# Patient Record
Sex: Male | Born: 1950 | Race: Black or African American | Hispanic: No | Marital: Married | Smoking: Never smoker
Health system: Southern US, Community
[De-identification: ages and names within clinical notes are randomized; demographics above are authoritative.]

## PROBLEM LIST (undated history)

## (undated) DIAGNOSIS — Z8781 Personal history of (healed) traumatic fracture: Secondary | ICD-10-CM

## (undated) DIAGNOSIS — Q446 Cystic disease of liver: Secondary | ICD-10-CM

## (undated) DIAGNOSIS — N281 Cyst of kidney, acquired: Secondary | ICD-10-CM

## (undated) DIAGNOSIS — E785 Hyperlipidemia, unspecified: Secondary | ICD-10-CM

## (undated) DIAGNOSIS — Z8669 Personal history of other diseases of the nervous system and sense organs: Secondary | ICD-10-CM

## (undated) DIAGNOSIS — J61 Pneumoconiosis due to asbestos and other mineral fibers: Secondary | ICD-10-CM

## (undated) DIAGNOSIS — M1990 Unspecified osteoarthritis, unspecified site: Secondary | ICD-10-CM

## (undated) DIAGNOSIS — N400 Enlarged prostate without lower urinary tract symptoms: Secondary | ICD-10-CM

## (undated) DIAGNOSIS — G629 Polyneuropathy, unspecified: Secondary | ICD-10-CM

## (undated) DIAGNOSIS — R519 Headache, unspecified: Secondary | ICD-10-CM

## (undated) DIAGNOSIS — C9000 Multiple myeloma not having achieved remission: Secondary | ICD-10-CM

## (undated) DIAGNOSIS — I2699 Other pulmonary embolism without acute cor pulmonale: Secondary | ICD-10-CM

## (undated) DIAGNOSIS — C61 Malignant neoplasm of prostate: Secondary | ICD-10-CM

## (undated) DIAGNOSIS — R51 Headache: Secondary | ICD-10-CM

## (undated) HISTORY — DX: Personal history of other diseases of the nervous system and sense organs: Z86.69

## (undated) HISTORY — DX: Multiple myeloma not having achieved remission: C90.00

## (undated) HISTORY — DX: Hyperlipidemia, unspecified: E78.5

## (undated) HISTORY — DX: Other pulmonary embolism without acute cor pulmonale: I26.99

## (undated) HISTORY — PX: JOINT REPLACEMENT: SHX530

## (undated) HISTORY — DX: Polyneuropathy, unspecified: G62.9

## (undated) HISTORY — DX: Pneumoconiosis due to asbestos and other mineral fibers: J61

## (undated) HISTORY — DX: Benign prostatic hyperplasia without lower urinary tract symptoms: N40.0

## (undated) HISTORY — DX: Malignant neoplasm of prostate: C61

## (undated) HISTORY — DX: Cystic disease of liver: Q44.6

## (undated) HISTORY — DX: Personal history of (healed) traumatic fracture: Z87.81

## (undated) HISTORY — PX: BIOPSY PROSTATE: PRO28

---

## 2002-07-28 ENCOUNTER — Encounter: Payer: Self-pay | Admitting: Internal Medicine

## 2002-07-28 ENCOUNTER — Ambulatory Visit (HOSPITAL_COMMUNITY): Admission: RE | Admit: 2002-07-28 | Discharge: 2002-07-28 | Payer: Self-pay | Admitting: Internal Medicine

## 2003-10-24 ENCOUNTER — Ambulatory Visit (HOSPITAL_COMMUNITY): Admission: RE | Admit: 2003-10-24 | Discharge: 2003-10-24 | Payer: Self-pay | Admitting: Pulmonary Disease

## 2004-03-02 ENCOUNTER — Emergency Department (HOSPITAL_COMMUNITY): Admission: EM | Admit: 2004-03-02 | Discharge: 2004-03-02 | Payer: Self-pay | Admitting: Emergency Medicine

## 2004-07-31 ENCOUNTER — Ambulatory Visit (HOSPITAL_COMMUNITY): Admission: RE | Admit: 2004-07-31 | Discharge: 2004-07-31 | Payer: Self-pay | Admitting: Family Medicine

## 2004-10-29 ENCOUNTER — Ambulatory Visit (HOSPITAL_COMMUNITY): Admission: RE | Admit: 2004-10-29 | Discharge: 2004-10-29 | Payer: Self-pay | Admitting: Family Medicine

## 2004-12-09 ENCOUNTER — Other Ambulatory Visit: Admission: RE | Admit: 2004-12-09 | Discharge: 2004-12-09 | Payer: Self-pay | Admitting: Urology

## 2007-04-19 ENCOUNTER — Encounter (INDEPENDENT_AMBULATORY_CARE_PROVIDER_SITE_OTHER): Payer: Self-pay | Admitting: Specialist

## 2007-10-28 ENCOUNTER — Ambulatory Visit (HOSPITAL_COMMUNITY): Admission: RE | Admit: 2007-10-28 | Discharge: 2007-10-28 | Payer: Self-pay | Admitting: Family Medicine

## 2008-03-06 ENCOUNTER — Ambulatory Visit (HOSPITAL_COMMUNITY): Admission: RE | Admit: 2008-03-06 | Discharge: 2008-03-06 | Payer: Self-pay | Admitting: Otolaryngology

## 2008-11-13 ENCOUNTER — Ambulatory Visit (HOSPITAL_COMMUNITY): Admission: RE | Admit: 2008-11-13 | Discharge: 2008-11-13 | Payer: Self-pay | Admitting: Family Medicine

## 2008-11-28 ENCOUNTER — Encounter: Payer: Self-pay | Admitting: Internal Medicine

## 2008-11-28 ENCOUNTER — Ambulatory Visit (HOSPITAL_COMMUNITY): Admission: RE | Admit: 2008-11-28 | Discharge: 2008-11-28 | Payer: Self-pay | Admitting: Internal Medicine

## 2008-11-28 ENCOUNTER — Ambulatory Visit: Payer: Self-pay | Admitting: Internal Medicine

## 2008-11-28 HISTORY — PX: COLONOSCOPY: SHX174

## 2011-05-06 NOTE — Op Note (Signed)
Luis, Stewart           ACCOUNT NO.:  [id number]   MEDICAL RECORD NO.:  [id number]          PATIENT TYPE:  AMB   LOCATION:  DAY                           FACILITY:  [facility]   PHYSICIAN:  R. Roetta Sessions, M.D. DATE OF BIRTH:  10-Aug-1951   DATE OF PROCEDURE:  11/28/2008  DATE OF DISCHARGE:                               OPERATIVE REPORT   PROCEDURE:  Colonoscopy with biopsy, snare polypectomy.   INDICATIONS FOR PROCEDURE:  A 60 year old gentleman referred by Dr.  Butch Penny for colorectal cancer screening.  He had a colonoscopy in  1999 for iron-deficiency anemia by Dr. Karilyn Cota.  He is found to have a  tortuous elongated colon.  Colonoscopy is now being done.  Risks,  benefits, alternatives, and limitations have been reviewed, questions  answered.  Please see the documentation in the medical record.  As noted  above, Luis Stewart is notable for lower GI tract symptoms.  Currently,  there is no family history of colonic polyps or colon cancer.   PROCEDURE NOTE:  O2 saturation, blood pressure, pulse, and respirations  monitored throughout the entire procedure.   CONSCIOUS SEDATION:  Versed 5 mg IV, Demerol 100 mg IV in divided doses.   INSTRUMENT:  Pentax video chip system.   FINDINGS:  Digital rectal exam revealed no abnormalities. Endoscopic  findings:  The prep was good.  Colon:  The colonic mucosa was surveyed  from the rectosigmoid junction through the left transverse, right colon  to the appendiceal orifice, ileocecal valve, and cecum.  These  structures were well seen and photographed for the record.  From this  level, scope was slowly and cautiously withdrawn.  All previously  mentioned mucosal surfaces were again seen.  The patient is noted to  have a diminutive polyp in mid ascending colon and was cold  biopsy/removed.  Adjacent to this polyp, there was 1-cm pedunculated  polyp, which was hot snared with one pass and recovered.  The remainder  of the colonic  mucosa appeared normal, although the colonoscope was  somewhat elongated and tortuous.  Otherwise, it appeared normal.  Scope  was pulled down the rectum where thorough examination of the rectal  mucosa including retroflexed view of the anal verge demonstrated some  internal hemorrhoids only.  The patient tolerated the procedure well and  was reactive to Endoscopy.   IMPRESSION:  Internal hemorrhoids, otherwise normal rectum, somewhat  tortuous colon, ascending colon polyps removed as described above.  Remainder of colonic mucosa appeared unremarkable.   RECOMMENDATIONS:  1. No aspirin or arthritis medications for 5 days.  2. Followup on path.  3. Further recommendations to follow.      Luis Stewart, M.D.  Electronically Signed     RMR/MEDQ  D:  11/28/2008  T:  11/28/2008  Job:  [id number]   cc:   Angus G. Renard Matter, MD  Fax: [phone]

## 2011-05-09 NOTE — Procedures (Signed)
   NAME:  Luis Stewart, Luis Stewart                     ACCOUNT NO.:  [id number]   MEDICAL RECORD NO.:  [id number]                   PATIENT TYPE:  OUT   LOCATION:  DFTL                                 FACILITY:  [facility]   PHYSICIAN:  Edward L. Juanetta Gosling, M.D.             DATE OF BIRTH:  August 16, 1951   DATE OF PROCEDURE:  10/24/2003  DATE OF DISCHARGE:                                    STRESS TEST   INDICATIONS FOR PROCEDURE:  Mr. Ruscitti is undergoing graded exercise  testing because he did have some chest discomfort.  He also was undergoing  routine physical when he discussed this with Dr. Renard Matter.  Dr. Renard Matter set  him up for a stress test.  There are no contraindications to graded exercise  testing.   Mr. Reisig exercised for nine minutes on the Bruce protocol, reaching and  sustaining 10.1 METs.  His maximum recorded heart rate was 156 which is 93%  of his age-predicted maximal heart rate.  He had no symptoms during exercise  and had no electrocardiographic changes suggestive of inducible ischemia.  His blood pressure response to exercise was normal.   IMPRESSION:  1. Good exercise tolerance.  2. No evidence of inducible ischemia.  3. Normal blood pressure response to exercise.  4. No symptoms with exercise.      ___________________________________________                                            Oneal Deputy. Juanetta Gosling, M.D.   ELH/MEDQ  D:  10/24/2003  T:  10/24/2003  Job:  [id number]   cc:   Angus G. Renard Matter, M.D.  [address]  [place]  [place] [address]  Fax: [phone]

## 2012-12-03 ENCOUNTER — Other Ambulatory Visit (HOSPITAL_COMMUNITY): Payer: Self-pay | Admitting: Family Medicine

## 2012-12-03 ENCOUNTER — Ambulatory Visit (HOSPITAL_COMMUNITY)
Admission: RE | Admit: 2012-12-03 | Discharge: 2012-12-03 | Disposition: A | Discharge status: Home / Self Care | Payer: Self-pay | Source: Ambulatory Visit | Attending: Family Medicine | Admitting: Family Medicine

## 2012-12-03 DIAGNOSIS — R0789 Other chest pain: Secondary | ICD-10-CM

## 2012-12-03 DIAGNOSIS — R079 Chest pain, unspecified: Secondary | ICD-10-CM | POA: Insufficient documentation

## 2013-05-18 ENCOUNTER — Telehealth: Payer: Self-pay | Admitting: Family Medicine

## 2013-05-26 NOTE — Telephone Encounter (Signed)
Pt going to do labs day of appt according to appt notes

## 2013-05-30 ENCOUNTER — Encounter: Payer: Self-pay | Admitting: Family Medicine

## 2013-05-30 ENCOUNTER — Ambulatory Visit (INDEPENDENT_AMBULATORY_CARE_PROVIDER_SITE_OTHER): Payer: BC Managed Care – PPO | Admitting: Family Medicine

## 2013-05-30 VITALS — BP 120/76 | HR 84 | Temp 97.8°F | Resp 16 | Ht 72.5 in | Wt 185.0 lb

## 2013-05-30 DIAGNOSIS — D72819 Decreased white blood cell count, unspecified: Secondary | ICD-10-CM

## 2013-05-30 DIAGNOSIS — E785 Hyperlipidemia, unspecified: Secondary | ICD-10-CM

## 2013-05-30 DIAGNOSIS — Z2911 Encounter for prophylactic immunotherapy for respiratory syncytial virus (RSV): Secondary | ICD-10-CM

## 2013-05-30 DIAGNOSIS — Z0000 Encounter for general adult medical examination without abnormal findings: Secondary | ICD-10-CM

## 2013-05-30 DIAGNOSIS — N400 Enlarged prostate without lower urinary tract symptoms: Secondary | ICD-10-CM

## 2013-05-30 DIAGNOSIS — Z23 Encounter for immunization: Secondary | ICD-10-CM

## 2013-05-30 DIAGNOSIS — R972 Elevated prostate specific antigen [PSA]: Secondary | ICD-10-CM

## 2013-05-30 LAB — CBC
Hemoglobin: 15.1 g/dL (ref 13.0–17.0)
MCH: 30.4 pg (ref 26.0–34.0)
MCHC: 34.9 g/dL (ref 30.0–36.0)
MCV: 87.1 fL (ref 78.0–100.0)
Platelets: 149 10*3/uL — ABNORMAL LOW (ref 150–400)
RDW: 15.2 % (ref 11.5–15.5)

## 2013-05-30 LAB — LIPID PANEL
Cholesterol: 197 mg/dL (ref 0–200)
LDL Cholesterol: 133 mg/dL — ABNORMAL HIGH (ref 0–99)
Total CHOL/HDL Ratio: 3.9 Ratio
Triglycerides: 66 mg/dL (ref <150)
VLDL: 13 mg/dL (ref 0–40)

## 2013-05-30 LAB — COMPREHENSIVE METABOLIC PANEL
ALT: 11 U/L (ref 0–53)
AST: 17 U/L (ref 0–37)
Albumin: 4.2 g/dL (ref 3.5–5.2)
BUN: 17 mg/dL (ref 6–23)
Calcium: 9.5 mg/dL (ref 8.4–10.5)
Chloride: 106 mEq/L (ref 96–112)
Creat: 1.22 mg/dL (ref 0.50–1.35)
Potassium: 5 mEq/L (ref 3.5–5.3)
Sodium: 144 mEq/L (ref 135–145)
Total Bilirubin: 0.6 mg/dL (ref 0.3–1.2)
Total Protein: 7 g/dL (ref 6.0–8.3)

## 2013-05-30 NOTE — Patient Instructions (Addendum)
I recommend eye visit once a year I recommend dental visit every 6 months Goal is to  Exercise 30 minutes 5 days a week We will call with lab results  Labs done today  Shingles vaccine Recommend Multivitamin for men F/U 1 year

## 2013-06-01 ENCOUNTER — Encounter: Payer: Self-pay | Admitting: Family Medicine

## 2013-06-01 DIAGNOSIS — Z01818 Encounter for other preprocedural examination: Secondary | ICD-10-CM | POA: Insufficient documentation

## 2013-06-01 DIAGNOSIS — Z0001 Encounter for general adult medical examination with abnormal findings: Secondary | ICD-10-CM | POA: Insufficient documentation

## 2013-06-01 DIAGNOSIS — E785 Hyperlipidemia, unspecified: Secondary | ICD-10-CM | POA: Insufficient documentation

## 2013-06-01 DIAGNOSIS — Z0000 Encounter for general adult medical examination without abnormal findings: Secondary | ICD-10-CM | POA: Insufficient documentation

## 2013-06-01 DIAGNOSIS — N400 Enlarged prostate without lower urinary tract symptoms: Secondary | ICD-10-CM | POA: Insufficient documentation

## 2013-06-01 NOTE — Assessment & Plan Note (Addendum)
Complete physical done today. Shingles vaccine given other immunizations up-to-date. He's had a colonoscopy Recommend MVI with calcium and Vit D Increase exercise See instructions

## 2013-06-01 NOTE — Progress Notes (Signed)
  Subjective:    Patient ID: Luis Stewart, male    DOB: 1951-01-24, 62 y.o.   MRN: [id number]  HPI  Patient here to establish care. Previous PCP Dr. Megan Mans. Urologist - Dr. Jerre Simon, [facility] Medications and history reviewed He is a history of BPH he is currently being treated by urology he also has had quite elevated PSA measurements he's had prostate biopsies there is no sign of cancer however his PSA stay elevated around 12. He also has a history of mild hyperlipidemia he was on medications in the past for the past couple of years has not taken anything. He also has a history of neuropathy in his feet. He was followed by Dr. Gerilyn Pilgrim neurology he's had nerve conduction studies done and has been tried on medications but nothing has helped. The past 2 years he has not used anything His last physical exam and routine labs were done in 2011- He did not have insurance past 2 years  Currently works part time in business for a  [employer] Previously worked at [employer] reviewed he's not had the zoster vaccine Colonoscopy is up to date  Followed by Education officer, community No eye insurance   Review of Systems  GEN- denies fatigue, fever, weight loss,weakness, recent illness HEENT- denies eye drainage, change in vision, nasal discharge, CVS- denies chest pain, palpitations RESP- denies SOB, cough, wheeze ABD- denies N/V, change in stools, abd pain GU- denies dysuria, hematuria, dribbling, incontinence MSK- denies joint pain, muscle aches, injury Neuro- denies headache, dizziness, syncope, seizure activity      Objective:   Physical Exam GEN- NAD, alert and oriented x3 HEENT- PERRL, EOMI, non injected sclera, pink conjunctiva, MMM, oropharynx clear Neck- Supple,no LAD CVS- RRR, no murmur RESP-CTAB ABD-NABS,soft,NT,ND EXT- No edema GU- defferred  Pulses- Radial, DP- 2+ Psych- normal affect and mood        Assessment & Plan:

## 2013-06-01 NOTE — Assessment & Plan Note (Signed)
Followed by urology. Records obtained. No current medication

## 2013-06-01 NOTE — Assessment & Plan Note (Signed)
He's had elevated PSA for many years now ranging from 12-16. He will continue to follow with urology every 3-4 months

## 2013-06-01 NOTE — Assessment & Plan Note (Signed)
Check fasting lipid panel, current medication

## 2013-06-03 ENCOUNTER — Other Ambulatory Visit (INDEPENDENT_AMBULATORY_CARE_PROVIDER_SITE_OTHER): Payer: Self-pay

## 2013-06-03 DIAGNOSIS — D72819 Decreased white blood cell count, unspecified: Secondary | ICD-10-CM

## 2013-06-03 NOTE — Addendum Note (Signed)
Addended by: Elvina Mattes T on: 06/03/2013 08:46 AM   Modules accepted: Orders

## 2013-06-04 LAB — CBC WITH DIFFERENTIAL/PLATELET
Basophils Absolute: 0 10*3/uL (ref 0.0–0.1)
Basophils Relative: 1 % (ref 0–1)
Eosinophils Relative: 2 % (ref 0–5)
HCT: 41.1 % (ref 39.0–52.0)
Hemoglobin: 14 g/dL (ref 13.0–17.0)
Lymphocytes Relative: 38 % (ref 12–46)
Lymphs Abs: 1.2 10*3/uL (ref 0.7–4.0)
MCH: 29.2 pg (ref 26.0–34.0)
MCHC: 34.1 g/dL (ref 30.0–36.0)
MCV: 85.6 fL (ref 78.0–100.0)
Monocytes Absolute: 0.2 10*3/uL (ref 0.1–1.0)
Monocytes Relative: 6 % (ref 3–12)
Neutro Abs: 1.7 10*3/uL (ref 1.7–7.7)
Neutrophils Relative %: 53 % (ref 43–77)
Platelets: 150 10*3/uL (ref 150–400)
RBC: 4.8 MIL/uL (ref 4.22–5.81)
RDW: 15.4 % (ref 11.5–15.5)
WBC: 3.1 10*3/uL — ABNORMAL LOW (ref 4.0–10.5)

## 2013-06-08 ENCOUNTER — Encounter: Payer: Self-pay | Admitting: Family Medicine

## 2013-06-13 ENCOUNTER — Encounter: Payer: Self-pay | Admitting: Family Medicine

## 2013-06-27 ENCOUNTER — Encounter: Payer: Self-pay | Admitting: Family Medicine

## 2013-10-27 ENCOUNTER — Other Ambulatory Visit: Payer: Self-pay

## 2013-11-22 ENCOUNTER — Encounter: Payer: Self-pay | Admitting: Internal Medicine

## 2013-12-08 ENCOUNTER — Ambulatory Visit (INDEPENDENT_AMBULATORY_CARE_PROVIDER_SITE_OTHER): Payer: BC Managed Care – PPO | Admitting: Physician Assistant

## 2013-12-08 ENCOUNTER — Encounter: Payer: Self-pay | Admitting: Physician Assistant

## 2013-12-08 VITALS — BP 114/70 | HR 68 | Temp 98.7°F | Resp 18 | Wt 181.0 lb

## 2013-12-08 DIAGNOSIS — B9789 Other viral agents as the cause of diseases classified elsewhere: Secondary | ICD-10-CM

## 2013-12-08 NOTE — Progress Notes (Signed)
    Patient ID: Luis Stewart MRN: [id number], DOB: [birth year], 62 y.o. Date of Encounter: 12/08/2013, 8:53 AM    Chief Complaint:  Chief Complaint  Patient presents with  . c/o sinus infection    congestion, achy     HPI: 62 y.o. year old AA male says that his symptoms just started on the night of 12/06/13. He points to the bridge of his nose and says that the area started to feel sore and he also had some sore throat. Says that his throat was quite sore on the night of 12/06/13 and even yesterday morning. However he says that since then the sore throat has gotten much better. He has also had some congestion. However he says that what he is blowing out of his nose is clear. Thinks he has some chest congestion with some cough. Has had no fevers or chills. He's been using some Mucinex. No known sick contacts.     Home Meds: See attached medication section for any medications that were entered at today's visit. The computer does not put those onto this list.The following list is a list of meds entered prior to today's visit.   No current outpatient prescriptions on file prior to visit.   No current facility-administered medications on file prior to visit.    Allergies: No Known Allergies    Review of Systems: See HPI for pertinent ROS. All other ROS negative.    Physical Exam: Blood pressure 114/70, pulse 68, temperature 98.7 F (37.1 C), temperature source Oral, resp. rate 18, weight 181 lb (82.101 kg)., Body mass index is 24.2 kg/(m^2). General: WNWD AAM. Appears in no acute distress. HEENT: Normocephalic, atraumatic, eyes without discharge, sclera non-icteric, nares are without discharge. Bilateral auditory canals clear, TM's are without perforation, pearly grey and translucent with reflective cone of light bilaterally. Oral cavity moist, posterior pharynx without exudate, erythema, peritonsillar abscess, or post nasal drip. No tenderness with percussion of the  sinuses.  Neck: Supple. No thyromegaly. No lymphadenopathy. Lungs: Clear bilaterally to auscultation without wheezes, rales, or rhonchi. Breathing is unlabored. Heart: Regular rhythm. No murmurs, rubs, or gallops. Msk:  Strength and tone normal for age. Extremities/Skin: Warm and dry. No clubbing or cyanosis. No edema. No rashes or suspicious lesions. Neuro: Alert and oriented X 3. Moves all extremities spontaneously. Gait is normal. CNII-XII grossly in tact. Psych:  Responds to questions appropriately with a normal affect.     ASSESSMENT AND PLAN:  62 y.o. year old male with  1. Viral respiratory infection Recommend over-the-counter decongestants and cough medications for symptomatic relief. Hopefully this should run its course and resolve on its own. If he develops significant fever or if the symptoms worsen significantly or persist greater than 7 days without resolution, then call us to f/u.   Signed, [address] [place], [place], [facility] 12/08/2013 8:53 AM

## 2013-12-22 DIAGNOSIS — C61 Malignant neoplasm of prostate: Secondary | ICD-10-CM

## 2013-12-22 HISTORY — DX: Malignant neoplasm of prostate: C61

## 2014-03-07 LAB — PULMONARY FUNCTION TEST

## 2014-06-01 ENCOUNTER — Other Ambulatory Visit: Payer: BC Managed Care – PPO

## 2014-06-01 ENCOUNTER — Other Ambulatory Visit: Payer: Self-pay | Admitting: Family Medicine

## 2014-06-01 DIAGNOSIS — Z79899 Other long term (current) drug therapy: Secondary | ICD-10-CM

## 2014-06-01 DIAGNOSIS — N400 Enlarged prostate without lower urinary tract symptoms: Secondary | ICD-10-CM

## 2014-06-01 DIAGNOSIS — Z0000 Encounter for general adult medical examination without abnormal findings: Secondary | ICD-10-CM

## 2014-06-01 DIAGNOSIS — E785 Hyperlipidemia, unspecified: Secondary | ICD-10-CM

## 2014-06-01 DIAGNOSIS — R972 Elevated prostate specific antigen [PSA]: Principal | ICD-10-CM

## 2014-06-01 LAB — COMPLETE METABOLIC PANEL WITH GFR
ALT: 13 U/L (ref 0–53)
AST: 19 U/L (ref 0–37)
Albumin: 3.9 g/dL (ref 3.5–5.2)
Alkaline Phosphatase: 98 U/L (ref 39–117)
BILIRUBIN TOTAL: 0.4 mg/dL (ref 0.2–1.2)
BUN: 14 mg/dL (ref 6–23)
CALCIUM: 9 mg/dL (ref 8.4–10.5)
CHLORIDE: 104 meq/L (ref 96–112)
CO2: 29 mEq/L (ref 19–32)
Creat: 1.11 mg/dL (ref 0.50–1.35)
GFR, Est African American: 81 mL/min
GFR, Est Non African American: 70 mL/min
Glucose, Bld: 92 mg/dL (ref 70–99)
Potassium: 4.3 mEq/L (ref 3.5–5.3)
Sodium: 142 mEq/L (ref 135–145)
Total Protein: 6.4 g/dL (ref 6.0–8.3)

## 2014-06-01 LAB — CBC WITH DIFFERENTIAL/PLATELET
Basophils Absolute: 0 10*3/uL (ref 0.0–0.1)
Basophils Relative: 1 % (ref 0–1)
Eosinophils Absolute: 0.1 10*3/uL (ref 0.0–0.7)
Eosinophils Relative: 4 % (ref 0–5)
HCT: 41.8 % (ref 39.0–52.0)
Hemoglobin: 14.6 g/dL (ref 13.0–17.0)
Lymphocytes Relative: 45 % (ref 12–46)
Lymphs Abs: 1.5 10*3/uL (ref 0.7–4.0)
MCH: 30.4 pg (ref 26.0–34.0)
MCHC: 34.9 g/dL (ref 30.0–36.0)
MCV: 87.1 fL (ref 78.0–100.0)
Monocytes Absolute: 0.2 10*3/uL (ref 0.1–1.0)
Monocytes Relative: 7 % (ref 3–12)
Neutro Abs: 1.5 10*3/uL — ABNORMAL LOW (ref 1.7–7.7)
Neutrophils Relative %: 43 % (ref 43–77)
PLATELETS: 164 10*3/uL (ref 150–400)
RBC: 4.8 MIL/uL (ref 4.22–5.81)
RDW: 15.3 % (ref 11.5–15.5)
WBC: 3.4 10*3/uL — ABNORMAL LOW (ref 4.0–10.5)

## 2014-06-01 LAB — LIPID PANEL
Cholesterol: 190 mg/dL (ref 0–200)
HDL: 44 mg/dL (ref >39)
LDL Cholesterol: 123 mg/dL — ABNORMAL HIGH (ref 0–99)
Total CHOL/HDL Ratio: 4.3 Ratio
Triglycerides: 113 mg/dL (ref <150)
VLDL: 23 mg/dL (ref 0–40)

## 2014-06-01 LAB — TSH: TSH: 4.672 u[IU]/mL — ABNORMAL HIGH (ref 0.350–4.500)

## 2014-06-02 LAB — T3: T3, Total: 139.8 ng/dL (ref 80.0–204.0)

## 2014-06-02 LAB — VITAMIN D 25 HYDROXY (VIT D DEFICIENCY, FRACTURES): Vit D, 25-Hydroxy: 26 ng/mL — ABNORMAL LOW (ref 30–89)

## 2014-06-02 LAB — T4, FREE: Free T4: 1.07 ng/dL (ref 0.80–1.80)

## 2014-06-05 ENCOUNTER — Ambulatory Visit (INDEPENDENT_AMBULATORY_CARE_PROVIDER_SITE_OTHER): Payer: BC Managed Care – PPO | Admitting: Family Medicine

## 2014-06-05 ENCOUNTER — Encounter: Payer: Self-pay | Admitting: Family Medicine

## 2014-06-05 VITALS — BP 132/78 | HR 60 | Temp 97.8°F | Resp 12 | Ht 74.0 in | Wt 186.0 lb

## 2014-06-05 DIAGNOSIS — E785 Hyperlipidemia, unspecified: Secondary | ICD-10-CM

## 2014-06-05 DIAGNOSIS — D126 Benign neoplasm of colon, unspecified: Secondary | ICD-10-CM

## 2014-06-05 DIAGNOSIS — E559 Vitamin D deficiency, unspecified: Secondary | ICD-10-CM

## 2014-06-05 DIAGNOSIS — N400 Enlarged prostate without lower urinary tract symptoms: Secondary | ICD-10-CM

## 2014-06-05 DIAGNOSIS — R972 Elevated prostate specific antigen [PSA]: Secondary | ICD-10-CM

## 2014-06-05 DIAGNOSIS — K635 Polyp of colon: Secondary | ICD-10-CM

## 2014-06-05 DIAGNOSIS — Z1211 Encounter for screening for malignant neoplasm of colon: Secondary | ICD-10-CM

## 2014-06-05 DIAGNOSIS — Z0000 Encounter for general adult medical examination without abnormal findings: Secondary | ICD-10-CM

## 2014-06-05 DIAGNOSIS — M2550 Pain in unspecified joint: Secondary | ICD-10-CM

## 2014-06-05 MED ORDER — VITAMIN D (ERGOCALCIFEROL) 1.25 MG (50000 UNIT) PO CAPS: 50000.0000 [IU] | ORAL_CAPSULE | ORAL | Status: DC

## 2014-06-05 NOTE — Assessment & Plan Note (Signed)
He has a mild migrating joint pain which is recent. We discussed trying oral NSAIDs however he refers to tried topical first. If his joint pains do not improve I would image him get a rheumatoid factor

## 2014-06-05 NOTE — Assessment & Plan Note (Signed)
Enlarged prostate and elevated PSA however no urinary symptoms accompanying therefore we will not start any medications

## 2014-06-05 NOTE — Assessment & Plan Note (Signed)
ERGOCALCIFEROL  X 12 WEEKS

## 2014-06-05 NOTE — Assessment & Plan Note (Signed)
Lipids look good, no meds needed

## 2014-06-05 NOTE — Patient Instructions (Signed)
Referral for colonoscopy Vitamin D for 12 weeks, then resume your multivitamin, make sure at least Vitamin D 1000iu  We will call about the Thyroid levels  Try aspercreme for the joints or biofreeze  Call if not improved I recommend eye visit once a year I recommend dental visit every 6 months Goal is to  Exercise 30 minutes 5 days a week F/U as needed or in 1 year

## 2014-06-05 NOTE — Assessment & Plan Note (Signed)
Physical done. Refer for repeat colonoscopy Fasting labs reviewed at bedside Schedule ophthalmology visit

## 2014-06-05 NOTE — Progress Notes (Signed)
Patient ID: Luis Stewart, male   DOB: Sep 03, 1951, 63 y.o.   MRN: [id number]   Subjective:    Patient ID: Luis Stewart, male    DOB: 10-Sep-1951, 63 y.o.   MRN: [id number]  Patient presents for CPE and Discuss Referrals  patient here for physical exam. He was seen by pulmonology secondary to an abnormal chest x-ray as he is currently under surveillance for spasticity to some exposure with his previous job. His chest x-ray was concerning for interstitial markings and signs of asbestos he then had pulmonary function tests which showed decreased diffusion capacity there was concern for asbestos in him. He will have chest x-ray yearly to monitor for any signs of mesothelioma and will follow pulmonary closely. There were no medication changes.  Colonoscopy had a colonoscopy done in December 2009 which had some polyps he is due for repeat colonoscopy.  Elevated PSA he's been followed by urology over the past 10 years secondary to elevated PSA he did have a biopsy done in 2005 which was benign he was recently seen about 3 months ago.  He also complains of migrating joint pain. He's had bilateral knee and leg pain for many years he was seen by neurology for this told that he had a nonspecific nerve damage. He also gets pain in his neck from time to time in both films. He's had some mild swelling in his thumbs he did not take any over-the-counter medication and the pain to resolve on her own.    Review Of Systems:  GEN- denies fatigue, fever, weight loss,weakness, recent illness HEENT- denies eye drainage, change in vision, nasal discharge, CVS- denies chest pain, palpitations RESP- denies SOB, cough, wheeze ABD- denies N/V, change in stools, abd pain GU- denies dysuria, hematuria, dribbling, incontinence MSK- denies joint pain, muscle aches, injury Neuro- denies headache, dizziness, syncope, seizure activity       Objective:    BP 132/78  Pulse 60  Temp(Src) 97.8 F (36.6 C)  (Oral)  Resp 12  Ht 6\' 2"  (1.88 m)  Wt 186 lb (84.369 kg)  BMI 23.87 kg/m2 GEN- NAD, alert and oriented x3 HEENT- PERRL, EOMI, non injected sclera, pink conjunctiva, MMM, oropharynx clear, TM mild wax bilat Neck- Supple, no thyromegaly CVS- RRR, no murmur RESP-CTAB ABD-NABS,soft,NT,ND GU- Normal rectal tone, external skin tag, FOBT 1 speck positive, soft brown stool in vault, enlarged prostate, no nodules MSK- FROM bilat knee,s +crepitus, normal inspection for effusion  EXT- No edema Pulses- Radial, DP- 2+        Assessment & Plan:      Problem List Items Addressed This Visit   Routine general medical examination at a health care facility   Mild hyperlipidemia   Elevated PSA measurement   BPH (benign prostatic hypertrophy) - Primary      Note: This dictation was prepared with Dragon dictation along with smaller phrase technology. Any transcriptional errors that result from this process are unintentional.

## 2014-06-26 ENCOUNTER — Telehealth: Payer: Self-pay | Admitting: General Practice

## 2014-06-26 NOTE — Telephone Encounter (Signed)
Patient received a letter from you and wanted to get set up for his tcs, please call him on his cell phone.  Routing to [employer]

## 2014-06-27 ENCOUNTER — Telehealth: Payer: Self-pay

## 2014-06-27 NOTE — Telephone Encounter (Signed)
Received the colonoscopy report and path of 11/28/2008. Pt did have a tubular adenoma and was on recall for 2014. He needs OV prior to scheduling.

## 2014-06-27 NOTE — Telephone Encounter (Signed)
Returned pt's Call to schedule next colonoscopy.

## 2014-06-27 NOTE — Telephone Encounter (Signed)
See separate triage.  

## 2014-06-27 NOTE — Telephone Encounter (Signed)
Pt said he had colonoscopy in 2014 by Dr. Gala Romney. Nothing in E-chart. Called Erica in Medical Records and she will fax it to me.

## 2014-06-28 NOTE — Telephone Encounter (Signed)
Pt is scheduled for an ov with Laban Emperor, NP on 08/08/2014 at 2:30 PM .

## 2014-08-08 ENCOUNTER — Encounter: Payer: Self-pay | Admitting: Gastroenterology

## 2014-08-08 ENCOUNTER — Ambulatory Visit (INDEPENDENT_AMBULATORY_CARE_PROVIDER_SITE_OTHER): Payer: BC Managed Care – PPO | Admitting: Gastroenterology

## 2014-08-08 VITALS — BP 134/79 | HR 63 | Temp 97.6°F | Ht 74.0 in | Wt 184.6 lb

## 2014-08-08 DIAGNOSIS — Z860100 Personal history of colon polyps, unspecified: Secondary | ICD-10-CM | POA: Insufficient documentation

## 2014-08-08 MED ORDER — PEG 3350-KCL-NA BICARB-NACL 420 G PO SOLR: 4000.0000 mL | ORAL | Status: DC

## 2014-08-08 NOTE — Progress Notes (Signed)
Primary Care Physician:  Vic Blackbird, MD Primary Gastroenterologist:  Dr. Gala Romney  Chief Complaint  Patient presents with  . Colonoscopy    HPI:   Luis Stewart is a very pleasant 63 year old male who presents with need for surveillance colonoscopy due to history of adenomatous colon polyps.   Sometimes lower abdominal discomfort but relieved after defecation. No hematochezia. Has never been regular where he goes every day. Sometimes a tad constipated. Loves cashews, which helps. Declining prescriptive medications. No upper GI symptoms.   Past Medical History  Diagnosis Date  . Enlarged prostate   . Broken bones     lower rt leg- Child  . Hx of migraine headaches   . Neuromuscular disorder     neuropathy  . Hyperlipidemia     borderline  . Asbestosis     Past Surgical History  Procedure Laterality Date  . Biopsy prostate  2003 and 2005  . Colonoscopy  11/28/2008    EUM:PNTIRWER hemorrhoids/tortus colon/ascending colon polyps    Current Outpatient Prescriptions  Medication Sig Dispense Refill  . Multiple Vitamins-Minerals (MULTIVITAMIN PO) Take 1 tablet by mouth daily. Centrum Silver for Men      . Vitamin D, Ergocalciferol, (DRISDOL) 50000 UNITS CAPS capsule Take 1 capsule (50,000 Units total) by mouth every 7 (seven) days.  4 capsule  2   No current facility-administered medications for this visit.    Allergies as of 08/08/2014  . (No Known Allergies)    Family History  Problem Relation Age of Onset  . Diabetes Mother   . Hypertension Mother   . Obesity Mother   . Heart disease Mother   . Mental illness Mother   . Hypertension Sister   . Obesity Sister   . Arthritis Sister   . Deep vein thrombosis Father   . Dementia Father   . Heart disease Father     heart bypass  . Colon cancer Neg Hx     History   Social History  . Marital Status: Married    Spouse Name: N/A    Number of Children: N/A  . Years of Education: N/A    Occupational History  . Not on file.   Social History Main Topics  . Smoking status: Never Smoker   . Smokeless tobacco: Never Used  . Alcohol Use: No  . Drug Use: No  . Sexual Activity: Not on file   Other Topics Concern  . Not on file   Social History Narrative  . No narrative on file    Review of Systems: Gen: see HPI CV: Denies chest pain, heart palpitations, peripheral edema, syncope.  Resp: +DOE GI: see HPI GU : sometimes urinary frequency MS: +joint pain Derm: Denies rash, itching, dry skin Psych: Denies depression, anxiety, memory loss, and confusion Heme: Denies bruising, bleeding, and enlarged lymph nodes.  Physical Exam: BP 134/79  Pulse 63  Temp(Src) 97.6 F (36.4 C) (Oral)  Ht 6\' 2"  (1.88 m)  Wt 184 lb 9.6 oz (83.734 kg)  BMI 23.69 kg/m2 General:   Alert and oriented. Pleasant and cooperative. Well-nourished and well-developed.  Head:  Normocephalic and atraumatic. Eyes:  Without icterus, sclera clear and conjunctiva pink.  Ears:  Normal auditory acuity. Nose:  No deformity, discharge,  or lesions. Mouth:  No deformity or lesions, oral mucosa pink.  Lungs:  Clear to auscultation bilaterally. Heart:  S1, S2 present without murmurs appreciated.  Abdomen:  +BS, soft, non-tender and non-distended. No HSM noted. No  guarding or rebound. No masses appreciated.  Rectal:  Deferred  Msk:  Symmetrical without gross deformities. Normal posture. Extremities:  Without clubbing or edema. Neurologic:  Alert and  oriented x4;  grossly normal neurologically. Skin:  Intact without significant lesions or rashes. Psych:  Alert and cooperative. Normal mood and affect.

## 2014-08-08 NOTE — Patient Instructions (Signed)
We have scheduled you for a colonoscopy with Dr. Gala Romney in the near future.   Please review the high fiber diet handout. You may add supplemental fiber such as Metamucil or Benefiber daily as well.   High-Fiber Diet Fiber is found in fruits, vegetables, and grains. A high-fiber diet encourages the addition of more whole grains, legumes, fruits, and vegetables in your diet. The recommended amount of fiber for adult males is 38 g per day. For adult females, it is 25 g per day. Pregnant and lactating women should get 28 g of fiber per day. If you have a digestive or bowel problem, ask your caregiver for advice before adding high-fiber foods to your diet. Eat a variety of high-fiber foods instead of only a select few type of foods.  PURPOSE  To increase stool bulk.  To make bowel movements more regular to prevent constipation.  To lower cholesterol.  To prevent overeating. WHEN IS THIS DIET USED?  It may be used if you have constipation and hemorrhoids.  It may be used if you have uncomplicated diverticulosis (intestine condition) and irritable bowel syndrome.  It may be used if you need help with weight management.  It may be used if you want to add it to your diet as a protective measure against atherosclerosis, diabetes, and cancer. SOURCES OF FIBER  Whole-grain breads and cereals.  Fruits, such as apples, oranges, bananas, berries, prunes, and pears.  Vegetables, such as green peas, carrots, sweet potatoes, beets, broccoli, cabbage, spinach, and artichokes.  Legumes, such split peas, soy, lentils.  Almonds. FIBER CONTENT IN FOODS Starches and Grains / Dietary Fiber (g)  Cheerios, 1 cup / 3 g  Corn Flakes cereal, 1 cup / 0.7 g  Rice crispy treat cereal, 1 cup / 0.3 g  Instant oatmeal (cooked),  cup / 2 g  Frosted wheat cereal, 1 cup / 5.1 g  Brown, long-grain rice (cooked), 1 cup / 3.5 g  White, long-grain rice (cooked), 1 cup / 0.6 g  Enriched macaroni (cooked), 1  cup / 2.5 g Legumes / Dietary Fiber (g)  Baked beans (canned, plain, or vegetarian),  cup / 5.2 g  Kidney beans (canned),  cup / 6.8 g  Pinto beans (cooked),  cup / 5.5 g Breads and Crackers / Dietary Fiber (g)  Plain or honey graham crackers, 2 squares / 0.7 g  Saltine crackers, 3 squares / 0.3 g  Plain, salted pretzels, 10 pieces / 1.8 g  Whole-wheat bread, 1 slice / 1.9 g  White bread, 1 slice / 0.7 g  Raisin bread, 1 slice / 1.2 g  Plain bagel, 3 oz / 2 g  Flour tortilla, 1 oz / 0.9 g  Corn tortilla, 1 small / 1.5 g  Hamburger or hotdog bun, 1 small / 0.9 g Fruits / Dietary Fiber (g)  Apple with skin, 1 medium / 4.4 g  Sweetened applesauce,  cup / 1.5 g  Banana,  medium / 1.5 g  Grapes, 10 grapes / 0.4 g  Orange, 1 small / 2.3 g  Raisin, 1.5 oz / 1.6 g  Melon, 1 cup / 1.4 g Vegetables / Dietary Fiber (g)  Green beans (canned),  cup / 1.3 g  Carrots (cooked),  cup / 2.3 g  Broccoli (cooked),  cup / 2.8 g  Peas (cooked),  cup / 4.4 g  Mashed potatoes,  cup / 1.6 g  Lettuce, 1 cup / 0.5 g  Corn (canned),  cup / 1.6 g  Tomato,  cup / 1.1 g Document Released: 12/08/2005 Document Revised: 06/08/2012 Document Reviewed: 03/11/2012 [facility] Patient Information 2015 [place], [place]. This information is not intended to replace advice given to you by your health care provider. Make sure you discuss any questions you have with your health care provider.

## 2014-08-11 ENCOUNTER — Encounter: Payer: Self-pay | Admitting: Gastroenterology

## 2014-08-11 NOTE — Assessment & Plan Note (Signed)
63 year old male with history of adenomas, due for surveillance now. Mild constipation noted without concerning signs; declining prescriptive agents. Offered high fiber diet handout.   Proceed with TCS with Dr. Gala Romney in near future: the risks, benefits, and alternatives have been discussed with the patient in detail. The patient states understanding and desires to proceed.

## 2014-08-16 NOTE — Progress Notes (Signed)
cc'd to pcp 

## 2014-08-23 ENCOUNTER — Other Ambulatory Visit: Payer: Self-pay | Admitting: Family Medicine

## 2014-08-24 ENCOUNTER — Encounter (HOSPITAL_COMMUNITY): Payer: Self-pay | Admitting: Pharmacy Technician

## 2014-09-04 ENCOUNTER — Encounter (HOSPITAL_COMMUNITY)
Admission: RE | Disposition: A | Discharge status: Home / Self Care | Payer: Self-pay | Source: Ambulatory Visit | Attending: Internal Medicine

## 2014-09-04 ENCOUNTER — Ambulatory Visit (HOSPITAL_COMMUNITY)
Admission: RE | Admit: 2014-09-04 | Discharge: 2014-09-04 | Disposition: A | Discharge status: Home / Self Care | Payer: BC Managed Care – PPO | Source: Ambulatory Visit | Attending: Internal Medicine | Admitting: Internal Medicine

## 2014-09-04 ENCOUNTER — Encounter (HOSPITAL_COMMUNITY): Payer: Self-pay | Admitting: *Deleted

## 2014-09-04 DIAGNOSIS — Z09 Encounter for follow-up examination after completed treatment for conditions other than malignant neoplasm: Secondary | ICD-10-CM | POA: Insufficient documentation

## 2014-09-04 DIAGNOSIS — Z860100 Personal history of colon polyps, unspecified: Secondary | ICD-10-CM | POA: Insufficient documentation

## 2014-09-04 DIAGNOSIS — E785 Hyperlipidemia, unspecified: Secondary | ICD-10-CM | POA: Diagnosis not present

## 2014-09-04 HISTORY — PX: COLONOSCOPY: SHX5424

## 2014-09-04 SURGERY — COLONOSCOPY
Anesthesia: Moderate Sedation

## 2014-09-04 MED ORDER — SIMETHICONE 40 MG/0.6ML PO SUSP
ORAL | Status: DC | PRN
  Administered 2014-09-04: 09:00:00

## 2014-09-04 MED ORDER — SODIUM CHLORIDE 0.9 % IV SOLN
INTRAVENOUS | Status: DC
  Administered 2014-09-04: 09:00:00 via INTRAVENOUS

## 2014-09-04 MED ORDER — ONDANSETRON HCL 4 MG/2ML IJ SOLN
INTRAMUSCULAR | Status: DC | PRN
  Administered 2014-09-04: 4 mg via INTRAVENOUS

## 2014-09-04 MED ORDER — MIDAZOLAM HCL 5 MG/5ML IJ SOLN
INTRAMUSCULAR | Status: DC | PRN
  Administered 2014-09-04 (×2): 2 mg via INTRAVENOUS

## 2014-09-04 MED ORDER — MEPERIDINE HCL 100 MG/ML IJ SOLN
INTRAMUSCULAR | Status: DC | PRN
  Administered 2014-09-04: 50 mg via INTRAVENOUS
  Administered 2014-09-04: 25 mg via INTRAVENOUS

## 2014-09-04 MED ORDER — MEPERIDINE HCL 100 MG/ML IJ SOLN
INTRAMUSCULAR | Status: AC
  Filled 2014-09-04: qty 2

## 2014-09-04 MED ORDER — ONDANSETRON HCL 4 MG/2ML IJ SOLN
INTRAMUSCULAR | Status: AC
  Filled 2014-09-04: qty 2

## 2014-09-04 MED ORDER — MIDAZOLAM HCL 5 MG/5ML IJ SOLN
INTRAMUSCULAR | Status: AC
  Filled 2014-09-04: qty 10

## 2014-09-04 NOTE — Op Note (Signed)
[facility] [address] [place], [address]   COLONOSCOPY PROCEDURE REPORT  PATIENT: Luis Stewart, Luis Stewart  MR#:         [id number] BIRTHDATE: 10/15/1951 , 89  yrs. old GENDER: Male ENDOSCOPIST: R.  Garfield Cornea, MD FACP FACG REFERRED BY:  Vic Blackbird, M.D. PROCEDURE DATE:  09/04/2014 PROCEDURE:     Surveillance colonoscopy  INDICATIONS: History of colonic adenoma  INFORMED CONSENT:  The risks, benefits, alternatives and imponderables including but not limited to bleeding, perforation as well as the possibility of a missed lesion have been reviewed.  The potential for biopsy, lesion removal, etc. have also been discussed.  Questions have been answered.  All parties agreeable. Please see the history and physical in the medical record for more information.  MEDICATIONS: Versed 4 mg IV and Demerol 75 mg IV in divided doses. Zofran 4 mg IV  DESCRIPTION OF PROCEDURE:  After a digital rectal exam was performed, the EC-[id number] ([id number])  colonoscope was advanced from the anus through the rectum and colon to the area of the cecum, ileocecal valve and appendiceal orifice.  The cecum was deeply intubated.  These structures were well-seen and photographed for the record.  From the level of the cecum and ileocecal valve, the scope was slowly and cautiously withdrawn.  The mucosal surfaces were carefully surveyed utilizing scope tip deflection to facilitate fold flattening as needed.  The scope was pulled down into the rectum where a thorough examination including retroflexion was performed.    FINDINGS:  Adequate preparation. Normal appearing rectal mucosa. Normal  appearing colonic mucosa.  THERAPEUTIC / DIAGNOSTIC MANEUVERS PERFORMED:  None  COMPLICATIONS: none CECAL WITHDRAWAL TIME:  7 minutes  IMPRESSION:  Normal colonoscopy  RECOMMENDATIONS: Repeat surveillance examination 5 years   _______________________________ eSigned:  R. Garfield Cornea, MD FACP  [facility] 09/04/2014 9:59 AM   CC:

## 2014-09-04 NOTE — Interval H&P Note (Signed)
History and Physical Interval Note:  09/04/2014 9:22 AM  Luis Stewart  has presented today for surgery, with the diagnosis of HISTORY OF COLON POLYPS  The various methods of treatment have been discussed with the patient and family. After consideration of risks, benefits and other options for treatment, the patient has consented to  Procedure(s) with comments: COLONOSCOPY (N/A) - 9:30 as a surgical intervention .  The patient's history has been reviewed, patient examined, no change in status, stable for surgery.  I have reviewed the patient's chart and labs.  Questions were answered to the patient's satisfaction.   No change. Colonoscopy per plan.The risks, benefits, limitations, alternatives and imponderables have been reviewed with the patient. Questions have been answered. All parties are agreeable.   Manus Rudd

## 2014-09-04 NOTE — Discharge Instructions (Signed)
°  Colonoscopy Discharge Instructions  Read the instructions outlined below and refer to this sheet in the next few weeks. These discharge instructions provide you with general information on caring for yourself after you leave the hospital. Your doctor may also give you specific instructions. While your treatment has been planned according to the most current medical practices available, unavoidable complications occasionally occur. If you have any problems or questions after discharge, call Dr. Gala Romney at [phone]. ACTIVITY  You may resume your regular activity, but move at a slower pace for the next 24 hours.   Take frequent rest periods for the next 24 hours.   Walking will help get rid of the air and reduce the bloated feeling in your belly (abdomen).   No driving for 24 hours (because of the medicine (anesthesia) used during the test).    Do not sign any important legal documents or operate any machinery for 24 hours (because of the anesthesia used during the test).  NUTRITION  Drink plenty of fluids.   You may resume your normal diet as instructed by your doctor.   Begin with a light meal and progress to your normal diet. Heavy or fried foods are harder to digest and may make you feel sick to your stomach (nauseated).   Avoid alcoholic beverages for 24 hours or as instructed.  MEDICATIONS  You may resume your normal medications unless your doctor tells you otherwise.  WHAT YOU CAN EXPECT TODAY  Some feelings of bloating in the abdomen.   Passage of more gas than usual.   Spotting of blood in your stool or on the toilet paper.  IF YOU HAD POLYPS REMOVED DURING THE COLONOSCOPY:  No aspirin products for 7 days or as instructed.   No alcohol for 7 days or as instructed.   Eat a soft diet for the next 24 hours.  FINDING OUT THE RESULTS OF YOUR TEST Not all test results are available during your visit. If your test results are not back during the visit, make an appointment  with your caregiver to find out the results. Do not assume everything is normal if you have not heard from your caregiver or the medical facility. It is important for you to follow up on all of your test results.  SEEK IMMEDIATE MEDICAL ATTENTION IF:  You have more than a spotting of blood in your stool.   Your belly is swollen (abdominal distention).   You are nauseated or vomiting.   You have a temperature over 101.   You have abdominal pain or discomfort that is severe or gets worse throughout the day.     Your colonoscopy was normal today  I recommend you come back for another colonoscopy in 5 years

## 2014-09-04 NOTE — H&P (View-Only) (Signed)
Primary Care Physician:  Vic Blackbird, MD Primary Gastroenterologist:  Dr. Gala Romney  Chief Complaint  Patient presents with  . Colonoscopy    HPI:   Luis Stewart is a very pleasant 63 year old male who presents with need for surveillance colonoscopy due to history of adenomatous colon polyps.   Sometimes lower abdominal discomfort but relieved after defecation. No hematochezia. Has never been regular where he goes every day. Sometimes a tad constipated. Loves cashews, which helps. Declining prescriptive medications. No upper GI symptoms.   Past Medical History  Diagnosis Date  . Enlarged prostate   . Broken bones     lower rt leg- Child  . Hx of migraine headaches   . Neuromuscular disorder     neuropathy  . Hyperlipidemia     borderline  . Asbestosis     Past Surgical History  Procedure Laterality Date  . Biopsy prostate  2003 and 2005  . Colonoscopy  11/28/2008    SWN:IOEVOJJK hemorrhoids/tortus colon/ascending colon polyps    Current Outpatient Prescriptions  Medication Sig Dispense Refill  . Multiple Vitamins-Minerals (MULTIVITAMIN PO) Take 1 tablet by mouth daily. Centrum Silver for Men      . Vitamin D, Ergocalciferol, (DRISDOL) 50000 UNITS CAPS capsule Take 1 capsule (50,000 Units total) by mouth every 7 (seven) days.  4 capsule  2   No current facility-administered medications for this visit.    Allergies as of 08/08/2014  . (No Known Allergies)    Family History  Problem Relation Age of Onset  . Diabetes Mother   . Hypertension Mother   . Obesity Mother   . Heart disease Mother   . Mental illness Mother   . Hypertension Sister   . Obesity Sister   . Arthritis Sister   . Deep vein thrombosis Father   . Dementia Father   . Heart disease Father     heart bypass  . Colon cancer Neg Hx     History   Social History  . Marital Status: Married    Spouse Name: N/A    Number of Children: N/A  . Years of Education: N/A    Occupational History  . Not on file.   Social History Main Topics  . Smoking status: Never Smoker   . Smokeless tobacco: Never Used  . Alcohol Use: No  . Drug Use: No  . Sexual Activity: Not on file   Other Topics Concern  . Not on file   Social History Narrative  . No narrative on file    Review of Systems: Gen: see HPI CV: Denies chest pain, heart palpitations, peripheral edema, syncope.  Resp: +DOE GI: see HPI GU : sometimes urinary frequency MS: +joint pain Derm: Denies rash, itching, dry skin Psych: Denies depression, anxiety, memory loss, and confusion Heme: Denies bruising, bleeding, and enlarged lymph nodes.  Physical Exam: BP 134/79  Pulse 63  Temp(Src) 97.6 F (36.4 C) (Oral)  Ht 6\' 2"  (1.88 m)  Wt 184 lb 9.6 oz (83.734 kg)  BMI 23.69 kg/m2 General:   Alert and oriented. Pleasant and cooperative. Well-nourished and well-developed.  Head:  Normocephalic and atraumatic. Eyes:  Without icterus, sclera clear and conjunctiva pink.  Ears:  Normal auditory acuity. Nose:  No deformity, discharge,  or lesions. Mouth:  No deformity or lesions, oral mucosa pink.  Lungs:  Clear to auscultation bilaterally. Heart:  S1, S2 present without murmurs appreciated.  Abdomen:  +BS, soft, non-tender and non-distended. No HSM noted. No  guarding or rebound. No masses appreciated.  Rectal:  Deferred  Msk:  Symmetrical without gross deformities. Normal posture. Extremities:  Without clubbing or edema. Neurologic:  Alert and  oriented x4;  grossly normal neurologically. Skin:  Intact without significant lesions or rashes. Psych:  Alert and cooperative. Normal mood and affect.

## 2014-09-06 ENCOUNTER — Encounter (HOSPITAL_COMMUNITY): Payer: Self-pay | Admitting: Internal Medicine

## 2014-12-11 ENCOUNTER — Telehealth: Payer: Self-pay | Admitting: Family Medicine

## 2014-12-11 ENCOUNTER — Telehealth: Payer: Self-pay | Admitting: *Deleted

## 2014-12-11 DIAGNOSIS — R972 Elevated prostate specific antigen [PSA]: Secondary | ICD-10-CM

## 2014-12-11 DIAGNOSIS — N400 Enlarged prostate without lower urinary tract symptoms: Secondary | ICD-10-CM

## 2014-12-11 NOTE — Telephone Encounter (Signed)
Referral orders placed.   Call placed to patient and patient made aware.  

## 2014-12-11 NOTE — Telephone Encounter (Signed)
Patient is calling to say that his urologist retired and would like recommendations as to who he should try to see now  [phone]

## 2014-12-11 NOTE — Telephone Encounter (Signed)
Okay to send new referral to [facility]- they come to [place]  Dx- BPH, elevated PSA ( chronic)

## 2014-12-11 NOTE — Telephone Encounter (Signed)
Patient should contact insurance to verify which urologist is covered by his plan.   Call placed to patient to advise. Left message on VM with recommendations.

## 2014-12-11 NOTE — Telephone Encounter (Signed)
Received call from patient.   Reports that he has been seeing Dr. Freda Munro for elevated PSA.   States that urologist has retired and he would like new referral.   Reports that he will e using [employer] as insurance and Dr. Gaynelle Arabian, Dr. Diona Fanti, and Dr. Ricky Ala are in network.   Requested MD to advise.

## 2014-12-20 ENCOUNTER — Telehealth: Payer: Self-pay | Admitting: Family Medicine

## 2014-12-20 NOTE — Telephone Encounter (Signed)
Use nasal saline to help with drainage,  Mucinex nasal congestion Ibuprofen for sore throat Plenty of fluids If not better within a week come in for visit

## 2014-12-20 NOTE — Telephone Encounter (Signed)
Pt aware and will call if worse

## 2014-12-20 NOTE — Telephone Encounter (Signed)
Pt states that he woke up this am with some sinus drainage and is now getting a little ST and was wondering what he could take for it? Has no facial pain or fever and has currently not taken anything for it at all. MD please advise

## 2015-02-27 ENCOUNTER — Ambulatory Visit (INDEPENDENT_AMBULATORY_CARE_PROVIDER_SITE_OTHER): Payer: 59 | Admitting: Urology

## 2015-02-27 DIAGNOSIS — R972 Elevated prostate specific antigen [PSA]: Secondary | ICD-10-CM

## 2015-02-27 DIAGNOSIS — N400 Enlarged prostate without lower urinary tract symptoms: Secondary | ICD-10-CM

## 2015-04-27 ENCOUNTER — Telehealth: Payer: Self-pay | Admitting: Family Medicine

## 2015-04-27 NOTE — Telephone Encounter (Signed)
Patient calling to speak with you regarding a referral  [phone]

## 2015-05-03 NOTE — Telephone Encounter (Signed)
lmtrc

## 2015-05-15 ENCOUNTER — Other Ambulatory Visit: Payer: Self-pay | Admitting: Urology

## 2015-05-15 DIAGNOSIS — R972 Elevated prostate specific antigen [PSA]: Secondary | ICD-10-CM

## 2015-05-29 ENCOUNTER — Inpatient Hospital Stay (HOSPITAL_COMMUNITY): Admission: RE | Admit: 2015-05-29 | Payer: 59 | Source: Ambulatory Visit

## 2015-05-30 ENCOUNTER — Telehealth: Payer: Self-pay | Admitting: *Deleted

## 2015-05-30 NOTE — Telephone Encounter (Signed)
-----   Message from Roney Marion sent at 04/27/2015  3:55 PM EDT ----- Regarding: referral to urology Patient had a referral placed in Dec to see Dr. Diona Fanti at [facility] he states that he called the office and advised [place] that he had new insurance which was [facility] and that he would need a referral put in to them. This is documented in his chart as a telephone call on 12/12/15 He said now he is receiving information from his insurance company saying we never got the referral and they aren't going to pay his claim. Please call patient at [phone] if before 11:30 after that time please call [phone].  Thanks [employer]

## 2015-05-30 NOTE — Telephone Encounter (Signed)
I resubmitted reconsideration claim on pt's behalf for a claim that was denied when referred to [facility] with Dr. Franchot Gallo. Pending reconsideration from [facility].

## 2015-06-12 ENCOUNTER — Ambulatory Visit (HOSPITAL_COMMUNITY)
Admission: RE | Admit: 2015-06-12 | Discharge: 2015-06-12 | Disposition: A | Discharge status: Home / Self Care | Payer: 59 | Source: Ambulatory Visit | Attending: Urology | Admitting: Urology

## 2015-06-12 DIAGNOSIS — R972 Elevated prostate specific antigen [PSA]: Secondary | ICD-10-CM | POA: Diagnosis present

## 2015-06-12 MED ORDER — GENTAMICIN SULFATE 40 MG/ML IJ SOLN
INTRAMUSCULAR | Status: AC
  Administered 2015-06-12: 160 mg via INTRAMUSCULAR
  Filled 2015-06-12: qty 4

## 2015-06-12 MED ORDER — LIDOCAINE HCL (PF) 2 % IJ SOLN
INTRAMUSCULAR | Status: AC
  Administered 2015-06-12: 10 mL
  Filled 2015-06-12: qty 10

## 2015-06-12 MED ORDER — LIDOCAINE HCL (PF) 2 % IJ SOLN
INTRAMUSCULAR | Status: AC
  Filled 2015-06-12: qty 10

## 2015-06-12 NOTE — Discharge Instructions (Signed)
Transrectal Ultrasound-Guided Biopsy °A transrectal ultrasound-guided biopsy is a procedure to remove samples of tissue from your prostate using ultrasound images to guide the procedure. The procedure is usually done to evaluate the prostate gland of men who have an elevated prostate-specific antigen (PSA). PSA is a blood test to screen for prostate cancer. The biopsy samples are taken to check for prostate cancer.  °LET YOUR HEALTH CARE PROVIDER KNOW ABOUT: °· Any allergies you have. °· All medicines you are taking, including vitamins, herbs, eye drops, creams, and over-the-counter medicines. °· Previous problems you or members of your family have had with the use of anesthetics. °· Any blood disorders you have. °· Previous surgeries you have had. °· Medical conditions you have. °RISKS AND COMPLICATIONS °Generally, this is a safe procedure. However, as with any procedure, problems can occur. Possible problems include: °· Infection of your prostate. °· Bleeding from your rectum or blood in your urine. °· Difficulty urinating. °· Nerve damage (this is usually temporary). °· Damage to surrounding structures such as blood vessels, organs, and muscles, which would require other procedures. °BEFORE THE PROCEDURE °· Do not eat or drink anything after midnight on the night before the procedure or as directed by your health care provider. °· Take medicines only as directed by your health care provider. °· Your health care provider may have you stop taking certain medicines 5-7 days before the procedure. °· You will be given an enema before the procedure. During an enema, a liquid is injected into your rectum to clear out waste. °· You may have lab tests the day of your procedure.   °· Plan to have someone take you home after the procedure. °PROCEDURE  °· You will be given medicine to help you relax (sedative) before the procedure. An IV tube will be inserted into one of your veins and used to give fluids and  medicine. °· You will be given antibiotic medicine to reduce the risk of an infection. °· You will be placed on your side for the procedure. °· A probe with lubricated gel will be placed into your rectum, and images will be taken of your prostate and surrounding structures. °· Numbing medicine will be injected into the prostate before the biopsy samples are taken. °· A biopsy needle will then be inserted and guided to your prostate with the use of the ultrasound images. °· Samples of prostate tissue will be taken, and the needle will then be removed. °· The biopsy samples will be sent to a lab to be analyzed. Results are usually back in 2-3 days. °AFTER THE PROCEDURE °· You will be taken to a recovery area where you will be monitored. °· You may have some discomfort in the rectal area. You will be given pain medicines to control this. °· You may be allowed to go home the same day, or you may need to stay in the hospital overnight. °Document Released: 04/24/2014 Document Reviewed: 07/27/2013 °ExitCare® Patient Information ©2015 [employer]. This information is not intended to replace advice given to you by your health care provider. Make sure you discuss any questions you have with your health care provider. ° °

## 2015-07-10 ENCOUNTER — Institutional Professional Consult (permissible substitution) (INDEPENDENT_AMBULATORY_CARE_PROVIDER_SITE_OTHER): Payer: 59 | Admitting: Urology

## 2015-07-10 DIAGNOSIS — N400 Enlarged prostate without lower urinary tract symptoms: Secondary | ICD-10-CM | POA: Diagnosis not present

## 2015-07-10 DIAGNOSIS — C61 Malignant neoplasm of prostate: Secondary | ICD-10-CM | POA: Diagnosis not present

## 2015-08-17 ENCOUNTER — Ambulatory Visit (INDEPENDENT_AMBULATORY_CARE_PROVIDER_SITE_OTHER): Payer: 59 | Admitting: Family Medicine

## 2015-08-17 ENCOUNTER — Encounter: Payer: Self-pay | Admitting: Family Medicine

## 2015-08-17 ENCOUNTER — Ambulatory Visit (HOSPITAL_COMMUNITY)
Admission: RE | Admit: 2015-08-17 | Discharge: 2015-08-17 | Disposition: A | Discharge status: Home / Self Care | Payer: 59 | Source: Ambulatory Visit | Attending: Family Medicine | Admitting: Family Medicine

## 2015-08-17 VITALS — BP 132/80 | HR 68 | Temp 97.6°F | Resp 14 | Ht 74.0 in | Wt 178.0 lb

## 2015-08-17 DIAGNOSIS — E559 Vitamin D deficiency, unspecified: Secondary | ICD-10-CM | POA: Diagnosis not present

## 2015-08-17 DIAGNOSIS — C61 Malignant neoplasm of prostate: Secondary | ICD-10-CM | POA: Insufficient documentation

## 2015-08-17 DIAGNOSIS — Z77090 Contact with and (suspected) exposure to asbestos: Secondary | ICD-10-CM | POA: Diagnosis not present

## 2015-08-17 DIAGNOSIS — R05 Cough: Secondary | ICD-10-CM | POA: Insufficient documentation

## 2015-08-17 DIAGNOSIS — E785 Hyperlipidemia, unspecified: Secondary | ICD-10-CM

## 2015-08-17 DIAGNOSIS — R0989 Other specified symptoms and signs involving the circulatory and respiratory systems: Secondary | ICD-10-CM | POA: Diagnosis not present

## 2015-08-17 DIAGNOSIS — Z0000 Encounter for general adult medical examination without abnormal findings: Secondary | ICD-10-CM

## 2015-08-17 LAB — COMPREHENSIVE METABOLIC PANEL
ALK PHOS: 97 U/L (ref 40–115)
ALT: 12 U/L (ref 9–46)
AST: 19 U/L (ref 10–35)
Albumin: 4.1 g/dL (ref 3.6–5.1)
BUN: 15 mg/dL (ref 7–25)
CO2: 28 mmol/L (ref 20–31)
Calcium: 9.1 mg/dL (ref 8.6–10.3)
Chloride: 106 mmol/L (ref 98–110)
Creat: 1.27 mg/dL — ABNORMAL HIGH (ref 0.70–1.25)
Glucose, Bld: 94 mg/dL (ref 70–99)
POTASSIUM: 4.3 mmol/L (ref 3.5–5.3)
Sodium: 144 mmol/L (ref 135–146)
Total Bilirubin: 0.5 mg/dL (ref 0.2–1.2)
Total Protein: 6.7 g/dL (ref 6.1–8.1)

## 2015-08-17 LAB — LIPID PANEL
CHOLESTEROL: 195 mg/dL (ref 125–200)
HDL: 58 mg/dL (ref >=40)
LDL Cholesterol: 125 mg/dL (ref <130)
Total CHOL/HDL Ratio: 3.4 Ratio (ref <=5.0)
Triglycerides: 61 mg/dL (ref <150)
VLDL: 12 mg/dL (ref <30)

## 2015-08-17 LAB — CBC WITH DIFFERENTIAL/PLATELET
BASOS PCT: 0 % (ref 0–1)
Basophils Absolute: 0 10*3/uL (ref 0.0–0.1)
EOS ABS: 0.1 10*3/uL (ref 0.0–0.7)
Eosinophils Relative: 3 % (ref 0–5)
HCT: 43.3 % (ref 39.0–52.0)
Hemoglobin: 14.7 g/dL (ref 13.0–17.0)
Lymphocytes Relative: 38 % (ref 12–46)
Lymphs Abs: 1.1 10*3/uL (ref 0.7–4.0)
MCH: 30.1 pg (ref 26.0–34.0)
MCHC: 33.9 g/dL (ref 30.0–36.0)
MCV: 88.7 fL (ref 78.0–100.0)
MONO ABS: 0.2 10*3/uL (ref 0.1–1.0)
MONOS PCT: 7 % (ref 3–12)
MPV: 11 fL (ref 8.6–12.4)
Neutro Abs: 1.5 10*3/uL — ABNORMAL LOW (ref 1.7–7.7)
Neutrophils Relative %: 52 % (ref 43–77)
PLATELETS: 153 10*3/uL (ref 150–400)
RBC: 4.88 MIL/uL (ref 4.22–5.81)
RDW: 15.2 % (ref 11.5–15.5)
WBC: 2.9 10*3/uL — AB (ref 4.0–10.5)

## 2015-08-17 NOTE — Assessment & Plan Note (Signed)
Followed by pulmonary, will refer to [facility] to continue care as his previous doctor is very far away He is due for PFT, I will go ahead and arrange the CXR  Records from previous physician will be sent

## 2015-08-17 NOTE — Patient Instructions (Signed)
Get Chest xray done - at [facility] Referral to pulmonary in [place] We will call with lab results Come in for nurse visit in November - for Pneumonia vaccine- Prevnar 13 F/U 1 year or as needed for Physical

## 2015-08-17 NOTE — Assessment & Plan Note (Signed)
He will plan on consultation with surgeon to discuss risk and benefits of prostectomy

## 2015-08-17 NOTE — Assessment & Plan Note (Signed)
CPE done, prevention UTD, he will return for PRevnar 13 after his 64th birthday Flu shot will also be given  Fasting labs today

## 2015-08-17 NOTE — Progress Notes (Signed)
Patient ID: Luis Stewart, male   DOB: 10/19/1951, 64 y.o.   MRN: [id number]   Subjective:    Patient ID: Luis Stewart, male    DOB: 1951/09/18, 64 y.o.   MRN: [id number]  Patient presents for CPE Concerns: followed for prostate cancer by [facility]- he is choosing between active surveillance and surgical removal of prostate due to size of prostate, 1 core biopsy are with cancer and elevated PSA levels for > 10 years.  History of asbestos exposure- was followed by Dr. Pearlie Oyster in [place], needs a closer pulmonologist, needs PFT yearly and Chest xray per last note in 2015. Has occasional cough with mild sputum production and sinus drainage at times, but nothing severe and no changes over the years. No hemoptysis. Able to exercise without any difficulty   Due for fasting labs  Immunizations UTD  Colonoscopy UTD   Review Of Systems:  GEN- denies fatigue, fever, weight loss,weakness, recent illness HEENT- denies eye drainage, change in vision, nasal discharge, CVS- denies chest pain, palpitations RESP- denies SOB, cough, wheeze ABD- denies N/V, change in stools, abd pain GU- denies dysuria, hematuria, dribbling, incontinence MSK- denies joint pain, muscle aches, injury Neuro- denies headache, dizziness, syncope, seizure activity       Objective:    BP 132/80 mmHg  Pulse 68  Temp(Src) 97.6 F (36.4 C) (Oral)  Resp 14  Ht 6\' 2"  (1.88 m)  Wt 178 lb (80.74 kg)  BMI 22.84 kg/m2 GEN- NAD, alert and oriented x3 HEENT- PERRL, EOMI, non injected sclera, pink conjunctiva, MMM, oropharynx clear Neck- Supple, no thyromegaly CVS- RRR, no murmur RESP-CTAB ABD-NABS,soft,NT,ND GU- Deferred EXT- No edema Pulses- Radial, DP- 2+        Assessment & Plan:      Problem List Items Addressed This Visit    Vitamin D deficiency   Relevant Orders   Vitamin D, 25-hydroxy   Routine general medical examination at a health care facility - Primary    CPE done, prevention  UTD, he will return for PRevnar 13 after his 64th birthday Flu shot will also be given  Fasting labs today      Relevant Orders   CBC with Differential/Platelet   Comprehensive metabolic panel   Prostate cancer    He will plan on consultation with surgeon to discuss risk and benefits of prostectomy       Mild hyperlipidemia   Relevant Orders   Lipid panel   Exposure to asbestos    Followed by pulmonary, will refer to [facility] to continue care as his previous doctor is very far away He is due for PFT, I will go ahead and arrange the CXR  Records from previous physician will be sent      Relevant Orders   DG Chest 2 View   Ambulatory referral to Pulmonology      Note: This dictation was prepared with Dragon dictation along with smaller phrase technology. Any transcriptional errors that result from this process are unintentional.

## 2015-08-18 LAB — VITAMIN D 25 HYDROXY (VIT D DEFICIENCY, FRACTURES): Vit D, 25-Hydroxy: 19 ng/mL — ABNORMAL LOW (ref 30–100)

## 2015-08-21 ENCOUNTER — Other Ambulatory Visit: Payer: Self-pay | Admitting: *Deleted

## 2015-08-21 DIAGNOSIS — D72819 Decreased white blood cell count, unspecified: Secondary | ICD-10-CM

## 2015-08-21 MED ORDER — VITAMIN D (ERGOCALCIFEROL) 1.25 MG (50000 UNIT) PO CAPS: ORAL_CAPSULE | ORAL | Status: DC

## 2015-08-22 ENCOUNTER — Telehealth: Payer: Self-pay | Admitting: *Deleted

## 2015-08-22 ENCOUNTER — Other Ambulatory Visit: Payer: Self-pay | Admitting: *Deleted

## 2015-08-22 MED ORDER — VITAMIN D (ERGOCALCIFEROL) 1.25 MG (50000 UNIT) PO CAPS: ORAL_CAPSULE | ORAL | Status: DC

## 2015-08-22 NOTE — Telephone Encounter (Signed)
Pt has appt scheduled for 10/01/15 at 3pm at Dr. Luan Pulling office in [place] at 3pm, left message to return call to pt.

## 2015-08-23 NOTE — Telephone Encounter (Signed)
Pt called back aware of appt but would like to try to see if [facility] can see him quicker.

## 2015-08-24 ENCOUNTER — Telehealth: Payer: Self-pay | Admitting: Family Medicine

## 2015-08-24 NOTE — Telephone Encounter (Signed)
Contacted Shelby back and reference number was given verbally from [place].

## 2015-08-24 NOTE — Telephone Encounter (Signed)
Shelby from [facility] calling to speak with you regarding a compass referral for this patient

## 2015-08-24 NOTE — Telephone Encounter (Signed)
Pt has appt scheduled at [facility] on Tuesday Sept 6 at 10:45 with Dr.Wert, pt is aware of appt

## 2015-08-28 ENCOUNTER — Telehealth: Payer: Self-pay | Admitting: *Deleted

## 2015-08-28 ENCOUNTER — Ambulatory Visit (INDEPENDENT_AMBULATORY_CARE_PROVIDER_SITE_OTHER): Payer: Worker's Compensation | Admitting: Internal Medicine

## 2015-08-28 ENCOUNTER — Encounter: Payer: Self-pay | Admitting: Internal Medicine

## 2015-08-28 VITALS — BP 116/70 | HR 60 | Ht 74.0 in | Wt 177.8 lb

## 2015-08-28 DIAGNOSIS — R05 Cough: Secondary | ICD-10-CM

## 2015-08-28 DIAGNOSIS — Z77090 Contact with and (suspected) exposure to asbestos: Secondary | ICD-10-CM

## 2015-08-28 DIAGNOSIS — R058 Other specified cough: Secondary | ICD-10-CM

## 2015-08-28 NOTE — Telephone Encounter (Signed)
Submitted referral thru [facility] compass to Dr. Christinia Gully, MD with referral number [phone]  Type of referral: consult and treat  Start Date: 08/24/15  End Date: 02/21/16  Number of visits:6  Dx: N88.719- Contact with and suspected exposure to asbetos

## 2015-08-28 NOTE — Patient Instructions (Signed)
Please see patient coordinator before you leave today  to schedule HRCT chest and return here same day with pfts on return   Try zyrtec 10 mg at bedtime to see if your daily am cough is better if now I recommend For drainage take chlortrimeton (chlorpheniramine) 4 mg every 4 hours available over the counter (may cause drowsiness)

## 2015-08-28 NOTE — Progress Notes (Signed)
   Subjective:    Patient ID: Luis Stewart, male    DOB: [birth year],   MRN: [id number]  HPI  89 yobm from [place]  never smoker worked for [employer] last exposed 2003 with abn cxr 2014 referred by Dr Luis Stewart to pulmonary clinic 08/28/2015    08/28/2015 1st [facility] office visit/ Luis Stewart   Chief Complaint  Patient presents with  . Pulmonary Consult    Referred by Dr. Vic Stewart. Pt has hx of asbestos exposure. He states that he is overall doing well. He does have some prod cough with clear to yellow sputum.    eval by Dr Luis Stewart with ? increaesed int markings but no f/u ct to date  Am cough x decades/ sensation of pnds / has not tried otcs  No obvious other patterns in day to day or daytime variabilty or assoc chronic cough or cp or chest tightness, subjective wheeze overt sinus  symptoms. No unusual exp hx or h/o childhood pna/ asthma or knowledge of premature birth.  Sleeping ok without nocturnal  or early am exacerbation  of respiratory  c/o's or need for noct saba. Also denies any obvious fluctuation of symptoms with weather or environmental changes or other aggravating or alleviating factors except as outlined above   Current Medications, Allergies, Complete Past Medical History, Past Surgical History, Family History, and Social History were reviewed in [employer] record.               Review of Systems  Constitutional: Negative for fever, chills, activity change, appetite change and unexpected weight change.  HENT: Positive for dental problem and sore throat. Negative for congestion, postnasal drip, rhinorrhea, sneezing, trouble swallowing and voice change.   Eyes: Negative for visual disturbance.  Respiratory: Positive for cough. Negative for choking and shortness of breath.   Cardiovascular: Negative for chest pain and leg swelling.  Gastrointestinal: Positive for abdominal pain. Negative for nausea and vomiting.    Genitourinary: Negative for difficulty urinating.       Indigestion  Musculoskeletal: Positive for arthralgias.  Skin: Negative for rash.  Psychiatric/Behavioral: Negative for behavioral problems and confusion.       Objective:   Physical Exam  amb pleasant bm nad  Wt Readings from Last 3 Encounters:  08/28/15 177 lb 12.8 oz (80.65 kg)  08/17/15 178 lb (80.74 kg)  09/04/14 184 lb (83.462 kg)    Vital signs reviewed    HEENT: nl dentition, turbinates, and orophanx. Nl external ear canals without cough reflex   NECK :  without JVD/Nodes/TM/ nl carotid upstrokes bilaterally   LUNGS: no acc muscle use, clear to A and P bilaterally without cough on insp or exp maneuvers   CV:  RRR  no s3 or murmur or increase in P2, no edema   ABD:  soft and nontender with nl excursion in the supine position. No bruits or organomegaly, bowel sounds nl  MS:  warm without deformities, calf tenderness, cyanosis or clubbing  SKIN: warm and dry without lesions    NEURO:  alert, approp, no deficits     I personally reviewed images and agree with radiology impression as follows:  CXR:   08/17/15 The lungs are mildly hyperinflated. This may be voluntary or may reflect underlying COPD or reactive airway disease. No evidence of asbestos related change is seen.      Assessment & Plan:

## 2015-08-29 ENCOUNTER — Encounter: Payer: Self-pay | Admitting: Internal Medicine

## 2015-08-29 DIAGNOSIS — R05 Cough: Secondary | ICD-10-CM | POA: Insufficient documentation

## 2015-08-29 NOTE — Assessment & Plan Note (Signed)
This is a long-standing problem and he has not yet tried appropriate over-the-counter medications. I reviewed the options with him today and would start Zyrtec but also consider chlorpheniramine, especially at bedtime. If not responsive then we need to consider also nocturnal and daytime reflux in the differential diagnosis

## 2015-08-29 NOTE — Assessment & Plan Note (Signed)
I strongly doubt that he was still being exposed to significant environmental asbestos as late as 2003, at least in any kind of quantity that would cause   asbestosis and this never smoker. However, as per his laywers instructions, I think it's appropriate since there is a question of interstitial lung disease raised by Dr. Pearlie Oyster to go ahead and do a CT high resolution to see if there is any evidence to support this diagnosis in any form, asbestos related or otherwise, as he could have  A vert early stage of IPF unrelated asbestos. A follow-up set of PFTs would be appropriate this point also

## 2015-09-27 ENCOUNTER — Encounter: Payer: Self-pay | Admitting: Internal Medicine

## 2015-09-27 ENCOUNTER — Ambulatory Visit (INDEPENDENT_AMBULATORY_CARE_PROVIDER_SITE_OTHER)
Admission: RE | Admit: 2015-09-27 | Discharge: 2015-09-27 | Disposition: A | Discharge status: Home / Self Care | Payer: Worker's Compensation | Source: Ambulatory Visit | Attending: Internal Medicine | Admitting: Internal Medicine

## 2015-09-27 ENCOUNTER — Ambulatory Visit: Payer: Self-pay | Admitting: Internal Medicine

## 2015-09-27 ENCOUNTER — Ambulatory Visit (INDEPENDENT_AMBULATORY_CARE_PROVIDER_SITE_OTHER): Payer: Worker's Compensation | Admitting: Internal Medicine

## 2015-09-27 ENCOUNTER — Ambulatory Visit (HOSPITAL_COMMUNITY)
Admission: RE | Admit: 2015-09-27 | Discharge: 2015-09-27 | Disposition: A | Discharge status: Home / Self Care | Payer: Medicare Other | Source: Ambulatory Visit | Attending: Internal Medicine | Admitting: Internal Medicine

## 2015-09-27 VITALS — BP 128/78 | HR 61

## 2015-09-27 DIAGNOSIS — Z77090 Contact with and (suspected) exposure to asbestos: Secondary | ICD-10-CM | POA: Insufficient documentation

## 2015-09-27 DIAGNOSIS — R0609 Other forms of dyspnea: Secondary | ICD-10-CM | POA: Insufficient documentation

## 2015-09-27 DIAGNOSIS — R05 Cough: Secondary | ICD-10-CM | POA: Insufficient documentation

## 2015-09-27 DIAGNOSIS — R058 Other specified cough: Secondary | ICD-10-CM

## 2015-09-27 LAB — PULMONARY FUNCTION TEST
DL/VA % PRED: 119 %
DL/VA: 5.77 ml/min/mmHg/L
DLCO unc % pred: 90 %
DLCO unc: 34.25 ml/min/mmHg
FEF 25-75 POST: 3.7 L/s
FEF 25-75 Pre: 3.33 L/sec
FEF2575-%Change-Post: 11 %
FEF2575-%PRED-POST: 119 %
FEF2575-%PRED-PRE: 107 %
FEV1-%CHANGE-POST: 1 %
FEV1-%PRED-PRE: 92 %
FEV1-%Pred-Post: 94 %
FEV1-PRE: 3.25 L
FEV1-Post: 3.31 L
FEV1FVC-%CHANGE-POST: 5 %
FEV1FVC-%PRED-PRE: 104 %
FEV6-%Change-Post: -2 %
FEV6-%Pred-Post: 88 %
FEV6-%Pred-Pre: 91 %
FEV6-Post: 3.89 L
FEV6-Pre: 3.99 L
FEV6FVC-%Change-Post: 0 %
FEV6FVC-%Pred-Post: 103 %
FEV6FVC-%Pred-Pre: 103 %
FVC-%Change-Post: -3 %
FVC-%PRED-POST: 86 %
FVC-%PRED-PRE: 88 %
FVC-POST: 3.91 L
FVC-PRE: 4.03 L
POST FEV1/FVC RATIO: 85 %
PRE FEV1/FVC RATIO: 81 %
PRE FEV6/FVC RATIO: 99 %
Post FEV6/FVC ratio: 100 %
RV % pred: 93 %
RV: 2.39 L
TLC % PRED: 84 %
TLC: 6.64 L

## 2015-09-27 MED ORDER — ALBUTEROL SULFATE (2.5 MG/3ML) 0.083% IN NEBU
2.5000 mg | INHALATION_SOLUTION | Freq: Once | RESPIRATORY_TRACT | Status: AC
  Administered 2015-09-27: 2.5 mg via RESPIRATORY_TRACT

## 2015-09-27 NOTE — Progress Notes (Signed)
Subjective:    Patient ID: Luis Stewart, male    DOB: [birth year]    MRN: [id number]    Brief patient profile:  17 yobm from [place]  never smoker worked for [employer] last exposed 2003 with abn cxr 2014 referred by Dr Buelah Manis to pulmonary clinic 08/28/2015    History of Present Illness  08/28/2015 1st [facility] office visit/ Luis Stewart   Chief Complaint  Patient presents with  . Pulmonary Consult    Referred by Dr. Vic Blackbird. Pt has hx of asbestos exposure. He states that he is overall doing well. He does have some prod cough with clear to yellow sputum.    eval by Dr Beverly Gust with ? increaesed int markings but no f/u ct to date  Am cough x decades/ sensation of pnds / has not tried otc's rec Please see patient coordinator before you leave today  to schedule HRCT chest and return here same day with pfts on return  Try zyrtec 10 mg at bedtime to see if your daily am cough is better if now I recommend For drainage take chlortrimeton (chlorpheniramine) 4 mg every 4 hours available over the counter (may cause drowsiness)    09/27/2015  f/u ov/Luis Stewart re:  Chief Complaint  Patient presents with  . Follow-up    Hx of asbestos exposure. Pt states that hsi breathing is doing well. Pt c/o of occasional wet cough, yellow to clear mucus. Pt denies wheeze/CP/SOB.      Not limited by breathing from desired activities   No obvious day to day or daytime variability or assoc cp or chest tightness, subjective wheeze or overt   hb symptoms. No unusual exp hx or h/o childhood pna/ asthma or knowledge of premature birth.  Sleeping ok without nocturnal  or early am exacerbation  of respiratory  c/o's or need for noct saba. Also denies any obvious fluctuation of symptoms with weather or environmental changes or other aggravating or alleviating factors except as outlined above   Current Medications, Allergies, Complete Past Medical History, Past Surgical History, Family History, and  Social History were reviewed in [employer] record.  ROS  The following are not active complaints unless bolded sore throat, dysphagia, dental problems, itching, sneezing,  nasal congestion or excess/ purulent secretions, ear ache,   fever, chills, sweats, unintended wt loss, classically pleuritic or exertional cp, hemoptysis,  orthopnea pnd or leg swelling, presyncope, palpitations, abdominal pain, anorexia, nausea, vomiting, diarrhea  or change in bowel or bladder habits, change in stools or urine, dysuria,hematuria,  rash, arthralgias, visual complaints, headache, numbness, weakness or ataxia or problems with walking or coordination,  change in mood/affect or memory.           Objective:   Physical Exam  amb pleasant bm nad  09/27/2015       184  Wt Readings from Last 3 Encounters:  08/28/15 177 lb 12.8 oz (80.65 kg)  08/17/15 178 lb (80.74 kg)  09/04/14 184 lb (83.462 kg)    Vital signs reviewed    HEENT: nl dentition, turbinates, and orophanx. Nl external ear canals without cough reflex   NECK :  without JVD/Nodes/TM/ nl carotid upstrokes bilaterally   LUNGS: no acc muscle use, clear to A and P bilaterally without cough on insp or exp maneuvers   CV:  RRR  no s3 or murmur or increase in P2, no edema   ABD:  soft and nontender with nl excursion in the supine position.  No bruits or organomegaly, bowel sounds nl  MS:  warm without deformities, calf tenderness, cyanosis or clubbing  SKIN: warm and dry without lesions    NEURO:  alert, approp, no deficits     I personally reviewed images and agree with radiology impression as follows:  CXR:   08/17/15 The lungs are mildly hyperinflated. This may be voluntary or may reflect underlying COPD or reactive airway disease. No evidence of asbestos related change is seen.      Assessment & Plan:

## 2015-09-27 NOTE — Patient Instructions (Addendum)
Try zyrtec 10 mg at bedtime to see if your daily am cough is better if not I recommend For drainage take chlortrimeton (chlorpheniramine) 4 mg every 4 hours available over the counter (may cause drowsiness)   Return in one year for pfts and cxr

## 2015-09-29 DIAGNOSIS — Z23 Encounter for immunization: Secondary | ICD-10-CM | POA: Diagnosis not present

## 2015-09-30 ENCOUNTER — Encounter (HOSPITAL_COMMUNITY): Payer: Self-pay

## 2015-09-30 ENCOUNTER — Emergency Department (HOSPITAL_COMMUNITY): Payer: Medicare Other

## 2015-09-30 ENCOUNTER — Emergency Department (HOSPITAL_COMMUNITY)
Admission: EM | Admit: 2015-09-30 | Discharge: 2015-09-30 | Disposition: A | Discharge status: Home / Self Care | Payer: Medicare Other | Attending: Emergency Medicine | Admitting: Emergency Medicine

## 2015-09-30 DIAGNOSIS — R011 Cardiac murmur, unspecified: Secondary | ICD-10-CM | POA: Diagnosis not present

## 2015-09-30 DIAGNOSIS — Z79899 Other long term (current) drug therapy: Secondary | ICD-10-CM | POA: Diagnosis not present

## 2015-09-30 DIAGNOSIS — Z87438 Personal history of other diseases of male genital organs: Secondary | ICD-10-CM | POA: Diagnosis not present

## 2015-09-30 DIAGNOSIS — Z8679 Personal history of other diseases of the circulatory system: Secondary | ICD-10-CM | POA: Diagnosis not present

## 2015-09-30 DIAGNOSIS — Z8546 Personal history of malignant neoplasm of prostate: Secondary | ICD-10-CM | POA: Diagnosis not present

## 2015-09-30 DIAGNOSIS — Z8639 Personal history of other endocrine, nutritional and metabolic disease: Secondary | ICD-10-CM | POA: Diagnosis not present

## 2015-09-30 DIAGNOSIS — Z8669 Personal history of other diseases of the nervous system and sense organs: Secondary | ICD-10-CM | POA: Diagnosis not present

## 2015-09-30 DIAGNOSIS — R0789 Other chest pain: Secondary | ICD-10-CM

## 2015-09-30 DIAGNOSIS — Z8709 Personal history of other diseases of the respiratory system: Secondary | ICD-10-CM | POA: Insufficient documentation

## 2015-09-30 DIAGNOSIS — R0602 Shortness of breath: Secondary | ICD-10-CM | POA: Diagnosis not present

## 2015-09-30 DIAGNOSIS — R079 Chest pain, unspecified: Secondary | ICD-10-CM | POA: Diagnosis not present

## 2015-09-30 LAB — TROPONIN I: Troponin I: <0.03 ng/mL (ref <0.031)

## 2015-09-30 LAB — CBC WITH DIFFERENTIAL/PLATELET
BASOS ABS: 0 10*3/uL (ref 0.0–0.1)
Basophils Relative: 0 %
EOS PCT: 2 %
Eosinophils Absolute: 0.1 10*3/uL (ref 0.0–0.7)
HCT: 43.2 % (ref 39.0–52.0)
Hemoglobin: 15.1 g/dL (ref 13.0–17.0)
LYMPHS PCT: 30 %
Lymphs Abs: 1.5 10*3/uL (ref 0.7–4.0)
MCH: 31.1 pg (ref 26.0–34.0)
MCHC: 35 g/dL (ref 30.0–36.0)
MCV: 89.1 fL (ref 78.0–100.0)
MONO ABS: 0.4 10*3/uL (ref 0.1–1.0)
MONOS PCT: 7 %
Neutro Abs: 3.2 10*3/uL (ref 1.7–7.7)
Neutrophils Relative %: 61 %
PLATELETS: 147 10*3/uL — AB (ref 150–400)
RBC: 4.85 MIL/uL (ref 4.22–5.81)
RDW: 14.6 % (ref 11.5–15.5)
WBC: 5.2 10*3/uL (ref 4.0–10.5)

## 2015-09-30 LAB — BASIC METABOLIC PANEL
Anion gap: 5 (ref 5–15)
BUN: 19 mg/dL (ref 6–20)
CALCIUM: 8.6 mg/dL — AB (ref 8.9–10.3)
CO2: 28 mmol/L (ref 22–32)
Chloride: 109 mmol/L (ref 101–111)
Creatinine, Ser: 1.14 mg/dL (ref 0.61–1.24)
GFR calc Af Amer: >60 mL/min (ref >60)
GLUCOSE: 92 mg/dL (ref 65–99)
POTASSIUM: 4.3 mmol/L (ref 3.5–5.1)
Sodium: 142 mmol/L (ref 135–145)

## 2015-09-30 MED ORDER — KETOROLAC TROMETHAMINE 30 MG/ML IJ SOLN
30.0000 mg | Freq: Once | INTRAMUSCULAR | Status: AC
  Administered 2015-09-30: 30 mg via INTRAVENOUS
  Filled 2015-09-30: qty 1

## 2015-09-30 MED ORDER — NAPROXEN 500 MG PO TABS: 500.0000 mg | ORAL_TABLET | Freq: Two times a day (BID) | ORAL | Status: DC

## 2015-09-30 NOTE — Assessment & Plan Note (Addendum)
HRCT rec 09/27/15  1. No evidence of asbestos related pleural disease. 2. No evidence of interstitial lung disease. Specifically, no significant regions of subpleural reticulation, ground-glass attenuation, traction bronchiectasis or frank honeycombing. 3. Mild air trapping, suggestive of nonspecific mild small airway disease. 4. Solitary 4 mm right middle lobe pulmonary nodule with a benign Appearance. - PFT's 09/27/2015 wnl   Since there is no evidence at all of asbestos lung dz here - since he has a very tiny nodule and is at low risk I rec f/u cxr only in one year unless symptoms develop in interim  I had an extended discussion with the patient reviewing all relevant studies completed to date and  lasting 15 to 20 minutes of a 25 minute visit    Each maintenance medication was reviewed in detail including most importantly the difference between maintenance and prns and under what circumstances the prns are to be triggered using an action plan format that is not reflected in the computer generated alphabetically organized AVS.    Please see instructions for details which were reviewed in writing and the patient given a copy highlighting the part that I personally wrote and discussed at today's ov.

## 2015-09-30 NOTE — Discharge Instructions (Signed)
Recheck here with any changing or worsening symptoms.   Chest Wall Pain Chest wall pain is pain in or around the bones and muscles of your chest. Sometimes, an injury causes this pain. Sometimes, the cause may not be known. This pain may take several weeks or longer to get better. HOME CARE INSTRUCTIONS  Pay attention to any changes in your symptoms. Take these actions to help with your pain:   Rest as told by your health care provider.   Avoid activities that cause pain. These include any activities that use your chest muscles or your abdominal and side muscles to lift heavy items.   If directed, apply ice to the painful area:  Put ice in a plastic bag.  Place a towel between your skin and the bag.  Leave the ice on for 20 minutes, 2-3 times per day.  Take over-the-counter and prescription medicines only as told by your health care provider.  Do not use tobacco products, including cigarettes, chewing tobacco, and e-cigarettes. If you need help quitting, ask your health care provider.  Keep all follow-up visits as told by your health care provider. This is important. SEEK MEDICAL CARE IF:  You have a fever.  Your chest pain becomes worse.  You have new symptoms. SEEK IMMEDIATE MEDICAL CARE IF:  You have nausea or vomiting.  You feel sweaty or light-headed.  You have a cough with phlegm (sputum) or you cough up blood.  You develop shortness of breath.   This information is not intended to replace advice given to you by your health care provider. Make sure you discuss any questions you have with your health care provider.   Document Released: 12/08/2005 Document Revised: 08/29/2015 Document Reviewed: 03/05/2015 Elsevier Interactive Patient Education [employer].

## 2015-09-30 NOTE — ED Provider Notes (Signed)
CSN: [id number]     Arrival date & time 09/30/15  2039 History  By signing my name below, I, Luis Stewart, attest that this documentation has been prepared under the direction and in the presence of Tanna Furry, MD. Electronically Signed: Meriel Stewart, ED Scribe. 09/30/2015. 9:36 PM.    Chief Complaint  Patient presents with  . Chest Pain   The history is provided by the patient. No language interpreter was used.   HPI Comments: Luis Stewart is a 64 y.o. male, with no cardiac PMhx, who presents to the Emergency Department complaining of intermittent, sharp, left-sided CP that has been present with bending and twisting throughout the day today with onset at 9 AM this morning after walking into church. He associates a mild SOB. He denies experiencing CP as such in the past. Denies deep breathing to be an exacerbating factor. Pt exercises regularly and denies experiencing CP or SOB with exertion. He is not a current smoker. No PMhx of DM or HTN. PFhx of CAD in father.   Past Medical History  Diagnosis Date  . Enlarged prostate   . Broken bones     lower rt leg- Child  . Hx of migraine headaches   . Neuromuscular disorder (HCC)     neuropathy  . Hyperlipidemia     borderline  . Asbestosis ([place])   . Cancer [facility]) 2015    prostate canceer 1/12 core biopsy- Adenocarcinoma   Past Surgical History  Procedure Laterality Date  . Biopsy prostate  2003 and 2005  . Colonoscopy  11/28/2008    Dr. Rourk:internal hemorrhoids/tortus colon/ascending colon polyps, tubular adenomas  . Colonoscopy N/A 09/04/2014    Procedure: COLONOSCOPY;  Surgeon: Daneil Dolin, MD;  Location: [facility]ITE;  Service: Endoscopy;  Laterality: N/A;  9:30   Family History  Problem Relation Age of Onset  . Diabetes Mother   . Hypertension Mother   . Obesity Mother   . Heart disease Mother   . Mental illness Mother   . Hypertension Sister   . Obesity Sister   . Arthritis Sister   . Deep vein thrombosis  Father   . Dementia Father   . Heart disease Father     heart bypass  . Colon cancer Neg Hx    Social History  Substance Use Topics  . Smoking status: Never Smoker   . Smokeless tobacco: Never Used  . Alcohol Use: No    Review of Systems  Constitutional: Negative for fever, chills, diaphoresis, appetite change and fatigue.  HENT: Negative for mouth sores, sore throat and trouble swallowing.   Eyes: Negative for visual disturbance.  Respiratory: Positive for shortness of breath. Negative for cough, chest tightness and wheezing.   Cardiovascular: Positive for chest pain.  Gastrointestinal: Negative for nausea, vomiting, abdominal pain, diarrhea and abdominal distention.  Endocrine: Negative for polydipsia, polyphagia and polyuria.  Genitourinary: Negative for dysuria, frequency and hematuria.  Musculoskeletal: Negative for gait problem.  Skin: Negative for color change, pallor and rash.  Neurological: Negative for dizziness, syncope, light-headedness and headaches.  Hematological: Does not bruise/bleed easily.  Psychiatric/Behavioral: Negative for behavioral problems and confusion.   Allergies  Review of patient's allergies indicates no known allergies.  Home Medications   Prior to Admission medications   Medication Sig Start Date End Date Taking? Authorizing Provider  cetirizine (ZYRTEC) 10 MG tablet Take 10 mg by mouth daily as needed for allergies.   Yes Historical Provider, MD  Multiple Vitamins-Minerals (MULTIVITAMIN PO) Take  1 tablet by mouth daily. Centrum Silver for Men   Yes Historical Provider, MD  naproxen (NAPROSYN) 500 MG tablet Take 1 tablet (500 mg total) by mouth 2 (two) times daily. 09/30/15   Tanna Furry, MD  Vitamin D, Ergocalciferol, (DRISDOL) 50000 UNITS CAPS capsule Take (1) capsule by mouth every week x12 weeks, then stop. Patient taking differently: Take 50,000 Units by mouth every 7 (seven) days. Take (1) capsule by mouth every week x12 weeks, then stop.  08/22/15   Alycia Rossetti, MD   BP 151/83 mmHg  Pulse 69  Temp(Src) 98.7 F (37.1 C) (Oral)  Resp 16  Ht 6\' 2"  (1.88 m)  Wt 180 lb (81.647 kg)  BMI 23.10 kg/m2  SpO2 100% Physical Exam  Constitutional: He is oriented to person, place, and time. He appears well-developed and well-nourished. No distress.  HENT:  Head: Normocephalic.  Eyes: Conjunctivae are normal. Pupils are equal, round, and reactive to light. No scleral icterus.  Neck: Normal range of motion. Neck supple. No thyromegaly present.  Cardiovascular: Normal rate and regular rhythm.  Exam reveals no gallop and no friction rub.   Murmur heard. Pulmonary/Chest: Effort normal and breath sounds normal. No respiratory distress. He has no wheezes. He has no rales. He exhibits tenderness.  Slight tenderness to left chest with movement and palpation.   Abdominal: Soft. Bowel sounds are normal. He exhibits no distension. There is no tenderness. There is no rebound.  Musculoskeletal: Normal range of motion. He exhibits no edema.  Neurological: He is alert and oriented to person, place, and time.  Skin: Skin is warm and dry. No rash noted.  Psychiatric: He has a normal mood and affect. His behavior is normal.    ED Course  Procedures  DIAGNOSTIC STUDIES: Oxygen Saturation is 100% on RA, normal by my interpretation.    COORDINATION OF CARE: 9:02 PM Discussed treatment plan with pt at bedside and pt agreed to plan. Will order cardiac workup.    Labs Review Labs Reviewed  BASIC METABOLIC PANEL - Abnormal; Notable for the following:    Calcium 8.6 (*)    All other components within normal limits  CBC WITH DIFFERENTIAL/PLATELET - Abnormal; Notable for the following:    Platelets 147 (*)    All other components within normal limits  TROPONIN I    Imaging Review Dg Chest 2 View  09/30/2015   CLINICAL DATA:  Midsternal chest pain starting this morning. History of prostate cancer.  EXAM: CHEST  2 VIEW  COMPARISON:   08/17/2015  FINDINGS: Normal heart size and pulmonary vascularity. Pulmonary hyperinflation. No focal airspace disease or consolidation. No blunting of costophrenic angles. No pneumothorax. Mediastinal contours appear intact. No significant change since previous study.  IMPRESSION: Hyperinflation.  No evidence of active pulmonary disease.   Electronically Signed   By: Lucienne Capers M.D.   On: 09/30/2015 21:57   I have personally reviewed and evaluated these images and lab results as part of my medical decision-making.   EKG Interpretation   Date/Time:  Sunday September 30 2015 20:50:09 EDT Ventricular Rate:  68 PR Interval:  161 QRS Duration: 104 QT Interval:  387 QTC Calculation: 411 R Axis:   53 Text Interpretation:  Ectopic atrial rhythm RSR' in V1 or V2, right VCD or  RVH Left ventricular hypertrophy Confirmed by Jeneen Rinks  MD, [place] ([id number]) on  09/30/2015 9:03:34 PM      MDM   Final diagnoses:  Chest wall pain    EKG suggests  LVH. But no elevations noted. After more than 9 hours of symptoms the patient's troponin is normal. He has normal chest x-ray. He is not tachycardic febrile or hypoxemic. His pain is reproducible with movement and consistent with chest wall pain. Plan is anti-inflammatory. Given cautions to return here with any new or worsening symptoms including shortness of breath, lightheadedness, dizziness, worsening pain, fever, or other changes.  Tanna Furry, MD 09/30/15 2237

## 2015-09-30 NOTE — Assessment & Plan Note (Signed)
Again reviewed approp use of otc's > see avs

## 2015-09-30 NOTE — ED Notes (Signed)
PAtient complaining of chest pain that started this morning. Denies vomiting, some nausea.

## 2015-10-01 ENCOUNTER — Ambulatory Visit (INDEPENDENT_AMBULATORY_CARE_PROVIDER_SITE_OTHER): Payer: Medicare Other | Admitting: Family Medicine

## 2015-10-01 ENCOUNTER — Encounter: Payer: Self-pay | Admitting: Family Medicine

## 2015-10-01 VITALS — BP 128/78 | HR 76 | Temp 98.1°F | Resp 14 | Ht 74.0 in | Wt 182.0 lb

## 2015-10-01 DIAGNOSIS — R0789 Other chest pain: Secondary | ICD-10-CM

## 2015-10-01 DIAGNOSIS — I498 Other specified cardiac arrhythmias: Secondary | ICD-10-CM

## 2015-10-01 DIAGNOSIS — I499 Cardiac arrhythmia, unspecified: Secondary | ICD-10-CM

## 2015-10-01 DIAGNOSIS — I4510 Unspecified right bundle-branch block: Secondary | ICD-10-CM | POA: Diagnosis not present

## 2015-10-01 NOTE — Progress Notes (Signed)
Patient ID: Luis Stewart, male   DOB: Feb 26, 1951, 64 y.o.   MRN: [id number]   Subjective:    Patient ID: Luis Stewart, male    DOB: 1951/07/10, 64 y.o.   MRN: [id number]  Patient presents for ER F/U   Patient here for follow-up the ER. Yesterday when he was getting ready for church she began having some substernal chest pain which was intermittent. It then started to persist for a few hours therefore he went to the emergency room. EKG was done which showed LVH as well as some ectopic atrial arrhythmia but troponin was negative. Chest x-ray was done which is unremarkable labs were unremarkable. He was discharged home with Naprosyn for possible inflammation in the chest. He continues to have a dull chest pain that is not changed with exertion or eating. He does admit that he has had some stress recently. He had some mild nausea with the chest pain yesterday but that is now resolved. He does not have any new medications.    Review Of Systems:  GEN- denies fatigue, fever, weight loss,weakness, recent illness HEENT- denies eye drainage, change in vision, nasal discharge, CVS- + chest pain, Denies  palpitations RESP- denies SOB, cough, wheeze ABD- denies N/V, change in stools, abd pain GU- denies dysuria, hematuria, dribbling, incontinence MSK- denies joint pain, muscle aches, injury Neuro- denies headache, dizziness, syncope, seizure activity       Objective:    BP 128/78 mmHg  Pulse 76  Temp(Src) 98.1 F (36.7 C) (Oral)  Resp 14  Ht 6\' 2"  (1.88 m)  Wt 182 lb (82.555 kg)  BMI 23.36 kg/m2 GEN- NAD, alert and oriented x3 HEENT- PERRL, EOMI, non injected sclera, pink conjunctiva, MMM, oropharynx clear Neck- Supple, no thyromegaly CVS- RRR, no murmur RESP-CTAB ABD-NABS,soft,NT,ND EXT- No edema Pulses- Radial, DP- 2+  EKG- LVH, ? RBBB, atrial arrythmia, HR 56      Assessment & Plan:      Problem List Items Addressed This Visit    None    Visit Diagnoses    Other  chest pain    -  Primary     I'm concerned about his EKG with his atrial arrhythmia as well as initially more LVH now with no branch block abdomen abdomen evaluated by cardiology tomorrow  As he still has some dull chest pain. He does not have any other cardiac risk factors.  Note he has been under some stress recently with the care of both of his parents.    Relevant Orders    EKG 12-Lead (Completed)    Ambulatory referral to Cardiology    RBBB        Relevant Orders    Ambulatory referral to Cardiology    Atrial arrhythmia        Relevant Orders    Ambulatory referral to Cardiology       Note: This dictation was prepared with Dragon dictation along with smaller phrase technology. Any transcriptional errors that result from this process are unintentional.

## 2015-10-01 NOTE — Patient Instructions (Addendum)
Cardiology appt tomorrow-  Continue current medications If chest pain worsens go back to ER tonight F/U as previous

## 2015-10-02 ENCOUNTER — Encounter: Payer: Self-pay | Admitting: Cardiology

## 2015-10-02 ENCOUNTER — Ambulatory Visit (INDEPENDENT_AMBULATORY_CARE_PROVIDER_SITE_OTHER): Payer: Medicare Other | Admitting: Cardiology

## 2015-10-02 VITALS — BP 126/74 | HR 67 | Ht 74.0 in | Wt 178.0 lb

## 2015-10-02 DIAGNOSIS — R9431 Abnormal electrocardiogram [ECG] [EKG]: Secondary | ICD-10-CM

## 2015-10-02 DIAGNOSIS — E7800 Pure hypercholesterolemia, unspecified: Secondary | ICD-10-CM | POA: Diagnosis not present

## 2015-10-02 DIAGNOSIS — K30 Functional dyspepsia: Secondary | ICD-10-CM

## 2015-10-02 DIAGNOSIS — I2510 Atherosclerotic heart disease of native coronary artery without angina pectoris: Secondary | ICD-10-CM | POA: Diagnosis not present

## 2015-10-02 DIAGNOSIS — R1013 Epigastric pain: Secondary | ICD-10-CM

## 2015-10-02 DIAGNOSIS — R072 Precordial pain: Secondary | ICD-10-CM | POA: Diagnosis not present

## 2015-10-02 NOTE — Patient Instructions (Addendum)
Medication Instructions:  Your physician recommends that you continue on your current medications as directed. Please refer to the Current Medication list given to you today.  Labwork: None ordered  Testing/Procedures: Your physician has requested that you have an echocardiogram. Echocardiography is a painless test that uses sound waves to create images of your heart. It provides your doctor with information about the size and shape of your heart and how well your heart's chambers and valves are working. This procedure takes approximately one hour. There are no restrictions for this procedure.  Your physician has requested that you have en exercise stress myoview. For further information please visit [email or web address]. Please follow instruction sheet, as given.   Follow-Up:To be determined   Any Other Special Instructions Will Be Listed Below (If Applicable). Thank you for choosing [facility]!!

## 2015-10-02 NOTE — Progress Notes (Signed)
Cardiology Office Note  Date: 10/02/2015   ID: KHRIZ LIDDY, DOB 1951/05/10, MRN [id number]  PCP: Vic Blackbird, MD  Consulting Cardiologist: Rozann Lesches, MD   Chief Complaint  Patient presents with  . Atrial Fibrillation    History of Present Illness: Luis Stewart is a 64 y.o. male referred for cardiology consultation by Dr. Buelah Manis. Record review finds that the patient was just seen in the ER on October 9 with chest discomfort. He was reported to have some tenderness to palpation and movement, felt to be possibly inflammatory in etiology - he was prescribed Naprosyn. Troponin I level was normal. Chest x-ray reported hyperinflation with no active process. ECG showed a possible ectopic atrial bradycardia with incomplete right bundle branch block. He was seen by Dr. Buelah Manis yesterday and referred for further evaluation.  I see that he was also recently evaluated by Dr. Melvyn Novas for a prior history of asbestos exposure, previously worked for [employer]. Chest CT is reviewed below, did not demonstrate any asbestos related pleural disease or interstitial lung disease. There was a 4 mm right middle lobe nodule, most likely benign in appearance. Also atherosclerosis was noted within the aorta and mediastinal great vessels, including the coronary arteries, with a specific mention of plaque within the LAD.  He comes in today with his wife. He states that he developed a "heavy, sharp" discomfort in his central chest and epigastric region on Sunday morning while preparing to go to church. He does not recall any specific precipitant. He went to church, came back home, then went to visit his elderly mother in a nursing home. He states that his chest discomfort was "nagging" at that time and never actually went away, so he went to the ER for assessment. He states that it resolved on its own. He still has a "nagging" feeling in his chest but it is very mild, somewhat worse if he twists his  thorax at times. There has been no clear exertional component to his symptoms.  He remains active, prior to the onset of these recent symptoms he was walking each day for exercise, has a FItbit and tries to get at least 10,000 steps a day. 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 He also remains active volunteering in the community.   Past Medical History  Diagnosis Date  . History of fracture of leg     Right, childhood  . Hx of migraine headaches   . Neuropathy ([place])   . Hyperlipidemia   . Asbestosis ([place])   . 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 Biopsy prostate  2003 and 2005  . Colonoscopy  11/28/2008    Dr. Rourk:internal hemorrhoids/tortus colon/ascending colon polyps, tubular adenomas  . Colonoscopy N/A 09/04/2014    Procedure: COLONOSCOPY;  Surgeon: Daneil Dolin, MD;  Location: [facility];  Service: Endoscopy;  Laterality: N/A;  9:30    Current Outpatient Prescriptions  Medication Sig Dispense Refill  . cetirizine (ZYRTEC) 10 MG tablet Take 10 mg by mouth daily as needed for allergies.    . Multiple Vitamins-Minerals (MULTIVITAMIN PO) Take 1 tablet by mouth daily. Centrum Silver for Men    . Vitamin D, Ergocalciferol, (DRISDOL) 50000 UNITS CAPS capsule Take (1) capsule by mouth every week x12 weeks, then stop. (Patient taking differently: Take 50,000 Units by mouth every 7 (seven)  days. Take (1) capsule by mouth every week x12 weeks, then stop.) 12 capsule 0   No current facility-administered medications for this visit.    Allergies:  Review of patient's allergies indicates no known allergies.   Social History: The patient  reports that he has never smoked. He has never used smokeless tobacco. He reports that he does not drink alcohol or use illicit drugs.   Family History: The patient's family history includes Arthritis in his sister;  Deep vein thrombosis in his father; Dementia in his father; Diabetes in his mother; Heart disease in his father and mother; Hypertension in his mother and sister; Mental illness in his mother; Obesity in his mother and sister. There is no history of Colon cancer.   ROS:  Please see the history of present illness. Otherwise, complete review of systems is positive for intermittent indigestion.  All other systems are reviewed and negative.   Physical Exam: VS:  BP 126/74 mmHg  Pulse 67  Ht 6\' 2"  (1.88 m)  Wt 178 lb (80.74 kg)  BMI 22.84 kg/m2  SpO2 98%, BMI Body mass index is 22.84 kg/(m^2).  Wt Readings from Last 3 Encounters:  10/02/15 178 lb (80.74 kg)  10/01/15 182 lb (82.555 kg)  09/30/15 180 lb (81.647 kg)     General: Tall male, appears comfortable at rest. HEENT: Conjunctiva and lids normal, oropharynx clear. Neck: Supple, no elevated JVP or carotid bruits, no thyromegaly. Lungs: Clear to auscultation, nonlabored breathing at rest. Cardiac: Regular rate and rhythm, no S3 or significant systolic murmur, no pericardial rub. Abdomen: Soft, nontender, bowel sounds present, no guarding or rebound. Extremities: No pitting edema, distal pulses 2+. Skin: Warm and dry. Musculoskeletal: No kyphosis. Neuropsychiatric: Alert and oriented x3, affect grossly appropriate.   ECG: Recent tracings reviewed showing probable ectopic atrial bradycardia at 56 bpm with incomplete right branch block and repolarization abnormalities.  Recent Labwork: 08/17/2015: ALT 12; AST 19 09/30/2015: BUN 19; Creatinine, Ser 1.14; Hemoglobin 15.1; Platelets 147*; Potassium 4.3; Sodium 142     Component Value Date/Time   CHOL 195 08/17/2015 0856   TRIG 61 08/17/2015 0856   HDL 58 08/17/2015 0856   CHOLHDL 3.4 08/17/2015 0856   VLDL 12 08/17/2015 0856   LDLCALC 125 08/17/2015 0856    Other Studies Reviewed Today:  Chest CT 09/27/2015: TECHNIQUE: Multidetector CT imaging of the chest was performed  following the standard protocol without intravenous contrast. High resolution imaging of the lungs, as well as inspiratory and expiratory imaging, was performed.  COMPARISON: No prior chest CT. 08/17/2015 chest radiograph. 07/28/2002 CT abdomen/pelvis.  FINDINGS: Mediastinum/Nodes: Normal heart size. No pericardial fluid/thickening. There is atherosclerosis of the thoracic aorta, the great vessels of the mediastinum and the coronary arteries, including calcified atherosclerotic plaque in the left anterior descending coronary artery. Great vessels are normal in course and caliber. Normal visualized thyroid. Normal esophagus. No axillary, mediastinal or gross hilar lymphadenopathy, noting limited sensitivity for the detection of hilar adenopathy on this noncontrast study.  Lungs/Pleura: No pneumothorax. No pleural effusion. No calcified pleural plaques are seen. No appreciable centrilobular or paraseptal emphysema. There is relatively symmetric minimal subpleural consolidation and minimal bronchiectasis at the lung apices, in keeping with minimal biapical pleural-parenchymal scarring. There is a subpleural 4 mm right middle lobe pulmonary nodule (series 5/image 41) with a triangular configuration suggestive of minimal focal scarring or atelectasis. No acute consolidative airspace disease or lung masses. There are no significant regions of subpleural reticulation, ground-glass attenuation, traction bronchiectasis, parenchymal banding  or frank honeycombing. There is mild air trapping on the expiration sequence, most prominent in the right middle lobe and anterior upper lobes.  Upper abdomen: There are innumerable simple cysts scattered throughout the visualized liver (greater than 20), largest 4.1 cm in the posterior right liver lobe. There are additional subcentimeter liver lesions scattered throughout the visualized liver, which are too small to  characterize.  Musculoskeletal: No aggressive appearing focal osseous lesions.  IMPRESSION: 1. No evidence of asbestos related pleural disease. 2. No evidence of interstitial lung disease. Specifically, no significant regions of subpleural reticulation, ground-glass attenuation, traction bronchiectasis or frank honeycombing. 3. Mild air trapping, suggestive of nonspecific mild small airway disease. 4. Solitary 4 mm right middle lobe pulmonary nodule with a benign appearance. If the patient is at high risk for bronchogenic carcinoma, follow-up chest CT at 1 year is recommended. If the patient is at low risk, no follow-up is needed. This recommendation follows the consensus statement: Guidelines for Management of Small Pulmonary Nodules Detected on CT Scans: A Statement from the [place] as published in Radiology 2005; 237:395-400.  ASSESSMENT AND PLAN:  1. Atypical chest discomfort as outlined above, recent onset. ECG shows probable ectopic atrial bradycardia with incomplete right bundle branch block pattern - not entirely clear that this would be expected to produce any particular symptoms in and of itself at this point however. He has not noticed any recent functional limitation or reproducible exertional chest discomfort prior to the onset of symptoms on Sunday. Complicating matters is recent CT scan of the chest which did demonstrate coronary atherosclerosis, particularly in the LAD distribution. Family history includes CAD, although not technically premature disease. Recent LDL was 125. I discussed this information with the patient and his wife. Plan is to obtain an echocardiogram and an exercise Cardiolite for further cardiac risk stratification an objective evaluation of cardiac structure and ischemia.  2. Recent LDL 125, he has not been on specific medical treatment for hyperlipidemia.  3. History of exposure to asbestos, previously worked for [employer]. He retired in  2003. Evaluation by Dr. Melvyn Novas noted with PFTs and recent chest CT, overall reassuring results.  4. Intermittent indigestion symptoms.   5. Coronary atherosclerosis documented by recent chest CT imaging. As noted above, further evaluation planned to assess for objective ischemia. Even if testing is reassuring, presence of atherosclerosis would argue for more aggressive management of lipids, initiation of aspirin and statin therapy ideally.  Current medicines were reviewed at length with the patient today.   Orders Placed This Encounter  Procedures  . NM Myocar Multi W/Spect W/Wall Motion / EF  . Myocardial Perfusion Imaging  . Echocardiogram    Disposition: Call with results.   Signed, Satira Sark, MD, [facility] 10/02/2015 1:26 PM    [facility] [address] [place], [place] [address] Phone: [phone]; Fax: [phone]

## 2015-10-04 ENCOUNTER — Telehealth: Payer: Self-pay | Admitting: Internal Medicine

## 2015-10-04 NOTE — Telephone Encounter (Signed)
Result Note     Call patient : Study is unremarkable, no change in recs - no evidence of asbestosis (and the lung nodule is benign by rads criteria)  --   I spoke with patient about results and he verbalized understanding. Pt reports he was never told he had a lung nodule. Wants to know how long has he had this? Wants to know if the scarring is what the lung nodule is? Please advise MW thanks

## 2015-10-04 NOTE — Telephone Encounter (Signed)
Yes, likely a scar and the guidlelines on one this small with no smoking hx = no further ct's needed

## 2015-10-04 NOTE — Telephone Encounter (Signed)
Patient notified.  No questions or concerns at this time. Nothing further needed.   

## 2015-10-05 ENCOUNTER — Ambulatory Visit (HOSPITAL_COMMUNITY): Admission: RE | Admit: 2015-10-05 | Payer: Self-pay | Source: Ambulatory Visit

## 2015-10-05 ENCOUNTER — Encounter (HOSPITAL_COMMUNITY)
Admission: RE | Admit: 2015-10-05 | Discharge: 2015-10-05 | Disposition: A | Discharge status: Home / Self Care | Payer: Medicare Other | Source: Ambulatory Visit | Attending: Cardiology | Admitting: Cardiology

## 2015-10-05 ENCOUNTER — Encounter (HOSPITAL_COMMUNITY): Payer: Self-pay

## 2015-10-05 ENCOUNTER — Ambulatory Visit (HOSPITAL_COMMUNITY)
Admission: RE | Admit: 2015-10-05 | Discharge: 2015-10-05 | Disposition: A | Discharge status: Home / Self Care | Payer: Medicare Other | Source: Ambulatory Visit | Attending: Cardiology | Admitting: Cardiology

## 2015-10-05 ENCOUNTER — Telehealth: Payer: Self-pay | Admitting: Family Medicine

## 2015-10-05 ENCOUNTER — Encounter (HOSPITAL_BASED_OUTPATIENT_CLINIC_OR_DEPARTMENT_OTHER)
Admission: RE | Admit: 2015-10-05 | Discharge: 2015-10-05 | Disposition: A | Discharge status: Home / Self Care | Payer: Medicare Other | Source: Ambulatory Visit | Attending: Cardiology | Admitting: Cardiology

## 2015-10-05 DIAGNOSIS — I2510 Atherosclerotic heart disease of native coronary artery without angina pectoris: Secondary | ICD-10-CM | POA: Diagnosis not present

## 2015-10-05 DIAGNOSIS — R9431 Abnormal electrocardiogram [ECG] [EKG]: Secondary | ICD-10-CM

## 2015-10-05 DIAGNOSIS — K7689 Other specified diseases of liver: Secondary | ICD-10-CM

## 2015-10-05 DIAGNOSIS — R072 Precordial pain: Secondary | ICD-10-CM

## 2015-10-05 LAB — NM MYOCAR MULTI W/SPECT W/WALL MOTION / EF
CHL CUP NUCLEAR SDS: 0
CHL CUP NUCLEAR SRS: 2
CHL CUP NUCLEAR SSS: 2
CHL RATE OF PERCEIVED EXERTION: 15
CSEPHR: 94 %
Estimated workload: 10.1 METS
Exercise duration (min): 8 min
Exercise duration (sec): 44 s
LHR: 0.3
LVDIAVOL: 79 mL
LVSYSVOL: 26 mL
MPHR: 155 {beats}/min
Peak HR: 146 {beats}/min
Rest HR: 55 {beats}/min
TID: 0.96

## 2015-10-05 MED ORDER — SODIUM CHLORIDE 0.9 % IJ SOLN
INTRAMUSCULAR | Status: AC
  Administered 2015-10-05: 10 mL via INTRAVENOUS
  Filled 2015-10-05: qty 3

## 2015-10-05 MED ORDER — REGADENOSON 0.4 MG/5ML IV SOLN
INTRAVENOUS | Status: AC
  Filled 2015-10-05: qty 5

## 2015-10-05 MED ORDER — TECHNETIUM TC 99M SESTAMIBI GENERIC - CARDIOLITE
10.0000 | Freq: Once | INTRAVENOUS | Status: AC | PRN
  Administered 2015-10-05: 10 via INTRAVENOUS

## 2015-10-05 MED ORDER — TECHNETIUM TC 99M SESTAMIBI - CARDIOLITE
30.0000 | Freq: Once | INTRAVENOUS | Status: AC | PRN
  Administered 2015-10-05: 10:00:00 30 via INTRAVENOUS

## 2015-10-05 NOTE — Telephone Encounter (Signed)
Patient returned call and states that he had CT of chest and abd performed by Dr. Melvyn Novas.   States that he discussed findings of chest with Dr. Melvyn Novas, but he is concerned about liver lesions.   MD please advise.

## 2015-10-05 NOTE — Telephone Encounter (Signed)
Pt is calling to speak with Dr. Buelah Manis regarding his CT scan results from last week.   [phone]

## 2015-10-05 NOTE — Telephone Encounter (Signed)
Call placed to patient and patient made aware.   [place] ordered.

## 2015-10-05 NOTE — Telephone Encounter (Signed)
Based on CT scan from 2003 he had some small cyst in his liver then that were benign. I recommend RUQ Ultrasound to evaluate if their are changes- Dx- Liver cyst

## 2015-10-05 NOTE — Telephone Encounter (Signed)
Call placed to patient. LMTRC.  

## 2015-10-08 ENCOUNTER — Encounter: Payer: Self-pay | Admitting: *Deleted

## 2015-10-08 DIAGNOSIS — C61 Malignant neoplasm of prostate: Secondary | ICD-10-CM | POA: Diagnosis not present

## 2015-10-09 ENCOUNTER — Ambulatory Visit (INDEPENDENT_AMBULATORY_CARE_PROVIDER_SITE_OTHER): Payer: Medicare Other | Admitting: Urology

## 2015-10-09 DIAGNOSIS — C61 Malignant neoplasm of prostate: Secondary | ICD-10-CM | POA: Diagnosis not present

## 2015-10-09 DIAGNOSIS — N401 Enlarged prostate with lower urinary tract symptoms: Secondary | ICD-10-CM

## 2015-10-12 ENCOUNTER — Ambulatory Visit (HOSPITAL_COMMUNITY)
Admission: RE | Admit: 2015-10-12 | Discharge: 2015-10-12 | Disposition: A | Discharge status: Home / Self Care | Payer: Medicare Other | Source: Ambulatory Visit | Attending: Family Medicine | Admitting: Family Medicine

## 2015-10-12 DIAGNOSIS — K7689 Other specified diseases of liver: Secondary | ICD-10-CM

## 2015-11-06 ENCOUNTER — Ambulatory Visit: Payer: 59

## 2015-11-20 ENCOUNTER — Ambulatory Visit (INDEPENDENT_AMBULATORY_CARE_PROVIDER_SITE_OTHER): Payer: Medicare Other | Admitting: *Deleted

## 2015-11-20 DIAGNOSIS — Z23 Encounter for immunization: Secondary | ICD-10-CM | POA: Diagnosis not present

## 2015-11-26 ENCOUNTER — Other Ambulatory Visit: Payer: Self-pay | Admitting: Urology

## 2015-11-26 DIAGNOSIS — C61 Malignant neoplasm of prostate: Secondary | ICD-10-CM

## 2015-12-18 ENCOUNTER — Ambulatory Visit (HOSPITAL_COMMUNITY)
Admission: RE | Admit: 2015-12-18 | Discharge: 2015-12-18 | Disposition: A | Discharge status: Home / Self Care | Payer: Medicare Other | Source: Ambulatory Visit | Attending: Urology | Admitting: Urology

## 2015-12-18 DIAGNOSIS — N400 Enlarged prostate without lower urinary tract symptoms: Secondary | ICD-10-CM | POA: Insufficient documentation

## 2015-12-18 DIAGNOSIS — C61 Malignant neoplasm of prostate: Secondary | ICD-10-CM | POA: Diagnosis not present

## 2015-12-18 DIAGNOSIS — R972 Elevated prostate specific antigen [PSA]: Secondary | ICD-10-CM | POA: Insufficient documentation

## 2015-12-18 LAB — POCT I-STAT CREATININE: Creatinine, Ser: 1.1 mg/dL (ref 0.61–1.24)

## 2015-12-18 MED ORDER — GADOBENATE DIMEGLUMINE 529 MG/ML IV SOLN
20.0000 mL | Freq: Once | INTRAVENOUS | Status: AC | PRN
  Administered 2015-12-18: 17 mL via INTRAVENOUS

## 2015-12-19 ENCOUNTER — Other Ambulatory Visit: Payer: Self-pay | Admitting: Urology

## 2015-12-19 DIAGNOSIS — C61 Malignant neoplasm of prostate: Secondary | ICD-10-CM

## 2016-01-03 DIAGNOSIS — C61 Malignant neoplasm of prostate: Secondary | ICD-10-CM | POA: Diagnosis not present

## 2016-01-14 ENCOUNTER — Other Ambulatory Visit: Payer: Self-pay | Admitting: Urology

## 2016-01-14 DIAGNOSIS — C61 Malignant neoplasm of prostate: Secondary | ICD-10-CM

## 2016-01-22 ENCOUNTER — Ambulatory Visit (HOSPITAL_COMMUNITY)
Admission: RE | Admit: 2016-01-22 | Discharge: 2016-01-22 | Disposition: A | Discharge status: Home / Self Care | Payer: Medicare Other | Source: Ambulatory Visit | Attending: Urology | Admitting: Urology

## 2016-01-22 ENCOUNTER — Encounter (HOSPITAL_COMMUNITY): Payer: Self-pay

## 2016-01-22 DIAGNOSIS — R972 Elevated prostate specific antigen [PSA]: Secondary | ICD-10-CM | POA: Diagnosis not present

## 2016-01-22 DIAGNOSIS — C61 Malignant neoplasm of prostate: Secondary | ICD-10-CM | POA: Insufficient documentation

## 2016-01-22 MED ORDER — LIDOCAINE HCL (PF) 2 % IJ SOLN
10.0000 mL | Freq: Once | INTRAMUSCULAR | Status: AC
  Administered 2016-01-22: 10 mL

## 2016-01-22 MED ORDER — GENTAMICIN SULFATE 40 MG/ML IJ SOLN
160.0000 mg | Freq: Once | INTRAMUSCULAR | Status: AC
  Administered 2016-01-22: 160 mg via INTRAMUSCULAR

## 2016-01-22 MED ORDER — LIDOCAINE HCL (PF) 2 % IJ SOLN
INTRAMUSCULAR | Status: AC
  Administered 2016-01-22: 10 mL
  Filled 2016-01-22: qty 10

## 2016-01-22 MED ORDER — GENTAMICIN SULFATE 40 MG/ML IJ SOLN
INTRAMUSCULAR | Status: AC
  Filled 2016-01-22: qty 4

## 2016-01-22 NOTE — Discharge Instructions (Signed)
Transrectal Ultrasound-Guided Biopsy °A transrectal ultrasound-guided biopsy is a procedure to take samples of tissue from your prostate. Ultrasound images are used to guide the procedure. It is usually done to check the prostate gland for cancer. °BEFORE THE PROCEDURE °· Do not eat or drink after midnight on the night before your procedure. °· Take medicines as your doctor tells you. °· Your doctor may have you stop taking some medicines 5-7 days before the procedure. °· You will be given an enema before your procedure. During an enema, a liquid is put into your butt (rectum) to clear out waste. °· You may have lab tests the day of your procedure. °· Make plans to have someone drive you home. °PROCEDURE °· You will be given medicine to help you relax before the procedure. An IV tube will be put into one of your veins. It will be used to give fluids and medicine. °· You will be given medicine to reduce the risk of infection (antibiotic). °· You will be placed on your side. °· A probe with gel will be put in your butt. This is used to take pictures of your prostate and the area around it. °· A medicine to numb the area is put into your prostate. °· A biopsy needle is then inserted and guided to your prostate. °· Samples of prostate tissue are taken. The needle is removed. °· The samples are sent to a lab to be checked. Results are usually back in 2-3 days. °AFTER THE PROCEDURE °· You will be taken to a room where you will be watched until you are doing okay. °· You may have some pain in the area around your butt. You will be given medicines for this. °· You may be able to go home the same day. Sometimes, an overnight stay in the hospital is needed. °  °This information is not intended to replace advice given to you by your health care provider. Make sure you discuss any questions you have with your health care provider. °  °Document Released: 11/26/2009 Document Revised: 12/13/2013 Document Reviewed:  07/27/2013 °Elsevier Interactive Patient Education ©2016 [employer] ° °

## 2016-02-29 ENCOUNTER — Encounter: Payer: Self-pay | Admitting: Family Medicine

## 2016-02-29 ENCOUNTER — Ambulatory Visit (INDEPENDENT_AMBULATORY_CARE_PROVIDER_SITE_OTHER): Payer: Medicare Other | Admitting: Family Medicine

## 2016-02-29 VITALS — BP 114/72 | HR 80 | Temp 100.2°F | Resp 18 | Wt 182.0 lb

## 2016-02-29 DIAGNOSIS — M25561 Pain in right knee: Secondary | ICD-10-CM

## 2016-02-29 DIAGNOSIS — J069 Acute upper respiratory infection, unspecified: Secondary | ICD-10-CM

## 2016-02-29 DIAGNOSIS — R350 Frequency of micturition: Secondary | ICD-10-CM

## 2016-02-29 LAB — URINALYSIS, ROUTINE W REFLEX MICROSCOPIC
Bilirubin Urine: NEGATIVE
GLUCOSE, UA: NEGATIVE
HGB URINE DIPSTICK: NEGATIVE
Ketones, ur: NEGATIVE
LEUKOCYTES UA: NEGATIVE
NITRITE: NEGATIVE
PROTEIN: NEGATIVE
Specific Gravity, Urine: 1.015 (ref 1.001–1.035)
pH: 5.5 (ref 5.0–8.0)

## 2016-02-29 LAB — INFLUENZA A AND B AG, IMMUNOASSAY
INFLUENZA A ANTIGEN: NOT DETECTED
INFLUENZA B ANTIGEN: NOT DETECTED

## 2016-02-29 MED ORDER — CIPROFLOXACIN HCL 500 MG PO TABS: 500.0000 mg | ORAL_TABLET | Freq: Two times a day (BID) | ORAL | Status: DC

## 2016-02-29 NOTE — Progress Notes (Signed)
Patient ID: Luis Stewart, male   DOB: 1951-05-22, 65 y.o.   MRN: [id number]    Subjective:    Patient ID: Luis Stewart, male    DOB: 1951-05-23, 65 y.o.   MRN: [id number]  Patient presents for sick x 2 days  Pt here with fever, aching, dry cough runny nose, nauseas for past 2 days. Positive sick contacts with wife. No diarrhea, no rash.  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 Get pain in popliteal region, no swelling, no giving out    Review Of Systems:  GEN- + fatigue, fever, weight loss,weakness, recent illness HEENT- denies eye drainage, change in vision, +nasal discharge, CVS- denies chest pain, palpitations RESP- denies SOB, +cough, wheeze ABD- denies N/V, change in stools, abd pain GU- denies dysuria, hematuria, dribbling, incontinence MSK- + joint pain, muscle aches, injury Neuro- denies headache, dizziness, syncope, seizure activity       Objective:    BP 114/72 mmHg  Pulse 80  Temp(Src) 100.2 F (37.9 C)  Resp 18  Wt 182 lb (82.555 kg) GEN- NAD, alert and oriented x3,febrile HEENT- PERRL, EOMI, non injected sclera, pink conjunctiva, MMM, oropharynx clear, TM obscurred by wax, Nares clear rhinorrhea, no maxillary sinus tenderness  Neck- Supple, no LAD CVS- RRR, no murmur RESP-CTAB ABD-NABS,soft,NT,ND, NO CVA tenderness  MSK- Good ROM right knee, no effusion, no crepitus  EXT- No edema Pulses- Radial, 2+  Flu Neg      Assessment & Plan:      Problem List Items Addressed This Visit    None    Visit Diagnoses    Viral URI    -  Primary    Mucinex DM, supportive care     Relevant Orders    Influenza A and B Ag, Immunoassay (Completed)    Urinary frequency        possible due to prostatitis wth recent biopsy, culture sent, start  Cipro    Relevant Orders    Urinalysis, Routine w reflex microscopic (not at [facility]) (Completed)    Urine culture    Right knee pain        Xray of knee, to be done, likley underlying OA, no sign of bakers cyst    Relevant Orders    DG Knee Complete 4 Views Right       Note: This dictation was prepared with Dragon dictation along with smaller phrase technology. Any transcriptional errors that result from this process are unintentional.

## 2016-02-29 NOTE — Patient Instructions (Signed)
Get xray of knee Start antibiotics for prostate  Take mucinex DM, rest, fluids  F/U as previous

## 2016-03-01 LAB — URINE CULTURE
COLONY COUNT: NO GROWTH
Organism ID, Bacteria: NO GROWTH

## 2016-03-05 ENCOUNTER — Ambulatory Visit (HOSPITAL_COMMUNITY)
Admission: RE | Admit: 2016-03-05 | Discharge: 2016-03-05 | Disposition: A | Discharge status: Home / Self Care | Payer: Medicare Other | Source: Ambulatory Visit | Attending: Family Medicine | Admitting: Family Medicine

## 2016-03-05 DIAGNOSIS — M25561 Pain in right knee: Secondary | ICD-10-CM | POA: Diagnosis not present

## 2016-03-05 DIAGNOSIS — M1711 Unilateral primary osteoarthritis, right knee: Secondary | ICD-10-CM | POA: Insufficient documentation

## 2016-03-05 DIAGNOSIS — M179 Osteoarthritis of knee, unspecified: Secondary | ICD-10-CM | POA: Diagnosis not present

## 2016-03-12 ENCOUNTER — Telehealth: Payer: Self-pay | Admitting: Family Medicine

## 2016-03-12 ENCOUNTER — Encounter: Payer: Self-pay | Admitting: *Deleted

## 2016-03-12 NOTE — Telephone Encounter (Signed)
Patient is calling to get xray results  [phone]

## 2016-03-12 NOTE — Telephone Encounter (Signed)
Multiple calls placed to patient with no answer and no return call.   Message to be closed.  

## 2016-03-13 ENCOUNTER — Other Ambulatory Visit: Payer: Self-pay | Admitting: *Deleted

## 2016-03-13 DIAGNOSIS — M1710 Unilateral primary osteoarthritis, unspecified knee: Secondary | ICD-10-CM

## 2016-03-13 DIAGNOSIS — M179 Osteoarthritis of knee, unspecified: Secondary | ICD-10-CM

## 2016-03-21 DIAGNOSIS — M25561 Pain in right knee: Secondary | ICD-10-CM | POA: Diagnosis not present

## 2016-06-25 IMAGING — MR MR PROSTATE WO/W CM
23 of 53 series · 23 of 53 positions shown · IV contrast (yes)
Comparison: None.

CLINICAL DATA: Biopsy-proven prostate cancer, elevated PSA, known
BPH

EXAM:
MR PROSTATE WITHOUT AND WITH CONTRAST
TECHNIQUE: Multiplanar multisequence MRI images were obtained of the pelvis
centered about the prostate. Pre and post contrast images were
obtained.
CONTRAST:  17mL MULTIHANCE GADOBENATE DIMEGLUMINE 529 MG/ML IV SOLN

[Series 3: bSSFP fat-sat · axial · 6.0mm · 0.86mm/px · 1 of 44 slices shown]
[im 1/44]
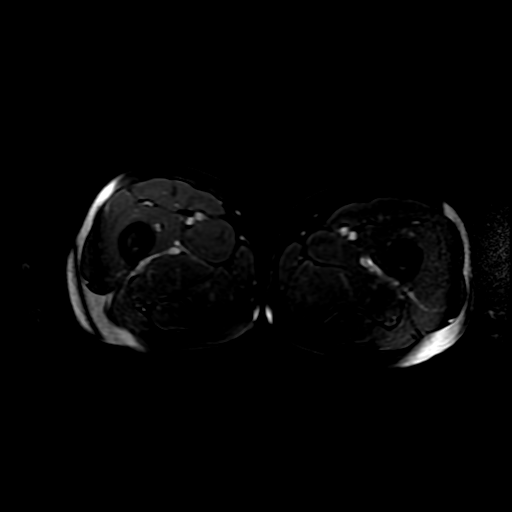

[Series 4: T1 · axial · 6.0mm · 0.86mm/px · 1 of 43 slices shown (1 of 2)]
[im 1/43]
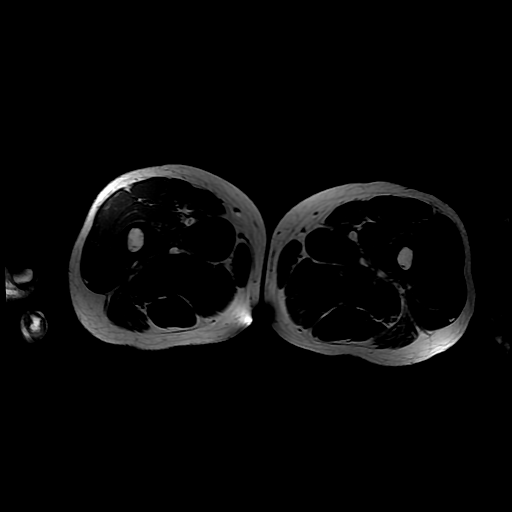

[Series 5: T2 · axial · 3.0mm · 0.29mm/px · 1 of 32 slices shown (1 of 3)]
[im 1/32]
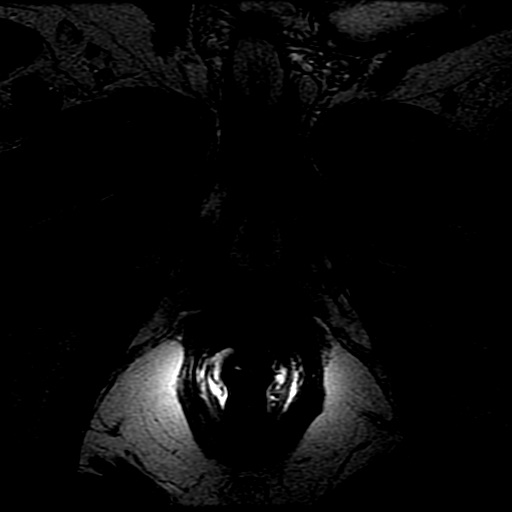

[Series 6: T1 · axial · 3.0mm · 0.29mm/px · 1 of 32 slices shown (2 of 2)]
[im 1/32]
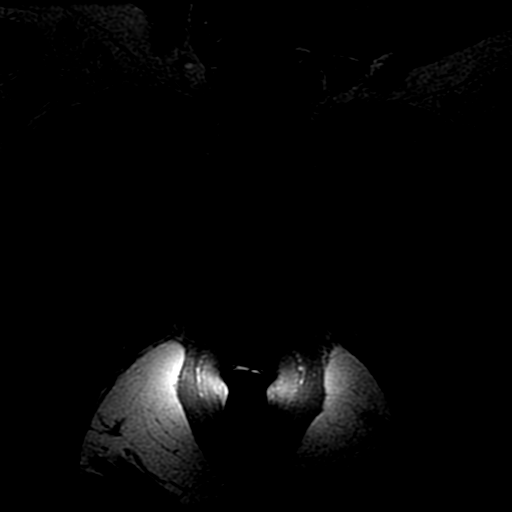

[Series 7: T2 · sagittal · 4.0mm · 0.29mm/px · 1 of 24 slices shown (2 of 3)]
[im 1/24]
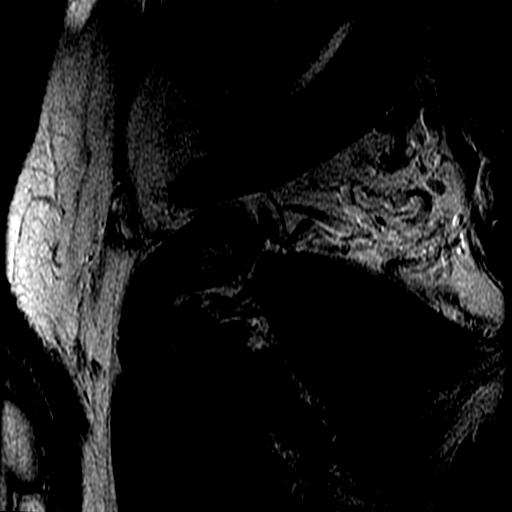

[Series 8: T2 · coronal · 4.0mm · 0.29mm/px · 1 of 24 slices shown (3 of 3)]
[im 1/24]
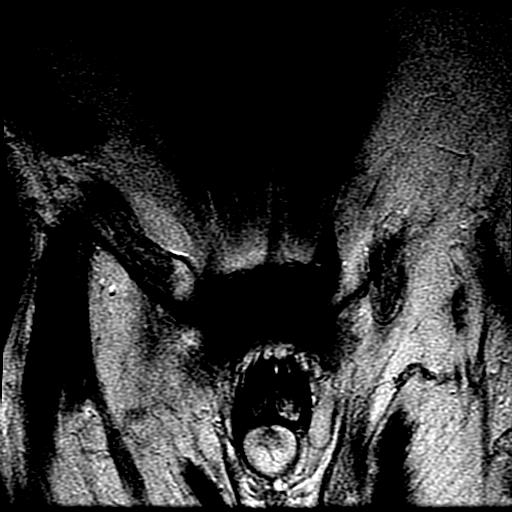

[Series 9: DWI · axial · 3.0mm · 0.59mm/px · 1 of 59 slices shown (1 of 6)]
[im 1/59]
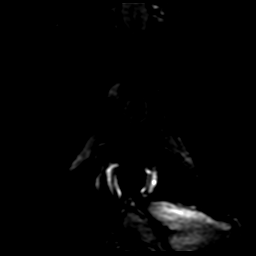

[Series 10: DWI · axial · 3.0mm · 0.59mm/px · 1 of 59 slices shown (2 of 6)]
[im 1/59]
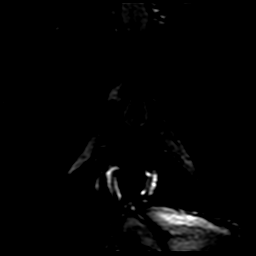

[Series 11: DWI · axial · 3.0mm · 0.59mm/px · 1 of 59 slices shown (3 of 6)]
[im 1/59]
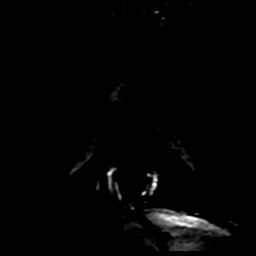

[Series 900: DWI · axial · 3.0mm · 0.59mm/px · 1 of 30 slices shown (4 of 6)]
[im 1/30]
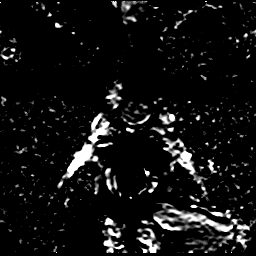

[Series 1000: DWI · axial · 3.0mm · 0.59mm/px · 1 of 30 slices shown (5 of 6)]
[im 1/30]
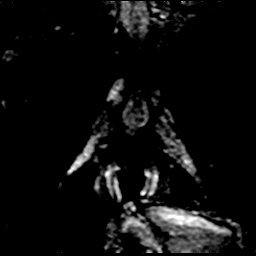

[Series 1100: DWI · axial · 3.0mm · 0.59mm/px · 1 of 30 slices shown (6 of 6)]
[im 1/30]
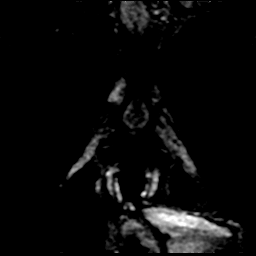

[((id)/(id)/1)-((id)/(id)/1) · axial · 3.0mm · 0.43mm/px · 1 of 54 slices shown (1 of 11)]
[im 1/54]
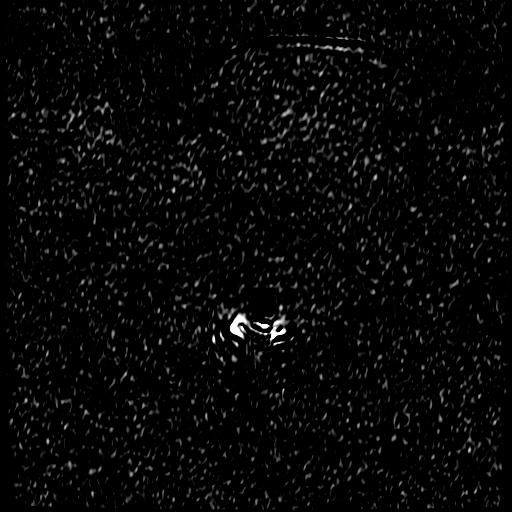

[((id)/(id)/1)-((id)/(id)/1) · axial · 3.0mm · 0.43mm/px · 1 of 76 slices shown (2 of 11)]
[im 1/76]
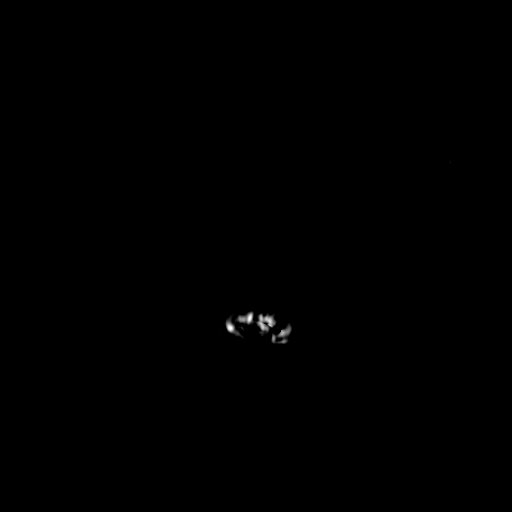

[((id)/(id)/1)-((id)/(id)/1) · axial · 3.0mm · 0.43mm/px · 1 of 69 slices shown (3 of 11)]
[im 1/69]
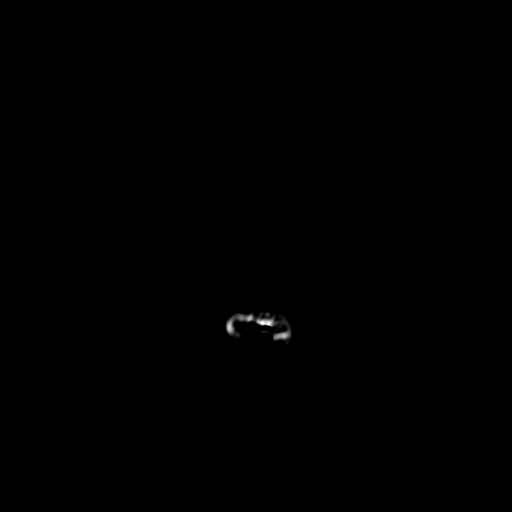

[((id)/(id)/1)-((id)/(id)/1) · axial · 3.0mm · 0.43mm/px · 1 of 68 slices shown (4 of 11)]
[im 1/68]
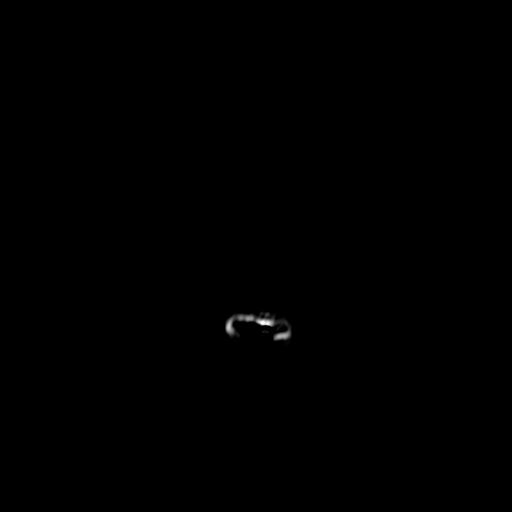

[((id)/(id)/1)-((id)/(id)/1) · axial · 3.0mm · 0.43mm/px · 1 of 69 slices shown (5 of 11)]
[im 1/69]
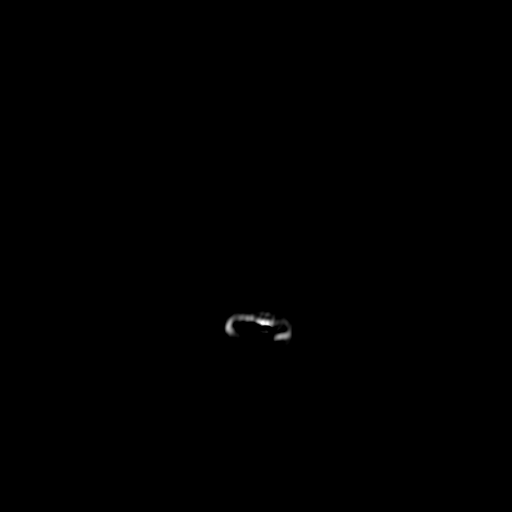

[((id)/(id)/1)-((id)/(id)/1) · axial · 3.0mm · 0.43mm/px · 1 of 70 slices shown (6 of 11)]
[im 1/70]
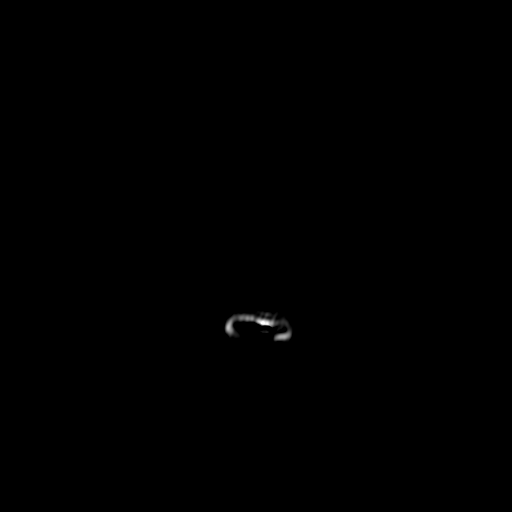

[((id)/(id)/1)-((id)/(id)/1) · axial · 3.0mm · 0.43mm/px · 1 of 70 slices shown (7 of 11)]
[im 1/70]
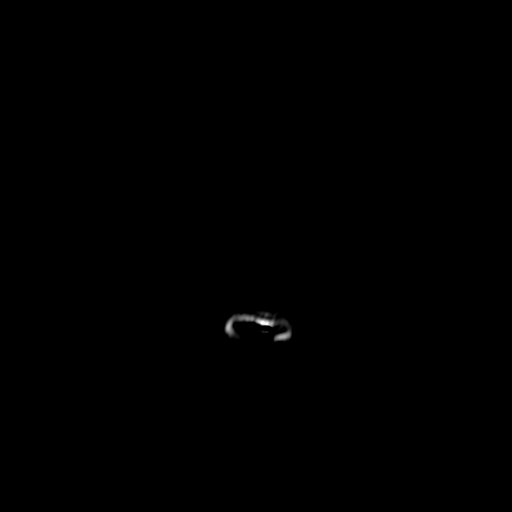

[((id)/(id)/1)-((id)/(id)/1) · axial · 3.0mm · 0.43mm/px · 1 of 70 slices shown (8 of 11)]
[im 1/70]
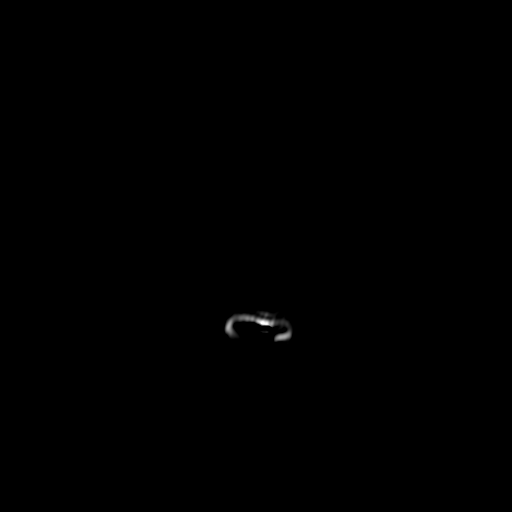

[((id)/(id)/1)-((id)/(id)/1) · axial · 3.0mm · 0.43mm/px · 1 of 70 slices shown (9 of 11)]
[im 1/70]
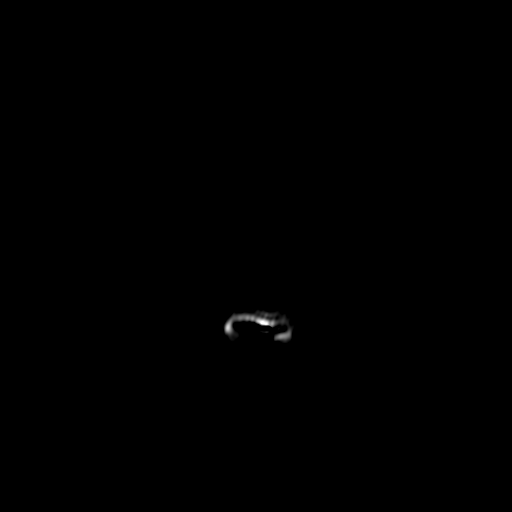

[((id)/(id)/1)-((id)/(id)/1) · axial · 3.0mm · 0.43mm/px · 1 of 70 slices shown (10 of 11)]
[im 1/70]
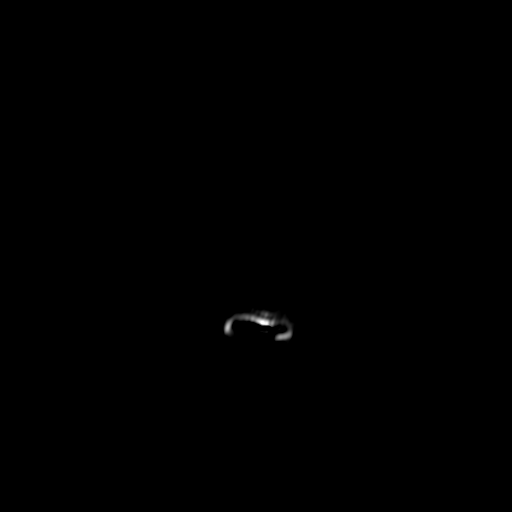

[((id)/(id)/1)-((id)/(id)/1) · axial · 3.0mm · 0.43mm/px · 1 of 71 slices shown (11 of 11)]
[im 1/71]
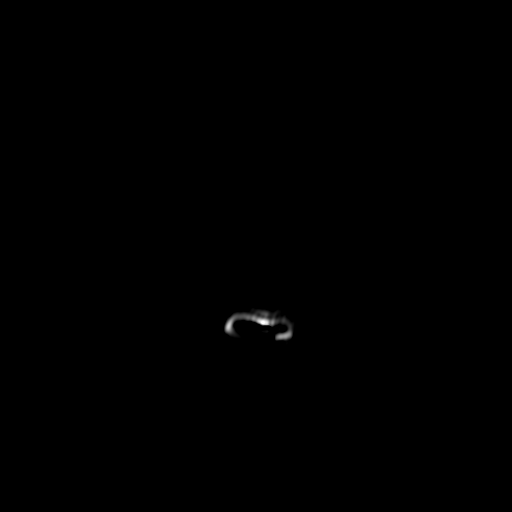

[23 of 53 positions shown; findings below may reference images not displayed]

FINDINGS: Prostate: Thinning of the peripheral gland.

Scattered areas of T1 hyperintensity in the peripheral gland (for
example, series 6/image 17), presumed to be related to post biopsy
hemorrhage. Corresponding geographic areas of vague T2
hypointensity.

Two focal areas of T1 hypointensity (series 6/image 18) demonstrate
normal T2 hyperintensity, reflecting areas of normal parenchyma
without hemorrhage.

No findings suspicious for high-grade macroscopic prostate cancer.
Specifically, no restricted diffusion.

Enlargement/nodularity of the central gland, compatible with known
BPH. Dominant central gland nodule indenting the base of the bladder
(series 5/image 8). Additional 2.6 x 2.1 cm exophytic cystic nodule
along the right lateral apex (series 5/image 26) with septations and
layering complex fluid/hemorrhage (series 5/image 27). No suspicious
central gland nodule worrisome for tumor.

Transcapsular spread:  Absent.

Seminal vesicle involvement: Absent.

Neurovascular bundle involvement: Absent.

Pelvic adenopathy: Absent.

Bone metastasis: Absent.

Other findings: Bladder is thick-walled although underdistended.
IMPRESSION: No findings specific for macroscopic prostate cancer. Suspected post
biopsy hemorrhage within the peripheral gland.

Enlargement/nodularity of the central gland, compatible with known
BPH. Dominant 2.6 cm cystic central gland nodule along the right
lateral apex.

## 2016-09-26 DIAGNOSIS — M25561 Pain in right knee: Secondary | ICD-10-CM | POA: Diagnosis not present

## 2016-10-15 ENCOUNTER — Ambulatory Visit (INDEPENDENT_AMBULATORY_CARE_PROVIDER_SITE_OTHER): Payer: Medicare Other | Admitting: Family Medicine

## 2016-10-15 ENCOUNTER — Encounter: Payer: Self-pay | Admitting: Family Medicine

## 2016-10-15 VITALS — BP 132/60 | HR 60 | Temp 98.9°F | Resp 14 | Ht 74.0 in | Wt 186.0 lb

## 2016-10-15 DIAGNOSIS — Z0000 Encounter for general adult medical examination without abnormal findings: Secondary | ICD-10-CM | POA: Diagnosis not present

## 2016-10-15 DIAGNOSIS — Z1159 Encounter for screening for other viral diseases: Secondary | ICD-10-CM | POA: Diagnosis not present

## 2016-10-15 DIAGNOSIS — E785 Hyperlipidemia, unspecified: Secondary | ICD-10-CM | POA: Diagnosis not present

## 2016-10-15 DIAGNOSIS — H6123 Impacted cerumen, bilateral: Secondary | ICD-10-CM

## 2016-10-15 DIAGNOSIS — Z23 Encounter for immunization: Secondary | ICD-10-CM

## 2016-10-15 DIAGNOSIS — E559 Vitamin D deficiency, unspecified: Secondary | ICD-10-CM | POA: Diagnosis not present

## 2016-10-15 LAB — COMPREHENSIVE METABOLIC PANEL
ALBUMIN: 3.8 g/dL (ref 3.6–5.1)
ALT: 14 U/L (ref 9–46)
AST: 20 U/L (ref 10–35)
Alkaline Phosphatase: 102 U/L (ref 40–115)
BILIRUBIN TOTAL: 0.7 mg/dL (ref 0.2–1.2)
BUN: 15 mg/dL (ref 7–25)
CO2: 30 mmol/L (ref 20–31)
CREATININE: 1.18 mg/dL (ref 0.70–1.25)
Calcium: 9.3 mg/dL (ref 8.6–10.3)
Chloride: 105 mmol/L (ref 98–110)
Glucose, Bld: 94 mg/dL (ref 70–99)
Potassium: 4.4 mmol/L (ref 3.5–5.3)
SODIUM: 141 mmol/L (ref 135–146)
TOTAL PROTEIN: 6.4 g/dL (ref 6.1–8.1)

## 2016-10-15 LAB — LIPID PANEL
Cholesterol: 193 mg/dL (ref 125–200)
HDL: 51 mg/dL (ref >=40)
LDL CALC: 128 mg/dL (ref <130)
TRIGLYCERIDES: 69 mg/dL (ref <150)
Total CHOL/HDL Ratio: 3.8 Ratio (ref <=5.0)
VLDL: 14 mg/dL (ref <30)

## 2016-10-15 LAB — CBC WITH DIFFERENTIAL/PLATELET
BASOS PCT: 0 %
Basophils Absolute: 0 cells/uL (ref 0–200)
EOS PCT: 4 %
Eosinophils Absolute: 120 cells/uL (ref 15–500)
HCT: 43.5 % (ref 38.5–50.0)
HEMOGLOBIN: 14.7 g/dL (ref 13.0–17.0)
LYMPHS ABS: 990 {cells}/uL (ref 850–3900)
Lymphocytes Relative: 33 %
MCH: 30 pg (ref 27.0–33.0)
MCHC: 33.8 g/dL (ref 32.0–36.0)
MCV: 88.8 fL (ref 80.0–100.0)
MPV: 11.3 fL (ref 7.5–12.5)
Monocytes Absolute: 180 cells/uL — ABNORMAL LOW (ref 200–950)
Monocytes Relative: 6 %
NEUTROS ABS: 1710 {cells}/uL (ref 1500–7800)
Neutrophils Relative %: 57 %
Platelets: 157 10*3/uL (ref 140–400)
RBC: 4.9 MIL/uL (ref 4.20–5.80)
RDW: 15 % (ref 11.0–15.0)
WBC: 3 10*3/uL — AB (ref 3.8–10.8)

## 2016-10-15 NOTE — Assessment & Plan Note (Addendum)
CPE done, Pneumo Vaccine 23 given, flu shot given Fasting labs Vitamin D def history on OTC supplement recheck level F/u with pulmonary for year PFT and Urology for Prostate cancer surveillance  S/p irrigation at bedside for cerumen impaction  Discussed Hep C screening

## 2016-10-15 NOTE — Patient Instructions (Signed)
F/U 1 year or as needed I recommend eye visit once a year I recommend dental visit every 6 months Goal is to  Exercise 30 minutes 5 days a week We will send a letter with lab results

## 2016-10-15 NOTE — Progress Notes (Signed)
Subjective:   Patient presents for Medicare Annual/Subsequent preventive examination.    Probably has been doing well. He still on surveillance for his prostate cancer by urology. He still following with orthopedics for his right knee osteoarthritis he needs knee replacement but is trying to prolong this is much as possible. He is still followed by pulmonary for his exposure to his spasticity needs Provera function tests yearly. He still gets some discomfort in his chest at times typically happens when he is more stressed out. There's been no active disease in his lungs he's had stress testing by cardiology and other workup which has been negative. 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 He tries to be stressed by bowling and doing outside yardwork which he enjoys.  Review Past Medical/Family/Social: Per EMR    Risk Factors  Current exercise habits:  Dietary issues discussed:   Cardiac risk factors: family history,mild hyperlipidemia   Depression Screen  (Note: if answer to either of the following is "Yes", a more complete depression screening is indicated)  Over the past two weeks, have you felt down, depressed or hopeless? No Over the past two weeks, have you felt little interest or pleasure in doing things? No Have you lost interest or pleasure in daily life? No Do you often feel hopeless? No Do you cry easily over simple problems? No   Activities of Daily Living  In your present state of health, do you have any difficulty performing the following activities?:  Driving? No  Managing money? No  Feeding yourself? No  Getting from bed to chair? No  Climbing a flight of stairs? No  Preparing food and eating?: No  Bathing or showering? No  Getting dressed: No  Getting to the toilet? No  Using the toilet:No  Moving around from place to place: No  In the past year have you fallen or had a near fall?:yes- misjudged height of sidewalk stepping up and tripped   Are  you sexually active? yes Do you have more than one partner? No   Hearing Difficulties: No  Do you often ask people to speak up or repeat themselves? No  Do you experience ringing or noises in your ears? No Do you have difficulty understanding soft or whispered voices? No  Do you feel that you have a problem with memory? yes Do you often misplace items? No  Do you feel safe at home? Yes  Cognitive Testing  Alert? Yes Normal Appearance?Yes  Oriented to person? Yes Place? Yes  Time? Yes  Recall of three objects? Yes  Can perform simple calculations? Yes  Displays appropriate judgment?Yes  Can read the correct time from a watch face?Yes   List the Names of Other Physician/Practitioners you currently use:   Urology- Dr. Suzette Battiest- Orthopedics Dr. Melvyn Novas- Pulmonary   Screening Tests / Date UTD unless stated  Colonoscopy                     Zostavax  Pneumonia- Due for 23 Influenza Vaccine - Due  Tetanus/tdap  ROS: GEN- + fatigue, denies fever, weight loss,weakness, recent illness HEENT- denies eye drainage, change in vision, nasal discharge, CVS- denies chest pain, palpitations RESP- denies SOB, cough, wheeze ABD- denies N/V, change in stools, abd pain GU- denies dysuria, hematuria, dribbling, incontinence MSK- denies joint pain, muscle aches, injury Neuro- denies headache, dizziness, syncope, seizure activity   Physical: GEN- NAD, alert and oriented x3 HEENT- PERRL, EOMI, non injected  sclera, pink conjunctiva, MMM, oropharynx clear Neck- Supple, no thryomegaly, no bruit  CVS- RRR, no murmur RESP-CTAB ABD-NABS,soft,NT,ND  EXT- No edema Pulses- Radial, DP- 2+    Assessment:    Annual wellness medicare exam   Plan:    During the course of the visit the patient was educated and counseled about appropriate screening and preventive services including:    CPE done, Pneumo Vaccine 23 given, flu shot given Fasting labs Vitamin D def history on OTC  supplement recheck level F/u with pulmonary for year PFT and Urology for Prostate cancer surveillance  S/p irrigation at bedside for cerumen impaction  Discussed Hep C screening     Diet review for nutrition referral? Yes ____ Not Indicated __x__  Patient Instructions (the written plan) was given to the patient.  Medicare Attestation  I have personally reviewed:  The patient's medical and social history  Their use of alcohol, tobacco or illicit drugs  Their current medications and supplements  The patient's functional ability including ADLs,fall risks, home safety risks, cognitive, and hearing and visual impairment  Diet and physical activities  Evidence for depression or mood disorders  The patient's weight, height, BMI, and visual acuity have been recorded in the chart. I have made referrals, counseling, and provided education to the patient based on review of the above and I have provided the patient with a written personalized care plan for preventive services.

## 2016-10-16 LAB — VITAMIN D 25 HYDROXY (VIT D DEFICIENCY, FRACTURES): Vit D, 25-Hydroxy: 29 ng/mL — ABNORMAL LOW (ref 30–100)

## 2016-10-16 LAB — HEPATITIS C ANTIBODY: HCV Ab: NEGATIVE

## 2016-10-20 ENCOUNTER — Encounter: Payer: Self-pay | Admitting: Internal Medicine

## 2016-10-20 ENCOUNTER — Ambulatory Visit (HOSPITAL_COMMUNITY): Admission: RE | Admit: 2016-10-20 | Payer: Worker's Compensation | Source: Ambulatory Visit

## 2016-10-20 ENCOUNTER — Ambulatory Visit (INDEPENDENT_AMBULATORY_CARE_PROVIDER_SITE_OTHER): Payer: Worker's Compensation | Admitting: Internal Medicine

## 2016-10-20 ENCOUNTER — Ambulatory Visit (HOSPITAL_COMMUNITY)
Admission: RE | Admit: 2016-10-20 | Discharge: 2016-10-20 | Disposition: A | Discharge status: Home / Self Care | Payer: Medicare Other | Source: Ambulatory Visit | Attending: Internal Medicine | Admitting: Internal Medicine

## 2016-10-20 ENCOUNTER — Other Ambulatory Visit (HOSPITAL_COMMUNITY): Payer: Self-pay | Admitting: Respiratory Therapy

## 2016-10-20 ENCOUNTER — Encounter (HOSPITAL_COMMUNITY): Payer: Self-pay

## 2016-10-20 ENCOUNTER — Ambulatory Visit (INDEPENDENT_AMBULATORY_CARE_PROVIDER_SITE_OTHER)
Admission: RE | Admit: 2016-10-20 | Discharge: 2016-10-20 | Disposition: A | Discharge status: Home / Self Care | Payer: Medicare Other | Source: Ambulatory Visit | Attending: Internal Medicine | Admitting: Internal Medicine

## 2016-10-20 VITALS — BP 124/74 | HR 61 | Ht 74.0 in | Wt 189.0 lb

## 2016-10-20 DIAGNOSIS — Z0000 Encounter for general adult medical examination without abnormal findings: Secondary | ICD-10-CM | POA: Diagnosis not present

## 2016-10-20 DIAGNOSIS — Z77090 Contact with and (suspected) exposure to asbestos: Secondary | ICD-10-CM

## 2016-10-20 DIAGNOSIS — R05 Cough: Secondary | ICD-10-CM

## 2016-10-20 DIAGNOSIS — R058 Other specified cough: Secondary | ICD-10-CM

## 2016-10-20 LAB — PULMONARY FUNCTION TEST
DL/VA % PRED: 121 %
DL/VA: 5.86 ml/min/mmHg/L
DLCO UNC % PRED: 97 %
DLCO cor % pred: 96 %
DLCO cor: 36.54 ml/min/mmHg
DLCO unc: 36.64 ml/min/mmHg
FEF 25-75 PRE: 2.78 L/s
FEF 25-75 Post: 3.41 L/sec
FEF2575-%CHANGE-POST: 22 %
FEF2575-%PRED-POST: 112 %
FEF2575-%Pred-Pre: 91 %
FEV1-%CHANGE-POST: 2 %
FEV1-%Pred-Post: 97 %
FEV1-%Pred-Pre: 95 %
FEV1-PRE: 3.31 L
FEV1-Post: 3.4 L
FEV1FVC-%CHANGE-POST: 7 %
FEV1FVC-%Pred-Pre: 99 %
FEV6-%Change-Post: -2 %
FEV6-%PRED-PRE: 94 %
FEV6-%Pred-Post: 91 %
FEV6-PRE: 4.09 L
FEV6-Post: 3.99 L
FEV6FVC-%Change-Post: 2 %
FEV6FVC-%PRED-PRE: 98 %
FEV6FVC-%Pred-Post: 100 %
FVC-%Change-Post: -4 %
FVC-%PRED-POST: 91 %
FVC-%PRED-PRE: 95 %
FVC-POST: 4.14 L
FVC-PRE: 4.33 L
POST FEV1/FVC RATIO: 82 %
PRE FEV6/FVC RATIO: 94 %
Post FEV6/FVC ratio: 96 %
Pre FEV1/FVC ratio: 76 %
RV % PRED: 74 %
RV: 1.94 L
TLC % pred: 84 %
TLC: 6.6 L

## 2016-10-20 MED ORDER — ALBUTEROL SULFATE (2.5 MG/3ML) 0.083% IN NEBU
2.5000 mg | INHALATION_SOLUTION | Freq: Once | RESPIRATORY_TRACT | Status: AC
  Administered 2016-10-20: 2.5 mg via RESPIRATORY_TRACT

## 2016-10-20 NOTE — Patient Instructions (Signed)
Return in one year for pfts and cxr  - call for earlier follow up for cough or breathing issues   Try clariton or allergra as needed for drainage but try not to clear throat    GERD (REFLUX)  is an extremely common cause of respiratory symptoms just like yours , many times with no obvious heartburn at all.    It can be treated with medication, but also with lifestyle changes including elevation of the head of your bed (ideally with 6 inch  bed blocks),  Smoking cessation, avoidance of late meals, excessive alcohol, and avoid fatty foods, chocolate, peppermint, colas, red wine, and acidic juices such as orange juice.  NO MINT OR MENTHOL PRODUCTS SO NO COUGH DROPS  USE SUGARLESS CANDY INSTEAD (Jolley ranchers or Stover's or Life Savers) or even ice chips will also do - the key is to swallow to prevent all throat clearing. NO OIL BASED VITAMINS - use powdered substitutes.

## 2016-10-22 ENCOUNTER — Encounter: Payer: Self-pay | Admitting: Internal Medicine

## 2016-10-22 NOTE — Progress Notes (Signed)
Subjective:    Patient ID: Luis Stewart, male    DOB: 12-Feb-1951    MRN: [id number]    Brief patient profile:  73 yobm from [place]  never smoker worked for [employer] last exposed 2003 with abn cxr 2014 referred by Dr Buelah Manis to pulmonary clinic 08/28/2015    History of Present Illness  08/28/2015 1st [facility] office visit/ Luis Stewart   Chief Complaint  Patient presents with  . Pulmonary Consult    Referred by Dr. Vic Blackbird. Pt has hx of asbestos exposure. He states that he is overall doing well. He does have some prod cough with clear to yellow sputum.    eval by Dr Beverly Gust with ? increaesed int markings but no f/u ct to date  Am cough x decades/ sensation of pnds / has not tried otc's rec Please see patient coordinator before you leave today  to schedule HRCT chest and return here same day with pfts on return  Try zyrtec 10 mg at bedtime to see if your daily am cough is better if now I recommend For drainage take chlortrimeton (chlorpheniramine) 4 mg every 4 hours available over the counter (may cause drowsiness)    09/27/2015  f/u ov/Luis Stewart re:  Chief Complaint  Patient presents with  . Follow-up    Hx of asbestos exposure. Pt states that hsi breathing is doing well. Pt c/o of occasional wet cough, yellow to clear mucus. Pt denies wheeze/CP/SOB.    Not limited by breathing from desired activities  rec Try zyrtec 10 mg at bedtime to see if your daily am cough is better if not I recommend For drainage take chlortrimeton (chlorpheniramine) 4 mg every 4 hours available over the counter (may cause drowsiness)    10/20/2016  f/u ov/Luis Stewart re:  Chief Complaint  Patient presents with  . Follow-up    PFT's and cxr done today. He c/o soreness in his chest for the past year. He has had some PND and sore throat for the past day.   not using otc's as rec "too sleepy" but actually did work   No obvious day to day or daytime variability or assoc cp or chest tightness,  subjective wheeze or overt   hb symptoms. No unusual exp hx or h/o childhood pna/ asthma or knowledge of premature birth.  Sleeping ok without nocturnal  or early am exacerbation  of respiratory  c/o's or need for noct saba. Also denies any obvious fluctuation of symptoms with weather or environmental changes or other aggravating or alleviating factors except as outlined above   Current Medications, Allergies, Complete Past Medical History, Past Surgical History, Family History, and Social History were reviewed in [employer] record.  ROS  The following are not active complaints unless bolded sore throat, dysphagia, dental problems, itching, sneezing,  nasal congestion or excess/ purulent secretions, ear ache,   fever, chills, sweats, unintended wt loss, classically pleuritic or exertional cp, hemoptysis,  orthopnea pnd or leg swelling, presyncope, palpitations, abdominal pain, anorexia, nausea, vomiting, diarrhea  or change in bowel or bladder habits, change in stools or urine, dysuria,hematuria,  rash, arthralgias, visual complaints, headache, numbness, weakness or ataxia or problems with walking or coordination,  change in mood/affect or memory.           Objective:   Physical Exam  amb pleasant bm nad  09/27/2015       184 >  10/20/2016    189     08/28/15 177  lb 12.8 oz (80.65 kg)  08/17/15 178 lb (80.74 kg)  09/04/14 184 lb (83.462 kg)    Vital signs reviewed  - - Note on arrival 02 sats  97% on RA     HEENT: nl dentition, turbinates, and orophanx. Nl external ear canals without cough reflex   NECK :  without JVD/Nodes/TM/ nl carotid upstrokes bilaterally   LUNGS: no acc muscle use, clear to A and P bilaterally without cough on insp or exp maneuvers   CV:  RRR  no s3 or murmur or increase in P2, no edema   ABD:  soft and nontender with nl excursion in the supine position. No bruits or organomegaly, bowel sounds nl  MS:  warm without deformities, calf  tenderness, cyanosis or clubbing  SKIN: warm and dry without lesions    NEURO:  alert, approp, no deficits     CXR PA and Lateral:   10/20/2016 :    I personally reviewed images and agree with radiology impression as follows:    Cardiac shadow is within normal limits. The lungs are hyperinflated consistent with COPD. No focal infiltrate or sizable effusion is seen. No acute bony abnormality is noted.     Assessment & Plan:

## 2016-10-22 NOTE — Assessment & Plan Note (Signed)
HRCT   09/27/15  1. No evidence of asbestos related pleural disease. 2. No evidence of interstitial lung disease. Specifically, no significant regions of subpleural reticulation, ground-glass attenuation, traction bronchiectasis or frank honeycombing. 3. Mild air trapping, suggestive of nonspecific mild small airway disease. 4. Solitary 4 mm right middle lobe pulmonary nodule with a benign Appearance. - PFT's 09/27/2015 wnl with FVC  4.33  97/96%  > 121 %  Corrected for alv vol    No evidence at all to support asbestosis or any other asbestos lung dz with last exp 45 y ago  Discussed in detail all the  indications, usual  risks and alternatives  relative to the benefits with patient who agrees to proceed with conservative f/u yearly, call sooner if needed  Total time devoted to counseling  = 15/25 m review case with pt/wife discussion of options/alternatives/ personally creating written instructions  in presence of pt  then going over those specific  Instructions directly with the pt including how to use all of the meds but in particular covering each new medication in detail and the difference between the maintenance/automatic meds and the prns using an action plan format for the latter.

## 2016-10-22 NOTE — Assessment & Plan Note (Signed)
impoved with zyrtec but could not tol > try non sedating antihistamines / f/u allergy eval prn

## 2016-11-04 DIAGNOSIS — C61 Malignant neoplasm of prostate: Secondary | ICD-10-CM | POA: Diagnosis not present

## 2016-11-11 ENCOUNTER — Ambulatory Visit (INDEPENDENT_AMBULATORY_CARE_PROVIDER_SITE_OTHER): Payer: Medicare Other | Admitting: Urology

## 2016-11-11 DIAGNOSIS — N400 Enlarged prostate without lower urinary tract symptoms: Secondary | ICD-10-CM

## 2016-11-11 DIAGNOSIS — C61 Malignant neoplasm of prostate: Secondary | ICD-10-CM | POA: Diagnosis not present

## 2017-02-24 DIAGNOSIS — M25561 Pain in right knee: Secondary | ICD-10-CM | POA: Diagnosis not present

## 2017-05-01 DIAGNOSIS — M25561 Pain in right knee: Secondary | ICD-10-CM | POA: Diagnosis not present

## 2017-05-06 ENCOUNTER — Encounter: Payer: Self-pay | Admitting: Family Medicine

## 2017-05-06 ENCOUNTER — Ambulatory Visit (INDEPENDENT_AMBULATORY_CARE_PROVIDER_SITE_OTHER): Payer: Medicare Other | Admitting: Family Medicine

## 2017-05-06 VITALS — BP 122/76 | HR 62 | Temp 98.3°F | Resp 14 | Ht 74.0 in | Wt 181.0 lb

## 2017-05-06 DIAGNOSIS — C61 Malignant neoplasm of prostate: Secondary | ICD-10-CM

## 2017-05-06 DIAGNOSIS — R109 Unspecified abdominal pain: Secondary | ICD-10-CM

## 2017-05-06 DIAGNOSIS — M25552 Pain in left hip: Secondary | ICD-10-CM | POA: Diagnosis not present

## 2017-05-06 DIAGNOSIS — R3129 Other microscopic hematuria: Secondary | ICD-10-CM | POA: Diagnosis not present

## 2017-05-06 LAB — URINALYSIS, MICROSCOPIC ONLY
Bacteria, UA: NONE SEEN [HPF]
CASTS: NONE SEEN [LPF]
Crystals: NONE SEEN [HPF]
WBC UA: NONE SEEN WBC/HPF (ref <=5)
Yeast: NONE SEEN [HPF]

## 2017-05-06 LAB — URINALYSIS, ROUTINE W REFLEX MICROSCOPIC
Bilirubin Urine: NEGATIVE
Glucose, UA: NEGATIVE
Ketones, ur: NEGATIVE
LEUKOCYTES UA: NEGATIVE
NITRITE: NEGATIVE
PH: 5.5 (ref 5.0–8.0)
PROTEIN: NEGATIVE
Specific Gravity, Urine: 1.015 (ref 1.001–1.035)

## 2017-05-06 NOTE — Progress Notes (Signed)
   Subjective:    Patient ID: Luis Stewart, male    DOB: 1951-01-26, 66 y.o.   MRN: [id number]  Patient presents for L Sided Pain (x3 days- hip pain/ lower back pain on L side- states that pain was worse, but has improved some today)  Left flank pain for past 3 days, denies change in bowel or bladder. Feels pain into his lower back and hip a ttimes as well. Has known OA knee, to have knee replacement In June, Knee flared up this weekend, that improved and then back area started hurting.  Denies any tingling numbness in extremeties Took tylenol arthritis and ASA which helped his knee He does have prostate cancer which is on surveillance      Review Of Systems:  GEN- denies fatigue, fever, weight loss,weakness, recent illness HEENT- denies eye drainage, change in vision, nasal discharge, CVS- denies chest pain, palpitations RESP- denies SOB, cough, wheeze ABD- denies N/V, change in stools, abd pain GU- denies dysuria, hematuria, dribbling, incontinence MSK- + joint pain, muscle aches, injury Neuro- denies headache, dizziness, syncope, seizure activity       Objective:    BP 122/76   Pulse 62   Temp 98.3 F (36.8 C) (Oral)   Resp 14   Ht 6\' 2"  (1.88 m)   Wt 181 lb (82.1 kg)   SpO2 98%   BMI 23.24 kg/m  GEN- NAD, alert and oriented x3 HEENT- PERRL, EOMI, non injected sclera, pink conjunctiva, MMM, oropharynx clear Neck- Supple, no thyromegaly CVS- RRR, no murmur RESP-CTAB ABD-NABS,soft, TTP LLQ, mild TTP left flank MSK- Spine mild TTP, neg SLR, fair ROM spine, HIPS,                    EXT- No edema Pulses- Radial, DP- 2+        Assessment & Plan:      Problem List Items Addressed This Visit    Prostate cancer ([place])    Other Visit Diagnoses    Left flank pain    -  Primary   Possible kidney stone with intermittnat pain blood in urine, this may also be conidence and he pain may be more from back with radiation. with his prostate cancer, no signs of  symptoms of infection given As pain not severe he can take tylenol state this works best, obtain imaging    Relevant Orders   Urinalysis, Routine w reflex microscopic (Completed)   DG Abd 1 View   Left hip pain       obtain xray lumbar spine and hip f/u boney pathology and sign of any mets with prostate cancer   Relevant Orders   DG Lumbar Spine Complete   DG HIP UNILAT W OR W/O PELVIS 2-3 VIEWS LEFT   Microscopic hematuria       Relevant Orders   DG Abd 1 View      Note: This dictation was prepared with Dragon dictation along with smaller phrase technology. Any transcriptional errors that result from this process are unintentional.

## 2017-05-06 NOTE — Patient Instructions (Signed)
We will call with xray  results  Okay to tylenol  F/U pending results

## 2017-05-07 ENCOUNTER — Encounter: Payer: Self-pay | Admitting: Family Medicine

## 2017-05-07 ENCOUNTER — Ambulatory Visit (HOSPITAL_COMMUNITY)
Admission: RE | Admit: 2017-05-07 | Discharge: 2017-05-07 | Disposition: A | Discharge status: Home / Self Care | Payer: Medicare Other | Source: Ambulatory Visit | Attending: Family Medicine | Admitting: Family Medicine

## 2017-05-07 DIAGNOSIS — R3129 Other microscopic hematuria: Secondary | ICD-10-CM | POA: Insufficient documentation

## 2017-05-07 DIAGNOSIS — M8889 Osteitis deformans of multiple sites: Secondary | ICD-10-CM | POA: Diagnosis not present

## 2017-05-07 DIAGNOSIS — R109 Unspecified abdominal pain: Secondary | ICD-10-CM | POA: Insufficient documentation

## 2017-05-07 DIAGNOSIS — M25552 Pain in left hip: Secondary | ICD-10-CM | POA: Diagnosis not present

## 2017-05-07 DIAGNOSIS — R937 Abnormal findings on diagnostic imaging of other parts of musculoskeletal system: Secondary | ICD-10-CM | POA: Insufficient documentation

## 2017-05-07 DIAGNOSIS — R93422 Abnormal radiologic findings on diagnostic imaging of left kidney: Secondary | ICD-10-CM | POA: Insufficient documentation

## 2017-05-07 DIAGNOSIS — M47816 Spondylosis without myelopathy or radiculopathy, lumbar region: Secondary | ICD-10-CM | POA: Diagnosis not present

## 2017-05-08 ENCOUNTER — Other Ambulatory Visit: Payer: Self-pay | Admitting: *Deleted

## 2017-05-08 DIAGNOSIS — N2889 Other specified disorders of kidney and ureter: Secondary | ICD-10-CM

## 2017-05-08 DIAGNOSIS — R109 Unspecified abdominal pain: Secondary | ICD-10-CM

## 2017-05-14 ENCOUNTER — Ambulatory Visit (HOSPITAL_COMMUNITY)
Admission: RE | Admit: 2017-05-14 | Discharge: 2017-05-14 | Disposition: A | Discharge status: Home / Self Care | Payer: Medicare Other | Source: Ambulatory Visit | Attending: Family Medicine | Admitting: Family Medicine

## 2017-05-14 DIAGNOSIS — N400 Enlarged prostate without lower urinary tract symptoms: Secondary | ICD-10-CM | POA: Insufficient documentation

## 2017-05-14 DIAGNOSIS — K7689 Other specified diseases of liver: Secondary | ICD-10-CM | POA: Diagnosis not present

## 2017-05-14 DIAGNOSIS — N2889 Other specified disorders of kidney and ureter: Secondary | ICD-10-CM | POA: Insufficient documentation

## 2017-05-14 DIAGNOSIS — R109 Unspecified abdominal pain: Secondary | ICD-10-CM | POA: Insufficient documentation

## 2017-05-14 DIAGNOSIS — I700 Atherosclerosis of aorta: Secondary | ICD-10-CM | POA: Insufficient documentation

## 2017-05-14 DIAGNOSIS — Z8546 Personal history of malignant neoplasm of prostate: Secondary | ICD-10-CM | POA: Insufficient documentation

## 2017-05-15 ENCOUNTER — Telehealth: Payer: Self-pay | Admitting: Family Medicine

## 2017-05-15 DIAGNOSIS — C61 Malignant neoplasm of prostate: Secondary | ICD-10-CM | POA: Diagnosis not present

## 2017-05-15 NOTE — Telephone Encounter (Signed)
Please see imaging report for further documentation.

## 2017-05-15 NOTE — Telephone Encounter (Signed)
CT RESULTS

## 2017-05-19 ENCOUNTER — Ambulatory Visit (INDEPENDENT_AMBULATORY_CARE_PROVIDER_SITE_OTHER): Payer: Medicare Other | Admitting: Urology

## 2017-05-19 DIAGNOSIS — C61 Malignant neoplasm of prostate: Secondary | ICD-10-CM

## 2017-05-19 DIAGNOSIS — D49511 Neoplasm of unspecified behavior of right kidney: Secondary | ICD-10-CM | POA: Diagnosis not present

## 2017-05-19 DIAGNOSIS — N400 Enlarged prostate without lower urinary tract symptoms: Secondary | ICD-10-CM | POA: Diagnosis not present

## 2017-05-20 ENCOUNTER — Other Ambulatory Visit: Payer: Self-pay | Admitting: Urology

## 2017-05-20 DIAGNOSIS — N281 Cyst of kidney, acquired: Secondary | ICD-10-CM

## 2017-05-22 DIAGNOSIS — M25561 Pain in right knee: Secondary | ICD-10-CM | POA: Diagnosis not present

## 2017-05-22 NOTE — H&P (Signed)
TOTAL KNEE ADMISSION H&P  Patient is being admitted for right total knee arthroplasty.  Subjective:  Chief Complaint:right knee pain.  HPI: Luis Stewart, 66 y.o. male, has a history of pain and functional disability in the right knee due to arthritis and has failed non-surgical conservative treatments for greater than 12 weeks to includeNSAID's and/or analgesics and corticosteriod injections.  Onset of symptoms was gradual, starting 4 years ago with gradually worsening course since that time. The patient noted no past surgery on the right knee(s).  Patient currently rates pain in the right knee(s) at 6 out of 10 with activity. Patient has night pain, worsening of pain with activity and weight bearing, pain that interferes with activities of daily living, crepitus and joint swelling.  Patient has evidence of subchondral sclerosis and joint space narrowing by imaging studies. There is no active infection.  Patient Active Problem List   Diagnosis Date Noted  . Upper airway cough syndrome 08/29/2015  . Prostate cancer ([place]) 08/17/2015  . Exposure to asbestos 08/17/2015  . Personal history of colonic polyps 08/08/2014  . Joint pain 06/05/2014  . Vitamin D deficiency 06/05/2014  . BPH (benign prostatic hypertrophy) 06/01/2013  . Mild hyperlipidemia 06/01/2013  . Routine general medical examination at a health care facility 06/01/2013   Past Medical History:  Diagnosis Date  . Asbestosis ([place])   . History of fracture of leg    Right, childhood  . Hx of migraine headaches   . Hyperlipidemia   . Neuropathy   . 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 BIOPSY PROSTATE  2003 and 2005  . COLONOSCOPY  11/28/2008   Dr. Rourk:internal hemorrhoids/tortus colon/ascending colon polyps, tubular adenomas  . COLONOSCOPY N/A 09/04/2014   Procedure: COLONOSCOPY;  Surgeon: Daneil Dolin, MD;  Location: [facility];  Service:  Endoscopy;  Laterality: N/A;  9:30    No prescriptions prior to admission.   No Known Allergies  Social History  Substance Use Topics  . Smoking status: Never Smoker  . Smokeless tobacco: Never Used  . Alcohol use No    Family History  Problem Relation Age of Onset  . Diabetes Mother   . Hypertension Mother   . Obesity Mother   . Heart disease Mother   . Mental illness Mother   . Hypertension Sister   . Obesity Sister   . Arthritis Sister   . Deep vein thrombosis Father   . Dementia Father   . Heart disease Father        CABG  . Colon cancer Neg Hx      Review of Systems  Constitutional: Negative.   HENT: Negative.   Eyes: Negative.   Respiratory: Negative.   Cardiovascular: Negative.   Gastrointestinal: Negative.   Genitourinary: Positive for frequency and urgency.  Musculoskeletal: Positive for joint pain.  Skin: Negative.   Neurological: Negative.   Endo/Heme/Allergies: Negative.   Psychiatric/Behavioral: The patient is nervous/anxious.     Objective:  Physical Exam  Constitutional: He is oriented to person, place, and time. He appears well-developed and well-nourished.  HENT:  Head: Normocephalic and atraumatic.  Eyes: EOM are normal. Pupils are equal, round, and reactive to light.  Neck: Normal range of motion. Neck supple.  Cardiovascular: Normal rate.   Murmur heard. Respiratory: Effort normal and breath sounds normal.  GI: Soft. Bowel sounds are normal.  Musculoskeletal:  Examination of his right knee revealed a  trace effusion, range of motion 0-120 degrees, minimal patellofemoral crepitus, lateral joint line tenderness. Ligaments are stable. He is neurovascularly intact distally.  Neurological: He is alert and oriented to person, place, and time.  Skin: Skin is warm and dry.  Psychiatric: He has a normal mood and affect. His behavior is normal. Judgment and thought content normal.    Vital signs in last 24 hours:    Labs:   Estimated body  mass index is 23.24 kg/m as calculated from the following:   Height as of 05/06/17: 6\' 2"  (1.88 m).   Weight as of 05/06/17: 82.1 kg (181 lb).   Imaging Review Plain radiographs demonstrate severe degenerative joint disease of the right knee(s). The overall alignment isneutral. The bone quality appears to be fair for age and reported activity level.  Assessment/Plan:  End stage arthritis, right knee   The patient history, physical examination, clinical judgment of the provider and imaging studies are consistent with end stage degenerative joint disease of the right knee(s) and total knee arthroplasty is deemed medically necessary. The treatment options including medical management, injection therapy arthroscopy and arthroplasty were discussed at length. The risks and benefits of total knee arthroplasty were presented and reviewed. The risks due to aseptic loosening, infection, stiffness, patella tracking problems, thromboembolic complications and other imponderables were discussed. The patient acknowledged the explanation, agreed to proceed with the plan and consent was signed. Patient is being admitted for inpatient treatment for surgery, pain control, PT, OT, prophylactic antibiotics, VTE prophylaxis, progressive ambulation and ADL's and discharge planning. The patient is planning to be discharged home with home health services

## 2017-05-27 ENCOUNTER — Ambulatory Visit (HOSPITAL_COMMUNITY)
Admission: RE | Admit: 2017-05-27 | Discharge: 2017-05-27 | Disposition: A | Discharge status: Home / Self Care | Payer: Medicare Other | Source: Ambulatory Visit | Attending: Urology | Admitting: Urology

## 2017-05-27 DIAGNOSIS — N281 Cyst of kidney, acquired: Secondary | ICD-10-CM | POA: Diagnosis not present

## 2017-05-28 NOTE — Pre-Procedure Instructions (Signed)
    Luis Stewart  05/28/2017      [id number] - [place], [place] [address] Phone: [phone] Fax: [phone]  [id number] - [place], [place] [address] AT [facility] [place] [address] Phone: [phone] Fax: [phone]    Your procedure is scheduled on 06/10/17.  Report to [facility] Admitting at 630 A.M.  Call this number if you have problems the morning of surgery:  [phone]   Remember:  Do not eat food or drink liquids after midnight.  Take these medicines the morning of surgery with A SIP OF WATER --tylenol   Do not wear jewelry, make-up or nail polish.  Do not wear lotions, powders, or perfumes, or deoderant.  Do not shave 48 hours prior to surgery.  Men may shave face and neck.  Do not bring valuables to the hospital.  [facility] is not responsible for any belongings or valuables.  Contacts, dentures or bridgework may not be worn into surgery.  Leave your suitcase in the car.  After surgery it may be brought to your room.  For patients admitted to the hospital, discharge time will be determined by your treatment team.  Patients discharged the day of surgery will not be allowed to drive home.   Name and phone number of your driver:    Special instructions: Do not take any aspirin,anti-inflammatories,vitamins,or herbal supplements 5-7 days prior to surgery.  Please read over the following fact sheets that you were given. MRSA Information

## 2017-05-29 ENCOUNTER — Encounter (HOSPITAL_COMMUNITY): Payer: Self-pay

## 2017-05-29 ENCOUNTER — Encounter (HOSPITAL_COMMUNITY)
Admission: RE | Admit: 2017-05-29 | Discharge: 2017-05-29 | Disposition: A | Discharge status: Home / Self Care | Payer: Medicare Other | Source: Ambulatory Visit | Attending: Orthopedic Surgery | Admitting: Orthopedic Surgery

## 2017-05-29 DIAGNOSIS — Z01812 Encounter for preprocedural laboratory examination: Secondary | ICD-10-CM | POA: Insufficient documentation

## 2017-05-29 DIAGNOSIS — Z01810 Encounter for preprocedural cardiovascular examination: Secondary | ICD-10-CM | POA: Diagnosis not present

## 2017-05-29 DIAGNOSIS — R001 Bradycardia, unspecified: Secondary | ICD-10-CM | POA: Diagnosis not present

## 2017-05-29 DIAGNOSIS — M1711 Unilateral primary osteoarthritis, right knee: Secondary | ICD-10-CM | POA: Diagnosis not present

## 2017-05-29 HISTORY — DX: Headache: R51

## 2017-05-29 HISTORY — DX: Headache, unspecified: R51.9

## 2017-05-29 LAB — BASIC METABOLIC PANEL
Anion gap: 8 (ref 5–15)
BUN: 13 mg/dL (ref 6–20)
CO2: 28 mmol/L (ref 22–32)
Calcium: 9.2 mg/dL (ref 8.9–10.3)
Chloride: 105 mmol/L (ref 101–111)
Creatinine, Ser: 1.18 mg/dL (ref 0.61–1.24)
GFR calc Af Amer: >60 mL/min (ref >60)
Glucose, Bld: 98 mg/dL (ref 65–99)
POTASSIUM: 4.2 mmol/L (ref 3.5–5.1)
Sodium: 141 mmol/L (ref 135–145)

## 2017-05-29 LAB — URINALYSIS, ROUTINE W REFLEX MICROSCOPIC
Bilirubin Urine: NEGATIVE
GLUCOSE, UA: NEGATIVE mg/dL
Hgb urine dipstick: NEGATIVE
Ketones, ur: NEGATIVE mg/dL
LEUKOCYTES UA: NEGATIVE
Nitrite: NEGATIVE
PH: 5 (ref 5.0–8.0)
PROTEIN: NEGATIVE mg/dL
SPECIFIC GRAVITY, URINE: 1.015 (ref 1.005–1.030)

## 2017-05-29 LAB — SURGICAL PCR SCREEN
MRSA, PCR: NEGATIVE
STAPHYLOCOCCUS AUREUS: NEGATIVE

## 2017-05-29 LAB — CBC
HEMATOCRIT: 42.3 % (ref 39.0–52.0)
Hemoglobin: 14.3 g/dL (ref 13.0–17.0)
MCH: 30.2 pg (ref 26.0–34.0)
MCHC: 33.8 g/dL (ref 30.0–36.0)
MCV: 89.2 fL (ref 78.0–100.0)
PLATELETS: 140 10*3/uL — AB (ref 150–400)
RBC: 4.74 MIL/uL (ref 4.22–5.81)
RDW: 13.9 % (ref 11.5–15.5)
WBC: 2.8 10*3/uL — ABNORMAL LOW (ref 4.0–10.5)

## 2017-05-29 LAB — TYPE AND SCREEN
ABO/RH(D): A POS
Antibody Screen: NEGATIVE

## 2017-05-29 LAB — ABO/RH: ABO/RH(D): A POS

## 2017-05-30 LAB — URINE CULTURE: Culture: NO GROWTH

## 2017-06-08 ENCOUNTER — Other Ambulatory Visit: Payer: Self-pay | Admitting: Orthopedic Surgery

## 2017-06-09 MED ORDER — CEFAZOLIN SODIUM-DEXTROSE 2-4 GM/100ML-% IV SOLN
2.0000 g | INTRAVENOUS | Status: AC
  Administered 2017-06-10: 2 g via INTRAVENOUS
  Filled 2017-06-09: qty 100

## 2017-06-09 MED ORDER — TRANEXAMIC ACID 1000 MG/10ML IV SOLN
1000.0000 mg | INTRAVENOUS | Status: DC
  Filled 2017-06-09: qty 10

## 2017-06-10 ENCOUNTER — Encounter (HOSPITAL_COMMUNITY)
Admission: RE | Disposition: A | Discharge status: Home Health | Payer: Self-pay | Source: Ambulatory Visit | Attending: Orthopedic Surgery

## 2017-06-10 ENCOUNTER — Inpatient Hospital Stay (HOSPITAL_COMMUNITY): Payer: Medicare Other | Admitting: Certified Registered"

## 2017-06-10 ENCOUNTER — Inpatient Hospital Stay (HOSPITAL_COMMUNITY): Payer: Medicare Other

## 2017-06-10 ENCOUNTER — Inpatient Hospital Stay (HOSPITAL_COMMUNITY)
Admission: RE | Admit: 2017-06-10 | Discharge: 2017-06-12 | DRG: 470 | Disposition: A | Discharge status: Home Health | Payer: Medicare Other | Source: Ambulatory Visit | Attending: Orthopedic Surgery | Admitting: Orthopedic Surgery

## 2017-06-10 ENCOUNTER — Encounter (HOSPITAL_COMMUNITY): Payer: Self-pay | Admitting: Urology

## 2017-06-10 DIAGNOSIS — Z96659 Presence of unspecified artificial knee joint: Secondary | ICD-10-CM

## 2017-06-10 DIAGNOSIS — C61 Malignant neoplasm of prostate: Secondary | ICD-10-CM | POA: Diagnosis present

## 2017-06-10 DIAGNOSIS — Z77090 Contact with and (suspected) exposure to asbestos: Secondary | ICD-10-CM | POA: Diagnosis present

## 2017-06-10 DIAGNOSIS — G8918 Other acute postprocedural pain: Secondary | ICD-10-CM | POA: Diagnosis not present

## 2017-06-10 DIAGNOSIS — E785 Hyperlipidemia, unspecified: Secondary | ICD-10-CM | POA: Diagnosis present

## 2017-06-10 DIAGNOSIS — Z8546 Personal history of malignant neoplasm of prostate: Secondary | ICD-10-CM | POA: Diagnosis not present

## 2017-06-10 DIAGNOSIS — M25561 Pain in right knee: Secondary | ICD-10-CM | POA: Diagnosis not present

## 2017-06-10 DIAGNOSIS — M1711 Unilateral primary osteoarthritis, right knee: Secondary | ICD-10-CM | POA: Diagnosis not present

## 2017-06-10 DIAGNOSIS — D62 Acute posthemorrhagic anemia: Secondary | ICD-10-CM | POA: Diagnosis not present

## 2017-06-10 HISTORY — PX: TOTAL KNEE ARTHROPLASTY: SHX125

## 2017-06-10 HISTORY — DX: Cyst of kidney, acquired: N28.1

## 2017-06-10 HISTORY — DX: Unspecified osteoarthritis, unspecified site: M19.90

## 2017-06-10 SURGERY — ARTHROPLASTY, KNEE, TOTAL
Anesthesia: Spinal | Site: Knee | Laterality: Right

## 2017-06-10 MED ORDER — BISACODYL 5 MG PO TBEC: 5.0000 mg | DELAYED_RELEASE_TABLET | Freq: Every day | ORAL | Status: DC | PRN

## 2017-06-10 MED ORDER — PROPOFOL 10 MG/ML IV BOLUS
INTRAVENOUS | Status: DC | PRN
  Administered 2017-06-10: 30 mg via INTRAVENOUS

## 2017-06-10 MED ORDER — ASPIRIN EC 325 MG PO TBEC: 325.0000 mg | DELAYED_RELEASE_TABLET | Freq: Every day | ORAL | 0 refills | Status: DC

## 2017-06-10 MED ORDER — OXYCODONE HCL 5 MG PO TABS: 5.0000 mg | ORAL_TABLET | Freq: Once | ORAL | Status: DC | PRN

## 2017-06-10 MED ORDER — OXYCODONE HCL 5 MG PO TABS
5.0000 mg | ORAL_TABLET | ORAL | Status: DC | PRN
  Administered 2017-06-10 – 2017-06-11 (×4): 10 mg via ORAL
  Filled 2017-06-10 (×4): qty 2

## 2017-06-10 MED ORDER — ACETAMINOPHEN 325 MG PO TABS
650.0000 mg | ORAL_TABLET | Freq: Four times a day (QID) | ORAL | Status: DC | PRN
  Administered 2017-06-10: 650 mg via ORAL
  Filled 2017-06-10 (×2): qty 2

## 2017-06-10 MED ORDER — SODIUM CHLORIDE 0.9 % IR SOLN
Status: DC | PRN
  Administered 2017-06-10: 3000 mL

## 2017-06-10 MED ORDER — ONDANSETRON HCL 4 MG PO TABS: 4.0000 mg | ORAL_TABLET | Freq: Four times a day (QID) | ORAL | Status: DC | PRN

## 2017-06-10 MED ORDER — ACETAMINOPHEN 650 MG RE SUPP: 650.0000 mg | Freq: Four times a day (QID) | RECTAL | Status: DC | PRN

## 2017-06-10 MED ORDER — PROPOFOL 500 MG/50ML IV EMUL
INTRAVENOUS | Status: DC | PRN
  Administered 2017-06-10: 100 ug/kg/min via INTRAVENOUS

## 2017-06-10 MED ORDER — METHOCARBAMOL 1000 MG/10ML IJ SOLN: 500.0000 mg | Freq: Four times a day (QID) | INTRAVENOUS | Status: DC | PRN

## 2017-06-10 MED ORDER — PHENYLEPHRINE HCL 10 MG/ML IJ SOLN
INTRAVENOUS | Status: DC | PRN
  Administered 2017-06-10: 20 ug/min via INTRAVENOUS

## 2017-06-10 MED ORDER — CHLORHEXIDINE GLUCONATE 4 % EX LIQD: 60.0000 mL | Freq: Once | CUTANEOUS | Status: DC

## 2017-06-10 MED ORDER — POLYETHYLENE GLYCOL 3350 17 G PO PACK
17.0000 g | PACK | Freq: Every day | ORAL | Status: DC | PRN
  Administered 2017-06-11: 17 g via ORAL
  Filled 2017-06-10: qty 1

## 2017-06-10 MED ORDER — ALUM & MAG HYDROXIDE-SIMETH 200-200-20 MG/5ML PO SUSP
30.0000 mL | ORAL | Status: DC | PRN
Start: 2017-06-10 — End: 2017-06-12

## 2017-06-10 MED ORDER — ONDANSETRON HCL 4 MG/2ML IJ SOLN
INTRAMUSCULAR | Status: DC | PRN
  Administered 2017-06-10: 4 mg via INTRAVENOUS

## 2017-06-10 MED ORDER — METHOCARBAMOL 500 MG PO TABS
500.0000 mg | ORAL_TABLET | Freq: Four times a day (QID) | ORAL | Status: DC | PRN
  Administered 2017-06-11 (×2): 500 mg via ORAL
  Filled 2017-06-10 (×2): qty 1

## 2017-06-10 MED ORDER — CEFAZOLIN SODIUM-DEXTROSE 2-4 GM/100ML-% IV SOLN
2.0000 g | Freq: Four times a day (QID) | INTRAVENOUS | Status: AC
Start: 2017-06-10 — End: 2017-06-10
  Administered 2017-06-10 (×2): 2 g via INTRAVENOUS
  Filled 2017-06-10 (×2): qty 100

## 2017-06-10 MED ORDER — OXYCODONE HCL 5 MG/5ML PO SOLN: 5.0000 mg | Freq: Once | ORAL | Status: DC | PRN

## 2017-06-10 MED ORDER — ZOLPIDEM TARTRATE 5 MG PO TABS: 5.0000 mg | ORAL_TABLET | Freq: Every evening | ORAL | Status: DC | PRN

## 2017-06-10 MED ORDER — LACTATED RINGERS IV SOLN
INTRAVENOUS | Status: DC
  Administered 2017-06-10 (×2): via INTRAVENOUS

## 2017-06-10 MED ORDER — BUPIVACAINE LIPOSOME 1.3 % IJ SUSP
20.0000 mL | Freq: Once | INTRAMUSCULAR | Status: AC
  Administered 2017-06-10: 20 mL
  Filled 2017-06-10: qty 20

## 2017-06-10 MED ORDER — FENTANYL CITRATE (PF) 250 MCG/5ML IJ SOLN
INTRAMUSCULAR | Status: DC | PRN
  Administered 2017-06-10 (×2): 50 ug via INTRAVENOUS

## 2017-06-10 MED ORDER — MIDAZOLAM HCL 5 MG/5ML IJ SOLN
INTRAMUSCULAR | Status: DC | PRN
  Administered 2017-06-10 (×2): 2 mg via INTRAVENOUS

## 2017-06-10 MED ORDER — OXYCODONE-ACETAMINOPHEN 5-325 MG PO TABS: 1.0000 | ORAL_TABLET | ORAL | 0 refills | Status: DC | PRN

## 2017-06-10 MED ORDER — FENTANYL CITRATE (PF) 250 MCG/5ML IJ SOLN
INTRAMUSCULAR | Status: AC
  Filled 2017-06-10: qty 5

## 2017-06-10 MED ORDER — MENTHOL 3 MG MT LOZG: 1.0000 | LOZENGE | OROMUCOSAL | Status: DC | PRN

## 2017-06-10 MED ORDER — MAGNESIUM CITRATE PO SOLN
1.0000 | Freq: Once | ORAL | Status: AC | PRN
  Administered 2017-06-12: 1 via ORAL
  Filled 2017-06-10: qty 296

## 2017-06-10 MED ORDER — ONDANSETRON HCL 4 MG/2ML IJ SOLN: 4.0000 mg | Freq: Four times a day (QID) | INTRAMUSCULAR | Status: DC | PRN

## 2017-06-10 MED ORDER — HYDROMORPHONE HCL 1 MG/ML IJ SOLN: 0.2500 mg | INTRAMUSCULAR | Status: DC | PRN

## 2017-06-10 MED ORDER — METOCLOPRAMIDE HCL 5 MG/ML IJ SOLN: 5.0000 mg | Freq: Three times a day (TID) | INTRAMUSCULAR | Status: DC | PRN

## 2017-06-10 MED ORDER — PROPOFOL 1000 MG/100ML IV EMUL
INTRAVENOUS | Status: AC
  Filled 2017-06-10: qty 300

## 2017-06-10 MED ORDER — PROPOFOL 10 MG/ML IV BOLUS
INTRAVENOUS | Status: AC
  Filled 2017-06-10: qty 20

## 2017-06-10 MED ORDER — ASPIRIN EC 325 MG PO TBEC
325.0000 mg | DELAYED_RELEASE_TABLET | Freq: Every day | ORAL | Status: DC
  Administered 2017-06-11 – 2017-06-12 (×2): 325 mg via ORAL
  Filled 2017-06-10 (×2): qty 1

## 2017-06-10 MED ORDER — DEXAMETHASONE SODIUM PHOSPHATE 10 MG/ML IJ SOLN
10.0000 mg | Freq: Once | INTRAMUSCULAR | Status: AC
  Administered 2017-06-11: 10 mg via INTRAVENOUS
  Filled 2017-06-10: qty 1

## 2017-06-10 MED ORDER — DIPHENHYDRAMINE HCL 12.5 MG/5ML PO ELIX: 12.5000 mg | ORAL_SOLUTION | ORAL | Status: DC | PRN

## 2017-06-10 MED ORDER — HYDROMORPHONE HCL 1 MG/ML IJ SOLN: 0.5000 mg | INTRAMUSCULAR | Status: DC | PRN

## 2017-06-10 MED ORDER — PROMETHAZINE HCL 25 MG/ML IJ SOLN: 6.2500 mg | INTRAMUSCULAR | Status: DC | PRN

## 2017-06-10 MED ORDER — BUPIVACAINE HCL (PF) 0.5 % IJ SOLN
INTRAMUSCULAR | Status: DC | PRN
  Administered 2017-06-10: 3 mL via INTRATHECAL

## 2017-06-10 MED ORDER — SODIUM CHLORIDE 0.9 % IJ SOLN
INTRAMUSCULAR | Status: DC | PRN
  Administered 2017-06-10: 40 mL

## 2017-06-10 MED ORDER — ONDANSETRON HCL 4 MG PO TABS: 4.0000 mg | ORAL_TABLET | Freq: Three times a day (TID) | ORAL | 0 refills | Status: DC | PRN

## 2017-06-10 MED ORDER — ROPIVACAINE HCL 5 MG/ML IJ SOLN
INTRAMUSCULAR | Status: DC | PRN
  Administered 2017-06-10: 20 mL via PERINEURAL

## 2017-06-10 MED ORDER — MIDAZOLAM HCL 2 MG/2ML IJ SOLN
INTRAMUSCULAR | Status: AC
  Filled 2017-06-10: qty 2

## 2017-06-10 MED ORDER — ONDANSETRON HCL 4 MG/2ML IJ SOLN
INTRAMUSCULAR | Status: AC
  Filled 2017-06-10: qty 2

## 2017-06-10 MED ORDER — PHENOL 1.4 % MT LIQD: 1.0000 | OROMUCOSAL | Status: DC | PRN

## 2017-06-10 MED ORDER — POTASSIUM CHLORIDE IN NACL 20-0.9 MEQ/L-% IV SOLN
INTRAVENOUS | Status: DC
  Administered 2017-06-10 – 2017-06-11 (×2): via INTRAVENOUS
  Filled 2017-06-10 (×2): qty 1000

## 2017-06-10 MED ORDER — DOCUSATE SODIUM 100 MG PO CAPS
100.0000 mg | ORAL_CAPSULE | Freq: Two times a day (BID) | ORAL | Status: DC
  Administered 2017-06-10 – 2017-06-12 (×3): 100 mg via ORAL
  Filled 2017-06-10 (×3): qty 1

## 2017-06-10 MED ORDER — METOCLOPRAMIDE HCL 5 MG PO TABS: 5.0000 mg | ORAL_TABLET | Freq: Three times a day (TID) | ORAL | Status: DC | PRN

## 2017-06-10 SURGICAL SUPPLY — 65 items
APL SKNCLS STERI-STRIP NONHPOA (GAUZE/BANDAGES/DRESSINGS)
BANDAGE ACE 4X5 VEL STRL LF (GAUZE/BANDAGES/DRESSINGS) ×2 IMPLANT
BANDAGE ACE 6X5 VEL STRL LF (GAUZE/BANDAGES/DRESSINGS) ×2 IMPLANT
BANDAGE ESMARK 6X9 LF (GAUZE/BANDAGES/DRESSINGS) ×1 IMPLANT
BENZOIN TINCTURE PRP APPL 2/3 (GAUZE/BANDAGES/DRESSINGS) ×1 IMPLANT
BLADE SAG 18X100X1.27 (BLADE) ×4 IMPLANT
BNDG CMPR 9X6 STRL LF SNTH (GAUZE/BANDAGES/DRESSINGS) ×1
BNDG ESMARK 6X9 LF (GAUZE/BANDAGES/DRESSINGS) ×2
BOWL SMART MIX CTS (DISPOSABLE) ×1 IMPLANT
CAPT KNEE TRIATH TK-4 ×1 IMPLANT
CLSR STERI-STRIP ANTIMIC 1/2X4 (GAUZE/BANDAGES/DRESSINGS) ×1 IMPLANT
COVER SURGICAL LIGHT HANDLE (MISCELLANEOUS) ×2 IMPLANT
CUFF TOURNIQUET SINGLE 34IN LL (TOURNIQUET CUFF) ×2 IMPLANT
DRAPE EXTREMITY T 121X128X90 (DRAPE) ×2 IMPLANT
DRAPE HALF SHEET 40X57 (DRAPES) ×2 IMPLANT
DRAPE IMP U-DRAPE 54X76 (DRAPES) ×1 IMPLANT
DRAPE U-SHAPE 47X51 STRL (DRAPES) ×2 IMPLANT
DRSG AQUACEL AG ADV 3.5X10 (GAUZE/BANDAGES/DRESSINGS) ×2 IMPLANT
DURAPREP 26ML APPLICATOR (WOUND CARE) ×3 IMPLANT
ELECT CAUTERY BLADE 6.4 (BLADE) ×2 IMPLANT
ELECT REM PT RETURN 9FT ADLT (ELECTROSURGICAL) ×2
ELECTRODE REM PT RTRN 9FT ADLT (ELECTROSURGICAL) ×1 IMPLANT
EVACUATOR 1/8 PVC DRAIN (DRAIN) IMPLANT
FACESHIELD WRAPAROUND (MASK) ×4 IMPLANT
FACESHIELD WRAPAROUND OR TEAM (MASK) ×2 IMPLANT
GLOVE BIOGEL PI IND STRL 7.0 (GLOVE) ×1 IMPLANT
GLOVE BIOGEL PI INDICATOR 7.0 (GLOVE) ×1
GLOVE ORTHO TXT STRL SZ7.5 (GLOVE) ×2 IMPLANT
GLOVE SURG ORTHO 7.0 STRL STRW (GLOVE) ×2 IMPLANT
GOWN STRL REUS W/ TWL LRG LVL3 (GOWN DISPOSABLE) ×2 IMPLANT
GOWN STRL REUS W/ TWL XL LVL3 (GOWN DISPOSABLE) ×1 IMPLANT
GOWN STRL REUS W/TWL LRG LVL3 (GOWN DISPOSABLE) ×4
GOWN STRL REUS W/TWL XL LVL3 (GOWN DISPOSABLE) ×2
HANDPIECE INTERPULSE COAX TIP (DISPOSABLE) ×2
IMMOBILIZER KNEE 22 UNIV (SOFTGOODS) ×1 IMPLANT
IMMOBILIZER KNEE 24 THIGH 36 (MISCELLANEOUS) IMPLANT
IMMOBILIZER KNEE 24 UNIV (MISCELLANEOUS)
KIT BASIN OR (CUSTOM PROCEDURE TRAY) ×2 IMPLANT
KIT ROOM TURNOVER OR (KITS) ×2 IMPLANT
MANIFOLD NEPTUNE II (INSTRUMENTS) ×2 IMPLANT
NDL 18GX1X1/2 (RX/OR ONLY) (NEEDLE) ×1 IMPLANT
NDL HYPO 25GX1X1/2 BEV (NEEDLE) ×1 IMPLANT
NEEDLE 18GX1X1/2 (RX/OR ONLY) (NEEDLE) IMPLANT
NEEDLE HYPO 25GX1X1/2 BEV (NEEDLE) ×2 IMPLANT
NS IRRIG 1000ML POUR BTL (IV SOLUTION) ×2 IMPLANT
PACK TOTAL JOINT (CUSTOM PROCEDURE TRAY) ×2 IMPLANT
PACK UNIVERSAL I (CUSTOM PROCEDURE TRAY) ×1 IMPLANT
PAD ARMBOARD 7.5X6 YLW CONV (MISCELLANEOUS) ×2 IMPLANT
SET HNDPC FAN SPRY TIP SCT (DISPOSABLE) ×1 IMPLANT
STRIP CLOSURE SKIN 1/2X4 (GAUZE/BANDAGES/DRESSINGS) ×2 IMPLANT
SUCTION FRAZIER HANDLE 10FR (MISCELLANEOUS) ×1
SUCTION TUBE FRAZIER 10FR DISP (MISCELLANEOUS) ×1 IMPLANT
SUT MNCRL AB 4-0 PS2 18 (SUTURE) ×2 IMPLANT
SUT VIC AB 0 CT1 27 (SUTURE) ×2
SUT VIC AB 0 CT1 27XBRD ANBCTR (SUTURE) IMPLANT
SUT VIC AB 1 CTX 36 (SUTURE) ×4
SUT VIC AB 1 CTX36XBRD ANBCTR (SUTURE) ×1 IMPLANT
SUT VIC AB 2-0 CT1 27 (SUTURE) ×4
SUT VIC AB 2-0 CT1 TAPERPNT 27 (SUTURE) ×2 IMPLANT
SYR 50ML LL SCALE MARK (SYRINGE) ×2 IMPLANT
SYR CONTROL 10ML LL (SYRINGE) ×1 IMPLANT
TOWEL OR 17X24 6PK STRL BLUE (TOWEL DISPOSABLE) ×1 IMPLANT
TOWEL OR 17X26 10 PK STRL BLUE (TOWEL DISPOSABLE) ×2 IMPLANT
TRAY CATH 16FR W/PLASTIC CATH (SET/KITS/TRAYS/PACK) IMPLANT
WATER STERILE IRR 1000ML POUR (IV SOLUTION) ×2 IMPLANT

## 2017-06-10 NOTE — Progress Notes (Signed)
Orthopedic Tech Progress Note Patient Details:  Luis Stewart Aug 10, 1951 [id number]  CPM Right Knee CPM Right Knee: On Right Knee Flexion (Degrees): 90 Right Knee Extension (Degrees): 0 Additional Comments: trapeze bar patient helper   Hildred Priest 06/10/2017, 11:18 AM Viewed order from doctor's order list

## 2017-06-10 NOTE — Anesthesia Procedure Notes (Signed)
Spinal  Patient location during procedure: OR Start time: 06/10/2017 7:58 AM End time: 06/10/2017 8:03 AM Staffing Anesthesiologist: Candida Peeling RAY Performed: anesthesiologist  Preanesthetic Checklist Completed: patient identified, site marked, surgical consent, pre-op evaluation, timeout performed, IV checked, risks and benefits discussed and monitors and equipment checked Spinal Block Patient position: sitting Prep: DuraPrep Patient monitoring: heart rate, cardiac monitor, continuous pulse ox and blood pressure Approach: midline Location: L3-4 Injection technique: single-shot Needle Needle type: Quincke  Needle gauge: 22 G Needle length: 9 cm

## 2017-06-10 NOTE — Discharge Summary (Addendum)
Patient ID: Luis Stewart MRN: [id number] DOB/AGE: October 27, 1951 66 y.o.  Admit date: 06/10/2017 Discharge date: 06/12/2017  Admission Diagnoses:  Principal Problem:   Primary localized osteoarthritis of right knee Active Problems:   Prostate cancer [facility])   Discharge Diagnoses:  Same  Past Medical History:  Diagnosis Date  . Arthritis   . Asbestosis ([place])   . Cyst of kidney, acquired   . Headache   . History of fracture of leg    Right, childhood  . Hx of migraine headaches   . Hyperlipidemia   . Neuropathy   . 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 male who was admitted 06/10/2017 for operative treatment ofPrimary localized osteoarthritis of right knee. Patient has severe unremitting pain that affects sleep, daily activities, and work/hobbies. After pre-op clearance the patient was taken to the operating room on 06/10/2017 and underwent  Procedure(s): TOTAL KNEE ARTHROPLASTY.    Patient was given perioperative antibiotics:  Anti-infectives    Start     Dose/Rate Route Frequency Ordered Stop   06/10/17 1630  ceFAZolin (ANCEF) IVPB 2g/100 mL premix     2 g 200 mL/hr over 30 Minutes Intravenous Every 6 hours 06/10/17 1542 06/10/17 2201   06/10/17 0815  ceFAZolin (ANCEF) IVPB 2g/100 mL premix     2 g 200 mL/hr over 30 Minutes Intravenous On call to O.R. 06/09/17 1346 06/10/17 0855       Patient was given sequential compression devices, early ambulation, and chemoprophylaxis to prevent DVT.  Patient benefited maximally from hospital stay and there were no complications.    Recent vital signs:  Patient Vitals for the past 24 hrs:  BP Temp Temp src Pulse Resp SpO2  06/12/17 0433 (!) 148/75 99.7 F (37.6 C) Oral 67 18 97 %  06/11/17 2204 137/68 99.1 F (37.3 C) Oral 73 18 100 %   06/11/17 1700 136/69 98.4 F (36.9 C) Oral 63 20 99 %     Recent laboratory studies:   Recent Labs  06/11/17 0529 06/12/17 0554  WBC 6.9 9.5  HGB 11.5* 11.8*  HCT 34.3* 34.8*  PLT 133* 128*  NA 139 141  K 3.9 4.0  CL 107 107  CO2 28 28  BUN 10 13  CREATININE 1.18 1.17  GLUCOSE 151* 122*  CALCIUM 8.4* 8.8*     Discharge Medications:   Allergies as of 06/12/2017      Reactions   Sulfa Antibiotics Other (See Comments)   Unknown reaction. Childhood reaction      Medication List    STOP taking these medications   acetaminophen 500 MG tablet Commonly known as:  TYLENOL   amoxicillin 875 MG tablet Commonly known as:  AMOXIL   ibuprofen 800 MG tablet Commonly known as:  ADVIL,MOTRIN     TAKE these medications   aspirin EC 325 MG tablet Take 1 tablet (325 mg total) by mouth daily. 1 tab a day for the next 30 days to prevent blood clots   multivitamin with minerals Tabs tablet Take 1 tablet by mouth daily. Centrum Silver for Men   ondansetron 4 MG tablet Commonly known as:  ZOFRAN Take 1 tablet (4 mg total) by mouth every 8 (eight) hours as needed for nausea or vomiting.   oxyCODONE-acetaminophen 5-325 MG tablet Commonly known as:  ROXICET Take 1-2 tablets  by mouth every 4 (four) hours as needed.   Vitamin D 2000 units Caps Take 2,000 Units by mouth 3 (three) times a week.            Durable Medical Equipment        Start     Ordered   06/11/17 1448  For home use only DME 3 n 1  Once     06/11/17 1448      Diagnostic Studies: US Renal  Result Date: 05/27/2017 CLINICAL DATA:  Renal cyst EXAM: RENAL / URINARY TRACT ULTRASOUND COMPLETE COMPARISON:  CT 05/14/2017 FINDINGS: Right Kidney: Length: 11.1 cm. Normal echotexture. No hydronephrosis. Several small cysts, the largest in the inferior pole measures 1.2 cm. There is a 1.1 cm midpole and 1.1 cm upper pole cyst as well. The inferior pole cyst is irregular with probable internal septations. Other  cysts also demonstrate internal echoes. Calcification along the wall of the superior pole cyst suspected. Left Kidney: Length: 12.5 cm. Echogenicity within normal limits. No mass or hydronephrosis visualized. Bladder: Appears normal for degree of bladder distention. Enlarged prostate. IMPRESSION: 3 small complex cysts in the right kidney. These are nonspecific and warrant follow-up with repeat ultrasound in 6 months or further characterisation with MRI with and without contrast. Electronically Signed   By: Rolm Baptise M.D.   On: 05/27/2017 08:43   Dg Knee Right Port  Result Date: 06/10/2017 CLINICAL DATA:  Osteoarthritis.  Status post total knee replacement. EXAM: PORTABLE RIGHT KNEE - 1-2 VIEW COMPARISON:  03/05/2016 FINDINGS: Two views of the right knee demonstrate that the patient has undergone right total knee prosthesis insertion. The components appear in good position. Expected postsurgical air in the joint. IMPRESSION: Satisfactory appearance of the right knee after total knee replacement. Electronically Signed   By: Lorriane Shire M.D.   On: 06/10/2017 11:09   Ct Renal Stone Study  Result Date: 05/14/2017 CLINICAL DATA:  Acute left flank pain, microscopic hematuria. EXAM: CT ABDOMEN AND PELVIS WITHOUT CONTRAST TECHNIQUE: Multidetector CT imaging of the abdomen and pelvis was performed following the standard protocol without IV contrast. COMPARISON:  CT scan of July 28, 2002. MRI of pelvis of December 18, 2015. FINDINGS: Lower chest: No acute abnormality. Hepatobiliary: No gallstones are noted. Multiple cysts of varying sizes are noted throughout the hepatic parenchyma. These are increased in size and number compared to prior exam of 2003. Pancreas: Unremarkable. No pancreatic ductal dilatation or surrounding inflammatory changes. Spleen: Normal in size without focal abnormality. Adrenals/Urinary Tract: Adrenal glands are unremarkable. No hydronephrosis or renal obstruction is noted. No renal or  ureteral calculi are noted. Urinary bladder is unremarkable. 1.5 cm low density is noted in inferior pole of right kidney with peripheral hyper dense focus. This may represent complicated cyst. Stomach/Bowel: Stomach is within normal limits. Appendix appears normal. No evidence of bowel wall thickening, distention, or inflammatory changes. Vascular/Lymphatic: Aortic atherosclerosis. No enlarged abdominal or pelvic lymph nodes. Reproductive: Prostate gland is severely enlarged consistent with history of prostate cancer. It is significantly increased in size compared to prior exam of 2003. Other: No abdominal wall hernia or abnormality. No abdominopelvic ascites. Musculoskeletal: There is noted coarsened trabecula and some cortical thickening involving the sacrum consistent with Paget's disease. IMPRESSION: Multiple hepatic cysts of varying sizes are noted. No hydronephrosis or renal obstruction is noted. No renal or ureteral calculi are noted. 1.5 cm low density seen in inferior pole of right kidney with peripheral hyperdense focus. This may simply represent complicated cyst,  but further evaluation with ultrasound is recommended to rule out neoplasm. Aortic atherosclerosis. Prostate gland is severely enlarged consistent with history of prostate cancer. Findings most consistent with progressive Paget's disease involving the sacrum which is more prominent compared to prior exam of 2003. Electronically Signed   By: Marijo Conception, M.D.   On: 05/14/2017 17:03    Disposition: 01-Home or Self Care    Follow-up Information    [name], MD. Schedule an appointment as soon as possible for a visit in 2 weeks.   Specialty:  Orthopedic Surgery Contact information: [address] [place] [address] [phone]            Signed: Fannie Knee 06/12/2017, 8:35 AM

## 2017-06-10 NOTE — Transfer of Care (Signed)
Immediate Anesthesia Transfer of Care Note  Patient: Luis Stewart  Procedure(s) Performed: Procedure(s): TOTAL KNEE ARTHROPLASTY (Right)  Patient Location: PACU  Anesthesia Type:Spinal  Level of Consciousness: drowsy and patient cooperative  Airway & Oxygen Therapy: Patient Spontanous Breathing and Patient connected to face mask oxygen  Post-op Assessment: Report given to RN and Post -op Vital signs reviewed and stable  Post vital signs: Reviewed and stable  Last Vitals:  Vitals:   06/10/17 0702  BP: 133/75  Pulse: 62  Resp: 20  Temp: 36.9 C    Last Pain:  Vitals:   06/10/17 0711  TempSrc:   PainSc: 2          Complications: No apparent anesthesia complications

## 2017-06-10 NOTE — Anesthesia Preprocedure Evaluation (Signed)
Anesthesia Evaluation  Patient identified by MRN, date of birth, ID band Patient awake    Reviewed: Allergy & Precautions, NPO status , Patient's Chart, lab work & pertinent test results  Airway Mallampati: II  TM Distance: >3 FB Neck ROM: Full    Dental no notable dental hx.    Pulmonary neg pulmonary ROS,    Pulmonary exam normal breath sounds clear to auscultation       Cardiovascular negative cardio ROS Normal cardiovascular exam Rhythm:Regular Rate:Normal     Neuro/Psych  Headaches, negative neurological ROS  negative psych ROS   GI/Hepatic negative GI ROS, Neg liver ROS,   Endo/Other  negative endocrine ROS  Renal/GU negative Renal ROS  negative genitourinary   Musculoskeletal negative musculoskeletal ROS (+)   Abdominal   Peds negative pediatric ROS (+)  Hematology negative hematology ROS (+)   Anesthesia Other Findings   Reproductive/Obstetrics negative OB ROS                             Anesthesia Physical Anesthesia Plan  ASA: II  Anesthesia Plan: Spinal   Post-op Pain Management: GA combined w/ Regional for post-op pain   Induction:   PONV Risk Score and Plan: 1 and Ondansetron, Midazolam, Propofol and Treatment may vary due to age or medical condition  Airway Management Planned: Simple Face Mask  Additional Equipment:   Intra-op Plan:   Post-operative Plan:   Informed Consent: I have reviewed the patients History and Physical, chart, labs and discussed the procedure including the risks, benefits and alternatives for the proposed anesthesia with the patient or authorized representative who has indicated his/her understanding and acceptance.   Dental advisory given  Plan Discussed with: CRNA  Anesthesia Plan Comments:         Anesthesia Quick Evaluation

## 2017-06-10 NOTE — Anesthesia Postprocedure Evaluation (Signed)
Anesthesia Post Note  Patient: Luis Stewart  Procedure(s) Performed: Procedure(s) (LRB): TOTAL KNEE ARTHROPLASTY (Right)     Patient location during evaluation: PACU Anesthesia Type: Spinal Level of consciousness: oriented and awake and alert Pain management: pain level controlled Vital Signs Assessment: post-procedure vital signs reviewed and stable Respiratory status: spontaneous breathing and respiratory function stable Cardiovascular status: blood pressure returned to baseline and stable Postop Assessment: no headache and no backache Anesthetic complications: no    Last Vitals:  Vitals:   06/10/17 1040 06/10/17 1050  BP: 99/70 109/73  Pulse: 61 (!) 57  Resp: 16 17  Temp:      Last Pain:  Vitals:   06/10/17 1105  TempSrc:   PainSc: 0-No pain                 Lynda Rainwater

## 2017-06-10 NOTE — Interval H&P Note (Signed)
History and Physical Interval Note:  06/10/2017 8:30 AM  Luis Stewart  has presented today for surgery, with the diagnosis of OA RIGHT KNEE  The various methods of treatment have been discussed with the patient and family. After consideration of risks, benefits and other options for treatment, the patient has consented to  Procedure(s): TOTAL KNEE ARTHROPLASTY (Right) as a surgical intervention .  The patient's history has been reviewed, patient examined, no change in status, stable for surgery.  I have reviewed the patient's chart and labs.  Questions were answered to the patient's satisfaction.     [name]

## 2017-06-10 NOTE — Evaluation (Signed)
Physical Therapy Evaluation Patient Details Name: Luis Stewart MRN: [id number] DOB: May 29, 1951 Today's Date: 06/10/2017   History of Present Illness  Pt presents for R TKA with PMH of prostate cancer.  Clinical Impression  Pt is s/p TKA resulting in the deficits listed below (see PT Problem List). Eval limited due to pt feeling light headed and nearly passing out per NT report just before session. Squat pivot transfer to chair with min A and without complications. Pt able to perform basic TKA ther ex.  Pt will benefit from skilled PT to increase their independence and safety with mobility to allow discharge to the venue listed below.      Follow Up Recommendations DC plan and follow up therapy as arranged by surgeon    Equipment Recommendations  None recommended by PT    Recommendations for Other Services       Precautions / Restrictions Precautions Precautions: Knee Precaution Booklet Issued: No Precaution Comments: reviewed proper positioning and use of bone foam and CPM Restrictions Weight Bearing Restrictions: Yes RLE Weight Bearing: Weight bearing as tolerated      Mobility  Bed Mobility Overal bed mobility: Needs Assistance Bed Mobility: Supine to Sit     Supine to sit: Min assist     General bed mobility comments: min A to RLE and pt used rail  Transfers Overall transfer level: Needs assistance Equipment used: None Transfers: Squat Pivot Transfers     Squat pivot transfers: Min assist     General transfer comment: pt had just stood with NT and nearly passed out. Therefore, squat pivot was performed to prevent this reoccurence  Ambulation/Gait             General Gait Details: deferred due to dizziness  Stairs            Wheelchair Mobility    Modified Rankin (Stroke Patients Only)       Balance Overall balance assessment: Needs assistance Sitting-balance support: No upper extremity supported Sitting balance-Leahy Scale:  Good     Standing balance support: Bilateral upper extremity supported Standing balance-Leahy Scale: Poor Standing balance comment: needs UE support                             Pertinent Vitals/Pain Pain Assessment: Faces Faces Pain Scale: Hurts little more Pain Location: R knee Pain Descriptors / Indicators: Aching Pain Intervention(s): Limited activity within patient's tolerance;Monitored during session;Premedicated before session    Home Living Family/patient expects to be discharged to:: Private residence Living Arrangements: Spouse/significant other Available Help at Discharge: Family;Available 24 hours/day Type of Home: House Home Access: Stairs to enter Entrance Stairs-Rails: None (can reach door jamb) Entrance Stairs-Number of Steps: 2 Home Layout: One level Home Equipment: Walker - 2 wheels;Bedside commode      Prior Function Level of Independence: Independent               Hand Dominance        Extremity/Trunk Assessment   Upper Extremity Assessment Upper Extremity Assessment: Overall WFL for tasks assessed    Lower Extremity Assessment Lower Extremity Assessment: RLE deficits/detail RLE Deficits / Details: hip flex 3/5, knee ext 3/5, knee flex 15-85    Cervical / Trunk Assessment Cervical / Trunk Assessment: Normal  Communication   Communication: No difficulties  Cognition Arousal/Alertness: Awake/alert Behavior During Therapy: WFL for tasks assessed/performed Overall Cognitive Status: Within Functional Limits for tasks assessed  General Comments      Exercises Total Joint Exercises Ankle Circles/Pumps: AROM;Both;10 reps;Seated Quad Sets: AROM;10 reps;Both;Seated Long Arc Quad: AROM;Right;10 reps;Seated Goniometric ROM: 15-85   Assessment/Plan    PT Assessment Patient needs continued PT services  PT Problem List Decreased strength;Decreased range of motion;Decreased  activity tolerance;Decreased balance;Decreased mobility;Decreased knowledge of use of DME;Decreased knowledge of precautions;Pain       PT Treatment Interventions DME instruction;Gait training;Stair training;Functional mobility training;Therapeutic activities;Therapeutic exercise;Balance training;Patient/family education    PT Goals (Current goals can be found in the Care Plan section)  Acute Rehab PT Goals Patient Stated Goal: return home PT Goal Formulation: With patient/family Time For Goal Achievement: 06/17/17 Potential to Achieve Goals: Good    Frequency 7X/week   Barriers to discharge        Co-evaluation               AM-PAC PT "6 Clicks" Daily Activity  Outcome Measure Difficulty turning over in bed (including adjusting bedclothes, sheets and blankets)?: Total Difficulty moving from lying on back to sitting on the side of the bed? : Total Difficulty sitting down on and standing up from a chair with arms (e.g., wheelchair, bedside commode, etc,.)?: Total Help needed moving to and from a bed to chair (including a wheelchair)?: A Little Help needed walking in hospital room?: A Little Help needed climbing 3-5 steps with a railing? : A Little 6 Click Score: 12    End of Session Equipment Utilized During Treatment: Gait belt Activity Tolerance: Treatment limited secondary to medical complications (Comment) (near syncope prior to eval) Patient left: in chair;with call bell/phone within reach;with family/visitor present;with nursing/sitter in room Nurse Communication: Mobility status;Other (comment) (bladder discomfort) PT Visit Diagnosis: Muscle weakness (generalized) (M62.81);Pain;Difficulty in walking, not elsewhere classified (R26.2) Pain - Right/Left: Right Pain - part of body: Knee    Time: 1102-1119 PT Time Calculation (min) (ACUTE ONLY): 17 min   Charges:   PT Evaluation $PT Eval Low Complexity: 1 Procedure     PT G Codes:        Leighton Roach, PT   Acute Rehab Services  [place] 06/10/2017, 5:29 PM

## 2017-06-10 NOTE — Anesthesia Procedure Notes (Signed)
Anesthesia Regional Block: Adductor canal block   Pre-Anesthetic Checklist: ,, timeout performed, Correct Patient, Correct Site, Correct Laterality, Correct Procedure, Correct Position, site marked, Risks and benefits discussed,  Surgical consent,  Pre-op evaluation,  At surgeon's request and post-op pain management  Laterality: Right  Prep: Dura Prep       Needles:  Injection technique: Single-shot  Needle Type: Stimiplex     Needle Length: 9cm  Needle Gauge: 21     Additional Needles:   Procedures: ultrasound guided,,,,,,,,  Narrative:  Start time: 06/10/2017 8:08 AM End time: 06/10/2017 8:13 AM Injection made incrementally with aspirations every 5 mL.  Performed by: Personally  Anesthesiologist: Candida Peeling RAY

## 2017-06-10 NOTE — Discharge Instructions (Signed)

## 2017-06-11 ENCOUNTER — Encounter (HOSPITAL_COMMUNITY): Payer: Self-pay | Admitting: Orthopedic Surgery

## 2017-06-11 LAB — BASIC METABOLIC PANEL
Anion gap: 4 — ABNORMAL LOW (ref 5–15)
BUN: 10 mg/dL (ref 6–20)
CO2: 28 mmol/L (ref 22–32)
CREATININE: 1.18 mg/dL (ref 0.61–1.24)
Calcium: 8.4 mg/dL — ABNORMAL LOW (ref 8.9–10.3)
Chloride: 107 mmol/L (ref 101–111)
GFR calc Af Amer: >60 mL/min (ref >60)
GLUCOSE: 151 mg/dL — AB (ref 65–99)
POTASSIUM: 3.9 mmol/L (ref 3.5–5.1)
Sodium: 139 mmol/L (ref 135–145)

## 2017-06-11 LAB — CBC
HCT: 34.3 % — ABNORMAL LOW (ref 39.0–52.0)
Hemoglobin: 11.5 g/dL — ABNORMAL LOW (ref 13.0–17.0)
MCH: 29.9 pg (ref 26.0–34.0)
MCHC: 33.5 g/dL (ref 30.0–36.0)
MCV: 89.3 fL (ref 78.0–100.0)
PLATELETS: 133 10*3/uL — AB (ref 150–400)
RBC: 3.84 MIL/uL — AB (ref 4.22–5.81)
RDW: 14.2 % (ref 11.5–15.5)
WBC: 6.9 10*3/uL (ref 4.0–10.5)

## 2017-06-11 MED ORDER — OXYCODONE HCL 5 MG PO TABS
5.0000 mg | ORAL_TABLET | ORAL | Status: DC | PRN
  Administered 2017-06-11 (×2): 15 mg via ORAL
  Filled 2017-06-11 (×4): qty 3

## 2017-06-11 NOTE — Evaluation (Signed)
Occupational Therapy Evaluation Patient Details Name: Luis Stewart MRN: [id number] DOB: 08/29/1951 Today's Date: 06/11/2017    History of Present Illness Pt presents for R TKA with PMH of prostate cancer.   Clinical Impression   PTA, pt was living with his wife and was independent. Currently, pt requires Min Guard A for ADLs and functional mobility using RW. Provided education on LB ADLs, toilet transfer, and tub transfer with 3N1; pt demonstrated understanding. Answered all pt questions. Recommend dc home once medically stable per physician. All acute OT needs met and will sign off. Thank you.      Follow Up Recommendations  DC plan and follow up therapy as arranged by surgeon    Equipment Recommendations  3 in 1 bedside commode    Recommendations for Other Services PT consult     Precautions / Restrictions Precautions Precautions: Knee Precaution Booklet Issued: No Precaution Comments: reviewed proper positioning with knee in ex when resting Restrictions Weight Bearing Restrictions: Yes RLE Weight Bearing: Weight bearing as tolerated      Mobility Bed Mobility Overal bed mobility: Needs Assistance Bed Mobility: Supine to Sit     Supine to sit: HOB elevated;Supervision     General bed mobility comments: supervision for safety  Transfers Overall transfer level: Needs assistance Equipment used: Rolling walker (2 wheeled) Transfers: Sit to/from Stand Sit to Stand: Min guard         General transfer comment: VCs for hand placement    Balance Overall balance assessment: Needs assistance Sitting-balance support: No upper extremity supported Sitting balance-Leahy Scale: Good     Standing balance support: Single extremity supported;During functional activity Standing balance-Leahy Scale: Fair Standing balance comment: Patient able to perform LB dressing while maintaining standing balance to pull pants over hips                           ADL  either performed or assessed with clinical judgement   ADL Overall ADL's : Needs assistance/impaired Eating/Feeding: Set up;Sitting   Grooming: Set up;Sitting   Upper Body Bathing: Set up;Sitting   Lower Body Bathing: Min guard;Sit to/from stand   Upper Body Dressing : Set up;Sitting   Lower Body Dressing: Min guard;Sit to/from stand Lower Body Dressing Details (indicate cue type and reason): Educated pt on LB dressing techniques. Pt donned underwear with Min gaurd A for safety in standing Toilet Transfer: Min guard;Ambulation;RW (Simualted to recliner)       Tub/ Shower Transfer: Tub transfer;Min guard;Ambulation;3 in 1;Rolling walker;Cueing for sequencing Tub/Shower Transfer Details (indicate cue type and reason): Educated pt and wife on tub transfer with 3N1. Pt performed transfer with Min gaurd A for safety. Provided handout for carry over to home. Functional mobility during ADLs: Min guard;Rolling walker General ADL Comments: Pt performing well post surgery. Limited by pain. Performing ADLs and fucntional mobility with Min gaurd A for safety. Provided additional education on fall prevention and RW management.     Vision         Perception     Praxis      Pertinent Vitals/Pain Pain Assessment: 0-10 Pain Score: 6  Pain Location: R knee Pain Descriptors / Indicators: Aching Pain Intervention(s): Monitored during session;Repositioned;Ice applied     Hand Dominance     Extremity/Trunk Assessment Upper Extremity Assessment Upper Extremity Assessment: Overall WFL for tasks assessed   Lower Extremity Assessment Lower Extremity Assessment: Defer to PT evaluation   Cervical / Trunk Assessment Cervical /  Trunk Assessment: Normal   Communication Communication Communication: No difficulties   Cognition Arousal/Alertness: Awake/alert Behavior During Therapy: WFL for tasks assessed/performed Overall Cognitive Status: Within Functional Limits for tasks assessed                                      General Comments  Wife present throughout session    Exercises     Shoulder Instructions      Home Living Family/patient expects to be discharged to:: Private residence Living Arrangements: Spouse/significant other Available Help at Discharge: Family;Available 24 hours/day Type of Home: House Home Access: Stairs to enter [employer] of Steps: 2 Entrance Stairs-Rails: None (can reach door jamb) Home Layout: One level     Bathroom Shower/Tub: Tub/shower unit;Curtain   Bathroom Toilet: Standard (Comfort)     Home Equipment: Environmental consultant - 2 wheels;Bedside commode;Hand held shower head   Additional Comments: Arrived when DME supplier discussed 3N1 and RW      Prior Functioning/Environment Level of Independence: Independent                 OT Problem List: Decreased strength;Decreased range of motion;Decreased activity tolerance;Impaired balance (sitting and/or standing);Decreased safety awareness;Decreased knowledge of use of DME or AE;Decreased knowledge of precautions;Pain      OT Treatment/Interventions:      OT Goals(Current goals can be found in the care plan section) Acute Rehab OT Goals Patient Stated Goal: return home OT Goal Formulation: With patient Time For Goal Achievement: 06/25/17 Potential to Achieve Goals: Good  OT Frequency:     Barriers to D/C:            Co-evaluation              AM-PAC PT "6 Clicks" Daily Activity     Outcome Measure Help from another person eating meals?: None Help from another person taking care of personal grooming?: A Little Help from another person toileting, which includes using toliet, bedpan, or urinal?: A Little Help from another person bathing (including washing, rinsing, drying)?: A Little Help from another person to put on and taking off regular upper body clothing?: None Help from another person to put on and taking off regular lower body clothing?: A  Little 6 Click Score: 20   End of Session Equipment Utilized During Treatment: Gait belt;Rolling walker CPM Right Knee CPM Right Knee: Off (per patient) Nurse Communication: Mobility status;Weight bearing status  Activity Tolerance: Patient tolerated treatment well Patient left: in chair;with call bell/phone within reach;with family/visitor present  OT Visit Diagnosis: Unsteadiness on feet (R26.81);Other abnormalities of gait and mobility (R26.89);Muscle weakness (generalized) (M62.81);Pain Pain - Right/Left: Right Pain - part of body: Knee                Time: 5110-2111 OT Time Calculation (min): 25 min Charges:  OT General Charges $OT Visit: 1 Procedure OT Treatments $Self Care/Home Management : 8-22 mins G-Codes:     [place], OTR/L Acute Rehab Pager: [phone] Office: [place] 06/11/2017, 10:15 AM

## 2017-06-11 NOTE — Progress Notes (Signed)
Physical Therapy Treatment Patient Details Name: Luis Stewart MRN: [id number] DOB: Nov 29, 1951 Today's Date: 06/11/2017    History of Present Illness Pt presents for R TKA with PMH of prostate cancer.    PT Comments    Patient is progressing well toward mobility goals. Continue to progress as tolerated.   Follow Up Recommendations  DC plan and follow up therapy as arranged by surgeon     Equipment Recommendations  None recommended by PT    Recommendations for Other Services       Precautions / Restrictions Precautions Precautions: Knee Precaution Booklet Issued: No Precaution Comments: reviewed positioning with pt and wife Restrictions Weight Bearing Restrictions: Yes RLE Weight Bearing: Weight bearing as tolerated    Mobility  Bed Mobility Overal bed mobility: Modified Independent             General bed mobility comments: increased time and effort  Transfers Overall transfer level: Needs assistance Equipment used: Rolling walker (2 wheeled) Transfers: Sit to/from Stand Sit to Stand: Supervision         General transfer comment: supervision for safety; cues for safe hand placement first trial  Ambulation/Gait Ambulation/Gait assistance: Supervision Ambulation Distance (Feet): 200 Feet Assistive device: Rolling walker (2 wheeled) Gait Pattern/deviations: Step-through pattern;Decreased stride length;Trunk flexed;Decreased stance time - right;Decreased step length - left Gait velocity: decreased   General Gait Details: cues for posture, use of AD, and increased L step length; pt with step to pattern initially but with step through pattern with increased distance   Stairs Stairs: Yes   Stair Management: No rails;Backwards;With walker Number of Stairs: 4 General stair comments: Cues for sequencing and RW placement, Wife present and assisted for carryover at home.  Handout issued.  Pt require min assistance.    Wheelchair Mobility    Modified  Rankin (Stroke Patients Only)       Balance     Sitting balance-Leahy Scale: Good       Standing balance-Leahy Scale: Fair                              Cognition Arousal/Alertness: Awake/alert Behavior During Therapy: WFL for tasks assessed/performed Overall Cognitive Status: Within Functional Limits for tasks assessed                                        Exercises Total Joint Exercises Ankle Circles/Pumps: AROM;Both;10 reps;Seated Quad Sets: AROM;10 reps;Right;Supine Towel Squeeze: AROM;Both;10 reps;Supine Short Arc Quad: AROM;Right;10 reps;Supine Heel Slides: AROM;Right;10 reps;Supine Hip ABduction/ADduction: AROM;Right;10 reps;Supine Straight Leg Raises: AROM;Right;10 reps;Supine Long Arc Quad: AROM;Right;10 reps;Seated Knee Flexion: AROM;Right;10 reps;Seated;Other (comment) (10 sec holds) Goniometric ROM: 86 degrees flexion on R knee.      General Comments        Pertinent Vitals/Pain Pain Assessment: Faces Pain Score: 6  Faces Pain Scale: Hurts little more Pain Location: R knee Pain Descriptors / Indicators: Aching Pain Intervention(s): Monitored during session;Repositioned    Home Living                      Prior Function            PT Goals (current goals can now be found in the care plan section) Acute Rehab PT Goals Patient Stated Goal: return home Potential to Achieve Goals: Good Progress towards PT goals: Progressing toward goals  Frequency    7X/week      PT Plan Current plan remains appropriate    Co-evaluation              AM-PAC PT "6 Clicks" Daily Activity  Outcome Measure  Difficulty turning over in bed (including adjusting bedclothes, sheets and blankets)?: A Little Difficulty moving from lying on back to sitting on the side of the bed? : A Little Difficulty sitting down on and standing up from a chair with arms (e.g., wheelchair, bedside commode, etc,.)?: A Little Help needed  moving to and from a bed to chair (including a wheelchair)?: A Little Help needed walking in hospital room?: A Little Help needed climbing 3-5 steps with a railing? : A Little 6 Click Score: 18    End of Session Equipment Utilized During Treatment: Gait belt Activity Tolerance: Patient tolerated treatment well Patient left: in chair;with call bell/phone within reach;with family/visitor present Nurse Communication: Mobility status PT Visit Diagnosis: Muscle weakness (generalized) (M62.81);Pain;Difficulty in walking, not elsewhere classified (R26.2) Pain - Right/Left: Right Pain - part of body: Knee     Time: 1525-1550 PT Time Calculation (min) (ACUTE ONLY): 25 min  Charges:  $Gait Training: 8-22 mins $Therapeutic Exercise: 8-22 mins                    G Codes:       Earney Navy, PTA Pager: [phone]     Darliss Cheney 06/11/2017, 4:06 PM

## 2017-06-11 NOTE — Op Note (Signed)
NAME:  Luis Stewart, Luis Stewart NO.:  [id number]  MEDICAL RECORD NO.:  [id number]  LOCATION:                                 FACILITY:  PHYSICIAN:  [name], M.D.      DATE OF BIRTH:  DATE OF PROCEDURE:  06/10/2017 DATE OF DISCHARGE:                              OPERATIVE REPORT   PREOPERATIVE DIAGNOSES:  Right knee end-stage arthritis, primary localized.  Flexion contracture.  POSTOPERATIVE DIAGNOSES:  Right knee end-stage arthritis, primary localized.  Flexion contracture.  PROCEDURE:  Modified minimally invasive right total knee replaced utilizing Stryker triathlon prosthesis.  All components are press-fit. Pegged posterior stabilized #6 femoral component.  #7 tibial component, 9 mm PS insert.  Resurfacing 38 mm patellar component.  SURGEON:  [name], M.D.  ASSISTANT:  Elmyra Ricks, PA., present throughout the entire case and necessary for timely completion of procedure.  ANESTHESIA:  Spinal.  BLOOD LOSS:  Minimal.  SPECIMENS:  None.  CULTURES:  None.  COMPLICATIONS:  None.  DRESSINGS:  Soft compressive knee immobilizer.  TOURNIQUET TIME:  1 hour.  DESCRIPTION OF PROCEDURE:  The patient was brought to the operating room, and after adequate anesthesia had been obtained, tourniquet applied.  Prepped and draped in usual sterile fashion.  Exsanguinated with elevation of Esmarch.  Tourniquet inflated to 300 mmHg.  Straight incision above the patella down the tibial tubercle.  Medial arthrotomy, vastus splitting.  Flexible intramedullary guide distal femur.  10 mm resection because of his flexion contracture.  Using epicondylar axis, the femur was sized, cut, and fitted for posterior stabilized pegged #6 component.  Proximal tibial resection with extramedullary guide.  Size #7 component.  Rotation set with trials and hand reamed.  Patella exposed.  Posterior 10 mm removed.  Drilled, sized, and fitted for a 38- mm component.  All  trials removed.  Copious irrigation.  All components well seated, hammered in place.  Polyethylene attached to tibia, knee reduced.  Patella pressed in place with a clamp.  At completion, excellent biomechanical axis.  Full motion.  Nicely balanced knee.  Good patellar tracking.  Injected with Exparel.  Irrigated once again. Arthrotomy closed with #1 Vicryl with subcutaneous and subcuticular closure.  Sterile compressive dressing applied.  Tourniquet deflated and removed.  Knee immobilizer applied.  Anesthesia reversed.  Brought to the recovery room.  Tolerated the surgery well.  No complications.     [name], M.D.   ______________________________ [name], M.D.    DFM/MEDQ  D:  06/10/2017  T:  06/10/2017  Job:  [id number]

## 2017-06-11 NOTE — Progress Notes (Signed)
Physical Therapy Treatment Patient Details Name: Luis Stewart MRN: [id number] DOB: [birth year] Today's Date: 06/11/2017    History of Present Illness Pt presents for R TKA with PMH of prostate cancer.    PT Comments    Pt performed increased gait and functional mobility during session. Reviewed stairs and HEP this am.  Will f/u for mobility in pm.     Follow Up Recommendations  DC plan and follow up therapy as arranged by surgeon     Equipment Recommendations  None recommended by PT    Recommendations for Other Services       Precautions / Restrictions Precautions Precautions: Knee Precaution Booklet Issued: No Precaution Comments: reviewed proper positioning with knee in ex when resting Restrictions Weight Bearing Restrictions: Yes RLE Weight Bearing: Weight bearing as tolerated    Mobility  Bed Mobility               General bed mobility comments: Seated in recliner on arrival.    Transfers Overall transfer level: Needs assistance Equipment used: Rolling walker (2 wheeled) Transfers: Sit to/from Stand Sit to Stand: Min guard         General transfer comment: VCs for hand placement and forward advancement of R LE to decreased pain during transition.    Ambulation/Gait Ambulation/Gait assistance: Supervision Ambulation Distance (Feet): 250 Feet Assistive device: Rolling walker (2 wheeled) Gait Pattern/deviations: Step-through pattern;Decreased stride length;Trunk flexed     General Gait Details: Cues for RW safety and sequencing.     Stairs Stairs: Yes   Stair Management: No rails;Backwards;With walker Number of Stairs: 4 General stair comments: Cues for sequencing and RW placement, Wife present and assisted for carryover at home.  Handout issued.  Pt require min assistance.    Wheelchair Mobility    Modified Rankin (Stroke Patients Only)       Balance                                            Cognition  Arousal/Alertness: Awake/alert Behavior During Therapy: WFL for tasks assessed/performed Overall Cognitive Status: Within Functional Limits for tasks assessed                                        Exercises Total Joint Exercises Ankle Circles/Pumps: AROM;Both;10 reps;Seated Quad Sets: AROM;10 reps;Right;Supine Towel Squeeze: AROM;Both;10 reps;Supine Short Arc Quad: AROM;Right;10 reps;Supine Heel Slides: AROM;Right;10 reps;Supine Hip ABduction/ADduction: AROM;Right;10 reps;Supine Straight Leg Raises: AROM;Right;10 reps;Supine Long Arc Quad: AROM;Right;10 reps;Seated Goniometric ROM: 86 degrees flexion on R knee.      General Comments        Pertinent Vitals/Pain Pain Assessment: 0-10 Pain Score: 6  Faces Pain Scale: Hurts little more Pain Location: R knee Pain Descriptors / Indicators: Aching Pain Intervention(s): Monitored during session;Repositioned    Home Living                      Prior Function            PT Goals (current goals can now be found in the care plan section) Acute Rehab PT Goals Patient Stated Goal: return home Potential to Achieve Goals: Good Progress towards PT goals: Progressing toward goals    Frequency    7X/week      PT Plan Current  plan remains appropriate    Co-evaluation              AM-PAC PT "6 Clicks" Daily Activity  Outcome Measure  Difficulty turning over in bed (including adjusting bedclothes, sheets and blankets)?: A Little Difficulty moving from lying on back to sitting on the side of the bed? : A Little Difficulty sitting down on and standing up from a chair with arms (e.g., wheelchair, bedside commode, etc,.)?: A Little Help needed moving to and from a bed to chair (including a wheelchair)?: A Little Help needed walking in hospital room?: A Little Help needed climbing 3-5 steps with a railing? : A Little 6 Click Score: 18    End of Session Equipment Utilized During Treatment: Gait  belt Activity Tolerance: Patient tolerated treatment well Patient left: in chair;with call bell/phone within reach;with family/visitor present;with nursing/sitter in room Nurse Communication: Mobility status PT Visit Diagnosis: Muscle weakness (generalized) (M62.81);Pain;Difficulty in walking, not elsewhere classified (R26.2) Pain - Right/Left: Right Pain - part of body: Knee     Time: 6015-6153 PT Time Calculation (min) (ACUTE ONLY): 25 min  Charges:  $Gait Training: 8-22 mins $Therapeutic Exercise: 8-22 mins                    G Codes:       Governor Rooks, PTA pager [phone]    Cristela Blue 06/11/2017, 2:45 PM

## 2017-06-11 NOTE — Progress Notes (Addendum)
Subjective: 1 Day Post-Op Procedure(s) (LRB): TOTAL KNEE ARTHROPLASTY (Right) Patient reports pain as moderate.  Some lightheadedness/dizziness yesterday.  None this am.  No chest pain/sob.  No nausea/vomiting.   Objective: Vital signs in last 24 hours: Temp:  [97.8 F (36.6 C)-99.4 F (37.4 C)] 99.4 F (37.4 C) (06/21 0151) Pulse Rate:  [43-66] 65 (06/21 0151) Resp:  [13-18] 18 (06/21 0151) BP: (91-149)/(51-92) 104/56 (06/21 0151) SpO2:  [94 %-100 %] 94 % (06/21 0151)  Intake/Output from previous day: 06/20 0701 - 06/21 0700 In: 3015 [P.O.:240; I.V.:2675; IV Piggyback:100] Out: 1870 [Urine:1850; Blood:20] Intake/Output this shift: No intake/output data recorded.  No results for input(s): HGB in the last 72 hours. No results for input(s): WBC, RBC, HCT, PLT in the last 72 hours. No results for input(s): NA, K, CL, CO2, BUN, CREATININE, GLUCOSE, CALCIUM in the last 72 hours. No results for input(s): LABPT, INR in the last 72 hours.  Neurologically intact Neurovascular intact Sensation intact distally Intact pulses distally Dorsiflexion/Plantar flexion intact Incision: dressing C/D/I No cellulitis present Compartment soft  Assessment/Plan: 1 Day Post-Op Procedure(s) (LRB): TOTAL KNEE ARTHROPLASTY (Right) Advance diet Up with therapy D/C IV fluids Plan for discharge tomorrow home with  hhpt WBAT RLE Will increase oxy IR to 5-15 Please remove ace bandage and apply ted hose Dry dressing change prn  Fannie Knee 06/11/2017, 7:03 AM

## 2017-06-11 NOTE — Care Management Note (Signed)
Case Management Note  Patient Details  Name: LILBURN STRAW MRN: [id number] Date of Birth: October 20, 1951  Subjective/Objective:                    Action/Plan: Plan is for patient to d/c home tomorrow with [facility] services. Pt was prearranged with [facility],. Santiago Glad with [facility] notified and is following.  Pt with orders for 3 in 1. Santiago Glad with [facility] notified and will deliver equipment to the room.   Expected Discharge Date:  06/12/17               Expected Discharge Plan:  [place]  In-House Referral:     Discharge planning Services  CM Consult  Post Acute Care Choice:  Durable Medical Equipment, Home Health Choice offered to:  Patient  DME Arranged:  3-N-1 DME Agency:  [place]:  PT [place]:  [place]  Status of Service:  In process, will continue to follow  If discussed at Long Length of Stay Meetings, dates discussed:    Additional Comments:  Pollie Friar, RN 06/11/2017, 2:49 PM

## 2017-06-12 LAB — BASIC METABOLIC PANEL
ANION GAP: 6 (ref 5–15)
BUN: 13 mg/dL (ref 6–20)
CALCIUM: 8.8 mg/dL — AB (ref 8.9–10.3)
CO2: 28 mmol/L (ref 22–32)
Chloride: 107 mmol/L (ref 101–111)
Creatinine, Ser: 1.17 mg/dL (ref 0.61–1.24)
GLUCOSE: 122 mg/dL — AB (ref 65–99)
POTASSIUM: 4 mmol/L (ref 3.5–5.1)
SODIUM: 141 mmol/L (ref 135–145)

## 2017-06-12 LAB — CBC
HCT: 34.8 % — ABNORMAL LOW (ref 39.0–52.0)
Hemoglobin: 11.8 g/dL — ABNORMAL LOW (ref 13.0–17.0)
MCH: 30 pg (ref 26.0–34.0)
MCHC: 33.9 g/dL (ref 30.0–36.0)
MCV: 88.5 fL (ref 78.0–100.0)
PLATELETS: 128 10*3/uL — AB (ref 150–400)
RBC: 3.93 MIL/uL — AB (ref 4.22–5.81)
RDW: 14 % (ref 11.5–15.5)
WBC: 9.5 10*3/uL (ref 4.0–10.5)

## 2017-06-12 NOTE — Plan of Care (Signed)
Problem: Safety: Goal: Ability to remain free from injury will improve Call bell and personal belongings in reach. Patient call for help when needed.

## 2017-06-12 NOTE — Progress Notes (Signed)
Subjective: 2 Days Post-Op Procedure(s) (LRB): TOTAL KNEE ARTHROPLASTY (Right) Patient reports pain as mild.  Doing very well this am  Objective: Vital signs in last 24 hours: Temp:  [98.4 F (36.9 C)-99.7 F (37.6 C)] 99.7 F (37.6 C) (06/22 0433) Pulse Rate:  [63-73] 67 (06/22 0433) Resp:  [18-20] 18 (06/22 0433) BP: (136-148)/(68-75) 148/75 (06/22 0433) SpO2:  [97 %-100 %] 97 % (06/22 0433)  Intake/Output from previous day: 06/21 0701 - 06/22 0700 In: 720 [P.O.:720] Out: -  Intake/Output this shift: No intake/output data recorded.   Recent Labs  06/11/17 0529 06/12/17 0554  HGB 11.5* 11.8*    Recent Labs  06/11/17 0529 06/12/17 0554  WBC 6.9 9.5  RBC 3.84* 3.93*  HCT 34.3* 34.8*  PLT 133* 128*    Recent Labs  06/11/17 0529 06/12/17 0554  NA 139 141  K 3.9 4.0  CL 107 107  CO2 28 28  BUN 10 13  CREATININE 1.18 1.17  GLUCOSE 151* 122*  CALCIUM 8.4* 8.8*   No results for input(s): LABPT, INR in the last 72 hours.  Neurologically intact Neurovascular intact Sensation intact distally Intact pulses distally Dorsiflexion/Plantar flexion intact Incision: dressing C/D/I No cellulitis present Compartment soft  Assessment/Plan: 2 Days Post-Op Procedure(s) (LRB): TOTAL KNEE ARTHROPLASTY (Right) Advance diet Up with therapy Discharge home with home health after first or second session of PT depending on how well he mobilizes PLEASE REMOVE ACE BANDAGE AND APPLY TED HOSE TO OPERATIVE EXTREMITY ABLA-mild and stable   Fannie Knee 06/12/2017, 8:33 AM

## 2017-06-12 NOTE — Progress Notes (Signed)
Physical Therapy Treatment Patient Details Name: Luis Stewart MRN: [id number] DOB: February 14, 1951 Today's Date: 06/12/2017    History of Present Illness Pt presents for R TKA with PMH of prostate cancer.    PT Comments    Pt performed gait, and reviewed stairs with his wife. Informed nursing that he is ready for d/c home.     Follow Up Recommendations  DC plan and follow up therapy as arranged by surgeon     Equipment Recommendations  None recommended by PT    Recommendations for Other Services       Precautions / Restrictions Precautions Precautions: Knee Precaution Booklet Issued: No Precaution Comments: reviewed positioning with pt and wife Restrictions Weight Bearing Restrictions: Yes RLE Weight Bearing: Weight bearing as tolerated    Mobility  Bed Mobility            General bed mobility comments: Pt in recliner on arrival.    Transfers Overall transfer level: Modified independent Equipment used: Rolling walker (2 wheeled) Transfers: Sit to/from Stand Sit to Stand: Modified independent (Device/Increase time)   Squat pivot transfers: Modified independent (Device/Increase time)     General transfer comment: Good technique, smooth transition.    Ambulation/Gait Ambulation/Gait assistance: Modified independent (Device/Increase time) Ambulation Distance (Feet): 600 Feet Assistive device: Rolling walker (2 wheeled) Gait Pattern/deviations: Step-through pattern;Trunk flexed Gait velocity: decreased   General Gait Details: Cues for upper trunk control but less needed than this am.     Stairs Stairs: Yes   Stair Management: No rails;Backwards;With walker Number of Stairs: 4 General stair comments: Practiced sequencing with his wife.  PTA oversaw stair negotiation and educated patient and spouse on correct technique.    Wheelchair Mobility    Modified Rankin (Stroke Patients Only)       Balance Overall balance assessment: Needs  assistance Sitting-balance support: No upper extremity supported Sitting balance-Leahy Scale: Good     Standing balance support: Single extremity supported;During functional activity Standing balance-Leahy Scale: Fair                              Cognition Arousal/Alertness: Awake/alert Behavior During Therapy: WFL for tasks assessed/performed Overall Cognitive Status: Within Functional Limits for tasks assessed                                        Exercises Total Joint Exercises Ankle Circles/Pumps: AROM;Both;10 reps;Seated Quad Sets: AROM;10 reps;Right;Supine Towel Squeeze: AROM;Both;10 reps;Supine Short Arc Quad: AROM;Right;10 reps;Supine Heel Slides: AROM;Right;10 reps;Supine Hip ABduction/ADduction: AROM;Right;10 reps;Supine Straight Leg Raises: AROM;Right;10 reps;Supine Long Arc Quad: AROM;Right;10 reps;Seated Goniometric ROM: 83 degrees flexion on R knee.      General Comments        Pertinent Vitals/Pain Pain Assessment: No/denies pain Pain Score: 6  Faces Pain Scale: Hurts little more Pain Location: R knee Pain Descriptors / Indicators: Aching Pain Intervention(s): Monitored during session;Repositioned    Home Living                      Prior Function            PT Goals (current goals can now be found in the care plan section) Acute Rehab PT Goals Patient Stated Goal: return home Potential to Achieve Goals: Good Progress towards PT goals: Progressing toward goals    Frequency    7X/week  PT Plan Current plan remains appropriate    Co-evaluation              AM-PAC PT "6 Clicks" Daily Activity  Outcome Measure  Difficulty turning over in bed (including adjusting bedclothes, sheets and blankets)?: A Little Difficulty moving from lying on back to sitting on the side of the bed? : A Little Difficulty sitting down on and standing up from a chair with arms (e.g., wheelchair, bedside commode,  etc,.)?: A Little Help needed moving to and from a bed to chair (including a wheelchair)?: A Little Help needed walking in hospital room?: A Little Help needed climbing 3-5 steps with a railing? : A Little 6 Click Score: 18    End of Session Equipment Utilized During Treatment: Gait belt Activity Tolerance: Patient tolerated treatment well Patient left: in chair;with call bell/phone within reach;with family/visitor present Nurse Communication: Mobility status PT Visit Diagnosis: Muscle weakness (generalized) (M62.81);Pain;Difficulty in walking, not elsewhere classified (R26.2) Pain - Right/Left: Right Pain - part of body: Knee     Time: 5789-7847 PT Time Calculation (min) (ACUTE ONLY): 28 min  Charges:  $Gait Training: 8-22 mins $Therapeutic Exercise: 8-22 mins                    G Codes:       Governor Rooks, PTA pager [phone]    Cristela Blue 06/12/2017, 5:20 PM

## 2017-06-12 NOTE — Progress Notes (Signed)
Physical Therapy Treatment Patient Details Name: Luis Stewart MRN: [id number] DOB: 1951/07/16 Today's Date: 06/12/2017    History of Present Illness Pt presents for R TKA with PMH of prostate cancer.    PT Comments    Pt performed increased gait and reviewed stair training and HEP.  MD wants pm session.  Will f/u in pm.    Follow Up Recommendations  DC plan and follow up therapy as arranged by surgeon     Equipment Recommendations  None recommended by PT    Recommendations for Other Services       Precautions / Restrictions Precautions Precautions: Knee Precaution Booklet Issued: No Precaution Comments: reviewed positioning with pt and wife Restrictions Weight Bearing Restrictions: Yes RLE Weight Bearing: Weight bearing as tolerated    Mobility  Bed Mobility Overal bed mobility: Modified Independent       Supine to sit: Modified independent (Device/Increase time)     General bed mobility comments: No assistance needed.    Transfers Overall transfer level: Modified independent Equipment used: Rolling walker (2 wheeled) Transfers: Sit to/from Stand Sit to Stand: Modified independent (Device/Increase time)   Squat pivot transfers: Modified independent (Device/Increase time)     General transfer comment: Good technique, smooth transition.    Ambulation/Gait Ambulation/Gait assistance: Supervision Ambulation Distance (Feet): 600 Feet Assistive device: Rolling walker (2 wheeled) Gait Pattern/deviations: Step-through pattern;Trunk flexed Gait velocity: decreased   General Gait Details: Cues for upper trunk control.   Stairs Stairs: Yes   Stair Management: No rails;Backwards;With walker Number of Stairs: 4 General stair comments: Cues for sequencing and RW placement.    Wheelchair Mobility    Modified Rankin (Stroke Patients Only)       Balance Overall balance assessment: Needs assistance Sitting-balance support: No upper extremity  supported Sitting balance-Leahy Scale: Good     Standing balance support: Single extremity supported;During functional activity Standing balance-Leahy Scale: Fair                              Cognition Arousal/Alertness: Awake/alert Behavior During Therapy: WFL for tasks assessed/performed Overall Cognitive Status: Within Functional Limits for tasks assessed                                        Exercises Total Joint Exercises Ankle Circles/Pumps: AROM;Both;10 reps;Seated Quad Sets: AROM;10 reps;Right;Supine Towel Squeeze: AROM;Both;10 reps;Supine Short Arc Quad: AROM;Right;10 reps;Supine Heel Slides: AROM;Right;10 reps;Supine Hip ABduction/ADduction: AROM;Right;10 reps;Supine Straight Leg Raises: AROM;Right;10 reps;Supine Long Arc Quad: AROM;Right;10 reps;Seated Goniometric ROM: 83 degrees flexion on R knee.      General Comments        Pertinent Vitals/Pain Pain Assessment: Faces Faces Pain Scale: Hurts little more Pain Location: R knee Pain Descriptors / Indicators: Aching Pain Intervention(s): Monitored during session;Repositioned    Home Living                      Prior Function            PT Goals (current goals can now be found in the care plan section) Acute Rehab PT Goals Patient Stated Goal: return home Potential to Achieve Goals: Good Progress towards PT goals: Progressing toward goals    Frequency    7X/week      PT Plan Current plan remains appropriate    Co-evaluation  AM-PAC PT "6 Clicks" Daily Activity  Outcome Measure  Difficulty turning over in bed (including adjusting bedclothes, sheets and blankets)?: A Little Difficulty moving from lying on back to sitting on the side of the bed? : A Little Difficulty sitting down on and standing up from a chair with arms (e.g., wheelchair, bedside commode, etc,.)?: A Little Help needed moving to and from a bed to chair (including a  wheelchair)?: A Little Help needed walking in hospital room?: A Little Help needed climbing 3-5 steps with a railing? : A Little 6 Click Score: 18    End of Session Equipment Utilized During Treatment: Gait belt Activity Tolerance: Patient tolerated treatment well Patient left: in chair;with call bell/phone within reach;with family/visitor present Nurse Communication: Mobility status PT Visit Diagnosis: Muscle weakness (generalized) (M62.81);Pain;Difficulty in walking, not elsewhere classified (R26.2) Pain - Right/Left: Right Pain - part of body: Knee     Time: 0350-0938 PT Time Calculation (min) (ACUTE ONLY): 23 min  Charges:  $Gait Training: 8-22 mins $Therapeutic Exercise: 8-22 mins                    G Codes:       Governor Rooks, PTA pager [place] 06/12/2017, 5:16 PM

## 2017-06-13 DIAGNOSIS — Z8546 Personal history of malignant neoplasm of prostate: Secondary | ICD-10-CM | POA: Diagnosis not present

## 2017-06-13 DIAGNOSIS — Z471 Aftercare following joint replacement surgery: Secondary | ICD-10-CM | POA: Diagnosis not present

## 2017-06-13 DIAGNOSIS — Z77090 Contact with and (suspected) exposure to asbestos: Secondary | ICD-10-CM | POA: Diagnosis not present

## 2017-06-13 DIAGNOSIS — Z96651 Presence of right artificial knee joint: Secondary | ICD-10-CM | POA: Diagnosis not present

## 2017-06-13 DIAGNOSIS — G629 Polyneuropathy, unspecified: Secondary | ICD-10-CM | POA: Diagnosis not present

## 2017-06-15 DIAGNOSIS — Z8546 Personal history of malignant neoplasm of prostate: Secondary | ICD-10-CM | POA: Diagnosis not present

## 2017-06-15 DIAGNOSIS — Z96651 Presence of right artificial knee joint: Secondary | ICD-10-CM | POA: Diagnosis not present

## 2017-06-15 DIAGNOSIS — G629 Polyneuropathy, unspecified: Secondary | ICD-10-CM | POA: Diagnosis not present

## 2017-06-15 DIAGNOSIS — Z77090 Contact with and (suspected) exposure to asbestos: Secondary | ICD-10-CM | POA: Diagnosis not present

## 2017-06-15 DIAGNOSIS — Z471 Aftercare following joint replacement surgery: Secondary | ICD-10-CM | POA: Diagnosis not present

## 2017-06-16 DIAGNOSIS — Z77090 Contact with and (suspected) exposure to asbestos: Secondary | ICD-10-CM | POA: Diagnosis not present

## 2017-06-16 DIAGNOSIS — G629 Polyneuropathy, unspecified: Secondary | ICD-10-CM | POA: Diagnosis not present

## 2017-06-16 DIAGNOSIS — Z8546 Personal history of malignant neoplasm of prostate: Secondary | ICD-10-CM | POA: Diagnosis not present

## 2017-06-16 DIAGNOSIS — Z96651 Presence of right artificial knee joint: Secondary | ICD-10-CM | POA: Diagnosis not present

## 2017-06-16 DIAGNOSIS — Z471 Aftercare following joint replacement surgery: Secondary | ICD-10-CM | POA: Diagnosis not present

## 2017-06-17 ENCOUNTER — Emergency Department (HOSPITAL_COMMUNITY): Payer: Medicare Other

## 2017-06-17 ENCOUNTER — Encounter (HOSPITAL_COMMUNITY): Payer: Self-pay | Admitting: *Deleted

## 2017-06-17 ENCOUNTER — Observation Stay (HOSPITAL_COMMUNITY)
Admission: EM | Admit: 2017-06-17 | Discharge: 2017-06-18 | Disposition: A | Discharge status: Home / Self Care | Payer: Medicare Other | Attending: Internal Medicine | Admitting: Internal Medicine

## 2017-06-17 DIAGNOSIS — I82409 Acute embolism and thrombosis of unspecified deep veins of unspecified lower extremity: Secondary | ICD-10-CM

## 2017-06-17 DIAGNOSIS — M881 Osteitis deformans of vertebrae: Secondary | ICD-10-CM | POA: Diagnosis not present

## 2017-06-17 DIAGNOSIS — I2699 Other pulmonary embolism without acute cor pulmonale: Principal | ICD-10-CM | POA: Insufficient documentation

## 2017-06-17 DIAGNOSIS — N281 Cyst of kidney, acquired: Secondary | ICD-10-CM | POA: Insufficient documentation

## 2017-06-17 DIAGNOSIS — Z882 Allergy status to sulfonamides status: Secondary | ICD-10-CM | POA: Diagnosis not present

## 2017-06-17 DIAGNOSIS — Z86711 Personal history of pulmonary embolism: Secondary | ICD-10-CM | POA: Diagnosis present

## 2017-06-17 DIAGNOSIS — Z79899 Other long term (current) drug therapy: Secondary | ICD-10-CM | POA: Insufficient documentation

## 2017-06-17 DIAGNOSIS — E559 Vitamin D deficiency, unspecified: Secondary | ICD-10-CM | POA: Insufficient documentation

## 2017-06-17 DIAGNOSIS — N400 Enlarged prostate without lower urinary tract symptoms: Secondary | ICD-10-CM | POA: Insufficient documentation

## 2017-06-17 DIAGNOSIS — E785 Hyperlipidemia, unspecified: Secondary | ICD-10-CM | POA: Diagnosis not present

## 2017-06-17 DIAGNOSIS — I700 Atherosclerosis of aorta: Secondary | ICD-10-CM | POA: Diagnosis not present

## 2017-06-17 DIAGNOSIS — Z7982 Long term (current) use of aspirin: Secondary | ICD-10-CM | POA: Insufficient documentation

## 2017-06-17 DIAGNOSIS — Z8546 Personal history of malignant neoplasm of prostate: Secondary | ICD-10-CM | POA: Diagnosis not present

## 2017-06-17 LAB — CBC
HEMATOCRIT: 34.8 % — AB (ref 39.0–52.0)
Hemoglobin: 11.9 g/dL — ABNORMAL LOW (ref 13.0–17.0)
MCH: 30.1 pg (ref 26.0–34.0)
MCHC: 34.2 g/dL (ref 30.0–36.0)
MCV: 88.1 fL (ref 78.0–100.0)
PLATELETS: 234 10*3/uL (ref 150–400)
RBC: 3.95 MIL/uL — ABNORMAL LOW (ref 4.22–5.81)
RDW: 14 % (ref 11.5–15.5)
WBC: 9.6 10*3/uL (ref 4.0–10.5)

## 2017-06-17 LAB — BASIC METABOLIC PANEL
Anion gap: 9 (ref 5–15)
BUN: 17 mg/dL (ref 6–20)
CHLORIDE: 100 mmol/L — AB (ref 101–111)
CO2: 28 mmol/L (ref 22–32)
CREATININE: 1.16 mg/dL (ref 0.61–1.24)
Calcium: 9.1 mg/dL (ref 8.9–10.3)
GFR calc Af Amer: >60 mL/min (ref >60)
GFR calc non Af Amer: >60 mL/min (ref >60)
GLUCOSE: 133 mg/dL — AB (ref 65–99)
POTASSIUM: 4.4 mmol/L (ref 3.5–5.1)
Sodium: 137 mmol/L (ref 135–145)

## 2017-06-17 LAB — TROPONIN I: Troponin I: <0.03 ng/mL (ref <0.03)

## 2017-06-17 MED ORDER — IOPAMIDOL (ISOVUE-370) INJECTION 76%
100.0000 mL | Freq: Once | INTRAVENOUS | Status: AC | PRN
  Administered 2017-06-18: 100 mL via INTRAVENOUS

## 2017-06-17 NOTE — ED Notes (Signed)
Received report on pt, pt lying semi fowler on stretcher, no distress noted, family at bedside, update given

## 2017-06-17 NOTE — ED Provider Notes (Signed)
[facility] DEPT Provider Note   CSN: [id number] Arrival date & time: 06/17/17  2158     History   Chief Complaint Chief Complaint  Patient presents with  . Chest Pain    HPI Luis Stewart is a 66 y.o. male.  Patient presents with right mid back pain that started about 3 AM today and has been progressively worsening throughout the day. The pain is also radiating to his right chest and upper abdomen. The pain is constant and worse with deep breathing. He states nothing makes it better in certain positions make it worse. Does feel some shortness of breath. No cough or fever. Notably patient had a replacement surgery by Dr. Percell Miller on June 20 and states he doing well from that standpoint. Denies fever. He is not on any anticoagulation has been taking aspirin daily. Denies any abdominal pain, vomiting or diarrhea. No pain with urination or testicular pain.   The history is provided by the patient.    Past Medical History:  Diagnosis Date  . Arthritis   . Asbestosis ([place])   . Cyst of kidney, acquired   . Headache   . History of fracture of leg    Right, childhood  . Hx of migraine headaches   . Hyperlipidemia   . Neuropathy   . 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 Primary localized osteoarthritis of right knee 06/10/2017  . Upper airway cough syndrome 08/29/2015  . Prostate cancer ([place]) 08/17/2015  . Exposure to asbestos 08/17/2015  . Personal history of colonic polyps 08/08/2014  . Joint pain 06/05/2014  . Vitamin D deficiency 06/05/2014  . BPH (benign prostatic hypertrophy) 06/01/2013  . Mild hyperlipidemia 06/01/2013  . Routine general medical examination at a health care facility 06/01/2013    Past Surgical History:  Procedure Laterality Date  . BIOPSY PROSTATE  2003 and 2005  . COLONOSCOPY  11/28/2008   Dr. Rourk:internal hemorrhoids/tortus colon/ascending colon polyps, tubular  adenomas  . COLONOSCOPY N/A 09/04/2014   Procedure: COLONOSCOPY;  Surgeon: Daneil Dolin, MD;  Location: [facility];  Service: Endoscopy;  Laterality: N/A;  9:30  . TOTAL KNEE ARTHROPLASTY Right 06/10/2017  . TOTAL KNEE ARTHROPLASTY Right 06/10/2017   Procedure: TOTAL KNEE ARTHROPLASTY;  Surgeon: [name], MD;  Location: [facility];  Service: Orthopedics;  Laterality: Right;       Home Medications    Prior to Admission medications   Medication Sig Start Date End Date Taking? Authorizing Provider  aspirin EC 325 MG tablet Take 1 tablet (325 mg total) by mouth daily. 1 tab a day for the next 30 days to prevent blood clots 06/10/17   Aundra Dubin, PA-C  Cholecalciferol (VITAMIN D) 2000 units CAPS Take 2,000 Units by mouth 3 (three) times a week.     [provider]  Multiple Vitamin (MULTIVITAMIN WITH MINERALS) TABS tablet Take 1 tablet by mouth daily. Centrum Silver for Men    [provider]  ondansetron (ZOFRAN) 4 MG tablet Take 1 tablet (4 mg total) by mouth every 8 (eight) hours as needed for nausea or vomiting. 06/10/17   Aundra Dubin, PA-C  oxyCODONE-acetaminophen (ROXICET) 5-325 MG tablet Take 1-2 tablets by mouth every 4 (four) hours as needed. 06/10/17   Aundra Dubin, PA-C    Family History Family History  Problem Relation Age of Onset  . Diabetes Mother   . Hypertension  Mother   . Obesity Mother   . Heart disease Mother   . Mental illness Mother   . Hypertension Sister   . Obesity Sister   . Arthritis Sister   . Deep vein thrombosis Father   . Dementia Father   . Heart disease Father        CABG  . Colon cancer Neg Hx     Social History Social History  Substance Use Topics  . Smoking status: Never Smoker  . Smokeless tobacco: Never Used  . Alcohol use No     Allergies   Sulfa antibiotics   Review of Systems Review of Systems  Constitutional: Negative for activity change, appetite change and fever.  HENT: Negative for  congestion and rhinorrhea.   Eyes: Negative for visual disturbance.  Respiratory: Positive for chest tightness and shortness of breath. Negative for cough.   Cardiovascular: Positive for chest pain.  Gastrointestinal: Negative for abdominal pain, nausea and vomiting.  Genitourinary: Negative for dysuria, hematuria and testicular pain.  Musculoskeletal: Positive for arthralgias and myalgias.  Neurological: Negative for dizziness and headaches.   all other systems are negative except as noted in the HPI and PMH.    Physical Exam Updated Vital Signs BP 132/74 (BP Location: Right Arm)   Pulse 89   Temp 98.4 F (36.9 C) (Oral)   Resp 18   Ht 6\' 2"  (1.88 m)   Wt 79.8 kg (176 lb)   SpO2 97%   BMI 22.60 kg/m   Physical Exam  Constitutional: He is oriented to person, place, and time. He appears well-developed and well-nourished. No distress.  HENT:  Head: Normocephalic and atraumatic.  Mouth/Throat: Oropharynx is clear and moist. No oropharyngeal exudate.  Eyes: Conjunctivae and EOM are normal. Pupils are equal, round, and reactive to light.  Neck: Normal range of motion. Neck supple.  No meningismus.  Cardiovascular: Normal rate, regular rhythm, normal heart sounds and intact distal pulses.   No murmur heard. Pulmonary/Chest: Effort normal and breath sounds normal. No respiratory distress. He exhibits tenderness.  R sided pleuritic chest pain with breathing  Abdominal: Soft. There is no tenderness. There is no rebound and no guarding.  Musculoskeletal: Normal range of motion. He exhibits edema and tenderness.  Edema and warmth to R knee. No significant erythema  Neurological: He is alert and oriented to person, place, and time. No cranial nerve deficit. He exhibits normal muscle tone. Coordination normal.  No ataxia on finger to nose bilaterally. No pronator drift. 5/5 strength throughout. CN 2-12 intact.Equal grip strength. Sensation intact.   Skin: Skin is warm.  Psychiatric: He  has a normal mood and affect. His behavior is normal.  Nursing note and vitals reviewed.    ED Treatments / Results  Labs (all labs ordered are listed, but only abnormal results are displayed) Labs Reviewed  BASIC METABOLIC PANEL - Abnormal; Notable for the following:       Result Value   Chloride 100 (*)    Glucose, Bld 133 (*)    All other components within normal limits  CBC - Abnormal; Notable for the following:    RBC 3.95 (*)    Hemoglobin 11.9 (*)    HCT 34.8 (*)    All other components within normal limits  D-DIMER, QUANTITATIVE (NOT AT [facility]) - Abnormal; Notable for the following:    D-Dimer, Quant 9.18 (*)    All other components within normal limits  TROPONIN I  URINALYSIS, ROUTINE W REFLEX MICROSCOPIC  APTT  PROTIME-INR    EKG  EKG Interpretation  Date/Time:  Wednesday June 17 2017 22:01:44 EDT Ventricular Rate:  100 PR Interval:  138 QRS Duration: 90 QT Interval:  344 QTC Calculation: 443 R Axis:   80 Text Interpretation:  Normal sinus rhythm Left ventricular hypertrophy Abnormal ECG similar to previous EKG 05/29/2017  Confirmed by Brantley Stage [phone]) on 06/17/2017 10:08:56 PM       Radiology Ct Angio Chest Pe W And/or Wo Contrast  Result Date: 06/18/2017 CLINICAL DATA:  66 y/o M; chest pain shortness of breath. Back pain. Recent knee surgery. EXAM: CT ANGIOGRAPHY CHEST CT ABDOMEN AND PELVIS WITH CONTRAST TECHNIQUE: Multidetector CT imaging of the chest was performed using the standard protocol during bolus administration of intravenous contrast. Multiplanar CT image reconstructions and MIPs were obtained to evaluate the vascular anatomy. Multidetector CT imaging of the abdomen and pelvis was performed using the standard protocol during bolus administration of intravenous contrast. CONTRAST:  100 cc Isovue 370 COMPARISON:  05/14/2017 CT abdomen and pelvis. 09/27/2015 CT of the chest. FINDINGS: CTA CHEST FINDINGS Cardiovascular: Multiple segmental pulmonary emboli  in the right lung. RV LV ratio equals 0.8. Normal size heart. No pericardial effusion. Normal caliber thoracic aorta. Mediastinum/Nodes: No enlarged mediastinal, hilar, or axillary lymph nodes. Thyroid gland, trachea, and esophagus demonstrate no significant findings. Lungs/Pleura: Lungs are clear. No pleural effusion or pneumothorax. Musculoskeletal: No chest wall abnormality. No acute or significant osseous findings. Review of the MIP images confirms the above findings. CT ABDOMEN and PELVIS FINDINGS Hepatobiliary: Numerous liver cysts are grossly stable. No new focal liver lesion identified. Normal gallbladder. No intra or extrahepatic biliary ductal dilatation. Pancreas: Unremarkable. No pancreatic ductal dilatation or surrounding inflammatory changes. Spleen: Normal in size without focal abnormality. Adrenals/Urinary Tract: Normal adrenal glands. Multiple renal cysts the largest in the right lower pole measuring up to 12 mm. No hydronephrosis or urinary stone disease identified. Normal bladder. Stomach/Bowel: Stomach is within normal limits. Appendix appears normal. No evidence of bowel wall thickening, distention, or inflammatory changes. Vascular/Lymphatic: Aortic atherosclerosis. No enlarged abdominal or pelvic lymph nodes. Reproductive: Massively enlarged prostate gland measuring 7.7 x 8.4 x 11.5 cm (volume = 390 cm^3). Other: No abdominal wall hernia or abnormality. No abdominopelvic ascites. Musculoskeletal: Sacral Paget's disease. No acute osseous abnormality is evident. Review of the MIP images confirms the above findings. IMPRESSION: 1. Multiple segmental acute pulmonary emboli in the right lung. No CT evidence for right heart strain. 2. Multiple liver and renal cysts. 3. Massively enlarged prostate gland, volume approximately 390 cc. 4. Stable findings of sacral Paget's disease. These results were called by telephone at the time of interpretation on 06/18/2017 at 12:54 am to Dr. Ezequiel Essex , who  verbally acknowledged these results. Electronically Signed   By: Kristine Garbe M.D.   On: 06/18/2017 00:56   Ct Abdomen Pelvis W Contrast  Result Date: 06/18/2017 CLINICAL DATA:  66 y/o M; chest pain shortness of breath. Back pain. Recent knee surgery. EXAM: CT ANGIOGRAPHY CHEST CT ABDOMEN AND PELVIS WITH CONTRAST TECHNIQUE: Multidetector CT imaging of the chest was performed using the standard protocol during bolus administration of intravenous contrast. Multiplanar CT image reconstructions and MIPs were obtained to evaluate the vascular anatomy. Multidetector CT imaging of the abdomen and pelvis was performed using the standard protocol during bolus administration of intravenous contrast. CONTRAST:  100 cc Isovue 370 COMPARISON:  05/14/2017 CT abdomen and pelvis. 09/27/2015 CT of the chest. FINDINGS: CTA CHEST FINDINGS Cardiovascular: Multiple segmental pulmonary  emboli in the right lung. RV LV ratio equals 0.8. Normal size heart. No pericardial effusion. Normal caliber thoracic aorta. Mediastinum/Nodes: No enlarged mediastinal, hilar, or axillary lymph nodes. Thyroid gland, trachea, and esophagus demonstrate no significant findings. Lungs/Pleura: Lungs are clear. No pleural effusion or pneumothorax. Musculoskeletal: No chest wall abnormality. No acute or significant osseous findings. Review of the MIP images confirms the above findings. CT ABDOMEN and PELVIS FINDINGS Hepatobiliary: Numerous liver cysts are grossly stable. No new focal liver lesion identified. Normal gallbladder. No intra or extrahepatic biliary ductal dilatation. Pancreas: Unremarkable. No pancreatic ductal dilatation or surrounding inflammatory changes. Spleen: Normal in size without focal abnormality. Adrenals/Urinary Tract: Normal adrenal glands. Multiple renal cysts the largest in the right lower pole measuring up to 12 mm. No hydronephrosis or urinary stone disease identified. Normal bladder. Stomach/Bowel: Stomach is within  normal limits. Appendix appears normal. No evidence of bowel wall thickening, distention, or inflammatory changes. Vascular/Lymphatic: Aortic atherosclerosis. No enlarged abdominal or pelvic lymph nodes. Reproductive: Massively enlarged prostate gland measuring 7.7 x 8.4 x 11.5 cm (volume = 390 cm^3). Other: No abdominal wall hernia or abnormality. No abdominopelvic ascites. Musculoskeletal: Sacral Paget's disease. No acute osseous abnormality is evident. Review of the MIP images confirms the above findings. IMPRESSION: 1. Multiple segmental acute pulmonary emboli in the right lung. No CT evidence for right heart strain. 2. Multiple liver and renal cysts. 3. Massively enlarged prostate gland, volume approximately 390 cc. 4. Stable findings of sacral Paget's disease. These results were called by telephone at the time of interpretation on 06/18/2017 at 12:54 am to Dr. Ezequiel Essex , who verbally acknowledged these results. Electronically Signed   By: Kristine Garbe M.D.   On: 06/18/2017 00:56    Procedures Procedures (including critical care time)  Medications Ordered in ED Medications - No data to display   Initial Impression / Assessment and Plan / ED Course  I have reviewed the triage vital signs and the nursing notes.  Pertinent labs & imaging results that were available during my care of the patient were reviewed by me and considered in my medical decision making (see chart for details).     Pleuritic chest and upper back pain ongoing since 3 AM. Recent knee surgery 1 week ago. EKG shows sinus rhythm with LVH that is unchanged.  Concern for pulmonary embolism. Troponin is negative.  CT scan obtained and shows multiple right-sided pulmonary emboli. No evidence of right heart strain. Patient has mild tachypnea but no hypoxia. Blood pressure and heart rate and normal.  Patient started on IV heparin. Pain has improved. Admission discussed with Dr. Darrick Meigs.  CRITICAL CARE Performed  by: Ezequiel Essex Total critical care time: 35 minutes Critical care time was exclusive of separately billable procedures and treating other patients. Critical care was necessary to treat or prevent imminent or life-threatening deterioration. Critical care was time spent personally by me on the following activities: development of treatment plan with patient and/or surrogate as well as nursing, discussions with consultants, evaluation of patient's response to treatment, examination of patient, obtaining history from patient or surrogate, ordering and performing treatments and interventions, ordering and review of laboratory studies, ordering and review of radiographic studies, pulse oximetry and re-evaluation of patient's condition.    Final Clinical Impressions(s) / ED Diagnoses   Final diagnoses:  Other acute pulmonary embolism without acute cor pulmonale ([place])    New Prescriptions New Prescriptions   No medications on file     Ezequiel Essex, MD 06/18/17 [phone]

## 2017-06-17 NOTE — ED Triage Notes (Signed)
Pt c/o chest pain and sob that started yesterday, had knee surgery 06/10/2017

## 2017-06-17 NOTE — ED Notes (Signed)
ED Provider at bedside. 

## 2017-06-18 ENCOUNTER — Observation Stay (HOSPITAL_COMMUNITY): Payer: Medicare Other

## 2017-06-18 DIAGNOSIS — I82491 Acute embolism and thrombosis of other specified deep vein of right lower extremity: Secondary | ICD-10-CM | POA: Diagnosis not present

## 2017-06-18 DIAGNOSIS — N281 Cyst of kidney, acquired: Secondary | ICD-10-CM | POA: Diagnosis not present

## 2017-06-18 DIAGNOSIS — I82431 Acute embolism and thrombosis of right popliteal vein: Secondary | ICD-10-CM

## 2017-06-18 DIAGNOSIS — Z86711 Personal history of pulmonary embolism: Secondary | ICD-10-CM | POA: Diagnosis present

## 2017-06-18 DIAGNOSIS — I82441 Acute embolism and thrombosis of right tibial vein: Secondary | ICD-10-CM | POA: Diagnosis not present

## 2017-06-18 DIAGNOSIS — I2699 Other pulmonary embolism without acute cor pulmonale: Secondary | ICD-10-CM | POA: Diagnosis not present

## 2017-06-18 LAB — COMPREHENSIVE METABOLIC PANEL
ALBUMIN: 3.2 g/dL — AB (ref 3.5–5.0)
ALT: 22 U/L (ref 17–63)
AST: 19 U/L (ref 15–41)
Alkaline Phosphatase: 53 U/L (ref 38–126)
Anion gap: 7 (ref 5–15)
BUN: 16 mg/dL (ref 6–20)
CHLORIDE: 103 mmol/L (ref 101–111)
CO2: 28 mmol/L (ref 22–32)
Calcium: 8.9 mg/dL (ref 8.9–10.3)
Creatinine, Ser: 1.1 mg/dL (ref 0.61–1.24)
GFR calc Af Amer: >60 mL/min (ref >60)
GFR calc non Af Amer: >60 mL/min (ref >60)
GLUCOSE: 110 mg/dL — AB (ref 65–99)
POTASSIUM: 4.4 mmol/L (ref 3.5–5.1)
Sodium: 138 mmol/L (ref 135–145)
Total Bilirubin: 1.3 mg/dL — ABNORMAL HIGH (ref 0.3–1.2)
Total Protein: 6.9 g/dL (ref 6.5–8.1)

## 2017-06-18 LAB — D-DIMER, QUANTITATIVE: D-Dimer, Quant: 9.18 ug/mL-FEU — ABNORMAL HIGH (ref 0.00–0.50)

## 2017-06-18 LAB — URINALYSIS, ROUTINE W REFLEX MICROSCOPIC
Bilirubin Urine: NEGATIVE
GLUCOSE, UA: NEGATIVE mg/dL
Hgb urine dipstick: NEGATIVE
KETONES UR: NEGATIVE mg/dL
LEUKOCYTES UA: NEGATIVE
NITRITE: NEGATIVE
PROTEIN: NEGATIVE mg/dL
Specific Gravity, Urine: 1.013 (ref 1.005–1.030)
pH: 5 (ref 5.0–8.0)

## 2017-06-18 LAB — CBC
HCT: 32.5 % — ABNORMAL LOW (ref 39.0–52.0)
Hemoglobin: 11.2 g/dL — ABNORMAL LOW (ref 13.0–17.0)
MCH: 30.4 pg (ref 26.0–34.0)
MCHC: 34.5 g/dL (ref 30.0–36.0)
MCV: 88.3 fL (ref 78.0–100.0)
PLATELETS: 236 10*3/uL (ref 150–400)
RBC: 3.68 MIL/uL — ABNORMAL LOW (ref 4.22–5.81)
RDW: 14.1 % (ref 11.5–15.5)
WBC: 8 10*3/uL (ref 4.0–10.5)

## 2017-06-18 LAB — PROTIME-INR
INR: 1.19
PROTHROMBIN TIME: 15.2 s (ref 11.4–15.2)

## 2017-06-18 LAB — APTT: aPTT: 36 seconds (ref 24–36)

## 2017-06-18 MED ORDER — HEPARIN (PORCINE) IN NACL 100-0.45 UNIT/ML-% IJ SOLN
1400.0000 [IU]/h | INTRAMUSCULAR | Status: DC
  Administered 2017-06-18: 1400 [IU]/h via INTRAVENOUS
  Filled 2017-06-18: qty 250

## 2017-06-18 MED ORDER — SODIUM CHLORIDE 0.9 % IV SOLN: 250.0000 mL | INTRAVENOUS | Status: DC | PRN

## 2017-06-18 MED ORDER — HEPARIN BOLUS VIA INFUSION
4000.0000 [IU] | Freq: Once | INTRAVENOUS | Status: AC
  Administered 2017-06-18: 4000 [IU] via INTRAVENOUS

## 2017-06-18 MED ORDER — ONDANSETRON HCL 4 MG/2ML IJ SOLN: 4.0000 mg | Freq: Four times a day (QID) | INTRAMUSCULAR | Status: DC | PRN

## 2017-06-18 MED ORDER — ONDANSETRON HCL 4 MG PO TABS: 4.0000 mg | ORAL_TABLET | Freq: Four times a day (QID) | ORAL | Status: DC | PRN

## 2017-06-18 MED ORDER — RIVAROXABAN 20 MG PO TABS: 20.0000 mg | ORAL_TABLET | Freq: Every day | ORAL | Status: DC

## 2017-06-18 MED ORDER — SODIUM CHLORIDE 0.9% FLUSH: 3.0000 mL | INTRAVENOUS | Status: DC | PRN

## 2017-06-18 MED ORDER — RIVAROXABAN 15 MG PO TABS: 15.0000 mg | ORAL_TABLET | Freq: Two times a day (BID) | ORAL | Status: DC

## 2017-06-18 MED ORDER — SODIUM CHLORIDE 0.9% FLUSH
3.0000 mL | Freq: Two times a day (BID) | INTRAVENOUS | Status: DC
  Administered 2017-06-18: 3 mL via INTRAVENOUS

## 2017-06-18 MED ORDER — MORPHINE SULFATE (PF) 2 MG/ML IV SOLN
2.0000 mg | INTRAVENOUS | Status: DC | PRN
  Administered 2017-06-18: 2 mg via INTRAVENOUS
  Filled 2017-06-18: qty 1

## 2017-06-18 MED ORDER — OXYCODONE-ACETAMINOPHEN 5-325 MG PO TABS: 1.0000 | ORAL_TABLET | ORAL | Status: DC | PRN

## 2017-06-18 MED ORDER — RIVAROXABAN 15 MG PO TABS
15.0000 mg | ORAL_TABLET | Freq: Two times a day (BID) | ORAL | Status: DC
  Administered 2017-06-18: 15 mg via ORAL
  Filled 2017-06-18 (×3): qty 1

## 2017-06-18 MED ORDER — FENTANYL CITRATE (PF) 100 MCG/2ML IJ SOLN
50.0000 ug | Freq: Once | INTRAMUSCULAR | Status: AC
  Administered 2017-06-18: 50 ug via INTRAVENOUS
  Filled 2017-06-18: qty 2

## 2017-06-18 MED ORDER — RIVAROXABAN (XARELTO) VTE STARTER PACK (15 & 20 MG): ORAL_TABLET | ORAL | 0 refills | Status: DC

## 2017-06-18 NOTE — Discharge Summary (Signed)
Physician Discharge Summary  Luis Stewart GLO:[id number] DOB: Oct 02, 1951 DOA: 06/17/2017  PCP: Alycia Rossetti, MD  Admit date: 06/17/2017 Discharge date: 06/18/2017  Admitted From: home  Disposition:  home  Recommendations for Outpatient Follow-up:  1. Follow up with PCP in 1-2 weeks 2. Please obtain BMP/CBC at follow up appointment 3. Started xarelto on 6/28 in AM.  Treat for three months (Sept 28th) then reassess.  Home Health:  none  Equipment/Devices:  Continue CMT as before  Discharge Condition:  Stable, improved CODE STATUS:  Full code  Diet recommendation:  Regular  Brief/Interim Summary:  Luis Stewart  is a 66 y.o. male with history of osteoarthritis status post total right knee arthroplasty on 06/10/2017 who presented with worsening right-sided sharp pleuritic chest pain.  Patient was discharged on aspirin 325 mg daily after the surgery.  He denied SOB initially, but later admitted some SOB with talking and exertion to myself.  CT chest confirmed a right segmental pulmonary emboli. Lower extremity duplex demonstrated within the right posterior tibial and peroneal calf veins which were occluded and noncompressible.  He had no evidence of heart strain on his CT chest. His initial troponin was negative. The pressure was stable. His oxygen saturations remained in the high 90s at rest and with exertion on room air. He was initially started on a heparin drip but quickly transitioned his Xarelto. The case manager and nurse provided him with a 30 day assistance card and information about Xarelto.  He should complete a 3 month course for postoperative DVT/PE.  Discharge Diagnoses:  Active Problems:   Pulmonary embolism (HCC)  Acute pulmonary embolism with multiple segmental pulmonary emboli in the right lung -  Started Xarelto on 6/28 -  Provided with starter pack/30 day card -  Oxygen saturations remained in the high 90s with ambulation on room air, blood pressure  stable -  He continued to have some thoracic level back pain which improved with Percocet.  He received 60 tabs of Percocet after his surgery on 6/20 and he is only used a few times. He may use his remaining Percocet as needed for chest/back pain related to his pulmonary embolism. He claims that he has laxatives and stool softeners at home and he was advised to take these daily when taking Percocet. -  He may resume CMT tomorrow and is safe to resume therapy on Monday  S/p right total knee arthroplasty, appears stable. No evidence of erythema or wound dehiscence  Discharge Instructions  Discharge Instructions    Call MD for:  difficulty breathing, headache or visual disturbances    Complete by:  As directed    Call MD for:  extreme fatigue    Complete by:  As directed    Call MD for:  hives    Complete by:  As directed    Call MD for:  persistant dizziness or light-headedness    Complete by:  As directed    Call MD for:  persistant nausea and vomiting    Complete by:  As directed    Call MD for:  severe uncontrolled pain    Complete by:  As directed    Call MD for:  temperature >100.4    Complete by:  As directed    Diet general    Complete by:  As directed    Discharge instructions    Complete by:  As directed    Takes Xarelto 15 mg tab, 1 tab twice daily for 21 days. On 7/19,  start taking Xarelto 20 mg tabs, 1 tab once daily. Continue Xarelto for 3 months. Your last dose should be on or near September 28.   Driving Restrictions    Complete by:  As directed    Do not drive or operate heavy machinery while taking oxycodone.   Increase activity slowly    Complete by:  As directed        Medication List    STOP taking these medications   aspirin EC 325 MG tablet     TAKE these medications   docusate sodium 100 MG capsule Commonly known as:  COLACE Take 200 mg by mouth daily.   multivitamin with minerals Tabs tablet Take 1 tablet by mouth daily. Centrum Silver for Men    ondansetron 4 MG tablet Commonly known as:  ZOFRAN Take 1 tablet (4 mg total) by mouth every 8 (eight) hours as needed for nausea or vomiting.   oxyCODONE-acetaminophen 5-325 MG tablet Commonly known as:  ROXICET Take 1-2 tablets by mouth every 4 (four) hours as needed.   Rivaroxaban 15 & 20 MG Tbpk Start with one 15mg  tablet by mouth twice a day with food. On Day 22 (7/19), switch to one 20mg  tablet once a day with food.   Vitamin D 2000 units Caps Take 2,000 Units by mouth 3 (three) times a week.      Follow-up Information    Scott, Modena Nunnery, MD. Schedule an appointment as soon as possible for a visit in 1 week(s).   Specialty:  Family Medicine Contact information: [address] [place] [address] [phone]          Allergies  Allergen Reactions  . Sulfa Antibiotics Other (See Comments)    Unknown reaction. Childhood reaction    Consultations: None    Procedures/Studies: Ct Angio Chest Pe W And/or Wo Contrast  Result Date: 06/18/2017 CLINICAL DATA:  66 y/o M; chest pain shortness of breath. Back pain. Recent knee surgery. EXAM: CT ANGIOGRAPHY CHEST CT ABDOMEN AND PELVIS WITH CONTRAST TECHNIQUE: Multidetector CT imaging of the chest was performed using the standard protocol during bolus administration of intravenous contrast. Multiplanar CT image reconstructions and MIPs were obtained to evaluate the vascular anatomy. Multidetector CT imaging of the abdomen and pelvis was performed using the standard protocol during bolus administration of intravenous contrast. CONTRAST:  100 cc Isovue 370 COMPARISON:  05/14/2017 CT abdomen and pelvis. 09/27/2015 CT of the chest. FINDINGS: CTA CHEST FINDINGS Cardiovascular: Multiple segmental pulmonary emboli in the right lung. RV LV ratio equals 0.8. Normal size heart. No pericardial effusion. Normal caliber thoracic aorta. Mediastinum/Nodes: No enlarged mediastinal, hilar, or axillary lymph nodes. Thyroid gland, trachea,  and esophagus demonstrate no significant findings. Lungs/Pleura: Lungs are clear. No pleural effusion or pneumothorax. Musculoskeletal: No chest wall abnormality. No acute or significant osseous findings. Review of the MIP images confirms the above findings. CT ABDOMEN and PELVIS FINDINGS Hepatobiliary: Numerous liver cysts are grossly stable. No new focal liver lesion identified. Normal gallbladder. No intra or extrahepatic biliary ductal dilatation. Pancreas: Unremarkable. No pancreatic ductal dilatation or surrounding inflammatory changes. Spleen: Normal in size without focal abnormality. Adrenals/Urinary Tract: Normal adrenal glands. Multiple renal cysts the largest in the right lower pole measuring up to 12 mm. No hydronephrosis or urinary stone disease identified. Normal bladder. Stomach/Bowel: Stomach is within normal limits. Appendix appears normal. No evidence of bowel wall thickening, distention, or inflammatory changes. Vascular/Lymphatic: Aortic atherosclerosis. No enlarged abdominal or pelvic lymph nodes. Reproductive: Massively  enlarged prostate gland measuring 7.7 x 8.4 x 11.5 cm (volume = 390 cm^3). Other: No abdominal wall hernia or abnormality. No abdominopelvic ascites. Musculoskeletal: Sacral Paget's disease. No acute osseous abnormality is evident. Review of the MIP images confirms the above findings. IMPRESSION: 1. Multiple segmental acute pulmonary emboli in the right lung. No CT evidence for right heart strain. 2. Multiple liver and renal cysts. 3. Massively enlarged prostate gland, volume approximately 390 cc. 4. Stable findings of sacral Paget's disease. These results were called by telephone at the time of interpretation on 06/18/2017 at 12:54 am to Dr. Ezequiel Essex , who verbally acknowledged these results. Electronically Signed   By: Kristine Garbe M.D.   On: 06/18/2017 00:56   Ct Abdomen Pelvis W Contrast  Result Date: 06/18/2017 CLINICAL DATA:  66 y/o M; chest pain  shortness of breath. Back pain. Recent knee surgery. EXAM: CT ANGIOGRAPHY CHEST CT ABDOMEN AND PELVIS WITH CONTRAST TECHNIQUE: Multidetector CT imaging of the chest was performed using the standard protocol during bolus administration of intravenous contrast. Multiplanar CT image reconstructions and MIPs were obtained to evaluate the vascular anatomy. Multidetector CT imaging of the abdomen and pelvis was performed using the standard protocol during bolus administration of intravenous contrast. CONTRAST:  100 cc Isovue 370 COMPARISON:  05/14/2017 CT abdomen and pelvis. 09/27/2015 CT of the chest. FINDINGS: CTA CHEST FINDINGS Cardiovascular: Multiple segmental pulmonary emboli in the right lung. RV LV ratio equals 0.8. Normal size heart. No pericardial effusion. Normal caliber thoracic aorta. Mediastinum/Nodes: No enlarged mediastinal, hilar, or axillary lymph nodes. Thyroid gland, trachea, and esophagus demonstrate no significant findings. Lungs/Pleura: Lungs are clear. No pleural effusion or pneumothorax. Musculoskeletal: No chest wall abnormality. No acute or significant osseous findings. Review of the MIP images confirms the above findings. CT ABDOMEN and PELVIS FINDINGS Hepatobiliary: Numerous liver cysts are grossly stable. No new focal liver lesion identified. Normal gallbladder. No intra or extrahepatic biliary ductal dilatation. Pancreas: Unremarkable. No pancreatic ductal dilatation or surrounding inflammatory changes. Spleen: Normal in size without focal abnormality. Adrenals/Urinary Tract: Normal adrenal glands. Multiple renal cysts the largest in the right lower pole measuring up to 12 mm. No hydronephrosis or urinary stone disease identified. Normal bladder. Stomach/Bowel: Stomach is within normal limits. Appendix appears normal. No evidence of bowel wall thickening, distention, or inflammatory changes. Vascular/Lymphatic: Aortic atherosclerosis. No enlarged abdominal or pelvic lymph nodes.  Reproductive: Massively enlarged prostate gland measuring 7.7 x 8.4 x 11.5 cm (volume = 390 cm^3). Other: No abdominal wall hernia or abnormality. No abdominopelvic ascites. Musculoskeletal: Sacral Paget's disease. No acute osseous abnormality is evident. Review of the MIP images confirms the above findings. IMPRESSION: 1. Multiple segmental acute pulmonary emboli in the right lung. No CT evidence for right heart strain. 2. Multiple liver and renal cysts. 3. Massively enlarged prostate gland, volume approximately 390 cc. 4. Stable findings of sacral Paget's disease. These results were called by telephone at the time of interpretation on 06/18/2017 at 12:54 am to Dr. Ezequiel Essex , who verbally acknowledged these results. Electronically Signed   By: Kristine Garbe M.D.   On: 06/18/2017 00:56  US Venous Img Lower Bilateral  Result Date: 06/18/2017 CLINICAL DATA:  Positive pulmonary embolus EXAM: BILATERAL LOWER EXTREMITY VENOUS DOPPLER ULTRASOUND TECHNIQUE: Gray-scale sonography with graded compression, as well as color Doppler and duplex ultrasound were performed to evaluate the lower extremity deep venous systems from the level of the common femoral vein and including the common femoral, femoral, profunda femoral, popliteal and calf  veins including the posterior tibial, peroneal and gastrocnemius veins when visible. The superficial great saphenous vein was also interrogated. Spectral Doppler was utilized to evaluate flow at rest and with distal augmentation maneuvers in the common femoral, femoral and popliteal veins. COMPARISON:  None. FINDINGS: RIGHT LOWER EXTREMITY Common Femoral Vein: No evidence of thrombus. Normal compressibility, respiratory phasicity and response to augmentation. Saphenofemoral Junction: No evidence of thrombus. Normal compressibility and flow on color Doppler imaging. Profunda Femoral Vein: No evidence of thrombus. Normal compressibility and flow on color Doppler imaging.  Femoral Vein: No evidence of thrombus. Normal compressibility, respiratory phasicity and response to augmentation. Popliteal Vein: No evidence of thrombus. Normal compressibility, respiratory phasicity and response to augmentation. Calf Veins: Thrombus noted within posterior tibial and peroneal calf veins. These are occluded and noncompressible. Superficial Great Saphenous Vein: No evidence of thrombus. Normal compressibility and flow on color Doppler imaging. Venous Reflux:  None. Other Findings:  None. LEFT LOWER EXTREMITY Common Femoral Vein: No evidence of thrombus. Normal compressibility, respiratory phasicity and response to augmentation. Saphenofemoral Junction: No evidence of thrombus. Normal compressibility and flow on color Doppler imaging. Profunda Femoral Vein: No evidence of thrombus. Normal compressibility and flow on color Doppler imaging. Femoral Vein: No evidence of thrombus. Normal compressibility, respiratory phasicity and response to augmentation. Popliteal Vein: No evidence of thrombus. Normal compressibility, respiratory phasicity and response to augmentation. Calf Veins: No evidence of thrombus. Normal compressibility and flow on color Doppler imaging. Superficial Great Saphenous Vein: No evidence of thrombus. Normal compressibility and flow on color Doppler imaging. Venous Reflux:  None. Other Findings:  None. IMPRESSION: Occlusive thrombus within the right calf posterior tibial and peroneal veins. Electronically Signed   By: Rolm Baptise M.D.   On: 06/18/2017 11:38    Subjective:  This morning, he was feeling Luis Stewart of breath. This afternoon, he feels much better and was able to ambulate around the halls of the ICU without significant dyspnea. He continues to have some intermittent chest and back pains that are improved with Percocet. He denies any unusual bruising or bleeding. He does not feel that his right lower extremity has become any more swollen than it was immediately after  surgery.  Discharge Exam: Vitals:   06/18/17 1300 06/18/17 1434  BP: 112/63   Pulse: 81 98  Resp: (!) 32   Temp:     Vitals:   06/18/17 1111 06/18/17 1200 06/18/17 1300 06/18/17 1434  BP:  118/64 112/63   Pulse: 76 80 81 98  Resp: (!) 22 (!) 21 (!) 32   Temp: 98.2 F (36.8 C)     TempSrc: Oral     SpO2: 99% 98% 96% 100%  Weight:      Height:        General: Pt is alert, awake, not in acute distress Cardiovascular: RRR, S1/S2 +, no rubs, no gallops Respiratory: CTA bilaterally, no wheezing, no rhonchi Abdominal: Soft, NT, ND, bowel sounds + Extremities: Right lower extremity with 1+ pitting edema from the thigh to the foot. He has thigh high TED hose in place but his incision is visible through his TED hose along the anterior right knee running craniocaudal. No erythema, edges appear to be well approximated. No palpable cords in the calf or medial thigh. No significant calf pain. He has a focal area of tenderness just behind the right knee but does not extend proximally    The results of significant diagnostics from this hospitalization (including imaging, microbiology, ancillary and laboratory) are listed below  for reference.     Microbiology: No results found for this or any previous visit (from the past 240 hour(s)).   Labs: BNP (last 3 results) No results for input(s): BNP in the last 8760 hours. Basic Metabolic Panel:  Recent Labs Lab 06/12/17 0554 06/17/17 2227 06/18/17 0629  NA 141 137 138  K 4.0 4.4 4.4  CL 107 100* 103  CO2 28 28 28   GLUCOSE 122* 133* 110*  BUN 13 17 16   CREATININE 1.17 1.16 1.10  CALCIUM 8.8* 9.1 8.9   Liver Function Tests:  Recent Labs Lab 06/18/17 0629  AST 19  ALT 22  ALKPHOS 53  BILITOT 1.3*  PROT 6.9  ALBUMIN 3.2*   No results for input(s): LIPASE, AMYLASE in the last 168 hours. No results for input(s): AMMONIA in the last 168 hours. CBC:  Recent Labs Lab 06/12/17 0554 06/17/17 2227 06/18/17 0629  WBC 9.5 9.6  8.0  HGB 11.8* 11.9* 11.2*  HCT 34.8* 34.8* 32.5*  MCV 88.5 88.1 88.3  PLT 128* 234 236   Cardiac Enzymes:  Recent Labs Lab 06/17/17 2227  TROPONINI <0.03   BNP: Invalid input(s): POCBNP CBG: No results for input(s): GLUCAP in the last 168 hours. D-Dimer  Recent Labs  06/17/17 2227  DDIMER 9.18*   Hgb A1c No results for input(s): HGBA1C in the last 72 hours. Lipid Profile No results for input(s): CHOL, HDL, LDLCALC, TRIG, CHOLHDL, LDLDIRECT in the last 72 hours. Thyroid function studies No results for input(s): TSH, T4TOTAL, T3FREE, THYROIDAB in the last 72 hours.  Invalid input(s): FREET3 Anemia work up No results for input(s): VITAMINB12, FOLATE, FERRITIN, TIBC, IRON, RETICCTPCT in the last 72 hours. Urinalysis    Component Value Date/Time   COLORURINE YELLOW 06/17/2017 2352   APPEARANCEUR CLEAR 06/17/2017 2352   LABSPEC 1.013 06/17/2017 2352   PHURINE 5.0 06/17/2017 2352   GLUCOSEU NEGATIVE 06/17/2017 2352   HGBUR NEGATIVE 06/17/2017 2352   BILIRUBINUR NEGATIVE 06/17/2017 2352   KETONESUR NEGATIVE 06/17/2017 2352   PROTEINUR NEGATIVE 06/17/2017 2352   NITRITE NEGATIVE 06/17/2017 2352   LEUKOCYTESUR NEGATIVE 06/17/2017 2352   Sepsis Labs Invalid input(s): PROCALCITONIN,  WBC,  LACTICIDVEN   Time coordinating discharge: Over 30 minutes  SIGNED:   Janece Canterbury, MD  [facility] 06/18/2017, 3:16 PM Pager   If 7PM-7AM, please contact night-coverage [email or web address] Password [email or web address]

## 2017-06-18 NOTE — H&P (Signed)
TRH H&P    Patient Demographics:    Luis Stewart, is a 66 y.o. male  MRN: [id number]  DOB - 29-Apr-1951  Admit Date - 06/17/2017  Referring MD/NP/PA:   Outpatient Primary MD for the patient is [facility], Modena Nunnery, MD  Patient coming from: Home  Chief Complaint  Patient presents with  . Chest Pain      HPI:    Luis Stewart  is a 66 y.o. male, With history of osteoarthritis status post total right knee arthroplasty one week ago, came to hospital with gradually worsening right-sided sharp pleuritic chest pain which started this morning, patient says that he called orthopedic surgeon and told him that the pain could be from constipation. Patient says that he had 2 bowel movements and the pain did not go away so he again called the orthopedics office and was not told to go to the ED for further evaluation. Pain is constant and worse with deep breathing. He denies coughing. Patient was discharged on aspirin 325 mg daily after the surgery.  He denies shortness of breath. No nausea vomiting or diarrhea. He denies fever, no dysuria. No abdominal pain.   Review of systems:     All other systems reviewed and are negative.   With Past History of the following :    Past Medical History:  Diagnosis Date  . Arthritis   . Asbestosis ([place])   . Cyst of kidney, acquired   . Headache   . History of fracture of leg    Right, childhood  . Hx of migraine headaches   . Hyperlipidemia   . Neuropathy   . Prostate cancer ([place]) 2015   Adenocarcinoma by biopsy      Past Surgical History:  Procedure Laterality Date  . BIOPSY PROSTATE  2003 and 2005  . COLONOSCOPY  11/28/2008   Dr. Rourk:internal hemorrhoids/tortus colon/ascending colon polyps, tubular adenomas  . COLONOSCOPY N/A 09/04/2014   Procedure: COLONOSCOPY;  Surgeon: Daneil Dolin, MD;  [facility];  Service: Endoscopy;  Laterality: N/A;   9:30  . TOTAL KNEE ARTHROPLASTY Right 06/10/2017  . TOTAL KNEE ARTHROPLASTY Right 06/10/2017   Procedure: TOTAL KNEE ARTHROPLASTY;  Surgeon: [name], MD;  Location: [place];  Service: Orthopedics;  Laterality: Right;      Social History:      Social History  Substance Use Topics  . Smoking status: Never Smoker  . Smokeless tobacco: Never Used  . Alcohol use No       Family History :     Family History  Problem Relation Age of Onset  . Diabetes Mother   . Hypertension Mother   . Obesity Mother   . Heart disease Mother   . Mental illness Mother   . Hypertension Sister   . Obesity Sister   . Arthritis Sister   . Deep vein thrombosis Father   . Dementia Father   . Heart disease Father        CABG  . Colon cancer Neg Hx  Home Medications:   Prior to Admission medications   Medication Sig Start Date End Date Taking? Authorizing Provider  aspirin EC 325 MG tablet Take 1 tablet (325 mg total) by mouth daily. 1 tab a day for the next 30 days to prevent blood clots 06/10/17   Aundra Dubin, PA-C  Cholecalciferol (VITAMIN D) 2000 units CAPS Take 2,000 Units by mouth 3 (three) times a week.     [provider]  Multiple Vitamin (MULTIVITAMIN WITH MINERALS) TABS tablet Take 1 tablet by mouth daily. Centrum Silver for Men    [provider]  ondansetron (ZOFRAN) 4 MG tablet Take 1 tablet (4 mg total) by mouth every 8 (eight) hours as needed for nausea or vomiting. 06/10/17   Aundra Dubin, PA-C  oxyCODONE-acetaminophen (ROXICET) 5-325 MG tablet Take 1-2 tablets by mouth every 4 (four) hours as needed. 06/10/17   Aundra Dubin, PA-C     Allergies:     Allergies  Allergen Reactions  . Sulfa Antibiotics Other (See Comments)    Unknown reaction. Childhood reaction     Physical Exam:   Vitals  Blood pressure 117/74, pulse 75, temperature 98.8 F (37.1 C), temperature source Oral, resp. rate (!) 24, height 6\' 2"  (1.88 m), weight 79.8 kg  (176 lb), SpO2 98 %.  1.  General: Appears in no acute distress  2. Psychiatric:  Intact judgement and  insight, awake alert, oriented x 3.  3. Neurologic: No focal neurological deficits, all cranial nerves intact.Strength 5/5 all 4 extremities, sensation intact all 4 extremities, plantars down going.  4. Eyes :  anicteric sclerae, moist conjunctivae with no lid lag. PERRLA.  5. ENMT:  Oropharynx clear with moist mucous membranes and good dentition  6. Neck:  supple, no cervical lymphadenopathy appriciated, No thyromegaly  7. Respiratory : Normal respiratory effort, good air movement bilaterally,clear to  auscultation bilaterally  8. Cardiovascular : RRR, no gallops, rubs or murmurs, no leg edema  9. Gastrointestinal:  Positive bowel sounds, abdomen soft, non-tender to palpation,no hepatosplenomegaly, no rigidity or guarding       10. Skin:  No cyanosis, normal texture and turgor, no rash, lesions or ulcers  11.Musculoskeletal:  Right lower extremity is edematous, warm to touch.    Data Review:    CBC  Recent Labs Lab 06/11/17 0529 06/12/17 0554 06/17/17 2227  WBC 6.9 9.5 9.6  HGB 11.5* 11.8* 11.9*  HCT 34.3* 34.8* 34.8*  PLT 133* 128* 234  MCV 89.3 88.5 88.1  MCH 29.9 30.0 30.1  MCHC 33.5 33.9 34.2  RDW 14.2 14.0 14.0   ------------------------------------------------------------------------------------------------------------------  Chemistries   Recent Labs Lab 06/11/17 0529 06/12/17 0554 06/17/17 2227  NA 139 141 137  K 3.9 4.0 4.4  CL 107 107 100*  CO2 28 28 28   GLUCOSE 151* 122* 133*  BUN 10 13 17   CREATININE 1.18 1.17 1.16  CALCIUM 8.4* 8.8* 9.1   ------------------------------------------------------------------------------------------------------------------  ------------------------------------------------------------------------------------------------------------------ GFR: Estimated Creatinine Clearance: 70.7 mL/min (by C-G  formula based on SCr of 1.16 mg/dL). Liver Function Tests: No results for input(s): AST, ALT, ALKPHOS, BILITOT, PROT, ALBUMIN in the last 168 hours. No results for input(s): LIPASE, AMYLASE in the last 168 hours. No results for input(s): AMMONIA in the last 168 hours. Coagulation Profile:  Recent Labs Lab 06/17/17 2227  INR 1.19   Cardiac Enzymes:  Recent Labs Lab 06/17/17 2227  TROPONINI <0.03   BNP (last 3 results) No results for input(s): PROBNP in the last 8760 hours.  HbA1C: No results for input(s): HGBA1C in the last 72 hours. CBG: No results for input(s): GLUCAP in the last 168 hours. Lipid Profile: No results for input(s): CHOL, HDL, LDLCALC, TRIG, CHOLHDL, LDLDIRECT in the last 72 hours. Thyroid Function Tests: No results for input(s): TSH, T4TOTAL, FREET4, T3FREE, THYROIDAB in the last 72 hours. Anemia Panel: No results for input(s): VITAMINB12, FOLATE, FERRITIN, TIBC, IRON, RETICCTPCT in the last 72 hours.  --------------------------------------------------------------------------------------------------------------- Urine analysis:    Component Value Date/Time   COLORURINE YELLOW 06/17/2017 2352   APPEARANCEUR CLEAR 06/17/2017 2352   LABSPEC 1.013 06/17/2017 2352   PHURINE 5.0 06/17/2017 2352   GLUCOSEU NEGATIVE 06/17/2017 2352   HGBUR NEGATIVE 06/17/2017 2352   BILIRUBINUR NEGATIVE 06/17/2017 2352   KETONESUR NEGATIVE 06/17/2017 2352   PROTEINUR NEGATIVE 06/17/2017 2352   NITRITE NEGATIVE 06/17/2017 2352   LEUKOCYTESUR NEGATIVE 06/17/2017 2352      Imaging Results:    Ct Angio Chest Pe W And/or Wo Contrast  Result Date: 06/18/2017 CLINICAL DATA:  66 y/o M; chest pain shortness of breath. Back pain. Recent knee surgery. EXAM: CT ANGIOGRAPHY CHEST CT ABDOMEN AND PELVIS WITH CONTRAST TECHNIQUE: Multidetector CT imaging of the chest was performed using the standard protocol during bolus administration of intravenous contrast. Multiplanar CT image  reconstructions and MIPs were obtained to evaluate the vascular anatomy. Multidetector CT imaging of the abdomen and pelvis was performed using the standard protocol during bolus administration of intravenous contrast. CONTRAST:  100 cc Isovue 370 COMPARISON:  05/14/2017 CT abdomen and pelvis. 09/27/2015 CT of the chest. FINDINGS: CTA CHEST FINDINGS Cardiovascular: Multiple segmental pulmonary emboli in the right lung. RV LV ratio equals 0.8. Normal size heart. No pericardial effusion. Normal caliber thoracic aorta. Mediastinum/Nodes: No enlarged mediastinal, hilar, or axillary lymph nodes. Thyroid gland, trachea, and esophagus demonstrate no significant findings. Lungs/Pleura: Lungs are clear. No pleural effusion or pneumothorax. Musculoskeletal: No chest wall abnormality. No acute or significant osseous findings. Review of the MIP images confirms the above findings. CT ABDOMEN and PELVIS FINDINGS Hepatobiliary: Numerous liver cysts are grossly stable. No new focal liver lesion identified. Normal gallbladder. No intra or extrahepatic biliary ductal dilatation. Pancreas: Unremarkable. No pancreatic ductal dilatation or surrounding inflammatory changes. Spleen: Normal in size without focal abnormality. Adrenals/Urinary Tract: Normal adrenal glands. Multiple renal cysts the largest in the right lower pole measuring up to 12 mm. No hydronephrosis or urinary stone disease identified. Normal bladder. Stomach/Bowel: Stomach is within normal limits. Appendix appears normal. No evidence of bowel wall thickening, distention, or inflammatory changes. Vascular/Lymphatic: Aortic atherosclerosis. No enlarged abdominal or pelvic lymph nodes. Reproductive: Massively enlarged prostate gland measuring 7.7 x 8.4 x 11.5 cm (volume = 390 cm^3). Other: No abdominal wall hernia or abnormality. No abdominopelvic ascites. Musculoskeletal: Sacral Paget's disease. No acute osseous abnormality is evident. Review of the MIP images confirms the  above findings. IMPRESSION: 1. Multiple segmental acute pulmonary emboli in the right lung. No CT evidence for right heart strain. 2. Multiple liver and renal cysts. 3. Massively enlarged prostate gland, volume approximately 390 cc. 4. Stable findings of sacral Paget's disease. These results were called by telephone at the time of interpretation on 06/18/2017 at 12:54 am to Dr. Ezequiel Essex , who verbally acknowledged these results. Electronically Signed   By: Kristine Garbe M.D.   On: 06/18/2017 00:56   Ct Abdomen Pelvis W Contrast  Result Date: 06/18/2017 CLINICAL DATA:  66 y/o M; chest pain shortness of breath. Back pain. Recent knee surgery. EXAM: CT  ANGIOGRAPHY CHEST CT ABDOMEN AND PELVIS WITH CONTRAST TECHNIQUE: Multidetector CT imaging of the chest was performed using the standard protocol during bolus administration of intravenous contrast. Multiplanar CT image reconstructions and MIPs were obtained to evaluate the vascular anatomy. Multidetector CT imaging of the abdomen and pelvis was performed using the standard protocol during bolus administration of intravenous contrast. CONTRAST:  100 cc Isovue 370 COMPARISON:  05/14/2017 CT abdomen and pelvis. 09/27/2015 CT of the chest. FINDINGS: CTA CHEST FINDINGS Cardiovascular: Multiple segmental pulmonary emboli in the right lung. RV LV ratio equals 0.8. Normal size heart. No pericardial effusion. Normal caliber thoracic aorta. Mediastinum/Nodes: No enlarged mediastinal, hilar, or axillary lymph nodes. Thyroid gland, trachea, and esophagus demonstrate no significant findings. Lungs/Pleura: Lungs are clear. No pleural effusion or pneumothorax. Musculoskeletal: No chest wall abnormality. No acute or significant osseous findings. Review of the MIP images confirms the above findings. CT ABDOMEN and PELVIS FINDINGS Hepatobiliary: Numerous liver cysts are grossly stable. No new focal liver lesion identified. Normal gallbladder. No intra or extrahepatic  biliary ductal dilatation. Pancreas: Unremarkable. No pancreatic ductal dilatation or surrounding inflammatory changes. Spleen: Normal in size without focal abnormality. Adrenals/Urinary Tract: Normal adrenal glands. Multiple renal cysts the largest in the right lower pole measuring up to 12 mm. No hydronephrosis or urinary stone disease identified. Normal bladder. Stomach/Bowel: Stomach is within normal limits. Appendix appears normal. No evidence of bowel wall thickening, distention, or inflammatory changes. Vascular/Lymphatic: Aortic atherosclerosis. No enlarged abdominal or pelvic lymph nodes. Reproductive: Massively enlarged prostate gland measuring 7.7 x 8.4 x 11.5 cm (volume = 390 cm^3). Other: No abdominal wall hernia or abnormality. No abdominopelvic ascites. Musculoskeletal: Sacral Paget's disease. No acute osseous abnormality is evident. Review of the MIP images confirms the above findings. IMPRESSION: 1. Multiple segmental acute pulmonary emboli in the right lung. No CT evidence for right heart strain. 2. Multiple liver and renal cysts. 3. Massively enlarged prostate gland, volume approximately 390 cc. 4. Stable findings of sacral Paget's disease. These results were called by telephone at the time of interpretation on 06/18/2017 at 12:54 am to Dr. Ezequiel Essex , who verbally acknowledged these results. Electronically Signed   By: Kristine Garbe M.D.   On: 06/18/2017 00:56    My personal review of EKG: Rhythm NSR, LVH   Assessment & Plan:    Active Problems:   Pulmonary embolism ([place])   1. Pulmonary embolism- CT chest shows no evidence for right heart strain. She was multiple segmental acute pulmonary emboli in right lung. Patient started on IV heparin per pharmacy consultation. Consider starting NOAC at the time of discharge. 2. Status post right total knee arthroplasty- stable    DVT Prophylaxis-   Heparin  AM Labs Ordered, also please review Full Orders  Family  Communication: Admission, patients condition and plan of care including tests being ordered have been discussed with the patient and his wife at bedside* who indicate understanding and agree with the plan and Code Status.  Code Status:  Full code  Admission status: Observation    Time spent in minutes : 50 minutes   Raydon Chappuis S M.D on 06/18/2017 at 3:54 AM  Between 7am to 7pm - Pager - [phone]. After 7pm go to [email or web address] - password [email or web address]  [facility] - Office  [phone]

## 2017-06-18 NOTE — Progress Notes (Signed)
Patient alert and oriented. Vital signs are stable. Saline lock removed. Discharge instructions given. Prescriptions given. Patient verbalized understanding of instructions. Patient to leave floor via wheelchair accompanied with nursing staff and family members.

## 2017-06-18 NOTE — ED Notes (Signed)
Pt and family updated on delay of admission, expressed understanding,

## 2017-06-18 NOTE — Progress Notes (Signed)
ANTICOAGULATION CONSULT NOTE - Preliminary  Pharmacy Consult for Heparin Indication: Pulmonary embolism  Allergies  Allergen Reactions  . Sulfa Antibiotics Other (See Comments)    Unknown reaction. Childhood reaction    Patient Measurements: Height: 6\' 2"  (188 cm) Weight: 176 lb (79.8 kg) IBW/kg (Calculated) : 82.2 HEPARIN DW (KG): 79.8   Vital Signs: Temp: 98.8 F (37.1 C) (06/27 2349) Temp Source: Oral (06/27 2349) BP: 123/99 (06/28 0100) Pulse Rate: 81 (06/28 0100)  Labs:  Recent Labs  06/17/17 2227  HGB 11.9*  HCT 34.8*  PLT 234  CREATININE 1.16  TROPONINI <0.03   Estimated Creatinine Clearance: 70.7 mL/min (by C-G formula based on SCr of 1.16 mg/dL).  Medical History: Past Medical History:  Diagnosis Date  . Arthritis   . Asbestosis ([place])   . Cyst of kidney, acquired   . Headache   . History of fracture of leg    Right, childhood  . Hx of migraine headaches   . Hyperlipidemia   . Neuropathy   . 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 Pt is s/p TKA 06/10/17 without problems. Chest CT shows multiple pulmonary emboli. Pt to be given heparin infusion per pharmacy dosing.  Goal of Therapy:  Heparin level goal: 0.3-0.7 units/ml Monitor platelets by anticoagulation protocol: Yes   Plan:  Heparin bolus 4000 units; Heparin infusion: 1400 units per hour Preliminary review of pertinent patient information completed.  Forestine Na clinical pharmacist will complete review during morning rounds to assess the patient and finalize treatment regimen.  Norberto Sorenson, [facility] 06/18/2017,1:28 AM

## 2017-06-18 NOTE — ED Notes (Signed)
Pt and family updated on plan of care,  

## 2017-06-18 NOTE — Progress Notes (Signed)
ANTICOAGULATION CONSULT NOTE - Follow Up Consult  Pharmacy Consult for xarelto Indication: pulmonary embolus  Allergies  Allergen Reactions  . Sulfa Antibiotics Other (See Comments)    Unknown reaction. Childhood reaction    Patient Measurements: Height: 6\' 2"  (188 cm) Weight: 176 lb (79.8 kg) IBW/kg (Calculated) : 82.2   Vital Signs: Temp: 98 F (36.7 C) (06/28 0748) Temp Source: Oral (06/28 0748) BP: 118/62 (06/28 0617) Pulse Rate: 70 (06/28 0748)  Labs:  Recent Labs  06/17/17 2227 06/18/17 0629  HGB 11.9* 11.2*  HCT 34.8* 32.5*  PLT 234 236  APTT 36  --   LABPROT 15.2  --   INR 1.19  --   CREATININE 1.16 1.10  TROPONINI <0.03  --     Estimated Creatinine Clearance: 74.6 mL/min (by C-G formula based on SCr of 1.1 mg/dL).   Medications:  Prescriptions Prior to Admission  Medication Sig Dispense Refill Last Dose  . aspirin EC 325 MG tablet Take 1 tablet (325 mg total) by mouth daily. 1 tab a day for the next 30 days to prevent blood clots 30 tablet 0   . Cholecalciferol (VITAMIN D) 2000 units CAPS Take 2,000 Units by mouth 3 (three) times a week.    Past Month at Unknown time  . Multiple Vitamin (MULTIVITAMIN WITH MINERALS) TABS tablet Take 1 tablet by mouth daily. Centrum Silver for Men   Past Month at Unknown time  . ondansetron (ZOFRAN) 4 MG tablet Take 1 tablet (4 mg total) by mouth every 8 (eight) hours as needed for nausea or vomiting. 40 tablet 0   . oxyCODONE-acetaminophen (ROXICET) 5-325 MG tablet Take 1-2 tablets by mouth every 4 (four) hours as needed. 60 tablet 0     Assessment: 66 yo man started on heparin last night to change to xarelto for PE.  Baseline Hg low but no bleeding noted. Goal of Therapy:  therapeutic anticoagulation Monitor platelets by anticoagulation protocol: Yes   Plan:  Stop heparin now.  Start xarelto 15 mg PO bid for 21 days then 20 mg po daily. Monitor for bleeding complications  Aaden Buckman Poteet 06/18/2017,7:58 AM

## 2017-06-18 NOTE — Care Management Obs Status (Signed)
[place] NOTIFICATION   Patient Details  Name: Luis Stewart MRN: [id number] Date of Birth: 1951-07-28   Medicare Observation Status Notification Given:  Yes    Velma Agnes, [place], RN 06/18/2017, 1:47 PM

## 2017-06-18 NOTE — Care Management Note (Signed)
Case Management Note  Patient Details  Name: Luis Stewart MRN: [id number] Date of Birth: 1951/10/13  :   Expected Discharge Date:       06/18/2017           Expected Discharge Plan:  Home/Self Care  In-House Referral:     Discharge planning Services  CM Consult, Medication Assistance  Post Acute Care Choice:    Choice offered to:  NA  DME Arranged:    DME Agency:     HH Arranged:    [place] Agency:     Status of Service:  Completed, signed off  If discussed at [employer] of Stay Meetings, dates discussed:    Additional Comments: Patient discharging home today. Has one visit left with [facility] and then will transition to OP PT. New to Xarelto. Voucher given by pharmacy. No CM needs.   Martino Tompson, [place]uncey [place], RN 06/18/2017, 2:59 PM

## 2017-06-20 DIAGNOSIS — Z96651 Presence of right artificial knee joint: Secondary | ICD-10-CM | POA: Diagnosis not present

## 2017-06-20 DIAGNOSIS — Z471 Aftercare following joint replacement surgery: Secondary | ICD-10-CM | POA: Diagnosis not present

## 2017-06-20 DIAGNOSIS — Z77090 Contact with and (suspected) exposure to asbestos: Secondary | ICD-10-CM | POA: Diagnosis not present

## 2017-06-20 DIAGNOSIS — G629 Polyneuropathy, unspecified: Secondary | ICD-10-CM | POA: Diagnosis not present

## 2017-06-20 DIAGNOSIS — Z8546 Personal history of malignant neoplasm of prostate: Secondary | ICD-10-CM | POA: Diagnosis not present

## 2017-06-22 ENCOUNTER — Ambulatory Visit (HOSPITAL_COMMUNITY): Payer: Medicare Other | Attending: Orthopedic Surgery | Admitting: Physical Therapy

## 2017-06-22 ENCOUNTER — Encounter (HOSPITAL_COMMUNITY): Payer: Self-pay | Admitting: Physical Therapy

## 2017-06-22 DIAGNOSIS — R600 Localized edema: Secondary | ICD-10-CM | POA: Diagnosis not present

## 2017-06-22 DIAGNOSIS — R2681 Unsteadiness on feet: Secondary | ICD-10-CM | POA: Insufficient documentation

## 2017-06-22 DIAGNOSIS — M25661 Stiffness of right knee, not elsewhere classified: Secondary | ICD-10-CM

## 2017-06-22 DIAGNOSIS — R262 Difficulty in walking, not elsewhere classified: Secondary | ICD-10-CM

## 2017-06-22 NOTE — Patient Instructions (Signed)
   QUAD SET  Tighten your top thigh muscle as you attempt to press the back of your knee downward towards the table.  Hold for 3 seconds.  Repeat 15 times, at least 3 times per day.    KNEE FLEXION - SELF ASSISTED (YOU MAY USE YOUR OTHER LEG AS SHOWN ABOVE, OR A SHEET LIKE WE DID IN CLINIC)  Lying on your back with knees straight, slide the affected heel towards your buttock as you bend your knee. Use the unaffected leg to assist the bending.   Hold a gentle stretch in this position for 3 seconds and then return to original position.  Repeat 15 times, at least 3 times per day.     KNEE FLEXION STRETCH - SELF ASSISTED  While seated in a chair, use your unaffected leg to bend your affected knee until a stretch is felt.  Hold for 10 seconds.  Repeat 10-15 times, at least 3 times per day.    WEIGHT SHIFT - LATERAL (DO AT KITCHEN COUNTER)  While in a standing position and knees partially bent, slowly shift your body weight side-to-side.  Put as much weight down through your right leg as you can tolerate.  Repeat 10-15 times, 2-3 times per day.

## 2017-06-22 NOTE — Therapy (Signed)
[place], [place], [address] Phone: [phone]   Fax:  [phone]  Physical Therapy Evaluation  Patient Details  Name: Luis Stewart MRN: [id number] Date of Birth: 10-23-1951 Referring Provider: Kathryne Hitch   Encounter Date: 06/22/2017      PT End of Session - 06/22/17 1038    Visit Number 1   Number of Visits 19   Date for PT Re-Evaluation 07/13/17   Authorization Type Medicare primary, BCBS supplementary    Authorization Time Period 06/22/17 to 08/03/17   Authorization - Visit Number 1   Authorization - Number of Visits 10   PT Start Time 0948   PT Stop Time 1030   PT Time Calculation (min) 42 min   Activity Tolerance Patient tolerated treatment well   Behavior During Therapy [facility] for tasks assessed/performed      Past Medical History:  Diagnosis Date  . Arthritis   . Asbestosis ([place])   . Cyst of kidney, acquired   . Headache   . History of fracture of leg    Right, childhood  . Hx of migraine headaches   . Hyperlipidemia   . Neuropathy   . Prostate cancer ([place]) 2015   Adenocarcinoma by biopsy    Past Surgical History:  Procedure Laterality Date  . BIOPSY PROSTATE  2003 and 2005  . COLONOSCOPY  11/28/2008   Dr. Rourk:internal hemorrhoids/tortus colon/ascending colon polyps, tubular adenomas  . COLONOSCOPY N/A 09/04/2014   Procedure: COLONOSCOPY;  Surgeon: Daneil Dolin, MD;  [facility];  Service: Endoscopy;  Laterality: N/A;  9:30  . TOTAL KNEE ARTHROPLASTY Right 06/10/2017  . TOTAL KNEE ARTHROPLASTY Right 06/10/2017   Procedure: TOTAL KNEE ARTHROPLASTY;  Surgeon: [name], MD;  Location: [place];  Service: Orthopedics;  Laterality: Right;    There were no vitals filed for this visit.       Subjective Assessment - 06/22/17 0949    Subjective Patient had his R knee replaced on 06/10/17; he then started feeling some back pain on 6/27 that got worse through the day  as well as constipation. He was given a laxative, but his pain continued and he called the MD back, and was told to go to the hospital. He was diagnosed with a pulmonary embolism and was put on Xarelto. He received HHPT but has since been discharged. He reports that balance is his biggest concern, his knee has also been feeling tight and he continues to use the CPM, he has gotten to as much as 95 degrees in the CPM.    Pertinent History xarelto, history of PE, history of prostate cancer (just monitoring now), neuropathy    How long can you sit comfortably? 7/2- fairly well, has not noticed major limitations    How long can you stand comfortably? 7/2- long enough to do self-care   How long can you walk comfortably? 7/2- just ROM, not otherwise limited    Patient Stated Goals get back to normal ASAP    Currently in Pain? Yes   Pain Score 2    Pain Location Knee   Pain Orientation Right;Medial   Pain Descriptors / Indicators Aching   Pain Type Surgical pain   Pain Radiating Towards none    Pain Onset 1 to 4 weeks ago   Pain Frequency Constant   Aggravating Factors  nothing    Pain Relieving Factors elevation, ice    Effect of Pain on Daily Activities moderate  impact             [facility] PT Assessment - 06/22/17 0001      Assessment   Medical Diagnosis R TKR    Referring Provider Kathryne Hitch    Onset Date/Surgical Date 06/10/17   Next MD Visit tomorrow with Dr. Percell Miller    Prior Therapy HHPT for TKR, has been [facility]      Precautions   Precautions Fall;Other (comment)   Precaution Comments active xarelto      Balance Screen   Has the patient fallen in the past 6 months No   Has the patient had a decrease in activity level because of a fear of falling?  No   Is the patient reluctant to leave their home because of a fear of falling?  No     Prior Function   Level of Independence Independent;Independent with basic ADLs;Independent with gait;Independent with transfers   Vocation Retired    Leisure bowling      Observation/Other Assessments   Observations no signs of infection or inflammation noted today      AROM   Right Knee Extension 5   Right Knee Flexion 72  seated; reports HHPT got it to 93 sitting      Strength   Right Hip Flexion 4+/5   Right Hip Extension 4/5   Right Hip ABduction 4+/5   Left Hip Flexion 4+/5   Left Hip Extension 3+/5   Left Hip ABduction 5/5   Right Knee Flexion 4-/5  ROM limited    Right Knee Extension 5/5   Left Knee Flexion 5/5   Left Knee Extension 5/5   Right Ankle Dorsiflexion 5/5   Left Ankle Dorsiflexion 5/5     Ambulation/Gait   Gait Comments reduced ROM R knee, reduced gait speed, mild antalgic pattern      6 minute walk test results    Aerobic Endurance Distance Walked 313   Endurance additional comments 3MWT, SPC      High Level Balance   High Level Balance Comments TUG 14 SPC             Objective measurements completed on examination: See above findings.          [facility] [facility] Adult PT Treatment/Exercise - 06/22/17 0001      Exercises   Exercises Knee/Hip     Knee/Hip Exercises: Standing   Other Standing Knee Exercises lateral weight shfits at mat table, 1x10      Knee/Hip Exercises: Seated   Other Seated Knee/Hip Exercises seated knee flexion stretch 1x5, 10  second holds      Knee/Hip Exercises: Supine   Quad Sets Right;1 set;10 reps   Quad Sets Limitations 3 second holds    Heel Slides Right;1 set;10 reps   Heel Slides Limitations 3 second holds                 PT Education - 06/22/17 1038    Education provided Yes   Education Details prognosis, exam findings, POC, HEP    Person(s) Educated Patient   Methods Explanation;Demonstration;Handout   Comprehension Verbalized understanding;Returned demonstration;Need further instruction          PT Short Term Goals - 06/22/17 1042      PT SHORT TERM GOAL #1   Title Patient to demonstrate R knee ROM as being 0-115 degrees in order to  improve mechanics and assist in return to PLOF    Time 3   Period Weeks   Status New  PT SHORT TERM GOAL #2   Title Patient to be ambulating with no assistive device, consistent heel toe pattern, no unsteadiness to improve mobiilty and assist in return to PLOF    Time 3   Period Weeks   Status New     PT SHORT TERM GOAL #3   Title patient to be independent in correct performance of scar mobilization, patella mobility, and edema control in order to improve self-efficacy in managing condition    Time 3   Period Weeks   Status New     PT SHORT TERM GOAL #4   Title Patient to be compliant with correct performance of HEP, to be updated PRN    Time 1   Period Weeks   Status New           PT Long Term Goals - 06/22/17 1044      PT LONG TERM GOAL #1   Title Patient to demonstrate functional strength as being 5/5 in all tested muscle groups in order to improve giat and balance    Time 6   Period Weeks   Status New     PT LONG TERM GOAL #2   Title Patient to be able to complete TUG test in 10 seconds with no AD in order to show improved balance/reduced fall risk    Time 6   Period Weeks   Status New     PT LONG TERM GOAL #3   Title Patient to be able to perform mock-bowling exercise in clinic with no LOB and good form in order to ensure safety and success with individual attempt to return to bowling based tasks    Time 6   Period Weeks   Status New     PT LONG TERM GOAL #4   Title Patient to be able to ascend/descent full flight of stairs with U railing, no AD, reciprocal pattern, good eccentric control in order to show improved community and home access    Time 6   Period Weeks   Status New                Plan - 06/22/17 1039    Clinical Impression Statement Patient arrives approximately 2 weeks after R TKR surgery which was performed on 06/10/17; he did experience a pulmonary embolism post-surgically however has been treated for this and is now on Xarelto to  prevent further complications. He has been discharged from [facility] services. Examination reveals typical appearance following TKR, including limited R knee ROM, localized edema, functional muscle weakness, gait impairment, and reduced ability to tolerate PLOF based activities. He will benefit from skilled PT services to address functional deficits, reduce fall risk, and to assist in return to optimal level of function.    History and Personal Factors relevant to plan of care: PE after surgery (now medically controlled), high PLOF/minimal PMH    Clinical Presentation Stable   Clinical Presentation due to: post-surgical state    Clinical Decision Making Low   Rehab Potential Good   Clinical Impairments Affecting Rehab Potential (+) motivated to participate in PT/return to PLOF, minimal PMH   PT Frequency 3x / week   PT Duration 6 weeks   PT Treatment/Interventions ADLs/Self Care Home Management;Biofeedback;Cryotherapy;DME Instruction;Gait training;Stair training;Functional mobility training;Therapeutic activities;Therapeutic exercise;Balance training;Neuromuscular re-education;Patient/family education;Manual techniques;Scar mobilization;Passive range of motion;Dry needling   PT Next Visit Plan review initial eval/goals, HEP; focus on edema control and manual for flexion primarily; ROM based exercises such as TKEs. Balance and gait training  PT Home Exercise Plan Eval: quad sets, heel slides, lateral weight shfits, seated knee flexion stretch    Consulted and Agree with Plan of Care Patient      Patient will benefit from skilled therapeutic intervention in order to improve the following deficits and impairments:  Abnormal gait, Decreased skin integrity, Pain, Decreased mobility, Decreased scar mobility, Decreased activity tolerance, Decreased range of motion, Decreased strength, Hypomobility, Decreased balance, Difficulty walking, Increased edema, Impaired flexibility  Visit Diagnosis: Stiffness of  right knee, not elsewhere classified - Plan: PT plan of care cert/re-cert  Localized edema - Plan: PT plan of care cert/re-cert  Difficulty in walking, not elsewhere classified - Plan: PT plan of care cert/re-cert  Unsteadiness on feet - Plan: PT plan of care cert/re-cert      G-Codes - 25/05/39 1155    Functional Assessment Tool Used (Outpatient Only) Baed on skilled clinical assessment of ROM, strength, gait, balance    Functional Limitation Mobility: Walking and moving around   Mobility: Walking and Moving Around Current Status (J6734) At least 40 percent but less than 60 percent impaired, limited or restricted   Mobility: Walking and Moving Around Goal Status (L9379) At least 20 percent but less than 40 percent impaired, limited or restricted       Problem List Patient Active Problem List   Diagnosis Date Noted  . Pulmonary embolism ([place]) 06/18/2017  . Primary localized osteoarthritis of right knee 06/10/2017  . Upper airway cough syndrome 08/29/2015  . Prostate cancer ([place]) 08/17/2015  . Exposure to asbestos 08/17/2015  . Personal history of colonic polyps 08/08/2014  . Joint pain 06/05/2014  . Vitamin D deficiency 06/05/2014  . BPH (benign prostatic hypertrophy) 06/01/2013  . Mild hyperlipidemia 06/01/2013  . Routine general medical examination at a health care facility 06/01/2013    Deniece Ree PT, DPT [facility] [address] [place], [place], [address] Phone: [phone]   Fax:  [phone]  Name: Luis Stewart MRN: [id number] Date of Birth: July 19, 1951

## 2017-06-23 DIAGNOSIS — M1711 Unilateral primary osteoarthritis, right knee: Secondary | ICD-10-CM | POA: Diagnosis not present

## 2017-06-25 ENCOUNTER — Ambulatory Visit (HOSPITAL_COMMUNITY): Payer: Medicare Other | Admitting: Physical Therapy

## 2017-06-25 ENCOUNTER — Encounter: Payer: Self-pay | Admitting: Family Medicine

## 2017-06-25 ENCOUNTER — Ambulatory Visit (INDEPENDENT_AMBULATORY_CARE_PROVIDER_SITE_OTHER): Payer: Medicare Other | Admitting: Family Medicine

## 2017-06-25 VITALS — BP 122/70 | HR 78 | Temp 98.4°F | Resp 14 | Ht 74.0 in | Wt 173.0 lb

## 2017-06-25 DIAGNOSIS — D649 Anemia, unspecified: Secondary | ICD-10-CM | POA: Diagnosis not present

## 2017-06-25 DIAGNOSIS — M25661 Stiffness of right knee, not elsewhere classified: Secondary | ICD-10-CM | POA: Diagnosis not present

## 2017-06-25 DIAGNOSIS — R2681 Unsteadiness on feet: Secondary | ICD-10-CM | POA: Diagnosis not present

## 2017-06-25 DIAGNOSIS — R262 Difficulty in walking, not elsewhere classified: Secondary | ICD-10-CM

## 2017-06-25 DIAGNOSIS — R600 Localized edema: Secondary | ICD-10-CM | POA: Diagnosis not present

## 2017-06-25 DIAGNOSIS — I2699 Other pulmonary embolism without acute cor pulmonale: Secondary | ICD-10-CM

## 2017-06-25 MED ORDER — RIVAROXABAN 20 MG PO TABS: 20.0000 mg | ORAL_TABLET | Freq: Every day | ORAL | 2 refills | Status: DC

## 2017-06-25 NOTE — Assessment & Plan Note (Addendum)
Acute PE precipiated by surgery/ lower extremeity DVT, plan to treat for 3-6 months, reassess in Sept. Complete starter pack of Xarelto then continue 20mg  daily Recheck CBC, mild post operative anemia and low albumin

## 2017-06-25 NOTE — Patient Instructions (Addendum)
We will call with lab results F/U Mid September

## 2017-06-25 NOTE — Progress Notes (Signed)
   Subjective:    Patient ID: Luis Stewart, male    DOB: May 30, 1951, 66 y.o.   MRN: [id number]  Patient presents for Hospital F/U (DVT/ pulmonary embolis) Patient here for hospital follow-up. He status post right knee arthroplasty on June 20 he presented to the emergency room about a week later with some pleuritic chest pain on the right side. CT of chest confirmed right segmental pulmonary emboli he also had ultrasound that showed right lower extremity DVT. He was started on Xarelto with plans to take for 3 months postoperatively for DVT PE and then reassess. Initially he was on heparin drip and transitioned to the Xarelto fairly quickly. He has resumed his physical therapy for his knee surgery. There is no evidence of any heart strain and troponins were negative his oxygen sats were also normal. He is here today for follow-up. He is need need for repeat CBC and metabolic MO since his hospitalization. His hemoglobin was 11.2 at discharge and his albumin on this metabolic panel was mildly low at 3.2 He is doing fairly well since hospitalization no shortness of breath he still gets some discomfort in his right lower side which is what he initially presented with. He has not had any blood in the urine or blood in his stools no significant cough. He does occasionally get some mild sinus drainage but no blood in the sinus drainage. He is using nasal saline His appetite is improving  Dr. Kathryne Hitch Surgeon  Review Of Systems:  GEN- denies fatigue, fever, weight loss,weakness, recent illness HEENT- denies eye drainage, change in vision, nasal discharge, CVS- denies chest pain, palpitations RESP- denies SOB, cough, wheeze ABD- denies N/V, change in stools, abd pain GU- denies dysuria, hematuria, dribbling, incontinence MSK- denies joint pain, muscle aches, injury Neuro- denies headache, dizziness, syncope, seizure activity       Objective:    BP 122/70   Pulse 78   Temp  98.4 F (36.9 C) (Oral)   Resp 14   Ht 6\' 2"  (1.88 m)   Wt 173 lb (78.5 kg)   SpO2 97%   BMI 22.21 kg/m  GEN- NAD, alert and oriented x3 HEENT- PERRL, EOMI, non injected sclera, pink conjunctiva, MMM, oropharynx clear Neck- Supple  CVS- RRR, no murmur RESP-CTAB ABD-NABS,soft,NT,ND EXT-  edema right knee and right lower leg, compression hose bilat, LLE no edema Pulses- Radial, DP- 2+        Assessment & Plan:      Problem List Items Addressed This Visit    Pulmonary embolism ([place]) - Primary    Acute PE precipiated by surgery/ lower extremeity DVT, plan to treat for 3-6 months, reassess in Sept. Complete starter pack of Xarelto then continue 20mg  daily Recheck CBC, mild post operative anemia and low albumin      Relevant Medications   rivaroxaban (XARELTO) 20 MG TABS tablet   Other Relevant Orders   CBC with Differential/Platelet   Comprehensive metabolic panel   Postoperative anemia      Note: This dictation was prepared with Dragon dictation along with smaller phrase technology. Any transcriptional errors that result from this process are unintentional.

## 2017-06-25 NOTE — Therapy (Signed)
[place], [place], [address] Phone: [phone]   Fax:  [phone]  Physical Therapy Treatment  Patient Details  Name: Luis Stewart MRN: [id number] Date of Birth: [birth year] Referring Provider: Kathryne Hitch   Encounter Date: 06/25/2017      PT End of Session - 06/25/17 0942    Visit Number 2   Number of Visits 19   Date for PT Re-Evaluation 07/13/17   Authorization Type Medicare primary, BCBS supplementary    Authorization Time Period 06/22/17 to 08/03/17   Authorization - Visit Number 2   Authorization - Number of Visits 10   PT Start Time 0902   PT Stop Time 0941   PT Time Calculation (min) 39 min   Activity Tolerance Patient tolerated treatment well   Behavior During Therapy [facility] for tasks assessed/performed      Past Medical History:  Diagnosis Date  . Arthritis   . Asbestosis ([place])   . Cyst of kidney, acquired   . Headache   . History of fracture of leg    Right, childhood  . Hx of migraine headaches   . Hyperlipidemia   . Neuropathy   . Prostate cancer ([place]) 2015   Adenocarcinoma by biopsy    Past Surgical History:  Procedure Laterality Date  . BIOPSY PROSTATE  2003 and 2005  . COLONOSCOPY  11/28/2008   Dr. Rourk:internal hemorrhoids/tortus colon/ascending colon polyps, tubular adenomas  . COLONOSCOPY N/A 09/04/2014   Procedure: COLONOSCOPY;  Surgeon: Daneil Dolin, MD;  [facility];  Service: Endoscopy;  Laterality: N/A;  9:30  . TOTAL KNEE ARTHROPLASTY Right 06/10/2017  . TOTAL KNEE ARTHROPLASTY Right 06/10/2017   Procedure: TOTAL KNEE ARTHROPLASTY;  Surgeon: [name], MD;  Location: [facility];  Service: Orthopedics;  Laterality: Right;    There were no vitals filed for this visit.      Subjective Assessment - 06/25/17 0906    Subjective Patient arrives today stating he is doing well, he is motivated to return to his walking program of 10K steps per day. He  continues to use the CPM.    Pertinent History xarelto, history of PE, history of prostate cancer (just monitoring now), neuropathy    Patient Stated Goals get back to normal ASAP    Currently in Pain? Yes   Pain Score 4    Pain Location Hip   Pain Orientation Right            [facility] PT Assessment - 06/25/17 0001      AROM   Right Knee Extension 3   Right Knee Flexion 75  seated                      [facility] Adult PT Treatment/Exercise - 06/25/17 0001      Knee/Hip Exercises: Stretches   Active Hamstring Stretch Right;3 reps;30 seconds   Active Hamstring Stretch Limitations 12 inch box    Knee: Self-Stretch Limitations 10x10 second holds, 12 inch box    Gastroc Stretch Both;3 reps;30 seconds   Gastroc Stretch Limitations slantboard      Knee/Hip Exercises: Standing   Gait Training heel-toe gait training 459ft no device S, min cues      Knee/Hip Exercises: Seated   Other Seated Knee/Hip Exercises seated knee flexion stretch 1x15, 5 second holds      Knee/Hip Exercises: Supine   Quad Sets Right;1 set;15 reps   Quad Sets Limitations 5 second  holds    Other Supine Knee/Hip Exercises R knee extension overpressure 1x10      Manual Therapy   Manual Therapy Edema management;Joint mobilization   Manual therapy comments performed separately from all otehr skilled services    Edema Management R LE elevated    Joint Mobilization patella mobilizations lateral and prox/distal grade III                 PT Education - 06/25/17 0941    Education provided Yes   Education Details review of initial eval/goals, encouraged regular walking program, heel toe gait, icing routine    Person(s) Educated Patient   Methods Explanation;Handout   Comprehension Verbalized understanding          PT Short Term Goals - 06/22/17 1042      PT SHORT TERM GOAL #1   Title Patient to demonstrate R knee ROM as being 0-115 degrees in order to improve mechanics and assist in return  to PLOF    Time 3   Period Weeks   Status New     PT SHORT TERM GOAL #2   Title Patient to be ambulating with no assistive device, consistent heel toe pattern, no unsteadiness to improve mobiilty and assist in return to PLOF    Time 3   Period Weeks   Status New     PT SHORT TERM GOAL #3   Title patient to be independent in correct performance of scar mobilization, patella mobility, and edema control in order to improve self-efficacy in managing condition    Time 3   Period Weeks   Status New     PT SHORT TERM GOAL #4   Title Patient to be compliant with correct performance of HEP, to be updated PRN    Time 1   Period Weeks   Status New           PT Long Term Goals - 06/22/17 1044      PT LONG TERM GOAL #1   Title Patient to demonstrate functional strength as being 5/5 in all tested muscle groups in order to improve giat and balance    Time 6   Period Weeks   Status New     PT LONG TERM GOAL #2   Title Patient to be able to complete TUG test in 10 seconds with no AD in order to show improved balance/reduced fall risk    Time 6   Period Weeks   Status New     PT LONG TERM GOAL #3   Title Patient to be able to perform mock-bowling exercise in clinic with no LOB and good form in order to ensure safety and success with individual attempt to return to bowling based tasks    Time 6   Period Weeks   Status New     PT LONG TERM GOAL #4   Title Patient to be able to ascend/descent full flight of stairs with U railing, no AD, reciprocal pattern, good eccentric control in order to show improved community and home access    Time 6   Period Weeks   Status New               Plan - 06/25/17 7673    Clinical Impression Statement Began session with retrograde massage to control edema and then proceeded with knee extension overpressure and active extension based exercises, followed by focus on knee flexion mobility and activities this session due to significant lack of  knee flexion  ROM noted at eval and continuing to note difficulty with this today. Patient doing well and already working on walking without his cane; he was seen by his MD earlier this week and they are happy with his progress/status.    Rehab Potential Good   Clinical Impairments Affecting Rehab Potential (+) motivated to participate in PT/return to PLOF, minimal PMH   PT Frequency 3x / week   PT Duration 6 weeks   PT Treatment/Interventions ADLs/Self Care Home Management;Biofeedback;Cryotherapy;DME Instruction;Gait training;Stair training;Functional mobility training;Therapeutic activities;Therapeutic exercise;Balance training;Neuromuscular re-education;Patient/family education;Manual techniques;Scar mobilization;Passive range of motion;Dry needling   PT Next Visit Plan focus on edema control and manual, ROM focus on flexion for now. Balance and gait training.    PT Home Exercise Plan Eval: quad sets, heel slides, lateral weight shfits, seated knee flexion stretch    Consulted and Agree with Plan of Care Patient      Patient will benefit from skilled therapeutic intervention in order to improve the following deficits and impairments:  Abnormal gait, Decreased skin integrity, Pain, Decreased mobility, Decreased scar mobility, Decreased activity tolerance, Decreased range of motion, Decreased strength, Hypomobility, Decreased balance, Difficulty walking, Increased edema, Impaired flexibility  Visit Diagnosis: Stiffness of right knee, not elsewhere classified  Localized edema  Difficulty in walking, not elsewhere classified  Unsteadiness on feet     Problem List Patient Active Problem List   Diagnosis Date Noted  . Pulmonary embolism ([place]) 06/18/2017  . Primary localized osteoarthritis of right knee 06/10/2017  . Upper airway cough syndrome 08/29/2015  . Prostate cancer ([place]) 08/17/2015  . Exposure to asbestos 08/17/2015  . Personal history of colonic polyps 08/08/2014  . Joint pain  06/05/2014  . Vitamin D deficiency 06/05/2014  . BPH (benign prostatic hypertrophy) 06/01/2013  . Mild hyperlipidemia 06/01/2013  . Routine general medical examination at a health care facility 06/01/2013    Deniece Ree PT, DPT [facility] [address] [place], [place], [address] Phone: [phone]   Fax:  [phone]  Name: DALEON WILLINGER MRN: [id number] Date of Birth: 1951-03-20

## 2017-06-26 ENCOUNTER — Other Ambulatory Visit: Payer: Self-pay | Admitting: *Deleted

## 2017-06-26 DIAGNOSIS — D649 Anemia, unspecified: Secondary | ICD-10-CM

## 2017-06-26 LAB — CBC WITH DIFFERENTIAL/PLATELET
BASOS PCT: 0 %
Basophils Absolute: 0 cells/uL (ref 0–200)
EOS ABS: 130 {cells}/uL (ref 15–500)
Eosinophils Relative: 2 %
HEMATOCRIT: 34 % — AB (ref 38.5–50.0)
Hemoglobin: 11.5 g/dL — ABNORMAL LOW (ref 13.0–17.0)
LYMPHS PCT: 17 %
Lymphs Abs: 1105 cells/uL (ref 850–3900)
MCH: 30.3 pg (ref 27.0–33.0)
MCHC: 33.8 g/dL (ref 32.0–36.0)
MCV: 89.5 fL (ref 80.0–100.0)
MONO ABS: 325 {cells}/uL (ref 200–950)
MONOS PCT: 5 %
MPV: 9.4 fL (ref 7.5–12.5)
NEUTROS PCT: 76 %
Neutro Abs: 4940 cells/uL (ref 1500–7800)
PLATELETS: 480 10*3/uL — AB (ref 140–400)
RBC: 3.8 MIL/uL — ABNORMAL LOW (ref 4.20–5.80)
RDW: 14.6 % (ref 11.0–15.0)
WBC: 6.5 10*3/uL (ref 3.8–10.8)

## 2017-06-26 LAB — COMPREHENSIVE METABOLIC PANEL
ALK PHOS: 71 U/L (ref 40–115)
ALT: 14 U/L (ref 9–46)
AST: 15 U/L (ref 10–35)
Albumin: 3.4 g/dL — ABNORMAL LOW (ref 3.6–5.1)
BUN: 15 mg/dL (ref 7–25)
CALCIUM: 9.4 mg/dL (ref 8.6–10.3)
CHLORIDE: 101 mmol/L (ref 98–110)
CO2: 29 mmol/L (ref 20–31)
Creat: 1.02 mg/dL (ref 0.70–1.25)
GLUCOSE: 127 mg/dL — AB (ref 70–99)
POTASSIUM: 4.4 mmol/L (ref 3.5–5.3)
Sodium: 140 mmol/L (ref 135–146)
Total Bilirubin: 0.6 mg/dL (ref 0.2–1.2)
Total Protein: 6.4 g/dL (ref 6.1–8.1)

## 2017-06-29 ENCOUNTER — Ambulatory Visit (HOSPITAL_COMMUNITY): Payer: Medicare Other | Admitting: Physical Therapy

## 2017-06-29 DIAGNOSIS — R262 Difficulty in walking, not elsewhere classified: Secondary | ICD-10-CM | POA: Diagnosis not present

## 2017-06-29 DIAGNOSIS — M25661 Stiffness of right knee, not elsewhere classified: Secondary | ICD-10-CM

## 2017-06-29 DIAGNOSIS — R600 Localized edema: Secondary | ICD-10-CM | POA: Diagnosis not present

## 2017-06-29 DIAGNOSIS — R2681 Unsteadiness on feet: Secondary | ICD-10-CM

## 2017-06-29 NOTE — Therapy (Signed)
[place], [place], [address] Phone: [phone]   Fax:  [phone]  Physical Therapy Treatment  Patient Details  Name: Luis Stewart MRN: [id number] Date of Birth: September 17, 1951 Referring Provider: Kathryne [place]   Encounter Date: 06/29/2017      PT End of Session - 06/29/17 1027    Visit Number 3   Number of Visits 19   Date for PT Re-Evaluation 07/13/17   Authorization Type Medicare primary, BCBS supplementary    Authorization Time Period 06/22/17 to 08/03/17   Authorization - Visit Number 3   Authorization - Number of Visits 10   PT Start Time 0946   PT Stop Time 1027   PT Time Calculation (min) 41 min   Activity Tolerance Patient tolerated treatment well   Behavior During Therapy [facility] for tasks assessed/performed      Past Medical History:  Diagnosis Date  . Arthritis   . Asbestosis ([place])   . Cyst of kidney, acquired   . Headache   . History of fracture of leg    Right, childhood  . Hx of migraine headaches   . Hyperlipidemia   . Neuropathy   . PE (pulmonary thromboembolism) ([place])    After knee surgery  . Prostate cancer ([place]) 2015   Adenocarcinoma by biopsy    Past Surgical History:  Procedure Laterality Date  . BIOPSY PROSTATE  2003 and 2005  . COLONOSCOPY  11/28/2008   Dr. Rourk:internal hemorrhoids/tortus colon/ascending colon polyps, tubular adenomas  . COLONOSCOPY N/A 09/04/2014   Procedure: COLONOSCOPY;  Surgeon: Daneil Dolin, MD;  [facility];  Service: Endoscopy;  Laterality: N/A;  9:30  . TOTAL KNEE ARTHROPLASTY Right 06/10/2017  . TOTAL KNEE ARTHROPLASTY Right 06/10/2017   Procedure: TOTAL KNEE ARTHROPLASTY;  Surgeon: [name], MD;  Location: [place];  Service: Orthopedics;  Laterality: Right;    There were no vitals filed for this visit.      Subjective Assessment - 06/29/17 0959    Subjective Patient arrives stating he is doing well, no major concerns  other than needing to get knee to bend more    Pertinent History xarelto, history of PE, history of prostate cancer (just monitoring now), neuropathy    Patient Stated Goals get back to normal ASAP    Currently in Pain? Yes   Pain Score 4    Pain Location Knee   Pain Orientation Right            [facility] PT Assessment - 06/29/17 0001      AROM   Right Knee Extension 0   Right Knee Flexion 80                     [facility] Adult PT Treatment/Exercise - 06/29/17 0001      Knee/Hip Exercises: Stretches   Active Hamstring Stretch Right;3 reps;30 seconds   Active Hamstring Stretch Limitations 12 inch box    Knee: Self-Stretch Limitations 15x10 second holds, 12 inch box    Gastroc Stretch Both;3 reps;30 seconds   Gastroc Stretch Limitations slantboard      Knee/Hip Exercises: Standing   Heel Raises Both;1 set;15 reps   Heel Raises Limitations heel and toe    Knee Flexion Right;1 set;10 reps     Manual Therapy   Manual Therapy Edema management;Joint mobilization   Manual therapy comments performed separately from all otehr skilled services    Edema Management R LE elevated  Joint Mobilization patella mobilizations lateral and prox/distal grade III; knee flexion tib on femur grade III mobilization              Balance Exercises - 06/29/17 1024      Balance Exercises: Standing   Tandem Stance Eyes open;Foam/compliant surface;3 reps;20 secs           PT Education - 06/29/17 1027    Education provided Yes   Education Details TED hose should be able to come off but he can call MD to confirm    Person(s) Educated Patient   Methods Explanation   Comprehension Verbalized understanding          PT Short Term Goals - 06/22/17 1042      PT SHORT TERM GOAL #1   Title Patient to demonstrate R knee ROM as being 0-115 degrees in order to improve mechanics and assist in return to PLOF    Time 3   Period Weeks   Status New     PT SHORT TERM GOAL #2   Title  Patient to be ambulating with no assistive device, consistent heel toe pattern, no unsteadiness to improve mobiilty and assist in return to PLOF    Time 3   Period Weeks   Status New     PT SHORT TERM GOAL #3   Title patient to be independent in correct performance of scar mobilization, patella mobility, and edema control in order to improve self-efficacy in managing condition    Time 3   Period Weeks   Status New     PT SHORT TERM GOAL #4   Title Patient to be compliant with correct performance of HEP, to be updated PRN    Time 1   Period Weeks   Status New           PT Long Term Goals - 06/22/17 1044      PT LONG TERM GOAL #1   Title Patient to demonstrate functional strength as being 5/5 in all tested muscle groups in order to improve giat and balance    Time 6   Period Weeks   Status New     PT LONG TERM GOAL #2   Title Patient to be able to complete TUG test in 10 seconds with no AD in order to show improved balance/reduced fall risk    Time 6   Period Weeks   Status New     PT LONG TERM GOAL #3   Title Patient to be able to perform mock-bowling exercise in clinic with no LOB and good form in order to ensure safety and success with individual attempt to return to bowling based tasks    Time 6   Period Weeks   Status New     PT LONG TERM GOAL #4   Title Patient to be able to ascend/descent full flight of stairs with U railing, no AD, reciprocal pattern, good eccentric control in order to show improved community and home access    Time 6   Period Weeks   Status New               Plan - 06/29/17 1027    Clinical Impression Statement Continued focus on manual for ROM and knee flexion based exercises/activities as well as pre-gait and balance tasks. Patient continues to do well and shows some improvement in ROM today (0-80 degrees), however does continue to demonstrate the majority of his impairment with knee flexion at this time.  Rehab Potential Good    Clinical Impairments Affecting Rehab Potential (+) motivated to participate in PT/return to PLOF, minimal PMH   PT Frequency 3x / week   PT Duration 6 weeks   PT Treatment/Interventions ADLs/Self Care Home Management;Biofeedback;Cryotherapy;DME Instruction;Gait training;Stair training;Functional mobility training;Therapeutic activities;Therapeutic exercise;Balance training;Neuromuscular re-education;Patient/family education;Manual techniques;Scar mobilization;Passive range of motion;Dry needling   PT Next Visit Plan continue to focus on edema control and manual including joint mobilizations for flexion. Balance and gait training.    PT Home Exercise Plan Eval: quad sets, heel slides, lateral weight shfits, seated knee flexion stretch    Consulted and Agree with Plan of Care Patient      Patient will benefit from skilled therapeutic intervention in order to improve the following deficits and impairments:  Abnormal gait, Decreased skin integrity, Pain, Decreased mobility, Decreased scar mobility, Decreased activity tolerance, Decreased range of motion, Decreased strength, Hypomobility, Decreased balance, Difficulty walking, Increased edema, Impaired flexibility  Visit Diagnosis: Stiffness of right knee, not elsewhere classified  Localized edema  Difficulty in walking, not elsewhere classified  Unsteadiness on feet     Problem List Patient Active Problem List   Diagnosis Date Noted  . Postoperative anemia 06/25/2017  . Pulmonary embolism ([place]) 06/18/2017  . Primary localized osteoarthritis of right knee 06/10/2017  . Upper airway cough syndrome 08/29/2015  . Prostate cancer ([place]) 08/17/2015  . Exposure to asbestos 08/17/2015  . Personal history of colonic polyps 08/08/2014  . Joint pain 06/05/2014  . Vitamin D deficiency 06/05/2014  . BPH (benign prostatic hypertrophy) 06/01/2013  . Mild hyperlipidemia 06/01/2013  . Routine general medical examination at a health care facility  06/01/2013    Deniece Ree PT, DPT [facility] [address] [place], [place], [address] Phone: [phone]   Fax:  [phone]  Name: Luis Stewart MRN: [id number] Date of Birth: 05/21/1951

## 2017-07-01 ENCOUNTER — Ambulatory Visit (HOSPITAL_COMMUNITY): Payer: Medicare Other | Admitting: Physical Therapy

## 2017-07-01 DIAGNOSIS — M25661 Stiffness of right knee, not elsewhere classified: Secondary | ICD-10-CM

## 2017-07-01 DIAGNOSIS — R262 Difficulty in walking, not elsewhere classified: Secondary | ICD-10-CM

## 2017-07-01 DIAGNOSIS — R2681 Unsteadiness on feet: Secondary | ICD-10-CM | POA: Diagnosis not present

## 2017-07-01 DIAGNOSIS — R600 Localized edema: Secondary | ICD-10-CM

## 2017-07-01 NOTE — Therapy (Signed)
[place], [place], [address] Phone: [phone]   Fax:  [phone]  Physical Therapy Treatment  Patient Details  Name: Luis Stewart MRN: [id number] Date of Birth: 29-Jun-1951 Referring Provider: Kathryne [place]   Encounter Date: 07/01/2017      PT End of Session - 07/01/17 1027    Visit Number 4   Number of Visits 19   Date for PT Re-Evaluation 07/13/17   Authorization Type Medicare primary, BCBS supplementary    Authorization Time Period 06/22/17 to 08/03/17   Authorization - Visit Number 4   Authorization - Number of Visits 10   PT Start Time 0947   PT Stop Time 1027   PT Time Calculation (min) 40 min   Activity Tolerance Patient tolerated treatment well   Behavior During Therapy [facility] for tasks assessed/performed      Past Medical History:  Diagnosis Date  . Arthritis   . Asbestosis ([place])   . Cyst of kidney, acquired   . Headache   . History of fracture of leg    Right, childhood  . Hx of migraine headaches   . Hyperlipidemia   . Neuropathy   . PE (pulmonary thromboembolism) ([place])    After knee surgery  . Prostate cancer ([place]) 2015   Adenocarcinoma by biopsy    Past Surgical History:  Procedure Laterality Date  . BIOPSY PROSTATE  2003 and 2005  . COLONOSCOPY  11/28/2008   Dr. Rourk:internal hemorrhoids/tortus colon/ascending colon polyps, tubular adenomas  . COLONOSCOPY N/A 09/04/2014   Procedure: COLONOSCOPY;  Surgeon: Daneil Dolin, MD;  [facility];  Service: Endoscopy;  Laterality: N/A;  9:30  . TOTAL KNEE ARTHROPLASTY Right 06/10/2017  . TOTAL KNEE ARTHROPLASTY Right 06/10/2017   Procedure: TOTAL KNEE ARTHROPLASTY;  Surgeon: [name], MD;  Location: [place];  Service: Orthopedics;  Laterality: Right;    There were no vitals filed for this visit.      Subjective Assessment - 07/01/17 0950    Subjective Patient arrives stating his MD has given him permission to  start tylenol but does want him to stay in compression stockings for a couple more weeks    Pertinent History xarelto, history of PE, history of prostate cancer (just monitoring now), neuropathy    Patient Stated Goals get back to normal ASAP    Currently in Pain? Yes   Pain Score 2    Pain Location Knee   Pain Orientation Right   Pain Descriptors / Indicators Aching   Pain Type Surgical pain   Pain Radiating Towards none    Pain Onset 1 to 4 weeks ago   Pain Frequency Constant   Aggravating Factors  none    Pain Relieving Factors ice and elevation    Effect of Pain on Daily Activities moderate impact             [facility] PT Assessment - 07/01/17 0001      AROM   Right Knee Extension 0   Right Knee Flexion 82                     [facility] Adult PT Treatment/Exercise - 07/01/17 0001      Knee/Hip Exercises: Stretches   Sports administrator Right;2 reps;30 seconds   Quad Stretch Limitations prone    Knee: Self-Stretch Limitations 15x10 second holds, 12 inch box    Gastroc Stretch Both;3 reps;30 seconds   Gastroc Stretch Limitations slantboard  Knee/Hip Exercises: Standing   Heel Raises Both;1 set;20 reps   Heel Raises Limitations heel and toe    Rocker Board 2 minutes   Rocker Board Limitations AP and lateral U HHA      Manual Therapy   Manual Therapy Edema management;Soft tissue mobilization   Manual therapy comments performed separately from all otehr skilled services    Edema Management R LE elevated    Joint Mobilization patella mobilizations all directions    Soft tissue mobilization STM to R quad, ball assisted                 PT Education - 07/01/17 1027    Education provided Yes   Education Details continued to encourage regular icing routine    Person(s) Educated Patient   Methods Explanation   Comprehension Verbalized understanding          PT Short Term Goals - 06/22/17 1042      PT SHORT TERM GOAL #1   Title Patient to demonstrate  R knee ROM as being 0-115 degrees in order to improve mechanics and assist in return to PLOF    Time 3   Period Weeks   Status New     PT SHORT TERM GOAL #2   Title Patient to be ambulating with no assistive device, consistent heel toe pattern, no unsteadiness to improve mobiilty and assist in return to PLOF    Time 3   Period Weeks   Status New     PT SHORT TERM GOAL #3   Title patient to be independent in correct performance of scar mobilization, patella mobility, and edema control in order to improve self-efficacy in managing condition    Time 3   Period Weeks   Status New     PT SHORT TERM GOAL #4   Title Patient to be compliant with correct performance of HEP, to be updated PRN    Time 1   Period Weeks   Status New           PT Long Term Goals - 06/22/17 1044      PT LONG TERM GOAL #1   Title Patient to demonstrate functional strength as being 5/5 in all tested muscle groups in order to improve giat and balance    Time 6   Period Weeks   Status New     PT LONG TERM GOAL #2   Title Patient to be able to complete TUG test in 10 seconds with no AD in order to show improved balance/reduced fall risk    Time 6   Period Weeks   Status New     PT LONG TERM GOAL #3   Title Patient to be able to perform mock-bowling exercise in clinic with no LOB and good form in order to ensure safety and success with individual attempt to return to bowling based tasks    Time 6   Period Weeks   Status New     PT LONG TERM GOAL #4   Title Patient to be able to ascend/descent full flight of stairs with U railing, no AD, reciprocal pattern, good eccentric control in order to show improved community and home access    Time 6   Period Weeks   Status New               Plan - 07/01/17 1028    Clinical Impression Statement Spent quite a bit of time on manual interventions this session, including edema control,  patella mobility, and introduction of STM R quad due to patient  reports of tightness and discomfort in this area and noting significant spasm and knotting in this muscle group. Otherwise continued working on mobility based exercises with focus on knee flexion as well as balance and pre-gait activities today.    Rehab Potential Good   Clinical Impairments Affecting Rehab Potential (+) motivated to participate in PT/return to PLOF, minimal PMH   PT Frequency 3x / week   PT Duration 6 weeks   PT Treatment/Interventions ADLs/Self Care Home Management;Biofeedback;Cryotherapy;DME Instruction;Gait training;Stair training;Functional mobility training;Therapeutic activities;Therapeutic exercise;Balance training;Neuromuscular re-education;Patient/family education;Manual techniques;Scar mobilization;Passive range of motion;Dry needling   PT Next Visit Plan continue all manual including quad STM; introduce bike for ROM. Focus on flexion. Balance and gait training.    PT Home Exercise Plan Eval: quad sets, heel slides, lateral weight shfits, seated knee flexion stretch    Consulted and Agree with Plan of Care Patient      Patient will benefit from skilled therapeutic intervention in order to improve the following deficits and impairments:  Abnormal gait, Decreased skin integrity, Pain, Decreased mobility, Decreased scar mobility, Decreased activity tolerance, Decreased range of motion, Decreased strength, Hypomobility, Decreased balance, Difficulty walking, Increased edema, Impaired flexibility  Visit Diagnosis: Stiffness of right knee, not elsewhere classified  Localized edema  Difficulty in walking, not elsewhere classified  Unsteadiness on feet     Problem List Patient Active Problem List   Diagnosis Date Noted  . Postoperative anemia 06/25/2017  . Pulmonary embolism ([place]) 06/18/2017  . Primary localized osteoarthritis of right knee 06/10/2017  . Upper airway cough syndrome 08/29/2015  . Prostate cancer ([place]) 08/17/2015  . Exposure to asbestos 08/17/2015   . Personal history of colonic polyps 08/08/2014  . Joint pain 06/05/2014  . Vitamin D deficiency 06/05/2014  . BPH (benign prostatic hypertrophy) 06/01/2013  . Mild hyperlipidemia 06/01/2013  . Routine general medical examination at a health care facility 06/01/2013    Deniece Ree PT, DPT [facility] [address] [place], [place], [address] Phone: [phone]   Fax:  [phone]  Name: Luis Stewart MRN: [id number] Date of Birth: 1951/08/07

## 2017-07-03 ENCOUNTER — Ambulatory Visit (HOSPITAL_COMMUNITY): Payer: Medicare Other

## 2017-07-03 DIAGNOSIS — R2681 Unsteadiness on feet: Secondary | ICD-10-CM

## 2017-07-03 DIAGNOSIS — R262 Difficulty in walking, not elsewhere classified: Secondary | ICD-10-CM

## 2017-07-03 DIAGNOSIS — R600 Localized edema: Secondary | ICD-10-CM | POA: Diagnosis not present

## 2017-07-03 DIAGNOSIS — M25661 Stiffness of right knee, not elsewhere classified: Secondary | ICD-10-CM

## 2017-07-03 NOTE — Therapy (Signed)
[facility] [address] [place], [place], [address] Phone: [phone]   Fax:  [phone]  Physical Therapy Treatment  Patient Details  Name: Luis Stewart MRN: [id number] Date of Birth: [birth year] Referring Provider: Kathryne [place]  Encounter Date: 07/03/2017      PT End of Session - 07/03/17 1306    Visit Number 5   Number of Visits 19   Date for PT Re-Evaluation 07/13/17   Authorization Type Medicare primary, BCBS supplementary    Authorization Time Period 06/22/17 to 08/03/17   Authorization - Visit Number 5   Authorization - Number of Visits 10   PT Start Time 1301   PT Stop Time 3299  5' on bike at EOS   PT Time Calculation (min) 46 min   Activity Tolerance Patient tolerated treatment well;No increased pain   Behavior During Therapy [facility] for tasks assessed/performed      Past Medical History:  Diagnosis Date  . Arthritis   . Asbestosis ([place])   . Cyst of kidney, acquired   . Headache   . History of fracture of leg    Right, childhood  . Hx of migraine headaches   . Hyperlipidemia   . Neuropathy   . PE (pulmonary thromboembolism) ([place])    After knee surgery  . Prostate cancer ([place]) 2015   Adenocarcinoma by biopsy    Past Surgical History:  Procedure Laterality Date  . BIOPSY PROSTATE  2003 and 2005  . COLONOSCOPY  11/28/2008   Dr. Rourk:internal hemorrhoids/tortus colon/ascending colon polyps, tubular adenomas  . COLONOSCOPY N/A 09/04/2014   Procedure: COLONOSCOPY;  Surgeon: Daneil Dolin, MD;  [facility];  Service: Endoscopy;  Laterality: N/A;  9:30  . TOTAL KNEE ARTHROPLASTY Right 06/10/2017  . TOTAL KNEE ARTHROPLASTY Right 06/10/2017   Procedure: TOTAL KNEE ARTHROPLASTY;  Surgeon: [name], MD;  Location: [place];  Service: Orthopedics;  Laterality: Right;    There were no vitals filed for this visit.      Subjective Assessment - 07/03/17 1304    Subjective Pt stated knee tight,  continues to wear compression stocking.  No reoprts of pain today.  Pt continues to use CPM, reports last day.   Pertinent History xarelto, history of PE, history of prostate cancer (just monitoring now), neuropathy    Patient Stated Goals get back to normal ASAP    Currently in Pain? No/denies   Pain Location Knee   Pain Descriptors / Indicators Tightness            [facility] PT Assessment - 07/03/17 0001      Assessment   Medical Diagnosis R TKR    Referring Provider Kathryne [place]   Onset Date/Surgical Date 06/10/17   Next MD Visit 07/21/2017   Prior Therapy HHPT for TKR, has been DCed      Precautions   Precautions Fall;Other (comment)   Precaution Comments active xarelto      AROM   Right Knee Extension 0   Right Knee Flexion 90                     [facility] Adult PT Treatment/Exercise - 07/03/17 0001      Knee/Hip Exercises: Stretches   Sports administrator Right;2 reps;30 seconds   Quad Stretch Limitations prone    Knee: Self-Stretch Limitations 15x10 second holds, 12 inch box      Knee/Hip Exercises: Aerobic   Stationary Bike rocking seat 22 x 50min at  [facility] (not included with billing)     Knee/Hip Exercises: Standing   Heel Raises Both;1 set;20 reps   Heel Raises Limitations heel and toe    Knee Flexion Right;15 reps     Knee/Hip Exercises: Seated   Sit to Sand 5 reps;without UE support  cueing for equal weight bearing     Knee/Hip Exercises: Supine   Heel Slides Right;1 set;10 reps   Heel Slides Limitations 3 second holds      Manual Therapy   Manual Therapy Edema management;Joint mobilization;Soft tissue mobilization   Manual therapy comments performed separately from all otehr skilled services    Edema Management Retro massage with LE elevated    Joint Mobilization patella mobilizations all directions; fib head   Soft tissue mobilization STM to R quad and ITB                  PT Short Term Goals - 06/22/17 1042      PT SHORT TERM GOAL #1    Title Patient to demonstrate R knee ROM as being 0-115 degrees in order to improve mechanics and assist in return to PLOF    Time 3   Period Weeks   Status New     PT SHORT TERM GOAL #2   Title Patient to be ambulating with no assistive device, consistent heel toe pattern, no unsteadiness to improve mobiilty and assist in return to PLOF    Time 3   Period Weeks   Status New     PT SHORT TERM GOAL #3   Title patient to be independent in correct performance of scar mobilization, patella mobility, and edema control in order to improve self-efficacy in managing condition    Time 3   Period Weeks   Status New     PT SHORT TERM GOAL #4   Title Patient to be compliant with correct performance of HEP, to be updated PRN    Time 1   Period Weeks   Status New           PT Long Term Goals - 06/22/17 1044      PT LONG TERM GOAL #1   Title Patient to demonstrate functional strength as being 5/5 in all tested muscle groups in order to improve giat and balance    Time 6   Period Weeks   Status New     PT LONG TERM GOAL #2   Title Patient to be able to complete TUG test in 10 seconds with no AD in order to show improved balance/reduced fall risk    Time 6   Period Weeks   Status New     PT LONG TERM GOAL #3   Title Patient to be able to perform mock-bowling exercise in clinic with no LOB and good form in order to ensure safety and success with individual attempt to return to bowling based tasks    Time 6   Period Weeks   Status New     PT LONG TERM GOAL #4   Title Patient to be able to ascend/descent full flight of stairs with U railing, no AD, reciprocal pattern, good eccentric control in order to show improved community and home access    Time 6   Period Weeks   Status New               Plan - 07/03/17 1337    Clinical Impression Statement Session focus with knee mobility to improve flexion and gait mechanics.  Began session wiht manual technqiues to address edema  present proximal knee, soft tissue mobilization to quadriceps with noted tightness on ITB and patella mobs.  Introduced prone PROM for flexion and began rocking bicycle to improve knee mobiliy.  Pt tolerated well towards treatment with no reoprts of increased pain through session.  Reviewed compliance iwht HEP and application of ice for pain and edema control.     Rehab Potential Good   Clinical Impairments Affecting Rehab Potential (+) motivated to participate in PT/return to PLOF, minimal PMH   PT Frequency 3x / week   PT Duration 6 weeks   PT Treatment/Interventions ADLs/Self Care Home Management;Biofeedback;Cryotherapy;DME Instruction;Gait training;Stair training;Functional mobility training;Therapeutic activities;Therapeutic exercise;Balance training;Neuromuscular re-education;Patient/family education;Manual techniques;Scar mobilization;Passive range of motion;Dry needling   PT Next Visit Plan continue all manual including quad STM; bike for ROM. Focus on flexion. Balance and gait training.  Add contract/relax to improve flexion      Patient will benefit from skilled therapeutic intervention in order to improve the following deficits and impairments:  Abnormal gait, Decreased skin integrity, Pain, Decreased mobility, Decreased scar mobility, Decreased activity tolerance, Decreased range of motion, Decreased strength, Hypomobility, Decreased balance, Difficulty walking, Increased edema, Impaired flexibility  Visit Diagnosis: Stiffness of right knee, not elsewhere classified  Localized edema  Difficulty in walking, not elsewhere classified  Unsteadiness on feet     Problem List Patient Active Problem List   Diagnosis Date Noted  . Postoperative anemia 06/25/2017  . Pulmonary embolism ([place]) 06/18/2017  . Primary localized osteoarthritis of right knee 06/10/2017  . Upper airway cough syndrome 08/29/2015  . Prostate cancer ([place]) 08/17/2015  . Exposure to asbestos 08/17/2015  .  Personal history of colonic polyps 08/08/2014  . Joint pain 06/05/2014  . Vitamin D deficiency 06/05/2014  . BPH (benign prostatic hypertrophy) 06/01/2013  . Mild hyperlipidemia 06/01/2013  . Routine general medical examination at a health care facility 06/01/2013   Ihor Austin, [place]; [place]  Aldona Lento 07/03/2017, 1:56 PM  [facility] [place], [place], [address] Phone: [phone]   Fax:  [phone]  Name: Luis Stewart MRN: [id number] Date of Birth: 1951/10/05

## 2017-07-06 ENCOUNTER — Ambulatory Visit (HOSPITAL_COMMUNITY): Payer: Medicare Other | Admitting: Physical Therapy

## 2017-07-06 DIAGNOSIS — R262 Difficulty in walking, not elsewhere classified: Secondary | ICD-10-CM | POA: Diagnosis not present

## 2017-07-06 DIAGNOSIS — R2681 Unsteadiness on feet: Secondary | ICD-10-CM

## 2017-07-06 DIAGNOSIS — R600 Localized edema: Secondary | ICD-10-CM | POA: Diagnosis not present

## 2017-07-06 DIAGNOSIS — M25661 Stiffness of right knee, not elsewhere classified: Secondary | ICD-10-CM

## 2017-07-06 NOTE — Therapy (Signed)
[place], [place], [address] Phone: [phone]   Fax:  [phone]  Physical Therapy Treatment  Patient Details  Name: Luis Stewart MRN: [id number] Date of Birth: 25-Dec-1950 Referring Provider: Kathryne Hitch  Encounter Date: 07/06/2017      PT End of Session - 07/06/17 1028    Visit Number 6   Number of Visits 19   Date for PT Re-Evaluation 07/13/17   Authorization Type Medicare primary, BCBS supplementary    Authorization Time Period 06/22/17 to 08/03/17   Authorization - Visit Number 6   Authorization - Number of Visits 10   PT Start Time [phone]   PT Stop Time 1027   PT Time Calculation (min) 39 min   Activity Tolerance Patient tolerated treatment well   Behavior During Therapy [facility] for tasks assessed/performed      Past Medical History:  Diagnosis Date  . Arthritis   . Asbestosis ([place])   . Cyst of kidney, acquired   . Headache   . History of fracture of leg    Right, childhood  . Hx of migraine headaches   . Hyperlipidemia   . Neuropathy   . PE (pulmonary thromboembolism) ([place])    After knee surgery  . Prostate cancer ([place]) 2015   Adenocarcinoma by biopsy    Past Surgical History:  Procedure Laterality Date  . BIOPSY PROSTATE  2003 and 2005  . COLONOSCOPY  11/28/2008   Dr. Rourk:internal hemorrhoids/tortus colon/ascending colon polyps, tubular adenomas  . COLONOSCOPY N/A 09/04/2014   Procedure: COLONOSCOPY;  Surgeon: Daneil Dolin, MD;  [facility];  Service: Endoscopy;  Laterality: N/A;  9:30  . TOTAL KNEE ARTHROPLASTY Right 06/10/2017  . TOTAL KNEE ARTHROPLASTY Right 06/10/2017   Procedure: TOTAL KNEE ARTHROPLASTY;  Surgeon: [name], MD;  Location: [place];  Service: Orthopedics;  Laterality: Right;    There were no vitals filed for this visit.      Subjective Assessment - 07/06/17 0950    Subjective Patient arrives stating that he is doing well, he felt good  after last session. His CPM stopped working but they should be coming to pick it up soon anyway. His knee felt good after last session.    Pertinent History xarelto, history of PE, history of prostate cancer (just monitoring now), neuropathy    Patient Stated Goals get back to normal ASAP    Currently in Pain? Yes   Pain Score 2    Pain Location Knee   Pain Orientation Right                         [facility] Adult PT Treatment/Exercise - 07/06/17 0001      Knee/Hip Exercises: Stretches   Active Hamstring Stretch Right;3 reps;30 seconds   Active Hamstring Stretch Limitations stairs    Knee: Self-Stretch Limitations 15x15 seconds    Gastroc Stretch Both;3 reps;30 seconds   Gastroc Stretch Limitations slantboard      Knee/Hip Exercises: Aerobic   Stationary Bike rocking seat 22 x8 minutes EOS   not included in billing      Knee/Hip Exercises: Standing   Terminal Knee Extension Limitations x15, 3 second holds R LE      Manual Therapy   Manual Therapy Joint mobilization;Soft tissue mobilization   Manual therapy comments performed separately from all otehr skilled services    Edema Management Retro massage with LE elevated    Joint Mobilization  patella mobilizations all directions; tib on femur flexion grade III joint mob x5 rounds    Soft tissue mobilization STM to R quad and ITB                PT Education - 07/06/17 1028    Education provided No          PT Short Term Goals - 06/22/17 1042      PT SHORT TERM GOAL #1   Title Patient to demonstrate R knee ROM as being 0-115 degrees in order to improve mechanics and assist in return to PLOF    Time 3   Period Weeks   Status New     PT SHORT TERM GOAL #2   Title Patient to be ambulating with no assistive device, consistent heel toe pattern, no unsteadiness to improve mobiilty and assist in return to PLOF    Time 3   Period Weeks   Status New     PT SHORT TERM GOAL #3   Title patient to be  independent in correct performance of scar mobilization, patella mobility, and edema control in order to improve self-efficacy in managing condition    Time 3   Period Weeks   Status New     PT SHORT TERM GOAL #4   Title Patient to be compliant with correct performance of HEP, to be updated PRN    Time 1   Period Weeks   Status New           PT Long Term Goals - 06/22/17 1044      PT LONG TERM GOAL #1   Title Patient to demonstrate functional strength as being 5/5 in all tested muscle groups in order to improve giat and balance    Time 6   Period Weeks   Status New     PT LONG TERM GOAL #2   Title Patient to be able to complete TUG test in 10 seconds with no AD in order to show improved balance/reduced fall risk    Time 6   Period Weeks   Status New     PT LONG TERM GOAL #3   Title Patient to be able to perform mock-bowling exercise in clinic with no LOB and good form in order to ensure safety and success with individual attempt to return to bowling based tasks    Time 6   Period Weeks   Status New     PT LONG TERM GOAL #4   Title Patient to be able to ascend/descent full flight of stairs with U railing, no AD, reciprocal pattern, good eccentric control in order to show improved community and home access    Time 6   Period Weeks   Status New               Plan - 07/06/17 1029    Clinical Impression Statement Continued with extensive manual for edema control and ROM impairments, followed by ROM and mobility based activities at stairs this session. Patient continues to demonstrate significant flexion ROM deficits which we will continue to plan to address during skilled PT services moving forward.    Rehab Potential Good   Clinical Impairments Affecting Rehab Potential (+) motivated to participate in PT/return to PLOF, minimal PMH   PT Frequency 3x / week   PT Duration 6 weeks   PT Treatment/Interventions ADLs/Self Care Home Management;Biofeedback;Cryotherapy;DME  Instruction;Gait training;Stair training;Functional mobility training;Therapeutic activities;Therapeutic exercise;Balance training;Neuromuscular re-education;Patient/family education;Manual techniques;Scar mobilization;Passive range of motion;Dry needling  PT Next Visit Plan continue all manual and bike for ROM with flexion focus (monitor extension). Balance and gait. Contract/relax. Trial ITB stretch R LE. Give HEP update.    PT Home Exercise Plan Eval: quad sets, heel slides, lateral weight shfits, seated knee flexion stretch    Consulted and Agree with Plan of Care Patient      Patient will benefit from skilled therapeutic intervention in order to improve the following deficits and impairments:  Abnormal gait, Decreased skin integrity, Pain, Decreased mobility, Decreased scar mobility, Decreased activity tolerance, Decreased range of motion, Decreased strength, Hypomobility, Decreased balance, Difficulty walking, Increased edema, Impaired flexibility  Visit Diagnosis: Stiffness of right knee, not elsewhere classified  Localized edema  Difficulty in walking, not elsewhere classified  Unsteadiness on feet     Problem List Patient Active Problem List   Diagnosis Date Noted  . Postoperative anemia 06/25/2017  . Pulmonary embolism ([place]) 06/18/2017  . Primary localized osteoarthritis of right knee 06/10/2017  . Upper airway cough syndrome 08/29/2015  . Prostate cancer ([place]) 08/17/2015  . Exposure to asbestos 08/17/2015  . Personal history of colonic polyps 08/08/2014  . Joint pain 06/05/2014  . Vitamin D deficiency 06/05/2014  . BPH (benign prostatic hypertrophy) 06/01/2013  . Mild hyperlipidemia 06/01/2013  . Routine general medical examination at a health care facility 06/01/2013    Deniece Ree PT, DPT [facility] [address] [place], [place], [address] Phone: [phone]   Fax:  [phone]  Name: MICKEL SCHREUR MRN: [id number] Date of Birth: 12-31-1950

## 2017-07-08 ENCOUNTER — Ambulatory Visit (HOSPITAL_COMMUNITY): Payer: Medicare Other | Admitting: Physical Therapy

## 2017-07-08 DIAGNOSIS — R2681 Unsteadiness on feet: Secondary | ICD-10-CM | POA: Diagnosis not present

## 2017-07-08 DIAGNOSIS — M25661 Stiffness of right knee, not elsewhere classified: Secondary | ICD-10-CM

## 2017-07-08 DIAGNOSIS — R600 Localized edema: Secondary | ICD-10-CM | POA: Diagnosis not present

## 2017-07-08 DIAGNOSIS — R262 Difficulty in walking, not elsewhere classified: Secondary | ICD-10-CM | POA: Diagnosis not present

## 2017-07-08 NOTE — Patient Instructions (Signed)
   Prone Quad Stretch   Place strap/belt around foot. Lay on stomach and hold strap over opposite should as to the leg. Pull strap bending knee until stretch is felt in front of leg.  You may also have your wife push your leg into the stretch instead of using the strap.  Hold for at least 30 seconds and relax. Repeat 3 times on the right leg, twice a day.

## 2017-07-08 NOTE — Therapy (Signed)
[place], [place], [address] Phone: [phone]   Fax:  [phone]  Physical Therapy Treatment  Patient Details  Name: Luis Stewart MRN: [id number] Date of Birth: 05/17/1951 Referring Provider: Kathryne Hitch  Encounter Date: 07/08/2017      PT End of Session - 07/08/17 1028    Visit Number 7   Number of Visits 19   Date for PT Re-Evaluation 07/13/17   Authorization Type Medicare primary, BCBS supplementary    Authorization Time Period 06/22/17 to 08/03/17   Authorization - Visit Number 7   Authorization - Number of Visits 10   PT Start Time [phone]   PT Stop Time 1027   PT Time Calculation (min) 39 min   Activity Tolerance Patient tolerated treatment well   Behavior During Therapy [facility] for tasks assessed/performed      Past Medical History:  Diagnosis Date  . Arthritis   . Asbestosis ([place])   . Cyst of kidney, acquired   . Headache   . History of fracture of leg    Right, childhood  . Hx of migraine headaches   . Hyperlipidemia   . Neuropathy   . PE (pulmonary thromboembolism) ([place])    After knee surgery  . Prostate cancer ([place]) 2015   Adenocarcinoma by biopsy    Past Surgical History:  Procedure Laterality Date  . BIOPSY PROSTATE  2003 and 2005  . COLONOSCOPY  11/28/2008   Dr. Rourk:internal hemorrhoids/tortus colon/ascending colon polyps, tubular adenomas  . COLONOSCOPY N/A 09/04/2014   Procedure: COLONOSCOPY;  Surgeon: Daneil Dolin, MD;  [facility];  Service: Endoscopy;  Laterality: N/A;  9:30  . TOTAL KNEE ARTHROPLASTY Right 06/10/2017  . TOTAL KNEE ARTHROPLASTY Right 06/10/2017   Procedure: TOTAL KNEE ARTHROPLASTY;  Surgeon: [name], MD;  Location: [place];  Service: Orthopedics;  Laterality: Right;    There were no vitals filed for this visit.      Subjective Assessment - 07/08/17 0950    Subjective Patient arrives stating he was driving a lot the other day and  his knee has been stiff and sore; he is wondering if he is really making a lot of progress so far   Pertinent History xarelto, history of PE, history of prostate cancer (just monitoring now), neuropathy    Patient Stated Goals get back to normal ASAP    Currently in Pain? Yes   Pain Score 2    Pain Location Knee   Pain Orientation Right   Pain Descriptors / Indicators Tightness   Pain Type Surgical pain   Pain Radiating Towards none    Pain Onset 1 to 4 weeks ago   Pain Frequency Constant   Aggravating Factors  nothing   Pain Relieving Factors ice and elevation    Effect of Pain on Daily Activities moderate impact                          [facility] Adult PT Treatment/Exercise - 07/08/17 0001      Knee/Hip Exercises: Stretches   Active Hamstring Stretch Right;3 reps;30 seconds   Active Hamstring Stretch Limitations stairs    Quad Stretch Right;3 reps;30 seconds   Quad Stretch Limitations prone    Knee: Self-Stretch to increase Flexion Right   Knee: Self-Stretch Limitations 15x15 seconds    Gastroc Stretch Both;3 reps;30 seconds   Gastroc Stretch Limitations slantboard      Knee/Hip Exercises: Aerobic  Stationary Bike rocking seat 22 x10 minutes EOS   working twoards full revoluation; not included in billing      Knee/Hip Exercises: Standing   Heel Raises Both;1 set;20 reps   Heel Raises Limitations heel, foam    Rocker Board 2 minutes   Rocker Board Limitations AP and lateral intermittent HHA      Manual Therapy   Manual Therapy Soft tissue mobilization   Manual therapy comments performed separately from all otehr skilled services    Soft tissue mobilization STM to R quad and ITB             Balance Exercises - 07/08/17 1020      Balance Exercises: Standing   SLS Eyes open;Solid surface;3 reps;10 secs           PT Education - 07/08/17 1028    Education provided Yes   Education Details HEP updates, encouraged moist heat to tight LE muscles  (but ice only on knee tiself), regular walking as much as tolerated    Person(s) Educated Patient   Methods Explanation;Handout   Comprehension Verbalized understanding          PT Short Term Goals - 06/22/17 1042      PT SHORT TERM GOAL #1   Title Patient to demonstrate R knee ROM as being 0-115 degrees in order to improve mechanics and assist in return to PLOF    Time 3   Period Weeks   Status New     PT SHORT TERM GOAL #2   Title Patient to be ambulating with no assistive device, consistent heel toe pattern, no unsteadiness to improve mobiilty and assist in return to PLOF    Time 3   Period Weeks   Status New     PT SHORT TERM GOAL #3   Title patient to be independent in correct performance of scar mobilization, patella mobility, and edema control in order to improve self-efficacy in managing condition    Time 3   Period Weeks   Status New     PT SHORT TERM GOAL #4   Title Patient to be compliant with correct performance of HEP, to be updated PRN    Time 1   Period Weeks   Status New           PT Long Term Goals - 06/22/17 1044      PT LONG TERM GOAL #1   Title Patient to demonstrate functional strength as being 5/5 in all tested muscle groups in order to improve giat and balance    Time 6   Period Weeks   Status New     PT LONG TERM GOAL #2   Title Patient to be able to complete TUG test in 10 seconds with no AD in order to show improved balance/reduced fall risk    Time 6   Period Weeks   Status New     PT LONG TERM GOAL #3   Title Patient to be able to perform mock-bowling exercise in clinic with no LOB and good form in order to ensure safety and success with individual attempt to return to bowling based tasks    Time 6   Period Weeks   Status New     PT LONG TERM GOAL #4   Title Patient to be able to ascend/descent full flight of stairs with U railing, no AD, reciprocal pattern, good eccentric control in order to show improved community and home  access    Time  6   Period Weeks   Status New               Plan - 07/08/17 1029    Clinical Impression Statement Patient arrives in good spirits but reporting he is frustrated with his ROM as it is only coming along slowly. Continued with STM to R quad and ITB/TFL and continued to focus on ROM based activities and exercises today as well as balance and pre-gait activities. Provided patient with quad stretch to do at home to assist in improving flexion ROM moving forward.    Rehab Potential Good   Clinical Impairments Affecting Rehab Potential (+) motivated to participate in PT/return to PLOF, minimal PMH   PT Frequency 3x / week   PT Duration 6 weeks   PT Treatment/Interventions ADLs/Self Care Home Management;Biofeedback;Cryotherapy;DME Instruction;Gait training;Stair training;Functional mobility training;Therapeutic activities;Therapeutic exercise;Balance training;Neuromuscular re-education;Patient/family education;Manual techniques;Scar mobilization;Passive range of motion;Dry needling   PT Next Visit Plan continue all manual and bike for ROM with flexion focus (monitor extension). Balance and gait. Contract/relax for ROM.    PT Home Exercise Plan Eval: quad sets, heel slides, lateral weight shfits, seated knee flexion stretch; 7/18 prone quad stretch    Consulted and Agree with Plan of Care Patient      Patient will benefit from skilled therapeutic intervention in order to improve the following deficits and impairments:  Abnormal gait, Decreased skin integrity, Pain, Decreased mobility, Decreased scar mobility, Decreased activity tolerance, Decreased range of motion, Decreased strength, Hypomobility, Decreased balance, Difficulty walking, Increased edema, Impaired flexibility  Visit Diagnosis: Stiffness of right knee, not elsewhere classified  Localized edema  Difficulty in walking, not elsewhere classified  Unsteadiness on feet     Problem List Patient Active Problem  List   Diagnosis Date Noted  . Postoperative anemia 06/25/2017  . Pulmonary embolism ([place]) 06/18/2017  . Primary localized osteoarthritis of right knee 06/10/2017  . Upper airway cough syndrome 08/29/2015  . Prostate cancer ([place]) 08/17/2015  . Exposure to asbestos 08/17/2015  . Personal history of colonic polyps 08/08/2014  . Joint pain 06/05/2014  . Vitamin D deficiency 06/05/2014  . BPH (benign prostatic hypertrophy) 06/01/2013  . Mild hyperlipidemia 06/01/2013  . Routine general medical examination at a health care facility 06/01/2013    Deniece Ree PT, DPT [facility] [address] [place], [place], [address] Phone: [phone]   Fax:  [phone]  Name: DENT PLANTZ MRN: [id number] Date of Birth: [birth year]

## 2017-07-10 ENCOUNTER — Other Ambulatory Visit: Payer: Medicare Other

## 2017-07-10 ENCOUNTER — Ambulatory Visit (HOSPITAL_COMMUNITY): Payer: Medicare Other | Admitting: Physical Therapy

## 2017-07-10 ENCOUNTER — Other Ambulatory Visit: Payer: Self-pay | Admitting: Family Medicine

## 2017-07-10 ENCOUNTER — Telehealth: Payer: Self-pay | Admitting: *Deleted

## 2017-07-10 ENCOUNTER — Other Ambulatory Visit: Payer: Self-pay | Admitting: *Deleted

## 2017-07-10 DIAGNOSIS — R262 Difficulty in walking, not elsewhere classified: Secondary | ICD-10-CM | POA: Diagnosis not present

## 2017-07-10 DIAGNOSIS — R2681 Unsteadiness on feet: Secondary | ICD-10-CM | POA: Diagnosis not present

## 2017-07-10 DIAGNOSIS — Z7901 Long term (current) use of anticoagulants: Secondary | ICD-10-CM

## 2017-07-10 DIAGNOSIS — R600 Localized edema: Secondary | ICD-10-CM

## 2017-07-10 DIAGNOSIS — R195 Other fecal abnormalities: Secondary | ICD-10-CM

## 2017-07-10 DIAGNOSIS — M25661 Stiffness of right knee, not elsewhere classified: Secondary | ICD-10-CM | POA: Diagnosis not present

## 2017-07-10 NOTE — Therapy (Signed)
[place], [place], [address] Phone: [phone]   Fax:  [phone]  Physical Therapy Treatment  Patient Details  Name: Luis Stewart MRN: [id number] Date of Birth: 12-30-1950 Referring Provider: Kathryne Hitch  Encounter Date: 07/10/2017      PT End of Session - 07/10/17 1044    Visit Number 8   Number of Visits 19   Date for PT Re-Evaluation 07/13/17   Authorization Type Medicare primary, BCBS supplementary    Authorization Time Period 06/22/17 to 08/03/17   Authorization - Visit Number 8   Authorization - Number of Visits 10   PT Start Time 0950   PT Stop Time 1028   PT Time Calculation (min) 38 min   Activity Tolerance Patient tolerated treatment well   Behavior During Therapy [facility] for tasks assessed/performed      Past Medical History:  Diagnosis Date  . Arthritis   . Asbestosis ([place])   . Cyst of kidney, acquired   . Headache   . History of fracture of leg    Right, childhood  . Hx of migraine headaches   . Hyperlipidemia   . Neuropathy   . PE (pulmonary thromboembolism) ([place])    After knee surgery  . Prostate cancer ([place]) 2015   Adenocarcinoma by biopsy    Past Surgical History:  Procedure Laterality Date  . BIOPSY PROSTATE  2003 and 2005  . COLONOSCOPY  11/28/2008   Dr. Rourk:internal hemorrhoids/tortus colon/ascending colon polyps, tubular adenomas  . COLONOSCOPY N/A 09/04/2014   Procedure: COLONOSCOPY;  Surgeon: Daneil Dolin, MD;  [facility];  Service: Endoscopy;  Laterality: N/A;  9:30  . TOTAL KNEE ARTHROPLASTY Right 06/10/2017  . TOTAL KNEE ARTHROPLASTY Right 06/10/2017   Procedure: TOTAL KNEE ARTHROPLASTY;  Surgeon: [name], MD;  Location: [place];  Service: Orthopedics;  Laterality: Right;    There were no vitals filed for this visit.      Subjective Assessment - 07/10/17 0952    Subjective Patient arrives stating that he is doing well but his balance  has seemed a little off recently    Pertinent History xarelto, history of PE, history of prostate cancer (just monitoring now), neuropathy    Patient Stated Goals get back to normal ASAP    Currently in Pain? No/denies            [facility] PT Assessment - 07/10/17 0001      AROM   Right Knee Flexion 103  99 sesated, 103 at best with knee drive                      [facility] Adult PT Treatment/Exercise - 07/10/17 0001      Knee/Hip Exercises: Stretches   Knee: Self-Stretch to increase Flexion Right   Knee: Self-Stretch Limitations 10x10 seconds significant PT overpressure    Gastroc Stretch Both;3 reps;30 seconds   Gastroc Stretch Limitations slantboard      Knee/Hip Exercises: Aerobic   Stationary Bike rocking seat 20 x10 minutes EOS   not included in billing      Knee/Hip Exercises: Seated   Other Seated Knee/Hip Exercises seated knee flexion stretch 15x15 seconds PT overpressure for last 5      Manual Therapy   Manual Therapy Joint mobilization   Manual therapy comments performed separately from all otehr skilled services    Joint Mobilization tib on femur flexion mobilization grade III, patella mobilization all directions  Balance Exercises - 07/10/17 1019      Balance Exercises: Standing   Tandem Stance Eyes open;Foam/compliant surface;3 reps;15 secs   SLS Eyes open;Solid surface;3 reps;10 secs   Tandem Gait Forward;4 reps  in parallel bars    Retro Gait 3 reps;Other (comment)  in parallel bars    Other Standing Exercises cone taps combo of 3/4 cones total            PT Education - 07/10/17 1043    Education provided Yes   Education Details lotion around knee but avoid areas of incision that still seem to be healing for now    Person(s) Educated Patient   Methods Explanation   Comprehension Verbalized understanding          PT Short Term Goals - 06/22/17 1042      PT SHORT TERM GOAL #1   Title Patient to demonstrate R knee  ROM as being 0-115 degrees in order to improve mechanics and assist in return to PLOF    Time 3   Period Weeks   Status New     PT SHORT TERM GOAL #2   Title Patient to be ambulating with no assistive device, consistent heel toe pattern, no unsteadiness to improve mobiilty and assist in return to PLOF    Time 3   Period Weeks   Status New     PT SHORT TERM GOAL #3   Title patient to be independent in correct performance of scar mobilization, patella mobility, and edema control in order to improve self-efficacy in managing condition    Time 3   Period Weeks   Status New     PT SHORT TERM GOAL #4   Title Patient to be compliant with correct performance of HEP, to be updated PRN    Time 1   Period Weeks   Status New           PT Long Term Goals - 06/22/17 1044      PT LONG TERM GOAL #1   Title Patient to demonstrate functional strength as being 5/5 in all tested muscle groups in order to improve giat and balance    Time 6   Period Weeks   Status New     PT LONG TERM GOAL #2   Title Patient to be able to complete TUG test in 10 seconds with no AD in order to show improved balance/reduced fall risk    Time 6   Period Weeks   Status New     PT LONG TERM GOAL #3   Title Patient to be able to perform mock-bowling exercise in clinic with no LOB and good form in order to ensure safety and success with individual attempt to return to bowling based tasks    Time 6   Period Weeks   Status New     PT LONG TERM GOAL #4   Title Patient to be able to ascend/descent full flight of stairs with U railing, no AD, reciprocal pattern, good eccentric control in order to show improved community and home access    Time 6   Period Weeks   Status New               Plan - 07/10/17 1044    Clinical Impression Statement Continued joint mobilization for flexion ROM today, also provided quite a bit of overpressure to flexion based activities to attempt to further improve range. Patient  able to reach 99 degrees flexion sitting and 103  during knee drive on box with PT overpressure today. Otherwise worked on functional balance based tasks as patient reports he has felt somewhat unsteady over the course of the past week.    Rehab Potential Good   Clinical Impairments Affecting Rehab Potential (+) motivated to participate in PT/return to PLOF, minimal PMH   PT Frequency 3x / week   PT Duration 6 weeks   PT Treatment/Interventions ADLs/Self Care Home Management;Biofeedback;Cryotherapy;DME Instruction;Gait training;Stair training;Functional mobility training;Therapeutic activities;Therapeutic exercise;Balance training;Neuromuscular re-education;Patient/family education;Manual techniques;Scar mobilization;Passive range of motion;Dry needling   PT Next Visit Plan continue all manual and bike for ROM. Continue strong overpressure with flexion activities.    PT Home Exercise Plan Eval: quad sets, heel slides, lateral weight shfits, seated knee flexion stretch; 7/18 prone quad stretch    Consulted and Agree with Plan of Care Patient      Patient will benefit from skilled therapeutic intervention in order to improve the following deficits and impairments:  Abnormal gait, Decreased skin integrity, Pain, Decreased mobility, Decreased scar mobility, Decreased activity tolerance, Decreased range of motion, Decreased strength, Hypomobility, Decreased balance, Difficulty walking, Increased edema, Impaired flexibility  Visit Diagnosis: Stiffness of right knee, not elsewhere classified  Localized edema  Difficulty in walking, not elsewhere classified  Unsteadiness on feet     Problem List Patient Active Problem List   Diagnosis Date Noted  . Postoperative anemia 06/25/2017  . Pulmonary embolism ([place]) 06/18/2017  . Primary localized osteoarthritis of right knee 06/10/2017  . Upper airway cough syndrome 08/29/2015  . Prostate cancer ([place]) 08/17/2015  . Exposure to asbestos 08/17/2015   . Personal history of colonic polyps 08/08/2014  . Joint pain 06/05/2014  . Vitamin D deficiency 06/05/2014  . BPH (benign prostatic hypertrophy) 06/01/2013  . Mild hyperlipidemia 06/01/2013  . Routine general medical examination at a health care facility 06/01/2013    Deniece Ree PT, DPT [facility] [address] [place], [place], [address] Phone: [phone]   Fax:  [phone]  Name: Luis Stewart MRN: [id number] Date of Birth: February 03, 1951

## 2017-07-10 NOTE — Telephone Encounter (Signed)
Received call from patient.   States that he has noted dark stools x5 days. States that he continues to use colace to soften stools. Denies ABD pain. Denies bright red stools. Patient is not on Fe.   MD made aware and new orders obtained for iFOBT x3. Orders placed and patient made aware. Will come to office to pick up kits.

## 2017-07-11 ENCOUNTER — Other Ambulatory Visit: Payer: Self-pay | Admitting: Family Medicine

## 2017-07-11 DIAGNOSIS — Z7901 Long term (current) use of anticoagulants: Secondary | ICD-10-CM | POA: Diagnosis not present

## 2017-07-11 DIAGNOSIS — R195 Other fecal abnormalities: Secondary | ICD-10-CM | POA: Diagnosis not present

## 2017-07-11 LAB — FECAL GLOBIN BY IMMUNOCHEMISTRY: Fecal Globin Immuno: DETECTED — AB

## 2017-07-12 ENCOUNTER — Other Ambulatory Visit: Payer: Self-pay | Admitting: Family Medicine

## 2017-07-12 DIAGNOSIS — R195 Other fecal abnormalities: Secondary | ICD-10-CM | POA: Diagnosis not present

## 2017-07-12 DIAGNOSIS — Z7901 Long term (current) use of anticoagulants: Secondary | ICD-10-CM | POA: Diagnosis not present

## 2017-07-13 ENCOUNTER — Ambulatory Visit (HOSPITAL_COMMUNITY): Payer: Medicare Other | Admitting: Physical Therapy

## 2017-07-13 ENCOUNTER — Other Ambulatory Visit: Payer: Medicare Other

## 2017-07-13 DIAGNOSIS — R600 Localized edema: Secondary | ICD-10-CM | POA: Diagnosis not present

## 2017-07-13 DIAGNOSIS — R2681 Unsteadiness on feet: Secondary | ICD-10-CM

## 2017-07-13 DIAGNOSIS — Z7901 Long term (current) use of anticoagulants: Secondary | ICD-10-CM

## 2017-07-13 DIAGNOSIS — R262 Difficulty in walking, not elsewhere classified: Secondary | ICD-10-CM

## 2017-07-13 DIAGNOSIS — M25661 Stiffness of right knee, not elsewhere classified: Secondary | ICD-10-CM | POA: Diagnosis not present

## 2017-07-13 DIAGNOSIS — R195 Other fecal abnormalities: Secondary | ICD-10-CM

## 2017-07-13 DIAGNOSIS — D649 Anemia, unspecified: Secondary | ICD-10-CM | POA: Diagnosis not present

## 2017-07-13 LAB — CBC WITH DIFFERENTIAL/PLATELET
BASOS PCT: 0 %
Basophils Absolute: 0 cells/uL (ref 0–200)
EOS PCT: 1 %
Eosinophils Absolute: 63 cells/uL (ref 15–500)
HCT: 29.2 % — ABNORMAL LOW (ref 38.5–50.0)
Hemoglobin: 9.3 g/dL — ABNORMAL LOW (ref 13.0–17.0)
LYMPHS PCT: 21 %
Lymphs Abs: 1323 cells/uL (ref 850–3900)
MCH: 30.1 pg (ref 27.0–33.0)
MCHC: 31.8 g/dL — ABNORMAL LOW (ref 32.0–36.0)
MCV: 94.5 fL (ref 80.0–100.0)
MONO ABS: 441 {cells}/uL (ref 200–950)
MONOS PCT: 7 %
MPV: 10.3 fL (ref 7.5–12.5)
Neutro Abs: 4473 cells/uL (ref 1500–7800)
Neutrophils Relative %: 71 %
PLATELETS: 202 10*3/uL (ref 140–400)
RBC: 3.09 MIL/uL — AB (ref 4.20–5.80)
RDW: 17.2 % — AB (ref 11.0–15.0)
WBC: 6.3 10*3/uL (ref 3.8–10.8)

## 2017-07-13 NOTE — Therapy (Signed)
[place], [place], [address] Phone: [phone]   Fax:  [phone]  Physical Therapy Treatment (Re-Assessment)  Patient Details  Name: Luis Stewart MRN: [id number] Date of Birth: 07-18-1951 Referring Provider: Kathryne Hitch  Encounter Date: 07/13/2017      PT End of Session - 07/13/17 1211    Visit Number 9   Number of Visits 19   Date for PT Re-Evaluation 08/03/17   Authorization Type Medicare primary, BCBS supplementary (G-codes done 9th visit)   Authorization Time Period 06/22/17 to 08/03/17   Authorization - Visit Number 9   Authorization - Number of Visits 19   PT Start Time 0947   PT Stop Time 1029   PT Time Calculation (min) 42 min   Activity Tolerance Patient tolerated treatment well   Behavior During Therapy [facility] for tasks assessed/performed      Past Medical History:  Diagnosis Date  . Arthritis   . Asbestosis ([place])   . Cyst of kidney, acquired   . Headache   . History of fracture of leg    Right, childhood  . Hx of migraine headaches   . Hyperlipidemia   . Neuropathy   . PE (pulmonary thromboembolism) ([place])    After knee surgery  . Prostate cancer ([place]) 2015   Adenocarcinoma by biopsy    Past Surgical History:  Procedure Laterality Date  . BIOPSY PROSTATE  2003 and 2005  . COLONOSCOPY  11/28/2008   Dr. Rourk:internal hemorrhoids/tortus colon/ascending colon polyps, tubular adenomas  . COLONOSCOPY N/A 09/04/2014   Procedure: COLONOSCOPY;  Surgeon: Daneil Dolin, MD;  [facility];  Service: Endoscopy;  Laterality: N/A;  9:30  . TOTAL KNEE ARTHROPLASTY Right 06/10/2017  . TOTAL KNEE ARTHROPLASTY Right 06/10/2017   Procedure: TOTAL KNEE ARTHROPLASTY;  Surgeon: [name], MD;  Location: [facility];  Service: Orthopedics;  Laterality: Right;    There were no vitals filed for this visit.      Subjective Assessment - 07/13/17 0949    Subjective patient arrives  stating he pushed himself this weekend, he walked around his entire neighborhood (about 1 hour) and he is feeling stiff and sore today. His MD is running some tests on his stool due to his bowels. He feels pretty good in general besdies some stomach irritation.    Pertinent History xarelto, history of PE, history of prostate cancer (just monitoring now), neuropathy    How long can you sit comfortably? 7/23- 60 minutes without elevating it    How long can you stand comfortably? 7/23- 20-30 minutes    How long can you walk comfortably? 7/3- 30-40 minutes    Patient Stated Goals get back to normal ASAP    Currently in Pain? Yes   Pain Score 3    Pain Location Knee   Pain Orientation Right   Pain Descriptors / Indicators Sore   Pain Type Surgical pain   Pain Radiating Towards none    Pain Onset 1 to 4 weeks ago   Pain Frequency Constant   Aggravating Factors  nothing    Pain Relieving Factors ice and elevation    Effect of Pain on Daily Activities moderate impact             [facility] PT Assessment - 07/13/17 0001      AROM   Right Knee Extension 3   Right Knee Flexion 90     Strength   Right Hip Flexion 5/5  Right Hip Extension 3/5   Right Hip ABduction 5/5   Left Hip Flexion 5/5   Left Hip Extension 3/5   Left Hip ABduction 5/5   Right Knee Flexion 4+/5   Right Knee Extension 5/5   Left Knee Flexion 5/5   Left Knee Extension 5/5   Right Ankle Dorsiflexion 5/5   Left Ankle Dorsiflexion 5/5     Ambulation/Gait   Gait Comments reduced R knee flexion, proximal weakness      6 minute walk test results    Aerobic Endurance Distance Walked 592   Endurance additional comments 3MWT no device      High Level Balance   High Level Balance Comments TUG 9.9 no device                      [facility] Adult PT Treatment/Exercise - 07/13/17 0001      Knee/Hip Exercises: Stretches   Knee: Self-Stretch to increase Flexion Right   Knee: Self-Stretch Limitations 15x15 second  holds   significant PT over pressure last 7-8 reps      Knee/Hip Exercises: Standing   Hip Extension Both;1 set;10 reps   Extension Limitations 3 second holds                 PT Education - 07/13/17 1023    Education provided Yes   Education Details progress towards goals and with PT; patella mobility and scar mobilization, retorgrade massage at home; POC moving forward    Person(s) Educated Patient   Methods Explanation;Demonstration   Comprehension Verbalized understanding;Returned demonstration;Need further instruction          PT Short Term Goals - 07/13/17 1010      PT SHORT TERM GOAL #1   Title Patient to demonstrate R knee ROM as being 0-115 degrees in order to improve mechanics and assist in return to PLOF    Baseline 7/23- at best 0 extension and 103 flexion but today 3-90 possibly due to increased edema    Time 3   Period Weeks   Status On-going     PT SHORT TERM GOAL #2   Title Patient to be ambulating with no assistive device, consistent heel toe pattern, no unsteadiness to improve mobiilty and assist in return to PLOF    Baseline 7/23- achieved    Time 3   Period Weeks   Status Achieved     PT SHORT TERM GOAL #3   Title patient to be independent in correct performance of scar mobilization, patella mobility, and edema control in order to improve self-efficacy in managing condition    Baseline 7/23- has been controlling edema I, discussed patella and scar mobility today    Time 3   Period Weeks   Status Partially Met     PT SHORT TERM GOAL #4   Title Patient to be compliant with correct performance of HEP, to be updated PRN    Baseline 7/23- compliant    Time 1   Period Weeks   Status Achieved           PT Long Term Goals - 07/13/17 1014      PT LONG TERM GOAL #1   Title Patient to demonstrate functional strength as being 5/5 in all tested muscle groups in order to improve giat and balance    Baseline 7/23- hip extensors remain weak     Time 6   Period Weeks   Status Partially Met     PT LONG  TERM GOAL #2   Title Patient to be able to complete TUG test in 10 seconds with no AD in order to show improved balance/reduced fall risk    Baseline 03-Aug-2023- 9.9 no device    Time 6   Period Weeks   Status Achieved     PT LONG TERM GOAL #3   Title Patient to be able to perform mock-bowling exercise in clinic with no LOB and good form in order to ensure safety and success with individual attempt to return to bowling based tasks    Baseline 2023/08/03- not attempted yet    Time 6   Period Weeks   Status On-going     PT LONG TERM GOAL #4   Title Patient to be able to ascend/descent full flight of stairs with U railing, no AD, reciprocal pattern, good eccentric control in order to show improved community and home access    Baseline 03-Aug-2023- limited by R knee stiffness but able to perform reciprocal pattern    Time 6   Period Weeks   Status On-going               Plan - 02-Aug-2017 1211    Clinical Impression Statement Re-assessment performed today. Patient appears to be making progress towards goals at this time, and has improved with gait mechanics/tolerance and balance; he does continue to demonstrate significant R knee ROM limitations however (note that his knee is more swollen than usual today due to activities over the weekend) as well as hip extensor weakness. Recommend continuation of skilled PT services to address functional deficits and facilitate optimal level of function moving forward.    Rehab Potential Good   Clinical Impairments Affecting Rehab Potential (+) motivated to participate in PT/return to PLOF, minimal PMH   PT Frequency 3x / week   PT Duration 3 weeks   PT Treatment/Interventions ADLs/Self Care Home Management;Biofeedback;Cryotherapy;DME Instruction;Gait training;Stair training;Functional mobility training;Therapeutic activities;Therapeutic exercise;Balance training;Neuromuscular re-education;Patient/family  education;Manual techniques;Scar mobilization;Passive range of motion;Dry needling   PT Next Visit Plan continue aggressive ROM work for knee flexion, aggressive manual; focus on knee flexion ROM and hip extension strength    PT Home Exercise Plan Eval: quad sets, heel slides, lateral weight shfits, seated knee flexion stretch; 7/18 prone quad stretch    Consulted and Agree with Plan of Care Patient      Patient will benefit from skilled therapeutic intervention in order to improve the following deficits and impairments:  Abnormal gait, Decreased skin integrity, Pain, Decreased mobility, Decreased scar mobility, Decreased activity tolerance, Decreased range of motion, Decreased strength, Hypomobility, Decreased balance, Difficulty walking, Increased edema, Impaired flexibility  Visit Diagnosis: Stiffness of right knee, not elsewhere classified  Localized edema  Difficulty in walking, not elsewhere classified  Unsteadiness on feet       G-Codes - 02-Aug-2017 1212    Functional Assessment Tool Used (Outpatient Only) Baed on skilled clinical assessment of ROM, strength, gait, balance    Functional Limitation Mobility: Walking and moving around   Mobility: Walking and Moving Around Current Status [phone]) At least 20 percent but less than 40 percent impaired, limited or restricted   Mobility: Walking and Moving Around Goal Status [phone]) At least 1 percent but less than 20 percent impaired, limited or restricted      Problem List Patient Active Problem List   Diagnosis Date Noted  . Postoperative anemia 06/25/2017  . Pulmonary embolism ([place]) 06/18/2017  . Primary localized osteoarthritis of right knee 06/10/2017  . Upper airway  cough syndrome 08/29/2015  . Prostate cancer ([place]) 08/17/2015  . Exposure to asbestos 08/17/2015  . Personal history of colonic polyps 08/08/2014  . Joint pain 06/05/2014  . Vitamin D deficiency 06/05/2014  . BPH (benign prostatic hypertrophy) 06/01/2013  .  Mild hyperlipidemia 06/01/2013  . Routine general medical examination at a health care facility 06/01/2013    Deniece Ree PT, DPT [facility] [address] [place], [place], [address] Phone: [phone]   Fax:  [phone]  Name: Luis Stewart MRN: [id number] Date of Birth: 05-26-1951

## 2017-07-14 ENCOUNTER — Telehealth: Payer: Self-pay

## 2017-07-14 LAB — FECAL GLOBIN BY IMMUNOCHEMISTRY
FECAL GLOBIN IMMUNO: DETECTED — AB
FECAL GLOBIN IMMUNO: DETECTED — AB

## 2017-07-14 NOTE — Telephone Encounter (Signed)
Received call from Dr.Laughlin's office, pt had a hgb of 11.5 on 06/25/17. Yesterday his hgb was 9.3 and he is heme positive. Per previous phone note from Dr.Durhams office, pt is having dark stools. Pt has appt for tomorrow at 8am with EG. Erline Levine tried to call pt -LMOM.

## 2017-07-14 NOTE — Telephone Encounter (Signed)
Call placed to patient on mobile and patient made aware of upcoming appointment.   Agreeable to time.

## 2017-07-15 ENCOUNTER — Ambulatory Visit (HOSPITAL_COMMUNITY): Payer: Medicare Other

## 2017-07-15 ENCOUNTER — Encounter: Payer: Self-pay | Admitting: Nurse Practitioner

## 2017-07-15 ENCOUNTER — Encounter (HOSPITAL_COMMUNITY): Payer: Self-pay | Admitting: Emergency Medicine

## 2017-07-15 ENCOUNTER — Ambulatory Visit (INDEPENDENT_AMBULATORY_CARE_PROVIDER_SITE_OTHER): Payer: Medicare Other | Admitting: Nurse Practitioner

## 2017-07-15 ENCOUNTER — Telehealth (HOSPITAL_COMMUNITY): Payer: Self-pay | Admitting: Family Medicine

## 2017-07-15 ENCOUNTER — Encounter: Payer: Self-pay | Admitting: Internal Medicine

## 2017-07-15 ENCOUNTER — Observation Stay (HOSPITAL_COMMUNITY)
Admission: EM | Admit: 2017-07-15 | Discharge: 2017-07-19 | Disposition: A | Discharge status: Home / Self Care | Payer: Medicare Other | Attending: Internal Medicine | Admitting: Internal Medicine

## 2017-07-15 VITALS — BP 119/68 | HR 83 | Temp 98.0°F | Ht 74.0 in | Wt 170.8 lb

## 2017-07-15 DIAGNOSIS — Z7901 Long term (current) use of anticoagulants: Secondary | ICD-10-CM | POA: Insufficient documentation

## 2017-07-15 DIAGNOSIS — K921 Melena: Secondary | ICD-10-CM | POA: Diagnosis present

## 2017-07-15 DIAGNOSIS — I2601 Septic pulmonary embolism with acute cor pulmonale: Secondary | ICD-10-CM | POA: Diagnosis not present

## 2017-07-15 DIAGNOSIS — Z96651 Presence of right artificial knee joint: Secondary | ICD-10-CM | POA: Diagnosis not present

## 2017-07-15 DIAGNOSIS — E785 Hyperlipidemia, unspecified: Secondary | ICD-10-CM | POA: Diagnosis not present

## 2017-07-15 DIAGNOSIS — Z86711 Personal history of pulmonary embolism: Secondary | ICD-10-CM | POA: Diagnosis present

## 2017-07-15 DIAGNOSIS — Z6821 Body mass index (BMI) 21.0-21.9, adult: Secondary | ICD-10-CM | POA: Diagnosis not present

## 2017-07-15 DIAGNOSIS — J61 Pneumoconiosis due to asbestos and other mineral fibers: Secondary | ICD-10-CM | POA: Insufficient documentation

## 2017-07-15 DIAGNOSIS — N400 Enlarged prostate without lower urinary tract symptoms: Secondary | ICD-10-CM | POA: Insufficient documentation

## 2017-07-15 DIAGNOSIS — R634 Abnormal weight loss: Secondary | ICD-10-CM | POA: Diagnosis not present

## 2017-07-15 DIAGNOSIS — G629 Polyneuropathy, unspecified: Secondary | ICD-10-CM | POA: Insufficient documentation

## 2017-07-15 DIAGNOSIS — M1990 Unspecified osteoarthritis, unspecified site: Secondary | ICD-10-CM | POA: Insufficient documentation

## 2017-07-15 DIAGNOSIS — Z86718 Personal history of other venous thrombosis and embolism: Secondary | ICD-10-CM | POA: Diagnosis not present

## 2017-07-15 DIAGNOSIS — I2699 Other pulmonary embolism without acute cor pulmonale: Secondary | ICD-10-CM

## 2017-07-15 DIAGNOSIS — K648 Other hemorrhoids: Secondary | ICD-10-CM | POA: Insufficient documentation

## 2017-07-15 DIAGNOSIS — Z8249 Family history of ischemic heart disease and other diseases of the circulatory system: Secondary | ICD-10-CM | POA: Insufficient documentation

## 2017-07-15 DIAGNOSIS — Z833 Family history of diabetes mellitus: Secondary | ICD-10-CM | POA: Insufficient documentation

## 2017-07-15 DIAGNOSIS — E538 Deficiency of other specified B group vitamins: Secondary | ICD-10-CM | POA: Diagnosis not present

## 2017-07-15 DIAGNOSIS — R911 Solitary pulmonary nodule: Secondary | ICD-10-CM | POA: Diagnosis not present

## 2017-07-15 DIAGNOSIS — Z8719 Personal history of other diseases of the digestive system: Secondary | ICD-10-CM | POA: Insufficient documentation

## 2017-07-15 DIAGNOSIS — D62 Acute posthemorrhagic anemia: Principal | ICD-10-CM | POA: Diagnosis present

## 2017-07-15 DIAGNOSIS — K625 Hemorrhage of anus and rectum: Secondary | ICD-10-CM | POA: Diagnosis not present

## 2017-07-15 DIAGNOSIS — Z818 Family history of other mental and behavioral disorders: Secondary | ICD-10-CM | POA: Insufficient documentation

## 2017-07-15 DIAGNOSIS — M889 Osteitis deformans of unspecified bone: Secondary | ICD-10-CM | POA: Diagnosis not present

## 2017-07-15 DIAGNOSIS — Z8261 Family history of arthritis: Secondary | ICD-10-CM | POA: Insufficient documentation

## 2017-07-15 DIAGNOSIS — Z8546 Personal history of malignant neoplasm of prostate: Secondary | ICD-10-CM | POA: Insufficient documentation

## 2017-07-15 DIAGNOSIS — Q438 Other specified congenital malformations of intestine: Secondary | ICD-10-CM | POA: Insufficient documentation

## 2017-07-15 DIAGNOSIS — G43909 Migraine, unspecified, not intractable, without status migrainosus: Secondary | ICD-10-CM | POA: Diagnosis not present

## 2017-07-15 DIAGNOSIS — K5900 Constipation, unspecified: Secondary | ICD-10-CM | POA: Diagnosis not present

## 2017-07-15 DIAGNOSIS — R195 Other fecal abnormalities: Secondary | ICD-10-CM

## 2017-07-15 DIAGNOSIS — I82401 Acute embolism and thrombosis of unspecified deep veins of right lower extremity: Secondary | ICD-10-CM

## 2017-07-15 DIAGNOSIS — K2970 Gastritis, unspecified, without bleeding: Secondary | ICD-10-CM | POA: Insufficient documentation

## 2017-07-15 DIAGNOSIS — D120 Benign neoplasm of cecum: Secondary | ICD-10-CM | POA: Diagnosis not present

## 2017-07-15 DIAGNOSIS — I700 Atherosclerosis of aorta: Secondary | ICD-10-CM | POA: Diagnosis not present

## 2017-07-15 DIAGNOSIS — N281 Cyst of kidney, acquired: Secondary | ICD-10-CM | POA: Insufficient documentation

## 2017-07-15 DIAGNOSIS — Z79899 Other long term (current) drug therapy: Secondary | ICD-10-CM | POA: Insufficient documentation

## 2017-07-15 DIAGNOSIS — K644 Residual hemorrhoidal skin tags: Secondary | ICD-10-CM | POA: Insufficient documentation

## 2017-07-15 DIAGNOSIS — I82409 Acute embolism and thrombosis of unspecified deep veins of unspecified lower extremity: Secondary | ICD-10-CM

## 2017-07-15 DIAGNOSIS — Z8489 Family history of other specified conditions: Secondary | ICD-10-CM | POA: Insufficient documentation

## 2017-07-15 LAB — CBC WITH DIFFERENTIAL/PLATELET
BASOS ABS: 0 10*3/uL (ref 0.0–0.1)
BASOS PCT: 0 %
Eosinophils Absolute: 0.1 10*3/uL (ref 0.0–0.7)
Eosinophils Relative: 2 %
HEMATOCRIT: 27.8 % — AB (ref 39.0–52.0)
HEMOGLOBIN: 9.2 g/dL — AB (ref 13.0–17.0)
LYMPHS PCT: 25 %
Lymphs Abs: 1.5 10*3/uL (ref 0.7–4.0)
MCH: 30.6 pg (ref 26.0–34.0)
MCHC: 33.1 g/dL (ref 30.0–36.0)
MCV: 92.4 fL (ref 78.0–100.0)
Monocytes Absolute: 0.4 10*3/uL (ref 0.1–1.0)
Monocytes Relative: 6 %
NEUTROS ABS: 4 10*3/uL (ref 1.7–7.7)
NEUTROS PCT: 67 %
Platelets: 203 10*3/uL (ref 150–400)
RBC: 3.01 MIL/uL — ABNORMAL LOW (ref 4.22–5.81)
RDW: 17.2 % — ABNORMAL HIGH (ref 11.5–15.5)
WBC: 6 10*3/uL (ref 4.0–10.5)

## 2017-07-15 LAB — COMPREHENSIVE METABOLIC PANEL
ALBUMIN: 3.4 g/dL — AB (ref 3.5–5.0)
ALK PHOS: 74 U/L (ref 38–126)
ALT: 10 U/L — AB (ref 17–63)
AST: 17 U/L (ref 15–41)
Anion gap: 8 (ref 5–15)
BILIRUBIN TOTAL: 0.6 mg/dL (ref 0.3–1.2)
BUN: 17 mg/dL (ref 6–20)
CALCIUM: 9 mg/dL (ref 8.9–10.3)
CO2: 28 mmol/L (ref 22–32)
CREATININE: 1.04 mg/dL (ref 0.61–1.24)
Chloride: 104 mmol/L (ref 101–111)
GFR calc Af Amer: >60 mL/min (ref >60)
GLUCOSE: 108 mg/dL — AB (ref 65–99)
POTASSIUM: 3.9 mmol/L (ref 3.5–5.1)
Sodium: 140 mmol/L (ref 135–145)
TOTAL PROTEIN: 6.7 g/dL (ref 6.5–8.1)

## 2017-07-15 LAB — CBC
HCT: 24.1 % — ABNORMAL LOW (ref 39.0–52.0)
HEMOGLOBIN: 8.1 g/dL — AB (ref 13.0–17.0)
MCH: 31.2 pg (ref 26.0–34.0)
MCHC: 33.6 g/dL (ref 30.0–36.0)
MCV: 92.7 fL (ref 78.0–100.0)
Platelets: 187 10*3/uL (ref 150–400)
RBC: 2.6 MIL/uL — AB (ref 4.22–5.81)
RDW: 17.4 % — ABNORMAL HIGH (ref 11.5–15.5)
WBC: 4.7 10*3/uL (ref 4.0–10.5)

## 2017-07-15 LAB — APTT
APTT: 70 s — AB (ref 24–36)
aPTT: 34 seconds (ref 24–36)

## 2017-07-15 LAB — RETICULOCYTES
RBC.: 2.61 MIL/uL — AB (ref 4.22–5.81)
Retic Count, Absolute: 122.7 10*3/uL (ref 19.0–186.0)
Retic Ct Pct: 4.7 % — ABNORMAL HIGH (ref 0.4–3.1)

## 2017-07-15 LAB — HEPARIN LEVEL (UNFRACTIONATED)
HEPARIN UNFRACTIONATED: 0.28 [IU]/mL — AB (ref 0.30–0.70)
Heparin Unfractionated: <0.1 IU/mL — ABNORMAL LOW (ref 0.30–0.70)

## 2017-07-15 LAB — POC OCCULT BLOOD, ED: Fecal Occult Bld: POSITIVE — AB

## 2017-07-15 MED ORDER — HEPARIN (PORCINE) IN NACL 100-0.45 UNIT/ML-% IJ SOLN: 1200.0000 [IU]/h | INTRAMUSCULAR | Status: DC

## 2017-07-15 MED ORDER — SODIUM CHLORIDE 0.9 % IV SOLN
INTRAVENOUS | Status: DC
  Administered 2017-07-15 – 2017-07-16 (×2): via INTRAVENOUS

## 2017-07-15 MED ORDER — SENNOSIDES-DOCUSATE SODIUM 8.6-50 MG PO TABS: 1.0000 | ORAL_TABLET | Freq: Every evening | ORAL | Status: DC | PRN

## 2017-07-15 MED ORDER — PANTOPRAZOLE SODIUM 40 MG IV SOLR
40.0000 mg | Freq: Two times a day (BID) | INTRAVENOUS | Status: DC
  Administered 2017-07-15 – 2017-07-16 (×2): 40 mg via INTRAVENOUS
  Filled 2017-07-15 (×3): qty 40

## 2017-07-15 MED ORDER — HEPARIN BOLUS VIA INFUSION
2000.0000 [IU] | Freq: Once | INTRAVENOUS | Status: DC
  Filled 2017-07-15: qty 2000

## 2017-07-15 MED ORDER — SODIUM CHLORIDE 0.9 % IV SOLN
INTRAVENOUS | Status: DC
  Administered 2017-07-15: 21:00:00 via INTRAVENOUS

## 2017-07-15 MED ORDER — PANTOPRAZOLE SODIUM 40 MG IV SOLR
40.0000 mg | Freq: Once | INTRAVENOUS | Status: AC
  Administered 2017-07-15: 40 mg via INTRAVENOUS
  Filled 2017-07-15: qty 40

## 2017-07-15 MED ORDER — SODIUM CHLORIDE 0.9 % IV BOLUS (SEPSIS)
1000.0000 mL | Freq: Once | INTRAVENOUS | Status: AC
  Administered 2017-07-15: 1000 mL via INTRAVENOUS

## 2017-07-15 MED ORDER — PANTOPRAZOLE SODIUM 40 MG IV SOLR: 40.0000 mg | Freq: Two times a day (BID) | INTRAVENOUS | Status: DC

## 2017-07-15 MED ORDER — HEPARIN (PORCINE) IN NACL 100-0.45 UNIT/ML-% IJ SOLN
1000.0000 [IU]/h | INTRAMUSCULAR | Status: AC
  Administered 2017-07-15: 1200 [IU]/h via INTRAVENOUS
  Administered 2017-07-16: 1000 [IU]/h via INTRAVENOUS
  Filled 2017-07-15 (×2): qty 250

## 2017-07-15 MED ORDER — ONDANSETRON HCL 4 MG PO TABS: 4.0000 mg | ORAL_TABLET | Freq: Four times a day (QID) | ORAL | Status: DC | PRN

## 2017-07-15 MED ORDER — ONDANSETRON HCL 4 MG/2ML IJ SOLN: 4.0000 mg | Freq: Four times a day (QID) | INTRAMUSCULAR | Status: DC | PRN

## 2017-07-15 NOTE — Patient Instructions (Signed)
1. Proceed to the emergency department for further evaluation. 2. Return for follow-up in 3 months. 3. Further recommendations to follow.

## 2017-07-15 NOTE — ED Provider Notes (Signed)
[facility] DEPT Provider Note   CSN: [id number] Arrival date & time: 07/15/17  3220     History   Chief Complaint Chief Complaint  Patient presents with  . Melena    HPI CORY KITT is a 66 y.o. male.  HPI  HERMILO DUTTER is a 66 y.o. male who presents to the Emergency Department complaining of dark tarry looking stools for 2 weeks.  He is status post total right knee arthroplasty on 06/10/2017 he was discharged on 325 mg aspirin daily after his surgery but subsequently developed a PE and DVT. He was then started on Xarelto on 06/18/2017.  He has been evaluated by his PCP and noted to have heme-positive fecal testing. Patient's hemoglobin on 06/25/2017 was noted to be 11.5 When rechecked on 07/13/2017 it was noted to have dropped to 9.3. He was referred to GI and seen earlier and advised to come to ER for further evaluation.  He denies dizziness upon standing, weakness, chest pain, shortness of breath, abdominal pain, nausea, vomiting, alcohol use, and denies ASA or ibuprofen use.     Past Medical History:  Diagnosis Date  . Arthritis   . Asbestosis ([place])   . Cyst of kidney, acquired   . Headache   . History of fracture of leg    Right, childhood  . Hx of migraine headaches   . Hyperlipidemia   . Neuropathy   . PE (pulmonary thromboembolism) ([place])    After knee surgery  . Prostate cancer ([place]) 2015   Adenocarcinoma by biopsy    Patient Active Problem List   Diagnosis Date Noted  . Heme + stool 07/15/2017  . Constipation 07/15/2017  . Postoperative anemia 06/25/2017  . Pulmonary embolism ([place]) 06/18/2017  . Primary localized osteoarthritis of right knee 06/10/2017  . Upper airway cough syndrome 08/29/2015  . Prostate cancer ([place]) 08/17/2015  . Exposure to asbestos 08/17/2015  . Personal history of colonic polyps 08/08/2014  . Joint pain 06/05/2014  . Vitamin D deficiency 06/05/2014  . BPH (benign prostatic hypertrophy) 06/01/2013  . Mild  hyperlipidemia 06/01/2013  . Routine general medical examination at a health care facility 06/01/2013    Past Surgical History:  Procedure Laterality Date  . BIOPSY PROSTATE  2003 and 2005  . COLONOSCOPY  11/28/2008   Dr. Rourk:internal hemorrhoids/tortus colon/ascending colon polyps, tubular adenomas  . COLONOSCOPY N/A 09/04/2014   Procedure: COLONOSCOPY;  Surgeon: Daneil Dolin, MD;  [facility];  Service: Endoscopy;  Laterality: N/A;  9:30  . TOTAL KNEE ARTHROPLASTY Right 06/10/2017  . TOTAL KNEE ARTHROPLASTY Right 06/10/2017   Procedure: TOTAL KNEE ARTHROPLASTY;  Surgeon: [name], MD;  Location: [facility];  Service: Orthopedics;  Laterality: Right;       Home Medications    Prior to Admission medications   Medication Sig Start Date End Date Taking? Authorizing Provider  Cholecalciferol (VITAMIN D) 2000 units CAPS Take 2,000 Units by mouth 3 (three) times a week.     [provider]  docusate sodium (COLACE) 100 MG capsule Take 200 mg by mouth daily.    [provider]  Multiple Vitamin (MULTIVITAMIN WITH MINERALS) TABS tablet Take 1 tablet by mouth daily. Centrum Silver for Men    [provider]  rivaroxaban (XARELTO) 20 MG TABS tablet Take 1 tablet (20 mg total) by mouth daily with supper. Patient not taking: Reported on 07/15/2017 06/25/17   Alycia Rossetti, MD    Family History Family History  Problem  Relation Age of Onset  . Diabetes Mother   . Hypertension Mother   . Obesity Mother   . Heart disease Mother   . Mental illness Mother   . Hypertension Sister   . Obesity Sister   . Arthritis Sister   . Deep vein thrombosis Father   . Dementia Father   . Heart disease Father        CABG  . Colon cancer Neg Hx     Social History Social History  Substance Use Topics  . Smoking status: Never Smoker  . Smokeless tobacco: Never Used  . Alcohol use No     Allergies   Sulfa antibiotics   Review of Systems Review of  Systems  Constitutional: Negative for appetite change, fatigue and fever.  Respiratory: Negative for chest tightness, shortness of breath and wheezing.   Cardiovascular: Negative for chest pain and palpitations.  Gastrointestinal: Positive for blood in stool. Negative for abdominal pain, nausea, rectal pain and vomiting.  Genitourinary: Negative for difficulty urinating and dysuria.  Musculoskeletal: Negative for arthralgias and myalgias.  Skin: Negative for color change.  Neurological: Negative for dizziness, syncope, weakness, light-headedness and numbness.  Psychiatric/Behavioral: Negative for confusion.     Physical Exam Updated Vital Signs BP (!) 143/77 (BP Location: Left Arm)   Pulse 74   Temp 98.3 F (36.8 C) (Oral)   Resp 16   Ht 6\' 2"  (1.88 m)   Wt 77.5 kg (170 lb 12 oz)   SpO2 100%   BMI 21.92 kg/m   Physical Exam  Constitutional: He is oriented to person, place, and time. He appears well-developed and well-nourished. No distress.  HENT:  Head: Atraumatic.  Mouth/Throat: Oropharynx is clear and moist.  Eyes: Pupils are equal, round, and reactive to light. Conjunctivae and EOM are normal.  Neck: Normal range of motion.  Cardiovascular: Normal rate, regular rhythm and intact distal pulses.   Pulmonary/Chest: Effort normal and breath sounds normal. No respiratory distress.  Abdominal: Soft. He exhibits no distension. There is no tenderness.  Genitourinary:  Genitourinary Comments: Black, heme positive stools.  No palpable rectal masses or tenderness.   Musculoskeletal: Normal range of motion.  Neurological: He is alert and oriented to person, place, and time. No cranial nerve deficit or sensory deficit. Coordination normal.  Skin: Skin is warm. Capillary refill takes less than 2 seconds. No pallor.  Psychiatric: He has a normal mood and affect.  Nursing note and vitals reviewed.    ED Treatments / Results  Labs (all labs ordered are listed, but only abnormal  results are displayed) Labs Reviewed  CBC WITH DIFFERENTIAL/PLATELET - Abnormal; Notable for the following:       Result Value   RBC 3.01 (*)    Hemoglobin 9.2 (*)    HCT 27.8 (*)    RDW 17.2 (*)    All other components within normal limits  COMPREHENSIVE METABOLIC PANEL - Abnormal; Notable for the following:    Glucose, Bld 108 (*)    Albumin 3.4 (*)    ALT 10 (*)    All other components within normal limits  POC OCCULT BLOOD, ED - Abnormal; Notable for the following:    Fecal Occult Bld POSITIVE (*)    All other components within normal limits    EKG  EKG Interpretation None       Radiology No results found.  Procedures Procedures (including critical care time)  Medications Ordered in ED Medications  sodium chloride 0.9 % bolus 1,000 mL (  not administered)  pantoprazole (PROTONIX) injection 40 mg (not administered)     Initial Impression / Assessment and Plan / ED Course  I have reviewed the triage vital signs and the nursing notes.  Pertinent labs & imaging results that were available during my care of the patient were reviewed by me and considered in my medical decision making (see chart for details).      Pt well appearing, hemodynamically stable.  Dr. Oneida Alar aware of pt,  will consult.   [place] Oneida Alar.  Requested that pt be admitted by hospitalist and she will consult.    [place] Dr. Jerilee Hoh, who agrees to admit.    Final Clinical Impressions(s) / ED Diagnoses   Final diagnoses:  Melena    New Prescriptions New Prescriptions   No medications on file     Bufford Lope 07/15/17 1225    Milton Ferguson, MD 07/15/17 [phone]

## 2017-07-15 NOTE — Assessment & Plan Note (Signed)
The patient has symptoms of mild constipation as per history of present illness. He is not wanting prescription of medicine at this time. Colace has helped. Takes Benefiber intermittently when his symptoms are slightly worse. Recommended Benefiber on a regular basis either once a day or once every other day to promote good bowel movements. Return for follow-up in 3 months.

## 2017-07-15 NOTE — H&P (Signed)
History and Physical    Luis Stewart VZD:[id number] DOB: May 25, 1951 DOA: 07/15/2017  Referring MD/NP/PA: Kem Parkinson, ED PA PCP: Alycia Rossetti, MD  Patient coming from: GI office  Chief Complaint: Melena  HPI: Luis Stewart is a 66 y.o. male who had a knee replacement at the end of June, subsequently diagnosed with a PE on June 28 and started on Xarelto. About 2 weeks ago started noticing dark stools. Went to see his PCP who referred him to GI office today. GI noticed a couple gram drop in hemoglobin and sent him to the hospital for endoscopy. His hemoglobin was 11.5 on July 5 and has dropped to 9.2 today. Vital signs are within normal limits, rest of labs are within normal limits. Has already been seen by GI with plans for endoscopy in a.m. Admission requested.  Past Medical/Surgical History: Past Medical History:  Diagnosis Date  . Arthritis   . Asbestosis ([place])   . Cyst of kidney, acquired   . Headache   . History of fracture of leg    Right, childhood  . Hx of migraine headaches   . Hyperlipidemia   . Neuropathy   . PE (pulmonary thromboembolism) ([place])    After knee surgery  . Prostate cancer ([place]) 2015   Adenocarcinoma by biopsy    Past Surgical History:  Procedure Laterality Date  . BIOPSY PROSTATE  2003 and 2005  . COLONOSCOPY  11/28/2008   Dr. Rourk:internal hemorrhoids/tortus colon/ascending colon polyps, tubular adenomas  . COLONOSCOPY N/A 09/04/2014   Procedure: COLONOSCOPY;  Surgeon: Daneil Dolin, MD;  [facility];  Service: Endoscopy;  Laterality: N/A;  9:30  . TOTAL KNEE ARTHROPLASTY Right 06/10/2017  . TOTAL KNEE ARTHROPLASTY Right 06/10/2017   Procedure: TOTAL KNEE ARTHROPLASTY;  Surgeon: [name], MD;  Location: [facility];  Service: Orthopedics;  Laterality: Right;    Social History:  reports that he has never smoked. He has never used smokeless tobacco. He reports that he does not drink alcohol or use  drugs.  Allergies: Allergies  Allergen Reactions  . Sulfa Antibiotics Other (See Comments)    Unknown reaction. Childhood reaction    Family History:  Family History  Problem Relation Age of Onset  . Diabetes Mother   . Hypertension Mother   . Obesity Mother   . Heart disease Mother   . Mental illness Mother   . Hypertension Sister   . Obesity Sister   . Arthritis Sister   . Deep vein thrombosis Father   . Dementia Father   . Heart disease Father        CABG  . Colon cancer Neg Hx     Prior to Admission medications   Medication Sig Start Date End Date Taking? Authorizing Provider  acetaminophen (TYLENOL) 500 MG tablet Take 1,000 mg by mouth every 6 (six) hours as needed for mild pain or moderate pain.   Yes [provider]  Cholecalciferol (VITAMIN D) 2000 units CAPS Take 2,000 Units by mouth 3 (three) times a week.    Yes [provider]  docusate sodium (COLACE) 100 MG capsule Take 200 mg by mouth daily.   Yes [provider]  Multiple Vitamin (MULTIVITAMIN WITH MINERALS) TABS tablet Take 1 tablet by mouth daily. Centrum Silver for Men   Yes [provider]    Review of Systems:  Constitutional: Denies fever, chills, diaphoresis, appetite change and fatigue.  HEENT: Denies photophobia, eye pain, redness, hearing loss, ear pain,  congestion, sore throat, rhinorrhea, sneezing, mouth sores, trouble swallowing, neck pain, neck stiffness and tinnitus.   Respiratory: Denies SOB, DOE, cough, chest tightness,  and wheezing.   Cardiovascular: Denies chest pain, palpitations and leg swelling.  Gastrointestinal: Denies nausea, vomiting, abdominal pain, diarrhea, constipation, and abdominal distention.  Genitourinary: Denies dysuria, urgency, frequency, hematuria, flank pain and difficulty urinating.  Endocrine: Denies: hot or cold intolerance, sweats, changes in hair or nails, polyuria, polydipsia. Musculoskeletal: Denies myalgias, back pain, joint  swelling, arthralgias and gait problem.  Skin: Denies pallor, rash and wound.  Neurological: Denies dizziness, seizures, syncope, weakness, light-headedness, numbness and headaches.  Hematological: Denies adenopathy. Easy bruising, personal or family bleeding history  Psychiatric/Behavioral: Denies suicidal ideation, mood changes, confusion, nervousness, sleep disturbance and agitation    Physical Exam: Vitals:   07/15/17 0925 07/15/17 1000 07/15/17 1100 07/15/17 1300  BP: (!) 143/77 101/60 113/62 (!) 101/48  Pulse: 74 70 78 71  Resp: 16 16 12 16   Temp: 98.3 F (36.8 C)   98.3 F (36.8 C)  TempSrc: Oral   Oral  SpO2: 100% 100% 99% 100%  Weight:    77.1 kg (170 lb)  Height:    6\' 2"  (1.88 m)     Constitutional: NAD, calm, comfortable Eyes: PERRL, lids and conjunctivae normal ENMT: Mucous membranes are moist. Posterior pharynx clear of any exudate or lesions.Normal dentition.  Neck: normal, supple, no masses, no thyromegaly Respiratory: clear to auscultation bilaterally, no wheezing, no crackles. Normal respiratory effort. No accessory muscle use.  Cardiovascular: Regular rate and rhythm, no murmurs / rubs / gallops. No extremity edema. 2+ pedal pulses. No carotid bruits.  Abdomen: no tenderness, no masses palpated. No hepatosplenomegaly. Bowel sounds positive.  Musculoskeletal: no clubbing / cyanosis. No joint deformity upper and lower extremities. Good ROM, no contractures. Normal muscle tone.  Skin: no rashes, lesions, ulcers. No induration Neurologic: CN 2-12 grossly intact. Sensation intact, DTR normal. Strength 5/5 in all 4.  Psychiatric: Normal judgment and insight. Alert and oriented x 3. Normal mood.    Labs on Admission: I have personally reviewed the following labs and imaging studies  CBC:  Recent Labs Lab 07/13/17 1259 07/15/17 1028  WBC 6.3 6.0  NEUTROABS 4,473 4.0  HGB 9.3* 9.2*  HCT 29.2* 27.8*  MCV 94.5 92.4  PLT 202 096   Basic Metabolic  Panel:  Recent Labs Lab 07/15/17 1028  NA 140  K 3.9  CL 104  CO2 28  GLUCOSE 108*  BUN 17  CREATININE 1.04  CALCIUM 9.0   GFR: Estimated Creatinine Clearance: 76.2 mL/min (by C-G formula based on SCr of 1.04 mg/dL). Liver Function Tests:  Recent Labs Lab 07/15/17 1028  AST 17  ALT 10*  ALKPHOS 74  BILITOT 0.6  PROT 6.7  ALBUMIN 3.4*   No results for input(s): LIPASE, AMYLASE in the last 168 hours. No results for input(s): AMMONIA in the last 168 hours. Coagulation Profile: No results for input(s): INR, PROTIME in the last 168 hours. Cardiac Enzymes: No results for input(s): CKTOTAL, CKMB, CKMBINDEX, TROPONINI in the last 168 hours. BNP (last 3 results) No results for input(s): PROBNP in the last 8760 hours. HbA1C: No results for input(s): HGBA1C in the last 72 hours. CBG: No results for input(s): GLUCAP in the last 168 hours. Lipid Profile: No results for input(s): CHOL, HDL, LDLCALC, TRIG, CHOLHDL, LDLDIRECT in the last 72 hours. Thyroid Function Tests: No results for input(s): TSH, T4TOTAL, FREET4, T3FREE, THYROIDAB in the last 72 hours. Anemia  Panel: No results for input(s): VITAMINB12, FOLATE, FERRITIN, TIBC, IRON, RETICCTPCT in the last 72 hours. Urine analysis:    Component Value Date/Time   COLORURINE YELLOW 06/17/2017 2352   APPEARANCEUR CLEAR 06/17/2017 2352   LABSPEC 1.013 06/17/2017 2352   PHURINE 5.0 06/17/2017 2352   GLUCOSEU NEGATIVE 06/17/2017 2352   HGBUR NEGATIVE 06/17/2017 2352   BILIRUBINUR NEGATIVE 06/17/2017 2352   KETONESUR NEGATIVE 06/17/2017 2352   PROTEINUR NEGATIVE 06/17/2017 2352   NITRITE NEGATIVE 06/17/2017 2352   LEUKOCYTESUR NEGATIVE 06/17/2017 2352   Sepsis Labs: @LABRCNTIP (procalcitonin:4,lacticidven:4) )No results found for this or any previous visit (from the past 240 hour(s)).   Radiological Exams on Admission: No results found.  EKG: Independently reviewed. Sinus rhythm, LVH, no acute ischemic  abnormalities  Assessment/Plan Principal Problem:   Acute blood loss anemia Active Problems:   Pulmonary embolism (HCC)   Melena    Melena -Cause unclear. -Start Protonix IV twice a day. -As per GI plan for EGD in a.m.  Acute blood loss anemia -Due to acute GI bleed. -Check anemia panel, no plans for transfusion at present unless hemoglobin were to drop below 7 acutely.  Recent PE -GI has already discussed plan with hematology. -Xarelto is on hold pending results of EGD, start on heparin drip now.   DVT prophylaxis: IV heparin  Code Status: Full code  Family Communication: Friends at bedside updated on plan of care and all questions answered  Disposition Plan: For EGD in a.m., anticipate discharge home in 24-48 hours  Consults called: GI  Admission status: Observation   Time Spent: 90 minutes   Lelon Frohlich MD [facility] Pager [phone]  If 7PM-7AM, please contact night-coverage [email or web address] Password [facility]  07/15/2017, 4:29 PM

## 2017-07-15 NOTE — Progress Notes (Addendum)
REVIEWED. HOLD HEPARIN 4 HRS PRIOR TO EGD. Heart health diet now, full liquids after 9 pm, then NPO AFTER 0830 JUL 26.  Patient sent to ED from clinic at [facility] due to melena and several gram drop in Hgb as outpatient on Xarelto. Recently diagnosed with DVT/PE (06/18/17) after total right knee approximately a week prior. Last dose of Xarelto was 7/23. Will be on hold for 48 hours by this evening at [facility] to review perioperative management of anticoagulation due to need for EGD and less than a month ago diagnosed with DVT/PE.   IF EGD done today, as it has been on hold at least 24 hours (over 24 at this point), no need for heparin drip.  However, if EGD is performed tomorrow, will need heparin drip in the interim, which may be stopped prior to procedure tomorrow.    EGD planned for 7/26. Will consult pharmacy for heparin drip to start today. Continue to hold Xarelto.   Annitta Needs, PhD, ANP-BC [facility]

## 2017-07-15 NOTE — ED Triage Notes (Signed)
Pt sent from GI office for dark, tarry stools x 2 weeks with drop in HGB.  Was on Xarelto for DVT occurring last month post knee surgery.  Has not taken x 2 days.

## 2017-07-15 NOTE — Progress Notes (Signed)
cc'ed to pcp °

## 2017-07-15 NOTE — Progress Notes (Signed)
Referring Provider: Alycia Rossetti, MD Primary Care Physician:  Alycia Rossetti, MD Primary GI:  Dr. Gala Romney  Chief Complaint  Patient presents with  . Melena    HPI:   Luis Stewart is a 66 y.o. male who presents For presumed melena due to black stools. The patient has not been seen in our office since 08/08/2014 for personal history of colon polyps. At that time it was noted he had occasional lower abdominal discomfort which improved after a bowel movement, sometimes constipated but declining prescriptive medications. No upper GI symptoms and no bleeding at that time. Recommended colonoscopy due to need for surveillance at that time.  Last colonoscopy completed 09/04/2014 which found normal colonoscopy and recommended repeat exam in 5 years.  CBC completed 07/13/2017 found a decline in hemoglobin to 9.3 from baseline range of 11.2 - 11.9 over the previous month. Previous baseline before then was normal in the 14-15 range. Fecal occult blood was positive on 07/12/2017.  Today he states he's doing ok. He recently had a knee replacement (06/10/17). Postop he had a PE postoperatively and was placed on anticoagulation 07/17/17. He started seeing black stools about 2-3 weeks ago. His PCP has stopped his anticoagulation yesterday (last dose 07/13/17 in the evening). Had another black stool yesterday. Denies chest pain, dyspnea, weakness, dizziness, lightheadedness, syncope, near syncope. Denies hematochezia. Has lower abdominal pain. Has a bowel movement twice a day to every other day. Occasional straining. Colace has helped. Abdominal pain typically improves after bowel movement. Occasional sensation of incomplete emptying. Benefiber helps. Denies any other upper or lower GI symptoms.  Denies regular NSAID use. Last time was a couple days after root canal end of May 2018. Denies ASA powder use.  Past Medical History:  Diagnosis Date  . Arthritis   . Asbestosis ([place])   . Cyst of  kidney, acquired   . Headache   . History of fracture of leg    Right, childhood  . Hx of migraine headaches   . Hyperlipidemia   . Neuropathy   . PE (pulmonary thromboembolism) ([place])    After knee surgery  . Prostate cancer ([place]) 2015   Adenocarcinoma by biopsy    Past Surgical History:  Procedure Laterality Date  . BIOPSY PROSTATE  2003 and 2005  . COLONOSCOPY  11/28/2008   Dr. Rourk:internal hemorrhoids/tortus colon/ascending colon polyps, tubular adenomas  . COLONOSCOPY N/A 09/04/2014   Procedure: COLONOSCOPY;  Surgeon: Daneil Dolin, MD;  [facility];  Service: Endoscopy;  Laterality: N/A;  9:30  . TOTAL KNEE ARTHROPLASTY Right 06/10/2017  . TOTAL KNEE ARTHROPLASTY Right 06/10/2017   Procedure: TOTAL KNEE ARTHROPLASTY;  Surgeon: [name], MD;  Location: [facility];  Service: Orthopedics;  Laterality: Right;    Current Outpatient Prescriptions  Medication Sig Dispense Refill  . Cholecalciferol (VITAMIN D) 2000 units CAPS Take 2,000 Units by mouth 3 (three) times a week.     . docusate sodium (COLACE) 100 MG capsule Take 200 mg by mouth daily.    . Multiple Vitamin (MULTIVITAMIN WITH MINERALS) TABS tablet Take 1 tablet by mouth daily. Centrum Silver for Men    . rivaroxaban (XARELTO) 20 MG TABS tablet Take 1 tablet (20 mg total) by mouth daily with supper. (Patient not taking: Reported on 07/15/2017) 30 tablet 2   No current facility-administered medications for this visit.     Allergies as of 07/15/2017 - Review Complete 07/15/2017  Allergen Reaction Noted  . Sulfa antibiotics  Other (See Comments) 05/26/2017    Family History  Problem Relation Age of Onset  . Diabetes Mother   . Hypertension Mother   . Obesity Mother   . Heart disease Mother   . Mental illness Mother   . Hypertension Sister   . Obesity Sister   . Arthritis Sister   . Deep vein thrombosis Father   . Dementia Father   . Heart disease Father        CABG  . Colon cancer Neg Hx      Social History   Social History  . Marital status: Married    Spouse name: N/A  . Number of children: N/A  . Years of education: N/A   Occupational History  . Retired    Social History Main Topics  . Smoking status: Never Smoker  . Smokeless tobacco: Never Used  . Alcohol use No  . Drug use: No  . Sexual activity: Yes   Other Topics Concern  . None   Social History Narrative  . None    Review of Systems: General: Negative for anorexia, weight loss, fever, chills, fatigue, weakness. ENT: Negative for hoarseness, difficulty swallowing. CV: Negative for chest pain, angina, palpitations, peripheral edema.  Respiratory: Negative for dyspnea at rest, cough, sputum, wheezing.  GI: See history of present illness. Endo: Negative for unusual weight change.  Heme: Bleeding per HPI. Allergy: Negative for rash or hives.   Physical Exam: BP 119/68   Pulse 83   Temp 98 F (36.7 C) (Oral)   Ht 6\' 2"  (1.88 m)   Wt 170 lb 12.8 oz (77.5 kg)   BMI 21.93 kg/m  General:   Alert and oriented. Pleasant and cooperative. Well-nourished and well-developed.  Head:  Normocephalic and atraumatic. Eyes:  Without icterus, sclera clear and conjunctiva pink.  Ears:  Normal auditory acuity. Cardiovascular:  S1, S2 present without murmurs appreciated. Extremities without clubbing or edema. Respiratory:  Clear to auscultation bilaterally. No wheezes, rales, or rhonchi. No distress.  Gastrointestinal:  +BS, soft, non-tender and non-distended. No HSM noted. No guarding or rebound. No masses appreciated.  Rectal:  Deferred  Musculoskalatal:  Symmetrical without gross deformities. Neurologic:  Alert and oriented x4;  grossly normal neurologically. Psych:  Alert and cooperative. Normal mood and affect. Heme/Lymph/Immune: No excessive bruising noted.    07/15/2017 8:44 AM   Disclaimer: This note was dictated with voice recognition software. Similar sounding words can inadvertently be  transcribed and may not be corrected upon review.

## 2017-07-15 NOTE — Telephone Encounter (Signed)
07/15/17  has been sent to the hospital from [facility] dr.

## 2017-07-15 NOTE — Progress Notes (Addendum)
ANTICOAGULATION CONSULT NOTE - Initial Consult  Pharmacy Consult for HEPARIN Indication: h/o DVT (was recently on Xarelto)  Allergies  Allergen Reactions  . Sulfa Antibiotics Other (See Comments)    Unknown reaction. Childhood reaction   Patient Measurements: Height: 6\' 2"  (188 cm) Weight: 170 lb (77.1 kg) IBW/kg (Calculated) : 82.2 HEPARIN DW (KG): 77.1  Vital Signs: Temp: 98.3 F (36.8 C) (07/25 1300) Temp Source: Oral (07/25 1300) BP: 101/48 (07/25 1300) Pulse Rate: 71 (07/25 1300)  Labs:  Recent Labs  07/13/17 1259 07/15/17 1028  HGB 9.3* 9.2*  HCT 29.2* 27.8*  PLT 202 203  CREATININE  --  1.04   Estimated Creatinine Clearance: 76.2 mL/min (by C-G formula based on SCr of 1.04 mg/dL).  Medical History: Past Medical History:  Diagnosis Date  . Arthritis   . Asbestosis ([place])   . Cyst of kidney, acquired   . Headache   . History of fracture of leg    Right, childhood  . Hx of migraine headaches   . Hyperlipidemia   . Neuropathy   . PE (pulmonary thromboembolism) ([place])    After knee surgery  . Prostate cancer ([place]) 2015   Adenocarcinoma by biopsy   Medications:  Prescriptions Prior to Admission  Medication Sig Dispense Refill Last Dose  . acetaminophen (TYLENOL) 500 MG tablet Take 1,000 mg by mouth every 6 (six) hours as needed for mild pain or moderate pain.   Past Month at Unknown time  . Cholecalciferol (VITAMIN D) 2000 units CAPS Take 2,000 Units by mouth 3 (three) times a week.    07/14/2017 at Unknown time  . docusate sodium (COLACE) 100 MG capsule Take 200 mg by mouth daily.   07/14/2017 at Unknown time  . Multiple Vitamin (MULTIVITAMIN WITH MINERALS) TABS tablet Take 1 tablet by mouth daily. Centrum Silver for Men   07/14/2017 at Unknown time   Assessment: 66yo male on Xarelto as OP due to recent dx of DVT/PE.  Last dose of Xarelto reportedly 7/23.  Plan for EGD tomorrow per GI svc, D/W Roseanne Kaufman.  Asked to initiate Heparin in meantime with plans to  stop tomorrow ~4 hrs prior to EGD.  Will check aPTT and Heparin level since pt recently on Xarelto, will likely need to use aPTT to regulate heparin.  Pt admitted to hospital with melena so will dose on low end of range.    Goal of Therapy:  INR 2-3 Monitor platelets by anticoagulation protocol: Yes  APTT 66-103   Plan:  Check aPTT and HL now to determine if they correlate Begin Heparin infusion at 1200 units/hr Will consider low dose Heparin bolus if baseline labs are low Check HL and aPTT in 6-8 hrs then daily CBC daily while on Heparin  ADDENDUM:  Baseline labs OK, will order Heparin bolus 2000 units x 1, check labs later this pm  Hart Robinsons A 07/15/2017,1:16 PM   PM Addendum: PTT @ goal, HL near goal.  GI work up pending.  Keep Heparin infusion at 1200 units/hr F/U am labs  Pricilla Larsson, [facility] 07/15/2017 9:14 PM

## 2017-07-15 NOTE — Assessment & Plan Note (Signed)
Heme positive stool and black/tarry stools with a drop in hemoglobin from 11.52 weeks ago to 9.32 days ago. His Xarelto was stopped yesterday and his last dose was 07/13/2017 at 8 PM. He had another black bowel movement yesterday. There is concern for upper GI bleed in the setting of Xarelto status post DVT and PE after knee replacement surgery. He denies any symptomatic presentation of anemia: No chest pain, dyspnea, lightheadedness, dizziness, syncope, near syncope. His Hemoccult card 3 days ago was heme positive.  Discussed case with Dr. Oneida Alar who recommended presentation to the emergency room for workup and consideration of possible upper endoscopy. Return for follow-up in 3 months.

## 2017-07-15 NOTE — Assessment & Plan Note (Signed)
History of pulmonary embolism and DVT status post knee replacement surgery. He has been on Xarelto. He subsequently had a drop in hemoglobin associated with melena stools. Denies any symptomatic presentation of anemia. Primary care stopped his Xarelto yesterday and subsequently his last dose was 07/13/2017 at 8 PM. Denies any shortness of breath, lightheadedness, dizziness to indicate recurrence of acute pulmonary embolism or complications therein.

## 2017-07-15 NOTE — ED Notes (Signed)
ED Provider at bedside. 

## 2017-07-16 ENCOUNTER — Encounter (HOSPITAL_COMMUNITY): Payer: Self-pay

## 2017-07-16 ENCOUNTER — Encounter (HOSPITAL_COMMUNITY)
Admission: EM | Disposition: A | Discharge status: Home / Self Care | Payer: Self-pay | Source: Home / Self Care | Attending: Emergency Medicine

## 2017-07-16 DIAGNOSIS — I2601 Septic pulmonary embolism with acute cor pulmonale: Secondary | ICD-10-CM | POA: Diagnosis not present

## 2017-07-16 DIAGNOSIS — K2970 Gastritis, unspecified, without bleeding: Secondary | ICD-10-CM | POA: Diagnosis not present

## 2017-07-16 DIAGNOSIS — K921 Melena: Secondary | ICD-10-CM | POA: Diagnosis not present

## 2017-07-16 DIAGNOSIS — D62 Acute posthemorrhagic anemia: Secondary | ICD-10-CM | POA: Diagnosis not present

## 2017-07-16 HISTORY — PX: GIVENS CAPSULE STUDY: SHX5432

## 2017-07-16 HISTORY — PX: ESOPHAGOGASTRODUODENOSCOPY: SHX5428

## 2017-07-16 LAB — FOLATE: Folate: 15.3 ng/mL (ref >5.9)

## 2017-07-16 LAB — CBC
HCT: 23.9 % — ABNORMAL LOW (ref 39.0–52.0)
HEMOGLOBIN: 8 g/dL — AB (ref 13.0–17.0)
MCH: 30.7 pg (ref 26.0–34.0)
MCHC: 33.5 g/dL (ref 30.0–36.0)
MCV: 91.6 fL (ref 78.0–100.0)
Platelets: 179 10*3/uL (ref 150–400)
RBC: 2.61 MIL/uL — AB (ref 4.22–5.81)
RDW: 17.1 % — ABNORMAL HIGH (ref 11.5–15.5)
WBC: 4.9 10*3/uL (ref 4.0–10.5)

## 2017-07-16 LAB — BASIC METABOLIC PANEL
ANION GAP: 5 (ref 5–15)
BUN: 15 mg/dL (ref 6–20)
CALCIUM: 8.7 mg/dL — AB (ref 8.9–10.3)
CO2: 29 mmol/L (ref 22–32)
Chloride: 105 mmol/L (ref 101–111)
Creatinine, Ser: 1 mg/dL (ref 0.61–1.24)
Glucose, Bld: 105 mg/dL — ABNORMAL HIGH (ref 65–99)
Potassium: 3.9 mmol/L (ref 3.5–5.1)
SODIUM: 139 mmol/L (ref 135–145)

## 2017-07-16 LAB — HEPARIN LEVEL (UNFRACTIONATED): HEPARIN UNFRACTIONATED: 0.76 [IU]/mL — AB (ref 0.30–0.70)

## 2017-07-16 LAB — IRON AND TIBC
Iron: 40 ug/dL — ABNORMAL LOW (ref 45–182)
Saturation Ratios: 15 % — ABNORMAL LOW (ref 17.9–39.5)
TIBC: 262 ug/dL (ref 250–450)
UIBC: 222 ug/dL

## 2017-07-16 LAB — APTT: APTT: 144 s — AB (ref 24–36)

## 2017-07-16 LAB — FERRITIN: Ferritin: 145 ng/mL (ref 24–336)

## 2017-07-16 LAB — VITAMIN B12: VITAMIN B 12: 301 pg/mL (ref 180–914)

## 2017-07-16 SURGERY — EGD (ESOPHAGOGASTRODUODENOSCOPY)
Anesthesia: Moderate Sedation

## 2017-07-16 MED ORDER — SODIUM CHLORIDE 0.9 % IV SOLN
INTRAVENOUS | Status: AC | PRN
  Administered 2017-07-16: 1000 mL via INTRAVENOUS

## 2017-07-16 MED ORDER — PANTOPRAZOLE SODIUM 40 MG PO TBEC
40.0000 mg | DELAYED_RELEASE_TABLET | Freq: Every day | ORAL | Status: DC
  Administered 2017-07-17 – 2017-07-19 (×3): 40 mg via ORAL
  Filled 2017-07-16 (×4): qty 1

## 2017-07-16 MED ORDER — LIDOCAINE VISCOUS 2 % MT SOLN
OROMUCOSAL | Status: AC
  Filled 2017-07-16: qty 15

## 2017-07-16 MED ORDER — STERILE WATER FOR IRRIGATION IR SOLN
Status: DC | PRN
  Administered 2017-07-16: 16:00:00

## 2017-07-16 MED ORDER — HEPARIN (PORCINE) IN NACL 100-0.45 UNIT/ML-% IJ SOLN: 1000.0000 [IU]/h | INTRAMUSCULAR | Status: AC

## 2017-07-16 MED ORDER — MIDAZOLAM HCL 5 MG/5ML IJ SOLN
INTRAMUSCULAR | Status: AC
  Filled 2017-07-16: qty 10

## 2017-07-16 MED ORDER — MEPERIDINE HCL 100 MG/ML IJ SOLN
INTRAMUSCULAR | Status: DC | PRN
  Administered 2017-07-16: 25 mg via INTRAVENOUS
  Administered 2017-07-16: 50 mg via INTRAVENOUS

## 2017-07-16 MED ORDER — MIDAZOLAM HCL 5 MG/5ML IJ SOLN
INTRAMUSCULAR | Status: DC | PRN
  Administered 2017-07-16: 1 mg via INTRAVENOUS
  Administered 2017-07-16 (×2): 2 mg via INTRAVENOUS

## 2017-07-16 MED ORDER — LIDOCAINE VISCOUS 2 % MT SOLN
OROMUCOSAL | Status: DC | PRN
  Administered 2017-07-16: 5 mL via OROMUCOSAL

## 2017-07-16 MED ORDER — MEPERIDINE HCL 100 MG/ML IJ SOLN
INTRAMUSCULAR | Status: AC
  Filled 2017-07-16: qty 2

## 2017-07-16 NOTE — Op Note (Signed)
[facility] Patient Name: Luis Stewart Procedure Date: 07/16/2017 3:15 PM MRN: [id number] Date of Birth: [birth year] Attending MD: Barney Drain , MD CSN: [id number] Age: 66 Admit Type: Inpatient Procedure:                Upper GI endoscopy, DIAGNOSTIC Indications:              Melena Providers:                Barney Drain, MD, Hinton Rao, RN, Bonnetta Barry,                            Technician Referring MD:             Modena Nunnery. [place] Medicines:                Meperidine 75 mg IV, Midazolam 5 mg IV Complications:            No immediate complications. Estimated Blood Loss:     Estimated blood loss: none. Procedure:                Pre-Anesthesia Assessment:                           - Prior to the procedure, a History and Physical                            was performed, and patient medications and                            allergies were reviewed. The patient's tolerance of                            previous anesthesia was also reviewed. The risks                            and benefits of the procedure and the sedation                            options and risks were discussed with the patient.                            All questions were answered, and informed consent                            was obtained. Prior Anticoagulants: The patient has                            taken Eliquis (apixaban), last dose was day of                            procedure. ASA Grade Assessment: II - A patient                            with mild systemic disease. After reviewing the  risks and benefits, the patient was deemed in                            satisfactory condition to undergo the procedure.                            After obtaining informed consent, the endoscope was                            passed under direct vision. Throughout the                            procedure, the patient's blood pressure, pulse, and   oxygen saturations were monitored continuously. The                            [id number] ([id number]) scope was introduced through the                            mouth, and advanced to the second part of duodenum.                            The upper GI endoscopy was accomplished without                            difficulty. The patient tolerated the procedure                            well. Scope In: 3:40:14 PM Scope Out: 3:55:29 PM Total Procedure Duration: 0 hours 15 minutes 15 seconds  Findings:      The examined esophagus was normal.      Patchy mild inflammation characterized by erythema was found in the       gastric antrum.      The examined duodenum was normal. GIVENS CAPSULE RELEASED IN SMALL BOWEL. Impression:               - NO SOURCE FOR MELENA IDENTIFIED.                           - MILD Gastritis. Moderate Sedation:      Moderate (conscious) sedation was administered by the endoscopy nurse       and supervised by the endoscopist. The following parameters were       monitored: oxygen saturation, heart rate, blood pressure, and response       to care. Total physician intraservice time was 22 minutes. Recommendation:           - NPO for 2 hours.                           - Clear liquid diet for 2 hours.                           - Full liquid diet.                           -  Continue present medications.                           -WILL READ GIVENS @0730  JUL 27. IF NO SOURCE FOR                            MELENA SEENIN SMALL BOWEL, PT WILL NEED TCS.                           - Return patient to hospital ward for ongoing care. Procedure Code(s):        --- Professional ---                           [phone], Esophagogastroduodenoscopy, flexible,                            transoral; diagnostic, including collection of                            specimen(s) by brushing or washing, when performed                            (separate procedure)                           99152,  Moderate sedation services provided by the                            same physician or other qualified health care                            professional performing the diagnostic or                            therapeutic service that the sedation supports,                            requiring the presence of an independent trained                            observer to assist in the monitoring of the                            patient's level of consciousness and physiological                            status; initial 15 minutes of intraservice time,                            patient age 69 years or older Diagnosis Code(s):        --- Professional ---                           K29.70, Gastritis, unspecified, without bleeding  K92.1, Melena (includes Hematochezia) CPT copyright 2016 American Medical Association. All rights reserved. The codes documented in this report are preliminary and upon coder review may  be revised to meet current compliance requirements. Barney Drain, MD Barney Drain, MD 07/16/2017 4:06:58 PM This report has been signed electronically. Number of Addenda: 0

## 2017-07-16 NOTE — Progress Notes (Signed)
Givens capsule endoscopy placed via scope at 1555 per Dr Oneida Alar.   OK to remove recorder in am

## 2017-07-16 NOTE — Care Management CC44 (Signed)
Condition Code 44 Documentation Completed  Patient Details  Name: Luis Stewart MRN: [id number] Date of Birth: 1951-02-06   Condition Code 44 given:    Patient signature on Condition Code 44 notice:    Documentation of 2 MD's agreement:   yes Code 44 added to claim:   yes    Beau Fanny, RN 07/16/2017, 1:14 PM

## 2017-07-16 NOTE — Care Management CC44 (Addendum)
Condition Code 44 Documentation Completed  Patient Details  Name: Luis Stewart MRN: [id number] Date of Birth: [birth year]   Condition Code 44 given:    yes Patient signature on Condition Code 44 notice:yes Documentation of 2 MD's agreement:   yes Code 44 added to claim:   yes    Itati Brocksmith, Chauncey Reading, RN 07/16/2017, 11:18 AM

## 2017-07-16 NOTE — Care Management Note (Signed)
Case Management Note  Patient Details  Name: Luis Stewart MRN: [id number] Date of Birth: July 02, 1951  Subjective/Objective:  Adm with anemia. EGD later this afternoon. From home, ind PTA. Recent TKR, has cane and RW, doesn't use currently. Current with OP PT.   Action/Plan: Plans to return home with self care.    Expected Discharge Date:       07/17/2017           Expected Discharge Plan:  Home/Self Care  In-House Referral:     Discharge planning Services  CM Consult  Post Acute Care Choice:  NA Choice offered to:  NA  DME Arranged:    DME Agency:     HH Arranged:    HH Agency:     Status of Service:  Completed, signed off  If discussed at [employer] of Stay Meetings, dates discussed:    Additional Comments:  Barnard Sharps, Chauncey Reading, RN 07/16/2017, 11:21 AM

## 2017-07-16 NOTE — H&P (Signed)
Primary Care Physician:  Alycia Rossetti, MD Primary Gastroenterologist:  Dr. Oneida Alar  Pre-Procedure History & Physical: HPI:  Luis Stewart is a 66 y.o. male here for MELENA.  Past Medical History:  Diagnosis Date  . Arthritis   . Asbestosis ([place])   . Cyst of kidney, acquired   . Headache   . History of fracture of leg    Right, childhood  . Hx of migraine headaches   . Hyperlipidemia   . Neuropathy   . PE (pulmonary thromboembolism) ([place])    After knee surgery  . Prostate cancer ([place]) 2015   Adenocarcinoma by biopsy    Past Surgical History:  Procedure Laterality Date  . BIOPSY PROSTATE  2003 and 2005  . COLONOSCOPY  11/28/2008   Dr. Rourk:internal hemorrhoids/tortus colon/ascending colon polyps, tubular adenomas  . COLONOSCOPY N/A 09/04/2014   Procedure: COLONOSCOPY;  Surgeon: Daneil Dolin, MD;  [facility];  Service: Endoscopy;  Laterality: N/A;  9:30  . TOTAL KNEE ARTHROPLASTY Right 06/10/2017  . TOTAL KNEE ARTHROPLASTY Right 06/10/2017   Procedure: TOTAL KNEE ARTHROPLASTY;  Surgeon: [name], MD;  Location: [place];  Service: Orthopedics;  Laterality: Right;    Prior to Admission medications   Medication Sig Start Date End Date Taking? Authorizing Provider  acetaminophen (TYLENOL) 500 MG tablet Take 1,000 mg by mouth every 6 (six) hours as needed for mild pain or moderate pain.   Yes [provider]  Cholecalciferol (VITAMIN D) 2000 units CAPS Take 2,000 Units by mouth 3 (three) times a week.    Yes [provider]  docusate sodium (COLACE) 100 MG capsule Take 200 mg by mouth daily.   Yes [provider]  Multiple Vitamin (MULTIVITAMIN WITH MINERALS) TABS tablet Take 1 tablet by mouth daily. Centrum Silver for Men   Yes [provider]    Allergies as of 07/15/2017 - Review Complete 07/15/2017  Allergen Reaction Noted  . Sulfa antibiotics Other (See Comments) 05/26/2017    Family History  Problem  Relation Age of Onset  . Diabetes Mother   . Hypertension Mother   . Obesity Mother   . Heart disease Mother   . Mental illness Mother   . Hypertension Sister   . Obesity Sister   . Arthritis Sister   . Deep vein thrombosis Father   . Dementia Father   . Heart disease Father        CABG  . Colon cancer Neg Hx     Social History   Social History  . Marital status: Married    Spouse name: N/A  . Number of children: N/A  . Years of education: N/A   Occupational History  . Retired    Social History Main Topics  . Smoking status: Never Smoker  . Smokeless tobacco: Never Used  . Alcohol use No  . Drug use: No  . Sexual activity: Yes   Other Topics Concern  . Not on file   Social History Narrative  . No narrative on file    Review of Systems: See HPI, otherwise negative ROS   Physical Exam: BP 120/72   Pulse 70   Temp 98.6 F (37 C) (Oral)   Resp 13   Ht 6\' 2"  (1.88 m)   Wt 170 lb (77.1 kg)   SpO2 100%   BMI 21.83 kg/m  General:   Alert,  pleasant and cooperative in NAD Head:  Normocephalic and atraumatic. Neck:  Supple; Lungs:  Clear  throughout to auscultation.    Heart:  Regular rate and rhythm. Abdomen:  Soft, nontender and nondistended. Normal bowel sounds, without guarding, and without rebound.   Neurologic:  Alert and  oriented x4;  grossly normal neurologically.  Impression/Plan:   MELENA  PLAN: 1. EGD TODAY. DISCUSSED PROCEDURE, BENEFITS, & RISKS: < 1% chance of medication reaction, bleeding, perforation, or rupture of spleen/liver.

## 2017-07-16 NOTE — Progress Notes (Signed)
PROGRESS NOTE    Luis Stewart  VQQ:[id number] DOB: January 27, 1951 DOA: 07/15/2017 PCP: Alycia Rossetti, MD     Brief Narrative:  66 year old man admitted to the hospital on 7/25 due to melena and acute blood loss anemia. It is important to note that he was recently started on Xarelto for pulmonary embolism. Plan is for EGD today and recommendations for safety of anticoagulation to follow.   Assessment & Plan:   Principal Problem:   Acute blood loss anemia Active Problems:   Pulmonary embolism (HCC)   Melena   Melena/acute blood loss anemia -Patient has had 2 episodes of melena overnight, hemoglobin has further decreased from 9.2-8 today. -Still without indication for acute transfusion, although I suspect he will likely need blood prior to discharge. -Continue twice a day PPI. -Await results of EGD.  Recent diagnosis of pulmonary embolism -Xarelto is on hold, GI discussed case with hematology who recommended placing on a heparin drip, this was discontinued this morning for procedure. -As his hemoglobin continues to drop, I would strongly recommend holding anticoagulation for now.   DVT prophylaxis: IV heparin Code Status: full code Family Communication: friend at bedside updated on plan of care and all questions answered Disposition Plan: Anticipate DC home in 24-48 hours  Consultants:   GI  Procedures:   EGD today  Antimicrobials:  Anti-infectives    None       Subjective: Feels well, no complaints, had 2 melanotic stools overnight  Objective: Vitals:   07/16/17 1424 07/16/17 1530 07/16/17 1535 07/16/17 1540  BP: 120/72 124/75 (!) 103/51   Pulse: 70 77 83 73  Resp: 13 (!) 27 14 (!) 8  Temp: 98.6 F (37 C)     TempSrc: Oral     SpO2: 100% 100% 100% 100%  Weight:      Height:        Intake/Output Summary (Last 24 hours) at 07/16/17 1601 Last data filed at 07/16/17 1200  Gross per 24 hour  Intake           1668.5 ml  Output              300  ml  Net           1368.5 ml   Filed Weights   07/15/17 0923 07/15/17 1300  Weight: 77.5 kg (170 lb 12 oz) 77.1 kg (170 lb)    Examination:  General exam: Alert, awake, oriented x 3 Respiratory system: Clear to auscultation. Respiratory effort normal. Cardiovascular system:RRR. No murmurs, rubs, gallops. Gastrointestinal system: Abdomen is nondistended, soft and nontender. No organomegaly or masses felt. Normal bowel sounds heard. Central nervous system: Alert and oriented. No focal neurological deficits. Extremities: No C/C/E, +pedal pulses Skin: No rashes, lesions or ulcers Psychiatry: Judgement and insight appear normal. Mood & affect appropriate.     Data Reviewed: I have personally reviewed following labs and imaging studies  CBC:  Recent Labs Lab 07/13/17 1259 07/15/17 1028 07/15/17 2023 07/16/17 0608  WBC 6.3 6.0 4.7 4.9  NEUTROABS 4,473 4.0  --   --   HGB 9.3* 9.2* 8.1* 8.0*  HCT 29.2* 27.8* 24.1* 23.9*  MCV 94.5 92.4 92.7 91.6  PLT 202 203 187 433   Basic Metabolic Panel:  Recent Labs Lab 07/15/17 1028 07/16/17 0608  NA 140 139  K 3.9 3.9  CL 104 105  CO2 28 29  GLUCOSE 108* 105*  BUN 17 15  CREATININE 1.04 1.00  CALCIUM 9.0 8.7*   GFR:  Estimated Creatinine Clearance: 79.2 mL/min (by C-G formula based on SCr of 1 mg/dL). Liver Function Tests:  Recent Labs Lab 07/15/17 1028  AST 17  ALT 10*  ALKPHOS 74  BILITOT 0.6  PROT 6.7  ALBUMIN 3.4*   No results for input(s): LIPASE, AMYLASE in the last 168 hours. No results for input(s): AMMONIA in the last 168 hours. Coagulation Profile: No results for input(s): INR, PROTIME in the last 168 hours. Cardiac Enzymes: No results for input(s): CKTOTAL, CKMB, CKMBINDEX, TROPONINI in the last 168 hours. BNP (last 3 results) No results for input(s): PROBNP in the last 8760 hours. HbA1C: No results for input(s): HGBA1C in the last 72 hours. CBG: No results for input(s): GLUCAP in the last 168  hours. Lipid Profile: No results for input(s): CHOL, HDL, LDLCALC, TRIG, CHOLHDL, LDLDIRECT in the last 72 hours. Thyroid Function Tests: No results for input(s): TSH, T4TOTAL, FREET4, T3FREE, THYROIDAB in the last 72 hours. Anemia Panel:  Recent Labs  07/15/17 1334 07/15/17 2023  VITAMINB12 301  --   FOLATE  --  15.3  FERRITIN 145  --   TIBC 262  --   IRON 40*  --   RETICCTPCT  --  4.7*   Urine analysis:    Component Value Date/Time   COLORURINE YELLOW 06/17/2017 2352   APPEARANCEUR CLEAR 06/17/2017 2352   LABSPEC 1.013 06/17/2017 2352   PHURINE 5.0 06/17/2017 2352   GLUCOSEU NEGATIVE 06/17/2017 2352   HGBUR NEGATIVE 06/17/2017 2352   BILIRUBINUR NEGATIVE 06/17/2017 2352   KETONESUR NEGATIVE 06/17/2017 2352   PROTEINUR NEGATIVE 06/17/2017 2352   NITRITE NEGATIVE 06/17/2017 2352   LEUKOCYTESUR NEGATIVE 06/17/2017 2352   Sepsis Labs: @LABRCNTIP (procalcitonin:4,lacticidven:4)  )No results found for this or any previous visit (from the past 240 hour(s)).       Radiology Studies: No results found.      Scheduled Meds: . lidocaine      . meperidine      . midazolam      . [MAR Hold] pantoprazole (PROTONIX) IV  40 mg Intravenous Q12H   Continuous Infusions: . sodium chloride Stopped (07/16/17 0951)  . sodium chloride 75 mL/hr at 07/15/17 1800     LOS: 1 day    Time spen25 minutes Greater than 50% of this time was spent in direct contact with the patient coordinating care.     Lelon Frohlich, MD [facility] Pager [phone]  If 7PM-7AM, please contact night-coverage [email or web address] Password [email or web address] 07/16/2017, 4:01 PM

## 2017-07-16 NOTE — Progress Notes (Signed)
ANTICOAGULATION CONSULT NOTE - follow up  Pharmacy Consult for HEPARIN Indication: h/o DVT (was recently on Xarelto)  Allergies  Allergen Reactions  . Sulfa Antibiotics Other (See Comments)    Unknown reaction. Childhood reaction   Patient Measurements: Height: 6\' 2"  (188 cm) Weight: 170 lb (77.1 kg) IBW/kg (Calculated) : 82.2 HEPARIN DW (KG): 77.1  Vital Signs: Temp: 98.3 F (36.8 C) (07/26 0634) Temp Source: Oral (07/26 0634) BP: 120/65 (07/26 0634) Pulse Rate: 80 (07/26 0634)  Labs:  Recent Labs  07/15/17 1028 07/15/17 1334 07/15/17 2023 07/16/17 0605 07/16/17 0608  HGB 9.2*  --  8.1*  --  8.0*  HCT 27.8*  --  24.1*  --  23.9*  PLT 203  --  187  --  179  APTT  --  34 70*  --   --   HEPARINUNFRC  --  <0.10* 0.28* 0.76*  --   CREATININE 1.04  --   --   --  1.00   Estimated Creatinine Clearance: 79.2 mL/min (by C-G formula based on SCr of 1 mg/dL).  Medical History: Past Medical History:  Diagnosis Date  . Arthritis   . Asbestosis ([place])   . Cyst of kidney, acquired   . Headache   . History of fracture of leg    Right, childhood  . Hx of migraine headaches   . Hyperlipidemia   . Neuropathy   . PE (pulmonary thromboembolism) ([place])    After knee surgery  . Prostate cancer ([place]) 2015   Adenocarcinoma by biopsy   Medications:  Prescriptions Prior to Admission  Medication Sig Dispense Refill Last Dose  . acetaminophen (TYLENOL) 500 MG tablet Take 1,000 mg by mouth every 6 (six) hours as needed for mild pain or moderate pain.   Past Month at Unknown time  . Cholecalciferol (VITAMIN D) 2000 units CAPS Take 2,000 Units by mouth 3 (three) times a week.    07/14/2017 at Unknown time  . docusate sodium (COLACE) 100 MG capsule Take 200 mg by mouth daily.   07/14/2017 at Unknown time  . Multiple Vitamin (MULTIVITAMIN WITH MINERALS) TABS tablet Take 1 tablet by mouth daily. Centrum Silver for Men   07/14/2017 at Unknown time   Assessment: 66yo male on Xarelto as OP  due to recent dx of DVT/PE.  Last dose of Xarelto reportedly 7/23.  Plan for EGD tomorrow per GI svc, D/W Roseanne Kaufman.  Asked to initiate Heparin in meantime with plans to stop tomorrow ~4 hrs prior to EGD.  Will check aPTT and Heparin level since pt recently on Xarelto, will likely need to use aPTT to regulate heparin.  Pt admitted to hospital with melena so will dose on low end of range.  Heparin level has trended up above target range.  H/H appears relatively stable.  PLTs OK  Goal of Therapy:  INR 2-3 Monitor platelets by anticoagulation protocol: Yes  APTT 66-103   Plan:   Decrease Heparin to 1000 units/hr now  Plan is to stop Heparin at 11am for EGD later today  F/U plans post procedure  Ena Dawley, [facility] 07/16/2017 7:34 AM

## 2017-07-17 ENCOUNTER — Encounter (HOSPITAL_COMMUNITY): Payer: Self-pay | Admitting: Gastroenterology

## 2017-07-17 ENCOUNTER — Telehealth (HOSPITAL_COMMUNITY): Payer: Self-pay | Admitting: Family Medicine

## 2017-07-17 ENCOUNTER — Encounter (HOSPITAL_COMMUNITY)
Admission: EM | Disposition: A | Discharge status: Home / Self Care | Payer: Self-pay | Source: Home / Self Care | Attending: Emergency Medicine

## 2017-07-17 ENCOUNTER — Ambulatory Visit (HOSPITAL_COMMUNITY): Payer: Medicare Other

## 2017-07-17 ENCOUNTER — Observation Stay (HOSPITAL_COMMUNITY): Payer: Medicare Other

## 2017-07-17 DIAGNOSIS — K529 Noninfective gastroenteritis and colitis, unspecified: Secondary | ICD-10-CM | POA: Diagnosis not present

## 2017-07-17 DIAGNOSIS — K648 Other hemorrhoids: Secondary | ICD-10-CM

## 2017-07-17 DIAGNOSIS — K921 Melena: Secondary | ICD-10-CM | POA: Diagnosis not present

## 2017-07-17 DIAGNOSIS — D62 Acute posthemorrhagic anemia: Secondary | ICD-10-CM | POA: Diagnosis not present

## 2017-07-17 DIAGNOSIS — D122 Benign neoplasm of ascending colon: Secondary | ICD-10-CM

## 2017-07-17 DIAGNOSIS — I824Z3 Acute embolism and thrombosis of unspecified deep veins of distal lower extremity, bilateral: Secondary | ICD-10-CM | POA: Diagnosis not present

## 2017-07-17 HISTORY — PX: POLYPECTOMY: SHX5525

## 2017-07-17 HISTORY — PX: COLONOSCOPY: SHX5424

## 2017-07-17 LAB — HEPARIN LEVEL (UNFRACTIONATED): Heparin Unfractionated: 0.26 IU/mL — ABNORMAL LOW (ref 0.30–0.70)

## 2017-07-17 LAB — ABO/RH: ABO/RH(D): A POS

## 2017-07-17 LAB — CBC
HEMATOCRIT: 22.1 % — AB (ref 39.0–52.0)
HEMATOCRIT: 26.8 % — AB (ref 39.0–52.0)
HEMOGLOBIN: 7.3 g/dL — AB (ref 13.0–17.0)
HEMOGLOBIN: 8.9 g/dL — AB (ref 13.0–17.0)
MCH: 30.5 pg (ref 26.0–34.0)
MCH: 30.8 pg (ref 26.0–34.0)
MCHC: 33 g/dL (ref 30.0–36.0)
MCHC: 33.2 g/dL (ref 30.0–36.0)
MCV: 92.5 fL (ref 78.0–100.0)
MCV: 92.7 fL (ref 78.0–100.0)
Platelets: 165 10*3/uL (ref 150–400)
Platelets: 215 10*3/uL (ref 150–400)
RBC: 2.39 MIL/uL — ABNORMAL LOW (ref 4.22–5.81)
RBC: 2.89 MIL/uL — AB (ref 4.22–5.81)
RDW: 17 % — AB (ref 11.5–15.5)
RDW: 17 % — ABNORMAL HIGH (ref 11.5–15.5)
WBC: 4.1 10*3/uL (ref 4.0–10.5)
WBC: 4 10*3/uL (ref 4.0–10.5)

## 2017-07-17 LAB — APTT: APTT: 94 s — AB (ref 24–36)

## 2017-07-17 LAB — PREPARE RBC (CROSSMATCH)

## 2017-07-17 SURGERY — COLONOSCOPY
Anesthesia: Moderate Sedation

## 2017-07-17 MED ORDER — SODIUM CHLORIDE 0.9% FLUSH
INTRAVENOUS | Status: AC
  Filled 2017-07-17: qty 10

## 2017-07-17 MED ORDER — SODIUM CHLORIDE 0.9 % IV SOLN: INTRAVENOUS | Status: DC

## 2017-07-17 MED ORDER — CYANOCOBALAMIN 1000 MCG/ML IJ SOLN
1000.0000 ug | Freq: Once | INTRAMUSCULAR | Status: AC
  Administered 2017-07-17: 1000 ug via SUBCUTANEOUS
  Filled 2017-07-17: qty 1

## 2017-07-17 MED ORDER — MIDAZOLAM HCL 5 MG/5ML IJ SOLN
INTRAMUSCULAR | Status: DC | PRN
  Administered 2017-07-17: 2 mg via INTRAVENOUS
  Administered 2017-07-17: 1 mg via INTRAVENOUS
  Administered 2017-07-17 (×2): 2 mg via INTRAVENOUS

## 2017-07-17 MED ORDER — MEPERIDINE HCL 100 MG/ML IJ SOLN
INTRAMUSCULAR | Status: AC
  Filled 2017-07-17: qty 2

## 2017-07-17 MED ORDER — FENTANYL CITRATE (PF) 100 MCG/2ML IJ SOLN
INTRAMUSCULAR | Status: AC
  Filled 2017-07-17: qty 2

## 2017-07-17 MED ORDER — HEPARIN (PORCINE) IN NACL 100-0.45 UNIT/ML-% IJ SOLN
1000.0000 [IU]/h | INTRAMUSCULAR | Status: DC
  Administered 2017-07-17: 1000 [IU]/h via INTRAVENOUS
  Filled 2017-07-17: qty 250

## 2017-07-17 MED ORDER — SODIUM CHLORIDE 0.9 % IV SOLN: Freq: Once | INTRAVENOUS | Status: DC

## 2017-07-17 MED ORDER — FENTANYL CITRATE (PF) 100 MCG/2ML IJ SOLN
INTRAMUSCULAR | Status: DC | PRN
  Administered 2017-07-17 (×4): 25 ug via INTRAVENOUS

## 2017-07-17 MED ORDER — MIDAZOLAM HCL 5 MG/5ML IJ SOLN
INTRAMUSCULAR | Status: AC
  Filled 2017-07-17: qty 10

## 2017-07-17 MED ORDER — PEG 3350-KCL-NA BICARB-NACL 420 G PO SOLR
4000.0000 mL | Freq: Once | ORAL | Status: AC
Start: 2017-07-17 — End: 2017-07-17
  Administered 2017-07-17: 4000 mL via ORAL
  Filled 2017-07-17: qty 4000

## 2017-07-17 NOTE — Progress Notes (Signed)
REVIEWED-NO ADDITIONAL RECOMMENDATIONS(SLF).  Saw patient to discuss plan. Capsule did not show obvious source of bleeding. Plans for colonoscopy today. Has eaten a couple bites of pudding and some grits and juice this morning. Per Dr. Oneida Alar this is ok. Patient added on to schedule, bowel prep ordered, informed nurse of need to get prep going ASAP.  Hold Heparin now for planned procedure 1400 today.

## 2017-07-17 NOTE — Op Note (Signed)
REVIEWED. Scope trauma in small bowel. NO LD OR FRESH BLOOD IN SMALL BOWEL. OLD BLOOD AND NL STOOL IN COLON. NO FRESH BLOOD IN COLON.  Small Bowel Givens Capsule Study Procedure date:  07/16/17  Referring Provider:  [facility] PCP:  Dr. Buelah Manis, Modena Nunnery, MD  Indication for procedure:  Anemia, melena  Patient data:  Wt: 170 lb (77.5 kg) Ht: 6'2" (1.88 m) Waist: N/A  Findings:  A few small areas of blood flecks likely from the couple noted areas of scope trauma. No overt gastric or small bowel gross blood, masses, polyps. The capsule seemed stuck at the cecal entrance and just inside the cecum possible regurgitating blood with an area noted of possible erosion versus blood fleck. Overall, no definite findings to explain melena.  First Gastric image:  00:03:10 First Duodenal image: 00:47:10 First Ileo-Cecal Valve image: N/A First Cecal image: 06:47:27 Gastric Passage time: 0h 62m Small Bowel Passage time:  6h 66m  Summary & Recommendations: EGD without significant findings. Capsule without significant findings. Plan for colonoscopy today to wrap up GI evaluation for anemia/GI bleed. Further recommendations regarding anticoagulation based on colonoscopy and final total GI evaluation.  Thank you for allowing [place] to participate in the care of [place], DNP, AGNP-C Adult & Gerontological Nurse Practitioner [facility]

## 2017-07-17 NOTE — Progress Notes (Signed)
[facility] Progress Note  Patient: Luis Stewart [id number]:[id number]   PCP: Alycia Rossetti, MD DOB: 02-Apr-1951   DOA: 07/15/2017   DOS: 07/17/2017   Date of Service: the patient was seen and examined on 07/17/2017  Subjective: feeling better, no bleding, no BM no melena. No nausea. No back pain, no shortnes of breath, dizziness  Brief hospital course: Pt. with PMH of PE 06/18/2017 after knee surgery,Prostate cancer, dyslipidemia ; admitted on 07/15/2017, presented with complaint of melena, was found to have symptomatic anemia due to GI bleed. Currently further plan is continue further workup.  Assessment and Plan: 1. Symptomatic anemia. Melena. Patient presented with melena as well as hemoglobin 9.2. Baseline is 11. Underwent EGD which was unremarkable, capsule endoscopy which is also unremarkable. Currently Is to Undergo Colonoscopy for Further Workup. No External Bleeding Reported, Hemoglobin Dropped to 7.3. Likely Dilutional but Cannot Rule Out Active Bleeding. Transfuse 1 PRBC.  2. Acute DVT PE. Provoked after knee surgery Diagnosed 1 month ago started on Xarelto. Since this is an acute situation patient is still on IV heparin which I would continue monitor until the actually see an active bleeding. She is on PRBC. Monitor. Recheck ultrasound Doppler.  3. Relative vitamin B-12 deficiency. Vitamin B-12 level 301. With acute anemia will give 1 dose of subcutaneous vitamin B-12.   4. Abdominal pain.  patient does have mild abdominal pain, if the colonoscopy is also negative will get CT abdomen with contrast.  Diet: Nothing by mouth DVT Prophylaxis: mechanical compression device  Advance goals of care discussion: full code  Family Communication: family was present at bedside, at the time of interview. The pt provided permission to discuss medical plan with the family. Opportunity was given to ask question and all questions were answered satisfactorily.   Disposition:    Discharge to home.  Consultants: gastroenterology Procedures: PRBC, capsule endoscopy, EGD  Antibiotics: Anti-infectives    None       Objective: Physical Exam: Vitals:   07/16/17 2046 07/16/17 2100 07/17/17 0500 07/17/17 0700  BP:  (!) 98/57 117/60   Pulse:  61 74   Resp:  20 20   Temp:  98.3 F (36.8 C) 98.4 F (36.9 C)   TempSrc:  Oral Oral   SpO2: 93% 100% 100% 98%  Weight:      Height:        Intake/Output Summary (Last 24 hours) at 07/17/17 1244 Last data filed at 07/17/17 0417  Gross per 24 hour  Intake          1726.58 ml  Output                0 ml  Net          1726.58 ml   Filed Weights   07/15/17 0923 07/15/17 1300  Weight: 77.5 kg (170 lb 12 oz) 77.1 kg (170 lb)   General: Alert, Awake and Oriented to Time, Place and Person. Appear in mild distress, affect appropriate Eyes: PERRL, Conjunctiva normal ENT: Oral Mucosa clear moist. Neck: no JVD, no Abnormal Mass Or lumps Cardiovascular: S1 and S2 Present, no Murmur, Peripheral Pulses Present Respiratory: normal respiratory effort, Bilateral Air entry equal and Decreased, no use of accessory muscle, noClear to Auscultation, no Crackles, no wheezes Abdomen: Bowel Sound present, Soft and no tenderness, no hernia Skin: no redness, no Rash, no induration Extremities: no Pedal edema, no calf tenderness Neurologic: Grossly no focal neuro deficit. Bilaterally Equal motor strength  Data Reviewed: CBC:  Recent Labs Lab 07/13/17 1259 07/15/17 1028 07/15/17 2023 07/16/17 0608 07/17/17 0559  WBC 6.3 6.0 4.7 4.9 4.1  NEUTROABS 4,473 4.0  --   --   --   HGB 9.3* 9.2* 8.1* 8.0* 7.3*  HCT 29.2* 27.8* 24.1* 23.9* 22.1*  MCV 94.5 92.4 92.7 91.6 92.5  PLT 202 203 187 179 130   Basic Metabolic Panel:  Recent Labs Lab 07/15/17 1028 07/16/17 0608  NA 140 139  K 3.9 3.9  CL 104 105  CO2 28 29  GLUCOSE 108* 105*  BUN 17 15  CREATININE 1.04 1.00  CALCIUM 9.0 8.7*    Liver Function Tests:  Recent  Labs Lab 07/15/17 1028  AST 17  ALT 10*  ALKPHOS 74  BILITOT 0.6  PROT 6.7  ALBUMIN 3.4*   No results for input(s): LIPASE, AMYLASE in the last 168 hours. No results for input(s): AMMONIA in the last 168 hours. Coagulation Profile: No results for input(s): INR, PROTIME in the last 168 hours. Cardiac Enzymes: No results for input(s): CKTOTAL, CKMB, CKMBINDEX, TROPONINI in the last 168 hours. BNP (last 3 results) No results for input(s): PROBNP in the last 8760 hours. CBG: No results for input(s): GLUCAP in the last 168 hours. Studies: No results found.  Scheduled Meds: . pantoprazole  40 mg Oral QAC breakfast   Continuous Infusions: . sodium chloride 75 mL/hr at 07/16/17 2341  . sodium chloride    . heparin 1,000 Units/hr (07/16/17 1645)   PRN Meds: ondansetron **OR** ondansetron (ZOFRAN) IV, senna-docusate  Time spent: 35 minutes  Author: Berle Mull, MD [facility] Pager: [phone] 07/17/2017 12:44 PM  If 7PM-7AM, please contact night-coverage at [email or web address], password [facility]

## 2017-07-17 NOTE — Op Note (Signed)
[facility] Patient Name: Luis Stewart Procedure Date: 07/17/2017 2:50 PM MRN: [id number] Date of Birth: May 11, 1951 Attending MD: Barney Drain , MD CSN: [id number] Age: 66 Admit Type: Inpatient Procedure:                Colonoscopy, DIAGNOSTIC Indications:              Melena, Acute post hemorrhagic anemia on XARELTO                            FOR PE. EGD/GIVENS JUL 26 DID NOT REVEAL AN                            ETIOLOGY FOR BLEEDING. Providers:                Barney Drain, MD, Lurline Del, RN, Aram Candela Referring MD:             Modena Nunnery.  Medicines:                Fentanyl 100 micrograms IV, Midazolam 7 mg IV Complications:            No immediate complications. Estimated Blood Loss:     Estimated blood loss: none. Procedure:                Pre-Anesthesia Assessment:                           - Prior to the procedure, a History and Physical                            was performed, and patient medications and                            allergies were reviewed. The patient's tolerance of                            previous anesthesia was also reviewed. The risks                            and benefits of the procedure and the sedation                            options and risks were discussed with the patient.                            All questions were answered, and informed consent                            was obtained. Prior Anticoagulants: The patient has                            taken heparin. ASA Grade Assessment: II - A patient                            with mild systemic disease. After reviewing the  risks and benefits, the patient was deemed in                            satisfactory condition to undergo the procedure.                            After obtaining informed consent, the colonoscope                            was passed under direct vision. Throughout the                            procedure, the patient's  blood pressure, pulse, and                            oxygen saturations were monitored continuously. The                            EC-[id number] ([id number]) scope was introduced through                            the anus and advanced to the the cecum, identified                            by appendiceal orifice and ileocecal valve. The                            colonoscopy was somewhat difficult due to a                            tortuous colon. Successful completion of the                            procedure was aided by COLOWRAP. The patient                            tolerated the procedure well. The quality of the                            bowel preparation was excellent. The ileocecal                            valve, appendiceal orifice, and rectum were                            photographed. Scope In: 3:38:53 PM Scope Out: 3:58:52 PM Scope Withdrawal Time: 0 hours 16 minutes 51 seconds  Total Procedure Duration: 0 hours 19 minutes 59 seconds  Findings:      The recto-sigmoid colon and sigmoid colon were moderately redundant.      External and internal hemorrhoids were found during retroflexion. The       hemorrhoids were moderate.      A 2 mm polyp was found in the cecum. The polyp was sessile. The polyp  was removed with a cold biopsy forceps. Resection and retrieval were       complete. Impression:               - Redundant colon.                           - External and internal hemorrhoids.                           - NO OLD BLOOD OR FRESH BLOOD IN COLON                           - NO HIGH RISK LESION IDENTIFIED IN THE UPPER OR                            LOWER GI TRACT Moderate Sedation:      Moderate (conscious) sedation was administered by the endoscopy nurse       and supervised by the endoscopist. The following parameters were       monitored: oxygen saturation, heart rate, blood pressure, and response       to care. Total physician intraservice time was 25  minutes. Recommendation:           - Await pathology results.                           - Repeat colonoscopy in 5-10 years for surveillance.                           - Continue present medications. OK TO RE-START                            HEPARIN & TRANSITION TO AN OUTPATIENT REGIMEN.                            WOULD AVOID XARELTO.                           - High fiber diet and cardiac diet.                           - Return patient to hospital ward for ongoing care. Procedure Code(s):        --- Professional ---                           [phone], Colonoscopy, flexible; with biopsy, single                            or multiple                           99152, Moderate sedation services provided by the                            same physician or other qualified health care  professional performing the diagnostic or                            therapeutic service that the sedation supports,                            requiring the presence of an independent trained                            observer to assist in the monitoring of the                            patient's level of consciousness and physiological                            status; initial 15 minutes of intraservice time,                            patient age 71 years or older                           [phone], Moderate sedation services; each additional                            15 minutes intraservice time Diagnosis Code(s):        --- Professional ---                           K64.8, Other hemorrhoids                           K92.1, Melena (includes Hematochezia)                           D62, Acute posthemorrhagic anemia                           Q43.8, Other specified congenital malformations of                            intestine CPT copyright 2016 American Medical Association. All rights reserved. The codes documented in this report are preliminary and upon coder review may  be revised to  meet current compliance requirements. Barney Drain, MD Barney Drain, MD 07/17/2017 4:23:36 PM This report has been signed electronically. Number of Addenda: 0

## 2017-07-17 NOTE — Interval H&P Note (Signed)
History and Physical Interval Note:  07/17/2017 2:27 PM  Luis Stewart  has presented today for surgery, with the diagnosis of GI bleed, anemia  The various methods of treatment have been discussed with the patient and family. After consideration of risks, benefits and other options for treatment, the patient has consented to  Procedure(s): COLONOSCOPY (N/A) as a surgical intervention .  The patient's history has been reviewed, patient examined, no change in status, stable for surgery.  I have reviewed the patient's chart and labs.  Questions were answered to the patient's satisfaction.     [employer]

## 2017-07-17 NOTE — Progress Notes (Signed)
ANTICOAGULATION CONSULT NOTE - follow up  Pharmacy Consult for HEPARIN Indication: h/o DVT (was recently on Xarelto)  Allergies  Allergen Reactions  . Sulfa Antibiotics Other (See Comments)    Unknown reaction. Childhood reaction   Patient Measurements: Height: 6\' 2"  (188 cm) Weight: 170 lb (77.1 kg) IBW/kg (Calculated) : 82.2 HEPARIN DW (KG): 77.1  Vital Signs: Temp: 98.4 F (36.9 C) (07/27 0500) Temp Source: Oral (07/27 0500) BP: 117/60 (07/27 0500) Pulse Rate: 74 (07/27 0500)  Labs:  Recent Labs  07/15/17 1028  07/15/17 2023 07/16/17 0605 07/16/17 0608 07/17/17 0559  HGB 9.2*  --  8.1*  --  8.0* 7.3*  HCT 27.8*  --  24.1*  --  23.9* 22.1*  PLT 203  --  187  --  179 165  APTT  --   < > 70*  --  144* 94*  HEPARINUNFRC  --   < > 0.28* 0.76*  --  0.26*  CREATININE 1.04  --   --   --  1.00  --   < > = values in this interval not displayed. Estimated Creatinine Clearance: 79.2 mL/min (by C-G formula based on SCr of 1 mg/dL).  Medical History: Past Medical History:  Diagnosis Date  . Arthritis   . Asbestosis ([place])   . Cyst of kidney, acquired   . Headache   . History of fracture of leg    Right, childhood  . Hx of migraine headaches   . Hyperlipidemia   . Neuropathy   . PE (pulmonary thromboembolism) ([place])    After knee surgery  . Prostate cancer ([place]) 2015   Adenocarcinoma by biopsy   Medications:  Prescriptions Prior to Admission  Medication Sig Dispense Refill Last Dose  . acetaminophen (TYLENOL) 500 MG tablet Take 1,000 mg by mouth every 6 (six) hours as needed for mild pain or moderate pain.   Past Month at Unknown time  . Cholecalciferol (VITAMIN D) 2000 units CAPS Take 2,000 Units by mouth 3 (three) times a week.    07/14/2017 at Unknown time  . docusate sodium (COLACE) 100 MG capsule Take 200 mg by mouth daily.   07/14/2017 at Unknown time  . Multiple Vitamin (MULTIVITAMIN WITH MINERALS) TABS tablet Take 1 tablet by mouth daily. Centrum Silver for Men    07/14/2017 at Unknown time   Assessment: 66yo male on Xarelto as OP due to recent dx of DVT/PE.  Last dose of Xarelto reportedly 7/23.  Plan for EGD today per GI svc, D/W Roseanne Kaufman.  Asked to initiate Heparin in meantime with plans to stop  ~4 hrs prior to EGD.  Baseline aPTT and Heparin level obtained since pt recently on Xarelto. Will use aPTT to regulate heparin.  Pt admitted to hospital with melena so will dose on low end of range.  Heparin level is 0.26, APTT is 94 which is at goal. Heparin is being held currently for procedure.  Goal of Therapy:  Monitor platelets by anticoagulation protocol: Yes  APTT 66-103   Plan:   Continue Heparin at 1000 units/hr, (holding prior to procedure)  F/U plans post procedure  Isac Sarna, BS Vena [place], BCPS Clinical Pharmacist Pager [phone] 07/17/2017 10:08 AM

## 2017-07-17 NOTE — Telephone Encounter (Signed)
07/17/17  Pt called to cx said that he was in the hospital

## 2017-07-18 ENCOUNTER — Observation Stay (HOSPITAL_COMMUNITY): Payer: Medicare Other

## 2017-07-18 ENCOUNTER — Encounter (HOSPITAL_COMMUNITY): Payer: Self-pay | Admitting: Radiology

## 2017-07-18 DIAGNOSIS — D62 Acute posthemorrhagic anemia: Secondary | ICD-10-CM | POA: Diagnosis not present

## 2017-07-18 DIAGNOSIS — K921 Melena: Secondary | ICD-10-CM | POA: Diagnosis not present

## 2017-07-18 DIAGNOSIS — K648 Other hemorrhoids: Secondary | ICD-10-CM | POA: Diagnosis not present

## 2017-07-18 DIAGNOSIS — D122 Benign neoplasm of ascending colon: Secondary | ICD-10-CM | POA: Diagnosis not present

## 2017-07-18 DIAGNOSIS — I2601 Septic pulmonary embolism with acute cor pulmonale: Secondary | ICD-10-CM | POA: Diagnosis not present

## 2017-07-18 DIAGNOSIS — N281 Cyst of kidney, acquired: Secondary | ICD-10-CM | POA: Diagnosis not present

## 2017-07-18 LAB — CBC
HEMATOCRIT: 24.1 % — AB (ref 39.0–52.0)
HEMOGLOBIN: 8.1 g/dL — AB (ref 13.0–17.0)
MCH: 30.8 pg (ref 26.0–34.0)
MCHC: 33.6 g/dL (ref 30.0–36.0)
MCV: 91.6 fL (ref 78.0–100.0)
Platelets: 178 10*3/uL (ref 150–400)
RBC: 2.63 MIL/uL — AB (ref 4.22–5.81)
RDW: 16.9 % — ABNORMAL HIGH (ref 11.5–15.5)
WBC: 4.4 10*3/uL (ref 4.0–10.5)

## 2017-07-18 LAB — TYPE AND SCREEN
ABO/RH(D): A POS
Antibody Screen: NEGATIVE
Unit division: 0

## 2017-07-18 LAB — BPAM RBC
Blood Product Expiration Date: 201808112359
ISSUE DATE / TIME: 201807271700
Unit Type and Rh: 6200

## 2017-07-18 LAB — COMPREHENSIVE METABOLIC PANEL
ALBUMIN: 2.7 g/dL — AB (ref 3.5–5.0)
ALT: 10 U/L — AB (ref 17–63)
AST: 14 U/L — AB (ref 15–41)
Alkaline Phosphatase: 60 U/L (ref 38–126)
Anion gap: 6 (ref 5–15)
BUN: 9 mg/dL (ref 6–20)
CHLORIDE: 108 mmol/L (ref 101–111)
CO2: 27 mmol/L (ref 22–32)
CREATININE: 0.99 mg/dL (ref 0.61–1.24)
Calcium: 8.5 mg/dL — ABNORMAL LOW (ref 8.9–10.3)
GFR calc Af Amer: >60 mL/min (ref >60)
GLUCOSE: 98 mg/dL (ref 65–99)
Potassium: 3.6 mmol/L (ref 3.5–5.1)
SODIUM: 141 mmol/L (ref 135–145)
Total Bilirubin: 0.4 mg/dL (ref 0.3–1.2)
Total Protein: 5.2 g/dL — ABNORMAL LOW (ref 6.5–8.1)

## 2017-07-18 LAB — MAGNESIUM: MAGNESIUM: 1.8 mg/dL (ref 1.7–2.4)

## 2017-07-18 LAB — PROTIME-INR
INR: 1.25
Prothrombin Time: 15.8 seconds — ABNORMAL HIGH (ref 11.4–15.2)

## 2017-07-18 LAB — HEPARIN LEVEL (UNFRACTIONATED): HEPARIN UNFRACTIONATED: 0.4 [IU]/mL (ref 0.30–0.70)

## 2017-07-18 LAB — APTT: aPTT: 106 seconds — ABNORMAL HIGH (ref 24–36)

## 2017-07-18 MED ORDER — SODIUM CHLORIDE 0.9 % IV SOLN
INTRAVENOUS | Status: AC
  Administered 2017-07-18: 12:00:00 via INTRAVENOUS

## 2017-07-18 MED ORDER — APIXABAN 5 MG PO TABS
5.0000 mg | ORAL_TABLET | Freq: Two times a day (BID) | ORAL | Status: DC
  Administered 2017-07-18 – 2017-07-19 (×3): 5 mg via ORAL
  Filled 2017-07-18 (×3): qty 1

## 2017-07-18 MED ORDER — IOPAMIDOL (ISOVUE-300) INJECTION 61%
INTRAVENOUS | Status: AC
  Filled 2017-07-18: qty 30

## 2017-07-18 MED ORDER — IOPAMIDOL (ISOVUE-300) INJECTION 61%
100.0000 mL | Freq: Once | INTRAVENOUS | Status: AC | PRN
  Administered 2017-07-18: 100 mL via INTRAVENOUS

## 2017-07-18 NOTE — Progress Notes (Signed)
[facility] Progress Note  Patient: Luis Stewart ACZ:[id number]   PCP: Alycia Rossetti, MD DOB: [birth year]   DOA: 07/15/2017   DOS: 07/18/2017   Date of Service: the patient was seen and examined on 07/18/2017  Subjective:  Denies melena. Last episode of bright red blood per rectum was yesterday morning  Brief hospital course: Pt. with PMH of PE 06/18/2017 after knee surgery,Prostate cancer, dyslipidemia ; admitted on 07/15/2017, presented with complaint of melena, was found to have symptomatic anemia due to GI bleed. Currently further plan is continue further workup.  Assessment and Plan: 1. Symptomatic anemia. Melena. Patient presented with melena as well as hemoglobin 9.2. Baseline is 11. Underwent EGD which was unremarkable, capsule endoscopy which is also unremarkable. Patient is status post colonoscopy -no high-risk lesion identified, repeat colonoscopy indicated in 5-10 years No External Bleeding Reported, Hemoglobin Dropped to 7.3. Status post one more unit of packed red blood cells Hemoglobin now 8.1, GI recommends to avoid Xarelto, Will start the patient on eliquis and monitor for bleeding for another 24 hours Anticipate discharge tomorrow  2. Acute DVT PE. Provoked after knee surgery Diagnosed 6/28, has been on   Xarelto. Patient bridged with IV heparin-no melena overnight, transitioned to eliquis, which is better risk profile for bleeding    3. Relative vitamin B-12 deficiency. Vitamin B-12 level 301. With acute anemia will give 1 dose of subcutaneous vitamin B-12.   4. Abdominal pain. Weight loss patient does have mild abdominal pain, since his GI workup has been negative so far, will order CT abdomen pelvis with contrast, to rule out underlying malignancy    Diet: Nothing by mouth DVT Prophylaxis: mechanical compression device  Advance goals of care discussion: full code  Family Communication: family was present at bedside, at the time of interview.  The pt provided permission to discuss medical plan with the family. Opportunity was given to ask question and all questions were answered satisfactorily.   Disposition:  Discharge to home.  Consultants: gastroenterology Procedures: PRBC, capsule endoscopy, EGD  Antibiotics: Anti-infectives    None       Objective: Physical Exam: Vitals:   07/17/17 1705 07/17/17 1745 07/17/17 2014 07/18/17 0659  BP: (!) 109/55 (!) 105/53 (!) 104/54 (!) 100/51  Pulse: 73 71 82 65  Resp: 18 18 18 16   Temp: 98.4 F (36.9 C) 98.1 F (36.7 C) 98.3 F (36.8 C) 98.9 F (37.2 C)  TempSrc: Oral Oral Oral Oral  SpO2: 100% 100% 100% 100%  Weight:      Height:        Intake/Output Summary (Last 24 hours) at 07/18/17 1046 Last data filed at 07/17/17 2014  Gross per 24 hour  Intake              490 ml  Output                0 ml  Net              490 ml   Filed Weights   07/15/17 0923 07/15/17 1300  Weight: 77.5 kg (170 lb 12 oz) 77.1 kg (170 lb)   General: Alert, Awake and Oriented to Time, Place and Person. Appear in mild distress, affect appropriate Eyes: PERRL, Conjunctiva normal ENT: Oral Mucosa clear moist. Neck: no JVD, no Abnormal Mass Or lumps Cardiovascular: S1 and S2 Present, no Murmur, Peripheral Pulses Present Respiratory: normal respiratory effort, Bilateral Air entry equal and Decreased, no use of accessory muscle, noClear to Auscultation,  no Crackles, no wheezes Abdomen: Bowel Sound present, Soft and no tenderness, no hernia Skin: no redness, no Rash, no induration Extremities: no Pedal edema, no calf tenderness Neurologic: Grossly no focal neuro deficit. Bilaterally Equal motor strength  Data Reviewed: CBC:  Recent Labs Lab 07/13/17 1259 07/15/17 1028 07/15/17 2023 07/16/17 0608 07/17/17 0559 07/17/17 1306 07/18/17 0623  WBC 6.3 6.0 4.7 4.9 4.1 4.0 4.4  NEUTROABS 4,473 4.0  --   --   --   --   --   HGB 9.3* 9.2* 8.1* 8.0* 7.3* 8.9* 8.1*  HCT 29.2* 27.8* 24.1*  23.9* 22.1* 26.8* 24.1*  MCV 94.5 92.4 92.7 91.6 92.5 92.7 91.6  PLT 202 203 187 179 165 215 355   Basic Metabolic Panel:  Recent Labs Lab 07/15/17 1028 07/16/17 0608 07/18/17 0623  NA 140 139 141  K 3.9 3.9 3.6  CL 104 105 108  CO2 28 29 27   GLUCOSE 108* 105* 98  BUN 17 15 9   CREATININE 1.04 1.00 0.99  CALCIUM 9.0 8.7* 8.5*  MG  --   --  1.8    Liver Function Tests:  Recent Labs Lab 07/15/17 1028 07/18/17 0623  AST 17 14*  ALT 10* 10*  ALKPHOS 74 60  BILITOT 0.6 0.4  PROT 6.7 5.2*  ALBUMIN 3.4* 2.7*   No results for input(s): LIPASE, AMYLASE in the last 168 hours. No results for input(s): AMMONIA in the last 168 hours. Coagulation Profile:  Recent Labs Lab 07/18/17 0623  INR 1.25   Cardiac Enzymes: No results for input(s): CKTOTAL, CKMB, CKMBINDEX, TROPONINI in the last 168 hours. BNP (last 3 results) No results for input(s): PROBNP in the last 8760 hours. CBG: No results for input(s): GLUCAP in the last 168 hours. Studies: US Venous Img Lower Bilateral  Result Date: 07/17/2017 CLINICAL DATA:  History of DVT within the right lower extremity. History of prostate cancer. Evaluate for acute or chronic DVT. EXAM: BILATERAL LOWER EXTREMITY VENOUS DOPPLER ULTRASOUND TECHNIQUE: Gray-scale sonography with graded compression, as well as color Doppler and duplex ultrasound were performed to evaluate the lower extremity deep venous systems from the level of the common femoral vein and including the common femoral, femoral, profunda femoral, popliteal and calf veins including the posterior tibial, peroneal and gastrocnemius veins when visible. The superficial great saphenous vein was also interrogated. Spectral Doppler was utilized to evaluate flow at rest and with distal augmentation maneuvers in the common femoral, femoral and popliteal veins. COMPARISON:  Bilateral lower extremity venous Doppler ultrasound - 06/18/2017 FINDINGS: RIGHT LOWER EXTREMITY Common Femoral Vein:  No evidence of thrombus. Normal compressibility, respiratory phasicity and response to augmentation. Saphenofemoral Junction: No evidence of thrombus. Normal compressibility and flow on color Doppler imaging. Profunda Femoral Vein: No evidence of thrombus. Normal compressibility and flow on color Doppler imaging. Femoral Vein: No evidence of thrombus. Normal compressibility, respiratory phasicity and response to augmentation. Popliteal Vein: No evidence of thrombus. Normal compressibility, respiratory phasicity and response to augmentation. Calf Veins: No evidence of acute or chronic thrombus. Normal compressibility and flow on color Doppler imaging. Superficial Great Saphenous Vein: No evidence of thrombus. Normal compressibility and flow on color Doppler imaging. Venous Reflux:  None. Other Findings:  None. LEFT LOWER EXTREMITY Common Femoral Vein: No evidence of thrombus. Normal compressibility, respiratory phasicity and response to augmentation. Saphenofemoral Junction: No evidence of thrombus. Normal compressibility and flow on color Doppler imaging. Profunda Femoral Vein: No evidence of thrombus. Normal compressibility and flow on color Doppler imaging.  Femoral Vein: No evidence of thrombus. Normal compressibility, respiratory phasicity and response to augmentation. Popliteal Vein: No evidence of thrombus. Normal compressibility, respiratory phasicity and response to augmentation. Calf Veins: No evidence of thrombus. Normal compressibility and flow on color Doppler imaging. Superficial Great Saphenous Vein: No evidence of thrombus. Normal compressibility and flow on color Doppler imaging. Venous Reflux:  None. Other Findings:  None. IMPRESSION: No evidence acute or chronic DVT within either lower extremity with special attention paid to the right posterior tibial and peroneal veins. Electronically Signed   By: Sandi Mariscal M.D.   On: 07/17/2017 14:40    Scheduled Meds: . pantoprazole  40 mg Oral QAC  breakfast   Continuous Infusions: . sodium chloride 75 mL/hr at 07/16/17 2341  . sodium chloride    . heparin 1,000 Units/hr (07/17/17 1841)   PRN Meds: ondansetron **OR** ondansetron (ZOFRAN) IV, senna-docusate  Time spent: 35 minutes  Author: Reyne Dumas MD   [facility]     07/18/2017 10:46 AM  If 7PM-7AM, please contact night-coverage at [email or web address], password [facility]

## 2017-07-18 NOTE — Progress Notes (Signed)
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 Sulfa Antibiotics Other (See Comments)    Unknown reaction. Childhood reaction   Patient Measurements: Height: 6\' 2"  (188 cm) Weight: 170 lb (77.1 kg) IBW/kg (Calculated) : 82.2 HEPARIN DW (KG): 77.1  Vital Signs: Temp: 98.9 F (37.2 C) (07/28 0659) Temp Source: Oral (07/28 0659) BP: 100/51 (07/28 0659) Pulse Rate: 65 (07/28 0659)  Labs:  Recent Labs  07/16/17 0605 07/16/17 4827 07/17/17 0559 07/17/17 1306 07/18/17 0623  HGB  --  8.0* 7.3* 8.9* 8.1*  HCT  --  23.9* 22.1* 26.8* 24.1*  PLT  --  179 165 215 178  APTT  --  144* 94*  --  106*  LABPROT  --   --   --   --  15.8*  INR  --   --   --   --  1.25  HEPARINUNFRC 0.76*  --  0.26*  --  0.40  CREATININE  --  1.00  --   --  0.99   Estimated Creatinine Clearance: 80 mL/min (by C-G formula based on SCr of 0.99 mg/dL).  Medical History: Past Medical History:  Diagnosis Date  . Arthritis   . Asbestosis ([place])   . Cyst of kidney, acquired   . Headache   . History of fracture of leg    Right, childhood  . Hx of migraine headaches   . Hyperlipidemia   . Neuropathy   . PE (pulmonary thromboembolism) ([place])    After knee surgery  . Prostate cancer ([place]) 2015   Adenocarcinoma by biopsy   Medications:  Prescriptions Prior to Admission  Medication Sig Dispense Refill Last Dose  . acetaminophen (TYLENOL) 500 MG tablet Take 1,000 mg by mouth every 6 (six) hours as needed for mild pain or moderate pain.   Past Month at Unknown time  . Cholecalciferol (VITAMIN D) 2000 units CAPS Take 2,000 Units by mouth 3 (three) times a week.    07/14/2017 at Unknown time  . docusate sodium (COLACE) 100 MG capsule Take 200 mg by mouth daily.   07/14/2017 at Unknown time  . Multiple Vitamin (MULTIVITAMIN WITH MINERALS) TABS tablet Take 1 tablet by mouth daily. Centrum Silver for  Men   07/14/2017 at Unknown time   Assessment: 66yo male on Xarelto as OP due to recent dx of DVT/PE.  Last dose of Xarelto reportedly 7/23.Patient presented with melena as well as hemoglobin 9.2. Baseline is 11.  Underwent EGD which was unremarkable, capsule endoscopy which is also unremarkable. Patient is status post colonoscopy -no high-risk lesion identified. GI recommends to avoid Xarelto. Patient was bridged with IV heparin-no melena overnight, plan is to transition to eliquis.   Goal of Therapy:  Monitor platelets by anticoagulation protocol: Yes    Plan:  Discontinue Heparin Start eliquis 5mg  po BID Educate on eliquis Monitor for S/S of bleeding  Isac Sarna, BS Vena [place], BCPS Clinical Pharmacist Pager [phone] 07/18/2017 11:01 AM

## 2017-07-18 NOTE — Progress Notes (Addendum)
Patient ID: Luis Stewart, male   DOB: 05-Mar-1951, 66 y.o.   MRN: [id number]   Assessment/Plan: ADMITTED WITH GI BLEED ON XARELTO. ETIOLOGY UNKNOWN-EGD/GIVENS/TCS DID NOT SHOW SOURCE OF BLEEDING, BUT NO HIGH RISK LESION IDENTIFIED.  PLAN: 1. SUPPORTIVE CARE 2. MONITOR CBC AND FOR MELENA/BRBPR 3. DISCUSSED PROCEDURE, BENEFITS, RISKS, AND MANAGEMENT OF ANTI-COAGULATION FOR DVT/PE WITH PT. PATIENT VOICED HIS UNDERSTANDING. 4. REVIEWED TCS FINDINGS. .   Subjective: Since I last evaluated the patient HE HAD TCS. NO SOURCE FOR MELENA IDENTIFIED. BIL LE U/S SHOWS NO DVT. NO BRBPR OR MELENA.  Objective: Vital signs in last 24 hours: Vitals:   07/17/17 2014 07/18/17 0659  BP: (!) 104/54 (!) 100/51  Pulse: 82 65  Resp: 18 16  Temp: 98.3 F (36.8 C) 98.9 F (37.2 C)   General appearance: alert, cooperative and no distress Resp: clear to auscultation bilaterally Cardio: regular rate and rhythm GI: soft, non-tender; bowel sounds normal;   Lab Results:  Cr 0.99 Hb 8.1 PLT 178 INR 1.25   Studies/Results: US Venous Img Lower Bilateral  Result Date: 07/17/2017 CLINICAL DATA:  History of DVT within the right lower extremity. History of prostate cancer. Evaluate for acute or chronic DVT. EXAM: BILATERAL LOWER EXTREMITY VENOUS DOPPLER ULTRASOUND TECHNIQUE: Gray-scale sonography with graded compression, as well as color Doppler and duplex ultrasound were performed to evaluate the lower extremity deep venous systems from the level of the common femoral vein and including the common femoral, femoral, profunda femoral, popliteal and calf veins including the posterior tibial, peroneal and gastrocnemius veins when visible. The superficial great saphenous vein was also interrogated. Spectral Doppler was utilized to evaluate flow at rest and with distal augmentation maneuvers in the common femoral, femoral and popliteal veins. COMPARISON:  Bilateral lower extremity venous Doppler ultrasound -  06/18/2017 FINDINGS: RIGHT LOWER EXTREMITY Common Femoral Vein: No evidence of thrombus. Normal compressibility, respiratory phasicity and response to augmentation. Saphenofemoral Junction: No evidence of thrombus. Normal compressibility and flow on color Doppler imaging. Profunda Femoral Vein: No evidence of thrombus. Normal compressibility and flow on color Doppler imaging. Femoral Vein: No evidence of thrombus. Normal compressibility, respiratory phasicity and response to augmentation. Popliteal Vein: No evidence of thrombus. Normal compressibility, respiratory phasicity and response to augmentation. Calf Veins: No evidence of acute or chronic thrombus. Normal compressibility and flow on color Doppler imaging. Superficial Great Saphenous Vein: No evidence of thrombus. Normal compressibility and flow on color Doppler imaging. Venous Reflux:  None. Other Findings:  None. LEFT LOWER EXTREMITY Common Femoral Vein: No evidence of thrombus. Normal compressibility, respiratory phasicity and response to augmentation. Saphenofemoral Junction: No evidence of thrombus. Normal compressibility and flow on color Doppler imaging. Profunda Femoral Vein: No evidence of thrombus. Normal compressibility and flow on color Doppler imaging. Femoral Vein: No evidence of thrombus. Normal compressibility, respiratory phasicity and response to augmentation. Popliteal Vein: No evidence of thrombus. Normal compressibility, respiratory phasicity and response to augmentation. Calf Veins: No evidence of thrombus. Normal compressibility and flow on color Doppler imaging. Superficial Great Saphenous Vein: No evidence of thrombus. Normal compressibility and flow on color Doppler imaging. Venous Reflux:  None. Other Findings:  None. IMPRESSION: No evidence acute or chronic DVT within either lower extremity with special attention paid to the right posterior tibial and peroneal veins. Electronically Signed   By: Jaja Switalski Mariscal M.D.   On: 07/17/2017  14:40    Medications: I have reviewed the patient's current medications.   LOS: 5 days   Barney Drain 06/01/2014,  2:23 PM

## 2017-07-19 ENCOUNTER — Telehealth: Payer: Self-pay | Admitting: Gastroenterology

## 2017-07-19 DIAGNOSIS — D62 Acute posthemorrhagic anemia: Secondary | ICD-10-CM | POA: Diagnosis not present

## 2017-07-19 DIAGNOSIS — I2699 Other pulmonary embolism without acute cor pulmonale: Secondary | ICD-10-CM

## 2017-07-19 DIAGNOSIS — I82401 Acute embolism and thrombosis of unspecified deep veins of right lower extremity: Secondary | ICD-10-CM | POA: Diagnosis not present

## 2017-07-19 DIAGNOSIS — R911 Solitary pulmonary nodule: Secondary | ICD-10-CM | POA: Diagnosis not present

## 2017-07-19 DIAGNOSIS — K921 Melena: Secondary | ICD-10-CM | POA: Diagnosis not present

## 2017-07-19 LAB — CBC
HCT: 25.1 % — ABNORMAL LOW (ref 39.0–52.0)
Hemoglobin: 8.4 g/dL — ABNORMAL LOW (ref 13.0–17.0)
MCH: 30.5 pg (ref 26.0–34.0)
MCHC: 33.5 g/dL (ref 30.0–36.0)
MCV: 91.3 fL (ref 78.0–100.0)
PLATELETS: 182 10*3/uL (ref 150–400)
RBC: 2.75 MIL/uL — ABNORMAL LOW (ref 4.22–5.81)
RDW: 16.7 % — AB (ref 11.5–15.5)
WBC: 3.5 10*3/uL — ABNORMAL LOW (ref 4.0–10.5)

## 2017-07-19 MED ORDER — APIXABAN 5 MG PO TABS: 5.0000 mg | ORAL_TABLET | Freq: Two times a day (BID) | ORAL | 0 refills | Status: DC

## 2017-07-19 NOTE — Discharge Instructions (Signed)
Apixaban oral tablets °What is this medicine? °APIXABAN (a PIX a ban) is an anticoagulant (blood thinner). It is used to lower the chance of stroke in people with a medical condition called atrial fibrillation. It is also used to treat or prevent blood clots in the lungs or in the veins. °This medicine may be used for other purposes; ask your health care provider or pharmacist if you have questions. °COMMON BRAND NAME(S): Eliquis °What should I tell my health care provider before I take this medicine? °They need to know if you have any of these conditions: °-bleeding disorders °-bleeding in the brain °-blood in your stools (black or tarry stools) or if you have blood in your vomit °-history of stomach bleeding °-kidney disease °-liver disease °-mechanical heart valve °-an unusual or allergic reaction to apixaban, other medicines, foods, dyes, or preservatives °-pregnant or trying to get pregnant °-breast-feeding °How should I use this medicine? °Take this medicine by mouth with a glass of water. Follow the directions on the prescription label. You can take it with or without food. If it upsets your stomach, take it with food. Take your medicine at regular intervals. Do not take it more often than directed. Do not stop taking except on your doctor's advice. Stopping this medicine may increase your risk of a blot clot. Be sure to refill your prescription before you run out of medicine. °Talk to your pediatrician regarding the use of this medicine in children. Special care may be needed. °Overdosage: If you think you have taken too much of this medicine contact a poison control center or emergency room at once. °NOTE: This medicine is only for you. Do not share this medicine with others. °What if I miss a dose? °If you miss a dose, take it as soon as you can. If it is almost time for your next dose, take only that dose. Do not take double or extra doses. °What may interact with this medicine? °This medicine may  interact with the following: °-aspirin and aspirin-like medicines °-certain medicines for fungal infections like ketoconazole and itraconazole °-certain medicines for seizures like carbamazepine and phenytoin °-certain medicines that treat or prevent blood clots like warfarin, enoxaparin, and dalteparin °-clarithromycin °-NSAIDs, medicines for pain and inflammation, like ibuprofen or naproxen °-rifampin °-ritonavir °-St. John's wort °This list may not describe all possible interactions. Give your health care provider a list of all the medicines, herbs, non-prescription drugs, or dietary supplements you use. Also tell them if you smoke, drink alcohol, or use illegal drugs. Some items may interact with your medicine. °What should I watch for while using this medicine? °Visit your doctor or health care professional for regular checks on your progress. °Notify your doctor or health care professional and seek emergency treatment if you develop breathing problems; changes in vision; chest pain; severe, sudden headache; pain, swelling, warmth in the leg; trouble speaking; sudden numbness or weakness of the face, arm or leg. These can be signs that your condition has gotten worse. °If you are going to have surgery or other procedure, tell your doctor that you are taking this medicine. °What side effects may I notice from receiving this medicine? °Side effects that you should report to your doctor or health care professional as soon as possible: °-allergic reactions like skin rash, itching or hives, swelling of the face, lips, or tongue °-signs and symptoms of bleeding such as bloody or black, tarry stools; red or dark-brown urine; spitting up blood or brown material that looks like coffee   grounds; red spots on the skin; unusual bruising or bleeding from the eye, gums, or nose °This list may not describe all possible side effects. Call your doctor for medical advice about side effects. You may report side effects to FDA at  1-800-FDA-1088. °Where should I keep my medicine? °Keep out of the reach of children. °Store at room temperature between 20 and 25 degrees C (68 and 77 degrees F). Throw away any unused medicine after the expiration date. °NOTE: This sheet is a summary. It may not cover all possible information. If you have questions about this medicine, talk to your doctor, pharmacist, or health care provider. °© 2018 [employer] (2016-06-30 11:54:23) °  ° °

## 2017-07-19 NOTE — Discharge Summary (Signed)
Physician Discharge Summary  VEGAS FRITZE HAL:[id number] DOB: Sep 26, 1951 DOA: 07/15/2017  PCP: Alycia Rossetti, MD  Admit date: 07/15/2017 Discharge date: 07/19/2017  Admitted From: Home Disposition: Home  Recommendations for Outpatient Follow-up:  #1 Follow up with PCP Within 1 week. Please check patient's hemoglobin during outpatient follow-up. #2 Patient has an incidental finding of 1.8 cm right lower lung nodule. It is recommended that he either get a repeat CT scanning of the chest in 3 months or PET-CT or tissue biopsy. Patient has a history of asbestos exposure and has an appointment to see his pulmonologist Dr. Melvyn Novas in October.    Home Health: None Equipment/Devices: None  Discharge Condition: Stable CODE STATUS: Full code Diet recommendation: Regular   Discharge Diagnoses:  Principal Problem:   Acute blood loss anemia   Active Problems:   Pulmonary embolism (HCC)   Melena   Pulmonary nodule   Brief narrative/history of present illness Please refer to admission H&P for details, in brief, 66 year old male with recently diagnosed PE (one month back) after his knee surgery, prostate cancer, dyslipidemia admitted with melena and found to have symptomatic anemia due to GI bleed.  Principal problem Acute blood loss anemia/symptomatic anemia -Hemoglobin of 8-9 on presentation with baseline around 11. He underwent EGD which was unremarkable. He then underwent capsule endoscopy which did not show any source of bleeding. He then had colonoscopy which showed external and internal hemorrhoids without bleeding and again no source of bleeding. -His hemoglobin dropped to 7.3 and received 1 unit PRBC with improvement (hemoglobin of 8.3 today). -GI consult appreciated. Recommended to avoid Xarelto and was started on eliquis ( loading dose was skipped given his acute blood loss anemia and that patient was on Xarelto already as outpatient).  Patient has had no further melena,  hematemesis, denies headache, dizziness, chest pain, shortness of breath or palpitations. Patient instructed to return to the hospital if he has similar symptoms.  Acute DVT/PE Provoked after recent knee surgery. Xarelto switched to eliquis. Treating for total 3 months (until September 28th)  Right lung pulmonary nodule Incidental finding of 1.8 cm nodule. Given history of asbestos exposure recommend either repeat chest CT in 3 months versus PET-CT or tissue biopsy. Patient has appointment to see his pulmonologist in October.   Low normal vitamin B12 Level of 301. Received a dose of subcutaneous vitamin B12.  Abdominal pain with weight loss. CT of the abdomen without any mass, shows high stool burden. Continue colace.  Consults: GI (Dr. Oneida Alar)  Procedures: CT abdomen EGD Colonoscopy   capsule study endoscopy  Family communication: Wife at bedside  Discharge Instructions   Allergies as of 07/19/2017      Reactions   Sulfa Antibiotics Other (See Comments)   Unknown reaction. Childhood reaction      Medication List    TAKE these medications   acetaminophen 500 MG tablet Commonly known as:  TYLENOL Take 1,000 mg by mouth every 6 (six) hours as needed for mild pain or moderate pain.   apixaban 5 MG Tabs tablet Commonly known as:  ELIQUIS Take 1 tablet (5 mg total) by mouth 2 (two) times daily.   docusate sodium 100 MG capsule Commonly known as:  COLACE Take 200 mg by mouth daily.   multivitamin with minerals Tabs tablet Take 1 tablet by mouth daily. Centrum Silver for Men   Vitamin D 2000 units Caps Take 2,000 Units by mouth 3 (three) times a week.      Follow-up Information  Buelah Manis, Modena Nunnery, MD Follow up in 1 week(s).   Specialty:  Family Medicine Contact information: [address] [place] [address] [phone]          Allergies  Allergen Reactions  . Sulfa Antibiotics Other (See Comments)    Unknown reaction. Childhood reaction       Procedures/Studies: Ct Abdomen Pelvis W Contrast  Result Date: 07/18/2017 CLINICAL DATA:  Abdominal cramping, weight loss and 1 instance of bloody stools. History of prostate cancer. EXAM: CT ABDOMEN AND PELVIS WITH CONTRAST TECHNIQUE: Multidetector CT imaging of the abdomen and pelvis was performed using the standard protocol following bolus administration of intravenous contrast. CONTRAST:  179mL ISOVUE-300 IOPAMIDOL (ISOVUE-300) INJECTION 61% COMPARISON:  06/17/2018 FINDINGS: Lower chest: 1.8 cm airspace consolidation in the dependent portion of the right lobe of the lung. Hepatobiliary: Stable appearance of numerous cystic lesions throughout the liver, some consistent with cysts, some too small to be actually characterize by CT. Pancreas: Unremarkable. No pancreatic ductal dilatation or surrounding inflammatory changes. Spleen: Normal in size without focal abnormality. Adrenals/Urinary Tract: Adrenal glands are unremarkable. Kidneys are normal, without renal calculi, focal lesion, or hydronephrosis. Bilateral renal cysts are stable. Mild diffuse wall thickening of the urinary bladder. Stomach/Bowel: Stomach is within normal limits. No evidence of appendicitis. Large amount of formed stool throughout the colon. No evidence of bowel wall thickening, distention, or inflammatory changes. Vascular/Lymphatic: Aortic atherosclerosis. No enlarged abdominal or pelvic lymph nodes. Reproductive: Marked enlargement and globular appearance of the prostate gland which measures 8.0 x 7.1 cm. Nodal enlargement of the prostate gland protrudes into the urinary bladder and causes mass effect to the posterior wall of the bladder. Other: No abdominal wall hernia or abnormality. No abdominopelvic ascites. Musculoskeletal: No acute osseous findings. 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 IMPRESSION: 1.8 cm nodular airspace opacity in the dependent portion of the right lower lobe. This may represent an area of  round atelectasis or a pulmonary nodule. Consider one of the following in 3 months for both low-risk and high-risk individuals: (a) repeat chest CT, (b) follow-up PET-CT, or (c) tissue sampling. This recommendation follows the consensus statement: Guidelines for Management of Incidental Pulmonary Nodules Detected on CT Images: From the Fleischner Society 2017; Radiology 2017; 284:228-243. Multiple cystic lesions throughout the liver, some of which are consistent with simple cysts, some too small to be accurately characterize by CT. Bilateral renal cysts. Large amount of formed stool throughout the colon prevents evaluation for potential soft tissue masses. Marked enlargement of the prostate gland, which causes mass effect to the base of the urinary bladder. Stable abnormal appearance of the pelvic bones, thought to represent Paget's disease. Electronically Signed   By: Fidela Salisbury M.D.   On: 07/18/2017 16:38   US Venous Img Lower Bilateral  Result Date: 07/17/2017 CLINICAL DATA:  History of DVT within the right lower extremity. History of prostate cancer. Evaluate for acute or chronic DVT. EXAM: BILATERAL LOWER EXTREMITY VENOUS DOPPLER ULTRASOUND TECHNIQUE: Gray-scale sonography with graded compression, as well as color Doppler and duplex ultrasound were performed to evaluate the lower extremity deep venous systems from the level of the common femoral vein and including the common femoral, femoral, profunda femoral, popliteal and calf veins including the posterior tibial, peroneal and gastrocnemius veins when visible. The superficial great saphenous vein was also interrogated. Spectral Doppler was utilized to evaluate flow at rest and with distal augmentation maneuvers in the common femoral, femoral and popliteal veins. COMPARISON:  Bilateral  lower extremity venous Doppler ultrasound - 06/18/2017 FINDINGS: RIGHT LOWER EXTREMITY Common Femoral Vein: No evidence of thrombus. Normal compressibility,  respiratory phasicity and response to augmentation. Saphenofemoral Junction: No evidence of thrombus. Normal compressibility and flow on color Doppler imaging. Profunda Femoral Vein: No evidence of thrombus. Normal compressibility and flow on color Doppler imaging. Femoral Vein: No evidence of thrombus. Normal compressibility, respiratory phasicity and response to augmentation. Popliteal Vein: No evidence of thrombus. Normal compressibility, respiratory phasicity and response to augmentation. Calf Veins: No evidence of acute or chronic thrombus. Normal compressibility and flow on color Doppler imaging. Superficial Great Saphenous Vein: No evidence of thrombus. Normal compressibility and flow on color Doppler imaging. Venous Reflux:  None. Other Findings:  None. LEFT LOWER EXTREMITY Common Femoral Vein: No evidence of thrombus. Normal compressibility, respiratory phasicity and response to augmentation. Saphenofemoral Junction: No evidence of thrombus. Normal compressibility and flow on color Doppler imaging. Profunda Femoral Vein: No evidence of thrombus. Normal compressibility and flow on color Doppler imaging. Femoral Vein: No evidence of thrombus. Normal compressibility, respiratory phasicity and response to augmentation. Popliteal Vein: No evidence of thrombus. Normal compressibility, respiratory phasicity and response to augmentation. Calf Veins: No evidence of thrombus. Normal compressibility and flow on color Doppler imaging. Superficial Great Saphenous Vein: No evidence of thrombus. Normal compressibility and flow on color Doppler imaging. Venous Reflux:  None. Other Findings:  None. IMPRESSION: No evidence acute or chronic DVT within either lower extremity with special attention paid to the right posterior tibial and peroneal veins. Electronically Signed   By: Sandi Mariscal M.D.   On: 07/17/2017 14:40       Subjective: Denies any abdominal pain or melena. Denies chest pain, palpitations or shortness of  breath or dizziness.  Discharge Exam: Vitals:   07/18/17 2102 07/19/17 0614  BP: 113/61 104/62  Pulse: 73 86  Resp: 18 16  Temp: 99.1 F (37.3 C) 98.6 F (37 C)   Vitals:   07/18/17 1334 07/18/17 2102 07/18/17 2231 07/19/17 0614  BP: 114/64 113/61  104/62  Pulse: 71 73  86  Resp: 18 18  16   Temp: 98.1 F (36.7 C) 99.1 F (37.3 C)  98.6 F (37 C)  TempSrc: Oral Oral  Oral  SpO2: 100% 100% 97% 100%  Weight:      Height:        General: Elderly male not in distress HEENT: Pallor present, moist mucosa, supple neck Chest: Clear to auscultation bilaterally CVS: Normal S1 and S2, no murmurs GI: Soft, nondistended, nontender Musculoskeletal: Warm, no edema     The results of significant diagnostics from this hospitalization (including imaging, microbiology, ancillary and laboratory) are listed below for reference.     Microbiology: No results found for this or any previous visit (from the past 240 hour(s)).   Labs: BNP (last 3 results) No results for input(s): BNP in the last 8760 hours. Basic Metabolic Panel:  Recent Labs Lab 07/15/17 1028 07/16/17 0608 07/18/17 0623  NA 140 139 141  K 3.9 3.9 3.6  CL 104 105 108  CO2 28 29 27   GLUCOSE 108* 105* 98  BUN 17 15 9   CREATININE 1.04 1.00 0.99  CALCIUM 9.0 8.7* 8.5*  MG  --   --  1.8   Liver Function Tests:  Recent Labs Lab 07/15/17 1028 07/18/17 0623  AST 17 14*  ALT 10* 10*  ALKPHOS 74 60  BILITOT 0.6 0.4  PROT 6.7 5.2*  ALBUMIN 3.4* 2.7*   No results for  input(s): LIPASE, AMYLASE in the last 168 hours. No results for input(s): AMMONIA in the last 168 hours. CBC:  Recent Labs Lab 07/13/17 1259 07/15/17 1028  07/16/17 0608 07/17/17 0559 07/17/17 1306 07/18/17 0623 07/19/17 0652  WBC 6.3 6.0  < > 4.9 4.1 4.0 4.4 3.5*  NEUTROABS 4,473 4.0  --   --   --   --   --   --   HGB 9.3* 9.2*  < > 8.0* 7.3* 8.9* 8.1* 8.4*  HCT 29.2* 27.8*  < > 23.9* 22.1* 26.8* 24.1* 25.1*  MCV 94.5 92.4  < > 91.6  92.5 92.7 91.6 91.3  PLT 202 203  < > 179 165 215 178 182  < > = values in this interval not displayed. Cardiac Enzymes: No results for input(s): CKTOTAL, CKMB, CKMBINDEX, TROPONINI in the last 168 hours. BNP: Invalid input(s): POCBNP CBG: No results for input(s): GLUCAP in the last 168 hours. D-Dimer No results for input(s): DDIMER in the last 72 hours. Hgb A1c No results for input(s): HGBA1C in the last 72 hours. Lipid Profile No results for input(s): CHOL, HDL, LDLCALC, TRIG, CHOLHDL, LDLDIRECT in the last 72 hours. Thyroid function studies No results for input(s): TSH, T4TOTAL, T3FREE, THYROIDAB in the last 72 hours.  Invalid input(s): FREET3 Anemia work up No results for input(s): VITAMINB12, FOLATE, FERRITIN, TIBC, IRON, RETICCTPCT in the last 72 hours. Urinalysis    Component Value Date/Time   COLORURINE YELLOW 06/17/2017 2352   APPEARANCEUR CLEAR 06/17/2017 2352   LABSPEC 1.013 06/17/2017 2352   PHURINE 5.0 06/17/2017 2352   GLUCOSEU NEGATIVE 06/17/2017 2352   HGBUR NEGATIVE 06/17/2017 2352   BILIRUBINUR NEGATIVE 06/17/2017 2352   KETONESUR NEGATIVE 06/17/2017 2352   PROTEINUR NEGATIVE 06/17/2017 2352   NITRITE NEGATIVE 06/17/2017 2352   LEUKOCYTESUR NEGATIVE 06/17/2017 2352   Sepsis Labs Invalid input(s): PROCALCITONIN,  WBC,  LACTICIDVEN Microbiology No results found for this or any previous visit (from the past 240 hour(s)).   Time coordinating discharge: Over 30 minutes  SIGNED:   Louellen Molder, MD  [facility] 07/19/2017, 10:35 AM Pager   If 7PM-7AM, please contact night-coverage [email or web address] Password [email or web address]

## 2017-07-19 NOTE — Progress Notes (Signed)
Discharge instructions gone over with patient and family, verbalized understanding. Printed prescription given to patient. IV's removed, patient tolerated procedure well.

## 2017-07-19 NOTE — Telephone Encounter (Signed)
NEXT TCS IN 5-10 YEARS

## 2017-07-20 ENCOUNTER — Telehealth: Payer: Self-pay

## 2017-07-20 ENCOUNTER — Ambulatory Visit (HOSPITAL_COMMUNITY): Payer: Medicare Other | Admitting: Physical Therapy

## 2017-07-20 DIAGNOSIS — R2681 Unsteadiness on feet: Secondary | ICD-10-CM | POA: Diagnosis not present

## 2017-07-20 DIAGNOSIS — R262 Difficulty in walking, not elsewhere classified: Secondary | ICD-10-CM

## 2017-07-20 DIAGNOSIS — R600 Localized edema: Secondary | ICD-10-CM | POA: Diagnosis not present

## 2017-07-20 DIAGNOSIS — M25661 Stiffness of right knee, not elsewhere classified: Secondary | ICD-10-CM

## 2017-07-20 DIAGNOSIS — D500 Iron deficiency anemia secondary to blood loss (chronic): Secondary | ICD-10-CM

## 2017-07-20 NOTE — Therapy (Signed)
[address] [place], [place], [address] Phone: [phone]   Fax:  [phone]  Physical Therapy Treatment  Patient Details  Name: Luis Stewart MRN: [id number] Date of Birth: [birth year] Referring Provider: Kathryne Hitch  Encounter Date: 07/20/2017      PT End of Session - 07/20/17 1027    Visit Number 10   Number of Visits 19   Date for PT Re-Evaluation 08/03/17   Authorization Type Medicare primary, BCBS supplementary (G-codes done 9th visit)   Authorization Time Period 06/22/17 to 08/03/17   Authorization - Visit Number 10   Authorization - Number of Visits 19   PT Start Time 0947   PT Stop Time 1027   PT Time Calculation (min) 40 min   Activity Tolerance Patient tolerated treatment well   Behavior During Therapy [facility] for tasks assessed/performed      Past Medical History:  Diagnosis Date  . Arthritis   . Asbestosis ([place])   . Cyst of kidney, acquired   . Headache   . History of fracture of leg    Right, childhood  . Hx of migraine headaches   . Hyperlipidemia   . Neuropathy   . PE (pulmonary thromboembolism) ([place])    After knee surgery  . Prostate cancer ([place]) 2015   Adenocarcinoma by biopsy    Past Surgical History:  Procedure Laterality Date  . BIOPSY PROSTATE  2003 and 2005  . COLONOSCOPY  11/28/2008   Dr. Rourk:internal hemorrhoids/tortus colon/ascending colon polyps, tubular adenomas  . COLONOSCOPY N/A 09/04/2014   Procedure: COLONOSCOPY;  Surgeon: Daneil Dolin, MD;  [facility];  Service: Endoscopy;  Laterality: N/A;  9:30  . TOTAL KNEE ARTHROPLASTY Right 06/10/2017  . TOTAL KNEE ARTHROPLASTY Right 06/10/2017   Procedure: TOTAL KNEE ARTHROPLASTY;  Surgeon: [name], MD;  Location: [facility];  Service: Orthopedics;  Laterality: Right;    There were no vitals filed for this visit.      Subjective Assessment - 07/20/17 0949    Subjective Patient arrives stating that he was  in the hospital this weekend, he had blood in his stool and dropping blood counts as well; he was checked out thoroughly and they could not find anything. He was taken off of blooder for a couple days and they also changed what type of blood thinner he is taking. Right now he is just to monitor himself and let his MD know if he has any more issues.    Pertinent History xarelto, history of PE, history of prostate cancer (just monitoring now), neuropathy    Patient Stated Goals get back to normal ASAP    Currently in Pain? No/denies            [facility] PT Assessment - 07/20/17 0001      AROM   Right Knee Extension 3   Right Knee Flexion 104                     [facility] Adult PT Treatment/Exercise - 07/20/17 0001      Knee/Hip Exercises: Stretches   Active Hamstring Stretch Right;3 reps;30 seconds   Active Hamstring Stretch Limitations 12 inch box    Knee: Self-Stretch to increase Flexion Right   Knee: Self-Stretch Limitations 15x15 second holds    Gastroc Stretch Both;3 reps;30 seconds   Gastroc Stretch Limitations slantboard      Knee/Hip Exercises: Aerobic   Stationary Bike rocking seat 19 x10 minutes EOS  not included in billing      Knee/Hip Exercises: Standing   Terminal Knee Extension Limitations x15, 3 second holds R LE      Manual Therapy   Manual Therapy Joint mobilization;Soft tissue mobilization   Manual therapy comments performed separately from all otehr skilled services    Joint Mobilization tib on femur flexion mobilization grade III, patella mobilization all directions    Soft tissue mobilization STM to R quad and ITB             Balance Exercises - 07/20/17 1018      Balance Exercises: Standing   Tandem Stance Eyes open;Foam/compliant surface;3 reps;20 secs   Rockerboard --   Marching Limitations 1x20 on foam   core and glute squeezes    Heel Raises Limitations 1x20 foam    Toe Raise Limitations 1x20 foam           PT Education -  07/20/17 1027    Education provided Yes   Education Details target ROM of at least 110    Person(s) Educated Patient   Methods Explanation   Comprehension Verbalized understanding          PT Short Term Goals - 07/13/17 1010      PT SHORT TERM GOAL #1   Title Patient to demonstrate R knee ROM as being 0-115 degrees in order to improve mechanics and assist in return to PLOF    Baseline 7/23- at best 0 extension and 103 flexion but today 3-90 possibly due to increased edema    Time 3   Period Weeks   Status On-going     PT SHORT TERM GOAL #2   Title Patient to be ambulating with no assistive device, consistent heel toe pattern, no unsteadiness to improve mobiilty and assist in return to PLOF    Baseline 7/23- achieved    Time 3   Period Weeks   Status Achieved     PT SHORT TERM GOAL #3   Title patient to be independent in correct performance of scar mobilization, patella mobility, and edema control in order to improve self-efficacy in managing condition    Baseline 7/23- has been controlling edema I, discussed patella and scar mobility today    Time 3   Period Weeks   Status Partially Met     PT SHORT TERM GOAL #4   Title Patient to be compliant with correct performance of HEP, to be updated PRN    Baseline 7/23- compliant    Time 1   Period Weeks   Status Achieved           PT Long Term Goals - 07/13/17 1014      PT LONG TERM GOAL #1   Title Patient to demonstrate functional strength as being 5/5 in all tested muscle groups in order to improve giat and balance    Baseline 7/23- hip extensors remain weak    Time 6   Period Weeks   Status Partially Met     PT LONG TERM GOAL #2   Title Patient to be able to complete TUG test in 10 seconds with no AD in order to show improved balance/reduced fall risk    Baseline 7/23- 9.9 no device    Time 6   Period Weeks   Status Achieved     PT LONG TERM GOAL #3   Title Patient to be able to perform mock-bowling exercise  in clinic with no LOB and good form in order to ensure  safety and success with individual attempt to return to bowling based tasks    Baseline 7/23- not attempted yet    Time 6   Period Weeks   Status On-going     PT LONG TERM GOAL #4   Title Patient to be able to ascend/descent full flight of stairs with U railing, no AD, reciprocal pattern, good eccentric control in order to show improved community and home access    Baseline 7/23- limited by R knee stiffness but able to perform reciprocal pattern    Time 6   Period Weeks   Status On-going               Plan - 07/20/17 1028    Clinical Impression Statement Patient arrives today after being hospitalized the majority of last week for GI issues/bloody stools; he reports they were unable to find any causative factors and he is just being monitored for now. Continued with manual for flexion ROM as well as flexion based mobility exercises this session. Noted ROM as being approximately 3-104 degrees today in supine; discussed target goal of at least 110 degrees flexion. Continued with bike at EOS, not included in billing.    Rehab Potential Good   Clinical Impairments Affecting Rehab Potential (+) motivated to participate in PT/return to PLOF, minimal PMH   PT Frequency 3x / week   PT Duration 3 weeks   PT Treatment/Interventions ADLs/Self Care Home Management;Biofeedback;Cryotherapy;DME Instruction;Gait training;Stair training;Functional mobility training;Therapeutic activities;Therapeutic exercise;Balance training;Neuromuscular re-education;Patient/family education;Manual techniques;Scar mobilization;Passive range of motion;Dry needling   PT Next Visit Plan continue aggressive ROM work for knee flexion, aggressive manual; focus on knee extension and hip extension strength. Progress CKC exercise when ROM is consistently at least 110 flexion    PT Home Exercise Plan Eval: quad sets, heel slides, lateral weight shfits, seated knee flexion  stretch; 7/18 prone quad stretch    Consulted and Agree with Plan of Care Patient      Patient will benefit from skilled therapeutic intervention in order to improve the following deficits and impairments:  Abnormal gait, Decreased skin integrity, Pain, Decreased mobility, Decreased scar mobility, Decreased activity tolerance, Decreased range of motion, Decreased strength, Hypomobility, Decreased balance, Difficulty walking, Increased edema, Impaired flexibility  Visit Diagnosis: Stiffness of right knee, not elsewhere classified  Localized edema  Difficulty in walking, not elsewhere classified  Unsteadiness on feet     Problem List Patient Active Problem List   Diagnosis Date Noted  . Pulmonary nodule 07/19/2017  . Acute deep vein thrombosis (DVT) of right lower extremity ([place])   . Heme + stool 07/15/2017  . Constipation 07/15/2017  . Melena 07/15/2017  . Acute blood loss anemia 07/15/2017  . Postoperative anemia 06/25/2017  . Pulmonary embolism ([place]) 06/18/2017  . Primary localized osteoarthritis of right knee 06/10/2017  . Upper airway cough syndrome 08/29/2015  . Prostate cancer ([place]) 08/17/2015  . Exposure to asbestos 08/17/2015  . Personal history of colonic polyps 08/08/2014  . Joint pain 06/05/2014  . Vitamin D deficiency 06/05/2014  . BPH (benign prostatic hypertrophy) 06/01/2013  . Mild hyperlipidemia 06/01/2013  . Routine general medical examination at a health care facility 06/01/2013    Deniece Ree PT, DPT [place] [address] [place], [place], [address] Phone: [phone]   Fax:  [phone]  Name: Luis Stewart MRN: [id number] Date of Birth: Oct 10, 1951

## 2017-07-20 NOTE — Telephone Encounter (Signed)
Pt said when he saw Luis Field, Luis Stewart, last week he had to be sent to the hospital. But Luis Stewart had mentioned something about putting him on Protonix. He was given Protonix in the hospital, but he was just checking to see if Luis Stewart wants to send in a prescription for the Protonix. He would like it sent to [facility] in [place]. His call back number is [phone]. He is aware that Luis Stewart is out until 48.

## 2017-07-21 ENCOUNTER — Encounter (HOSPITAL_COMMUNITY): Payer: Self-pay | Admitting: Gastroenterology

## 2017-07-21 DIAGNOSIS — M1711 Unilateral primary osteoarthritis, right knee: Secondary | ICD-10-CM | POA: Diagnosis not present

## 2017-07-21 MED ORDER — PANTOPRAZOLE SODIUM 40 MG PO TBEC: 40.0000 mg | DELAYED_RELEASE_TABLET | Freq: Two times a day (BID) | ORAL | 2 refills | Status: DC

## 2017-07-21 NOTE — Telephone Encounter (Signed)
Yeah, his EGD showed patchy erythema in his stomach. Let's do Protonix 40 mg bid for the next 3 months then reassess. Rx sent to pharmacy. Please notify the patient.

## 2017-07-21 NOTE — Progress Notes (Signed)
PT is aware.

## 2017-07-21 NOTE — Telephone Encounter (Signed)
PT is aware. Forwarding to [place] to schedule follow up OV.

## 2017-07-21 NOTE — Telephone Encounter (Signed)
Patient already scheduled for a follow up

## 2017-07-22 ENCOUNTER — Other Ambulatory Visit: Payer: Self-pay

## 2017-07-22 ENCOUNTER — Ambulatory Visit (HOSPITAL_COMMUNITY): Payer: Medicare Other | Attending: Orthopedic Surgery | Admitting: Physical Therapy

## 2017-07-22 DIAGNOSIS — R262 Difficulty in walking, not elsewhere classified: Secondary | ICD-10-CM | POA: Insufficient documentation

## 2017-07-22 DIAGNOSIS — R2681 Unsteadiness on feet: Secondary | ICD-10-CM

## 2017-07-22 DIAGNOSIS — M25661 Stiffness of right knee, not elsewhere classified: Secondary | ICD-10-CM | POA: Diagnosis not present

## 2017-07-22 DIAGNOSIS — R600 Localized edema: Secondary | ICD-10-CM

## 2017-07-22 NOTE — Therapy (Signed)
[place], [place], [address] Phone: [phone]   Fax:  [phone]  Physical Therapy Treatment  Patient Details  Name: Luis Stewart MRN: [id number] Date of Birth: September 05, 1951 Referring Provider: Kathryne Hitch  Encounter Date: 07/22/2017      PT End of Session - 07/22/17 1028    Visit Number 11   Number of Visits 19   Date for PT Re-Evaluation 08/03/17   Authorization Type Medicare primary, BCBS supplementary (G-codes done 9th visit)   Authorization Time Period 06/22/17 to 08/03/17   Authorization - Visit Number 11   Authorization - Number of Visits 19   PT Start Time 0950   PT Stop Time 1028   PT Time Calculation (min) 38 min   Activity Tolerance Patient tolerated treatment well   Behavior During Therapy [facility] for tasks assessed/performed      Past Medical History:  Diagnosis Date  . Arthritis   . Asbestosis ([place])   . Cyst of kidney, acquired   . Headache   . History of fracture of leg    Right, childhood  . Hx of migraine headaches   . Hyperlipidemia   . Neuropathy   . PE (pulmonary thromboembolism) ([place])    After knee surgery  . Prostate cancer ([place]) 2015   Adenocarcinoma by biopsy    Past Surgical History:  Procedure Laterality Date  . BIOPSY PROSTATE  2003 and 2005  . COLONOSCOPY  11/28/2008   Dr. Rourk:internal hemorrhoids/tortus colon/ascending colon polyps, tubular adenomas  . COLONOSCOPY N/A 09/04/2014   Procedure: COLONOSCOPY;  Surgeon: Daneil Dolin, MD;  [facility];  Service: Endoscopy;  Laterality: N/A;  9:30  . COLONOSCOPY N/A 07/17/2017   Procedure: COLONOSCOPY;  Surgeon: Danie Binder, MD;  [facility];  Service: Endoscopy;  Laterality: N/A;  . ESOPHAGOGASTRODUODENOSCOPY N/A 07/16/2017   Procedure: ESOPHAGOGASTRODUODENOSCOPY (EGD);  Surgeon: Danie Binder, MD;  [facility];  Service: Endoscopy;  Laterality: N/A;  . GIVENS CAPSULE STUDY   07/16/2017   Procedure: GIVENS CAPSULE STUDY;  Surgeon: Danie Binder, MD;  [facility];  Service: Endoscopy;;  . POLYPECTOMY  07/17/2017   Procedure: POLYPECTOMY;  Surgeon: Danie Binder, MD;  [facility];  Service: Endoscopy;;  cecal  . TOTAL KNEE ARTHROPLASTY Right 06/10/2017  . TOTAL KNEE ARTHROPLASTY Right 06/10/2017   Procedure: TOTAL KNEE ARTHROPLASTY;  Surgeon: [name], MD;  Location: [facility];  Service: Orthopedics;  Laterality: Right;    There were no vitals filed for this visit.      Subjective Assessment - 07/22/17 0951    Subjective Patient arrives stating he is doing well, he felt "worked" but not sore after last session. He saw his surgeon yesterday and they are happy with how he is doing thus far.    Pertinent History xarelto, history of PE, history of prostate cancer (just monitoring now), neuropathy    Patient Stated Goals get back to normal ASAP    Currently in Pain? No/denies                         [facility] Adult PT Treatment/Exercise - 07/22/17 0001      Knee/Hip Exercises: Stretches   Active Hamstring Stretch Right;3 reps;60 seconds   Active Hamstring Stretch Limitations 12 inch box    Knee: Self-Stretch to increase Flexion Right   Knee: Self-Stretch Limitations 15x15 second holds  Gastroc Stretch Both;3 reps;30 seconds   Gastroc Stretch Limitations slantboard      Knee/Hip Exercises: Aerobic   Stationary Bike rocking and full revolutions  seat 19 x10 minutes EOS   not included in billing      Knee/Hip Exercises: Standing   Terminal Knee Extension Limitations x15, 3 second holds R LE   in HS stretch position on 12 inch box      Manual Therapy   Manual Therapy Joint mobilization;Soft tissue mobilization   Manual therapy comments performed separately from all otehr skilled services    Joint Mobilization tib on femur flexion mobilization grade III, patella mobilization all directions; fibular head mobs grade  III     Soft tissue mobilization --             Balance Exercises - 07/22/17 1016      Balance Exercises: Standing   Tandem Stance Eyes open;Foam/compliant surface;3 reps;15 secs  dynadiscs    SLS Eyes open;Foam/compliant surface;3 reps;15 secs  dynadisc   Standing, One Foot on a Step Eyes open;Foam/compliant surface;6 inch;15 secs  dynadisc            PT Education - 07/22/17 1028    Education provided No          PT Short Term Goals - 07/13/17 1010      PT SHORT TERM GOAL #1   Title Patient to demonstrate R knee ROM as being 0-115 degrees in order to improve mechanics and assist in return to PLOF    Baseline 7/23- at best 0 extension and 103 flexion but today 3-90 possibly due to increased edema    Time 3   Period Weeks   Status On-going     PT SHORT TERM GOAL #2   Title Patient to be ambulating with no assistive device, consistent heel toe pattern, no unsteadiness to improve mobiilty and assist in return to PLOF    Baseline 7/23- achieved    Time 3   Period Weeks   Status Achieved     PT SHORT TERM GOAL #3   Title patient to be independent in correct performance of scar mobilization, patella mobility, and edema control in order to improve self-efficacy in managing condition    Baseline 7/23- has been controlling edema I, discussed patella and scar mobility today    Time 3   Period Weeks   Status Partially Met     PT SHORT TERM GOAL #4   Title Patient to be compliant with correct performance of HEP, to be updated PRN    Baseline 7/23- compliant    Time 1   Period Weeks   Status Achieved           PT Long Term Goals - 07/13/17 1014      PT LONG TERM GOAL #1   Title Patient to demonstrate functional strength as being 5/5 in all tested muscle groups in order to improve giat and balance    Baseline 7/23- hip extensors remain weak    Time 6   Period Weeks   Status Partially Met     PT LONG TERM GOAL #2   Title Patient to be able to complete TUG  test in 10 seconds with no AD in order to show improved balance/reduced fall risk    Baseline 7/23- 9.9 no device    Time 6   Period Weeks   Status Achieved     PT LONG TERM GOAL #3   Title Patient to be  able to perform mock-bowling exercise in clinic with no LOB and good form in order to ensure safety and success with individual attempt to return to bowling based tasks    Baseline 7/23- not attempted yet    Time 6   Period Weeks   Status On-going     PT LONG TERM GOAL #4   Title Patient to be able to ascend/descent full flight of stairs with U railing, no AD, reciprocal pattern, good eccentric control in order to show improved community and home access    Baseline 7/23- limited by R knee stiffness but able to perform reciprocal pattern    Time 6   Period Weeks   Status On-going               Plan - 07/22/17 1028    Clinical Impression Statement Continued extensive manual to R knee to continue to promote gains in R knee flexion; otherwise continued with general knee mobility and balance activities today per POC. Patient appears to continue to do generally well with PT, though be it with ongoing slow progress towards knee flexion ROM goals.    Rehab Potential Good   Clinical Impairments Affecting Rehab Potential (+) motivated to participate in PT/return to PLOF, minimal PMH   PT Frequency 3x / week   PT Duration 3 weeks   PT Treatment/Interventions ADLs/Self Care Home Management;Biofeedback;Cryotherapy;DME Instruction;Gait training;Stair training;Functional mobility training;Therapeutic activities;Therapeutic exercise;Balance training;Neuromuscular re-education;Patient/family education;Manual techniques;Scar mobilization;Passive range of motion;Dry needling   PT Next Visit Plan continue aggressive ROM work for knee flexion, aggressive manual; focus on knee extension and hip extension strength. Progress CKC exercise when ROM is consistently at least 110 flexion    PT Home Exercise  Plan Eval: quad sets, heel slides, lateral weight shfits, seated knee flexion stretch; 7/18 prone quad stretch    Consulted and Agree with Plan of Care Patient      Patient will benefit from skilled therapeutic intervention in order to improve the following deficits and impairments:  Abnormal gait, Decreased skin integrity, Pain, Decreased mobility, Decreased scar mobility, Decreased activity tolerance, Decreased range of motion, Decreased strength, Hypomobility, Decreased balance, Difficulty walking, Increased edema, Impaired flexibility  Visit Diagnosis: Stiffness of right knee, not elsewhere classified  Localized edema  Difficulty in walking, not elsewhere classified  Unsteadiness on feet     Problem List Patient Active Problem List   Diagnosis Date Noted  . Pulmonary nodule 07/19/2017  . Acute deep vein thrombosis (DVT) of right lower extremity ([place])   . Heme + stool 07/15/2017  . Constipation 07/15/2017  . Melena 07/15/2017  . Acute blood loss anemia 07/15/2017  . Postoperative anemia 06/25/2017  . Pulmonary embolism ([place]) 06/18/2017  . Primary localized osteoarthritis of right knee 06/10/2017  . Upper airway cough syndrome 08/29/2015  . Prostate cancer ([place]) 08/17/2015  . Exposure to asbestos 08/17/2015  . Personal history of colonic polyps 08/08/2014  . Joint pain 06/05/2014  . Vitamin D deficiency 06/05/2014  . BPH (benign prostatic hypertrophy) 06/01/2013  . Mild hyperlipidemia 06/01/2013  . Routine general medical examination at a health care facility 06/01/2013    Deniece Ree PT, DPT [facility] [address] [place], [place], [address] Phone: [phone]   Fax:  [phone]  Name: Luis Stewart MRN: [id number] Date of Birth: 1951-12-02

## 2017-07-24 ENCOUNTER — Ambulatory Visit (INDEPENDENT_AMBULATORY_CARE_PROVIDER_SITE_OTHER): Payer: Medicare Other | Admitting: Family Medicine

## 2017-07-24 ENCOUNTER — Telehealth: Payer: Self-pay | Admitting: Family Medicine

## 2017-07-24 ENCOUNTER — Ambulatory Visit (HOSPITAL_COMMUNITY): Payer: Medicare Other

## 2017-07-24 ENCOUNTER — Encounter: Payer: Self-pay | Admitting: Family Medicine

## 2017-07-24 VITALS — BP 106/70 | HR 77 | Temp 98.4°F | Resp 18 | Wt 173.0 lb

## 2017-07-24 DIAGNOSIS — I2699 Other pulmonary embolism without acute cor pulmonale: Secondary | ICD-10-CM | POA: Diagnosis not present

## 2017-07-24 DIAGNOSIS — M25661 Stiffness of right knee, not elsewhere classified: Secondary | ICD-10-CM

## 2017-07-24 DIAGNOSIS — D649 Anemia, unspecified: Secondary | ICD-10-CM | POA: Diagnosis not present

## 2017-07-24 DIAGNOSIS — R262 Difficulty in walking, not elsewhere classified: Secondary | ICD-10-CM | POA: Diagnosis not present

## 2017-07-24 DIAGNOSIS — R2681 Unsteadiness on feet: Secondary | ICD-10-CM | POA: Diagnosis not present

## 2017-07-24 DIAGNOSIS — R600 Localized edema: Secondary | ICD-10-CM

## 2017-07-24 LAB — CBC
HEMATOCRIT: 31.6 % — AB (ref 38.5–50.0)
Hemoglobin: 10.4 g/dL — ABNORMAL LOW (ref 13.0–17.0)
MCH: 31 pg (ref 27.0–33.0)
MCHC: 32.9 g/dL (ref 32.0–36.0)
MCV: 94.3 fL (ref 80.0–100.0)
PLATELETS: 239 10*3/uL (ref 140–400)
RBC: 3.35 MIL/uL — ABNORMAL LOW (ref 4.20–5.80)
RDW: 16.7 % — AB (ref 11.0–15.0)
WBC: 4.2 10*3/uL (ref 3.8–10.8)

## 2017-07-24 NOTE — Patient Instructions (Addendum)
Move Sept Appt to last week in September  Continue blood thinner Continue protonix

## 2017-07-24 NOTE — Telephone Encounter (Signed)
Pt was told to continue to wear ted hose, wants to know if he needs a prescription for them or where he can just go and buy them.

## 2017-07-24 NOTE — Progress Notes (Signed)
   Subjective:    Patient ID: Luis Stewart, male    DOB: 1951/04/08, 66 y.o.   MRN: [id number]  Patient presents for Hospitalization Follow-up   Pt here for hospital followup, secondary to GI bleed on Xarelto. He was sent to GI who admitted him for EGD/Colonoscopy, no source of bleeding found , capsule endoscopy Hb was 8.4 at discharge, given 1 unit packed red blood cells Now on eliquis 5mg  twice a day and protonix 40mg  twice a day  No hematuria, no current change in stools or blood in stools  Has been to PT three times this week and feels okay Not sleeping as well   Had follow with orthopedics this week. Told Knee improved, continue PT Not requiringpain medication   Reviewed ER and admission notes/labs/imaging in detail with pt and wife  Review Of Systems:  GEN- + fatigue,denies  fever, weight loss,weakness, recent illness HEENT- denies eye drainage, change in vision, nasal discharge, CVS- denies chest pain, palpitations RESP- denies SOB, cough, wheeze ABD- denies N/V, change in stools, abd pain GU- denies dysuria, hematuria, dribbling, incontinence MSK- denies joint pain, muscle aches, injury Neuro- denies headache, dizziness, syncope, seizure activity       Objective:    BP 106/70   Pulse 77   Temp 98.4 F (36.9 C)   Resp 18   Wt 173 lb (78.5 kg)   SpO2 97%   BMI 22.21 kg/m  GEN- NAD, alert and oriented x3 HEENT- PERRL, EOMI, non injected sclera, pink conjunctiva, MMM, oropharynx clear CVS- RRR, no murmur RESP-CTAB ABD-NABS,soft,NT,ND EXT- No edema Pulses- Radial  2+        Assessment & Plan:     Note pulmonary nodule is not new and already being followed by pulmonary, stable from 2016  Problem List Items Addressed This Visit    Pulmonary embolism (H[facility])   Postoperative anemia - Primary    Hb in office 10.4 which is exellent after tranfusion and no active bleed Continue Eliquis 5mg  BID until Sept 28th Continue Ted hose on RLE      Relevant  Orders   CBC (Completed)      Note: This dictation was prepared with Dragon dictation along with smaller phrase technology. Any transcriptional errors that result from this process are unintentional.

## 2017-07-24 NOTE — Therapy (Signed)
[place], [place], [address] Phone: [phone]   Fax:  [phone]  Physical Therapy Treatment  Patient Details  Name: Luis Stewart MRN: [id number] Date of Birth: 04/11/1951 Referring Provider: Kathryne Hitch  Encounter Date: 07/24/2017      PT End of Session - 07/24/17 0951    Visit Number 12   Number of Visits 19   Date for PT Re-Evaluation 08/03/17   Authorization Type Medicare primary, BCBS supplementary (G-codes done 9th visit)   Authorization Time Period 06/22/17 to 08/03/17   Authorization - Visit Number 12   Authorization - Number of Visits 19   PT Start Time 0947   PT Stop Time 1030   PT Time Calculation (min) 43 min   Activity Tolerance Patient tolerated treatment well;No increased pain   Behavior During Therapy WFL for tasks assessed/performed      Past Medical History:  Diagnosis Date  . Arthritis   . Asbestosis ([place])   . Cyst of kidney, acquired   . Headache   . History of fracture of leg    Right, childhood  . Hx of migraine headaches   . Hyperlipidemia   . Neuropathy   . PE (pulmonary thromboembolism) ([place])    After knee surgery  . Prostate cancer () 2015   Adenocarcinoma by biopsy    Past Surgical History:  Procedure Laterality Date  . BIOPSY PROSTATE  2003 and 2005  . COLONOSCOPY  11/28/2008   Dr. Rourk:internal hemorrhoids/tortus colon/ascending colon polyps, tubular adenomas  . COLONOSCOPY N/A 09/04/2014   Procedure: COLONOSCOPY;  Surgeon: Daneil Dolin, MD;  [facility];  Service: Endoscopy;  Laterality: N/A;  9:30  . COLONOSCOPY N/A 07/17/2017   Procedure: COLONOSCOPY;  Surgeon: Danie Binder, MD;  [facility];  Service: Endoscopy;  Laterality: N/A;  . ESOPHAGOGASTRODUODENOSCOPY N/A 07/16/2017   Procedure: ESOPHAGOGASTRODUODENOSCOPY (EGD);  Surgeon: Danie Binder, MD;  [facility];  Service: Endoscopy;  Laterality: N/A;  .  GIVENS CAPSULE STUDY  07/16/2017   Procedure: GIVENS CAPSULE STUDY;  Surgeon: Danie Binder, MD;  [facility];  Service: Endoscopy;;  . POLYPECTOMY  07/17/2017   Procedure: POLYPECTOMY;  Surgeon: Danie Binder, MD;  [facility];  Service: Endoscopy;;  cecal  . TOTAL KNEE ARTHROPLASTY Right 06/10/2017  . TOTAL KNEE ARTHROPLASTY Right 06/10/2017   Procedure: TOTAL KNEE ARTHROPLASTY;  Surgeon: [name], MD;  Location: [facility];  Service: Orthopedics;  Laterality: Right;    There were no vitals filed for this visit.      Subjective Assessment - 07/24/17 0950    Subjective Pt reports knee is feeling okay today, no reports of pain just a little stiffness with the rain.     Pertinent History xarelto, history of PE, history of prostate cancer (just monitoring now), neuropathy    Patient Stated Goals get back to normal ASAP    Currently in Pain? No/denies                         [facility] Adult PT Treatment/Exercise - 07/24/17 0001      Knee/Hip Exercises: Stretches   Quad Stretch Right;3 reps;30 seconds   Quad Stretch Limitations prone    Knee: Self-Stretch to increase Flexion Right   Knee: Self-Stretch Limitations Knee drives on 11XB step 26O 10" holds   Press photographer Both;3 reps;30 seconds   Press photographer  Limitations slantboard      Knee/Hip Exercises: Aerobic   Stationary Bike full revolution seat 18 x 5 min     Knee/Hip Exercises: Standing   Forward Lunges 10 reps   Forward Lunges Limitations 4in step no HHA   Wall Squat 10 reps;3 seconds     Knee/Hip Exercises: Seated   Sit to Sand 10 reps;without UE support  Rt foot behind lowest mat height     Knee/Hip Exercises: Supine   Knee Flexion Limitations 105 prior 109 following manual     Knee/Hip Exercises: Prone   Hip Extension 15 reps   Contract/Relax to Increase Flexion 5 reps     Manual Therapy   Manual Therapy Joint mobilization   Manual therapy comments performed  separately from all otehr skilled services    Joint Mobilization patella mobs; tib on femur flexion mobilization grade III, patella mobilization all directions; fibular head mobs grade III                    PT Short Term Goals - 07/13/17 1010      PT SHORT TERM GOAL #1   Title Patient to demonstrate R knee ROM as being 0-115 degrees in order to improve mechanics and assist in return to PLOF    Baseline 7/23- at best 0 extension and 103 flexion but today 3-90 possibly due to increased edema    Time 3   Period Weeks   Status On-going     PT SHORT TERM GOAL #2   Title Patient to be ambulating with no assistive device, consistent heel toe pattern, no unsteadiness to improve mobiilty and assist in return to PLOF    Baseline 7/23- achieved    Time 3   Period Weeks   Status Achieved     PT SHORT TERM GOAL #3   Title patient to be independent in correct performance of scar mobilization, patella mobility, and edema control in order to improve self-efficacy in managing condition    Baseline 7/23- has been controlling edema I, discussed patella and scar mobility today    Time 3   Period Weeks   Status Partially Met     PT SHORT TERM GOAL #4   Title Patient to be compliant with correct performance of HEP, to be updated PRN    Baseline 7/23- compliant    Time 1   Period Weeks   Status Achieved           PT Long Term Goals - 07/13/17 1014      PT LONG TERM GOAL #1   Title Patient to demonstrate functional strength as being 5/5 in all tested muscle groups in order to improve giat and balance    Baseline 7/23- hip extensors remain weak    Time 6   Period Weeks   Status Partially Met     PT LONG TERM GOAL #2   Title Patient to be able to complete TUG test in 10 seconds with no AD in order to show improved balance/reduced fall risk    Baseline 7/23- 9.9 no device    Time 6   Period Weeks   Status Achieved     PT LONG TERM GOAL #3   Title Patient to be able to perform  mock-bowling exercise in clinic with no LOB and good form in order to ensure safety and success with individual attempt to return to bowling based tasks    Baseline 7/23- not attempted yet    Time  6   Period Weeks   Status On-going     PT LONG TERM GOAL #4   Title Patient to be able to ascend/descent full flight of stairs with U railing, no AD, reciprocal pattern, good eccentric control in order to show improved community and home access    Baseline 7/23- limited by R knee stiffness but able to perform reciprocal pattern    Time 6   Period Weeks   Status On-going               Plan - 07/24/17 1019    Clinical Impression Statement Added contract relax for flexion with improved AROM from 105 to 109 following.   Session focus on knee mobility with manual and therex and gluteal strengthening with additional CKC exercises.  Pt able to complete all therex with good form following initial cueing with no reports of pain.     Rehab Potential Good   Clinical Impairments Affecting Rehab Potential (+) motivated to participate in PT/return to PLOF, minimal PMH   PT Frequency 3x / week   PT Duration 3 weeks   PT Treatment/Interventions ADLs/Self Care Home Management;Biofeedback;Cryotherapy;DME Instruction;Gait training;Stair training;Functional mobility training;Therapeutic activities;Therapeutic exercise;Balance training;Neuromuscular re-education;Patient/family education;Manual techniques;Scar mobilization;Passive range of motion;Dry needling   PT Next Visit Plan continue aggressive ROM work for knee flexion, aggressive manual; focus on knee extension and hip extension strength. Progress CKC exercise when ROM is consistently at least 110 flexion    PT Home Exercise Plan Eval: quad sets, heel slides, lateral weight shfits, seated knee flexion stretch; 7/18 prone quad stretch       Patient will benefit from skilled therapeutic intervention in order to improve the following deficits and  impairments:  Abnormal gait, Decreased skin integrity, Pain, Decreased mobility, Decreased scar mobility, Decreased activity tolerance, Decreased range of motion, Decreased strength, Hypomobility, Decreased balance, Difficulty walking, Increased edema, Impaired flexibility  Visit Diagnosis: Stiffness of right knee, not elsewhere classified  Localized edema  Difficulty in walking, not elsewhere classified  Unsteadiness on feet     Problem List Patient Active Problem List   Diagnosis Date Noted  . Pulmonary nodule 07/19/2017  . Acute deep vein thrombosis (DVT) of right lower extremity ([place])   . Heme + stool 07/15/2017  . Constipation 07/15/2017  . Melena 07/15/2017  . Acute blood loss anemia 07/15/2017  . Postoperative anemia 06/25/2017  . Pulmonary embolism ([place]) 06/18/2017  . Primary localized osteoarthritis of right knee 06/10/2017  . Upper airway cough syndrome 08/29/2015  . Prostate cancer ([place]) 08/17/2015  . Exposure to asbestos 08/17/2015  . Personal history of colonic polyps 08/08/2014  . Joint pain 06/05/2014  . Vitamin D deficiency 06/05/2014  . BPH (benign prostatic hypertrophy) 06/01/2013  . Mild hyperlipidemia 06/01/2013  . Routine general medical examination at a health care facility 06/01/2013   Luis Stewart, [place]; [place]  Aldona Lento 07/24/2017, 10:31 AM  [facility] [address] [place], [place], [address] Phone: [phone]   Fax:  [phone]  Name: Luis Stewart MRN: [id number] Date of Birth: 04/24/1951

## 2017-07-26 ENCOUNTER — Encounter: Payer: Self-pay | Admitting: Family Medicine

## 2017-07-26 NOTE — Assessment & Plan Note (Signed)
Hb in office 10.4 which is exellent after tranfusion and no active bleed Continue Eliquis 5mg  BID until Sept 28th Continue Ted hose on RLE

## 2017-07-27 ENCOUNTER — Ambulatory Visit (HOSPITAL_COMMUNITY): Payer: Medicare Other | Admitting: Physical Therapy

## 2017-07-27 DIAGNOSIS — M25661 Stiffness of right knee, not elsewhere classified: Secondary | ICD-10-CM | POA: Diagnosis not present

## 2017-07-27 DIAGNOSIS — R262 Difficulty in walking, not elsewhere classified: Secondary | ICD-10-CM

## 2017-07-27 DIAGNOSIS — R2681 Unsteadiness on feet: Secondary | ICD-10-CM

## 2017-07-27 DIAGNOSIS — R600 Localized edema: Secondary | ICD-10-CM

## 2017-07-27 NOTE — Telephone Encounter (Signed)
He just needs the Ted hose, you can give a script DX- DVT/PE

## 2017-07-27 NOTE — Therapy (Signed)
[facility] [place], [place], [address] Phone: [phone]   Fax:  [phone]  Physical Therapy Treatment  Patient Details  Name: Luis Stewart MRN: [id number] Date of Birth: [birth year] Referring Provider: Kathryne Hitch  Encounter Date: 07/27/2017      PT End of Session - 07/27/17 1027    Visit Number 13   Number of Visits 19   Date for PT Re-Evaluation 08/03/17   Authorization Type Medicare primary, BCBS supplementary (G-codes done 9th visit)   Authorization Time Period 06/22/17 to 08/03/17   Authorization - Visit Number 13   Authorization - Number of Visits 19   PT Start Time 0948   PT Stop Time 1027   PT Time Calculation (min) 39 min   Activity Tolerance Patient tolerated treatment well   Behavior During Therapy [facility] for tasks assessed/performed      Past Medical History:  Diagnosis Date  . Arthritis   . Asbestosis ([place])   . Cyst of kidney, acquired   . Headache   . History of fracture of leg    Right, childhood  . Hx of migraine headaches   . Hyperlipidemia   . Neuropathy   . PE (pulmonary thromboembolism) ([place])    After knee surgery  . Prostate cancer ([place]) 2015   Adenocarcinoma by biopsy    Past Surgical History:  Procedure Laterality Date  . BIOPSY PROSTATE  2003 and 2005  . COLONOSCOPY  11/28/2008   Dr. Rourk:internal hemorrhoids/tortus colon/ascending colon polyps, tubular adenomas  . COLONOSCOPY N/A 09/04/2014   Procedure: COLONOSCOPY;  Surgeon: Daneil Dolin, MD;  [facility];  Service: Endoscopy;  Laterality: N/A;  9:30  . COLONOSCOPY N/A 07/17/2017   Procedure: COLONOSCOPY;  Surgeon: Danie Binder, MD;  [facility];  Service: Endoscopy;  Laterality: N/A;  . ESOPHAGOGASTRODUODENOSCOPY N/A 07/16/2017   Procedure: ESOPHAGOGASTRODUODENOSCOPY (EGD);  Surgeon: Danie Binder, MD;  [facility];  Service: Endoscopy;  Laterality: N/A;  . GIVENS CAPSULE STUDY   07/16/2017   Procedure: GIVENS CAPSULE STUDY;  Surgeon: Danie Binder, MD;  [facility];  Service: Endoscopy;;  . POLYPECTOMY  07/17/2017   Procedure: POLYPECTOMY;  Surgeon: Danie Binder, MD;  [facility];  Service: Endoscopy;;  cecal  . TOTAL KNEE ARTHROPLASTY Right 06/10/2017  . TOTAL KNEE ARTHROPLASTY Right 06/10/2017   Procedure: TOTAL KNEE ARTHROPLASTY;  Surgeon: [name], MD;  Location: [place];  Service: Orthopedics;  Laterality: Right;    There were no vitals filed for this visit.      Subjective Assessment - 07/27/17 0950    Subjective Patient arrives stating that he did a lot saturday including mowing the lawn for the first time; he got to 109 last sessoin    Pertinent History xarelto, history of PE, history of prostate cancer (just monitoring now), neuropathy    Patient Stated Goals get back to normal ASAP    Currently in Pain? Yes   Pain Score 2    Pain Location Knee   Pain Orientation Right   Pain Descriptors / Indicators Aching;Sore   Pain Type Surgical pain;Acute pain   Pain Radiating Towards none    Pain Onset More than a month ago   Pain Frequency Constant   Aggravating Factors  none    Pain Relieving Factors ice and elevation    Effect of Pain on Daily Activities modeate impact  [facility] Adult PT Treatment/Exercise - 07/27/17 0001      Knee/Hip Exercises: Stretches   Active Hamstring Stretch --   Active Hamstring Stretch Limitations --   Knee: Self-Stretch to increase Flexion Right   Knee: Self-Stretch Limitations Knee drives on 32KG step 25K 15" holds   Gastroc Stretch Both;3 reps;30 seconds   Gastroc Stretch Limitations slantboard      Knee/Hip Exercises: Aerobic   Stationary Bike rocking and full revolutations seat 18 progressing to 17 x10 min at EOS   not included in billing      Knee/Hip Exercises: Standing   Heel Raises Both;1 set;20 reps   Heel Raises Limitations heel and toe     Forward Lunges 10 reps   Forward Lunges Limitations 4in step no HHA   Lateral Step Up Both;1 set;10 reps   Lateral Step Up Limitations 4 inch box no HHA    Forward Step Up Both;1 set;10 reps   Forward Step Up Limitations 4 inch box no HHA    Other Standing Knee Exercises eccentric stand to sits 1x15      Manual Therapy   Manual Therapy Joint mobilization   Manual therapy comments performed separately from all otehr skilled services    Edema Management Retro massage with LE elevated    Joint Mobilization patella mobs; tib on femur flexion mobilization grade III, patella mobilization all directions; fibular head mobs grade III                  PT Education - 07/27/17 1027    Education provided No          PT Short Term Goals - 07/13/17 1010      PT SHORT TERM GOAL #1   Title Patient to demonstrate R knee ROM as being 0-115 degrees in order to improve mechanics and assist in return to PLOF    Baseline 7/23- at best 0 extension and 103 flexion but today 3-90 possibly due to increased edema    Time 3   Period Weeks   Status On-going     PT SHORT TERM GOAL #2   Title Patient to be ambulating with no assistive device, consistent heel toe pattern, no unsteadiness to improve mobiilty and assist in return to PLOF    Baseline 7/23- achieved    Time 3   Period Weeks   Status Achieved     PT SHORT TERM GOAL #3   Title patient to be independent in correct performance of scar mobilization, patella mobility, and edema control in order to improve self-efficacy in managing condition    Baseline 7/23- has been controlling edema I, discussed patella and scar mobility today    Time 3   Period Weeks   Status Partially Met     PT SHORT TERM GOAL #4   Title Patient to be compliant with correct performance of HEP, to be updated PRN    Baseline 7/23- compliant    Time 1   Period Weeks   Status Achieved           PT Long Term Goals - 07/13/17 1014      PT LONG TERM GOAL #1    Title Patient to demonstrate functional strength as being 5/5 in all tested muscle groups in order to improve giat and balance    Baseline 7/23- hip extensors remain weak    Time 6   Period Weeks   Status Partially Met     PT LONG TERM GOAL #2  Title Patient to be able to complete TUG test in 10 seconds with no AD in order to show improved balance/reduced fall risk    Baseline 7/23- 9.9 no device    Time 6   Period Weeks   Status Achieved     PT LONG TERM GOAL #3   Title Patient to be able to perform mock-bowling exercise in clinic with no LOB and good form in order to ensure safety and success with individual attempt to return to bowling based tasks    Baseline 7/23- not attempted yet    Time 6   Period Weeks   Status On-going     PT LONG TERM GOAL #4   Title Patient to be able to ascend/descent full flight of stairs with U railing, no AD, reciprocal pattern, good eccentric control in order to show improved community and home access    Baseline 7/23- limited by R knee stiffness but able to perform reciprocal pattern    Time 6   Period Weeks   Status On-going               Plan - 07/27/17 1027    Clinical Impression Statement Continued with manual for knee flexion ROM and edema today; also continued with knee ROM and mobility based exercises, followed by balance and strength drills. Patient continues to make slow progress with skilled PT services, plan to continue to monitor for consistently reaching target goal of at least 110 degrees flexion moving forward.    Rehab Potential Good   Clinical Impairments Affecting Rehab Potential (+) motivated to participate in PT/return to PLOF, minimal PMH   PT Frequency 3x / week   PT Duration 3 weeks   PT Treatment/Interventions ADLs/Self Care Home Management;Biofeedback;Cryotherapy;DME Instruction;Gait training;Stair training;Functional mobility training;Therapeutic activities;Therapeutic exercise;Balance training;Neuromuscular  re-education;Patient/family education;Manual techniques;Scar mobilization;Passive range of motion;Dry needling   PT Next Visit Plan continue aggressive ROM work for knee flexion, aggressive manual; focus on knee extension and hip extension strength. Progress CKC exercise when ROM is consistently at least 110 flexion    PT Home Exercise Plan Eval: quad sets, heel slides, lateral weight shfits, seated knee flexion stretch; 7/18 prone quad stretch    Consulted and Agree with Plan of Care Patient      Patient will benefit from skilled therapeutic intervention in order to improve the following deficits and impairments:  Abnormal gait, Decreased skin integrity, Pain, Decreased mobility, Decreased scar mobility, Decreased activity tolerance, Decreased range of motion, Decreased strength, Hypomobility, Decreased balance, Difficulty walking, Increased edema, Impaired flexibility  Visit Diagnosis: Stiffness of right knee, not elsewhere classified  Localized edema  Difficulty in walking, not elsewhere classified  Unsteadiness on feet     Problem List Patient Active Problem List   Diagnosis Date Noted  . Pulmonary nodule 07/19/2017  . Acute deep vein thrombosis (DVT) of right lower extremity ([place])   . Heme + stool 07/15/2017  . Constipation 07/15/2017  . Melena 07/15/2017  . Acute blood loss anemia 07/15/2017  . Postoperative anemia 06/25/2017  . Pulmonary embolism ([place]) 06/18/2017  . Primary localized osteoarthritis of right knee 06/10/2017  . Upper airway cough syndrome 08/29/2015  . Prostate cancer ([place]) 08/17/2015  . Exposure to asbestos 08/17/2015  . Personal history of colonic polyps 08/08/2014  . Joint pain 06/05/2014  . Vitamin D deficiency 06/05/2014  . BPH (benign prostatic hypertrophy) 06/01/2013  . Mild hyperlipidemia 06/01/2013  . Routine general medical examination at a health care facility 06/01/2013   Deniece Ree  PT, DPT [facility] [address] [place], [place], [address] Phone: [phone]   Fax:  [phone]  Name: Luis Stewart MRN: [id number] Date of Birth: April 16, 1951

## 2017-07-27 NOTE — Telephone Encounter (Signed)
Patient will need prescription with compression strength.   Ok to order?

## 2017-07-27 NOTE — Telephone Encounter (Signed)
Call placed to patient and patient made aware.  

## 2017-07-29 ENCOUNTER — Ambulatory Visit (HOSPITAL_COMMUNITY): Payer: Medicare Other | Admitting: Physical Therapy

## 2017-07-29 DIAGNOSIS — R2681 Unsteadiness on feet: Secondary | ICD-10-CM | POA: Diagnosis not present

## 2017-07-29 DIAGNOSIS — M25661 Stiffness of right knee, not elsewhere classified: Secondary | ICD-10-CM

## 2017-07-29 DIAGNOSIS — R600 Localized edema: Secondary | ICD-10-CM | POA: Diagnosis not present

## 2017-07-29 DIAGNOSIS — R262 Difficulty in walking, not elsewhere classified: Secondary | ICD-10-CM | POA: Diagnosis not present

## 2017-07-29 NOTE — Patient Instructions (Signed)
   Prone Quad Stretch with Strap  Place strap/belt around foot. Lay on stomach and hold strap over opposite should as to the leg. Pull strap bending knee until stretch is felt in front of leg.  You may need to get your wife to help you.  Hold for at least 30 seconds, 3 repetitions with your right leg, at least 2 times a day.

## 2017-07-29 NOTE — Therapy (Signed)
[place], [place], [address] Phone: [phone]   Fax:  [phone]  Physical Therapy Treatment  Patient Details  Name: Luis Stewart MRN: [id number] Date of Birth: July 14, 1951 Referring Provider: Kathryne Hitch  Encounter Date: 07/29/2017      PT End of Session - 07/29/17 1026    Visit Number 14   Number of Visits 19   Date for PT Re-Evaluation 08/03/17   Authorization Type Medicare primary, BCBS supplementary (G-codes done 9th visit)   Authorization Time Period 06/22/17 to 08/03/17   Authorization - Visit Number 20   Authorization - Number of Visits 19   PT Start Time 0946   PT Stop Time 1025   PT Time Calculation (min) 39 min   Activity Tolerance Patient tolerated treatment well   Behavior During Therapy [facility] for tasks assessed/performed      Past Medical History:  Diagnosis Date  . Arthritis   . Asbestosis ([place])   . Cyst of kidney, acquired   . Headache   . History of fracture of leg    Right, childhood  . Hx of migraine headaches   . Hyperlipidemia   . Neuropathy   . PE (pulmonary thromboembolism) ([place])    After knee surgery  . Prostate cancer ([place]) 2015   Adenocarcinoma by biopsy    Past Surgical History:  Procedure Laterality Date  . BIOPSY PROSTATE  2003 and 2005  . COLONOSCOPY  11/28/2008   Dr. Rourk:internal hemorrhoids/tortus colon/ascending colon polyps, tubular adenomas  . COLONOSCOPY N/A 09/04/2014   Procedure: COLONOSCOPY;  Surgeon: Daneil Dolin, MD;  [facility];  Service: Endoscopy;  Laterality: N/A;  9:30  . COLONOSCOPY N/A 07/17/2017   Procedure: COLONOSCOPY;  Surgeon: Danie Binder, MD;  [facility];  Service: Endoscopy;  Laterality: N/A;  . ESOPHAGOGASTRODUODENOSCOPY N/A 07/16/2017   Procedure: ESOPHAGOGASTRODUODENOSCOPY (EGD);  Surgeon: Danie Binder, MD;  [facility];  Service: Endoscopy;  Laterality: N/A;  . GIVENS CAPSULE STUDY   07/16/2017   Procedure: GIVENS CAPSULE STUDY;  Surgeon: Danie Binder, MD;  [facility];  Service: Endoscopy;;  . POLYPECTOMY  07/17/2017   Procedure: POLYPECTOMY;  Surgeon: Danie Binder, MD;  [facility];  Service: Endoscopy;;  cecal  . TOTAL KNEE ARTHROPLASTY Right 06/10/2017  . TOTAL KNEE ARTHROPLASTY Right 06/10/2017   Procedure: TOTAL KNEE ARTHROPLASTY;  Surgeon: [name], MD;  Location: [place];  Service: Orthopedics;  Laterality: Right;    There were no vitals filed for this visit.      Subjective Assessment - 07/29/17 0949    Subjective Patient arrives stating he felt worked after last time but is doing well, no major changes since last session    Pertinent History xarelto, history of PE, history of prostate cancer (just monitoring now), neuropathy    Patient Stated Goals get back to normal ASAP    Currently in Pain? Yes   Pain Score 1    Pain Location Knee   Pain Orientation Right            [facility] PT Assessment - 07/29/17 0001      AROM   Right Knee Flexion 110  after manual and knee mobilty exercises                     [facility] Adult PT Treatment/Exercise - 07/29/17 0001      Knee/Hip Exercises: Stretches  Quad Stretch Right;3 reps;30 seconds   Quad Stretch Limitations prone    Knee: Self-Stretch to increase Flexion Right   Knee: Self-Stretch Limitations Knee drives on stairs 62I 15" holds     Knee/Hip Exercises: Aerobic   Stationary Bike rocking and full revolutations seat 17  x10 min at EOS   not included in billing      Knee/Hip Exercises: Standing   Lateral Step Up Both;1 set;15 reps   Lateral Step Up Limitations 4 inch step no UEs    Forward Step Up Both;1 set;15 reps   Forward Step Up Limitations 4 inch step no UEs    Step Down Both;1 set;10 reps   Step Down Limitations 4 inch step U HHA    Other Standing Knee Exercises eccentric stand to sits 1x20     Manual Therapy   Manual Therapy Joint  mobilization;Edema management   Manual therapy comments performed separately from all otehr skilled services    Edema Management Retro massage with LE elevated    Joint Mobilization patella mobs; tib on femur flexion mobilization grade III, patella mobilization all directions; fibular head mobs grade III                  PT Education - 07/29/17 1026    Education provided Yes   Education Details quad stretch HEP, emphasis on this exercise   Person(s) Educated Patient   Methods Explanation   Comprehension Verbalized understanding          PT Short Term Goals - 07/13/17 1010      PT SHORT TERM GOAL #1   Title Patient to demonstrate R knee ROM as being 0-115 degrees in order to improve mechanics and assist in return to PLOF    Baseline 7/23- at best 0 extension and 103 flexion but today 3-90 possibly due to increased edema    Time 3   Period Weeks   Status On-going     PT SHORT TERM GOAL #2   Title Patient to be ambulating with no assistive device, consistent heel toe pattern, no unsteadiness to improve mobiilty and assist in return to PLOF    Baseline 7/23- achieved    Time 3   Period Weeks   Status Achieved     PT SHORT TERM GOAL #3   Title patient to be independent in correct performance of scar mobilization, patella mobility, and edema control in order to improve self-efficacy in managing condition    Baseline 7/23- has been controlling edema I, discussed patella and scar mobility today    Time 3   Period Weeks   Status Partially Met     PT SHORT TERM GOAL #4   Title Patient to be compliant with correct performance of HEP, to be updated PRN    Baseline 7/23- compliant    Time 1   Period Weeks   Status Achieved           PT Long Term Goals - 07/13/17 1014      PT LONG TERM GOAL #1   Title Patient to demonstrate functional strength as being 5/5 in all tested muscle groups in order to improve giat and balance    Baseline 7/23- hip extensors remain weak     Time 6   Period Weeks   Status Partially Met     PT LONG TERM GOAL #2   Title Patient to be able to complete TUG test in 10 seconds with no AD in order to show improved  balance/reduced fall risk    Baseline 7/23- 9.9 no device    Time 6   Period Weeks   Status Achieved     PT LONG TERM GOAL #3   Title Patient to be able to perform mock-bowling exercise in clinic with no LOB and good form in order to ensure safety and success with individual attempt to return to bowling based tasks    Baseline 7/23- not attempted yet    Time 6   Period Weeks   Status On-going     PT LONG TERM GOAL #4   Title Patient to be able to ascend/descent full flight of stairs with U railing, no AD, reciprocal pattern, good eccentric control in order to show improved community and home access    Baseline 7/23- limited by R knee stiffness but able to perform reciprocal pattern    Time 6   Period Weeks   Status On-going               Plan - 07/29/17 1026    Clinical Impression Statement Patient arrives stating he is doing well, no major changes since last session. Continued with extensive manual techniques as well as massage for edema control today, followed by functional stretches for knee flexion ROM and functional strengthening based tasks. Continue to have difficulty in reaching target range for knee flexion at this point.    Rehab Potential Good   Clinical Impairments Affecting Rehab Potential (+) motivated to participate in PT/return to PLOF, minimal PMH   PT Frequency 3x / week   PT Duration 3 weeks   PT Treatment/Interventions ADLs/Self Care Home Management;Biofeedback;Cryotherapy;DME Instruction;Gait training;Stair training;Functional mobility training;Therapeutic activities;Therapeutic exercise;Balance training;Neuromuscular re-education;Patient/family education;Manual techniques;Scar mobilization;Passive range of motion;Dry needling   PT Next Visit Plan continue aggressive manual for knee  flexion. Eccentric strength, hip extension strength. Stair training    PT Home Exercise Plan Eval: quad sets, heel slides, lateral weight shfits, seated knee flexion stretch; 7/18 prone quad stretch    Consulted and Agree with Plan of Care Patient      Patient will benefit from skilled therapeutic intervention in order to improve the following deficits and impairments:  Abnormal gait, Decreased skin integrity, Pain, Decreased mobility, Decreased scar mobility, Decreased activity tolerance, Decreased range of motion, Decreased strength, Hypomobility, Decreased balance, Difficulty walking, Increased edema, Impaired flexibility  Visit Diagnosis: Stiffness of right knee, not elsewhere classified  Localized edema  Difficulty in walking, not elsewhere classified  Unsteadiness on feet     Problem List Patient Active Problem List   Diagnosis Date Noted  . Pulmonary nodule 07/19/2017  . Acute deep vein thrombosis (DVT) of right lower extremity ([place])   . Heme + stool 07/15/2017  . Constipation 07/15/2017  . Melena 07/15/2017  . Acute blood loss anemia 07/15/2017  . Postoperative anemia 06/25/2017  . Pulmonary embolism ([place]) 06/18/2017  . Primary localized osteoarthritis of right knee 06/10/2017  . Upper airway cough syndrome 08/29/2015  . Prostate cancer ([place]) 08/17/2015  . Exposure to asbestos 08/17/2015  . Personal history of colonic polyps 08/08/2014  . Joint pain 06/05/2014  . Vitamin D deficiency 06/05/2014  . BPH (benign prostatic hypertrophy) 06/01/2013  . Mild hyperlipidemia 06/01/2013  . Routine general medical examination at a health care facility 06/01/2013    Deniece Ree PT, DPT [facility] [address] [place], [place], [address] Phone: [phone]   Fax:  [phone]  Name: Luis Stewart MRN: [id number] Date of  Birth: 27-Mar-1951

## 2017-07-31 ENCOUNTER — Ambulatory Visit (HOSPITAL_COMMUNITY): Payer: Medicare Other

## 2017-07-31 DIAGNOSIS — R2681 Unsteadiness on feet: Secondary | ICD-10-CM | POA: Diagnosis not present

## 2017-07-31 DIAGNOSIS — R600 Localized edema: Secondary | ICD-10-CM | POA: Diagnosis not present

## 2017-07-31 DIAGNOSIS — R262 Difficulty in walking, not elsewhere classified: Secondary | ICD-10-CM | POA: Diagnosis not present

## 2017-07-31 DIAGNOSIS — M25661 Stiffness of right knee, not elsewhere classified: Secondary | ICD-10-CM | POA: Diagnosis not present

## 2017-07-31 NOTE — Therapy (Signed)
[place], [place], [address] Phone: [phone]   Fax:  [phone]  Physical Therapy Treatment  Patient Details  Name: Luis Stewart MRN: [id number] Date of Birth: [birth year] Referring Provider: Kathryne Hitch  Encounter Date: 07/31/2017      PT End of Session - 07/31/17 0951    Visit Number 15   Number of Visits 19   Date for PT Re-Evaluation 08/03/17   Authorization Type Medicare primary, BCBS supplementary (G-codes done 9th visit)   Authorization Time Period 06/22/17 to 08/03/17   Authorization - Visit Number 15   Authorization - Number of Visits 19   PT Start Time 0948   PT Stop Time 1029   PT Time Calculation (min) 41 min      Past Medical History:  Diagnosis Date  . Arthritis   . Asbestosis ([place])   . Cyst of kidney, acquired   . Headache   . History of fracture of leg    Right, childhood  . Hx of migraine headaches   . Hyperlipidemia   . Neuropathy   . PE (pulmonary thromboembolism) ([place])    After knee surgery  . Prostate cancer ([place]) 2015   Adenocarcinoma by biopsy    Past Surgical History:  Procedure Laterality Date  . BIOPSY PROSTATE  2003 and 2005  . COLONOSCOPY  11/28/2008   Dr. Rourk:internal hemorrhoids/tortus colon/ascending colon polyps, tubular adenomas  . COLONOSCOPY N/A 09/04/2014   Procedure: COLONOSCOPY;  Surgeon: Daneil Dolin, MD;  [facility];  Service: Endoscopy;  Laterality: N/A;  9:30  . COLONOSCOPY N/A 07/17/2017   Procedure: COLONOSCOPY;  Surgeon: Danie Binder, MD;  [facility];  Service: Endoscopy;  Laterality: N/A;  . ESOPHAGOGASTRODUODENOSCOPY N/A 07/16/2017   Procedure: ESOPHAGOGASTRODUODENOSCOPY (EGD);  Surgeon: Danie Binder, MD;  [facility];  Service: Endoscopy;  Laterality: N/A;  . GIVENS CAPSULE STUDY  07/16/2017   Procedure: GIVENS CAPSULE STUDY;  Surgeon: Danie Binder, MD;  [facility];  Service:  Endoscopy;;  . POLYPECTOMY  07/17/2017   Procedure: POLYPECTOMY;  Surgeon: Danie Binder, MD;  [facility];  Service: Endoscopy;;  cecal  . TOTAL KNEE ARTHROPLASTY Right 06/10/2017  . TOTAL KNEE ARTHROPLASTY Right 06/10/2017   Procedure: TOTAL KNEE ARTHROPLASTY;  Surgeon: [name], MD;  Location: [facility];  Service: Orthopedics;  Laterality: Right;    There were no vitals filed for this visit.      Subjective Assessment - 07/31/17 0944    Subjective Pt. stated knee is feeling good today, no reports of pain just a little tight/sore.  Stated he has began practicing reciprocal pattern wtih stairs at home.  Plans to go to [place] and purchase compression hose, currently wearing TED hose.   Pertinent History xarelto, history of PE, history of prostate cancer (just monitoring now), neuropathy    Patient Stated Goals get back to normal ASAP    Currently in Pain? No/denies                         [facility] Adult PT Treatment/Exercise - 07/31/17 0001      Knee/Hip Exercises: Stretches   Quad Stretch Right;3 reps;30 seconds   Quad Stretch Limitations prone    Knee: Self-Stretch to increase Flexion Right   Knee: Self-Stretch Limitations Knee drives on stairs 59F 15" holds     Knee/Hip Exercises: Aerobic   Stationary  Bike full revolutation seat 17 x 5 min for mobilty (not included with charge)     Knee/Hip Exercises: Standing   Step Down Right;10 reps;Hand Hold: 1  7in step height   Step Down Limitations cueing for eccentric control   Wall Squat 15 reps;5 seconds   Stairs 5RT     Knee/Hip Exercises: Supine   Heel Slides 5 reps   Heel Slides Limitations 3 second holds    Knee Flexion Limitations 113 degrees following manual     Knee/Hip Exercises: Prone   Contract/Relax to Increase Flexion 5 reps     Manual Therapy   Manual Therapy Edema management;Joint mobilization;Soft tissue mobilization   Manual therapy comments performed separately from all otehr  skilled services    Edema Management Retro massage with LE elevated    Joint Mobilization patella mobs; tib on femur flexion mobilization grade III, patella mobilization all directions; fibular head mobs grade III     Soft tissue mobilization STM to R quad and ITB                  PT Short Term Goals - 07/13/17 1010      PT SHORT TERM GOAL #1   Title Patient to demonstrate R knee ROM as being 0-115 degrees in order to improve mechanics and assist in return to PLOF    Baseline 7/23- at best 0 extension and 103 flexion but today 3-90 possibly due to increased edema    Time 3   Period Weeks   Status On-going     PT SHORT TERM GOAL #2   Title Patient to be ambulating with no assistive device, consistent heel toe pattern, no unsteadiness to improve mobiilty and assist in return to PLOF    Baseline 7/23- achieved    Time 3   Period Weeks   Status Achieved     PT SHORT TERM GOAL #3   Title patient to be independent in correct performance of scar mobilization, patella mobility, and edema control in order to improve self-efficacy in managing condition    Baseline 7/23- has been controlling edema I, discussed patella and scar mobility today    Time 3   Period Weeks   Status Partially Met     PT SHORT TERM GOAL #4   Title Patient to be compliant with correct performance of HEP, to be updated PRN    Baseline 7/23- compliant    Time 1   Period Weeks   Status Achieved           PT Long Term Goals - 07/13/17 1014      PT LONG TERM GOAL #1   Title Patient to demonstrate functional strength as being 5/5 in all tested muscle groups in order to improve giat and balance    Baseline 7/23- hip extensors remain weak    Time 6   Period Weeks   Status Partially Met     PT LONG TERM GOAL #2   Title Patient to be able to complete TUG test in 10 seconds with no AD in order to show improved balance/reduced fall risk    Baseline 7/23- 9.9 no device    Time 6   Period Weeks    Status Achieved     PT LONG TERM GOAL #3   Title Patient to be able to perform mock-bowling exercise in clinic with no LOB and good form in order to ensure safety and success with individual attempt to return to bowling based tasks  Baseline 7/23- not attempted yet    Time 6   Period Weeks   Status On-going     PT LONG TERM GOAL #4   Title Patient to be able to ascend/descent full flight of stairs with U railing, no AD, reciprocal pattern, good eccentric control in order to show improved community and home access    Baseline 7/23- limited by R knee stiffness but able to perform reciprocal pattern    Time 6   Period Weeks   Status On-going               Plan - 07/31/17 1224    Clinical Impression Statement Session focus on knee mobitliy and functional strengthening.  Continue wiht extensive manual technqiues to address tightness in quad, ITB and edema control to assist wiht knee flexion.  Positive results following contract relax technqiue this session wiht improve knee flexion to 113 degrees.  Progressed functional strengthening with stair training, pt able to demonstrate appropriate reciprocal pattern, noted increased difficulty with eccentric control descending.  No reports of pain through session, pt has plans to purchase compression hose later today to assist with edema control.     Rehab Potential Good   Clinical Impairments Affecting Rehab Potential (+) motivated to participate in PT/return to PLOF, minimal PMH   PT Frequency 3x / week   PT Duration 3 weeks   PT Treatment/Interventions ADLs/Self Care Home Management;Biofeedback;Cryotherapy;DME Instruction;Gait training;Stair training;Functional mobility training;Therapeutic activities;Therapeutic exercise;Balance training;Neuromuscular re-education;Patient/family education;Manual techniques;Scar mobilization;Passive range of motion;Dry needling   PT Next Visit Plan continue aggressive manual for knee flexion. Eccentric  strength, hip extension strength. Stair training    PT Home Exercise Plan Eval: quad sets, heel slides, lateral weight shfits, seated knee flexion stretch; 7/18 prone quad stretch       Patient will benefit from skilled therapeutic intervention in order to improve the following deficits and impairments:  Abnormal gait, Decreased skin integrity, Pain, Decreased mobility, Decreased scar mobility, Decreased activity tolerance, Decreased range of motion, Decreased strength, Hypomobility, Decreased balance, Difficulty walking, Increased edema, Impaired flexibility  Visit Diagnosis: Stiffness of right knee, not elsewhere classified  Localized edema  Difficulty in walking, not elsewhere classified  Unsteadiness on feet     Problem List Patient Active Problem List   Diagnosis Date Noted  . Pulmonary nodule 07/19/2017  . Acute deep vein thrombosis (DVT) of right lower extremity ([place])   . Heme + stool 07/15/2017  . Constipation 07/15/2017  . Melena 07/15/2017  . Acute blood loss anemia 07/15/2017  . Postoperative anemia 06/25/2017  . Pulmonary embolism ([place]) 06/18/2017  . Primary localized osteoarthritis of right knee 06/10/2017  . Upper airway cough syndrome 08/29/2015  . Prostate cancer ([place]) 08/17/2015  . Exposure to asbestos 08/17/2015  . Personal history of colonic polyps 08/08/2014  . Joint pain 06/05/2014  . Vitamin D deficiency 06/05/2014  . BPH (benign prostatic hypertrophy) 06/01/2013  . Mild hyperlipidemia 06/01/2013  . Routine general medical examination at a health care facility 06/01/2013   [place], [place]; [place]  Aldona Lento 07/31/2017, 12:29 PM  [facility] [address] [place], [place], [address] Phone: [phone]   Fax:  [phone]  Name: Luis Stewart MRN: [id number] Date of Birth: [birth year]

## 2017-08-03 ENCOUNTER — Ambulatory Visit (HOSPITAL_COMMUNITY): Payer: Medicare Other | Admitting: Physical Therapy

## 2017-08-03 DIAGNOSIS — M25661 Stiffness of right knee, not elsewhere classified: Secondary | ICD-10-CM

## 2017-08-03 DIAGNOSIS — R262 Difficulty in walking, not elsewhere classified: Secondary | ICD-10-CM

## 2017-08-03 DIAGNOSIS — R600 Localized edema: Secondary | ICD-10-CM | POA: Diagnosis not present

## 2017-08-03 DIAGNOSIS — R2681 Unsteadiness on feet: Secondary | ICD-10-CM

## 2017-08-03 NOTE — Therapy (Signed)
[place], [place], [address] Phone: [phone]   Fax:  [phone]  Physical Therapy Treatment (Re-Assessment)  Patient Details  Name: Luis Stewart MRN: [id number] Date of Birth: 01-25-1951 Referring Provider: Kathryne Hitch   Encounter Date: 08/03/2017      PT End of Session - 08/03/17 1032    Visit Number 16   Number of Visits 22   Date for PT Re-Evaluation 08/24/17   Authorization Type Medicare primary, BCBS supplementary (G-codes done Mar 07, 2023 visit)   Authorization Time Period 3/0/16 to 0/10/93; recert done 2/35    Authorization - Visit Number 19   Authorization - Number of Visits 26   PT Start Time 0947   PT Stop Time 1027   PT Time Calculation (min) 40 min   Activity Tolerance Patient tolerated treatment well   Behavior During Therapy [facility] for tasks assessed/performed      Past Medical History:  Diagnosis Date  . Arthritis   . Asbestosis ([place])   . Cyst of kidney, acquired   . Headache   . History of fracture of leg    Right, childhood  . Hx of migraine headaches   . Hyperlipidemia   . Neuropathy   . PE (pulmonary thromboembolism) ([place])    After knee surgery  . Prostate cancer ([place]) 2015   Adenocarcinoma by biopsy    Past Surgical History:  Procedure Laterality Date  . BIOPSY PROSTATE  2003 and 2005  . COLONOSCOPY  11/28/2008   Dr. Rourk:internal hemorrhoids/tortus colon/ascending colon polyps, tubular adenomas  . COLONOSCOPY N/A 09/04/2014   Procedure: COLONOSCOPY;  Surgeon: Daneil Dolin, MD;  [facility];  Service: Endoscopy;  Laterality: N/A;  9:30  . COLONOSCOPY N/A 07/17/2017   Procedure: COLONOSCOPY;  Surgeon: Danie Binder, MD;  [facility];  Service: Endoscopy;  Laterality: N/A;  . ESOPHAGOGASTRODUODENOSCOPY N/A 07/16/2017   Procedure: ESOPHAGOGASTRODUODENOSCOPY (EGD);  Surgeon: Danie Binder, MD;  [facility];  Service: Endoscopy;   Laterality: N/A;  . GIVENS CAPSULE STUDY  07/16/2017   Procedure: GIVENS CAPSULE STUDY;  Surgeon: Danie Binder, MD;  [facility];  Service: Endoscopy;;  . POLYPECTOMY  07/17/2017   Procedure: POLYPECTOMY;  Surgeon: Danie Binder, MD;  [facility];  Service: Endoscopy;;  cecal  . TOTAL KNEE ARTHROPLASTY Right 06/10/2017  . TOTAL KNEE ARTHROPLASTY Right 06/10/2017   Procedure: TOTAL KNEE ARTHROPLASTY;  Surgeon: [name], MD;  Location: [facility];  Service: Orthopedics;  Laterality: Right;    There were no vitals filed for this visit.      Subjective Assessment - 08/03/17 0948    Subjective Patient arrives stating taht he is feeling well, bending is still the hardest thing for him to do. He has the compression stockings from [place].    Pertinent History xarelto, history of PE, history of prostate cancer (just monitoring now), neuropathy    How long can you sit comfortably? 8/13- unlimited    How long can you stand comfortably? 8/13- 20-30 minutes    How long can you walk comfortably? 8/13- unlimited    Patient Stated Goals get back to normal ASAP    Currently in Pain? Yes   Pain Score 1    Pain Location Knee   Pain Orientation Right   Pain Descriptors / Indicators Aching;Sore   Pain Type Surgical pain;Chronic pain   Pain Radiating Towards none    Pain Onset  More than a month ago   Pain Frequency Constant   Aggravating Factors  sitting still too long    Pain Relieving Factors ice and elevation    Effect of Pain on Daily Activities mild impact             [facility] PT Assessment - 08/03/17 0001      Assessment   Medical Diagnosis R TKR    Referring Provider Kathryne Hitch    Onset Date/Surgical Date 06/10/17   Next MD Visit Dr. Percell Miller September 11th    Prior Therapy HHPT for TKR, has been DCed      Precautions   Precautions Fall;Other (comment)   Precaution Comments active xarelto      Balance Screen   Has the patient fallen in the past 6  months No   Has the patient had a decrease in activity level because of a fear of falling?  No   Is the patient reluctant to leave their home because of a fear of falling?  No     Prior Function   Level of Independence Independent;Independent with basic ADLs;Independent with gait;Independent with transfers   Vocation Retired   Leisure bowling      AROM   Right Knee Extension 3   Right Knee Flexion 98     Strength   Right Hip Flexion 5/5   Right Hip Extension 3+/5   Right Hip ABduction 4+/5   Left Hip Flexion 5/5   Left Hip Extension 4-/5   Left Hip ABduction 5/5   Right Knee Flexion 5/5   Right Knee Extension 5/5   Left Knee Flexion 5/5   Left Knee Extension 5/5   Right Ankle Dorsiflexion 5/5   Left Ankle Dorsiflexion 5/5     Ambulation/Gait   Gait Comments R knee reduced lfexion; stairs R knee stiffness, eccetnric weakness      6 minute walk test results    Aerobic Endurance Distance Walked 678   Endurance additional comments 3MWT no device      High Level Balance   High Level Balance Comments TUG 7.45 no device                      [facility] Adult PT Treatment/Exercise - 08/03/17 0001      Knee/Hip Exercises: Stretches   Knee: Self-Stretch to increase Flexion Right   Knee: Self-Stretch Limitations 15x15 second holds      Knee/Hip Exercises: Aerobic   Stationary Bike full revolutation seat 16 x 10 min for mobilty (not included with charge)  EOS not included in billing              Balance Exercises - 08/03/17 1022      Balance Exercises: Standing   Tandem Stance Eyes open;Foam/compliant surface;1 rep  U UE flexion with weighted bar x5 each side            PT Education - 08/03/17 1032    Education provided Yes   Education Details progress with skilled PT services thus far, advised to buy minibike for home use for knee flexion ROM, POC moving forward    Person(s) Educated Patient   Methods Explanation   Comprehension Verbalized  understanding          PT Short Term Goals - 08/03/17 1007      PT SHORT TERM GOAL #1   Title Patient to demonstrate R knee ROM as being 0-115 degrees in order to improve mechanics and assist in return  to PLOF    Baseline 8/13- at best 0 extension adn 113 flexion but today 3-98    Time 3   Period Weeks   Status On-going     PT SHORT TERM GOAL #2   Title Patient to be ambulating with no assistive device, consistent heel toe pattern, no unsteadiness to improve mobiilty and assist in return to PLOF    Time 3   Period Weeks   Status Achieved     PT SHORT TERM GOAL #3   Title patient to be independent in correct performance of scar mobilization, patella mobility, and edema control in order to improve self-efficacy in managing condition    Baseline 8/13- compliant    Time 3   Period Weeks   Status Achieved     PT SHORT TERM GOAL #4   Title Patient to be compliant with correct performance of HEP, to be updated PRN    Baseline 8/13- compliant    Period Weeks   Status Achieved           PT Long Term Goals - 08/03/17 1008      PT LONG TERM GOAL #1   Title Patient to demonstrate functional strength as being 5/5 in all tested muscle groups in order to improve giat and balance    Baseline 8/13- mild weakness    Time 6   Period Weeks   Status Partially Met     PT LONG TERM GOAL #2   Title Patient to be able to complete TUG test in 10 seconds with no AD in order to show improved balance/reduced fall risk    Time 6   Period Weeks   Status Achieved     PT LONG TERM GOAL #3   Title Patient to be able to perform mock-bowling exercise in clinic with no LOB and good form in order to ensure safety and success with individual attempt to return to bowling based tasks    Baseline 8/13- attempted, mild-moderate LOB but able to correct      PT LONG TERM GOAL #4   Title Patient to be able to ascend/descent full flight of stairs with U railing, no AD, reciprocal pattern, good eccentric  control in order to show improved community and home access    Baseline 8/13- stiff and eccentric weakness noted    Time 6   Period Weeks   Status On-going               Plan - 08/03/17 1033    Clinical Impression Statement Re-assessment performed today. Patient continues to make progress with skilled PT services however also continues to demonstrate significant deficits in R knee flexion; he has reached as much as 113 degrees after skilled session with PT however has had trouble consistently maintaining this. Also noted difficulty with dynamic balance with mock-bowling task, patient able to self correct but per today's assessment would likely have difficulty bowling safely independently. Recommend continuation of skilled PT services for focus on R knee ROM as well as dynamic balance moving forward.    Rehab Potential Good   Clinical Impairments Affecting Rehab Potential (+) motivated to participate in PT/return to PLOF, minimal PMH   PT Frequency 2x / week   PT Duration 3 weeks   PT Treatment/Interventions ADLs/Self Care Home Management;Biofeedback;Cryotherapy;DME Instruction;Gait training;Stair training;Functional mobility training;Therapeutic activities;Therapeutic exercise;Balance training;Neuromuscular re-education;Patient/family education;Manual techniques;Scar mobilization;Passive range of motion;Dry needling   PT Next Visit Plan continue aggressive attention to R knee flexion ROM; dynamic balance with  weighted objects. Quad eccetnric strength and stair training    PT Home Exercise Plan Eval: quad sets, heel slides, lateral weight shfits, seated knee flexion stretch; 7/18 prone quad stretch    Consulted and Agree with Plan of Care Patient      Patient will benefit from skilled therapeutic intervention in order to improve the following deficits and impairments:  Abnormal gait, Decreased skin integrity, Pain, Decreased mobility, Decreased scar mobility, Decreased activity tolerance,  Decreased range of motion, Decreased strength, Hypomobility, Decreased balance, Difficulty walking, Increased edema, Impaired flexibility  Visit Diagnosis: Stiffness of right knee, not elsewhere classified - Plan: PT plan of care cert/re-cert  Localized edema - Plan: PT plan of care cert/re-cert  Difficulty in walking, not elsewhere classified - Plan: PT plan of care cert/re-cert  Unsteadiness on feet - Plan: PT plan of care cert/re-cert       G-Codes - August 29, 2017 1034    Functional Assessment Tool Used (Outpatient Only) Baed on skilled clinical assessment of ROM, strength, gait, balance    Functional Limitation Mobility: Walking and moving around   Mobility: Walking and Moving Around Current Status (C4888) At least 20 percent but less than 40 percent impaired, limited or restricted   Mobility: Walking and Moving Around Goal Status (B1694) At least 1 percent but less than 20 percent impaired, limited or restricted      Problem List Patient Active Problem List   Diagnosis Date Noted  . Pulmonary nodule 07/19/2017  . Acute deep vein thrombosis (DVT) of right lower extremity ([place])   . Heme + stool 07/15/2017  . Constipation 07/15/2017  . Melena 07/15/2017  . Acute blood loss anemia 07/15/2017  . Postoperative anemia 06/25/2017  . Pulmonary embolism ([place]) 06/18/2017  . Primary localized osteoarthritis of right knee 06/10/2017  . Upper airway cough syndrome 08/29/2015  . Prostate cancer ([place]) 08/17/2015  . Exposure to asbestos 08/17/2015  . Personal history of colonic polyps 08/08/2014  . Joint pain 06/05/2014  . Vitamin D deficiency 06/05/2014  . BPH (benign prostatic hypertrophy) 06/01/2013  . Mild hyperlipidemia 06/01/2013  . Routine general medical examination at a health care facility 06/01/2013    Deniece Ree PT, DPT [facility] [address] [place], [place], [address] Phone: [phone]   Fax:   [phone]  Name: Luis Stewart MRN: [id number] Date of Birth: 10-31-1951

## 2017-08-05 ENCOUNTER — Ambulatory Visit (HOSPITAL_COMMUNITY): Payer: Medicare Other

## 2017-08-07 ENCOUNTER — Ambulatory Visit (HOSPITAL_COMMUNITY): Payer: Medicare Other

## 2017-08-07 DIAGNOSIS — R2681 Unsteadiness on feet: Secondary | ICD-10-CM

## 2017-08-07 DIAGNOSIS — R600 Localized edema: Secondary | ICD-10-CM

## 2017-08-07 DIAGNOSIS — R262 Difficulty in walking, not elsewhere classified: Secondary | ICD-10-CM | POA: Diagnosis not present

## 2017-08-07 DIAGNOSIS — M25661 Stiffness of right knee, not elsewhere classified: Secondary | ICD-10-CM

## 2017-08-07 NOTE — Therapy (Signed)
[place], [place], [address] Phone: [phone]   Fax:  [phone]  Physical Therapy Treatment  Patient Details  Name: Luis Stewart MRN: [id number] Date of Birth: 04/28/1951 Referring Provider: Kathryne Hitch   Encounter Date: 08/07/2017      PT End of Session - 08/07/17 1013    Visit Number 17   Number of Visits 22   Date for PT Re-Evaluation 08/24/17   Authorization Type Medicare primary, BCBS supplementary (G-codes done 03/24/23 visit)   Authorization Time Period 01/25/25 to 8/34/19; recert done 6/22    Authorization - Visit Number 101   Authorization - Number of Visits 26   PT Start Time [id number]   PT Stop Time 1030   PT Time Calculation (min) 38 min   Activity Tolerance Patient tolerated treatment well   Behavior During Therapy [facility] for tasks assessed/performed      Past Medical History:  Diagnosis Date  . Arthritis   . Asbestosis ([place])   . Cyst of kidney, acquired   . Headache   . History of fracture of leg    Right, childhood  . Hx of migraine headaches   . Hyperlipidemia   . Neuropathy   . PE (pulmonary thromboembolism) ([place])    After knee surgery  . Prostate cancer ([place]) 2015   Adenocarcinoma by biopsy    Past Surgical History:  Procedure Laterality Date  . BIOPSY PROSTATE  2003 and 2005  . COLONOSCOPY  11/28/2008   Dr. Rourk:internal hemorrhoids/tortus colon/ascending colon polyps, tubular adenomas  . COLONOSCOPY N/A 09/04/2014   Procedure: COLONOSCOPY;  Surgeon: Daneil Dolin, MD;  [facility];  Service: Endoscopy;  Laterality: N/A;  9:30  . COLONOSCOPY N/A 07/17/2017   Procedure: COLONOSCOPY;  Surgeon: Danie Binder, MD;  [facility];  Service: Endoscopy;  Laterality: N/A;  . ESOPHAGOGASTRODUODENOSCOPY N/A 07/16/2017   Procedure: ESOPHAGOGASTRODUODENOSCOPY (EGD);  Surgeon: Danie Binder, MD;  [facility];  Service: Endoscopy;  Laterality: N/A;  .  GIVENS CAPSULE STUDY  07/16/2017   Procedure: GIVENS CAPSULE STUDY;  Surgeon: Danie Binder, MD;  [facility];  Service: Endoscopy;;  . POLYPECTOMY  07/17/2017   Procedure: POLYPECTOMY;  Surgeon: Danie Binder, MD;  [facility];  Service: Endoscopy;;  cecal  . TOTAL KNEE ARTHROPLASTY Right 06/10/2017  . TOTAL KNEE ARTHROPLASTY Right 06/10/2017   Procedure: TOTAL KNEE ARTHROPLASTY;  Surgeon: [name], MD;  Location: [facility];  Service: Orthopedics;  Laterality: Right;    There were no vitals filed for this visit.      Subjective Assessment - 08/07/17 0955    Subjective Pt reports little of new. HEP continues to go well. No significant pain, mostlyjust stiffness. Hes been working on stairs at the house as well.    Pertinent History xarelto, history of PE, history of prostate cancer (just monitoring now), neuropathy    Currently in Pain? No/denies                         [facility] Adult PT Treatment/Exercise - 08/07/17 0001      Knee/Hip Exercises: Stretches   Knee: Self-Stretch to increase Flexion Right   Knee: Self-Stretch Limitations 15x15 second holds      Knee/Hip Exercises: Standing   Lateral Step Up Both;1 set;15 reps;Step Height: 6";Hand Hold: 0   Forward Step Up Both;1 set;15 reps;Hand Hold: 0;Step Height: 6"  Knee/Hip Exercises: Prone   Contract/Relax to Increase Flexion *see below     Manual Therapy   Manual Therapy Soft tissue mobilization;Joint mobilization;Muscle Energy Technique   Joint Mobilization seated longitudinal traction at 90 degrees: 10lbs x 32mnutes   Soft tissue mobilization MFR to distal quads x 8 minutes   Muscle Energy Technique prone contract relax stretching: 5sec"/25"off  x5                  PT Short Term Goals - 08/03/17 1007      PT SHORT TERM GOAL #1   Title Patient to demonstrate R knee ROM as being 0-115 degrees in order to improve mechanics and assist in return to PLOF    Baseline  8/13- at best 0 extension adn 113 flexion but today 3-98    Time 3   Period Weeks   Status On-going     PT SHORT TERM GOAL #2   Title Patient to be ambulating with no assistive device, consistent heel toe pattern, no unsteadiness to improve mobiilty and assist in return to PLOF    Time 3   Period Weeks   Status Achieved     PT SHORT TERM GOAL #3   Title patient to be independent in correct performance of scar mobilization, patella mobility, and edema control in order to improve self-efficacy in managing condition    Baseline 8/13- compliant    Time 3   Period Weeks   Status Achieved     PT SHORT TERM GOAL #4   Title Patient to be compliant with correct performance of HEP, to be updated PRN    Baseline 8/13- compliant    Period Weeks   Status Achieved           PT Long Term Goals - 08/03/17 1008      PT LONG TERM GOAL #1   Title Patient to demonstrate functional strength as being 5/5 in all tested muscle groups in order to improve giat and balance    Baseline 8/13- mild weakness    Time 6   Period Weeks   Status Partially Met     PT LONG TERM GOAL #2   Title Patient to be able to complete TUG test in 10 seconds with no AD in order to show improved balance/reduced fall risk    Time 6   Period Weeks   Status Achieved     PT LONG TERM GOAL #3   Title Patient to be able to perform mock-bowling exercise in clinic with no LOB and good form in order to ensure safety and success with individual attempt to return to bowling based tasks    Baseline 8/13- attempted, mild-moderate LOB but able to correct      PT LONG TERM GOAL #4   Title Patient to be able to ascend/descent full flight of stairs with U railing, no AD, reciprocal pattern, good eccentric control in order to show improved community and home access    Baseline 8/13- stiff and eccentric weakness noted    Time 6   Period Weeks   Status On-going               Plan - 08/07/17 1013    Clinical Impression  Statement Pt tolerating session well. Continued focus on Flexion ROM and functional strengthening. Strength is excellent, not adequately challenged in session, despite being progressed. Pt continues to make steady progress toward goals overall.  taught patient quadruped knee flexion stretch, but rquired heavy correction  to avoid heavy torsion through pelvis and lumbar spine. Knee flexion ROM at ~100 degrees at end of session., similar to prone indicative of capsular restriction more than muscular.    Rehab Potential Good   Clinical Impairments Affecting Rehab Potential (+) motivated to participate in PT/return to PLOF, minimal PMH   PT Frequency 2x / week   PT Duration 3 weeks   PT Treatment/Interventions ADLs/Self Care Home Management;Biofeedback;Cryotherapy;DME Instruction;Gait training;Stair training;Functional mobility training;Therapeutic activities;Therapeutic exercise;Balance training;Neuromuscular re-education;Patient/family education;Manual techniques;Scar mobilization;Passive range of motion;Dry needling   PT Next Visit Plan maintain active-aggressive attention to R knee flexion ROM; dynamic balance with weighted objects. COnmsider 8' steps   PT Home Exercise Plan Eval: quad sets, heel slides, lateral weight shfits, seated knee flexion stretch; 7/18 prone quad stretch    Consulted and Agree with Plan of Care Patient      Patient will benefit from skilled therapeutic intervention in order to improve the following deficits and impairments:  Abnormal gait, Decreased skin integrity, Pain, Decreased mobility, Decreased scar mobility, Decreased activity tolerance, Decreased range of motion, Decreased strength, Hypomobility, Decreased balance, Difficulty walking, Increased edema, Impaired flexibility  Visit Diagnosis: Stiffness of right knee, not elsewhere classified  Localized edema  Difficulty in walking, not elsewhere classified  Unsteadiness on feet     Problem List Patient Active  Problem List   Diagnosis Date Noted  . Pulmonary nodule 07/19/2017  . Acute deep vein thrombosis (DVT) of right lower extremity ([place])   . Heme + stool 07/15/2017  . Constipation 07/15/2017  . Melena 07/15/2017  . Acute blood loss anemia 07/15/2017  . Postoperative anemia 06/25/2017  . Pulmonary embolism ([place]) 06/18/2017  . Primary localized osteoarthritis of right knee 06/10/2017  . Upper airway cough syndrome 08/29/2015  . Prostate cancer ([place]) 08/17/2015  . Exposure to asbestos 08/17/2015  . Personal history of colonic polyps 08/08/2014  . Joint pain 06/05/2014  . Vitamin D deficiency 06/05/2014  . BPH (benign prostatic hypertrophy) 06/01/2013  . Mild hyperlipidemia 06/01/2013  . Routine general medical examination at a health care facility 06/01/2013    Luis Stewart C 08/07/2017, 10:25 AM   10:26 AM, 08/07/17 Etta Grandchild, PT, DPT Physical Therapist at [facility] [phone] (office)     [place] [address] [place], [place], [address] Phone: [phone]   Fax:  [phone]  Name: JAFAR POFFENBERGER MRN: [id number] Date of Birth: December 23, 1950

## 2017-08-10 ENCOUNTER — Ambulatory Visit (HOSPITAL_COMMUNITY): Payer: Medicare Other | Admitting: Physical Therapy

## 2017-08-10 ENCOUNTER — Encounter (HOSPITAL_COMMUNITY): Payer: Self-pay

## 2017-08-10 DIAGNOSIS — R600 Localized edema: Secondary | ICD-10-CM

## 2017-08-10 DIAGNOSIS — M25661 Stiffness of right knee, not elsewhere classified: Secondary | ICD-10-CM

## 2017-08-10 DIAGNOSIS — R2681 Unsteadiness on feet: Secondary | ICD-10-CM

## 2017-08-10 DIAGNOSIS — R262 Difficulty in walking, not elsewhere classified: Secondary | ICD-10-CM

## 2017-08-10 NOTE — Therapy (Signed)
[place], [place], [address] Phone: [phone]   Fax:  [phone]  Physical Therapy Treatment  Patient Details  Name: Luis Stewart MRN: [id number] Date of Birth: May 11, 1951 Referring Provider: Kathryne Hitch   Encounter Date: 08/10/2017      PT End of Session - 08/10/17 1042    Visit Number 18   Number of Visits 22   Date for PT Re-Evaluation 08/24/17   Authorization Type Medicare primary, BCBS supplementary (G-codes done 03/03/2023 visit)   Authorization Time Period 01/28/32 to 3/54/56; recert done 2/56    Authorization - Visit Number 77   Authorization - Number of Visits 26   PT Start Time 0950   PT Stop Time 1028   PT Time Calculation (min) 38 min   Activity Tolerance Patient tolerated treatment well   Behavior During Therapy [facility] for tasks assessed/performed      Past Medical History:  Diagnosis Date  . Arthritis   . Asbestosis ([place])   . Cyst of kidney, acquired   . Headache   . History of fracture of leg    Right, childhood  . Hx of migraine headaches   . Hyperlipidemia   . Neuropathy   . PE (pulmonary thromboembolism) ([place])    After knee surgery  . Prostate cancer ([place]) 2015   Adenocarcinoma by biopsy    Past Surgical History:  Procedure Laterality Date  . BIOPSY PROSTATE  2003 and 2005  . COLONOSCOPY  11/28/2008   Dr. Rourk:internal hemorrhoids/tortus colon/ascending colon polyps, tubular adenomas  . COLONOSCOPY N/A 09/04/2014   Procedure: COLONOSCOPY;  Surgeon: Daneil Dolin, MD;  [facility];  Service: Endoscopy;  Laterality: N/A;  9:30  . COLONOSCOPY N/A 07/17/2017   Procedure: COLONOSCOPY;  Surgeon: Danie Binder, MD;  [facility];  Service: Endoscopy;  Laterality: N/A;  . ESOPHAGOGASTRODUODENOSCOPY N/A 07/16/2017   Procedure: ESOPHAGOGASTRODUODENOSCOPY (EGD);  Surgeon: Danie Binder, MD;  [facility];  Service: Endoscopy;  Laterality: N/A;  .  GIVENS CAPSULE STUDY  07/16/2017   Procedure: GIVENS CAPSULE STUDY;  Surgeon: Danie Binder, MD;  [facility];  Service: Endoscopy;;  . POLYPECTOMY  07/17/2017   Procedure: POLYPECTOMY;  Surgeon: Danie Binder, MD;  [facility];  Service: Endoscopy;;  cecal  . TOTAL KNEE ARTHROPLASTY Right 06/10/2017  . TOTAL KNEE ARTHROPLASTY Right 06/10/2017   Procedure: TOTAL KNEE ARTHROPLASTY;  Surgeon: [name], MD;  Location: [place];  Service: Orthopedics;  Laterality: Right;    There were no vitals filed for this visit.      Subjective Assessment - 08/10/17 0952    Subjective Patient arrives stating not much new is going on, he continues to do well. Main problem continues to be stiffness. He has been working on getting a personal bike to use for more ROM work at home.    Pertinent History xarelto, history of PE, history of prostate cancer (just monitoring now), neuropathy    Patient Stated Goals get back to normal ASAP    Currently in Pain? No/denies            [facility] PT Assessment - 08/10/17 0001      AROM   Right Knee Flexion 106                     [facility] Adult PT Treatment/Exercise - 08/10/17 0001      Knee/Hip Exercises: Stretches  Knee: Self-Stretch to increase Flexion Right   Knee: Self-Stretch Limitations 15x20 second holds      Knee/Hip Exercises: Standing   Gait Training reciprocal stair training U Raiiling, distant S for safety      Knee/Hip Exercises: Seated   Sit to Sand 15 reps;without UE support  eccentric lower      Manual Therapy   Manual Therapy Joint mobilization;Edema management;Muscle Energy Technique   Manual therapy comments performed separately from all otehr skilled services    Joint Mobilization patella mobility all diretions, tib on femur flexion mobilization 4 rounds    Muscle Energy Technique contract rleax for knee flexion x4       Bike seat 16 x10 minutes EOS not included in billing        Balance  Exercises - 08/10/17 1017      Balance Exercises: Standing   Tandem Stance Eyes open;Foam/compliant surface;3 reps;Other (comment)  blue ball raise x5 reps each side   Other Standing Exercises blue ball rotation in tandem stance x5 reps x3 sets each side on foam; SLS ball toss solid surface inside bars             PT Education - 08/10/17 1042    Education provided No          PT Short Term Goals - 08/03/17 1007      PT SHORT TERM GOAL #1   Title Patient to demonstrate R knee ROM as being 0-115 degrees in order to improve mechanics and assist in return to PLOF    Baseline 8/13- at best 0 extension adn 113 flexion but today 3-98    Time 3   Period Weeks   Status On-going     PT SHORT TERM GOAL #2   Title Patient to be ambulating with no assistive device, consistent heel toe pattern, no unsteadiness to improve mobiilty and assist in return to PLOF    Time 3   Period Weeks   Status Achieved     PT SHORT TERM GOAL #3   Title patient to be independent in correct performance of scar mobilization, patella mobility, and edema control in order to improve self-efficacy in managing condition    Baseline 8/13- compliant    Time 3   Period Weeks   Status Achieved     PT SHORT TERM GOAL #4   Title Patient to be compliant with correct performance of HEP, to be updated PRN    Baseline 8/13- compliant    Period Weeks   Status Achieved           PT Long Term Goals - 08/03/17 1008      PT LONG TERM GOAL #1   Title Patient to demonstrate functional strength as being 5/5 in all tested muscle groups in order to improve giat and balance    Baseline 8/13- mild weakness    Time 6   Period Weeks   Status Partially Met     PT LONG TERM GOAL #2   Title Patient to be able to complete TUG test in 10 seconds with no AD in order to show improved balance/reduced fall risk    Time 6   Period Weeks   Status Achieved     PT LONG TERM GOAL #3   Title Patient to be able to perform  mock-bowling exercise in clinic with no LOB and good form in order to ensure safety and success with individual attempt to return to bowling based tasks  Baseline 8/13- attempted, mild-moderate LOB but able to correct      PT LONG TERM GOAL #4   Title Patient to be able to ascend/descent full flight of stairs with U railing, no AD, reciprocal pattern, good eccentric control in order to show improved community and home access    Baseline 8/13- stiff and eccentric weakness noted    Time 6   Period Weeks   Status On-going               Plan - 08/10/17 1043    Clinical Impression Statement Patient arrives stating he is doing well, no major complaints. Continued all manual including MET techniques, then performed reciprocal stair training as well as ongoing eccentric strengthening and dynamic balance work as indicated in PT POC. Patient continues to demonstrate slow ongoing progress with knee ROM moving forward.    Rehab Potential Good   Clinical Impairments Affecting Rehab Potential (+) motivated to participate in PT/return to PLOF, minimal PMH   PT Frequency 2x / week   PT Duration 3 weeks   PT Treatment/Interventions ADLs/Self Care Home Management;Biofeedback;Cryotherapy;DME Instruction;Gait training;Stair training;Functional mobility training;Therapeutic activities;Therapeutic exercise;Balance training;Neuromuscular re-education;Patient/family education;Manual techniques;Scar mobilization;Passive range of motion;Dry needling   PT Next Visit Plan maintain active-aggressive attention to R knee flexion ROM; dynamic balance with weighted objects. COnmsider 8' steps.   PT Home Exercise Plan Eval: quad sets, heel slides, lateral weight shfits, seated knee flexion stretch; 7/18 prone quad stretch    Consulted and Agree with Plan of Care Patient      Patient will benefit from skilled therapeutic intervention in order to improve the following deficits and impairments:  Abnormal gait,  Decreased skin integrity, Pain, Decreased mobility, Decreased scar mobility, Decreased activity tolerance, Decreased range of motion, Decreased strength, Hypomobility, Decreased balance, Difficulty walking, Increased edema, Impaired flexibility  Visit Diagnosis: Stiffness of right knee, not elsewhere classified  Localized edema  Unsteadiness on feet  Difficulty in walking, not elsewhere classified     Problem List Patient Active Problem List   Diagnosis Date Noted  . Pulmonary nodule 07/19/2017  . Acute deep vein thrombosis (DVT) of right lower extremity ([place])   . Heme + stool 07/15/2017  . Constipation 07/15/2017  . Melena 07/15/2017  . Acute blood loss anemia 07/15/2017  . Postoperative anemia 06/25/2017  . Pulmonary embolism ([place]) 06/18/2017  . Primary localized osteoarthritis of right knee 06/10/2017  . Upper airway cough syndrome 08/29/2015  . Prostate cancer ([place]) 08/17/2015  . Exposure to asbestos 08/17/2015  . Personal history of colonic polyps 08/08/2014  . Joint pain 06/05/2014  . Vitamin D deficiency 06/05/2014  . BPH (benign prostatic hypertrophy) 06/01/2013  . Mild hyperlipidemia 06/01/2013  . Routine general medical examination at a health care facility 06/01/2013    Luis Stewart PT, DPT [facility] [address] [place], [place], [address] Phone: [phone]   Fax:  [phone]  Name: Luis Stewart MRN: [id number] Date of Birth: [birth year]

## 2017-08-12 ENCOUNTER — Ambulatory Visit (HOSPITAL_COMMUNITY): Payer: Medicare Other | Admitting: Physical Therapy

## 2017-08-14 ENCOUNTER — Ambulatory Visit (HOSPITAL_COMMUNITY): Payer: Medicare Other

## 2017-08-14 ENCOUNTER — Encounter (HOSPITAL_COMMUNITY): Payer: Self-pay

## 2017-08-14 DIAGNOSIS — R262 Difficulty in walking, not elsewhere classified: Secondary | ICD-10-CM

## 2017-08-14 DIAGNOSIS — R600 Localized edema: Secondary | ICD-10-CM | POA: Diagnosis not present

## 2017-08-14 DIAGNOSIS — M25661 Stiffness of right knee, not elsewhere classified: Secondary | ICD-10-CM | POA: Diagnosis not present

## 2017-08-14 DIAGNOSIS — R2681 Unsteadiness on feet: Secondary | ICD-10-CM

## 2017-08-14 NOTE — Therapy (Signed)
[place], [place], [address] Phone: [phone]   Fax:  [phone]  Physical Therapy Treatment  Patient Details  Name: Luis Stewart MRN: [id number] Date of Birth: [birth year] Referring Provider: Kathryne Hitch   Encounter Date: 08/14/2017      PT End of Session - 08/14/17 0952    Visit Number 19   Number of Visits 22   Date for PT Re-Evaluation 08/24/17   Authorization Type Medicare primary, BCBS supplementary (G-codes done Mar 21, 2023 visit)   Authorization Time Period 08/24/25 to 07/02/44; recert done 8/09    Authorization - Visit Number 63   Authorization - Number of Visits 26   PT Start Time 0950   PT Stop Time 1030   PT Time Calculation (min) 40 min   Activity Tolerance Patient tolerated treatment well   Behavior During Therapy [facility] for tasks assessed/performed      Past Medical History:  Diagnosis Date  . Arthritis   . Asbestosis ([place])   . Cyst of kidney, acquired   . Headache   . History of fracture of leg    Right, childhood  . Hx of migraine headaches   . Hyperlipidemia   . Neuropathy   . PE (pulmonary thromboembolism) ([place])    After knee surgery  . Prostate cancer ([place]) 2015   Adenocarcinoma by biopsy    Past Surgical History:  Procedure Laterality Date  . BIOPSY PROSTATE  2003 and 2005  . COLONOSCOPY  11/28/2008   Dr. Rourk:internal hemorrhoids/tortus colon/ascending colon polyps, tubular adenomas  . COLONOSCOPY N/A 09/04/2014   Procedure: COLONOSCOPY;  Surgeon: Daneil Dolin, MD;  [facility];  Service: Endoscopy;  Laterality: N/A;  9:30  . COLONOSCOPY N/A 07/17/2017   Procedure: COLONOSCOPY;  Surgeon: Danie Binder, MD;  [facility];  Service: Endoscopy;  Laterality: N/A;  . ESOPHAGOGASTRODUODENOSCOPY N/A 07/16/2017   Procedure: ESOPHAGOGASTRODUODENOSCOPY (EGD);  Surgeon: Danie Binder, MD;  [facility];  Service: Endoscopy;  Laterality: N/A;  .  GIVENS CAPSULE STUDY  07/16/2017   Procedure: GIVENS CAPSULE STUDY;  Surgeon: Danie Binder, MD;  [facility];  Service: Endoscopy;;  . POLYPECTOMY  07/17/2017   Procedure: POLYPECTOMY;  Surgeon: Danie Binder, MD;  [facility];  Service: Endoscopy;;  cecal  . TOTAL KNEE ARTHROPLASTY Right 06/10/2017  . TOTAL KNEE ARTHROPLASTY Right 06/10/2017   Procedure: TOTAL KNEE ARTHROPLASTY;  Surgeon: [name], MD;  Location: [facility];  Service: Orthopedics;  Laterality: Right;    There were no vitals filed for this visit.      Subjective Assessment - 08/14/17 0953    Subjective Pt states that he has not done his exercises this week as he lost his father earlier this week so he has had a lot on his mind. He denies any pain right now.   Pertinent History xarelto, history of PE, history of prostate cancer (just monitoring now), neuropathy    Patient Stated Goals get back to normal ASAP    Currently in Pain? No/denies              [facility] Adult PT Treatment/Exercise - 08/14/17 0001      Knee/Hip Exercises: Stretches   Knee: Self-Stretch to increase Flexion Right   Knee: Self-Stretch Limitations 10x10 sec   Gastroc Stretch Both;3 reps;30 seconds   Gastroc Stretch Limitations slantboard      Knee/Hip Exercises: Machines for Strengthening  Other Machine Biodex 100% extension and flexion x5 mins (started each at 90% and increased with each rep until 100%)     Knee/Hip Exercises: Standing   Lateral Step Up Right;2 sets;15 reps;Hand Hold: 0;Step Height: 8"   Forward Step Up Right;2 sets;15 reps;Hand Hold: 0;Step Height: 8"   SLS on foam and palov press with RTB 2x15reps on RLE only; on foam and ispilateral OH press with 10# DB 2x15 reps     Knee/Hip Exercises: Seated   Sit to Sand 15 reps;without UE support  RTB around knees, eccentric lower     Knee/Hip Exercises: Supine   Knee Extension Limitations 0   Knee Flexion Limitations 110     Manual Therapy    Manual Therapy Joint mobilization   Manual therapy comments performed separately from all otehr skilled services    Joint Mobilization Grade 4 A/P joint mobs and sup/inf patellar mobs               PT Education - 08/14/17 1031    Education provided Yes   Education Details continue HEP   Person(s) Educated Patient   Methods Explanation;Demonstration   Comprehension Verbalized understanding;Returned demonstration          PT Short Term Goals - 08/03/17 1007      PT SHORT TERM GOAL #1   Title Patient to demonstrate R knee ROM as being 0-115 degrees in order to improve mechanics and assist in return to PLOF    Baseline 8/13- at best 0 extension adn 113 flexion but today 3-98    Time 3   Period Weeks   Status On-going     PT SHORT TERM GOAL #2   Title Patient to be ambulating with no assistive device, consistent heel toe pattern, no unsteadiness to improve mobiilty and assist in return to PLOF    Time 3   Period Weeks   Status Achieved     PT SHORT TERM GOAL #3   Title patient to be independent in correct performance of scar mobilization, patella mobility, and edema control in order to improve self-efficacy in managing condition    Baseline 8/13- compliant    Time 3   Period Weeks   Status Achieved     PT SHORT TERM GOAL #4   Title Patient to be compliant with correct performance of HEP, to be updated PRN    Baseline 8/13- compliant    Period Weeks   Status Achieved           PT Long Term Goals - 08/03/17 1008      PT LONG TERM GOAL #1   Title Patient to demonstrate functional strength as being 5/5 in all tested muscle groups in order to improve giat and balance    Baseline 8/13- mild weakness    Time 6   Period Weeks   Status Partially Met     PT LONG TERM GOAL #2   Title Patient to be able to complete TUG test in 10 seconds with no AD in order to show improved balance/reduced fall risk    Time 6   Period Weeks   Status Achieved     PT LONG TERM GOAL  #3   Title Patient to be able to perform mock-bowling exercise in clinic with no LOB and good form in order to ensure safety and success with individual attempt to return to bowling based tasks    Baseline 8/13- attempted, mild-moderate LOB but able to correct  PT LONG TERM GOAL #4   Title Patient to be able to ascend/descent full flight of stairs with U railing, no AD, reciprocal pattern, good eccentric control in order to show improved community and home access    Baseline 8/13- stiff and eccentric weakness noted    Time 6   Period Weeks   Status On-going               Plan - 08/14/17 1031    Clinical Impression Statement Pt presented to therapy with no reports of pain this date. Per POC, continued to focus on flexion and dynamic balance activities. Pt challenged by SLS activities on foam this date. Ended session with manual and biodex PROM for flexion. Pt 0-110 at EOS. Continue POC as planned.   Rehab Potential Good   Clinical Impairments Affecting Rehab Potential (+) motivated to participate in PT/return to PLOF, minimal PMH   PT Frequency 2x / week   PT Duration 3 weeks   PT Treatment/Interventions ADLs/Self Care Home Management;Biofeedback;Cryotherapy;DME Instruction;Gait training;Stair training;Functional mobility training;Therapeutic activities;Therapeutic exercise;Balance training;Neuromuscular re-education;Patient/family education;Manual techniques;Scar mobilization;Passive range of motion;Dry needling   PT Next Visit Plan maintain active-aggressive attention to R knee flexion ROM; progress dynamic balance with weighted objects and functional strengthening   PT Home Exercise Plan Eval: quad sets, heel slides, lateral weight shfits, seated knee flexion stretch; 7/18 prone quad stretch    Consulted and Agree with Plan of Care Patient      Patient will benefit from skilled therapeutic intervention in order to improve the following deficits and impairments:  Abnormal gait,  Decreased skin integrity, Pain, Decreased mobility, Decreased scar mobility, Decreased activity tolerance, Decreased range of motion, Decreased strength, Hypomobility, Decreased balance, Difficulty walking, Increased edema, Impaired flexibility  Visit Diagnosis: Stiffness of right knee, not elsewhere classified  Localized edema  Unsteadiness on feet  Difficulty in walking, not elsewhere classified     Problem List Patient Active Problem List   Diagnosis Date Noted  . Pulmonary nodule 07/19/2017  . Acute deep vein thrombosis (DVT) of right lower extremity ([place])   . Heme + stool 07/15/2017  . Constipation 07/15/2017  . Melena 07/15/2017  . Acute blood loss anemia 07/15/2017  . Postoperative anemia 06/25/2017  . Pulmonary embolism ([place]) 06/18/2017  . Primary localized osteoarthritis of right knee 06/10/2017  . Upper airway cough syndrome 08/29/2015  . Prostate cancer ([place]) 08/17/2015  . Exposure to asbestos 08/17/2015  . Personal history of colonic polyps 08/08/2014  . Joint pain 06/05/2014  . Vitamin D deficiency 06/05/2014  . BPH (benign prostatic hypertrophy) 06/01/2013  . Mild hyperlipidemia 06/01/2013  . Routine general medical examination at a health care facility 06/01/2013      Geraldine Solar PT, [place] [address] [place], [place], [address] Phone: [phone]   Fax:  [phone]  Name: Luis Stewart MRN: [id number] Date of Birth: [birth year]

## 2017-08-17 ENCOUNTER — Encounter (HOSPITAL_COMMUNITY): Payer: Self-pay

## 2017-08-17 ENCOUNTER — Ambulatory Visit (HOSPITAL_COMMUNITY): Payer: Medicare Other | Admitting: Physical Therapy

## 2017-08-17 DIAGNOSIS — M25661 Stiffness of right knee, not elsewhere classified: Secondary | ICD-10-CM | POA: Diagnosis not present

## 2017-08-17 DIAGNOSIS — R2681 Unsteadiness on feet: Secondary | ICD-10-CM

## 2017-08-17 DIAGNOSIS — R600 Localized edema: Secondary | ICD-10-CM | POA: Diagnosis not present

## 2017-08-17 DIAGNOSIS — R262 Difficulty in walking, not elsewhere classified: Secondary | ICD-10-CM | POA: Diagnosis not present

## 2017-08-17 NOTE — Therapy (Signed)
[place], [place], [address] Phone: [phone]   Fax:  [phone]  Physical Therapy Treatment  Patient Details  Name: Luis Stewart MRN: [id number] Date of Birth: September 27, 1951 Referring Provider: Kathryne Hitch   Encounter Date: 08/17/2017      PT End of Session - 08/17/17 0857    Visit Number 20   Number of Visits 22   Date for PT Re-Evaluation 08/24/17   Authorization Type Medicare primary, BCBS supplementary (G-codes done 02-26-2023 visit)   Authorization Time Period 05/29/10 to 5/72/62; recert done 0/35    Authorization - Visit Number 58   Authorization - Number of Visits 26   PT Start Time 0817   PT Stop Time 0857   PT Time Calculation (min) 40 min   Activity Tolerance Patient tolerated treatment well   Behavior During Therapy [facility] for tasks assessed/performed      Past Medical History:  Diagnosis Date  . Arthritis   . Asbestosis ([place])   . Cyst of kidney, acquired   . Headache   . History of fracture of leg    Right, childhood  . Hx of migraine headaches   . Hyperlipidemia   . Neuropathy   . PE (pulmonary thromboembolism) ([place])    After knee surgery  . Prostate cancer ([place]) 2015   Adenocarcinoma by biopsy    Past Surgical History:  Procedure Laterality Date  . BIOPSY PROSTATE  2003 and 2005  . COLONOSCOPY  11/28/2008   Dr. Rourk:internal hemorrhoids/tortus colon/ascending colon polyps, tubular adenomas  . COLONOSCOPY N/A 09/04/2014   Procedure: COLONOSCOPY;  Surgeon: Daneil Dolin, MD;  [facility];  Service: Endoscopy;  Laterality: N/A;  9:30  . COLONOSCOPY N/A 07/17/2017   Procedure: COLONOSCOPY;  Surgeon: Danie Binder, MD;  [facility];  Service: Endoscopy;  Laterality: N/A;  . ESOPHAGOGASTRODUODENOSCOPY N/A 07/16/2017   Procedure: ESOPHAGOGASTRODUODENOSCOPY (EGD);  Surgeon: Danie Binder, MD;  [facility];  Service: Endoscopy;  Laterality: N/A;  .  GIVENS CAPSULE STUDY  07/16/2017   Procedure: GIVENS CAPSULE STUDY;  Surgeon: Danie Binder, MD;  [facility];  Service: Endoscopy;;  . POLYPECTOMY  07/17/2017   Procedure: POLYPECTOMY;  Surgeon: Danie Binder, MD;  [facility];  Service: Endoscopy;;  cecal  . TOTAL KNEE ARTHROPLASTY Right 06/10/2017  . TOTAL KNEE ARTHROPLASTY Right 06/10/2017   Procedure: TOTAL KNEE ARTHROPLASTY;  Surgeon: [name], MD;  Location: [place];  Service: Orthopedics;  Laterality: Right;    There were no vitals filed for this visit.      Subjective Assessment - 08/17/17 0819    Subjective Patient arrives stating he lost his father recently and has been very busy with all of this, he has not done HEP    Pertinent History xarelto, history of PE, history of prostate cancer (just monitoring now), neuropathy    Patient Stated Goals get back to normal ASAP    Currently in Pain? No/denies                         [facility] Adult PT Treatment/Exercise - 08/17/17 0001      Knee/Hip Exercises: Stretches   Knee: Self-Stretch to increase Flexion Right;20 seconds   Knee: Self-Stretch Limitations x20     Knee/Hip Exercises: Standing   Step Down Right;1 set;20 reps   Step Down Limitations 6 inch box  Knee/Hip Exercises: Seated   Sit to Sand 2 sets;without UE support;10 reps  eccetnric lowers, staggered stance red TB around knees      Manual Therapy   Manual Therapy Joint mobilization   Manual therapy comments performed separately from all otehr skilled services    Joint Mobilization patella mobility; grade III-IV mobilation for knee flexion R              Balance Exercises - 08/17/17 0842      Balance Exercises: Standing   Tandem Stance Eyes open;Foam/compliant surface;3 reps  ball toss with red weighted ball increasing difficulty    SLS Eyes open;Solid surface;3 reps  ball toss with soccer ball ioncreasing difficulty    Rockerboard  Anterior/posterior;Lateral;EO;Intermittent UE support;Other (comment)  AP and lateral    Other Standing Exercises simulated bowling task: tandem stance on foam with rolling of yellow weighted ball            PT Education - 08/17/17 0857    Education provided No          PT Short Term Goals - 08/03/17 1007      PT SHORT TERM GOAL #1   Title Patient to demonstrate R knee ROM as being 0-115 degrees in order to improve mechanics and assist in return to PLOF    Baseline 8/13- at best 0 extension adn 113 flexion but today 3-98    Time 3   Period Weeks   Status On-going     PT SHORT TERM GOAL #2   Title Patient to be ambulating with no assistive device, consistent heel toe pattern, no unsteadiness to improve mobiilty and assist in return to PLOF    Time 3   Period Weeks   Status Achieved     PT SHORT TERM GOAL #3   Title patient to be independent in correct performance of scar mobilization, patella mobility, and edema control in order to improve self-efficacy in managing condition    Baseline 8/13- compliant    Time 3   Period Weeks   Status Achieved     PT SHORT TERM GOAL #4   Title Patient to be compliant with correct performance of HEP, to be updated PRN    Baseline 8/13- compliant    Period Weeks   Status Achieved           PT Long Term Goals - 08/03/17 1008      PT LONG TERM GOAL #1   Title Patient to demonstrate functional strength as being 5/5 in all tested muscle groups in order to improve giat and balance    Baseline 8/13- mild weakness    Time 6   Period Weeks   Status Partially Met     PT LONG TERM GOAL #2   Title Patient to be able to complete TUG test in 10 seconds with no AD in order to show improved balance/reduced fall risk    Time 6   Period Weeks   Status Achieved     PT LONG TERM GOAL #3   Title Patient to be able to perform mock-bowling exercise in clinic with no LOB and good form in order to ensure safety and success with individual  attempt to return to bowling based tasks    Baseline 8/13- attempted, mild-moderate LOB but able to correct      PT LONG TERM GOAL #4   Title Patient to be able to ascend/descent full flight of stairs with U railing, no AD, reciprocal pattern, good  eccentric control in order to show improved community and home access    Baseline 8/13- stiff and eccentric weakness noted    Time 6   Period Weeks   Status On-going               Plan - 08/17/17 0149    Clinical Impression Statement Patient arrives today reporting he has not quite gotten back to his HEP yet, he has had a lot going on with the death of his father. Continued with manual to R knee primarily for flexion as well as functional stretches today; otherwise continued with focus on dynamic balance with weights and external perturbations and functional strength moving forward. Patient continues to remain motivated however continues to have difficulty with nagging flexion deficits R knee.    Rehab Potential Good   Clinical Impairments Affecting Rehab Potential (+) motivated to participate in PT/return to PLOF, minimal PMH   PT Frequency 2x / week   PT Duration 3 weeks   PT Treatment/Interventions ADLs/Self Care Home Management;Biofeedback;Cryotherapy;DME Instruction;Gait training;Stair training;Functional mobility training;Therapeutic activities;Therapeutic exercise;Balance training;Neuromuscular re-education;Patient/family education;Manual techniques;Scar mobilization;Passive range of motion;Dry needling   PT Next Visit Plan continue working on R knee flexion, progress dynamic balance with external perturbations    PT Home Exercise Plan Eval: quad sets, heel slides, lateral weight shfits, seated knee flexion stretch; 7/18 prone quad stretch    Consulted and Agree with Plan of Care Patient      Patient will benefit from skilled therapeutic intervention in order to improve the following deficits and impairments:  Abnormal gait,  Decreased skin integrity, Pain, Decreased mobility, Decreased scar mobility, Decreased activity tolerance, Decreased range of motion, Decreased strength, Hypomobility, Decreased balance, Difficulty walking, Increased edema, Impaired flexibility  Visit Diagnosis: Stiffness of right knee, not elsewhere classified  Localized edema  Unsteadiness on feet  Difficulty in walking, not elsewhere classified     Problem List Patient Active Problem List   Diagnosis Date Noted  . Pulmonary nodule 07/19/2017  . Acute deep vein thrombosis (DVT) of right lower extremity ([place])   . Heme + stool 07/15/2017  . Constipation 07/15/2017  . Melena 07/15/2017  . Acute blood loss anemia 07/15/2017  . Postoperative anemia 06/25/2017  . Pulmonary embolism ([place]) 06/18/2017  . Primary localized osteoarthritis of right knee 06/10/2017  . Upper airway cough syndrome 08/29/2015  . Prostate cancer ([place]) 08/17/2015  . Exposure to asbestos 08/17/2015  . Personal history of colonic polyps 08/08/2014  . Joint pain 06/05/2014  . Vitamin D deficiency 06/05/2014  . BPH (benign prostatic hypertrophy) 06/01/2013  . Mild hyperlipidemia 06/01/2013  . Routine general medical examination at a health care facility 06/01/2013    Deniece Ree PT, DPT [place] [address] [place], [place], [address] Phone: [phone]   Fax:  [phone]  Name: CRUZE ZINGARO MRN: [id number] Date of Birth: 04-24-1951

## 2017-08-18 ENCOUNTER — Other Ambulatory Visit: Payer: Self-pay | Admitting: Family Medicine

## 2017-08-19 ENCOUNTER — Telehealth: Payer: Self-pay | Admitting: Family Medicine

## 2017-08-19 ENCOUNTER — Encounter (HOSPITAL_COMMUNITY): Payer: Self-pay | Admitting: Physical Therapy

## 2017-08-19 MED ORDER — APIXABAN 5 MG PO TABS: 5.0000 mg | ORAL_TABLET | Freq: Two times a day (BID) | ORAL | 3 refills | Status: DC

## 2017-08-19 NOTE — Telephone Encounter (Signed)
Pt needs refill on el[facility] [place].

## 2017-08-19 NOTE — Telephone Encounter (Signed)
Refill appropriate and filled per protocol. 

## 2017-08-21 ENCOUNTER — Ambulatory Visit (HOSPITAL_COMMUNITY): Payer: Medicare Other

## 2017-08-21 DIAGNOSIS — R2681 Unsteadiness on feet: Secondary | ICD-10-CM | POA: Diagnosis not present

## 2017-08-21 DIAGNOSIS — R600 Localized edema: Secondary | ICD-10-CM

## 2017-08-21 DIAGNOSIS — R262 Difficulty in walking, not elsewhere classified: Secondary | ICD-10-CM

## 2017-08-21 DIAGNOSIS — M25661 Stiffness of right knee, not elsewhere classified: Secondary | ICD-10-CM

## 2017-08-21 NOTE — Therapy (Signed)
[place], [place], [address] Phone: [phone]   Fax:  [phone]  Physical Therapy Treatment  Patient Details  Name: Luis Stewart MRN: [id number] Date of Birth: [birth year] Referring Provider: Kathryne Hitch   Encounter Date: 08/21/2017      PT End of Session - 08/21/17 0951    Visit Number 21   Number of Visits 22   Date for PT Re-Evaluation 08/24/17   Authorization Type Medicare primary, BCBS supplementary (G-codes done 03-14-23 visit)   Authorization Time Period 1/0/93 to 2/35/57; recert done 3/22    Authorization - Visit Number 21   Authorization - Number of Visits 26   PT Start Time 0950   PT Stop Time 1030   PT Time Calculation (min) 40 min   Activity Tolerance Patient tolerated treatment well   Behavior During Therapy [facility] for tasks assessed/performed      Past Medical History:  Diagnosis Date  . Arthritis   . Asbestosis ([place])   . Cyst of kidney, acquired   . Headache   . History of fracture of leg    Right, childhood  . Hx of migraine headaches   . Hyperlipidemia   . Neuropathy   . PE (pulmonary thromboembolism) ([place])    After knee surgery  . Prostate cancer ([place]) 2015   Adenocarcinoma by biopsy    Past Surgical History:  Procedure Laterality Date  . BIOPSY PROSTATE  2003 and 2005  . COLONOSCOPY  11/28/2008   Dr. Rourk:internal hemorrhoids/tortus colon/ascending colon polyps, tubular adenomas  . COLONOSCOPY N/A 09/04/2014   Procedure: COLONOSCOPY;  Surgeon: Daneil Dolin, MD;  [facility];  Service: Endoscopy;  Laterality: N/A;  9:30  . COLONOSCOPY N/A 07/17/2017   Procedure: COLONOSCOPY;  Surgeon: Danie Binder, MD;  [facility];  Service: Endoscopy;  Laterality: N/A;  . ESOPHAGOGASTRODUODENOSCOPY N/A 07/16/2017   Procedure: ESOPHAGOGASTRODUODENOSCOPY (EGD);  Surgeon: Danie Binder, MD;  [facility];  Service: Endoscopy;  Laterality: N/A;  .  GIVENS CAPSULE STUDY  07/16/2017   Procedure: GIVENS CAPSULE STUDY;  Surgeon: Danie Binder, MD;  [facility];  Service: Endoscopy;;  . POLYPECTOMY  07/17/2017   Procedure: POLYPECTOMY;  Surgeon: Danie Binder, MD;  [facility];  Service: Endoscopy;;  cecal  . TOTAL KNEE ARTHROPLASTY Right 06/10/2017  . TOTAL KNEE ARTHROPLASTY Right 06/10/2017   Procedure: TOTAL KNEE ARTHROPLASTY;  Surgeon: [name], MD;  Location: [place];  Service: Orthopedics;  Laterality: Right;    There were no vitals filed for this visit.      Subjective Assessment - 08/21/17 0950    Subjective Pt states he feels pretty good this morning. No c/o pain.   Pertinent History xarelto, history of PE, history of prostate cancer (just monitoring now), neuropathy    Patient Stated Goals get back to normal ASAP    Currently in Pain? No/denies              [facility] Adult PT Treatment/Exercise - 08/21/17 0001      Knee/Hip Exercises: Stretches   Knee: Self-Stretch to increase Flexion Right   Knee: Self-Stretch Limitations 10x10"   Gastroc Stretch Both;3 reps;30 seconds   Gastroc Stretch Limitations slantboard      Knee/Hip Exercises: Standing   Knee Flexion Right;20 reps   Knee Flexion Limitations at counter to decrease hip flexion compensation   Wall Squat 2 sets;10 reps   Wall  Squat Limitations RTB around knees   Other Standing Knee Exercises wall sits with RTB around knees 5x10" holds   Other Standing Knee Exercises L single leg RDLs with 10# DB x10 reps     Knee/Hip Exercises: Seated   Stool Scoot - Round Trips fwd 18f x2RT          Balance Exercises - 08/21/17 1010      Balance Exercises: Standing   Tandem Stance Eyes closed;Foam/compliant surface;5 reps;10 secs   Other Standing Exercises simulated bowling task: tandem stance on foam with rolling of blue weighted ball; R SLS on foam and palov press with GTB 3x10; tandem stance on foam with EC 5x10" each; tandem stance on  foam with external perturbations x1 mins each                PT Education - 08/21/17 1031    Education provided Yes   Education Details added L single leg RDLs to HEP as pt stated he did not feel confident bowling on his L leg   Person(s) Educated Patient   Methods Explanation;Demonstration;Handout   Comprehension Verbalized understanding;Returned demonstration          PT Short Term Goals - 08/03/17 1007      PT SHORT TERM GOAL #1   Title Patient to demonstrate R knee ROM as being 0-115 degrees in order to improve mechanics and assist in return to PLOF    Baseline 8/13- at best 0 extension adn 113 flexion but today 3-98    Time 3   Period Weeks   Status On-going     PT SHORT TERM GOAL #2   Title Patient to be ambulating with no assistive device, consistent heel toe pattern, no unsteadiness to improve mobiilty and assist in return to PLOF    Time 3   Period Weeks   Status Achieved     PT SHORT TERM GOAL #3   Title patient to be independent in correct performance of scar mobilization, patella mobility, and edema control in order to improve self-efficacy in managing condition    Baseline 8/13- compliant    Time 3   Period Weeks   Status Achieved     PT SHORT TERM GOAL #4   Title Patient to be compliant with correct performance of HEP, to be updated PRN    Baseline 8/13- compliant    Period Weeks   Status Achieved           PT Long Term Goals - 08/03/17 1008      PT LONG TERM GOAL #1   Title Patient to demonstrate functional strength as being 5/5 in all tested muscle groups in order to improve giat and balance    Baseline 8/13- mild weakness    Time 6   Period Weeks   Status Partially Met     PT LONG TERM GOAL #2   Title Patient to be able to complete TUG test in 10 seconds with no AD in order to show improved balance/reduced fall risk    Time 6   Period Weeks   Status Achieved     PT LONG TERM GOAL #3   Title Patient to be able to perform  mock-bowling exercise in clinic with no LOB and good form in order to ensure safety and success with individual attempt to return to bowling based tasks    Baseline 8/13- attempted, mild-moderate LOB but able to correct      PT LONG TERM GOAL #4  Title Patient to be able to ascend/descent full flight of stairs with U railing, no AD, reciprocal pattern, good eccentric control in order to show improved community and home access    Baseline 8/13- stiff and eccentric weakness noted    Time 6   Period Weeks   Status On-going               Plan - 08/21/17 1033    Clinical Impression Statement Session focused on improving R knee flexion and overall strength, as well as dynamic balance this date. Pt did well throughout entire session, reporting fatigue especially with wall sits/squats. Pt verbalized that he did not feel confident bowling on his L leg (this is his planting leg as he is R handed). PT encouraged pt to go bowling soon as it is more than likely a confidence issue. Also added L single leg RDLs to strengthening pt in this position and he verbalized understanding. Pt due for reassessment next session.   Rehab Potential Good   Clinical Impairments Affecting Rehab Potential (+) motivated to participate in PT/return to PLOF, minimal PMH   PT Frequency 2x / week   PT Duration 3 weeks   PT Treatment/Interventions ADLs/Self Care Home Management;Biofeedback;Cryotherapy;DME Instruction;Gait training;Stair training;Functional mobility training;Therapeutic activities;Therapeutic exercise;Balance training;Neuromuscular re-education;Patient/family education;Manual techniques;Scar mobilization;Passive range of motion;Dry needling   PT Next Visit Plan reassessment   PT Home Exercise Plan Eval: quad sets, heel slides, lateral weight shfits, seated knee flexion stretch; 7/18 prone quad stretch; 8/31: L single leg RDLs   Consulted and Agree with Plan of Care Patient      Patient will benefit from  skilled therapeutic intervention in order to improve the following deficits and impairments:  Abnormal gait, Decreased skin integrity, Pain, Decreased mobility, Decreased scar mobility, Decreased activity tolerance, Decreased range of motion, Decreased strength, Hypomobility, Decreased balance, Difficulty walking, Increased edema, Impaired flexibility  Visit Diagnosis: Stiffness of right knee, not elsewhere classified  Localized edema  Unsteadiness on feet  Difficulty in walking, not elsewhere classified     Problem List Patient Active Problem List   Diagnosis Date Noted  . Pulmonary nodule 07/19/2017  . Acute deep vein thrombosis (DVT) of right lower extremity ([place])   . Heme + stool 07/15/2017  . Constipation 07/15/2017  . Melena 07/15/2017  . Acute blood loss anemia 07/15/2017  . Postoperative anemia 06/25/2017  . Pulmonary embolism ([place]) 06/18/2017  . Primary localized osteoarthritis of right knee 06/10/2017  . Upper airway cough syndrome 08/29/2015  . Prostate cancer ([place]) 08/17/2015  . Exposure to asbestos 08/17/2015  . Personal history of colonic polyps 08/08/2014  . Joint pain 06/05/2014  . Vitamin D deficiency 06/05/2014  . BPH (benign prostatic hypertrophy) 06/01/2013  . Mild hyperlipidemia 06/01/2013  . Routine general medical examination at a health care facility 06/01/2013      G[place]ine Solar PT, [place] [address] [place], [place], [address] Phone: [phone]   Fax:  [phone]  Name: Luis Stewart MRN: [id number] Date of Birth: [birth year]

## 2017-08-21 NOTE — Patient Instructions (Signed)
  SL RDL (Single Leg [place] Deadlift)  1) Begin in tall standing position with a slight bend in both knees 2) Engage your core by lightly bringing belly button closer to the spine to maintain neutral lumbar spine 3) Next shift body weight to be standing on one leg only 4) Begin to hinge at the hips while you maintain a neutral spine. The first movement should be backwards as if someone were pulling your weight/hips backwards.  5) Once you feel stretch in the hamstrings, begin to drive through your heels to bring your hips forward and shoulders back to starting position.  6) If you are using weight, keep the weight close to your body and in the opposite hand of your stance leg. Your shins should stay vertical and the knees should maintain the same amount of bend.  7) Complete for assigned repetitions 8) It is important to not allow the lumbar spine to hyperextend or flex, it should maintain neutral position!

## 2017-08-25 ENCOUNTER — Ambulatory Visit (HOSPITAL_COMMUNITY): Payer: Medicare Other | Attending: Orthopedic Surgery | Admitting: Physical Therapy

## 2017-08-25 DIAGNOSIS — R262 Difficulty in walking, not elsewhere classified: Secondary | ICD-10-CM

## 2017-08-25 DIAGNOSIS — M25661 Stiffness of right knee, not elsewhere classified: Secondary | ICD-10-CM | POA: Insufficient documentation

## 2017-08-25 DIAGNOSIS — R2681 Unsteadiness on feet: Secondary | ICD-10-CM

## 2017-08-25 DIAGNOSIS — R600 Localized edema: Secondary | ICD-10-CM | POA: Diagnosis not present

## 2017-08-25 NOTE — Therapy (Signed)
[place], [place], [address] Phone: [phone]   Fax:  [phone]  Physical Therapy Treatment (Discharge)  Patient Details  Name: Luis Stewart MRN: [id number] Date of Birth: 1951-07-02 Referring Provider: Kathryne Hitch   Encounter Date: 08/25/2017      PT End of Session - 08/25/17 0846    Visit Number 22   Number of Visits 22   Date for PT Re-Evaluation 40/76/80  recert done 9/5   Authorization Type Medicare primary, BCBS supplementary (G-codes done 03-07-23 visit)   Authorization Time Period 07/29/10 to 0/31/59; recert done 4/58; recert done 9/5   Authorization - Visit Number 46   Authorization - Number of Visits 71   PT Start Time 0818   PT Stop Time 5929  DC, no further need for skilled PT services    PT Time Calculation (min) 23 min   Activity Tolerance Patient tolerated treatment well   Behavior During Therapy [facility] for tasks assessed/performed      Past Medical History:  Diagnosis Date  . Arthritis   . Asbestosis ([place])   . Cyst of kidney, acquired   . Headache   . History of fracture of leg    Right, childhood  . Hx of migraine headaches   . Hyperlipidemia   . Neuropathy   . PE (pulmonary thromboembolism) ([place])    After knee surgery  . Prostate cancer ([place]) 2015   Adenocarcinoma by biopsy    Past Surgical History:  Procedure Laterality Date  . BIOPSY PROSTATE  2003 and 2005  . COLONOSCOPY  11/28/2008   Dr. Rourk:internal hemorrhoids/tortus colon/ascending colon polyps, tubular adenomas  . COLONOSCOPY N/A 09/04/2014   Procedure: COLONOSCOPY;  Surgeon: Daneil Dolin, MD;  [facility];  Service: Endoscopy;  Laterality: N/A;  9:30  . COLONOSCOPY N/A 07/17/2017   Procedure: COLONOSCOPY;  Surgeon: Danie Binder, MD;  [facility];  Service: Endoscopy;  Laterality: N/A;  . ESOPHAGOGASTRODUODENOSCOPY N/A 07/16/2017   Procedure: ESOPHAGOGASTRODUODENOSCOPY (EGD);  Surgeon:  Danie Binder, MD;  [facility];  Service: Endoscopy;  Laterality: N/A;  . GIVENS CAPSULE STUDY  07/16/2017   Procedure: GIVENS CAPSULE STUDY;  Surgeon: Danie Binder, MD;  [facility];  Service: Endoscopy;;  . POLYPECTOMY  07/17/2017   Procedure: POLYPECTOMY;  Surgeon: Danie Binder, MD;  [facility];  Service: Endoscopy;;  cecal  . TOTAL KNEE ARTHROPLASTY Right 06/10/2017  . TOTAL KNEE ARTHROPLASTY Right 06/10/2017   Procedure: TOTAL KNEE ARTHROPLASTY;  Surgeon: [name], MD;  Location: [place];  Service: Orthopedics;  Laterality: Right;    There were no vitals filed for this visit.      Subjective Assessment - 08/25/17 0819    Subjective Patient arrives stating he is doing good, just had some aches and pains and intermittent numbness/tingling. He remains unconfident regarding going back to activities such as bowling. He rates himself as being about 80% on a subjective scale with main remaining impairment being stiffness.    Pertinent History xarelto, history of PE, history of prostate cancer (just monitoring now), neuropathy    How long can you sit comfortably? 9/4- unlimited    How long can you stand comfortably? 9/4- unlimited    How long can you walk comfortably? 9/4- unlimited    Patient Stated Goals get back to normal ASAP    Currently in Pain? No/denies  [facility] PT Assessment - 08/25/17 0001      Assessment   Medical Diagnosis R TKR    Referring Provider Kathryne Hitch    Onset Date/Surgical Date 06/10/17   Next MD Visit Dr. Percell Miller September 11th    Prior Therapy HHPT for TKR, [facility] been [facility]      Precautions   Precautions Fall;Other (comment)   Precaution Comments active xarelto      Restrictions   Weight Bearing Restrictions No     Balance Screen   Has the patient fallen in the past 6 months No   Has the patient had a decrease in activity level because of a fear of falling?  No   Is the patient reluctant to  leave their home because of a fear of falling?  No     Prior Function   Level of Independence Independent;Independent with basic ADLs;Independent with gait;Independent with transfers   Vocation Retired     AROM   Right Knee Extension 1   Right Knee Flexion 105     Strength   Right Hip Flexion 5/5   Right Hip Extension 4+/5   Right Hip ABduction 4+/5   Left Hip Flexion 5/5   Left Hip Extension 4-/5   Left Hip ABduction 5/5   Right Knee Flexion 5/5   Right Knee Extension 5/5   Left Knee Flexion 5/5   Left Knee Extension 5/5   Right Ankle Dorsiflexion 5/5   Left Ankle Dorsiflexion 5/5     6 minute walk test results    Aerobic Endurance Distance Walked 889   Endurance additional comments 3MWT no device      High Level Balance   High Level Balance Comments TUG 5.38 no device                              PT Education - 08/25/17 0845    Education provided Yes   Education Details progress with skilled PT services, activity and HEP recommndatiosn moving forward, DC tdoay    Person(s) Educated Patient   Methods Explanation;Handout   Comprehension Verbalized understanding          PT Short Term Goals - 08/25/17 0835      PT SHORT TERM GOAL #1   Title Patient to demonstrate R knee ROM as being 0-115 degrees in order to improve mechanics and assist in return to PLOF    Baseline 9/4- at best 0-113 but today 1-105    Time 3   Period Weeks   Status Partially Met     PT SHORT TERM GOAL #2   Title Patient to be ambulating with no assistive device, consistent heel toe pattern, no unsteadiness to improve mobiilty and assist in return to PLOF    Baseline 9/4- achieved    Time 3   Period Weeks   Status Achieved     PT SHORT TERM GOAL #3   Title patient to be independent in correct performance of scar mobilization, patella mobility, and edema control in order to improve self-efficacy in managing condition    Baseline 9/4- compliant    Time 3   Period  Weeks   Status Achieved     PT SHORT TERM GOAL #4   Title Patient to be compliant with correct performance of HEP, to be updated PRN    Baseline 9/4- compliant    Time 1   Period Weeks   Status Achieved  PT Long Term Goals - 28-Aug-2017 0836      PT LONG TERM GOAL #1   Title Patient to demonstrate functional strength as being 5/5 in all tested muscle groups in order to improve giat and balance    Baseline 08-29-23- some mild weakness in hips but mostly 5/5    Time 6   Period Weeks   Status Achieved     PT LONG TERM GOAL #2   Title Patient to be able to complete TUG test in 10 seconds with no AD in order to show improved balance/reduced fall risk    Baseline 08/29/2023- 5.32   Time 6   Period Weeks   Status Achieved     PT LONG TERM GOAL #3   Title Patient to be able to perform mock-bowling exercise in clinic with no LOB and good form in order to ensure safety and success with individual attempt to return to bowling based tasks    Baseline Aug 29, 2023- able to perform reasonably safely, appears limited by confidence rather than true impairment    Time 6   Period Weeks   Status Partially Met     PT LONG TERM GOAL #4   Title Patient to be able to ascend/descent full flight of stairs with U railing, no AD, reciprocal pattern, good eccentric control in order to show improved community and home access    Baseline Aug 29, 2023- no railing, form WNL    Time 6   Period Weeks   Status Achieved               Plan - 08-28-2017 0847    Clinical Impression Statement Re-assessment performed today. Patient is doing very well with skilled PT services and has met or partially met the majority of his goals, with main remaining concern being ongoing mild difficulties in R knee flexion ROM. However, note that he is able to perform all functional tasks and activities without difficulty or deficit at this time. Patient appears ready for transition to independent program- DC to home program at this point.     Rehab Potential Good   Clinical Impairments Affecting Rehab Potential (+) motivated to participate in PT/return to PLOF, minimal PMH   PT Next Visit Plan DC today    PT Home Exercise Plan Eval: quad sets, heel slides, lateral weight shfits, seated knee flexion stretch; 7/18 prone quad stretch; 8/31: L single leg RDLs   Consulted and Agree with Plan of Care Patient      Patient will benefit from skilled therapeutic intervention in order to improve the following deficits and impairments:  Abnormal gait, Decreased skin integrity, Pain, Decreased mobility, Decreased scar mobility, Decreased activity tolerance, Decreased range of motion, Decreased strength, Hypomobility, Decreased balance, Difficulty walking, Increased edema, Impaired flexibility  Visit Diagnosis: Stiffness of right knee, not elsewhere classified - Plan: PT plan of care cert/re-cert  Localized edema - Plan: PT plan of care cert/re-cert  Unsteadiness on feet - Plan: PT plan of care cert/re-cert  Difficulty in walking, not elsewhere classified - Plan: PT plan of care cert/re-cert       G-Codes - 2017-08-28 0847    Functional Assessment Tool Used (Outpatient Only) Baed on skilled clinical assessment of ROM, strength, gait, balance    Functional Limitation Mobility: Walking and moving around   Mobility: Walking and Moving Around Goal Status [phone]) At least 1 percent but less than 20 percent impaired, limited or restricted   Mobility: Walking and Moving Around Discharge Status [phone]) At least 1  percent but less than 20 percent impaired, limited or restricted      Problem List Patient Active Problem List   Diagnosis Date Noted  . Pulmonary nodule 07/19/2017  . Acute deep vein thrombosis (DVT) of right lower extremity ([place])   . Heme + stool 07/15/2017  . Constipation 07/15/2017  . Melena 07/15/2017  . Acute blood loss anemia 07/15/2017  . Postoperative anemia 06/25/2017  . Pulmonary embolism ([place]) 06/18/2017  . Primary  localized osteoarthritis of right knee 06/10/2017  . Upper airway cough syndrome 08/29/2015  . Prostate cancer ([place]) 08/17/2015  . Exposure to asbestos 08/17/2015  . Personal history of colonic polyps 08/08/2014  . Joint pain 06/05/2014  . Vitamin D deficiency 06/05/2014  . BPH (benign prostatic hypertrophy) 06/01/2013  . Mild hyperlipidemia 06/01/2013  . Routine general medical examination at a health care facility 06/01/2013    PHYSICAL THERAPY DISCHARGE SUMMARY  Visits from Start of Care: 22  Current functional level related to goals / functional outcomes: Patient being discharged due to high level of progress and function- see above.    Remaining deficits: Mild R knee flexion ROM deficits    Education / Equipment: See above  Plan: Patient agrees to discharge.  Patient goals were met. Patient is being discharged due to meeting the stated rehab goals.  ?????       Deniece Ree PT, DPT [place] [address] [place], [place], [address] Phone: [phone]   Fax:  [phone]  Name: Luis Stewart MRN: [id number] Date of Birth: 02/10/1951

## 2017-08-26 ENCOUNTER — Encounter (HOSPITAL_COMMUNITY): Payer: Self-pay

## 2017-08-28 ENCOUNTER — Encounter (HOSPITAL_COMMUNITY): Payer: Self-pay

## 2017-09-01 DIAGNOSIS — M1711 Unilateral primary osteoarthritis, right knee: Secondary | ICD-10-CM | POA: Diagnosis not present

## 2017-09-09 ENCOUNTER — Ambulatory Visit: Payer: Medicare Other | Admitting: Family Medicine

## 2017-09-18 ENCOUNTER — Ambulatory Visit (INDEPENDENT_AMBULATORY_CARE_PROVIDER_SITE_OTHER): Payer: Medicare Other | Admitting: Family Medicine

## 2017-09-18 ENCOUNTER — Encounter: Payer: Self-pay | Admitting: Family Medicine

## 2017-09-18 VITALS — BP 122/64 | HR 68 | Temp 98.4°F | Resp 14 | Ht 74.0 in | Wt 179.8 lb

## 2017-09-18 DIAGNOSIS — I2699 Other pulmonary embolism without acute cor pulmonale: Secondary | ICD-10-CM | POA: Diagnosis not present

## 2017-09-18 DIAGNOSIS — Z23 Encounter for immunization: Secondary | ICD-10-CM | POA: Diagnosis not present

## 2017-09-18 DIAGNOSIS — D649 Anemia, unspecified: Secondary | ICD-10-CM | POA: Diagnosis not present

## 2017-09-18 LAB — CBC WITH DIFFERENTIAL/PLATELET
BASOS ABS: 21 {cells}/uL (ref 0–200)
Basophils Relative: 0.5 %
EOS ABS: 131 {cells}/uL (ref 15–500)
EOS PCT: 3.2 %
HEMATOCRIT: 36.3 % — AB (ref 38.5–50.0)
Hemoglobin: 11.8 g/dL — ABNORMAL LOW (ref 13.2–17.1)
LYMPHS ABS: 1152 {cells}/uL (ref 850–3900)
MCH: 27.3 pg (ref 27.0–33.0)
MCHC: 32.5 g/dL (ref 32.0–36.0)
MCV: 83.8 fL (ref 80.0–100.0)
MONOS PCT: 7.4 %
MPV: 11.7 fL (ref 7.5–12.5)
NEUTROS PCT: 60.8 %
Neutro Abs: 2493 cells/uL (ref 1500–7800)
Platelets: 232 10*3/uL (ref 140–400)
RBC: 4.33 10*6/uL (ref 4.20–5.80)
RDW: 14.4 % (ref 11.0–15.0)
Total Lymphocyte: 28.1 %
WBC mixed population: 303 cells/uL (ref 200–950)
WBC: 4.1 10*3/uL (ref 3.8–10.8)

## 2017-09-18 NOTE — Progress Notes (Signed)
   Subjective:    Patient ID: Luis Stewart, male    DOB: 06/03/1951, 66 y.o.   MRN: [id number]  Patient presents for routine follow up   Pt here to f/u pulmonary embolism. Currently on Eliquis for past 3 months due to PE after knee Replacement surgery. He did have GI bleed on Xarelto was admitted back July, he switched to Eliquis and protonix. Plan was to stop anti-coaglation after 3 months  He has done well he has not had any further bleeding his appetite is improved he is actually gaining some weight. He feels comfortable with these the compression hose typically wears on the day and would like to continue using these. No new concerns  Review Of Systems:  GEN- denies fatigue, fever, weight loss,weakness, recent illness HEENT- denies eye drainage, change in vision, nasal discharge, CVS- denies chest pain, palpitations RESP- denies SOB, cough, wheeze ABD- denies N/V, change in stools, abd pain GU- denies dysuria, hematuria, dribbling, incontinence MSK- denies joint pain, muscle aches, injury Neuro- denies headache, dizziness, syncope, seizure activity       Objective:    BP 122/64   Pulse 68   Temp 98.4 F (36.9 C) (Oral)   Resp 14   Ht 6\' 2"  (1.88 m)   Wt 179 lb 12.8 oz (81.6 kg)   SpO2 98%   BMI 23.08 kg/m  GEN- NAD, alert and oriented x3 HEENT- PERRL, EOMI, non injected sclera, pink conjunctiva, MMM, oropharynx clear Neck- Supple, no JVD CVS- RRR, no murmur RESP-CTAB ABD-NABS,soft,NT,ND EXT- No edema Pulses- Radiall 2+        Assessment & Plan:      Problem List Items Addressed This Visit      Unprioritized   Postoperative anemia   Relevant Orders   CBC with Differential/Platelet   Pulmonary embolism (HCC) - Primary    Pulmonary embolus as a result of his knee replacement surgery. He's completed 3 months of treatment today he will take his last visit this evening of his eliquis and then stop. I will recheck his hemoglobin [place] had postoperative anemia  and GI bleed. He will follow-up with gastroenterology next month hopefully his protonix dose can be decreased at that time.      Relevant Orders   CBC with Differential/Platelet      Note: This dictation was prepared with Dragon dictation along with smaller phrase technology. Any transcriptional errors that result from this process are unintentional.

## 2017-09-18 NOTE — Patient Instructions (Signed)
Stop the eliquis tomorrow We will call with hemoglobin  Flu shot given  F/U as previous

## 2017-09-18 NOTE — Assessment & Plan Note (Signed)
Pulmonary embolus as a result of his knee replacement surgery. He's completed 3 months of treatment today he will take his last visit this evening of his eliquis and then stop. I will recheck his hemoglobin [place] had postoperative anemia and GI bleed. He will follow-up with gastroenterology next month hopefully his protonix dose can be decreased at that time.

## 2017-10-06 ENCOUNTER — Emergency Department (HOSPITAL_COMMUNITY): Payer: Medicare Other

## 2017-10-06 ENCOUNTER — Encounter (HOSPITAL_COMMUNITY): Payer: Self-pay | Admitting: Emergency Medicine

## 2017-10-06 ENCOUNTER — Emergency Department (HOSPITAL_COMMUNITY)
Admission: EM | Admit: 2017-10-06 | Discharge: 2017-10-06 | Disposition: A | Discharge status: Home / Self Care | Payer: Medicare Other | Attending: Emergency Medicine | Admitting: Emergency Medicine

## 2017-10-06 DIAGNOSIS — Z96651 Presence of right artificial knee joint: Secondary | ICD-10-CM | POA: Diagnosis not present

## 2017-10-06 DIAGNOSIS — R0781 Pleurodynia: Secondary | ICD-10-CM | POA: Diagnosis not present

## 2017-10-06 DIAGNOSIS — R079 Chest pain, unspecified: Secondary | ICD-10-CM

## 2017-10-06 DIAGNOSIS — R0789 Other chest pain: Secondary | ICD-10-CM | POA: Diagnosis not present

## 2017-10-06 DIAGNOSIS — Z86711 Personal history of pulmonary embolism: Secondary | ICD-10-CM | POA: Diagnosis not present

## 2017-10-06 LAB — COMPREHENSIVE METABOLIC PANEL
ALBUMIN: 3.5 g/dL (ref 3.5–5.0)
ALK PHOS: 105 U/L (ref 38–126)
ALT: 15 U/L — ABNORMAL LOW (ref 17–63)
ANION GAP: 8 (ref 5–15)
AST: 20 U/L (ref 15–41)
BUN: 17 mg/dL (ref 6–20)
CALCIUM: 9 mg/dL (ref 8.9–10.3)
CHLORIDE: 103 mmol/L (ref 101–111)
CO2: 29 mmol/L (ref 22–32)
Creatinine, Ser: 1.31 mg/dL — ABNORMAL HIGH (ref 0.61–1.24)
GFR calc non Af Amer: 55 mL/min — ABNORMAL LOW (ref >60)
GLUCOSE: 123 mg/dL — AB (ref 65–99)
POTASSIUM: 3.8 mmol/L (ref 3.5–5.1)
SODIUM: 140 mmol/L (ref 135–145)
Total Bilirubin: 0.4 mg/dL (ref 0.3–1.2)
Total Protein: 6.8 g/dL (ref 6.5–8.1)

## 2017-10-06 LAB — CBC
HCT: 36.8 % — ABNORMAL LOW (ref 39.0–52.0)
HEMOGLOBIN: 11.9 g/dL — AB (ref 13.0–17.0)
MCH: 26.9 pg (ref 26.0–34.0)
MCHC: 32.3 g/dL (ref 30.0–36.0)
MCV: 83.1 fL (ref 78.0–100.0)
PLATELETS: 180 10*3/uL (ref 150–400)
RBC: 4.43 MIL/uL (ref 4.22–5.81)
RDW: 14.9 % (ref 11.5–15.5)
WBC: 3.6 10*3/uL — AB (ref 4.0–10.5)

## 2017-10-06 LAB — I-STAT TROPONIN, ED
TROPONIN I, POC: 0.01 ng/mL (ref 0.00–0.08)
Troponin i, poc: 0.01 ng/mL (ref 0.00–0.08)

## 2017-10-06 MED ORDER — IOPAMIDOL (ISOVUE-370) INJECTION 76%
100.0000 mL | Freq: Once | INTRAVENOUS | Status: AC | PRN
  Administered 2017-10-06: 100 mL via INTRAVENOUS

## 2017-10-06 NOTE — ED Triage Notes (Signed)
Pt states that he woke up this morning and he began experiencing pain upper left chest that worsens when he takes a breath.  Pt states that he called his PCP and was instructed to come to the ED.  Pt states that he is experiencing chest pressure.

## 2017-10-06 NOTE — ED Notes (Signed)
Pt returned from CT °

## 2017-10-06 NOTE — ED Provider Notes (Signed)
Medical screening examination/treatment/procedure(s) were conducted as a shared visit with non-physician practitioner(s) and myself.  I personally evaluated the patient during the encounter.  History of pulmonary embolus after a knee surgery. Just stopped his Eliquis a couple weeks ago. He woke up this morning with sharp left-sided chest pain similar to previous episode of PE but also similar to a negative coronary workup a few years ago. No other associated symptoms. On my exam he does not have any evidence of trauma and no tenderness to palpation. His vital signs are within normal limits. Plan for CT scan, delta troponin, serial ECGs and likely PCP follow-up for repeat stress test if all is negative.   EKG Interpretation  Date/Time:  Tuesday October 06 2017 10:09:40 EDT Ventricular Rate:  64 PR Interval:    QRS Duration: 99 QT Interval:  384 QTC Calculation: 397 R Axis:   31 Text Interpretation:  Sinus rhythm Probable left atrial enlargement RSR' in V1 or V2, right VCD or RVH Minimal ST elevation, anterior leads No significant change since last tracing in July Confirmed by Merrily Pew [phone]) on 10/06/2017 10:25:24 AM         Vedant Shehadeh, Corene Cornea, MD 10/06/17 [phone]

## 2017-10-06 NOTE — Discharge Instructions (Signed)
Schedule to see your Physician for recheck.  Return if any problems.  

## 2017-10-06 NOTE — ED Provider Notes (Signed)
[facility] EMERGENCY DEPARTMENT Provider Note   CSN: [id number] Arrival date & time: 10/06/17  2202     History   Chief Complaint Chief Complaint  Patient presents with  . Chest Pain    HPI Luis Stewart is a 66 y.o. male.  The history is provided by the patient. No language interpreter was used.  Chest Pain   This is a new problem. The current episode started 3 to 5 hours ago. The problem occurs constantly. The problem has not changed since onset.The pain is associated with breathing. The pain is present in the lateral region. The pain is moderate. The quality of the pain is described as sharp. The pain does not radiate. The symptoms are aggravated by deep breathing. Pertinent negatives include no abdominal pain. He has tried nothing for the symptoms. The treatment provided no relief. Risk factors include male gender.  His past medical history is significant for PE.  Procedure history is negative for exercise treadmill test.  Pt reports he has had pain on the left side of his chest.  Pt had a pulmonary emblus in June.  Past Medical History:  Diagnosis Date  . Arthritis   . Asbestosis ([place])   . Cyst of kidney, acquired   . Headache   . History of fracture of leg    Right, childhood  . Hx of migraine headaches   . Hyperlipidemia   . Neuropathy   . PE (pulmonary thromboembolism) ([place])    After knee surgery  . Prostate cancer ([place]) 2015   Adenocarcinoma by biopsy    Patient Active Problem List   Diagnosis Date Noted  . Pulmonary nodule 07/19/2017  . Acute deep vein thrombosis (DVT) of right lower extremity ([place])   . Heme + stool 07/15/2017  . Constipation 07/15/2017  . Melena 07/15/2017  . Acute blood loss anemia 07/15/2017  . Postoperative anemia 06/25/2017  . Pulmonary embolism ([place]) 06/18/2017  . Primary localized osteoarthritis of right knee 06/10/2017  . Upper airway cough syndrome 08/29/2015  . Prostate cancer ([place]) 08/17/2015  . Exposure to asbestos  08/17/2015  . Personal history of colonic polyps 08/08/2014  . Joint pain 06/05/2014  . Vitamin D deficiency 06/05/2014  . BPH (benign prostatic hypertrophy) 06/01/2013  . Mild hyperlipidemia 06/01/2013  . Routine general medical examination at a health care facility 06/01/2013    Past Surgical History:  Procedure Laterality Date  . BIOPSY PROSTATE  2003 and 2005  . COLONOSCOPY  11/28/2008   Dr. Rourk:internal hemorrhoids/tortus colon/ascending colon polyps, tubular adenomas  . COLONOSCOPY N/A 09/04/2014   Procedure: COLONOSCOPY;  Surgeon: Daneil Dolin, MD;  [facility];  Service: Endoscopy;  Laterality: N/A;  9:30  . COLONOSCOPY N/A 07/17/2017   Procedure: COLONOSCOPY;  Surgeon: Danie Binder, MD;  [facility];  Service: Endoscopy;  Laterality: N/A;  . ESOPHAGOGASTRODUODENOSCOPY N/A 07/16/2017   Procedure: ESOPHAGOGASTRODUODENOSCOPY (EGD);  Surgeon: Danie Binder, MD;  [facility];  Service: Endoscopy;  Laterality: N/A;  . GIVENS CAPSULE STUDY  07/16/2017   Procedure: GIVENS CAPSULE STUDY;  Surgeon: Danie Binder, MD;  [facility];  Service: Endoscopy;;  . POLYPECTOMY  07/17/2017   Procedure: POLYPECTOMY;  Surgeon: Danie Binder, MD;  [facility];  Service: Endoscopy;;  cecal  . TOTAL KNEE ARTHROPLASTY Right 06/10/2017  . TOTAL KNEE ARTHROPLASTY Right 06/10/2017   Procedure: TOTAL KNEE ARTHROPLASTY;  Surgeon: [name], MD;  Location: [facility];  Service: Orthopedics;  Laterality: Right;       Home Medications    Prior to Admission medications   Medication Sig Start Date End Date Taking? Authorizing Provider  acetaminophen (TYLENOL) 500 MG tablet Take 1,000 mg by mouth every 6 (six) hours as needed for mild pain or moderate pain.   Yes [provider]  Cholecalciferol (VITAMIN D) 2000 units CAPS Take 2,000 Units by mouth 3 (three) times a week.    Yes [provider]  Multiple Vitamin  (MULTIVITAMIN WITH MINERALS) TABS tablet Take 1 tablet by mouth daily. Centrum Silver for Men   Yes [provider]  pantoprazole (PROTONIX) 40 MG tablet Take 1 tablet (40 mg total) by mouth 2 (two) times daily before a meal. 07/21/17  Yes Carlis Stable, NP  apixaban (ELIQUIS) 5 MG TABS tablet Take 1 tablet (5 mg total) by mouth 2 (two) times daily. Patient not taking: Reported on 10/06/2017 08/19/17   Alycia Rossetti, MD    Family History Family History  Problem Relation Age of Onset  . Diabetes Mother   . Hypertension Mother   . Obesity Mother   . Heart disease Mother   . Mental illness Mother   . Hypertension Sister   . Obesity Sister   . Arthritis Sister   . Deep vein thrombosis Father   . Dementia Father   . Heart disease Father        CABG  . Colon cancer Neg Hx     Social History Social History  Substance Use Topics  . Smoking status: Never Smoker  . Smokeless tobacco: Never Used  . Alcohol use No     Allergies   Sulfa antibiotics   Review of Systems Review of Systems  Cardiovascular: Positive for chest pain.  Gastrointestinal: Negative for abdominal pain.  All other systems reviewed and are negative.    Physical Exam Updated Vital Signs BP 128/87   Pulse (!) 58   Temp 98.7 F (37.1 C) (Oral)   Resp 15   Ht 6\' 2"  (1.88 m)   Wt 81.2 kg (179 lb)   SpO2 100%   BMI 22.98 kg/m   Physical Exam  Constitutional: He appears well-developed and well-nourished.  HENT:  Head: Normocephalic and atraumatic.  Eyes: Conjunctivae are normal.  Neck: Neck supple.  Cardiovascular: Normal rate and regular rhythm.   No murmur heard. Pulmonary/Chest: Effort normal and breath sounds normal. No respiratory distress.  Abdominal: Soft. There is no tenderness.  Musculoskeletal: He exhibits no edema.  Neurological: He is alert.  Skin: Skin is warm and dry.  Psychiatric: He has a normal mood and affect.  Nursing note and vitals reviewed.    ED Treatments /  Results  Labs (all labs ordered are listed, but only abnormal results are displayed) Labs Reviewed  CBC - Abnormal; Notable for the following:       Result Value   WBC 3.6 (*)    Hemoglobin 11.9 (*)    HCT 36.8 (*)    All other components within normal limits  COMPREHENSIVE METABOLIC PANEL - Abnormal; Notable for the following:    Glucose, Bld 123 (*)    Creatinine, Ser 1.31 (*)    ALT 15 (*)    GFR calc non Af Amer 55 (*)    All other components within normal limits  I-STAT TROPONIN, ED  I-STAT TROPONIN, ED  I-STAT TROPONIN, ED    EKG  EKG Interpretation  Date/Time:  Tuesday October 06 2017  10:09:40 EDT Ventricular Rate:  64 PR Interval:    QRS Duration: 99 QT Interval:  384 QTC Calculation: 397 R Axis:   31 Text Interpretation:  Sinus rhythm Probable left atrial enlargement RSR' in V1 or V2, right VCD or RVH Minimal ST elevation, anterior leads No significant change since last tracing in July Confirmed by Merrily Pew [phone]) on 10/06/2017 10:25:24 AM       Radiology Ct Angio Chest Pe W And/or Wo Contrast  Result Date: 10/06/2017 CLINICAL DATA:  Pleuritic chest pain. History of prior pulmonary embolism. EXAM: CT ANGIOGRAPHY CHEST WITH CONTRAST TECHNIQUE: Multidetector CT imaging of the chest was performed using the standard protocol during bolus administration of intravenous contrast. Multiplanar CT image reconstructions and MIPs were obtained to evaluate the vascular anatomy. CONTRAST:  100 cc Isovue 370 intravenous contrast. COMPARISON:  Whole CTA chest dated June 17, 2017. FINDINGS: Cardiovascular: Satisfactory opacification of the pulmonary arteries to the segmental level. No evidence of pulmonary embolism. Previously seen right-sided segmental pulmonary emboli have resolved. Normal heart size. No pericardial effusion. Normal caliber thoracic aorta. Mediastinum/Nodes: No enlarged mediastinal, hilar, or axillary lymph nodes. Thyroid gland, trachea, and esophagus  demonstrate no significant findings. Lungs/Pleura: Lungs are clear. No pleural effusion or pneumothorax. Upper Abdomen: No acute abnormality. Innumerable hepatic cysts are again seen. Musculoskeletal: No chest wall abnormality. No acute or significant osseous findings. Review of the MIP images confirms the above findings. IMPRESSION: 1. No evidence of pulmonary embolism. Previously seen right-sided segmental pulmonary emboli have resolved. No acute intrathoracic process. Electronically Signed   By: Titus Dubin M.D.   On: 10/06/2017 12:04    Procedures Procedures (including critical care time)  Medications Ordered in ED Medications  iopamidol (ISOVUE-370) 76 % injection 100 mL (100 mLs Intravenous Contrast Given 10/06/17 1144)     Initial Impression / Assessment and Plan / ED Course  I have reviewed the triage vital signs and the nursing notes.  Pertinent labs & imaging results that were available during my care of the patient were reviewed by me and considered in my medical decision making (see chart for details).    CT angio chest negative,  Troponin negative x 2.  EKg no acute abnormality,  I advised pt to follow up with Cardiology for further evaluation.  Pt advised to take an asa a day.     Final Clinical Impressions(s) / ED Diagnoses   Final diagnoses:  Chest pain, unspecified type  Atypical chest pain    New Prescriptions Discharge Medication List as of 10/06/2017  2:27 PM    An After Visit Summary was printed and given to the patient.    Fransico Meadow, [place] 10/06/17 1613    Merrily Pew, MD 10/06/17 [phone]

## 2017-10-07 ENCOUNTER — Ambulatory Visit (INDEPENDENT_AMBULATORY_CARE_PROVIDER_SITE_OTHER): Payer: Medicare Other | Admitting: Family Medicine

## 2017-10-07 ENCOUNTER — Encounter: Payer: Self-pay | Admitting: Family Medicine

## 2017-10-07 VITALS — BP 128/78 | HR 70 | Temp 98.0°F | Resp 16 | Ht 74.0 in | Wt 176.0 lb

## 2017-10-07 DIAGNOSIS — N289 Disorder of kidney and ureter, unspecified: Secondary | ICD-10-CM

## 2017-10-07 DIAGNOSIS — R079 Chest pain, unspecified: Secondary | ICD-10-CM

## 2017-10-07 LAB — BASIC METABOLIC PANEL
BUN: 15 mg/dL (ref 7–25)
CALCIUM: 9.5 mg/dL (ref 8.6–10.3)
CO2: 30 mmol/L (ref 20–32)
Chloride: 104 mmol/L (ref 98–110)
Creat: 1.25 mg/dL (ref 0.70–1.25)
Glucose, Bld: 91 mg/dL (ref 65–99)
POTASSIUM: 4.5 mmol/L (ref 3.5–5.3)
Sodium: 141 mmol/L (ref 135–146)

## 2017-10-07 NOTE — Progress Notes (Signed)
   Subjective:    Patient ID: Luis Stewart, male    DOB: 02/15/1951, 66 y.o.   MRN: [id number]  Patient presents for ER F/U (chest pain)   Pt here with left sided chest pain. He was in normal state of health Monday, even went bowling Monday night. Tuesday AM woke up with chest pressure, that radiated to his back and pain with deep breathing. He went to ER, Troponin neg, non specific changes on EKG, CTA negative (recent PE/DVT). No meds given. Pain started to ease off Tuesday evening, he did take ASA 81mg . He denies any GI symptoms, but admits did not take protonix yesterday.  Denies any injury, denies shoulder pain. He can not press on chest wall to activate the pain. This AM pain is minimal  but he can still feel it a little when he takes a deep breath.  No change with exertion   No cough/URI symptoms  Reviewed ER notes/Imaging   Creatinine was up to 1.31, recheck today  , previous 0.99 in July   Review Of Systems:  GEN- denies fatigue, fever, weight loss,weakness, recent illness HEENT- denies eye drainage, change in vision, nasal discharge, CVS- +chest pain, palpitations RESP- denies SOB, cough, wheeze ABD- denies N/V, change in stools, abd pain GU- denies dysuria, hematuria, dribbling, incontinence MSK- denies joint pain, muscle aches, injury Neuro- denies headache, dizziness, syncope, seizure activity       Objective:    BP 128/78   Pulse 70   Temp 98 F (36.7 C) (Oral)   Resp 16   Ht 6\' 2"  (1.88 m)   Wt 176 lb (79.8 kg)   SpO2 96%   BMI 22.60 kg/m  GEN- NAD, alert and oriented x3 HEENT- PERRL, EOMI, non injected sclera, pink conjunctiva, MMM, oropharynx clear CVS- RRR, no murmur, chest wall NT  RESP-CTAB ABD-NABS,soft,NT,ND MSK- good ROM upper ext EXT- No edema Pulses- Radial, DP- 2+   EKG- NSR, mild bradycardia 57, no ST changes, compared to EKG yesterday       Assessment & Plan:      Problem List Items Addressed This Visit    None    Visit  Diagnoses    Chest pain, unspecified type    -  Primary   ? more MSK pain, atypical for angina and  he was bowling night before, no exertional component, CTA neg, no infection, no PE, no AAA. EKG reassuring. He had stress test and  Echo 2016 He had a GI bleed recently while he was on his blood thinner therefore I do not recommend that he take the aspin at this time he is follow-up with GI next week to get cleared he is on Protonix twice a day because of the GI bleed. He can use acetaminophen or try heating pad for muscular pain. If for some raising the pain comes back so worsens and I will get him set up with cardiology. He does have some family history of coronary artery disease but no early myocardial infarctions and he honestly does not have any significant risk factors for heart disease. His blood pressures also controlled.   Relevant Orders   EKG 12-Lead (Completed)   Acute renal insufficiency       Recheck renal function   Relevant Orders   Basic metabolic panel      Note: This dictation was prepared with Dragon dictation along with smaller phrase technology. Any transcriptional errors that result from this process are unintentional.

## 2017-10-07 NOTE — Patient Instructions (Signed)
F/U As previous Call if chest pain returns

## 2017-10-12 ENCOUNTER — Other Ambulatory Visit: Payer: Self-pay | Admitting: *Deleted

## 2017-10-12 DIAGNOSIS — R0789 Other chest pain: Secondary | ICD-10-CM

## 2017-10-14 ENCOUNTER — Other Ambulatory Visit: Payer: Self-pay

## 2017-10-14 DIAGNOSIS — D500 Iron deficiency anemia secondary to blood loss (chronic): Secondary | ICD-10-CM

## 2017-10-15 ENCOUNTER — Ambulatory Visit (INDEPENDENT_AMBULATORY_CARE_PROVIDER_SITE_OTHER): Payer: Medicare Other | Admitting: Nurse Practitioner

## 2017-10-15 ENCOUNTER — Encounter: Payer: Self-pay | Admitting: Nurse Practitioner

## 2017-10-15 VITALS — BP 121/75 | HR 64 | Temp 97.6°F | Ht 74.0 in | Wt 176.6 lb

## 2017-10-15 DIAGNOSIS — I2699 Other pulmonary embolism without acute cor pulmonale: Secondary | ICD-10-CM | POA: Diagnosis not present

## 2017-10-15 DIAGNOSIS — D62 Acute posthemorrhagic anemia: Secondary | ICD-10-CM

## 2017-10-15 DIAGNOSIS — K219 Gastro-esophageal reflux disease without esophagitis: Secondary | ICD-10-CM | POA: Insufficient documentation

## 2017-10-15 MED ORDER — PANTOPRAZOLE SODIUM 40 MG PO TBEC: 40.0000 mg | DELAYED_RELEASE_TABLET | Freq: Two times a day (BID) | ORAL | 3 refills | Status: DC

## 2017-10-15 NOTE — Assessment & Plan Note (Signed)
GERD symptoms well controlled on twice a day PPI. He is asking about decreasing his PPI to once daily. Given that he is not having any symptoms and his EGD was essentially normal we can decrease to once a day. Return for follow-up in 6 months to evaluate his symptomatic progression on this. If he continues to be asymptomatic on once a day PPI, we can consider possibly discontinuing it.

## 2017-10-15 NOTE — Progress Notes (Signed)
Referring Provider: Alycia Rossetti, MD Primary Care Physician:  Alycia Rossetti, MD Primary GI:  Dr. Gala Romney  Chief Complaint  Patient presents with  . ugib    f/u doing ok    HPI:   Luis Stewart is a 66 y.o. male who presents For follow-up on upper GI bleed and pulmonary embolus. The patient was last seen in our office 07/15/2017. Last colonoscopy 2015 which was a normal colonoscopy recommended repeat exam in 5 years (2020). CBC completed 07/13/2017 found a decline in hemoglobin to 9.3 from baseline range of 11.2 - 11.9 over the previous month. Previous baseline before then was normal in the 14-15 range. Fecal occult blood was positive on 07/12/2017.  At last visit he was doing okay, recent knee replacement and had a postop pulmonary embolism placed on anticoagulation 07/17/2017. Started with black stools about 2-3 weeks prior to his last visit. PCP stopped anticoagulation and had another black stool. Has lower abdominal pain and a bowel movement twice a day to every other day with occasional straining. Declined prescription medication, Colace is helping. Abdominal pain improved after bowel movement. Denied NSAIDs. He was recommended to follow-up at the emergency department. He was subsequently admitted 07/15/2017 for melena. His hemoglobin was found to be between 8 and 9 on presentation with a baseline of 11. Upper endoscopy unremarkable, capsule endoscopy did not show any source of bleeding. Colonoscopy showed external and internal hemorrhoids without bleeding but again no source of active bleeding. Received 1 unit of PRBCs. Recommended avoid Xarelto, started on Eliquis. He was subsequently discharged 07/19/2017.  Today he states he's doing well overall. Has not noted any further bleeding. Denies hematochezia, melena. Denies worsening fatigue, weakness. Was in the ER for chest pain a week ok and cardiology workup was negative. PCP follow-up recommended cardiology referral which is  planned. GERD well controlled on bid PPI and is interested in daily dosing. PCP stopped Eliquis 09/18/17. CT of the chest last week confirmed no further PE. Denies abdominal pain, N/V, fever, chills, unintentional weight loss, acute changes in bowel habits. Denies current chest pain, dyspnea, dizziness, lightheadedness, syncope, near syncope. Denies any other upper or lower GI symptoms.  Past Medical History:  Diagnosis Date  . Arthritis   . Asbestosis ([place])   . Cyst of kidney, acquired   . Headache   . History of fracture of leg    Right, childhood  . Hx of migraine headaches   . Hyperlipidemia   . Neuropathy   . PE (pulmonary thromboembolism) ([place])    After knee surgery  . Prostate cancer ([place]) 2015   Adenocarcinoma by biopsy    Past Surgical History:  Procedure Laterality Date  . BIOPSY PROSTATE  2003 and 2005  . COLONOSCOPY  11/28/2008   Dr. Rourk:internal hemorrhoids/tortus colon/ascending colon polyps, tubular adenomas  . COLONOSCOPY N/A 09/04/2014   Procedure: COLONOSCOPY;  Surgeon: Daneil Dolin, MD;  [facility];  Service: Endoscopy;  Laterality: N/A;  9:30  . COLONOSCOPY N/A 07/17/2017   Procedure: COLONOSCOPY;  Surgeon: Danie Binder, MD;  [facility];  Service: Endoscopy;  Laterality: N/A;  . ESOPHAGOGASTRODUODENOSCOPY N/A 07/16/2017   Procedure: ESOPHAGOGASTRODUODENOSCOPY (EGD);  Surgeon: Danie Binder, MD;  [facility];  Service: Endoscopy;  Laterality: N/A;  . GIVENS CAPSULE STUDY  07/16/2017   Procedure: GIVENS CAPSULE STUDY;  Surgeon: Danie Binder, MD;  [facility];  Service: Endoscopy;;  . POLYPECTOMY  07/17/2017  Procedure: POLYPECTOMY;  Surgeon: Danie Binder, MD;  [facility];  Service: Endoscopy;;  cecal  . TOTAL KNEE ARTHROPLASTY Right 06/10/2017  . TOTAL KNEE ARTHROPLASTY Right 06/10/2017   Procedure: TOTAL KNEE ARTHROPLASTY;  Surgeon: [name], MD;  Location: [facility];  Service:  Orthopedics;  Laterality: Right;    Current Outpatient Prescriptions  Medication Sig Dispense Refill  . acetaminophen (TYLENOL) 500 MG tablet Take 1,000 mg by mouth every 6 (six) hours as needed for mild pain or moderate pain.    . Cholecalciferol (VITAMIN D) 2000 units CAPS Take 2,000 Units by mouth 3 (three) times a week.     . Multiple Vitamin (MULTIVITAMIN WITH MINERALS) TABS tablet Take 1 tablet by mouth daily. Centrum Silver for Men    . pantoprazole (PROTONIX) 40 MG tablet Take 1 tablet (40 mg total) by mouth 2 (two) times daily before a meal. 60 tablet 2   No current facility-administered medications for this visit.     Allergies as of 10/15/2017 - Review Complete 10/15/2017  Allergen Reaction Noted  . Sulfa antibiotics Other (See Comments) 05/26/2017    Family History  Problem Relation Age of Onset  . Diabetes Mother   . Hypertension Mother   . Obesity Mother   . Heart disease Mother   . Mental illness Mother   . Hypertension Sister   . Obesity Sister   . Arthritis Sister   . Deep vein thrombosis Father   . Dementia Father   . Heart disease Father        CABG  . Colon cancer Neg Hx     Social History   Social History  . Marital status: Married    Spouse name: N/A  . Number of children: N/A  . Years of education: N/A   Occupational History  . Retired    Social History Main Topics  . Smoking status: Never Smoker  . Smokeless tobacco: Never Used  . Alcohol use No  . Drug use: No  . Sexual activity: Yes   Other Topics Concern  . None   Social History Narrative  . None    Review of Systems: General: Negative for anorexia, weight loss, fever, chills, fatigue, weakness. ENT: Negative for hoarseness, difficulty swallowing. CV: Negative for chest pain, angina, palpitations, peripheral edema.  Respiratory: Negative for dyspnea at rest, cough, sputum, wheezing.  GI: See history of present illness. Endo: Negative for unusual weight change.  Heme:  Negative for bruising or bleeding.   Physical Exam: BP 121/75   Pulse 64   Temp 97.6 F (36.4 C) (Oral)   Ht 6\' 2"  (1.88 m)   Wt 176 lb 9.6 oz (80.1 kg)   BMI 22.67 kg/m  General:   Alert and oriented. Pleasant and cooperative. Well-nourished and well-developed.  Eyes:  Without icterus, sclera clear and conjunctiva pink.  Ears:  Normal auditory acuity. Cardiovascular:  S1, S2 present without murmurs appreciated. Extremities without clubbing or edema. Respiratory:  Clear to auscultation bilaterally. No wheezes, rales, or rhonchi. No distress.  Gastrointestinal:  +BS, soft, non-tender and non-distended. No HSM noted. No guarding or rebound. No masses appreciated.  Rectal:  Deferred  Musculoskalatal:  Symmetrical without gross deformities. Neurologic:  Alert and oriented x4;  grossly normal neurologically. Psych:  Alert and cooperative. Normal mood and affect. Heme/Lymph/Immune: No excessive bruising noted.    10/15/2017 11:29 AM   Disclaimer: This note was dictated with voice recognition software. Similar sounding words can inadvertently be transcribed and  may not be corrected upon review.

## 2017-10-15 NOTE — Progress Notes (Signed)
CC'D TO PCP °

## 2017-10-15 NOTE — Assessment & Plan Note (Signed)
Confirmed resolution of DVT on recent chest CT in about 1 week ago. His anticoagulation has been subsequently discontinued. No further bleeding, as per below. Return for follow-up in 6 months. ER precautions given related to chest pain, significant shortness of breath.

## 2017-10-15 NOTE — Assessment & Plan Note (Signed)
Acute blood loss anemia for which she was admitted for with noted melena and hemoglobin down to the upper 7 range. He received transfusions. He has not noted any further bleeding since discontinuation of his anticoagulant. At this point I will put and to recheck his CBC. Return for follow-up in 6 months. Call if any questions before then.

## 2017-10-15 NOTE — Patient Instructions (Signed)
1. Have your labs checked with the labs that your primary care orders tomorrow. 2. Back off on your Protonix acid blocker to once a day. Take this first thing in the morning, 20-30 minutes before you eat. 3. Return for follow-up in 6 months. 4. Call us if you have any questions or concerns.

## 2017-10-16 ENCOUNTER — Encounter: Payer: Self-pay | Admitting: Family Medicine

## 2017-10-16 ENCOUNTER — Ambulatory Visit (INDEPENDENT_AMBULATORY_CARE_PROVIDER_SITE_OTHER): Payer: Medicare Other | Admitting: Family Medicine

## 2017-10-16 VITALS — BP 128/72 | HR 68 | Temp 97.9°F | Resp 16 | Ht 74.0 in | Wt 178.0 lb

## 2017-10-16 DIAGNOSIS — C61 Malignant neoplasm of prostate: Secondary | ICD-10-CM | POA: Diagnosis not present

## 2017-10-16 DIAGNOSIS — Z0000 Encounter for general adult medical examination without abnormal findings: Secondary | ICD-10-CM

## 2017-10-16 DIAGNOSIS — E785 Hyperlipidemia, unspecified: Secondary | ICD-10-CM | POA: Diagnosis not present

## 2017-10-16 DIAGNOSIS — E559 Vitamin D deficiency, unspecified: Secondary | ICD-10-CM | POA: Diagnosis not present

## 2017-10-16 NOTE — Progress Notes (Addendum)
Subjective:   Patient presents for Medicare Annual/Subsequent preventive examination.    Pt here for annual wellness  Reviewed GI note, now on daily protonix  Has upcoming appointment with cardiology for atypical chest pain with family risk factors    Has appointment with pulmonary due to previous exposures asbestos    Prostate cancer screening- has surveillance PSA in Nov 27th with [facility]   Dentist in December- but no longer on blood thinners   Gets some neck stiffiness at times, takes tylenol on occasion    Review Past Medical/Family/Social: pER EMR    Risk Factors  Current exercise habits: walks Dietary issues discussed: None  Cardiac risk factors: Family history   Depression Screen  (Note: if answer to either of the following is "Yes", a more complete depression screening is indicated)  Over the past two weeks, have you felt down, depressed or hopeless? No Over the past two weeks, have you felt little interest or pleasure in doing things? No Have you lost interest or pleasure in daily life? No Do you often feel hopeless? No Do you cry easily over simple problems? No   Activities of Daily Living  In your present state of health, do you have any difficulty performing the following activities?:  Driving? No  Managing money? No  Feeding yourself? No  Getting from bed to chair? N Climbing a flight of stairs? No  Preparing food and eating?: No  Bathing or showering? No  Getting dressed: No  Getting to the toilet? No  Using the toilet:No  Moving around from place to place: No  In the past year have you fallen or had a near fall?:No  Are you sexually active? yes Do you have more than one partner? No   Hearing Difficulties: No  Do you often ask people to speak up or repeat themselves? No  Do you experience ringing or noises in your ears? No Do you have difficulty understanding soft or whispered voices? No  Do you feel that you have a problem with memory?  yes Do you often misplace items? Yes- Sometinmes   Do you feel safe at home? Yes  Cognitive Testing  Alert? Yes Normal Appearance?Yes  Oriented to person? Yes Place? Yes  Time? Yes  Recall of three objects? Yes  Can perform simple calculations? Yes  Displays appropriate judgment?Yes  Can read the correct time from a watch face?Yes   List the Names of Other Physician/Practitioners you currently use:  GI, Urology, Pulmonary , Cardiology    Screening Tests / Date Colonoscopy     UTD                 Zostavax UTD Pneumonia-UTD Influenza Vaccine UTD Tetanus/tdapUTD  ROS: GEN- denies fatigue, fever, weight loss,weakness, recent illness HEENT- denies eye drainage, change in vision, nasal discharge, CVS- denies chest pain, palpitations RESP- denies SOB, cough, wheeze ABD- denies N/V, change in stools, abd pain GU- denies dysuria, hematuria, dribbling, incontinence MSK- denies joint pain, muscle aches, injury Neuro- denies headache, dizziness, syncope, seizure activity   Physical:  GEN- NAD, alert and oriented x3 HEENT- PERRL, EOMI, non injected sclera, pink conjunctiva, MMM, oropharynx clear, TM clear bilat no effusion  Neck- Supple, no thryomegaly,fair ROM, neg spurlings  CVS- RRR, no murmur RESP-CTAB ABD-NABS,soft, NT,ND GU- deferred  NEURO-CNII-XII in tac,t MMSE 30/30 EXT- No edema Pulses- Radial, DP- 2+    Assessment:    Annual wellness medicare exam   Plan:    During the course of  the visit the patient was educated and counseled about appropriate screening and preventive services including:  Prevention UTD Immunizatios UTD   Fasting labs today  Screen Neg  for depression.  Check Vitamin D levels , continue the supplement Recheck CBC for his post operative anemia   F/u with all specialist   Discussed ways to keep mind healthy, puzzles etc For neck- can use heat/ topical anti-inflammatory , stretching , no red flags   Advanced Directives discussed- given  handouts   Diet review for nutrition referral? Yes ____ Not Indicated __x__  Patient Instructions (the written plan) was given to the patient.  Medicare Attestation  I have personally reviewed:  The patient's medical and social history  Their use of alcohol, tobacco or illicit drugs  Their current medications and supplements  The patient's functional ability including ADLs,fall risks, home safety risks, cognitive, and hearing and visual impairment  Diet and physical activities  Evidence for depression or mood disorders  The patient's weight, height, BMI, and visual acuity have been recorded in the chart. I have made referrals, counseling, and provided education to the patient based on review of the above and I have provided the patient with a written personalized care plan for preventive services.

## 2017-10-16 NOTE — Patient Instructions (Signed)
F/U 6 months  We will call with lab results  

## 2017-10-17 LAB — COMPREHENSIVE METABOLIC PANEL
AG Ratio: 1.2 (calc) (ref 1.0–2.5)
ALBUMIN MSPROF: 3.7 g/dL (ref 3.6–5.1)
ALT: 11 U/L (ref 9–46)
AST: 17 U/L (ref 10–35)
Alkaline phosphatase (APISO): 104 U/L (ref 40–115)
BUN/Creatinine Ratio: 13 (calc) (ref 6–22)
BUN: 17 mg/dL (ref 7–25)
CO2: 28 mmol/L (ref 20–32)
Calcium: 9.3 mg/dL (ref 8.6–10.3)
Chloride: 105 mmol/L (ref 98–110)
Creat: 1.3 mg/dL — ABNORMAL HIGH (ref 0.70–1.25)
Globulin: 3.1 g/dL (calc) (ref 1.9–3.7)
Glucose, Bld: 95 mg/dL (ref 65–99)
POTASSIUM: 4.4 mmol/L (ref 3.5–5.3)
SODIUM: 141 mmol/L (ref 135–146)
TOTAL PROTEIN: 6.8 g/dL (ref 6.1–8.1)
Total Bilirubin: 0.6 mg/dL (ref 0.2–1.2)

## 2017-10-17 LAB — CBC WITH DIFFERENTIAL/PLATELET
Basophils Absolute: 30 cells/uL (ref 0–200)
Basophils Relative: 0.8 %
Eosinophils Absolute: 118 cells/uL (ref 15–500)
Eosinophils Relative: 3.1 %
HCT: 37.7 % — ABNORMAL LOW (ref 38.5–50.0)
Hemoglobin: 12.3 g/dL — ABNORMAL LOW (ref 13.2–17.1)
Lymphs Abs: 1265 cells/uL (ref 850–3900)
MCH: 27 pg (ref 27.0–33.0)
MCHC: 32.6 g/dL (ref 32.0–36.0)
MCV: 82.9 fL (ref 80.0–100.0)
MPV: 11.3 fL (ref 7.5–12.5)
Monocytes Relative: 9.4 %
NEUTROS PCT: 53.4 %
Neutro Abs: 2029 cells/uL (ref 1500–7800)
PLATELETS: 216 10*3/uL (ref 140–400)
RBC: 4.55 10*6/uL (ref 4.20–5.80)
RDW: 14.7 % (ref 11.0–15.0)
TOTAL LYMPHOCYTE: 33.3 %
WBC: 3.8 10*3/uL (ref 3.8–10.8)
WBCMIX: 357 {cells}/uL (ref 200–950)

## 2017-10-17 LAB — LIPID PANEL
Cholesterol: 176 mg/dL (ref <200)
HDL: 53 mg/dL (ref >40)
LDL CHOLESTEROL (CALC): 105 mg/dL — AB
NON-HDL CHOLESTEROL (CALC): 123 mg/dL (ref <130)
TRIGLYCERIDES: 89 mg/dL (ref <150)
Total CHOL/HDL Ratio: 3.3 (calc) (ref <5.0)

## 2017-10-17 LAB — VITAMIN D 25 HYDROXY (VIT D DEFICIENCY, FRACTURES): VIT D 25 HYDROXY: 41 ng/mL (ref 30–100)

## 2017-10-19 ENCOUNTER — Other Ambulatory Visit: Payer: Self-pay | Admitting: Internal Medicine

## 2017-10-19 DIAGNOSIS — R0600 Dyspnea, unspecified: Secondary | ICD-10-CM

## 2017-10-20 ENCOUNTER — Encounter: Payer: Self-pay | Admitting: Internal Medicine

## 2017-10-20 ENCOUNTER — Ambulatory Visit (INDEPENDENT_AMBULATORY_CARE_PROVIDER_SITE_OTHER): Payer: Worker's Compensation | Admitting: Internal Medicine

## 2017-10-20 VITALS — BP 114/68 | HR 65 | Ht 74.0 in | Wt 180.0 lb

## 2017-10-20 DIAGNOSIS — R05 Cough: Secondary | ICD-10-CM

## 2017-10-20 DIAGNOSIS — Z77090 Contact with and (suspected) exposure to asbestos: Secondary | ICD-10-CM

## 2017-10-20 DIAGNOSIS — R058 Other specified cough: Secondary | ICD-10-CM

## 2017-10-20 LAB — PULMONARY FUNCTION TEST
DL/VA % pred: 124 %
DL/VA: 5.98 ml/min/mmHg/L
DLCO COR % PRED: 92 %
DLCO COR: 34.97 ml/min/mmHg
DLCO unc % pred: 83 %
DLCO unc: 31.35 ml/min/mmHg
FEF 25-75 POST: 3.58 L/s
FEF 25-75 Pre: 2.82 L/sec
FEF2575-%CHANGE-POST: 26 %
FEF2575-%PRED-PRE: 94 %
FEF2575-%Pred-Post: 119 %
FEV1-%CHANGE-POST: 3 %
FEV1-%PRED-POST: 92 %
FEV1-%Pred-Pre: 88 %
FEV1-POST: 3.18 L
FEV1-Pre: 3.06 L
FEV1FVC-%CHANGE-POST: 8 %
FEV1FVC-%PRED-PRE: 102 %
FEV6-%Change-Post: -3 %
FEV6-%PRED-PRE: 88 %
FEV6-%Pred-Post: 85 %
FEV6-Post: 3.69 L
FEV6-Pre: 3.84 L
FEV6FVC-%Change-Post: 0 %
FEV6FVC-%Pred-Post: 103 %
FEV6FVC-%Pred-Pre: 103 %
FVC-%CHANGE-POST: -3 %
FVC-%PRED-PRE: 85 %
FVC-%Pred-Post: 82 %
FVC-POST: 3.72 L
FVC-PRE: 3.87 L
PRE FEV1/FVC RATIO: 79 %
PRE FEV6/FVC RATIO: 99 %
Post FEV1/FVC ratio: 86 %
Post FEV6/FVC ratio: 99 %
RV % pred: 83 %
RV: 2.17 L
TLC % pred: 78 %
TLC: 6.14 L

## 2017-10-20 NOTE — Patient Instructions (Addendum)
Protonix 40 mg be sure to Take 30-60 min before first meal of the day   GERD (REFLUX)  is an extremely common cause of respiratory symptoms just like yours , many times with no obvious heartburn at all.    It can be treated with medication, but also with lifestyle changes including elevation of the head of your bed (ideally with 6 inch  bed blocks),  Smoking cessation, avoidance of late meals, excessive alcohol, and avoid fatty foods, chocolate, peppermint, colas, red wine, and acidic juices such as orange juice.  NO MINT OR MENTHOL PRODUCTS SO NO COUGH DROPS   USE SUGARLESS CANDY INSTEAD (Jolley ranchers or Stover's or Life Savers) or even ice chips will also do - the key is to swallow to prevent all throat clearing. NO OIL BASED VITAMINS - use powdered substitutes.   Please schedule a follow up visit in 12  months but call sooner if needed

## 2017-10-20 NOTE — Assessment & Plan Note (Signed)
Upper airway cough syndrome (previously labeled PNDS),  is so named because it's frequently impossible to sort out how much is  CR/sinusitis with freq throat clearing (which can be related to primary GERD)   vs  causing  secondary (" extra esophageal")  GERD from wide swings in gastric pressure that occur with throat clearing, often  promoting self use of mint and menthol lozenges that reduce the lower esophageal sphincter tone and exacerbate the problem further in a cyclical fashion.   These are the same pts (now being labeled as having "irritable larynx syndrome" by some cough centers) who not infrequently have a history of having failed to tolerate ace inhibitors,  dry powder inhalers or biphosphonates or report having atypical/extraesophageal reflux symptoms that don't respond to standard doses of PPI  and are easily confused as having aecopd or asthma flares by even experienced allergists/ pulmonologists (myself included).   rec diet and continue to take ppi but use it ac as rec and if still having upper airway symptoms can refer to allergy or return here for allergy screening and trial of rx directed at pnds non-gerd related.   I had an extended discussion with the patient reviewing all relevant studies completed to date and  lasting 15 to 20 minutes of a 25 minute visit    Each maintenance medication was reviewed in detail including most importantly the difference between maintenance and prns and under what circumstances the prns are to be triggered using an action plan format that is not reflected in the computer generated alphabetically organized AVS.    Please see AVS for specific instructions unique to this visit that I personally wrote and verbalized to the the pt in detail and then reviewed with pt  by my nurse highlighting any  changes in therapy recommended at today's visit to their plan of care.

## 2017-10-20 NOTE — Patient Instructions (Signed)
PFT done today. 

## 2017-10-20 NOTE — Progress Notes (Signed)
Subjective:    Patient ID: Luis Stewart, male    DOB: 08-25-1951    MRN: [id number]    Brief patient profile:  69   yobm from [place]  never smoker worked for [employer] last exposed abestos ?2003 with abn cxr 2014 referred by Dr Buelah Manis to pulmonary clinic 08/28/2015 with rec from [facility] power to get pfts yearly.   History of Present Illness  08/28/2015 1st [facility] office visit/ Luis Stewart   Chief Complaint  Patient presents with  . Pulmonary Consult    Referred by Dr. Vic Blackbird. Pt has hx of asbestos exposure. He states that he is overall doing well. He does have some prod cough with clear to yellow sputum.    eval by Dr Beverly Gust with ? increaesed int markings but no f/u ct to date  Am cough x decades/ sensation of pnds / has not tried otc's rec Please see patient coordinator before you leave today  to schedule HRCT chest and return here same day with pfts on return  Try zyrtec 10 mg at bedtime to see if your daily am cough is better if now I recommend For drainage take chlortrimeton (chlorpheniramine) 4 mg every 4 hours available over the counter (may cause drowsiness)    09/27/2015  f/u ov/Luis Stewart re: asbestos f/u  Chief Complaint  Patient presents with  . Follow-up    Hx of asbestos exposure. Pt states that hsi breathing is doing well. Pt c/o of occasional wet cough, yellow to clear mucus. Pt denies wheeze/CP/SOB.    Not limited by breathing from desired activities  rec Try zyrtec 10 mg at bedtime to see if your daily am cough is better if not I recommend For drainage take chlortrimeton (chlorpheniramine) 4 mg every 4 hours available over the counter (may cause drowsiness)    10/20/2016  f/u ov/Luis Stewart re: asbestos f/u  Chief Complaint  Patient presents with  . Follow-up    PFT's and cxr done today. He c/o soreness in his chest for the past year. He has had some PND and sore throat for the past day.   not using otc's as rec "too sleepy" but actually did work    rec Return in one year for pfts and cxr  - call for earlier follow up for cough or breathing issues  Try clariton or allergra as needed for drainage but try not to clear throat  GERD diet    10/20/2017  f/u ov/Luis Stewart re: asbestos f/u  Chief Complaint  Patient presents with  . Follow-up    PFT today prior to visit. Pt has productive cough-clear. Pt has SOB, wheezing with cough. Pt had knee replacement in June 2018 had PE afterwards.  really not limited by breathing and R knee doing well.  No noct cough/ wheeze/ only sob if overdoes it Off anticoagulants since Sep 21 2017  No obvious day to day or daytime variability or assoc excess/ purulent sputum or mucus plugs or hemoptysis or cp or chest tightness, subjective wheeze or overt sinus or hb symptoms. No unusual exp hx or h/o childhood pna/ asthma or knowledge of premature birth.  Sleeping ok flat without nocturnal  or early am exacerbation  of respiratory  c/o's or need for noct saba. Also denies any obvious fluctuation of symptoms with weather or environmental changes or other aggravating or alleviating factors except as outlined above   Current Allergies, Complete Past Medical History, Past Surgical History, Family History, and Social History were reviewed  in [employer] record.  ROS  The following are not active complaints unless bolded Hoarseness, sore throat, dysphagia, dental problems, itching, sneezing,  nasal congestion or discharge of excess mucus or purulent secretions, ear ache,   fever, chills, sweats, unintended wt loss or wt gain, classically pleuritic or exertional cp,  orthopnea pnd or leg swelling, presyncope, palpitations, abdominal pain, anorexia, nausea, vomiting, diarrhea  or change in bowel habits or change in bladder habits, change in stools or change in urine, dysuria, hematuria,  rash, arthralgias, visual complaints, headache, numbness, weakness or ataxia or problems with walking or coordination,   change in mood/affect or memory.        Current Meds - not able to confirm   Medication Sig  . acetaminophen (TYLENOL) 500 MG tablet Take 1,000 mg by mouth every 6 (six) hours as needed for mild pain or moderate pain.  . Cholecalciferol (VITAMIN D) 2000 units CAPS Take 2,000 Units by mouth 3 (three) times a week.   . Multiple Vitamin (MULTIVITAMIN WITH MINERALS) TABS tablet Take 1 tablet by mouth daily. Centrum Silver for Men  . pantoprazole (PROTONIX) 40 MG tablet Take 1 tablet (40 mg total) by mouth 2 (two) times daily before a meal. (Patient taking differently: Take 40 mg by mouth daily. )              Objective:   Physical Exam  amb pleasant bm nad  09/27/2015       184 >  10/20/2016    189 > 10/20/2017   180     08/28/15 177 lb 12.8 oz (80.65 kg)  08/17/15 178 lb (80.74 kg)  09/04/14 184 lb (83.462 kg)    Vital signs reviewed  - - Note on arrival 02 sats   100% on RA   HEENT: nl dentition, turbinates, and oropharynx. Nl external ear canals without cough reflex   NECK :  without JVD/Nodes/TM/ nl carotid upstrokes bilaterally   LUNGS: no acc muscle use, clear to A and P bilaterally without cough on insp or exp maneuvers   CV:  RRR  no s3 or murmur or increase in P2, no edema  - wearing elastic hose both legs  ABD:  soft and nontender with nl excursion in the supine position. No bruits or organomegaly, bowel sounds nl  MS:  warm without deformities, calf tenderness, cyanosis or clubbing  SKIN: warm and dry without lesions    NEURO:  alert, approp, no deficits       I personally reviewed images and agree with radiology impression as follows:  Chest CT a  10/06/17 No evidence of pulmonary embolism. Previously seen right-sided segmental pulmonary emboli have resolved. No acute intrathoracic process.     Assessment & Plan:

## 2017-10-20 NOTE — Assessment & Plan Note (Signed)
HRCT   09/27/15  1. No evidence of asbestos related pleural disease. 2. No evidence of interstitial lung disease. Specifically, no significant regions of subpleural reticulation, ground-glass attenuation, traction bronchiectasis or frank honeycombing. 3. Mild air trapping, suggestive of nonspecific mild small airway disease. 4. Solitary 4 mm right middle lobe pulmonary nodule with a benign Appearance. - PFT's 09/27/2015 wnl with FVC  4.33  97/96%  > 121 %  Corrected for alv vol  - PFTs  10/20/2017  wnl     FVC   3.87(85%)  With dlco 83/92c and corrects to 124% for alv vol - CTa 10/06/17 neg for asbestos changes    No evidence of ILD or pleural dz 15 years out with non-specific upper resp symptoms c/w UACS  rec continue yearly pfts/ see sooner if any new symptoms/ CT chest probably not needed unless new symptoms and certainly no guidelines for more aggressive imaging in this never smoker  Discussed in detail all the  indications, usual  risks and alternatives  relative to the benefits with patient and wife  who agree  to proceed with conservative f/u as outlined

## 2017-10-27 NOTE — Progress Notes (Signed)
Cardiology Office Note  Date: 10/28/2017   ID: Luis Stewart, DOB 06/06/1951, MRN [id number]  PCP: Alycia Rossetti, MD  Primary Cardiologist: Rozann Lesches, MD   Chief Complaint  Patient presents with  . Chest Pain    History of Present Illness: Luis Stewart is a 66 y.o. male seen in consultation back in October 2016 with a history of atypical chest pain, abnormal ECG, and coronary artery calcifications by chest CT imaging.  He underwent a Myoview study at that time that was low risk with no clear evidence of ischemia.  He is referred back to the office by Dr. Buelah Manis due to recurrent chest pain.  He is here today with his wife.  He states that he awoke one morning back in mid October with a feeling of chest tightness, was seen in the ER with negative troponin I levels and no acute ECG changes.  Also had a chest CTA which showed resolution of previously documented pulmonary emboli that occurred after right knee replacement.  He states that he is continued to have intermittent chest discomfort, there is no trigger, not worse with exertion, no associated shortness of breath or palpitations.  Of note, he does have a history of GI bleed when on Xarelto, was seen by GI recently in late October in follow-up.  He is followed by Dr. Melvyn Novas in the pulmonary clinic with previous history of asbestos exposure, I reviewed the recent note.  Overall he has been stable from this perspective.  Past Medical History:  Diagnosis Date  . Arthritis   . Asbestosis ([place])   . Cyst of kidney, acquired   . Headache   . History of fracture of leg    Right, childhood  . Hx of migraine headaches   . Hyperlipidemia   . Neuropathy   . PE (pulmonary thromboembolism) ([place])    After knee surgery  . Prostate cancer ([place]) 2015   Adenocarcinoma by biopsy    Past Surgical History:  Procedure Laterality Date  . BIOPSY PROSTATE  2003 and 2005  . COLONOSCOPY  11/28/2008   Dr. Rourk:internal  hemorrhoids/tortus colon/ascending colon polyps, tubular adenomas  . TOTAL KNEE ARTHROPLASTY Right 06/10/2017    Current Outpatient Medications  Medication Sig Dispense Refill  . acetaminophen (TYLENOL) 500 MG tablet Take 1,000 mg by mouth every 6 (six) hours as needed for mild pain or moderate pain.    . Cholecalciferol (VITAMIN D) 2000 units CAPS Take 2,000 Units by mouth 3 (three) times a week.     . Multiple Vitamin (MULTIVITAMIN WITH MINERALS) TABS tablet Take 1 tablet by mouth daily. Centrum Silver for Men    . pantoprazole (PROTONIX) 40 MG tablet Take 40 mg daily by mouth.    . pantoprazole (PROTONIX) 40 MG tablet Take 1 tablet (40 mg total) by mouth 2 (two) times daily before a meal. (Patient taking differently: Take 40 mg by mouth daily. ) 180 tablet 3   No current facility-administered medications for this visit.    Allergies:  Sulfa antibiotics   Social History: The patient  reports that  has never smoked. he has never used smokeless tobacco. He reports that he does not drink alcohol or use drugs.   ROS:  Please see the history of present illness. Otherwise, complete review of systems is positive for none.  All other systems are reviewed and negative.   Physical Exam: VS:  BP 124/70   Pulse 64   Ht 6\' 2"  (1.88 m)  Wt 181 lb (82.1 kg)   SpO2 95% Comment: on room air  BMI 23.24 kg/m , BMI Body mass index is 23.24 kg/m.  Wt Readings from Last 3 Encounters:  10/28/17 181 lb (82.1 kg)  10/20/17 180 lb (81.6 kg)  10/16/17 178 lb (80.7 kg)    General: Patient appears comfortable at rest. HEENT: Conjunctiva and lids normal, oropharynx clear. Neck: Supple, no elevated JVP or carotid bruits, no thyromegaly. Lungs: Clear to auscultation, nonlabored breathing at rest. Cardiac: Regular rate and rhythm, no S3 or significant systolic murmur, no pericardial rub. Abdomen: Soft, nontender, bowel sounds present, no guarding or rebound. Extremities: No pitting edema, distal pulses  2+. Skin: Warm and dry. Musculoskeletal: No kyphosis. Neuropsychiatric: Alert and oriented x3, affect grossly appropriate.  ECG: I personally reviewed the tracing from 10/06/2017 which showed sinus rhythm with possible left atrial enlargement, R' in lead V1 and V2.  Recent Labwork: 07/18/2017: Magnesium 1.8 10/16/2017: ALT 11; AST 17; BUN 17; Creat 1.30; Hemoglobin 12.3; Platelets 216; Potassium 4.4; Sodium 141     Component Value Date/Time   CHOL 176 10/16/2017 0911   TRIG 89 10/16/2017 0911   HDL 53 10/16/2017 0911   CHOLHDL 3.3 10/16/2017 0911   VLDL 14 10/15/2016 0906   LDLCALC 128 10/15/2016 0906    Other Studies Reviewed Today:  Exercise Myoview 10/05/2015:  No diagnostic ST segment changes by standard criteria. Adequate heart rate response noted with sinus rhythm throughout. Ectopic atrial rhythm noted at the end of recovery.  Blood pressure demonstrated a hypertensive response to exercise.  Perfusion imaging is most consistent with attenuation artifact in the inferolateral wall on rest imaging. No clear evidence of scar or ischemia.  This is a low risk study.  Nuclear stress EF: 67%.  Echocardiogram 10/05/2015: Study Conclusions  - Left ventricle: The cavity size was normal. Wall thickness was   normal. Systolic function was normal. The estimated ejection   fraction was in the range of 60% to 65%. Wall motion was normal;   there were no regional wall motion abnormalities. Left   ventricular diastolic function parameters were normal for the   patient&'s age. - Mitral valve: There was trivial regurgitation. - Left atrium: The atrium was at the upper limits of normal in   size. - Tricuspid valve: There was trivial regurgitation. - Pulmonary arteries: Systolic pressure could not be accurately   estimated. - Pericardium, extracardiac: There was no pericardial effusion.  Impressions:  - Normal LV wall thickness with LVEF 60-65% and normal diastolic    function. Upper normal left atrial chamber size. Trivial mitral   and tricuspid regurgitation.  Chest CT 10/06/2017: Cardiovascular: Satisfactory opacification of the pulmonary arteries to the segmental level. No evidence of pulmonary embolism. Previously seen right-sided segmental pulmonary emboli have resolved. Normal heart size. No pericardial effusion. Normal caliber thoracic aorta.  Mediastinum/Nodes: No enlarged mediastinal, hilar, or axillary lymph nodes. Thyroid gland, trachea, and esophagus demonstrate no significant findings.  Lungs/Pleura: Lungs are clear. No pleural effusion or pneumothorax.  Upper Abdomen: No acute abnormality. Innumerable hepatic cysts are again seen.  Musculoskeletal: No chest wall abnormality. No acute or significant osseous findings.  Review of the MIP images confirms the above findings.  IMPRESSION: 1. No evidence of pulmonary embolism. Previously seen right-sided segmental pulmonary emboli have resolved. No acute intrathoracic process.  Assessment and Plan:  1.  Atypical chest pain, recurrent since mid October without definite triggers.  Troponin I levels were negative at ER visit and repeat chest  CTA showed resolution of previously documented pulmonary emboli.  He has a history of coronary artery calcifications by ischemic workup was in 2016.  Plan is to follow-up with a Lexiscan Myoview to assess for progressive ischemia.  2.  History of asbestos exposure, previously worked for [employer].  He follows with Dr. Melvyn Novas in the Pulmonary clinic.  Recent reviewed.  3.  Intermittent indigestion, history of GI bleed when on Xarelto for treatment of postoperative pulmonary embolus.  He has been followed by GI and had an unremarkable endoscopy with capsule endoscopy not showing any obvious bleeding source.  External and internal hemorrhoids noted without active bleeding by colonoscopy.  4.  History of hyperlipidemia, continues to follow with Dr.  Buelah Manis.  Current medicines were reviewed with the patient today.   Orders Placed This Encounter  Procedures  . NM Myocar Multi W/Spect W/Wall Motion / EF    Disposition: Call with test results.  Signed, Satira Sark, MD, [facility] 10/28/2017 1:08 PM     [facility] [address] [place], [place] [address] Phone: [phone]; Fax: [phone]

## 2017-10-28 ENCOUNTER — Ambulatory Visit: Payer: Self-pay | Admitting: Cardiology

## 2017-10-28 ENCOUNTER — Encounter: Payer: Self-pay | Admitting: Cardiology

## 2017-10-28 ENCOUNTER — Encounter: Payer: Self-pay | Admitting: *Deleted

## 2017-10-28 ENCOUNTER — Ambulatory Visit (INDEPENDENT_AMBULATORY_CARE_PROVIDER_SITE_OTHER): Payer: Medicare Other | Admitting: Cardiology

## 2017-10-28 VITALS — BP 124/70 | HR 64 | Ht 74.0 in | Wt 181.0 lb

## 2017-10-28 DIAGNOSIS — Z77090 Contact with and (suspected) exposure to asbestos: Secondary | ICD-10-CM | POA: Diagnosis not present

## 2017-10-28 DIAGNOSIS — Z8719 Personal history of other diseases of the digestive system: Secondary | ICD-10-CM

## 2017-10-28 DIAGNOSIS — I2510 Atherosclerotic heart disease of native coronary artery without angina pectoris: Secondary | ICD-10-CM | POA: Diagnosis not present

## 2017-10-28 DIAGNOSIS — Z86711 Personal history of pulmonary embolism: Secondary | ICD-10-CM | POA: Diagnosis not present

## 2017-10-28 DIAGNOSIS — R0789 Other chest pain: Secondary | ICD-10-CM | POA: Diagnosis not present

## 2017-10-28 DIAGNOSIS — E785 Hyperlipidemia, unspecified: Secondary | ICD-10-CM | POA: Diagnosis not present

## 2017-10-28 NOTE — Patient Instructions (Signed)
Medication Instructions:   Your physician recommends that you continue on your current medications as directed. Please refer to the Current Medication list given to you today.  Labwork:  NONE  Testing/Procedures: Your physician has requested that you have a lexiscan myoview. For further information please visit [email or web address]. Please follow instruction sheet, as given.  Follow-Up:  Your physician recommends that you schedule a follow-up appointment in: pending lexiscan.  Any Other Special Instructions Will Be Listed Below (If Applicable).  If you need a refill on your cardiac medications before your next appointment, please call your pharmacy.

## 2017-11-04 ENCOUNTER — Encounter (HOSPITAL_COMMUNITY)
Admission: RE | Admit: 2017-11-04 | Discharge: 2017-11-04 | Disposition: A | Discharge status: Home / Self Care | Payer: Medicare Other | Source: Ambulatory Visit | Attending: Cardiology | Admitting: Cardiology

## 2017-11-04 ENCOUNTER — Ambulatory Visit (HOSPITAL_COMMUNITY)
Admission: RE | Admit: 2017-11-04 | Discharge: 2017-11-04 | Disposition: A | Discharge status: Home / Self Care | Payer: Medicare Other | Source: Ambulatory Visit | Attending: Cardiology | Admitting: Cardiology

## 2017-11-04 DIAGNOSIS — I2510 Atherosclerotic heart disease of native coronary artery without angina pectoris: Secondary | ICD-10-CM | POA: Insufficient documentation

## 2017-11-04 LAB — NM MYOCAR MULTI W/SPECT W/WALL MOTION / EF
CHL CUP NUCLEAR SDS: 0
LHR: 0.35
LV dias vol: 100 mL (ref 62–150)
LV sys vol: 33 mL
NUC STRESS TID: 1.06
Peak HR: 87 {beats}/min
Rest HR: 56 {beats}/min
SRS: 0
SSS: 0

## 2017-11-04 MED ORDER — SODIUM CHLORIDE 0.9% FLUSH
INTRAVENOUS | Status: AC
  Administered 2017-11-04: 10 mL via INTRAVENOUS
  Filled 2017-11-04: qty 10

## 2017-11-04 MED ORDER — SODIUM CHLORIDE 0.9% FLUSH
INTRAVENOUS | Status: AC
  Filled 2017-11-04: qty 180

## 2017-11-04 MED ORDER — REGADENOSON 0.4 MG/5ML IV SOLN
INTRAVENOUS | Status: AC
  Administered 2017-11-04: 0.4 mg via INTRAVENOUS
  Filled 2017-11-04: qty 5

## 2017-11-04 MED ORDER — TECHNETIUM TC 99M TETROFOSMIN IV KIT
30.0000 | PACK | Freq: Once | INTRAVENOUS | Status: AC | PRN
  Administered 2017-11-04: 30 via INTRAVENOUS

## 2017-11-04 MED ORDER — TECHNETIUM TC 99M TETROFOSMIN IV KIT
30.0000 | PACK | Freq: Once | INTRAVENOUS | Status: AC | PRN
  Administered 2017-11-04: 9 via INTRAVENOUS

## 2017-11-11 ENCOUNTER — Other Ambulatory Visit: Payer: Self-pay | Admitting: Urology

## 2017-11-11 DIAGNOSIS — D49511 Neoplasm of unspecified behavior of right kidney: Secondary | ICD-10-CM

## 2017-11-11 DIAGNOSIS — C61 Malignant neoplasm of prostate: Secondary | ICD-10-CM | POA: Diagnosis not present

## 2017-11-17 ENCOUNTER — Ambulatory Visit (INDEPENDENT_AMBULATORY_CARE_PROVIDER_SITE_OTHER): Payer: Medicare Other | Admitting: Urology

## 2017-11-17 DIAGNOSIS — C61 Malignant neoplasm of prostate: Secondary | ICD-10-CM

## 2017-11-17 DIAGNOSIS — N401 Enlarged prostate with lower urinary tract symptoms: Secondary | ICD-10-CM

## 2017-12-01 DIAGNOSIS — M1711 Unilateral primary osteoarthritis, right knee: Secondary | ICD-10-CM | POA: Diagnosis not present

## 2018-01-22 ENCOUNTER — Encounter: Payer: Self-pay | Admitting: Family Medicine

## 2018-01-22 ENCOUNTER — Ambulatory Visit (INDEPENDENT_AMBULATORY_CARE_PROVIDER_SITE_OTHER): Payer: Medicare HMO | Admitting: Family Medicine

## 2018-01-22 ENCOUNTER — Other Ambulatory Visit: Payer: Self-pay

## 2018-01-22 VITALS — BP 128/62 | HR 62 | Temp 98.2°F | Resp 16 | Ht 74.0 in | Wt 181.0 lb

## 2018-01-22 DIAGNOSIS — J069 Acute upper respiratory infection, unspecified: Secondary | ICD-10-CM

## 2018-01-22 DIAGNOSIS — S46911A Strain of unspecified muscle, fascia and tendon at shoulder and upper arm level, right arm, initial encounter: Secondary | ICD-10-CM

## 2018-01-22 NOTE — Patient Instructions (Addendum)
Continue robitussin DM Nasal saline, flonase or nasocort  Plenty/vitamin C  Use muscle rub such as aspercreme  F/U as needed

## 2018-01-22 NOTE — Progress Notes (Signed)
   Subjective:    Patient ID: Luis Stewart, male    DOB: Jan 13, 1951, 67 y.o.   MRN: [id number]  Patient presents for Illness (x2 days- nasal congstion, sinus pressure, sore throat, neck/ shoulder pain, denies fever, prodctive cough with clear to yellow colored mucus)  Pt here with sinus pressure and drainage,sore throat, mild cough with production  No fever . Sick contact with wife  Robitussin for cough last night  No vomiting or diarrhea  Able to eat without difficulty   Has some tightness in right shoulder, no specific injury, has not used anything to help, feels more like tension     Review Of Systems:  GEN- denies fatigue, fever, weight loss,weakness, recent illness HEENT- denies eye drainage, change in vision, +nasal discharge, CVS- denies chest pain, palpitations RESP- denies SOB, +cough, wheeze ABD- denies N/V, change in stools, abd pain GU- denies dysuria, hematuria, dribbling, incontinence MSK-+ joint pain, muscle aches, injury Neuro- denies headache, dizziness, syncope, seizure activity       Objective:    BP 128/62   Pulse 62   Temp 98.2 F (36.8 C) (Oral)   Resp 16   Ht 6\' 2"  (1.88 m)   Wt 181 lb (82.1 kg)   SpO2 98%   BMI 23.24 kg/m  GEN- NAD, alert and oriented x3 HEENT- PERRL, EOMI, non injected sclera, pink conjunctiva, MMM, oropharynx clear, TM clear bilat no effusion, no maxillary sinus tenderness, inflammed turbinates,  Nasal drainage  Neck- Supple, no LAD CVS- RRR, no murmur RESP-CTAB MSK- FROM upper ext, rotator cuff in tact, biceps in tact, mild TTP upper post shoulder  EXT- No edema Pulses- Radial 2+           Assessment & Plan:      Problem List Items Addressed This Visit    None    Visit Diagnoses    Viral URI    -  Primary   supportive care, fluids, rest, nasal saline/steroid, robitussin /vitamin C   Strain of right shoulder, initial encounter       No specific injury, good strength more muscular, topical  anti-inflammatory      Note: This dictation was prepared with Dragon dictation along with smaller phrase technology. Any transcriptional errors that result from this process are unintentional.

## 2018-04-15 ENCOUNTER — Ambulatory Visit: Payer: Medicare HMO | Admitting: Nurse Practitioner

## 2018-04-15 ENCOUNTER — Encounter: Payer: Self-pay | Admitting: Nurse Practitioner

## 2018-04-15 VITALS — BP 133/75 | HR 64 | Temp 97.1°F | Ht 74.0 in | Wt 186.8 lb

## 2018-04-15 DIAGNOSIS — Z86711 Personal history of pulmonary embolism: Secondary | ICD-10-CM | POA: Diagnosis not present

## 2018-04-15 DIAGNOSIS — K219 Gastro-esophageal reflux disease without esophagitis: Secondary | ICD-10-CM | POA: Diagnosis not present

## 2018-04-15 DIAGNOSIS — R195 Other fecal abnormalities: Secondary | ICD-10-CM | POA: Diagnosis not present

## 2018-04-15 NOTE — Progress Notes (Signed)
CC'ED TO PCP 

## 2018-04-15 NOTE — Assessment & Plan Note (Signed)
Continued good control of GERD with decreased dose of PPI to once daily.  At this point we will keep him on once daily dosing for now.  We can consider eliminating in the future pending tolerance.  Continue other medications, trigger avoidance, lifestyle factors.  Follow-up in 1 year.

## 2018-04-15 NOTE — Patient Instructions (Signed)
1. Continue taking your acid blocker (Protonix or pantoprazole) once a day. 2. Continue your other medications. 3. Follow-up with your other providers as they recommend. 4. Follow-up here in 1 year. 5. Avoid any foods or beverages that trigger your heartburn symptoms. 6. Call us if you develop any new or worsening symptoms. 7. Call us if you have any questions or concerns.   It was great to see you today!  I hope you have a wonderful summer and year!!!    At [facility] we value your feedback. You may receive a survey about your visit today. Please share your experience as we strive to create trusting relationships with our patients to provide genuine, compassionate, quality care.

## 2018-04-15 NOTE — Assessment & Plan Note (Signed)
Noted melena previously on anticoagulant after knee surgery and development of pulmonary embolism.  He is now off his anticoagulation.  Notes no further bleeding.  Continue other medications, follow-up with other providers as they recommend.  Follow-up in 1 year.

## 2018-04-15 NOTE — Assessment & Plan Note (Signed)
Documented resolution of pulmonary embolism on CT chest.  Subsequently his anticoagulation was stopped.  No further respiratory signs or symptoms.  Follow-up with other providers as they recommend.  Follow-up here in 1 year.

## 2018-04-15 NOTE — Progress Notes (Signed)
Referring Provider: Alycia Rossetti, MD Primary Care Physician:  Alycia Rossetti, MD Primary GI:  Dr. Gala Romney  Chief Complaint  Patient presents with  . Gastroesophageal Reflux    f/u, doing ok    HPI:   Luis Stewart is a 67 y.o. male who presents for follow-up on GERD in the setting of acute pulmonary embolism.  The patient was last seen in our office 10/15/2017 for the same as well as acute blood loss anemia.  Colonoscopy up-to-date next due in 2020.  When he was previously seen there was a decline in hemoglobin to 9.3 from a baseline in the 11 range.  Noted history of knee replacement with postop pulmonary embolism in 2018 and placed on anticoagulation.  Subsequently had melena for 2 to 3 weeks.  Donnell pain that improves after bowel movement.  He was admitted for melena and underwent full endoscopic evaluation including EGD, capsule which was essentially normal.  Colonoscopy with external and internal hemorrhoids and no source of active bleeding.  Required a unit of blood cells.  Recommended avoid Xarelto, started on Eliquis.  At his last visit he was doing well, no further bleeding or symptoms of anemia.  GERD well controlled on twice daily PPI and interested in daily dosing.  Eliquis stopped 09/18/2017.  CT of the chest confirmed no further pulmonary embolism.  No other GI symptoms.  Did have an emergency room visit for chest pain and was worked up with negative cardiac exam.  He was recommended to see cardiology as an outpatient.  Cardiology did see him in the office on 10/28/2017 and recommended Lexiscan Myoview for ischemia evaluation due to recurrent atypical chest pain.  Myoview completed 11/04/2017 which found no ST segment deviation, normal study without evidence of myocardial ischemia or scar.  EF calculated at 67%.  Today he states he's doing well overall. No further bleeding. GERD well managed currently on PPI daily (decreased from twice daily last visit.) Denies  recurrent abdominal pain, N/V, hematochezia, melena, fever, chills, unintentional weight loss. Rare mild constipation, otherwise daily soft bowel movement. Denies chest pain, dyspnea, dizziness, lightheadedness, syncope, near syncope. Denies any other upper or lower GI symptoms.   Past Medical History:  Diagnosis Date  . Arthritis   . Asbestosis ([place])   . Cyst of kidney, acquired   . Headache   . History of fracture of leg    Right, childhood  . Hx of migraine headaches   . Hyperlipidemia   . Neuropathy   . PE (pulmonary thromboembolism) ([place])    After knee surgery  . Prostate cancer ([place]) 2015   Adenocarcinoma by biopsy    Past Surgical History:  Procedure Laterality Date  . BIOPSY PROSTATE  2003 and 2005  . COLONOSCOPY  11/28/2008   Dr. Rourk:internal hemorrhoids/tortus colon/ascending colon polyps, tubular adenomas  . COLONOSCOPY N/A 09/04/2014   Procedure: COLONOSCOPY;  Surgeon: Daneil Dolin, MD;  [facility];  Service: Endoscopy;  Laterality: N/A;  9:30  . COLONOSCOPY N/A 07/17/2017   Procedure: COLONOSCOPY;  Surgeon: Danie Binder, MD;  [facility];  Service: Endoscopy;  Laterality: N/A;  . ESOPHAGOGASTRODUODENOSCOPY N/A 07/16/2017   Procedure: ESOPHAGOGASTRODUODENOSCOPY (EGD);  Surgeon: Danie Binder, MD;  [facility];  Service: Endoscopy;  Laterality: N/A;  . GIVENS CAPSULE STUDY  07/16/2017   Procedure: GIVENS CAPSULE STUDY;  Surgeon: Danie Binder, MD;  [facility];  Service: Endoscopy;;  . POLYPECTOMY  07/17/2017  Procedure: POLYPECTOMY;  Surgeon: Danie Binder, MD;  [facility];  Service: Endoscopy;;  cecal  . TOTAL KNEE ARTHROPLASTY Right 06/10/2017  . TOTAL KNEE ARTHROPLASTY Right 06/10/2017   Procedure: TOTAL KNEE ARTHROPLASTY;  Surgeon: [name], MD;  Location: [place];  Service: Orthopedics;  Laterality: Right;    Current Outpatient Medications  Medication Sig Dispense Refill  .  acetaminophen (TYLENOL) 500 MG tablet Take 1,000 mg by mouth every 6 (six) hours as needed for mild pain or moderate pain.    . Cholecalciferol (VITAMIN D) 2000 units CAPS Take 2,000 Units by mouth 3 (three) times a week.     . Multiple Vitamin (MULTIVITAMIN WITH MINERALS) TABS tablet Take 1 tablet by mouth daily. Centrum Silver for Men    . pantoprazole (PROTONIX) 40 MG tablet Take 40 mg daily by mouth.     No current facility-administered medications for this visit.     Allergies as of 04/15/2018 - Review Complete 04/15/2018  Allergen Reaction Noted  . Sulfa antibiotics Other (See Comments) 05/26/2017    Family History  Problem Relation Age of Onset  . Diabetes Mother   . Hypertension Mother   . Obesity Mother   . Heart disease Mother   . Mental illness Mother   . Hypertension Sister   . Obesity Sister   . Arthritis Sister   . Deep vein thrombosis Father   . Dementia Father   . Heart disease Father        CABG  . Colon cancer Neg Hx     Social History   Socioeconomic History  . Marital status: Married    Spouse name: Not on file  . Number of children: Not on file  . Years of education: Not on file  . Highest education level: Not on file  Occupational History  . Occupation: Retired  Scientific laboratory technician  . Financial resource strain: Not on file  . Food insecurity:    Worry: Not on file    Inability: Not on file  . Transportation needs:    Medical: Not on file    Non-medical: Not on file  Tobacco Use  . Smoking status: Never Smoker  . Smokeless tobacco: Never Used  Substance and Sexual Activity  . Alcohol use: No  . Drug use: No  . Sexual activity: Yes  Lifestyle  . Physical activity:    Days per week: Not on file    Minutes per session: Not on file  . Stress: Not on file  Relationships  . Social connections:    Talks on phone: Not on file    Gets together: Not on file    Attends religious service: Not on file    Active member of club or organization: Not on  file    Attends meetings of clubs or organizations: Not on file    Relationship status: Not on file  Other Topics Concern  . Not on file  Social History Narrative  . Not on file    Review of Systems: General: Negative for anorexia, weight loss, fever, chills, fatigue, weakness. ENT: Negative for hoarseness, difficulty swallowing. CV: Negative for chest pain, angina, palpitations, peripheral edema.  Respiratory: Negative for dyspnea at rest, cough, sputum, wheezing.  GI: See history of present illness. Derm: Negative for rash or itching.  Neuro: Negative for weakness, abnormal sensation, seizure, frequent headaches, memory loss, confusion.  Psych: Negative for anxiety, depression, suicidal ideation, hallucinations.  Endo: Negative for unusual weight change.  Heme:  Negative for bruising or bleeding. Allergy: Negative for rash or hives.   Physical Exam: BP 133/75   Pulse 64   Temp (!) 97.1 F (36.2 C) (Oral)   Ht 6\' 2"  (1.88 m)   Wt 186 lb 12.8 oz (84.7 kg)   BMI 23.98 kg/m  General:   Alert and oriented. Pleasant and cooperative. Well-nourished and well-developed.  Eyes:  Without icterus, sclera clear and conjunctiva pink.  Ears:  Normal auditory acuity. Cardiovascular:  S1, S2 present without murmurs appreciated. Extremities without clubbing or edema. Respiratory:  Clear to auscultation bilaterally. No wheezes, rales, or rhonchi. No distress.  Gastrointestinal:  +BS, soft, non-tender and non-distended. No HSM noted. No guarding or rebound. No masses appreciated.  Rectal:  Deferred  Musculoskalatal:  Symmetrical without gross deformities. Skin:  Intact without significant lesions or rashes. Neurologic:  Alert and oriented x4;  grossly normal neurologically. Psych:  Alert and cooperative. Normal mood and affect. Heme/Lymph/Immune: No excessive bruising noted.    04/15/2018 8:20 AM   Disclaimer: This note was dictated with voice recognition software. Similar sounding  words can inadvertently be transcribed and may not be corrected upon review.

## 2018-04-16 ENCOUNTER — Ambulatory Visit (INDEPENDENT_AMBULATORY_CARE_PROVIDER_SITE_OTHER): Payer: Medicare HMO | Admitting: Family Medicine

## 2018-04-16 ENCOUNTER — Other Ambulatory Visit: Payer: Self-pay

## 2018-04-16 ENCOUNTER — Encounter: Payer: Self-pay | Admitting: Family Medicine

## 2018-04-16 VITALS — BP 130/64 | HR 62 | Temp 98.1°F | Resp 12 | Ht 74.0 in | Wt 186.0 lb

## 2018-04-16 DIAGNOSIS — Z86718 Personal history of other venous thrombosis and embolism: Secondary | ICD-10-CM

## 2018-04-16 DIAGNOSIS — C61 Malignant neoplasm of prostate: Secondary | ICD-10-CM

## 2018-04-16 DIAGNOSIS — J302 Other seasonal allergic rhinitis: Secondary | ICD-10-CM

## 2018-04-16 DIAGNOSIS — K219 Gastro-esophageal reflux disease without esophagitis: Secondary | ICD-10-CM | POA: Diagnosis not present

## 2018-04-16 LAB — CBC WITH DIFFERENTIAL/PLATELET
BASOS ABS: 20 {cells}/uL (ref 0–200)
Basophils Relative: 0.6 %
EOS ABS: 122 {cells}/uL (ref 15–500)
Eosinophils Relative: 3.6 %
HEMATOCRIT: 37.9 % — AB (ref 38.5–50.0)
HEMOGLOBIN: 12.6 g/dL — AB (ref 13.2–17.1)
LYMPHS ABS: 1479 {cells}/uL (ref 850–3900)
MCH: 28.1 pg (ref 27.0–33.0)
MCHC: 33.2 g/dL (ref 32.0–36.0)
MCV: 84.6 fL (ref 80.0–100.0)
MPV: 10.9 fL (ref 7.5–12.5)
Monocytes Relative: 9.2 %
Neutro Abs: 1465 cells/uL — ABNORMAL LOW (ref 1500–7800)
Neutrophils Relative %: 43.1 %
Platelets: 217 10*3/uL (ref 140–400)
RBC: 4.48 10*6/uL (ref 4.20–5.80)
RDW: 15.6 % — ABNORMAL HIGH (ref 11.0–15.0)
Total Lymphocyte: 43.5 %
WBC mixed population: 313 cells/uL (ref 200–950)
WBC: 3.4 10*3/uL — ABNORMAL LOW (ref 3.8–10.8)

## 2018-04-16 LAB — BASIC METABOLIC PANEL
BUN: 14 mg/dL (ref 7–25)
CALCIUM: 9.2 mg/dL (ref 8.6–10.3)
CO2: 30 mmol/L (ref 20–32)
Chloride: 106 mmol/L (ref 98–110)
Creat: 1.2 mg/dL (ref 0.70–1.25)
Glucose, Bld: 88 mg/dL (ref 65–99)
POTASSIUM: 4.5 mmol/L (ref 3.5–5.3)
SODIUM: 141 mmol/L (ref 135–146)

## 2018-04-16 NOTE — Assessment & Plan Note (Signed)
Keep diary of when he gets the chest discomfort which appears to be GI related Continue PPI her GI

## 2018-04-16 NOTE — Assessment & Plan Note (Signed)
Off of blood thinners, DVT/PE s/p surgery Advised he can stop use of the TED compression hose Use for any travel > 4 hours away or flying

## 2018-04-16 NOTE — Progress Notes (Signed)
   Subjective:    Patient ID: Luis Stewart, male    DOB: 12/05/1951, 67 y.o.   MRN: [id number]  Patient presents for Follow-up (is fasting) Here to follow-up chronic medical problems.  Medications reviewed Seen by gastroenterology yesterday continued on his Protonix no other changes made.  Followed by urology he is on prostate cancer surveillance with his elevated PSA by [facility]. Due for prostate Biopsy and MRI in June    Pulmonary followed once a year for asbetsos exposure had Clear CT scan in October   Still gets intermittant chest pain, he does have indigestion per above, had neg stress testing  Seasonal allergies- mild right now, has some sneezing, using air purifier  Last set of labs Cr 1.30 due for repeat   Review Of Systems:  GEN- denies fatigue, fever, weight loss,weakness, recent illness HEENT- denies eye drainage, change in vision, nasal discharge, CVS- denies chest pain, palpitations RESP- denies SOB, cough, wheeze ABD- denies N/V, change in stools, abd pain GU- denies dysuria, hematuria, dribbling, incontinence MSK- denies joint pain, muscle aches, injury Neuro- denies headache, dizziness, syncope, seizure activity       Objective:    BP 130/64   Pulse 62   Temp 98.1 F (36.7 C) (Oral)   Resp 12   Ht 6\' 2"  (1.88 m)   Wt 186 lb (84.4 kg)   SpO2 96%   BMI 23.88 kg/m  GEN- NAD, alert and oriented x3 HEENT- PERRL, EOMI, non injected sclera, pink conjunctiva, MMM, oropharynx clear Neck- Supple, no thyromegaly CVS- RRR, no murmur RESP-CTAB ABD-NABS,soft,NT,ND EXT- No edema Pulses- Radial 2+        Assessment & Plan:      Problem List Items Addressed This Visit      Unprioritized   Prostate cancer ([place])    Recheck labs, f/u urology for MRI/biopsy      Relevant Orders   CBC with Differential/Platelet   Basic metabolic panel   History of DVT (deep vein thrombosis)    Off of blood thinners, DVT/PE s/p surgery Advised he can stop  use of the TED compression hose Use for any travel > 4 hours away or flying        GERD (gastroesophageal reflux disease) - Primary    Keep diary of when he gets the chest discomfort which appears to be GI related Continue PPI her GI       Relevant Orders   CBC with Differential/Platelet    Other Visit Diagnoses    Seasonal allergies       prn OTC anti-histamine such as claritin or zyrtec       Note: This dictation was prepared with Dragon dictation along with smaller phrase technology. Any transcriptional errors that result from this process are unintentional.

## 2018-04-16 NOTE — Assessment & Plan Note (Signed)
Recheck labs, f/u urology for MRI/biopsy

## 2018-04-16 NOTE — Patient Instructions (Addendum)
F/U November for Physical  We will call with lab results Okay to use claritin

## 2018-04-19 DIAGNOSIS — C61 Malignant neoplasm of prostate: Secondary | ICD-10-CM | POA: Diagnosis not present

## 2018-05-26 DIAGNOSIS — R69 Illness, unspecified: Secondary | ICD-10-CM | POA: Diagnosis not present

## 2018-06-02 DIAGNOSIS — M1711 Unilateral primary osteoarthritis, right knee: Secondary | ICD-10-CM | POA: Diagnosis not present

## 2018-06-22 ENCOUNTER — Encounter (HOSPITAL_COMMUNITY): Payer: Self-pay | Admitting: Emergency Medicine

## 2018-06-22 ENCOUNTER — Other Ambulatory Visit: Payer: Self-pay

## 2018-06-22 ENCOUNTER — Emergency Department (HOSPITAL_COMMUNITY)
Admission: EM | Admit: 2018-06-22 | Discharge: 2018-06-23 | Disposition: A | Discharge status: Home / Self Care | Payer: Medicare HMO | Attending: Emergency Medicine | Admitting: Emergency Medicine

## 2018-06-22 ENCOUNTER — Emergency Department (HOSPITAL_COMMUNITY): Payer: Medicare HMO

## 2018-06-22 DIAGNOSIS — R079 Chest pain, unspecified: Secondary | ICD-10-CM | POA: Diagnosis not present

## 2018-06-22 DIAGNOSIS — Z8546 Personal history of malignant neoplasm of prostate: Secondary | ICD-10-CM | POA: Diagnosis not present

## 2018-06-22 DIAGNOSIS — Z96651 Presence of right artificial knee joint: Secondary | ICD-10-CM | POA: Insufficient documentation

## 2018-06-22 DIAGNOSIS — Z79899 Other long term (current) drug therapy: Secondary | ICD-10-CM | POA: Insufficient documentation

## 2018-06-22 DIAGNOSIS — R072 Precordial pain: Secondary | ICD-10-CM | POA: Diagnosis not present

## 2018-06-22 LAB — CBC
HCT: 40.2 % (ref 39.0–52.0)
Hemoglobin: 13.4 g/dL (ref 13.0–17.0)
MCH: 28.7 pg (ref 26.0–34.0)
MCHC: 33.3 g/dL (ref 30.0–36.0)
MCV: 86.1 fL (ref 78.0–100.0)
PLATELETS: 177 10*3/uL (ref 150–400)
RBC: 4.67 MIL/uL (ref 4.22–5.81)
RDW: 15.3 % (ref 11.5–15.5)
WBC: 4.4 10*3/uL (ref 4.0–10.5)

## 2018-06-22 LAB — BASIC METABOLIC PANEL
Anion gap: 6 (ref 5–15)
BUN: 20 mg/dL (ref 8–23)
CALCIUM: 9.2 mg/dL (ref 8.9–10.3)
CO2: 28 mmol/L (ref 22–32)
CREATININE: 1.21 mg/dL (ref 0.61–1.24)
Chloride: 106 mmol/L (ref 98–111)
GFR calc non Af Amer: >60 mL/min (ref >60)
Glucose, Bld: 93 mg/dL (ref 70–99)
Potassium: 4.4 mmol/L (ref 3.5–5.1)
SODIUM: 140 mmol/L (ref 135–145)

## 2018-06-22 LAB — TROPONIN I: Troponin I: <0.03 ng/mL (ref <0.03)

## 2018-06-22 MED ORDER — KETOROLAC TROMETHAMINE 30 MG/ML IJ SOLN
15.0000 mg | Freq: Once | INTRAMUSCULAR | Status: AC
  Administered 2018-06-22: 15 mg via INTRAVENOUS
  Filled 2018-06-22: qty 1

## 2018-06-22 NOTE — ED Notes (Signed)
Patient ambulated around nursing station without any difficulties oxygen remained between 99-100%.

## 2018-06-22 NOTE — ED Triage Notes (Signed)
Pt c/o central chest pain all day.

## 2018-06-23 NOTE — ED Provider Notes (Signed)
[facility] EMERGENCY DEPARTMENT Provider Note   CSN: [id number] Arrival date & time: 06/22/18  1957     History   Chief Complaint Chief Complaint  Patient presents with  . Chest Pain    HPI Luis Stewart is a 67 y.o. male.   Chest Pain   This is a new problem. The current episode started 6 to 12 hours ago. The problem occurs constantly. The problem has not changed since onset.The pain is present in the substernal region (and right sided). The pain is moderate.    Past Medical History:  Diagnosis Date  . Arthritis   . Asbestosis ([place])   . Cyst of kidney, acquired   . Headache   . History of fracture of leg    Right, childhood  . Hx of migraine headaches   . Hyperlipidemia   . Neuropathy   . PE (pulmonary thromboembolism) ([place])    After knee surgery  . Prostate cancer ([place]) 2015   Adenocarcinoma by biopsy    Patient Active Problem List   Diagnosis Date Noted  . History of DVT (deep vein thrombosis) 04/16/2018  . GERD (gastroesophageal reflux disease) 10/15/2017  . Pulmonary nodule 07/19/2017  . Heme + stool 07/15/2017  . Constipation 07/15/2017  . Melena 07/15/2017  . History of pulmonary embolism 06/18/2017  . Primary localized osteoarthritis of right knee 06/10/2017  . Upper airway cough syndrome 08/29/2015  . Prostate cancer ([place]) 08/17/2015  . Exposure to asbestos 08/17/2015  . Personal history of colonic polyps 08/08/2014  . Joint pain 06/05/2014  . Vitamin D deficiency 06/05/2014  . BPH (benign prostatic hypertrophy) 06/01/2013  . Mild hyperlipidemia 06/01/2013  . Routine general medical examination at a health care facility 06/01/2013    Past Surgical History:  Procedure Laterality Date  . BIOPSY PROSTATE  2003 and 2005  . COLONOSCOPY  11/28/2008   Dr. Rourk:internal hemorrhoids/tortus colon/ascending colon polyps, tubular adenomas  . COLONOSCOPY N/A 09/04/2014   Procedure: COLONOSCOPY;  Surgeon: Daneil Dolin, MD;  [facility];  Service: Endoscopy;  Laterality: N/A;  9:30  . COLONOSCOPY N/A 07/17/2017   Procedure: COLONOSCOPY;  Surgeon: Danie Binder, MD;  [facility];  Service: Endoscopy;  Laterality: N/A;  . ESOPHAGOGASTRODUODENOSCOPY N/A 07/16/2017   Procedure: ESOPHAGOGASTRODUODENOSCOPY (EGD);  Surgeon: Danie Binder, MD;  [facility];  Service: Endoscopy;  Laterality: N/A;  . GIVENS CAPSULE STUDY  07/16/2017   Procedure: GIVENS CAPSULE STUDY;  Surgeon: Danie Binder, MD;  [facility];  Service: Endoscopy;;  . POLYPECTOMY  07/17/2017   Procedure: POLYPECTOMY;  Surgeon: Danie Binder, MD;  [facility];  Service: Endoscopy;;  cecal  . TOTAL KNEE ARTHROPLASTY Right 06/10/2017  . TOTAL KNEE ARTHROPLASTY Right 06/10/2017   Procedure: TOTAL KNEE ARTHROPLASTY;  Surgeon: [name], MD;  Location: [place];  Service: Orthopedics;  Laterality: Right;        Home Medications    Prior to Admission medications   Medication Sig Start Date End Date Taking? Authorizing Provider  acetaminophen (TYLENOL) 500 MG tablet Take 1,000 mg by mouth every 6 (six) hours as needed for mild pain or moderate pain.    [provider]  Cholecalciferol (VITAMIN D) 2000 units CAPS Take 2,000 Units by mouth 3 (three) times a week.     [provider]  Multiple Vitamin (MULTIVITAMIN WITH MINERALS) TABS tablet Take 1 tablet by mouth daily. Centrum Silver for Men  [provider]  pantoprazole (PROTONIX) 40 MG tablet Take 40 mg daily by mouth.    [provider]    Family History Family History  Problem Relation Age of Onset  . Diabetes Mother   . Hypertension Mother   . Obesity Mother   . Heart disease Mother   . Mental illness Mother   . Hypertension Sister   . Obesity Sister   . Arthritis Sister   . Deep vein thrombosis Father   . Dementia Father   . Heart disease Father        CABG  . Colon cancer Neg Hx     Social  History Social History   Tobacco Use  . Smoking status: Never Smoker  . Smokeless tobacco: Never Used  Substance Use Topics  . Alcohol use: No  . Drug use: No     Allergies   Sulfa antibiotics   Review of Systems Review of Systems  Cardiovascular: Positive for chest pain.  All other systems reviewed and are negative.    Physical Exam Updated Vital Signs BP 132/82   Pulse (!) 56   Temp 98.8 F (37.1 C) (Oral)   Resp 15   Ht 6\' 2"  (1.88 m)   Wt 82.6 kg (182 lb)   SpO2 98%   BMI 23.37 kg/m   Physical Exam  Constitutional: He appears well-developed and well-nourished.  HENT:  Head: Normocephalic and atraumatic.  Eyes: Conjunctivae and EOM are normal.  Neck: Normal range of motion.  Cardiovascular: Normal rate.  Pulmonary/Chest: Effort normal. No respiratory distress.  Abdominal: Soft. Bowel sounds are normal. He exhibits no distension.  Musculoskeletal: Normal range of motion.  Neurological: He is alert.  Skin: Skin is warm and dry.  Nursing note and vitals reviewed.    ED Treatments / Results  Labs (all labs ordered are listed, but only abnormal results are displayed) Labs Reviewed  BASIC METABOLIC PANEL  CBC  TROPONIN I  TROPONIN I    EKG EKG Interpretation  Date/Time:  Tuesday June 22 2018 20:05:46 EDT Ventricular Rate:  61 PR Interval:  188 QRS Duration: 92 QT Interval:  382 QTC Calculation: 384 R Axis:   79 Text Interpretation:  Normal sinus rhythm Incomplete right bundle branch block Borderline ECG mostly similar to october 2018 Confirmed by Merrily Pew [phone]) on 06/22/2018 8:09:21 PM   Radiology Dg Chest 2 View  Result Date: 06/22/2018 CLINICAL DATA:  Chest pain EXAM: CHEST - 2 VIEW COMPARISON:  10/20/2016 FINDINGS: Hyperinflation. No focal opacity or pleural effusion. Stable cardiomediastinal silhouette. No pneumothorax. IMPRESSION: No active cardiopulmonary disease.  Hyperinflation Electronically Signed   By: Donavan Foil M.D.   On:  06/22/2018 20:26    Procedures Procedures (including critical care time)  Medications Ordered in ED Medications  ketorolac (TORADOL) 30 MG/ML injection 15 mg (15 mg Intravenous Given 06/22/18 2243)     Initial Impression / Assessment and Plan / ED Course  I have reviewed the triage vital signs and the nursing notes.  Pertinent labs & imaging results that were available during my care of the patient were reviewed by me and considered in my medical decision making (see chart for details).     Mild substernal and right-sided chest pain.  Does not seem to be exertionally related.  Delta runs were negative making ACS unlikely and is not a typical story for that either with a low heart score of 2 affect this is appropriate work-up to rule out ACS at this time.  Does have history of blood clot however he is not having any shortness of breath, tachycardia, hypoxia or tachypnea.  He ambulated without difficulty or shortness of breath either.  No other evidence of DVTs I think this is unlikely as well.  May just be muscular skeletal got better with anti-inflammatories he will continue those at home and follow-up with his doctor as needed  Final Clinical Impressions(s) / ED Diagnoses   Final diagnoses:  Nonspecific chest pain    ED Discharge Orders    None       Haevyn Ury, Corene Cornea, MD 06/23/18 [phone]

## 2018-06-29 ENCOUNTER — Encounter (HOSPITAL_COMMUNITY): Payer: Self-pay | Admitting: Emergency Medicine

## 2018-06-29 ENCOUNTER — Emergency Department (HOSPITAL_COMMUNITY)
Admission: EM | Admit: 2018-06-29 | Discharge: 2018-06-29 | Disposition: A | Discharge status: Home / Self Care | Payer: Medicare HMO | Attending: Emergency Medicine | Admitting: Emergency Medicine

## 2018-06-29 ENCOUNTER — Other Ambulatory Visit: Payer: Self-pay

## 2018-06-29 ENCOUNTER — Emergency Department (HOSPITAL_COMMUNITY)
Admission: EM | Admit: 2018-06-29 | Discharge: 2018-06-29 | Disposition: A | Discharge status: Home / Self Care | Payer: Medicare HMO | Source: Home / Self Care | Attending: Emergency Medicine | Admitting: Emergency Medicine

## 2018-06-29 ENCOUNTER — Emergency Department (HOSPITAL_COMMUNITY): Payer: Medicare HMO

## 2018-06-29 DIAGNOSIS — Z5321 Procedure and treatment not carried out due to patient leaving prior to being seen by health care provider: Secondary | ICD-10-CM | POA: Diagnosis not present

## 2018-06-29 DIAGNOSIS — R079 Chest pain, unspecified: Secondary | ICD-10-CM | POA: Diagnosis not present

## 2018-06-29 DIAGNOSIS — Z79899 Other long term (current) drug therapy: Secondary | ICD-10-CM | POA: Insufficient documentation

## 2018-06-29 DIAGNOSIS — R0789 Other chest pain: Secondary | ICD-10-CM

## 2018-06-29 LAB — TROPONIN I

## 2018-06-29 NOTE — Discharge Instructions (Addendum)
I have reviewed your chest x-ray from your previous visit and it was negative for acute problems.  Your heart enzyme was negative for acute cardiac event earlier.  A second heart enzyme was obtained tonight and it too is negative.  I suspect that you have musculoskeletal pain involving your sternum and the left chest.  Please return to the emergency department immediately if any change in your condition, change in the pain, difficulty with breathing, problems, or complications.
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
## 2018-06-29 NOTE — ED Notes (Signed)
Patient states he has to leave and go get the keys to his truck. States he will return after he gets his keys. NAD noted, no complaints at this time.
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
## 2018-06-30 ENCOUNTER — Other Ambulatory Visit: Payer: Self-pay | Admitting: Family Medicine

## 2018-06-30 ENCOUNTER — Other Ambulatory Visit: Payer: Self-pay | Admitting: Urology

## 2018-06-30 DIAGNOSIS — C61 Malignant neoplasm of prostate: Secondary | ICD-10-CM

## 2018-07-07 ENCOUNTER — Encounter: Payer: Self-pay | Admitting: Family Medicine

## 2018-07-07 ENCOUNTER — Other Ambulatory Visit: Payer: Self-pay

## 2018-07-07 ENCOUNTER — Ambulatory Visit (INDEPENDENT_AMBULATORY_CARE_PROVIDER_SITE_OTHER): Payer: Medicare HMO | Admitting: Family Medicine

## 2018-07-07 VITALS — BP 120/68 | HR 64 | Temp 98.2°F | Resp 14 | Ht 74.0 in | Wt 184.0 lb

## 2018-07-07 DIAGNOSIS — R0789 Other chest pain: Secondary | ICD-10-CM

## 2018-07-07 DIAGNOSIS — R7989 Other specified abnormal findings of blood chemistry: Secondary | ICD-10-CM | POA: Diagnosis not present
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
## 2018-07-07 NOTE — Patient Instructions (Signed)
F/u AS PREVIOUS Okay to take tylenol

## 2018-07-08 ENCOUNTER — Telehealth: Payer: Self-pay | Admitting: *Deleted

## 2018-07-08 ENCOUNTER — Ambulatory Visit (HOSPITAL_COMMUNITY)
Admission: RE | Admit: 2018-07-08 | Discharge: 2018-07-08 | Disposition: A | Discharge status: Home / Self Care | Payer: Medicare HMO | Source: Ambulatory Visit | Attending: Family Medicine | Admitting: Family Medicine

## 2018-07-08 ENCOUNTER — Encounter (HOSPITAL_COMMUNITY): Payer: Self-pay

## 2018-07-08 ENCOUNTER — Encounter: Payer: Self-pay | Admitting: Family Medicine

## 2018-07-08 DIAGNOSIS — R7989 Other specified abnormal findings of blood chemistry: Secondary | ICD-10-CM | POA: Diagnosis not present

## 2018-07-08 DIAGNOSIS — R079 Chest pain, unspecified: Secondary | ICD-10-CM | POA: Diagnosis not present

## 2018-07-08 DIAGNOSIS — R0789 Other chest pain: Secondary | ICD-10-CM | POA: Diagnosis present

## 2018-07-08 DIAGNOSIS — S299XXA Unspecified injury of thorax, initial encounter: Secondary | ICD-10-CM | POA: Diagnosis not present

## 2018-07-08 LAB — D-DIMER, QUANTITATIVE: D-Dimer, Quant: 0.95 mcg/mL FEU — ABNORMAL HIGH (ref <0.50)

## 2018-07-08 MED ORDER — IOPAMIDOL (ISOVUE-370) INJECTION 76%
75.0000 mL | Freq: Once | INTRAVENOUS | Status: AC | PRN
  Administered 2018-07-08: 15:00:00 via INTRAVENOUS

## 2018-07-08 NOTE — Telephone Encounter (Signed)
Received call from [name] Radiology.   Reports that Chest CT shows no PE/dissection/ acute findings. Patient made aware and advised that call will be made with provider recommendations at later time.   MD to be made aware.

## 2018-07-09 NOTE — Telephone Encounter (Signed)
See CT results.  

## 2018-07-23 ENCOUNTER — Ambulatory Visit
Admission: RE | Admit: 2018-07-23 | Discharge: 2018-07-23 | Disposition: A | Discharge status: Home / Self Care | Payer: Medicare HMO | Source: Ambulatory Visit | Attending: Urology | Admitting: Urology

## 2018-07-23 DIAGNOSIS — C61 Malignant neoplasm of prostate: Secondary | ICD-10-CM

## 2018-07-23 MED ORDER — GADOBENATE DIMEGLUMINE 529 MG/ML IV SOLN
17.0000 mL | Freq: Once | INTRAVENOUS | Status: AC | PRN
  Administered 2018-07-23: 17 mL via INTRAVENOUS

## 2018-08-11 DIAGNOSIS — C61 Malignant neoplasm of prostate: Secondary | ICD-10-CM | POA: Diagnosis not present

## 2018-08-14 ENCOUNTER — Encounter (HOSPITAL_COMMUNITY): Payer: Self-pay | Admitting: Emergency Medicine

## 2018-08-14 ENCOUNTER — Emergency Department (HOSPITAL_COMMUNITY): Payer: Medicare HMO

## 2018-08-14 ENCOUNTER — Emergency Department (HOSPITAL_COMMUNITY)
Admission: EM | Admit: 2018-08-14 | Discharge: 2018-08-14 | Disposition: A | Discharge status: Home / Self Care | Payer: Medicare HMO | Attending: Emergency Medicine | Admitting: Emergency Medicine

## 2018-08-14 ENCOUNTER — Other Ambulatory Visit: Payer: Self-pay

## 2018-08-14 DIAGNOSIS — R079 Chest pain, unspecified: Secondary | ICD-10-CM | POA: Insufficient documentation

## 2018-08-14 DIAGNOSIS — Z8546 Personal history of malignant neoplasm of prostate: Secondary | ICD-10-CM | POA: Insufficient documentation

## 2018-08-14 DIAGNOSIS — Z96651 Presence of right artificial knee joint: Secondary | ICD-10-CM | POA: Insufficient documentation

## 2018-08-14 DIAGNOSIS — R0789 Other chest pain: Secondary | ICD-10-CM | POA: Diagnosis not present

## 2018-08-14 DIAGNOSIS — Z79899 Other long term (current) drug therapy: Secondary | ICD-10-CM | POA: Insufficient documentation

## 2018-08-14 LAB — CBC WITH DIFFERENTIAL/PLATELET
Basophils Absolute: 0 10*3/uL (ref 0.0–0.1)
Basophils Relative: 0 %
EOS ABS: 0.1 10*3/uL (ref 0.0–0.7)
Eosinophils Relative: 2 %
HCT: 39.4 % (ref 39.0–52.0)
HEMOGLOBIN: 13.3 g/dL (ref 13.0–17.0)
Lymphocytes Relative: 26 %
Lymphs Abs: 1.2 10*3/uL (ref 0.7–4.0)
MCH: 29 pg (ref 26.0–34.0)
MCHC: 33.8 g/dL (ref 30.0–36.0)
MCV: 85.8 fL (ref 78.0–100.0)
MONOS PCT: 6 %
Monocytes Absolute: 0.3 10*3/uL (ref 0.1–1.0)
NEUTROS PCT: 66 %
Neutro Abs: 3 10*3/uL (ref 1.7–7.7)
Platelets: 168 10*3/uL (ref 150–400)
RBC: 4.59 MIL/uL (ref 4.22–5.81)
RDW: 15.3 % (ref 11.5–15.5)
WBC: 4.5 10*3/uL (ref 4.0–10.5)

## 2018-08-14 LAB — COMPREHENSIVE METABOLIC PANEL
ALK PHOS: 121 U/L (ref 38–126)
ALT: 16 U/L (ref 0–44)
AST: 22 U/L (ref 15–41)
Albumin: 3.9 g/dL (ref 3.5–5.0)
Anion gap: 7 (ref 5–15)
BILIRUBIN TOTAL: 1.4 mg/dL — AB (ref 0.3–1.2)
BUN: 17 mg/dL (ref 8–23)
CALCIUM: 9.3 mg/dL (ref 8.9–10.3)
CO2: 29 mmol/L (ref 22–32)
Chloride: 103 mmol/L (ref 98–111)
Creatinine, Ser: 1.22 mg/dL (ref 0.61–1.24)
GFR calc non Af Amer: 60 mL/min — ABNORMAL LOW (ref >60)
Glucose, Bld: 131 mg/dL — ABNORMAL HIGH (ref 70–99)
Potassium: 4.1 mmol/L (ref 3.5–5.1)
SODIUM: 139 mmol/L (ref 135–145)
TOTAL PROTEIN: 8.1 g/dL (ref 6.5–8.1)

## 2018-08-14 LAB — TROPONIN I

## 2018-08-14 MED ORDER — ONDANSETRON 4 MG PO TBDP: 4.0000 mg | ORAL_TABLET | Freq: Three times a day (TID) | ORAL | 0 refills | Status: DC | PRN

## 2018-08-14 MED ORDER — ASPIRIN 81 MG PO CHEW
324.0000 mg | CHEWABLE_TABLET | Freq: Once | ORAL | Status: AC
  Administered 2018-08-14: 324 mg via ORAL
  Filled 2018-08-14: qty 4

## 2018-08-14 MED ORDER — NITROGLYCERIN 0.4 MG SL SUBL: 0.4000 mg | SUBLINGUAL_TABLET | SUBLINGUAL | 0 refills | Status: DC | PRN

## 2018-08-14 NOTE — ED Triage Notes (Signed)
Pt reports central CP intermittently since yesterday AM with some nausea, SHOB, and dizziness. Pt reports hx of same without significant cardiac hx. Pt reports strong family cardiac hx.

## 2018-08-14 NOTE — Discharge Instructions (Signed)
Your testing here was unremarkable, I have given you a prescription for nitroglycerin and should you develop increasing pain, please take this if that occurs, you should use Zofran as needed for nausea and please call the cardiology office on Monday morning to arrange a follow-up.  They may need to consider doing other testing to make sure you do not have obstructive heart disease though up to this point they have not been able to find a cause for your pain.  Emergency department for severe or worsening symptoms.

## 2018-08-14 NOTE — ED Notes (Signed)
Pt states his chest pain is intermittent and positional
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
## 2018-08-18 ENCOUNTER — Ambulatory Visit (INDEPENDENT_AMBULATORY_CARE_PROVIDER_SITE_OTHER): Payer: Medicare HMO | Admitting: Physician Assistant

## 2018-08-18 ENCOUNTER — Encounter: Payer: Self-pay | Admitting: Physician Assistant

## 2018-08-18 ENCOUNTER — Other Ambulatory Visit: Payer: Self-pay

## 2018-08-18 VITALS — BP 134/78 | HR 74 | Temp 98.7°F | Resp 18 | Ht 74.0 in | Wt 180.8 lb

## 2018-08-18 DIAGNOSIS — R110 Nausea: Secondary | ICD-10-CM

## 2018-08-18 DIAGNOSIS — R0789 Other chest pain: Secondary | ICD-10-CM

## 2018-08-18 DIAGNOSIS — R42 Dizziness and giddiness: Secondary | ICD-10-CM | POA: Diagnosis not present

## 2018-08-18 NOTE — Progress Notes (Signed)
Patient ID: Luis Stewart MRN: [id number], DOB: 1951-08-12, 67 y.o. Date of Encounter: @DATE @  Chief Complaint:  Chief Complaint  Patient presents with  . ER follow up    HPI: 67 y.o. year old male  presents for f/u after recent ER vistit.  Today I have reviewed his chart. Reviewed that he has had multiple ER visits as well as one visit here regarding chest pain and these current symptoms. He had a ER visit for chest pain on 06/22/2018. He had another ER visit regarding chest pain symptoms on 06/29/2018. He had visit here with Dr. Buelah Manis on 07/07/2018 regarding these chest pain symptoms. He had another visit to the ER on 08/14/2018 secondary to chest pain symptoms.  Throughout this time he has had EKG and cardiac enzymes that have been negative. Has had chest x-rays that have been negative. When he saw Dr. Buelah Manis for visit on 07/07/2018 she obtained a d-dimer which was positive so she then obtained CTA NGO chest which was performed 07/08/2018.  That was negative for acute PE, thoracic aortic dissection, or other acute findings.  Also he underwent myocardial perfusion scan 11/04/2017.  The study was normal.  Showed no ischemia or scar.  EF 67%.  I reviewed the ER note dated 08/14/2018. That note references all of the above information and recent testing that has been performed. They felt that he should have follow-up with cardiology and that he may ultimately benefit from a cardiac catheterization if they deem that necessary.  Spelt that at that time symptoms not consistent with exercise-induced ischemia or classic coronary disease.  ER note 08/14/2018 states that "he continues to have intermittent chest pain which is associated with nausea and shortness of breath.  It is not exertional or positional, comes on at random times.  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  He is having left upper chest discomfort with left side of the neck."  Today I verified additional information. He reports that he is taking the Protonix daily.    (Doubt GERD) He reports that even if he were to eat a large meal, that after that he would not feel any of these symptoms. (Doubt GI - related) He reports that food is not getting stuck in his throat. (Doubt Esophageal stricture)  Today I did review that he has appointments scheduled for pulmonary function test and an appointment with pulmonologist.  I reviewed with him that given the fact that he can do significant physical exertion and not have the symptoms that it is unlikely that the symptoms would be coming from CAD. Again today he does report to me that he has recently had significant physical exertion and has had no symptoms with that exertion.  However, he reports that when he does feel the symptoms that it feels like a heaviness on his chest and that he feels lightheaded and nauseous.  States that when this happens he will take a few deep breaths and it will ease off.  He reports that after this last ER visit he did go home and call cardiology to schedule follow-up there but states that they were booked until September 23 so was scheduled for that date. Will go ahead and place referral to cardiology to see if they may be able to see him sooner to see if they feel that they should proceed with  cardiac cath or not.   Past Medical History:  Diagnosis Date  . Arthritis   . Asbestosis ([place])   . Cyst of kidney, acquired   . Headache   . History of fracture of leg    Right, childhood  . Hx of migraine headaches   . Hyperlipidemia   . Neuropathy   . PE (pulmonary thromboembolism) ([place])    After knee surgery  . 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 acetaminophen (TYLENOL) 500 MG tablet Take 1,000 mg by  mouth every 6 (six) hours as needed for mild pain or moderate pain.    . Cholecalciferol (VITAMIN D) 2000 units CAPS Take 2,000 Units by mouth 3 (three) times a week.     . Multiple Vitamin (MULTIVITAMIN WITH MINERALS) TABS tablet Take 1 tablet by mouth daily. Centrum Silver for Men    . nitroGLYCERIN (NITROSTAT) 0.4 MG SL tablet Place 1 tablet (0.4 mg total) under the tongue every 5 (five) minutes as needed for chest pain. 30 tablet 0  . ondansetron (ZOFRAN ODT) 4 MG disintegrating tablet Take 1 tablet (4 mg total) by mouth every 8 (eight) hours as needed for nausea. 10 tablet 0  . pantoprazole (PROTONIX) 40 MG tablet Take 40 mg daily by mouth.     No facility-administered medications prior to visit.     Allergies:  Allergies  Allergen Reactions  . Sulfa Antibiotics Other (See Comments)    Unknown reaction. Childhood reaction    Social History   Socioeconomic History  . Marital status: Married    Spouse name: Not on file  . Number of children: Not on file  . Years of education: Not on file  . Highest education level: Not on file  Occupational History  . Occupation: Retired  Scientific laboratory technician  . Financial resource strain: Not on file  . Food insecurity:    Worry: Not on file    Inability: Not on file  . Transportation needs:    Medical: Not on file    Non-medical: Not on file  Tobacco Use  . Smoking status: Never Smoker  . Smokeless tobacco: Never Used  Substance and Sexual Activity  . Alcohol use: No  . Drug use: No  . Sexual activity: Yes  Lifestyle  . Physical activity:    Days per week: Not on file    Minutes per session: Not on file  . Stress: Not on file  Relationships  . Social connections:    Talks on phone: Not on file    Gets together: Not on file    Attends religious service: Not on file    Active member of club or organization: Not on file    Attends meetings of clubs or organizations: Not on file    Relationship status: Not on file  . Intimate partner  violence:    Fear of current or ex partner: Not on file    Emotionally abused: Not on file    Physically abused: Not on file    Forced sexual activity: Not on file  Other Topics Concern  . Not on file  Social History Narrative  . Not on file    Family History  Problem Relation Age of Onset  . Diabetes Mother   . Hypertension Mother   . Obesity Mother   . Heart disease Mother   . Mental illness Mother   .  Hypertension Sister   . Obesity Sister   . Arthritis Sister   . Deep vein thrombosis Father   . Dementia Father   . Heart disease Father        CABG  . Colon cancer Neg Hx      Review of Systems:  See HPI for pertinent ROS. All other ROS negative.    Physical Exam: Blood pressure 134/78, pulse 74, temperature 98.7 F (37.1 C), temperature source Oral, resp. rate 18, height 6\' 2"  (1.88 m), weight 82 kg, SpO2 95 %., Body mass index is 23.21 kg/m. General: WNWD AAM. Appears in no acute distress. Neck: Supple. No thyromegaly. No lymphadenopathy. Lungs: Clear bilaterally to auscultation without wheezes, rales, or rhonchi. Breathing is unlabored. Heart: RRR with S1 S2. No murmurs, rubs, or gallops. Abdomen: Soft, non-tender, non-distended with normoactive bowel sounds. No hepatomegaly. No rebound/guarding. No obvious abdominal masses. Musculoskeletal:  Strength and tone normal for age. Extremities/Skin: Warm and dry.  Neuro: Alert and oriented X 3. Moves all extremities spontaneously. Gait is normal. CNII-XII grossly in tact. Psych:  Responds to questions appropriately with a normal affect.     ASSESSMENT AND PLAN:  67 y.o. year old male with   1. Chest discomfort He reports that after this last ER visit he did go home and call cardiology to schedule follow-up there but states that they were booked until September 23 so was scheduled for that date. Will go ahead and place referral to cardiology to see if they may be able to see him sooner to see if they feel that they  should proceed with cardiac cath or not. - Ambulatory referral to Cardiology  2. Nausea without vomiting He reports that after this last ER visit he did go home and call cardiology to schedule follow-up there but states that they were booked until September 23 so was scheduled for that date. Will go ahead and place referral to cardiology to see if they may be able to see him sooner to see if they feel that they should proceed with cardiac cath or not. - Ambulatory referral to Cardiology  3. Episodic lightheadedness He reports that after this last ER visit he did go home and call cardiology to schedule follow-up there but states that they were booked until September 23 so was scheduled for that date. Will go ahead and place referral to cardiology to see if they may be able to see him sooner to see if they feel that they should proceed with cardiac cath or not.  - Ambulatory referral to Cardiology   Signed, Olean Ree [place], [place], [facility] 08/18/2018 1:26 PM

## 2018-09-13 ENCOUNTER — Encounter

## 2018-09-13 ENCOUNTER — Encounter: Payer: Self-pay | Admitting: Physician Assistant

## 2018-09-13 ENCOUNTER — Ambulatory Visit: Payer: Medicare HMO | Admitting: Physician Assistant

## 2018-09-13 VITALS — BP 124/78 | HR 65 | Ht 74.0 in | Wt 185.0 lb

## 2018-09-13 DIAGNOSIS — E785 Hyperlipidemia, unspecified: Secondary | ICD-10-CM | POA: Diagnosis not present

## 2018-09-13 DIAGNOSIS — Z77090 Contact with and (suspected) exposure to asbestos: Secondary | ICD-10-CM

## 2018-09-13 DIAGNOSIS — K219 Gastro-esophageal reflux disease without esophagitis: Secondary | ICD-10-CM | POA: Diagnosis not present

## 2018-09-13 DIAGNOSIS — R0789 Other chest pain: Secondary | ICD-10-CM | POA: Diagnosis not present

## 2018-09-13 DIAGNOSIS — R079 Chest pain, unspecified: Secondary | ICD-10-CM | POA: Insufficient documentation

## 2018-09-13 MED ORDER — METOPROLOL TARTRATE 50 MG PO TABS: 50.0000 mg | ORAL_TABLET | Freq: Once | ORAL | 0 refills | Status: DC

## 2018-09-13 NOTE — Patient Instructions (Signed)
Medication Instructions:  Your physician recommends that you continue on your current medications as directed. Please refer to the Current Medication list given to you today.   Labwork: Your physician recommends that you return for lab work just before cardiac CT    Testing/Procedures: Your physician has requested that you have cardiac CT. Cardiac computed tomography (CT) is a painless test that uses an x-ray machine to take clear, detailed pictures of your heart. For further information please visit [email or web address]. Please follow instruction sheet as given.   Follow-Up: Your physician recommends that you schedule a follow-up appointment with Dr. Domenic Polite   Any Other Special Instructions Will Be Listed Below (If Applicable).     If you need a refill on your cardiac medications before your next appointment, please call your pharmacy.  Thank you for choosing [facility]!  Please arrive at the [facility] main entrance of [facility] at xx:xx AM (30-45 minutes prior to test start time)  [facility] [place], [place] [address] [phone]  Proceed to the [facility] Department (First Floor).  Please follow these instructions carefully (unless otherwise directed):  Hold all erectile dysfunction medications at least 48 hours prior to test.  On the Night Before the Test: . Be sure to Drink plenty of water. . Do not consume any caffeinated/decaffeinated beverages or chocolate 12 hours prior to your test. . Do not take any antihistamines 12 hours prior to your test. . If you take Metformin do not take 24 hours prior to test. . If the patient has contrast allergy: ? Patient will need a prescription for Prednisone and very clear instructions (as follows): 1. Prednisone 50 mg - take 13 hours prior to test 2. Take another Prednisone 50 mg 7 hours prior to test 3. Take another Prednisone 50 mg 1 hour prior to test 4. Take  Benadryl 50 mg 1 hour prior to test . Patient must complete all four doses of above prophylactic medications. . Patient will need a ride after test due to Benadryl.  On the Day of the Test: . Drink plenty of water. Do not drink any water within one hour of the test. . Do not eat any food 4 hours prior to the test. . You may take your regular medications prior to the test. . IF NOT ON BETA BLOCKER-Take 50 mg of Lopressor (metoprolol) one hour before test . HOLD Furosemide morning of the test.  After the Test: . Drink plenty of water. . After receiving IV contrast, you may experience a mild flushed feeling. This is normal. . On occasion, you may experience a mild rash up to 24 hours after the test. This is not dangerous. If this occurs, you can take Benadryl 25 mg and increase your fluid intake. . If you experience trouble breathing, this can be serious. If it is severe call 911 IMMEDIATELY. If it is mild, please call our office. . If you take any of these medications: Glipizide/Metformin, Avandament, Glucavance, please do not take 48 hours after completing test.
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
## 2018-10-21 ENCOUNTER — Ambulatory Visit: Payer: Self-pay | Admitting: Internal Medicine

## 2018-10-22 ENCOUNTER — Other Ambulatory Visit: Payer: Self-pay | Admitting: Internal Medicine

## 2018-10-22 DIAGNOSIS — R0600 Dyspnea, unspecified: Secondary | ICD-10-CM

## 2018-10-22 DIAGNOSIS — R05 Cough: Secondary | ICD-10-CM

## 2018-10-22 DIAGNOSIS — R058 Other specified cough: Secondary | ICD-10-CM

## 2018-10-25 ENCOUNTER — Encounter: Payer: Self-pay | Admitting: Internal Medicine

## 2018-10-25 ENCOUNTER — Ambulatory Visit (INDEPENDENT_AMBULATORY_CARE_PROVIDER_SITE_OTHER): Payer: Medicare HMO | Admitting: Internal Medicine

## 2018-10-25 DIAGNOSIS — Z77090 Contact with and (suspected) exposure to asbestos: Secondary | ICD-10-CM

## 2018-10-25 DIAGNOSIS — R05 Cough: Secondary | ICD-10-CM

## 2018-10-25 DIAGNOSIS — R0600 Dyspnea, unspecified: Secondary | ICD-10-CM | POA: Diagnosis not present

## 2018-10-25 DIAGNOSIS — R0789 Other chest pain: Secondary | ICD-10-CM | POA: Diagnosis not present

## 2018-10-25 DIAGNOSIS — R058 Other specified cough: Secondary | ICD-10-CM

## 2018-10-25 LAB — PULMONARY FUNCTION TEST
DL/VA % pred: 119 %
DL/VA: 5.75 ml/min/mmHg/L
DLCO UNC % PRED: 82 %
DLCO UNC: 30.96 ml/min/mmHg
FEF 25-75 PRE: 2.92 L/s
FEF 25-75 Post: 3.6 L/sec
FEF2575-%Change-Post: 23 %
FEF2575-%PRED-POST: 121 %
FEF2575-%PRED-PRE: 98 %
FEV1-%Change-Post: 3 %
FEV1-%PRED-POST: 94 %
FEV1-%Pred-Pre: 91 %
FEV1-Post: 3.24 L
FEV1-Pre: 3.13 L
FEV1FVC-%Change-Post: 6 %
FEV1FVC-%Pred-Pre: 102 %
FEV6-%CHANGE-POST: -2 %
FEV6-%PRED-POST: 89 %
FEV6-%Pred-Pre: 91 %
FEV6-PRE: 3.94 L
FEV6-Post: 3.85 L
FEV6FVC-%CHANGE-POST: 0 %
FEV6FVC-%PRED-PRE: 103 %
FEV6FVC-%Pred-Post: 103 %
FVC-%Change-Post: -2 %
FVC-%Pred-Post: 86 %
FVC-%Pred-Pre: 88 %
FVC-Post: 3.87 L
FVC-Pre: 3.97 L
PRE FEV1/FVC RATIO: 79 %
Post FEV1/FVC ratio: 84 %
Post FEV6/FVC ratio: 100 %
Pre FEV6/FVC Ratio: 99 %
RV % PRED: 73 %
RV: 1.94 L
TLC % pred: 79 %
TLC: 6.19 L

## 2018-10-25 LAB — BASIC METABOLIC PANEL
BUN / CREAT RATIO: 13 (calc) (ref 6–22)
BUN: 18 mg/dL (ref 7–25)
CALCIUM: 9.3 mg/dL (ref 8.6–10.3)
CHLORIDE: 105 mmol/L (ref 98–110)
CO2: 31 mmol/L (ref 20–32)
CREATININE: 1.39 mg/dL — AB (ref 0.70–1.25)
Glucose, Bld: 83 mg/dL (ref 65–139)
POTASSIUM: 4.3 mmol/L (ref 3.5–5.3)
Sodium: 140 mmol/L (ref 135–146)

## 2018-10-25 NOTE — Patient Instructions (Addendum)
Classic pain pattern suggests ibs:   with a very limited distribution of pain locations, daytime, not usually exacerbated by exercise  or coughing, worse in sitting position, frequently associated with generalized abd bloating, not as likely to be present supine due to the dome effect of the diaphragm which  is  canceled in that position. Frequently these patients have had multiple negative GI workups and CT scans.  Treatment consists of avoiding foods that cause gas (especially boiled eggs, mexcican food but especially  beans and undercooked vegetables like  spinach and some salads)  and citrucel 1 heaping tsp twice daily with a large glass of water.  Pain should improve w/in 2 weeks and if not then consider further GI work up (assuming your cardiac work up is negative)      Return for yearly evaluation if you wish but no further ct scans are needed from my perspective as the risk of radiation exposure is not worth it to your lung health

## 2018-10-25 NOTE — Progress Notes (Signed)
PFT done today. 

## 2018-10-25 NOTE — Progress Notes (Signed)
Subjective:   Patient ID: Luis Stewart, male    DOB: September 02, 1951    MRN: [id number]    Brief patient profile:  18  yobm from [place]  never smoker worked for [employer] until 2003 last exposed abestos around 1980 with abn cxr 2014 referred by Dr Luis Stewart to pulmonary clinic 08/28/2015 with rec from Luis Stewart to get pfts yearly.     History of Present Illness  08/28/2015 1st [facility] office visit/ Luis Stewart   Chief Complaint  Patient presents with  . Pulmonary Consult    Referred by Dr. Vic Stewart. Pt has hx of asbestos exposure. He states that he is overall doing well. He does have some prod cough with clear to yellow sputum.    eval by Dr Luis Stewart with ? increased int markings but no f/u ct to date  Am cough x decades/ sensation of pnds / has not tried otc's rec Please see patient coordinator before you leave today  to schedule HRCT chest and return here same day with pfts on return  Try zyrtec 10 mg at bedtime to see if your daily am cough is better if now I recommend For drainage take chlortrimeton (chlorpheniramine) 4 mg every 4 hours available over the counter (may cause drowsiness)    09/27/2015  f/u ov/Luis Stewart re: asbestos f/u  Chief Complaint  Patient presents with  . Follow-up    Hx of asbestos exposure. Pt states that hsi breathing is doing well. Pt c/o of occasional wet cough, yellow to clear mucus. Pt denies wheeze/CP/SOB.    Not limited by breathing from desired activities  rec Try zyrtec 10 mg at bedtime to see if your daily am cough is better if not I recommend For drainage take chlortrimeton (chlorpheniramine) 4 mg every 4 hours available over the counter (may cause drowsiness)    10/20/2016  f/u ov/Luis Stewart re: asbestos f/u  Chief Complaint  Patient presents with  . Follow-up    PFT's and cxr done today. He c/o soreness in his chest for the past year. He has had some PND and sore throat for the past day.   not using otc's as rec "too sleepy" but  actually did work  rec Return in one year for pfts and cxr  - call for earlier follow up for cough or breathing issues  Try clariton or allergra as needed for drainage but try not to clear throat  GERD diet    10/20/2017  f/u ov/Luis Stewart re: asbestos f/u  Chief Complaint  Patient presents with  . Follow-up    PFT today prior to visit. Pt has productive cough-clear. Pt has SOB, wheezing with cough. Pt had knee replacement in June 2018 had PE afterwards.  really not limited by breathing and R knee doing well.  No noct cough/ wheeze/ only sob if overdoes it Off anticoagulants since Sep 21 2017 Rec Protonix 40 mg be sure to Take 30-60 min before first meal of the day  GERD  Diet    10/25/2018  f/u ov/Luis Stewart re: asbestos exp last around 1980  Chief Complaint  Patient presents with  . Follow-up    PFT's done today. Breathing is overall doing well. He is coughing less.   Dyspnea:  Not limited by breathing from desired activities  But no aerobics Cough: none Sleeping: on side/ bed flat SABA use: none 02: none  Recurrent cp 2015 neg cardiac eval in past / started back up 2018 shortest x one hour up to  a few days mostly day time  Anterior L of sternum not exertional or pleuritic?  relieved by flatus?  For cardiac CT 10/29/18    No obvious day to day or daytime variability or assoc excess/ purulent sputum or mucus plugs or hemoptysis or   chest tightness, subjective wheeze or overt sinus or hb symptoms.   Sleeping as above  without nocturnal  or early am exacerbation  of respiratory  c/o's or need for noct saba. Also denies any obvious fluctuation of symptoms with weather or environmental changes or other aggravating or alleviating factors except as outlined above   No unusual exposure hx or h/o childhood pna/ asthma or knowledge of premature birth.  Current Allergies, Complete Past Medical History, Past Surgical History, Family History, and Social History were reviewed in [facility] record.  ROS  The following are not active complaints unless bolded Hoarseness, sore throat, dysphagia, dental problems, itching, sneezing,  nasal congestion or discharge of excess mucus or purulent secretions, ear ache,   fever, chills, sweats, unintended wt loss or wt gain, classically pleuritic or exertional cp,  orthopnea pnd or arm/hand swelling  or leg swelling, presyncope, palpitations, abdominal bloating , anorexia, nausea, vomiting, diarrhea  or change in bowel habits or change in bladder habits, change in stools or change in urine, dysuria, hematuria,  rash, arthralgias, visual complaints, headache, numbness, weakness or ataxia or problems with walking or coordination,  change in mood or  memory.        Current Meds  Medication Sig  . acetaminophen (TYLENOL) 500 MG tablet Take 1,000 mg by mouth every 6 (six) hours as needed for mild pain or moderate pain.  . Cholecalciferol (VITAMIN D) 2000 units CAPS Take 2,000 Units by mouth 3 (three) times a week.   . Multiple Vitamin (MULTIVITAMIN WITH MINERALS) TABS tablet Take 1 tablet by mouth daily. Centrum Silver for Men  . nitroGLYCERIN (NITROSTAT) 0.4 MG SL tablet Place 1 tablet (0.4 mg total) under the tongue every 5 (five) minutes as needed for chest pain.  Marland Kitchen ondansetron (ZOFRAN ODT) 4 MG disintegrating tablet Take 1 tablet (4 mg total) by mouth every 8 (eight) hours as needed for nausea.  . pantoprazole (PROTONIX) 40 MG tablet Take 40 mg daily by mouth.           Objective:   Physical Exam  Amb pleasant bm nad   09/27/2015       184 >  10/20/2016    189 > 10/20/2017   180 > 10/25/2018  187     08/28/15 177 lb 12.8 oz (80.65 kg)  08/17/15 178 lb (80.74 kg)  09/04/14 184 lb (83.462 kg)      Vital signs reviewed - Note on arrival 02 sats   97% on RA      HEENT: nl dentition, turbinates bilaterally, and oropharynx. Nl external ear canals without cough reflex   NECK :  without JVD/Nodes/TM/ nl carotid upstrokes  bilaterally   LUNGS: no acc muscle use,  Nl contour chest which is clear to A and P bilaterally without cough on insp or exp maneuvers   CV:  RRR  no s3 or murmur or increase in P2, and no edema   ABD:  soft and nontender with nl inspiratory excursion in the supine position. No bruits or organomegaly appreciated, bowel sounds nl  MS:  Nl gait/ ext warm without deformities, calf tenderness, cyanosis or clubbing No obvious joint restrictions   SKIN: warm and dry  without lesions    NEURO:  alert, approp, nl sensorium with  no motor or cerebellar deficits apparent.          I personally reviewed images and agree with radiology impression as follows:  CXR:   08/14/18  Negative two view chest x-ray    Assessment & Plan:

## 2018-10-25 NOTE — Assessment & Plan Note (Signed)
Classic subdiaphragmatic pain pattern suggests ibs:  Stereotypical, migratory with a very limited distribution of pain locations, daytime, not usually exacerbated by exercise  or coughing, worse in sitting position, frequently associated with generalized abd bloating, not as likely to be present supine due to the dome effect of the diaphragm which  is  canceled in that position. Frequently these patients have had multiple negative GI workups and CT scans.  Treatment consists of avoiding foods that cause gas (especially boiled eggs, mexcican food but especially  beans and undercooked vegetables like  spinach and some salads)  and citrucel 1 heaping tsp twice daily with a large glass of water.  Pain should improve w/in 2 weeks and if not then consider further GI work up - in meantime needs to complete the cards w/u as planned   I had an extended discussion with the patient/wife  reviewing all relevant studies completed to date and  lasting 15 to 20 minutes of a 25 minute visit    Each maintenance medication was reviewed in detail including most importantly the difference between maintenance and prns and under what circumstances the prns are to be triggered using an action plan format that is not reflected in the computer generated alphabetically organized AVS.     Please see AVS for specific instructions unique to this visit that I personally wrote and verbalized to the the pt in detail and then reviewed with pt  by my nurse highlighting any  changes in therapy recommended at today's visit to their plan of care.

## 2018-10-25 NOTE — Assessment & Plan Note (Addendum)
HRCT   09/27/15  1. No evidence of asbestos related pleural disease. 2. No evidence of interstitial lung disease. Specifically, no significant regions of subpleural reticulation, ground-glass attenuation, traction bronchiectasis or frank honeycombing. 3. Mild air trapping, suggestive of nonspecific mild small airway disease. 4. Solitary 4 mm right middle lobe pulmonary nodule with a benign Appearance. - PFT's 09/27/2015 wnl with FVC  4.33  97/96%  > 121 %  Corrected for alv vol  - PFTs  10/20/2017  wnl     FVC   3.87(85%)  With dlco 83/92c and corrects to 124% for alv vol - CTa 10/06/17 neg for asbestos changes   - PFT's  10/25/2018  FVC  3.97 (88 % ) ratio nl  p no % improvement from saba p nothing prior to study with DLCO  82 % corrects to 119  % for alv volume   - CT cardiac scheduled for  10/29/18   Discussed in detail all the  indications, usual  risks and alternatives  relative to the benefits with patient re routine screening CT's at this point > 25 years since last exposure to asbestos but since he's having a cardiac ct anyway I will place a reminder to review the lung windows on this study when results are available  F/u can be yearly here in this clinic  if he desires  - the alternative for the development of ILD assoc with asbestos is monitor sats with ex/ advised

## 2018-10-26 ENCOUNTER — Telehealth: Payer: Self-pay | Admitting: *Deleted

## 2018-10-26 DIAGNOSIS — Z79899 Other long term (current) drug therapy: Secondary | ICD-10-CM

## 2018-10-26 DIAGNOSIS — R079 Chest pain, unspecified: Secondary | ICD-10-CM

## 2018-10-26 NOTE — Telephone Encounter (Signed)
-----   Message from Imogene Burn, PA-C sent at 10/26/2018  7:50 AM EST ----- Kidney function up a little. Is he staying hydrated? Not on any meds to cause this.   his coronary CT scheduled for Friday. Ask him to increase his water intake and repeat bmet on thurs. Please call me thurs with results

## 2018-10-28 ENCOUNTER — Other Ambulatory Visit (HOSPITAL_COMMUNITY)
Admission: RE | Admit: 2018-10-28 | Discharge: 2018-10-28 | Disposition: A | Discharge status: Home / Self Care | Payer: Medicare HMO | Source: Ambulatory Visit | Attending: Physician Assistant | Admitting: Physician Assistant

## 2018-10-28 DIAGNOSIS — Z79899 Other long term (current) drug therapy: Secondary | ICD-10-CM | POA: Diagnosis not present

## 2018-10-28 LAB — BASIC METABOLIC PANEL
ANION GAP: 5 (ref 5–15)
BUN: 14 mg/dL (ref 8–23)
CALCIUM: 9.1 mg/dL (ref 8.9–10.3)
CO2: 27 mmol/L (ref 22–32)
Chloride: 105 mmol/L (ref 98–111)
Creatinine, Ser: 1.34 mg/dL — ABNORMAL HIGH (ref 0.61–1.24)
GFR, EST NON AFRICAN AMERICAN: 53 mL/min — AB (ref >60)
GLUCOSE: 100 mg/dL — AB (ref 70–99)
Potassium: 3.9 mmol/L (ref 3.5–5.1)
SODIUM: 137 mmol/L (ref 135–145)

## 2018-10-29 ENCOUNTER — Ambulatory Visit (HOSPITAL_COMMUNITY): Payer: Medicare HMO

## 2018-10-29 ENCOUNTER — Ambulatory Visit (HOSPITAL_COMMUNITY)
Admission: RE | Admit: 2018-10-29 | Discharge: 2018-10-29 | Disposition: A | Discharge status: Home / Self Care | Payer: Medicare HMO | Source: Ambulatory Visit | Attending: Physician Assistant | Admitting: Physician Assistant

## 2018-10-29 ENCOUNTER — Encounter: Payer: Medicare HMO | Admitting: *Deleted

## 2018-10-29 ENCOUNTER — Telehealth: Payer: Self-pay | Admitting: Physician Assistant

## 2018-10-29 DIAGNOSIS — Z86711 Personal history of pulmonary embolism: Secondary | ICD-10-CM | POA: Insufficient documentation

## 2018-10-29 DIAGNOSIS — Z8249 Family history of ischemic heart disease and other diseases of the circulatory system: Secondary | ICD-10-CM | POA: Diagnosis not present

## 2018-10-29 DIAGNOSIS — K219 Gastro-esophageal reflux disease without esophagitis: Secondary | ICD-10-CM | POA: Insufficient documentation

## 2018-10-29 DIAGNOSIS — R072 Precordial pain: Secondary | ICD-10-CM | POA: Insufficient documentation

## 2018-10-29 DIAGNOSIS — R911 Solitary pulmonary nodule: Secondary | ICD-10-CM | POA: Insufficient documentation

## 2018-10-29 DIAGNOSIS — Z006 Encounter for examination for normal comparison and control in clinical research program: Secondary | ICD-10-CM

## 2018-10-29 DIAGNOSIS — R0789 Other chest pain: Secondary | ICD-10-CM | POA: Diagnosis present

## 2018-10-29 DIAGNOSIS — Z8546 Personal history of malignant neoplasm of prostate: Secondary | ICD-10-CM | POA: Insufficient documentation

## 2018-10-29 DIAGNOSIS — R079 Chest pain, unspecified: Secondary | ICD-10-CM | POA: Diagnosis not present

## 2018-10-29 DIAGNOSIS — E785 Hyperlipidemia, unspecified: Secondary | ICD-10-CM | POA: Insufficient documentation

## 2018-10-29 MED ORDER — NITROGLYCERIN 0.4 MG SL SUBL
0.8000 mg | SUBLINGUAL_TABLET | SUBLINGUAL | Status: DC | PRN
  Filled 2018-10-29: qty 25

## 2018-10-29 MED ORDER — IOPAMIDOL (ISOVUE-370) INJECTION 76%
100.0000 mL | Freq: Once | INTRAVENOUS | Status: AC | PRN
  Administered 2018-10-29: 100 mL via INTRAVENOUS

## 2018-10-29 MED ORDER — NITROGLYCERIN 0.4 MG SL SUBL
SUBLINGUAL_TABLET | SUBLINGUAL | Status: AC
  Administered 2018-10-29: 0.8 mg
  Filled 2018-10-29: qty 2

## 2018-10-29 NOTE — Telephone Encounter (Signed)
Relayed to patient, Sharyn Lull lenze's message, will mail lab slip to PO address

## 2018-10-29 NOTE — Telephone Encounter (Signed)
Please call pt concerning his labs, he hasn't heard anything concerning his testing

## 2018-10-29 NOTE — Research (Signed)
Subject Name: Luis Stewart  Subject met inclusion and exclusion criteria.  The informed consent form, study requirements and expectations were reviewed with the subject and questions and concerns were addressed prior to the signing of the consent form.  The subject verbalized understanding of the trial requirements.  The subject agreed to participate in the CADFEM trial and signed the informed consent  on 10/29/2018.  The informed consent was obtained prior to performance of any protocol-specific procedures for the subject.  A copy of the signed informed consent was given to the subject and a copy was placed in the subject's medical record.   [name]

## 2018-11-03 ENCOUNTER — Telehealth: Payer: Self-pay | Admitting: *Deleted

## 2018-11-03 ENCOUNTER — Encounter: Payer: Self-pay | Admitting: *Deleted

## 2018-11-03 DIAGNOSIS — R079 Chest pain, unspecified: Secondary | ICD-10-CM

## 2018-11-03 NOTE — Telephone Encounter (Signed)
-----   Message from Imogene Burn, PA-C sent at 11/02/2018  1:52 PM EST ----- Insurance denied [facility]. Please order exercise myoview on patient for chest pain

## 2018-11-08 ENCOUNTER — Ambulatory Visit: Payer: Self-pay | Admitting: Cardiology

## 2018-11-10 ENCOUNTER — Encounter: Payer: Self-pay | Admitting: Family Medicine

## 2018-11-10 ENCOUNTER — Other Ambulatory Visit: Payer: Self-pay

## 2018-11-10 ENCOUNTER — Ambulatory Visit (INDEPENDENT_AMBULATORY_CARE_PROVIDER_SITE_OTHER): Payer: Medicare HMO | Admitting: Family Medicine

## 2018-11-10 VITALS — BP 138/86 | HR 56 | Temp 97.9°F | Resp 12 | Ht 74.0 in | Wt 187.0 lb

## 2018-11-10 DIAGNOSIS — Z23 Encounter for immunization: Secondary | ICD-10-CM

## 2018-11-10 DIAGNOSIS — E785 Hyperlipidemia, unspecified: Secondary | ICD-10-CM | POA: Diagnosis not present

## 2018-11-10 DIAGNOSIS — Z0000 Encounter for general adult medical examination without abnormal findings: Secondary | ICD-10-CM | POA: Diagnosis not present

## 2018-11-10 DIAGNOSIS — E559 Vitamin D deficiency, unspecified: Secondary | ICD-10-CM

## 2018-11-10 NOTE — Patient Instructions (Addendum)
Schedule with the Eye Doctor  Look at advanced directives  F/U 6 months

## 2018-11-10 NOTE — Progress Notes (Signed)
Subjective:   Patient presents for Medicare Annual/Subsequent preventive examination.    Pt here for CPE    Had cardiac work up which was negative for his recurrent chest pain, also had testing done as a part of a research study, he has NTG but has not used        Urology- elevated PSA, had biopsy in August, Prostate cancer in 10% of sample, still on surveillance every 6 months    GI- doing well on meds, has f/u 1 year        Review Past Medical/Family/Social: Per EMR    Risk Factors  Current exercise habits: walks Dietary issues discussed: none  Cardiac risk factors: Family history  Depression Screen  (Note: if answer to either of the following is "Yes", a more complete depression screening is indicated)  Over the past two weeks, have you felt down, depressed or hopeless? No Over the past two weeks, have you felt little interest or pleasure in doing things? No Have you lost interest or pleasure in daily life? No Do you often feel hopeless? No Do you cry easily over simple problems? No   Activities of Daily Living  In your present state of health, do you have any difficulty performing the following activities?:  Driving? No  Managing money? No  Feeding yourself? No  Getting from bed to chair? No  Climbing a flight of stairs? No  Preparing food and eating?: No  Bathing or showering? No  Getting dressed: No  Getting to the toilet? No  Using the toilet:No  Moving around from place to place: No  In the past year have you fallen or had a near fall?:No  Are you sexually active? Yes  Do you have more than one partner? No   Hearing Difficulties: No  Do you often ask people to speak up or repeat themselves? No  Do you experience ringing or noises in your ears? No Do you have difficulty understanding soft or whispered voices? No  Do you feel that you have a problem with memory? No Do you often misplace items? No  Do you feel safe at home? Yes  Cognitive Testing  Alert?  Yes Normal Appearance?Yes  Oriented to person? Yes Place? Yes  Time? Yes  Recall of three objects? Yes  Can perform simple calculations? Yes  Displays appropriate judgment?Yes  Can read the correct time from a watch face?Yes   List the Names of Other Physician/Practitioners you currently use:   Pulmonary- follows yearly due to [place] in past, PFT have been stable  Cardiology, GI, Urology, [facility]- Dr. Maretta Los  Ros: GEN- denies fatigue, fever, weight loss,weakness, recent illness HEENT- denies eye drainage, change in vision, nasal discharge, CVS- denies chest pain, palpitations RESP- denies SOB, cough, wheeze ABD- denies N/V, change in stools, abd pain GU- denies dysuria, hematuria, dribbling, incontinence MSK- denies joint pain, muscle aches, injury Neuro- denies headache, dizziness, syncope, seizure activity  Physical: Vitals reviewed  GEN- NAD, alert and oriented x3 HEENT- PERRL, EOMI, non injected sclera, pink conjunctiva, MMM, oropharynx clear Neck- Supple, no thryomegaly CVS- RRR, no murmur RESP-CTAB EXT- No edema Pulses- Radial, DP- 2+     Screening Tests / Date Colonoscopy UTD                     Zostavax UTD PNA- UTD Influenza Vaccine UTD Tetanus/tdap UTD  ROS: GEN- denies fatigue, fever, weight loss,weakness, recent illness HEENT- denies eye drainage, change in vision, nasal discharge, CVS-  denies chest pain, palpitations RESP- denies SOB, cough, wheeze ABD- denies N/V, change in stools, abd pain GU- denies dysuria, hematuria, dribbling, incontinence MSK- denies joint pain, muscle aches, injury Neuro- denies headache, dizziness, syncope, seizure activity  Physical: vitals reviewed GEN- NAD, alert and oriented x3 HEENT- PERRL, EOMI, non injected sclera, pink conjunctiva, MMM, oropharynx clear, TM clear bilat no effusion Neck- Supple, no thryomegaly, no bruit  CVS- RRR, no murmur RESP-CTAB ABD-NABS,soft,NT,ND EXT- No edema Pulses-  Radial, DP- 2+     Assessment:    Annual wellness medicare exam   Plan:    During the course of the visit the patient was educated and counseled about appropriate screening and preventive services including:    Preventative medicine and immunizations UTD - flu shot given today     FALL/Functional screen/Depression/Audit C- NEGATIVE    Discussed living will/advanced directives, handout given     Pt to schedule with eye doctor for routine care    No changes to medications    Check fasting labs     Continue f/u with his specialist   Diet review for nutrition referral? Yes ____ Not Indicated __x__  Patient Instructions (the written plan) was given to the patient.  Medicare Attestation  I have personally reviewed:  The patient's medical and social history  Their use of alcohol, tobacco or illicit drugs  Their current medications and supplements  The patient's functional ability including ADLs,fall risks, home safety risks, cognitive, and hearing and visual impairment  Diet and physical activities  Evidence for depression or mood disorders  The patient's weight, height, BMI, and visual acuity have been recorded in the chart. I have made referrals, counseling, and provided education to the patient based on review of the above and I have provided the patient with a written personalized care plan for preventive services.

## 2018-11-11 ENCOUNTER — Encounter: Payer: Self-pay | Admitting: Family Medicine

## 2018-11-11 LAB — COMPREHENSIVE METABOLIC PANEL
AG RATIO: 1.2 (calc) (ref 1.0–2.5)
ALT: 12 U/L (ref 9–46)
AST: 20 U/L (ref 10–35)
Albumin: 3.9 g/dL (ref 3.6–5.1)
Alkaline phosphatase (APISO): 134 U/L — ABNORMAL HIGH (ref 40–115)
BILIRUBIN TOTAL: 0.4 mg/dL (ref 0.2–1.2)
BUN: 15 mg/dL (ref 7–25)
CALCIUM: 9.6 mg/dL (ref 8.6–10.3)
CHLORIDE: 105 mmol/L (ref 98–110)
CO2: 30 mmol/L (ref 20–32)
Creat: 1.25 mg/dL (ref 0.70–1.25)
Globulin: 3.3 g/dL (calc) (ref 1.9–3.7)
Glucose, Bld: 99 mg/dL (ref 65–99)
Potassium: 4.3 mmol/L (ref 3.5–5.3)
Sodium: 140 mmol/L (ref 135–146)
Total Protein: 7.2 g/dL (ref 6.1–8.1)

## 2018-11-11 LAB — CBC WITH DIFFERENTIAL/PLATELET
Basophils Absolute: 29 cells/uL (ref 0–200)
Basophils Relative: 0.7 %
EOS PCT: 3.6 %
Eosinophils Absolute: 151 cells/uL (ref 15–500)
HCT: 41.5 % (ref 38.5–50.0)
HEMOGLOBIN: 13.8 g/dL (ref 13.2–17.1)
Lymphs Abs: 1390 cells/uL (ref 850–3900)
MCH: 28.6 pg (ref 27.0–33.0)
MCHC: 33.3 g/dL (ref 32.0–36.0)
MCV: 85.9 fL (ref 80.0–100.0)
MONOS PCT: 8.3 %
MPV: 11.9 fL (ref 7.5–12.5)
NEUTROS ABS: 2281 {cells}/uL (ref 1500–7800)
Neutrophils Relative %: 54.3 %
PLATELETS: 196 10*3/uL (ref 140–400)
RBC: 4.83 10*6/uL (ref 4.20–5.80)
RDW: 16 % — AB (ref 11.0–15.0)
TOTAL LYMPHOCYTE: 33.1 %
WBC mixed population: 349 cells/uL (ref 200–950)
WBC: 4.2 10*3/uL (ref 3.8–10.8)

## 2018-11-11 LAB — LIPID PANEL
CHOLESTEROL: 183 mg/dL (ref <200)
HDL: 52 mg/dL (ref >40)
LDL CHOLESTEROL (CALC): 110 mg/dL — AB
Non-HDL Cholesterol (Calc): 131 mg/dL (calc) — ABNORMAL HIGH (ref <130)
TRIGLYCERIDES: 105 mg/dL (ref <150)
Total CHOL/HDL Ratio: 3.5 (calc) (ref <5.0)

## 2018-11-11 LAB — VITAMIN D 25 HYDROXY (VIT D DEFICIENCY, FRACTURES): Vit D, 25-Hydroxy: 35 ng/mL (ref 30–100)

## 2018-12-01 DIAGNOSIS — R69 Illness, unspecified: Secondary | ICD-10-CM | POA: Diagnosis not present

## 2018-12-20 ENCOUNTER — Other Ambulatory Visit: Payer: Self-pay | Admitting: Nurse Practitioner

## 2019-02-15 DIAGNOSIS — C61 Malignant neoplasm of prostate: Secondary | ICD-10-CM | POA: Diagnosis not present

## 2019-02-22 ENCOUNTER — Ambulatory Visit: Payer: Medicare HMO | Admitting: Urology

## 2019-02-22 DIAGNOSIS — C61 Malignant neoplasm of prostate: Secondary | ICD-10-CM

## 2019-02-22 DIAGNOSIS — N5201 Erectile dysfunction due to arterial insufficiency: Secondary | ICD-10-CM | POA: Diagnosis not present

## 2019-02-22 DIAGNOSIS — N401 Enlarged prostate with lower urinary tract symptoms: Secondary | ICD-10-CM | POA: Diagnosis not present

## 2019-03-29 ENCOUNTER — Other Ambulatory Visit: Payer: Self-pay

## 2019-03-29 ENCOUNTER — Encounter: Payer: Self-pay | Admitting: Nurse Practitioner

## 2019-03-29 ENCOUNTER — Ambulatory Visit (INDEPENDENT_AMBULATORY_CARE_PROVIDER_SITE_OTHER): Payer: Medicare HMO | Admitting: Nurse Practitioner

## 2019-03-29 DIAGNOSIS — K219 Gastro-esophageal reflux disease without esophagitis: Secondary | ICD-10-CM | POA: Diagnosis not present

## 2019-03-29 DIAGNOSIS — K922 Gastrointestinal hemorrhage, unspecified: Secondary | ICD-10-CM | POA: Insufficient documentation

## 2019-03-29 NOTE — Assessment & Plan Note (Signed)
No further bleeding in the past year since he has been off his anticoagulation.  Continue to monitor, follow-up as needed.

## 2019-03-29 NOTE — Patient Instructions (Signed)
Your health issues we discussed today were:   GERD (heartburn): 1. I am glad your heartburn symptoms are doing well 2. Continue taking Protonix once a day 3. Notify us if you have any worsening or severe symptoms  Previous GI bleed: 1. I am glad you have not seen any further bleeding 2. Notify us if you see any red blood in your stools or pitch black/tarry stools  Overall I recommend:  1. Return for follow-up as needed 2. Call us if you have any questions or concerns.    Because of recent events of COVID-19 ("Coronavirus"), follow CDC recommendations:  1. Wash your hand frequently 2. Avoid touching your face 3. Stay away from people who are sick 4. If you have symptoms such as fever, cough, shortness of breath then call your healthcare provider for further guidance 5. If you are sick, STAY AT HOME unless otherwise directed by your healthcare provider. 6. Follow directions from state and national officials regarding staying safe    At [facility] we value your feedback. You may receive a survey about your visit today. Please share your experience as we strive to create trusting relationships with our patients to provide genuine, compassionate, quality care.  We appreciate your understanding and patience as we review any laboratory studies, imaging, and other diagnostic tests that are ordered as we care for you. Our office policy is 5 business days for review of these results, and any emergent or urgent results are addressed in a timely manner for your best interest. If you do not hear from our office in 1 week, please contact us.   We also encourage the use of MyChart, which contains your medical information for your review as well. If you are not enrolled in this feature, an access code is on this after visit summary for your convenience. Thank you for allowing [place] to be involved in your care.  It was great to see you today!  I hope you have a great day!!

## 2019-03-29 NOTE — Progress Notes (Signed)
Referring Provider: Alycia Rossetti, MD Primary Care Physician:  Alycia Rossetti, MD Primary GI:  Dr. Gala Romney  NOTE: Service was provided via telemedicine and was requested by the patient due to COVID-19 pandemic.  Method of visit: Zoom  Patient Location: home  Provider Location: Office  Reason for Phone Visit: Follow-up  The patient was consented to phone follow-up via telephone encounter including billing of the encounter (yes/no): yes  Persons present on the phone encounter, with roles: Wife  Total time (minutes) spent on medical discussion: 22 minutes  Chief Complaint  Patient presents with  . Follow-up    GERD, rectal bleeding    HPI:   Luis Stewart is a 68 y.o. male who presents for virtual visit regarding: Follow-up on GERD and heme positive stool.  Colonoscopy up-to-date and will be due in 2020, although elective procedures are currently being postponed given the COVID-19 pandemic.  Previous acute blood loss anemia with a hemoglobin and 9.3 status post anticoagulation for pulmonary embolism in 2018 after knee replacement.  EGD, colonoscopy, capsule endoscopy at that time essentially normal with no source of active bleeding.  Recommended to avoid Xarelto and changed to Eliquis.  CT confirmed no further pulmonary embolism in 2018.  At his last visit he was doing well, GERD well managed on PPI and was on once a day dosing at that time.  No other GI symptoms.  Recommended continue Protonix daily, continue other medications, follow-up with other providers as recommended, follow-up here in 1 year, trigger avoidance.  Today he states he's doing well overall. Still on Protonix daily. GERD doing well overall. Rare breakthrough, doesn't need anything when that occurs. Denies hematochezia, melena. Denies abdominal pain, N/V, fever, chills, unintentional weight loss. Has had some recent allergy symptoms intermittently. Denies chest pain, dyspnea, dizziness, lightheadedness,  syncope, near syncope. Denies any other upper or lower GI symptoms.  Past Medical History:  Diagnosis Date  . Arthritis   . Asbestosis ([place])   . Cyst of kidney, acquired   . Headache   . History of fracture of leg    Right, childhood  . Hx of migraine headaches   . Hyperlipidemia   . Neuropathy   . PE (pulmonary thromboembolism) ([place])    After knee surgery  . Prostate cancer ([place]) 2015   Adenocarcinoma by biopsy    Past Surgical History:  Procedure Laterality Date  . BIOPSY PROSTATE  2003 and 2005  . COLONOSCOPY  11/28/2008   Dr. Rourk:internal hemorrhoids/tortus colon/ascending colon polyps, tubular adenomas  . COLONOSCOPY N/A 09/04/2014   Procedure: COLONOSCOPY;  Surgeon: Daneil Dolin, MD;  [facility];  Service: Endoscopy;  Laterality: N/A;  9:30  . COLONOSCOPY N/A 07/17/2017   Procedure: COLONOSCOPY;  Surgeon: Danie Binder, MD;  [facility];  Service: Endoscopy;  Laterality: N/A;  . ESOPHAGOGASTRODUODENOSCOPY N/A 07/16/2017   Procedure: ESOPHAGOGASTRODUODENOSCOPY (EGD);  Surgeon: Danie Binder, MD;  [facility];  Service: Endoscopy;  Laterality: N/A;  . GIVENS CAPSULE STUDY  07/16/2017   Procedure: GIVENS CAPSULE STUDY;  Surgeon: Danie Binder, MD;  [facility];  Service: Endoscopy;;  . POLYPECTOMY  07/17/2017   Procedure: POLYPECTOMY;  Surgeon: Danie Binder, MD;  [facility];  Service: Endoscopy;;  cecal  . TOTAL KNEE ARTHROPLASTY Right 06/10/2017  . TOTAL KNEE ARTHROPLASTY Right 06/10/2017   Procedure: TOTAL KNEE ARTHROPLASTY;  Surgeon: [name], MD;  Location: [facility];  Service:  Orthopedics;  Laterality: Right;    Current Outpatient Medications  Medication Sig Dispense Refill  . acetaminophen (TYLENOL) 500 MG tablet Take 1,000 mg by mouth every 6 (six) hours as needed for mild pain or moderate pain.    . Cholecalciferol (VITAMIN D) 2000 units CAPS Take 2,000 Units by mouth 3 (three) times a week.      . Multiple Vitamin (MULTIVITAMIN WITH MINERALS) TABS tablet Take 1 tablet by mouth daily. Centrum Silver for Men    . nitroGLYCERIN (NITROSTAT) 0.4 MG SL tablet Place 1 tablet (0.4 mg total) under the tongue every 5 (five) minutes as needed for chest pain. 30 tablet 0  . pantoprazole (PROTONIX) 40 MG tablet Take 1 tablet (40 mg total) by mouth daily before breakfast. 90 tablet 3   No current facility-administered medications for this visit.     Allergies as of 03/29/2019 - Review Complete 03/29/2019  Allergen Reaction Noted  . Sulfa antibiotics Other (See Comments) 05/26/2017    Family History  Problem Relation Age of Onset  . Diabetes Mother   . Hypertension Mother   . Obesity Mother   . Heart disease Mother   . Mental illness Mother   . Hypertension Sister   . Obesity Sister   . Arthritis Sister   . Deep vein thrombosis Father   . Dementia Father   . Heart disease Father        CABG  . Colon cancer Neg Hx     Social History   Socioeconomic History  . Marital status: Married    Spouse name: Not on file  . Number of children: Not on file  . Years of education: Not on file  . Highest education level: Not on file  Occupational History  . Occupation: Retired  Scientific laboratory technician  . Financial resource strain: Not on file  . Food insecurity:    Worry: Not on file    Inability: Not on file  . Transportation needs:    Medical: Not on file    Non-medical: Not on file  Tobacco Use  . Smoking status: Never Smoker  . Smokeless tobacco: Never Used  Substance and Sexual Activity  . Alcohol use: No  . Drug use: No  . Sexual activity: Yes  Lifestyle  . Physical activity:    Days per week: Not on file    Minutes per session: Not on file  . Stress: Not on file  Relationships  . Social connections:    Talks on phone: Not on file    Gets together: Not on file    Attends religious service: Not on file    Active member of club or organization: Not on file    Attends meetings  of clubs or organizations: Not on file    Relationship status: Not on file  Other Topics Concern  . Not on file  Social History Narrative  . Not on file    Review of Systems: General: Negative for anorexia, weight loss, fever, chills, fatigue, weakness. ENT: Negative for hoarseness, difficulty swallowing. CV: Negative for chest pain, angina, palpitations, peripheral edema.  Respiratory: Negative for dyspnea at rest, cough, sputum, wheezing.  GI: See history of present illness. Endo: Negative for unusual weight change.  Heme: Negative for bruising or bleeding. Allergy: Negative for rash or hives.  Physical Exam: Note: limited exam due to virtual visit General:   Alert and oriented. Pleasant and cooperative. Well-nourished and well-developed.  Head:  Normocephalic and atraumatic. Eyes:  Without icterus, sclera  clear and conjunctiva pink.  Ears:  Normal auditory acuity. Skin:  Intact without facial significant lesions or rashes. Neurologic:  Alert and oriented x4;  grossly normal neurologically. Psych:  Alert and cooperative. Normal mood and affect. Heme/Lymph/Immune: No excessive bruising noted.

## 2019-03-29 NOTE — Progress Notes (Signed)
CC'ED TO PCP 

## 2019-03-29 NOTE — Assessment & Plan Note (Signed)
GERD symptoms currently doing well on once daily PPI.  No need for refills at this time.  Continue current medications and follow-up as needed.

## 2019-05-11 ENCOUNTER — Ambulatory Visit: Payer: Medicare HMO | Admitting: Family Medicine

## 2019-05-11 ENCOUNTER — Encounter: Payer: Self-pay | Admitting: Family Medicine

## 2019-05-11 ENCOUNTER — Ambulatory Visit (INDEPENDENT_AMBULATORY_CARE_PROVIDER_SITE_OTHER): Payer: Medicare HMO | Admitting: Family Medicine

## 2019-05-11 ENCOUNTER — Other Ambulatory Visit: Payer: Self-pay

## 2019-05-11 DIAGNOSIS — C61 Malignant neoplasm of prostate: Secondary | ICD-10-CM | POA: Diagnosis not present

## 2019-05-11 DIAGNOSIS — N529 Male erectile dysfunction, unspecified: Secondary | ICD-10-CM

## 2019-05-11 DIAGNOSIS — K219 Gastro-esophageal reflux disease without esophagitis: Secondary | ICD-10-CM | POA: Diagnosis not present

## 2019-05-11 NOTE — Progress Notes (Signed)
Virtual Visit via Telephone Note  I connected with Luis Stewart on 05/11/19 at 9:07am  by telephone and verified that I am speaking with the correct person using two identifiers.   Pt location: at home   Physician location:  In office, [facility], Vic Blackbird MD     On call: patient and physician     I discussed the limitations, risks, security and privacy concerns of performing an evaluation and management service by telephone and the availability of in person appointments. I also discussed with the patient that there may be a patient responsible charge related to this service. The patient expressed understanding and agreed to proceed.   History of Present Illness:   Telephone visit to f/u chronic medical problems No specific concerns  reviewed medications Asked if he should be tested due to age. 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 No current URI/VIRAL symptoms  GERD- taking protonix / reviewed recent GI note from April  On the phone with him   Seen by urology , has not tried viagra but asked about the dosing, prescribed 100mg  tablet , PSA down to 15 previous 18 , no urinary symptoms reviewed note with him on the phone  Still gets intermittant chest pain- has had extensive work up by cardiology ,pulmonary nothing found, no use of NTG     Observations/Objective: NAD   Assessment and Plan: No current symptoms, advised testing not needed, continue to adhere to COVID-19 precautions  Prostate cancer with Elevated PSA with ED- advised to try 50mg  of the viagra first   GERD- continue protonix, history of GI bleed Plan F/U NOv for CPE   Follow Up Instructions:    I discussed the assessment and treatment plan with the patient. The patient was provided an opportunity to ask questions and all were answered. The patient agreed with the plan and demonstrated an understanding of the instructions.   The patient was advised to call back or  seek an in-person evaluation if the symptoms worsen or if the condition fails to improve as anticipated.  I provided 13 minutes of non-face-to-face time during this encounter. END TIME: 9:20   Vic Blackbird, MD

## 2019-05-17 DIAGNOSIS — K219 Gastro-esophageal reflux disease without esophagitis: Secondary | ICD-10-CM | POA: Diagnosis not present

## 2019-05-17 DIAGNOSIS — Z882 Allergy status to sulfonamides status: Secondary | ICD-10-CM | POA: Diagnosis not present

## 2019-05-17 DIAGNOSIS — Z833 Family history of diabetes mellitus: Secondary | ICD-10-CM | POA: Diagnosis not present

## 2019-05-17 DIAGNOSIS — Z7722 Contact with and (suspected) exposure to environmental tobacco smoke (acute) (chronic): Secondary | ICD-10-CM | POA: Diagnosis not present

## 2019-05-17 DIAGNOSIS — Z8249 Family history of ischemic heart disease and other diseases of the circulatory system: Secondary | ICD-10-CM | POA: Diagnosis not present

## 2019-05-17 DIAGNOSIS — N529 Male erectile dysfunction, unspecified: Secondary | ICD-10-CM | POA: Diagnosis not present

## 2019-05-17 DIAGNOSIS — C61 Malignant neoplasm of prostate: Secondary | ICD-10-CM | POA: Diagnosis not present

## 2019-06-03 DIAGNOSIS — M1711 Unilateral primary osteoarthritis, right knee: Secondary | ICD-10-CM | POA: Diagnosis not present

## 2019-06-21 ENCOUNTER — Other Ambulatory Visit: Payer: Self-pay

## 2019-06-21 DIAGNOSIS — Z20822 Contact with and (suspected) exposure to covid-19: Secondary | ICD-10-CM

## 2019-06-27 LAB — NOVEL CORONAVIRUS, NAA: SARS-CoV-2, NAA: NOT DETECTED

## 2019-07-14 DIAGNOSIS — Z0100 Encounter for examination of eyes and vision without abnormal findings: Secondary | ICD-10-CM | POA: Diagnosis not present

## 2019-07-14 DIAGNOSIS — H5200 Hypermetropia, unspecified eye: Secondary | ICD-10-CM | POA: Diagnosis not present

## 2019-08-17 ENCOUNTER — Other Ambulatory Visit: Payer: Self-pay

## 2019-08-18 ENCOUNTER — Ambulatory Visit (INDEPENDENT_AMBULATORY_CARE_PROVIDER_SITE_OTHER): Payer: Medicare HMO | Admitting: *Deleted

## 2019-08-18 DIAGNOSIS — Z23 Encounter for immunization: Secondary | ICD-10-CM

## 2019-08-18 NOTE — Progress Notes (Signed)
Patient seen in office for Influenza Vaccination.   Tolerated IM administration well.   Immunization history updated.  

## 2019-08-23 ENCOUNTER — Other Ambulatory Visit: Payer: Self-pay

## 2019-08-23 DIAGNOSIS — Z20822 Contact with and (suspected) exposure to covid-19: Secondary | ICD-10-CM

## 2019-08-23 DIAGNOSIS — R6889 Other general symptoms and signs: Secondary | ICD-10-CM | POA: Diagnosis not present

## 2019-08-25 LAB — NOVEL CORONAVIRUS, NAA: SARS-CoV-2, NAA: NOT DETECTED

## 2019-09-05 DIAGNOSIS — C61 Malignant neoplasm of prostate: Secondary | ICD-10-CM | POA: Diagnosis not present

## 2019-09-09 ENCOUNTER — Other Ambulatory Visit: Payer: Self-pay | Admitting: *Deleted

## 2019-09-09 DIAGNOSIS — R6889 Other general symptoms and signs: Secondary | ICD-10-CM | POA: Diagnosis not present

## 2019-09-09 DIAGNOSIS — Z20822 Contact with and (suspected) exposure to covid-19: Secondary | ICD-10-CM

## 2019-09-10 LAB — NOVEL CORONAVIRUS, NAA: SARS-CoV-2, NAA: NOT DETECTED

## 2019-10-24 ENCOUNTER — Other Ambulatory Visit: Payer: Self-pay

## 2019-10-24 ENCOUNTER — Other Ambulatory Visit (HOSPITAL_COMMUNITY)
Admission: RE | Admit: 2019-10-24 | Discharge: 2019-10-24 | Disposition: A | Discharge status: Home / Self Care | Payer: Medicare HMO | Source: Ambulatory Visit | Attending: Internal Medicine | Admitting: Internal Medicine

## 2019-10-24 DIAGNOSIS — Z01812 Encounter for preprocedural laboratory examination: Secondary | ICD-10-CM | POA: Insufficient documentation

## 2019-10-24 DIAGNOSIS — Z20828 Contact with and (suspected) exposure to other viral communicable diseases: Secondary | ICD-10-CM | POA: Insufficient documentation

## 2019-10-24 LAB — SARS CORONAVIRUS 2 (TAT 6-24 HRS): SARS Coronavirus 2: NEGATIVE

## 2019-10-25 NOTE — Progress Notes (Signed)
mychart note sent

## 2019-10-27 ENCOUNTER — Other Ambulatory Visit: Payer: Self-pay | Admitting: Internal Medicine

## 2019-10-27 DIAGNOSIS — R058 Other specified cough: Secondary | ICD-10-CM

## 2019-10-27 DIAGNOSIS — R05 Cough: Secondary | ICD-10-CM

## 2019-10-27 DIAGNOSIS — R0600 Dyspnea, unspecified: Secondary | ICD-10-CM

## 2019-10-28 ENCOUNTER — Other Ambulatory Visit: Payer: Self-pay

## 2019-10-28 ENCOUNTER — Encounter: Payer: Self-pay | Admitting: Internal Medicine

## 2019-10-28 ENCOUNTER — Ambulatory Visit (INDEPENDENT_AMBULATORY_CARE_PROVIDER_SITE_OTHER): Payer: Medicare HMO | Admitting: Internal Medicine

## 2019-10-28 ENCOUNTER — Ambulatory Visit (INDEPENDENT_AMBULATORY_CARE_PROVIDER_SITE_OTHER): Payer: Medicare HMO

## 2019-10-28 DIAGNOSIS — R0600 Dyspnea, unspecified: Secondary | ICD-10-CM

## 2019-10-28 DIAGNOSIS — R058 Other specified cough: Secondary | ICD-10-CM

## 2019-10-28 DIAGNOSIS — R05 Cough: Secondary | ICD-10-CM

## 2019-10-28 DIAGNOSIS — Z77090 Contact with and (suspected) exposure to asbestos: Secondary | ICD-10-CM | POA: Diagnosis not present

## 2019-10-28 LAB — PULMONARY FUNCTION TEST
DL/VA % pred: 130 %
DL/VA: 5.23 ml/min/mmHg/L
DLCO unc % pred: 104 %
DLCO unc: 30.66 ml/min/mmHg
FEF 25-75 Post: 3.53 L/sec
FEF 25-75 Pre: 3.32 L/sec
FEF2575-%Change-Post: 6 %
FEF2575-%Pred-Post: 120 %
FEF2575-%Pred-Pre: 113 %
FEV1-%Change-Post: 0 %
FEV1-%Pred-Post: 96 %
FEV1-%Pred-Pre: 95 %
FEV1-Post: 3.27 L
FEV1-Pre: 3.27 L
FEV1FVC-%Change-Post: 2 %
FEV1FVC-%Pred-Pre: 105 %
FEV6-%Change-Post: -1 %
FEV6-%Pred-Post: 91 %
FEV6-%Pred-Pre: 93 %
FEV6-Post: 3.94 L
FEV6-Pre: 4.01 L
FEV6FVC-%Change-Post: 0 %
FEV6FVC-%Pred-Post: 103 %
FEV6FVC-%Pred-Pre: 103 %
FVC-%Change-Post: -2 %
FVC-%Pred-Post: 88 %
FVC-%Pred-Pre: 90 %
FVC-Post: 3.97 L
FVC-Pre: 4.06 L
Post FEV1/FVC ratio: 83 %
Post FEV6/FVC ratio: 99 %
Pre FEV1/FVC ratio: 81 %
Pre FEV6/FVC Ratio: 99 %
RV % pred: 73 %
RV: 1.95 L
TLC % pred: 75 %
TLC: 5.95 L

## 2019-10-28 NOTE — Progress Notes (Signed)
PFT done today. 

## 2019-10-28 NOTE — Progress Notes (Signed)
Subjective:   Patient ID: Luis Stewart, male    DOB: 26-Feb-1951    MRN: [id number]    Brief patient profile:  45 yobm from [place]  never smoker worked for [employer] until 2003 last exposed abestos around 1980 with "abn" cxr 2014 referred by Luis Stewart to pulmonary clinic 08/28/2015 with rec from [facility] to get pfts yearly but no evidence of any asbestosis related dz on CT as recently as  10/25/2018    History of Present Illness  08/28/2015 1st [facility] office visit/ Luis Stewart   Chief Complaint  Patient presents with  . Pulmonary Consult    Referred by Luis Stewart. Pt has hx of asbestos exposure. He states that he is overall doing well. He does have some prod cough with clear to yellow sputum.    eval by Luis Stewart with ? increased int markings but no f/u ct to date  Am cough x decades/ sensation of pnds / has not tried otc's rec Please see patient coordinator before you leave today  to schedule HRCT chest and return here same day with pfts on return  Try zyrtec 10 mg at bedtime to see if your daily am cough is better if now I recommend For drainage take chlortrimeton (chlorpheniramine) 4 mg every 4 hours available over the counter (may cause drowsiness)    09/27/2015  f/u ov/Luis Stewart re: asbestos f/u  Chief Complaint  Patient presents with  . Follow-up    Hx of asbestos exposure. Pt states that hsi breathing is doing well. Pt c/o of occasional wet cough, yellow to clear mucus. Pt denies wheeze/CP/SOB.    Not limited by breathing from desired activities  rec Try zyrtec 10 mg at bedtime to see if your daily am cough is better if not I recommend For drainage take chlortrimeton (chlorpheniramine) 4 mg every 4 hours available over the counter (may cause drowsiness)    10/20/2016  f/u ov/Luis Stewart re: asbestos f/u  Chief Complaint  Patient presents with  . Follow-up    PFT's and cxr done today. He c/o soreness in his chest for the past year. He has had some PND  and sore throat for the past day.   not using otc's as rec "too sleepy" but actually did work  rec Return in one year for pfts and cxr  - call for earlier follow up for cough or breathing issues  Try clariton or allergra as needed for drainage but try not to clear throat  GERD diet    10/20/2017  f/u ov/Luis Stewart re: asbestos f/u  Chief Complaint  Patient presents with  . Follow-up    PFT today prior to visit. Pt has productive cough-clear. Pt has SOB, wheezing with cough. Pt had knee replacement in June 2018 had PE afterwards.  really not limited by breathing and R knee doing well.  No noct cough/ wheeze/ only sob if overdoes it Off anticoagulants since Sep 21 2017 Rec Protonix 40 mg be sure to Take 30-60 min before first meal of the day  GERD  Diet    10/25/2018  f/u ov/Luis Stewart re: asbestos exp last around 1980  Chief Complaint  Patient presents with  . Follow-up    PFT's done today. Breathing is overall doing well. He is coughing less.   Dyspnea:  Not limited by breathing from desired activities  But no aerobics Cough: none Sleeping: on side/ bed flat SABA use: none 02: none  Recurrent cp 2015 neg cardiac eval  in past / started back up 2018 shortest x one hour up to a few days mostly day time  Anterior L of sternum not exertional or pleuritic?  relieved by flatus?  For cardiac CT 10/29/18 > neg for CAD/neg for asbestosis related lung/ pleural dz    10/28/2019  f/u ov/Luis Stewart re: yearly eval with pfts/ cxr s/p asbestos exp Chief Complaint  Patient presents with  . Follow-up    PFT's done. Breathing is doing well and no co's.   Dyspnea:  Not limited by breathing from desired activities   Cough: some mild pnds nothing purulent, no specific pattern s much cough better with zyrtec  Sleeping: flat fine  SABA use: none  02:  none   No  assoc excess/ purulent sputum or mucus plugs or hemoptysis or cp or chest tightness, subjective wheeze or overt sinus or hb symptoms.   Sleeping as  above without nocturnal  or early am exacerbation  of respiratory  c/o's or need for noct saba. Also denies any obvious fluctuation of symptoms with weather or environmental changes or other aggravating or alleviating factors except as outlined above   No unusual exposure hx or h/o childhood pna/ asthma or knowledge of premature birth.  Current Allergies, Complete Past Medical History, Past Surgical History, Family History, and Social History were reviewed in [employer] record.  ROS  The following are not active complaints unless bolded Hoarseness, sore throat, dysphagia, dental problems, itching, sneezing,  nasal congestion or discharge of excess mucus or purulent secretions, ear ache,   fever, chills, sweats, unintended wt loss or wt gain, classically pleuritic or exertional cp,  orthopnea pnd or arm/hand swelling  or leg swelling, presyncope, palpitations, abdominal pain, anorexia, nausea, vomiting, diarrhea  or change in bowel habits or change in bladder habits, change in stools or change in urine, dysuria, hematuria,  rash, arthralgias, visual complaints, headache, numbness, weakness or ataxia or problems with walking or coordination,  change in mood or  memory.        Current Meds  Medication Sig  . acetaminophen (TYLENOL) 500 MG tablet Take 1,000 mg by mouth every 6 (six) hours as needed for mild pain or moderate pain.  . Cholecalciferol (VITAMIN D) 2000 units CAPS Take 2,000 Units by mouth 3 (three) times a week.   . Multiple Vitamin (MULTIVITAMIN WITH MINERALS) TABS tablet Take 1 tablet by mouth daily. Centrum Silver for Men  . nitroGLYCERIN (NITROSTAT) 0.4 MG SL tablet Place 1 tablet (0.4 mg total) under the tongue every 5 (five) minutes as needed for chest pain.  . pantoprazole (PROTONIX) 40 MG tablet Take 1 tablet (40 mg total) by mouth daily before breakfast.                 Objective:   Physical Exam  Stoic amb bm nad   BP 126/74 (BP Location: Left  Arm, Cuff Size: Normal)   Pulse 68   Temp (!) 97.4 F (36.3 C) (Temporal)   Ht 6\' 2"  (1.88 m)   Wt 182 lb (82.6 kg)   SpO2 99% Comment: on RA  BMI 23.37 kg/m     Wt baseline  09/04/14 184 lb (83.462 kg)       HEENT : pt wearing mask not removed for exam due to covid -19 concerns.    NECK :  without JVD/Nodes/TM/ nl carotid upstrokes bilaterally   LUNGS: no acc muscle use,  Nl contour chest which is clear to A and P bilaterally  without cough on insp or exp maneuvers   CV:  RRR  no s3 or murmur or increase in P2, and no edema   ABD:  soft and nontender with nl inspiratory excursion in the supine position. No bruits or organomegaly appreciated, bowel sounds nl  MS:  Nl gait/ ext warm without deformities, calf tenderness, cyanosis or clubbing No obvious joint restrictions   SKIN: warm and dry without lesions    NEURO:  alert, approp, nl sensorium with  no motor or cerebellar deficits apparent.    CXR PA and Lateral:   10/28/2019 :    I personally reviewed images  radiology impression as follows:   No changes - no ild/ no plaques        Assessment & Plan:

## 2019-10-28 NOTE — Patient Instructions (Signed)
Please remember to go to the x-ray department   for your tests - we will call you with the results when they are available.     Please schedule a follow up visit in  12 months but call sooner if needed.    

## 2019-10-29 ENCOUNTER — Encounter: Payer: Self-pay | Admitting: Internal Medicine

## 2019-10-29 NOTE — Assessment & Plan Note (Signed)
Last significant exposure probably 1980/ never smoker HRCT   09/27/15  1. No evidence of asbestos related pleural disease. 2. No evidence of interstitial lung disease. Specifically, no significant regions of subpleural reticulation, ground-glass attenuation, traction bronchiectasis or frank honeycombing. 3. Mild air trapping, suggestive of nonspecific mild small airway disease. 4. Solitary 4 mm right middle lobe pulmonary nodule with a benign Appearance. - PFT's 09/27/2015 wnl with FVC  4.33  97/96%  > 121 %  Corrected for alv vol  - PFTs  10/20/2017  wnl     FVC   3.87(85%)  With dlco 83/92c and corrects to 124% for alv vol - CTa 10/06/17 neg for asbestos changes  - PFT's  10/25/2018  FVC  3.97 (88 % ) ratio nl  p no % improvement from saba p nothing prior to study with DLCO  82 % corrects to 119  % for alv volume   - CT chest 10/29/18 >>> No suspicious pulmonary nodule or mass within the visualized lung parenchyma. No visualized pleural effusion. No worrisome or suspicious osseous abnormality.  PFT's  10/28/2019  FVC 4.06 (90 % ) ratio 0.83  with DLCO  30.66 (104%) corrects to 5.23 (130%)    No evidence of ILD or plaques or lung ca.  Given that he's a never smoker his risk is low but not zero of ever having any of the above and reasonable to continue yearly checks which he says were rec by [employer].   In meantime advised to stay active and report any new unexpained cough or sob.  Discussed in detail all the  indications, usual  risks and alternatives  relative to the benefits with patient who agrees to proceed with conservative f/u as outlined    > 50% of this 25 min office spent in counseling.

## 2019-10-31 ENCOUNTER — Telehealth: Payer: Self-pay | Admitting: Internal Medicine

## 2019-10-31 DIAGNOSIS — Z20828 Contact with and (suspected) exposure to other viral communicable diseases: Secondary | ICD-10-CM

## 2019-10-31 DIAGNOSIS — Z20822 Contact with and (suspected) exposure to covid-19: Secondary | ICD-10-CM

## 2019-10-31 NOTE — Telephone Encounter (Signed)
Call returned to patient, confirmed DOB, made of MW recommendations. Inquired whether he can go to [facility] for his testing. I made him aware he can go the Short Stay Entrance at [facility]. Voiced understanding. Aware to quarantine until 11/16. Voiced understanding.   Nothing further needed at this time.

## 2019-10-31 NOTE — Telephone Encounter (Signed)
Yes, he's now a week out and good time to test' Should strictly quarantine until gets test back and after that strictly follow the guidelines going forward on masks, distancing and no exposure to positive cases x 2 weeks which for his fm would be 11/07/19

## 2019-10-31 NOTE — Progress Notes (Signed)
Spoke with pt and notified of results per Dr. Wert. Pt verbalized understanding and denied any questions. 

## 2019-10-31 NOTE — Telephone Encounter (Signed)
Spoke with pt  He states that he just learned yesterday that his daughter and grand daughter tested pos for covid  They were tested on Thurs 11/5 and he was last exposed to them on 10/24/19  He is not having any symptoms, but wants to know if he needs to go and get tested  Please advise, thanks

## 2019-11-01 ENCOUNTER — Other Ambulatory Visit: Payer: Self-pay

## 2019-11-01 DIAGNOSIS — Z20822 Contact with and (suspected) exposure to covid-19: Secondary | ICD-10-CM

## 2019-11-02 LAB — NOVEL CORONAVIRUS, NAA: SARS-CoV-2, NAA: NOT DETECTED

## 2019-11-03 NOTE — Progress Notes (Signed)
mychart note sent

## 2019-11-16 ENCOUNTER — Ambulatory Visit (INDEPENDENT_AMBULATORY_CARE_PROVIDER_SITE_OTHER): Payer: Medicare HMO | Admitting: Family Medicine

## 2019-11-16 ENCOUNTER — Other Ambulatory Visit: Payer: Self-pay

## 2019-11-16 ENCOUNTER — Encounter: Payer: Self-pay | Admitting: Family Medicine

## 2019-11-16 VITALS — BP 122/66 | HR 64 | Temp 98.7°F | Resp 14 | Ht 74.0 in | Wt 183.0 lb

## 2019-11-16 DIAGNOSIS — K219 Gastro-esophageal reflux disease without esophagitis: Secondary | ICD-10-CM

## 2019-11-16 DIAGNOSIS — Z0000 Encounter for general adult medical examination without abnormal findings: Secondary | ICD-10-CM

## 2019-11-16 DIAGNOSIS — C61 Malignant neoplasm of prostate: Secondary | ICD-10-CM

## 2019-11-16 DIAGNOSIS — E785 Hyperlipidemia, unspecified: Secondary | ICD-10-CM | POA: Diagnosis not present

## 2019-11-16 DIAGNOSIS — E559 Vitamin D deficiency, unspecified: Secondary | ICD-10-CM

## 2019-11-16 DIAGNOSIS — Z0001 Encounter for general adult medical examination with abnormal findings: Secondary | ICD-10-CM | POA: Diagnosis not present

## 2019-11-16 MED ORDER — PANTOPRAZOLE SODIUM 40 MG PO TBEC: 40.0000 mg | DELAYED_RELEASE_TABLET | Freq: Every day | ORAL | 3 refills | Status: DC

## 2019-11-16 NOTE — Patient Instructions (Signed)
F/U 1 year for physical We will call with lab results

## 2019-11-16 NOTE — Progress Notes (Signed)
Subjective:   Patient presents for Medicare Annual/Subsequent preventive examination.    Pt here for CPE     Urology- Prostate cancer in 10% of sample, still on surveillance every 6 months , he had recent PSA which was stable   He had yearly surveillance with pulmonary due to Asbestos exposure , CXR and PFT looked good in Early November   GI- doing well on meds on PPI with history of GI bleed , he has been using more fiber to help keep regular    Daughters had COVID-19, he was tested on 11/10 and tested negative     Review Past Medical/Family/Social: Per EMR    Risk Factors  Current exercise habits: walks Dietary issues discussed: none  Cardiac risk factors: Family history  Depression Screen  (Note: if answer to either of the following is "Yes", a more complete depression screening is indicated)  Over the past two weeks, have you felt down, depressed or hopeless? No Over the past two weeks, have you felt little interest or pleasure in doing things? No Have you lost interest or pleasure in daily life? No Do you often feel hopeless? No Do you cry easily over simple problems? No   Activities of Daily Living  In your present state of health, do you have any difficulty performing the following activities?:  Driving? No  Managing money? No  Feeding yourself? No  Getting from bed to chair? No  Climbing a flight of stairs? No  Preparing food and eating?: No  Bathing or showering? No  Getting dressed: No  Getting to the toilet? No  Using the toilet:No  Moving around from place to place: No  In the past year have you fallen or had a near fall?:No  Are you sexually active? Yes  Do you have more than one partner? No   Hearing Difficulties: No  Do you often ask people to speak up or repeat themselves? No  Do you experience ringing or noises in your ears? No Do you have difficulty understanding soft or whispered voices? No  Do you feel that you have a problem with  memory? No Do you often misplace items? No  Do you feel safe at home? Yes  Cognitive Testing  Alert? Yes Normal Appearance?Yes  Oriented to person? Yes Place? Yes  Time? Yes  Recall of three objects? Yes  Can perform simple calculations? Yes  Displays appropriate judgment?Yes  Can read the correct time from a watch face?Yes   List the Names of Other Physician/Practitioners you currently use:   Pulmonary- follows yearly due to [place]os in past, PFT have been stable  Cardiology, GI, Urology, Dentist Orthopedics- Dr. Maretta Los Eyes Exam- Dr. Jorja Loa- [facility]    Screening Tests / Date Colonoscopy UTD                     Zostavax UTD PNA- UTD Influenza Vaccine UTD Tetanus/tdap UTD  ROS: GEN- denies fatigue, fever, weight loss,weakness, recent illness HEENT- denies eye drainage, change in vision, nasal discharge, CVS- denies chest pain, palpitations RESP- denies SOB, cough, wheeze ABD- denies N/V, change in stools, abd pain GU- denies dysuria, hematuria, dribbling, incontinence MSK- denies joint pain, muscle aches, injury Neuro- denies headache, dizziness, syncope, seizure activity  Physical: vitals reviewed GEN- NAD, alert and oriented x3 HEENT- PERRL, EOMI, non injected sclera, pink conjunctiva, MMM, oropharynx clear, TM clear bilat no effusion Neck- Supple, no thryomegaly, no bruit  CVS- RRR, no murmur RESP-CTAB ABD-NABS,soft,NT,ND EXT- No  edema Pulses- Radial, DP- 2+   Assessment:    Annual wellness medicare exam   Plan:    During the course of the visit the patient was educated and counseled about appropriate screening and preventive services including:   FALL/AUDIT C/Depression screen negative    GERD/ history of GI bleed, continue PPI continues to benefit from this    Prostate cancer- followed by urology    Fasting labs    Full code.  Given handout on advanced directives  Vitamin D deficiency we'll recheck his vitamin stores continue  supplementation.  Mild hyperlipidemia trying to treat with diet  Diet review for nutrition referral? Yes ____ Not Indicated __x__  Patient Instructions (the written plan) was given to the patient.  Medicare Attestation  I have personally reviewed:  The patient's medical and social history  Their use of alcohol, tobacco or illicit drugs  Their current medications and supplements  The patient's functional ability including ADLs,fall risks, home safety risks, cognitive, and hearing and visual impairment  Diet and physical activities  Evidence for depression or mood disorders  The patient's weight, height, BMI, and visual acuity have been recorded in the chart. I have made referrals, counseling, and provided education to the patient based on review of the above and I have provided the patient with a written personalized care plan for preventive services.

## 2019-11-17 LAB — CBC WITH DIFFERENTIAL/PLATELET
Absolute Monocytes: 287 cells/uL (ref 200–950)
Basophils Absolute: 28 cells/uL (ref 0–200)
Basophils Relative: 0.8 %
Eosinophils Absolute: 168 cells/uL (ref 15–500)
Eosinophils Relative: 4.8 %
HCT: 42 % (ref 38.5–50.0)
Hemoglobin: 14 g/dL (ref 13.2–17.1)
Lymphs Abs: 1456 cells/uL (ref 850–3900)
MCH: 29.9 pg (ref 27.0–33.0)
MCHC: 33.3 g/dL (ref 32.0–36.0)
MCV: 89.6 fL (ref 80.0–100.0)
MPV: 12 fL (ref 7.5–12.5)
Monocytes Relative: 8.2 %
Neutro Abs: 1561 cells/uL (ref 1500–7800)
Neutrophils Relative %: 44.6 %
Platelets: 170 10*3/uL (ref 140–400)
RBC: 4.69 10*6/uL (ref 4.20–5.80)
RDW: 15.3 % — ABNORMAL HIGH (ref 11.0–15.0)
Total Lymphocyte: 41.6 %
WBC: 3.5 10*3/uL — ABNORMAL LOW (ref 3.8–10.8)

## 2019-11-17 LAB — COMPREHENSIVE METABOLIC PANEL
AG Ratio: 1.1 (calc) (ref 1.0–2.5)
ALT: 11 U/L (ref 9–46)
AST: 17 U/L (ref 10–35)
Albumin: 4 g/dL (ref 3.6–5.1)
Alkaline phosphatase (APISO): 136 U/L (ref 35–144)
BUN/Creatinine Ratio: 13 (calc) (ref 6–22)
BUN: 16 mg/dL (ref 7–25)
CO2: 28 mmol/L (ref 20–32)
Calcium: 9.7 mg/dL (ref 8.6–10.3)
Chloride: 104 mmol/L (ref 98–110)
Creat: 1.28 mg/dL — ABNORMAL HIGH (ref 0.70–1.25)
Globulin: 3.5 g/dL (calc) (ref 1.9–3.7)
Glucose, Bld: 94 mg/dL (ref 65–99)
Potassium: 4.3 mmol/L (ref 3.5–5.3)
Sodium: 141 mmol/L (ref 135–146)
Total Bilirubin: 0.5 mg/dL (ref 0.2–1.2)
Total Protein: 7.5 g/dL (ref 6.1–8.1)

## 2019-11-17 LAB — VITAMIN D 25 HYDROXY (VIT D DEFICIENCY, FRACTURES): Vit D, 25-Hydroxy: 29 ng/mL — ABNORMAL LOW (ref 30–100)

## 2019-11-17 LAB — LIPID PANEL
Cholesterol: 185 mg/dL (ref <200)
HDL: 56 mg/dL (ref >=40)
LDL Cholesterol (Calc): 112 mg/dL (calc) — ABNORMAL HIGH
Non-HDL Cholesterol (Calc): 129 mg/dL (calc) (ref <130)
Total CHOL/HDL Ratio: 3.3 (calc) (ref <5.0)
Triglycerides: 78 mg/dL (ref <150)

## 2019-11-22 ENCOUNTER — Encounter: Payer: Self-pay | Admitting: *Deleted

## 2019-11-30 ENCOUNTER — Other Ambulatory Visit: Payer: Self-pay

## 2019-11-30 ENCOUNTER — Encounter: Payer: Self-pay | Admitting: Family Medicine

## 2019-11-30 ENCOUNTER — Ambulatory Visit (INDEPENDENT_AMBULATORY_CARE_PROVIDER_SITE_OTHER): Payer: Medicare HMO | Admitting: Family Medicine

## 2019-11-30 ENCOUNTER — Ambulatory Visit (HOSPITAL_COMMUNITY)
Admission: RE | Admit: 2019-11-30 | Discharge: 2019-11-30 | Disposition: A | Discharge status: Home / Self Care | Payer: Medicare HMO | Source: Ambulatory Visit | Attending: Family Medicine | Admitting: Family Medicine

## 2019-11-30 VITALS — BP 126/80 | HR 60 | Temp 96.9°F | Resp 18 | Ht 74.0 in | Wt 182.0 lb

## 2019-11-30 DIAGNOSIS — C61 Malignant neoplasm of prostate: Secondary | ICD-10-CM | POA: Diagnosis not present

## 2019-11-30 DIAGNOSIS — Z86718 Personal history of other venous thrombosis and embolism: Secondary | ICD-10-CM | POA: Insufficient documentation

## 2019-11-30 DIAGNOSIS — M79662 Pain in left lower leg: Secondary | ICD-10-CM | POA: Diagnosis not present

## 2019-11-30 NOTE — Patient Instructions (Signed)
US to be done F/U pending results  

## 2019-11-30 NOTE — Progress Notes (Signed)
   Subjective:    Patient ID: Luis Stewart, male    DOB: 02-28-1951, 68 y.o.   MRN: [id number]  Patient presents for Left leg pain   He was asleep on a chase lounger and then woke up this AM and had severe cramp post calf of left leg, no swelling, no brusing, it eased some, but still aching and pain with walking. History of right DVT, also underlying prostate cancer  No SOB, no CP, checked oxygen at home this was normal      Review Of Systems:  GEN- denies fatigue, fever, weight loss,weakness, recent illness HEENT- denies eye drainage, change in vision, nasal discharge, CVS- denies chest pain, palpitations RESP- denies SOB, cough, wheeze ABD- denies N/V, change in stools, abd pain GU- denies dysuria, hematuria, dribbling, incontinence MSK- denies joint pain, +muscle aches, injury Neuro- denies headache, dizziness, syncope, seizure activity       Objective:    BP 126/80   Pulse 60   Temp (!) 96.9 F (36.1 C) (Other (Comment))   Resp 18   Ht 6\' 2"  (1.88 m)   Wt 182 lb (82.6 kg)   SpO2 97%   BMI 23.37 kg/m  GEN- NAD, alert and oriented x3 CVS- RRR, no murmur RESP-CTAB EXT- No edema, + homans left side, mild enlarged calf 34cm, compared to right 33cm, TTP Post calf, no veins palpated, no skin changes  Pulses- Radial, DP- 2+        Assessment & Plan:      Problem List Items Addressed This Visit      Unprioritized   History of DVT (deep vein thrombosis) - Primary    Acute calf pain , with cancer and history of DVT, obtain US to r/o DVT today , no current anti-coagulation      Relevant Orders   US Venous Img Lower Bilateral   Prostate cancer ([place])   Relevant Orders   US Venous Img Lower Bilateral    Other Visit Diagnoses    Pain of left calf       Relevant Orders   US Venous Img Lower Bilateral      Note: This dictation was prepared with Dragon dictation along with smaller phrase technology. Any transcriptional errors that result from this  process are unintentional.

## 2019-11-30 NOTE — Assessment & Plan Note (Signed)
Acute calf pain , with cancer and history of DVT, obtain US to r/o DVT today , no current anti-coagulation

## 2019-12-09 ENCOUNTER — Other Ambulatory Visit: Payer: Self-pay

## 2019-12-09 DIAGNOSIS — C61 Malignant neoplasm of prostate: Secondary | ICD-10-CM

## 2019-12-13 ENCOUNTER — Ambulatory Visit: Payer: Medicare HMO | Attending: Internal Medicine

## 2019-12-13 ENCOUNTER — Other Ambulatory Visit: Payer: Self-pay

## 2019-12-13 DIAGNOSIS — Z20822 Contact with and (suspected) exposure to covid-19: Secondary | ICD-10-CM

## 2019-12-13 DIAGNOSIS — Z20828 Contact with and (suspected) exposure to other viral communicable diseases: Secondary | ICD-10-CM | POA: Diagnosis not present

## 2019-12-15 LAB — NOVEL CORONAVIRUS, NAA: SARS-CoV-2, NAA: NOT DETECTED

## 2020-01-28 ENCOUNTER — Ambulatory Visit: Payer: Medicare HMO | Attending: Internal Medicine

## 2020-01-28 ENCOUNTER — Other Ambulatory Visit: Payer: Self-pay

## 2020-01-28 DIAGNOSIS — Z23 Encounter for immunization: Secondary | ICD-10-CM

## 2020-01-28 NOTE — Progress Notes (Signed)
   Covid-19 Vaccination Clinic  Name:  Luis Stewart    MRN: [id number] DOB: 1951-08-17  01/28/2020  Luis Stewart was observed post Covid-19 immunization for 15 minutes without incidence. He was provided with Vaccine Information Sheet and instruction to access the V-Safe system.   Luis Stewart was instructed to call 911 with any severe reactions post vaccine: Marland Kitchen Difficulty breathing  . Swelling of your face and throat  . A fast heartbeat  . A bad rash all over your body  . Dizziness and weakness    Immunizations Administered    Name Date Dose VIS Date Route   Moderna COVID-19 Vaccine 01/28/2020 10:32 AM 0.5 mL 11/22/2019 Intramuscular   Manufacturer: Moderna   Lot: [id number]   [id number]:[id number]

## 2020-02-28 ENCOUNTER — Ambulatory Visit: Payer: Medicare HMO | Attending: Emergency Medicine

## 2020-02-28 DIAGNOSIS — Z23 Encounter for immunization: Secondary | ICD-10-CM | POA: Insufficient documentation

## 2020-02-28 NOTE — Progress Notes (Signed)
   Covid-19 Vaccination Clinic  Name:  Luis Stewart    MRN: [id number] DOB: 11-17-1951  02/28/2020  [name] was observed post Covid-19 immunization for 15 minutes without incident. He was provided with Vaccine Information Sheet and instruction to access the V-Safe system.   [name] was instructed to call 911 with any severe reactions post vaccine: Marland Kitchen Difficulty breathing  . Swelling of face and throat  . A fast heartbeat  . A bad rash all over body  . Dizziness and weakness   Immunizations Administered    Name Date Dose VIS Date Route   Moderna COVID-19 Vaccine 02/28/2020 12:17 PM 0.5 mL 11/22/2019 Intramuscular   Manufacturer: Moderna   Lot: [id number]   [place]:[id number]

## 2020-03-10 DIAGNOSIS — Z833 Family history of diabetes mellitus: Secondary | ICD-10-CM | POA: Diagnosis not present

## 2020-03-10 DIAGNOSIS — M1990 Unspecified osteoarthritis, unspecified site: Secondary | ICD-10-CM | POA: Diagnosis not present

## 2020-03-10 DIAGNOSIS — R32 Unspecified urinary incontinence: Secondary | ICD-10-CM | POA: Diagnosis not present

## 2020-03-10 DIAGNOSIS — C61 Malignant neoplasm of prostate: Secondary | ICD-10-CM | POA: Diagnosis not present

## 2020-03-10 DIAGNOSIS — Z008 Encounter for other general examination: Secondary | ICD-10-CM | POA: Diagnosis not present

## 2020-03-10 DIAGNOSIS — Z8249 Family history of ischemic heart disease and other diseases of the circulatory system: Secondary | ICD-10-CM | POA: Diagnosis not present

## 2020-03-10 DIAGNOSIS — K219 Gastro-esophageal reflux disease without esophagitis: Secondary | ICD-10-CM | POA: Diagnosis not present

## 2020-03-10 DIAGNOSIS — Z882 Allergy status to sulfonamides status: Secondary | ICD-10-CM | POA: Diagnosis not present

## 2020-03-10 DIAGNOSIS — Z809 Family history of malignant neoplasm, unspecified: Secondary | ICD-10-CM | POA: Diagnosis not present

## 2020-03-13 ENCOUNTER — Ambulatory Visit: Payer: Medicare HMO | Admitting: Urology

## 2020-03-20 ENCOUNTER — Other Ambulatory Visit: Payer: Self-pay | Admitting: Urology

## 2020-03-20 DIAGNOSIS — C61 Malignant neoplasm of prostate: Secondary | ICD-10-CM | POA: Diagnosis not present

## 2020-03-21 LAB — PSA: PSA: 15.5 ng/mL — ABNORMAL HIGH (ref <=4.0)

## 2020-03-26 ENCOUNTER — Telehealth: Payer: Self-pay

## 2020-03-26 NOTE — Telephone Encounter (Signed)
Pt notified of results. appt on 4/6 to see MD>

## 2020-03-26 NOTE — Telephone Encounter (Signed)
-----   Message from Franchot Gallo, MD sent at 03/22/2020  9:56 AM EDT ----- Notify pt PSA stable @ 15.3. Does he have a f/u scheduled? ----- Message ----- From: Dorisann Frames, RN Sent: 03/21/2020  10:24 AM EDT To: Franchot Gallo, MD  Please review

## 2020-03-27 ENCOUNTER — Ambulatory Visit: Payer: Medicare HMO | Admitting: Urology

## 2020-03-27 ENCOUNTER — Encounter: Payer: Self-pay | Admitting: Urology

## 2020-03-27 ENCOUNTER — Other Ambulatory Visit: Payer: Self-pay

## 2020-03-27 VITALS — BP 112/63 | HR 61 | Temp 98.6°F | Ht 74.0 in | Wt 190.0 lb

## 2020-03-27 DIAGNOSIS — N401 Enlarged prostate with lower urinary tract symptoms: Secondary | ICD-10-CM | POA: Diagnosis not present

## 2020-03-27 DIAGNOSIS — C61 Malignant neoplasm of prostate: Secondary | ICD-10-CM | POA: Diagnosis not present

## 2020-03-27 DIAGNOSIS — R350 Frequency of micturition: Secondary | ICD-10-CM | POA: Diagnosis not present

## 2020-03-27 LAB — POCT URINALYSIS DIPSTICK
Bilirubin, UA: NEGATIVE
Blood, UA: NEGATIVE
Glucose, UA: NEGATIVE
Ketones, UA: NEGATIVE
Leukocytes, UA: NEGATIVE
Nitrite, UA: NEGATIVE
Protein, UA: NEGATIVE
Spec Grav, UA: 1.02 (ref 1.010–1.025)
Urobilinogen, UA: 0.2 E.U./dL
pH, UA: 5 (ref 5.0–8.0)

## 2020-03-27 NOTE — Progress Notes (Signed)
Urological Symptom Review  Patient is experiencing the following symptoms: Frequent urination Get up at night to urinate Stream starts and stops   Review of Systems  Gastrointestinal (upper)  : Indigestion/heartburn  Gastrointestinal (lower) : Negative for lower GI symptoms  Constitutional : Negative for symptoms  Skin: Skin rash/lesion  Eyes: Negative for eye symptoms  Ear/Nose/Throat : Sinus problems  Hematologic/Lymphatic: Negative for Hematologic/Lymphatic symptoms  Cardiovascular : Chest pain  Respiratory : Cough  Endocrine: Negative for endocrine symptoms  Musculoskeletal: Negative for musculoskeletal symptoms  Neurological: Negative for neurological symptoms  Psychologic: Negative for psychiatric symptoms

## 2020-03-27 NOTE — Progress Notes (Signed)
. H&P  Chief Complaint: Prostate Cancer  History of Present Illness:   4.6.2021: Most recent PSA was 15.5 (3.30.2021). Here for follow-up. No voiding complaints nor significant changes in overall urinary pattern/sx's since last visit.    IPSS Questionnaire (AUA-7): Over the past month.   1)  How often have you had a sensation of not emptying your bladder completely after you finish urinating?  1 - Less than 1 time in 5  2)  How often have you had to urinate again less than two hours after you finished urinating? 2 - Less than half the time  3)  How often have you found you stopped and started again several times when you urinated?  3 - About half the time  4) How difficult have you found it to postpone urination?  1 - Less than 1 time in 5  5) How often have you had a weak urinary stream?  1 - Less than 1 time in 5  6) How often have you had to push or strain to begin urination?  1 - Less than 1 time in 5  7) How many times did you most typically get up to urinate from the time you went to bed until the time you got up in the morning?  1 - 1 time  Total score:  0-7 mildly symptomatic   8-19 moderately symptomatic   20-35 severely symptomatic   Total: 10 QoL: 2  (below copied from [place] records):  Prostate Cancer:  Luis Stewart is a 69 year-old male established patient who is here for prostate cancer which has been treated.  He is participating in active surveillance. He has been under active surveillance for 06/14/2015. He has had a repeat biopsy.   Prior history of elevated PSA--s/p TRUS/Bx in [place] in 2005 and 2008. Apparently, all cores were negative but some were not indicative of prostate tissue at all.   Because of a high PCA 3 test in this office, he underwent ultrasound and biopsy of his prostate which was performed on 6.23.2016. 1/12 cores were positive for adenocarcinoma-10% of the core in the right lateral base revealed Gleason 3+3 equal 6 adenocarcinoma. Prostate  volume was 175 cc. PSA was 16.66. PSA density 0.09. He does not have significant lower urinary tract symptomatology.   He consulted with Dr. Louis Meckel in [place] to consider robotic prostatectomy. Due to his low volume prostate cancer, and very low PSA density, it was recommended that he continue with active surveillance.   Repeat ultrasound and biopsy of his prostate was done on 1.30.2017, following a prostatic MRI which revealed no suspicious prostatic lesions and correlated the patient's large prostate. At that time, all 12 cores were negative for prostate cancer. Prostatic volume was 190 mL.   8.21.2019: He presents for fusion TRUS/Bx. Recent prostate MRI--volume 220 ml. No significant lesions. No evidence transcapsular/perineural,SV/bony or lymphatic disease. Path--1 core (left apex) revealed GS 3+3 pattern in 5% of core.    02/15/19 04/19/18 05/15/17 11/04/16 10/09/15  PSA  Total PSA 15.9 ng/dl 18.4 ng/dl 17.9 ng/dl 15.4 ng/dl 17.16     Past Medical History:  Diagnosis Date  . Arthritis   . Asbestosis ([place])   . Cyst of kidney, acquired   . Headache   . History of fracture of leg    Right, childhood  . Hx of migraine headaches   . Hyperlipidemia   . Neuropathy   . PE (pulmonary thromboembolism) ([place])    After knee surgery  . Prostate  cancer [facility]) 2015   Adenocarcinoma by biopsy    Past Surgical History:  Procedure Laterality Date  . BIOPSY PROSTATE  2003 and 2005  . COLONOSCOPY  11/28/2008   Dr. Rourk:internal hemorrhoids/tortus colon/ascending colon polyps, tubular adenomas  . COLONOSCOPY N/A 09/04/2014   Procedure: COLONOSCOPY;  Surgeon: Daneil Dolin, MD;  [facility];  Service: Endoscopy;  Laterality: N/A;  9:30  . COLONOSCOPY N/A 07/17/2017   Procedure: COLONOSCOPY;  Surgeon: Danie Binder, MD;  [facility];  Service: Endoscopy;  Laterality: N/A;  . ESOPHAGOGASTRODUODENOSCOPY N/A 07/16/2017   Procedure: ESOPHAGOGASTRODUODENOSCOPY (EGD);   Surgeon: Danie Binder, MD;  [facility];  Service: Endoscopy;  Laterality: N/A;  . GIVENS CAPSULE STUDY  07/16/2017   Procedure: GIVENS CAPSULE STUDY;  Surgeon: Danie Binder, MD;  [facility];  Service: Endoscopy;;  . POLYPECTOMY  07/17/2017   Procedure: POLYPECTOMY;  Surgeon: Danie Binder, MD;  [facility];  Service: Endoscopy;;  cecal  . TOTAL KNEE ARTHROPLASTY Right 06/10/2017  . TOTAL KNEE ARTHROPLASTY Right 06/10/2017   Procedure: TOTAL KNEE ARTHROPLASTY;  Surgeon: [name], MD;  Location: [place];  Service: Orthopedics;  Laterality: Right;    Home Medications:  Allergies as of 03/27/2020      Reactions   Sulfa Antibiotics Other (See Comments)   Unknown reaction. Childhood reaction      Medication List       Accurate as of March 27, 2020 12:00 PM. If you have any questions, ask your nurse or doctor.        acetaminophen 500 MG tablet Commonly known as: TYLENOL Take 1,000 mg by mouth every 6 (six) hours as needed for mild pain or moderate pain.   multivitamin with minerals Tabs tablet Take 1 tablet by mouth daily. Centrum Silver for Men   nitroGLYCERIN 0.4 MG SL tablet Commonly known as: NITROSTAT Place 1 tablet (0.4 mg total) under the tongue every 5 (five) minutes as needed for chest pain.   pantoprazole 40 MG tablet Commonly known as: PROTONIX Take 1 tablet (40 mg total) by mouth daily before breakfast.   Vitamin D 50 MCG (2000 UT) Caps Take 2,000 Units by mouth 3 (three) times a week.       Allergies:  Allergies  Allergen Reactions  . Sulfa Antibiotics Other (See Comments)    Unknown reaction. Childhood reaction    Family History  Problem Relation Age of Onset  . Diabetes Mother   . Hypertension Mother   . Obesity Mother   . Heart disease Mother   . Mental illness Mother   . Hypertension Sister   . Obesity Sister   . Arthritis Sister   . Deep vein thrombosis Father   . Dementia Father   . Heart  disease Father        CABG  . Colon cancer Neg Hx     Social History:  reports that he has never smoked. He has never used smokeless tobacco. He reports that he does not drink alcohol or use drugs.  ROS: A complete review of systems was performed.  All systems are negative except for pertinent findings as noted.  Physical Exam:  Vital signs in last 24 hours: BP 112/63   Pulse 61   Temp 98.6 F (37 C)   Ht 6\' 2"  (1.88 m)   Wt 190 lb (86.2 kg)   BMI 24.39 kg/m  Constitutional:  Alert and oriented, No acute  distress Cardiovascular: Regular rate  Respiratory: Normal respiratory effort GI: Abdomen is soft, nontender, nondistended, no abdominal masses. No CVAT. No hernias. Genitourinary: Normal male phallus (uncircumcised), testes are descended bilaterally and non-tender and without masses, scrotum is normal in appearance without lesions or masses, perineum is normal on inspection. Prostate feels around 100 grams in size. Lymphatic: No lymphadenopathy Neurologic: Grossly intact, no focal deficits Psychiatric: Normal mood and affect  Laboratory Data:  No results for input(s): WBC, HGB, HCT, PLT in the last 72 hours.  No results for input(s): NA, K, CL, GLUCOSE, BUN, CALCIUM, CREATININE in the last 72 hours.  Invalid input(s): CO3   Results for orders placed or performed in visit on 03/27/20 (from the past 24 hour(s))  POCT urinalysis dipstick     Status: None   Collection Time: 03/27/20 11:38 AM  Result Value Ref Range   Color, UA yellow    Clarity, UA     Glucose, UA Negative Negative   Bilirubin, UA neg    Ketones, UA neg    Spec Grav, UA 1.020 1.010 - 1.025   Blood, UA neg    pH, UA 5.0 5.0 - 8.0   Protein, UA Negative Negative   Urobilinogen, UA 0.2 0.2 or 1.0 E.U./dL   Nitrite, UA neg    Leukocytes, UA Negative Negative   Appearance clear    Odor     No results found for this or any previous visit (from the past 240 hour(s)).  I have reviewed prior pt  notes/pathology  I have reviewed notes from referring/previous physicians  I have reviewed urinalysis results  I have reviewed prior PSA results   Impression/Assessment:  His PSA is elevated but this has remained stable. Prior bx's have been either negative or only positive in only 1 or 2/12 cores all Gleason 3+3. I think it would be appropriate to follow-up with a prostate MRI and only repeat bx if this shows areas of suspicion.   BPH w/ stable sx's  Plan:  1. Prostate MRI in 6 mo's -- will call w/ results  2. Pending results, may consider repeating bx but will otherwise continue to follow him annually with q 6 mo PSA checks.  3. Default OV in 1 yr w/ PSA  4. PSA + BUN/Creatinine labs in 6 mo's.

## 2020-04-13 ENCOUNTER — Other Ambulatory Visit: Payer: Self-pay

## 2020-04-13 ENCOUNTER — Encounter (HOSPITAL_COMMUNITY): Payer: Self-pay | Admitting: Emergency Medicine

## 2020-04-13 ENCOUNTER — Emergency Department (HOSPITAL_COMMUNITY)
Admission: EM | Admit: 2020-04-13 | Discharge: 2020-04-14 | Disposition: A | Discharge status: Home / Self Care | Payer: Medicare HMO | Attending: Emergency Medicine | Admitting: Emergency Medicine

## 2020-04-13 ENCOUNTER — Emergency Department (HOSPITAL_COMMUNITY): Payer: Medicare HMO

## 2020-04-13 DIAGNOSIS — Z79899 Other long term (current) drug therapy: Secondary | ICD-10-CM | POA: Insufficient documentation

## 2020-04-13 DIAGNOSIS — X509XXA Other and unspecified overexertion or strenuous movements or postures, initial encounter: Secondary | ICD-10-CM | POA: Diagnosis not present

## 2020-04-13 DIAGNOSIS — Z8546 Personal history of malignant neoplasm of prostate: Secondary | ICD-10-CM | POA: Diagnosis not present

## 2020-04-13 DIAGNOSIS — S8264XA Nondisplaced fracture of lateral malleolus of right fibula, initial encounter for closed fracture: Secondary | ICD-10-CM | POA: Diagnosis not present

## 2020-04-13 DIAGNOSIS — Z96651 Presence of right artificial knee joint: Secondary | ICD-10-CM | POA: Insufficient documentation

## 2020-04-13 DIAGNOSIS — Y92008 Other place in unspecified non-institutional (private) residence as the place of occurrence of the external cause: Secondary | ICD-10-CM | POA: Insufficient documentation

## 2020-04-13 DIAGNOSIS — Y9301 Activity, walking, marching and hiking: Secondary | ICD-10-CM | POA: Insufficient documentation

## 2020-04-13 DIAGNOSIS — Y999 Unspecified external cause status: Secondary | ICD-10-CM | POA: Insufficient documentation

## 2020-04-13 DIAGNOSIS — S8261XA Displaced fracture of lateral malleolus of right fibula, initial encounter for closed fracture: Secondary | ICD-10-CM

## 2020-04-13 DIAGNOSIS — S99912A Unspecified injury of left ankle, initial encounter: Secondary | ICD-10-CM | POA: Diagnosis present

## 2020-04-13 MED ORDER — OXYCODONE-ACETAMINOPHEN 5-325 MG PO TABS
1.0000 | ORAL_TABLET | Freq: Once | ORAL | Status: AC
  Administered 2020-04-13: 1 via ORAL
  Filled 2020-04-13: qty 1

## 2020-04-13 MED ORDER — HYDROCODONE-ACETAMINOPHEN 5-325 MG PO TABS: 1.0000 | ORAL_TABLET | Freq: Three times a day (TID) | ORAL | 0 refills | Status: DC | PRN

## 2020-04-13 NOTE — ED Provider Notes (Signed)
[facility] EMERGENCY DEPARTMENT Provider Note   CSN: [id number] Arrival date & time: 04/13/20  2002     History Chief Complaint  Patient presents with  . Ankle Injury    right    Luis Stewart is a 69 y.o. male who presents for evaluation of right ankle pain and swelling that began about 7 PM after mechanical fall.  He reports that he was outside and was walking up the deck and states that he missed the step, causing him to have an inversion injury of his right ankle.  Since then, he has had pain, difficulty with ambulating, bearing weight.  No numbness/weakness.  He has not taken anything for the pain.  He denies any head injury, LOC.  The history is provided by the patient.       Past Medical History:  Diagnosis Date  . Arthritis   . Asbestosis ([place])   . Cyst of kidney, acquired   . Headache   . History of fracture of leg    Right, childhood  . Hx of migraine headaches   . Hyperlipidemia   . Neuropathy   . PE (pulmonary thromboembolism) ([place])    After knee surgery  . Prostate cancer ([place]) 2015   Adenocarcinoma by biopsy    Patient Active Problem List   Diagnosis Date Noted  . ED (erectile dysfunction) 05/11/2019  . History of DVT (deep vein thrombosis) 04/16/2018  . GERD (gastroesophageal reflux disease) 10/15/2017  . Pulmonary nodule 07/19/2017  . Constipation 07/15/2017  . History of pulmonary embolism 06/18/2017  . Primary localized osteoarthritis of right knee 06/10/2017  . Upper airway cough syndrome 08/29/2015  . Prostate cancer ([place]) 08/17/2015  . Exposure to asbestos 08/17/2015  . Personal history of colonic polyps 08/08/2014  . Joint pain 06/05/2014  . Vitamin D deficiency 06/05/2014  . BPH (benign prostatic hypertrophy) 06/01/2013  . Mild hyperlipidemia 06/01/2013  . Routine general medical examination at a health care facility 06/01/2013    Past Surgical History:  Procedure Laterality Date  . BIOPSY PROSTATE  2003 and 2005  .  COLONOSCOPY  11/28/2008   Dr. Rourk:internal hemorrhoids/tortus colon/ascending colon polyps, tubular adenomas  . COLONOSCOPY N/A 09/04/2014   Procedure: COLONOSCOPY;  Surgeon: Daneil Dolin, MD;  [facility];  Service: Endoscopy;  Laterality: N/A;  9:30  . COLONOSCOPY N/A 07/17/2017   Procedure: COLONOSCOPY;  Surgeon: Danie Binder, MD;  [facility];  Service: Endoscopy;  Laterality: N/A;  . ESOPHAGOGASTRODUODENOSCOPY N/A 07/16/2017   Procedure: ESOPHAGOGASTRODUODENOSCOPY (EGD);  Surgeon: Danie Binder, MD;  [facility];  Service: Endoscopy;  Laterality: N/A;  . GIVENS CAPSULE STUDY  07/16/2017   Procedure: GIVENS CAPSULE STUDY;  Surgeon: Danie Binder, MD;  [facility];  Service: Endoscopy;;  . POLYPECTOMY  07/17/2017   Procedure: POLYPECTOMY;  Surgeon: Danie Binder, MD;  [facility];  Service: Endoscopy;;  cecal  . TOTAL KNEE ARTHROPLASTY Right 06/10/2017  . TOTAL KNEE ARTHROPLASTY Right 06/10/2017   Procedure: TOTAL KNEE ARTHROPLASTY;  Surgeon: [name], MD;  Location: [place];  Service: Orthopedics;  Laterality: Right;       Family History  Problem Relation Age of Onset  . Diabetes Mother   . Hypertension Mother   . Obesity Mother   . Heart disease Mother   . Mental illness Mother   . Hypertension Sister   . Obesity Sister   . Arthritis Sister   . Deep  vein thrombosis Father   . Dementia Father   . Heart disease Father        CABG  . Colon cancer Neg Hx     Social History   Tobacco Use  . Smoking status: Never Smoker  . Smokeless tobacco: Never Used  Substance Use Topics  . Alcohol use: No  . Drug use: No    Home Medications Prior to Admission medications   Medication Sig Start Date End Date Taking? Authorizing Provider  acetaminophen (TYLENOL) 500 MG tablet Take 1,000 mg by mouth every 6 (six) hours as needed for mild pain or moderate pain.    [provider]  Cholecalciferol  (VITAMIN D) 2000 units CAPS Take 2,000 Units by mouth 3 (three) times a week.     [provider]  HYDROcodone-acetaminophen (NORCO/VICODIN) 5-325 MG tablet Take 1-2 tablets by mouth every 8 (eight) hours as needed. 04/13/20   Volanda Napoleon, PA-C  Multiple Vitamin (MULTIVITAMIN WITH MINERALS) TABS tablet Take 1 tablet by mouth daily. Centrum Silver for Men    [provider]  nitroGLYCERIN (NITROSTAT) 0.4 MG SL tablet Place 1 tablet (0.4 mg total) under the tongue every 5 (five) minutes as needed for chest pain. 08/14/18   Noemi Chapel, MD  pantoprazole (PROTONIX) 40 MG tablet Take 1 tablet (40 mg total) by mouth daily before breakfast. 11/16/19   Alycia Rossetti, MD    Allergies    Sulfa antibiotics  Review of Systems   Review of Systems  Gastrointestinal: Negative for vomiting.  Musculoskeletal:       Ankle pain  Neurological: Negative for weakness and numbness.  All other systems reviewed and are negative.   Physical Exam Updated Vital Signs BP (!) 141/75 (BP Location: Right Arm)   Pulse 71   Temp 98.4 F (36.9 C) (Oral)   Resp 18   Ht 6\' 2"  (1.88 m)   Wt 86.2 kg   SpO2 99%   BMI 24.39 kg/m   Physical Exam Vitals and nursing note reviewed.  Constitutional:      Appearance: He is well-developed.  HENT:     Head: Normocephalic and atraumatic.  Eyes:     General: No scleral icterus.       Right eye: No discharge.        Left eye: No discharge.     Conjunctiva/sclera: Conjunctivae normal.  Cardiovascular:     Pulses:          Dorsalis pedis pulses are 2+ on the right side and 2+ on the left side.  Pulmonary:     Effort: Pulmonary effort is normal.  Musculoskeletal:     Cervical back: Normal range of motion.     Comments: Tenderness palpation noted to the lateral aspect of the right ankle with overlying soft tissue swelling.  No deformity or crepitus noted.  Limited range of motion secondary to pain.  He can wiggle all 5 toes.  Dorsiflexion  plantarflexion intact.  No bony tenderness noted to distal tib-fib, proximal tib-fib.  No tenderness palpation noted left lower extremity.  Skin:    General: Skin is warm and dry.     Capillary Refill: Capillary refill takes less than 2 seconds.     Comments: The skin is intact to ankle/foot.  The foot is warm and well perfused with intact sensation  Neurological:     Mental Status: He is alert.     Comments: Sensation intact throughout all major nerve distributions of the feet  Psychiatric:        Speech: Speech normal.        Behavior: Behavior normal.     ED Results / Procedures / Treatments   Labs (all labs ordered are listed, but only abnormal results are displayed) Labs Reviewed - No data to display  EKG None  Radiology DG Ankle Complete Right  Result Date: 04/13/2020 CLINICAL DATA:  Status post trauma. EXAM: RIGHT ANKLE - COMPLETE 3+ VIEW COMPARISON:  None. FINDINGS: There is a nondisplaced fracture of the right lateral malleolus without evidence of an associated dislocation or joint effusion. Mild to moderate severity degenerative changes seen along the dorsal aspect of the proximal right foot. Moderate severity lateral soft tissue swelling is seen. IMPRESSION: Nondisplaced fracture of the right lateral malleolus. Electronically Signed   By: Virgina Norfolk M.D.   On: 04/13/2020 21:43    Procedures Procedures (including critical care time)  Medications Ordered in ED Medications  oxyCODONE-acetaminophen (PERCOCET/ROXICET) 5-325 MG per tablet 1 tablet (1 tablet Oral Given 04/13/20 2259)    ED Course  I have reviewed the triage vital signs and the nursing notes.  Pertinent labs & imaging results that were available during my care of the patient were reviewed by me and considered in my medical decision making (see chart for details).    MDM Rules/Calculators/A&P                      69 year old male who presents for evaluation of right ankle pain and swelling after  mechanical fall that occurred earlier today.  Reports he had an inversion injury of his right ankle.  No head injury. Patient is afebril, non-toxic appearing, sitting comfortably on examination table. Vital signs reviewed and stable.  Patient is neurovascularly intact.  On exam, he has tenderness palpation noted of the lateral malleolus of the right ankle with overlying soft tissue swelling.  Concern for fracture versus dislocation.  X-rays ordered at triage.  X-rays reviewed.  There is a nondisplaced fracture of the right lateral malleolus.  Discussed results with patient.  We will put him in a short leg splint.  Patient has followed up with [facility] performed.  Instructed patient that he will need to follow-up with them.  Reevaluation after splint placement.  Patient with good distal sensation and cap refill.  Patient given crutches.  Instructed him to be nonweightbearing.  Patient will be given a short course of pain medication for acute breakthrough pain.  Discussed with him that he will need to have follow-up with Ortho. At this time, patient exhibits no emergent life-threatening condition that require further evaluation in ED or admission. Patient had ample opportunity for questions and discussion. All patient's questions were answered with full understanding. Strict return precautions discussed. Patient expresses understanding and agreement to plan.   Portions of this note were generated with [employer]. Dictation errors may occur despite best attempts at proofreading.   Final Clinical Impression(s) / ED Diagnoses Final diagnoses:  Closed fracture of proximal lateral malleolus of right fibula, initial encounter    Rx / DC Orders ED Discharge Orders         Ordered    HYDROcodone-acetaminophen (NORCO/VICODIN) 5-325 MG tablet  Every 8 hours PRN     04/13/20 2314           Volanda Napoleon, PA-C 04/13/20 2342    Noemi Chapel, MD 04/14/20 [phone]

## 2020-04-13 NOTE — ED Triage Notes (Signed)
Patient sates right ankle injury. Patient states that he twisted his right ankle when he got off his lawn mower. Ankle has obvious swelling.

## 2020-04-13 NOTE — Discharge Instructions (Signed)
You can take Tylenol or Ibuprofen as directed for pain. You can alternate Tylenol and Ibuprofen every 4 hours. If you take Tylenol at 1pm, then you can take Ibuprofen at 5pm. Then you can take Tylenol again at 9pm.   Take pain medications as directed for break through pain. Do not drive or operate machinery while taking this medication.   Keep the foot elevated to help with swelling.  Do not get the splint wet.  As we discussed, you will need to follow-up with orthopedics.  Call either Dr. Aline Brochure or Kari Baars office.  Return emergency department for any worsening pain, discoloration of your toes, numbness/weakness or any other worsening or concerning symptoms.

## 2020-04-18 ENCOUNTER — Encounter: Payer: Self-pay | Admitting: Orthopedic Surgery

## 2020-04-18 ENCOUNTER — Ambulatory Visit: Payer: Medicare HMO | Admitting: Orthopedic Surgery

## 2020-04-18 ENCOUNTER — Other Ambulatory Visit: Payer: Self-pay

## 2020-04-18 VITALS — BP 143/78 | HR 81 | Ht 74.0 in | Wt 190.0 lb

## 2020-04-18 DIAGNOSIS — S82831A Other fracture of upper and lower end of right fibula, initial encounter for closed fracture: Secondary | ICD-10-CM | POA: Diagnosis not present

## 2020-04-18 DIAGNOSIS — S82832A Other fracture of upper and lower end of left fibula, initial encounter for closed fracture: Secondary | ICD-10-CM | POA: Insufficient documentation

## 2020-04-18 NOTE — Progress Notes (Signed)
EMERGENCY ROOM FOLLOW UP  NEW PROBLEM/PATIENT   Patient ID: Luis Stewart, male   DOB: 1951-04-17, 69 y.o.   MRN: [id number]  Emergency room record from (date) April 23 has been reviewed and this is included by reference and includes the review of systems with the following addition:   Chief Complaint  Patient presents with  . Ankle Injury    twisted 04/13/20 left ankle     HPI Luis Stewart is a 69 y.o. male.  ER follow-up evaluation for Weber a ankle fracture right ankle  40 male stepped off his lawnmower twisted his ankle history of clots after total knee took Xarelto complicated by excessive bleeding switch to Eliquis  He does complain of some calf pain since his injury complains of pain over his fifth metatarsal proximally and fills the distal fibula and swelling of the foot   Review of Systems Review of Systems  Cardiovascular: Positive for chest pain.  Musculoskeletal: Positive for arthralgias.  Neurological: Positive for numbness.  All other systems reviewed and are negative.    has a past medical history of Arthritis, Asbestosis ([place]), Cyst of kidney, acquired, Headache, History of fracture of leg, migraine headaches, Hyperlipidemia, Neuropathy, PE (pulmonary thromboembolism) ([place]), and Prostate cancer ([place]) (2015).   Past Surgical History:  Procedure Laterality Date  . BIOPSY PROSTATE  2003 and 2005  . COLONOSCOPY  11/28/2008   Dr. Rourk:internal hemorrhoids/tortus colon/ascending colon polyps, tubular adenomas  . COLONOSCOPY N/A 09/04/2014   Procedure: COLONOSCOPY;  Surgeon: Daneil Dolin, MD;  [facility];  Service: Endoscopy;  Laterality: N/A;  9:30  . COLONOSCOPY N/A 07/17/2017   Procedure: COLONOSCOPY;  Surgeon: Danie Binder, MD;  [facility];  Service: Endoscopy;  Laterality: N/A;  . ESOPHAGOGASTRODUODENOSCOPY N/A 07/16/2017   Procedure: ESOPHAGOGASTRODUODENOSCOPY (EGD);  Surgeon: Danie Binder, MD;  [facility];  Service: Endoscopy;  Laterality: N/A;  . GIVENS CAPSULE STUDY  07/16/2017   Procedure: GIVENS CAPSULE STUDY;  Surgeon: Danie Binder, MD;  [facility];  Service: Endoscopy;;  . POLYPECTOMY  07/17/2017   Procedure: POLYPECTOMY;  Surgeon: Danie Binder, MD;  [facility];  Service: Endoscopy;;  cecal  . TOTAL KNEE ARTHROPLASTY Right 06/10/2017  . TOTAL KNEE ARTHROPLASTY Right 06/10/2017   Procedure: TOTAL KNEE ARTHROPLASTY;  Surgeon: [name], MD;  Location: [place];  Service: Orthopedics;  Laterality: Right;    Family History  Problem Relation Age of Onset  . Diabetes Mother   . Hypertension Mother   . Obesity Mother   . Heart disease Mother   . Mental illness Mother   . Hypertension Sister   . Obesity Sister   . Arthritis Sister   . Deep vein thrombosis Father   . Dementia Father   . Heart disease Father        CABG  . Colon cancer Neg Hx     Social History Social History   Tobacco Use  . Smoking status: Never Smoker  . Smokeless tobacco: Never Used  Substance Use Topics  . Alcohol use: No  . Drug use: No    Allergies  Allergen Reactions  . Sulfa Antibiotics Other (See Comments)    Unknown reaction. Childhood reaction    Current Outpatient Medications  Medication Sig Dispense Refill  . acetaminophen (TYLENOL) 500 MG tablet Take 1,000 mg by mouth every 6 (six) hours as needed for mild pain or moderate pain.    Marland Kitchen  Cholecalciferol (VITAMIN D) 2000 units CAPS Take 2,000 Units by mouth 3 (three) times a week.     Marland Kitchen HYDROcodone-acetaminophen (NORCO/VICODIN) 5-325 MG tablet Take 1-2 tablets by mouth every 8 (eight) hours as needed. 10 tablet 0  . Multiple Vitamin (MULTIVITAMIN WITH MINERALS) TABS tablet Take 1 tablet by mouth daily. Centrum Silver for Men    . nitroGLYCERIN (NITROSTAT) 0.4 MG SL tablet Place 1 tablet (0.4 mg total) under the tongue every 5 (five) minutes as needed for chest pain. 30 tablet 0  . pantoprazole  (PROTONIX) 40 MG tablet Take 1 tablet (40 mg total) by mouth daily before breakfast. 90 tablet 3   No current facility-administered medications for this visit.    Physical Exam BP (!) 143/78   Pulse 81   Ht 6\' 2"  (1.88 m)   Wt 190 lb (86.2 kg)   BMI 24.39 kg/m  Body mass index is 24.39 kg/m.  Well developed and well nourished  Alert and oriented x 3  Normal affect and mood  Ortho Exam  Tenderness over the right ankle at the lateral malleolus swelling of the right foot and tenderness of the proximal fifth metatarsal  No medial tenderness.  No calf tenderness.  No swelling normal muscle tone and strength etc.  Decreased range of motion secondary to pain  No engorgement of the veins no extra swelling above where the injury is  Data Reviewed IMAGING From THE ER AND THE REPORT ARE REVIEWED, MY INTERPRETATION OF THE IMAGE(S) IS :  Weber a ankle fracture ankle mortise intact  Assessment  Weber a fracture right ankle  Plan   Walking boot weight-bear as tolerated x-ray in 6 weeks Acute injury uncomplicated, independent x-ray read.  Low risk of morbidity  Arther Abbott, MD 04/18/2020 10:52 AM

## 2020-05-28 DIAGNOSIS — S82839D Other fracture of upper and lower end of unspecified fibula, subsequent encounter for closed fracture with routine healing: Secondary | ICD-10-CM | POA: Insufficient documentation

## 2020-05-28 DIAGNOSIS — S82309D Unspecified fracture of lower end of unspecified tibia, subsequent encounter for closed fracture with routine healing: Secondary | ICD-10-CM

## 2020-05-28 HISTORY — DX: Other fracture of upper and lower end of unspecified fibula, subsequent encounter for closed fracture with routine healing: S82.309D

## 2020-05-30 ENCOUNTER — Other Ambulatory Visit: Payer: Self-pay

## 2020-05-30 ENCOUNTER — Encounter: Payer: Self-pay | Admitting: Orthopedic Surgery

## 2020-05-30 ENCOUNTER — Ambulatory Visit (INDEPENDENT_AMBULATORY_CARE_PROVIDER_SITE_OTHER): Payer: Medicare HMO | Admitting: Orthopedic Surgery

## 2020-05-30 ENCOUNTER — Ambulatory Visit: Payer: Medicare HMO

## 2020-05-30 DIAGNOSIS — S82831D Other fracture of upper and lower end of right fibula, subsequent encounter for closed fracture with routine healing: Secondary | ICD-10-CM | POA: Diagnosis not present

## 2020-05-30 DIAGNOSIS — S82301D Unspecified fracture of lower end of right tibia, subsequent encounter for closed fracture with routine healing: Secondary | ICD-10-CM | POA: Diagnosis not present

## 2020-05-30 NOTE — Progress Notes (Signed)
Chief Complaint  Patient presents with  . Ankle Injury    right fibula fracture 04/13/20 improving     Status post right ankle Weber a fracture distal fibula doing well he has been able to walk without the boot  Exam is unremarkable x-rays show fracture is resolving patient is released to normal activity  Encounter Diagnosis  Name Primary?  . Closed fracture of distal end of right fibula and tibia with routine healing, subsequent encounter 04/13/20 Yes

## 2020-08-30 ENCOUNTER — Other Ambulatory Visit: Payer: Medicare HMO

## 2020-08-30 ENCOUNTER — Other Ambulatory Visit: Payer: Self-pay

## 2020-08-30 DIAGNOSIS — Z20822 Contact with and (suspected) exposure to covid-19: Secondary | ICD-10-CM

## 2020-09-01 LAB — NOVEL CORONAVIRUS, NAA: SARS-CoV-2, NAA: NOT DETECTED

## 2020-09-01 LAB — SARS-COV-2, NAA 2 DAY TAT

## 2020-09-12 ENCOUNTER — Ambulatory Visit (INDEPENDENT_AMBULATORY_CARE_PROVIDER_SITE_OTHER): Payer: Medicare HMO

## 2020-09-12 ENCOUNTER — Other Ambulatory Visit: Payer: Self-pay

## 2020-09-12 DIAGNOSIS — Z23 Encounter for immunization: Secondary | ICD-10-CM

## 2020-09-14 ENCOUNTER — Ambulatory Visit (HOSPITAL_COMMUNITY)
Admission: RE | Admit: 2020-09-14 | Discharge: 2020-09-14 | Disposition: A | Discharge status: Home / Self Care | Payer: Medicare HMO | Source: Ambulatory Visit | Attending: Urology | Admitting: Urology

## 2020-09-14 ENCOUNTER — Other Ambulatory Visit: Payer: Self-pay

## 2020-09-14 DIAGNOSIS — C61 Malignant neoplasm of prostate: Secondary | ICD-10-CM | POA: Diagnosis not present

## 2020-09-14 MED ORDER — GADOBUTROL 1 MMOL/ML IV SOLN
8.0000 mL | Freq: Once | INTRAVENOUS | Status: AC | PRN
  Administered 2020-09-14: 8 mL via INTRAVENOUS

## 2020-09-18 NOTE — Progress Notes (Signed)
Sent via mychart

## 2020-10-23 DIAGNOSIS — H5200 Hypermetropia, unspecified eye: Secondary | ICD-10-CM | POA: Diagnosis not present

## 2020-10-23 DIAGNOSIS — Z0100 Encounter for examination of eyes and vision without abnormal findings: Secondary | ICD-10-CM | POA: Diagnosis not present

## 2020-10-26 ENCOUNTER — Other Ambulatory Visit: Payer: Self-pay | Admitting: Internal Medicine

## 2020-10-26 DIAGNOSIS — R911 Solitary pulmonary nodule: Secondary | ICD-10-CM

## 2020-10-29 ENCOUNTER — Ambulatory Visit (INDEPENDENT_AMBULATORY_CARE_PROVIDER_SITE_OTHER): Payer: No Typology Code available for payment source | Admitting: Internal Medicine

## 2020-10-29 ENCOUNTER — Encounter: Payer: Self-pay | Admitting: Internal Medicine

## 2020-10-29 ENCOUNTER — Ambulatory Visit (INDEPENDENT_AMBULATORY_CARE_PROVIDER_SITE_OTHER): Payer: No Typology Code available for payment source

## 2020-10-29 ENCOUNTER — Other Ambulatory Visit: Payer: Self-pay

## 2020-10-29 DIAGNOSIS — Z77090 Contact with and (suspected) exposure to asbestos: Secondary | ICD-10-CM

## 2020-10-29 DIAGNOSIS — R911 Solitary pulmonary nodule: Secondary | ICD-10-CM | POA: Diagnosis not present

## 2020-10-29 LAB — PULMONARY FUNCTION TEST
DL/VA % pred: 123 %
DL/VA: 4.92 ml/min/mmHg/L
DLCO cor % pred: 98 %
DLCO cor: 28.7 ml/min/mmHg
DLCO unc % pred: 98 %
DLCO unc: 28.7 ml/min/mmHg
FEF 25-75 Post: 3.27 L/sec
FEF 25-75 Pre: 2.77 L/sec
FEF2575-%Change-Post: 18 %
FEF2575-%Pred-Post: 113 %
FEF2575-%Pred-Pre: 96 %
FEV1-%Change-Post: 2 %
FEV1-%Pred-Post: 92 %
FEV1-%Pred-Pre: 89 %
FEV1-Post: 3.12 L
FEV1-Pre: 3.04 L
FEV1FVC-%Change-Post: 8 %
FEV1FVC-%Pred-Pre: 103 %
FEV6-%Change-Post: -5 %
FEV6-%Pred-Post: 83 %
FEV6-%Pred-Pre: 89 %
FEV6-Post: 3.58 L
FEV6-Pre: 3.8 L
FEV6FVC-%Change-Post: 0 %
FEV6FVC-%Pred-Post: 103 %
FEV6FVC-%Pred-Pre: 103 %
FVC-%Change-Post: -5 %
FVC-%Pred-Post: 81 %
FVC-%Pred-Pre: 86 %
FVC-Post: 3.63 L
FVC-Pre: 3.84 L
Post FEV1/FVC ratio: 86 %
Post FEV6/FVC ratio: 99 %
Pre FEV1/FVC ratio: 79 %
Pre FEV6/FVC Ratio: 99 %
RV % pred: 83 %
RV: 2.23 L
TLC % pred: 78 %
TLC: 6.12 L

## 2020-10-29 NOTE — Progress Notes (Signed)
PFT done today. 

## 2020-10-29 NOTE — Assessment & Plan Note (Addendum)
Last significant exposure probably 1980/ never smoker HRCT   09/27/15  1. No evidence of asbestos related pleural disease. 2. No evidence of interstitial lung disease. Specifically, no significant regions of subpleural reticulation, ground-glass attenuation, traction bronchiectasis or frank honeycombing. 3. Mild air trapping, suggestive of nonspecific mild small airway disease. 4. Solitary 4 mm right middle lobe pulmonary nodule with a benign Appearance. - PFT's 09/27/2015 wnl with FVC  4.33  97/96%  > 121 %  Corrected for alv vol  - PFTs  10/20/2017  wnl     FVC   3.87(85%)  With dlco 83/92c and corrects to 124% for alv vol - CTa 10/06/17 neg for asbestos changes  - PFT's  10/25/2018  FVC  3.97 (88 % ) ratio nl  p no % improvement from saba p nothing prior to study with DLCO  82 % corrects to 119  % for alv volume   - CT chest 10/29/18 >>> No suspicious pulmonary nodule or mass within the visualized lung parenchyma. No visualized pleural effusion. No worrisome or suspicious osseous abnormality.  PFT's  10/28/2019  FVC 4.06 (90 % ) ratio 0.83  with DLCO  30.66 (104%) corrects to 5.23 (130%)   PFT's  10/29/2020  FVC  3.84 with DLCO  28.70 (98%) corrects to 4.92 (122%)  for alv volume    No evidence clinically nor radiographically of any form of asbestos injury/adverse effects to date  F/u yearly as req  Discussed in detail all the  indications, usual  risks and alternatives  relative to the benefits with patient who agrees to proceed with conservative f/u as outlined           Each maintenance medication was reviewed in detail including emphasizing most importantly the difference between maintenance and prns and under what circumstances the prns are to be triggered using an action plan format where appropriate.  Total time for H and P, chart review, counseling,  and generating customized AVS unique to this office visit / charting = 26 min

## 2020-10-29 NOTE — Patient Instructions (Signed)
Stay as active physically as possible   Please schedule a follow up visit in 12 months but call sooner if needed with pfts on return

## 2020-10-29 NOTE — Progress Notes (Signed)
Subjective:   Patient ID: Luis Stewart, male    DOB: Jun 14, 1951    MRN: [id number]    Brief patient profile:  3 yobm from [place]  never smoker worked for [employer] until 2003 last exposed abestos around 1980 with "abn" cxr 2014 referred by Dr Buelah Manis to pulmonary clinic 08/28/2015 with rec from [facility] to get pfts yearly but no evidence of any asbestosis related dz on CT as recently as  10/25/2018    History of Present Illness  08/28/2015 1st [facility] office visit/ Luis Stewart   Chief Complaint  Patient presents with  . Pulmonary Consult    Referred by Dr. Vic Blackbird. Pt has hx of asbestos exposure. He states that he is overall doing well. He does have some prod cough with clear to yellow sputum.    eval by Dr Beverly Gust with ? increased int markings but no f/u ct to date  Am cough x decades/ sensation of pnds / has not tried otc's rec Please see patient coordinator before you leave today  to schedule HRCT chest and return here same day with pfts on return  Try zyrtec 10 mg at bedtime to see if your daily am cough is better if now I recommend For drainage take chlortrimeton (chlorpheniramine) 4 mg every 4 hours available over the counter (may cause drowsiness)    09/27/2015  f/u ov/Luis Stewart re: asbestos f/u  Chief Complaint  Patient presents with  . Follow-up    Hx of asbestos exposure. Pt states that hsi breathing is doing well. Pt c/o of occasional wet cough, yellow to clear mucus. Pt denies wheeze/CP/SOB.    Not limited by breathing from desired activities  rec Try zyrtec 10 mg at bedtime to see if your daily am cough is better if not I recommend For drainage take chlortrimeton (chlorpheniramine) 4 mg every 4 hours available over the counter (may cause drowsiness)    10/20/2016  f/u ov/Luis Stewart re: asbestos f/u  Chief Complaint  Patient presents with  . Follow-up    PFT's and cxr done today. He c/o soreness in his chest for the past year. He has had some PND  and sore throat for the past day.   not using otc's as rec "too sleepy" but actually did work  rec Return in one year for pfts and cxr  - call for earlier follow up for cough or breathing issues  Try clariton or allergra as needed for drainage but try not to clear throat  GERD diet    10/20/2017  f/u ov/Luis Stewart re: asbestos f/u  Chief Complaint  Patient presents with  . Follow-up    PFT today prior to visit. Pt has productive cough-clear. Pt has SOB, wheezing with cough. Pt had knee replacement in June 2018 had PE afterwards.  really not limited by breathing and R knee doing well.  No noct cough/ wheeze/ only sob if overdoes it Off anticoagulants since Sep 21 2017 Rec Protonix 40 mg be sure to Take 30-60 min before first meal of the day  GERD  Diet    10/25/2018  f/u ov/Luis Stewart re: asbestos exp last around 1980  Chief Complaint  Patient presents with  . Follow-up    PFT's done today. Breathing is overall doing well. He is coughing less.   Dyspnea:  Not limited by breathing from desired activities  But no aerobics Cough: none Sleeping: on side/ bed flat SABA use: none 02: none  Recurrent cp 2015 neg cardiac eval  in past / started back up 2018 shortest x one hour up to a few days mostly day time  Anterior L of sternum not exertional or pleuritic?  relieved by flatus?  For cardiac CT 10/29/18 > neg for CAD/neg for asbestosis related lung/ pleural dz    10/28/2019  f/u ov/Luis Stewart re: yearly eval with pfts/ cxr s/p asbestos exp Chief Complaint  Patient presents with  . Follow-up    PFT's done. Breathing is doing well and no co's.  Dyspnea:  Not limited by breathing from desired activities   Cough: some mild pnds nothing purulent, no specific pattern s much cough better with zyrtec  Sleeping: flat fine  SABA use: none  02:  None rec No change rx   10/29/2020  f/u ov/Luis Stewart re:  Yearly surveillance for asbestos/ no symptoms Chief Complaint  Patient presents with  . Follow-up    PFT's  done today.  Breathing is overall doing well and no new co's.    Dyspnea:   Not limited by breathing from desired activities / no aerobics   Cough: minimal pnds clears quickly each am  Sleeping: flat /no resp symptoms SABA use: none 02: none    No obvious day to day or daytime variability or assoc excess/ purulent sputum or mucus plugs or hemoptysis or cp or chest tightness, subjective wheeze or overt sinus or hb symptoms.   sleeping without nocturnal  or early am exacerbation  of respiratory  c/o's or need for noct saba. Also denies any obvious fluctuation of symptoms with weather or environmental changes or other aggravating or alleviating factors except as outlined above   No unusual exposure hx or h/o childhood pna/ asthma or knowledge of premature birth.  Current Allergies, Complete Past Medical History, Past Surgical History, Family History, and Social History were reviewed in [employer] record.  ROS  The following are not active complaints unless bolded Hoarseness, sore throat, dysphagia, dental problems, itching, sneezing,  nasal congestion or discharge of excess mucus or purulent secretions, ear ache,   fever, chills, sweats, unintended wt loss or wt gain, classically pleuritic or exertional cp,  orthopnea pnd or arm/hand swelling  or leg swelling, presyncope, palpitations, abdominal pain, anorexia, nausea, vomiting, diarrhea  or change in bowel habits or change in bladder habits, change in stools or change in urine, dysuria, hematuria,  rash, arthralgias, visual complaints, headache, numbness, weakness or ataxia or problems with walking or coordination,  change in mood or  memory.        Current Meds  Medication Sig  . acetaminophen (TYLENOL) 500 MG tablet Take 1,000 mg by mouth every 6 (six) hours as needed for mild pain or moderate pain.  . Cholecalciferol (VITAMIN D) 2000 units CAPS Take 2,000 Units by mouth 3 (three) times a week.   Marland Kitchen  HYDROcodone-acetaminophen (NORCO/VICODIN) 5-325 MG tablet Take 1-2 tablets by mouth every 8 (eight) hours as needed.  . Multiple Vitamin (MULTIVITAMIN WITH MINERALS) TABS tablet Take 1 tablet by mouth daily. Centrum Silver for Men  . nitroGLYCERIN (NITROSTAT) 0.4 MG SL tablet Place 1 tablet (0.4 mg total) under the tongue every 5 (five) minutes as needed for chest pain.  . pantoprazole (PROTONIX) 40 MG tablet Take 1 tablet (40 mg total) by mouth daily before breakfast.                  Objective:   Physical Exam  Wt Readings from Last 3 Encounters:  10/29/20 187 lb 11.2 oz (85.1  kg)  04/18/20 190 lb (86.2 kg)  04/13/20 190 lb (86.2 kg)     Vital signs reviewed - Note on arrival 10/29/2020  02 sats  99% on RA     amb pleasant bm nad   HEENT : pt wearing mask not removed for exam due to covid -19 concerns.    NECK :  without JVD/Nodes/TM/ nl carotid upstrokes bilaterally   LUNGS: no acc muscle use,  Nl contour chest which is clear to A and P bilaterally without cough on insp or exp maneuvers   CV:  RRR  no s3 or murmur or increase in P2, and no edema   ABD:  soft and nontender with nl inspiratory excursion in the supine position. No bruits or organomegaly appreciated, bowel sounds nl  MS:  Nl gait/ ext warm without deformities, calf tenderness, cyanosis or clubbing No obvious joint restrictions   SKIN: warm and dry without lesions    NEURO:  alert, approp, nl sensorium with  no motor or cerebellar deficits apparent.     CXR PA and Lateral:   10/29/2020 :    I personally reviewed images and   impression as follows:   wnl         Assessment & Plan:

## 2020-10-31 ENCOUNTER — Telehealth: Payer: Self-pay

## 2020-10-31 NOTE — Telephone Encounter (Addendum)
Pt was left a vm to return callback to get results of cxr.pt was informed of cxr results verbalized understanding nothing further needed

## 2020-10-31 NOTE — Telephone Encounter (Signed)
Patient is returning phone call. Patient phone number is [phone].

## 2020-11-01 ENCOUNTER — Ambulatory Visit: Payer: Medicare HMO | Attending: Internal Medicine

## 2020-11-01 DIAGNOSIS — Z23 Encounter for immunization: Secondary | ICD-10-CM

## 2020-11-01 NOTE — Progress Notes (Signed)
° °  Covid-19 Vaccination Clinic  Name:  Callahan Wild    MRN: [id number] DOB: 01-08-1951  11/01/2020  Mr. Milledge was observed post Covid-19 immunization for 15 minutes without incident. He was provided with Vaccine Information Sheet and instruction to access the V-Safe system.   Mr. Hendricksen was instructed to call 911 with any severe reactions post vaccine:  Difficulty breathing   Swelling of face and throat   A fast heartbeat   A bad rash all over body   Dizziness and weakness

## 2020-11-14 ENCOUNTER — Other Ambulatory Visit: Payer: Self-pay | Admitting: Family Medicine

## 2020-12-06 ENCOUNTER — Other Ambulatory Visit: Payer: Self-pay | Admitting: Family Medicine

## 2020-12-19 ENCOUNTER — Encounter: Payer: Medicare HMO | Admitting: Family Medicine

## 2020-12-27 ENCOUNTER — Other Ambulatory Visit: Payer: Medicare HMO

## 2020-12-27 DIAGNOSIS — Z20822 Contact with and (suspected) exposure to covid-19: Secondary | ICD-10-CM | POA: Diagnosis not present

## 2020-12-29 LAB — SARS-COV-2, NAA 2 DAY TAT

## 2020-12-29 LAB — NOVEL CORONAVIRUS, NAA: SARS-CoV-2, NAA: NOT DETECTED

## 2021-01-02 ENCOUNTER — Encounter: Payer: Medicare HMO | Admitting: Family Medicine

## 2021-01-04 ENCOUNTER — Other Ambulatory Visit: Payer: Self-pay | Admitting: Family Medicine

## 2021-01-11 ENCOUNTER — Other Ambulatory Visit: Payer: Medicare HMO

## 2021-01-11 ENCOUNTER — Other Ambulatory Visit: Payer: Self-pay

## 2021-01-11 DIAGNOSIS — E785 Hyperlipidemia, unspecified: Secondary | ICD-10-CM

## 2021-01-11 DIAGNOSIS — Z13220 Encounter for screening for lipoid disorders: Secondary | ICD-10-CM

## 2021-01-11 DIAGNOSIS — E559 Vitamin D deficiency, unspecified: Secondary | ICD-10-CM | POA: Diagnosis not present

## 2021-01-11 DIAGNOSIS — Z136 Encounter for screening for cardiovascular disorders: Secondary | ICD-10-CM

## 2021-01-12 LAB — COMPLETE METABOLIC PANEL WITH GFR
AG Ratio: 1.1 (calc) (ref 1.0–2.5)
ALT: 16 U/L (ref 9–46)
AST: 23 U/L (ref 10–35)
Albumin: 3.8 g/dL (ref 3.6–5.1)
Alkaline phosphatase (APISO): 126 U/L (ref 35–144)
BUN/Creatinine Ratio: 11 (calc) (ref 6–22)
BUN: 15 mg/dL (ref 7–25)
CO2: 29 mmol/L (ref 20–32)
Calcium: 9.6 mg/dL (ref 8.6–10.3)
Chloride: 107 mmol/L (ref 98–110)
Creat: 1.32 mg/dL — ABNORMAL HIGH (ref 0.70–1.18)
GFR, Est African American: 63 mL/min/{1.73_m2} (ref >=60)
GFR, Est Non African American: 54 mL/min/{1.73_m2} — ABNORMAL LOW (ref >=60)
Globulin: 3.4 g/dL (calc) (ref 1.9–3.7)
Glucose, Bld: 81 mg/dL (ref 65–99)
Potassium: 4.4 mmol/L (ref 3.5–5.3)
Sodium: 142 mmol/L (ref 135–146)
Total Bilirubin: 0.8 mg/dL (ref 0.2–1.2)
Total Protein: 7.2 g/dL (ref 6.1–8.1)

## 2021-01-12 LAB — CBC WITH DIFFERENTIAL/PLATELET
Absolute Monocytes: 289 cells/uL (ref 200–950)
Basophils Absolute: 12 cells/uL (ref 0–200)
Basophils Relative: 0.3 %
Eosinophils Absolute: 129 cells/uL (ref 15–500)
Eosinophils Relative: 3.3 %
HCT: 39.6 % (ref 38.5–50.0)
Hemoglobin: 13.3 g/dL (ref 13.2–17.1)
Lymphs Abs: 1638 cells/uL (ref 850–3900)
MCH: 30.6 pg (ref 27.0–33.0)
MCHC: 33.6 g/dL (ref 32.0–36.0)
MCV: 91 fL (ref 80.0–100.0)
MPV: 11.5 fL (ref 7.5–12.5)
Monocytes Relative: 7.4 %
Neutro Abs: 1833 cells/uL (ref 1500–7800)
Neutrophils Relative %: 47 %
Platelets: 142 10*3/uL (ref 140–400)
RBC: 4.35 10*6/uL (ref 4.20–5.80)
RDW: 14.5 % (ref 11.0–15.0)
Total Lymphocyte: 42 %
WBC: 3.9 10*3/uL (ref 3.8–10.8)

## 2021-01-12 LAB — LIPID PANEL
Cholesterol: 168 mg/dL (ref <200)
HDL: 55 mg/dL (ref >=40)
LDL Cholesterol (Calc): 95 mg/dL (calc)
Non-HDL Cholesterol (Calc): 113 mg/dL (calc) (ref <130)
Total CHOL/HDL Ratio: 3.1 (calc) (ref <5.0)
Triglycerides: 85 mg/dL (ref <150)

## 2021-01-12 LAB — VITAMIN D 25 HYDROXY (VIT D DEFICIENCY, FRACTURES): Vit D, 25-Hydroxy: 50 ng/mL (ref 30–100)

## 2021-01-15 ENCOUNTER — Other Ambulatory Visit: Payer: Self-pay

## 2021-01-15 ENCOUNTER — Encounter: Payer: Self-pay | Admitting: Family Medicine

## 2021-01-15 ENCOUNTER — Ambulatory Visit (INDEPENDENT_AMBULATORY_CARE_PROVIDER_SITE_OTHER): Payer: Medicare HMO | Admitting: Family Medicine

## 2021-01-15 VITALS — BP 126/80 | HR 64 | Temp 98.1°F | Resp 14 | Ht 74.0 in | Wt 183.0 lb

## 2021-01-15 DIAGNOSIS — E785 Hyperlipidemia, unspecified: Secondary | ICD-10-CM | POA: Diagnosis not present

## 2021-01-15 DIAGNOSIS — M5410 Radiculopathy, site unspecified: Secondary | ICD-10-CM

## 2021-01-15 DIAGNOSIS — K219 Gastro-esophageal reflux disease without esophagitis: Secondary | ICD-10-CM | POA: Diagnosis not present

## 2021-01-15 DIAGNOSIS — D229 Melanocytic nevi, unspecified: Secondary | ICD-10-CM

## 2021-01-15 DIAGNOSIS — M5136 Other intervertebral disc degeneration, lumbar region: Secondary | ICD-10-CM

## 2021-01-15 DIAGNOSIS — C61 Malignant neoplasm of prostate: Secondary | ICD-10-CM

## 2021-01-15 DIAGNOSIS — Z0000 Encounter for general adult medical examination without abnormal findings: Secondary | ICD-10-CM

## 2021-01-15 DIAGNOSIS — Z0001 Encounter for general adult medical examination with abnormal findings: Secondary | ICD-10-CM | POA: Diagnosis not present

## 2021-01-15 NOTE — Progress Notes (Signed)
Subjective:   Patient presents for Medicare Annual/Subsequent preventive examination.    Pt here for wellness visit  Medications reviewed   He continues to hav epain his back and neuropathy in  feet, this is chronic but getting much worse, has pain with daily activites, pain radiates down both sides of legs Taking OTC meds He saw neurology a couple years ago, dx neuropathy but unknown cause Lumbar spine xray  showed DDD   He has a spot on his back and scalp that he cant get to heal and it keeps peeling off  GERD- he is on protonix, he would like to try coming off due to fracture last spring f his leg   Prostate Cancer- he had MRI in Sept, he follows up with urology every 6 months, no further spread, no active treatment   He has yearly exam with pulmonary , no changes on imaing          Review Past Medical/Family/Social: Per EMR   Risk Factors  Current exercise habits:  Dietary issues discussed:   Cardiac risk factors: Obesity (BMI >= 30 kg/m2).   Depression Screen  (Note: if answer to either of the following is "Yes", a more complete depression screening is indicated)  Over the past two weeks, have you felt down, depressed or hopeless? No Over the past two weeks, have you felt little interest or pleasure in doing things? No Have you lost interest or pleasure in daily life? No Do you often feel hopeless? No Do you cry easily over simple problems? No   Activities of Daily Living  In your present state of health, do you have any difficulty performing the following activities?:  Driving? No  Managing money? No  Feeding yourself? No  Getting from bed to chair? No  Climbing a flight of stairs? No  Preparing food and eating?: No  Bathing or showering? No  Getting dressed: No  Getting to the toilet? No  Using the toilet:No  Moving around from place to place: No  In the past year have you fallen or had a near fall?:Yes    Hearing Difficulties: No  Do you often ask  people to speak up or repeat themselves? No  Do you experience ringing or noises in your ears? No Do you have difficulty understanding soft or whispered voices? No  Do you feel that you have a problem with memory? No Do you often misplace items? No  Do you feel safe at home? Yes  Cognitive Testing  Alert? Yes Normal Appearance?Yes  Oriented to person? Yes Place? Yes  Time? Yes  Recall of three objects? Yes  Can perform simple calculations? Yes  Displays appropriate judgment?Yes  Can read the correct time from a watch face?Yes   List the Names of Other Physician/Practitioners you currently use:  [facility] - Nov 2ng  [facility]   Screening Tests / Date UTD Colonoscopy                     Zostavax  PNA UTD  COVID -19 Vaccine  Influenza Vaccine  Tetanus/tdap  ROS:  GEN- denies fatigue, fever, weight loss,weakness, recent illness HEENT- denies eye drainage, change in vision, nasal discharge, CVS- denies chest pain, palpitations RESP- denies SOB, cough, wheeze ABD- denies N/V, change in stools, abd pain GU- denies dysuria, hematuria, dribbling, incontinence MSK-+ joint pain, muscle aches, injury Neuro- denies headache, dizziness, syncope, seizure activity  PHYSICAL: vitals reviewed  GEN- NAD, alert and  oriented x3 HEENT- PERRL, EOMI, non injected sclera, pink conjunctiva, MMM, oropharynx clear Neck- Supple, no thryomegaly CVS- RRR, no murmur RESP-CTAB ABD-NABS,soft,NT,ND MSK- TTP lumbar spine, no spasm, decreased ROM spine, neg SLR, , mild antalgic gait  NEURO- decreased sensation bilat feet, normal tone LE, grossly in tact  Skin multiple hyperpgimented nevi, seb keratosis on back, scaley papular lesion right parietal region of  scalp EXT- No edema Pulses- Radial, DP- 2+   Assessment:    Annual wellness medicare exam   Plan:    During the course of the visit the patient was educated and counseled about appropriate screening and  preventive services including:    Immunizations UTD   Prevention UTD  Prostate cancer per urology  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  Diet review for nutrition referral? Yes ____ Not Indicated __x__  Patient Instructions (the written plan) was given to the patient.  Medicare Attestation  I have personally reviewed:  The patient's medical and social history  Their use of alcohol, tobacco or illicit drugs  Their current medications and supplements  The patient's functional ability including ADLs,fall risks, home safety risks, cognitive, and hearing and visual impairment  Diet and physical activities  Evidence for depression or mood disorders  The patient's weight, height, BMI, and visual acuity have been recorded in the chart. I have made referrals, counseling, and provided education to the patient based on review of the above and I have provided the patient with a written personalized care plan for preventive services.

## 2021-01-15 NOTE — Patient Instructions (Addendum)
MRI of spine to be done  Referral to dermatology  Try taking the pantoprazole every other day  Check on tetanus at pharmacy  F/U pending results

## 2021-01-18 ENCOUNTER — Telehealth: Payer: Self-pay | Admitting: *Deleted

## 2021-01-18 NOTE — Telephone Encounter (Signed)
Received call from patient.   Requested to have dermatology referral sent to Dr. Allyn Kenner.

## 2021-01-31 ENCOUNTER — Telehealth: Payer: Self-pay | Admitting: Family Medicine

## 2021-01-31 NOTE — Telephone Encounter (Signed)
Pt. Called asking about information for scheduling an MRI, and a referral for a dermatologist appt. He wanted to speak with the nurse about helping him get these appts scheduled. He has asked for the nurse to please call him back at  Cb#: [phone]

## 2021-01-31 NOTE — Telephone Encounter (Signed)
Message to be routed to referrals.   Please call patient to follow up.

## 2021-02-03 ENCOUNTER — Other Ambulatory Visit: Payer: Self-pay | Admitting: Family Medicine

## 2021-02-05 NOTE — Telephone Encounter (Signed)
01/16/21 Records sent to Dr Allyn Kenner - [facility] Appt - Release: [id number]

## 2021-02-07 DIAGNOSIS — J302 Other seasonal allergic rhinitis: Secondary | ICD-10-CM | POA: Diagnosis not present

## 2021-02-07 DIAGNOSIS — K219 Gastro-esophageal reflux disease without esophagitis: Secondary | ICD-10-CM | POA: Diagnosis not present

## 2021-02-07 DIAGNOSIS — I25119 Atherosclerotic heart disease of native coronary artery with unspecified angina pectoris: Secondary | ICD-10-CM | POA: Diagnosis not present

## 2021-02-07 DIAGNOSIS — Z809 Family history of malignant neoplasm, unspecified: Secondary | ICD-10-CM | POA: Diagnosis not present

## 2021-02-07 DIAGNOSIS — Z7722 Contact with and (suspected) exposure to environmental tobacco smoke (acute) (chronic): Secondary | ICD-10-CM | POA: Diagnosis not present

## 2021-02-07 DIAGNOSIS — N529 Male erectile dysfunction, unspecified: Secondary | ICD-10-CM | POA: Diagnosis not present

## 2021-02-07 DIAGNOSIS — M1990 Unspecified osteoarthritis, unspecified site: Secondary | ICD-10-CM | POA: Diagnosis not present

## 2021-02-07 DIAGNOSIS — R030 Elevated blood-pressure reading, without diagnosis of hypertension: Secondary | ICD-10-CM | POA: Diagnosis not present

## 2021-02-07 DIAGNOSIS — R32 Unspecified urinary incontinence: Secondary | ICD-10-CM | POA: Diagnosis not present

## 2021-02-07 DIAGNOSIS — G8929 Other chronic pain: Secondary | ICD-10-CM | POA: Diagnosis not present

## 2021-02-21 NOTE — Telephone Encounter (Signed)
I called patient and advised him that his insurance denied the MRI. I also gave him the numbers to Dr. Juel Burrow office. I apologized to patient since it had taken me some time to get back with him. I had given the denial information to provider.

## 2021-02-28 ENCOUNTER — Other Ambulatory Visit: Payer: Self-pay

## 2021-02-28 ENCOUNTER — Other Ambulatory Visit: Payer: Self-pay | Admitting: Family Medicine

## 2021-02-28 ENCOUNTER — Encounter: Payer: Self-pay | Admitting: Internal Medicine

## 2021-02-28 ENCOUNTER — Ambulatory Visit (INDEPENDENT_AMBULATORY_CARE_PROVIDER_SITE_OTHER): Payer: Medicare HMO | Admitting: Internal Medicine

## 2021-02-28 ENCOUNTER — Ambulatory Visit (HOSPITAL_COMMUNITY)
Admission: RE | Admit: 2021-02-28 | Discharge: 2021-02-28 | Disposition: A | Discharge status: Home / Self Care | Payer: Medicare HMO | Source: Ambulatory Visit | Attending: Internal Medicine | Admitting: Internal Medicine

## 2021-02-28 VITALS — BP 147/80 | HR 69 | Temp 98.4°F | Resp 18 | Ht 74.0 in | Wt 175.1 lb

## 2021-02-28 DIAGNOSIS — Z77090 Contact with and (suspected) exposure to asbestos: Secondary | ICD-10-CM | POA: Diagnosis not present

## 2021-02-28 DIAGNOSIS — M5416 Radiculopathy, lumbar region: Secondary | ICD-10-CM | POA: Insufficient documentation

## 2021-02-28 DIAGNOSIS — K219 Gastro-esophageal reflux disease without esophagitis: Secondary | ICD-10-CM | POA: Diagnosis not present

## 2021-02-28 DIAGNOSIS — I209 Angina pectoris, unspecified: Secondary | ICD-10-CM

## 2021-02-28 DIAGNOSIS — C61 Malignant neoplasm of prostate: Secondary | ICD-10-CM | POA: Diagnosis not present

## 2021-02-28 DIAGNOSIS — Z7689 Persons encountering health services in other specified circumstances: Secondary | ICD-10-CM | POA: Diagnosis not present

## 2021-02-28 DIAGNOSIS — M545 Low back pain, unspecified: Secondary | ICD-10-CM | POA: Diagnosis not present

## 2021-02-28 MED ORDER — NITROGLYCERIN 0.4 MG SL SUBL: 0.4000 mg | SUBLINGUAL_TABLET | SUBLINGUAL | 0 refills | Status: DC | PRN

## 2021-02-28 NOTE — Assessment & Plan Note (Signed)
Currently stable pulmonary function Follows up with Pulmonology

## 2021-02-28 NOTE — Patient Instructions (Signed)
Please continue to take medications as prescribed.  Please get X-ray of the lumbar spine done at [facility].  Please continue to follow heart healthy diet and perform moderate exercise/walking as tolerated.

## 2021-02-28 NOTE — Progress Notes (Signed)
New Patient Office Visit  Subjective:  Patient ID: Luis Stewart, male    DOB: Dec 10, 1951  Age: 70 y.o. 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  He has been having chronic low back pain, which is dull, constant, radiating to b/l legs and is associated with b/l LE numbness and tingling. He could not get MRI of the back in the past due to insurance denial.  He has been taking Pantoprazole for GERD. Currently denies any nausea, vomiting, dysphagia or odynophagia.  He has h/o asbestos exposure and pulmonary nodule according to chart review, for which he follows up with Dr Melvyn Novas.  His BP was elevated today. But he states that he has been stressed as his sister is admitted in the hospital. He denies any chest pain, dyspnea or palpitations. He was prescribed Nitroglycerin in the past for atypical chest pain, which he has not required to take.  He has h/o elevated PSA and has had prostate biopsies in the past, 1 of them have been positive for neoplasm. He follows up with Dr Beatrix Fetters. Currently denies dysuria, hematuria.  He is up-to-date with COVID vaccine.  Past Medical History:  Diagnosis Date   Arthritis    Asbestosis ([place])    Cyst of kidney, acquired    Headache    History of fracture of leg    Right, childhood   Hx of migraine headaches    Hyperlipidemia    Neuropathy    PE (pulmonary thromboembolism) ([place])    After knee surgery   Prostate cancer ([place]) 2015   Adenocarcinoma by biopsy    Past Surgical History:  Procedure  Laterality Date   BIOPSY PROSTATE  2003 and 2005   COLONOSCOPY  11/28/2008   Dr. Rourk:internal hemorrhoids/tortus colon/ascending colon polyps, tubular adenomas   COLONOSCOPY N/A 09/04/2014   Procedure: COLONOSCOPY;  Surgeon: Daneil Dolin, MD;  [facility];  Service: Endoscopy;  Laterality: N/A;  9:30   COLONOSCOPY N/A 07/17/2017   Procedure: COLONOSCOPY;  Surgeon: Danie Binder, MD;  [facility];  Service: Endoscopy;  Laterality: N/A;   ESOPHAGOGASTRODUODENOSCOPY N/A 07/16/2017   Procedure: ESOPHAGOGASTRODUODENOSCOPY (EGD);  Surgeon: Danie Binder, MD;  [facility];  Service: Endoscopy;  Laterality: N/A;   GIVENS CAPSULE STUDY  07/16/2017   Procedure: GIVENS CAPSULE STUDY;  Surgeon: Danie Binder, MD;  [facility];  Service: Endoscopy;;   JOINT REPLACEMENT N/A    Phreesia 01/12/2021   POLYPECTOMY  07/17/2017   Procedure: POLYPECTOMY;  Surgeon: Danie Binder, MD;  [facility];  Service: Endoscopy;;  cecal   TOTAL KNEE ARTHROPLASTY Right 06/10/2017   TOTAL KNEE ARTHROPLASTY Right 06/10/2017   Procedure: TOTAL KNEE ARTHROPLASTY;  Surgeon: [name], MD;  Location: [facility];  Service: Orthopedics;  Laterality: Right;    Family History  Problem Relation Age of Onset   Diabetes Mother    Hypertension Mother    Obesity Mother    Heart disease Mother  Mental illness Mother    Hypertension Sister    Obesity Sister    Arthritis Sister    Deep vein thrombosis Father    Dementia Father    Heart disease Father        CABG   Colon cancer Neg Hx     Social History   Socioeconomic History   Marital status: Married    Spouse name: Not on file   Number of children: Not on file   Years of education: Not on file   Highest education level: Not on file  Occupational History   Occupation: Retired  Tobacco Use   Smoking status: Never Smoker   Smokeless tobacco: Never Used  Brewing technologist Use: Never used  Substance and Sexual Activity   Alcohol use: No   Drug use: No   Sexual activity: Yes  Other Topics Concern   Not on file  Social History Narrative   Not on file   Social Determinants of Health   Financial Resource Strain: Not on file  Food Insecurity: Not on file  Transportation Needs: Not on file  Physical Activity: Not on file  Stress: Not on file  Social Connections: Not on file  Intimate Partner Violence: Not on file    ROS Review of Systems  Constitutional: Negative for chills and fever.  HENT: Negative for congestion and sore throat.   Eyes: Negative for pain and discharge.  Respiratory: Negative for cough and shortness of breath.   Cardiovascular: Negative for chest pain and palpitations.  Gastrointestinal: Negative for constipation, diarrhea, nausea and vomiting.  Endocrine: Negative for polydipsia and polyuria.  Genitourinary: Negative for dysuria and hematuria.  Musculoskeletal: Positive for back pain. Negative for neck pain and neck stiffness.  Skin: Negative for rash.  Neurological: Positive for numbness. Negative for dizziness, weakness and headaches.  Psychiatric/Behavioral: Negative for agitation and behavioral problems.    Objective:   Today's Vitals: BP (!) 147/80 (BP Location: Right Arm, Patient Position: Sitting, Cuff Size: Normal)    Pulse 69    Temp 98.4 F (36.9 C) (Oral)    Resp 18    Ht 6\' 2"  (1.88 m)    Wt 175 lb 1.9 oz (79.4 kg)    SpO2 98%    BMI 22.48 kg/m   Physical Exam Vitals reviewed.  Constitutional:      General: He is not in acute distress.    Appearance: He is not diaphoretic.  HENT:     Head: Normocephalic and atraumatic.     Nose: Nose normal.     Mouth/Throat:     Mouth: Mucous membranes are moist.  Eyes:     General: No scleral icterus.    Extraocular Movements: Extraocular movements intact.  Cardiovascular:     Rate and Rhythm: Normal rate and regular rhythm.     Pulses: Normal pulses.      Heart sounds: Normal heart sounds. No murmur heard.   Pulmonary:     Breath sounds: Normal breath sounds. No wheezing or rales.  Musculoskeletal:     Cervical back: Neck supple. No tenderness.     Right lower leg: No edema.     Left lower leg: No edema.  Skin:    General: Skin is warm.     Findings: No rash.  Neurological:     General: No focal deficit present.     Mental Status: He is alert and oriented to person, place, and time.     Sensory: Sensory  deficit (B/l feet) present.     Motor: No weakness.  Psychiatric:        Mood and Affect: Mood normal.        Behavior: Behavior normal.     Assessment & Plan:   Problem List Items Addressed This Visit      Encounter to establish care - Primary Care established Previous chart reviewed History and medications reviewed with the patient     Digestive   GERD (gastroesophageal reflux disease)    On Pantoprazole 40 mg QOD        Nervous and Auditory   Lumbar radiculopathy    Chronic low back pain radiating to legs with numbness and tingling Will obtain X-ray of the lumbar spine If inconclusive, will possibly need MRI of the lumbar spine      Relevant Orders   DG Lumbar Spine Complete     Genitourinary   Prostate cancer ([place])    Has had prostate biopsy Elevated PSA Follows up with Urologist        Other   Exposure to asbestos    Currently stable pulmonary function Follows up with Pulmonology          Other Visit Diagnoses    Angina pectoris ([employer])       Relevant Medications   nitroGLYCERIN (NITROSTAT) 0.4 MG SL tablet      Outpatient Encounter Medications as of 02/28/2021  Medication Sig   acetaminophen (TYLENOL) 500 MG tablet Take 1,000 mg by mouth every 6 (six) hours as needed for mild pain or moderate pain.   Cholecalciferol (VITAMIN D) 2000 units CAPS Take 2,000 Units by mouth 3 (three) times a week.    Multiple Vitamin (MULTIVITAMIN WITH MINERALS) TABS tablet Take 1 tablet by mouth daily.  Centrum Silver for Men   pantoprazole (PROTONIX) 40 MG tablet TAKE 1 TABLET BY MOUTH EVERY DAY BEFORE BREAKFAST (Patient taking differently: Take 1 tablet by mouth every other day)   [DISCONTINUED] nitroGLYCERIN (NITROSTAT) 0.4 MG SL tablet Place 1 tablet (0.4 mg total) under the tongue every 5 (five) minutes as needed for chest pain.   nitroGLYCERIN (NITROSTAT) 0.4 MG SL tablet Place 1 tablet (0.4 mg total) under the tongue every 5 (five) minutes as needed for chest pain.   No facility-administered encounter medications on file as of 02/28/2021.    Follow-up: Return in about 3 months (around 05/31/2021) for Chronic back pain, leg numbness.   Lindell Spar, MD

## 2021-02-28 NOTE — Assessment & Plan Note (Signed)
On Pantoprazole 40 mg QOD 

## 2021-02-28 NOTE — Assessment & Plan Note (Signed)
Chronic low back pain radiating to legs with numbness and tingling Will obtain X-ray of the lumbar spine If inconclusive, will possibly need MRI of the lumbar spine

## 2021-02-28 NOTE — Assessment & Plan Note (Signed)
Has had prostate biopsy Elevated PSA Follows up with Urologist

## 2021-03-06 DIAGNOSIS — L821 Other seborrheic keratosis: Secondary | ICD-10-CM | POA: Diagnosis not present

## 2021-03-06 DIAGNOSIS — L820 Inflamed seborrheic keratosis: Secondary | ICD-10-CM | POA: Diagnosis not present

## 2021-03-20 ENCOUNTER — Other Ambulatory Visit: Payer: Self-pay

## 2021-03-20 ENCOUNTER — Other Ambulatory Visit: Payer: Medicare HMO

## 2021-03-20 DIAGNOSIS — C61 Malignant neoplasm of prostate: Secondary | ICD-10-CM

## 2021-03-21 LAB — BUN+CREAT
BUN/Creatinine Ratio: 12 (ref 10–24)
BUN: 16 mg/dL (ref 8–27)
Creatinine, Ser: 1.31 mg/dL — ABNORMAL HIGH (ref 0.76–1.27)
eGFR: 59 mL/min/{1.73_m2} — ABNORMAL LOW (ref >59)

## 2021-03-21 LAB — PSA: Prostate Specific Ag, Serum: 22.4 ng/mL — ABNORMAL HIGH (ref 0.0–4.0)

## 2021-03-25 NOTE — Progress Notes (Signed)
History of Present Illness: Here for followup.    Prior history of elevated PSA--s/p TRUS/Bx in [place] in 2005 and 2008. Apparently, all cores were negative but some were not indicative of prostate tissue at all.   Because of a high PCA 3 test in this office, he underwent TRUS/Bx on 6.23.2016. 1/12 cores were positive for adenocarcinoma-10% of the core in the right lateral base revealed Gleason 3+3 equal 6 adenocarcinoma. Prostate volume was 175 cc. PSA was 16.66. PSA density 0.09. He does not have significant lower urinary tract symptomatology.   He consulted with Dr. Louis Meckel in [place] to consider robotic prostatectomy. Due to his low volume prostate cancer, and very low PSA density, it was recommended that he continue with active surveillance.   1.30.2017: Surveillance TRUS/Bx , following a prostatic MRI which revealed no suspicious prostatic lesions and correlated the patient's large prostate. At that time, all 12 cores were negative for prostate cancer. Prostatic volume was 190 mL.   8.21.2019: fusion TRUS/Bx. Recent prostate MRI--volume 220 ml. No significant lesions. No evidence transcapsular/perineural,SV/bony or lymphatic disease. Path--1 core (left apex) revealed GS 3+3 pattern in 5% of core.   PSA followup:  10.18.2016--17.16 11.14.2017--15.4 5.25.2018--17.9 4.29.2019--18.4 2.25.2020--15.9 3.30.2021--15.5 3.30.2022--22.4  9.24.2021 MRI prostate. Prostate volume 250 mL.  No significant change compared to prior exam. No radiographic evidence of high-grade prostate carcinoma. PI-RADS 2: Low (clinically significant cancer is unlikely to be present)  4.5.2022: Here for recheck. Recent PSA 22.4. IPSS 15  QoL score 3 He had one episode of gross painless hematuria several months ago that resolved.  He has had no further episodes.     Past Medical History:  Diagnosis Date  . Arthritis   . Asbestosis ([place])   . Cyst of kidney, acquired   . Headache   . History of  fracture of leg    Right, childhood  . Hx of migraine headaches   . Hyperlipidemia   . Neuropathy   . PE (pulmonary thromboembolism) ([place])    After knee surgery  . Prostate cancer ([place]) 2015   Adenocarcinoma by biopsy    Past Surgical History:  Procedure Laterality Date  . BIOPSY PROSTATE  2003 and 2005  . COLONOSCOPY  11/28/2008   Dr. Rourk:internal hemorrhoids/tortus colon/ascending colon polyps, tubular adenomas  . COLONOSCOPY N/A 09/04/2014   Procedure: COLONOSCOPY;  Surgeon: Daneil Dolin, MD;  [facility];  Service: Endoscopy;  Laterality: N/A;  9:30  . COLONOSCOPY N/A 07/17/2017   Procedure: COLONOSCOPY;  Surgeon: Danie Binder, MD;  [facility];  Service: Endoscopy;  Laterality: N/A;  . ESOPHAGOGASTRODUODENOSCOPY N/A 07/16/2017   Procedure: ESOPHAGOGASTRODUODENOSCOPY (EGD);  Surgeon: Danie Binder, MD;  [facility];  Service: Endoscopy;  Laterality: N/A;  . GIVENS CAPSULE STUDY  07/16/2017   Procedure: GIVENS CAPSULE STUDY;  Surgeon: Danie Binder, MD;  [facility];  Service: Endoscopy;;  . JOINT REPLACEMENT N/A    Phreesia 01/12/2021  . POLYPECTOMY  07/17/2017   Procedure: POLYPECTOMY;  Surgeon: Danie Binder, MD;  [facility];  Service: Endoscopy;;  cecal  . TOTAL KNEE ARTHROPLASTY Right 06/10/2017  . TOTAL KNEE ARTHROPLASTY Right 06/10/2017   Procedure: TOTAL KNEE ARTHROPLASTY;  Surgeon: [name], MD;  Location: [facility];  Service: Orthopedics;  Laterality: Right;    Home Medications:  Allergies as of 03/26/2021      Reactions   Sulfa Antibiotics Other (See Comments)   Unknown reaction. Childhood reaction  Medication List       Accurate as of March 25, 2021  6:39 PM. If you have any questions, ask your nurse or doctor.        acetaminophen 500 MG tablet Commonly known as: TYLENOL Take 1,000 mg by mouth every 6 (six) hours as needed for mild pain or moderate pain.   multivitamin with  minerals Tabs tablet Take 1 tablet by mouth daily. Centrum Silver for Men   nitroGLYCERIN 0.4 MG SL tablet Commonly known as: NITROSTAT Place 1 tablet (0.4 mg total) under the tongue every 5 (five) minutes as needed for chest pain.   pantoprazole 40 MG tablet Commonly known as: PROTONIX TAKE 1 TABLET BY MOUTH EVERY DAY BEFORE BREAKFAST What changed: See the new instructions.   Vitamin D 50 MCG (2000 UT) Caps Take 2,000 Units by mouth 3 (three) times a week.       Allergies:  Allergies  Allergen Reactions  . Sulfa Antibiotics Other (See Comments)    Unknown reaction. Childhood reaction    Family History  Problem Relation Age of Onset  . Diabetes Mother   . Hypertension Mother   . Obesity Mother   . Heart disease Mother   . Mental illness Mother   . Hypertension Sister   . Obesity Sister   . Arthritis Sister   . Deep vein thrombosis Father   . Dementia Father   . Heart disease Father        CABG  . Colon cancer Neg Hx     Social History:  reports that he has never smoked. He has never used smokeless tobacco. He reports that he does not drink alcohol and does not use drugs.  ROS: A complete review of systems was performed.  All systems are negative except for pertinent findings as noted.  Physical Exam:  Vital signs in last 24 hours: There were no vitals taken for this visit. Constitutional:  Alert and oriented, No acute distress Cardiovascular: Regular rate  Respiratory: Normal respiratory effort GI: Abdomen is soft, nontender, nondistended, no abdominal masses. No CVAT.  Genitourinary: Prostate is huge, symmetrical. Lymphatic: No lymphadenopathy Neurologic: Grossly intact, no focal deficits Psychiatric: Normal mood and affect  I have reviewed prior pt notes  I have reviewed notes from referring/previous physicians  I have reviewed urinalysis results  I have independently reviewed prior imaging--MRI results  I have reviewed prior PSA  results   Impression/Assessment:  1.  Very low risk, low volume prostate cancer, on surveillance.  PSA is rising but so is the patient's prostate size, most recently 250 mL  2.  BPH.  Moderate symptoms, not currently on any medication.  He did have 1 episode of gross painless hematuria a few months ago.  Urinalysis is clear  Plan:  I have spoken with him about adding finasteride to his medication regimen, especially with his continuous prostate growth.  Risks and benefits of the medicine were discussed as well as side effects.  He will start finasteride daily  I will see back in 6 months following PSA.

## 2021-03-26 ENCOUNTER — Ambulatory Visit (INDEPENDENT_AMBULATORY_CARE_PROVIDER_SITE_OTHER): Payer: Medicare HMO | Admitting: Urology

## 2021-03-26 ENCOUNTER — Encounter: Payer: Self-pay | Admitting: Urology

## 2021-03-26 ENCOUNTER — Ambulatory Visit: Payer: Medicare HMO | Admitting: Urology

## 2021-03-26 ENCOUNTER — Other Ambulatory Visit: Payer: Self-pay

## 2021-03-26 VITALS — BP 138/77 | HR 69 | Temp 99.3°F | Ht 69.0 in | Wt 182.0 lb

## 2021-03-26 DIAGNOSIS — N401 Enlarged prostate with lower urinary tract symptoms: Secondary | ICD-10-CM

## 2021-03-26 DIAGNOSIS — R350 Frequency of micturition: Secondary | ICD-10-CM

## 2021-03-26 DIAGNOSIS — C61 Malignant neoplasm of prostate: Secondary | ICD-10-CM | POA: Diagnosis not present

## 2021-03-26 LAB — MICROSCOPIC EXAMINATION
Epithelial Cells (non renal): NONE SEEN /hpf (ref 0–10)
RBC, Urine: NONE SEEN /hpf (ref 0–2)
Renal Epithel, UA: NONE SEEN /hpf
WBC, UA: NONE SEEN /hpf (ref 0–5)

## 2021-03-26 LAB — URINALYSIS, ROUTINE W REFLEX MICROSCOPIC
Bilirubin, UA: NEGATIVE
Glucose, UA: NEGATIVE
Ketones, UA: NEGATIVE
Leukocytes,UA: NEGATIVE
Nitrite, UA: NEGATIVE
RBC, UA: NEGATIVE
Specific Gravity, UA: 1.02 (ref 1.005–1.030)
Urobilinogen, Ur: 0.2 mg/dL (ref 0.2–1.0)
pH, UA: 5.5 (ref 5.0–7.5)

## 2021-03-26 MED ORDER — FINASTERIDE 5 MG PO TABS: 5.0000 mg | ORAL_TABLET | Freq: Every day | ORAL | 3 refills | Status: DC

## 2021-03-26 NOTE — Progress Notes (Signed)
Urological Symptom Review  Patient is experiencing the following symptoms: Frequent urination Get up at night to urinate Leakage of urine Stream starts and stops Blood in urine Painful intercourse   Review of Systems  Gastrointestinal (upper)  : Indigestion/heartburn  Gastrointestinal (lower) : Negative for lower GI symptoms  Constitutional : Negative for symptoms  Skin: Skin rash/lesion  Eyes: Negative for eye symptoms  Ear/Nose/Throat : Sinus problems  Hematologic/Lymphatic: Negative for Hematologic/Lymphatic symptoms  Cardiovascular : Negative for cardiovascular symptoms  Respiratory : Negative for respiratory symptoms  Endocrine: Negative for endocrine symptoms  Musculoskeletal: Back pain Joint pain  Neurological: Negative for neurological symptoms  Psychologic: Negative for psychiatric symptoms

## 2021-04-03 ENCOUNTER — Other Ambulatory Visit: Payer: Self-pay | Admitting: Family Medicine

## 2021-04-08 ENCOUNTER — Telehealth: Payer: Self-pay

## 2021-04-08 NOTE — Telephone Encounter (Signed)
Patient having side effects from taking Finasteride 5mg   Bowels move during urination Frequent urination  Please advise. Call back:  [phone] Luis Stewart)   Thanks, Luis Stewart

## 2021-04-09 ENCOUNTER — Telehealth: Payer: Self-pay

## 2021-04-09 ENCOUNTER — Other Ambulatory Visit: Payer: Medicare HMO

## 2021-04-09 ENCOUNTER — Other Ambulatory Visit: Payer: Self-pay

## 2021-04-09 DIAGNOSIS — R300 Dysuria: Secondary | ICD-10-CM

## 2021-04-09 DIAGNOSIS — N401 Enlarged prostate with lower urinary tract symptoms: Secondary | ICD-10-CM

## 2021-04-09 NOTE — Telephone Encounter (Signed)
See other telephone encounter.

## 2021-04-10 ENCOUNTER — Other Ambulatory Visit: Payer: Self-pay | Admitting: Family Medicine

## 2021-04-10 LAB — URINALYSIS, ROUTINE W REFLEX MICROSCOPIC
Bilirubin, UA: NEGATIVE
Glucose, UA: NEGATIVE
Ketones, UA: NEGATIVE
Leukocytes,UA: NEGATIVE
Nitrite, UA: NEGATIVE
Specific Gravity, UA: 1.015 (ref 1.005–1.030)
Urobilinogen, Ur: 0.2 mg/dL (ref 0.2–1.0)
pH, UA: 6 (ref 5.0–7.5)

## 2021-04-11 NOTE — Telephone Encounter (Signed)
Message sent to Dr. Diona Fanti to look at UA results and advise please.

## 2021-04-12 ENCOUNTER — Other Ambulatory Visit: Payer: Self-pay

## 2021-04-12 ENCOUNTER — Other Ambulatory Visit: Payer: Medicare HMO

## 2021-04-12 DIAGNOSIS — N401 Enlarged prostate with lower urinary tract symptoms: Secondary | ICD-10-CM

## 2021-04-12 DIAGNOSIS — R350 Frequency of micturition: Secondary | ICD-10-CM | POA: Diagnosis not present

## 2021-04-12 NOTE — Telephone Encounter (Signed)
Patient still have persistent burning with urination. Urine culture ordered and messaged sent to MD.

## 2021-04-14 LAB — URINE CULTURE: Organism ID, Bacteria: NO GROWTH

## 2021-04-16 NOTE — Progress Notes (Signed)
Result sent via mychart.   
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
## 2021-05-28 ENCOUNTER — Ambulatory Visit: Payer: Medicare HMO | Admitting: Family Medicine

## 2021-05-28 ENCOUNTER — Encounter: Payer: Self-pay | Admitting: Family Medicine

## 2021-05-28 VITALS — BP 128/76 | HR 63 | Ht 74.0 in | Wt 179.0 lb

## 2021-05-28 DIAGNOSIS — Z77090 Contact with and (suspected) exposure to asbestos: Secondary | ICD-10-CM

## 2021-05-28 DIAGNOSIS — K219 Gastro-esophageal reflux disease without esophagitis: Secondary | ICD-10-CM | POA: Diagnosis not present

## 2021-05-28 DIAGNOSIS — R0789 Other chest pain: Secondary | ICD-10-CM

## 2021-05-28 DIAGNOSIS — E782 Mixed hyperlipidemia: Secondary | ICD-10-CM

## 2021-05-28 NOTE — Patient Instructions (Signed)

## 2021-05-30 ENCOUNTER — Ambulatory Visit: Payer: Medicare HMO | Admitting: Internal Medicine

## 2021-06-06 ENCOUNTER — Encounter: Payer: Self-pay | Admitting: Internal Medicine

## 2021-06-06 ENCOUNTER — Ambulatory Visit (INDEPENDENT_AMBULATORY_CARE_PROVIDER_SITE_OTHER): Payer: Medicare HMO | Admitting: Internal Medicine

## 2021-06-06 ENCOUNTER — Other Ambulatory Visit: Payer: Self-pay

## 2021-06-06 VITALS — BP 131/79 | HR 64 | Temp 98.1°F | Resp 18 | Ht 74.0 in | Wt 178.8 lb

## 2021-06-06 DIAGNOSIS — K219 Gastro-esophageal reflux disease without esophagitis: Secondary | ICD-10-CM | POA: Diagnosis not present

## 2021-06-06 DIAGNOSIS — C61 Malignant neoplasm of prostate: Secondary | ICD-10-CM

## 2021-06-06 DIAGNOSIS — M5416 Radiculopathy, lumbar region: Secondary | ICD-10-CM | POA: Diagnosis not present

## 2021-06-06 NOTE — Progress Notes (Signed)
Established Patient Office Visit  Subjective:  Patient ID: Luis Stewart, male    DOB: 1951-01-03  Age: 70 y.o. MRN: [id number]  CC:  Chief Complaint  Patient presents with   Follow-up    3 month follow up pt is still having pain in his back and legs     HPI Luis Stewart is a 70 year old male with PMH of GERD, chronic low back pain, elevated PSA, asbestosis and atypical chest pain who  presents for follow up of his chronic medical conditions.  He has been having chronic low back pain, which is dull, constant, radiating to b/l legs and is associated with b/l LE numbness and tingling. Denies saddle anesthesia, stool or urinary incontinence. He takes Tylenol as needed for pain. He has h/o elevated PSA and prostate cancer. He is concerned as his sister had back for long time and was found to have malignant lesions there later on.  He has been taking Pantoprazole for GERD. Currently denies any nausea, vomiting, dysphagia or odynophagia.  Past Medical History:  Diagnosis Date   Arthritis    Asbestosis ([place])    Cyst of kidney, acquired    Headache    History of fracture of leg    Right, childhood   Hx of migraine headaches    Hyperlipidemia    Neuropathy    PE (pulmonary thromboembolism) ([place])    After knee surgery   Prostate cancer ([place]) 2015   Adenocarcinoma by biopsy    Past Surgical History:  Procedure Laterality Date   BIOPSY PROSTATE  2003 and 2005   COLONOSCOPY  11/28/2008   Dr. Rourk:internal hemorrhoids/tortus colon/ascending colon polyps, tubular adenomas   COLONOSCOPY N/A 09/04/2014   Procedure: COLONOSCOPY;  Surgeon: Daneil Dolin, MD;  [facility];  Service: Endoscopy;  Laterality: N/A;  9:30   COLONOSCOPY N/A 07/17/2017   Procedure: COLONOSCOPY;  Surgeon: Danie Binder, MD;  [facility];  Service: Endoscopy;  Laterality: N/A;   ESOPHAGOGASTRODUODENOSCOPY N/A 07/16/2017   Procedure: ESOPHAGOGASTRODUODENOSCOPY  (EGD);  Surgeon: Danie Binder, MD;  [facility];  Service: Endoscopy;  Laterality: N/A;   GIVENS CAPSULE STUDY  07/16/2017   Procedure: GIVENS CAPSULE STUDY;  Surgeon: Danie Binder, MD;  [facility];  Service: Endoscopy;;   JOINT REPLACEMENT N/A    Phreesia 01/12/2021   POLYPECTOMY  07/17/2017   Procedure: POLYPECTOMY;  Surgeon: Danie Binder, MD;  [facility];  Service: Endoscopy;;  cecal   TOTAL KNEE ARTHROPLASTY Right 06/10/2017   TOTAL KNEE ARTHROPLASTY Right 06/10/2017   Procedure: TOTAL KNEE ARTHROPLASTY;  Surgeon: [name], MD;  Location: [facility];  Service: Orthopedics;  Laterality: Right;    Family History  Problem Relation Age of Onset   Diabetes Mother    Hypertension Mother    Obesity Mother    Heart disease Mother    Mental illness Mother    Hypertension Sister    Obesity Sister    Arthritis Sister    Deep vein thrombosis Father    Dementia Father    Heart disease Father        CABG   Colon cancer Neg Hx     Social History   Socioeconomic History   Marital status: Married    Spouse name: Not on file   Number of children: Not on file   Years of education: Not on file   Highest education level: Not on file  Occupational  History   Occupation: Retired  Tobacco Use   Smoking status: Never   Smokeless tobacco: Never  Vaping Use   Vaping Use: Never used  Substance and Sexual Activity   Alcohol use: No   Drug use: No   Sexual activity: Yes  Other Topics Concern   Not on file  Social History Narrative   Not on file   Social Determinants of Health   Financial Resource Strain: Not on file  Food Insecurity: Not on file  Transportation Needs: Not on file  Physical Activity: Not on file  Stress: Not on file  Social Connections: Not on file  Intimate Partner Violence: Not on file    Outpatient Medications Prior to Visit  Medication Sig Dispense Refill   acetaminophen (TYLENOL) 500 MG tablet Take 1,000 mg by  mouth every 6 (six) hours as needed for mild pain or moderate pain.     Cholecalciferol (VITAMIN D) 2000 units CAPS Take 2,000 Units by mouth 3 (three) times a week.      Multiple Vitamin (MULTIVITAMIN WITH MINERALS) TABS tablet Take 1 tablet by mouth daily. Centrum Silver for Men     nitroGLYCERIN (NITROSTAT) 0.4 MG SL tablet Place 1 tablet (0.4 mg total) under the tongue every 5 (five) minutes as needed for chest pain. 30 tablet 0   pantoprazole (PROTONIX) 40 MG tablet Take 40 mg by mouth every other day.     No facility-administered medications prior to visit.    Allergies  Allergen Reactions   Sulfa Antibiotics Other (See Comments)    Unknown reaction. Childhood reaction    ROS Review of Systems  Constitutional:  Negative for chills and fever.  HENT:  Negative for congestion and sore throat.   Eyes:  Negative for pain and discharge.  Respiratory:  Negative for cough and shortness of breath.   Cardiovascular:  Negative for chest pain and palpitations.  Gastrointestinal:  Negative for constipation, diarrhea, nausea and vomiting.  Endocrine: Negative for polydipsia and polyuria.  Genitourinary:  Negative for dysuria and hematuria.  Musculoskeletal:  Positive for back pain. Negative for neck pain and neck stiffness.  Skin:  Negative for rash.  Neurological:  Positive for numbness. Negative for dizziness, weakness and headaches.  Psychiatric/Behavioral:  Negative for agitation and behavioral problems.      Objective:    Physical Exam Vitals reviewed.  Constitutional:      General: He is not in acute distress.    Appearance: He is not diaphoretic.  HENT:     Head: Normocephalic and atraumatic.     Nose: Nose normal.     Mouth/Throat:     Mouth: Mucous membranes are moist.  Eyes:     General: No scleral icterus.    Extraocular Movements: Extraocular movements intact.  Cardiovascular:     Rate and Rhythm: Normal rate and regular rhythm.     Pulses: Normal pulses.      Heart sounds: Normal heart sounds. No murmur heard. Pulmonary:     Breath sounds: Normal breath sounds. No wheezing or rales.  Musculoskeletal:     Cervical back: Neck supple. No tenderness.     Right lower leg: No edema.     Left lower leg: No edema.  Skin:    General: Skin is warm.     Findings: No rash.  Neurological:     General: No focal deficit present.     Mental Status: He is alert and oriented to person, place, and time.  Sensory: Sensory deficit (B/l feet) present.     Motor: No weakness.  Psychiatric:        Mood and Affect: Mood normal.        Behavior: Behavior normal.    BP 131/79 (BP Location: Left Arm, Patient Position: Sitting, Cuff Size: Normal)   Pulse 64   Temp 98.1 F (36.7 C) (Oral)   Resp 18   Ht _0  (1.88 m)   Wt 178 lb 12.8 oz (81.1 kg)   SpO2 97%   BMI 22.96 kg/m  Wt Readings from Last 3 Encounters:  06/06/21 178 lb 12.8 oz (81.1 kg)  05/28/21 179 lb (81.2 kg)  03/26/21 182 lb (82.6 kg)     Health Maintenance Due  Topic Date Due   Zoster Vaccines- Shingrix (1 of 2) Never done   COVID-19 Vaccine (4 - Booster for Moderna series) 02/01/2021   TETANUS/TDAP  02/24/2021    There are no preventive care reminders to display for this patient.  Lab Results  Component Value Date   TSH 4.672 (H) 06/01/2014   Lab Results  Component Value Date   WBC 3.9 01/11/2021   HGB 13.3 01/11/2021   HCT 39.6 01/11/2021   MCV 91.0 01/11/2021   PLT 142 01/11/2021   Lab Results  Component Value Date   NA 142 01/11/2021   K 4.4 01/11/2021   CO2 29 01/11/2021   GLUCOSE 81 01/11/2021   BUN 16 03/20/2021   CREATININE 1.31 (H) 03/20/2021   BILITOT 0.8 01/11/2021   ALKPHOS 121 08/14/2018   AST 23 01/11/2021   ALT 16 01/11/2021   PROT 7.2 01/11/2021   ALBUMIN 3.9 08/14/2018   CALCIUM 9.6 01/11/2021   ANIONGAP 5 10/28/2018   EGFR 59 (L) 03/20/2021   Lab Results  Component Value Date   CHOL 168 01/11/2021   Lab Results  Component Value Date    HDL 55 01/11/2021   Lab Results  Component Value Date   LDLCALC 95 01/11/2021   Lab Results  Component Value Date   TRIG 85 01/11/2021   Lab Results  Component Value Date   CHOLHDL 3.1 01/11/2021   No results found for: HGBA1C    Assessment & Plan:   Problem List Items Addressed This Visit       Digestive   GERD (gastroesophageal reflux disease)    On Pantoprazole 40 mg QOD         Nervous and Auditory   Lumbar radiculopathy - Primary    Chronic low back pain radiating to legs with numbness and tingling, referred to PT X-ray of the lumbar spine showed degenerative chnages Check MRI of the lumbar spine as persistent symptoms and has underlying prostate ca.       Relevant Orders   MR Lumbar Spine Wo Contrast   Ambulatory referral to Physical Therapy     Genitourinary   Prostate cancer [facility])    Has had prostate biopsy Elevated PSA Follows up with Urologist - under PSA surveillance Did not tolerate Finasteride        No orders of the defined types were placed in this encounter.   Follow-up: Return in about 4 months (around 10/06/2021) for Chronic low back pain.    Lindell Spar, MD

## 2021-06-06 NOTE — Patient Instructions (Signed)
You are being referred to physical therapy for chronic low back pain.  We are trying to schedule MRI of lumbar spine.  Okay to take Tylenol arthritis up to 3 times in a day for back pain.

## 2021-06-06 NOTE — Assessment & Plan Note (Signed)
On Pantoprazole 40 mg QOD 

## 2021-06-06 NOTE — Assessment & Plan Note (Signed)
Has had prostate biopsy Elevated PSA Follows up with Urologist - under PSA surveillance Did not tolerate Finasteride

## 2021-06-06 NOTE — Assessment & Plan Note (Addendum)
Chronic low back pain radiating to legs with numbness and tingling, referred to PT X-ray of the lumbar spine showed degenerative chnages Check MRI of the lumbar spine as persistent symptoms and has underlying prostate ca.

## 2021-06-14 ENCOUNTER — Telehealth: Payer: Self-pay

## 2021-06-14 NOTE — Telephone Encounter (Signed)
The precertification request for the MRI lumbar w/o contrast was denied by his insurance.

## 2021-06-26 ENCOUNTER — Other Ambulatory Visit: Payer: Self-pay

## 2021-06-26 ENCOUNTER — Encounter (HOSPITAL_COMMUNITY): Payer: Self-pay | Admitting: Physical Therapy

## 2021-06-26 ENCOUNTER — Ambulatory Visit (HOSPITAL_COMMUNITY): Payer: Medicare HMO | Attending: Internal Medicine | Admitting: Physical Therapy

## 2021-06-26 DIAGNOSIS — R262 Difficulty in walking, not elsewhere classified: Secondary | ICD-10-CM | POA: Diagnosis not present

## 2021-06-26 DIAGNOSIS — M5441 Lumbago with sciatica, right side: Secondary | ICD-10-CM | POA: Insufficient documentation

## 2021-06-26 DIAGNOSIS — M5442 Lumbago with sciatica, left side: Secondary | ICD-10-CM | POA: Diagnosis not present

## 2021-06-26 DIAGNOSIS — M6281 Muscle weakness (generalized): Secondary | ICD-10-CM | POA: Diagnosis not present

## 2021-06-26 DIAGNOSIS — G8929 Other chronic pain: Secondary | ICD-10-CM | POA: Diagnosis not present

## 2021-06-26 NOTE — Therapy (Signed)
[place], [place], [address] Phone: [phone]   Fax:  [phone]  Physical Therapy Evaluation  Patient Details  Name: Luis Stewart MRN: [id number] Date of Birth: 1951/06/20 Referring Provider (PT): Lindell Spar   Encounter Date: 06/26/2021   PT End of Session - 06/26/21 0832     Visit Number 1    Number of Visits 8    Date for PT Re-Evaluation 08/21/21    Authorization Type [employer] no auth of VL    Progress Note Due on Visit 10    PT Start Time 0835    PT Stop Time 0912    PT Time Calculation (min) 37 min    Activity Tolerance Patient tolerated treatment well    Behavior During Therapy [facility] for tasks assessed/performed             Past Medical History:  Diagnosis Date   Arthritis    Asbestosis ([place])    Cyst of kidney, acquired    Headache    History of fracture of leg    Right, childhood   Hx of migraine headaches    Hyperlipidemia    Neuropathy    PE (pulmonary thromboembolism) ([place])    After knee surgery   Prostate cancer ([place]) 2015   Adenocarcinoma by biopsy    Past Surgical History:  Procedure Laterality Date   BIOPSY PROSTATE  2003 and 2005   COLONOSCOPY  11/28/2008   Dr. Rourk:internal hemorrhoids/tortus colon/ascending colon polyps, tubular adenomas   COLONOSCOPY N/A 09/04/2014   Procedure: COLONOSCOPY;  Surgeon: Daneil Dolin, MD;  [facility];  Service: Endoscopy;  Laterality: N/A;  9:30   COLONOSCOPY N/A 07/17/2017   Procedure: COLONOSCOPY;  Surgeon: Danie Binder, MD;  [facility];  Service: Endoscopy;  Laterality: N/A;   ESOPHAGOGASTRODUODENOSCOPY N/A 07/16/2017   Procedure: ESOPHAGOGASTRODUODENOSCOPY (EGD);  Surgeon: Danie Binder, MD;  [facility];  Service: Endoscopy;  Laterality: N/A;   GIVENS CAPSULE STUDY  07/16/2017   Procedure: GIVENS CAPSULE STUDY;  Surgeon: Danie Binder, MD;  [facility];   Service: Endoscopy;;   JOINT REPLACEMENT N/A    Phreesia 01/12/2021   POLYPECTOMY  07/17/2017   Procedure: POLYPECTOMY;  Surgeon: Danie Binder, MD;  [facility];  Service: Endoscopy;;  cecal   TOTAL KNEE ARTHROPLASTY Right 06/10/2017   TOTAL KNEE ARTHROPLASTY Right 06/10/2017   Procedure: TOTAL KNEE ARTHROPLASTY;  Surgeon: [name], MD;  Location: [place];  Service: Orthopedics;  Laterality: Right;    There were no vitals filed for this visit.    Subjective Assessment - 06/26/21 0842     Subjective States that he has been having some back pain for a bit now. States that he has neuropathy in legs and feet for 20 some odd years but there is no known finding on why that has been going on. States that he has been having the back pain for about a year. Reports no significant injury that could of caused it and reports that his pain has progressively worsened over the last year. States he was having numbness on the sides of the legs and down into the feet. States since his back has started to hurt he has been having pain down the back of his legs. States that he feels the pain more when he is walking on different surfaces (like concrete) he feels the pain more. States  that sitting in a chair feels better then sitting in the car. Walking typically aggravates his symptoms.    Pertinent History bilateral lower leg symptoms, history prostrate cancer    Limitations Standing;Walking    Diagnostic tests xray no MRI yet    Patient Stated Goals to stop having pain.    Currently in Pain? Yes    Pain Score 2     Pain Location Back    Pain Orientation Mid;Right;Left   L>R currently but switches   Pain Descriptors / Indicators Aching    Pain Radiating Towards down back s of both legs (thighs not past knees)                [facility] PT Assessment - 06/26/21 0001       Assessment   Medical Diagnosis LBP    Referring Provider (PT) Lindell Spar    Next MD Visit 10/10/21   3 month f/u    Prior Therapy yes for TKA on R in 2018      Balance Screen   Has the patient fallen in the past 6 months No      Prior Function   Level of Independence Independent      Cognition   Overall Cognitive Status Within Functional Limits for tasks assessed      Observation/Other Assessments   Focus on Therapeutic Outcomes (FOTO)  63.39% function      ROM / Strength   AROM / PROM / Strength AROM;Strength      AROM   Overall AROM Comments hip ROM WNL bilaterally    AROM Assessment Site Lumbar    Lumbar Flexion 50% limited   stretch - no change in symptoms   Lumbar Extension 75% limited   reduced leg symptoms   Lumbar - Right Side Bend 50% limited   no change in symptoms   Lumbar - Left Side Bend 75% limited   reduced symptoms in legs and back     Strength   Strength Assessment Site Hip;Knee;Ankle    Right/Left Hip Left;Right    Right Hip Flexion 4+/5    Right Hip Extension 4-/5   slight discomfort on right low back   Right Hip ABduction 4/5    Left Hip Flexion 4/5    Left Hip Extension 4-/5    Left Hip ABduction 4+/5    Right/Left Knee Right;Left    Right Knee Flexion 4-/5    Right Knee Extension 4+/5    Left Knee Flexion 3+/5    Left Knee Extension 4/5    Right/Left Ankle Right;Left    Right Ankle Dorsiflexion 4+/5    Left Ankle Dorsiflexion 4+/5      Special Tests    Special Tests Lumbar    Lumbar Tests other;Slump Test;Prone Knee Bend Test;Straight Leg Raise      Slump test   Findings Negative    Comment bilaterally negative      Prone Knee Bend Test   Findings Positive    Comment bilaterally - hip hike      Straight Leg Raise   Findings Negative    Comment bilaterally      other   Findings --   repeated standing extension x15 5" holds   Comments improved symptosm in back but minimal change in legs.                        Objective measurements completed on examination: See above findings.  [place] Adult PT Treatment/Exercise -  06/26/21 0001       Exercises   Exercises Lumbar      Lumbar Exercises: Stretches   Other Lumbar Stretch Exercise repeated lumbar extension x15 standing      Lumbar Exercises: Prone   Other Prone Lumbar Exercises knee bends x10 5" holds bilalterally                    PT Education - 06/26/21 0914     Education Details on current condition, on HEP, on POC and FOTO score    Person(s) Educated Patient    Methods Explanation    Comprehension Verbalized understanding              PT Short Term Goals - 06/26/21 0906       PT SHORT TERM GOAL #1   Title Patient will be independent in self management strategies to improve quality of life and functional outcomes.    Time 3    Period Weeks    Status New    Target Date 07/17/21      PT SHORT TERM GOAL #2   Title Patient will report at least 50% improvement in overall symptoms and/or function to demonstrate improved functional mobility    Time 3    Period Weeks    Status New    Target Date 07/17/21      PT SHORT TERM GOAL #3   Title Patient will demonstrate painfree lumbar ROM    Time 3    Period Weeks    Status New    Target Date 07/17/21               PT Long Term Goals - 06/26/21 0907       PT LONG TERM GOAL #1   Title PAtietn will be able to report improved tolerance to walking on different surfaces (concrete).    Time 8    Period Weeks    Status New    Target Date 08/21/21      PT LONG TERM GOAL #2   Title Patient will improve on FOTO score to meet predicted outcomes to demonstrate improved functional mobility.    Time 8    Period Weeks    Status New    Target Date 08/21/21      PT LONG TERM GOAL #3   Title Patient will report at least 75% improvement in overall symptoms and/or function to demonstrate improved functional mobility    Time 8    Period Weeks    Status New    Target Date 08/21/21                    Plan - 06/26/21 0914     Clinical Impression Statement  Patient is a 70 y.o. male who presents to physical therapy with complaint of chronic low back pain. Patient demonstrates decreased strength, ROM restriction and gait abnormalities which are likely contributing to symptoms of pain and are negatively impacting patient ability to perform ADLs and functional mobility tasks. Patient will benefit from skilled physical therapy services to address these deficits to reduce pain, improve level of function with ADLs, functional mobility tasks, and reduce risk for falls.    Personal Factors and Comorbidities Comorbidity 1;Comorbidity 2    Comorbidities prostate cancer, TKA R    Examination-Activity Limitations Stairs;Stand;Locomotion Level;Lift;Sit    Examination-Participation Restrictions Community Activity;Meal Prep;Yard Work;Driving    Stability/Clinical Decision Making Evolving/Moderate complexity  Clinical Decision Making Moderate    Rehab Potential Good    PT Frequency 1x / week    PT Duration 8 weeks    PT Treatment/Interventions ADLs/Self Care Home Management;Aquatic Therapy;Cryotherapy;Electrical Stimulation;Traction;Moist Heat;Balance training;Therapeutic exercise;Therapeutic activities;Stair training;Gait training;Neuromuscular re-education;Patient/family education;Manual techniques;Dry needling;Passive range of motion;Joint Manipulations    PT Next Visit Plan f/u with lumbar extension, posterior chain strengthening (hamstrings/glutes), quad/hipflexor lengthening, general lumbar mobility    PT Home Exercise Plan hamstring culrs prone, lumbar extension    Consulted and Agree with Plan of Care Patient             Patient will benefit from skilled therapeutic intervention in order to improve the following deficits and impairments:  Postural dysfunction, Hypomobility, Decreased activity tolerance, Difficulty walking, Decreased range of motion, Pain, Decreased strength  Visit Diagnosis: Difficulty in walking, not elsewhere classified  Chronic  midline low back pain with bilateral sciatica  Muscle weakness (generalized)     Problem List Patient Active Problem List   Diagnosis Date Noted   Lumbar radiculopathy 02/28/2021   Closed fracture of distal end of fibula with tibia with routine healing 05/28/2020   Closed fracture of left distal fibula 04/18/2020   ED (erectile dysfunction) 05/11/2019   History of DVT (deep vein thrombosis) 04/16/2018   GERD (gastroesophageal reflux disease) 10/15/2017   Pulmonary nodule 07/19/2017   Constipation 07/15/2017   History of pulmonary embolism 06/18/2017   Primary localized osteoarthritis of right knee 06/10/2017   Prostate cancer ([place]) 08/17/2015   Exposure to asbestos 08/17/2015   Personal history of colonic polyps 08/08/2014   Vitamin D deficiency 06/05/2014   BPH (benign prostatic hypertrophy) 06/01/2013   Mild hyperlipidemia 06/01/2013   Routine general medical examination at a health care facility 06/01/2013   9:17 AM, 06/26/21 Jerene Pitch, DPT Physical Therapy with [facility]  [phone] office   [place] [address] [place], [place], [address] Phone: [phone]   Fax:  [phone]  Name: Luis Stewart MRN: [id number] Date of Birth: February 13, 1951

## 2021-07-03 ENCOUNTER — Encounter (HOSPITAL_COMMUNITY): Payer: Self-pay | Admitting: Physical Therapy

## 2021-07-03 ENCOUNTER — Other Ambulatory Visit: Payer: Self-pay

## 2021-07-03 ENCOUNTER — Ambulatory Visit (HOSPITAL_COMMUNITY): Payer: Medicare HMO | Admitting: Physical Therapy

## 2021-07-03 DIAGNOSIS — M5441 Lumbago with sciatica, right side: Secondary | ICD-10-CM | POA: Diagnosis not present

## 2021-07-03 DIAGNOSIS — M5442 Lumbago with sciatica, left side: Secondary | ICD-10-CM | POA: Diagnosis not present

## 2021-07-03 DIAGNOSIS — M6281 Muscle weakness (generalized): Secondary | ICD-10-CM

## 2021-07-03 DIAGNOSIS — G8929 Other chronic pain: Secondary | ICD-10-CM | POA: Diagnosis not present

## 2021-07-03 DIAGNOSIS — R262 Difficulty in walking, not elsewhere classified: Secondary | ICD-10-CM | POA: Diagnosis not present

## 2021-07-03 NOTE — Therapy (Signed)
[place], [place], [address] Phone: [phone]   Fax:  [phone]  Physical Therapy Treatment  Patient Details  Name: Luis Stewart MRN: [id number] Date of Birth: [birth year] Referring Provider (PT): Lindell Spar   Encounter Date: 07/03/2021   PT End of Session - 07/03/21 0839     Visit Number 2    Number of Visits 8    Date for PT Re-Evaluation 08/21/21    Authorization Type [employer] no auth of VL    Progress Note Due on Visit 10    PT Start Time 0831    PT Stop Time 0910    PT Time Calculation (min) 39 min    Activity Tolerance Patient tolerated treatment well    Behavior During Therapy [facility] for tasks assessed/performed             Past Medical History:  Diagnosis Date   Arthritis    Asbestosis ([place])    Cyst of kidney, acquired    Headache    History of fracture of leg    Right, childhood   Hx of migraine headaches    Hyperlipidemia    Neuropathy    PE (pulmonary thromboembolism) ([place])    After knee surgery   Prostate cancer ([place]) 2015   Adenocarcinoma by biopsy    Past Surgical History:  Procedure Laterality Date   BIOPSY PROSTATE  2003 and 2005   COLONOSCOPY  11/28/2008   Dr. Rourk:internal hemorrhoids/tortus colon/ascending colon polyps, tubular adenomas   COLONOSCOPY N/A 09/04/2014   Procedure: COLONOSCOPY;  Surgeon: Daneil Dolin, MD;  [facility];  Service: Endoscopy;  Laterality: N/A;  9:30   COLONOSCOPY N/A 07/17/2017   Procedure: COLONOSCOPY;  Surgeon: Danie Binder, MD;  [facility];  Service: Endoscopy;  Laterality: N/A;   ESOPHAGOGASTRODUODENOSCOPY N/A 07/16/2017   Procedure: ESOPHAGOGASTRODUODENOSCOPY (EGD);  Surgeon: Danie Binder, MD;  [facility];  Service: Endoscopy;  Laterality: N/A;   GIVENS CAPSULE STUDY  07/16/2017   Procedure: GIVENS CAPSULE STUDY;  Surgeon: Danie Binder, MD;  [facility];   Service: Endoscopy;;   JOINT REPLACEMENT N/A    Phreesia 01/12/2021   POLYPECTOMY  07/17/2017   Procedure: POLYPECTOMY;  Surgeon: Danie Binder, MD;  [facility];  Service: Endoscopy;;  cecal   TOTAL KNEE ARTHROPLASTY Right 06/10/2017   TOTAL KNEE ARTHROPLASTY Right 06/10/2017   Procedure: TOTAL KNEE ARTHROPLASTY;  Surgeon: [name], MD;  Location: [place];  Service: Orthopedics;  Laterality: Right;    There were no vitals filed for this visit.   Subjective Assessment - 07/03/21 0834     Subjective States that he has 2/10 pain in his back and little bit in the legs.    Pertinent History bilateral lower leg symptoms, history prostrate cancer    Limitations Standing;Walking    Diagnostic tests xray no MRI yet    Patient Stated Goals to stop having pain.    Currently in Pain? Yes    Pain Location Back    Pain Descriptors / Indicators Aching;Dull;Sore                [facility] PT Assessment - 07/03/21 0001       Assessment   Medical Diagnosis LBP    Referring Provider (PT) Gregor Hams Keith Rake  [place] Adult PT Treatment/Exercise - 07/03/21 0001       Lumbar Exercises: Stretches   Double Knee to Chest Stretch --   2 minutes with green ball x2   Lower Trunk Rotation --   2 minutes with green ball     Lumbar Exercises: Supine   Bridge 5 reps;2 seconds   6 sets   Other Supine Lumbar Exercises hamstring isometrics x10 5" holds      Lumbar Exercises: Prone   Other Prone Lumbar Exercises heel squeeze x15 5" holds    Other Prone Lumbar Exercises knee bends 2x10 5" holds bilalterally      Manual Therapy   Manual Therapy Manual Traction    Manual therapy comments all manual interventions performed independently of other interventions    Manual Traction lumbar traction - continuous holds with green ball                    PT Education - 07/03/21 0903     Education Details on traction, anatomy, muscle cramping,  rationale for HEP    Person(s) Educated Patient    Methods Explanation    Comprehension Verbalized understanding              PT Short Term Goals - 06/26/21 0906       PT SHORT TERM GOAL #1   Title Patient will be independent in self management strategies to improve quality of life and functional outcomes.    Time 3    Period Weeks    Status New    Target Date 07/17/21      PT SHORT TERM GOAL #2   Title Patient will report at least 50% improvement in overall symptoms and/or function to demonstrate improved functional mobility    Time 3    Period Weeks    Status New    Target Date 07/17/21      PT SHORT TERM GOAL #3   Title Patient will demonstrate painfree lumbar ROM    Time 3    Period Weeks    Status New    Target Date 07/17/21               PT Long Term Goals - 06/26/21 0907       PT LONG TERM GOAL #1   Title PAtietn will be able to report improved tolerance to walking on different surfaces (concrete).    Time 8    Period Weeks    Status New    Target Date 08/21/21      PT LONG TERM GOAL #2   Title Patient will improve on FOTO score to meet predicted outcomes to demonstrate improved functional mobility.    Time 8    Period Weeks    Status New    Target Date 08/21/21      PT LONG TERM GOAL #3   Title Patient will report at least 75% improvement in overall symptoms and/or function to demonstrate improved functional mobility    Time 8    Period Weeks    Status New    Target Date 08/21/21                   Plan - 07/03/21 0839     Clinical Impression Statement Focused on lumbar mobility and posterior chain strengthening on this date. Tolerated exercises well moderately well. Slight increase in pain with hamstring isometric and bridge. Transitioned back to lower trunk rotations and pain returned to baseline. Performed traction  and good stretch noted with slight reduction in symptoms. Cramping in hamstring initially with prone knee curl, but  this resolved with repetition. Added new exercises to HEP, will continue with current POC as indicated.    Personal Factors and Comorbidities Comorbidity 1;Comorbidity 2    Comorbidities prostate cancer, TKA R    Examination-Activity Limitations Stairs;Stand;Locomotion Level;Lift;Sit    Examination-Participation Restrictions Community Activity;Meal Prep;Yard Work;Driving    Stability/Clinical Decision Making Evolving/Moderate complexity    Rehab Potential Good    PT Frequency 1x / week    PT Duration 8 weeks    PT Treatment/Interventions ADLs/Self Care Home Management;Aquatic Therapy;Cryotherapy;Electrical Stimulation;Traction;Moist Heat;Balance training;Therapeutic exercise;Therapeutic activities;Stair training;Gait training;Neuromuscular re-education;Patient/family education;Manual techniques;Dry needling;Passive range of motion;Joint Manipulations    PT Next Visit Plan f/u with lumbar extension, posterior chain strengthening (hamstrings/glutes), quad/hipflexor lengthening, general lumbar mobility    PT Home Exercise Plan hamstring culrs prone, lumbar extension; 7/13 LTR, bridge    Consulted and Agree with Plan of Care Patient             Patient will benefit from skilled therapeutic intervention in order to improve the following deficits and impairments:  Postural dysfunction, Hypomobility, Decreased activity tolerance, Difficulty walking, Decreased range of motion, Pain, Decreased strength  Visit Diagnosis: Difficulty in walking, not elsewhere classified  Chronic midline low back pain with bilateral sciatica  Muscle weakness (generalized)     Problem List Patient Active Problem List   Diagnosis Date Noted   Lumbar radiculopathy 02/28/2021   Closed fracture of distal end of fibula with tibia with routine healing 05/28/2020   Closed fracture of left distal fibula 04/18/2020   ED (erectile dysfunction) 05/11/2019   History of DVT (deep vein thrombosis) 04/16/2018   GERD  (gastroesophageal reflux disease) 10/15/2017   Pulmonary nodule 07/19/2017   Constipation 07/15/2017   History of pulmonary embolism 06/18/2017   Primary localized osteoarthritis of right knee 06/10/2017   Prostate cancer ([place]) 08/17/2015   Exposure to asbestos 08/17/2015   Personal history of colonic polyps 08/08/2014   Vitamin D deficiency 06/05/2014   BPH (benign prostatic hypertrophy) 06/01/2013   Mild hyperlipidemia 06/01/2013   Routine general medical examination at a health care facility 06/01/2013   9:11 AM, 07/03/21 Jerene Pitch, DPT Physical Therapy with [facility]  [phone] office   [place], [place], [address] Phone: [phone]   Fax:  [phone]  Name: Makaveli Hoard MRN: [id number] Date of Birth: Mar 11, 1951

## 2021-07-10 ENCOUNTER — Encounter (HOSPITAL_COMMUNITY): Payer: Self-pay | Admitting: Physical Therapy

## 2021-07-10 ENCOUNTER — Other Ambulatory Visit: Payer: Self-pay

## 2021-07-10 ENCOUNTER — Ambulatory Visit (HOSPITAL_COMMUNITY): Payer: Medicare HMO | Admitting: Physical Therapy

## 2021-07-10 DIAGNOSIS — R262 Difficulty in walking, not elsewhere classified: Secondary | ICD-10-CM

## 2021-07-10 DIAGNOSIS — M6281 Muscle weakness (generalized): Secondary | ICD-10-CM | POA: Diagnosis not present

## 2021-07-10 DIAGNOSIS — M5441 Lumbago with sciatica, right side: Secondary | ICD-10-CM | POA: Diagnosis not present

## 2021-07-10 DIAGNOSIS — G8929 Other chronic pain: Secondary | ICD-10-CM

## 2021-07-10 DIAGNOSIS — M5442 Lumbago with sciatica, left side: Secondary | ICD-10-CM | POA: Diagnosis not present

## 2021-07-10 NOTE — Therapy (Signed)
[place], [place], [address] Phone: [phone]   Fax:  [phone]  Physical Therapy Treatment  Patient Details  Name: Luis Stewart MRN: [id number] Date of Birth: [birth year] Referring Provider (PT): Lindell Spar   Encounter Date: 07/10/2021   PT End of Session - 07/10/21 0907     Visit Number 3    Number of Visits 8    Date for PT Re-Evaluation 08/21/21    Authorization Type [employer] no auth of VL    Progress Note Due on Visit 10    PT Start Time 0902    PT Stop Time 0940    PT Time Calculation (min) 38 min    Activity Tolerance Patient tolerated treatment well    Behavior During Therapy [facility] for tasks assessed/performed             Past Medical History:  Diagnosis Date   Arthritis    Asbestosis ([place])    Cyst of kidney, acquired    Headache    History of fracture of leg    Right, childhood   Hx of migraine headaches    Hyperlipidemia    Neuropathy    PE (pulmonary thromboembolism) ([place])    After knee surgery   Prostate cancer ([place]) 2015   Adenocarcinoma by biopsy    Past Surgical History:  Procedure Laterality Date   BIOPSY PROSTATE  2003 and 2005   COLONOSCOPY  11/28/2008   Dr. Rourk:internal hemorrhoids/tortus colon/ascending colon polyps, tubular adenomas   COLONOSCOPY N/A 09/04/2014   Procedure: COLONOSCOPY;  Surgeon: Daneil Dolin, MD;  [facility];  Service: Endoscopy;  Laterality: N/A;  9:30   COLONOSCOPY N/A 07/17/2017   Procedure: COLONOSCOPY;  Surgeon: Danie Binder, MD;  [facility];  Service: Endoscopy;  Laterality: N/A;   ESOPHAGOGASTRODUODENOSCOPY N/A 07/16/2017   Procedure: ESOPHAGOGASTRODUODENOSCOPY (EGD);  Surgeon: Danie Binder, MD;  [facility];  Service: Endoscopy;  Laterality: N/A;   GIVENS CAPSULE STUDY  07/16/2017   Procedure: GIVENS CAPSULE STUDY;  Surgeon: Danie Binder, MD;  [facility];   Service: Endoscopy;;   JOINT REPLACEMENT N/A    Phreesia 01/12/2021   POLYPECTOMY  07/17/2017   Procedure: POLYPECTOMY;  Surgeon: Danie Binder, MD;  [facility];  Service: Endoscopy;;  cecal   TOTAL KNEE ARTHROPLASTY Right 06/10/2017   TOTAL KNEE ARTHROPLASTY Right 06/10/2017   Procedure: TOTAL KNEE ARTHROPLASTY;  Surgeon: [name], MD;  Location: [place];  Service: Orthopedics;  Laterality: Right;    There were no vitals filed for this visit.   Subjective Assessment - 07/10/21 0906     Subjective Patient reports back was sore earlier today, not sure what causes it to hurt. Feeling somewhat better now. "Probably about a 3".    Pertinent History bilateral lower leg symptoms, history prostrate cancer    Limitations Standing;Walking    Diagnostic tests xray no MRI yet    Patient Stated Goals to stop having pain.    Currently in Pain? Yes    Pain Score 3     Pain Location Back    Pain Orientation Mid;Posterior    Pain Descriptors / Indicators Aching                               [facility] Adult PT Treatment/Exercise - 07/10/21 0001  Lumbar Exercises: Stretches   Double Knee to Chest Stretch 5 reps;10 seconds    Lower Trunk Rotation 5 reps;10 seconds      Lumbar Exercises: Supine   Ab Set 10 reps;5 seconds    Bent Knee Raise 20 reps    Bridge 10 reps;3 seconds    Straight Leg Raise 10 reps    Other Supine Lumbar Exercises hamstring isometrics x10 5" holds      Lumbar Exercises: Prone   Other Prone Lumbar Exercises heel squeeze x10 5" holds                      PT Short Term Goals - 06/26/21 0906       PT SHORT TERM GOAL #1   Title Patient will be independent in self management strategies to improve quality of life and functional outcomes.    Time 3    Period Weeks    Status New    Target Date 07/17/21      PT SHORT TERM GOAL #2   Title Patient will report at least 50% improvement in overall symptoms and/or  function to demonstrate improved functional mobility    Time 3    Period Weeks    Status New    Target Date 07/17/21      PT SHORT TERM GOAL #3   Title Patient will demonstrate painfree lumbar ROM    Time 3    Period Weeks    Status New    Target Date 07/17/21               PT Long Term Goals - 06/26/21 0907       PT LONG TERM GOAL #1   Title PAtietn will be able to report improved tolerance to walking on different surfaces (concrete).    Time 8    Period Weeks    Status New    Target Date 08/21/21      PT LONG TERM GOAL #2   Title Patient will improve on FOTO score to meet predicted outcomes to demonstrate improved functional mobility.    Time 8    Period Weeks    Status New    Target Date 08/21/21      PT LONG TERM GOAL #3   Title Patient will report at least 75% improvement in overall symptoms and/or function to demonstrate improved functional mobility    Time 8    Period Weeks    Status New    Target Date 08/21/21                   Plan - 07/10/21 0934     Clinical Impression Statement Patient tolerated session well today with no complaint of increased pain. Added core stabilization and strengthening exercise. Patient educated on proper form and function of all added exercises. Issued HEP handout. Patient noting decreased back pain EOS. Patient will continue to benefit from skilled therapy services to reduce deficits and improve functional ability.    Personal Factors and Comorbidities Comorbidity 1;Comorbidity 2    Comorbidities prostate cancer, TKA R    Examination-Activity Limitations Stairs;Stand;Locomotion Level;Lift;Sit    Examination-Participation Restrictions Community Activity;Meal Prep;Yard Work;Driving    Stability/Clinical Decision Making Evolving/Moderate complexity    Rehab Potential Good    PT Frequency 1x / week    PT Duration 8 weeks    PT Treatment/Interventions ADLs/Self Care Home Management;Aquatic  Therapy;Cryotherapy;Electrical Stimulation;Traction;Moist Heat;Balance training;Therapeutic exercise;Therapeutic activities;Stair training;Gait training;Neuromuscular re-education;Patient/family education;Manual techniques;Dry needling;Passive range  of motion;Joint Manipulations    PT Next Visit Plan posterior chain strengthening (hamstrings/glutes), quad/hipflexor lengthening, general lumbar mobility. Give ab march, ab set HEP ( [place] not responding prior visit)    PT Home Exercise Plan hamstring culrs prone, lumbar extension; 7/13 LTR, bridge 7/20 ab set, bent knee march    Consulted and Agree with Plan of Care Patient             Patient will benefit from skilled therapeutic intervention in order to improve the following deficits and impairments:  Postural dysfunction, Hypomobility, Decreased activity tolerance, Difficulty walking, Decreased range of motion, Pain, Decreased strength  Visit Diagnosis: Difficulty in walking, not elsewhere classified  Chronic midline low back pain with bilateral sciatica  Muscle weakness (generalized)     Problem List Patient Active Problem List   Diagnosis Date Noted   Lumbar radiculopathy 02/28/2021   Closed fracture of distal end of fibula with tibia with routine healing 05/28/2020   Closed fracture of left distal fibula 04/18/2020   ED (erectile dysfunction) 05/11/2019   History of DVT (deep vein thrombosis) 04/16/2018   GERD (gastroesophageal reflux disease) 10/15/2017   Pulmonary nodule 07/19/2017   Constipation 07/15/2017   History of pulmonary embolism 06/18/2017   Primary localized osteoarthritis of right knee 06/10/2017   Prostate cancer ([place]) 08/17/2015   Exposure to asbestos 08/17/2015   Personal history of colonic polyps 08/08/2014   Vitamin D deficiency 06/05/2014   BPH (benign prostatic hypertrophy) 06/01/2013   Mild hyperlipidemia 06/01/2013   Routine general medical examination at a health care facility 06/01/2013    9:43 AM, 07/10/21 Josue Hector PT DPT  Physical Therapist with [facility]  [phone] [place] [address] [place], [place], [address] Phone: [phone]   Fax:  [phone]  Name: Luis Stewart MRN: [id number] Date of Birth: July 06, 1951

## 2021-07-17 ENCOUNTER — Encounter (HOSPITAL_COMMUNITY): Payer: Self-pay | Admitting: Physical Therapy

## 2021-07-17 ENCOUNTER — Ambulatory Visit (HOSPITAL_COMMUNITY): Payer: Medicare HMO | Admitting: Physical Therapy

## 2021-07-17 ENCOUNTER — Other Ambulatory Visit: Payer: Self-pay

## 2021-07-17 DIAGNOSIS — G8929 Other chronic pain: Secondary | ICD-10-CM

## 2021-07-17 DIAGNOSIS — Z20822 Contact with and (suspected) exposure to covid-19: Secondary | ICD-10-CM | POA: Diagnosis not present

## 2021-07-17 DIAGNOSIS — M5441 Lumbago with sciatica, right side: Secondary | ICD-10-CM | POA: Diagnosis not present

## 2021-07-17 DIAGNOSIS — M5442 Lumbago with sciatica, left side: Secondary | ICD-10-CM | POA: Diagnosis not present

## 2021-07-17 DIAGNOSIS — M6281 Muscle weakness (generalized): Secondary | ICD-10-CM | POA: Diagnosis not present

## 2021-07-17 DIAGNOSIS — R262 Difficulty in walking, not elsewhere classified: Secondary | ICD-10-CM

## 2021-07-17 NOTE — Therapy (Signed)
[place], [place], [address] Phone: [phone]   Fax:  [phone]  Physical Therapy Treatment  Patient Details  Name: Luis Stewart MRN: [id number] Date of Birth: Mar 13, 1951 Referring Provider (PT): Lindell Spar   Encounter Date: 07/17/2021   PT End of Session - 07/17/21 0912     Visit Number 4    Number of Visits 8    Date for PT Re-Evaluation 08/21/21    Authorization Type [employer] H[employer] no auth of VL    Progress Note Due on Visit 10    PT Start Time 0907    PT Stop Time 0946    PT Time Calculation (min) 39 min    Activity Tolerance Patient tolerated treatment well    Behavior During Therapy [facility] for tasks assessed/performed             Past Medical History:  Diagnosis Date   Arthritis    Asbestosis ([place])    Cyst of kidney, acquired    Headache    History of fracture of leg    Right, childhood   Hx of migraine headaches    Hyperlipidemia    Neuropathy    PE (pulmonary thromboembolism) ([place])    After knee surgery   Prostate cancer ([place]) 2015   Adenocarcinoma by biopsy    Past Surgical History:  Procedure Laterality Date   BIOPSY PROSTATE  2003 and 2005   COLONOSCOPY  11/28/2008   Dr. Rourk:internal hemorrhoids/tortus colon/ascending colon polyps, tubular adenomas   COLONOSCOPY N/A 09/04/2014   Procedure: COLONOSCOPY;  Surgeon: Daneil Dolin, MD;  [facility];  Service: Endoscopy;  Laterality: N/A;  9:30   COLONOSCOPY N/A 07/17/2017   Procedure: COLONOSCOPY;  Surgeon: Danie Binder, MD;  [facility];  Service: Endoscopy;  Laterality: N/A;   ESOPHAGOGASTRODUODENOSCOPY N/A 07/16/2017   Procedure: ESOPHAGOGASTRODUODENOSCOPY (EGD);  Surgeon: Danie Binder, MD;  [facility];  Service: Endoscopy;  Laterality: N/A;   GIVENS CAPSULE STUDY  07/16/2017   Procedure: GIVENS CAPSULE STUDY;  Surgeon: Danie Binder, MD;  [facility];   Service: Endoscopy;;   JOINT REPLACEMENT N/A    Phreesia 01/12/2021   POLYPECTOMY  07/17/2017   Procedure: POLYPECTOMY;  Surgeon: Danie Binder, MD;  [facility];  Service: Endoscopy;;  cecal   TOTAL KNEE ARTHROPLASTY Right 06/10/2017   TOTAL KNEE ARTHROPLASTY Right 06/10/2017   Procedure: TOTAL KNEE ARTHROPLASTY;  Surgeon: [name], MD;  Location: [place];  Service: Orthopedics;  Laterality: Right;    There were no vitals filed for this visit.   Subjective Assessment - 07/17/21 0911     Subjective Back hurting this AM, feeling better now. Better once he gets up. Worked last night.    Pertinent History bilateral lower leg symptoms, history prostrate cancer    Limitations Standing;Walking    Diagnostic tests xray no MRI yet    Patient Stated Goals to stop having pain.    Currently in Pain? Yes    Pain Score 2     Pain Location Back    Pain Orientation Lower;Posterior    Pain Descriptors / Indicators Aching                               [facility] Adult PT Treatment/Exercise - 07/17/21 0001       Lumbar Exercises: Stretches  Double Knee to Chest Stretch 5 reps;10 seconds    Lower Trunk Rotation 5 reps;10 seconds      Lumbar Exercises: Supine   Ab Set 10 reps;5 seconds    Bent Knee Raise 20 reps    Bridge 10 reps;3 seconds    Straight Leg Raise 10 reps    Other Supine Lumbar Exercises scapular retraction 10x 5"    Other Supine Lumbar Exercises hamstring isometrics x10 5" holds      Lumbar Exercises: Prone   Straight Leg Raise 10 reps    Other Prone Lumbar Exercises heel squeeze x10 5" holds                      PT Short Term Goals - 06/26/21 0906       PT SHORT TERM GOAL #1   Title Patient will be independent in self management strategies to improve quality of life and functional outcomes.    Time 3    Period Weeks    Status New    Target Date 07/17/21      PT SHORT TERM GOAL #2   Title Patient will report at least  50% improvement in overall symptoms and/or function to demonstrate improved functional mobility    Time 3    Period Weeks    Status New    Target Date 07/17/21      PT SHORT TERM GOAL #3   Title Patient will demonstrate painfree lumbar ROM    Time 3    Period Weeks    Status New    Target Date 07/17/21               PT Long Term Goals - 06/26/21 0907       PT LONG TERM GOAL #1   Title PAtietn will be able to report improved tolerance to walking on different surfaces (concrete).    Time 8    Period Weeks    Status New    Target Date 08/21/21      PT LONG TERM GOAL #2   Title Patient will improve on FOTO score to meet predicted outcomes to demonstrate improved functional mobility.    Time 8    Period Weeks    Status New    Target Date 08/21/21      PT LONG TERM GOAL #3   Title Patient will report at least 75% improvement in overall symptoms and/or function to demonstrate improved functional mobility    Time 8    Period Weeks    Status New    Target Date 08/21/21                   Plan - 07/17/21 0940     Clinical Impression Statement Patient tolerated session well with no increased complaint of pain. Progressed postural and glute strengthening with added prone hip extension and supine scap retractions. Patient educated on proper form and function of each added exercise. Patient will continue to benefit from skilled therapy services to reduce deficits and improve functional ability.    Personal Factors and Comorbidities Comorbidity 1;Comorbidity 2    Comorbidities prostate cancer, TKA R    Examination-Activity Limitations Stairs;Stand;Locomotion Level;Lift;Sit    Examination-Participation Restrictions Community Activity;Meal Prep;Yard Work;Driving    Stability/Clinical Decision Making Evolving/Moderate complexity    Rehab Potential Good    PT Frequency 1x / week    PT Duration 8 weeks    PT Treatment/Interventions ADLs/Self Care Home Management;Aquatic  Therapy;Cryotherapy;Dealer  Stimulation;Traction;Moist Heat;Balance training;Therapeutic exercise;Therapeutic activities;Stair training;Gait training;Neuromuscular re-education;Patient/family education;Manual techniques;Dry needling;Passive range of motion;Joint Manipulations    PT Next Visit Plan posterior chain strengthening (hamstrings/glutes), quad/hipflexor lengthening, general lumbar mobility. Sit to stand, palloff press    PT Home Exercise Plan hamstring culrs prone, lumbar extension; 7/13 LTR, bridge 7/20 ab set, bent knee march 7/27 prone hip extension    Consulted and Agree with Plan of Care Patient             Patient will benefit from skilled therapeutic intervention in order to improve the following deficits and impairments:  Postural dysfunction, Hypomobility, Decreased activity tolerance, Difficulty walking, Decreased range of motion, Pain, Decreased strength  Visit Diagnosis: Difficulty in walking, not elsewhere classified  Chronic midline low back pain with bilateral sciatica  Muscle weakness (generalized)     Problem List Patient Active Problem List   Diagnosis Date Noted   Lumbar radiculopathy 02/28/2021   Closed fracture of distal end of fibula with tibia with routine healing 05/28/2020   Closed fracture of left distal fibula 04/18/2020   ED (erectile dysfunction) 05/11/2019   History of DVT (deep vein thrombosis) 04/16/2018   GERD (gastroesophageal reflux disease) 10/15/2017   Pulmonary nodule 07/19/2017   Constipation 07/15/2017   History of pulmonary embolism 06/18/2017   Primary localized osteoarthritis of right knee 06/10/2017   Prostate cancer ([place]) 08/17/2015   Exposure to asbestos 08/17/2015   Personal history of colonic polyps 08/08/2014   Vitamin D deficiency 06/05/2014   BPH (benign prostatic hypertrophy) 06/01/2013   Mild hyperlipidemia 06/01/2013   Routine general medical examination at a health care facility 06/01/2013   9:46 AM,  07/17/21 Josue Hector PT DPT  Physical Therapist with [facility]  [phone] [place] [address] [place], [place], [address] Phone: [phone]   Fax:  [phone]  Name: Luis Stewart MRN: [id number] Date of Birth: 1951/10/21

## 2021-07-24 ENCOUNTER — Encounter (HOSPITAL_COMMUNITY): Payer: Self-pay | Admitting: Physical Therapy

## 2021-07-24 ENCOUNTER — Other Ambulatory Visit: Payer: Self-pay

## 2021-07-24 ENCOUNTER — Ambulatory Visit (HOSPITAL_COMMUNITY): Payer: Medicare HMO | Attending: Internal Medicine | Admitting: Physical Therapy

## 2021-07-24 DIAGNOSIS — G8929 Other chronic pain: Secondary | ICD-10-CM | POA: Diagnosis not present

## 2021-07-24 DIAGNOSIS — M6281 Muscle weakness (generalized): Secondary | ICD-10-CM

## 2021-07-24 DIAGNOSIS — R262 Difficulty in walking, not elsewhere classified: Secondary | ICD-10-CM | POA: Insufficient documentation

## 2021-07-24 DIAGNOSIS — M5442 Lumbago with sciatica, left side: Secondary | ICD-10-CM | POA: Insufficient documentation

## 2021-07-24 DIAGNOSIS — M5441 Lumbago with sciatica, right side: Secondary | ICD-10-CM | POA: Insufficient documentation

## 2021-07-24 NOTE — Therapy (Signed)
[place] [address] [place], [place], [address] Phone: [phone]   Fax:  [phone]  Physical Therapy Treatment  Patient Details  Name: Luis Stewart MRN: [id number] Date of Birth: [birth year] Referring Provider (PT): Lindell Spar   Encounter Date: 07/24/2021   PT End of Session - 07/24/21 0831     Visit Number 5    Number of Visits 8    Date for PT Re-Evaluation 08/21/21    Authorization Type [employer] no auth of VL    Progress Note Due on Visit 10    PT Start Time 0831    PT Stop Time 0911    PT Time Calculation (min) 40 min    Activity Tolerance Patient tolerated treatment well    Behavior During Therapy [facility] for tasks assessed/performed             Past Medical History:  Diagnosis Date   Arthritis    Asbestosis ([place])    Cyst of kidney, acquired    Headache    History of fracture of leg    Right, childhood   Hx of migraine headaches    Hyperlipidemia    Neuropathy    PE (pulmonary thromboembolism) ([place])    After knee surgery   Prostate cancer ([place]) 2015   Adenocarcinoma by biopsy    Past Surgical History:  Procedure Laterality Date   BIOPSY PROSTATE  2003 and 2005   COLONOSCOPY  11/28/2008   Dr. Rourk:internal hemorrhoids/tortus colon/ascending colon polyps, tubular adenomas   COLONOSCOPY N/A 09/04/2014   Procedure: COLONOSCOPY;  Surgeon: Daneil Dolin, MD;  [facility];  Service: Endoscopy;  Laterality: N/A;  9:30   COLONOSCOPY N/A 07/17/2017   Procedure: COLONOSCOPY;  Surgeon: Danie Binder, MD;  [facility];  Service: Endoscopy;  Laterality: N/A;   ESOPHAGOGASTRODUODENOSCOPY N/A 07/16/2017   Procedure: ESOPHAGOGASTRODUODENOSCOPY (EGD);  Surgeon: Danie Binder, MD;  [facility];  Service: Endoscopy;  Laterality: N/A;   GIVENS CAPSULE STUDY  07/16/2017   Procedure: GIVENS CAPSULE STUDY;  Surgeon: Danie Binder, MD;  [facility];   Service: Endoscopy;;   JOINT REPLACEMENT N/A    Phreesia 01/12/2021   POLYPECTOMY  07/17/2017   Procedure: POLYPECTOMY;  Surgeon: Danie Binder, MD;  [facility];  Service: Endoscopy;;  cecal   TOTAL KNEE ARTHROPLASTY Right 06/10/2017   TOTAL KNEE ARTHROPLASTY Right 06/10/2017   Procedure: TOTAL KNEE ARTHROPLASTY;  Surgeon: [name], MD;  Location: [place];  Service: Orthopedics;  Laterality: Right;    There were no vitals filed for this visit.   Subjective Assessment - 07/24/21 0832     Subjective Patient states sometimes back is doing well and sometimes not. Not sure what increases symptoms it just comes and goes. Exercises are going well. Seem helpful.    Pertinent History bilateral lower leg symptoms, history prostrate cancer    Limitations Standing;Walking    Diagnostic tests xray no MRI yet    Patient Stated Goals to stop having pain.    Currently in Pain? Yes    Pain Score 2     Pain Location Back    Pain Orientation Lower    Pain Descriptors / Indicators Aching    Pain Type Chronic pain  [facility] Adult PT Treatment/Exercise - 07/24/21 0001       Lumbar Exercises: Stretches   Active Hamstring Stretch 5 reps;10 seconds;Right;Left    Other Lumbar Stretch Exercise seated trunk flexion 10 x 5 second holds      Lumbar Exercises: Standing   Row Both;10 reps    Theraband Level (Row) Level 3 (Green)    Shoulder Extension Both;10 reps    Theraband Level (Shoulder Extension) Level 3 (Green)    Other Standing Lumbar Exercises palof green band 1x 10 bilateral      Lumbar Exercises: Seated   Sit to Stand 10 reps    Sit to Stand Limitations 2 sets      Lumbar Exercises: Supine   Bridge with clamshell 10 reps;3 seconds    Bridge with [employer] Limitations green band                    PT Education - 07/24/21 0831     Education Details HEP    Person(s) Educated Patient    Methods Explanation     Comprehension Verbalized understanding              PT Short Term Goals - 06/26/21 0906       PT SHORT TERM GOAL #1   Title Patient will be independent in self management strategies to improve quality of life and functional outcomes.    Time 3    Period Weeks    Status New    Target Date 07/17/21      PT SHORT TERM GOAL #2   Title Patient will report at least 50% improvement in overall symptoms and/or function to demonstrate improved functional mobility    Time 3    Period Weeks    Status New    Target Date 07/17/21      PT SHORT TERM GOAL #3   Title Patient will demonstrate painfree lumbar ROM    Time 3    Period Weeks    Status New    Target Date 07/17/21               PT Long Term Goals - 06/26/21 0907       PT LONG TERM GOAL #1   Title PAtietn will be able to report improved tolerance to walking on different surfaces (concrete).    Time 8    Period Weeks    Status New    Target Date 08/21/21      PT LONG TERM GOAL #2   Title Patient will improve on FOTO score to meet predicted outcomes to demonstrate improved functional mobility.    Time 8    Period Weeks    Status New    Target Date 08/21/21      PT LONG TERM GOAL #3   Title Patient will report at least 75% improvement in overall symptoms and/or function to demonstrate improved functional mobility    Time 8    Period Weeks    Status New    Target Date 08/21/21                   Plan - 07/24/21 0831     Clinical Impression Statement Patient stating possible relief from flexion based exercises when experiencing symptoms at home. Patient also with occasional hamstring symptoms. Began active hamstring stretch which patient tolerates well without increase in symptoms but targets concordant area. Continued with core/hip strength and added band to bridges for increased glute activation. Began  postural strengthening which patient tolerates well without increase in symptoms. Patient will  continue to benefit from skilled physical therapy in order to reduce impairment and improve function.    Personal Factors and Comorbidities Comorbidity 1;Comorbidity 2    Comorbidities prostate cancer, TKA R    Examination-Activity Limitations Stairs;Stand;Locomotion Level;Lift;Sit    Examination-Participation Restrictions Community Activity;Meal Prep;Yard Work;Driving    Stability/Clinical Decision Making Evolving/Moderate complexity    Rehab Potential Good    PT Frequency 1x / week    PT Duration 8 weeks    PT Treatment/Interventions ADLs/Self Care Home Management;Aquatic Therapy;Cryotherapy;Electrical Stimulation;Traction;Moist Heat;Balance training;Therapeutic exercise;Therapeutic activities;Stair training;Gait training;Neuromuscular re-education;Patient/family education;Manual techniques;Dry needling;Passive range of motion;Joint Manipulations    PT Next Visit Plan posterior chain strengthening (hamstrings/glutes), quad/hipflexor lengthening, general lumbar mobility.    PT Home Exercise Plan hamstring culrs prone, lumbar extension; 7/13 LTR, bridge 7/20 ab set, bent knee march 7/27 prone hip extension 8/3 active hamstring strech, bridge with band, seated lumbar flexion stretch    Consulted and Agree with Plan of Care Patient             Patient will benefit from skilled therapeutic intervention in order to improve the following deficits and impairments:  Postural dysfunction, Hypomobility, Decreased activity tolerance, Difficulty walking, Decreased range of motion, Pain, Decreased strength  Visit Diagnosis: Difficulty in walking, not elsewhere classified  Chronic midline low back pain with bilateral sciatica  Muscle weakness (generalized)     Problem List Patient Active Problem List   Diagnosis Date Noted   Lumbar radiculopathy 02/28/2021   Closed fracture of distal end of fibula with tibia with routine healing 05/28/2020   Closed fracture of left distal fibula 04/18/2020    ED (erectile dysfunction) 05/11/2019   History of DVT (deep vein thrombosis) 04/16/2018   GERD (gastroesophageal reflux disease) 10/15/2017   Pulmonary nodule 07/19/2017   Constipation 07/15/2017   History of pulmonary embolism 06/18/2017   Primary localized osteoarthritis of right knee 06/10/2017   Prostate cancer ([place]) 08/17/2015   Exposure to asbestos 08/17/2015   Personal history of colonic polyps 08/08/2014   Vitamin D deficiency 06/05/2014   BPH (benign prostatic hypertrophy) 06/01/2013   Mild hyperlipidemia 06/01/2013   Routine general medical examination at a health care facility 06/01/2013    9:09 AM, 07/24/21 Mearl Latin PT, DPT Physical Therapist at [facility] [address] [place], [place], [address] Phone: [phone]   Fax:  [phone]  Name: Luis Stewart MRN: [id number] Date of Birth: 1951/05/21

## 2021-07-31 ENCOUNTER — Ambulatory Visit (HOSPITAL_COMMUNITY): Payer: Medicare HMO | Admitting: Physical Therapy

## 2021-07-31 ENCOUNTER — Encounter (HOSPITAL_COMMUNITY): Payer: Self-pay | Admitting: Physical Therapy

## 2021-07-31 ENCOUNTER — Other Ambulatory Visit: Payer: Self-pay

## 2021-07-31 DIAGNOSIS — M5442 Lumbago with sciatica, left side: Secondary | ICD-10-CM | POA: Diagnosis not present

## 2021-07-31 DIAGNOSIS — G8929 Other chronic pain: Secondary | ICD-10-CM

## 2021-07-31 DIAGNOSIS — R262 Difficulty in walking, not elsewhere classified: Secondary | ICD-10-CM

## 2021-07-31 DIAGNOSIS — M6281 Muscle weakness (generalized): Secondary | ICD-10-CM

## 2021-07-31 DIAGNOSIS — M5441 Lumbago with sciatica, right side: Secondary | ICD-10-CM | POA: Diagnosis not present

## 2021-07-31 NOTE — Therapy (Signed)
[place], [place], [address] Phone: [phone]   Fax:  [phone]  Physical Therapy Treatment  Patient Details  Name: Luis Stewart MRN: [id number] Date of Birth: 1951-04-01 Referring Provider (PT): Luis Stewart   Encounter Date: 07/31/2021   PT End of Session - 07/31/21 0914     Visit Number 6    Number of Visits 8    Date for PT Re-Evaluation 08/21/21    Authorization Type [employer] no auth of VL    Progress Note Due on Visit 10    PT Start Time 0908    PT Stop Time 0946    PT Time Calculation (min) 38 min    Activity Tolerance Patient tolerated treatment well    Behavior During Therapy [name] for tasks assessed/performed             Past Medical History:  Diagnosis Date   Arthritis    Asbestosis ([place])    Cyst of kidney, acquired    Headache    History of fracture of leg    Right, childhood   Hx of migraine headaches    Hyperlipidemia    Neuropathy    PE (pulmonary thromboembolism) ([place])    After knee surgery   Prostate cancer ([place]) 2015   Adenocarcinoma by biopsy    Past Surgical History:  Procedure Laterality Date   BIOPSY PROSTATE  2003 and 2005   COLONOSCOPY  11/28/2008   Dr. Rourk:internal hemorrhoids/tortus colon/ascending colon polyps, tubular adenomas   COLONOSCOPY N/A 09/04/2014   Procedure: COLONOSCOPY;  Surgeon: Daneil Dolin, MD;  [facility];  Service: Endoscopy;  Laterality: N/A;  9:30   COLONOSCOPY N/A 07/17/2017   Procedure: COLONOSCOPY;  Surgeon: Danie Binder, MD;  [facility];  Service: Endoscopy;  Laterality: N/A;   ESOPHAGOGASTRODUODENOSCOPY N/A 07/16/2017   Procedure: ESOPHAGOGASTRODUODENOSCOPY (EGD);  Surgeon: Danie Binder, MD;  [facility];  Service: Endoscopy;  Laterality: N/A;   GIVENS CAPSULE STUDY  07/16/2017   Procedure: GIVENS CAPSULE STUDY;  Surgeon: Danie Binder, MD;  [facility];   Service: Endoscopy;;   JOINT REPLACEMENT N/A    Phreesia 01/12/2021   POLYPECTOMY  07/17/2017   Procedure: POLYPECTOMY;  Surgeon: Danie Binder, MD;  [facility];  Service: Endoscopy;;  cecal   TOTAL KNEE ARTHROPLASTY Right 06/10/2017   TOTAL KNEE ARTHROPLASTY Right 06/10/2017   Procedure: TOTAL KNEE ARTHROPLASTY;  Surgeon: [name], MD;  Location: [place];  Service: Orthopedics;  Laterality: Right;    There were no vitals filed for this visit.   Subjective Assessment - 07/31/21 0913     Subjective Get some burning in legs when walking. Things feel about the same. Pain about a 2 right now.    Pertinent History bilateral lower leg symptoms, history prostrate cancer    Limitations Standing;Walking    Diagnostic tests xray no MRI yet    Patient Stated Goals to stop having pain.    Currently in Pain? Yes    Pain Score 2     Pain Location Back    Pain Orientation Lower;Posterior    Pain Descriptors / Indicators Aching    Pain Type Chronic pain                               [facility] Adult PT Treatment/Exercise - 07/31/21 0001  Lumbar Exercises: Stretches   Other Lumbar Stretch Exercise seated trunk flexion 10 x 5 second holds   "about the same, maybe better"     Lumbar Exercises: Standing   Row Both;15 reps    Theraband Level (Row) Level 3 (Green)    Shoulder Extension Both;15 reps    Theraband Level (Shoulder Extension) Level 3 (Green)    Other Standing Lumbar Exercises paloff green band 1x 10 bilateral      Lumbar Exercises: Seated   Sit to Stand 10 reps    Sit to Stand Limitations 2 sets      Lumbar Exercises: Supine   Bridge with clamshell 10 reps;3 seconds    Bridge with [employer] Limitations green band      Manual Therapy   Manual Therapy Joint mobilization    Manual therapy comments all manual interventions performed independently of other interventions    Joint Mobilization Grade II PA mobs Lumbar L1-5                       PT Short Term Goals - 06/26/21 0906       PT SHORT TERM GOAL #1   Title Patient will be independent in self management strategies to improve quality of life and functional outcomes.    Time 3    Period Weeks    Status New    Target Date 07/17/21      PT SHORT TERM GOAL #2   Title Patient will report at least 50% improvement in overall symptoms and/or function to demonstrate improved functional mobility    Time 3    Period Weeks    Status New    Target Date 07/17/21      PT SHORT TERM GOAL #3   Title Patient will demonstrate painfree lumbar ROM    Time 3    Period Weeks    Status New    Target Date 07/17/21               PT Long Term Goals - 06/26/21 0907       PT LONG TERM GOAL #1   Title PAtietn will be able to report improved tolerance to walking on different surfaces (concrete).    Time 8    Period Weeks    Status New    Target Date 08/21/21      PT LONG TERM GOAL #2   Title Patient will improve on FOTO score to meet predicted outcomes to demonstrate improved functional mobility.    Time 8    Period Weeks    Status New    Target Date 08/21/21      PT LONG TERM GOAL #3   Title Patient will report at least 75% improvement in overall symptoms and/or function to demonstrate improved functional mobility    Time 8    Period Weeks    Status New    Target Date 08/21/21                   Plan - 07/31/21 0943     Clinical Impression Statement Patient tolerated session well today. Reviewed bridge with clamshell as patient states he wasn't sure if he was doing this correct at home. Patient remains with low level pain in lumbar area. No distinct effect of repeated flexion-based stretching but no worse. Performed manual lumbar mobilizations with patient in prone. Patient noting decreased pain following. Will assess response next session. Continue core progressions as tolerated.  Personal Factors and Comorbidities Comorbidity  1;Comorbidity 2    Comorbidities prostate cancer, TKA R    Examination-Activity Limitations Stairs;Stand;Locomotion Level;Lift;Sit    Examination-Participation Restrictions Community Activity;Meal Prep;Yard Work;Driving    Stability/Clinical Decision Making Evolving/Moderate complexity    Rehab Potential Good    PT Frequency 1x / week    PT Duration 8 weeks    PT Treatment/Interventions ADLs/Self Care Home Management;Aquatic Therapy;Cryotherapy;Electrical Stimulation;Traction;Moist Heat;Balance training;Therapeutic exercise;Therapeutic activities;Stair training;Gait training;Neuromuscular re-education;Patient/family education;Manual techniques;Dry needling;Passive range of motion;Joint Manipulations    PT Next Visit Plan posterior chain strengthening (hamstrings/glutes), quad/hipflexor lengthening, general lumbar mobility. band sidestep. Machine walkout    PT Home Exercise Plan hamstring culrs prone, lumbar extension; 7/13 LTR, bridge 7/20 ab set, bent knee march 7/27 prone hip extension 8/3 active hamstring strech, bridge with band, seated lumbar flexion stretch    Consulted and Agree with Plan of Care Patient             Patient will benefit from skilled therapeutic intervention in order to improve the following deficits and impairments:  Postural dysfunction, Hypomobility, Decreased activity tolerance, Difficulty walking, Decreased range of motion, Pain, Decreased strength  Visit Diagnosis: Difficulty in walking, not elsewhere classified  Chronic midline low back pain with bilateral sciatica  Muscle weakness (generalized)     Problem List Patient Active Problem List   Diagnosis Date Noted   Lumbar radiculopathy 02/28/2021   Closed fracture of distal end of fibula with tibia with routine healing 05/28/2020   Closed fracture of left distal fibula 04/18/2020   ED (erectile dysfunction) 05/11/2019   History of DVT (deep vein thrombosis) 04/16/2018   GERD (gastroesophageal reflux  disease) 10/15/2017   Pulmonary nodule 07/19/2017   Constipation 07/15/2017   History of pulmonary embolism 06/18/2017   Primary localized osteoarthritis of right knee 06/10/2017   Prostate cancer ([place]) 08/17/2015   Exposure to asbestos 08/17/2015   Personal history of colonic polyps 08/08/2014   Vitamin D deficiency 06/05/2014   BPH (benign prostatic hypertrophy) 06/01/2013   Mild hyperlipidemia 06/01/2013   Routine general medical examination at a health care facility 06/01/2013   9:45 AM, 07/31/21 Josue Hector PT DPT  Physical Therapist with [facility]  [phone] [place], [place], [address] Phone: [phone]   Fax:  [phone]  Name: Luis Stewart MRN: [id number] Date of Birth: [birth year]

## 2021-08-07 ENCOUNTER — Other Ambulatory Visit: Payer: Self-pay

## 2021-08-07 ENCOUNTER — Ambulatory Visit (HOSPITAL_COMMUNITY): Payer: Medicare HMO | Admitting: Physical Therapy

## 2021-08-07 ENCOUNTER — Encounter (HOSPITAL_COMMUNITY): Payer: Self-pay | Admitting: Physical Therapy

## 2021-08-07 DIAGNOSIS — M5442 Lumbago with sciatica, left side: Secondary | ICD-10-CM

## 2021-08-07 DIAGNOSIS — M5441 Lumbago with sciatica, right side: Secondary | ICD-10-CM | POA: Diagnosis not present

## 2021-08-07 DIAGNOSIS — G8929 Other chronic pain: Secondary | ICD-10-CM | POA: Diagnosis not present

## 2021-08-07 DIAGNOSIS — R262 Difficulty in walking, not elsewhere classified: Secondary | ICD-10-CM | POA: Diagnosis not present

## 2021-08-07 DIAGNOSIS — M6281 Muscle weakness (generalized): Secondary | ICD-10-CM

## 2021-08-07 NOTE — Therapy (Signed)
[place] [address] [place], [place], [address] Phone: [phone]   Fax:  [phone]  Physical Therapy Treatment and Progress Note  Patient Details  Name: Luis Stewart MRN: [id number] Date of Birth: 08/17/1951 Referring Provider (PT): Lindell Spar   Progress Note Reporting Period 06/26/21 to 08/07/21  See note below for Objective Data and Assessment of Progress/Goals.      Encounter Date: 08/07/2021   PT End of Session - 08/07/21 0913     Visit Number 7    Number of Visits 8    Date for PT Re-Evaluation 08/21/21    Authorization Type [employer] no auth of VL    Progress Note Due on Visit 10    PT Start Time 0915    PT Stop Time 0955    PT Time Calculation (min) 40 min    Activity Tolerance Patient tolerated treatment well    Behavior During Therapy [facility] for tasks assessed/performed             Past Medical History:  Diagnosis Date   Arthritis    Asbestosis ([place])    Cyst of kidney, acquired    Headache    History of fracture of leg    Right, childhood   Hx of migraine headaches    Hyperlipidemia    Neuropathy    PE (pulmonary thromboembolism) ([place])    After knee surgery   Prostate cancer ([place]) 2015   Adenocarcinoma by biopsy    Past Surgical History:  Procedure Laterality Date   BIOPSY PROSTATE  2003 and 2005   COLONOSCOPY  11/28/2008   Dr. Rourk:internal hemorrhoids/tortus colon/ascending colon polyps, tubular adenomas   COLONOSCOPY N/A 09/04/2014   Procedure: COLONOSCOPY;  Surgeon: Daneil Dolin, MD;  [facility];  Service: Endoscopy;  Laterality: N/A;  9:30   COLONOSCOPY N/A 07/17/2017   Procedure: COLONOSCOPY;  Surgeon: Danie Binder, MD;  [facility];  Service: Endoscopy;  Laterality: N/A;   ESOPHAGOGASTRODUODENOSCOPY N/A 07/16/2017   Procedure: ESOPHAGOGASTRODUODENOSCOPY (EGD);  Surgeon: Danie Binder, MD;  [facility];  Service:  Endoscopy;  Laterality: N/A;   GIVENS CAPSULE STUDY  07/16/2017   Procedure: GIVENS CAPSULE STUDY;  Surgeon: Danie Binder, MD;  [facility];  Service: Endoscopy;;   JOINT REPLACEMENT N/A    Phreesia 01/12/2021   POLYPECTOMY  07/17/2017   Procedure: POLYPECTOMY;  Surgeon: Danie Binder, MD;  [facility];  Service: Endoscopy;;  cecal   TOTAL KNEE ARTHROPLASTY Right 06/10/2017   TOTAL KNEE ARTHROPLASTY Right 06/10/2017   Procedure: TOTAL KNEE ARTHROPLASTY;  Surgeon: [name], MD;  Location: [place];  Service: Orthopedics;  Laterality: Right;    There were no vitals filed for this visit.   Subjective Assessment - 08/07/21 0939     Subjective Patient reports that overall he feels about 50% better. Stats that he doesn't have pain doesn't happen all the time but at times he does have pain in his back and legs. Reports mornings and getting up from a stationary position he has increased pain. Current pain is 2/10 in his legs.    Pertinent History bilateral lower leg symptoms, history prostrate cancer    Limitations Standing;Walking    Diagnostic tests xray no MRI yet    Patient Stated Goals to stop having pain.    Currently in Pain? Yes    Pain Location Leg    Pain Orientation  Lower;Left    Pain Descriptors / Indicators Aching                [facility] PT Assessment - 08/07/21 0001       Assessment   Medical Diagnosis LBP    Referring Provider (PT) Lindell Spar    Next MD Visit 10/10/21      Observation/Other Assessments   Focus on Therapeutic Outcomes (FOTO)  83% function, predicted 63% function, initial 53% function      AROM   Lumbar Flexion 25% limited   no pain   Lumbar Extension 75% limited   no pain   Lumbar - Right Side Bend 50% limited   no pain   Lumbar - Left Side Bend 50% limited   no pain     Strength   Strength Assessment Site Hip;Knee;Ankle    Right/Left Hip Left;Right    Right Hip Flexion 5/5    Right Hip Extension 5/5   no  pain   Right Hip ABduction 5/5    Left Hip Flexion 4+/5    Left Hip Extension 4+/5   no pain   Left Hip ABduction 4+/5    Right Knee Flexion 4+/5    Right Knee Extension 5/5    Left Knee Flexion 4+/5    Left Knee Extension 5/5    Right Ankle Dorsiflexion 5/5    Left Ankle Dorsiflexion 5/5                           [facility] Adult PT Treatment/Exercise - 08/07/21 0001       Lumbar Exercises: Stretches   Active Hamstring Stretch 3 reps;Left;Right;30 seconds    Double Knee to Chest Stretch 5 reps;10 seconds    Lower Trunk Rotation 5 reps;10 seconds   B   Prone on Elbows Stretch 10 seconds;5 reps   3 sets                   PT Education - 08/07/21 0946     Education Details on FOTO score, on strength and ROM changes, on current presenation and plan moving forward    Person(s) Educated Patient    Methods Explanation    Comprehension Verbalized understanding              PT Short Term Goals - 08/07/21 0925       PT SHORT TERM GOAL #1   Title Patient will be independent in self management strategies to improve quality of life and functional outcomes.    Baseline every few days    Time 3    Period Weeks    Status On-going    Target Date 07/17/21      PT SHORT TERM GOAL #2   Title Patient will report at least 50% improvement in overall symptoms and/or function to demonstrate improved functional mobility    Baseline 50%better    Time 3    Period Weeks    Status Achieved    Target Date 07/17/21      PT SHORT TERM GOAL #3   Title Patient will demonstrate painfree lumbar ROM    Time 3    Period Weeks    Status Achieved    Target Date 07/17/21               PT Long Term Goals - 08/07/21 0926       PT LONG TERM GOAL #1   Title PAtietn will be  able to report improved tolerance to walking on different surfaces (concrete).    Baseline able to tolerate better per pt report    Time 8    Period Weeks    Status Achieved      PT LONG TERM  GOAL #2   Title Patient will improve on FOTO score to meet predicted outcomes to demonstrate improved functional mobility.    Time 8    Period Weeks    Status Achieved      PT LONG TERM GOAL #3   Title Patient will report at least 75% improvement in overall symptoms and/or function to demonstrate improved functional mobility    Baseline working torwards - 50% better    Time 8    Period Weeks    Status On-going                   Plan - 08/07/21 0939     Clinical Impression Statement Progress Note performed on this date. Overall patient is progressing well and has met 2/3 short term goals and 2/3 long term goals. Reviewed FOTO score, HEP and current presentation. Answered all questions and anticipate discharge from PT next session. Will continue to develop HEP with focus on lumbar and LE stretches per patient request. Overall patient is doing well and will continue to benefit from skilled physical therapy at this time.    Personal Factors and Comorbidities Comorbidity 1;Comorbidity 2    Comorbidities prostate cancer, TKA R    Examination-Activity Limitations Stairs;Stand;Locomotion Level;Lift;Sit    Examination-Participation Restrictions Community Activity;Meal Prep;Yard Work;Driving    Stability/Clinical Decision Making Evolving/Moderate complexity    Rehab Potential Good    PT Frequency 1x / week    PT Duration 8 weeks    PT Treatment/Interventions ADLs/Self Care Home Management;Aquatic Therapy;Cryotherapy;Electrical Stimulation;Traction;Moist Heat;Balance training;Therapeutic exercise;Therapeutic activities;Stair training;Gait training;Neuromuscular re-education;Patient/family education;Manual techniques;Dry needling;Passive range of motion;Joint Manipulations    PT Next Visit Plan focus on lumbar and LE stretches, print off exercises, anticipate DC next session 8/24    PT Home Exercise Plan hamstring culrs prone, lumbar extension; 7/13 LTR, bridge 7/20 ab set, bent knee march  7/27 prone hip extension 8/3 active hamstring strech, bridge with band, seated lumbar flexion stretch; 8/17 hamstring stretch, prone on elbows,    Consulted and Agree with Plan of Care Patient             Patient will benefit from skilled therapeutic intervention in order to improve the following deficits and impairments:  Postural dysfunction, Hypomobility, Decreased activity tolerance, Difficulty walking, Decreased range of motion, Pain, Decreased strength  Visit Diagnosis: Difficulty in walking, not elsewhere classified  Chronic midline low back pain with bilateral sciatica  Muscle weakness (generalized)     Problem List Patient Active Problem List   Diagnosis Date Noted   Lumbar radiculopathy 02/28/2021   Closed fracture of distal end of fibula with tibia with routine healing 05/28/2020   Closed fracture of left distal fibula 04/18/2020   ED (erectile dysfunction) 05/11/2019   History of DVT (deep vein thrombosis) 04/16/2018   GERD (gastroesophageal reflux disease) 10/15/2017   Pulmonary nodule 07/19/2017   Constipation 07/15/2017   History of pulmonary embolism 06/18/2017   Primary localized osteoarthritis of right knee 06/10/2017   Prostate cancer ([place]) 08/17/2015   Exposure to asbestos 08/17/2015   Personal history of colonic polyps 08/08/2014   Vitamin D deficiency 06/05/2014   BPH (benign prostatic hypertrophy) 06/01/2013   Mild hyperlipidemia 06/01/2013   Routine general medical examination  at a health care facility 06/01/2013   10:45 AM, 08/07/21 Jerene Pitch, DPT Physical Therapy with [facility]  [phone] office   [place] [address] [place], [place], [address] Phone: [phone]   Fax:  [phone]  Name: Luis Stewart MRN: [id number] Date of Birth: 20-Mar-1951

## 2021-08-14 ENCOUNTER — Other Ambulatory Visit: Payer: Self-pay

## 2021-08-14 ENCOUNTER — Encounter (HOSPITAL_COMMUNITY): Payer: Self-pay

## 2021-08-14 ENCOUNTER — Ambulatory Visit (HOSPITAL_COMMUNITY): Payer: Medicare HMO

## 2021-08-14 DIAGNOSIS — M5442 Lumbago with sciatica, left side: Secondary | ICD-10-CM | POA: Diagnosis not present

## 2021-08-14 DIAGNOSIS — G8929 Other chronic pain: Secondary | ICD-10-CM | POA: Diagnosis not present

## 2021-08-14 DIAGNOSIS — M6281 Muscle weakness (generalized): Secondary | ICD-10-CM

## 2021-08-14 DIAGNOSIS — R262 Difficulty in walking, not elsewhere classified: Secondary | ICD-10-CM | POA: Diagnosis not present

## 2021-08-14 DIAGNOSIS — M5441 Lumbago with sciatica, right side: Secondary | ICD-10-CM | POA: Diagnosis not present

## 2021-08-14 NOTE — Therapy (Signed)
[facility] [place], [place], [address] Phone: [phone]   Fax:  [phone] PHYSICAL THERAPY DISCHARGE SUMMARY  Visits from Start of Care: 8  Current functional level related to goals / functional outcomes: See progress note 08/07/21, pt is currently pleased with current functional status and ready to progress at home with HEP   Remaining deficits: See progress note 08/07/21, pt is currently pleased with current functional status and ready to progress at home with HEP   Education / Equipment: HEP   Patient agrees to discharge. Patient goals were partially met. Patient is being discharged due to being pleased with the current functional level.   Physical Therapy Treatment  Patient Details  Name: Luis Stewart MRN: [id number] Date of Birth: October 25, 1951 Referring Provider (PT): Lindell Spar   Encounter Date: 08/14/2021   PT End of Session - 08/14/21 0909     Visit Number 8    Number of Visits 8    Date for PT Re-Evaluation 08/21/21    Authorization Type [employer] no auth of VL    Progress Note Due on Visit 10    PT Start Time 0902    PT Stop Time 0934    PT Time Calculation (min) 32 min    Activity Tolerance Patient tolerated treatment well    Behavior During Therapy [facility] for tasks assessed/performed             Past Medical History:  Diagnosis Date   Arthritis    Asbestosis ([place])    Cyst of kidney, acquired    Headache    History of fracture of leg    Right, childhood   Hx of migraine headaches    Hyperlipidemia    Neuropathy    PE (pulmonary thromboembolism) ([place])    After knee surgery   Prostate cancer ([place]) 2015   Adenocarcinoma by biopsy    Past Surgical History:  Procedure Laterality Date   BIOPSY PROSTATE  2003 and 2005   COLONOSCOPY  11/28/2008   Dr. Rourk:internal hemorrhoids/tortus colon/ascending colon polyps, tubular adenomas   COLONOSCOPY N/A 09/04/2014    Procedure: COLONOSCOPY;  Surgeon: Daneil Dolin, MD;  [facility];  Service: Endoscopy;  Laterality: N/A;  9:30   COLONOSCOPY N/A 07/17/2017   Procedure: COLONOSCOPY;  Surgeon: Danie Binder, MD;  [facility];  Service: Endoscopy;  Laterality: N/A;   ESOPHAGOGASTRODUODENOSCOPY N/A 07/16/2017   Procedure: ESOPHAGOGASTRODUODENOSCOPY (EGD);  Surgeon: Danie Binder, MD;  [facility];  Service: Endoscopy;  Laterality: N/A;   GIVENS CAPSULE STUDY  07/16/2017   Procedure: GIVENS CAPSULE STUDY;  Surgeon: Danie Binder, MD;  [facility];  Service: Endoscopy;;   JOINT REPLACEMENT N/A    Phreesia 01/12/2021   POLYPECTOMY  07/17/2017   Procedure: POLYPECTOMY;  Surgeon: Danie Binder, MD;  [facility];  Service: Endoscopy;;  cecal   TOTAL KNEE ARTHROPLASTY Right 06/10/2017   TOTAL KNEE ARTHROPLASTY Right 06/10/2017   Procedure: TOTAL KNEE ARTHROPLASTY;  Surgeon: [name], MD;  Location: [facility];  Service: Orthopedics;  Laterality: Right;    There were no vitals filed for this visit.   Subjective Assessment - 08/14/21 0907     Subjective Pt reports "very little" pain in low back, rated 1/10. Pt excited to d/c from therapy today.    Pertinent History bilateral lower leg symptoms, history prostrate cancer    Limitations Standing;Walking  Diagnostic tests xray no MRI yet    Patient Stated Goals to stop having pain.    Currently in Pain? Yes    Pain Score 1     Pain Location Back    Pain Orientation Lower;Left    Pain Descriptors / Indicators Aching    Pain Type Chronic pain                   [facility] Adult PT Treatment/Exercise - 08/14/21 0001       Lumbar Exercises: Stretches   Double Knee to Chest Stretch 5 reps;10 seconds    Lower Trunk Rotation 5 reps;10 seconds   bil   Prone on Elbows Stretch 10 seconds;5 reps   3 sets     Lumbar Exercises: Standing   Row Both;10 reps    Theraband Level (Row) Level 4 (Blue)     Shoulder Extension Both;10 reps    Theraband Level (Shoulder Extension) Level 4 (Blue)    Other Standing Lumbar Exercises pronated row with elbows at shoulder height, both, 10 reps, blue band      Lumbar Exercises: Supine   Bridge with clamshell 10 reps;3 seconds    Bridge with [employer] Limitations green band                    PT Education - 08/14/21 0908     Education Details Exercise technique, finalized HEP    Person(s) Educated Patient    Methods Explanation;Handout    Comprehension Verbalized understanding              PT Short Term Goals - 08/07/21 0925       PT SHORT TERM GOAL #1   Title Patient will be independent in self management strategies to improve quality of life and functional outcomes.    Baseline every few days    Time 3    Period Weeks    Status On-going    Target Date 07/17/21      PT SHORT TERM GOAL #2   Title Patient will report at least 50% improvement in overall symptoms and/or function to demonstrate improved functional mobility    Baseline 50%better    Time 3    Period Weeks    Status Achieved    Target Date 07/17/21      PT SHORT TERM GOAL #3   Title Patient will demonstrate painfree lumbar ROM    Time 3    Period Weeks    Status Achieved    Target Date 07/17/21               PT Long Term Goals - 08/07/21 0926       PT LONG TERM GOAL #1   Title PAtietn will be able to report improved tolerance to walking on different surfaces (concrete).    Baseline able to tolerate better per pt report    Time 8    Period Weeks    Status Achieved      PT LONG TERM GOAL #2   Title Patient will improve on FOTO score to meet predicted outcomes to demonstrate improved functional mobility.    Time 8    Period Weeks    Status Achieved      PT LONG TERM GOAL #3   Title Patient will report at least 75% improvement in overall symptoms and/or function to demonstrate improved functional mobility    Baseline working torwards -  50% better    Time 8  Period Weeks    Status On-going                   Plan - 08/14/21 0909     Clinical Impression Statement Reviewed HEP, pt performed with intermittent cues on how to progress at home. Pt demonstrates good muscle activation and form, ready to d/c home and progress independently with HEP.    Personal Factors and Comorbidities Comorbidity 1;Comorbidity 2    Comorbidities prostate cancer, TKA R    Examination-Activity Limitations Stairs;Stand;Locomotion Level;Lift;Sit    Examination-Participation Restrictions Community Activity;Meal Prep;Yard Work;Driving    Stability/Clinical Decision Making Evolving/Moderate complexity    Rehab Potential Good    PT Frequency 1x / week    PT Duration 8 weeks    PT Treatment/Interventions ADLs/Self Care Home Management;Aquatic Therapy;Cryotherapy;Electrical Stimulation;Traction;Moist Heat;Balance training;Therapeutic exercise;Therapeutic activities;Stair training;Gait training;Neuromuscular re-education;Patient/family education;Manual techniques;Dry needling;Passive range of motion;Joint Manipulations    PT Next Visit Plan d/c to HEP    PT Home Exercise Plan hamstring culrs prone, lumbar extension; 7/13 LTR, bridge 7/20 ab set, bent knee march 7/27 prone hip extension 8/3 active hamstring strech, bridge with band, seated lumbar flexion stretch; 8/17 hamstring stretch, prone on elbows 8/24: LTR, double knee to chest, pronated row with TB, row with TB, shoulder extension with TB (provided home TB with anchor)    Consulted and Agree with Plan of Care Patient             Patient will benefit from skilled therapeutic intervention in order to improve the following deficits and impairments:  Postural dysfunction, Hypomobility, Decreased activity tolerance, Difficulty walking, Decreased range of motion, Pain, Decreased strength  Visit Diagnosis: Difficulty in walking, not elsewhere classified  Chronic midline low back pain with  bilateral sciatica  Muscle weakness (generalized)     Problem List Patient Active Problem List   Diagnosis Date Noted   Lumbar radiculopathy 02/28/2021   Closed fracture of distal end of fibula with tibia with routine healing 05/28/2020   Closed fracture of left distal fibula 04/18/2020   ED (erectile dysfunction) 05/11/2019   History of DVT (deep vein thrombosis) 04/16/2018   GERD (gastroesophageal reflux disease) 10/15/2017   Pulmonary nodule 07/19/2017   Constipation 07/15/2017   History of pulmonary embolism 06/18/2017   Primary localized osteoarthritis of right knee 06/10/2017   Prostate cancer ([place]) 08/17/2015   Exposure to asbestos 08/17/2015   Personal history of colonic polyps 08/08/2014   Vitamin D deficiency 06/05/2014   BPH (benign prostatic hypertrophy) 06/01/2013   Mild hyperlipidemia 06/01/2013   Routine general medical examination at a health care facility 06/01/2013     Talbot Grumbling PT, DPT 08/14/21, 9:38 AM    [facility] [address] [place], [place], [address] Phone: [phone]   Fax:  [phone]  Name: Luis Stewart MRN: [id number] Date of Birth: 18-Jun-1951

## 2021-08-21 ENCOUNTER — Encounter (HOSPITAL_COMMUNITY): Payer: Medicare HMO | Admitting: Physical Therapy

## 2021-09-01 DIAGNOSIS — Z20822 Contact with and (suspected) exposure to covid-19: Secondary | ICD-10-CM | POA: Diagnosis not present

## 2021-09-03 ENCOUNTER — Encounter: Payer: Self-pay | Admitting: Nurse Practitioner

## 2021-09-03 ENCOUNTER — Ambulatory Visit (INDEPENDENT_AMBULATORY_CARE_PROVIDER_SITE_OTHER): Payer: Medicare HMO | Admitting: Nurse Practitioner

## 2021-09-03 ENCOUNTER — Other Ambulatory Visit: Payer: Self-pay

## 2021-09-03 VITALS — Ht 74.0 in | Wt 178.0 lb

## 2021-09-03 DIAGNOSIS — R300 Dysuria: Secondary | ICD-10-CM | POA: Diagnosis not present

## 2021-09-03 DIAGNOSIS — U071 COVID-19: Secondary | ICD-10-CM | POA: Insufficient documentation

## 2021-09-03 HISTORY — DX: COVID-19: U07.1

## 2021-09-03 MED ORDER — NIRMATRELVIR/RITONAVIR (PAXLOVID) TABLET (RENAL DOSING): 2.0000 | ORAL_TABLET | Freq: Two times a day (BID) | ORAL | 0 refills | Status: DC

## 2021-09-03 MED ORDER — PREDNISONE 20 MG PO TABS: 40.0000 mg | ORAL_TABLET | Freq: Every day | ORAL | 0 refills | Status: DC

## 2021-09-03 NOTE — Assessment & Plan Note (Signed)
-  he will drop off urine sample

## 2021-09-03 NOTE — Progress Notes (Signed)
Acute Office Visit  Subjective:    Patient ID: Luis Stewart, male    DOB: [birth year], 70 y.o.   MRN: [id number]  Chief Complaint  Patient presents with   Covid Positive    Tested positive x3 days ago. Symptoms started x4 days ago. Having diarrhea, sinus congestion, productive cough, and body aches. Symptoms are all getting some better.     HPI Patient is in today for sick visit. Symptoms started 9/9 and home test was positive on 9/10. He has been taking dayquil/nyquil.  Past Medical History:  Diagnosis Date   Arthritis    Asbestosis ([place])    Cyst of kidney, acquired    Headache    History of fracture of leg    Right, childhood   Hx of migraine headaches    Hyperlipidemia    Neuropathy    PE (pulmonary thromboembolism) ([place])    After knee surgery   Prostate cancer ([place]) 2015   Adenocarcinoma by biopsy    Past Surgical History:  Procedure Laterality Date   BIOPSY PROSTATE  2003 and 2005   COLONOSCOPY  11/28/2008   Dr. Rourk:internal hemorrhoids/tortus colon/ascending colon polyps, tubular adenomas   COLONOSCOPY N/A 09/04/2014   Procedure: COLONOSCOPY;  Surgeon: Daneil Dolin, MD;  [facility];  Service: Endoscopy;  Laterality: N/A;  9:30   COLONOSCOPY N/A 07/17/2017   Procedure: COLONOSCOPY;  Surgeon: Danie Binder, MD;  [facility];  Service: Endoscopy;  Laterality: N/A;   ESOPHAGOGASTRODUODENOSCOPY N/A 07/16/2017   Procedure: ESOPHAGOGASTRODUODENOSCOPY (EGD);  Surgeon: Danie Binder, MD;  [facility];  Service: Endoscopy;  Laterality: N/A;   GIVENS CAPSULE STUDY  07/16/2017   Procedure: GIVENS CAPSULE STUDY;  Surgeon: Danie Binder, MD;  [facility];  Service: Endoscopy;;   JOINT REPLACEMENT N/A    Phreesia 01/12/2021   POLYPECTOMY  07/17/2017   Procedure: POLYPECTOMY;  Surgeon: Danie Binder, MD;  [facility];  Service: Endoscopy;;  cecal   TOTAL KNEE ARTHROPLASTY Right 06/10/2017    TOTAL KNEE ARTHROPLASTY Right 06/10/2017   Procedure: TOTAL KNEE ARTHROPLASTY;  Surgeon: [name], MD;  Location: [facility];  Service: Orthopedics;  Laterality: Right;    Family History  Problem Relation Age of Onset   Diabetes Mother    Hypertension Mother    Obesity Mother    Heart disease Mother    Mental illness Mother    Hypertension Sister    Obesity Sister    Arthritis Sister    Deep vein thrombosis Father    Dementia Father    Heart disease Father        CABG   Colon cancer Neg Hx     Social History   Socioeconomic History   Marital status: Married    Spouse name: Not on file   Number of children: Not on file   Years of education: Not on file   Highest education level: Not on file  Occupational History   Occupation: Retired  Tobacco Use   Smoking status: Never   Smokeless tobacco: Never  Vaping Use   Vaping Use: Never used  Substance and Sexual Activity   Alcohol use: No   Drug use: No   Sexual activity: Yes  Other Topics Concern   Not on file  Social History Narrative   Not on file   Social Determinants of Health   Financial Resource Strain: Not on file  Food Insecurity: Not on file  Transportation Needs: Not on file  Physical Activity: Not on file  Stress: Not on file  Social Connections: Not on file  Intimate Partner Violence: Not on file    Outpatient Medications Prior to Visit  Medication Sig Dispense Refill   acetaminophen (TYLENOL) 500 MG tablet Take 1,000 mg by mouth every 6 (six) hours as needed for mild pain or moderate pain.     Cholecalciferol (VITAMIN D) 2000 units CAPS Take 2,000 Units by mouth 3 (three) times a week.      Multiple Vitamin (MULTIVITAMIN WITH MINERALS) TABS tablet Take 1 tablet by mouth daily. Centrum Silver for Men     nitroGLYCERIN (NITROSTAT) 0.4 MG SL tablet Place 1 tablet (0.4 mg total) under the tongue every 5 (five) minutes as needed for chest pain. 30 tablet 0   pantoprazole (PROTONIX) 40 MG tablet Take 40  mg by mouth every other day.     No facility-administered medications prior to visit.    Allergies  Allergen Reactions   Sulfa Antibiotics Other (See Comments)    Unknown reaction. Childhood reaction    Review of Systems  Constitutional:  Negative for chills, fatigue and fever.  HENT:  Positive for congestion.   Respiratory:  Positive for cough.   Gastrointestinal:  Positive for diarrhea.  Genitourinary:  Positive for dysuria.  Musculoskeletal:  Positive for myalgias.      Objective:    Physical Exam  Ht '6\' 2"'  (1.88 m)   Wt 178 lb (80.7 kg)   BMI 22.85 kg/m  Wt Readings from Last 3 Encounters:  09/03/21 178 lb (80.7 kg)  06/06/21 178 lb 12.8 oz (81.1 kg)  05/28/21 179 lb (81.2 kg)    There are no preventive care reminders to display for this patient.  There are no preventive care reminders to display for this patient.   Lab Results  Component Value Date   TSH 4.672 (H) 06/01/2014   Lab Results  Component Value Date   WBC 3.9 01/11/2021   HGB 13.3 01/11/2021   HCT 39.6 01/11/2021   MCV 91.0 01/11/2021   PLT 142 01/11/2021   Lab Results  Component Value Date   NA 142 01/11/2021   K 4.4 01/11/2021   CO2 29 01/11/2021   GLUCOSE 81 01/11/2021   BUN 16 03/20/2021   CREATININE 1.31 (H) 03/20/2021   BILITOT 0.8 01/11/2021   ALKPHOS 121 08/14/2018   AST 23 01/11/2021   ALT 16 01/11/2021   PROT 7.2 01/11/2021   ALBUMIN 3.9 08/14/2018   CALCIUM 9.6 01/11/2021   ANIONGAP 5 10/28/2018   EGFR 59 (L) 03/20/2021   Lab Results  Component Value Date   CHOL 168 01/11/2021   Lab Results  Component Value Date   HDL 55 01/11/2021   Lab Results  Component Value Date   LDLCALC 95 01/11/2021   Lab Results  Component Value Date   TRIG 85 01/11/2021   Lab Results  Component Value Date   CHOLHDL 3.1 01/11/2021   No results found for: HGBA1C     Assessment & Plan:   Problem List Items Addressed This Visit       Other   COVID-19 - Primary     -positive home test -Rx. Prednisone and paxlovid (renally dosed for GFR 59)       Dysuria    -he will drop off urine sample        No orders of the defined types were placed in this encounter.  Date:  09/03/2021  Location of Patient: Home Location of Provider: Office Consent was obtain for visit to be over via telehealth. I verified that I am speaking with the correct person using two identifiers.  I connected with  Luis Stewart on 09/03/21 via telephone and verified that I am speaking with the correct person using two identifiers.   I discussed the limitations of evaluation and management by telemedicine. The patient expressed understanding and agreed to proceed.  Time spent: 8 minutes    Noreene Larsson, NP

## 2021-09-03 NOTE — Assessment & Plan Note (Signed)
-  positive home test -Rx. Prednisone and paxlovid (renally dosed for GFR 59)

## 2021-09-04 ENCOUNTER — Other Ambulatory Visit: Payer: Self-pay | Admitting: Nurse Practitioner

## 2021-09-04 ENCOUNTER — Telehealth: Payer: Self-pay

## 2021-09-04 DIAGNOSIS — U071 COVID-19: Secondary | ICD-10-CM

## 2021-09-04 MED ORDER — NIRMATRELVIR/RITONAVIR (PAXLOVID) TABLET (RENAL DOSING): 2.0000 | ORAL_TABLET | Freq: Two times a day (BID) | ORAL | 0 refills | Status: DC

## 2021-09-04 NOTE — Telephone Encounter (Signed)
There were no medication interactions in the drug database, so it should be ok to take.

## 2021-09-04 NOTE — Telephone Encounter (Signed)
Patient called has some questions on the medicines that was prescribed to him yesterday, can you give him a call back.

## 2021-09-04 NOTE — Telephone Encounter (Signed)
Pt has concerns about the Paxlovid having interactions with his medications. Wanted to make sure you felt this was okay to take. He said this is a fairly new drug and he just has some concerns after reading up on it.

## 2021-09-04 NOTE — Telephone Encounter (Signed)
Pt informed

## 2021-09-18 ENCOUNTER — Other Ambulatory Visit: Payer: Medicare HMO

## 2021-09-18 ENCOUNTER — Other Ambulatory Visit: Payer: Self-pay

## 2021-09-18 DIAGNOSIS — N401 Enlarged prostate with lower urinary tract symptoms: Secondary | ICD-10-CM | POA: Diagnosis not present

## 2021-09-18 DIAGNOSIS — R350 Frequency of micturition: Secondary | ICD-10-CM

## 2021-09-19 LAB — PSA: Prostate Specific Ag, Serum: 34.5 ng/mL — ABNORMAL HIGH (ref 0.0–4.0)

## 2021-09-24 ENCOUNTER — Other Ambulatory Visit: Payer: Self-pay

## 2021-09-24 ENCOUNTER — Ambulatory Visit: Payer: Medicare HMO | Admitting: Urology

## 2021-09-24 ENCOUNTER — Encounter: Payer: Self-pay | Admitting: Urology

## 2021-09-24 VITALS — BP 150/82 | HR 69 | Temp 98.3°F | Wt 180.6 lb

## 2021-09-24 DIAGNOSIS — R350 Frequency of micturition: Secondary | ICD-10-CM | POA: Diagnosis not present

## 2021-09-24 DIAGNOSIS — N401 Enlarged prostate with lower urinary tract symptoms: Secondary | ICD-10-CM | POA: Diagnosis not present

## 2021-09-24 DIAGNOSIS — C61 Malignant neoplasm of prostate: Secondary | ICD-10-CM

## 2021-09-24 LAB — MICROSCOPIC EXAMINATION
Bacteria, UA: NONE SEEN
Renal Epithel, UA: NONE SEEN /hpf
WBC, UA: NONE SEEN /hpf (ref 0–5)

## 2021-09-24 LAB — URINALYSIS, ROUTINE W REFLEX MICROSCOPIC
Bilirubin, UA: NEGATIVE
Glucose, UA: NEGATIVE
Ketones, UA: NEGATIVE
Leukocytes,UA: NEGATIVE
Nitrite, UA: NEGATIVE
Protein,UA: NEGATIVE
Specific Gravity, UA: 1.015 (ref 1.005–1.030)
Urobilinogen, Ur: 0.2 mg/dL (ref 0.2–1.0)
pH, UA: 5.5 (ref 5.0–7.5)

## 2021-09-24 NOTE — Progress Notes (Signed)
Urological Symptom Review  Patient is experiencing the following symptoms: Frequent urination Get up at night to urinate Urinary tract infection   Review of Systems  Gastrointestinal (upper)  : Negative for upper GI symptoms  Gastrointestinal (lower) : Diarrhea  Constitutional : Negative for symptoms  Skin: Skin rash/lesion  Eyes: Negative for eye symptoms  Ear/Nose/Throat : Sinus problems  Hematologic/Lymphatic: Negative for Hematologic/Lymphatic symptoms  Cardiovascular : Chest pain  Respiratory : Negative for respiratory symptoms  Endocrine: Negative for endocrine symptoms  Musculoskeletal: Back pain Joint pain  Neurological: Negative for neurological symptoms  Psychologic: Negative for psychiatric symptoms

## 2021-09-24 NOTE — Progress Notes (Signed)
History of Present Illness:  Prior history of elevated PSA--s/p TRUS/Bx in [place] in 2005 and 2008. Apparently, all cores were negative but some were not indicative of prostate tissue at all.   Because of a high PCA 3 test in this office, he underwent TRUS/Bx on 6.23.2016. 1/12 cores were positive for adenocarcinoma-10% of the core in the right lateral base revealed Gleason 3+3 equal 6 adenocarcinoma. Prostate volume was 175 cc. PSA was 16.66. PSA density 0.09. He does not have significant lower urinary tract symptomatology.   He consulted with Dr. Louis Meckel in [place] to consider robotic prostatectomy. Due to his low volume prostate cancer, and very low PSA density, it was recommended that he continue with active surveillance.   1.30.2017: Surveillance TRUS/Bx , following a prostatic MRI which revealed no suspicious prostatic lesions and correlated the patient's large prostate. At that time, all 12 cores were negative for prostate cancer. Prostatic volume was 190 mL.   8.21.2019: fusion TRUS/Bx. Recent prostate MRI--volume 220 ml. No significant lesions. No evidence transcapsular/perineural,SV/bony or lymphatic disease. Path--1 core (left apex) revealed GS 3+3 pattern in 5% of core.    PSA followup:   10.18.2016--17.16 11.14.2017--15.4 5.25.2018--17.9 4.29.2019--18.4 2.25.2020--15.9 3.30.2021--15.5 3.30.2022--22.4   9.24.2021 MRI prostate. Prostate volume 250 mL.   No significant change compared to prior exam. No radiographic evidence of high-grade prostate carcinoma. PI-RADS 2: Low (clinically significant cancer is unlikely to be present)   4.5.2022: PSA 22.4.  10.4.2022:  PSA 34.5 this was drawn while the patient was being treated for an active COVID infection.  He was put on finasteride after his last visit.  He was unable to tolerate it due to worsening of his lower urinary tract symptoms.  Past Medical History:  Diagnosis Date   Arthritis    Asbestosis ([place])    Cyst of  kidney, acquired    Headache    History of fracture of leg    Right, childhood   Hx of migraine headaches    Hyperlipidemia    Neuropathy    PE (pulmonary thromboembolism) ([place])    After knee surgery   Prostate cancer ([place]) 2015   Adenocarcinoma by biopsy    Past Surgical History:  Procedure Laterality Date   BIOPSY PROSTATE  2003 and 2005   COLONOSCOPY  11/28/2008   Dr. Rourk:internal hemorrhoids/tortus colon/ascending colon polyps, tubular adenomas   COLONOSCOPY N/A 09/04/2014   Procedure: COLONOSCOPY;  Surgeon: Daneil Dolin, MD;  [facility];  Service: Endoscopy;  Laterality: N/A;  9:30   COLONOSCOPY N/A 07/17/2017   Procedure: COLONOSCOPY;  Surgeon: Danie Binder, MD;  [facility];  Service: Endoscopy;  Laterality: N/A;   ESOPHAGOGASTRODUODENOSCOPY N/A 07/16/2017   Procedure: ESOPHAGOGASTRODUODENOSCOPY (EGD);  Surgeon: Danie Binder, MD;  [facility];  Service: Endoscopy;  Laterality: N/A;   GIVENS CAPSULE STUDY  07/16/2017   Procedure: GIVENS CAPSULE STUDY;  Surgeon: Danie Binder, MD;  [facility];  Service: Endoscopy;;   JOINT REPLACEMENT N/A    Phreesia 01/12/2021   POLYPECTOMY  07/17/2017   Procedure: POLYPECTOMY;  Surgeon: Danie Binder, MD;  [facility];  Service: Endoscopy;;  cecal   TOTAL KNEE ARTHROPLASTY Right 06/10/2017   TOTAL KNEE ARTHROPLASTY Right 06/10/2017   Procedure: TOTAL KNEE ARTHROPLASTY;  Surgeon: [name], MD;  Location: [facility];  Service: Orthopedics;  Laterality: Right;    Home Medications:  Allergies as of 09/24/2021       Reactions  Sulfa Antibiotics Other (See Comments)   Unknown reaction. Childhood reaction        Medication List        Accurate as of September 24, 2021  8:43 AM. If you have any questions, ask your nurse or doctor.          acetaminophen 500 MG tablet Commonly known as: TYLENOL Take 1,000 mg by mouth every 6 (six) hours as needed for mild pain  or moderate pain.   multivitamin with minerals Tabs tablet Take 1 tablet by mouth daily. Centrum Silver for Men   nitroGLYCERIN 0.4 MG SL tablet Commonly known as: NITROSTAT Place 1 tablet (0.4 mg total) under the tongue every 5 (five) minutes as needed for chest pain.   pantoprazole 40 MG tablet Commonly known as: PROTONIX Take 40 mg by mouth every other day.   predniSONE 20 MG tablet Commonly known as: DELTASONE Take 2 tablets (40 mg total) by mouth daily with breakfast.   Vitamin D 50 MCG (2000 UT) Caps Take 2,000 Units by mouth 3 (three) times a week.        Allergies:  Allergies  Allergen Reactions   Sulfa Antibiotics Other (See Comments)    Unknown reaction. Childhood reaction    Family History  Problem Relation Age of Onset   Diabetes Mother    Hypertension Mother    Obesity Mother    Heart disease Mother    Mental illness Mother    Hypertension Sister    Obesity Sister    Arthritis Sister    Deep vein thrombosis Father    Dementia Father    Heart disease Father        CABG   Colon cancer Neg Hx     Social History:  reports that he has never smoked. He has never used smokeless tobacco. He reports that he does not drink alcohol and does not use drugs.  ROS: A complete review of systems was performed.  All systems are negative except for pertinent findings as noted.  Physical Exam:  Vital signs in last 24 hours: There were no vitals taken for this visit. Constitutional:  Alert and oriented, No acute distress Cardiovascular: Regular rate  Respiratory: Normal respiratory effort Neurologic: Grossly intact, no focal deficits Psychiatric: Normal mood and affect  I have reviewed prior pt notes  I have reviewed notes from referring/previous physicians  I have reviewed urinalysis results  I have independently reviewed prior imaging-MRI results  I have reviewed prior PSA results    Impression/Assessment:  1.  BPH with huge gland.  Relatively  minimal symptoms not on any medication  2.  Elevated PSA.  He does have an increasing trend recently which may be due to concurrent COVID infection.  Despite that, PSA density under 0.15  3.  Very low risk prostate cancer based on minimal GG 1 disease  Plan:  1.  I will have him come back in 6 weeks for repeat PSA  2.  If continued upward trend consider repeat MRI/biopsy  3.  If we need to, at any point, restart finasteride, consider pretreating with tamsulosin  4.  Default appointment in 6 months

## 2021-10-10 ENCOUNTER — Ambulatory Visit (INDEPENDENT_AMBULATORY_CARE_PROVIDER_SITE_OTHER): Payer: Medicare HMO | Admitting: Internal Medicine

## 2021-10-10 ENCOUNTER — Encounter: Payer: Self-pay | Admitting: Internal Medicine

## 2021-10-10 ENCOUNTER — Other Ambulatory Visit: Payer: Self-pay

## 2021-10-10 VITALS — BP 135/77 | HR 64 | Temp 98.4°F | Ht 74.0 in | Wt 180.0 lb

## 2021-10-10 DIAGNOSIS — Z23 Encounter for immunization: Secondary | ICD-10-CM

## 2021-10-10 DIAGNOSIS — M5416 Radiculopathy, lumbar region: Secondary | ICD-10-CM

## 2021-10-10 DIAGNOSIS — U099 Post covid-19 condition, unspecified: Secondary | ICD-10-CM

## 2021-10-10 DIAGNOSIS — Z0000 Encounter for general adult medical examination without abnormal findings: Secondary | ICD-10-CM

## 2021-10-10 DIAGNOSIS — Z0001 Encounter for general adult medical examination with abnormal findings: Secondary | ICD-10-CM

## 2021-10-10 MED ORDER — BENZONATATE 100 MG PO CAPS: 100.0000 mg | ORAL_CAPSULE | Freq: Two times a day (BID) | ORAL | 0 refills | Status: DC | PRN

## 2021-10-10 NOTE — Patient Instructions (Signed)
Please start taking Tessalon for cough as prescribed.  You are being referred to Orthopedic surgery for back pain.  Please get fasting blood tests done before the next visit.

## 2021-10-11 NOTE — Assessment & Plan Note (Signed)
Chronic low back pain radiating to legs with numbness and tingling, referred to PT - competed X-ray of the lumbar spine showed degenerative changes May need MRI of the lumbar spine as persistent symptoms and has underlying prostate ca. - referred to Orthopedic surgery

## 2021-10-11 NOTE — Assessment & Plan Note (Signed)
Reassured about persistent cough after COVID infection, can last for few months Tessalon PRN

## 2021-10-11 NOTE — Progress Notes (Signed)
Established Patient Office Visit  Subjective:  Patient ID: Luis Stewart, male    DOB: 06/25/1951  Age: 70 y.o. MRN: [id number]  CC:  Chief Complaint  Patient presents with   Follow-up    4 month F/U. Still having back and leg pain. Completed PT with minimal relief. Still has cough and SOB after having COVID in September    HPI Luis Stewart is a 70 year old male with PMH of GERD, chronic low back pain, elevated PSA, asbestosis and atypical chest pain who  presents for follow up of his chronic medical conditions.   He has been having chronic low back pain, which is dull, constant, radiating to b/l legs and is associated with b/l LE numbness and tingling. Denies saddle anesthesia, stool or urinary incontinence. He takes Tylenol as needed for pain. He has h/o elevated PSA and prostate cancer. He has completed PT with minimal improvement. Tried to obtain MRI due to concern for metastatic lesions, but was denied by insurance. Of note, his PSA had also elevated recently and is followed by Urology.  He c/o chronic cough since he had COVID about a month ago. Denies any overt wheezing, chest pain or hemoptysis.   Past Medical History:  Diagnosis Date   Arthritis    Asbestosis ([place])    Cyst of kidney, acquired    Headache    History of fracture of leg    Right, childhood   Hx of migraine headaches    Hyperlipidemia    Neuropathy    PE (pulmonary thromboembolism) ([place])    After knee surgery   Prostate cancer ([place]) 2015   Adenocarcinoma by biopsy    Past Surgical History:  Procedure Laterality Date   BIOPSY PROSTATE  2003 and 2005   COLONOSCOPY  11/28/2008   Dr. Rourk:internal hemorrhoids/tortus colon/ascending colon polyps, tubular adenomas   COLONOSCOPY N/A 09/04/2014   Procedure: COLONOSCOPY;  Surgeon: Daneil Dolin, MD;  [facility];  Service: Endoscopy;  Laterality: N/A;  9:30   COLONOSCOPY N/A 07/17/2017   Procedure: COLONOSCOPY;   Surgeon: Danie Binder, MD;  [facility];  Service: Endoscopy;  Laterality: N/A;   ESOPHAGOGASTRODUODENOSCOPY N/A 07/16/2017   Procedure: ESOPHAGOGASTRODUODENOSCOPY (EGD);  Surgeon: Danie Binder, MD;  [facility];  Service: Endoscopy;  Laterality: N/A;   GIVENS CAPSULE STUDY  07/16/2017   Procedure: GIVENS CAPSULE STUDY;  Surgeon: Danie Binder, MD;  [facility];  Service: Endoscopy;;   JOINT REPLACEMENT N/A    Phreesia 01/12/2021   POLYPECTOMY  07/17/2017   Procedure: POLYPECTOMY;  Surgeon: Danie Binder, MD;  [facility];  Service: Endoscopy;;  cecal   TOTAL KNEE ARTHROPLASTY Right 06/10/2017   TOTAL KNEE ARTHROPLASTY Right 06/10/2017   Procedure: TOTAL KNEE ARTHROPLASTY;  Surgeon: [name], MD;  Location: [facility];  Service: Orthopedics;  Laterality: Right;    Family History  Problem Relation Age of Onset   Diabetes Mother    Hypertension Mother    Obesity Mother    Heart disease Mother    Mental illness Mother    Hypertension Sister    Obesity Sister    Arthritis Sister    Deep vein thrombosis Father    Dementia Father    Heart disease Father        CABG   Colon cancer Neg Hx     Social History   Socioeconomic History   Marital status: Married    Spouse  name: Not on file   Number of children: Not on file   Years of education: Not on file   Highest education level: Not on file  Occupational History   Occupation: Retired  Tobacco Use   Smoking status: Never   Smokeless tobacco: Never  Vaping Use   Vaping Use: Never used  Substance and Sexual Activity   Alcohol use: No   Drug use: No   Sexual activity: Yes  Other Topics Concern   Not on file  Social History Narrative   Not on file   Social Determinants of Health   Financial Resource Strain: Not on file  Food Insecurity: Not on file  Transportation Needs: Not on file  Physical Activity: Not on file  Stress: Not on file  Social Connections: Not on  file  Intimate Partner Violence: Not on file    Outpatient Medications Prior to Visit  Medication Sig Dispense Refill   acetaminophen (TYLENOL) 500 MG tablet Take 1,000 mg by mouth every 6 (six) hours as needed for mild pain or moderate pain.     Cholecalciferol (VITAMIN D) 2000 units CAPS Take 2,000 Units by mouth 3 (three) times a week.      Multiple Vitamin (MULTIVITAMIN WITH MINERALS) TABS tablet Take 1 tablet by mouth daily. Centrum Silver for Men     nitroGLYCERIN (NITROSTAT) 0.4 MG SL tablet Place 1 tablet (0.4 mg total) under the tongue every 5 (five) minutes as needed for chest pain. 30 tablet 0   pantoprazole (PROTONIX) 40 MG tablet Take 40 mg by mouth every other day.     PAXLOVID, 150/100, 10 x 150 MG & 10 x 100MG TBPK Take 2 tablets by mouth 2 (two) times daily.     predniSONE (DELTASONE) 20 MG tablet Take 2 tablets (40 mg total) by mouth daily with breakfast. 10 tablet 0   No facility-administered medications prior to visit.    Allergies  Allergen Reactions   Sulfa Antibiotics Other (See Comments)    Unknown reaction. Childhood reaction    ROS Review of Systems  Constitutional:  Negative for chills and fever.  HENT:  Negative for congestion and sore throat.   Eyes:  Negative for pain and discharge.  Respiratory:  Negative for cough and shortness of breath.   Cardiovascular:  Negative for chest pain and palpitations.  Gastrointestinal:  Negative for constipation, diarrhea, nausea and vomiting.  Endocrine: Negative for polydipsia and polyuria.  Genitourinary:  Negative for dysuria and hematuria.  Musculoskeletal:  Positive for back pain. Negative for neck pain and neck stiffness.  Skin:  Negative for rash.  Neurological:  Positive for numbness. Negative for dizziness, weakness and headaches.  Psychiatric/Behavioral:  Negative for agitation and behavioral problems.      Objective:    Physical Exam Vitals reviewed.  Constitutional:      General: He is not in  acute distress.    Appearance: He is not diaphoretic.  HENT:     Head: Normocephalic and atraumatic.     Nose: Nose normal.     Mouth/Throat:     Mouth: Mucous membranes are moist.  Eyes:     General: No scleral icterus.    Extraocular Movements: Extraocular movements intact.  Cardiovascular:     Rate and Rhythm: Normal rate and regular rhythm.     Pulses: Normal pulses.     Heart sounds: Normal heart sounds. No murmur heard. Pulmonary:     Breath sounds: Normal breath sounds. No wheezing or rales.  Musculoskeletal:  Cervical back: Neck supple. No tenderness.     Right lower leg: No edema.     Left lower leg: No edema.  Skin:    General: Skin is warm.     Findings: No rash.  Neurological:     General: No focal deficit present.     Mental Status: He is alert and oriented to person, place, and time.     Sensory: Sensory deficit (B/l feet) present.     Motor: No weakness.  Psychiatric:        Mood and Affect: Mood normal.        Behavior: Behavior normal.    BP 135/77 (BP Location: Left Arm, Patient Position: Sitting, Cuff Size: Normal)   Pulse 64   Temp 98.4 F (36.9 C) (Oral)   Ht '6\' 2"'  (1.88 m)   Wt 180 lb (81.6 kg)   SpO2 97%   BMI 23.11 kg/m  Wt Readings from Last 3 Encounters:  10/10/21 180 lb (81.6 kg)  09/24/21 180 lb 9.6 oz (81.9 kg)  09/03/21 178 lb (80.7 kg)     Health Maintenance Due  Topic Date Due   COVID-19 Vaccine (3 - Moderna risk series) 11/29/2020    There are no preventive care reminders to display for this patient.  Lab Results  Component Value Date   TSH 4.672 (H) 06/01/2014   Lab Results  Component Value Date   WBC 3.9 01/11/2021   HGB 13.3 01/11/2021   HCT 39.6 01/11/2021   MCV 91.0 01/11/2021   PLT 142 01/11/2021   Lab Results  Component Value Date   NA 142 01/11/2021   K 4.4 01/11/2021   CO2 29 01/11/2021   GLUCOSE 81 01/11/2021   BUN 16 03/20/2021   CREATININE 1.31 (H) 03/20/2021   BILITOT 0.8 01/11/2021   ALKPHOS  121 08/14/2018   AST 23 01/11/2021   ALT 16 01/11/2021   PROT 7.2 01/11/2021   ALBUMIN 3.9 08/14/2018   CALCIUM 9.6 01/11/2021   ANIONGAP 5 10/28/2018   EGFR 59 (L) 03/20/2021   Lab Results  Component Value Date   CHOL 168 01/11/2021   Lab Results  Component Value Date   HDL 55 01/11/2021   Lab Results  Component Value Date   LDLCALC 95 01/11/2021   Lab Results  Component Value Date   TRIG 85 01/11/2021   Lab Results  Component Value Date   CHOLHDL 3.1 01/11/2021   No results found for: HGBA1C    Assessment & Plan:   Problem List Items Addressed This Visit       Nervous and Auditory   Lumbar radiculopathy - Primary    Chronic low back pain radiating to legs with numbness and tingling, referred to PT - competed X-ray of the lumbar spine showed degenerative changes May need MRI of the lumbar spine as persistent symptoms and has underlying prostate ca. - referred to Orthopedic surgery      Relevant Orders   Ambulatory referral to Orthopedic Surgery     Other   Post-COVID syndrome    Reassured about persistent cough after COVID infection, can last for few months Tessalon PRN      Relevant Medications   benzonatate (TESSALON) 100 MG capsule   Other Visit Diagnoses        Need for immunization against influenza       Relevant Orders   Flu Vaccine QUAD High Dose(Fluad) (Completed)       Meds ordered this encounter  Medications  benzonatate (TESSALON) 100 MG capsule    Sig: Take 1 capsule (100 mg total) by mouth 2 (two) times daily as needed for cough.    Dispense:  20 capsule    Refill:  0    Follow-up: Return in about 4 months (around 02/10/2022) for Annual physical.    Lindell Spar, MD

## 2021-10-21 ENCOUNTER — Ambulatory Visit: Payer: Medicare HMO | Admitting: Orthopedic Surgery

## 2021-10-21 ENCOUNTER — Other Ambulatory Visit: Payer: Self-pay

## 2021-10-21 ENCOUNTER — Encounter: Payer: Self-pay | Admitting: Orthopedic Surgery

## 2021-10-21 VITALS — BP 127/75 | HR 68 | Ht 74.0 in | Wt 181.2 lb

## 2021-10-21 DIAGNOSIS — M533 Sacrococcygeal disorders, not elsewhere classified: Secondary | ICD-10-CM | POA: Diagnosis not present

## 2021-10-21 DIAGNOSIS — C61 Malignant neoplasm of prostate: Secondary | ICD-10-CM

## 2021-10-21 DIAGNOSIS — M5410 Radiculopathy, site unspecified: Secondary | ICD-10-CM

## 2021-10-21 NOTE — Patient Instructions (Signed)
While we are working on your approval for MRI please go ahead and call to schedule your appointment with [facility] within at least one (1) week.   Central Scheduling [phone]  

## 2021-10-21 NOTE — Progress Notes (Signed)
Chief Complaint  Patient presents with   Back Pain    Lumbar with radiculopathy    HPI: 70 year old male who has a history of chronic lower back pain radiating down his left leg with pain primarily on the left side of his lower back with occasional symptoms on the right.  Most of the pain will radiate to the back of his thighs occasionally all the way down to his foot  Has had physical therapy as well as medical management but was unable to get an MRI according to his primary care doctor and the patient  He does not have weakness but has had numbness in his lower extremities  He also has a history of a high PSA followed by urology he had 112 samples of his prostate biopsy positive back in 2016 and he is followed every 6 months  His lumbar spine film showed a lesion in his sacrum which has not had further work-up as it was thought to be Paget's disease  Patient does note some relief when he flexes the spine    Past Medical History:  Diagnosis Date   Arthritis    Asbestosis ([place])    Cyst of kidney, acquired    Headache    History of fracture of leg    Right, childhood   Hx of migraine headaches    Hyperlipidemia    Neuropathy    PE (pulmonary thromboembolism) ([place])    After knee surgery   Prostate cancer ([place]) 2015   Adenocarcinoma by biopsy    BP 127/75   Pulse 68   Ht 6\' 2"  (1.88 m)   Wt 181 lb 3.2 oz (82.2 kg)   BMI 23.26 kg/m    General appearance: Well-developed well-nourished no gross deformities  Cardiovascular normal pulse and perfusion normal color without edema  Neurologically no sensation loss or deficits or pathologic reflexes  Psychological: Awake alert and oriented x3 mood and affect normal  Skin no lacerations or ulcerations no nodularity no palpable masses, no erythema or nodularity  Musculoskeletal: Tenderness none noted in the lower back straight leg raises were essentially benign he had good reflexes normal strength  Imaging outside imaging  shows a lesion in his sacrum it seems to be a lytic lesion with some sclerosis inside of the sacrum  He has a lumbar spine film which shows degenerative disc disease at multiple levels  A/P  #1 patient should have the sacral lesion evaluated with his history of cancer recommend CT scan  #2  Recommend lumbar spine MRI to evaluate his spinal stenosis

## 2021-10-22 LAB — BASIC METABOLIC PANEL WITH GFR
BUN/Creatinine Ratio: 12 (calc) (ref 6–22)
BUN: 16 mg/dL (ref 7–25)
CO2: 27 mmol/L (ref 20–32)
Calcium: 9.4 mg/dL (ref 8.6–10.3)
Chloride: 106 mmol/L (ref 98–110)
Creat: 1.35 mg/dL — ABNORMAL HIGH (ref 0.70–1.28)
Glucose, Bld: 95 mg/dL (ref 65–99)
Potassium: 4.2 mmol/L (ref 3.5–5.3)
Sodium: 140 mmol/L (ref 135–146)
eGFR: 56 mL/min/{1.73_m2} — ABNORMAL LOW (ref >=60)

## 2021-10-24 ENCOUNTER — Telehealth: Payer: Self-pay | Admitting: Radiology

## 2021-10-24 NOTE — Telephone Encounter (Signed)
Angi Goodell W, RT Hey  [employer]) left message for a Dr. Aline Brochure patient. No identifying information on who the patient was,  just that they needed more information. They did leave call back number: [phone], CASE # [id number] and fax number [phone].

## 2021-10-31 ENCOUNTER — Other Ambulatory Visit: Payer: Self-pay | Admitting: *Deleted

## 2021-10-31 DIAGNOSIS — Z77090 Contact with and (suspected) exposure to asbestos: Secondary | ICD-10-CM

## 2021-11-01 ENCOUNTER — Ambulatory Visit: Payer: Medicare HMO | Admitting: Internal Medicine

## 2021-11-01 ENCOUNTER — Ambulatory Visit: Payer: Medicare HMO | Admitting: Nurse Practitioner

## 2021-11-05 ENCOUNTER — Other Ambulatory Visit: Payer: Medicare HMO

## 2021-11-05 ENCOUNTER — Other Ambulatory Visit: Payer: Self-pay

## 2021-11-05 DIAGNOSIS — R350 Frequency of micturition: Secondary | ICD-10-CM

## 2021-11-05 DIAGNOSIS — N401 Enlarged prostate with lower urinary tract symptoms: Secondary | ICD-10-CM | POA: Diagnosis not present

## 2021-11-06 LAB — PSA: Prostate Specific Ag, Serum: 22.5 ng/mL — ABNORMAL HIGH (ref 0.0–4.0)

## 2021-11-18 ENCOUNTER — Telehealth: Payer: Self-pay | Admitting: Orthopedic Surgery

## 2021-11-18 DIAGNOSIS — M5410 Radiculopathy, site unspecified: Secondary | ICD-10-CM

## 2021-11-18 DIAGNOSIS — M533 Sacrococcygeal disorders, not elsewhere classified: Secondary | ICD-10-CM

## 2021-11-21 NOTE — Telephone Encounter (Signed)
Patient states he was told they needed to be done within one month, I told him I thought they were good for 6 weeks, but will defer to the scheduler with radiology and have him repeat the labs/ order at front desk he will pick up and go for lab.

## 2021-11-21 NOTE — Telephone Encounter (Signed)
Patient needs new lab order - states was unable to get the labs done 'the first time orders were given' - please advise regarding receiving new lab orders so that he may have them done, and results given, prior to imaging appointment 11/26/21 (CT and MRI) - please call by 1pm if possible at [phone].

## 2021-11-21 NOTE — Telephone Encounter (Signed)
He has already had the lab we needed done It is in epic and he is fine for the scan  I will call him today, but may be after 1 I am in clinic

## 2021-11-22 DIAGNOSIS — M5410 Radiculopathy, site unspecified: Secondary | ICD-10-CM | POA: Diagnosis not present

## 2021-11-22 DIAGNOSIS — M533 Sacrococcygeal disorders, not elsewhere classified: Secondary | ICD-10-CM | POA: Diagnosis not present

## 2021-11-23 LAB — BASIC METABOLIC PANEL
BUN: 15 mg/dL (ref 7–25)
CO2: 29 mmol/L (ref 20–32)
Calcium: 9.4 mg/dL (ref 8.6–10.3)
Chloride: 105 mmol/L (ref 98–110)
Creat: 1.23 mg/dL (ref 0.70–1.28)
Glucose, Bld: 86 mg/dL (ref 65–99)
Potassium: 4.4 mmol/L (ref 3.5–5.3)
Sodium: 139 mmol/L (ref 135–146)

## 2021-11-26 ENCOUNTER — Other Ambulatory Visit: Payer: Self-pay

## 2021-11-26 ENCOUNTER — Ambulatory Visit (HOSPITAL_COMMUNITY)
Admission: RE | Admit: 2021-11-26 | Discharge: 2021-11-26 | Disposition: A | Discharge status: Home / Self Care | Payer: Medicare HMO | Source: Ambulatory Visit | Attending: Orthopedic Surgery | Admitting: Orthopedic Surgery

## 2021-11-26 DIAGNOSIS — N400 Enlarged prostate without lower urinary tract symptoms: Secondary | ICD-10-CM | POA: Insufficient documentation

## 2021-11-26 DIAGNOSIS — M5136 Other intervertebral disc degeneration, lumbar region: Secondary | ICD-10-CM | POA: Insufficient documentation

## 2021-11-26 DIAGNOSIS — M5410 Radiculopathy, site unspecified: Secondary | ICD-10-CM | POA: Insufficient documentation

## 2021-11-26 DIAGNOSIS — M533 Sacrococcygeal disorders, not elsewhere classified: Secondary | ICD-10-CM

## 2021-11-26 DIAGNOSIS — M47817 Spondylosis without myelopathy or radiculopathy, lumbosacral region: Secondary | ICD-10-CM | POA: Insufficient documentation

## 2021-11-26 DIAGNOSIS — M48061 Spinal stenosis, lumbar region without neurogenic claudication: Secondary | ICD-10-CM | POA: Diagnosis not present

## 2021-11-26 DIAGNOSIS — M545 Low back pain, unspecified: Secondary | ICD-10-CM | POA: Diagnosis not present

## 2021-11-26 DIAGNOSIS — M47816 Spondylosis without myelopathy or radiculopathy, lumbar region: Secondary | ICD-10-CM | POA: Diagnosis not present

## 2021-11-26 DIAGNOSIS — C61 Malignant neoplasm of prostate: Secondary | ICD-10-CM | POA: Diagnosis not present

## 2021-11-26 MED ORDER — IOHEXOL 300 MG/ML  SOLN
100.0000 mL | Freq: Once | INTRAMUSCULAR | Status: AC | PRN
  Administered 2021-11-26: 100 mL via INTRAVENOUS

## 2021-11-26 MED ORDER — GADOBUTROL 1 MMOL/ML IV SOLN
10.0000 mL | Freq: Once | INTRAVENOUS | Status: AC | PRN
  Administered 2021-11-26: 10 mL via INTRAVENOUS

## 2021-12-02 ENCOUNTER — Encounter: Payer: Self-pay | Admitting: Orthopedic Surgery

## 2021-12-02 ENCOUNTER — Other Ambulatory Visit: Payer: Self-pay

## 2021-12-02 ENCOUNTER — Ambulatory Visit (INDEPENDENT_AMBULATORY_CARE_PROVIDER_SITE_OTHER): Payer: Medicare HMO | Admitting: Orthopedic Surgery

## 2021-12-02 DIAGNOSIS — M48061 Spinal stenosis, lumbar region without neurogenic claudication: Secondary | ICD-10-CM | POA: Diagnosis not present

## 2021-12-02 MED ORDER — METHOCARBAMOL 500 MG PO TABS: 500.0000 mg | ORAL_TABLET | Freq: Three times a day (TID) | ORAL | 1 refills | Status: DC

## 2021-12-02 NOTE — Progress Notes (Signed)
Chief Complaint  Patient presents with   Results    Review MRI and CT scans     Luis Stewart comes back in with his wife  He is a 70 year old male who has had some positive biopsies though low numbers of them related to his prostate gland  He also had some high PSA levels.  He is followed by Dr. Diona Fanti  His number is [phone] [facility]  He last saw him about October of this year  In any event he came to [place] with back pain and signs of spinal stenosis and on x-ray was found to have a cavity like lesion in his sacrum.  This was evaluated by CT and then MRI was done to evaluate his lumbar spine  One of his studies was read as possible metastatic disease although there was no surrounding lymphadenopathy  His CT scan was more consistent with Paget's disease.  I talked to the radiologist who read the CT scan and he said this lesion has been stable over the years he has had several scans over the years with no progression   I looked at both of the studies  On this CAT scan I agree that this is a stable appearing lesion  On the MRI he has significant spinal stenosis   I explained this to him and his wife and we talked about his back pain  He is had that for several years  His pain seems to be 8 out of 10 at its worst 3 out of 10 and tolerable most of the time  The pain in his lower back and radiates down his legs and then he has had chronic numbness in his feet which may be related but seems like it is a separate issue  He is already had physical therapy which did help and he says he is not really doing those exercises as much as he should  He notes that when he is bending a lot he does get relief of his pain  Some of the pain changes depending on the surfaces as he indicates that on asphalt he is fine but at [facility] he is not.  In any event I decided to put him on some medication told him to do his exercises more diligently.  We offered more physical therapy but he is  already done that  We also offered possible injections but he would like to try the medication first  We will put him on some Robaxin  I will call Dr. Diona Fanti to see if he can review the studies and tell us if anything else needs to be done Meds ordered this encounter  Medications   methocarbamol (ROBAXIN) 500 MG tablet    Sig: Take 1 tablet (500 mg total) by mouth 3 (three) times daily.    Dispense:  60 tablet    Refill:  1     Report #1 CT scan  Musculoskeletal: No suspicious bone lesions identified. There is an expansile, heterogeneously trabeculated appearance of the sacrum with thickening of the cortex (series 2, image 21, series 5, image 60), this general appearance unchanged in comparison to multiple prior examinations dating back as far as 07/28/2002   IMPRESSION: 1. 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 2. Extreme prostatomegaly, evaluated by prior dedicated MR of  the prostate. 3. No evidence of pelvic lymphadenopathy or other findings to suggest metastatic disease.     Electronically Signed   By: Delanna Ahmadi M.D.   On: 11/27/2021 08:47   Report #2  Disc levels:   L1-L2: Normal disc space and facet joints. No spinal canal stenosis. No neural foraminal stenosis.   L2-L3: Normal disc space and facet joints. No spinal canal stenosis. No neural foraminal stenosis.   L3-L4: Normal disc space and facet joints. No spinal canal stenosis. No neural foraminal stenosis.   L4-L5: Small disc bulge. Narrowing of both lateral recesses without central spinal canal stenosis. No neural foraminal stenosis.   L5-S1: Severe facet hypertrophy. Small disc bulge. Severe spinal canal stenosis. Severe bilateral neural  foraminal stenosis.   Visualized sacrum: Normal.   IMPRESSION: 1. Expanded appearance of the sacrum with diffusely abnormal bone marrow signal and contrast enhancement, concerning for metastatic disease. 2. Severe spinal canal stenosis and bilateral neural foraminal stenosis at L5-S1 due to combination of disc bulge and severe facet arthrosis. 3. Narrowing of both lateral recesses at L4-L5 due to combination of disc bulge and facet arthrosis.     Electronically Signed   By: Ulyses Jarred M.D.   On: 11/27/2021 00:46

## 2021-12-03 ENCOUNTER — Telehealth: Payer: Self-pay | Admitting: Orthopedic Surgery

## 2021-12-03 NOTE — Telephone Encounter (Signed)
Call placed to dr dahlstadt to discuss mri "metastatic" lesion    MRI IMPRESSION: 1. Expanded appearance of the sacrum with diffusely abnormal bone marrow signal and contrast enhancement, concerning for metastatic disease.  HAD TO LEAVE A MESSAGE AT [facility] OFFICE [phone]

## 2021-12-24 ENCOUNTER — Ambulatory Visit (INDEPENDENT_AMBULATORY_CARE_PROVIDER_SITE_OTHER): Payer: Worker's Compensation | Admitting: Internal Medicine

## 2021-12-24 ENCOUNTER — Other Ambulatory Visit: Payer: Self-pay

## 2021-12-24 ENCOUNTER — Ambulatory Visit (INDEPENDENT_AMBULATORY_CARE_PROVIDER_SITE_OTHER): Payer: Worker's Compensation

## 2021-12-24 ENCOUNTER — Encounter: Payer: Self-pay | Admitting: Internal Medicine

## 2021-12-24 DIAGNOSIS — R918 Other nonspecific abnormal finding of lung field: Secondary | ICD-10-CM | POA: Diagnosis not present

## 2021-12-24 DIAGNOSIS — Z77090 Contact with and (suspected) exposure to asbestos: Secondary | ICD-10-CM

## 2021-12-24 DIAGNOSIS — R0789 Other chest pain: Secondary | ICD-10-CM

## 2021-12-24 LAB — PULMONARY FUNCTION TEST
DL/VA % pred: 112 %
DL/VA: 4.46 ml/min/mmHg/L
DLCO cor % pred: 88 %
DLCO cor: 25.61 ml/min/mmHg
DLCO unc % pred: 88 %
DLCO unc: 25.61 ml/min/mmHg
FEF 25-75 Post: 3.38 L/sec
FEF 25-75 Pre: 2.95 L/sec
FEF2575-%Change-Post: 14 %
FEF2575-%Pred-Post: 119 %
FEF2575-%Pred-Pre: 104 %
FEV1-%Change-Post: 2 %
FEV1-%Pred-Post: 91 %
FEV1-%Pred-Pre: 89 %
FEV1-Post: 3.08 L
FEV1-Pre: 3 L
FEV1FVC-%Change-Post: 3 %
FEV1FVC-%Pred-Pre: 105 %
FEV6-%Change-Post: -1 %
FEV6-%Pred-Post: 86 %
FEV6-%Pred-Pre: 87 %
FEV6-Post: 3.67 L
FEV6-Pre: 3.72 L
FEV6FVC-%Change-Post: 0 %
FEV6FVC-%Pred-Post: 103 %
FEV6FVC-%Pred-Pre: 103 %
FVC-%Change-Post: -1 %
FVC-%Pred-Post: 83 %
FVC-%Pred-Pre: 84 %
FVC-Post: 3.7 L
FVC-Pre: 3.74 L
Post FEV1/FVC ratio: 83 %
Post FEV6/FVC ratio: 99 %
Pre FEV1/FVC ratio: 80 %
Pre FEV6/FVC Ratio: 99 %
RV % pred: 92 %
RV: 2.49 L
TLC % pred: 81 %
TLC: 6.39 L

## 2021-12-24 NOTE — Progress Notes (Signed)
Subjective:   Patient ID: Luis Stewart, male    DOB: 24-Jan-1951    MRN: [id number]    Brief patient profile:  69 yobm from [place]  never smoker worked for [employer] until 2003 last exposed abestos around 1980 with "abn" cxr 2014 referred by Luis Luis Stewart to pulmonary clinic 08/28/2015 with rec from [place] [facility] to get pfts yearly but no evidence of any asbestosis related dz on CT as recently as  10/25/2018    History of Present Illness  08/28/2015 1st [facility] office visit/ Luis Stewart   Chief Complaint  Patient presents with   Pulmonary Consult    Referred by Luis. Vic Stewart. Pt has hx of asbestos exposure. He states that he is overall doing well. He does have some prod cough with clear to yellow sputum.    eval by Luis Stewart with ? increased int markings but no f/u ct to date  Am cough x decades/ sensation of pnds / has not tried otc's rec Please see patient coordinator before you leave today  to schedule HRCT chest and return here same day with pfts on return  Try zyrtec 10 mg at bedtime to see if your daily am cough is better if now I recommend For drainage take chlortrimeton (chlorpheniramine) 4 mg every 4 hours available over the counter (may cause drowsiness)    09/27/2015  f/u ov/Luis Stewart re: asbestos f/u  Chief Complaint  Patient presents with   Follow-up    Hx of asbestos exposure. Pt states that hsi breathing is doing well. Pt c/o of occasional wet cough, yellow to clear mucus. Pt denies wheeze/CP/SOB.    Not limited by breathing from desired activities  rec Try zyrtec 10 mg at bedtime to see if your daily am cough is better if not I recommend For drainage take chlortrimeton (chlorpheniramine) 4 mg every 4 hours available over the counter (may cause drowsiness)    10/20/2016  f/u ov/Luis Stewart re: asbestos f/u  Chief Complaint  Patient presents with   Follow-up    PFT's and cxr done today. He c/o soreness in his chest for the past year. He has had some PND and  sore throat for the past day.   not using otc's as rec "too sleepy" but actually did work  rec Return in one year for pfts and cxr  - call for earlier follow up for cough or breathing issues  Try clariton or allergra as needed for drainage but try not to clear throat  GERD diet    10/20/2017  f/u ov/Luis Stewart re: asbestos f/u  Chief Complaint  Patient presents with   Follow-up    PFT today prior to visit. Pt has productive cough-clear. Pt has SOB, wheezing with cough. Pt had knee replacement in June 2018 had PE afterwards.  really not limited by breathing and R knee doing well.  No noct cough/ wheeze/ only sob if overdoes it Off anticoagulants since Sep 21 2017 Rec Protonix 40 mg be sure to Take 30-60 min before first meal of the day  GERD  Diet    10/25/2018  f/u ov/Luis Stewart re: asbestos exp last around 1980  Chief Complaint  Patient presents with   Follow-up    PFT's done today. Breathing is overall doing well. He is coughing less.   Dyspnea:  Not limited by breathing from desired activities  But no aerobics Cough: none Sleeping: on side/ bed flat SABA use: none 02: none  Recurrent cp 2015 neg cardiac eval  in past / started back up 2018 shortest x one hour up to a few days mostly day time  Anterior L of sternum not exertional or pleuritic?  relieved by flatus?  For cardiac CT 10/29/18 > neg for CAD/neg for asbestosis related lung/ pleural dz    10/29/2020  f/u ov/Luis Stewart re:  Yearly surveillance for asbestos/ no symptoms Chief Complaint  Patient presents with   Follow-up    PFT's done today.  Breathing is overall doing well and no new co's.    Dyspnea:   Not limited by breathing from desired activities / no aerobics   Cough: minimal pnds clears quickly each am  Sleeping: flat /no resp symptoms SABA use: none 02: none  Rec Stay as active physically as possible     12/24/2021  f/u ov/Luis Stewart re: asbestos exp  - no pulmonary rx  Chief Complaint  Patient presents with   Follow-up     PFT performed today. Pt states he has been doing okay since last visit. States that he was diagnosed with Covid 08/2021 but is doing better now.  Dyspnea:  not limited on exertion  Cough: occ pnds  Sleeping: bed is flat/ 3 pillows  SABA use: none  02: none Covid status: vax  x 3  Cp x years / not worse with ex or deep breath / neg prior rx     No obvious day to day or daytime variability or assoc excess/ purulent sputum or mucus plugs or hemoptysis or  chest tightness, subjective wheeze or overt sinus or hb symptoms.   Sleeing  without nocturnal  or early am exacerbation  of respiratory  c/o's or need for noct saba. Also denies any obvious fluctuation of symptoms with weather or environmental changes or other aggravating or alleviating factors except as outlined above   No unusual exposure hx or h/o childhood pna/ asthma or knowledge of premature birth.  Current Allergies, Complete Past Medical History, Past Surgical History, Family History, and Social History were reviewed in [employer] record.  ROS  The following are not active complaints unless bolded Hoarseness, sore throat, dysphagia, dental problems, itching, sneezing,  nasal congestion or discharge of excess mucus or purulent secretions, ear ache,   fever, chills, sweats, unintended wt loss or wt gain, classically pleuritic or exertional cp,  orthopnea pnd or arm/hand swelling  or leg swelling, presyncope, palpitations, abdominal pain, anorexia, nausea, vomiting, diarrhea  or change in bowel habits or change in bladder habits, change in stools or change in urine, dysuria, hematuria,  rash, arthralgias, visual complaints, headache, numbness, weakness or ataxia or problems with walking or coordination,  change in mood or  memory.        Current Meds  Medication Sig   acetaminophen (TYLENOL) 500 MG tablet Take 1,000 mg by mouth every 6 (six) hours as needed for mild pain or moderate pain.   benzonatate (TESSALON)  100 MG capsule Take 1 capsule (100 mg total) by mouth 2 (two) times daily as needed for cough.   Cholecalciferol (VITAMIN D) 2000 units CAPS Take 2,000 Units by mouth 3 (three) times a week.    methocarbamol (ROBAXIN) 500 MG tablet Take 1 tablet (500 mg total) by mouth 3 (three) times daily.   Multiple Vitamin (MULTIVITAMIN WITH MINERALS) TABS tablet Take 1 tablet by mouth daily. Centrum Silver for Men   nitroGLYCERIN (NITROSTAT) 0.4 MG SL tablet Place 1 tablet (0.4 mg total) under the tongue every 5 (five) minutes as needed for  chest pain.   pantoprazole (PROTONIX) 40 MG tablet Take 40 mg by mouth every other day.                            Objective:   Physical Exam   12/24/2021        181    10/29/20 187 lb 11.2 oz (85.1 kg)  04/18/20 190 lb (86.2 kg)  04/13/20 190 lb (86.2 kg)     Vital signs reviewed  12/24/2021  - Note at rest 02 sats  97% on RA   General appearance:    amb bm nad     HEENT : pt wearing mask not removed for exam due to covid -19 concerns.    NECK :  without JVD/Nodes/TM/ nl carotid upstrokes bilaterally   LUNGS: no acc muscle use,  Nl contour chest which is clear to A and P bilaterally without cough on insp or exp maneuvers   CV:  RRR  no s3 or murmur or increase in P2, and no edema   ABD:  soft and nontender with nl inspiratory excursion in the supine position. No bruits or organomegaly appreciated, bowel sounds nl  MS:  Nl gait/ ext warm without deformities, calf tenderness, cyanosis or clubbing No obvious joint restrictions   SKIN: warm and dry without lesions    NEURO:  alert, approp, nl sensorium with  no motor or cerebellar deficits apparent.    CXR PA and Lateral:   12/24/2021 :    I personally reviewed images and agree with radiology impression as follows:      No active cardiopulmonary disease.      Assessment & Plan:

## 2021-12-24 NOTE — Patient Instructions (Addendum)
Protonix should be Take 30-60 min before first meal of the day for at least a month to see what effects it has on your chest pain   GERD (REFLUX)  is an extremely common cause of respiratory symptoms just like yours , many times with no obvious heartburn at all.    It can be treated with medication, but also with lifestyle changes including elevation of the head of your bed (ideally with 6 -8inch blocks under the headboard of your bed),  Smoking cessation, avoidance of late meals, excessive alcohol, and avoid fatty foods, chocolate, peppermint, colas, red wine, and acidic juices such as orange juice.  NO MINT OR MENTHOL PRODUCTS SO NO COUGH DROPS  USE SUGARLESS CANDY INSTEAD (Jolley ranchers or Stover's or Life Savers) or even ice chips will also do - the key is to swallow to prevent all throat clearing. NO OIL BASED VITAMINS - use powdered substitutes.  Avoid fish oil when coughing.   Please remember to go to the  x-ray department  for your tests - we will call you with the results when they are available

## 2021-12-24 NOTE — Progress Notes (Signed)
PFT Done today. 

## 2021-12-25 ENCOUNTER — Encounter: Payer: Self-pay | Admitting: Internal Medicine

## 2021-12-25 NOTE — Assessment & Plan Note (Signed)
rec rx as IBS 10/25/2018  - neg cards w/u 2019 - no change 12/24/2021 s pleuritic or ex cp so rec at least 4 weeks max gerd rx

## 2021-12-25 NOTE — Assessment & Plan Note (Signed)
Last significant exposure probably 1980/ never smoker HRCT   09/27/15  1. No evidence of asbestos related pleural disease. 2. No evidence of interstitial lung disease. Specifically, no significant regions of subpleural reticulation, ground-glass attenuation, traction bronchiectasis or frank honeycombing. 3. Mild air trapping, suggestive of nonspecific mild small airway disease. 4. Solitary 4 mm right middle lobe pulmonary nodule with a benign Appearance. - PFT's 09/27/2015 wnl with FVC  4.33  97/96%  > 121 %  Corrected for alv vol  - PFTs  10/20/2017  wn[facility]     FVC   3.87(85%)  With dlco 83/92c and corrects to 124% for alv vol - CTa 10/06/17 neg for asbestos changes  - PFT's  10/25/2018  FVC  3.97 (88 % ) ratio nl  p no % improvement from saba p nothing prior to study with DLCO  82 % corrects to 119  % for alv volume   - CT chest 10/29/18 >>> No suspicious pulmonary nodule or mass within the visualized lung parenchyma. No visualized pleural effusion. No worrisome or suspicious osseous abnormality.  PFT's  10/28/2019  FVC 4.06 (90 % ) ratio 0.83  with DLCO  30.66 (104%) corrects to 5.23 (130%)   PFT's  10/29/2020  FVC  3.84 with DLCO  28.70 (98%) corrects to 4.92 (122%)  for alv volume  -PFT's  12/24/2021  FVC 3.74  with DLCO  25.61(88%) corrects to 4.46 (111%)  for alv volume and FV curve nl  No clinical or radiographic evidence for asbestos dz of any nature now 49 y out from exposure so f/u can be in [place] yearly going forward          Each maintenance medication was reviewed in detail including emphasizing most importantly the difference between maintenance and prns and under what circumstances the prns are to be triggered using an action plan format where appropriate.  Total time for H and P, chart review, counseling,  and generating customized AVS unique to this office visit / same day charting = 21 min

## 2021-12-26 DIAGNOSIS — N529 Male erectile dysfunction, unspecified: Secondary | ICD-10-CM | POA: Diagnosis not present

## 2021-12-26 DIAGNOSIS — K219 Gastro-esophageal reflux disease without esophagitis: Secondary | ICD-10-CM | POA: Diagnosis not present

## 2021-12-26 DIAGNOSIS — G8929 Other chronic pain: Secondary | ICD-10-CM | POA: Diagnosis not present

## 2021-12-26 DIAGNOSIS — C61 Malignant neoplasm of prostate: Secondary | ICD-10-CM | POA: Diagnosis not present

## 2021-12-26 DIAGNOSIS — Z008 Encounter for other general examination: Secondary | ICD-10-CM | POA: Diagnosis not present

## 2021-12-26 DIAGNOSIS — Z8249 Family history of ischemic heart disease and other diseases of the circulatory system: Secondary | ICD-10-CM | POA: Diagnosis not present

## 2021-12-26 DIAGNOSIS — Z833 Family history of diabetes mellitus: Secondary | ICD-10-CM | POA: Diagnosis not present

## 2021-12-26 DIAGNOSIS — M1990 Unspecified osteoarthritis, unspecified site: Secondary | ICD-10-CM | POA: Diagnosis not present

## 2021-12-26 DIAGNOSIS — Z809 Family history of malignant neoplasm, unspecified: Secondary | ICD-10-CM | POA: Diagnosis not present

## 2021-12-26 DIAGNOSIS — Z882 Allergy status to sulfonamides status: Secondary | ICD-10-CM | POA: Diagnosis not present

## 2022-01-31 ENCOUNTER — Emergency Department (HOSPITAL_COMMUNITY)
Admission: EM | Admit: 2022-01-31 | Discharge: 2022-01-31 | Disposition: A | Discharge status: Home / Self Care | Payer: Medicare HMO | Attending: Emergency Medicine | Admitting: Emergency Medicine

## 2022-01-31 ENCOUNTER — Other Ambulatory Visit: Payer: Self-pay

## 2022-01-31 ENCOUNTER — Emergency Department (HOSPITAL_COMMUNITY)
Admission: RE | Admit: 2022-01-31 | Discharge: 2022-01-31 | Disposition: A | Discharge status: Home / Self Care | Payer: Medicare HMO | Source: Ambulatory Visit | Attending: Emergency Medicine | Admitting: Emergency Medicine

## 2022-01-31 ENCOUNTER — Encounter (HOSPITAL_COMMUNITY): Payer: Self-pay | Admitting: Emergency Medicine

## 2022-01-31 DIAGNOSIS — Z8546 Personal history of malignant neoplasm of prostate: Secondary | ICD-10-CM | POA: Insufficient documentation

## 2022-01-31 DIAGNOSIS — M549 Dorsalgia, unspecified: Secondary | ICD-10-CM | POA: Diagnosis not present

## 2022-01-31 DIAGNOSIS — Z96651 Presence of right artificial knee joint: Secondary | ICD-10-CM | POA: Diagnosis not present

## 2022-01-31 DIAGNOSIS — M79605 Pain in left leg: Secondary | ICD-10-CM | POA: Diagnosis not present

## 2022-01-31 NOTE — Telephone Encounter (Signed)
Pt has new PCP

## 2022-01-31 NOTE — Discharge Instructions (Signed)
You were evaluated in the Emergency Department and after careful evaluation, we did not find any emergent condition requiring admission or further testing in the hospital.  Your exam/testing today was overall reassuring.  Recommend returning to [facility] emergency department for ultrasound to look for DVT.  Please return to the Emergency Department if you experience any worsening of your condition.  Thank you for allowing [place] to be a part of your care.

## 2022-01-31 NOTE — ED Triage Notes (Signed)
Pt c/o left leg pain that goes from his hip down to his ankle. States pain started last night. Pt concerned for DVT's about 5 years ago.

## 2022-01-31 NOTE — ED Provider Notes (Signed)
[facility] Emergency Department Provider Note MRN:  [id number]  Arrival date & time: 01/31/22     Chief Complaint   Leg Pain   History of Present Illness   Khambrel Amsden is a 71 y.o. year-old male with a history of DVT presenting to the ED with chief complaint of leg pain.  Pain to the left leg for the past few days.  Denies trauma.  Has also been having some intermittent back pain.  Pain sometimes radiates from the back down into the left leg.  Denies bowel or bladder dysfunction, no numbness or weakness to the arms or legs.  No fever.  No abdominal pain, no other complaints.  Pain feels similar to prior DVT.  Review of Systems  A thorough review of systems was obtained and all systems are negative except as noted in the HPI and PMH.   Patient's Health History    Past Medical History:  Diagnosis Date   Arthritis    Asbestosis ([place])    Cyst of kidney, acquired    Headache    History of fracture of leg    Right, childhood   Hx of migraine headaches    Hyperlipidemia    Neuropathy    PE (pulmonary thromboembolism) ([place])    After knee surgery   Prostate cancer ([place]) 2015   Adenocarcinoma by biopsy    Past Surgical History:  Procedure Laterality Date   BIOPSY PROSTATE  2003 and 2005   COLONOSCOPY  11/28/2008   Dr. Rourk:internal hemorrhoids/tortus colon/ascending colon polyps, tubular adenomas   COLONOSCOPY N/A 09/04/2014   Procedure: COLONOSCOPY;  Surgeon: Daneil Dolin, MD;  [facility];  Service: Endoscopy;  Laterality: N/A;  9:30   COLONOSCOPY N/A 07/17/2017   Procedure: COLONOSCOPY;  Surgeon: Danie Binder, MD;  [facility];  Service: Endoscopy;  Laterality: N/A;   ESOPHAGOGASTRODUODENOSCOPY N/A 07/16/2017   Procedure: ESOPHAGOGASTRODUODENOSCOPY (EGD);  Surgeon: Danie Binder, MD;  [facility];  Service: Endoscopy;  Laterality: N/A;   GIVENS CAPSULE STUDY  07/16/2017   Procedure: GIVENS  CAPSULE STUDY;  Surgeon: Danie Binder, MD;  [facility];  Service: Endoscopy;;   JOINT REPLACEMENT N/A    Phreesia 01/12/2021   POLYPECTOMY  07/17/2017   Procedure: POLYPECTOMY;  Surgeon: Danie Binder, MD;  [facility];  Service: Endoscopy;;  cecal   TOTAL KNEE ARTHROPLASTY Right 06/10/2017   TOTAL KNEE ARTHROPLASTY Right 06/10/2017   Procedure: TOTAL KNEE ARTHROPLASTY;  Surgeon: [name], MD;  Location: [facility];  Service: Orthopedics;  Laterality: Right;    Family History  Problem Relation Age of Onset   Diabetes Mother    Hypertension Mother    Obesity Mother    Heart disease Mother    Mental illness Mother    Hypertension Sister    Obesity Sister    Arthritis Sister    Deep vein thrombosis Father    Dementia Father    Heart disease Father        CABG   Colon cancer Neg Hx     Social History   Socioeconomic History   Marital status: Married    Spouse name: Not on file   Number of children: Not on file   Years of education: Not on file   Highest education level: Not on file  Occupational History   Occupation: Retired  Tobacco Use   Smoking status: Never   Smokeless tobacco: Never  Vaping Use   Vaping Use: Never used  Substance and Sexual Activity   Alcohol use: No   Drug use: No   Sexual activity: Yes  Other Topics Concern   Not on file  Social History Narrative   Not on file   Social Determinants of Health   Financial Resource Strain: Not on file  Food Insecurity: Not on file  Transportation Needs: Not on file  Physical Activity: Not on file  Stress: Not on file  Social Connections: Not on file  Intimate Partner Violence: Not on file     Physical Exam   Vitals:   01/31/22 0415  BP: 130/73  Pulse: 79  Resp: 17  Temp: 98.6 F (37 C)  SpO2: 98%    CONSTITUTIONAL: Well-appearing, NAD NEURO/PSYCH:  Alert and oriented x 3, no focal deficits EYES:  eyes equal and reactive ENT/NECK:  no LAD, no JVD CARDIO: Regular  rate, well-perfused, normal S1 and S2 PULM:  CTAB no wheezing or rhonchi GI/GU:  non-distended, non-tender MSK/SPINE:  No gross deformities, no edema SKIN:  no rash, atraumatic   *Additional and/or pertinent findings included in MDM below  Diagnostic and Interventional Summary    EKG Interpretation  Date/Time:    Ventricular Rate:    PR Interval:    QRS Duration:   QT Interval:    QTC Calculation:   R Axis:     Text Interpretation:         Labs Reviewed - No data to display  US Venous Img Lower Unilateral Left    (Results Pending)    Medications - No data to display   Procedures  /  Critical Care Procedures  ED Course and Medical Decision Making  Initial Impression and Ddx Leg overall appears normal, neurovascularly intact.  No erythema, no increased warmth.  Overall low concern for DVT however given patient's history of DVT and per imaging review history of likely metastatic cancer, difficult to exclude DVT without ultrasound.  Other considerations include MSK pain, sciatica.  Nothing to suggest infection.  Vital signs normal, no other symptoms.  Discussed options with patient, rather than wait here for the ultrasound technician will return later today.  Past medical/surgical history that increases complexity of ED encounter: History of VTE  Interpretation of Diagnostics Not applicable  Patient Reassessment and Ultimate Disposition/Management Discharge home, will return for imaging.  Patient management required discussion with the following services or consulting groups:  None  Complexity of Problems Addressed Acute illness or injury that poses threat of life of bodily function  Additional Data Reviewed and Analyzed Further history obtained from: Further history from spouse/family member  Factors Impacting ED Encounter Risk Prescriptions  Barth Kirks. Sedonia Small, MD [place] [email or web address]  Final Clinical  Impressions(s) / ED Diagnoses     ICD-10-CM   1. Left leg pain  M79.605       ED Discharge Orders          Ordered    US Venous Img Lower Unilateral Left        01/31/22 0429             Discharge Instructions Discussed with and Provided to Patient:    Discharge Instructions      You were evaluated in the Emergency Department and after careful evaluation, we did not find any emergent condition requiring admission or further testing in the hospital.  Your exam/testing today was overall reassuring.  Recommend returning to [facility]  emergency department for ultrasound to look for DVT.  Please return to the Emergency Department if you experience any worsening of your condition.  Thank you for allowing [place] to be a part of your care.       Maudie Flakes, MD 01/31/22 [phone]

## 2022-02-03 ENCOUNTER — Other Ambulatory Visit: Payer: Self-pay

## 2022-02-03 ENCOUNTER — Ambulatory Visit (INDEPENDENT_AMBULATORY_CARE_PROVIDER_SITE_OTHER): Payer: Medicare HMO | Admitting: *Deleted

## 2022-02-03 ENCOUNTER — Other Ambulatory Visit: Payer: Self-pay | Admitting: *Deleted

## 2022-02-03 DIAGNOSIS — Z0000 Encounter for general adult medical examination without abnormal findings: Secondary | ICD-10-CM | POA: Diagnosis not present

## 2022-02-03 MED ORDER — PANTOPRAZOLE SODIUM 40 MG PO TBEC: 40.0000 mg | DELAYED_RELEASE_TABLET | ORAL | 0 refills | Status: DC

## 2022-02-03 NOTE — Patient Instructions (Signed)
[name] , Thank you for taking time to come for your Medicare Wellness Visit. I appreciate your ongoing commitment to your health goals. Please review the following plan we discussed and let me know if I can assist you in the future.   Screening recommendations/referrals: Colonoscopy: completed Recommended yearly ophthalmology/optometry visit for glaucoma screening and checkup Recommended yearly dental visit for hygiene and checkup  Vaccinations: Influenza vaccine: completed Pneumococcal vaccine: completed Tdap vaccine: completed Shingles vaccine: due now     Advanced directives: information provided  Next appointment: 1 year   Preventive Care 36 Years and Older, Male Preventive care refers to lifestyle choices and visits with your health care provider that can promote health and wellness. What does preventive care include? A yearly physical exam. This is also called an annual well check. Dental exams once or twice a year. Routine eye exams. Ask your health care provider how often you should have your eyes checked. Personal lifestyle choices, including: Daily care of your teeth and gums. Regular physical activity. Eating a healthy diet. Avoiding tobacco and drug use. Limiting alcohol use. Practicing safe sex. Taking low doses of aspirin every day. Taking vitamin and mineral supplements as recommended by your health care provider. What happens during an annual well check? The services and screenings done by your health care provider during your annual well check will depend on your age, overall health, lifestyle risk factors, and family history of disease. Counseling  Your health care provider may ask you questions about your: Alcohol use. Tobacco use. Drug use. Emotional well-being. Home and relationship well-being. Sexual activity. Eating habits. History of falls. Memory and ability to understand (cognition). Work and work Statistician. Screening  You may have  the following tests or measurements: Height, weight, and BMI. Blood pressure. Lipid and cholesterol levels. These may be checked every 5 years, or more frequently if you are over 50 years old. Skin check. Lung cancer screening. You may have this screening every year starting at age 84 if you have a 30-pack-year history of smoking and currently smoke or have quit within the past 15 years. Fecal occult blood test (FOBT) of the stool. You may have this test every year starting at age 44. Flexible sigmoidoscopy or colonoscopy. You may have a sigmoidoscopy every 5 years or a colonoscopy every 10 years starting at age 53. Prostate cancer screening. Recommendations will vary depending on your family history and other risks. Hepatitis C blood test. Hepatitis B blood test. Sexually transmitted disease (STD) testing. Diabetes screening. This is done by checking your blood sugar (glucose) after you have not eaten for a while (fasting). You may have this done every 1-3 years. Abdominal aortic aneurysm (AAA) screening. You may need this if you are a current or former smoker. Osteoporosis. You may be screened starting at age 86 if you are at high risk. Talk with your health care provider about your test results, treatment options, and if necessary, the need for more tests. Vaccines  Your health care provider may recommend certain vaccines, such as: Influenza vaccine. This is recommended every year. Tetanus, diphtheria, and acellular pertussis (Tdap, Td) vaccine. You may need a Td booster every 10 years. Zoster vaccine. You may need this after age 26. Pneumococcal 13-valent conjugate (PCV13) vaccine. One dose is recommended after age 30. Pneumococcal polysaccharide (PPSV23) vaccine. One dose is recommended after age 107. Talk to your health care provider about which screenings and vaccines you need and how often you need them. This information is not  intended to replace advice given to you by your health  care provider. Make sure you discuss any questions you have with your health care provider. Document Released: 01/04/2016 Document Revised: 08/27/2016 Document Reviewed: 10/09/2015 Elsevier Interactive Patient Education  2017 [place] Prevention in the Home Falls can cause injuries. They can happen to people of all ages. There are many things you can do to make your home safe and to help prevent falls. What can I do on the outside of my home? Regularly fix the edges of walkways and driveways and fix any cracks. Remove anything that might make you trip as you walk through a door, such as a raised step or threshold. Trim any bushes or trees on the path to your home. Use bright outdoor lighting. Clear any walking paths of anything that might make someone trip, such as rocks or tools. Regularly check to see if handrails are loose or broken. Make sure that both sides of any steps have handrails. Any raised decks and porches should have guardrails on the edges. Have any leaves, snow, or ice cleared regularly. Use sand or salt on walking paths during winter. Clean up any spills in your garage right away. This includes oil or grease spills. What can I do in the bathroom? Use night lights. Install grab bars by the toilet and in the tub and shower. Do not use towel bars as grab bars. Use non-skid mats or decals in the tub or shower. If you need to sit down in the shower, use a plastic, non-slip stool. Keep the floor dry. Clean up any water that spills on the floor as soon as it happens. Remove soap buildup in the tub or shower regularly. Attach bath mats securely with double-sided non-slip rug tape. Do not have throw rugs and other things on the floor that can make you trip. What can I do in the bedroom? Use night lights. Make sure that you have a light by your bed that is easy to reach. Do not use any sheets or blankets that are too big for your bed. They should not hang down onto the  floor. Have a firm chair that has side arms. You can use this for support while you get dressed. Do not have throw rugs and other things on the floor that can make you trip. What can I do in the kitchen? Clean up any spills right away. Avoid walking on wet floors. Keep items that you use a lot in easy-to-reach places. If you need to reach something above you, use a strong step stool that has a grab bar. Keep electrical cords out of the way. Do not use floor polish or wax that makes floors slippery. If you must use wax, use non-skid floor wax. Do not have throw rugs and other things on the floor that can make you trip. What can I do with my stairs? Do not leave any items on the stairs. Make sure that there are handrails on both sides of the stairs and use them. Fix handrails that are broken or loose. Make sure that handrails are as long as the stairways. Check any carpeting to make sure that it is firmly attached to the stairs. Fix any carpet that is loose or worn. Avoid having throw rugs at the top or bottom of the stairs. If you do have throw rugs, attach them to the floor with carpet tape. Make sure that you have a light switch at the top of the stairs  and the bottom of the stairs. If you do not have them, ask someone to add them for you. What else can I do to help prevent falls? Wear shoes that: Do not have high heels. Have rubber bottoms. Are comfortable and fit you well. Are closed at the toe. Do not wear sandals. If you use a stepladder: Make sure that it is fully opened. Do not climb a closed stepladder. Make sure that both sides of the stepladder are locked into place. Ask someone to hold it for you, if possible. Clearly mark and make sure that you can see: Any grab bars or handrails. First and last steps. Where the edge of each step is. Use tools that help you move around (mobility aids) if they are needed. These include: Canes. Walkers. Scooters. Crutches. Turn on the  lights when you go into a dark area. Replace any light bulbs as soon as they burn out. Set up your furniture so you have a clear path. Avoid moving your furniture around. If any of your floors are uneven, fix them. If there are any pets around you, be aware of where they are. Review your medicines with your doctor. Some medicines can make you feel dizzy. This can increase your chance of falling. Ask your doctor what other things that you can do to help prevent falls. This information is not intended to replace advice given to you by your health care provider. Make sure you discuss any questions you have with your health care provider. Document Released: 10/04/2009 Document Revised: 05/15/2016 Document Reviewed: 01/12/2015 Elsevier Interactive Patient Education  2017 [employer].

## 2022-02-03 NOTE — Progress Notes (Signed)
Subjective:   Luis Stewart is a 71 y.o. male who presents for Medicare Annual/Subsequent preventive examination.  I connected with  Luis Stewart on 02/03/22 by a audio enabled telemedicine application and verified that I am speaking with the correct person using two identifiers.  Patient Location: Home  Provider Location: Home Office  I discussed the limitations of evaluation and management by telemedicine. The patient expressed understanding and agreed to proceed.  Review of Systems     Luis Stewart , Thank you for taking time to come for your Medicare Wellness Visit. I appreciate your ongoing commitment to your health goals. Please review the following plan we discussed and let me know if I can assist you in the future.   These are the goals we discussed:  Goals   None     This is a list of the screening recommended for you and due dates:  Health Maintenance  Topic Date Due   Zoster (Shingles) Vaccine (1 of 2) Never done   COVID-19 Vaccine (3 - Moderna risk series) 11/29/2020   Tetanus Vaccine  09/03/2022*   Colon Cancer Screening  07/18/2027   Pneumonia Vaccine  Completed   Flu Shot  Completed   Hepatitis C Screening: USPSTF Recommendation to screen - Ages 18-79 yo.  Completed   HPV Vaccine  Aged Out  *Topic was postponed. The date shown is not the original due date.          Objective:    There were no vitals filed for this visit. There is no height or weight on file to calculate BMI.  Advanced Directives 01/31/2022 06/26/2021 04/13/2020 11/16/2019 11/10/2018 08/14/2018 06/29/2018  Does Patient Have a Medical Advance Directive? No No No No No No No  Would patient like information on creating a medical advance directive? No - Patient declined No - Patient declined - Yes (MAU/Ambulatory/Procedural Areas - Information given) Yes (MAU/Ambulatory/Procedural Areas - Information given) No - Patient declined -    Current Medications  (verified) Outpatient Encounter Medications as of 02/03/2022  Medication Sig   acetaminophen (TYLENOL) 500 MG tablet Take 1,000 mg by mouth every 6 (six) hours as needed for mild pain or moderate pain.   benzonatate (TESSALON) 100 MG capsule Take 1 capsule (100 mg total) by mouth 2 (two) times daily as needed for cough.   Cholecalciferol (VITAMIN D) 2000 units CAPS Take 2,000 Units by mouth 3 (three) times a week.    methocarbamol (ROBAXIN) 500 MG tablet Take 1 tablet (500 mg total) by mouth 3 (three) times daily.   Multiple Vitamin (MULTIVITAMIN WITH MINERALS) TABS tablet Take 1 tablet by mouth daily. Centrum Silver for Men   nitroGLYCERIN (NITROSTAT) 0.4 MG SL tablet Place 1 tablet (0.4 mg total) under the tongue every 5 (five) minutes as needed for chest pain.   pantoprazole (PROTONIX) 40 MG tablet Take 40 mg by mouth every other day.   No facility-administered encounter medications on file as of 02/03/2022.    Allergies (verified) Sulfa antibiotics   History: Past Medical History:  Diagnosis Date   Arthritis    Asbestosis ([place])    Cyst of kidney, acquired    Headache    History of fracture of leg    Right, childhood   Hx of migraine headaches    Hyperlipidemia    Neuropathy    PE (pulmonary thromboembolism) ([place])    After knee surgery   Prostate cancer ([place]) 2015   Adenocarcinoma by biopsy   Past Surgical  History:  Procedure Laterality Date   BIOPSY PROSTATE  2003 and 2005   COLONOSCOPY  11/28/2008   Dr. Rourk:internal hemorrhoids/tortus colon/ascending colon polyps, tubular adenomas   COLONOSCOPY N/A 09/04/2014   Procedure: COLONOSCOPY;  Surgeon: Daneil Dolin, MD;  [facility];  Service: Endoscopy;  Laterality: N/A;  9:30   COLONOSCOPY N/A 07/17/2017   Procedure: COLONOSCOPY;  Surgeon: Danie Binder, MD;  [facility];  Service: Endoscopy;  Laterality: N/A;   ESOPHAGOGASTRODUODENOSCOPY N/A 07/16/2017   Procedure: ESOPHAGOGASTRODUODENOSCOPY (EGD);   Surgeon: Danie Binder, MD;  [facility];  Service: Endoscopy;  Laterality: N/A;   GIVENS CAPSULE STUDY  07/16/2017   Procedure: GIVENS CAPSULE STUDY;  Surgeon: Danie Binder, MD;  [facility];  Service: Endoscopy;;   JOINT REPLACEMENT N/A    Phreesia 01/12/2021   POLYPECTOMY  07/17/2017   Procedure: POLYPECTOMY;  Surgeon: Danie Binder, MD;  [facility];  Service: Endoscopy;;  cecal   TOTAL KNEE ARTHROPLASTY Right 06/10/2017   TOTAL KNEE ARTHROPLASTY Right 06/10/2017   Procedure: TOTAL KNEE ARTHROPLASTY;  Surgeon: [name], MD;  Location: [facility];  Service: Orthopedics;  Laterality: Right;   Family History  Problem Relation Age of Onset   Diabetes Mother    Hypertension Mother    Obesity Mother    Heart disease Mother    Mental illness Mother    Hypertension Sister    Obesity Sister    Arthritis Sister    Deep vein thrombosis Father    Dementia Father    Heart disease Father        CABG   Colon cancer Neg Hx    Social History   Socioeconomic History   Marital status: Married    Spouse name: Not on file   Number of children: Not on file   Years of education: Not on file   Highest education level: Not on file  Occupational History   Occupation: Retired  Tobacco Use   Smoking status: Never   Smokeless tobacco: Never  Vaping Use   Vaping Use: Never used  Substance and Sexual Activity   Alcohol use: No   Drug use: No   Sexual activity: Yes  Other Topics Concern   Not on file  Social History Narrative   Not on file   Social Determinants of Health   Financial Resource Strain: Not on file  Food Insecurity: Not on file  Transportation Needs: Not on file  Physical Activity: Not on file  Stress: Not on file  Social Connections: Not on file    Tobacco Counseling Counseling given: Not Answered   Clinical Intake:                 Diabetic?no         Activities of Daily Living No flowsheet data  found.  Patient Care Team: Lindell Spar, MD as PCP - General (Internal Medicine) Satira Sark, MD as PCP - Cardiology (Cardiology) Gala Romney Cristopher Estimable, MD as Consulting Physician (Gastroenterology)  Indicate any recent Medical Services you may have received from other than [facility] providers in the past year (date may be approximate).     Assessment:   This is a routine wellness examination for Luis Stewart.  Hearing/Vision screen No results found.  Dietary issues and exercise activities discussed:     Goals Addressed   None   Depression Screen [facility] 2/9 Scores 10/10/2021 09/03/2021 06/06/2021 02/28/2021 01/15/2021 11/16/2019 11/10/2018  PHQ - 2 Score 0 0 0 0 0 0 0  PHQ- 9 Score - - - - 0 - -    Fall Risk Fall Risk  10/10/2021 09/03/2021 06/06/2021 02/28/2021 01/15/2021  Falls in the past year? 0 0 0 1 0  Number falls in past yr: 0 0 0 0 -  Injury with Fall? 0 0 0 1 -  Risk for fall due to : No Fall Risks No Fall Risks No Fall Risks - Impaired balance/gait;Other (Comment)  Risk for fall due to: Comment - - - - back pain, neuropathy  Follow up Falls evaluation completed Falls evaluation completed Falls evaluation completed Falls evaluation completed;Education provided;Falls prevention discussed;Follow up appointment Falls evaluation completed    FALL RISK PREVENTION PERTAINING TO THE HOME:  Any stairs in or around the home? No  If so, are there any without handrails? No  Home free of loose throw rugs in walkways, pet beds, electrical cords, etc? Yes  Adequate lighting in your home to reduce risk of falls? Yes   ASSISTIVE DEVICES UTILIZED TO PREVENT FALLS:  Life alert? No  Use of a cane, walker or w/c? No  Grab bars in the bathroom? No  Shower chair or bench in shower? No  Elevated toilet seat or a handicapped toilet? No   Cognitive Function:        Immunizations Immunization History  Administered Date(s) Administered   Fluad Quad(high Dose 65+) 08/18/2019, 09/12/2020,  10/10/2021   Influenza, High Dose Seasonal PF 09/29/2015, 11/10/2018   Influenza,inj,Quad PF,6+ Mos 10/15/2016, 09/18/2017   Influenza-Unspecified 02/25/2011, 11/13/2014   Moderna SARS-COV2 Booster Vaccination 11/01/2020   Moderna Sars-Covid-2 Vaccination 01/28/2020, 02/28/2020   Pneumococcal Conjugate-13 11/20/2015   Pneumococcal Polysaccharide-23 10/15/2016   Tdap 02/25/2011   Zoster, Live 05/30/2013    TDAP status: Up to date  Flu Vaccine status: Up to date  Pneumococcal vaccine status: Up to date  Covid-19 vaccine status: Completed vaccines  Qualifies for Shingles Vaccine? Yes   Zostavax completed No   Shingrix Completed?: No.    Education has been provided regarding the importance of this vaccine. Patient has been advised to call insurance company to determine out of pocket expense if they have not yet received this vaccine. Advised may also receive vaccine at local pharmacy or Health Dept. Verbalized acceptance and understanding.  Screening Tests Health Maintenance  Topic Date Due   Zoster Vaccines- Shingrix (1 of 2) Never done   COVID-19 Vaccine (3 - Moderna risk series) 11/29/2020   TETANUS/TDAP  09/03/2022 (Originally 02/24/2021)   COLONOSCOPY (Pts 45-53yrs Insurance coverage will need to be confirmed)  07/18/2027   Pneumonia Vaccine 62+ Years old  Completed   INFLUENZA VACCINE  Completed   Hepatitis C Screening  Completed   HPV VACCINES  Aged Out    Health Maintenance  Health Maintenance Due  Topic Date Due   Zoster Vaccines- Shingrix (1 of 2) Never done   COVID-19 Vaccine (3 - Moderna risk series) 11/29/2020    Colorectal cancer screening: Type of screening: Colonoscopy. Completed 07-17-17. Repeat every 10 years  Lung Cancer Screening: (Low Dose CT Chest recommended if Age 73-80 years, 30 pack-year currently smoking OR have quit w/in 15years.) does not qualify.    Additional Screening:  Hepatitis C Screening: does qualify; Completed 10-15-16  Vision  Screening: Recommended annual ophthalmology exams for early detection of glaucoma and other disorders of the eye. Is the patient up to date with their annual eye exam?  Yes  Who is  the provider or what is the name of the office in which the patient attends annual eye exams? [facility] Hoff If pt is not established with a provider, would they like to be referred to a provider to establish care? No .   Dental Screening: Recommended annual dental exams for proper oral hygiene  Community Resource Referral / Chronic Care Management: CRR required this visit?  No   CCM required this visit?  No      Plan:     I have personally reviewed and noted the following in the patients chart:   Medical and social history Use of alcohol, tobacco or illicit drugs  Current medications and supplements including opioid prescriptions. Patient is not currently taking opioid prescriptions. Functional ability and status Nutritional status Physical activity Advanced directives List of other physicians Hospitalizations, surgeries, and ER visits in previous 12 months Vitals Screenings to include cognitive, depression, and falls Referrals and appointments  In addition, I have reviewed and discussed with patient certain preventive protocols, quality metrics, and best practice recommendations. A written personalized care plan for preventive services as well as general preventive health recommendations were provided to patient.     Shelda Altes, CMA   02/03/2022   Nurse Notes:  Luis Stewart , Thank you for taking time to come for your Medicare Wellness Visit. I appreciate your ongoing commitment to your health goals. Please review the following plan we discussed and let me know if I can assist you in the future.   These are the goals we discussed:  Goals   None     This is a list of the screening recommended for you and due dates:  Health Maintenance  Topic Date Due   Zoster (Shingles) Vaccine  (1 of 2) Never done   COVID-19 Vaccine (3 - Moderna risk series) 11/29/2020   Tetanus Vaccine  09/03/2022*   Colon Cancer Screening  07/18/2027   Pneumonia Vaccine  Completed   Flu Shot  Completed   Hepatitis C Screening: USPSTF Recommendation to screen - Ages 18-79 yo.  Completed   HPV Vaccine  Aged Out  *Topic was postponed. The date shown is not the original due date.

## 2022-02-06 DIAGNOSIS — E8809 Other disorders of plasma-protein metabolism, not elsewhere classified: Secondary | ICD-10-CM | POA: Diagnosis not present

## 2022-02-06 DIAGNOSIS — E559 Vitamin D deficiency, unspecified: Secondary | ICD-10-CM | POA: Diagnosis not present

## 2022-02-06 DIAGNOSIS — Z0000 Encounter for general adult medical examination without abnormal findings: Secondary | ICD-10-CM | POA: Diagnosis not present

## 2022-02-06 DIAGNOSIS — E785 Hyperlipidemia, unspecified: Secondary | ICD-10-CM | POA: Diagnosis not present

## 2022-02-07 LAB — TSH: TSH: 4.07 u[IU]/mL (ref 0.450–4.500)

## 2022-02-07 LAB — CMP14+EGFR
ALT: 12 IU/L (ref 0–44)
AST: 22 IU/L (ref 0–40)
Albumin/Globulin Ratio: 0.5 — ABNORMAL LOW (ref 1.2–2.2)
Albumin: 3.5 g/dL — ABNORMAL LOW (ref 3.7–4.7)
Alkaline Phosphatase: 93 IU/L (ref 44–121)
BUN/Creatinine Ratio: 12 (ref 10–24)
BUN: 18 mg/dL (ref 8–27)
Bilirubin Total: 0.3 mg/dL (ref 0.0–1.2)
CO2: 27 mmol/L (ref 20–29)
Calcium: 9.1 mg/dL (ref 8.6–10.2)
Chloride: 103 mmol/L (ref 96–106)
Creatinine, Ser: 1.53 mg/dL — ABNORMAL HIGH (ref 0.76–1.27)
Globulin, Total: 6.4 g/dL — ABNORMAL HIGH (ref 1.5–4.5)
Glucose: 88 mg/dL (ref 70–99)
Potassium: 4.3 mmol/L (ref 3.5–5.2)
Sodium: 138 mmol/L (ref 134–144)
Total Protein: 9.9 g/dL — ABNORMAL HIGH (ref 6.0–8.5)
eGFR: 48 mL/min/{1.73_m2} — ABNORMAL LOW (ref >59)

## 2022-02-07 LAB — CBC WITH DIFFERENTIAL/PLATELET
Basophils Absolute: 0 10*3/uL (ref 0.0–0.2)
Basos: 1 %
EOS (ABSOLUTE): 0.1 10*3/uL (ref 0.0–0.4)
Eos: 3 %
Hematocrit: 34 % — ABNORMAL LOW (ref 37.5–51.0)
Hemoglobin: 11.8 g/dL — ABNORMAL LOW (ref 13.0–17.7)
Immature Grans (Abs): 0 10*3/uL (ref 0.0–0.1)
Immature Granulocytes: 1 %
Lymphocytes Absolute: 1.8 10*3/uL (ref 0.7–3.1)
Lymphs: 38 %
MCH: 30.6 pg (ref 26.6–33.0)
MCHC: 34.7 g/dL (ref 31.5–35.7)
MCV: 88 fL (ref 79–97)
Monocytes Absolute: 0.3 10*3/uL (ref 0.1–0.9)
Monocytes: 7 %
Neutrophils Absolute: 2.4 10*3/uL (ref 1.4–7.0)
Neutrophils: 50 %
Platelets: 168 10*3/uL (ref 150–450)
RBC: 3.85 x10E6/uL — ABNORMAL LOW (ref 4.14–5.80)
RDW: 14.8 % (ref 11.6–15.4)
WBC: 4.8 10*3/uL (ref 3.4–10.8)

## 2022-02-07 LAB — LIPID PANEL
Chol/HDL Ratio: 3.5 ratio (ref 0.0–5.0)
Cholesterol, Total: 137 mg/dL (ref 100–199)
HDL: 39 mg/dL — ABNORMAL LOW (ref >39)
LDL Chol Calc (NIH): 86 mg/dL (ref 0–99)
Triglycerides: 55 mg/dL (ref 0–149)
VLDL Cholesterol Cal: 12 mg/dL (ref 5–40)

## 2022-02-07 LAB — HEMOGLOBIN A1C
Est. average glucose Bld gHb Est-mCnc: 128 mg/dL
Hgb A1c MFr Bld: 6.1 % — ABNORMAL HIGH (ref 4.8–5.6)

## 2022-02-07 LAB — VITAMIN D 25 HYDROXY (VIT D DEFICIENCY, FRACTURES): Vit D, 25-Hydroxy: 35.8 ng/mL (ref 30.0–100.0)

## 2022-02-12 ENCOUNTER — Other Ambulatory Visit: Payer: Self-pay

## 2022-02-12 ENCOUNTER — Ambulatory Visit (INDEPENDENT_AMBULATORY_CARE_PROVIDER_SITE_OTHER): Payer: Medicare HMO | Admitting: Internal Medicine

## 2022-02-12 ENCOUNTER — Encounter: Payer: Self-pay | Admitting: Internal Medicine

## 2022-02-12 VITALS — BP 128/64 | HR 78 | Resp 18 | Ht 74.0 in | Wt 181.2 lb

## 2022-02-12 DIAGNOSIS — Z0001 Encounter for general adult medical examination with abnormal findings: Secondary | ICD-10-CM

## 2022-02-12 DIAGNOSIS — C61 Malignant neoplasm of prostate: Secondary | ICD-10-CM

## 2022-02-12 DIAGNOSIS — E8809 Other disorders of plasma-protein metabolism, not elsewhere classified: Secondary | ICD-10-CM | POA: Diagnosis not present

## 2022-02-12 DIAGNOSIS — K219 Gastro-esophageal reflux disease without esophagitis: Secondary | ICD-10-CM | POA: Diagnosis not present

## 2022-02-12 NOTE — Assessment & Plan Note (Signed)
Has had prostate biopsy Elevated PSA Follows up with Urologist - under PSA surveillance Did not tolerate Finasteride Referred to Oncology for concern of pelvic mass

## 2022-02-12 NOTE — Assessment & Plan Note (Signed)

## 2022-02-12 NOTE — Patient Instructions (Addendum)
Please continue taking medications as prescribed.  You are being referred to Oncology.  Please continue to follow low carb diet and ambulate as tolerated.  Please consider getting Shingrix vaccine at your local pharmacy.

## 2022-02-12 NOTE — Assessment & Plan Note (Signed)
With decreased albumin to globulin ratio Also has declining GFR Has lumbar spine mass Referred to oncology for further eval

## 2022-02-12 NOTE — Assessment & Plan Note (Signed)
On Pantoprazole 40 mg QOD 

## 2022-02-12 NOTE — Progress Notes (Signed)
Established Patient Office Visit  Subjective:  Patient ID: Luis Stewart, male    DOB: 11/21/1951  Age: 71 y.o. MRN: [id number]  CC:  Chief Complaint  Patient presents with   Annual Exam    Annual exam pt having back and leg pain still     HPI Luis Stewart is a 71 y.o. male with past medical history of GERD, chronic low back pain, elevated PSA, asbestosis and atypical chest pain who presents for annual physical.  He has been having chronic low back pain, which is dull, intermittent, radiating to b/l legs and is associated with bilateral LE numbness and tingling, which is intermittent.  He has seen Dr. Aline Brochure for it.  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  Blood tests were reviewed and discussed with the patient in detail.  He is GFR has declined compared to prior.  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  Considering his history of prostate cancer and recent mass on CT of the pelvis and MRI of lumbar spine along with hyperproteinemia, I offered referral to oncology, he agrees.    Past Medical History:  Diagnosis Date   Arthritis    Asbestosis ([place])    Closed fracture of distal end of fibula with tibia with routine healing 05/28/2020   COVID-19 09/03/2021   Cyst of kidney, acquired    Headache    History of fracture of leg    Right, childhood   Hx of migraine headaches    Hyperlipidemia    Neuropathy    PE (pulmonary thromboembolism) ([place])    After knee surgery   Prostate cancer ([place]) 2015   Adenocarcinoma by biopsy    Past Surgical History:  Procedure Laterality Date   BIOPSY PROSTATE  2003 and 2005   COLONOSCOPY  11/28/2008   Dr. Rourk:internal hemorrhoids/tortus colon/ascending colon polyps, tubular adenomas   COLONOSCOPY N/A 09/04/2014   Procedure: COLONOSCOPY;  Surgeon: Daneil Dolin, MD;  [facility];  Service: Endoscopy;  Laterality: N/A;  9:30   COLONOSCOPY N/A 07/17/2017   Procedure: COLONOSCOPY;  Surgeon: Danie Binder, MD;  [facility];  Service: Endoscopy;  Laterality: N/A;   ESOPHAGOGASTRODUODENOSCOPY N/A 07/16/2017   Procedure: ESOPHAGOGASTRODUODENOSCOPY (EGD);  Surgeon: Danie Binder, MD;  [facility];  Service: Endoscopy;  Laterality: N/A;   GIVENS CAPSULE STUDY  07/16/2017   Procedure: GIVENS CAPSULE STUDY;  Surgeon: Danie Binder, MD;  [facility];  Service: Endoscopy;;   JOINT REPLACEMENT N/A    Phreesia 01/12/2021   POLYPECTOMY  07/17/2017   Procedure: POLYPECTOMY;  Surgeon: Danie Binder, MD;  [facility];  Service: Endoscopy;;  cecal   TOTAL KNEE ARTHROPLASTY Right 06/10/2017   TOTAL KNEE ARTHROPLASTY Right 06/10/2017   Procedure: TOTAL KNEE ARTHROPLASTY;  Surgeon: [name], MD;  Location: [facility];  Service: Orthopedics;  Laterality: Right;    Family History  Problem Relation Age of Onset   Diabetes Mother    Hypertension Mother    Obesity Mother    Heart disease Mother    Mental illness Mother    Hypertension Sister    Obesity Sister    Arthritis Sister    Deep vein thrombosis Father    Dementia Father    Heart disease Father  CABG   Colon cancer Neg Hx     Social History   Socioeconomic History   Marital status: Married    Spouse name: Not on file   Number of children: Not on file   Years of education: Not on file   Highest education level: Not on file  Occupational History   Occupation: Retired  Tobacco Use   Smoking status: Never   Smokeless tobacco: Never  Vaping Use   Vaping Use: Never used  Substance and Sexual Activity   Alcohol use: No   Drug use: No   Sexual activity: Yes  Other Topics Concern   Not on file  Social History Narrative   Not on file   Social Determinants of Health   Financial Resource Strain: Not on file  Food  Insecurity: Not on file  Transportation Needs: No Transportation Needs   Lack of Transportation (Medical): No   Lack of Transportation (Non-Medical): No  Physical Activity: Sufficiently Active   Days of Exercise per Week: 4 days   Minutes of Exercise per Session: 60 min  Stress: Not on file  Social Connections: Moderately Integrated   Frequency of Communication with Friends and Family: More than three times a week   Frequency of Social Gatherings with Friends and Family: More than three times a week   Attends Religious Services: More than 4 times per year   Active Member of [employer] or Organizations: No   Attends Archivist Meetings: Never   Marital Status: Married  Human resources officer Violence: Not At Risk   Fear of Current or Ex-Partner: No   Emotionally Abused: No   Physically Abused: No   Sexually Abused: No    Outpatient Medications Prior to Visit  Medication Sig Dispense Refill   acetaminophen (TYLENOL) 500 MG tablet Take 1,000 mg by mouth every 6 (six) hours as needed for mild pain or moderate pain.     benzonatate (TESSALON) 100 MG capsule Take 1 capsule (100 mg total) by mouth 2 (two) times daily as needed for cough. 20 capsule 0   Cholecalciferol (VITAMIN D) 2000 units CAPS Take 2,000 Units by mouth 3 (three) times a week.      methocarbamol (ROBAXIN) 500 MG tablet Take 1 tablet (500 mg total) by mouth 3 (three) times daily. 60 tablet 1   Multiple Vitamin (MULTIVITAMIN WITH MINERALS) TABS tablet Take 1 tablet by mouth daily. Centrum Silver for Men     nitroGLYCERIN (NITROSTAT) 0.4 MG SL tablet Place 1 tablet (0.4 mg total) under the tongue every 5 (five) minutes as needed for chest pain. 30 tablet 0   pantoprazole (PROTONIX) 40 MG tablet Take 1 tablet (40 mg total) by mouth every other day. 90 tablet 0   No facility-administered medications prior to visit.    Allergies  Allergen Reactions   Sulfa Antibiotics Other (See Comments)    Unknown reaction. Childhood  reaction    ROS Review of Systems  Constitutional:  Negative for chills and fever.  HENT:  Negative for congestion and sore throat.   Eyes:  Negative for pain and discharge.  Respiratory:  Negative for cough and shortness of breath.   Cardiovascular:  Negative for chest pain and palpitations.  Gastrointestinal:  Negative for constipation, diarrhea, nausea and vomiting.  Endocrine: Negative for polydipsia and polyuria.  Genitourinary:  Negative for dysuria and hematuria.  Musculoskeletal:  Positive for back pain. Negative for neck pain and neck stiffness.  Skin:  Negative for rash.  Neurological:  Positive  for numbness. Negative for dizziness, weakness and headaches.  Psychiatric/Behavioral:  Negative for agitation and behavioral problems.      Objective:    Physical Exam Vitals reviewed.  Constitutional:      General: He is not in acute distress.    Appearance: He is not diaphoretic.  HENT:     Head: Normocephalic and atraumatic.     Nose: Nose normal.     Mouth/Throat:     Mouth: Mucous membranes are moist.  Eyes:     General: No scleral icterus.    Extraocular Movements: Extraocular movements intact.  Cardiovascular:     Rate and Rhythm: Normal rate and regular rhythm.     Pulses: Normal pulses.     Heart sounds: Normal heart sounds. No murmur heard. Pulmonary:     Breath sounds: Normal breath sounds. No wheezing or rales.  Abdominal:     Palpations: Abdomen is soft.     Tenderness: There is no abdominal tenderness.  Musculoskeletal:     Cervical back: Neck supple. No tenderness.     Right lower leg: No edema.     Left lower leg: No edema.  Skin:    General: Skin is warm.     Findings: No rash.  Neurological:     General: No focal deficit present.     Mental Status: He is alert and oriented to person, place, and time.     Sensory: Sensory deficit (B/l feet) present.     Motor: No weakness.  Psychiatric:        Mood and Affect: Mood normal.        Behavior:  Behavior normal.    BP 128/64 (BP Location: Right Arm, Patient Position: Sitting, Cuff Size: Normal)    Pulse 78    Resp 18    Ht '6\' 2"'  (1.88 m)    Wt 181 lb 3.2 oz (82.2 kg)    SpO2 96%    BMI 23.26 kg/m  Wt Readings from Last 3 Encounters:  02/12/22 181 lb 3.2 oz (82.2 kg)  01/31/22 180 lb (81.6 kg)  12/24/21 181 lb 3.2 oz (82.2 kg)    Lab Results  Component Value Date   TSH 4.070 02/06/2022   Lab Results  Component Value Date   WBC 4.8 02/06/2022   HGB 11.8 (L) 02/06/2022   HCT 34.0 (L) 02/06/2022   MCV 88 02/06/2022   PLT 168 02/06/2022   Lab Results  Component Value Date   NA 138 02/06/2022   K 4.3 02/06/2022   CO2 27 02/06/2022   GLUCOSE 88 02/06/2022   BUN 18 02/06/2022   CREATININE 1.53 (H) 02/06/2022   BILITOT 0.3 02/06/2022   ALKPHOS 93 02/06/2022   AST 22 02/06/2022   ALT 12 02/06/2022   PROT 9.9 (H) 02/06/2022   ALBUMIN 3.5 (L) 02/06/2022   CALCIUM 9.1 02/06/2022   ANIONGAP 5 10/28/2018   EGFR 48 (L) 02/06/2022   Lab Results  Component Value Date   CHOL 137 02/06/2022   Lab Results  Component Value Date   HDL 39 (L) 02/06/2022   Lab Results  Component Value Date   LDLCALC 86 02/06/2022   Lab Results  Component Value Date   TRIG 55 02/06/2022   Lab Results  Component Value Date   CHOLHDL 3.5 02/06/2022   Lab Results  Component Value Date   HGBA1C 6.1 (H) 02/06/2022      Assessment & Plan:   Encounter for general adult medical examination with abnormal  findings Physical exam as documented. Counseling done  re healthy lifestyle involving commitment to 150 minutes exercise per week, heart healthy diet, and attaining healthy weight.The importance of adequate sleep also discussed. Changes in health habits are decided on by the patient with goals and time frames  set for achieving them. Immunization and cancer screening needs are specifically addressed at this visit.   Prostate cancer [facility]) Has had prostate biopsy Elevated  PSA Follows up with Urologist - under PSA surveillance Did not tolerate Finasteride Referred to Oncology for concern of pelvic mass  GERD (gastroesophageal reflux disease) On Pantoprazole 40 mg QOD  Hyperproteinemia With decreased albumin to globulin ratio Also has declining GFR Has lumbar spine mass Referred to oncology for further eval    No orders of the defined types were placed in this encounter.   Follow-up: Return in about 6 months (around 08/12/2022) for Lumbar spinal stenosis.    Lindell Spar, MD

## 2022-02-26 ENCOUNTER — Other Ambulatory Visit: Payer: Self-pay

## 2022-02-26 ENCOUNTER — Inpatient Hospital Stay (HOSPITAL_COMMUNITY): Payer: Medicare HMO

## 2022-02-26 ENCOUNTER — Encounter (HOSPITAL_COMMUNITY): Payer: Self-pay | Admitting: Hematology

## 2022-02-26 ENCOUNTER — Inpatient Hospital Stay (HOSPITAL_COMMUNITY): Payer: Medicare HMO | Attending: Hematology | Admitting: Hematology

## 2022-02-26 VITALS — BP 148/77 | HR 70 | Temp 96.7°F | Resp 18 | Ht 73.43 in | Wt 180.6 lb

## 2022-02-26 DIAGNOSIS — D473 Essential (hemorrhagic) thrombocythemia: Secondary | ICD-10-CM | POA: Insufficient documentation

## 2022-02-26 DIAGNOSIS — Z8546 Personal history of malignant neoplasm of prostate: Secondary | ICD-10-CM | POA: Insufficient documentation

## 2022-02-26 DIAGNOSIS — Z86711 Personal history of pulmonary embolism: Secondary | ICD-10-CM | POA: Insufficient documentation

## 2022-02-26 DIAGNOSIS — Z79899 Other long term (current) drug therapy: Secondary | ICD-10-CM | POA: Insufficient documentation

## 2022-02-26 DIAGNOSIS — Z800 Family history of malignant neoplasm of digestive organs: Secondary | ICD-10-CM | POA: Insufficient documentation

## 2022-02-26 DIAGNOSIS — Z9079 Acquired absence of other genital organ(s): Secondary | ICD-10-CM | POA: Insufficient documentation

## 2022-02-26 DIAGNOSIS — C61 Malignant neoplasm of prostate: Secondary | ICD-10-CM

## 2022-02-26 DIAGNOSIS — D649 Anemia, unspecified: Secondary | ICD-10-CM | POA: Insufficient documentation

## 2022-02-26 DIAGNOSIS — M899 Disorder of bone, unspecified: Secondary | ICD-10-CM | POA: Insufficient documentation

## 2022-02-26 DIAGNOSIS — E8809 Other disorders of plasma-protein metabolism, not elsewhere classified: Secondary | ICD-10-CM | POA: Insufficient documentation

## 2022-02-26 DIAGNOSIS — E559 Vitamin D deficiency, unspecified: Secondary | ICD-10-CM

## 2022-02-26 LAB — IRON AND TIBC
Iron: 126 ug/dL (ref 45–182)
Saturation Ratios: 42 % — ABNORMAL HIGH (ref 17.9–39.5)
TIBC: 301 ug/dL (ref 250–450)
UIBC: 175 ug/dL

## 2022-02-26 LAB — FOLATE: Folate: 15.3 ng/mL (ref >5.9)

## 2022-02-26 LAB — PSA: Prostatic Specific Antigen: 21.1 ng/mL — ABNORMAL HIGH (ref 0.00–4.00)

## 2022-02-26 LAB — FERRITIN: Ferritin: 168 ng/mL (ref 24–336)

## 2022-02-26 LAB — LACTATE DEHYDROGENASE: LDH: 160 U/L (ref 98–192)

## 2022-02-26 LAB — VITAMIN B12: Vitamin B-12: 449 pg/mL (ref 180–914)

## 2022-02-26 NOTE — Progress Notes (Signed)
[facility] [address], [place] [address]   CLINIC:  Medical Oncology/Hematology  CONSULT NOTE  Patient Care Team: Lindell Spar, MD as PCP - General (Internal Medicine) Satira Sark, MD as PCP - Cardiology (Cardiology) Gala Romney Cristopher Estimable, MD as Consulting Physician (Gastroenterology) [name], MD as Medical Oncologist (Medical Oncology)  CHIEF COMPLAINTS/PURPOSE OF CONSULTATION:  Evaluation of prostate cancer  HISTORY OF PRESENTING ILLNESS:  [name] 71 y.o. male is here because of evaluation of prostate cancer, at the request of Dr. Posey Pronto.  Today he reports feeling well, and he is accompanied by his wife. He has a history of prostate cancer for which he reports he has been under clinical surveillance since 2016. He reports his baseline PSA is 16-17. He has a Covid infection in fall 2022. He reports chronic back pain which has been present for years. He denies recent weight loss and new pains. He denies history of CVA and MI.   He currently lives at home with his wife. 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 He denies smoking history. His sister had hepatocellular cancer, his paternal uncle had prostate cancer, and his paternal cousin had metastatic cancer.   MEDICAL HISTORY:  Past Medical History:  Diagnosis Date   Arthritis    Asbestosis ([place])    Closed fracture of distal end of fibula with tibia with routine healing 05/28/2020   COVID-19 09/03/2021   Cyst of kidney, acquired    Headache    History of fracture of leg    Right, childhood   Hx of migraine headaches    Hyperlipidemia    Neuropathy    PE (pulmonary thromboembolism) ([place])    After knee surgery   Prostate cancer ([place]) 2015   Adenocarcinoma by biopsy    SURGICAL HISTORY: Past Surgical History:  Procedure Laterality Date   BIOPSY PROSTATE  2003 and 2005    COLONOSCOPY  11/28/2008   Dr. Rourk:internal hemorrhoids/tortus colon/ascending colon polyps, tubular adenomas   COLONOSCOPY N/A 09/04/2014   Procedure: COLONOSCOPY;  Surgeon: Daneil Dolin, MD;  [facility];  Service: Endoscopy;  Laterality: N/A;  9:30   COLONOSCOPY N/A 07/17/2017   Procedure: COLONOSCOPY;  Surgeon: Danie Binder, MD;  [facility];  Service: Endoscopy;  Laterality: N/A;   ESOPHAGOGASTRODUODENOSCOPY N/A 07/16/2017   Procedure: ESOPHAGOGASTRODUODENOSCOPY (EGD);  Surgeon: Danie Binder, MD;  [facility];  Service: Endoscopy;  Laterality: N/A;   GIVENS CAPSULE STUDY  07/16/2017   Procedure: GIVENS CAPSULE STUDY;  Surgeon: Danie Binder, MD;  [facility];  Service: Endoscopy;;   JOINT REPLACEMENT N/A    Phreesia 01/12/2021   POLYPECTOMY  07/17/2017   Procedure: POLYPECTOMY;  Surgeon: Danie Binder, MD;  [facility];  Service: Endoscopy;;  cecal   TOTAL KNEE ARTHROPLASTY Right 06/10/2017   TOTAL KNEE ARTHROPLASTY Right 06/10/2017   Procedure: TOTAL KNEE ARTHROPLASTY;  Surgeon: [name], MD;  Location: [facility];  Service: Orthopedics;  Laterality: Right;    SOCIAL HISTORY: Social History   Socioeconomic History   Marital status: Married    Spouse name: Not on file   Number of children: Not on file   Years of education: Not on file   Highest education level: Not on file  Occupational History   Occupation: Retired  Tobacco Use  Smoking status: Never   Smokeless tobacco: Never  Vaping Use   Vaping Use: Never used  Substance and Sexual Activity   Alcohol use: No   Drug use: No   Sexual activity: Yes  Other Topics Concern   Not on file  Social History Narrative   Not on file   Social Determinants of Health   Financial Resource Strain: Not on file  Food Insecurity: Not on file  Transportation Needs: No Transportation Needs   Lack of Transportation (Medical): No   Lack of Transportation  (Non-Medical): No  Physical Activity: Sufficiently Active   Days of Exercise per Week: 4 days   Minutes of Exercise per Session: 60 min  Stress: Not on file  Social Connections: Moderately Integrated   Frequency of Communication with Friends and Family: More than three times a week   Frequency of Social Gatherings with Friends and Family: More than three times a week   Attends Religious Services: More than 4 times per year   Active Member of [employer] or Organizations: No   Attends Music therapist: Never   Marital Status: Married  Human resources officer Violence: Not At Risk   Fear of Current or Ex-Partner: No   Emotionally Abused: No   Physically Abused: No   Sexually Abused: No    FAMILY HISTORY: Family History  Problem Relation Age of Onset   Diabetes Mother    Hypertension Mother    Obesity Mother    Heart disease Mother    Mental illness Mother    Hypertension Sister    Obesity Sister    Arthritis Sister    Deep vein thrombosis Father    Dementia Father    Heart disease Father        CABG   Colon cancer Neg Hx     ALLERGIES:  is allergic to sulfa antibiotics.  MEDICATIONS:  Current Outpatient Medications  Medication Sig Dispense Refill   acetaminophen (TYLENOL) 500 MG tablet Take 1,000 mg by mouth every 6 (six) hours as needed for mild pain or moderate pain.     benzonatate (TESSALON) 100 MG capsule Take 1 capsule (100 mg total) by mouth 2 (two) times daily as needed for cough. 20 capsule 0   Cholecalciferol (VITAMIN D) 2000 units CAPS Take 2,000 Units by mouth 3 (three) times a week.      methocarbamol (ROBAXIN) 500 MG tablet Take 1 tablet (500 mg total) by mouth 3 (three) times daily. 60 tablet 1   Multiple Vitamin (MULTIVITAMIN WITH MINERALS) TABS tablet Take 1 tablet by mouth daily. Centrum Silver for Men     nitroGLYCERIN (NITROSTAT) 0.4 MG SL tablet Place 1 tablet (0.4 mg total) under the tongue every 5 (five) minutes as needed for chest pain. 30 tablet  0   pantoprazole (PROTONIX) 40 MG tablet Take 1 tablet (40 mg total) by mouth every other day. 90 tablet 0   No current facility-administered medications for this visit.    REVIEW OF SYSTEMS:   Review of Systems  Constitutional:  Negative for appetite change, fatigue and unexpected weight change.  Respiratory:  Positive for cough.   Gastrointestinal:  Positive for diarrhea and nausea.  Genitourinary:  Positive for frequency.   Musculoskeletal:  Positive for arthralgias (legs) and back pain (chronic).  Psychiatric/Behavioral:  Positive for sleep disturbance.   All other systems reviewed and are negative.   PHYSICAL EXAMINATION: ECOG PERFORMANCE STATUS: 1 - Symptomatic but completely ambulatory  Vitals:   02/26/22 0821  BP: Marland Kitchen)  148/77  Pulse: 70  Resp: 18  Temp: (!) 96.7 F (35.9 C)  SpO2: 98%   Filed Weights   02/26/22 0821  Weight: 180 lb 9.6 oz (81.9 kg)   Physical Exam Vitals reviewed.  Constitutional:      Appearance: Normal appearance.  Cardiovascular:     Rate and Rhythm: Normal rate and regular rhythm.     Pulses: Normal pulses.     Heart sounds: Normal heart sounds.  Pulmonary:     Effort: Pulmonary effort is normal.     Breath sounds: Normal breath sounds.  Abdominal:     Palpations: Abdomen is soft. There is no hepatomegaly, splenomegaly or mass.     Tenderness: There is no abdominal tenderness.  Musculoskeletal:     Right lower leg: No edema.     Left lower leg: No edema.  Lymphadenopathy:     Upper Body:     Right upper body: No supraclavicular adenopathy.     Left upper body: No supraclavicular adenopathy.     Lower Body: No right inguinal adenopathy. No left inguinal adenopathy.  Neurological:     General: No focal deficit present.     Mental Status: He is alert and oriented to person, place, and time.  Psychiatric:        Mood and Affect: Mood normal.        Behavior: Behavior normal.     LABORATORY DATA:  I have reviewed the data as  listed CBC Latest Ref Rng & Units 02/06/2022 01/11/2021 11/16/2019  WBC 3.4 - 10.8 x10E3/uL 4.8 3.9 3.5(L)  Hemoglobin 13.0 - 17.7 g/dL 11.8(L) 13.3 14.0  Hematocrit 37.5 - 51.0 % 34.0(L) 39.6 42.0  Platelets 150 - 450 x10E3/uL 168 142 170   CMP Latest Ref Rng & Units 02/06/2022 11/22/2021 10/21/2021  Glucose 70 - 99 mg/dL 88 86 95  BUN 8 - 27 mg/dL '18 15 16  '$ Creatinine 0.76 - 1.27 mg/dL 1.53(H) 1.23 1.35(H)  Sodium 134 - 144 mmol/L 138 139 140  Potassium 3.5 - 5.2 mmol/L 4.3 4.4 4.2  Chloride 96 - 106 mmol/L 103 105 106  CO2 20 - 29 mmol/L '27 29 27  '$ Calcium 8.6 - 10.2 mg/dL 9.1 9.4 9.4  Total Protein 6.0 - 8.5 g/dL 9.9(H) - -  Total Bilirubin 0.0 - 1.2 mg/dL 0.3 - -  Alkaline Phos 44 - 121 IU/L 93 - -  AST 0 - 40 IU/L 22 - -  ALT 0 - 44 IU/L 12 - -    RADIOGRAPHIC STUDIES: I have personally reviewed the radiological images as listed and agreed with the findings in the report. US Venous Img Lower Unilateral Left  Result Date: 01/31/2022 CLINICAL DATA:  Left leg pain for the past 3 days EXAM: LEFT LOWER EXTREMITY VENOUS DOPPLER ULTRASOUND TECHNIQUE: Gray-scale sonography with compression, as well as color and duplex ultrasound, were performed to evaluate the deep venous system(s) from the level of the common femoral vein through the popliteal and proximal calf veins. COMPARISON:  None. FINDINGS: VENOUS Normal compressibility of the common femoral, superficial femoral, and popliteal veins, as well as the visualized calf veins. Visualized portions of profunda femoral vein and great saphenous vein unremarkable. No filling defects to suggest DVT on grayscale or color Doppler imaging. Doppler waveforms show normal direction of venous flow, normal respiratory plasticity and response to augmentation. Limited views of the contralateral common femoral vein are unremarkable. OTHER None. Limitations: none IMPRESSION: Negative. Electronically Signed   By: Jacqulynn Cadet  M.D.   On: 01/31/2022 11:57     ASSESSMENT:  Prostate cancer: - TRUS/Bx on 06/14/2015: 1/12 cores positive for adenocarcinoma-10% of the core in the right lateral base revealed Gleason 3+3=6.  Prostate volume 175 cc.  PSA was 16.6.  PSA density 0.09. - Seen by Dr. Louis Meckel in [facility] for robotic prostatectomy.  Due to low-volume prostate cancer and very low PSA density, it was recommended that he continue active surveillance. - 01/21/2016: Surveillance TRUS/biopsy, following a prostatic MRI which revealed no suspicious prostatic lesions and correlated the patient's large prostate.  All 12 cores were negative for prostate cancer.  Prostate volume was 190 mL. - 08/11/2018: Fusion TRUS/biopsy: Recent prostate MRI-volume 220 mL, no significant lesions.  No evidence of transcapsular/.  Ureteral, SV/bony or lymphatic disease.  Path-1 core (left apex) revealed Gleason 3+3 and 5% of core. - PSA: 17.16 (10/09/2015), 15.4 (11/04/2016), 17.9 (05/15/2017), 18.4 (04/19/2018), 15.9 (02/15/2019), 15.5 (03/20/2020), 22.4 (03/20/2021) - MRI of the lumbar spine with and without contrast on 11/27/2021 done for back pain showed expanded appearance of the sacrum with diffusely abnormal bone marrow signal and contrast-enhancement concerning for metastatic disease. - CT pelvis with contrast on 11/27/2021: Expansile, heterogeneous trabeculated appearance of the sacrum with thickening of the cortex, unchanged from multiple prior exams.  No evidence of pelvic lymphadenopathy.  Extreme prostatomegaly.   Social/family history: - Lives at home with his wife.  Retired in 2003 from [employer].  He works part-time work at an Animator.  Non-smoker. - Sister had cholangiocarcinoma.  Paternal first cousin had metastatic cancer.  Paternal uncle had prostate cancer.   PLAN:  Prostate cancer: - We have reviewed results of the MRI and CT scan of the pelvis. - Patient is extremely concerned about the possible metastatic disease mentioned in the MRI of the lumbar  spine. - His last PSA was 22.5 on 11/05/2021. - Recommend repeating PSA.  We will also obtain a PET scan. - RTC after the scan.  2.  Elevated total protein: - Labs on 02/06/2022 shows total protein 9.9. - We will do myeloma work-up including SPEP, immunofixation and free light chains.  3.  Normocytic anemia: - CBC on 02/06/2022 shows hemoglobin 11.8 with MCV 88.  Will check anemia panel including T59, folic acid, MMA, ferritin, iron panel, copper.   All questions were answered. The patient knows to call the clinic with any problems, questions or concerns.   [name], MD, 02/26/22 8:55 AM  [place] [phone]   I, Thana Ates, am acting as a scribe for Dr. [name].  I, [name] MD, have reviewed the above documentation for accuracy and completeness, and I agree with the above.

## 2022-02-26 NOTE — Patient Instructions (Addendum)
[place] at [facility] ?Discharge Instructions ? ?You were seen and examined today by Dr. Delton Coombes. Dr. Delton Coombes is a medical oncologist, meaning that he specializes in the treatment of cancer diagnoses. Dr. Delton Coombes discussed your past medical history, family history of cancers, and the events that led to you being here today. ? ?You were referred to Dr. Delton Coombes by Dr. Posey Pronto due to abnormal scans, concerning for cancer recurrence.  ? ?Dr. Delton Coombes has recommended a PET scan. A PET scan is a specialized CT scan that illuminates cancer if it is present in the body. This will likely be helpful to clearly decipher if there is anything concerning for recurrence of cancer. Dr. Delton Coombes also recommends a repeat PSA at this time. ? ?On your recent lab work, your total protein presence in your blood. Dr. Delton Coombes has recommended additional lab work in order to identify the cause of this abnormal protein. ? ?Follow-up with Dr. Delton Coombes following the PET scan. ? ? ?Thank you for choosing [place] at [facility] to provide your oncology and hematology care.  To afford each patient quality time with our provider, please arrive at least 15 minutes before your scheduled appointment time.  ? ?If you have a lab appointment with the [facility] please come in thru the Main Entrance and check in at the main information desk. ? ?You need to re-schedule your appointment should you arrive 10 or more minutes late.  We strive to give you quality time with our providers, and arriving late affects you and other patients whose appointments are after yours.  Also, if you no show three or more times for appointments you may be dismissed from the clinic at the providers discretion.     ?Again, thank you for choosing [facility].  Our hope is that these requests will decrease the amount of time that you wait before being seen by our physicians.        ?_____________________________________________________________ ? ?Should you have questions after your visit to [facility], please contact our office at [phone] and follow the prompts.  Our office hours are 8:00 a.m. and 4:30 p.m. Monday - Friday.  Please note that voicemails left after 4:00 p.m. may not be returned until the following business day.  We are closed weekends and major holidays.  You do have access to a nurse 24-7, just call the main number to the clinic [phone] and do not press any options, hold on the line and a nurse will answer the phone.   ? ?For prescription refill requests, have your pharmacy contact our office and allow 72 hours.   ? ?Due to Covid, you will need to wear a mask upon entering the hospital. If you do not have a mask, a mask will be given to you at the Main Entrance upon arrival. For doctor visits, patients may have 1 support person age 31 or older with them. For treatment visits, patients can not have anyone with them due to social distancing guidelines and our immunocompromised population.  ? ? ? ?

## 2022-02-27 LAB — KAPPA/LAMBDA LIGHT CHAINS
Kappa free light chain: 626.8 mg/L — ABNORMAL HIGH (ref 3.3–19.4)
Kappa, lambda light chain ratio: 106.24 — ABNORMAL HIGH (ref 0.26–1.65)
Lambda free light chains: 5.9 mg/L (ref 5.7–26.3)

## 2022-02-28 LAB — COPPER, SERUM: Copper: 118 ug/dL (ref 69–132)

## 2022-03-02 LAB — METHYLMALONIC ACID, SERUM: Methylmalonic Acid, Quantitative: 194 nmol/L (ref 0–378)

## 2022-03-03 LAB — PROTEIN ELECTROPHORESIS, SERUM
A/G Ratio: 0.6 — ABNORMAL LOW (ref 0.7–1.7)
Albumin ELP: 3.6 g/dL (ref 2.9–4.4)
Alpha-1-Globulin: 0.3 g/dL (ref 0.0–0.4)
Alpha-2-Globulin: 0.7 g/dL (ref 0.4–1.0)
Beta Globulin: 0.8 g/dL (ref 0.7–1.3)
Gamma Globulin: 4.6 g/dL — ABNORMAL HIGH (ref 0.4–1.8)
Globulin, Total: 6.3 g/dL — ABNORMAL HIGH (ref 2.2–3.9)
M-Spike, %: 4.3 g/dL — ABNORMAL HIGH
Total Protein ELP: 9.9 g/dL — ABNORMAL HIGH (ref 6.0–8.5)

## 2022-03-03 LAB — IMMUNOFIXATION ELECTROPHORESIS
IgA: 19 mg/dL — ABNORMAL LOW (ref 61–437)
IgG (Immunoglobin G), Serum: 4990 mg/dL — ABNORMAL HIGH (ref 603–1613)
IgM (Immunoglobulin M), Srm: 21 mg/dL (ref 15–143)
Total Protein ELP: 9.7 g/dL — ABNORMAL HIGH (ref 6.0–8.5)

## 2022-03-11 ENCOUNTER — Encounter (HOSPITAL_COMMUNITY)
Admission: RE | Admit: 2022-03-11 | Discharge: 2022-03-11 | Disposition: A | Discharge status: Home / Self Care | Payer: Medicare HMO | Source: Ambulatory Visit | Attending: Hematology | Admitting: Hematology

## 2022-03-11 ENCOUNTER — Other Ambulatory Visit: Payer: Self-pay

## 2022-03-11 DIAGNOSIS — C7951 Secondary malignant neoplasm of bone: Secondary | ICD-10-CM | POA: Diagnosis not present

## 2022-03-11 DIAGNOSIS — K7689 Other specified diseases of liver: Secondary | ICD-10-CM | POA: Diagnosis not present

## 2022-03-11 DIAGNOSIS — C61 Malignant neoplasm of prostate: Secondary | ICD-10-CM | POA: Diagnosis not present

## 2022-03-11 DIAGNOSIS — N400 Enlarged prostate without lower urinary tract symptoms: Secondary | ICD-10-CM | POA: Diagnosis not present

## 2022-03-11 MED ORDER — PIFLIFOLASTAT F 18 (PYLARIFY) INJECTION
9.0000 | Freq: Once | INTRAVENOUS | Status: AC
  Administered 2022-03-11: 9 via INTRAVENOUS

## 2022-03-19 ENCOUNTER — Other Ambulatory Visit: Payer: Self-pay

## 2022-03-19 ENCOUNTER — Inpatient Hospital Stay (HOSPITAL_BASED_OUTPATIENT_CLINIC_OR_DEPARTMENT_OTHER): Payer: Medicare HMO | Admitting: Hematology

## 2022-03-19 VITALS — BP 143/81 | HR 74 | Temp 98.1°F | Resp 18 | Ht 74.0 in | Wt 176.9 lb

## 2022-03-19 DIAGNOSIS — Z8546 Personal history of malignant neoplasm of prostate: Secondary | ICD-10-CM | POA: Diagnosis not present

## 2022-03-19 DIAGNOSIS — D473 Essential (hemorrhagic) thrombocythemia: Secondary | ICD-10-CM | POA: Diagnosis not present

## 2022-03-19 DIAGNOSIS — Z86711 Personal history of pulmonary embolism: Secondary | ICD-10-CM | POA: Diagnosis not present

## 2022-03-19 DIAGNOSIS — C61 Malignant neoplasm of prostate: Secondary | ICD-10-CM

## 2022-03-19 DIAGNOSIS — D649 Anemia, unspecified: Secondary | ICD-10-CM | POA: Diagnosis not present

## 2022-03-19 DIAGNOSIS — M899 Disorder of bone, unspecified: Secondary | ICD-10-CM | POA: Diagnosis not present

## 2022-03-19 DIAGNOSIS — Z9079 Acquired absence of other genital organ(s): Secondary | ICD-10-CM | POA: Diagnosis not present

## 2022-03-19 DIAGNOSIS — C9000 Multiple myeloma not having achieved remission: Secondary | ICD-10-CM | POA: Diagnosis not present

## 2022-03-19 DIAGNOSIS — E8809 Other disorders of plasma-protein metabolism, not elsewhere classified: Secondary | ICD-10-CM | POA: Diagnosis not present

## 2022-03-19 DIAGNOSIS — Z79899 Other long term (current) drug therapy: Secondary | ICD-10-CM | POA: Diagnosis not present

## 2022-03-19 DIAGNOSIS — Z800 Family history of malignant neoplasm of digestive organs: Secondary | ICD-10-CM | POA: Diagnosis not present

## 2022-03-19 NOTE — Patient Instructions (Addendum)
[place] at [facility] ?Discharge Instructions ? ? ?You were seen and examined today by Dr. Delton Coombes. ? ?He reviewed the results of your PET scan and lab work.  The scan showed you have multiple lesions on your bones. Your blood work is also indicative of a blood cancer called multiple myeloma. We will need to get a bone marrow biopsy to confirm this diagnosis. We will also need to get a biopsy of the left hip bone to confirm whether what is causing this to light up on the PET scan is multiple myeloma or prostate cancer.  ? ?We will discuss treatment options for multiple myeloma after bone marrow biopsy. ? ?Try to drink plenty of fluids to help improve your kidney function. Your kidney numbers are elevated today.  ? ?Return as scheduled after bone marrow biopsy and biopsy of right hip.  ? ? ? ? ?Thank you for choosing [place] at [facility] to provide your oncology and hematology care.  To afford each patient quality time with our provider, please arrive at least 15 minutes before your scheduled appointment time.  ? ?If you have a lab appointment with the [facility] please come in thru the Main Entrance and check in at the main information desk. ? ?You need to re-schedule your appointment should you arrive 10 or more minutes late.  We strive to give you quality time with our providers, and arriving late affects you and other patients whose appointments are after yours.  Also, if you no show three or more times for appointments you may be dismissed from the clinic at the providers discretion.     ?Again, thank you for choosing [facility].  Our hope is that these requests will decrease the amount of time that you wait before being seen by our physicians.       ?_____________________________________________________________ ? ?Should you have questions after your visit to [facility], please contact our office at [phone] and  follow the prompts.  Our office hours are 8:00 a.m. and 4:30 p.m. Monday - Friday.  Please note that voicemails left after 4:00 p.m. may not be returned until the following business day.  We are closed weekends and major holidays.  You do have access to a nurse 24-7, just call the main number to the clinic [phone] and do not press any options, hold on the line and a nurse will answer the phone.   ? ?For prescription refill requests, have your pharmacy contact our office and allow 72 hours.   ? ?Due to Covid, you will need to wear a mask upon entering the hospital. If you do not have a mask, a mask will be given to you at the Main Entrance upon arrival. For doctor visits, patients may have 1 support person age 7 or older with them. For treatment visits, patients can not have anyone with them due to social distancing guidelines and our immunocompromised population.  ? ?   ?

## 2022-03-19 NOTE — Progress Notes (Signed)
? ?[place] ?[address] ?[place], [place] [address] ? ? ?CLINIC:  ?Medical Oncology/Hematology ? ?PCP:  ?Lindell Spar, MD ?[address] / [place] [address] ?[phone] ? ? ?REASON FOR VISIT:  ?Follow-up for prostate cancer ? ?PRIOR THERAPY: none ? ?NGS Results: not done ? ?CURRENT THERAPY: under work-up ? ?BRIEF ONCOLOGIC HISTORY:  ?Oncology History  ? No history exists.  ? ? ?CANCER STAGING: ?Cancer Staging  ?No matching staging information was found for the patient. ? ?INTERVAL HISTORY:  ?Mr. [name], a 71 y.o. male, returns for routine follow-up of his prostate cancer. Kirin was last seen on 02/26/2022.  ? ?Today he reports feeling good. He reports stable occasional back pain radiating into his legs which he reports has been present for more than 10 years. He reports history of numbness in his legs starting 25 years ago. 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  ? ?REVIEW OF SYSTEMS:  ?Review of Systems  ?Constitutional:  Negative for appetite change and fatigue.  ?Musculoskeletal:  Positive for arthralgias (5/10 legs) and back pain (5/10).  ?Psychiatric/Behavioral:  Positive for sleep disturbance.   ?All other systems reviewed and are negative. ? ?PAST MEDICAL/SURGICAL HISTORY:  ?Past Medical History:  ?Diagnosis Date  ? Arthritis   ? Asbestosis ([place])   ? Closed fracture of distal end of fibula with tibia with routine healing 05/28/2020  ? COVID-19 09/03/2021  ? Cyst of kidney, acquired   ? Headache   ? History of fracture of leg   ? Right, childhood  ? Hx of migraine headaches   ? Hyperlipidemia   ? Neuropathy   ? PE (pulmonary thromboembolism) ([place])   ? After knee surgery  ? Prostate cancer ([place]) 2015  ? Adenocarcinoma by biopsy  ? ?Past Surgical History:  ?Procedure Laterality Date  ? BIOPSY PROSTATE  2003 and 2005  ? COLONOSCOPY  11/28/2008  ? Dr. Rourk:internal  hemorrhoids/tortus colon/ascending colon polyps, tubular adenomas  ? COLONOSCOPY N/A 09/04/2014  ? Procedure: COLONOSCOPY;  Surgeon: Daneil Dolin, MD;  [facility];  Service: Endoscopy;  Laterality: N/A;  9:30  ? COLONOSCOPY N/A 07/17/2017  ? Procedure: COLONOSCOPY;  Surgeon: Danie Binder, MD;  [facility];  Service: Endoscopy;  Laterality: N/A;  ? ESOPHAGOGASTRODUODENOSCOPY N/A 07/16/2017  ? Procedure: ESOPHAGOGASTRODUODENOSCOPY (EGD);  Surgeon: Danie Binder, MD;  [facility];  Service: Endoscopy;  Laterality: N/A;  ? GIVENS CAPSULE STUDY  07/16/2017  ? Procedure: GIVENS CAPSULE STUDY;  Surgeon: Danie Binder, MD;  [facility];  Service: Endoscopy;;  ? JOINT REPLACEMENT N/A   ? Phreesia 01/12/2021  ? POLYPECTOMY  07/17/2017  ? Procedure: POLYPECTOMY;  Surgeon: Danie Binder, MD;  [facility]: [facility];  Service: Endoscopy;;  cecal  ? TOTAL KNEE ARTHROPLASTY Right 06/10/2017  ? TOTAL KNEE ARTHROPLASTY Right 06/10/2017  ? Procedure: TOTAL KNEE ARTHROPLASTY;  Surgeon: [name], MD;  Location: [facility];  Service: Orthopedics;  Laterality: Right;  ? ? ?SOCIAL HISTORY:  ?Social History  ? ?Socioeconomic History  ? Marital status: Married  ?  Spouse name: Not on file  ? Number of children: Not on file  ? Years of education: Not on file  ? Highest education level: Not on file  ?Occupational History  ? Occupation: Retired  ?Tobacco Use  ? Smoking status: Never  ? Smokeless tobacco: Never  ?  Vaping Use  ? Vaping Use: Never used  ?Substance and Sexual Activity  ? Alcohol use: No  ? Drug use: No  ? Sexual activity: Yes  ?Other Topics Concern  ? Not on file  ?Social History Narrative  ? Not on file  ? ?Social Determinants of Health  ? ?Financial Resource Strain: Not on file  ?Food Insecurity: Not on file  ?Transportation Needs: No Transportation Needs  ? Lack of Transportation (Medical): No  ? Lack of Transportation (Non-Medical): No  ?Physical Activity: Sufficiently  Active  ? Days of Exercise per Week: 4 days  ? Minutes of Exercise per Session: 60 min  ?Stress: Not on file  ?Social Connections: Moderately Integrated  ? Frequency of Communication with Friends and Family: More than three times a week  ? Frequency of Social Gatherings with Friends and Family: More than three times a week  ? Attends Religious Services: More than 4 times per year  ? Active Member of Clubs or Organizations: No  ? Attends Archivist Meetings: Never  ? Marital Status: Married  ?Intimate Partner Violence: Not At Risk  ? Fear of Current or Ex-Partner: No  ? Emotionally Abused: No  ? Physically Abused: No  ? Sexually Abused: No  ? ? ?FAMILY HISTORY:  ?Family History  ?Problem Relation Age of Onset  ? Diabetes Mother   ? Hypertension Mother   ? Obesity Mother   ? Heart disease Mother   ? Mental illness Mother   ? Hypertension Sister   ? Obesity Sister   ? Arthritis Sister   ? Deep vein thrombosis Father   ? Dementia Father   ? Heart disease Father   ?     CABG  ? Colon cancer Neg Hx   ? ? ?CURRENT MEDICATIONS:  ?Current Outpatient Medications  ?Medication Sig Dispense Refill  ? acetaminophen (TYLENOL) 500 MG tablet Take 1,000 mg by mouth every 6 (six) hours as needed for mild pain or moderate pain.    ? benzonatate (TESSALON) 100 MG capsule Take 1 capsule (100 mg total) by mouth 2 (two) times daily as needed for cough. 20 capsule 0  ? Cholecalciferol (VITAMIN D) 2000 units CAPS Take 2,000 Units by mouth 3 (three) times a week.     ? methocarbamol (ROBAXIN) 500 MG tablet Take 1 tablet (500 mg total) by mouth 3 (three) times daily. 60 tablet 1  ? Multiple Vitamin (MULTIVITAMIN WITH MINERALS) TABS tablet Take 1 tablet by mouth daily. Centrum Silver for Men    ? nitroGLYCERIN (NITROSTAT) 0.4 MG SL tablet Place 1 tablet (0.4 mg total) under the tongue every 5 (five) minutes as needed for chest pain. 30 tablet 0  ? pantoprazole (PROTONIX) 40 MG tablet Take 1 tablet (40 mg total) by mouth every other  day. 90 tablet 0  ? ?No current facility-administered medications for this visit.  ? ? ?ALLERGIES:  ?Allergies  ?Allergen Reactions  ? Sulfa Antibiotics Other (See Comments)  ?  Unknown reaction. Childhood reaction  ? ? ?PHYSICAL EXAM:  ?Performance status (ECOG): 1 - Symptomatic but completely ambulatory ? ?Vitals:  ? 03/19/22 1312  ?BP: (!) 143/81  ?Pulse: 74  ?Resp: 18  ?Temp: 98.1 ?F (36.7 ?C)  ?SpO2: 100%  ? ?Wt Readings from Last 3 Encounters:  ?03/19/22 176 lb 14.4 oz (80.2 kg)  ?02/26/22 180 lb 9.6 oz (81.9 kg)  ?02/12/22 181 lb 3.2 oz (82.2 kg)  ? ?Physical Exam ?Vitals reviewed.  ?Constitutional:   ?  Appearance: Normal appearance.  ?Cardiovascular:  ?   Rate and Rhythm: Normal rate and regular rhythm.  ?   Pulses: Normal pulses.  ?   Heart sounds: Normal heart sounds.  ?Pulmonary:  ?   Effort: Pulmonary effort is normal.  ?   Breath sounds: Normal breath sounds.  ?Neurological:  ?   General: No focal deficit present.  ?   Mental Status: He is alert and oriented to person, place, and time.  ?Psychiatric:     ?   Mood and Affect: Mood normal.     ?   Behavior: Behavior normal.  ?  ? ?LABORATORY DATA:  ?I have reviewed the labs as listed.  ? ?  Latest Ref Rng & Units 02/06/2022  ?  9:40 AM 01/11/2021  ?  8:52 AM 11/16/2019  ?  9:13 AM  ?CBC  ?WBC 3.4 - 10.8 x10E3/uL 4.8   3.9   3.5    ?Hemoglobin 13.0 - 17.7 g/dL 11.8   13.3   14.0    ?Hematocrit 37.5 - 51.0 % 34.0   39.6   42.0    ?Platelets 150 - 450 x10E3/uL 168   142   170    ? ? ?  Latest Ref Rng & Units 02/06/2022  ?  9:40 AM 11/22/2021  ?  1:09 PM 10/21/2021  ? 10:50 AM  ?CMP  ?Glucose 70 - 99 mg/dL 88   86   95    ?BUN 8 - 27 mg/dL '18   15   16    '$ ?Creatinine 0.76 - 1.27 mg/dL 1.53   1.23   1.35    ?Sodium 134 - 144 mmol/L 138   139   140    ?Potassium 3.5 - 5.2 mmol/L 4.3   4.4   4.2    ?Chloride 96 - 106 mmol/L 103   105   106    ?CO2 20 - 29 mmol/L '27   29   27    '$ ?Calcium 8.6 - 10.2 mg/dL 9.1   9.4   9.4    ?Total Protein 6.0 - 8.5 g/dL 9.9       ?Total Bilirubin 0.0 - 1.2 mg/dL 0.3      ?Alkaline Phos 44 - 121 IU/L 93      ?AST 0 - 40 IU/L 22      ?ALT 0 - 44 IU/L 12      ? ? ?DIAGNOSTIC IMAGING:  ?I have independently reviewed the scans and discussed with the p

## 2022-03-20 ENCOUNTER — Other Ambulatory Visit: Payer: Self-pay

## 2022-03-20 ENCOUNTER — Inpatient Hospital Stay: Payer: Medicare HMO

## 2022-03-20 ENCOUNTER — Encounter (HOSPITAL_COMMUNITY): Payer: Self-pay

## 2022-03-20 ENCOUNTER — Inpatient Hospital Stay: Payer: Medicare HMO | Attending: Hematology | Admitting: Hematology and Oncology

## 2022-03-20 VITALS — BP 131/76 | HR 68 | Temp 98.3°F | Resp 18

## 2022-03-20 DIAGNOSIS — C61 Malignant neoplasm of prostate: Secondary | ICD-10-CM | POA: Diagnosis not present

## 2022-03-20 DIAGNOSIS — D472 Monoclonal gammopathy: Secondary | ICD-10-CM | POA: Insufficient documentation

## 2022-03-20 DIAGNOSIS — C9000 Multiple myeloma not having achieved remission: Secondary | ICD-10-CM | POA: Diagnosis not present

## 2022-03-20 DIAGNOSIS — D696 Thrombocytopenia, unspecified: Secondary | ICD-10-CM | POA: Diagnosis not present

## 2022-03-20 DIAGNOSIS — D649 Anemia, unspecified: Secondary | ICD-10-CM | POA: Diagnosis not present

## 2022-03-20 LAB — CBC WITH DIFFERENTIAL (CANCER CENTER ONLY)
Abs Immature Granulocytes: 0.02 10*3/uL (ref 0.00–0.07)
Basophils Absolute: 0 10*3/uL (ref 0.0–0.1)
Basophils Relative: 1 %
Eosinophils Absolute: 0.2 10*3/uL (ref 0.0–0.5)
Eosinophils Relative: 4 %
HCT: 30 % — ABNORMAL LOW (ref 39.0–52.0)
Hemoglobin: 10.2 g/dL — ABNORMAL LOW (ref 13.0–17.0)
Immature Granulocytes: 1 %
Lymphocytes Relative: 35 %
Lymphs Abs: 1.4 10*3/uL (ref 0.7–4.0)
MCH: 30.4 pg (ref 26.0–34.0)
MCHC: 34 g/dL (ref 30.0–36.0)
MCV: 89.3 fL (ref 80.0–100.0)
Monocytes Absolute: 0.3 10*3/uL (ref 0.1–1.0)
Monocytes Relative: 7 %
Neutro Abs: 2.1 10*3/uL (ref 1.7–7.7)
Neutrophils Relative %: 52 %
Platelet Count: 140 10*3/uL — ABNORMAL LOW (ref 150–400)
RBC: 3.36 MIL/uL — ABNORMAL LOW (ref 4.22–5.81)
RDW: 16.5 % — ABNORMAL HIGH (ref 11.5–15.5)
WBC Count: 4 10*3/uL (ref 4.0–10.5)
nRBC: 0 % (ref 0.0–0.2)

## 2022-03-20 LAB — CBC (CANCER CENTER ONLY)
HCT: 29.9 % — ABNORMAL LOW (ref 39.0–52.0)
Hemoglobin: 10.2 g/dL — ABNORMAL LOW (ref 13.0–17.0)
MCH: 30.5 pg (ref 26.0–34.0)
MCHC: 34.1 g/dL (ref 30.0–36.0)
MCV: 89.5 fL (ref 80.0–100.0)
Platelet Count: 135 10*3/uL — ABNORMAL LOW (ref 150–400)
RBC: 3.34 MIL/uL — ABNORMAL LOW (ref 4.22–5.81)
RDW: 16.2 % — ABNORMAL HIGH (ref 11.5–15.5)
WBC Count: 4 10*3/uL (ref 4.0–10.5)
nRBC: 0 % (ref 0.0–0.2)

## 2022-03-20 NOTE — Progress Notes (Signed)
INDICATION: Monoclonal gammopathy ? ? ?Bone Marrow Biopsy and Aspiration Procedure Note  ? ?Informed consent was obtained and potential risks including bleeding, infection and pain were reviewed with the patient. ? ?The patient's name, date of birth, identification, consent and allergies were verified prior to the start of procedure and time out was performed. ? ?The left posterior iliac crest was chosen as the site of biopsy. ? ?The skin was prepped with ChloraPrep.  ? ?10 cc of 1% lidocaine was used to provide local anaesthesia.  ? ?10 cc of bone marrow aspirate was obtained followed by 1cm biopsy (2 biopsies were obtained).  ?Pressure was applied to the biopsy site and bandage was placed over the biopsy site. ?Patient was made to lie on the back for 15 mins prior to discharge. ? ?The procedure was tolerated well. ?COMPLICATIONS: None ?BLOOD LOSS: 1 cc ?The patient was discharged home in stable condition with a 1 week follow up with Dr. Delton Coombes to review results.  Patient was instructed to call if there was any bleeding or worsening pain. ? ?Specimens sent for flow cytometry, cytogenetics and additional studies. ? ?Signed ?Harriette Ohara, MD  ?

## 2022-03-20 NOTE — Patient Instructions (Signed)
Bone Marrow Aspiration and Bone Marrow Biopsy, Adult, Care After This sheet gives you information about how to care for yourself after your procedure. Your health care provider may also give you more specific instructions. If you have problems or questions, contact your health careprovider. What can I expect after the procedure? After the procedure, it is common to have: Mild pain and tenderness. Swelling. Bruising. Follow these instructions at home: Puncture site care  Follow instructions from your health care provider about how to take care of the puncture site. Make sure you: Wash your hands with soap and water before and after you change your bandage (dressing). If soap and water are not available, use hand sanitizer. Change your dressing as told by your health care provider. Check your puncture site every day for signs of infection. Check for: More redness, swelling, or pain. Fluid or blood. Warmth. Pus or a bad smell.  Activity Return to your normal activities as told by your health care provider. Ask your health care provider what activities are safe for you. Do not lift anything that is heavier than 10 lb (4.5 kg), or the limit that you are told, until your health care provider says that it is safe. Do not drive for 24 hours if you were given a sedative during your procedure. General instructions  Take over-the-counter and prescription medicines only as told by your health care provider. Do not take baths, swim, or use a hot tub until your health care provider approves. Ask your health care provider if you may take showers. You may only be allowed to take sponge baths. If directed, put ice on the affected area. To do this: Put ice in a plastic bag. Place a towel between your skin and the bag. Leave the ice on for 20 minutes, 2-3 times a day. Keep all follow-up visits as told by your health care provider. This is important.  Contact a health care provider if: Your pain is not  controlled with medicine. You have a fever. You have more redness, swelling, or pain around the puncture site. You have fluid or blood coming from the puncture site. Your puncture site feels warm to the touch. You have pus or a bad smell coming from the puncture site. Summary After the procedure, it is common to have mild pain, tenderness, swelling, and bruising. Follow instructions from your health care provider about how to take care of the puncture site and what activities are safe for you. Take over-the-counter and prescription medicines only as told by your health care provider. Contact a health care provider if you have any signs of infection, such as fluid or blood coming from the puncture site. This information is not intended to replace advice given to you by your health care provider. Make sure you discuss any questions you have with your healthcare provider. Document Revised: 04/26/2019 Document Reviewed: 04/26/2019 Elsevier Patient Education  2022 [employer]  

## 2022-03-24 NOTE — Progress Notes (Incomplete)
History of Present Illness:  ? ?Prior history of elevated PSA--s/p TRUS/Bx in [place] in 2005 and 2008. Apparently, all cores were negative but some were not indicative of prostate tissue at all.  ? ?Because of a high PCA 3 test in this office, he underwent TRUS/Bx on 6.23.2016. 1/12 cores were positive for adenocarcinoma-10% of the core in the right lateral base revealed Gleason 3+3 equal 6 adenocarcinoma. Prostate volume was 175 cc. PSA was 16.66. PSA density 0.09. He does not have significant lower urinary tract symptomatology.  ? ?He consulted with Dr. Louis Meckel in [place] to consider robotic prostatectomy. Due to his low volume prostate cancer, and very low PSA density, it was recommended that he continue with active surveillance.  ? ?1.30.2017: Surveillance TRUS/Bx , following a prostatic MRI which revealed no suspicious prostatic lesions and correlated the patient's large prostate. At that time, all 12 cores were negative for prostate cancer. Prostatic volume was 190 mL.  ? ?8.21.2019: fusion TRUS/Bx. Recent prostate MRI--volume 220 ml. No significant lesions. No evidence transcapsular/perineural,SV/bony or lymphatic disease. Path--1 core (left apex) revealed GS 3+3 pattern in 5% of core.  ?  ?PSA followup: ?  ?10.18.2016--17.16 ?11.14.2017--15.4 ?5.25.2018--17.9 ?4.29.2019--18.4 ?2.25.2020--15.9 ?3.30.2021--15.5 ?3.30.2022--22.4 ?9.24.2021 MRI prostate. Prostate volume 250 mL. ?No significant change compared to prior exam. No radiographic ?evidence of high-grade prostate carcinoma. PI-RADS 2: Low ?(clinically significant cancer is unlikely to be present) ?4.5.2022: PSA 22.4. ?10.4.2022:  PSA 34.5 this was drawn while the patient was being treated for an active COVID infection.  He was put on finasteride after his last visit.  He was unable to tolerate it due to worsening of his lower urinary tract symptoms. ? ?4.4.2023: PSA 21.10. ? ?Past Medical History:  ?Diagnosis Date  ? Arthritis   ? Asbestosis  ([place])   ? Closed fracture of distal end of fibula with tibia with routine healing 05/28/2020  ? COVID-19 09/03/2021  ? Cyst of kidney, acquired   ? Headache   ? History of fracture of leg   ? Right, childhood  ? Hx of migraine headaches   ? Hyperlipidemia   ? Neuropathy   ? PE (pulmonary thromboembolism) ([place])   ? After knee surgery  ? Prostate cancer ([place]) 2015  ? Adenocarcinoma by biopsy  ? ? ?Past Surgical History:  ?Procedure Laterality Date  ? BIOPSY PROSTATE  2003 and 2005  ? COLONOSCOPY  11/28/2008  ? Dr. Rourk:internal hemorrhoids/tortus colon/ascending colon polyps, tubular adenomas  ? COLONOSCOPY N/A 09/04/2014  ? Procedure: COLONOSCOPY;  Surgeon: Daneil Dolin, MD;  [facility];  Service: Endoscopy;  Laterality: N/A;  9:30  ? COLONOSCOPY N/A 07/17/2017  ? Procedure: COLONOSCOPY;  Surgeon: Danie Binder, MD;  [facility];  Service: Endoscopy;  Laterality: N/A;  ? ESOPHAGOGASTRODUODENOSCOPY N/A 07/16/2017  ? Procedure: ESOPHAGOGASTRODUODENOSCOPY (EGD);  Surgeon: Danie Binder, MD;  [facility];  Service: Endoscopy;  Laterality: N/A;  ? GIVENS CAPSULE STUDY  07/16/2017  ? Procedure: GIVENS CAPSULE STUDY;  Surgeon: Danie Binder, MD;  [facility];  Service: Endoscopy;;  ? JOINT REPLACEMENT N/A   ? Phreesia 01/12/2021  ? POLYPECTOMY  07/17/2017  ? Procedure: POLYPECTOMY;  Surgeon: Danie Binder, MD;  [facility];  Service: Endoscopy;;  cecal  ? TOTAL KNEE ARTHROPLASTY Right 06/10/2017  ? TOTAL KNEE ARTHROPLASTY Right 06/10/2017  ? Procedure: TOTAL KNEE ARTHROPLASTY;  Surgeon: [name], MD;  Location: [facility];  Service: Orthopedics;  Laterality: Right;  ? ? ?  Home Medications:  ?Allergies as of 03/25/2022   ? ?   Reactions  ? Sulfa Antibiotics Other (See Comments)  ? Unknown reaction. Childhood reaction  ? ?  ? ?  ?Medication List  ?  ? ?  ? Accurate as of March 24, 2022  7:56 PM. If you have any questions, ask your nurse or doctor.  ?  ?  ? ?   ? ?acetaminophen 500 MG tablet ?Commonly known as: TYLENOL ?Take 1,000 mg by mouth every 6 (six) hours as needed for mild pain or moderate pain. ?  ?benzonatate 100 MG capsule ?Commonly known as: TESSALON ?Take 1 capsule (100 mg total) by mouth 2 (two) times daily as needed for cough. ?  ?methocarbamol 500 MG tablet ?Commonly known as: ROBAXIN ?Take 1 tablet (500 mg total) by mouth 3 (three) times daily. ?  ?multivitamin with minerals Tabs tablet ?Take 1 tablet by mouth daily. Centrum Silver for Men ?  ?nitroGLYCERIN 0.4 MG SL tablet ?Commonly known as: NITROSTAT ?Place 1 tablet (0.4 mg total) under the tongue every 5 (five) minutes as needed for chest pain. ?  ?pantoprazole 40 MG tablet ?Commonly known as: PROTONIX ?Take 1 tablet (40 mg total) by mouth every other day. ?  ?Vitamin D 50 MCG (2000 UT) Caps ?Take 2,000 Units by mouth 3 (three) times a week. ?  ? ?  ? ? ?Allergies:  ?Allergies  ?Allergen Reactions  ? Sulfa Antibiotics Other (See Comments)  ?  Unknown reaction. Childhood reaction  ? ? ?Family History  ?Problem Relation Age of Onset  ? Diabetes Mother   ? Hypertension Mother   ? Obesity Mother   ? Heart disease Mother   ? Mental illness Mother   ? Hypertension Sister   ? Obesity Sister   ? Arthritis Sister   ? Deep vein thrombosis Father   ? Dementia Father   ? Heart disease Father   ?     CABG  ? Colon cancer Neg Hx   ? ? ?Social History:  reports that he has never smoked. He has never used smokeless tobacco. He reports that he does not drink alcohol and does not use drugs. ? ?ROS: ?A complete review of systems was performed.  All systems are negative except for pertinent findings as noted. ? ?Physical Exam:  ?Vital signs in last 24 hours: ?There were no vitals taken for this visit. ?Constitutional:  Alert and oriented, No acute distress ?Cardiovascular: Regular rate  ?Respiratory: Normal respiratory effort ?GI: Abdomen is soft, nontender, nondistended, no abdominal masses. No CVAT.  ?Genitourinary:  Normal male phallus, testes are descended bilaterally and non-tender and without masses, scrotum is normal in appearance without lesions or masses, perineum is normal on inspection. ?Lymphatic: No lymphadenopathy ?Neurologic: Grossly intact, no focal deficits ?Psychiatric: Normal mood and affect ? ?I have reviewed prior pt notes ? ?I have reviewed urinalysis results ? ?I have independently reviewed prior imaging--MRI's, U/S volumes ? ?I have reviewed prior PSA results ? ? ? ?Impression/Assessment:  ?*** ? ?Plan:  ?*** ? ?

## 2022-03-25 ENCOUNTER — Ambulatory Visit: Payer: Medicare HMO | Admitting: Urology

## 2022-03-25 DIAGNOSIS — C61 Malignant neoplasm of prostate: Secondary | ICD-10-CM

## 2022-03-25 DIAGNOSIS — N401 Enlarged prostate with lower urinary tract symptoms: Secondary | ICD-10-CM

## 2022-03-31 ENCOUNTER — Encounter (HOSPITAL_COMMUNITY): Payer: Self-pay | Admitting: Hematology

## 2022-04-01 ENCOUNTER — Encounter (HOSPITAL_COMMUNITY): Payer: Self-pay | Admitting: Hematology

## 2022-04-03 ENCOUNTER — Encounter (HOSPITAL_COMMUNITY)
Admission: RE | Admit: 2022-04-03 | Discharge: 2022-04-03 | Disposition: A | Discharge status: Home / Self Care | Payer: Medicare HMO | Source: Ambulatory Visit | Attending: Hematology | Admitting: Hematology

## 2022-04-03 DIAGNOSIS — C9000 Multiple myeloma not having achieved remission: Secondary | ICD-10-CM

## 2022-04-03 MED ORDER — FLUDEOXYGLUCOSE F - 18 (FDG) INJECTION
9.6300 | Freq: Once | INTRAVENOUS | Status: AC | PRN
  Administered 2022-04-03: 9.63 via INTRAVENOUS

## 2022-04-07 LAB — SURGICAL PATHOLOGY

## 2022-04-09 ENCOUNTER — Encounter (HOSPITAL_COMMUNITY): Payer: Self-pay | Admitting: Hematology

## 2022-04-09 ENCOUNTER — Inpatient Hospital Stay (HOSPITAL_COMMUNITY): Payer: Medicare HMO | Attending: Hematology | Admitting: Hematology

## 2022-04-09 VITALS — BP 148/81 | HR 85 | Temp 97.7°F | Resp 18 | Ht 74.0 in | Wt 174.3 lb

## 2022-04-09 DIAGNOSIS — N189 Chronic kidney disease, unspecified: Secondary | ICD-10-CM | POA: Diagnosis not present

## 2022-04-09 DIAGNOSIS — C7951 Secondary malignant neoplasm of bone: Secondary | ICD-10-CM | POA: Insufficient documentation

## 2022-04-09 DIAGNOSIS — D631 Anemia in chronic kidney disease: Secondary | ICD-10-CM | POA: Diagnosis not present

## 2022-04-09 DIAGNOSIS — Z5112 Encounter for antineoplastic immunotherapy: Secondary | ICD-10-CM | POA: Diagnosis present

## 2022-04-09 DIAGNOSIS — Z808 Family history of malignant neoplasm of other organs or systems: Secondary | ICD-10-CM | POA: Insufficient documentation

## 2022-04-09 DIAGNOSIS — C9000 Multiple myeloma not having achieved remission: Secondary | ICD-10-CM | POA: Diagnosis not present

## 2022-04-09 DIAGNOSIS — Z8616 Personal history of COVID-19: Secondary | ICD-10-CM | POA: Insufficient documentation

## 2022-04-09 DIAGNOSIS — Z8042 Family history of malignant neoplasm of prostate: Secondary | ICD-10-CM | POA: Diagnosis not present

## 2022-04-09 DIAGNOSIS — C61 Malignant neoplasm of prostate: Secondary | ICD-10-CM | POA: Diagnosis not present

## 2022-04-09 NOTE — Patient Instructions (Addendum)
[facility] ?Discharge Instructions ? ?You were seen and examined today by Dr. Delton Coombes. Dr. Delton Coombes reviewed your most recent PET scan and bone marrow biopsy results. ? ?The PET scan clarified that the lesions present on your bones are most likely coming from Stage IV Prostate Cancer. This is not curable because it has spread to the bones. Dr. Delton Coombes has recommended a shot known as Lupron, this is given once every 6 months but you will need a loading dose shot first. ? ?You have also been diagnosed with Multiple Myeloma. Myeloma cells are impacting approximately 70% of your bone marrow. This is a blood cancer that also cannot be cured. This is the priority to treat, because it does have high risk features. This is treated with a 4 drug regimen. Multiple Myeloma can be treated with a bone marrow (stem cell) transplant. Your diagnosis of prostate cancer may complicate this, but Dr. Delton Coombes will also discuss your care with the transplant provider at [facility].  ? ?Both of these conditions are treatable and patients often survive years while they are on treatment. ? ?Call the clinic with any questions at [phone]. ? ?Please follow-up as scheduled. ? ? ?Thank you for choosing [place] at [facility] to provide your oncology and hematology care.  To afford each patient quality time with our provider, please arrive at least 15 minutes before your scheduled appointment time.  ? ?If you have a lab appointment with the [facility] please come in thru the Main Entrance and check in at the main information desk. ? ?You need to re-schedule your appointment should you arrive 10 or more minutes late.  We strive to give you quality time with our providers, and arriving late affects you and other patients whose appointments are after yours.  Also, if you no show three or more times for appointments you may be dismissed from the clinic  at the providers discretion.     ?Again, thank you for choosing [facility].  Our hope is that these requests will decrease the amount of time that you wait before being seen by our physicians.       ?_____________________________________________________________ ? ?Should you have questions after your visit to [facility], please contact our office at [phone] and follow the prompts.  Our office hours are 8:00 a.m. and 4:30 p.m. Monday - Friday.  Please note that voicemails left after 4:00 p.m. may not be returned until the following business day.  We are closed weekends and major holidays.  You do have access to a nurse 24-7, just call the main number to the clinic [phone] and do not press any options, hold on the line and a nurse will answer the phone.   ? ?For prescription refill requests, have your pharmacy contact our office and allow 72 hours.   ? ?Due to Covid, you will need to wear a mask upon entering the hospital. If you do not have a mask, a mask will be given to you at the Main Entrance upon arrival. For doctor visits, patients may have 1 support person age 72 or older with them. For treatment visits, patients can not have anyone with them due to social distancing guidelines and our immunocompromised population.  ? ? ? ?

## 2022-04-09 NOTE — Progress Notes (Signed)
? ?[facility] ?[address] ?[place], [place] [address] ? ? ?CLINIC:  ?Medical Oncology/Hematology ? ?PCP:  ?Lindell Spar, MD ?[address] / [place] [address] ?[phone] ? ? ?REASON FOR VISIT:  ?Follow-up for prostate cancer and multiple myeloma ? ?PRIOR THERAPY: none ? ?NGS Results: not done ? ?CURRENT THERAPY: under work-up ? ?BRIEF ONCOLOGIC HISTORY:  ?Oncology History  ? No history exists.  ? ? ?CANCER STAGING: ? Cancer Staging  ?No matching staging information was found for the patient. ? ?INTERVAL HISTORY:  ?Luis Stewart, a 71 y.o. male, returns for routine follow-up of his prostate cancer and multiple myeloma. Cache was last seen on 03/29/20223.  ? ?Today he reports feeling good. He reports soreness at the biopsy site. He reports stable back and leg pain which waxes and wanes correlating with activity level.  His weight is stable. He reports stable tingling/numbness occasionally in his fingers and constantly in his toes which has been present for 20 years; he denies associated pain. He denies history of cardiac issues.  ? ?REVIEW OF SYSTEMS:  ?Review of Systems  ?Constitutional:  Negative for appetite change, fatigue and unexpected weight change.  ?Respiratory:  Positive for cough.   ?Musculoskeletal:  Positive for arthralgias (2/10 legs) and myalgias (leg and hand cramping).  ?Neurological:  Positive for headaches and numbness.  ?All other systems reviewed and are negative. ? ?PAST MEDICAL/SURGICAL HISTORY:  ?Past Medical History:  ?Diagnosis Date  ? Arthritis   ? Asbestosis ([place])   ? Closed fracture of distal end of fibula with tibia with routine healing 05/28/2020  ? COVID-19 09/03/2021  ? Cyst of kidney, acquired   ? Headache   ? History of fracture of leg   ? Right, childhood  ? Hx of migraine headaches   ? Hyperlipidemia   ? Neuropathy   ? PE (pulmonary thromboembolism) ([place])   ? After knee surgery  ? Prostate cancer ([place]) 2015  ? Adenocarcinoma by biopsy   ? ?Past Surgical History:  ?Procedure Laterality Date  ? BIOPSY PROSTATE  2003 and 2005  ? COLONOSCOPY  11/28/2008  ? Dr. Rourk:internal hemorrhoids/tortus colon/ascending colon polyps, tubular adenomas  ? COLONOSCOPY N/A 09/04/2014  ? Procedure: COLONOSCOPY;  Surgeon: Daneil Dolin, MD;  [facility];  Service: Endoscopy;  Laterality: N/A;  9:30  ? COLONOSCOPY N/A 07/17/2017  ? Procedure: COLONOSCOPY;  Surgeon: Danie Binder, MD;  [facility];  Service: Endoscopy;  Laterality: N/A;  ? ESOPHAGOGASTRODUODENOSCOPY N/A 07/16/2017  ? Procedure: ESOPHAGOGASTRODUODENOSCOPY (EGD);  Surgeon: Danie Binder, MD;  [facility];  Service: Endoscopy;  Laterality: N/A;  ? GIVENS CAPSULE STUDY  07/16/2017  ? Procedure: GIVENS CAPSULE STUDY;  Surgeon: Danie Binder, MD;  [facility];  Service: Endoscopy;;  ? JOINT REPLACEMENT N/A   ? Phreesia 01/12/2021  ? POLYPECTOMY  07/17/2017  ? Procedure: POLYPECTOMY;  Surgeon: Danie Binder, MD;  [facility];  Service: Endoscopy;;  cecal  ? TOTAL KNEE ARTHROPLASTY Right 06/10/2017  ? TOTAL KNEE ARTHROPLASTY Right 06/10/2017  ? Procedure: TOTAL KNEE ARTHROPLASTY;  Surgeon: [name], MD;  Location: [facility];  Service: Orthopedics;  Laterality: Right;  ? ? ?SOCIAL HISTORY:  ?Social History  ? ?Socioeconomic History  ? Marital status: Married  ?  Spouse name: Not on file  ? Number of children: Not on file  ? Years of education: Not on file  ? Highest education level:  Not on file  ?Occupational History  ? Occupation: Retired  ?Tobacco Use  ? Smoking status: Never  ? Smokeless tobacco: Never  ?Vaping Use  ? Vaping Use: Never used  ?Substance and Sexual Activity  ? Alcohol use: No  ? Drug use: No  ? Sexual activity: Yes  ?Other Topics Concern  ? Not on file  ?Social History Narrative  ? Not on file  ? ?Social Determinants of Health  ? ?Financial Resource Strain: Not on file  ?Food Insecurity: Not on file  ?Transportation Needs: No  Transportation Needs  ? Lack of Transportation (Medical): No  ? Lack of Transportation (Non-Medical): No  ?Physical Activity: Sufficiently Active  ? Days of Exercise per Week: 4 days  ? Minutes of Exercise per Session: 60 min  ?Stress: Not on file  ?Social Connections: Moderately Integrated  ? Frequency of Communication with Friends and Family: More than three times a week  ? Frequency of Social Gatherings with Friends and Family: More than three times a week  ? Attends Religious Services: More than 4 times per year  ? Active Member of Clubs or Organizations: No  ? Attends Archivist Meetings: Never  ? Marital Status: Married  ?Intimate Partner Violence: Not At Risk  ? Fear of Current or Ex-Partner: No  ? Emotionally Abused: No  ? Physically Abused: No  ? Sexually Abused: No  ? ? ?FAMILY HISTORY:  ?Family History  ?Problem Relation Age of Onset  ? Diabetes Mother   ? Hypertension Mother   ? Obesity Mother   ? Heart disease Mother   ? Mental illness Mother   ? Hypertension Sister   ? Obesity Sister   ? Arthritis Sister   ? Deep vein thrombosis Father   ? Dementia Father   ? Heart disease Father   ?     CABG  ? Colon cancer Neg Hx   ? ? ?CURRENT MEDICATIONS:  ?Current Outpatient Medications  ?Medication Sig Dispense Refill  ? acetaminophen (TYLENOL) 500 MG tablet Take 1,000 mg by mouth every 6 (six) hours as needed for mild pain or moderate pain.    ? benzonatate (TESSALON) 100 MG capsule Take 1 capsule (100 mg total) by mouth 2 (two) times daily as needed for cough. 20 capsule 0  ? Cholecalciferol (VITAMIN D) 2000 units CAPS Take 2,000 Units by mouth 3 (three) times a week.     ? methocarbamol (ROBAXIN) 500 MG tablet Take 1 tablet (500 mg total) by mouth 3 (three) times daily. 60 tablet 1  ? Multiple Vitamin (MULTIVITAMIN WITH MINERALS) TABS tablet Take 1 tablet by mouth daily. Centrum Silver for Men    ? nitroGLYCERIN (NITROSTAT) 0.4 MG SL tablet Place 1 tablet (0.4 mg total) under the tongue every 5  (five) minutes as needed for chest pain. 30 tablet 0  ? pantoprazole (PROTONIX) 40 MG tablet Take 1 tablet (40 mg total) by mouth every other day. 90 tablet 0  ? ?No current facility-administered medications for this visit.  ? ? ?ALLERGIES:  ?Allergies  ?Allergen Reactions  ? Sulfa Antibiotics Other (See Comments)  ?  Unknown reaction. Childhood reaction  ? ? ?PHYSICAL EXAM:  ?Performance status (ECOG): 1 - Symptomatic but completely ambulatory ? ?Vitals:  ? 04/09/22 1450  ?BP: (!) 148/81  ?Pulse: 85  ?Resp: 18  ?Temp: 97.7 ?F (36.5 ?C)  ?SpO2: 97%  ? ?Wt Readings from Last 3 Encounters:  ?04/09/22 174 lb 4.8 oz (79.1 kg)  ?03/19/22 176 lb 14.4  oz (80.2 kg)  ?02/26/22 180 lb 9.6 oz (81.9 kg)  ? ?Physical Exam ?Vitals reviewed.  ?Constitutional:   ?   Appearance: Normal appearance.  ?Cardiovascular:  ?   Rate and Rhythm: Normal rate and regular rhythm.  ?   Pulses: Normal pulses.  ?   Heart sounds: Normal heart sounds.  ?Pulmonary:  ?   Effort: Pulmonary effort is normal.  ?   Breath sounds: Normal breath sounds.  ?Neurological:  ?   General: No focal deficit present.  ?   Mental Status: He is alert and oriented to person, place, and time.  ?Psychiatric:     ?   Mood and Affect: Mood normal.     ?   Behavior: Behavior normal.  ?  ? ?LABORATORY DATA:  ?I have reviewed the labs as listed.  ? ?  Latest Ref Rng & Units 03/20/2022  ?  8:15 AM 02/06/2022  ?  9:40 AM 01/11/2021  ?  8:52 AM  ?CBC  ?WBC 4.0 - 10.5 K/uL 4.0    ? 4.0   4.8   3.9    ?Hemoglobin 13.0 - 17.0 g/dL 10.2    ? 10.2   11.8   13.3    ?Hematocrit 39.0 - 52.0 % 29.9    ? 30.0   34.0   39.6    ?Platelets 150 - 400 K/uL 135    ? 140   168   142    ? ? ?  Latest Ref Rng & Units 02/06/2022  ?  9:40 AM 11/22/2021  ?  1:09 PM 10/21/2021  ? 10:50 AM  ?CMP  ?Glucose 70 - 99 mg/dL 88   86   95    ?BUN 8 - 27 mg/dL _0 ?Creatinine 0.76 - 1.27 mg/dL 1.53   1.23   1.35    ?Sodium 134 - 144 mmol/L 138   139   140    ?Potassium 3.5 - 5.2 mmol/L 4.3   4.4   4.2     ?Chloride 96 - 106 mmol/L 103   105   106    ?CO2 20 - 29 mmol/L _1 ?Calcium 8.6 - 10.2 mg/dL 9.1   9.4   9.4    ?Total Protein 6.0 - 8.5 g/dL 9.9      ?Total Bilirubin 0.0 - 1.2 mg/dL 0.3      ?A

## 2022-04-09 NOTE — Progress Notes (Signed)
START ON PATHWAY REGIMEN - Multiple Myeloma and Other Plasma Cell Dyscrasias     A cycle is every 28 days:     Carfilzomib      Carfilzomib      Carfilzomib      Dexamethasone      Dexamethasone      Lenalidomide   **Always confirm dose/schedule in your pharmacy ordering system**  Patient Characteristics: Multiple Myeloma, Newly Diagnosed, Transplant Eligible, High Risk Disease Classification: Multiple Myeloma R-ISS Staging: Unknown Therapeutic Status: Newly Diagnosed Is Patient Eligible for Transplant<= Transplant Eligible Risk Status: High Risk Intent of Therapy: Non-Curative / Palliative Intent, Discussed with Patient 

## 2022-04-10 ENCOUNTER — Other Ambulatory Visit (HOSPITAL_COMMUNITY): Payer: Medicare HMO

## 2022-04-10 ENCOUNTER — Inpatient Hospital Stay (HOSPITAL_COMMUNITY): Payer: Medicare HMO

## 2022-04-10 ENCOUNTER — Other Ambulatory Visit (HOSPITAL_COMMUNITY): Payer: Self-pay

## 2022-04-10 DIAGNOSIS — C9000 Multiple myeloma not having achieved remission: Secondary | ICD-10-CM

## 2022-04-10 DIAGNOSIS — Z8616 Personal history of COVID-19: Secondary | ICD-10-CM | POA: Diagnosis not present

## 2022-04-10 DIAGNOSIS — Z8042 Family history of malignant neoplasm of prostate: Secondary | ICD-10-CM | POA: Diagnosis not present

## 2022-04-10 DIAGNOSIS — C61 Malignant neoplasm of prostate: Secondary | ICD-10-CM

## 2022-04-10 DIAGNOSIS — D631 Anemia in chronic kidney disease: Secondary | ICD-10-CM | POA: Diagnosis not present

## 2022-04-10 DIAGNOSIS — Z5112 Encounter for antineoplastic immunotherapy: Secondary | ICD-10-CM | POA: Diagnosis not present

## 2022-04-10 DIAGNOSIS — C7951 Secondary malignant neoplasm of bone: Secondary | ICD-10-CM | POA: Diagnosis not present

## 2022-04-10 DIAGNOSIS — N189 Chronic kidney disease, unspecified: Secondary | ICD-10-CM | POA: Diagnosis not present

## 2022-04-10 DIAGNOSIS — Z808 Family history of malignant neoplasm of other organs or systems: Secondary | ICD-10-CM | POA: Diagnosis not present

## 2022-04-10 LAB — CBC WITH DIFFERENTIAL/PLATELET
Abs Immature Granulocytes: 0.03 10*3/uL (ref 0.00–0.07)
Basophils Absolute: 0 10*3/uL (ref 0.0–0.1)
Basophils Relative: 1 %
Eosinophils Absolute: 0.2 10*3/uL (ref 0.0–0.5)
Eosinophils Relative: 4 %
HCT: 29.2 % — ABNORMAL LOW (ref 39.0–52.0)
Hemoglobin: 9.9 g/dL — ABNORMAL LOW (ref 13.0–17.0)
Immature Granulocytes: 1 %
Lymphocytes Relative: 32 %
Lymphs Abs: 1.7 10*3/uL (ref 0.7–4.0)
MCH: 31.4 pg (ref 26.0–34.0)
MCHC: 33.9 g/dL (ref 30.0–36.0)
MCV: 92.7 fL (ref 80.0–100.0)
Monocytes Absolute: 0.4 10*3/uL (ref 0.1–1.0)
Monocytes Relative: 8 %
Neutro Abs: 3 10*3/uL (ref 1.7–7.7)
Neutrophils Relative %: 54 %
Platelets: 136 10*3/uL — ABNORMAL LOW (ref 150–400)
RBC: 3.15 MIL/uL — ABNORMAL LOW (ref 4.22–5.81)
RDW: 17.8 % — ABNORMAL HIGH (ref 11.5–15.5)
WBC: 5.4 10*3/uL (ref 4.0–10.5)
nRBC: 0 % (ref 0.0–0.2)

## 2022-04-10 LAB — COMPREHENSIVE METABOLIC PANEL
ALT: 21 U/L (ref 0–44)
AST: 27 U/L (ref 15–41)
Albumin: 3 g/dL — ABNORMAL LOW (ref 3.5–5.0)
Alkaline Phosphatase: 57 U/L (ref 38–126)
Anion gap: 0 — ABNORMAL LOW (ref 5–15)
BUN: 26 mg/dL — ABNORMAL HIGH (ref 8–23)
CO2: 27 mmol/L (ref 22–32)
Calcium: 8.9 mg/dL (ref 8.9–10.3)
Chloride: 107 mmol/L (ref 98–111)
Creatinine, Ser: 2.07 mg/dL — ABNORMAL HIGH (ref 0.61–1.24)
GFR, Estimated: 34 mL/min — ABNORMAL LOW (ref >60)
Glucose, Bld: 104 mg/dL — ABNORMAL HIGH (ref 70–99)
Potassium: 3.7 mmol/L (ref 3.5–5.1)
Sodium: 134 mmol/L — ABNORMAL LOW (ref 135–145)
Total Bilirubin: 0.9 mg/dL (ref 0.3–1.2)
Total Protein: 11.8 g/dL — ABNORMAL HIGH (ref 6.5–8.1)

## 2022-04-10 LAB — PSA: Prostatic Specific Antigen: 17.87 ng/mL — ABNORMAL HIGH (ref 0.00–4.00)

## 2022-04-10 NOTE — Progress Notes (Signed)
Lab orders placed per verbal order from Dr. Katragadda 

## 2022-04-10 NOTE — Patient Instructions (Addendum)
[place] ?Chemotherapy Teaching ? ? ?You are diagnosed with multiple myeloma. We will treat you in the clinic weekly days with with a combination of drugs.  Those drugs are Kyprolis, dexamethasone, and Darzalex. We will do this for 2 cycles. Then you will come weekly for 3 weeks in a row with one week off to receive treatment. The intent of treatment is to control this disease, prevent it from getting worse, and to alleviate any symptoms you may be having related to the disease. You will see the doctor regularly throughout treatment.  We will obtain blood work from you prior to every treatment and monitor your results to make sure it is safe to give your treatment. The doctor monitors your response to treatment by the way you are feeling, your blood work, and by obtaining scans periodically.  There will be wait times while you are here for treatment.  It will take about 30 minutes to 1 hour for your lab work to result.  Then there will be wait times while pharmacy mixes your medications.   ? ? ? ?Carfilzomib (Kyprolis)   ? ?About This Drug  ? ?Carfilzomib is used to treat cancer. It is given in the vein (IV). It takes 30 minutes to infuse.  ? ?Possible Side Effects  ? A decrease in the number of red blood cells and platelets. This may make you tired and weak and raise your risk of bleeding.  ? ? Nausea  ? ? Diarrhea (loose bowel movements)  ? ? Tiredness  ? ? Swelling of your legs, ankles and/or feet  ? ? Fever  ? ? Headache  ? ? Cough and trouble breathing  ? ?Note: Each of the side effects above was reported in greater than 20% of patients treated with carfilzomib alone. Not all possible side effects are included above.  ? ?Warnings and Precautions  ? ? Congestive heart failure and/or risk of heart attack, which can be life-threatening. Congestive heart failure is when our heart is not pumping blood as well as it should be, and fluid can build up in your body.  ? ? Changes in your kidney function,  which can cause kidney failure and be life-threatening.  ? ? Tumor lysis syndrome: This drug may act on the cancer cells very quickly. This may affect how your kidneys work.  ? ? Inflammation (swelling) and/or scarring of the lungs, which can be life-threatening. You may have a cough and/or trouble breathing.  ? ? Pulmonary hypertension - a serious condition involving increased blood pressure in the arteries of your lungs.  ? ? High blood pressure, which can be life-threatening.  ? ? Risk of blood clots. A blood clot in your leg may cause your leg to swell, appear red and warm, and/ or cause pain. A blood clot in your lungs may cause trouble breathing, pain when breathing, and/or chest pain. If you use hormonal contraception, discuss choice of contraception and risk of blood clots with your medical team.  ? ? While you are getting this drug in your vein (IV), you may have a reaction to the drug, which can be life-threatening. Sometimes you may be given medication to stop or lessen these side effects. Your nurse will check you closely for these signs: fever or shaking chills, flushing, facial swelling, feeling dizzy, headache, trouble breathing, rash, itching, chest tightness, or chest pain. These reactions may happen after your infusion. If this happens, call 911 for emergency care.  ? ? Abnormal  bleeding which can be life-threatening - symptoms may be coughing up blood, throwing up blood (may look like coffee grounds), red or black tarry bowel movements, abnormally heavy menstrual flow, nosebleeds, or any other unusual bleeding.  ? ? Severe decrease in platelets, which can increase your risk of bleeding.  ? ? Damage to small blood vessels which can cause bleeding and blood clots and can be lifethreatening.  ? ? Changes in your liver function, which can cause liver failure and be life-threatening.  ? ? Swelling in the brain that is usually reversible. Symptoms can be sudden (acute) and may include a headache,  confusion, changes in eyesight, extreme tiredness/coma, and/or seizures. If you start to have any of these symptoms let your doctor know right away.  ? ? A rare virus can cause an infection that affects your central nervous system and can be lifethreatening. The central nervous system is made up of your brain and spinal cord. You could feel extreme tiredness, agitation, confusion, have trouble understanding or speaking, trouble thinking and/or memory loss, difficulty walking, eyesight changes, numbness or lack of strength to your arms, legs, or seizures. If you start to have any of these symptoms let your doctor know right away. ? ? Note: Some of the side effects above are very rare. If you have concerns and/or questions, please discuss them with your medical team.  ? ?Important Information  ? ? This drug may be present in the saliva, tears, sweat, urine, stool, vomit, semen, and vaginal secretions. Talk to your doctor and/or your nurse about the necessary precautions to take during this time.  ? ? This drug may impair your ability to drive or use machinery. Use caution and tell your nurse or doctor if you feel dizzy, very sleepy, and/or experience low blood pressure.  ? ?Treating Side Effects  ? ? Manage tiredness by pacing your activities for the day.  ? ? Be sure to include periods of rest between energy-draining activities.  ? ? To help decrease the risk of bleeding, use a soft toothbrush. Check with your nurse before using dental floss.  ? ? Be very careful when using knives or tools.  ? ? Use an electric shaver instead of a razor.  ? ? Drink plenty of fluids (a minimum of eight glasses per day is recommended).   ? ? If you throw up or have diarrhea, you should drink more fluids so that you do not become dehydrated (lack of water in the body from losing too much fluid).  ? ? To help with nausea and vomiting, eat small, frequent meals instead of three large meals a day. Choose foods and drinks that are at room  temperature. Ask your nurse or doctor about other helpful tips and medicine that is available to help stop or lessen these symptoms.  ? ? If you have diarrhea, eat low-fiber foods that are high in protein and calories and avoid foods that can irritate your digestive tracts or lead to cramping.  ? ? Ask your nurse or doctor about medicine that can lessen or stop your diarrhea.  ? ? Keeping your pain under control is important to your well-being. Please tell your doctor or nurse if you are experiencing pain.  ? ? Infusion reactions may occur after your infusion. If this happens, call 911 for emergency care. Food and Drug Interactions  ? ? There are no known interactions of carfilzomib with food.  ? ? This drug may interact with other medicines. Tell your  doctor and pharmacist about all the prescription and over-the-counter medicines and dietary supplements (vitamins, minerals, herbs, and others) that you are taking at this time. Also, check with your doctor or pharmacist before starting any new prescription or over-the-counter medicines, or dietary supplements to make sure that there are no interactions.  ? ?When to Call the Doctor  ? ?Call your doctor or nurse if you have any of these symptoms and/or any new or unusual symptoms:  ? ? Fever of 100.4? F (38? C) or higher  ? ? Chills  ? ? Tiredness that interferes with your daily activities  ? ? A headache that does not go away  ? ? Blurry vision or other changes in eyesight  ? ? Confusion and/or agitation  ? ? Extreme tiredness and/or coma  ? ? Trouble understanding or speaking  ? ? Trouble thinking and/or memory loss  ? ? Numbness or lack of strength to your arms, legs, face, or body  ? ? Difficulty walking  ? ? Feeling dizzy or lightheaded  ? ? Easy bleeding or bruising  ? ? Dry cough and/or a cough that is bothersome  ? ? Wheezing and/or trouble breathing  ? ? Lips or skin turn a bluish color  ? ? Symptoms of a seizure such as confusion, blacking out, passing out,  loss of hearing or vision, blurred vision, unusual smells or tastes (such as burning rubber), trouble talking, tremors or shaking in parts or all of the body, repeated body movements, tense muscles that do no

## 2022-04-11 ENCOUNTER — Inpatient Hospital Stay (HOSPITAL_COMMUNITY): Payer: Medicare HMO

## 2022-04-11 ENCOUNTER — Other Ambulatory Visit (HOSPITAL_COMMUNITY): Payer: Self-pay | Admitting: Hematology

## 2022-04-11 VITALS — BP 121/61 | HR 65 | Temp 98.5°F | Resp 16

## 2022-04-11 DIAGNOSIS — N189 Chronic kidney disease, unspecified: Secondary | ICD-10-CM | POA: Diagnosis not present

## 2022-04-11 DIAGNOSIS — C61 Malignant neoplasm of prostate: Secondary | ICD-10-CM | POA: Diagnosis not present

## 2022-04-11 DIAGNOSIS — Z8042 Family history of malignant neoplasm of prostate: Secondary | ICD-10-CM | POA: Diagnosis not present

## 2022-04-11 DIAGNOSIS — Z8616 Personal history of COVID-19: Secondary | ICD-10-CM | POA: Diagnosis not present

## 2022-04-11 DIAGNOSIS — D631 Anemia in chronic kidney disease: Secondary | ICD-10-CM | POA: Diagnosis not present

## 2022-04-11 DIAGNOSIS — C9000 Multiple myeloma not having achieved remission: Secondary | ICD-10-CM

## 2022-04-11 DIAGNOSIS — Z808 Family history of malignant neoplasm of other organs or systems: Secondary | ICD-10-CM | POA: Diagnosis not present

## 2022-04-11 DIAGNOSIS — C7951 Secondary malignant neoplasm of bone: Secondary | ICD-10-CM | POA: Diagnosis not present

## 2022-04-11 DIAGNOSIS — Z5112 Encounter for antineoplastic immunotherapy: Secondary | ICD-10-CM | POA: Diagnosis not present

## 2022-04-11 LAB — BETA 2 MICROGLOBULIN, SERUM: Beta-2 Microglobulin: 3.9 mg/L — ABNORMAL HIGH (ref 0.6–2.4)

## 2022-04-11 MED ORDER — SODIUM CHLORIDE 0.9 % IV SOLN: INTRAVENOUS | Status: DC

## 2022-04-11 NOTE — Progress Notes (Signed)
Normal saline one liter given over 2 hours today per MD order. Patient tolerated it well without problems. Vitals stable and discharged home from clinic ambulatory. Follow up as scheduled. ? ?

## 2022-04-11 NOTE — Patient Instructions (Signed)
[facility]  Discharge Instructions: ?Thank you for choosing [facility] to provide your oncology and hematology care.  ?If you have a lab appointment with the Cancer Center, please come in thru the Main Entrance and check in at the main information desk. ? ? ? ?We strive to give you quality time with your provider. You may need to reschedule your appointment if you arrive late (15 or more minutes).  Arriving late affects you and other patients whose appointments are after yours.  Also, if you miss three or more appointments without notifying the office, you may be dismissed from the clinic at the provider?s discretion.    ?  ?For prescription refill requests, have your pharmacy contact our office and allow 72 hours for refills to be completed.   ? ?  ?To help prevent nausea and vomiting after your treatment, we encourage you to take your nausea medication as directed. ? ?BELOW ARE SYMPTOMS THAT SHOULD BE REPORTED IMMEDIATELY: ?*FEVER GREATER THAN 100.4 F (38 ?C) OR HIGHER ?*CHILLS OR SWEATING ?*NAUSEA AND VOMITING THAT IS NOT CONTROLLED WITH YOUR NAUSEA MEDICATION ?*UNUSUAL SHORTNESS OF BREATH ?*UNUSUAL BRUISING OR BLEEDING ?*URINARY PROBLEMS (pain or burning when urinating, or frequent urination) ?*BOWEL PROBLEMS (unusual diarrhea, constipation, pain near the anus) ?TENDERNESS IN MOUTH AND THROAT WITH OR WITHOUT PRESENCE OF ULCERS (sore throat, sores in mouth, or a toothache) ?UNUSUAL RASH, SWELLING OR PAIN  ?UNUSUAL VAGINAL DISCHARGE OR ITCHING  ? ?Items with * indicate a potential emergency and should be followed up as soon as possible or go to the Emergency Department if any problems should occur. ? ?Please show the CHEMOTHERAPY ALERT CARD or IMMUNOTHERAPY ALERT CARD at check-in to the Emergency Department and triage nurse. ? ?Should you have questions after your visit or need to cancel or reschedule your appointment, please contact [facility] [phone]  and follow  the prompts.  Office hours are 8:00 a.m. to 4:30 p.m. Monday - Friday. Please note that voicemails left after 4:00 p.m. may not be returned until the following business day.  We are closed weekends and major holidays. You have access to a nurse at all times for urgent questions. Please call the main number to the clinic [phone] and follow the prompts. ? ?For any non-urgent questions, you may also contact your provider using MyChart. We now offer e-Visits for anyone 18 and older to request care online for non-urgent symptoms. For details visit [email or web address]. ?  ?Also download the MyChart app! Go to the app store, search "MyChart", open the app, select [facility], and log in with your MyChart username and password. ? ?Due to Covid, a mask is required upon entering the hospital/clinic. If you do not have a mask, one will be given to you upon arrival. For doctor visits, patients may have 1 support person aged 18 or older with them. For treatment visits, patients cannot have anyone with them due to current Covid guidelines and our immunocompromised population.  ?

## 2022-04-14 ENCOUNTER — Other Ambulatory Visit (HOSPITAL_COMMUNITY): Payer: Self-pay | Admitting: *Deleted

## 2022-04-14 ENCOUNTER — Other Ambulatory Visit (HOSPITAL_COMMUNITY): Payer: Self-pay

## 2022-04-14 ENCOUNTER — Ambulatory Visit (HOSPITAL_COMMUNITY): Payer: Medicare HMO

## 2022-04-14 ENCOUNTER — Inpatient Hospital Stay (HOSPITAL_COMMUNITY): Payer: Medicare HMO

## 2022-04-14 ENCOUNTER — Other Ambulatory Visit: Payer: Self-pay

## 2022-04-14 ENCOUNTER — Encounter: Payer: Self-pay | Admitting: General Surgery

## 2022-04-14 ENCOUNTER — Ambulatory Visit: Payer: Medicare HMO | Admitting: General Surgery

## 2022-04-14 VITALS — BP 145/75 | HR 68 | Temp 98.6°F | Resp 12 | Ht 74.0 in | Wt 174.0 lb

## 2022-04-14 DIAGNOSIS — C9000 Multiple myeloma not having achieved remission: Secondary | ICD-10-CM

## 2022-04-14 NOTE — Progress Notes (Signed)
Standing orders placed per Dr. Katragadda 

## 2022-04-14 NOTE — Progress Notes (Signed)
Luis Stewart; [id number]; April 06, 1951 ? ? ?HPI ?Patient is a 71 year old black male who was referred to my care by Dr. Delton Coombes of oncology for Port-A-Cath placement.  He has multiple myeloma and needs to undergo chemotherapy.  He needs central venous access.  He also has a history of prostate carcinoma. ?Past Medical History:  ?Diagnosis Date  ? Arthritis   ? Asbestosis ([facility])   ? Closed fracture of distal end of fibula with tibia with routine healing 05/28/2020  ? COVID-19 09/03/2021  ? Cyst of kidney, acquired   ? Headache   ? History of fracture of leg   ? Right, childhood  ? Hx of migraine headaches   ? Hyperlipidemia   ? Neuropathy   ? PE (pulmonary thromboembolism) ([place])   ? After knee surgery  ? Prostate cancer ([facility]) 2015  ? Adenocarcinoma by biopsy  ? ? ?Past Surgical History:  ?Procedure Laterality Date  ? BIOPSY PROSTATE  2003 and 2005  ? COLONOSCOPY  11/28/2008  ? Dr. Rourk:internal hemorrhoids/tortus colon/ascending colon polyps, tubular adenomas  ? COLONOSCOPY N/A 09/04/2014  ? Procedure: COLONOSCOPY;  Surgeon: Daneil Dolin, MD;  [facility];  Service: Endoscopy;  Laterality: N/A;  9:30  ? COLONOSCOPY N/A 07/17/2017  ? Procedure: COLONOSCOPY;  Surgeon: Danie Binder, MD;  [facility];  Service: Endoscopy;  Laterality: N/A;  ? ESOPHAGOGASTRODUODENOSCOPY N/A 07/16/2017  ? Procedure: ESOPHAGOGASTRODUODENOSCOPY (EGD);  Surgeon: Danie Binder, MD;  [facility];  Service: Endoscopy;  Laterality: N/A;  ? GIVENS CAPSULE STUDY  07/16/2017  ? Procedure: GIVENS CAPSULE STUDY;  Surgeon: Danie Binder, MD;  [facility];  Service: Endoscopy;;  ? JOINT REPLACEMENT N/A   ? [employer] 01/12/2021  ? POLYPECTOMY  07/17/2017  ? Procedure: POLYPECTOMY;  Surgeon: Danie Binder, MD;  [facility];  Service: Endoscopy;;  cecal  ? TOTAL KNEE ARTHROPLASTY Right 06/10/2017  ? TOTAL KNEE ARTHROPLASTY Right 06/10/2017  ? Procedure: TOTAL KNEE ARTHROPLASTY;   Surgeon: [name], MD;  Location: [facility];  Service: Orthopedics;  Laterality: Right;  ? ? ?Family History  ?Problem Relation Age of Onset  ? Diabetes Mother   ? Hypertension Mother   ? Obesity Mother   ? Heart disease Mother   ? Mental illness Mother   ? Hypertension Sister   ? Obesity Sister   ? Arthritis Sister   ? Deep vein thrombosis Father   ? Dementia Father   ? Heart disease Father   ?     CABG  ? Colon cancer Neg Hx   ? ? ?Current Outpatient Medications on File Prior to Visit  ?Medication Sig Dispense Refill  ? acetaminophen (TYLENOL) 500 MG tablet Take 1,000 mg by mouth every 6 (six) hours as needed for mild pain or moderate pain.    ? benzonatate (TESSALON) 100 MG capsule Take 1 capsule (100 mg total) by mouth 2 (two) times daily as needed for cough. 20 capsule 0  ? Cholecalciferol (VITAMIN D) 2000 units CAPS Take 2,000 Units by mouth 3 (three) times a week.     ? methocarbamol (ROBAXIN) 500 MG tablet Take 1 tablet (500 mg total) by mouth 3 (three) times daily. 60 tablet 1  ? Multiple Vitamin (MULTIVITAMIN WITH MINERALS) TABS tablet Take 1 tablet by mouth daily. Centrum Silver for Men    ? nitroGLYCERIN (NITROSTAT) 0.4 MG SL tablet Place 1 tablet (0.4 mg total) under the tongue every 5 (five) minutes as  needed for chest pain. 30 tablet 0  ? pantoprazole (PROTONIX) 40 MG tablet Take 1 tablet (40 mg total) by mouth every other day. 90 tablet 0  ? ?No current facility-administered medications on file prior to visit.  ? ? ?Allergies  ?Allergen Reactions  ? Sulfa Antibiotics Other (See Comments)  ?  Unknown reaction. Childhood reaction  ? ? ?Social History  ? ?Substance and Sexual Activity  ?Alcohol Use No  ? ? ?Social History  ? ?Tobacco Use  ?Smoking Status Never  ?Smokeless Tobacco Never  ? ? ?Review of Systems  ?Constitutional: Negative.   ?HENT:  Positive for sinus pain.   ?Eyes: Negative.   ?Respiratory: Negative.    ?Cardiovascular:  Positive for chest pain.  ?Gastrointestinal:  Positive for  heartburn.  ?Genitourinary:  Positive for frequency.  ?Musculoskeletal:  Positive for back pain and joint pain.  ?Skin:  Positive for rash.  ?Neurological:  Positive for sensory change.  ?Endo/Heme/Allergies: Negative.   ?Psychiatric/Behavioral: Negative.    ? ?Objective  ? ?Vitals:  ? 04/14/22 1302  ?BP: (!) 145/75  ?Pulse: 68  ?Resp: 12  ?Temp: 98.6 ?F (37 ?C)  ?SpO2: 98%  ? ? ?Physical Exam ?Vitals reviewed.  ?Constitutional:   ?   Appearance: Normal appearance. He is normal weight. He is not ill-appearing.  ?HENT:  ?   Head: Normocephalic and atraumatic.  ?Cardiovascular:  ?   Rate and Rhythm: Normal rate and regular rhythm.  ?   Heart sounds: Normal heart sounds. No murmur heard. ?  No friction rub. No gallop.  ?Pulmonary:  ?   Effort: Pulmonary effort is normal. No respiratory distress.  ?   Breath sounds: Normal breath sounds. No stridor. No wheezing, rhonchi or rales.  ?Skin: ?   General: Skin is warm and dry.  ?Neurological:  ?   Mental Status: He is alert and oriented to person, place, and time.  ? ?Dr. Tomie China notes reviewed ?Assessment  ?Multiple myeloma, need for central venous access ?Plan  ?Patient is scheduled for a Port-A-Cath placement on 04/18/2022.  The risks and benefits of the procedure including bleeding, infection, and pneumothorax were fully explained to the patient, who gave informed consent. ?

## 2022-04-14 NOTE — Progress Notes (Signed)
Pharmacist Chemotherapy Monitoring - Initial Assessment   ? ?Anticipated start date: 04/15/22  ? ?The following has been reviewed per standard work regarding the patient's treatment regimen: ?The patient's diagnosis, treatment plan and drug doses, and organ/hematologic function ?Lab orders and baseline tests specific to treatment regimen  ?The treatment plan start date, drug sequencing, and pre-medications ?Prior authorization status  ?Patient's documented medication list, including drug-drug interaction screen and prescriptions for anti-emetics and supportive care specific to the treatment regimen ?The drug concentrations, fluid compatibility, administration routes, and timing of the medications to be used ?The patient's access for treatment and lifetime cumulative dose history, if applicable  ?The patient's medication allergies and previous infusion related reactions, if applicable  ? ?Changes made to treatment plan:  ?N/A ? ?Follow up needed:  ?N/A ? ? ?Wynona Neat, [facility], ?04/14/2022  3:18 PM ? ?

## 2022-04-14 NOTE — H&P (Signed)
Luis Stewart; [id number]; 11/06/1951 ? ? ?HPI ?Patient is a 71-year-old black male who was referred to my care by Dr. Katragadda of oncology for Port-A-Cath placement.  He has multiple myeloma and needs to undergo chemotherapy.  He needs central venous access.  He also has a history of prostate carcinoma. ?Past Medical History:  ?Diagnosis Date  ? Arthritis   ? Asbestosis ([facility])   ? Closed fracture of distal end of fibula with tibia with routine healing 05/28/2020  ? COVID-19 09/03/2021  ? Cyst of kidney, acquired   ? Headache   ? History of fracture of leg   ? Right, childhood  ? Hx of migraine headaches   ? Hyperlipidemia   ? Neuropathy   ? PE (pulmonary thromboembolism) ([facility])   ? After knee surgery  ? Prostate cancer ([facility]) 2015  ? Adenocarcinoma by biopsy  ? ? ?Past Surgical History:  ?Procedure Laterality Date  ? BIOPSY PROSTATE  2003 and 2005  ? COLONOSCOPY  11/28/2008  ? Dr. Rourk:internal hemorrhoids/tortus colon/ascending colon polyps, tubular adenomas  ? COLONOSCOPY N/A 09/04/2014  ? Procedure: COLONOSCOPY;  Surgeon: Robert M Rourk, MD;  [facility];  Service: Endoscopy;  Laterality: N/A;  9:30  ? COLONOSCOPY N/A 07/17/2017  ? Procedure: COLONOSCOPY;  Surgeon: Fields, Sandi L, MD;  [facility];  Service: Endoscopy;  Laterality: N/A;  ? ESOPHAGOGASTRODUODENOSCOPY N/A 07/16/2017  ? Procedure: ESOPHAGOGASTRODUODENOSCOPY (EGD);  Surgeon: Fields, Sandi L, MD;  [facility];  Service: Endoscopy;  Laterality: N/A;  ? GIVENS CAPSULE STUDY  07/16/2017  ? Procedure: GIVENS CAPSULE STUDY;  Surgeon: Fields, Sandi L, MD;  [facility];  Service: Endoscopy;;  ? JOINT REPLACEMENT N/A   ? [employer] 01/12/2021  ? POLYPECTOMY  07/17/2017  ? Procedure: POLYPECTOMY;  Surgeon: Fields, Sandi L, MD;  [facility];  Service: Endoscopy;;  cecal  ? TOTAL KNEE ARTHROPLASTY Right 06/10/2017  ? TOTAL KNEE ARTHROPLASTY Right 06/10/2017  ? Procedure: TOTAL KNEE ARTHROPLASTY;   Surgeon: Murphy, Daniel F, MD;  [facility];  Service: Orthopedics;  Laterality: Right;  ? ? ?Family History  ?Problem Relation Age of Onset  ? Diabetes Mother   ? Hypertension Mother   ? Obesity Mother   ? Heart disease Mother   ? Mental illness Mother   ? Hypertension Sister   ? Obesity Sister   ? Arthritis Sister   ? Deep vein thrombosis Father   ? Dementia Father   ? Heart disease Father   ?     CABG  ? Colon cancer Neg Hx   ? ? ?Current Outpatient Medications on File Prior to Visit  ?Medication Sig Dispense Refill  ? acetaminophen (TYLENOL) 500 MG tablet Take 1,000 mg by mouth every 6 (six) hours as needed for mild pain or moderate pain.    ? benzonatate (TESSALON) 100 MG capsule Take 1 capsule (100 mg total) by mouth 2 (two) times daily as needed for cough. 20 capsule 0  ? Cholecalciferol (VITAMIN D) 2000 units CAPS Take 2,000 Units by mouth 3 (three) times a week.     ? methocarbamol (ROBAXIN) 500 MG tablet Take 1 tablet (500 mg total) by mouth 3 (three) times daily. 60 tablet 1  ? Multiple Vitamin (MULTIVITAMIN WITH MINERALS) TABS tablet Take 1 tablet by mouth daily. Centrum Silver for Men    ? nitroGLYCERIN (NITROSTAT) 0.4 MG SL tablet Place 1 tablet (0.4 mg total) under the tongue every 5 (five) minutes as   needed for chest pain. 30 tablet 0  ? pantoprazole (PROTONIX) 40 MG tablet Take 1 tablet (40 mg total) by mouth every other day. 90 tablet 0  ? ?No current facility-administered medications on file prior to visit.  ? ? ?Allergies  ?Allergen Reactions  ? Sulfa Antibiotics Other (See Comments)  ?  Unknown reaction. Childhood reaction  ? ? ?Social History  ? ?Substance and Sexual Activity  ?Alcohol Use No  ? ? ?Social History  ? ?Tobacco Use  ?Smoking Status Never  ?Smokeless Tobacco Never  ? ? ?Review of Systems  ?Constitutional: Negative.   ?HENT:  Positive for sinus pain.   ?Eyes: Negative.   ?Respiratory: Negative.    ?Cardiovascular:  Positive for chest pain.  ?Gastrointestinal:  Positive for  heartburn.  ?Genitourinary:  Positive for frequency.  ?Musculoskeletal:  Positive for back pain and joint pain.  ?Skin:  Positive for rash.  ?Neurological:  Positive for sensory change.  ?Endo/Heme/Allergies: Negative.   ?Psychiatric/Behavioral: Negative.    ? ?Objective  ? ?Vitals:  ? 04/14/22 1302  ?BP: (!) 145/75  ?Pulse: 68  ?Resp: 12  ?Temp: 98.6 ?F (37 ?C)  ?SpO2: 98%  ? ? ?Physical Exam ?Vitals reviewed.  ?Constitutional:   ?   Appearance: Normal appearance. He is normal weight. He is not ill-appearing.  ?HENT:  ?   Head: Normocephalic and atraumatic.  ?Cardiovascular:  ?   Rate and Rhythm: Normal rate and regular rhythm.  ?   Heart sounds: Normal heart sounds. No murmur heard. ?  No friction rub. No gallop.  ?Pulmonary:  ?   Effort: Pulmonary effort is normal. No respiratory distress.  ?   Breath sounds: Normal breath sounds. No stridor. No wheezing, rhonchi or rales.  ?Skin: ?   General: Skin is warm and dry.  ?Neurological:  ?   Mental Status: He is alert and oriented to person, place, and time.  ? ?Dr. Katragadda's notes reviewed ?Assessment  ?Multiple myeloma, need for central venous access ?Plan  ?Patient is scheduled for a Port-A-Cath placement on 04/18/2022.  The risks and benefits of the procedure including bleeding, infection, and pneumothorax were fully explained to the patient, who gave informed consent. ?

## 2022-04-15 ENCOUNTER — Encounter (HOSPITAL_COMMUNITY): Payer: Self-pay

## 2022-04-15 ENCOUNTER — Inpatient Hospital Stay (HOSPITAL_COMMUNITY): Payer: Medicare HMO

## 2022-04-15 ENCOUNTER — Other Ambulatory Visit (HOSPITAL_COMMUNITY): Payer: Medicare HMO

## 2022-04-15 ENCOUNTER — Ambulatory Visit (HOSPITAL_COMMUNITY): Payer: Medicare HMO

## 2022-04-15 VITALS — BP 139/84 | HR 69 | Temp 97.3°F | Resp 18 | Ht 74.0 in | Wt 177.1 lb

## 2022-04-15 DIAGNOSIS — C9000 Multiple myeloma not having achieved remission: Secondary | ICD-10-CM

## 2022-04-15 DIAGNOSIS — C7951 Secondary malignant neoplasm of bone: Secondary | ICD-10-CM | POA: Diagnosis not present

## 2022-04-15 DIAGNOSIS — Z8616 Personal history of COVID-19: Secondary | ICD-10-CM | POA: Diagnosis not present

## 2022-04-15 DIAGNOSIS — Z5112 Encounter for antineoplastic immunotherapy: Secondary | ICD-10-CM | POA: Diagnosis not present

## 2022-04-15 DIAGNOSIS — Z808 Family history of malignant neoplasm of other organs or systems: Secondary | ICD-10-CM | POA: Diagnosis not present

## 2022-04-15 DIAGNOSIS — N189 Chronic kidney disease, unspecified: Secondary | ICD-10-CM | POA: Diagnosis not present

## 2022-04-15 DIAGNOSIS — C61 Malignant neoplasm of prostate: Secondary | ICD-10-CM

## 2022-04-15 DIAGNOSIS — D631 Anemia in chronic kidney disease: Secondary | ICD-10-CM | POA: Diagnosis not present

## 2022-04-15 DIAGNOSIS — Z8042 Family history of malignant neoplasm of prostate: Secondary | ICD-10-CM | POA: Diagnosis not present

## 2022-04-15 LAB — CBC WITH DIFFERENTIAL/PLATELET
Abs Immature Granulocytes: 0.04 10*3/uL (ref 0.00–0.07)
Basophils Absolute: 0 10*3/uL (ref 0.0–0.1)
Basophils Relative: 1 %
Eosinophils Absolute: 0.2 10*3/uL (ref 0.0–0.5)
Eosinophils Relative: 4 %
HCT: 29.8 % — ABNORMAL LOW (ref 39.0–52.0)
Hemoglobin: 9.9 g/dL — ABNORMAL LOW (ref 13.0–17.0)
Immature Granulocytes: 1 %
Lymphocytes Relative: 38 %
Lymphs Abs: 2.1 10*3/uL (ref 0.7–4.0)
MCH: 30.6 pg (ref 26.0–34.0)
MCHC: 33.2 g/dL (ref 30.0–36.0)
MCV: 92 fL (ref 80.0–100.0)
Monocytes Absolute: 0.4 10*3/uL (ref 0.1–1.0)
Monocytes Relative: 8 %
Neutro Abs: 2.6 10*3/uL (ref 1.7–7.7)
Neutrophils Relative %: 48 %
Platelets: 134 10*3/uL — ABNORMAL LOW (ref 150–400)
RBC: 3.24 MIL/uL — ABNORMAL LOW (ref 4.22–5.81)
RDW: 17.8 % — ABNORMAL HIGH (ref 11.5–15.5)
WBC: 5.4 10*3/uL (ref 4.0–10.5)
nRBC: 0 % (ref 0.0–0.2)

## 2022-04-15 LAB — TYPE AND SCREEN
ABO/RH(D): A POS
Antibody Screen: NEGATIVE

## 2022-04-15 LAB — COMPREHENSIVE METABOLIC PANEL
ALT: 24 U/L (ref 0–44)
AST: 25 U/L (ref 15–41)
Albumin: 3 g/dL — ABNORMAL LOW (ref 3.5–5.0)
Alkaline Phosphatase: 56 U/L (ref 38–126)
Anion gap: 1 — ABNORMAL LOW (ref 5–15)
BUN: 19 mg/dL (ref 8–23)
CO2: 25 mmol/L (ref 22–32)
Calcium: 8.7 mg/dL — ABNORMAL LOW (ref 8.9–10.3)
Chloride: 108 mmol/L (ref 98–111)
Creatinine, Ser: 1.76 mg/dL — ABNORMAL HIGH (ref 0.61–1.24)
GFR, Estimated: 41 mL/min — ABNORMAL LOW (ref >60)
Glucose, Bld: 91 mg/dL (ref 70–99)
Potassium: 3.6 mmol/L (ref 3.5–5.1)
Sodium: 134 mmol/L — ABNORMAL LOW (ref 135–145)
Total Bilirubin: 0.7 mg/dL (ref 0.3–1.2)
Total Protein: 11.2 g/dL — ABNORMAL HIGH (ref 6.5–8.1)

## 2022-04-15 LAB — MAGNESIUM: Magnesium: 1.9 mg/dL (ref 1.7–2.4)

## 2022-04-15 MED ORDER — SODIUM CHLORIDE 0.9 % IV SOLN: Freq: Once | INTRAVENOUS | Status: AC

## 2022-04-15 MED ORDER — SODIUM CHLORIDE 0.9 % IV SOLN
40.0000 mg | Freq: Once | INTRAVENOUS | Status: AC
  Administered 2022-04-15: 40 mg via INTRAVENOUS
  Filled 2022-04-15: qty 4

## 2022-04-15 MED ORDER — LIDOCAINE-PRILOCAINE 2.5-2.5 % EX CREA: TOPICAL_CREAM | CUTANEOUS | 0 refills | Status: DC

## 2022-04-15 MED ORDER — ACETAMINOPHEN 325 MG PO TABS
650.0000 mg | ORAL_TABLET | Freq: Once | ORAL | Status: AC
  Administered 2022-04-15: 650 mg via ORAL
  Filled 2022-04-15: qty 2

## 2022-04-15 MED ORDER — MONTELUKAST SODIUM 10 MG PO TABS
10.0000 mg | ORAL_TABLET | Freq: Once | ORAL | Status: AC
  Administered 2022-04-15: 10 mg via ORAL
  Filled 2022-04-15: qty 1

## 2022-04-15 MED ORDER — DEXTROSE 5 % IV SOLN
20.0000 mg/m2 | Freq: Once | INTRAVENOUS | Status: AC
  Administered 2022-04-15: 40 mg via INTRAVENOUS
  Filled 2022-04-15: qty 15

## 2022-04-15 MED ORDER — ACYCLOVIR 400 MG PO TABS: 400.0000 mg | ORAL_TABLET | Freq: Two times a day (BID) | ORAL | 6 refills | Status: DC

## 2022-04-15 MED ORDER — DEGARELIX ACETATE(240 MG DOSE) 120 MG/VIAL ~~LOC~~ SOLR
240.0000 mg | Freq: Once | SUBCUTANEOUS | Status: AC
  Administered 2022-04-15: 240 mg via SUBCUTANEOUS
  Filled 2022-04-15: qty 6

## 2022-04-15 MED ORDER — DARATUMUMAB-HYALURONIDASE-FIHJ 1800-30000 MG-UT/15ML ~~LOC~~ SOLN
1800.0000 mg | Freq: Once | SUBCUTANEOUS | Status: AC
  Administered 2022-04-15: 1800 mg via SUBCUTANEOUS
  Filled 2022-04-15: qty 15

## 2022-04-15 MED ORDER — PROCHLORPERAZINE MALEATE 10 MG PO TABS: 10.0000 mg | ORAL_TABLET | Freq: Four times a day (QID) | ORAL | 3 refills | Status: DC | PRN

## 2022-04-15 MED ORDER — DIPHENHYDRAMINE HCL 25 MG PO CAPS
50.0000 mg | ORAL_CAPSULE | Freq: Once | ORAL | Status: AC
  Administered 2022-04-15: 50 mg via ORAL
  Filled 2022-04-15: qty 2

## 2022-04-15 NOTE — Patient Instructions (Signed)
[place]  Discharge Instructions: ?Thank you for choosing [facility] to provide your oncology and hematology care.  ?If you have a lab appointment with the [facility], please come in thru the Main Entrance and check in at the main information desk. ? ?Wear comfortable clothing and clothing appropriate for easy access to any Portacath or PICC line.  ? ?We strive to give you quality time with your provider. You may need to reschedule your appointment if you arrive late (15 or more minutes).  Arriving late affects you and other patients whose appointments are after yours.  Also, if you miss three or more appointments without notifying the office, you may be dismissed from the clinic at the provider?s discretion.    ?  ?For prescription refill requests, have your pharmacy contact our office and allow 72 hours for refills to be completed.   ? ?Today you received the following chemotherapy and/or immunotherapy agents kyprolis, darzalex, and firmagon.    ?  ?To help prevent nausea and vomiting after your treatment, we encourage you to take your nausea medication as directed. ? ?BELOW ARE SYMPTOMS THAT SHOULD BE REPORTED IMMEDIATELY: ?*FEVER GREATER THAN 100.4 F (38 ?C) OR HIGHER ?*CHILLS OR SWEATING ?*NAUSEA AND VOMITING THAT IS NOT CONTROLLED WITH YOUR NAUSEA MEDICATION ?*UNUSUAL SHORTNESS OF BREATH ?*UNUSUAL BRUISING OR BLEEDING ?*URINARY PROBLEMS (pain or burning when urinating, or frequent urination) ?*BOWEL PROBLEMS (unusual diarrhea, constipation, pain near the anus) ?TENDERNESS IN MOUTH AND THROAT WITH OR WITHOUT PRESENCE OF ULCERS (sore throat, sores in mouth, or a toothache) ?UNUSUAL RASH, SWELLING OR PAIN  ?UNUSUAL VAGINAL DISCHARGE OR ITCHING  ? ?Items with * indicate a potential emergency and should be followed up as soon as possible or go to the Emergency Department if any problems should occur. ? ?Please show the CHEMOTHERAPY ALERT CARD or IMMUNOTHERAPY ALERT CARD at check-in  to the Emergency Department and triage nurse. ? ?Should you have questions after your visit or need to cancel or reschedule your appointment, please contact [facility] [phone]  and follow the prompts.  Office hours are 8:00 a.m. to 4:30 p.m. Monday - Friday. Please note that voicemails left after 4:00 p.m. may not be returned until the following business day.  We are closed weekends and major holidays. You have access to a nurse at all times for urgent questions. Please call the main number to the clinic [phone] and follow the prompts. ? ?For any non-urgent questions, you may also contact your provider using MyChart. We now offer e-Visits for anyone 62 and older to request care online for non-urgent symptoms. For details visit [email or web address]. ?  ?Also download the MyChart app! Go to the app store, search "MyChart", open the app, select [facility], and log in with your MyChart username and password. ? ?Due to Covid, a mask is required upon entering the hospital/clinic. If you do not have a mask, one will be given to you upon arrival. For doctor visits, patients may have 1 support person aged 43 or older with them. For treatment visits, patients cannot have anyone with them due to current Covid guidelines and our immunocompromised population.  ?

## 2022-04-15 NOTE — Progress Notes (Signed)

## 2022-04-15 NOTE — Patient Instructions (Signed)
? ? ? ? ? ? ? ? [name] ? 04/15/2022  ?  ? '@PREFPERIOPPHARMACY'$ @ ? ? Your procedure is scheduled on  04/18/2022. ? ? Report to [facility] at  [phone]  A.M. ? ? Call this number if you have problems the morning of surgery: ? [phone] ? ? Remember: ? Do not eat or drink after midnight. ?  ?  ? Take these medicines the morning of surgery with A SIP OF WATER  ? ?     robaxin (if needed), protonix, compazine(if needed). ?  ? Do not wear jewelry, make-up or nail polish. ? Do not wear lotions, powders, or perfumes, or deodorant. ? Do not shave 48 hours prior to surgery.  Men may shave face and neck. ? Do not bring valuables to the hospital. ? [facility] is not responsible for any belongings or valuables. ? ?Contacts, dentures or bridgework may not be worn into surgery.  Leave your suitcase in the car.  After surgery it may be brought to your room. ? ?For patients admitted to the hospital, discharge time will be determined by your treatment team. ? ?Patients discharged the day of surgery will not be allowed to drive home and must have someone with them for 24 hours.  ? ? ?Special instructions:   DO NOT smoke tobacco or vape for 24 hours before your procedure. ? ?Please read over the following fact sheets that you were given. ?Coughing and Deep Breathing, Surgical Site Infection Prevention, Anesthesia Post-op Instructions, and Care and Recovery After Surgery ?  ? ? ? Implanted [facility] Guide ?An implanted port is a device that is placed under the skin. It is usually placed in the chest. The device may vary based on the need. Implanted ports can be used to give IV medicine, to take blood, or to give fluids. You may have an implanted port if: ?You need IV medicine that would be irritating to the small veins in your hands or arms. ?You need IV medicines, such as chemotherapy, for a long period of time. ?You need IV nutrition for a long period of time. ?You may have fewer limitations when using a port than  you would if you used other types of long-term IVs. You will also likely be able to return to normal activities after your incision heals. ?An implanted port has two main parts: ?Reservoir. The reservoir is the part where a needle is inserted to give medicines or draw blood. The reservoir is round. After the port is placed, it appears as a small, raised area under your skin. ?Catheter. The catheter is a small, thin tube that connects the reservoir to a vein. Medicine that is inserted into the reservoir goes into the catheter and then into the vein. ?How is my port accessed? ?To access your port: ?A numbing cream may be placed on the skin over the port site. ?Your health care provider will put on a mask and sterile gloves. ?The skin over your port will be cleaned carefully with a germ-killing soap and allowed to dry. ?Your health care provider will gently pinch the port and insert a needle into it. ?Your health care provider will check for a blood return to make sure the port is in the vein and is still working (patent). ?If your port needs to remain accessed to get medicine continuously (constant infusion), your health care provider will place a clear bandage (dressing) over the needle site. The dressing and needle will need to be changed  every week, or as told by your health care provider. ?What is flushing? ?Flushing helps keep the port working. Follow instructions from your health care provider about how and when to flush the port. Ports are usually flushed with saline solution or a medicine called heparin. The need for flushing will depend on how the port is used: ?If the port is only used from time to time to give medicines or draw blood, the port may need to be flushed: ?Before and after medicines have been given. ?Before and after blood has been drawn. ?As part of routine maintenance. Flushing may be recommended every 4-6 weeks. ?If a constant infusion is running, the port may not need to be flushed. ?Throw  away any syringes in a disposal container that is meant for sharp items (sharps container). You can buy a sharps container from a pharmacy, or you can make one by using an empty hard plastic bottle with a cover. ?How long will my port stay implanted? ?The port can stay in for as long as your health care provider thinks it is needed. When it is time for the port to come out, a surgery will be done to remove it. The surgery will be similar to the procedure that was done to put the port in. ?Follow these instructions at home: ?Caring for your port and port site ?Flush your port as told by your health care provider. ?If you need an infusion over several days, follow instructions from your health care provider about how to take care of your port site. Make sure you: ?Change your dressing as told by your health care provider. ?Wash your hands with soap and water for at least 20 seconds before and after you change your dressing. If soap and water are not available, use alcohol-based hand sanitizer. ?Place any used dressings or infusion bags into a plastic bag. Throw that bag in the trash. ?Keep the dressing that covers the needle clean and dry. Do not get it wet. ?Do not use scissors or sharp objects near the infusion tubing. ?Keep any external tubes clamped, unless they are being used. ?Check your port site every day for signs of infection. Check for: ?Redness, swelling, or pain. ?Fluid or blood. ?Warmth. ?Pus or a bad smell. ?Protect the skin around the port site. ?Avoid wearing bra straps that rub or irritate the site. ?Protect the skin around your port from seat belts. Place a soft pad over your chest if needed. ?Bathe or shower as told by your health care provider. The site may get wet as long as you are not actively receiving an infusion. ?General instructions ? ?Return to your normal activities as told by your health care provider. Ask your health care provider what activities are safe for you. ?Carry a medical  alert card or wear a medical alert bracelet at all times. This will let health care providers know that you have an implanted port in case of an emergency. ?Where to find more information ?American Cancer Society: www.cancer.org ?American Society of Clinical Oncology: www.cancer.net ?Contact a health care provider if: ?You have a fever or chills. ?You have redness, swelling, or pain at the port site. ?You have fluid or blood coming from your port site. ?Your incision feels warm to the touch. ?You have pus or a bad smell coming from the port site. ?Summary ?Implanted ports are usually placed in the chest for long-term IV access. ?Follow instructions from your health care provider about flushing the port and changing bandages (dressings). ?  Take care of the area around your port by avoiding clothing that puts pressure on the area, and by watching for signs of infection. ?Protect the skin around your port from seat belts. Place a soft pad over your chest if needed. ?Contact a health care provider if you have a fever or you have redness, swelling, pain, fluid, or a bad smell at the port site. ?This information is not intended to replace advice given to you by your health care provider. Make sure you discuss any questions you have with your health care provider. ?Document Revised: 06/11/2021 Document Reviewed: 06/11/2021 ?Elsevier Patient Education ? [place] Insertion, Care After ?The following information offers guidance on how to care for yourself after your procedure. Your health care provider may also give you more specific instructions. If you have problems or questions, contact your health care provider. ?What can I expect after the procedure? ?After the procedure, it is common to have: ?Discomfort at the port insertion site. ?Bruising on the skin over the port. This should improve over 3-4 days. ?Follow these instructions at home: ?Port care ?After your port is placed, you will get a  manufacturer's information card. The card has information about your port. Keep this card with you at all times. ?Take care of the port as told by your health care provider. Ask your health care provider if you

## 2022-04-15 NOTE — Progress Notes (Signed)
Serum Creatinine 1.76 today.  Dr. Delton Coombes notified with ok to treat.   ? ?Patient tolerated chemotherapy with no complaints voiced.  Side effects with management reviewed with understanding verbalized.  Peripheral IV site clean and dry with no bruising or swelling noted at site.  Good blood return noted before and after administration of chemotherapy.   ? ?Patient tolerated Daratumumab and Firmagon injections with no complaints voiced.  See MAR for details.  Labs reviewed. Injection sites clean and dry with no bruising or swelling noted at sites.  Band aids applied.  Vss with discharge and left in satisfactory condition with no s/s of distress noted.   ?

## 2022-04-16 ENCOUNTER — Encounter (HOSPITAL_COMMUNITY)
Admission: RE | Admit: 2022-04-16 | Discharge: 2022-04-16 | Disposition: A | Discharge status: Home / Self Care | Payer: Medicare HMO | Source: Ambulatory Visit | Attending: General Surgery | Admitting: General Surgery

## 2022-04-16 ENCOUNTER — Other Ambulatory Visit: Payer: Self-pay

## 2022-04-16 ENCOUNTER — Encounter (HOSPITAL_COMMUNITY): Payer: Self-pay

## 2022-04-16 ENCOUNTER — Telehealth (HOSPITAL_COMMUNITY): Payer: Self-pay

## 2022-04-16 NOTE — Telephone Encounter (Signed)
Chemotherapy 24 hour follow up call.  Stated some tenderness and swelling from Luis Stewart injections.  Patient also stated he had a regular bowel movement this morning but later a softer/looser stool.  He started the Imodium as directed in the chemo teaching packet.  Reviewed the diarrhea teaching sheet and when to call the CC with understanding verbalized. No other complaints voiced.   ?

## 2022-04-16 NOTE — Pre-Procedure Instructions (Signed)
Attempted pre-op phone call. Left voicemail for him to call us back. 

## 2022-04-18 ENCOUNTER — Ambulatory Visit (HOSPITAL_COMMUNITY): Payer: Medicare HMO

## 2022-04-18 ENCOUNTER — Encounter (HOSPITAL_COMMUNITY): Payer: Self-pay

## 2022-04-18 ENCOUNTER — Encounter (HOSPITAL_COMMUNITY): Payer: Self-pay | Admitting: General Surgery

## 2022-04-18 ENCOUNTER — Ambulatory Visit (HOSPITAL_BASED_OUTPATIENT_CLINIC_OR_DEPARTMENT_OTHER): Payer: Medicare HMO | Admitting: Certified Registered Nurse Anesthetist

## 2022-04-18 ENCOUNTER — Ambulatory Visit (HOSPITAL_COMMUNITY): Payer: Medicare HMO | Admitting: Certified Registered Nurse Anesthetist

## 2022-04-18 ENCOUNTER — Encounter (HOSPITAL_COMMUNITY)
Admission: RE | Disposition: A | Discharge status: Home / Self Care | Payer: Self-pay | Source: Home / Self Care | Attending: General Surgery

## 2022-04-18 ENCOUNTER — Other Ambulatory Visit: Payer: Self-pay

## 2022-04-18 ENCOUNTER — Ambulatory Visit (HOSPITAL_COMMUNITY)
Admission: RE | Admit: 2022-04-18 | Discharge: 2022-04-18 | Disposition: A | Discharge status: Home / Self Care | Payer: Medicare HMO | Attending: General Surgery | Admitting: General Surgery

## 2022-04-18 DIAGNOSIS — M1990 Unspecified osteoarthritis, unspecified site: Secondary | ICD-10-CM | POA: Diagnosis not present

## 2022-04-18 DIAGNOSIS — D631 Anemia in chronic kidney disease: Secondary | ICD-10-CM | POA: Diagnosis not present

## 2022-04-18 DIAGNOSIS — N189 Chronic kidney disease, unspecified: Secondary | ICD-10-CM | POA: Diagnosis not present

## 2022-04-18 DIAGNOSIS — C7951 Secondary malignant neoplasm of bone: Secondary | ICD-10-CM | POA: Diagnosis not present

## 2022-04-18 DIAGNOSIS — C9000 Multiple myeloma not having achieved remission: Secondary | ICD-10-CM

## 2022-04-18 DIAGNOSIS — N289 Disorder of kidney and ureter, unspecified: Secondary | ICD-10-CM

## 2022-04-18 DIAGNOSIS — K219 Gastro-esophageal reflux disease without esophagitis: Secondary | ICD-10-CM | POA: Diagnosis not present

## 2022-04-18 DIAGNOSIS — Z5112 Encounter for antineoplastic immunotherapy: Secondary | ICD-10-CM | POA: Diagnosis not present

## 2022-04-18 DIAGNOSIS — C61 Malignant neoplasm of prostate: Secondary | ICD-10-CM | POA: Diagnosis not present

## 2022-04-18 DIAGNOSIS — Z8546 Personal history of malignant neoplasm of prostate: Secondary | ICD-10-CM | POA: Diagnosis not present

## 2022-04-18 DIAGNOSIS — Z452 Encounter for adjustment and management of vascular access device: Secondary | ICD-10-CM | POA: Diagnosis not present

## 2022-04-18 DIAGNOSIS — I82409 Acute embolism and thrombosis of unspecified deep veins of unspecified lower extremity: Secondary | ICD-10-CM

## 2022-04-18 DIAGNOSIS — Z8616 Personal history of COVID-19: Secondary | ICD-10-CM | POA: Diagnosis not present

## 2022-04-18 DIAGNOSIS — Z79899 Other long term (current) drug therapy: Secondary | ICD-10-CM | POA: Diagnosis not present

## 2022-04-18 DIAGNOSIS — Z8042 Family history of malignant neoplasm of prostate: Secondary | ICD-10-CM | POA: Diagnosis not present

## 2022-04-18 DIAGNOSIS — Z808 Family history of malignant neoplasm of other organs or systems: Secondary | ICD-10-CM | POA: Diagnosis not present

## 2022-04-18 DIAGNOSIS — Z86718 Personal history of other venous thrombosis and embolism: Secondary | ICD-10-CM | POA: Insufficient documentation

## 2022-04-18 HISTORY — PX: PORTACATH PLACEMENT: SHX2246

## 2022-04-18 SURGERY — INSERTION, TUNNELED CENTRAL VENOUS DEVICE, WITH PORT
Anesthesia: General | Site: Chest | Laterality: Left

## 2022-04-18 MED ORDER — CHLORHEXIDINE GLUCONATE CLOTH 2 % EX PADS: 6.0000 | MEDICATED_PAD | Freq: Once | CUTANEOUS | Status: DC

## 2022-04-18 MED ORDER — LIDOCAINE HCL (PF) 1 % IJ SOLN
INTRAMUSCULAR | Status: DC | PRN
Start: 2022-04-18 — End: 2022-04-18
  Administered 2022-04-18: 7 mL

## 2022-04-18 MED ORDER — LACTATED RINGERS IV SOLN: INTRAVENOUS | Status: DC

## 2022-04-18 MED ORDER — MIDAZOLAM HCL 2 MG/2ML IJ SOLN
INTRAMUSCULAR | Status: DC | PRN
  Administered 2022-04-18: 2 mg via INTRAVENOUS

## 2022-04-18 MED ORDER — PROPOFOL 10 MG/ML IV BOLUS
INTRAVENOUS | Status: DC | PRN
  Administered 2022-04-18: 20 mg via INTRAVENOUS

## 2022-04-18 MED ORDER — CHLORHEXIDINE GLUCONATE 0.12 % MT SOLN
15.0000 mL | Freq: Once | OROMUCOSAL | Status: AC
  Administered 2022-04-18: 15 mL via OROMUCOSAL

## 2022-04-18 MED ORDER — KETOROLAC TROMETHAMINE 30 MG/ML IJ SOLN: 15.0000 mg | Freq: Once | INTRAMUSCULAR | Status: DC

## 2022-04-18 MED ORDER — FENTANYL CITRATE (PF) 100 MCG/2ML IJ SOLN
INTRAMUSCULAR | Status: DC | PRN
  Administered 2022-04-18 (×4): 25 ug via INTRAVENOUS

## 2022-04-18 MED ORDER — LIDOCAINE HCL (PF) 1 % IJ SOLN
INTRAMUSCULAR | Status: AC
  Filled 2022-04-18: qty 30

## 2022-04-18 MED ORDER — MIDAZOLAM HCL 2 MG/2ML IJ SOLN
INTRAMUSCULAR | Status: AC
  Filled 2022-04-18: qty 2

## 2022-04-18 MED ORDER — ORAL CARE MOUTH RINSE: 15.0000 mL | Freq: Once | OROMUCOSAL | Status: AC

## 2022-04-18 MED ORDER — FENTANYL CITRATE (PF) 100 MCG/2ML IJ SOLN
INTRAMUSCULAR | Status: AC
  Filled 2022-04-18: qty 2

## 2022-04-18 MED ORDER — HEPARIN SOD (PORK) LOCK FLUSH 100 UNIT/ML IV SOLN
INTRAVENOUS | Status: AC
  Filled 2022-04-18: qty 5

## 2022-04-18 MED ORDER — LIDOCAINE HCL (CARDIAC) PF 100 MG/5ML IV SOSY
PREFILLED_SYRINGE | INTRAVENOUS | Status: DC | PRN
  Administered 2022-04-18: 40 mg via INTRAVENOUS

## 2022-04-18 MED ORDER — HEPARIN SOD (PORK) LOCK FLUSH 100 UNIT/ML IV SOLN
INTRAVENOUS | Status: DC | PRN
Start: 2022-04-18 — End: 2022-04-18
  Administered 2022-04-18: 500 [IU] via INTRAVENOUS

## 2022-04-18 MED ORDER — PROPOFOL 500 MG/50ML IV EMUL
INTRAVENOUS | Status: DC | PRN
  Administered 2022-04-18: 50 ug/kg/min via INTRAVENOUS

## 2022-04-18 MED ORDER — TRAMADOL HCL 50 MG PO TABS: 50.0000 mg | ORAL_TABLET | Freq: Four times a day (QID) | ORAL | 0 refills | Status: DC | PRN

## 2022-04-18 MED ORDER — SODIUM CHLORIDE (PF) 0.9 % IJ SOLN
INTRAMUSCULAR | Status: DC | PRN
Start: 2022-04-18 — End: 2022-04-18
  Administered 2022-04-18: 10 mL via INTRAVENOUS

## 2022-04-18 MED ORDER — CEFAZOLIN SODIUM-DEXTROSE 2-4 GM/100ML-% IV SOLN
2.0000 g | INTRAVENOUS | Status: AC
  Administered 2022-04-18: 2 g via INTRAVENOUS
  Filled 2022-04-18: qty 100

## 2022-04-18 SURGICAL SUPPLY — 31 items
ADH SKN CLS APL DERMABOND .7 (GAUZE/BANDAGES/DRESSINGS) ×1
APL PRP STRL LF ISPRP CHG 10.5 (MISCELLANEOUS) ×1
APPLICATOR CHLORAPREP 10.5 ORG (MISCELLANEOUS) ×3 IMPLANT
BAG DECANTER FOR FLEXI CONT (MISCELLANEOUS) ×3 IMPLANT
BLADE SURG SZ11 CARB STEEL (BLADE) ×1 IMPLANT
CLOTH BEACON ORANGE TIMEOUT ST (SAFETY) ×3 IMPLANT
COVER LIGHT HANDLE STERIS (MISCELLANEOUS) ×6 IMPLANT
DECANTER SPIKE VIAL GLASS SM (MISCELLANEOUS) ×3 IMPLANT
DERMABOND ADVANCED (GAUZE/BANDAGES/DRESSINGS) ×1
DERMABOND ADVANCED .7 DNX12 (GAUZE/BANDAGES/DRESSINGS) ×2 IMPLANT
DRAPE C-ARM FOLDED MOBILE STRL (DRAPES) ×3 IMPLANT
ELECT REM PT RETURN 9FT ADLT (ELECTROSURGICAL) ×2
ELECTRODE REM PT RTRN 9FT ADLT (ELECTROSURGICAL) ×2 IMPLANT
GLOVE BIOGEL PI IND STRL 7.0 (GLOVE) ×4 IMPLANT
GLOVE BIOGEL PI INDICATOR 7.0 (GLOVE) ×2
GLOVE SURG SS PI 7.5 STRL IVOR (GLOVE) ×3 IMPLANT
GOWN STRL REUS W/TWL LRG LVL3 (GOWN DISPOSABLE) ×6 IMPLANT
IV NS 500ML (IV SOLUTION) ×2
IV NS 500ML BAXH (IV SOLUTION) ×2 IMPLANT
KIT PORT POWER 8FR ISP MRI (Port) ×3 IMPLANT
KIT TURNOVER KIT A (KITS) ×3 IMPLANT
NDL HYPO 25X1 1.5 SAFETY (NEEDLE) ×2 IMPLANT
NEEDLE HYPO 25X1 1.5 SAFETY (NEEDLE) ×2 IMPLANT
PACK MINOR (CUSTOM PROCEDURE TRAY) ×3 IMPLANT
PAD ARMBOARD 7.5X6 YLW CONV (MISCELLANEOUS) ×3 IMPLANT
SET BASIN LINEN APH (SET/KITS/TRAYS/PACK) ×3 IMPLANT
SUT MNCRL AB 4-0 PS2 18 (SUTURE) ×3 IMPLANT
SUT VIC AB 3-0 SH 27 (SUTURE) ×2
SUT VIC AB 3-0 SH 27X BRD (SUTURE) ×2 IMPLANT
SYR 5ML LL (SYRINGE) ×3 IMPLANT
SYR CONTROL 10ML LL (SYRINGE) ×3 IMPLANT

## 2022-04-18 NOTE — Transfer of Care (Signed)
Immediate Anesthesia Transfer of Care Note ? ?Patient: Luis Stewart ? ?Procedure(s) Performed: INSERTION PORT-A-CATH (Left: Chest) ? ?Patient Location: Short Stay ? ?Anesthesia Type:General ? ?Level of Consciousness: awake, alert  and oriented ? ?Airway & Oxygen Therapy: Patient Spontanous Breathing ? ?Post-op Assessment: Report given to RN and Post -op Vital signs reviewed and stable ? ?Post vital signs: Reviewed and stable ? ?Last Vitals:  ?Vitals Value Taken Time  ?BP    ?Temp    ?Pulse    ?Resp    ?SpO2    ? ? ?Last Pain:  ?Vitals:  ? 04/18/22 0814  ?TempSrc: Oral  ?PainSc: 3   ?   ? ?Patients Stated Pain Goal: 6 (04/18/22 1594) ? ?Complications: No notable events documented. ?

## 2022-04-18 NOTE — Op Note (Signed)
Patient:  Luis Stewart ? ?DOB:  02/10/1951 ? ?MRN:  [id number] ? ? ?Preop Diagnosis: Multiple myeloma, need for central venous access ? ?Postop Diagnosis: Same ? ?Procedure: Port-A-Cath insertion ? ?Surgeon: Aviva Signs, MD ? ?Anes: MAC ? ?Indications: Patient is a 71 year old black male who presents for Port-A-Cath insertion.  He is about to undergo chemotherapy for multiple myeloma.  The risks and benefits of the procedure including bleeding, infection, and pneumothorax were fully explained to the patient, who gave informed consent. ? ?Procedure note: The patient was placed in the Trendelenburg position after the left upper chest was prepped and draped using usual sterile technique with ChloraPrep.  Surgical site confirmation was performed.  1% Xylocaine was used for local anesthesia. ? ?An incision was made below the left clavicle.  A subcutaneous pocket was formed.  A needle was advanced into the left subclavian vein using the Seldinger technique without difficulty.  A guidewire was then advanced into the right atrium under fluoroscopic guidance.  An introducer and peel-away sheath were placed over the guidewire.  The catheter was inserted through the peel-away sheath and the peel-away sheath was removed.  The catheter was then attached to the port and the port placed in subcutaneous pocket.  Adequate positioning was confirmed by fluoroscopy.  Good backflow of venous blood was noted on aspiration of the port.  The port was flushed with heparin flush.  The subcutaneous layer was reapproximated using a 3-0 Vicryl interrupted suture.  The skin was closed using a 4-0 Monocryl subcuticular suture.  Dermabond was applied. ? ?All tape and needle counts were correct at the end of the procedure.  The patient was awakened and transferred to PACU in stable condition.  A chest x-ray will be performed at that time. ? ?Complications: None ? ?EBL: Minimal ? ?Specimen: None ? ? ?  ?

## 2022-04-18 NOTE — Progress Notes (Signed)
Oncology nurse assessed two red, warm patches on patients left mid/lower abdomen.   ?Advised patient to call cancer center if the patches worsened. ?

## 2022-04-18 NOTE — Interval H&P Note (Signed)
History and Physical Interval Note: ? ?04/18/2022 ?8:36 AM ? 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  The various methods of treatment have been discussed with the patient and family. After consideration of risks, benefits and other options for treatment, the patient has consented to  Procedure(s): ?INSERTION PORT-A-CATH (Left) as a surgical intervention.  The patient's history has been reviewed, patient examined, no change in status, stable for surgery.  I have reviewed the patient's chart and labs.  Questions were answered to the patient's satisfaction.   ? ? ?Mark Jenkins ? ? ?

## 2022-04-18 NOTE — Anesthesia Preprocedure Evaluation (Addendum)
Anesthesia Evaluation  ?Patient identified by MRN, date of birth, ID band ?Patient awake ? ? ? ?Reviewed: ?Allergy & Precautions, NPO status , Patient's Chart, lab work & pertinent test results ? ?Airway ?Mallampati: II ? ? ?Neck ROM: Full ? ? ? Dental ? ?(+) Dental Advisory Given, Missing ?  ?Pulmonary ?PE ?  ?Pulmonary exam normal ?breath sounds clear to auscultation ? ? ? ? ? ? Cardiovascular ?Exercise Tolerance: Good ?+ DVT  ?Normal cardiovascular exam ?Rhythm:Regular Rate:Normal ? ? ?  ?Neuro/Psych ? Headaches,  Neuromuscular disease negative psych ROS  ? GI/Hepatic ?Neg liver ROS, GERD  Medicated,  ?Endo/Other  ?negative endocrine ROS ? Renal/GU ?Renal InsufficiencyRenal disease Bladder dysfunction (prostate cancer) ? ? ? ?  ?Musculoskeletal ? ?(+) Arthritis ,  ? Abdominal ?  ?Peds ?negative pediatric ROS ?(+)  Hematology ? ?(+) Blood dyscrasia (multiple myeloma), ,   ?Anesthesia Other Findings ? ? Reproductive/Obstetrics ?negative OB ROS ? ?  ? ? ? ? ? ? ? ? ? ? ? ? ? ?  ?  ? ? ? ? ? ? ? ?Anesthesia Physical ?Anesthesia Plan ? ?ASA: 3 ? ?Anesthesia Plan: General  ? ?Post-op Pain Management: Dilaudid IV  ? ?Induction: Intravenous ? ?PONV Risk Score and Plan: TIVA and Ondansetron ? ?Airway Management Planned: Nasal Cannula and Natural Airway ? ?Additional Equipment:  ? ?Intra-op Plan:  ? ?Post-operative Plan:  ? ?Informed Consent: I have reviewed the patients History and Physical, chart, labs and discussed the procedure including the risks, benefits and alternatives for the proposed anesthesia with the patient or authorized representative who has indicated his/her understanding and acceptance.  ? ? ? ?Dental advisory given ? ?Plan Discussed with: CRNA and Surgeon ? ?Anesthesia Plan Comments:   ? ? ? ? ? ?Anesthesia Quick Evaluation ? ?

## 2022-04-18 NOTE — Progress Notes (Signed)
Dr. Arnoldo Morale reviewed chest x-ray and confirmed correct placement of port-a-cath. ?

## 2022-04-18 NOTE — Progress Notes (Signed)
Dr. Arnoldo Morale came to the cancer center requesting a nurse come with him to evaluate Luis Stewart's injection site.  He was given Firmagon 240 mg and [place] on 04/15/2022.  The injection site was red, but not warm to the touch.  Patient stated that the redness seemed to be improving.  He denied any pain in the area, but some sensitivity to touch.  He denied any fever or other symptoms.  Patient was advised to contact our office if the redness worsened or if any new symptoms arose.   ?

## 2022-04-18 NOTE — Anesthesia Postprocedure Evaluation (Signed)
Anesthesia Post Note ? ?Patient: Luis Stewart ? ?Procedure(s) Performed: INSERTION PORT-A-CATH (Left: Chest) ? ?Patient location during evaluation: Phase II ?Anesthesia Type: General ?Level of consciousness: awake and alert and oriented ?Pain management: pain level controlled ?Vital Signs Assessment: post-procedure vital signs reviewed and stable ?Respiratory status: spontaneous breathing, nonlabored ventilation and respiratory function stable ?Cardiovascular status: blood pressure returned to baseline and stable ?Postop Assessment: no apparent nausea or vomiting ?Anesthetic complications: no ? ? ?No notable events documented. ? ? ?Last Vitals:  ?Vitals:  ? 04/18/22 0814 04/18/22 0931  ?BP: (!) 150/79 109/76  ?Pulse: (!) 56 60  ?Resp: 18 17  ?Temp: 36.9 ?C 36.5 ?C  ?SpO2: 100% 97%  ?  ?Last Pain:  ?Vitals:  ? 04/18/22 0931  ?TempSrc: Oral  ?PainSc: 0-No pain  ? ? ?  ?  ?  ?  ?  ?  ? ?Reveca Desmarais C Mikeila Burgen ? ? ? ? ?

## 2022-04-18 NOTE — Anesthesia Procedure Notes (Signed)
Date/Time: 04/18/2022 8:58 AM ?Performed by: Karna Dupes, CRNA ?Pre-anesthesia Checklist: Patient identified, Emergency Drugs available, Suction available and Patient being monitored ?Oxygen Delivery Method: Nasal cannula ? ? ? ? ?

## 2022-04-21 ENCOUNTER — Encounter (HOSPITAL_COMMUNITY): Payer: Self-pay | Admitting: Hematology

## 2022-04-21 LAB — PRETREATMENT RBC PHENOTYPE

## 2022-04-22 ENCOUNTER — Inpatient Hospital Stay (HOSPITAL_BASED_OUTPATIENT_CLINIC_OR_DEPARTMENT_OTHER): Payer: Medicare HMO | Admitting: Hematology

## 2022-04-22 ENCOUNTER — Telehealth (HOSPITAL_COMMUNITY): Payer: Self-pay | Admitting: Pharmacy Technician

## 2022-04-22 ENCOUNTER — Ambulatory Visit: Payer: Medicare HMO | Admitting: Urology

## 2022-04-22 ENCOUNTER — Inpatient Hospital Stay (HOSPITAL_COMMUNITY): Payer: Medicare HMO | Attending: Hematology

## 2022-04-22 ENCOUNTER — Encounter: Payer: Self-pay | Admitting: Urology

## 2022-04-22 ENCOUNTER — Encounter (HOSPITAL_COMMUNITY): Payer: Self-pay | Admitting: Hematology

## 2022-04-22 ENCOUNTER — Other Ambulatory Visit (HOSPITAL_COMMUNITY): Payer: Self-pay

## 2022-04-22 ENCOUNTER — Inpatient Hospital Stay (HOSPITAL_COMMUNITY): Payer: Medicare HMO

## 2022-04-22 ENCOUNTER — Telehealth (HOSPITAL_COMMUNITY): Payer: Self-pay | Admitting: Pharmacist

## 2022-04-22 ENCOUNTER — Encounter (HOSPITAL_COMMUNITY): Payer: Self-pay

## 2022-04-22 VITALS — BP 132/74 | HR 78

## 2022-04-22 VITALS — BP 145/62 | HR 67 | Temp 97.2°F | Resp 18

## 2022-04-22 DIAGNOSIS — C9000 Multiple myeloma not having achieved remission: Secondary | ICD-10-CM | POA: Insufficient documentation

## 2022-04-22 DIAGNOSIS — N401 Enlarged prostate with lower urinary tract symptoms: Secondary | ICD-10-CM | POA: Diagnosis not present

## 2022-04-22 DIAGNOSIS — Z8546 Personal history of malignant neoplasm of prostate: Secondary | ICD-10-CM

## 2022-04-22 DIAGNOSIS — Z5111 Encounter for antineoplastic chemotherapy: Secondary | ICD-10-CM | POA: Diagnosis not present

## 2022-04-22 DIAGNOSIS — C61 Malignant neoplasm of prostate: Secondary | ICD-10-CM | POA: Insufficient documentation

## 2022-04-22 DIAGNOSIS — Z79899 Other long term (current) drug therapy: Secondary | ICD-10-CM | POA: Diagnosis not present

## 2022-04-22 DIAGNOSIS — R350 Frequency of micturition: Secondary | ICD-10-CM | POA: Diagnosis not present

## 2022-04-22 DIAGNOSIS — Z5112 Encounter for antineoplastic immunotherapy: Secondary | ICD-10-CM | POA: Diagnosis present

## 2022-04-22 LAB — COMPREHENSIVE METABOLIC PANEL
ALT: 22 U/L (ref 0–44)
AST: 22 U/L (ref 15–41)
Albumin: 3.3 g/dL — ABNORMAL LOW (ref 3.5–5.0)
Alkaline Phosphatase: 71 U/L (ref 38–126)
Anion gap: 2 — ABNORMAL LOW (ref 5–15)
BUN: 24 mg/dL — ABNORMAL HIGH (ref 8–23)
CO2: 27 mmol/L (ref 22–32)
Calcium: 8.8 mg/dL — ABNORMAL LOW (ref 8.9–10.3)
Chloride: 102 mmol/L (ref 98–111)
Creatinine, Ser: 1.26 mg/dL — ABNORMAL HIGH (ref 0.61–1.24)
GFR, Estimated: >60 mL/min (ref >60)
Glucose, Bld: 101 mg/dL — ABNORMAL HIGH (ref 70–99)
Potassium: 3.8 mmol/L (ref 3.5–5.1)
Sodium: 131 mmol/L — ABNORMAL LOW (ref 135–145)
Total Bilirubin: 0.7 mg/dL (ref 0.3–1.2)
Total Protein: 10 g/dL — ABNORMAL HIGH (ref 6.5–8.1)

## 2022-04-22 LAB — CBC WITH DIFFERENTIAL/PLATELET
Abs Immature Granulocytes: 0.01 10*3/uL (ref 0.00–0.07)
Basophils Absolute: 0 10*3/uL (ref 0.0–0.1)
Basophils Relative: 0 %
Eosinophils Absolute: 0.1 10*3/uL (ref 0.0–0.5)
Eosinophils Relative: 2 %
HCT: 30.5 % — ABNORMAL LOW (ref 39.0–52.0)
Hemoglobin: 10.6 g/dL — ABNORMAL LOW (ref 13.0–17.0)
Immature Granulocytes: 0 %
Lymphocytes Relative: 26 %
Lymphs Abs: 1.3 10*3/uL (ref 0.7–4.0)
MCH: 31.4 pg (ref 26.0–34.0)
MCHC: 34.8 g/dL (ref 30.0–36.0)
MCV: 90.2 fL (ref 80.0–100.0)
Monocytes Absolute: 0.4 10*3/uL (ref 0.1–1.0)
Monocytes Relative: 7 %
Neutro Abs: 3.2 10*3/uL (ref 1.7–7.7)
Neutrophils Relative %: 65 %
Platelets: 147 10*3/uL — ABNORMAL LOW (ref 150–400)
RBC: 3.38 MIL/uL — ABNORMAL LOW (ref 4.22–5.81)
RDW: 17.4 % — ABNORMAL HIGH (ref 11.5–15.5)
WBC: 4.9 10*3/uL (ref 4.0–10.5)
nRBC: 0 % (ref 0.0–0.2)

## 2022-04-22 LAB — URINALYSIS, ROUTINE W REFLEX MICROSCOPIC
Bilirubin, UA: NEGATIVE
Glucose, UA: NEGATIVE
Ketones, UA: NEGATIVE
Leukocytes,UA: NEGATIVE
Nitrite, UA: NEGATIVE
Protein,UA: NEGATIVE
RBC, UA: NEGATIVE
Specific Gravity, UA: <1.005 — ABNORMAL LOW (ref 1.005–1.030)
Urobilinogen, Ur: 0.2 mg/dL (ref 0.2–1.0)
pH, UA: 5.5 (ref 5.0–7.5)

## 2022-04-22 LAB — MAGNESIUM: Magnesium: 2 mg/dL (ref 1.7–2.4)

## 2022-04-22 MED ORDER — SODIUM CHLORIDE 0.9 % IV SOLN
20.0000 mg | Freq: Once | INTRAVENOUS | Status: AC
  Administered 2022-04-22: 20 mg via INTRAVENOUS
  Filled 2022-04-22: qty 20

## 2022-04-22 MED ORDER — ACETAMINOPHEN 325 MG PO TABS
650.0000 mg | ORAL_TABLET | Freq: Once | ORAL | Status: AC
  Administered 2022-04-22: 650 mg via ORAL
  Filled 2022-04-22: qty 2

## 2022-04-22 MED ORDER — DIPHENHYDRAMINE HCL 25 MG PO CAPS
50.0000 mg | ORAL_CAPSULE | Freq: Once | ORAL | Status: AC
  Administered 2022-04-22: 50 mg via ORAL
  Filled 2022-04-22: qty 2

## 2022-04-22 MED ORDER — MONTELUKAST SODIUM 10 MG PO TABS
10.0000 mg | ORAL_TABLET | Freq: Once | ORAL | Status: AC
  Administered 2022-04-22: 10 mg via ORAL
  Filled 2022-04-22: qty 1

## 2022-04-22 MED ORDER — DEXTROSE 5 % IV SOLN
56.0000 mg/m2 | Freq: Once | INTRAVENOUS | Status: AC
  Administered 2022-04-22: 110 mg via INTRAVENOUS
  Filled 2022-04-22: qty 30

## 2022-04-22 MED ORDER — HEPARIN SOD (PORK) LOCK FLUSH 100 UNIT/ML IV SOLN
500.0000 [IU] | Freq: Once | INTRAVENOUS | Status: AC | PRN
  Administered 2022-04-22: 500 [IU]

## 2022-04-22 MED ORDER — DARATUMUMAB-HYALURONIDASE-FIHJ 1800-30000 MG-UT/15ML ~~LOC~~ SOLN
1800.0000 mg | Freq: Once | SUBCUTANEOUS | Status: AC
  Administered 2022-04-22: 1800 mg via SUBCUTANEOUS
  Filled 2022-04-22: qty 15

## 2022-04-22 MED ORDER — SODIUM CHLORIDE 0.9% FLUSH
10.0000 mL | INTRAVENOUS | Status: DC | PRN
  Administered 2022-04-22: 10 mL

## 2022-04-22 MED ORDER — SODIUM CHLORIDE 0.9 % IV SOLN: Freq: Once | INTRAVENOUS | Status: AC

## 2022-04-22 MED ORDER — LENALIDOMIDE 15 MG PO CAPS
15.0000 mg | ORAL_CAPSULE | Freq: Every day | ORAL | 0 refills | Status: DC
Start: 2022-04-22 — End: 2022-04-28

## 2022-04-22 NOTE — Telephone Encounter (Signed)
Oral Oncology Pharmacist Encounter ? ?Received new prescription for Revlimid (lenalidomide) for the treatment of IgG kappa multiple myeloma in conjunction with daratumumab, carfilzomib, and dexamethasone, planned duration until disease progression or unacceptable drug toxicity. ? ?CMP from 04/22/22 assessed, SCr elevated at 1.26, CrCl ~60 mL/min. Prescription dose and frequency assessed. Patient is started on a reduced dose of 15mg, this aligns with his borderline renal function.  ? ?Current medication list in Epic reviewed, no DDIs with lenalidomide identified: ? ?Evaluated chart and no patient barriers to medication adherence identified.  ? ?Oral Oncology Clinic will continue to follow for insurance authorization, copayment issues, initial counseling and start date. ? ? ?Alyson N. Leonard, PharmD, BCPS, BCOP, CPP ?Hematology/Oncology Clinical Pharmacist Practitioner ?[facility] Chemotherapy [facility] ?[phone] ? ?04/22/2022 12:17 PM ? ?

## 2022-04-22 NOTE — Progress Notes (Signed)
Patient has been examined by Dr. Katragadda, and vital signs and labs have been reviewed. ANC, Creatinine, LFTs, hemoglobin, and platelets are within treatment parameters per M.D. - pt may proceed with treatment.    °

## 2022-04-22 NOTE — Progress Notes (Signed)
History of Present Illness:  ? ?Prior history of elevated PSA--s/p TRUS/Bx in [place] in 2005 and 2008. Apparently, all cores were negative but some were not indicative of prostate tissue at all.  ? ?Because of a high PCA 3 test in this office, he underwent TRUS/Bx on 6.23.2016. 1/12 cores were positive for adenocarcinoma-10% of the core in the right lateral base revealed Gleason 3+3 equal 6 adenocarcinoma. Prostate volume was 175 cc. PSA was 16.66. PSA density 0.09. He does not have significant lower urinary tract symptomatology.  ? ?He consulted with Dr. Louis Meckel in [place] to consider robotic prostatectomy. Due to his low volume prostate cancer, and very low PSA density, it was recommended that he continue with active surveillance.  ? ?1.30.2017: Surveillance TRUS/Bx , following a prostatic MRI which revealed no suspicious prostatic lesions and correlated the patient's large prostate. At that time, all 12 cores were negative for prostate cancer. Prostatic volume was 190 mL.  ? ?8.21.2019: fusion TRUS/Bx. Recent prostate MRI--volume 220 ml. No significant lesions. No evidence transcapsular/perineural,SV/bony or lymphatic disease. Path--1 core (left apex) revealed GS 3+3 pattern in 5% of core.  ?  ?PSA followup: ?  ?10.18.2016--17.16 ?11.14.2017--15.4 ?5.25.2018--17.9 ?4.29.2019--18.4 ?2.25.2020--15.9 ?3.30.2021--15.5 ?3.30.2022--22.4 ?  ?9.24.2021 MRI prostate. Prostate volume 250 mL. ?No significant change compared to prior exam. No radiographic ?evidence of high-grade prostate carcinoma. PI-RADS 2: Low ?(clinically significant cancer is unlikely to be present) ?  ?4.5.2022: PSA 22.4. ?  ?10.4.2022:  PSA 34.5 this was drawn while the patient was being treated for an active COVID infection.  He was put on finasteride after his last visit.  He was unable to tolerate it due to worsening of his lower urinary tract symptoms. ? ?5.2.2023: Most recent PSA 17.87.  (Drawn on 04/10/2022) PSA drawn 6 weeks earlier was  21.1. ? ?Recently diagnosed with multiple myeloma and started targeted therapy recently.  Port-A-Cath placed last week.  Myeloma involves left iliac bone. ? ?Patient is actually feeling fairly well.  Quite optimistic.  No real urinary symptoms that are bothersome.  IPSS 11, quality-of-life score 3. ? ?Past Medical History:  ?Diagnosis Date  ? Arthritis   ? Asbestosis ([place])   ? Closed fracture of distal end of fibula with tibia with routine healing 05/28/2020  ? COVID-19 09/03/2021  ? Cyst of kidney, acquired   ? Headache   ? History of fracture of leg   ? Right, childhood  ? Hx of migraine headaches   ? Hyperlipidemia   ? Neuropathy   ? PE (pulmonary thromboembolism) ([place])   ? After knee surgery  ? Prostate cancer ([place]) 2015  ? Adenocarcinoma by biopsy  ? ? ?Past Surgical History:  ?Procedure Laterality Date  ? BIOPSY PROSTATE  2003 and 2005  ? COLONOSCOPY  11/28/2008  ? Dr. Rourk:internal hemorrhoids/tortus colon/ascending colon polyps, tubular adenomas  ? COLONOSCOPY N/A 09/04/2014  ? Procedure: COLONOSCOPY;  Surgeon: Daneil Dolin, MD;  [facility];  Service: Endoscopy;  Laterality: N/A;  9:30  ? COLONOSCOPY N/A 07/17/2017  ? Procedure: COLONOSCOPY;  Surgeon: Danie Binder, MD;  [facility];  Service: Endoscopy;  Laterality: N/A;  ? ESOPHAGOGASTRODUODENOSCOPY N/A 07/16/2017  ? Procedure: ESOPHAGOGASTRODUODENOSCOPY (EGD);  Surgeon: Danie Binder, MD;  [facility];  Service: Endoscopy;  Laterality: N/A;  ? GIVENS CAPSULE STUDY  07/16/2017  ? Procedure: GIVENS CAPSULE STUDY;  Surgeon: Danie Binder, MD;  [facility];  Service: Endoscopy;;  ? JOINT REPLACEMENT N/A   ?  Phreesia 01/12/2021  ? POLYPECTOMY  07/17/2017  ? Procedure: POLYPECTOMY;  Surgeon: Danie Binder, MD;  [facility];  Service: Endoscopy;;  cecal  ? TOTAL KNEE ARTHROPLASTY Right 06/10/2017  ? TOTAL KNEE ARTHROPLASTY Right 06/10/2017  ? Procedure: TOTAL KNEE ARTHROPLASTY;  Surgeon: [name], MD;  Location: [facility];  Service: Orthopedics;  Laterality: Right;  ? ? ?Home Medications:  ?Allergies as of 04/22/2022   ? ?   Reactions  ? Sulfa Antibiotics Other (See Comments)  ? Unknown reaction. Childhood reaction  ? ?  ? ?  ?Medication List  ?  ? ?  ? Accurate as of Apr 22, 2022  1:07 PM. If you have any questions, ask your nurse or doctor.  ?  ?  ? ?  ? ?acetaminophen 500 MG tablet ?Commonly known as: TYLENOL ?Take 1,000 mg by mouth every 6 (six) hours as needed for mild pain or moderate pain. ?  ?acyclovir 400 MG tablet ?Commonly known as: ZOVIRAX ?Take 1 tablet (400 mg total) by mouth 2 (two) times daily. ?  ?benzonatate 100 MG capsule ?Commonly known as: TESSALON ?Take 1 capsule (100 mg total) by mouth 2 (two) times daily as needed for cough. ?  ?daratumumab-hyaluronidase-fihj 1800-30000 MG-UT/15ML Soln ?Commonly known as: DARZALEX FASPRO ?Inject 1,800 mg into the skin once a week. ?  ?KYPROLIS IV ?Inject into the vein once a week. ?  ?lenalidomide 15 MG capsule ?Commonly known as: REVLIMID ?Take 1 capsule (15 mg total) by mouth daily. 21 days on, 7 days off ?Started by: Brien Mates, RN ?  ?lidocaine-prilocaine cream ?Commonly known as: EMLA ?Apply a small amount to port a cath site and cover with plastic wrap (do not rub in) 1 hour prior to infusion appointments ?  ?methocarbamol 500 MG tablet ?Commonly known as: ROBAXIN ?Take 1 tablet (500 mg total) by mouth 3 (three) times daily. ?What changed:  ?when to take this ?reasons to take this ?  ?multivitamin with minerals Tabs tablet ?Take 1 tablet by mouth daily with breakfast. Centrum Silver for Men ?  ?nitroGLYCERIN 0.4 MG SL tablet ?Commonly known as: NITROSTAT ?Place 1 tablet (0.4 mg total) under the tongue every 5 (five) minutes as needed for chest pain. ?  ?pantoprazole 40 MG tablet ?Commonly known as: PROTONIX ?Take 1 tablet (40 mg total) by mouth every other day. ?  ?prochlorperazine 10 MG tablet ?Commonly known as: COMPAZINE ?Take 1  tablet (10 mg total) by mouth every 6 (six) hours as needed for nausea or vomiting. ?  ?traMADol 50 MG tablet ?Commonly known as: Ultram ?Take 1 tablet (50 mg total) by mouth every 6 (six) hours as needed. ?  ?Vitamin D 50 MCG (2000 UT) Caps ?Take 2,000 Units by mouth every other day. ?  ? ?  ? ? ?Allergies:  ?Allergies  ?Allergen Reactions  ? Sulfa Antibiotics Other (See Comments)  ?  Unknown reaction. Childhood reaction  ? ? ?Family History  ?Problem Relation Age of Onset  ? Diabetes Mother   ? Hypertension Mother   ? Obesity Mother   ? Heart disease Mother   ? Mental illness Mother   ? Hypertension Sister   ? Obesity Sister   ? Arthritis Sister   ? Deep vein thrombosis Father   ? Dementia Father   ? Heart disease Father   ?     CABG  ? Colon cancer Neg Hx   ? ? ?Social History:  reports that he has never  smoked. He has never used smokeless tobacco. He reports that he does not drink alcohol and does not use drugs. ? ?ROS: ?A complete review of systems was performed.  All systems are negative except for pertinent findings as noted. ? ?Physical Exam:  ?Vital signs in last 24 hours: ?There were no vitals taken for this visit. ?Constitutional:  Alert and oriented, No acute distress ?Cardiovascular: Regular rate  ?Respiratory: Normal respiratory effort ?Neurologic: Grossly intact, no focal deficits ?Psychiatric: Normal mood and affect ? ?I have reviewed prior pt notes ? ?I have reviewed notes from oncologist's ? ?I have reviewed urinalysis results ? ?I have independently reviewed prior imaging-PSMA PET scan was negative for metastatic prostate cancer ? ?I have reviewed prior PSA results ? ? ?Impression/Assessment:  ?1.  BPH with huge gland, patient altogether voiding well ? ?2.  Very low volume, low risk prostate cancer, on active surveillance.  He does have a high PSA but prostate volume over 200 mL. ? ?3.  Recent new diagnosis of multiple myeloma ? ?Plan:  ?1.  I did tell the patient he should still be followed with  active surveillance, but I do not think repeat MRI or biopsy is necessary now as his PSA is quite stable ? ?2.  I will see back in 6 months following PSA.  If this needs to be drawn before then by Dr. Ames Coupe

## 2022-04-22 NOTE — Progress Notes (Signed)
Patient presents today for Kyprolis infusion and Daratumumab injection per providers order.  Vital signs and labs within parameters for treatment.  ? ?Message received from Anastasio Champion RN/Dr. Delton Coombes patient okay for treatment. ? ?Kyprolis and Daratumumab given today per MD orders.  Stable during infusion without adverse affects.   Injection site WNL; see MAR for injection details.  Patient tolerated procedure well and without incident.   Vital signs stable.  No complaints at this time.  Discharge from clinic ambulatory in stable condition.  Alert and oriented X 3.  Follow up with [facility] as scheduled.  ? ? ?

## 2022-04-22 NOTE — Patient Instructions (Addendum)
[facility] at [facility] ?Discharge Instructions ? ? ?You were seen and examined today by Dr. Delton Coombes. ? ?He reviewed your lab results with you which are stable.  ? ?We will proceed with your treatment today. ? ?Return as scheduled.  ? ? ? ?Thank you for choosing [place] at [facility] to provide your oncology and hematology care.  To afford each patient quality time with our provider, please arrive at least 15 minutes before your scheduled appointment time.  ? ?If you have a lab appointment with the [facility] please come in thru the Main Entrance and check in at the main information desk. ? ?You need to re-schedule your appointment should you arrive 10 or more minutes late.  We strive to give you quality time with our providers, and arriving late affects you and other patients whose appointments are after yours.  Also, if you no show three or more times for appointments you may be dismissed from the clinic at the providers discretion.     ?Again, thank you for choosing [facility].  Our hope is that these requests will decrease the amount of time that you wait before being seen by our physicians.       ?_____________________________________________________________ ? ?Should you have questions after your visit to [facility], please contact our office at [phone] and follow the prompts.  Our office hours are 8:00 a.m. and 4:30 p.m. Monday - Friday.  Please note that voicemails left after 4:00 p.m. may not be returned until the following business day.  We are closed weekends and major holidays.  You do have access to a nurse 24-7, just call the main number to the clinic [phone] and do not press any options, hold on the line and a nurse will answer the phone.   ? ?For prescription refill requests, have your pharmacy contact our office and allow 72 hours.   ? ?Due to Covid, you will need to wear a mask upon entering the  hospital. If you do not have a mask, a mask will be given to you at the Main Entrance upon arrival. For doctor visits, patients may have 1 support person age 61 or older with them. For treatment visits, patients can not have anyone with them due to social distancing guidelines and our immunocompromised population.  ? ?   ?

## 2022-04-22 NOTE — Patient Instructions (Signed)
[employer]  Discharge Instructions: ?Thank you for choosing [facility] to provide your oncology and hematology care.  ?If you have a lab appointment with the [facility], please come in thru the Main Entrance and check in at the main information desk. ? ?Wear comfortable clothing and clothing appropriate for easy access to any Portacath or PICC line.  ? ?We strive to give you quality time with your provider. You may need to reschedule your appointment if you arrive late (15 or more minutes).  Arriving late affects you and other patients whose appointments are after yours.  Also, if you miss three or more appointments without notifying the office, you may be dismissed from the clinic at the provider?s discretion.    ?  ?For prescription refill requests, have your pharmacy contact our office and allow 72 hours for refills to be completed.   ? ?Today you received the following chemotherapy and/or immunotherapy agents Kyprolis and daratumumab    ?  ?To help prevent nausea and vomiting after your treatment, we encourage you to take your nausea medication as directed. ? ?BELOW ARE SYMPTOMS THAT SHOULD BE REPORTED IMMEDIATELY: ?*FEVER GREATER THAN 100.4 F (38 ?C) OR HIGHER ?*CHILLS OR SWEATING ?*NAUSEA AND VOMITING THAT IS NOT CONTROLLED WITH YOUR NAUSEA MEDICATION ?*UNUSUAL SHORTNESS OF BREATH ?*UNUSUAL BRUISING OR BLEEDING ?*URINARY PROBLEMS (pain or burning when urinating, or frequent urination) ?*BOWEL PROBLEMS (unusual diarrhea, constipation, pain near the anus) ?TENDERNESS IN MOUTH AND THROAT WITH OR WITHOUT PRESENCE OF ULCERS (sore throat, sores in mouth, or a toothache) ?UNUSUAL RASH, SWELLING OR PAIN  ?UNUSUAL VAGINAL DISCHARGE OR ITCHING  ? ?Items with * indicate a potential emergency and should be followed up as soon as possible or go to the Emergency Department if any problems should occur. ? ?Please show the CHEMOTHERAPY ALERT CARD or IMMUNOTHERAPY ALERT CARD at check-in to the  Emergency Department and triage nurse. ? ?Should you have questions after your visit or need to cancel or reschedule your appointment, please contact [facility] [phone]  and follow the prompts.  Office hours are 8:00 a.m. to 4:30 p.m. Monday - Friday. Please note that voicemails left after 4:00 p.m. may not be returned until the following business day.  We are closed weekends and major holidays. You have access to a nurse at all times for urgent questions. Please call the main number to the clinic [phone] and follow the prompts. ? ?For any non-urgent questions, you may also contact your provider using MyChart. We now offer e-Visits for anyone 45 and older to request care online for non-urgent symptoms. For details visit [email or web address]. ?  ?Also download the MyChart app! Go to the app store, search "MyChart", open the app, select [facility], and log in with your MyChart username and password. ? ?Due to Covid, a mask is required upon entering the hospital/clinic. If you do not have a mask, one will be given to you upon arrival. For doctor visits, patients may have 1 support person aged 64 or older with them. For treatment visits, patients cannot have anyone with them due to current Covid guidelines and our immunocompromised population.  ?

## 2022-04-22 NOTE — Telephone Encounter (Signed)
Oral Oncology Patient Advocate Encounter ?  ?Received notification from [facility]) that prior authorization for Lenalidomide (Revlimid) is required. ?  ?PA submitted on CoverMyMeds ?Key [id number]  ?Status is pending ?  ?Oral Oncology Clinic will continue to follow. ? ?Dennison Nancy CPHT ?Specialty Pharmacy Patient Advocate ?[facility] ?Phone [phone] ?Fax [phone] ?04/22/2022 2:42 PM ? ?

## 2022-04-22 NOTE — Telephone Encounter (Signed)
Order for revlimid sent per verbal order from Dr. Delton Coombes ?

## 2022-04-22 NOTE — Progress Notes (Signed)
? ?[facility] ?[address] ?[place], [place] [address] ? ? ?CLINIC:  ?Medical Oncology/Hematology ? ?PCP:  ?Lindell Spar, MD ?[address] / [place] [address] ?[phone] ? ? ?REASON FOR VISIT:  ?Follow-up for prostate cancer and multiple myeloma ? ?PRIOR THERAPY: none ? ?NGS Results: not done ? ?CURRENT THERAPY: Carfilzomib (20/70) + Daratumumab SQ + Dexamethasone (20/40) DaraKd q28d ? ?BRIEF ONCOLOGIC HISTORY:  ?Oncology History  ?Multiple myeloma ([place])  ?04/09/2022 Initial Diagnosis  ? Multiple myeloma ([place]) ? ?  ?04/15/2022 -  Chemotherapy  ? Patient is on Treatment Plan : MYELOMA RELAPSED/REFRACTORY Carfilzomib (20/70) + Daratumumab SQ + Dexamethasone (20/40) DaraKd q28d  ? ?  ?  ? ? ?CANCER STAGING: ? Cancer Staging  ?Multiple myeloma ([place]) ?Staging form: Plasma Cell Myeloma and Plasma Cell Disorders, AJCC 8th Edition ?- Clinical: No stage assigned - Unsigned ? ? ?INTERVAL HISTORY:  ?Mr. [name], a 71 y.o. male, returns for routine follow-up and consideration for next cycle of chemotherapy. [place] was last seen on 04/09/2022. ? ?Due for day #8 cycle #1 of carfilzomib and Darzalex Faspro today.  ? ?Overall, he tells me he has been feeling pretty well, and he is accompanied by his wife and daughter. He reports 2 knots on his abdomen which are tender to touch. He denies fevers, fatigue, and hot flashes. He has not yet started Revlimid.  ? ?Overall, he feels ready for next cycle of chemo today.  ? ?REVIEW OF SYSTEMS:  ?Review of Systems  ?Constitutional:  Negative for appetite change, fatigue and fever.  ?Respiratory:  Positive for cough.   ?Cardiovascular:  Positive for chest pain (3/10 sore).  ?Endocrine: Negative for hot flashes.  ?Genitourinary:  Positive for dysuria.   ?Neurological:  Positive for numbness.  ?Psychiatric/Behavioral:  Positive for sleep disturbance.   ?All other systems reviewed and are negative. ? ?PAST MEDICAL/SURGICAL HISTORY:  ?Past Medical  History:  ?Diagnosis Date  ? Arthritis   ? Asbestosis ([place])   ? Closed fracture of distal end of fibula with tibia with routine healing 05/28/2020  ? COVID-19 09/03/2021  ? Cyst of kidney, acquired   ? Headache   ? History of fracture of leg   ? Right, childhood  ? Hx of migraine headaches   ? Hyperlipidemia   ? Neuropathy   ? PE (pulmonary thromboembolism) ([place])   ? After knee surgery  ? Prostate cancer ([place]) 2015  ? Adenocarcinoma by biopsy  ? ?Past Surgical History:  ?Procedure Laterality Date  ? BIOPSY PROSTATE  2003 and 2005  ? COLONOSCOPY  11/28/2008  ? Dr. Rourk:internal hemorrhoids/tortus colon/ascending colon polyps, tubular adenomas  ? COLONOSCOPY N/A 09/04/2014  ? Procedure: COLONOSCOPY;  Surgeon: Daneil Dolin, MD;  [facility];  Service: Endoscopy;  Laterality: N/A;  9:30  ? COLONOSCOPY N/A 07/17/2017  ? Procedure: COLONOSCOPY;  Surgeon: Danie Binder, MD;  [facility];  Service: Endoscopy;  Laterality: N/A;  ? ESOPHAGOGASTRODUODENOSCOPY N/A 07/16/2017  ? Procedure: ESOPHAGOGASTRODUODENOSCOPY (EGD);  Surgeon: Danie Binder, MD;  [facility];  Service: Endoscopy;  Laterality: N/A;  ? GIVENS CAPSULE STUDY  07/16/2017  ? Procedure: GIVENS CAPSULE STUDY;  Surgeon: Danie Binder, MD;  [facility];  Service: Endoscopy;;  ? JOINT REPLACEMENT N/A   ? Phreesia 01/12/2021  ? POLYPECTOMY  07/17/2017  ? Procedure: POLYPECTOMY;  Surgeon: Danie Binder, MD;  [facility];  Service: Endoscopy;;  cecal  ?  TOTAL KNEE ARTHROPLASTY Right 06/10/2017  ? TOTAL KNEE ARTHROPLASTY Right 06/10/2017  ? Procedure: TOTAL KNEE ARTHROPLASTY;  Surgeon: [name], MD;  Location: [facility];  Service: Orthopedics;  Laterality: Right;  ? ? ?SOCIAL HISTORY:  ?Social History  ? ?Socioeconomic History  ? Marital status: Married  ?  Spouse name: Not on file  ? Number of children: Not on file  ? Years of education: Not on file  ? Highest education level: Not on file  ?Occupational  History  ? Occupation: Retired  ?Tobacco Use  ? Smoking status: Never  ? Smokeless tobacco: Never  ?Vaping Use  ? Vaping Use: Never used  ?Substance and Sexual Activity  ? Alcohol use: No  ? Drug use: No  ? Sexual activity: Yes  ?Other Topics Concern  ? Not on file  ?Social History Narrative  ? Not on file  ? ?Social Determinants of Health  ? ?Financial Resource Strain: Not on file  ?Food Insecurity: Not on file  ?Transportation Needs: No Transportation Needs  ? Lack of Transportation (Medical): No  ? Lack of Transportation (Non-Medical): No  ?Physical Activity: Sufficiently Active  ? Days of Exercise per Week: 4 days  ? Minutes of Exercise per Session: 60 min  ?Stress: Not on file  ?Social Connections: Moderately Integrated  ? Frequency of Communication with Friends and Family: More than three times a week  ? Frequency of Social Gatherings with Friends and Family: More than three times a week  ? Attends Religious Services: More than 4 times per year  ? Active Member of Clubs or Organizations: No  ? Attends Archivist Meetings: Never  ? Marital Status: Married  ?Intimate Partner Violence: Not At Risk  ? Fear of Current or Ex-Partner: No  ? Emotionally Abused: No  ? Physically Abused: No  ? Sexually Abused: No  ? ? ?FAMILY HISTORY:  ?Family History  ?Problem Relation Age of Onset  ? Diabetes Mother   ? Hypertension Mother   ? Obesity Mother   ? Heart disease Mother   ? Mental illness Mother   ? Hypertension Sister   ? Obesity Sister   ? Arthritis Sister   ? Deep vein thrombosis Father   ? Dementia Father   ? Heart disease Father   ?     CABG  ? Colon cancer Neg Hx   ? ? ?CURRENT MEDICATIONS:  ?Current Outpatient Medications  ?Medication Sig Dispense Refill  ? acetaminophen (TYLENOL) 500 MG tablet Take 1,000 mg by mouth every 6 (six) hours as needed for mild pain or moderate pain.    ? acyclovir (ZOVIRAX) 400 MG tablet Take 1 tablet (400 mg total) by mouth 2 (two) times daily. 60 tablet 6  ? benzonatate  (TESSALON) 100 MG capsule Take 1 capsule (100 mg total) by mouth 2 (two) times daily as needed for cough. 20 capsule 0  ? Carfilzomib (KYPROLIS IV) Inject into the vein once a week.    ? Cholecalciferol (VITAMIN D) 2000 units CAPS Take 2,000 Units by mouth every other day.    ? daratumumab-hyaluronidase-fihj (DARZALEX FASPRO) 1800-30000 MG-UT/15ML SOLN Inject 1,800 mg into the skin once a week.    ? methocarbamol (ROBAXIN) 500 MG tablet Take 1 tablet (500 mg total) by mouth 3 (three) times daily. (Patient taking differently: Take 500 mg by mouth every 8 (eight) hours as needed for muscle spasms.) 60 tablet 1  ? Multiple Vitamin (MULTIVITAMIN WITH MINERALS) TABS tablet Take 1 tablet by mouth daily  with breakfast. Centrum Silver for Men    ? pantoprazole (PROTONIX) 40 MG tablet Take 1 tablet (40 mg total) by mouth every other day. 90 tablet 0  ? traMADol (ULTRAM) 50 MG tablet Take 1 tablet (50 mg total) by mouth every 6 (six) hours as needed. 10 tablet 0  ? lidocaine-prilocaine (EMLA) cream Apply a small amount to port a cath site and cover with plastic wrap (do not rub in) 1 hour prior to infusion appointments (Patient not taking: Reported on 04/22/2022) 30 g 0  ? nitroGLYCERIN (NITROSTAT) 0.4 MG SL tablet Place 1 tablet (0.4 mg total) under the tongue every 5 (five) minutes as needed for chest pain. (Patient not taking: Reported on 04/22/2022) 30 tablet 0  ? prochlorperazine (COMPAZINE) 10 MG tablet Take 1 tablet (10 mg total) by mouth every 6 (six) hours as needed for nausea or vomiting. (Patient not taking: Reported on 04/22/2022) 30 tablet 3  ? ?No current facility-administered medications for this visit.  ? ? ?ALLERGIES:  ?Allergies  ?Allergen Reactions  ? Sulfa Antibiotics Other (See Comments)  ?  Unknown reaction. Childhood reaction  ? ? ?PHYSICAL EXAM:  ?Performance status (ECOG): 1 - Symptomatic but completely ambulatory ? ?There were no vitals filed for this visit. ?Wt Readings from Last 3 Encounters:  ?04/22/22  173 lb 9.6 oz (78.7 kg)  ?04/16/22 177 lb 1.6 oz (80.3 kg)  ?04/15/22 177 lb 1.6 oz (80.3 kg)  ? ?Physical Exam ?Vitals reviewed.  ?Constitutional:   ?   Appearance: Normal appearance.  ?Cardiovascular:  ?   Ra

## 2022-04-22 NOTE — Telephone Encounter (Signed)
Oral Oncology Patient Advocate Encounter ? ?Prior Authorization for Lenalidomide has been approved.   ? ?PA# [id number] ?Effective dates: 12/22/21 through 12/21/22 ? ?Patients co-pay is estimated at $4360.09.  ? ?Prescription has been sent to [facility]. ? ?Oral Oncology Clinic will continue to follow.  ? ?Dennison Nancy CPHT ?Specialty Pharmacy Patient Advocate ?[facility] ?Phone [phone] ?Fax [phone] ?04/22/2022 2:56 PM ? ?

## 2022-04-23 ENCOUNTER — Other Ambulatory Visit (HOSPITAL_COMMUNITY): Payer: Self-pay

## 2022-04-24 ENCOUNTER — Ambulatory Visit (HOSPITAL_COMMUNITY)
Admission: RE | Admit: 2022-04-24 | Discharge: 2022-04-24 | Disposition: A | Discharge status: Home / Self Care | Payer: Medicare HMO | Source: Ambulatory Visit | Attending: Hematology | Admitting: Hematology

## 2022-04-24 ENCOUNTER — Telehealth (HOSPITAL_COMMUNITY): Payer: Self-pay | Admitting: Pharmacy Technician

## 2022-04-24 DIAGNOSIS — Z0189 Encounter for other specified special examinations: Secondary | ICD-10-CM

## 2022-04-24 DIAGNOSIS — Z01818 Encounter for other preprocedural examination: Secondary | ICD-10-CM | POA: Diagnosis not present

## 2022-04-24 DIAGNOSIS — C9000 Multiple myeloma not having achieved remission: Secondary | ICD-10-CM

## 2022-04-24 DIAGNOSIS — C61 Malignant neoplasm of prostate: Secondary | ICD-10-CM | POA: Diagnosis not present

## 2022-04-24 DIAGNOSIS — I351 Nonrheumatic aortic (valve) insufficiency: Secondary | ICD-10-CM | POA: Diagnosis not present

## 2022-04-24 DIAGNOSIS — E785 Hyperlipidemia, unspecified: Secondary | ICD-10-CM | POA: Diagnosis not present

## 2022-04-24 LAB — ECHOCARDIOGRAM COMPLETE
AR max vel: 3.55 cm2
AV Area VTI: 3.19 cm2
AV Area mean vel: 3.35 cm2
AV Mean grad: 3 mmHg
AV Peak grad: 6.4 mmHg
Ao pk vel: 1.26 m/s
Area-P 1/2: 2.76 cm2
Calc EF: 57.7 %
MV VTI: 2.28 cm2
S' Lateral: 3.1 cm
Single Plane A2C EF: 61.7 %
Single Plane A4C EF: 52.7 %

## 2022-04-24 NOTE — Progress Notes (Signed)
*  PRELIMINARY RESULTS* ?Echocardiogram ?2D Echocardiogram has been performed. ? ?Luis Stewart ?04/24/2022, 3:04 PM ?

## 2022-04-24 NOTE — Telephone Encounter (Signed)
Oral Oncology Patient Advocate Encounter ? ?Spoke to patient on 5/3 to discuss completing an application for [employer] in an effort to reduce patient's out of pocket expense for Revlimid to $0.   ? ?[employer] application completed and faxed to [phone]. [employer] will conduct a benefits investigation for copay and OOP cost to patient and verify patient qualifies for the Patient [place]. ? ?[employer] phone number for follow up is [phone].  ? ?This encounter will be updated until final determination.  ? ?Dennison Nancy CPHT ?Specialty Pharmacy Patient Advocate ?[place] ?Phone [phone] ?Fax [phone] ?04/24/2022 9:32 AM ? ?

## 2022-04-26 ENCOUNTER — Emergency Department (HOSPITAL_COMMUNITY)
Admission: EM | Admit: 2022-04-26 | Discharge: 2022-04-26 | Disposition: A | Discharge status: Home / Self Care | Payer: Medicare HMO | Attending: Emergency Medicine | Admitting: Emergency Medicine

## 2022-04-26 ENCOUNTER — Encounter (HOSPITAL_COMMUNITY): Payer: Self-pay | Admitting: Emergency Medicine

## 2022-04-26 ENCOUNTER — Emergency Department (HOSPITAL_COMMUNITY): Payer: Medicare HMO

## 2022-04-26 DIAGNOSIS — Z8546 Personal history of malignant neoplasm of prostate: Secondary | ICD-10-CM | POA: Insufficient documentation

## 2022-04-26 DIAGNOSIS — M5136 Other intervertebral disc degeneration, lumbar region: Secondary | ICD-10-CM | POA: Diagnosis not present

## 2022-04-26 DIAGNOSIS — Z96651 Presence of right artificial knee joint: Secondary | ICD-10-CM | POA: Diagnosis not present

## 2022-04-26 DIAGNOSIS — Z8616 Personal history of COVID-19: Secondary | ICD-10-CM | POA: Diagnosis not present

## 2022-04-26 DIAGNOSIS — N400 Enlarged prostate without lower urinary tract symptoms: Secondary | ICD-10-CM | POA: Diagnosis not present

## 2022-04-26 DIAGNOSIS — I700 Atherosclerosis of aorta: Secondary | ICD-10-CM | POA: Diagnosis not present

## 2022-04-26 DIAGNOSIS — K7689 Other specified diseases of liver: Secondary | ICD-10-CM | POA: Diagnosis not present

## 2022-04-26 DIAGNOSIS — R339 Retention of urine, unspecified: Secondary | ICD-10-CM | POA: Diagnosis not present

## 2022-04-26 DIAGNOSIS — R3916 Straining to void: Secondary | ICD-10-CM | POA: Insufficient documentation

## 2022-04-26 LAB — COMPREHENSIVE METABOLIC PANEL
ALT: 18 U/L (ref 0–44)
AST: 21 U/L (ref 15–41)
Albumin: 3 g/dL — ABNORMAL LOW (ref 3.5–5.0)
Alkaline Phosphatase: 72 U/L (ref 38–126)
Anion gap: 2 — ABNORMAL LOW (ref 5–15)
BUN: 23 mg/dL (ref 8–23)
CO2: 25 mmol/L (ref 22–32)
Calcium: 8.3 mg/dL — ABNORMAL LOW (ref 8.9–10.3)
Chloride: 108 mmol/L (ref 98–111)
Creatinine, Ser: 1.12 mg/dL (ref 0.61–1.24)
GFR, Estimated: >60 mL/min (ref >60)
Glucose, Bld: 92 mg/dL (ref 70–99)
Potassium: 3.6 mmol/L (ref 3.5–5.1)
Sodium: 135 mmol/L (ref 135–145)
Total Bilirubin: 0.4 mg/dL (ref 0.3–1.2)
Total Protein: 8.2 g/dL — ABNORMAL HIGH (ref 6.5–8.1)

## 2022-04-26 LAB — CBC
HCT: 30.1 % — ABNORMAL LOW (ref 39.0–52.0)
Hemoglobin: 10 g/dL — ABNORMAL LOW (ref 13.0–17.0)
MCH: 30.3 pg (ref 26.0–34.0)
MCHC: 33.2 g/dL (ref 30.0–36.0)
MCV: 91.2 fL (ref 80.0–100.0)
Platelets: 88 10*3/uL — ABNORMAL LOW (ref 150–400)
RBC: 3.3 MIL/uL — ABNORMAL LOW (ref 4.22–5.81)
RDW: 17.4 % — ABNORMAL HIGH (ref 11.5–15.5)
WBC: 2.5 10*3/uL — ABNORMAL LOW (ref 4.0–10.5)
nRBC: 0 % (ref 0.0–0.2)

## 2022-04-26 LAB — URINALYSIS, ROUTINE W REFLEX MICROSCOPIC
Bilirubin Urine: NEGATIVE
Glucose, UA: NEGATIVE mg/dL
Hgb urine dipstick: NEGATIVE
Ketones, ur: NEGATIVE mg/dL
Leukocytes,Ua: NEGATIVE
Nitrite: NEGATIVE
Protein, ur: NEGATIVE mg/dL
Specific Gravity, Urine: 1.009 (ref 1.005–1.030)
pH: 5 (ref 5.0–8.0)

## 2022-04-26 MED ORDER — IOHEXOL 300 MG/ML  SOLN
100.0000 mL | Freq: Once | INTRAMUSCULAR | Status: AC | PRN
  Administered 2022-04-26: 100 mL via INTRAVENOUS

## 2022-04-26 MED ORDER — HEPARIN SOD (PORK) LOCK FLUSH 100 UNIT/ML IV SOLN
INTRAVENOUS | Status: AC
  Filled 2022-04-26: qty 5

## 2022-04-26 MED ORDER — HEPARIN SOD (PORK) LOCK FLUSH 100 UNIT/ML IV SOLN
500.0000 [IU] | Freq: Once | INTRAVENOUS | Status: AC
  Administered 2022-04-26: 500 [IU]

## 2022-04-26 NOTE — ED Notes (Signed)
ED Provider at bedside. 

## 2022-04-26 NOTE — ED Notes (Signed)
Pt walked to restroom 

## 2022-04-26 NOTE — ED Provider Notes (Signed)
?[place] DEPT ?[facility] Emergency Department ?Provider Note ?MRN:  [id number]  ?Arrival date & time: 04/26/22    ? ?Chief Complaint   ?Urinary Retention ?  ?History of Present Illness   ?Luis Stewart is a 71 y.o. year-old male with a history of prostate cancer presenting to the ED with chief complaint of urinary retention. ? ?New urinary retention over the past several hours.  Also having new right-sided back pain with radiation down the right leg.  No numbness or weakness to the arms or legs, no fever.  No abdominal pain. ? ?Review of Systems  ?A thorough review of systems was obtained and all systems are negative except as noted in the HPI and PMH.  ? ?Patient's Health History   ? ?Past Medical History:  ?Diagnosis Date  ? Arthritis   ? Asbestosis ([place])   ? Closed fracture of distal end of fibula with tibia with routine healing 05/28/2020  ? COVID-19 09/03/2021  ? Cyst of kidney, acquired   ? Headache   ? History of fracture of leg   ? Right, childhood  ? Hx of migraine headaches   ? Hyperlipidemia   ? Neuropathy   ? PE (pulmonary thromboembolism) ([place])   ? After knee surgery  ? Prostate cancer ([place]) 2015  ? Adenocarcinoma by biopsy  ?  ?Past Surgical History:  ?Procedure Laterality Date  ? BIOPSY PROSTATE  2003 and 2005  ? COLONOSCOPY  11/28/2008  ? Dr. Rourk:internal hemorrhoids/tortus colon/ascending colon polyps, tubular adenomas  ? COLONOSCOPY N/A 09/04/2014  ? Procedure: COLONOSCOPY;  Surgeon: Daneil Dolin, MD;  [facility];  Service: Endoscopy;  Laterality: N/A;  9:30  ? COLONOSCOPY N/A 07/17/2017  ? Procedure: COLONOSCOPY;  Surgeon: Danie Binder, MD;  [facility];  Service: Endoscopy;  Laterality: N/A;  ? ESOPHAGOGASTRODUODENOSCOPY N/A 07/16/2017  ? Procedure: ESOPHAGOGASTRODUODENOSCOPY (EGD);  Surgeon: Danie Binder, MD;  [facility];  Service: Endoscopy;  Laterality: N/A;  ? GIVENS CAPSULE STUDY  07/16/2017  ? Procedure: GIVENS CAPSULE  STUDY;  Surgeon: Danie Binder, MD;  [facility];  Service: Endoscopy;;  ? JOINT REPLACEMENT N/A   ? [employer] 01/12/2021  ? POLYPECTOMY  07/17/2017  ? Procedure: POLYPECTOMY;  Surgeon: Danie Binder, MD;  [facility];  Service: Endoscopy;;  cecal  ? PORTACATH PLACEMENT Left 04/18/2022  ? Procedure: INSERTION PORT-A-CATH;  Surgeon: Aviva Signs, MD;  Location: [facility] OR[facility];  Service: General;  Laterality: Left;  ? TOTAL KNEE ARTHROPLASTY Right 06/10/2017  ? TOTAL KNEE ARTHROPLASTY Right 06/10/2017  ? Procedure: TOTAL KNEE ARTHROPLASTY;  Surgeon: [name], MD;  Location: [facility];  Service: Orthopedics;  Laterality: Right;  ?  ?Family History  ?Problem Relation Age of Onset  ? Diabetes Mother   ? Hypertension Mother   ? Obesity Mother   ? Heart disease Mother   ? Mental illness Mother   ? Hypertension Sister   ? Obesity Sister   ? Arthritis Sister   ? Deep vein thrombosis Father   ? Dementia Father   ? Heart disease Father   ?     CABG  ? Colon cancer Neg Hx   ?  ?Social History  ? ?Socioeconomic History  ? Marital status: Married  ?  Spouse name: Not on file  ? Number of children: Not on file  ? Years of education: Not on file  ? Highest education level: Not on file  ?Occupational History  ?  Occupation: Retired  ?Tobacco Use  ? Smoking status: Never  ? Smokeless tobacco: Never  ?Vaping Use  ? Vaping Use: Never used  ?Substance and Sexual Activity  ? Alcohol use: No  ? Drug use: No  ? Sexual activity: Yes  ?Other Topics Concern  ? Not on file  ?Social History Narrative  ? Not on file  ? ?Social Determinants of Health  ? ?Financial Resource Strain: Not on file  ?Food Insecurity: Not on file  ?Transportation Needs: No Transportation Needs  ? Lack of Transportation (Medical): No  ? Lack of Transportation (Non-Medical): No  ?Physical Activity: Sufficiently Active  ? Days of Exercise per Week: 4 days  ? Minutes of Exercise per Session: 60 min  ?Stress: Not on file  ?Social Connections: Moderately  Integrated  ? Frequency of Communication with Friends and Family: More than three times a week  ? Frequency of Social Gatherings with Friends and Family: More than three times a week  ? Attends Religious Services: More than 4 times per year  ? Active Member of Clubs or Organizations: No  ? Attends Archivist Meetings: Never  ? Marital Status: Married  ?Intimate Partner Violence: Not At Risk  ? Fear of Current or Ex-Partner: No  ? Emotionally Abused: No  ? Physically Abused: No  ? Sexually Abused: No  ?  ? ?Physical Exam  ? ?Vitals:  ? 04/26/22 0412  ?BP: (!) 147/84  ?Pulse: 77  ?Resp: 17  ?Temp: 97.6 ?F (36.4 ?C)  ?SpO2: 100%  ?  ?CONSTITUTIONAL: Well-appearing, NAD ?NEURO/PSYCH:  Alert and oriented x 3, no focal deficits ?EYES:  eyes equal and reactive ?ENT/NECK:  no LAD, no JVD ?CARDIO: Regular rate, well-perfused, normal S1 and S2 ?PULM:  CTAB no wheezing or rhonchi ?GI/GU:  non-distended, non-tender ?MSK/SPINE:  No gross deformities, no edema ?SKIN:  no rash, atraumatic ? ? ?*Additional and/or pertinent findings included in MDM below ? ?Diagnostic and Interventional Summary  ? ? EKG Interpretation ? ?Date/Time:    ?Ventricular Rate:    ?PR Interval:    ?QRS Duration:   ?QT Interval:    ?QTC Calculation:   ?R Axis:     ?Text Interpretation:   ?  ? ?  ? ?Labs Reviewed  ?CBC - Abnormal; Notable for the following components:  ?    Result Value  ? WBC 2.5 (*)   ? RBC 3.30 (*)   ? Hemoglobin 10.0 (*)   ? HCT 30.1 (*)   ? RDW 17.4 (*)   ? Platelets 88 (*)   ? All other components within normal limits  ?COMPREHENSIVE METABOLIC PANEL - Abnormal; Notable for the following components:  ? Calcium 8.3 (*)   ? Total Protein 8.2 (*)   ? Albumin 3.0 (*)   ? Anion gap 2 (*)   ? All other components within normal limits  ?URINALYSIS, ROUTINE W REFLEX MICROSCOPIC  ?  ?CT ABDOMEN PELVIS W CONTRAST  ?Final Result  ?  ?CT L-SPINE NO CHARGE  ?Final Result  ?  ?  ?Medications  ?iohexol (OMNIPAQUE) 300 MG/ML solution 100 mL  (100 mLs Intravenous Contrast Given 04/26/22 0541)  ?  ? ?Procedures  /  Critical Care ?Procedures ? ?ED Course and Medical Decision Making  ?Initial Impression and Ddx ?Urinary retention, history of prostate cancer, question bladder outlet obstruction related to the prostate versus probably a less likely myelopathy from metastatic involvement of the spine.  Patient is having some back pain with radiation down  the right leg.  Will pursue CT imaging. ? ?Past medical/surgical history that increases complexity of ED encounter: Prostate cancer ? ?Interpretation of Diagnostics ?I personally reviewed the laboratory assessment and my interpretation is as follows: No significant blood count or electrolyte disturbance.  Urinalysis without infection ?   ?CT imaging revealing large prostate, likely some degree of bladder outlet obstruction.  No bony abnormalities of the spine. ? ?Patient Reassessment and Ultimate Disposition/Management ?Patient continues to look and feel well.  CT results discussed with patient.  Shared decision making was utilized regarding Foley catheter placement.  Patient's bladder scan revealed only 240 cc of urine and he has been able to urinate some volume 2 or 3 times here in the emergency department.  Patient preferred deferring catheter placement at this time, has close follow-up with his oncologist in 2 days and will discuss symptoms at that time.  Will return with any worsening symptoms. ? ?Patient management required discussion with the following services or consulting groups:  None ? ?Complexity of Problems Addressed ?Acute illness or injury that poses threat of life of bodily function ? ?Additional Data Reviewed and Analyzed ?Further history obtained from: ?Recent Consult notes ? ?Additional Factors Impacting ED Encounter Risk ?None ? ?Barth Kirks. Sedonia Small, MD ?[facility] Emergency Medicine ?[email or web address] ? ?Final Clinical Impressions(s) / ED Diagnoses  ? ?   ICD-10-CM   ?1. Straining on urination  R39.16   ?  ?  ?ED Discharge Orders   ? ? None  ? ?  ?  ? ?Discharge Instructions Discussed with and Provided to Patient:  ? ? ? ?Discharge Instructions   ? ?  ?You were eval

## 2022-04-26 NOTE — Discharge Instructions (Signed)
You were evaluated in the Emergency Department and after careful evaluation, we did not find any emergent condition requiring admission or further testing in the hospital. ? ?Your exam/testing today was overall reassuring.  Symptoms likely due to an enlarged prostate.  Recommend close follow-up with your oncologist and/or urologist to discuss your symptoms. ? ?Please return to the Emergency Department if you experience any worsening of your condition.  Thank you for allowing [place] to be a part of your care. ? ?

## 2022-04-26 NOTE — ED Triage Notes (Signed)
Pt c/o difficulty with urination that started tonight. Pt also c/o pain to right hip that radiates down right leg.  ?

## 2022-04-28 ENCOUNTER — Other Ambulatory Visit (HOSPITAL_COMMUNITY): Payer: Self-pay | Admitting: Pharmacist

## 2022-04-28 ENCOUNTER — Telehealth: Payer: Self-pay | Admitting: *Deleted

## 2022-04-28 DIAGNOSIS — C9000 Multiple myeloma not having achieved remission: Secondary | ICD-10-CM | POA: Diagnosis not present

## 2022-04-28 MED ORDER — LENALIDOMIDE 15 MG PO CAPS: 15.0000 mg | ORAL_CAPSULE | Freq: Every day | ORAL | 0 refills | Status: DC

## 2022-04-28 NOTE — Telephone Encounter (Signed)
Oral Oncology Patient Advocate Encounter ? ?Received notification from [facility] that patient has been successfully enrolled into their program to receive Revlimid from the manufacturer at $0 out of pocket until 12/21/22.  ?  ?Specialty Pharmacy that will dispense medication is [facility]. ? ?Patient knows to call the office with questions or concerns. ?  ?Oral Oncology Clinic will continue to follow. ? ?Dennison Nancy CPHT ?Specialty Pharmacy Patient Advocate ?[place] ?Phone [phone] ?Fax [phone] ?04/28/2022 12:07 PM ? ?

## 2022-04-28 NOTE — Telephone Encounter (Signed)
Followed up with patient post ER visit for urinary retention over the weekend.  He was advised to follow up with oncology.  He states he is better at this point.  Made him aware to notify urologist regarding ER visit in the event he needs to see him sooner.  Verbalized understanding.   ?

## 2022-04-29 ENCOUNTER — Inpatient Hospital Stay (HOSPITAL_COMMUNITY): Payer: Medicare HMO

## 2022-04-29 VITALS — BP 124/74 | HR 60 | Temp 97.4°F | Resp 18

## 2022-04-29 DIAGNOSIS — Z5111 Encounter for antineoplastic chemotherapy: Secondary | ICD-10-CM | POA: Diagnosis not present

## 2022-04-29 DIAGNOSIS — Z5112 Encounter for antineoplastic immunotherapy: Secondary | ICD-10-CM | POA: Diagnosis not present

## 2022-04-29 DIAGNOSIS — C9000 Multiple myeloma not having achieved remission: Secondary | ICD-10-CM | POA: Diagnosis not present

## 2022-04-29 DIAGNOSIS — Z79899 Other long term (current) drug therapy: Secondary | ICD-10-CM | POA: Diagnosis not present

## 2022-04-29 DIAGNOSIS — C61 Malignant neoplasm of prostate: Secondary | ICD-10-CM | POA: Diagnosis not present

## 2022-04-29 LAB — COMPREHENSIVE METABOLIC PANEL
ALT: 15 U/L (ref 0–44)
AST: 20 U/L (ref 15–41)
Albumin: 3 g/dL — ABNORMAL LOW (ref 3.5–5.0)
Alkaline Phosphatase: 83 U/L (ref 38–126)
Anion gap: 2 — ABNORMAL LOW (ref 5–15)
BUN: 18 mg/dL (ref 8–23)
CO2: 27 mmol/L (ref 22–32)
Calcium: 8.3 mg/dL — ABNORMAL LOW (ref 8.9–10.3)
Chloride: 110 mmol/L (ref 98–111)
Creatinine, Ser: 1.21 mg/dL (ref 0.61–1.24)
GFR, Estimated: >60 mL/min (ref >60)
Glucose, Bld: 126 mg/dL — ABNORMAL HIGH (ref 70–99)
Potassium: 3.5 mmol/L (ref 3.5–5.1)
Sodium: 139 mmol/L (ref 135–145)
Total Bilirubin: 0.5 mg/dL (ref 0.3–1.2)
Total Protein: 7.8 g/dL (ref 6.5–8.1)

## 2022-04-29 LAB — CBC WITH DIFFERENTIAL/PLATELET
Abs Immature Granulocytes: 0.01 10*3/uL (ref 0.00–0.07)
Basophils Absolute: 0 10*3/uL (ref 0.0–0.1)
Basophils Relative: 0 %
Eosinophils Absolute: 0.1 10*3/uL (ref 0.0–0.5)
Eosinophils Relative: 2 %
HCT: 28.4 % — ABNORMAL LOW (ref 39.0–52.0)
Hemoglobin: 9.7 g/dL — ABNORMAL LOW (ref 13.0–17.0)
Immature Granulocytes: 0 %
Lymphocytes Relative: 18 %
Lymphs Abs: 0.7 10*3/uL (ref 0.7–4.0)
MCH: 31.4 pg (ref 26.0–34.0)
MCHC: 34.2 g/dL (ref 30.0–36.0)
MCV: 91.9 fL (ref 80.0–100.0)
Monocytes Absolute: 0.3 10*3/uL (ref 0.1–1.0)
Monocytes Relative: 9 %
Neutro Abs: 2.5 10*3/uL (ref 1.7–7.7)
Neutrophils Relative %: 71 %
Platelets: 161 10*3/uL (ref 150–400)
RBC: 3.09 MIL/uL — ABNORMAL LOW (ref 4.22–5.81)
RDW: 17.9 % — ABNORMAL HIGH (ref 11.5–15.5)
WBC: 3.6 10*3/uL — ABNORMAL LOW (ref 4.0–10.5)
nRBC: 0 % (ref 0.0–0.2)

## 2022-04-29 LAB — MAGNESIUM: Magnesium: 2.1 mg/dL (ref 1.7–2.4)

## 2022-04-29 MED ORDER — SODIUM CHLORIDE 0.9% FLUSH
10.0000 mL | INTRAVENOUS | Status: DC | PRN
  Administered 2022-04-29: 10 mL

## 2022-04-29 MED ORDER — DEXTROSE 5 % IV SOLN
56.0000 mg/m2 | Freq: Once | INTRAVENOUS | Status: AC
  Administered 2022-04-29: 110 mg via INTRAVENOUS
  Filled 2022-04-29: qty 30

## 2022-04-29 MED ORDER — DIPHENHYDRAMINE HCL 25 MG PO CAPS
50.0000 mg | ORAL_CAPSULE | Freq: Once | ORAL | Status: AC
  Administered 2022-04-29: 50 mg via ORAL
  Filled 2022-04-29: qty 2

## 2022-04-29 MED ORDER — ACETAMINOPHEN 325 MG PO TABS
650.0000 mg | ORAL_TABLET | Freq: Once | ORAL | Status: AC
  Administered 2022-04-29: 650 mg via ORAL
  Filled 2022-04-29: qty 2

## 2022-04-29 MED ORDER — DARATUMUMAB-HYALURONIDASE-FIHJ 1800-30000 MG-UT/15ML ~~LOC~~ SOLN
1800.0000 mg | Freq: Once | SUBCUTANEOUS | Status: AC
  Administered 2022-04-29: 1800 mg via SUBCUTANEOUS
  Filled 2022-04-29: qty 15

## 2022-04-29 MED ORDER — HEPARIN SOD (PORK) LOCK FLUSH 100 UNIT/ML IV SOLN
500.0000 [IU] | Freq: Once | INTRAVENOUS | Status: AC | PRN
  Administered 2022-04-29: 500 [IU]

## 2022-04-29 MED ORDER — SODIUM CHLORIDE 0.9 % IV SOLN: Freq: Once | INTRAVENOUS | Status: AC

## 2022-04-29 MED ORDER — SODIUM CHLORIDE 0.9 % IV SOLN
20.0000 mg | Freq: Once | INTRAVENOUS | Status: AC
  Administered 2022-04-29: 20 mg via INTRAVENOUS
  Filled 2022-04-29: qty 2

## 2022-04-29 MED ORDER — MONTELUKAST SODIUM 10 MG PO TABS
10.0000 mg | ORAL_TABLET | Freq: Once | ORAL | Status: AC
  Administered 2022-04-29: 10 mg via ORAL
  Filled 2022-04-29: qty 1

## 2022-04-29 NOTE — Progress Notes (Signed)
Labs reviewed , they meet parameters for treatment. Will proceed as planned.  Treatment given per orders. Patient tolerated it well without problems. Vitals stable and discharged home from clinic ambulatory. Follow up as scheduled.  

## 2022-04-29 NOTE — Patient Instructions (Signed)
Luis Stewart  Discharge Instructions: ?Thank you for choosing [facility] to provide your oncology and hematology care.  ?If you have a lab appointment with the [facility], please come in thru the Main Entrance and check in at the main information desk. ? ?Wear comfortable clothing and clothing appropriate for easy access to any Portacath or PICC line.  ? ?We strive to give you quality time with your provider. You may need to reschedule your appointment if you arrive late (15 or more minutes).  Arriving late affects you and other patients whose appointments are after yours.  Also, if you miss three or more appointments without notifying the office, you may be dismissed from the clinic at the provider?s discretion.    ?  ?For prescription refill requests, have your pharmacy contact our office and allow 72 hours for refills to be completed.   ? ?Today you received the following chemotherapy and/or immunotherapy agents cytoxan, dara SQ    ?  ?To help prevent nausea and vomiting after your treatment, we encourage you to take your nausea medication as directed. ? ?BELOW ARE SYMPTOMS THAT SHOULD BE REPORTED IMMEDIATELY: ?*FEVER GREATER THAN 100.4 F (38 ?C) OR HIGHER ?*CHILLS OR SWEATING ?*NAUSEA AND VOMITING THAT IS NOT CONTROLLED WITH YOUR NAUSEA MEDICATION ?*UNUSUAL SHORTNESS OF BREATH ?*UNUSUAL BRUISING OR BLEEDING ?*URINARY PROBLEMS (pain or burning when urinating, or frequent urination) ?*BOWEL PROBLEMS (unusual diarrhea, constipation, pain near the anus) ?TENDERNESS IN MOUTH AND THROAT WITH OR WITHOUT PRESENCE OF ULCERS (sore throat, sores in mouth, or a toothache) ?UNUSUAL RASH, SWELLING OR PAIN  ?UNUSUAL VAGINAL DISCHARGE OR ITCHING  ? ?Items with * indicate a potential emergency and should be followed up as soon as possible or go to the Emergency Department if any problems should occur. ? ?Please show the CHEMOTHERAPY ALERT CARD or IMMUNOTHERAPY ALERT CARD at check-in to the Emergency  Department and triage nurse. ? ?Should you have questions after your visit or need to cancel or reschedule your appointment, please contact [facility] [phone]  and follow the prompts.  Office hours are 8:00 a.m. to 4:30 p.m. Monday - Friday. Please note that voicemails left after 4:00 p.m. may not be returned until the following business day.  We are closed weekends and major holidays. You have access to a nurse at all times for urgent questions. Please call the main number to the clinic [phone] and follow the prompts. ? ?For any non-urgent questions, you may also contact your provider using MyChart. We now offer e-Visits for anyone 52 and older to request care online for non-urgent symptoms. For details visit [email or web address]. ?  ?Also download the MyChart app! Go to the app store, search "MyChart", open the app, select [facility], and log in with your MyChart username and password. ? ?Due to Covid, a mask is required upon entering the hospital/clinic. If you do not have a mask, one will be given to you upon arrival. For doctor visits, patients may have 1 support person aged 46 or older with them. For treatment visits, patients cannot have anyone with them due to current Covid guidelines and our immunocompromised population.  ?

## 2022-04-29 NOTE — Progress Notes (Signed)
Patients port flushed without difficulty.  Good blood return noted with no bruising or swelling noted at site.  Stable during access and blood draw.  Patient to remain accessed for treatment. 

## 2022-04-30 ENCOUNTER — Ambulatory Visit (INDEPENDENT_AMBULATORY_CARE_PROVIDER_SITE_OTHER): Payer: Medicare HMO | Admitting: Physician Assistant

## 2022-04-30 VITALS — BP 131/55 | HR 76 | Ht 74.0 in | Wt 174.0 lb

## 2022-04-30 DIAGNOSIS — Z8546 Personal history of malignant neoplasm of prostate: Secondary | ICD-10-CM | POA: Diagnosis not present

## 2022-04-30 DIAGNOSIS — R350 Frequency of micturition: Secondary | ICD-10-CM | POA: Diagnosis not present

## 2022-04-30 DIAGNOSIS — N401 Enlarged prostate with lower urinary tract symptoms: Secondary | ICD-10-CM | POA: Diagnosis not present

## 2022-04-30 DIAGNOSIS — C61 Malignant neoplasm of prostate: Secondary | ICD-10-CM

## 2022-04-30 DIAGNOSIS — R339 Retention of urine, unspecified: Secondary | ICD-10-CM

## 2022-04-30 DIAGNOSIS — Z87898 Personal history of other specified conditions: Secondary | ICD-10-CM

## 2022-04-30 LAB — URINALYSIS, ROUTINE W REFLEX MICROSCOPIC
Bilirubin, UA: NEGATIVE
Glucose, UA: NEGATIVE
Ketones, UA: NEGATIVE
Leukocytes,UA: NEGATIVE
Nitrite, UA: NEGATIVE
Protein,UA: NEGATIVE
RBC, UA: NEGATIVE
Specific Gravity, UA: 1.01 (ref 1.005–1.030)
Urobilinogen, Ur: 0.2 mg/dL (ref 0.2–1.0)
pH, UA: 5.5 (ref 5.0–7.5)

## 2022-04-30 LAB — BLADDER SCAN AMB NON-IMAGING: Scan Result: 7

## 2022-04-30 MED ORDER — TAMSULOSIN HCL 0.4 MG PO CAPS: 0.4000 mg | ORAL_CAPSULE | Freq: Every day | ORAL | 11 refills | Status: DC

## 2022-04-30 NOTE — Telephone Encounter (Signed)
Oral Chemotherapy Pharmacist Encounter ? ?[name] reports that his Revlimid will be delivered tomorrow. He will contact the office/ask at his appt next week when he should start the Revlimid. ? ?Patient Education ?I spoke with patient for overview of new oral chemotherapy medication: Revlimid (lenalidomide) for the treatment of IgG kappa multiple myeloma in conjunction with daratumumab, carfilzomib, and dexamethasone, planned duration until disease progression or unacceptable drug toxicity.  ? ?Pt is doing well. Counseled patient on administration, dosing, side effects, monitoring, drug-food interactions, safe handling, storage, and disposal. ?Patient will take 1 capsule (15 mg total) by mouth daily. Take for 21 days, then hold for 7 days. Repeat every 28 days. ?**Patient instructed to pick up aspirin 28m  ? ?Side effects include but not limited to: rash, nausea, fatigue, constipation or diarrhea, decreased wbc/hgb/plt.   ? ?Reviewed with patient importance of keeping a medication schedule and plan for any missed doses. ? ?After discussion with patient no patient barriers to medication adherence identified.  ? ?[name] voiced understanding and appreciation. All questions answered. Medication handout provided. ? ?[facility] [place] [facility]. Patient knows to call the office with questions or concerns. Oral Chemotherapy Navigation Clinic will continue to follow. ? ?ADarl Pikes PharmD, BCPS, BCOP, CPP ?Hematology/Oncology Clinical Pharmacist Practitioner ?[place] Oral Chemotherapy [facility] ?[phone]? ?04/30/2022 4:18 PM ? ?

## 2022-04-30 NOTE — Progress Notes (Signed)
post void residual =60m ?

## 2022-04-30 NOTE — Progress Notes (Signed)
? ?Assessment: ?1. H/O urinary retention ?- BLADDER SCAN AMB NON-IMAGING ?- Urinalysis, Routine w reflex microscopic ? ?2. Prostate cancer ([place]) ? ?3. Benign prostatic hyperplasia with urinary frequency ?  ? ?Plan: ?Flomax 0.18m Rx sent to pharmacy and SE's discussed at length. Discussed findings and plan of care with pt at length. He will FU in 6-8 weeks for UA and PVR and keep 6 month FU as scheduled with Dr. DDiona Fantifor FU of prostate CA. ? ?Chief Complaint: ?No chief complaint on file. ? ? ?HPI: ?Luis Colsonis a 71y.o. male with h/o BPH and prostate CA and recent dx of multiple myeloma who presents for continued evaluation of persistent urinary frequency, burning, intermittency, hesitancy, straining to urinate, weakness of stream.  No gross hematuria.  The patient was seen in the emergency department on 04/26/2022 feeling like he could not void.  Patient was noted to have 240 mL of urine upon presentation, but voided significant amount of urine several times during the ED course.  No Foley was placed.  CT of the abdomen pelvis indicated large prostate with bladder outlet obstruction.  Notes, labs, images personally reviewed during patient's office visit today. ?PVR=787m?UA clear ?IPSS = 19, quality-of-life = 5, 2 episodes of nocturia. ? ? ?04/22/22 ?Prior history of elevated PSA--s/p TRUS/Bx in [place] 2005 and 2008. Apparently, all cores were negative but some were not indicative of prostate tissue at all.  ? ?Because of a high PCA 3 test in this office, he underwent TRUS/Bx on 6.23.2016. 1/12 cores were positive for adenocarcinoma-10% of the core in the right lateral base revealed Gleason 3+3 equal 6 adenocarcinoma. Prostate volume was 175 cc. PSA was 16.66. PSA density 0.09. He does not have significant lower urinary tract symptomatology.  ? ?He consulted with Dr. HeLouis Meckeln [place] consider robotic prostatectomy. Due to his low volume prostate cancer, and very low PSA density,  it was recommended that he continue with active surveillance.  ? ?1.30.2017: Surveillance TRUS/Bx , following a prostatic MRI which revealed no suspicious prostatic lesions and correlated the patient's large prostate. At that time, all 12 cores were negative for prostate cancer. Prostatic volume was 190 mL.  ? ?8.21.2019: fusion TRUS/Bx. Recent prostate MRI--volume 220 ml. No significant lesions. No evidence transcapsular/perineural,SV/bony or lymphatic disease. Path--1 core (left apex) revealed GS 3+3 pattern in 5% of core.  ?  ?PSA followup: ?  ?10.18.2016--17.16 ?11.14.2017--15.4 ?5.25.2018--17.9 ?4.29.2019--18.4 ?2.25.2020--15.9 ?3.30.2021--15.5 ?3.30.2022--22.4 ?  ?9.24.2021 MRI prostate. Prostate volume 250 mL. ?No significant change compared to prior exam. No radiographic ?evidence of high-grade prostate carcinoma. PI-RADS 2: Low ?(clinically significant cancer is unlikely to be present) ?  ?4.5.2022: PSA 22.4. ?  ?10.4.2022:  PSA 34.5 this was drawn while the patient was being treated for an active COVID infection.  He was put on finasteride after his last visit.  He was unable to tolerate it due to worsening of his lower urinary tract symptoms. ?  ?5.2.2023: Most recent PSA 17.87.  (Drawn on 04/10/2022) PSA drawn 6 weeks earlier was 21.1. ? ?Recently diagnosed with multiple myeloma and started targeted therapy recently.  Port-A-Cath placed last week.  Myeloma involves left iliac bone. ? ?Patient is actually feeling fairly well.  Quite optimistic.  No real urinary symptoms that are bothersome.  IPSS 11, quality-of-life score 3. ?  ?Portions of the above documentation were copied from a prior visit for review purposes only. ? ?Allergies: ?Allergies  ?Allergen Reactions  ? Sulfa Antibiotics Other (See Comments)  ?  Unknown reaction. Childhood reaction  ? ? ?PMH: ?Past Medical History:  ?Diagnosis Date  ? Arthritis   ? Asbestosis ([place])   ? Closed fracture of distal end of fibula with tibia with routine healing  05/28/2020  ? COVID-19 09/03/2021  ? Cyst of kidney, acquired   ? Headache   ? History of fracture of leg   ? Right, childhood  ? Hx of migraine headaches   ? Hyperlipidemia   ? Neuropathy   ? PE (pulmonary thromboembolism) ([place])   ? After knee surgery  ? Prostate cancer ([place]) 2015  ? Adenocarcinoma by biopsy  ? ? ?PSH: ?Past Surgical History:  ?Procedure Laterality Date  ? BIOPSY PROSTATE  2003 and 2005  ? COLONOSCOPY  11/28/2008  ? Dr. Rourk:internal hemorrhoids/tortus colon/ascending colon polyps, tubular adenomas  ? COLONOSCOPY N/A 09/04/2014  ? Procedure: COLONOSCOPY;  Surgeon: Daneil Dolin, MD;  [facility];  Service: Endoscopy;  Laterality: N/A;  9:30  ? COLONOSCOPY N/A 07/17/2017  ? Procedure: COLONOSCOPY;  Surgeon: Danie Binder, MD;  [facility];  Service: Endoscopy;  Laterality: N/A;  ? ESOPHAGOGASTRODUODENOSCOPY N/A 07/16/2017  ? Procedure: ESOPHAGOGASTRODUODENOSCOPY (EGD);  Surgeon: Danie Binder, MD;  [facility];  Service: Endoscopy;  Laterality: N/A;  ? GIVENS CAPSULE STUDY  07/16/2017  ? Procedure: GIVENS CAPSULE STUDY;  Surgeon: Danie Binder, MD;  [facility];  Service: Endoscopy;;  ? JOINT REPLACEMENT N/A   ? [employer]rees[employer] 01/12/2021  ? POLYPECTOMY  07/17/2017  ? Procedure: POLYPECTOMY;  Surgeon: Danie Binder, MD;  [facility];  Service: Endoscopy;;  cecal  ? PORTACATH PLACEMENT Left 04/18/2022  ? Procedure: INSERTION PORT-A-CATH;  Surgeon: Aviva Signs, MD;  Location: [facility];  Service: General;  Laterality: Left;  ? TOTAL KNEE ARTHROPLASTY Right 06/10/2017  ? TOTAL KNEE ARTHROPLASTY Right 06/10/2017  ? Procedure: TOTAL KNEE ARTHROPLASTY;  Surgeon: [name], MD;  Location: [facility];  Service: Orthopedics;  Laterality: Right;  ? ? ?SH: ?Social History  ? ?Tobacco Use  ? Smoking status: Never  ? Smokeless tobacco: Never  ?Vaping Use  ? Vaping Use: Never used  ?Substance Use Topics  ? Alcohol use: No  ? Drug use: No   ? ? ?ROS: ?Constitutional:  Negative for fever, chills, weight loss ?CV: Negative for chest pain ?Respiratory:  Negative for shortness of breath, wheezing, sleep apnea, frequent cough ?GI:  Negative for nausea, vomiting, bloody stool, GERD ? ?PE: ?BP (!) 131/55   Pulse 76   Ht '6\' 2"'  (1.88 m)   Wt 174 lb (78.9 kg)   BMI 22.34 kg/m?  ?GENERAL APPEARANCE:  Well appearing, well developed, well nourished, NAD ?HEENT:  Atraumatic, normocephalic ?NECK:  Supple. Trachea midline ?ABDOMEN:  Soft, non-tender, no masses ?EXTREMITIES:  Moves all extremities well, without clubbing, cyanosis, or edema ?NEUROLOGIC:  Alert and oriented x 3, normal gait, CN II-XII grossly intact ?MENTAL STATUS:  appropriate ?BACK:  Non-tender to palpation, No CVAT ?SKIN:  Warm, dry, and intact ? ? ?Results: ?Laboratory Data: ?Lab Results  ?Component Value Date  ? WBC 3.6 (L) 04/29/2022  ? HGB 9.7 (L) 04/29/2022  ? HCT 28.4 (L) 04/29/2022  ? MCV 91.9 04/29/2022  ? PLT 161 04/29/2022  ? ? ?Lab Results  ?Component Value Date  ? CREATININE 1.21 04/29/2022  ? ? ?Lab Results  ?Component Value Date  ? PSA 15.5 (H) 03/20/2020  ? ? ?No results found for: TESTOSTERONE ? ?Lab Results  ?  Component Value Date  ? HGBA1C 6.1 (H) 02/06/2022  ? ? ?Urinalysis ?   ?Component Value Date/Time  ? [place] YELLOW 04/26/2022 0415  ? APPEARANCEUR CLEAR 04/26/2022 0415  ? APPEARANCEUR Clear 04/22/2022 1327  ? LABSPEC 1.009 04/26/2022 0415  ? PHURINE 5.0 04/26/2022 0415  ? [place] NEGATIVE 04/26/2022 0415  ? [place] NEGATIVE 04/26/2022 0415  ? [place] NEGATIVE 04/26/2022 0415  ? BILIRUBINUR Negative 04/22/2022 1327  ? [place] NEGATIVE 04/26/2022 0415  ? [place] NEGATIVE 04/26/2022 0415  ? UROBILINOGEN 0.2 03/27/2020 1138  ? NITRITE NEGATIVE 04/26/2022 0415  ? LEUKOCYTESUR NEGATIVE 04/26/2022 0415  ? ? ?Lab Results  ?Component Value Date  ? LABMICR Comment 04/22/2022  ? [place] None seen 09/24/2021  ? LABEPIT 0-10 09/24/2021  ? MUCUS Present 03/26/2021  ? BACTERIA  None seen 09/24/2021  ? ? ?Pertinent Imaging: ? ?Results for orders placed during the hospital encounter of 05/07/17 ? ?DG Abd 1 View ? ?Narrative ?CLINICAL DATA:  Left flank pain.  microhematuria. ? ?EXAM: ?ABDOMEN - 1 VIEW ? ?CO

## 2022-05-01 NOTE — Telephone Encounter (Signed)
Called [facility] pharmacy on 5/10 to check delivery status of Revlimid.  Rep stated that they had the prescription but had not reached out to the patient to set up delivery.  I called Luis Stewart and added him to the call to set up delivery.  He will receive medication today 05/01/22. ? ?Patient instructed not to start medication until speaking with MD at his next appt. ? ?Dennison Nancy CPHT ?Specialty Pharmacy Patient Advocate ?[place] ?Phone [phone] ?Fax [phone] ?05/01/2022 3:02 PM ? ?

## 2022-05-06 ENCOUNTER — Inpatient Hospital Stay (HOSPITAL_COMMUNITY): Payer: Medicare HMO

## 2022-05-06 ENCOUNTER — Encounter (HOSPITAL_COMMUNITY): Payer: Self-pay

## 2022-05-06 VITALS — Ht 73.62 in | Wt 175.5 lb

## 2022-05-06 DIAGNOSIS — C61 Malignant neoplasm of prostate: Secondary | ICD-10-CM | POA: Diagnosis not present

## 2022-05-06 DIAGNOSIS — Z5112 Encounter for antineoplastic immunotherapy: Secondary | ICD-10-CM | POA: Diagnosis not present

## 2022-05-06 DIAGNOSIS — Z79899 Other long term (current) drug therapy: Secondary | ICD-10-CM | POA: Diagnosis not present

## 2022-05-06 DIAGNOSIS — C9000 Multiple myeloma not having achieved remission: Secondary | ICD-10-CM

## 2022-05-06 DIAGNOSIS — Z5111 Encounter for antineoplastic chemotherapy: Secondary | ICD-10-CM | POA: Diagnosis not present

## 2022-05-06 LAB — CBC WITH DIFFERENTIAL/PLATELET
Abs Immature Granulocytes: 0 10*3/uL (ref 0.00–0.07)
Basophils Absolute: 0 10*3/uL (ref 0.0–0.1)
Basophils Relative: 0 %
Eosinophils Absolute: 0 10*3/uL (ref 0.0–0.5)
Eosinophils Relative: 1 %
HCT: 30 % — ABNORMAL LOW (ref 39.0–52.0)
Hemoglobin: 10.3 g/dL — ABNORMAL LOW (ref 13.0–17.0)
Immature Granulocytes: 0 %
Lymphocytes Relative: 20 %
Lymphs Abs: 0.7 10*3/uL (ref 0.7–4.0)
MCH: 31.4 pg (ref 26.0–34.0)
MCHC: 34.3 g/dL (ref 30.0–36.0)
MCV: 91.5 fL (ref 80.0–100.0)
Monocytes Absolute: 0.4 10*3/uL (ref 0.1–1.0)
Monocytes Relative: 11 %
Neutro Abs: 2.3 10*3/uL (ref 1.7–7.7)
Neutrophils Relative %: 68 %
Platelets: 193 10*3/uL (ref 150–400)
RBC: 3.28 MIL/uL — ABNORMAL LOW (ref 4.22–5.81)
RDW: 17.7 % — ABNORMAL HIGH (ref 11.5–15.5)
WBC: 3.4 10*3/uL — ABNORMAL LOW (ref 4.0–10.5)
nRBC: 0 % (ref 0.0–0.2)

## 2022-05-06 LAB — COMPREHENSIVE METABOLIC PANEL
ALT: 14 U/L (ref 0–44)
AST: 19 U/L (ref 15–41)
Albumin: 3.2 g/dL — ABNORMAL LOW (ref 3.5–5.0)
Alkaline Phosphatase: 106 U/L (ref 38–126)
Anion gap: 2 — ABNORMAL LOW (ref 5–15)
BUN: 17 mg/dL (ref 8–23)
CO2: 27 mmol/L (ref 22–32)
Calcium: 8.2 mg/dL — ABNORMAL LOW (ref 8.9–10.3)
Chloride: 108 mmol/L (ref 98–111)
Creatinine, Ser: 1.19 mg/dL (ref 0.61–1.24)
GFR, Estimated: >60 mL/min (ref >60)
Glucose, Bld: 116 mg/dL — ABNORMAL HIGH (ref 70–99)
Potassium: 3.7 mmol/L (ref 3.5–5.1)
Sodium: 137 mmol/L (ref 135–145)
Total Bilirubin: 0.4 mg/dL (ref 0.3–1.2)
Total Protein: 7.2 g/dL (ref 6.5–8.1)

## 2022-05-06 LAB — PSA: Prostatic Specific Antigen: 26.29 ng/mL — ABNORMAL HIGH (ref 0.00–4.00)

## 2022-05-06 LAB — MAGNESIUM: Magnesium: 2.1 mg/dL (ref 1.7–2.4)

## 2022-05-06 MED ORDER — DEXAMETHASONE 4 MG PO TABS
20.0000 mg | ORAL_TABLET | Freq: Once | ORAL | Status: AC
  Administered 2022-05-06: 20 mg via ORAL
  Filled 2022-05-06: qty 5

## 2022-05-06 MED ORDER — SODIUM CHLORIDE 0.9% FLUSH
10.0000 mL | Freq: Once | INTRAVENOUS | Status: AC
  Administered 2022-05-06: 10 mL via INTRAVENOUS

## 2022-05-06 MED ORDER — DARATUMUMAB-HYALURONIDASE-FIHJ 1800-30000 MG-UT/15ML ~~LOC~~ SOLN
1800.0000 mg | Freq: Once | SUBCUTANEOUS | Status: AC
  Administered 2022-05-06: 1800 mg via SUBCUTANEOUS
  Filled 2022-05-06: qty 15

## 2022-05-06 MED ORDER — DIPHENHYDRAMINE HCL 25 MG PO CAPS
50.0000 mg | ORAL_CAPSULE | Freq: Once | ORAL | Status: AC
  Administered 2022-05-06: 50 mg via ORAL
  Filled 2022-05-06: qty 2

## 2022-05-06 MED ORDER — HEPARIN SOD (PORK) LOCK FLUSH 100 UNIT/ML IV SOLN
500.0000 [IU] | Freq: Once | INTRAVENOUS | Status: AC
  Administered 2022-05-06: 500 [IU] via INTRAVENOUS

## 2022-05-06 MED ORDER — ACETAMINOPHEN 325 MG PO TABS
650.0000 mg | ORAL_TABLET | Freq: Once | ORAL | Status: AC
  Administered 2022-05-06: 650 mg via ORAL
  Filled 2022-05-06: qty 2

## 2022-05-06 NOTE — Progress Notes (Signed)
Patient presents today for Darzalex Faspro injection. Patient has no complaints today. Patient denies any pain due to urinary retention. Patient states urinary retention is better with no complaints of pain. Labs pending. Vital signs within parameters for treatment.  ? ?Labs within parameters for treatment.  ? ?Treatment given today per MD orders. Tolerated without adverse affects. Vital signs stable. No complaints at this time. Discharged from clinic ambulatory in stable condition. Alert and oriented x 3. F/U with [facility] as scheduled.   ?

## 2022-05-06 NOTE — Progress Notes (Signed)
Patients port flushed without difficulty.  Good blood return noted with no bruising or swelling noted at site.  Patient remains accessed for chemotherapy treatment.  

## 2022-05-06 NOTE — Patient Instructions (Signed)
[facility]  Discharge Instructions: ?Thank you for choosing [facility] to provide your oncology and hematology care.  ?If you have a lab appointment with the [facility], please come in thru the Main Entrance and check in at the main information desk. ? ?Wear comfortable clothing and clothing appropriate for easy access to any Portacath or PICC line.  ? ?We strive to give you quality time with your provider. You may need to reschedule your appointment if you arrive late (15 or more minutes).  Arriving late affects you and other patients whose appointments are after yours.  Also, if you miss three or more appointments without notifying the office, you may be dismissed from the clinic at the provider?s discretion.    ?  ?For prescription refill requests, have your pharmacy contact our office and allow 72 hours for refills to be completed.   ? ?Today you received the following chemotherapy and/or immunotherapy agents Darzalex Faspro .     ?  ?To help prevent nausea and vomiting after your treatment, we encourage you to take your nausea medication as directed. ? ?BELOW ARE SYMPTOMS THAT SHOULD BE REPORTED IMMEDIATELY: ?*FEVER GREATER THAN 100.4 F (38 ?C) OR HIGHER ?*CHILLS OR SWEATING ?*NAUSEA AND VOMITING THAT IS NOT CONTROLLED WITH YOUR NAUSEA MEDICATION ?*UNUSUAL SHORTNESS OF BREATH ?*UNUSUAL BRUISING OR BLEEDING ?*URINARY PROBLEMS (pain or burning when urinating, or frequent urination) ?*BOWEL PROBLEMS (unusual diarrhea, constipation, pain near the anus) ?TENDERNESS IN MOUTH AND THROAT WITH OR WITHOUT PRESENCE OF ULCERS (sore throat, sores in mouth, or a toothache) ?UNUSUAL RASH, SWELLING OR PAIN  ?UNUSUAL VAGINAL DISCHARGE OR ITCHING  ? ?Items with * indicate a potential emergency and should be followed up as soon as possible or go to the Emergency Department if any problems should occur. ? ?Please show the CHEMOTHERAPY ALERT CARD or IMMUNOTHERAPY ALERT CARD at check-in to the  Emergency Department and triage nurse. ? ?Should you have questions after your visit or need to cancel or reschedule your appointment, please contact [facility] [phone]  and follow the prompts.  Office hours are 8:00 a.m. to 4:30 p.m. Monday - Friday. Please note that voicemails left after 4:00 p.m. may not be returned until the following business day.  We are closed weekends and major holidays. You have access to a nurse at all times for urgent questions. Please call the main number to the clinic [phone] and follow the prompts. ? ?For any non-urgent questions, you may also contact your provider using MyChart. We now offer e-Visits for anyone 37 and older to request care online for non-urgent symptoms. For details visit [email or web address]. ?  ?Also download the MyChart app! Go to the app store, search "MyChart", open the app, select [facility], and log in with your MyChart username and password. ? ?Due to Covid, a mask is required upon entering the hospital/clinic. If you do not have a mask, one will be given to you upon arrival. For doctor visits, patients may have 1 support person aged 35 or older with them. For treatment visits, patients cannot have anyone with them due to current Covid guidelines and our immunocompromised population.  ?

## 2022-05-07 LAB — KAPPA/LAMBDA LIGHT CHAINS
Kappa free light chain: 9.2 mg/L (ref 3.3–19.4)
Kappa, lambda light chain ratio: 3.41 — ABNORMAL HIGH (ref 0.26–1.65)
Lambda free light chains: 2.7 mg/L — ABNORMAL LOW (ref 5.7–26.3)

## 2022-05-08 DIAGNOSIS — C61 Malignant neoplasm of prostate: Secondary | ICD-10-CM | POA: Diagnosis not present

## 2022-05-08 DIAGNOSIS — Z0100 Encounter for examination of eyes and vision without abnormal findings: Secondary | ICD-10-CM | POA: Diagnosis not present

## 2022-05-08 DIAGNOSIS — H5200 Hypermetropia, unspecified eye: Secondary | ICD-10-CM | POA: Diagnosis not present

## 2022-05-08 DIAGNOSIS — C9000 Multiple myeloma not having achieved remission: Secondary | ICD-10-CM | POA: Diagnosis not present

## 2022-05-09 LAB — PROTEIN ELECTROPHORESIS, SERUM
A/G Ratio: 0.9 (ref 0.7–1.7)
Albumin ELP: 3.2 g/dL (ref 2.9–4.4)
Alpha-1-Globulin: 0.2 g/dL (ref 0.0–0.4)
Alpha-2-Globulin: 0.7 g/dL (ref 0.4–1.0)
Beta Globulin: 0.7 g/dL (ref 0.7–1.3)
Gamma Globulin: 1.8 g/dL (ref 0.4–1.8)
Globulin, Total: 3.4 g/dL (ref 2.2–3.9)
M-Spike, %: 1.6 g/dL — ABNORMAL HIGH
Total Protein ELP: 6.6 g/dL (ref 6.0–8.5)

## 2022-05-13 ENCOUNTER — Inpatient Hospital Stay (HOSPITAL_BASED_OUTPATIENT_CLINIC_OR_DEPARTMENT_OTHER): Payer: Medicare HMO | Admitting: Hematology

## 2022-05-13 ENCOUNTER — Inpatient Hospital Stay (HOSPITAL_COMMUNITY): Payer: Medicare HMO

## 2022-05-13 VITALS — BP 116/65 | HR 67 | Temp 98.3°F | Resp 18

## 2022-05-13 DIAGNOSIS — C61 Malignant neoplasm of prostate: Secondary | ICD-10-CM

## 2022-05-13 DIAGNOSIS — E559 Vitamin D deficiency, unspecified: Secondary | ICD-10-CM | POA: Diagnosis not present

## 2022-05-13 DIAGNOSIS — C9000 Multiple myeloma not having achieved remission: Secondary | ICD-10-CM

## 2022-05-13 DIAGNOSIS — Z5111 Encounter for antineoplastic chemotherapy: Secondary | ICD-10-CM | POA: Diagnosis not present

## 2022-05-13 DIAGNOSIS — Z5112 Encounter for antineoplastic immunotherapy: Secondary | ICD-10-CM | POA: Diagnosis not present

## 2022-05-13 DIAGNOSIS — Z79899 Other long term (current) drug therapy: Secondary | ICD-10-CM | POA: Diagnosis not present

## 2022-05-13 LAB — COMPREHENSIVE METABOLIC PANEL
ALT: 15 U/L (ref 0–44)
AST: 19 U/L (ref 15–41)
Albumin: 3.4 g/dL — ABNORMAL LOW (ref 3.5–5.0)
Alkaline Phosphatase: 108 U/L (ref 38–126)
Anion gap: 5 (ref 5–15)
BUN: 20 mg/dL (ref 8–23)
CO2: 27 mmol/L (ref 22–32)
Calcium: 8.5 mg/dL — ABNORMAL LOW (ref 8.9–10.3)
Chloride: 105 mmol/L (ref 98–111)
Creatinine, Ser: 1.19 mg/dL (ref 0.61–1.24)
GFR, Estimated: >60 mL/min (ref >60)
Glucose, Bld: 112 mg/dL — ABNORMAL HIGH (ref 70–99)
Potassium: 3.9 mmol/L (ref 3.5–5.1)
Sodium: 137 mmol/L (ref 135–145)
Total Bilirubin: 0.6 mg/dL (ref 0.3–1.2)
Total Protein: 6.9 g/dL (ref 6.5–8.1)

## 2022-05-13 LAB — CBC WITH DIFFERENTIAL/PLATELET
Abs Immature Granulocytes: 0.01 10*3/uL (ref 0.00–0.07)
Basophils Absolute: 0 10*3/uL (ref 0.0–0.1)
Basophils Relative: 0 %
Eosinophils Absolute: 0 10*3/uL (ref 0.0–0.5)
Eosinophils Relative: 0 %
HCT: 30.4 % — ABNORMAL LOW (ref 39.0–52.0)
Hemoglobin: 10.4 g/dL — ABNORMAL LOW (ref 13.0–17.0)
Immature Granulocytes: 0 %
Lymphocytes Relative: 9 %
Lymphs Abs: 0.6 10*3/uL — ABNORMAL LOW (ref 0.7–4.0)
MCH: 31 pg (ref 26.0–34.0)
MCHC: 34.2 g/dL (ref 30.0–36.0)
MCV: 90.5 fL (ref 80.0–100.0)
Monocytes Absolute: 0.6 10*3/uL (ref 0.1–1.0)
Monocytes Relative: 9 %
Neutro Abs: 5.6 10*3/uL (ref 1.7–7.7)
Neutrophils Relative %: 82 %
Platelets: 293 10*3/uL (ref 150–400)
RBC: 3.36 MIL/uL — ABNORMAL LOW (ref 4.22–5.81)
RDW: 17.2 % — ABNORMAL HIGH (ref 11.5–15.5)
WBC: 6.8 10*3/uL (ref 4.0–10.5)
nRBC: 0 % (ref 0.0–0.2)

## 2022-05-13 LAB — PSA: Prostatic Specific Antigen: 27.67 ng/mL — ABNORMAL HIGH (ref 0.00–4.00)

## 2022-05-13 LAB — LACTATE DEHYDROGENASE: LDH: 167 U/L (ref 98–192)

## 2022-05-13 LAB — MAGNESIUM: Magnesium: 1.9 mg/dL (ref 1.7–2.4)

## 2022-05-13 MED ORDER — HEPARIN SOD (PORK) LOCK FLUSH 100 UNIT/ML IV SOLN
500.0000 [IU] | Freq: Once | INTRAVENOUS | Status: AC | PRN
  Administered 2022-05-13: 500 [IU]

## 2022-05-13 MED ORDER — DEGARELIX ACETATE 80 MG ~~LOC~~ SOLR
80.0000 mg | Freq: Once | SUBCUTANEOUS | Status: AC
  Administered 2022-05-13: 80 mg via SUBCUTANEOUS
  Filled 2022-05-13: qty 4

## 2022-05-13 MED ORDER — SODIUM CHLORIDE 0.9% FLUSH
10.0000 mL | INTRAVENOUS | Status: DC | PRN
  Administered 2022-05-13: 10 mL

## 2022-05-13 MED ORDER — DEXTROSE 5 % IV SOLN
56.0000 mg/m2 | Freq: Once | INTRAVENOUS | Status: AC
  Administered 2022-05-13: 110 mg via INTRAVENOUS
  Filled 2022-05-13: qty 10

## 2022-05-13 MED ORDER — SODIUM CHLORIDE 0.9 % IV SOLN: Freq: Once | INTRAVENOUS | Status: AC

## 2022-05-13 MED ORDER — DARATUMUMAB-HYALURONIDASE-FIHJ 1800-30000 MG-UT/15ML ~~LOC~~ SOLN
1800.0000 mg | Freq: Once | SUBCUTANEOUS | Status: AC
  Administered 2022-05-13: 1800 mg via SUBCUTANEOUS
  Filled 2022-05-13: qty 15

## 2022-05-13 MED ORDER — ACETAMINOPHEN 325 MG PO TABS
650.0000 mg | ORAL_TABLET | Freq: Once | ORAL | Status: AC
  Administered 2022-05-13: 650 mg via ORAL
  Filled 2022-05-13: qty 2

## 2022-05-13 MED ORDER — SODIUM CHLORIDE 0.9 % IV SOLN
20.0000 mg | Freq: Once | INTRAVENOUS | Status: AC
  Administered 2022-05-13: 20 mg via INTRAVENOUS
  Filled 2022-05-13: qty 2

## 2022-05-13 MED ORDER — DIPHENHYDRAMINE HCL 25 MG PO CAPS
50.0000 mg | ORAL_CAPSULE | Freq: Once | ORAL | Status: AC
  Administered 2022-05-13: 50 mg via ORAL
  Filled 2022-05-13: qty 2

## 2022-05-13 NOTE — Patient Instructions (Signed)
[facility]  Discharge Instructions: Thank you for choosing [facility] to provide your oncology and hematology care.  If you have a lab appointment with the Cancer Center, please come in thru the Main Entrance and check in at the main information desk.  Wear comfortable clothing and clothing appropriate for easy access to any Portacath or PICC line.   We strive to give you quality time with your provider. You may need to reschedule your appointment if you arrive late (15 or more minutes).  Arriving late affects you and other patients whose appointments are after yours.  Also, if you miss three or more appointments without notifying the office, you may be dismissed from the clinic at the provider's discretion.      For prescription refill requests, have your pharmacy contact our office and allow 72 hours for refills to be completed.    Today you received the following chemotherapy and/or immunotherapy agents    To help prevent nausea and vomiting after your treatment, we encourage you to take your nausea medication as directed.  BELOW ARE SYMPTOMS THAT SHOULD BE REPORTED IMMEDIATELY: . *FEVER GREATER THAN 100.4 F (38 C) OR HIGHER . *CHILLS OR SWEATING . *NAUSEA AND VOMITING THAT IS NOT CONTROLLED WITH YOUR NAUSEA MEDICATION . *UNUSUAL SHORTNESS OF BREATH . *UNUSUAL BRUISING OR BLEEDING . *URINARY PROBLEMS (pain or burning when urinating, or frequent urination) . *BOWEL PROBLEMS (unusual diarrhea, constipation, pain near the anus) . TENDERNESS IN MOUTH AND THROAT WITH OR WITHOUT PRESENCE OF ULCERS (sore throat, sores in mouth, or a toothache) . UNUSUAL RASH, SWELLING OR PAIN  . UNUSUAL VAGINAL DISCHARGE OR ITCHING   Items with * indicate a potential emergency and should be followed up as soon as possible or go to the Emergency Department if any problems should occur.  Please show the CHEMOTHERAPY ALERT CARD or IMMUNOTHERAPY ALERT CARD at check-in to the  Emergency Department and triage nurse.  Should you have questions after your visit or need to cancel or reschedule your appointment, please contact [facility] [phone]  and follow the prompts.  Office hours are 8:00 a.m. to 4:30 p.m. Monday - Friday. Please note that voicemails left after 4:00 p.m. may not be returned until the following business day.  We are closed weekends and major holidays. You have access to a nurse at all times for urgent questions. Please call the main number to the clinic [phone] and follow the prompts.  For any non-urgent questions, you may also contact your provider using MyChart. We now offer e-Visits for anyone 18 and older to request care online for non-urgent symptoms. For details visit [email or web address].   Also download the MyChart app! Go to the app store, search "MyChart", open the app, select [facility], and log in with your MyChart username and password.  Due to Covid, a mask is required upon entering the hospital/clinic. If you do not have a mask, one will be given to you upon arrival. For doctor visits, patients may have 1 support person aged 18 or older with them. For treatment visits, patients cannot have anyone with them due to current Covid guidelines and our immunocompromised population.  

## 2022-05-13 NOTE — Patient Instructions (Signed)
[facility] at [facility] Discharge Instructions   You were seen and examined today by Dr. Delton Coombes.  He reviewed the results of your lab work. You myeloma labs have come down significantly, which means treatment is helping.   We will proceed with treatment today.   Return as scheduled.    Thank you for choosing [facility] at [facility] to provide your oncology and hematology care.  To afford each patient quality time with our provider, please arrive at least 15 minutes before your scheduled appointment time.   If you have a lab appointment with the [facility] please come in thru the Main Entrance and check in at the main information desk.  You need to re-schedule your appointment should you arrive 10 or more minutes late.  We strive to give you quality time with our providers, and arriving late affects you and other patients whose appointments are after yours.  Also, if you no show three or more times for appointments you may be dismissed from the clinic at the providers discretion.     Again, thank you for choosing [facility].  Our hope is that these requests will decrease the amount of time that you wait before being seen by our physicians.       _____________________________________________________________  Should you have questions after your visit to [facility], please contact our office at [phone] and follow the prompts.  Our office hours are 8:00 a.m. and 4:30 p.m. Monday - Friday.  Please note that voicemails left after 4:00 p.m. may not be returned until the following business day.  We are closed weekends and major holidays.  You do have access to a nurse 24-7, just call the main number to the clinic [phone] and do not press any options, hold on the line and a nurse will answer the phone.    For prescription refill requests, have your pharmacy contact our office and allow 72 hours.    Due to  Covid, you will need to wear a mask upon entering the hospital. If you do not have a mask, a mask will be given to you at the Main Entrance upon arrival. For doctor visits, patients may have 1 support person age 72 or older with them. For treatment visits, patients can not have anyone with them due to social distancing guidelines and our immunocompromised population.

## 2022-05-13 NOTE — Progress Notes (Signed)
Patient has been examined by Dr. Katragadda, and vital signs and labs have been reviewed. ANC, Creatinine, LFTs, hemoglobin, and platelets are within treatment parameters per M.D. - pt may proceed with treatment.    °

## 2022-05-13 NOTE — Progress Notes (Signed)
[facility] [place], [place] [address]   CLINIC:  Medical Oncology/Hematology  PCP:  Lindell Spar, MD [address] / [place] [address] [phone]   REASON FOR VISIT:  Follow-up for prostate cancer and multiple myeloma  PRIOR THERAPY: none  NGS Results: not done  CURRENT THERAPY: Carfilzomib (20/70) + Daratumumab SQ + Dexamethasone (20/40) DaraKd q28d  BRIEF ONCOLOGIC HISTORY:  Oncology History  Multiple myeloma ([place])  04/09/2022 Initial Diagnosis   Multiple myeloma ([place])    04/15/2022 -  Chemotherapy   Patient is on Treatment Plan : MYELOMA RELAPSED/REFRACTORY Carfilzomib (20/70) + Daratumumab SQ + Dexamethasone (20/40) DaraKd q28d        CANCER STAGING:  Cancer Staging  Multiple myeloma ([place]) Staging form: Plasma Cell Myeloma and Plasma Cell Disorders, AJCC 8th Edition - Clinical: No stage assigned - Unsigned   INTERVAL HISTORY:  Luis Stewart, a 71 y.o. male, returns for routine follow-up and consideration for next cycle of chemotherapy. Luis Stewart was last seen on 04/22/2022.  Due for cycle #2 of DaraKd today.   Overall, he tells me he has been feeling pretty well. He is eating well, and his weight is stable. His tingling/numbness is stable.   Overall, he feels ready for next cycle of chemo today.    REVIEW OF SYSTEMS:  Review of Systems  Constitutional:  Negative for appetite change, fatigue and unexpected weight change.  Respiratory:  Positive for cough.   Endocrine: Positive for hot flashes.  Genitourinary:  Positive for dysuria.   Musculoskeletal:  Positive for arthralgias (legs 3/10) and back pain.  Skin:  Positive for itching.  Neurological:  Positive for headaches and numbness (stable).  Psychiatric/Behavioral:  Positive for sleep disturbance.   All other systems reviewed and are negative.  PAST MEDICAL/SURGICAL HISTORY:  Past Medical History:  Diagnosis Date   Arthritis    Asbestosis  ([place])    Closed fracture of distal end of fibula with tibia with routine healing 05/28/2020   COVID-19 09/03/2021   Cyst of kidney, acquired    Headache    History of fracture of leg    Right, childhood   Hx of migraine headaches    Hyperlipidemia    Neuropathy    PE (pulmonary thromboembolism) ([place])    After knee surgery   Prostate cancer ([place]) 2015   Adenocarcinoma by biopsy   Past Surgical History:  Procedure Laterality Date   BIOPSY PROSTATE  2003 and 2005   COLONOSCOPY  11/28/2008   Dr. Rourk:internal hemorrhoids/tortus colon/ascending colon polyps, tubular adenomas   COLONOSCOPY N/A 09/04/2014   Procedure: COLONOSCOPY;  Surgeon: Daneil Dolin, MD;  [facility];  Service: Endoscopy;  Laterality: N/A;  9:30   COLONOSCOPY N/A 07/17/2017   Procedure: COLONOSCOPY;  Surgeon: Danie Binder, MD;  [facility];  Service: Endoscopy;  Laterality: N/A;   ESOPHAGOGASTRODUODENOSCOPY N/A 07/16/2017   Procedure: ESOPHAGOGASTRODUODENOSCOPY (EGD);  Surgeon: Danie Binder, MD;  [facility];  Service: Endoscopy;  Laterality: N/A;   GIVENS CAPSULE STUDY  07/16/2017   Procedure: GIVENS CAPSULE STUDY;  Surgeon: Danie Binder, MD;  [facility];  Service: Endoscopy;;   JOINT REPLACEMENT N/A    Phreesia 01/12/2021   POLYPECTOMY  07/17/2017   Procedure: POLYPECTOMY;  Surgeon: Danie Binder, MD;  [facility];  Service: Endoscopy;;  cecal   PORTACATH PLACEMENT Left 04/18/2022   Procedure: INSERTION PORT-A-CATH;  Surgeon: Arnoldo Morale,  Elta Guadeloupe, MD;  Location: [facility];  Service: General;  Laterality: Left;   TOTAL KNEE ARTHROPLASTY Right 06/10/2017   TOTAL KNEE ARTHROPLASTY Right 06/10/2017   Procedure: TOTAL KNEE ARTHROPLASTY;  Surgeon: [name], MD;  Location: [facility];  Service: Orthopedics;  Laterality: Right;    SOCIAL HISTORY:  Social History   Socioeconomic History   Marital status: Married    Spouse name: Not on file   Number of  children: Not on file   Years of education: Not on file   Highest education level: Not on file  Occupational History   Occupation: Retired  Tobacco Use   Smoking status: Never   Smokeless tobacco: Never  Vaping Use   Vaping Use: Never used  Substance and Sexual Activity   Alcohol use: No   Drug use: No   Sexual activity: Yes  Other Topics Concern   Not on file  Social History Narrative   Not on file   Social Determinants of Health   Financial Resource Strain: Not on file  Food Insecurity: Not on file  Transportation Needs: No Transportation Needs   Lack of Transportation (Medical): No   Lack of Transportation (Non-Medical): No  Physical Activity: Sufficiently Active   Days of Exercise per Week: 4 days   Minutes of Exercise per Session: 60 min  Stress: Not on file  Social Connections: Moderately Integrated   Frequency of Communication with Friends and Family: More than three times a week   Frequency of Social Gatherings with Friends and Family: More than three times a week   Attends Religious Services: More than 4 times per year   Active Member of [employer] or Organizations: No   Attends Music therapist: Never   Marital Status: Married  Human resources officer Violence: Not At Risk   Fear of Current or Ex-Partner: No   Emotionally Abused: No   Physically Abused: No   Sexually Abused: No    FAMILY HISTORY:  Family History  Problem Relation Age of Onset   Diabetes Mother    Hypertension Mother    Obesity Mother    Heart disease Mother    Mental illness Mother    Hypertension Sister    Obesity Sister    Arthritis Sister    Deep vein thrombosis Father    Dementia Father    Heart disease Father        CABG   Colon cancer Neg Hx     CURRENT MEDICATIONS:  Current Outpatient Medications  Medication Sig Dispense Refill   acetaminophen (TYLENOL) 500 MG tablet Take 1,000 mg by mouth every 6 (six) hours as needed for mild pain or moderate pain.     acyclovir  (ZOVIRAX) 400 MG tablet Take 1 tablet (400 mg total) by mouth 2 (two) times daily. 60 tablet 6   benzonatate (TESSALON) 100 MG capsule Take 1 capsule (100 mg total) by mouth 2 (two) times daily as needed for cough. 20 capsule 0   Carfilzomib (KYPROLIS IV) Inject into the vein once a week.     Cholecalciferol (VITAMIN D) 2000 units CAPS Take 2,000 Units by mouth every other day.     daratumumab-hyaluronidase-fihj (DARZALEX FASPRO) 1800-30000 MG-UT/15ML SOLN Inject 1,800 mg into the skin once a week.     lenalidomide (REVLIMID) 15 MG capsule Take 1 capsule (15 mg total) by mouth daily. Take for 21 days, then hold for 7 days. Repeat every 28 days. 21 capsule 0   lidocaine-prilocaine (EMLA) cream Apply a small  amount to port a cath site and cover with plastic wrap (do not rub in) 1 hour prior to infusion appointments 30 g 0   methocarbamol (ROBAXIN) 500 MG tablet Take 1 tablet (500 mg total) by mouth 3 (three) times daily. (Patient taking differently: Take 500 mg by mouth every 8 (eight) hours as needed for muscle spasms.) 60 tablet 1   Multiple Vitamin (MULTIVITAMIN WITH MINERALS) TABS tablet Take 1 tablet by mouth daily with breakfast. Centrum Silver for Men     nitroGLYCERIN (NITROSTAT) 0.4 MG SL tablet Place 1 tablet (0.4 mg total) under the tongue every 5 (five) minutes as needed for chest pain. 30 tablet 0   pantoprazole (PROTONIX) 40 MG tablet Take 1 tablet (40 mg total) by mouth every other day. 90 tablet 0   prochlorperazine (COMPAZINE) 10 MG tablet Take 1 tablet (10 mg total) by mouth every 6 (six) hours as needed for nausea or vomiting. 30 tablet 3   tamsulosin (FLOMAX) 0.4 MG CAPS capsule Take 1 capsule (0.4 mg total) by mouth daily. 30 capsule 11   traMADol (ULTRAM) 50 MG tablet Take 1 tablet (50 mg total) by mouth every 6 (six) hours as needed. 10 tablet 0   No current facility-administered medications for this visit.    ALLERGIES:  Allergies  Allergen Reactions   Sulfa Antibiotics  Other (See Comments)    Unknown reaction. Childhood reaction    PHYSICAL EXAM:  Performance status (ECOG): 1 - Symptomatic but completely ambulatory  There were no vitals filed for this visit. Wt Readings from Last 3 Encounters:  05/13/22 173 lb 6.4 oz (78.7 kg)  05/06/22 175 lb 7.8 oz (79.6 kg)  04/30/22 174 lb (78.9 kg)   Physical Exam Vitals reviewed.  Constitutional:      Appearance: Normal appearance.  Cardiovascular:     Rate and Rhythm: Normal rate and regular rhythm.     Pulses: Normal pulses.     Heart sounds: Normal heart sounds.  Pulmonary:     Effort: Pulmonary effort is normal.     Breath sounds: Normal breath sounds.  Musculoskeletal:     Right lower leg: No edema.     Left lower leg: No edema.  Neurological:     General: No focal deficit present.     Mental Status: He is alert and oriented to person, place, and time.  Psychiatric:        Mood and Affect: Mood normal.        Behavior: Behavior normal.    LABORATORY DATA:  I have reviewed the labs as listed.     Latest Ref Rng & Units 05/06/2022    9:31 AM 04/29/2022    8:55 AM 04/26/2022    4:34 AM  CBC  WBC 4.0 - 10.5 K/uL 3.4   3.6   2.5    Hemoglobin 13.0 - 17.0 g/dL 10.3   9.7   10.0    Hematocrit 39.0 - 52.0 % 30.0   28.4   30.1    Platelets 150 - 400 K/uL 193   161   88        Latest Ref Rng & Units 05/06/2022    9:31 AM 04/29/2022    8:55 AM 04/26/2022    4:34 AM  CMP  Glucose 70 - 99 mg/dL 116   126   92    BUN 8 - 23 mg/dL _0 Creatinine 0.61 - 1.24 mg/dL 1.19  1.21   1.12    Sodium 135 - 145 mmol/L 137   139   135    Potassium 3.5 - 5.1 mmol/L 3.7   3.5   3.6    Chloride 98 - 111 mmol/L 108   110   108    CO2 22 - 32 mmol/L _0 Calcium 8.9 - 10.3 mg/dL 8.2   8.3   8.3    Total Protein 6.5 - 8.1 g/dL 7.2   7.8   8.2    Total Bilirubin 0.3 - 1.2 mg/dL 0.4   0.5   0.4    Alkaline Phos 38 - 126 U/L 106   83   72    AST 15 - 41 U/L _1 ALT 0 - 44 U/L _2 DIAGNOSTIC IMAGING:  I have independently reviewed the scans and discussed with the patient. CT ABDOMEN PELVIS W CONTRAST  Result Date: 04/26/2022 CLINICAL DATA:  71 year old male with history of urinary retention. Pain in the right hip radiating down the right leg. History of multiple myeloma. EXAM: CT ABDOMEN AND PELVIS WITH CONTRAST CT OF THE LUMBAR SPINE WITHOUT CONTRAST TECHNIQUE: Multidetector CT imaging of the abdomen and pelvis was performed using the standard protocol following bolus administration of intravenous contrast. Dedicated multiplanar reformats of the lumbar spine were also generated for interpretation. RADIATION DOSE REDUCTION: This exam was performed according to the departmental dose-optimization program which includes automated exposure control, adjustment of the mA and/or kV according to patient size and/or use of iterative reconstruction technique. CONTRAST:  168m OMNIPAQUE IOHEXOL 300 MG/ML  SOLN COMPARISON:  PET-CT 04/03/2022. FINDINGS: Lower chest: Unremarkable. Hepatobiliary: Multiple well-defined low-attenuation nonenhancing lesions scattered throughout the hepatic parenchyma, indicative of hepatic cysts, largest of which is in the right lobe of the liver measuring 5.4 x 4.2 cm (axial image 31 of series 3). Multiple subcentimeter low-attenuation lesions in the liver, too small to definitively characterize, but statistically likely tiny cysts or biliary hamartomas. No aggressive appearing hepatic lesions are noted. No intra or extrahepatic biliary ductal dilatation. Gallbladder is normal in appearance. Pancreas: No pancreatic mass. No pancreatic ductal dilatation. No pancreatic or peripancreatic fluid collections or inflammatory changes. Spleen: Unremarkable. Adrenals/Urinary Tract: Multiple low-attenuation lesions in the right kidney, compatible with simple cysts, measuring up to 1.3 cm in the medial aspect of the upper pole. Other subcentimeter low-attenuation  lesions in the right kidney, too small to definitively characterize, but statistically likely cysts (no imaging follow-up is recommended). Left kidney and bilateral adrenal glands are normal in appearance. No hydroureteronephrosis. Urinary bladder wall is thickened, likely accentuated by under distension of the urinary bladder. Stomach/Bowel: The appearance of the stomach is normal. There is no pathologic dilatation of small bowel or colon. Normal appendix. Vascular/Lymphatic: Aortic atherosclerosis, without evidence of aneurysm or dissection in the abdominal or pelvic vasculature. No lymphadenopathy noted in the abdomen or pelvis. Reproductive: Prostate gland is severely enlarged with severe median lobe hypertrophy, overall estimated to measure proximally 7.8 x 8.3 x 9.8 cm. Other: No significant volume of ascites.  No pneumoperitoneum. Musculoskeletal: Multifocal lucency is noted throughout the anatomic pelvis, most evident in the sacrum and left ilium, presumably sequela of treated multiple myeloma, or chronic sequela of a benign process such as Paget's disease. No acute displaced fractures are noted. Alignment in the lumbar spine is anatomic. Mild  multilevel degenerative disc disease and facet arthropathy is noted in the lumbar spine, without acute abnormalities. IMPRESSION: 1. No acute findings are noted in the abdomen or pelvis to account for the patient's symptoms. 2. Severe prostatomegaly with severe median lobe hypertrophy. Urinary bladder wall appears mildly thickened, likely indicative of bladder outlet obstruction. 3. Chronic bony changes in the pelvis which may reflect treated multiple myeloma or chronic process such as Paget's disease. 4. No acute abnormality of the lumbar spine. 5. Aortic atherosclerosis. 6. Additional incidental findings, as above. Electronically Signed   By: Vinnie Langton M.D.   On: 04/26/2022 06:33   CT L-SPINE NO CHARGE  Result Date: 04/26/2022 CLINICAL DATA:  71 year old  male with history of urinary retention. Pain in the right hip radiating down the right leg. History of multiple myeloma. EXAM: CT ABDOMEN AND PELVIS WITH CONTRAST CT OF THE LUMBAR SPINE WITHOUT CONTRAST TECHNIQUE: Multidetector CT imaging of the abdomen and pelvis was performed using the standard protocol following bolus administration of intravenous contrast. Dedicated multiplanar reformats of the lumbar spine were also generated for interpretation. RADIATION DOSE REDUCTION: This exam was performed according to the departmental dose-optimization program which includes automated exposure control, adjustment of the mA and/or kV according to patient size and/or use of iterative reconstruction technique. CONTRAST:  168m OMNIPAQUE IOHEXOL 300 MG/ML  SOLN COMPARISON:  PET-CT 04/03/2022. FINDINGS: Lower chest: Unremarkable. Hepatobiliary: Multiple well-defined low-attenuation nonenhancing lesions scattered throughout the hepatic parenchyma, indicative of hepatic cysts, largest of which is in the right lobe of the liver measuring 5.4 x 4.2 cm (axial image 31 of series 3). Multiple subcentimeter low-attenuation lesions in the liver, too small to definitively characterize, but statistically likely tiny cysts or biliary hamartomas. No aggressive appearing hepatic lesions are noted. No intra or extrahepatic biliary ductal dilatation. Gallbladder is normal in appearance. Pancreas: No pancreatic mass. No pancreatic ductal dilatation. No pancreatic or peripancreatic fluid collections or inflammatory changes. Spleen: Unremarkable. Adrenals/Urinary Tract: Multiple low-attenuation lesions in the right kidney, compatible with simple cysts, measuring up to 1.3 cm in the medial aspect of the upper pole. Other subcentimeter low-attenuation lesions in the right kidney, too small to definitively characterize, but statistically likely cysts (no imaging follow-up is recommended). Left kidney and bilateral adrenal glands are normal in  appearance. No hydroureteronephrosis. Urinary bladder wall is thickened, likely accentuated by under distension of the urinary bladder. Stomach/Bowel: The appearance of the stomach is normal. There is no pathologic dilatation of small bowel or colon. Normal appendix. Vascular/Lymphatic: Aortic atherosclerosis, without evidence of aneurysm or dissection in the abdominal or pelvic vasculature. No lymphadenopathy noted in the abdomen or pelvis. Reproductive: Prostate gland is severely enlarged with severe median lobe hypertrophy, overall estimated to measure proximally 7.8 x 8.3 x 9.8 cm. Other: No significant volume of ascites.  No pneumoperitoneum. Musculoskeletal: Multifocal lucency is noted throughout the anatomic pelvis, most evident in the sacrum and left ilium, presumably sequela of treated multiple myeloma, or chronic sequela of a benign process such as Paget's disease. No acute displaced fractures are noted. Alignment in the lumbar spine is anatomic. Mild multilevel degenerative disc disease and facet arthropathy is noted in the lumbar spine, without acute abnormalities. IMPRESSION: 1. No acute findings are noted in the abdomen or pelvis to account for the patient's symptoms. 2. Severe prostatomegaly with severe median lobe hypertrophy. Urinary bladder wall appears mildly thickened, likely indicative of bladder outlet obstruction. 3. Chronic bony changes in the pelvis which may reflect treated multiple myeloma or chronic process  such as Paget's disease. 4. No acute abnormality of the lumbar spine. 5. Aortic atherosclerosis. 6. Additional incidental findings, as above. Electronically Signed   By: Vinnie Langton M.D.   On: 04/26/2022 06:33   DG Chest Port 1 View  Result Date: 04/18/2022 CLINICAL DATA:  Postop Port-A-Cath EXAM: PORTABLE CHEST 1 VIEW COMPARISON:  Radiograph 12/24/2021 FINDINGS: There is a left chest port with tip overlying the distal superior vena cava. Unchanged cardiomediastinal  silhouette. No focal airspace disease. No large pleural effusion. No visible pneumothorax. There is no acute osseous abnormality. IMPRESSION: Chest port catheter tip overlies the distal superior vena cava. No acute cardiopulmonary disease. Electronically Signed   By: Maurine Simmering M.D.   On: 04/18/2022 09:44   DG C-Arm 1-60 Min-No Report  Result Date: 04/18/2022 Fluoroscopy was utilized by the requesting physician.  No radiographic interpretation.   ECHOCARDIOGRAM COMPLETE  Result Date: 04/24/2022    ECHOCARDIOGRAM REPORT   Patient Name:   Luis Stewart Date of Exam: 04/24/2022 Medical Rec #:  [id number]                   Height:       74.0 in Accession #:    [id number]                  Weight:       173.6 lb Date of Birth:  Sep 04, 1951                   BSA:          2.047 m Patient Age:    23 years                    BP:           138/53 mmHg Patient Gender: M                           HR:           53 bpm. Exam Location:  [facility] Procedure: 2D Echo, Cardiac Doppler and Color Doppler Indications:    Chemo  History:        Patient has prior history of Echocardiogram examinations, most                 recent 10/05/2015. Signs/Symptoms:Chest Pain; Risk                 Factors:Dyslipidemia. Prostate CA.  Sonographer:    Wenda Low Referring Phys: [place]  1. Left ventricular ejection fraction, by estimation, is 60 to 65%. The left ventricle has normal function. The left ventricle has no regional wall motion abnormalities. There is mild left ventricular hypertrophy. Left ventricular diastolic parameters were normal.  2. Right ventricular systolic function is normal. The right ventricular size is normal. There is normal pulmonary artery systolic pressure.  3. The mitral valve is normal in structure. Trivial mitral valve regurgitation. No evidence of mitral stenosis.  4. The aortic valve is tricuspid. There is mild calcification of the aortic valve. There is mild  thickening of the aortic valve. Aortic valve regurgitation is trivial.  5. The inferior vena cava is normal in size with greater than 50% respiratory variability, suggesting right atrial pressure of 3 mmHg. FINDINGS  Left Ventricle: Left ventricular ejection fraction, by estimation, is 60 to 65%. The left ventricle has normal function. The left ventricle has no regional wall motion abnormalities.  The left ventricular internal cavity size was normal in size. There is  mild left ventricular hypertrophy. Left ventricular diastolic parameters were normal. Right Ventricle: The right ventricular size is normal. Right vetricular wall thickness was not well visualized. Right ventricular systolic function is normal. There is normal pulmonary artery systolic pressure. The tricuspid regurgitant velocity is 2.02 m/s, and with an assumed right atrial pressure of 3 mmHg, the estimated right ventricular systolic pressure is 01.7 mmHg. Left Atrium: Left atrial size was normal in size. Right Atrium: Right atrial size was normal in size. Pericardium: There is no evidence of pericardial effusion. Mitral Valve: The mitral valve is normal in structure. Trivial mitral valve regurgitation. No evidence of mitral valve stenosis. MV peak gradient, 3.3 mmHg. The mean mitral valve gradient is 1.0 mmHg. Tricuspid Valve: The tricuspid valve is normal in structure. Tricuspid valve regurgitation is trivial. No evidence of tricuspid stenosis. Aortic Valve: The aortic valve is tricuspid. There is mild calcification of the aortic valve. There is mild thickening of the aortic valve. There is mild aortic valve annular calcification. Aortic valve regurgitation is trivial. Aortic valve mean gradient measures 3.0 mmHg. Aortic valve peak gradient measures 6.4 mmHg. Aortic valve area, by VTI measures 3.19 cm. Pulmonic Valve: The pulmonic valve was not well visualized. Pulmonic valve regurgitation is not visualized. No evidence of pulmonic stenosis. Aorta:  The aortic root is normal in size and structure. Venous: The inferior vena cava is normal in size with greater than 50% respiratory variability, suggesting right atrial pressure of 3 mmHg. IAS/Shunts: No atrial level shunt detected by color flow Doppler.  LEFT VENTRICLE PLAX 2D LVIDd:         4.50 cm     Diastology LVIDs:         3.10 cm     LV e' medial:    6.09 cm/s LV PW:         1.20 cm     LV E/e' medial:  13.5 LV IVS:        1.10 cm     LV e' lateral:   9.25 cm/s LVOT diam:     2.10 cm     LV E/e' lateral: 8.9 LV SV:         87 LV SV Index:   43 LVOT Area:     3.46 cm  LV Volumes (MOD) LV vol d, MOD A2C: 74.9 ml LV vol d, MOD A4C: 68.1 ml LV vol s, MOD A2C: 28.7 ml LV vol s, MOD A4C: 32.2 ml LV SV MOD A2C:     46.2 ml LV SV MOD A4C:     68.1 ml LV SV MOD BP:      43.8 ml RIGHT VENTRICLE RV Basal diam:  2.90 cm RV Mid diam:    2.50 cm RV S prime:     12.70 cm/s TAPSE (M-mode): 2.2 cm LEFT ATRIUM             Index        RIGHT ATRIUM           Index LA diam:        4.40 cm 2.15 cm/m   RA Area:     14.80 cm LA Vol (A2C):   62.2 ml 30.39 ml/m  RA Volume:   33.30 ml  16.27 ml/m LA Vol (A4C):   69.0 ml 33.72 ml/m LA Biplane Vol: 67.4 ml 32.93 ml/m  AORTIC VALVE  PULMONIC VALVE AV Area (Vmax):    3.55 cm     PV Vmax:       0.87 m/s AV Area (Vmean):   3.35 cm     PV Peak grad:  3.0 mmHg AV Area (VTI):     3.19 cm AV Vmax:           126.00 cm/s AV Vmean:          77.800 cm/s AV VTI:            0.274 m AV Peak Grad:      6.4 mmHg AV Mean Grad:      3.0 mmHg LVOT Vmax:         129.00 cm/s LVOT Vmean:        75.300 cm/s LVOT VTI:          0.252 m LVOT/AV VTI ratio: 0.92  AORTA Ao Root diam: 3.30 cm Ao Asc diam:  3.40 cm MITRAL VALVE               TRICUSPID VALVE MV Area (PHT): 2.76 cm    TR Peak grad:   16.3 mmHg MV Area VTI:   2.28 cm    TR Vmax:        202.00 cm/s MV Peak grad:  3.3 mmHg MV Mean grad:  1.0 mmHg    SHUNTS MV Vmax:       0.92 m/s    Systemic VTI:  0.25 m MV Vmean:      50.8  cm/s   Systemic Diam: 2.10 cm MV Decel Time: 275 msec MV E velocity: 82.30 cm/s MV A velocity: 67.30 cm/s MV E/A ratio:  1.22 Carlyle Dolly MD Electronically signed by Carlyle Dolly MD Signature Date/Time: 04/24/2022/4:33:02 PM    Final      ASSESSMENT:  Prostate cancer: - TRUS/Bx on 06/14/2015: 1/12 cores positive for adenocarcinoma-10% of the core in the right lateral base revealed Gleason 3+3=6.  Prostate volume 175 cc.  PSA was 16.6.  PSA density 0.09. - Seen by Dr. Louis Meckel in [facility] for robotic prostatectomy.  Due to low-volume prostate cancer and very low PSA density, it was recommended that he continue active surveillance. - 01/21/2016: Surveillance TRUS/biopsy, following a prostatic MRI which revealed no suspicious prostatic lesions and correlated the patient's large prostate.  All 12 cores were negative for prostate cancer.  Prostate volume was 190 mL. - 08/11/2018: Fusion TRUS/biopsy: Recent prostate MRI-volume 220 mL, no significant lesions.  No evidence of transcapsular/.  Ureteral, SV/bony or lymphatic disease.  Path-1 core (left apex) revealed Gleason 3+3 and 5% of core. - PSA: 17.16 (10/09/2015), 15.4 (11/04/2016), 17.9 (05/15/2017), 18.4 (04/19/2018), 15.9 (02/15/2019), 15.5 (03/20/2020), 22.4 (03/20/2021) - MRI of the lumbar spine with and without contrast on 11/27/2021 done for back pain showed expanded appearance of the sacrum with diffusely abnormal bone marrow signal and contrast-enhancement concerning for metastatic disease. - CT pelvis with contrast on 11/27/2021: Expansile, heterogeneous trabeculated appearance of the sacrum with thickening of the cortex, unchanged from multiple prior exams.  No evidence of pelvic lymphadenopathy.  Extreme prostatomegaly. -PSMA PET scan (03/13/2022): Markedly enlarged prostate gland with focal activity inferior left aspect of the gland suggesting prostatic adenocarcinoma.  No nodal metastasis in the abdomen or pelvis.  Multifocal skeletal metastasis in  the mid sacrum, right sacral ala, subtle lesions in the L2 and several rib lesions. - Degarelix started on 04/15/2022   Social/family history: - Lives at home with his wife.  Retired in 2003  from [employer].  He works part-time work at an [employer].  Non-smoker. - Sister had cholangiocarcinoma.  Paternal first cousin had metastatic cancer.  Paternal uncle had prostate cancer.  3.  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  Core biopsy demonstrates hypercellular marrow (95%) with large atypical plasma cells arranged in sheets, by CD138 IHC plasma cells comprise 70% of the cellular elements, kappa restricted.  Amyloid was negative. 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  Chronic progressive changes involving the sacrum, left iliac bone including accentuation of trabecular markings and cortical thickening with low FDG uptake suggestive of chronic Paget's disease.  Marked prostate gland enlargement. - BMBX (03/20/2022):Hypercellular bone marrow with at least 25% plasma cells on aspirate with atypical features.  Core biopsy demonstrates hypercellular marrow for age (95%) with large atypical plasma cells arranged in sheets.  By CD138 IHC, plasma cells comprise approximately 70% of cellular elements and are kappa restricted.  Amyloid deposition was negative by [place] red.  Karyotype showed 20 q. deletion.  Myeloma FISH panel showed complex abnormalities. 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 - He will receive second dose today.  His PSA is 27.  We will closely monitor.  2.  IgG  kappa multiple myeloma with complex cytogenetics: - He has completed first cycle of Dara KRD. - Reviewed myeloma labs from 05/06/2022.  M spike improved to 1.6 from 4.3.  Light chain ratio improved to 3.41 from above 100. - Reviewed labs today which showed normal CBC.  Creatinine has normalized. - We will start him on Revlimid 15 mg 3 weeks on 1 week off.  Slowly increase the dose based on tolerability. - He met with Dr. Feliciana Rossetti last week.  I have reviewed records. - Proceed with cycle 2 today.  RTC 4 weeks for follow-up.  3.  Normocytic anemia: - Hemoglobin today is 10.4-slowly improving.  4.  Bone health: - We will order a bone density test and vitamin D level prior to next visit.   Orders placed this encounter:  Orders Placed This Encounter  Procedures   Lactate dehydrogenase     [name], MD [place] [phone]   I, Thana Ates, am acting as a scribe for Dr. [name].  I, [name] MD, have reviewed the above documentation for accuracy and completeness, and I agree with the above.

## 2022-05-13 NOTE — Progress Notes (Signed)
Labs reviewed today . Will proceed as planned per md.   Started Revlimid today per MD order.  Treatment given per orders. Patient tolerated it well without problems. Vitals stable and discharged home from clinic ambulatory. Follow up as scheduled.

## 2022-05-20 ENCOUNTER — Encounter (HOSPITAL_COMMUNITY): Payer: Self-pay

## 2022-05-20 ENCOUNTER — Inpatient Hospital Stay (HOSPITAL_COMMUNITY): Payer: Medicare HMO

## 2022-05-20 VITALS — BP 108/62 | HR 58 | Temp 98.0°F | Resp 18 | Wt 176.4 lb

## 2022-05-20 DIAGNOSIS — C61 Malignant neoplasm of prostate: Secondary | ICD-10-CM | POA: Diagnosis not present

## 2022-05-20 DIAGNOSIS — Z5111 Encounter for antineoplastic chemotherapy: Secondary | ICD-10-CM | POA: Diagnosis not present

## 2022-05-20 DIAGNOSIS — C9000 Multiple myeloma not having achieved remission: Secondary | ICD-10-CM

## 2022-05-20 DIAGNOSIS — Z5112 Encounter for antineoplastic immunotherapy: Secondary | ICD-10-CM | POA: Diagnosis not present

## 2022-05-20 DIAGNOSIS — Z79899 Other long term (current) drug therapy: Secondary | ICD-10-CM | POA: Diagnosis not present

## 2022-05-20 LAB — CBC WITH DIFFERENTIAL/PLATELET
Abs Immature Granulocytes: 0.01 10*3/uL (ref 0.00–0.07)
Basophils Absolute: 0 10*3/uL (ref 0.0–0.1)
Basophils Relative: 0 %
Eosinophils Absolute: 0.1 10*3/uL (ref 0.0–0.5)
Eosinophils Relative: 2 %
HCT: 30.7 % — ABNORMAL LOW (ref 39.0–52.0)
Hemoglobin: 10.6 g/dL — ABNORMAL LOW (ref 13.0–17.0)
Immature Granulocytes: 0 %
Lymphocytes Relative: 19 %
Lymphs Abs: 0.7 10*3/uL (ref 0.7–4.0)
MCH: 31.5 pg (ref 26.0–34.0)
MCHC: 34.5 g/dL (ref 30.0–36.0)
MCV: 91.1 fL (ref 80.0–100.0)
Monocytes Absolute: 0.3 10*3/uL (ref 0.1–1.0)
Monocytes Relative: 9 %
Neutro Abs: 2.7 10*3/uL (ref 1.7–7.7)
Neutrophils Relative %: 70 %
Platelets: 153 10*3/uL (ref 150–400)
RBC: 3.37 MIL/uL — ABNORMAL LOW (ref 4.22–5.81)
RDW: 16.6 % — ABNORMAL HIGH (ref 11.5–15.5)
WBC: 3.8 10*3/uL — ABNORMAL LOW (ref 4.0–10.5)
nRBC: 0 % (ref 0.0–0.2)

## 2022-05-20 LAB — COMPREHENSIVE METABOLIC PANEL
ALT: 18 U/L (ref 0–44)
AST: 22 U/L (ref 15–41)
Albumin: 3.2 g/dL — ABNORMAL LOW (ref 3.5–5.0)
Alkaline Phosphatase: 94 U/L (ref 38–126)
Anion gap: 3 — ABNORMAL LOW (ref 5–15)
BUN: 23 mg/dL (ref 8–23)
CO2: 29 mmol/L (ref 22–32)
Calcium: 8.7 mg/dL — ABNORMAL LOW (ref 8.9–10.3)
Chloride: 107 mmol/L (ref 98–111)
Creatinine, Ser: 1.16 mg/dL (ref 0.61–1.24)
GFR, Estimated: >60 mL/min (ref >60)
Glucose, Bld: 85 mg/dL (ref 70–99)
Potassium: 3.7 mmol/L (ref 3.5–5.1)
Sodium: 139 mmol/L (ref 135–145)
Total Bilirubin: 0.4 mg/dL (ref 0.3–1.2)
Total Protein: 6.3 g/dL — ABNORMAL LOW (ref 6.5–8.1)

## 2022-05-20 LAB — MAGNESIUM: Magnesium: 2 mg/dL (ref 1.7–2.4)

## 2022-05-20 MED ORDER — ACETAMINOPHEN 325 MG PO TABS
650.0000 mg | ORAL_TABLET | Freq: Once | ORAL | Status: AC
  Administered 2022-05-20: 650 mg via ORAL
  Filled 2022-05-20: qty 2

## 2022-05-20 MED ORDER — SODIUM CHLORIDE 0.9 % IV SOLN
20.0000 mg | Freq: Once | INTRAVENOUS | Status: AC
  Administered 2022-05-20: 20 mg via INTRAVENOUS
  Filled 2022-05-20: qty 20

## 2022-05-20 MED ORDER — HEPARIN SOD (PORK) LOCK FLUSH 100 UNIT/ML IV SOLN
500.0000 [IU] | Freq: Once | INTRAVENOUS | Status: AC | PRN
  Administered 2022-05-20: 500 [IU]

## 2022-05-20 MED ORDER — DARATUMUMAB-HYALURONIDASE-FIHJ 1800-30000 MG-UT/15ML ~~LOC~~ SOLN
1800.0000 mg | Freq: Once | SUBCUTANEOUS | Status: AC
  Administered 2022-05-20: 1800 mg via SUBCUTANEOUS
  Filled 2022-05-20: qty 15

## 2022-05-20 MED ORDER — SODIUM CHLORIDE 0.9% FLUSH
10.0000 mL | INTRAVENOUS | Status: DC | PRN
  Administered 2022-05-20: 10 mL

## 2022-05-20 MED ORDER — DIPHENHYDRAMINE HCL 25 MG PO CAPS
50.0000 mg | ORAL_CAPSULE | Freq: Once | ORAL | Status: AC
  Administered 2022-05-20: 50 mg via ORAL
  Filled 2022-05-20: qty 2

## 2022-05-20 MED ORDER — SODIUM CHLORIDE 0.9 % IV SOLN: Freq: Once | INTRAVENOUS | Status: AC

## 2022-05-20 MED ORDER — DEXTROSE 5 % IV SOLN
56.0000 mg/m2 | Freq: Once | INTRAVENOUS | Status: AC
  Administered 2022-05-20: 110 mg via INTRAVENOUS
  Filled 2022-05-20: qty 10

## 2022-05-20 NOTE — Patient Instructions (Signed)
[facility]  Discharge Instructions: Thank you for choosing [facility] to provide your oncology and hematology care.  If you have a lab appointment with the [facility], please come in thru the Main Entrance and check in at the main information desk.  Wear comfortable clothing and clothing appropriate for easy access to any Portacath or PICC line.   We strive to give you quality time with your provider. You may need to reschedule your appointment if you arrive late (15 or more minutes).  Arriving late affects you and other patients whose appointments are after yours.  Also, if you miss three or more appointments without notifying the office, you may be dismissed from the clinic at the provider's discretion.      For prescription refill requests, have your pharmacy contact our office and allow 72 hours for refills to be completed.    Today you received the following chemotherapy and/or immunotherapy agents kyprolis and daratumumab.  Daratumumab injection What is this medication? DARATUMUMAB (dar a toom ue mab) is a monoclonal antibody. It is used to treat multiple myeloma. This medicine may be used for other purposes; ask your health care provider or pharmacist if you have questions. COMMON BRAND NAME(S): DARZALEX What should I tell my care team before I take this medication? They need to know if you have any of these conditions: hereditary fructose intolerance infection (especially a virus infection such as chickenpox, herpes, or hepatitis B virus) lung or breathing disease (asthma, COPD) an unusual or allergic reaction to daratumumab, sorbitol, other medicines, foods, dyes, or preservatives pregnant or trying to get pregnant breast-feeding How should I use this medication? This medicine is for infusion into a vein. It is given by a health care professional in a hospital or clinic setting. Talk to your pediatrician regarding the use of this medicine in  children. Special care may be needed. Overdosage: If you think you have taken too much of this medicine contact a poison control center or emergency room at once. NOTE: This medicine is only for you. Do not share this medicine with others. What if I miss a dose? Keep appointments for follow-up doses as directed. It is important not to miss your dose. Call your doctor or health care professional if you are unable to keep an appointment. What may interact with this medication? Interactions have not been studied. This list may not describe all possible interactions. Give your health care provider a list of all the medicines, herbs, non-prescription drugs, or dietary supplements you use. Also tell them if you smoke, drink alcohol, or use illegal drugs. Some items may interact with your medicine. What should I watch for while using this medication? Your condition will be monitored carefully while you are receiving this medicine. This medicine can cause serious allergic reactions. To reduce your risk, your health care provider may give you other medicine to take before receiving this one. Be sure to follow the directions from your health care provider. This medicine can affect the results of blood tests to match your blood type. These changes can last for up to 6 months after the final dose. Your healthcare provider will do blood tests to match your blood type before you start treatment. Tell all of your healthcare providers that you are being treated with this medicine before receiving a blood transfusion. This medicine can affect the results of some tests used to determine treatment response; extra tests may be needed to evaluate response. Do not become  pregnant while taking this medicine or for 3 months after stopping it. Women should inform their health care provider if they wish to become pregnant or think they might be pregnant. There is a potential for serious side effects to an unborn child. Talk to  your health care provider for more information. Do not breast-feed an infant while taking this medicine. What side effects may I notice from receiving this medication? Side effects that you should report to your care team as soon as possible: Allergic reactions--skin rash, itching or hives, swelling of the face, lips, or tongue Blurred vision Infection--fever, chills, cough, sore throat, pain or trouble passing urine Infusion reactions--dizziness, fast heartbeat, feeling faint or lightheaded, falls, headache, increase in blood pressure, nausea, vomiting, or wheezing or trouble breathing with loud or whistling sounds Unusual bleeding or bruising Side effects that usually do not require medical attention (report to your care team if they continue or are bothersome): Constipation Diarrhea Pain, tingling, numbness in the hands or feet Swelling of the ankles, feet, hands Tiredness This list may not describe all possible side effects. Call your doctor for medical advice about side effects. You may report side effects to FDA at 1-800-FDA-1088. Where should I keep my medication? This drug is given in a hospital or clinic and will not be stored at home. NOTE: This sheet is a summary. It may not cover all possible information. If you have questions about this medicine, talk to your doctor, pharmacist, or health care provider.  2023 Elsevier/Gold Standard (2021-05-16 00:00:00) Carfilzomib injection What is this medication? CARFILZOMIB (kar FILZ oh mib) targets a specific protein within cancer cells and stops the cancer cells from growing. It is used to treat multiple myeloma. This medicine may be used for other purposes; ask your health care provider or pharmacist if you have questions. COMMON BRAND NAME(S): KYPROLIS What should I tell my care team before I take this medication? They need to know if you have any of these conditions: heart disease history of blood clots irregular heartbeat kidney  disease liver disease lung or breathing disease an unusual or allergic reaction to carfilzomib, or other medicines, foods, dyes, or preservatives pregnant or trying to get pregnant breast-feeding How should I use this medication? This medicine is for injection or infusion into a vein. It is given by a health care professional in a hospital or clinic setting. Talk to your pediatrician regarding the use of this medicine in children. Special care may be needed. Overdosage: If you think you have taken too much of this medicine contact a poison control center or emergency room at once. NOTE: This medicine is only for you. Do not share this medicine with others. What if I miss a dose? It is important not to miss your dose. Call your doctor or health care professional if you are unable to keep an appointment. What may interact with this medication? Interactions are not expected. This list may not describe all possible interactions. Give your health care provider a list of all the medicines, herbs, non-prescription drugs, or dietary supplements you use. Also tell them if you smoke, drink alcohol, or use illegal drugs. Some items may interact with your medicine. What should I watch for while using this medication? Your condition will be monitored while you are receiving this medicine. You may need blood work done while you are taking this medicine. Do not become pregnant while taking this medicine or for 6 months after stopping it. Women should inform their health care provider  if they wish to become pregnant or think they might be pregnant. Men should not father a child while taking this medicine and for 3 months after stopping it. There is a potential for serious side effects to an unborn child. Talk to your health care provider for more information. Do not breast-feed an infant while taking this medicine or for 2 weeks after stopping it. Check with your health care provider if you have severe diarrhea,  nausea, and vomiting, or if you sweat a lot. The loss of too much body fluid may make it dangerous for you to take this medicine. You may get drowsy or dizzy. Do not drive, use machinery, or do anything that needs mental alertness until you know how this medicine affects you. Do not stand up or sit up quickly, especially if you are an older patient. This reduces the risk of dizzy or fainting spells. What side effects may I notice from receiving this medication? Side effects that you should report to your doctor or health care professional as soon as possible: allergic reactions like skin rash, itching or hives, swelling of the face, lips, or tongue confusion dizziness feeling faint or lightheaded fever or chills palpitations seizures signs and symptoms of bleeding such as bloody or black, tarry stools; red or dark-brown urine; spitting up blood or brown material that looks like coffee grounds; red spots on the skin; unusual bruising or bleeding including from the eye, gums, or nose signs and symptoms of a blood clot such as breathing problems; changes in vision; chest pain; severe, sudden headache; pain, swelling, warmth in the leg; trouble speaking; sudden numbness or weakness of the face, arm or leg signs and symptoms of kidney injury like trouble passing urine or change in the amount of urine signs and symptoms of liver injury like dark yellow or brown urine; general ill feeling or flu-like symptoms; light-colored stools; loss of appetite; nausea; right upper belly pain; unusually weak or tired; yellowing of the eyes or skin Side effects that usually do not require medical attention (report to your doctor or health care professional if they continue or are bothersome): back pain cough diarrhea headache muscle cramps trouble sleeping vomiting This list may not describe all possible side effects. Call your doctor for medical advice about side effects. You may report side effects to FDA at  1-800-FDA-1088. Where should I keep my medication? This drug is given in a hospital or clinic and will not be stored at home. NOTE: This sheet is a summary. It may not cover all possible information. If you have questions about this medicine, talk to your doctor, pharmacist, or health care provider.  2023 Elsevier/Gold Standard (2021-01-17 00:00:00)       To help prevent nausea and vomiting after your treatment, we encourage you to take your nausea medication as directed.  BELOW ARE SYMPTOMS THAT SHOULD BE REPORTED IMMEDIATELY: *FEVER GREATER THAN 100.4 F (38 C) OR HIGHER *CHILLS OR SWEATING *NAUSEA AND VOMITING THAT IS NOT CONTROLLED WITH YOUR NAUSEA MEDICATION *UNUSUAL SHORTNESS OF BREATH *UNUSUAL BRUISING OR BLEEDING *URINARY PROBLEMS (pain or burning when urinating, or frequent urination) *BOWEL PROBLEMS (unusual diarrhea, constipation, pain near the anus) TENDERNESS IN MOUTH AND THROAT WITH OR WITHOUT PRESENCE OF ULCERS (sore throat, sores in mouth, or a toothache) UNUSUAL RASH, SWELLING OR PAIN  UNUSUAL VAGINAL DISCHARGE OR ITCHING   Items with * indicate a potential emergency and should be followed up as soon as possible or go to the Emergency Department if any  problems should occur.  Please show the CHEMOTHERAPY ALERT CARD or IMMUNOTHERAPY ALERT CARD at check-in to the Emergency Department and triage nurse.  Should you have questions after your visit or need to cancel or reschedule your appointment, please contact [facility] [phone]  and follow the prompts.  Office hours are 8:00 a.m. to 4:30 p.m. Monday - Friday. Please note that voicemails left after 4:00 p.m. may not be returned until the following business day.  We are closed weekends and major holidays. You have access to a nurse at all times for urgent questions. Please call the main number to the clinic [phone] and follow the prompts.  For any non-urgent questions, you may also contact your provider  using MyChart. We now offer e-Visits for anyone 30 and older to request care online for non-urgent symptoms. For details visit [email or web address].   Also download the MyChart app! Go to the app store, search "MyChart", open the app, select [facility], and log in with your MyChart username and password.  Due to Covid, a mask is required upon entering the hospital/clinic. If you do not have a mask, one will be given to you upon arrival. For doctor visits, patients may have 1 support person aged 22 or older with them. For treatment visits, patients cannot have anyone with them due to current Covid guidelines and our immunocompromised population.

## 2022-05-20 NOTE — Progress Notes (Signed)
Patient tolerated chemotherapy with no complaints voiced.  Side effects with management reviewed with understanding verbalized.  Port site clean and dry with no bruising or swelling noted at site.  Good blood return noted before and after administration of chemotherapy.  Band aid applied.  Patient left in satisfactory condition with VSS and no s/s of distress noted.    Patient tolerated Daratumumab injection with no complaints voiced.  See MAR for details.  Labs reviewed. Injection site clean and dry with no bruising or swelling noted at site.  Band aid applied.  Vss with discharge and left in satisfactory condition with no s/s of distress noted.

## 2022-05-21 ENCOUNTER — Other Ambulatory Visit (HOSPITAL_COMMUNITY): Payer: Self-pay

## 2022-05-21 DIAGNOSIS — C9000 Multiple myeloma not having achieved remission: Secondary | ICD-10-CM

## 2022-05-21 LAB — KAPPA/LAMBDA LIGHT CHAINS
Kappa free light chain: 8.3 mg/L (ref 3.3–19.4)
Kappa, lambda light chain ratio: 3.32 — ABNORMAL HIGH (ref 0.26–1.65)
Lambda free light chains: 2.5 mg/L — ABNORMAL LOW (ref 5.7–26.3)

## 2022-05-21 LAB — PROTEIN ELECTROPHORESIS, SERUM
A/G Ratio: 1.1 (ref 0.7–1.7)
Albumin ELP: 3.4 g/dL (ref 2.9–4.4)
Alpha-1-Globulin: 0.3 g/dL (ref 0.0–0.4)
Alpha-2-Globulin: 0.6 g/dL (ref 0.4–1.0)
Beta Globulin: 0.9 g/dL (ref 0.7–1.3)
Gamma Globulin: 1.4 g/dL (ref 0.4–1.8)
Globulin, Total: 3.2 g/dL (ref 2.2–3.9)
M-Spike, %: 1 g/dL — ABNORMAL HIGH
Total Protein ELP: 6.6 g/dL (ref 6.0–8.5)

## 2022-05-21 MED ORDER — LENALIDOMIDE 15 MG PO CAPS: 15.0000 mg | ORAL_CAPSULE | Freq: Every day | ORAL | 0 refills | Status: DC

## 2022-05-21 NOTE — Telephone Encounter (Signed)
Chart reviewed. Revlimid refilled per last office note with Dr. Katragadda.  

## 2022-05-26 MED FILL — Dexamethasone Sodium Phosphate Inj 100 MG/10ML: INTRAMUSCULAR | Qty: 2 | Status: AC

## 2022-05-27 ENCOUNTER — Inpatient Hospital Stay (HOSPITAL_COMMUNITY): Payer: Medicare HMO | Attending: Hematology

## 2022-05-27 ENCOUNTER — Encounter (HOSPITAL_COMMUNITY): Payer: Self-pay

## 2022-05-27 ENCOUNTER — Inpatient Hospital Stay (HOSPITAL_COMMUNITY): Payer: Medicare HMO

## 2022-05-27 VITALS — BP 122/71 | HR 63 | Temp 97.6°F | Resp 18 | Ht 73.62 in | Wt 176.0 lb

## 2022-05-27 DIAGNOSIS — Z5111 Encounter for antineoplastic chemotherapy: Secondary | ICD-10-CM | POA: Diagnosis not present

## 2022-05-27 DIAGNOSIS — C61 Malignant neoplasm of prostate: Secondary | ICD-10-CM | POA: Insufficient documentation

## 2022-05-27 DIAGNOSIS — C9000 Multiple myeloma not having achieved remission: Secondary | ICD-10-CM | POA: Diagnosis present

## 2022-05-27 DIAGNOSIS — Z79818 Long term (current) use of other agents affecting estrogen receptors and estrogen levels: Secondary | ICD-10-CM | POA: Diagnosis not present

## 2022-05-27 DIAGNOSIS — Z79899 Other long term (current) drug therapy: Secondary | ICD-10-CM | POA: Diagnosis not present

## 2022-05-27 DIAGNOSIS — Z5112 Encounter for antineoplastic immunotherapy: Secondary | ICD-10-CM | POA: Diagnosis present

## 2022-05-27 LAB — COMPREHENSIVE METABOLIC PANEL
ALT: 23 U/L (ref 0–44)
AST: 24 U/L (ref 15–41)
Albumin: 3.3 g/dL — ABNORMAL LOW (ref 3.5–5.0)
Alkaline Phosphatase: 86 U/L (ref 38–126)
Anion gap: 8 (ref 5–15)
BUN: 22 mg/dL (ref 8–23)
CO2: 26 mmol/L (ref 22–32)
Calcium: 8.8 mg/dL — ABNORMAL LOW (ref 8.9–10.3)
Chloride: 107 mmol/L (ref 98–111)
Creatinine, Ser: 1.13 mg/dL (ref 0.61–1.24)
GFR, Estimated: >60 mL/min (ref >60)
Glucose, Bld: 93 mg/dL (ref 70–99)
Potassium: 3.9 mmol/L (ref 3.5–5.1)
Sodium: 141 mmol/L (ref 135–145)
Total Bilirubin: 0.6 mg/dL (ref 0.3–1.2)
Total Protein: 6.1 g/dL — ABNORMAL LOW (ref 6.5–8.1)

## 2022-05-27 LAB — CBC WITH DIFFERENTIAL/PLATELET
Abs Immature Granulocytes: 0.01 10*3/uL (ref 0.00–0.07)
Basophils Absolute: 0 10*3/uL (ref 0.0–0.1)
Basophils Relative: 0 %
Eosinophils Absolute: 0.2 10*3/uL (ref 0.0–0.5)
Eosinophils Relative: 5 %
HCT: 30.2 % — ABNORMAL LOW (ref 39.0–52.0)
Hemoglobin: 10.3 g/dL — ABNORMAL LOW (ref 13.0–17.0)
Immature Granulocytes: 0 %
Lymphocytes Relative: 19 %
Lymphs Abs: 0.7 10*3/uL (ref 0.7–4.0)
MCH: 31.5 pg (ref 26.0–34.0)
MCHC: 34.1 g/dL (ref 30.0–36.0)
MCV: 92.4 fL (ref 80.0–100.0)
Monocytes Absolute: 0.5 10*3/uL (ref 0.1–1.0)
Monocytes Relative: 13 %
Neutro Abs: 2.5 10*3/uL (ref 1.7–7.7)
Neutrophils Relative %: 63 %
Platelets: 166 10*3/uL (ref 150–400)
RBC: 3.27 MIL/uL — ABNORMAL LOW (ref 4.22–5.81)
RDW: 16.9 % — ABNORMAL HIGH (ref 11.5–15.5)
WBC: 3.9 10*3/uL — ABNORMAL LOW (ref 4.0–10.5)
nRBC: 0 % (ref 0.0–0.2)

## 2022-05-27 LAB — MAGNESIUM: Magnesium: 2.1 mg/dL (ref 1.7–2.4)

## 2022-05-27 MED ORDER — DARATUMUMAB-HYALURONIDASE-FIHJ 1800-30000 MG-UT/15ML ~~LOC~~ SOLN
1800.0000 mg | Freq: Once | SUBCUTANEOUS | Status: AC
  Administered 2022-05-27: 1800 mg via SUBCUTANEOUS
  Filled 2022-05-27: qty 15

## 2022-05-27 MED ORDER — SODIUM CHLORIDE 0.9 % IV SOLN: Freq: Once | INTRAVENOUS | Status: AC

## 2022-05-27 MED ORDER — SODIUM CHLORIDE 0.9 % IV SOLN
20.0000 mg | Freq: Once | INTRAVENOUS | Status: AC
  Administered 2022-05-27: 20 mg via INTRAVENOUS
  Filled 2022-05-27: qty 20

## 2022-05-27 MED ORDER — DEXTROSE 5 % IV SOLN
56.0000 mg/m2 | Freq: Once | INTRAVENOUS | Status: AC
  Administered 2022-05-27: 110 mg via INTRAVENOUS
  Filled 2022-05-27: qty 10

## 2022-05-27 MED ORDER — SODIUM CHLORIDE 0.9% FLUSH
10.0000 mL | INTRAVENOUS | Status: DC | PRN
  Administered 2022-05-27: 10 mL

## 2022-05-27 MED ORDER — HEPARIN SOD (PORK) LOCK FLUSH 100 UNIT/ML IV SOLN
500.0000 [IU] | Freq: Once | INTRAVENOUS | Status: AC | PRN
  Administered 2022-05-27: 500 [IU]

## 2022-05-27 MED ORDER — DIPHENHYDRAMINE HCL 25 MG PO CAPS
50.0000 mg | ORAL_CAPSULE | Freq: Once | ORAL | Status: AC
  Administered 2022-05-27: 50 mg via ORAL
  Filled 2022-05-27: qty 2

## 2022-05-27 MED ORDER — ACETAMINOPHEN 325 MG PO TABS
650.0000 mg | ORAL_TABLET | Freq: Once | ORAL | Status: AC
  Administered 2022-05-27: 650 mg via ORAL
  Filled 2022-05-27: qty 2

## 2022-05-27 NOTE — Progress Notes (Signed)
Patient presents today for treatment, patient reports having cramping in hands and legs and a skin tear on right lower back. Dr. Delton Coombes made aware, patient is okay for treatment. Patient tolerated chemotherapy with no complaints voiced. Side effects with management reviewed understanding verbalized. Port site clean and dry with no bruising or swelling noted at site. Good blood return noted before and after administration of chemotherapy. Band aid applied.  Patient tolerated Daratumumab injection with no complaints voiced. See MAR for details. Lab reviewed. Injection site clean and dry with no bruising or swelling noted at site. Band aid applied. Vss with discharge and left in satisfactory condition with nos/s of distress noted.

## 2022-05-27 NOTE — Patient Instructions (Signed)
[facility]  Discharge Instructions: Thank you for choosing [facility] to provide your oncology and hematology care.  If you have a lab appointment with the [facility], please come in thru the Main Entrance and check in at the main information desk.  Wear comfortable clothing and clothing appropriate for easy access to any Portacath or PICC line.   We strive to give you quality time with your provider. You may need to reschedule your appointment if you arrive late (15 or more minutes).  Arriving late affects you and other patients whose appointments are after yours.  Also, if you miss three or more appointments without notifying the office, you may be dismissed from the clinic at the provider's discretion.      For prescription refill requests, have your pharmacy contact our office and allow 72 hours for refills to be completed.    Today you received the following chemotherapy and/or immunotherapy agents Kyprolis and Daratumumab, return as scheduled.   To help prevent nausea and vomiting after your treatment, we encourage you to take your nausea medication as directed.  BELOW ARE SYMPTOMS THAT SHOULD BE REPORTED IMMEDIATELY: *FEVER GREATER THAN 100.4 F (38 C) OR HIGHER *CHILLS OR SWEATING *NAUSEA AND VOMITING THAT IS NOT CONTROLLED WITH YOUR NAUSEA MEDICATION *UNUSUAL SHORTNESS OF BREATH *UNUSUAL BRUISING OR BLEEDING *URINARY PROBLEMS (pain or burning when urinating, or frequent urination) *BOWEL PROBLEMS (unusual diarrhea, constipation, pain near the anus) TENDERNESS IN MOUTH AND THROAT WITH OR WITHOUT PRESENCE OF ULCERS (sore throat, sores in mouth, or a toothache) UNUSUAL RASH, SWELLING OR PAIN  UNUSUAL VAGINAL DISCHARGE OR ITCHING   Items with * indicate a potential emergency and should be followed up as soon as possible or go to the Emergency Department if any problems should occur.  Please show the CHEMOTHERAPY ALERT CARD or IMMUNOTHERAPY ALERT CARD  at check-in to the Emergency Department and triage nurse.  Should you have questions after your visit or need to cancel or reschedule your appointment, please contact [facility] [phone]  and follow the prompts.  Office hours are 8:00 a.m. to 4:30 p.m. Monday - Friday. Please note that voicemails left after 4:00 p.m. may not be returned until the following business day.  We are closed weekends and major holidays. You have access to a nurse at all times for urgent questions. Please call the main number to the clinic [phone] and follow the prompts.  For any non-urgent questions, you may also contact your provider using MyChart. We now offer e-Visits for anyone 67 and older to request care online for non-urgent symptoms. For details visit [email or web address].   Also download the MyChart app! Go to the app store, search "MyChart", open the app, select [facility], and log in with your MyChart username and password.  Due to Covid, a mask is required upon entering the hospital/clinic. If you do not have a mask, one will be given to you upon arrival. For doctor visits, patients may have 1 support person aged 70 or older with them. For treatment visits, patients cannot have anyone with them due to current Covid guidelines and our immunocompromised population.

## 2022-05-27 NOTE — Progress Notes (Signed)
Patients port flushed without difficulty.  Good blood return noted with no bruising or swelling noted at site.  Stable during access and blood draw.  Patient to remain accessed for treatment. 

## 2022-06-03 ENCOUNTER — Inpatient Hospital Stay (HOSPITAL_COMMUNITY): Payer: Medicare HMO

## 2022-06-03 DIAGNOSIS — C61 Malignant neoplasm of prostate: Secondary | ICD-10-CM

## 2022-06-03 DIAGNOSIS — Z79818 Long term (current) use of other agents affecting estrogen receptors and estrogen levels: Secondary | ICD-10-CM | POA: Diagnosis not present

## 2022-06-03 DIAGNOSIS — C9000 Multiple myeloma not having achieved remission: Secondary | ICD-10-CM | POA: Diagnosis not present

## 2022-06-03 DIAGNOSIS — Z5112 Encounter for antineoplastic immunotherapy: Secondary | ICD-10-CM | POA: Diagnosis not present

## 2022-06-03 DIAGNOSIS — E559 Vitamin D deficiency, unspecified: Secondary | ICD-10-CM

## 2022-06-03 DIAGNOSIS — Z5111 Encounter for antineoplastic chemotherapy: Secondary | ICD-10-CM | POA: Diagnosis not present

## 2022-06-03 DIAGNOSIS — Z79899 Other long term (current) drug therapy: Secondary | ICD-10-CM | POA: Diagnosis not present

## 2022-06-03 LAB — CBC WITH DIFFERENTIAL/PLATELET
Abs Immature Granulocytes: 0.01 10*3/uL (ref 0.00–0.07)
Basophils Absolute: 0 10*3/uL (ref 0.0–0.1)
Basophils Relative: 0 %
Eosinophils Absolute: 0.3 10*3/uL (ref 0.0–0.5)
Eosinophils Relative: 8 %
HCT: 29.6 % — ABNORMAL LOW (ref 39.0–52.0)
Hemoglobin: 10 g/dL — ABNORMAL LOW (ref 13.0–17.0)
Immature Granulocytes: 0 %
Lymphocytes Relative: 13 %
Lymphs Abs: 0.5 10*3/uL — ABNORMAL LOW (ref 0.7–4.0)
MCH: 31.5 pg (ref 26.0–34.0)
MCHC: 33.8 g/dL (ref 30.0–36.0)
MCV: 93.4 fL (ref 80.0–100.0)
Monocytes Absolute: 0.4 10*3/uL (ref 0.1–1.0)
Monocytes Relative: 11 %
Neutro Abs: 2.5 10*3/uL (ref 1.7–7.7)
Neutrophils Relative %: 68 %
Platelets: 191 10*3/uL (ref 150–400)
RBC: 3.17 MIL/uL — ABNORMAL LOW (ref 4.22–5.81)
RDW: 17 % — ABNORMAL HIGH (ref 11.5–15.5)
WBC: 3.7 10*3/uL — ABNORMAL LOW (ref 4.0–10.5)
nRBC: 0 % (ref 0.0–0.2)

## 2022-06-03 LAB — COMPREHENSIVE METABOLIC PANEL
ALT: 25 U/L (ref 0–44)
AST: 27 U/L (ref 15–41)
Albumin: 3.2 g/dL — ABNORMAL LOW (ref 3.5–5.0)
Alkaline Phosphatase: 82 U/L (ref 38–126)
Anion gap: 4 — ABNORMAL LOW (ref 5–15)
BUN: 18 mg/dL (ref 8–23)
CO2: 28 mmol/L (ref 22–32)
Calcium: 8.2 mg/dL — ABNORMAL LOW (ref 8.9–10.3)
Chloride: 107 mmol/L (ref 98–111)
Creatinine, Ser: 1.1 mg/dL (ref 0.61–1.24)
GFR, Estimated: >60 mL/min (ref >60)
Glucose, Bld: 95 mg/dL (ref 70–99)
Potassium: 3.5 mmol/L (ref 3.5–5.1)
Sodium: 139 mmol/L (ref 135–145)
Total Bilirubin: 0.8 mg/dL (ref 0.3–1.2)
Total Protein: 5.6 g/dL — ABNORMAL LOW (ref 6.5–8.1)

## 2022-06-03 LAB — LACTATE DEHYDROGENASE: LDH: 146 U/L (ref 98–192)

## 2022-06-03 LAB — PSA: Prostatic Specific Antigen: 11.99 ng/mL — ABNORMAL HIGH (ref 0.00–4.00)

## 2022-06-03 LAB — VITAMIN D 25 HYDROXY (VIT D DEFICIENCY, FRACTURES): Vit D, 25-Hydroxy: 41.17 ng/mL (ref 30–100)

## 2022-06-03 LAB — MAGNESIUM: Magnesium: 1.9 mg/dL (ref 1.7–2.4)

## 2022-06-03 MED ORDER — SODIUM CHLORIDE 0.9% FLUSH
10.0000 mL | Freq: Once | INTRAVENOUS | Status: AC
  Administered 2022-06-03: 10 mL via INTRAVENOUS

## 2022-06-03 MED ORDER — DARATUMUMAB-HYALURONIDASE-FIHJ 1800-30000 MG-UT/15ML ~~LOC~~ SOLN
1800.0000 mg | Freq: Once | SUBCUTANEOUS | Status: AC
  Administered 2022-06-03: 1800 mg via SUBCUTANEOUS
  Filled 2022-06-03: qty 15

## 2022-06-03 MED ORDER — DEXAMETHASONE 4 MG PO TABS
20.0000 mg | ORAL_TABLET | Freq: Once | ORAL | Status: AC
  Administered 2022-06-03: 20 mg via ORAL
  Filled 2022-06-03: qty 5

## 2022-06-03 MED ORDER — HEPARIN SOD (PORK) LOCK FLUSH 100 UNIT/ML IV SOLN
500.0000 [IU] | Freq: Once | INTRAVENOUS | Status: AC
  Administered 2022-06-03: 500 [IU] via INTRAVENOUS

## 2022-06-03 MED ORDER — ACETAMINOPHEN 325 MG PO TABS
650.0000 mg | ORAL_TABLET | Freq: Once | ORAL | Status: AC
  Administered 2022-06-03: 650 mg via ORAL
  Filled 2022-06-03: qty 2

## 2022-06-03 MED ORDER — DIPHENHYDRAMINE HCL 25 MG PO CAPS
50.0000 mg | ORAL_CAPSULE | Freq: Once | ORAL | Status: AC
  Administered 2022-06-03: 50 mg via ORAL
  Filled 2022-06-03: qty 2

## 2022-06-03 NOTE — Progress Notes (Signed)
Patient tolerated Daratumumab injection with no complaints voiced.  See MAR for details.  Labs reviewed. Injection site clean and dry with no bruising or swelling noted at site.  Band aid applied.  Vss with discharge and left in satisfactory condition with no s/s of distress noted.   

## 2022-06-03 NOTE — Patient Instructions (Signed)
[facility]  Discharge Instructions: Thank you for choosing [facility] to provide your oncology and hematology care.  If you have a lab appointment with the [facility], please come in thru the Main Entrance and check in at the main information desk.  Wear comfortable clothing and clothing appropriate for easy access to any Portacath or PICC line.   We strive to give you quality time with your provider. You may need to reschedule your appointment if you arrive late (15 or more minutes).  Arriving late affects you and other patients whose appointments are after yours.  Also, if you miss three or more appointments without notifying the office, you may be dismissed from the clinic at the provider's discretion.      For prescription refill requests, have your pharmacy contact our office and allow 72 hours for refills to be completed.    Today you received the following chemotherapy and/or immunotherapy agents darzalex.  Daratumumab injection What is this medication? DARATUMUMAB (dar a toom ue mab) is a monoclonal antibody. It is used to treat multiple myeloma. This medicine may be used for other purposes; ask your health care provider or pharmacist if you have questions. COMMON BRAND NAME(S): DARZALEX What should I tell my care team before I take this medication? They need to know if you have any of these conditions: hereditary fructose intolerance infection (especially a virus infection such as chickenpox, herpes, or hepatitis B virus) lung or breathing disease (asthma, COPD) an unusual or allergic reaction to daratumumab, sorbitol, other medicines, foods, dyes, or preservatives pregnant or trying to get pregnant breast-feeding How should I use this medication? This medicine is for infusion into a vein. It is given by a health care professional in a hospital or clinic setting. Talk to your pediatrician regarding the use of this medicine in children. Special care  may be needed. Overdosage: If you think you have taken too much of this medicine contact a poison control center or emergency room at once. NOTE: This medicine is only for you. Do not share this medicine with others. What if I miss a dose? Keep appointments for follow-up doses as directed. It is important not to miss your dose. Call your doctor or health care professional if you are unable to keep an appointment. What may interact with this medication? Interactions have not been studied. This list may not describe all possible interactions. Give your health care provider a list of all the medicines, herbs, non-prescription drugs, or dietary supplements you use. Also tell them if you smoke, drink alcohol, or use illegal drugs. Some items may interact with your medicine. What should I watch for while using this medication? Your condition will be monitored carefully while you are receiving this medicine. This medicine can cause serious allergic reactions. To reduce your risk, your health care provider may give you other medicine to take before receiving this one. Be sure to follow the directions from your health care provider. This medicine can affect the results of blood tests to match your blood type. These changes can last for up to 6 months after the final dose. Your healthcare provider will do blood tests to match your blood type before you start treatment. Tell all of your healthcare providers that you are being treated with this medicine before receiving a blood transfusion. This medicine can affect the results of some tests used to determine treatment response; extra tests may be needed to evaluate response. Do not become pregnant while  taking this medicine or for 3 months after stopping it. Women should inform their health care provider if they wish to become pregnant or think they might be pregnant. There is a potential for serious side effects to an unborn child. Talk to your health care  provider for more information. Do not breast-feed an infant while taking this medicine. What side effects may I notice from receiving this medication? Side effects that you should report to your care team as soon as possible: Allergic reactions--skin rash, itching or hives, swelling of the face, lips, or tongue Blurred vision Infection--fever, chills, cough, sore throat, pain or trouble passing urine Infusion reactions--dizziness, fast heartbeat, feeling faint or lightheaded, falls, headache, increase in blood pressure, nausea, vomiting, or wheezing or trouble breathing with loud or whistling sounds Unusual bleeding or bruising Side effects that usually do not require medical attention (report to your care team if they continue or are bothersome): Constipation Diarrhea Pain, tingling, numbness in the hands or feet Swelling of the ankles, feet, hands Tiredness This list may not describe all possible side effects. Call your doctor for medical advice about side effects. You may report side effects to FDA at 1-800-FDA-1088. Where should I keep my medication? This drug is given in a hospital or clinic and will not be stored at home. NOTE: This sheet is a summary. It may not cover all possible information. If you have questions about this medicine, talk to your doctor, pharmacist, or health care provider.  2023 Elsevier/Gold Standard (2021-05-16 00:00:00)       To help prevent nausea and vomiting after your treatment, we encourage you to take your nausea medication as directed.  BELOW ARE SYMPTOMS THAT SHOULD BE REPORTED IMMEDIATELY: *FEVER GREATER THAN 100.4 F (38 C) OR HIGHER *CHILLS OR SWEATING *NAUSEA AND VOMITING THAT IS NOT CONTROLLED WITH YOUR NAUSEA MEDICATION *UNUSUAL SHORTNESS OF BREATH *UNUSUAL BRUISING OR BLEEDING *URINARY PROBLEMS (pain or burning when urinating, or frequent urination) *BOWEL PROBLEMS (unusual diarrhea, constipation, pain near the anus) TENDERNESS IN MOUTH AND  THROAT WITH OR WITHOUT PRESENCE OF ULCERS (sore throat, sores in mouth, or a toothache) UNUSUAL RASH, SWELLING OR PAIN  UNUSUAL VAGINAL DISCHARGE OR ITCHING   Items with * indicate a potential emergency and should be followed up as soon as possible or go to the Emergency Department if any problems should occur.  Please show the CHEMOTHERAPY ALERT CARD or IMMUNOTHERAPY ALERT CARD at check-in to the Emergency Department and triage nurse.  Should you have questions after your visit or need to cancel or reschedule your appointment, please contact [facility] [phone]  and follow the prompts.  Office hours are 8:00 a.m. to 4:30 p.m. Monday - Friday. Please note that voicemails left after 4:00 p.m. may not be returned until the following business day.  We are closed weekends and major holidays. You have access to a nurse at all times for urgent questions. Please call the main number to the clinic [phone] and follow the prompts.  For any non-urgent questions, you may also contact your provider using MyChart. We now offer e-Visits for anyone 26 and older to request care online for non-urgent symptoms. For details visit [email or web address].   Also download the MyChart app! Go to the app store, search "MyChart", open the app, select [facility], and log in with your MyChart username and password.  Masks are optional in the cancer centers. If you would like for your care team to wear a mask while they are taking  care of you, please let them know. For doctor visits, patients may have with them one support person who is at least 72 years old. At this time, visitors are not allowed in the infusion area.

## 2022-06-03 NOTE — Progress Notes (Signed)
Patients port flushed without difficulty.  Good blood return noted with no bruising or swelling noted at site.  Stable during access and blood draw.  Patient to stay accessed for treatment.

## 2022-06-04 ENCOUNTER — Ambulatory Visit (HOSPITAL_COMMUNITY)
Admission: RE | Admit: 2022-06-04 | Discharge: 2022-06-04 | Disposition: A | Discharge status: Home / Self Care | Payer: Medicare HMO | Source: Ambulatory Visit | Attending: Hematology | Admitting: Hematology

## 2022-06-04 DIAGNOSIS — M85851 Other specified disorders of bone density and structure, right thigh: Secondary | ICD-10-CM | POA: Insufficient documentation

## 2022-06-04 DIAGNOSIS — C9000 Multiple myeloma not having achieved remission: Secondary | ICD-10-CM | POA: Insufficient documentation

## 2022-06-04 DIAGNOSIS — Z13820 Encounter for screening for osteoporosis: Secondary | ICD-10-CM | POA: Insufficient documentation

## 2022-06-04 DIAGNOSIS — Z8546 Personal history of malignant neoplasm of prostate: Secondary | ICD-10-CM | POA: Diagnosis not present

## 2022-06-04 DIAGNOSIS — Z7952 Long term (current) use of systemic steroids: Secondary | ICD-10-CM | POA: Insufficient documentation

## 2022-06-04 LAB — KAPPA/LAMBDA LIGHT CHAINS
Kappa free light chain: 6.8 mg/L (ref 3.3–19.4)
Kappa, lambda light chain ratio: 2.19 — ABNORMAL HIGH (ref 0.26–1.65)
Lambda free light chains: 3.1 mg/L — ABNORMAL LOW (ref 5.7–26.3)

## 2022-06-05 LAB — PROTEIN ELECTROPHORESIS, SERUM
A/G Ratio: 1.3 (ref 0.7–1.7)
Albumin ELP: 3.1 g/dL (ref 2.9–4.4)
Alpha-1-Globulin: 0.2 g/dL (ref 0.0–0.4)
Alpha-2-Globulin: 0.6 g/dL (ref 0.4–1.0)
Beta Globulin: 0.8 g/dL (ref 0.7–1.3)
Gamma Globulin: 0.7 g/dL (ref 0.4–1.8)
Globulin, Total: 2.3 g/dL (ref 2.2–3.9)
M-Spike, %: 0.6 g/dL — ABNORMAL HIGH
Total Protein ELP: 5.4 g/dL — ABNORMAL LOW (ref 6.0–8.5)

## 2022-06-10 ENCOUNTER — Inpatient Hospital Stay (HOSPITAL_COMMUNITY): Payer: Medicare HMO

## 2022-06-10 ENCOUNTER — Inpatient Hospital Stay (HOSPITAL_BASED_OUTPATIENT_CLINIC_OR_DEPARTMENT_OTHER): Payer: Medicare HMO | Admitting: Hematology

## 2022-06-10 VITALS — HR 60 | Temp 98.1°F | Resp 18

## 2022-06-10 DIAGNOSIS — Z5112 Encounter for antineoplastic immunotherapy: Secondary | ICD-10-CM | POA: Diagnosis not present

## 2022-06-10 DIAGNOSIS — C61 Malignant neoplasm of prostate: Secondary | ICD-10-CM | POA: Diagnosis not present

## 2022-06-10 DIAGNOSIS — C9000 Multiple myeloma not having achieved remission: Secondary | ICD-10-CM

## 2022-06-10 DIAGNOSIS — Z79818 Long term (current) use of other agents affecting estrogen receptors and estrogen levels: Secondary | ICD-10-CM | POA: Diagnosis not present

## 2022-06-10 DIAGNOSIS — Z79899 Other long term (current) drug therapy: Secondary | ICD-10-CM | POA: Diagnosis not present

## 2022-06-10 DIAGNOSIS — Z5111 Encounter for antineoplastic chemotherapy: Secondary | ICD-10-CM | POA: Diagnosis not present

## 2022-06-10 DIAGNOSIS — E559 Vitamin D deficiency, unspecified: Secondary | ICD-10-CM | POA: Diagnosis not present

## 2022-06-10 LAB — COMPREHENSIVE METABOLIC PANEL
ALT: 26 U/L (ref 0–44)
AST: 25 U/L (ref 15–41)
Albumin: 3.2 g/dL — ABNORMAL LOW (ref 3.5–5.0)
Alkaline Phosphatase: 75 U/L (ref 38–126)
Anion gap: 4 — ABNORMAL LOW (ref 5–15)
BUN: 21 mg/dL (ref 8–23)
CO2: 26 mmol/L (ref 22–32)
Calcium: 8.7 mg/dL — ABNORMAL LOW (ref 8.9–10.3)
Chloride: 110 mmol/L (ref 98–111)
Creatinine, Ser: 1.3 mg/dL — ABNORMAL HIGH (ref 0.61–1.24)
GFR, Estimated: 59 mL/min — ABNORMAL LOW (ref >60)
Glucose, Bld: 102 mg/dL — ABNORMAL HIGH (ref 70–99)
Potassium: 3.7 mmol/L (ref 3.5–5.1)
Sodium: 140 mmol/L (ref 135–145)
Total Bilirubin: 0.4 mg/dL (ref 0.3–1.2)
Total Protein: 5.6 g/dL — ABNORMAL LOW (ref 6.5–8.1)

## 2022-06-10 LAB — CBC WITH DIFFERENTIAL/PLATELET
Abs Immature Granulocytes: 0.01 K/uL (ref 0.00–0.07)
Basophils Absolute: 0.1 K/uL (ref 0.0–0.1)
Basophils Relative: 2 %
Eosinophils Absolute: 0.3 K/uL (ref 0.0–0.5)
Eosinophils Relative: 7 %
HCT: 29.8 % — ABNORMAL LOW (ref 39.0–52.0)
Hemoglobin: 10.1 g/dL — ABNORMAL LOW (ref 13.0–17.0)
Immature Granulocytes: 0 %
Lymphocytes Relative: 21 %
Lymphs Abs: 0.8 K/uL (ref 0.7–4.0)
MCH: 31.7 pg (ref 26.0–34.0)
MCHC: 33.9 g/dL (ref 30.0–36.0)
MCV: 93.4 fL (ref 80.0–100.0)
Monocytes Absolute: 0.5 K/uL (ref 0.1–1.0)
Monocytes Relative: 12 %
Neutro Abs: 2.1 K/uL (ref 1.7–7.7)
Neutrophils Relative %: 58 %
Platelets: 332 K/uL (ref 150–400)
RBC: 3.19 MIL/uL — ABNORMAL LOW (ref 4.22–5.81)
RDW: 16.5 % — ABNORMAL HIGH (ref 11.5–15.5)
WBC: 3.7 K/uL — ABNORMAL LOW (ref 4.0–10.5)
nRBC: 0 % (ref 0.0–0.2)

## 2022-06-10 LAB — MAGNESIUM: Magnesium: 1.8 mg/dL (ref 1.7–2.4)

## 2022-06-10 MED ORDER — DARATUMUMAB-HYALURONIDASE-FIHJ 1800-30000 MG-UT/15ML ~~LOC~~ SOLN
1800.0000 mg | Freq: Once | SUBCUTANEOUS | Status: AC
  Administered 2022-06-10: 1800 mg via SUBCUTANEOUS
  Filled 2022-06-10: qty 15

## 2022-06-10 MED ORDER — HEPARIN SOD (PORK) LOCK FLUSH 100 UNIT/ML IV SOLN
500.0000 [IU] | Freq: Once | INTRAVENOUS | Status: AC | PRN
  Administered 2022-06-10: 500 [IU]

## 2022-06-10 MED ORDER — DEGARELIX ACETATE 80 MG ~~LOC~~ SOLR
80.0000 mg | Freq: Once | SUBCUTANEOUS | Status: AC
  Administered 2022-06-10: 80 mg via SUBCUTANEOUS
  Filled 2022-06-10: qty 4

## 2022-06-10 MED ORDER — SODIUM CHLORIDE 0.9 % IV SOLN: Freq: Once | INTRAVENOUS | Status: AC

## 2022-06-10 MED ORDER — DIPHENHYDRAMINE HCL 25 MG PO CAPS
50.0000 mg | ORAL_CAPSULE | Freq: Once | ORAL | Status: AC
  Administered 2022-06-10: 50 mg via ORAL
  Filled 2022-06-10: qty 2

## 2022-06-10 MED ORDER — SODIUM CHLORIDE 0.9 % IV SOLN
20.0000 mg | Freq: Once | INTRAVENOUS | Status: AC
  Administered 2022-06-10: 20 mg via INTRAVENOUS
  Filled 2022-06-10: qty 2

## 2022-06-10 MED ORDER — DEXTROSE 5 % IV SOLN
56.0000 mg/m2 | Freq: Once | INTRAVENOUS | Status: AC
  Administered 2022-06-10: 110 mg via INTRAVENOUS
  Filled 2022-06-10: qty 55

## 2022-06-10 MED ORDER — SODIUM CHLORIDE 0.9% FLUSH
10.0000 mL | INTRAVENOUS | Status: DC | PRN
  Administered 2022-06-10: 10 mL

## 2022-06-10 MED ORDER — ACETAMINOPHEN 325 MG PO TABS
650.0000 mg | ORAL_TABLET | Freq: Once | ORAL | Status: AC
  Administered 2022-06-10: 650 mg via ORAL
  Filled 2022-06-10: qty 2

## 2022-06-10 NOTE — Progress Notes (Signed)
[place], Luis Stewart [id number]   CLINIC:  Medical Oncology/Hematology  PCP:  Luis Spar, MD [address] / [place] [address] [phone]   REASON FOR VISIT:  Follow-up for prostate cancer and multiple myeloma  PRIOR THERAPY: none  NGS Results: not done  CURRENT THERAPY: Carfilzomib (20/70) + Daratumumab SQ + Dexamethasone (20/40) DaraKRd q28d  BRIEF ONCOLOGIC HISTORY:  Oncology History  Multiple myeloma (Luis Stewart)  04/09/2022 Initial Diagnosis   Multiple myeloma (Luis Stewart)   04/15/2022 -  Chemotherapy   Patient is on Treatment Plan : MYELOMA RELAPSED/REFRACTORY Carfilzomib (20/70) + Daratumumab SQ + Dexamethasone (20/40) DaraKd q28d       CANCER STAGING:  Cancer Staging  Multiple myeloma (Luis Stewart) Staging form: Plasma Cell Myeloma and Plasma Cell Disorders, AJCC 8th Edition - Clinical: No stage assigned - Unsigned   INTERVAL HISTORY:  Luis Stewart, a 71 y.o. male, returns for routine follow-up and consideration for next cycle of chemotherapy. Luis Stewart was last seen on 05/13/2022.  Due for cycle #3 of carfilzomib and darzalex faspro today.   Overall, he tells me he has been feeling pretty well. He reports changed taste and occasional mild itching. He has gained 8 lbs since his last visit. He reports occasional aching CP which he reports was present for several years prior to treatment. He denies ankle swellings, SOB, and orthopnea. He will resume Revlimid today, and he reports tolerating it well. His tingling/numbness is stable. His appetite is good.   Overall, he feels ready for next cycle of chemo today.    REVIEW OF SYSTEMS:  Review of Systems  Constitutional:  Negative for appetite change and fatigue.  Respiratory:  Positive for cough. Negative for shortness of breath.   Cardiovascular:  Positive for chest pain. Negative for leg swelling.  Skin:  Positive for itching.  Neurological:  Positive for numbness  (stable).  All other systems reviewed and are negative.   PAST MEDICAL/SURGICAL HISTORY:  Past Medical History:  Diagnosis Date   Arthritis    Asbestosis (Luis Stewart)    Closed fracture of distal end of fibula with tibia with routine healing 05/28/2020   COVID-19 09/03/2021   Cyst of kidney, acquired    Headache    History of fracture of leg    Right, childhood   Hx of migraine headaches    Hyperlipidemia    Neuropathy    PE (pulmonary thromboembolism) (Luis Stewart)    After knee surgery   Prostate cancer (Luis Stewart) 2015   Adenocarcinoma by biopsy   Past Surgical History:  Procedure Laterality Date   BIOPSY PROSTATE  2003 and 2005   COLONOSCOPY  11/28/2008   Dr. Rourk:internal hemorrhoids/tortus colon/ascending colon polyps, tubular adenomas   COLONOSCOPY N/A 09/04/2014   Procedure: COLONOSCOPY;  Surgeon: Daneil Dolin, MD;  [facility];  Service: Endoscopy;  Laterality: N/A;  9:30   COLONOSCOPY N/A 07/17/2017   Procedure: COLONOSCOPY;  Surgeon: Danie Binder, MD;  [facility];  Service: Endoscopy;  Laterality: N/A;   ESOPHAGOGASTRODUODENOSCOPY N/A 07/16/2017   Procedure: ESOPHAGOGASTRODUODENOSCOPY (EGD);  Surgeon: Danie Binder, MD;  [facility];  Service: Endoscopy;  Laterality: N/A;   GIVENS CAPSULE STUDY  07/16/2017   Procedure: GIVENS CAPSULE STUDY;  Surgeon: Danie Binder, MD;  [facility];  Service: Endoscopy;;   JOINT REPLACEMENT N/A    Phreesia 01/12/2021   POLYPECTOMY  07/17/2017   Procedure: POLYPECTOMY;  Surgeon: Oneida Alar,  Marga Melnick, MD;  [facility];  Service: Endoscopy;;  cecal   PORTACATH PLACEMENT Left 04/18/2022   Procedure: INSERTION PORT-A-CATH;  Surgeon: Aviva Signs, MD;  Location: [facility];  Service: General;  Laterality: Left;   TOTAL KNEE ARTHROPLASTY Right 06/10/2017   TOTAL KNEE ARTHROPLASTY Right 06/10/2017   Procedure: TOTAL KNEE ARTHROPLASTY;  Surgeon: [name], MD;  Location: [facility];  Service:  Orthopedics;  Laterality: Right;    SOCIAL HISTORY:  Social History   Socioeconomic History   Marital status: Married    Spouse name: Not on file   Number of children: Not on file   Years of education: Not on file   Highest education level: Not on file  Occupational History   Occupation: Retired  Tobacco Use   Smoking status: Never   Smokeless tobacco: Never  Vaping Use   Vaping Use: Never used  Substance and Sexual Activity   Alcohol use: No   Drug use: No   Sexual activity: Yes  Other Topics Concern   Not on file  Social History Narrative   Not on file   Social Determinants of Health   Financial Resource Strain: Not on file  Food Insecurity: Not on file  Transportation Needs: No Transportation Needs (02/03/2022)   PRAPARE - Hydrologist (Medical): No    Lack of Transportation (Non-Medical): No  Physical Activity: Sufficiently Active (02/03/2022)   Exercise Vital Sign    Days of Exercise per Week: 4 days    Minutes of Exercise per Session: 60 min  Stress: Not on file  Social Connections: Moderately Integrated (02/03/2022)   Social Connection and Isolation Panel [NHANES]    Frequency of Communication with Friends and Family: More than three times a week    Frequency of Social Gatherings with Friends and Family: More than three times a week    Attends Religious Services: More than 4 times per year    Active Member of [employer] or Organizations: No    Attends Archivist Meetings: Never    Marital Status: Married  Human resources officer Violence: Not At Risk (02/03/2022)   Humiliation, Afraid, Rape, and Kick questionnaire    Fear of Current or Ex-Partner: No    Emotionally Abused: No    Physically Abused: No    Sexually Abused: No    FAMILY HISTORY:  Family History  Problem Relation Age of Onset   Diabetes Mother    Hypertension Mother    Obesity Mother    Heart disease Mother    Mental illness Mother    Hypertension Sister     Obesity Sister    Arthritis Sister    Deep vein thrombosis Father    Dementia Father    Heart disease Father        CABG   Colon cancer Neg Hx     CURRENT MEDICATIONS:  Current Outpatient Medications  Medication Sig Dispense Refill   acetaminophen (TYLENOL) 500 MG tablet Take 1,000 mg by mouth every 6 (six) hours as needed for mild pain or moderate pain.     acyclovir (ZOVIRAX) 400 MG tablet Take 1 tablet (400 mg total) by mouth 2 (two) times daily. 60 tablet 6   aspirin EC 81 MG tablet Take 81 mg by mouth daily. Swallow whole.     benzonatate (TESSALON) 100 MG capsule Take 1 capsule (100 mg total) by mouth 2 (two) times daily as needed for cough. 20 capsule 0   Carfilzomib (  KYPROLIS IV) Inject into the vein once a week.     Cholecalciferol (VITAMIN D) 2000 units CAPS Take 2,000 Units by mouth every other day.     daratumumab-hyaluronidase-fihj (DARZALEX FASPRO) 1800-30000 MG-UT/15ML SOLN Inject 1,800 mg into the skin once a week.     lenalidomide (REVLIMID) 15 MG capsule Take 1 capsule (15 mg total) by mouth daily. Take for 21 days, then hold for 7 days. Repeat every 28 days. 21 capsule 0   lidocaine-prilocaine (EMLA) cream Apply a small amount to port a cath site and cover with plastic wrap (do not rub in) 1 hour prior to infusion appointments (Patient not taking: Reported on 05/13/2022) 30 g 0   methocarbamol (ROBAXIN) 500 MG tablet Take 1 tablet (500 mg total) by mouth 3 (three) times daily. (Patient taking differently: Take 500 mg by mouth every 8 (eight) hours as needed for muscle spasms.) 60 tablet 1   Multiple Vitamin (MULTIVITAMIN WITH MINERALS) TABS tablet Take 1 tablet by mouth daily with breakfast. Centrum Silver for Men     nitroGLYCERIN (NITROSTAT) 0.4 MG SL tablet Place 1 tablet (0.4 mg total) under the tongue every 5 (five) minutes as needed for chest pain. 30 tablet 0   pantoprazole (PROTONIX) 40 MG tablet Take 1 tablet (40 mg total) by mouth every other day. 90 tablet 0    prochlorperazine (COMPAZINE) 10 MG tablet Take 1 tablet (10 mg total) by mouth every 6 (six) hours as needed for nausea or vomiting. (Patient not taking: Reported on 05/13/2022) 30 tablet 3   tamsulosin (FLOMAX) 0.4 MG CAPS capsule Take 1 capsule (0.4 mg total) by mouth daily. 30 capsule 11   traMADol (ULTRAM) 50 MG tablet Take 1 tablet (50 mg total) by mouth every 6 (six) hours as needed. 10 tablet 0   No current facility-administered medications for this visit.    ALLERGIES:  Allergies  Allergen Reactions   Sulfa Antibiotics Other (See Comments)    Unknown reaction. Childhood reaction    PHYSICAL EXAM:  Performance status (ECOG): 1 - Symptomatic but completely ambulatory  There were no vitals filed for this visit. Wt Readings from Last 3 Encounters:  06/03/22 181 lb 9.6 oz (82.4 kg)  05/27/22 176 lb (79.8 kg)  05/20/22 176 lb 6.4 oz (80 kg)   Physical Exam Vitals reviewed.  Constitutional:      Appearance: Normal appearance.  Cardiovascular:     Rate and Rhythm: Normal rate and regular rhythm.     Pulses: Normal pulses.     Heart sounds: Normal heart sounds.  Pulmonary:     Effort: Pulmonary effort is normal.     Breath sounds: Normal breath sounds.  Musculoskeletal:     Right lower leg: No edema.     Left lower leg: No edema.  Neurological:     General: No focal deficit present.     Mental Status: He is alert and oriented to person, place, and time.  Psychiatric:        Mood and Affect: Mood normal.        Behavior: Behavior normal.     LABORATORY DATA:  I have reviewed the labs as listed.     Latest Ref Rng & Units 06/03/2022    8:53 AM 05/27/2022    9:19 AM 05/20/2022    8:09 AM  CBC  WBC 4.0 - 10.5 K/uL 3.7  3.9  3.8   Hemoglobin 13.0 - 17.0 g/dL 10.0  10.3  10.6   Hematocrit 39.0 -  52.0 % 29.6  30.2  30.7   Platelets 150 - 400 K/uL 191  166  153       Latest Ref Rng & Units 06/03/2022    8:53 AM 05/27/2022    9:19 AM 05/20/2022    8:09 AM  CMP  Glucose  70 - 99 mg/dL 95  93  85   BUN 8 - 23 mg/dL _0 Creatinine 0.61 - 1.24 mg/dL 1.10  1.13  1.16   Sodium 135 - 145 mmol/L 139  141  139   Potassium 3.5 - 5.1 mmol/L 3.5  3.9  3.7   Chloride 98 - 111 mmol/L 107  107  107   CO2 22 - 32 mmol/L _1 Calcium 8.9 - 10.3 mg/dL 8.2  8.8  8.7   Total Protein 6.5 - 8.1 g/dL 5.6  6.1  6.3   Total Bilirubin 0.3 - 1.2 mg/dL 0.8  0.6  0.4   Alkaline Phos 38 - 126 U/L 82  86  94   AST 15 - 41 U/L _2 ALT 0 - 44 U/L _3 DIAGNOSTIC IMAGING:  I have independently reviewed the scans and discussed with the patient. DG Bone Density  Result Date: 06/04/2022 EXAM: DUAL X-RAY ABSORPTIOMETRY (DXA) FOR BONE MINERAL DENSITY IMPRESSION: Your patient Dareion Kneece completed a BMD test on 06/04/2022 using the [place] (software version: 14.10) manufactured by [employer]. The following summarizes the results of our evaluation. Technologist:[facility] PATIENT BIOGRAPHICAL: Name: Ezri, Landers Patient ID: [id number] Birth Date: 16-Jan-1951 Height: 73.6 in. Gender: Male Exam Date: 06/04/2022 Weight: 181.6 lbs. Indications: Chronic Steroid Use, History of Fracture (Adult), Prostate Cancer, Glucocorticoids (Chronic), Secondary Osteoporosis Fractures: Ankle Treatments: Asprin, Multivitamin, Vitamin D DENSITOMETRY RESULTS: Site      Region     Measured Date Measured Age WHO Classification Young Adult T-score BMD         %Change vs. Previous Significant Change (*) [facility] Spine L1-L3 06/04/2021 71.6 N/A -0.8 1.112 g/cm2 - - DualFemur Neck Right 06/04/2022 71.6 N/A -2.1 0.795 g/cm2 - - DualFemur Total Mean 06/04/2022 71.6 N/A -1.6 0.868 g/cm2 - - ASSESSMENT: BMD as determined from Femur Neck Right is 0.795 g/cm2 with a T-score of -2.1. This patient is considered osteopenic by World Healh Organization (WHO) Criteria. The scan quality is good. L4 was excluded due to advanced degenerative changes. World Pharmacologist [facility])  criteria for post-menopausal, Caucasian Women: Normal:       T-score at or above -1 SD Osteopenia:   T-score between -1 and -2.5 SD Osteoporosis: T-score at or below -2.5 SD RECOMMENDATIONS: 1. All patients should optimize calcium and vitamin D intake. 2. Consider FDA-approved medical therapies in postmenopausal women and med aged 2 years and older, based on the following: a. A hip or vertebral (clinical or morphometric) fracture b. T-score< -2.5 at the femoral neck or spine after appropriate evaluation to exclude secondary causes c. Low bone mass (T-score between -1.0 and -2.5 at the femoral neck or spine) and a 10-year probability of a hip fracture > 3% or a 10-year probability of a major osteoporosis-related fracture > 20% based on the [place]-adapted WHO algorithm d. Clinician judgment and/or patient preferences may indicate treatment for people with 10-year fracture probabilities above or below these levels FOLLOW-UP: People with diagnosed cases of osteoporosis or osteopenia should be regularly  tested for bone mineral density. For patients eligible for Medicare, routine testing is allowed once every 2 years. Testing frequency can be increased for patients who have rapidly progressing disease, or for those who are receiving medical therapy to restore bone mass. I have reviewed this report, and agree with the above findings. [facility] Your patient WALTER GRIMA completed a FRAX assessment on 06/04/2022 using the [place] (analysis version: 14.10) manufactured by [employer]. The following summarizes the results of our evaluation. PATIENT BIOGRAPHICAL: Name: Cloy, Cozzens Patient ID: [id number] Birth Date: 1951-09-18 Height:    73.6 in. Gender:     Male      [age]       Weight:    181.6 lbs. Ethnicity:  Black                            Exam Date: 06/04/2022 FRAX* RESULTS:  (version: 3.5) 10-year Probability of Fracture1 Major Osteoporotic Fracture2 Hip Fracture  7.6% 2.3% Population: [place] (Black) Risk Factors: History of Fracture (Adult), Glucocorticoids (Chronic), Secondary Osteoporosis Based on Femur (Right) Neck BMD 1 -The 10-year probability of fracture may be lower than reported if the patient has received treatment. 2 -Major Osteoporotic Fracture: Clinical Spine, Forearm, Hip or Shoulder *FRAX is a Materials engineer of the [employer] for Metabolic Bone Disease, a [address] (WHO) [facility]. ASSESSMENT: The probability of a major osteoporotic fracture is 7.6% within the next ten years. The probability of a hip fracture is 2.3% within the next ten years. Electronically Signed   By: Ammie Ferrier M.D.   On: 06/04/2022 13:25     ASSESSMENT:  Prostate cancer: - TRUS/Bx on 06/14/2015: 1/12 cores positive for adenocarcinoma-10% of the core in the right lateral base revealed Gleason 3+3=6.  Prostate volume 175 cc.  PSA was 16.6.  PSA density 0.09. - Seen by Dr. Louis Meckel in [facility] for robotic prostatectomy.  Due to low-volume prostate cancer and very low PSA density, it was recommended that he continue active surveillance. - 01/21/2016: Surveillance TRUS/biopsy, following a prostatic MRI which revealed no suspicious prostatic lesions and correlated the patient's large prostate.  All 12 cores were negative for prostate cancer.  Prostate volume was 190 mL. - 08/11/2018: Fusion TRUS/biopsy: Recent prostate MRI-volume 220 mL, no significant lesions.  No evidence of transcapsular/.  Ureteral, SV/bony or lymphatic disease.  Path-1 core (left apex) revealed Gleason 3+3 and 5% of core. - PSA: 17.16 (10/09/2015), 15.4 (11/04/2016), 17.9 (05/15/2017), 18.4 (04/19/2018), 15.9 (02/15/2019), 15.5 (03/20/2020), 22.4 (03/20/2021) - MRI of the lumbar spine with and without contrast on 11/27/2021 done for back pain showed expanded appearance of the sacrum with diffusely abnormal bone marrow signal and contrast-enhancement concerning  for metastatic disease. - CT pelvis with contrast on 11/27/2021: Expansile, heterogeneous trabeculated appearance of the sacrum with thickening of the cortex, unchanged from multiple prior exams.  No evidence of pelvic lymphadenopathy.  Extreme prostatomegaly. -PSMA PET scan (03/13/2022): Markedly enlarged prostate gland with focal activity inferior left aspect of the gland suggesting prostatic adenocarcinoma.  No nodal metastasis in the abdomen or pelvis.  Multifocal skeletal metastasis in the mid sacrum, right sacral ala, subtle lesions in the L2 and several rib lesions. - Degarelix started on 04/15/2022   Social/family history: - Lives at home with his wife.  Retired in 2003 from [employer].  He works part-time work  at an [employer]im[employer].  Non-smoker. - Sister had cholangiocarcinoma.  Paternal first cousin had metastatic cancer.  Paternal uncle had prostate cancer.  3.  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  Core biopsy demonstrates hypercellular marrow (95%) with large atypical plasma cells arranged in sheets, by CD138 IHC plasma cells comprise 70% of the cellular elements, kappa restricted.  Amyloid was negative. 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  Chronic progressive changes involving the sacrum, left iliac bone including accentuation of trabecular markings and cortical thickening with low FDG uptake suggestive of chronic Paget's disease.  Marked prostate gland enlargement. - BMBX (03/20/2022):Hypercellular bone marrow with at least 25% plasma cells on aspirate with atypical features.  Core biopsy demonstrates hypercellular marrow for age (95%) with large atypical  plasma cells arranged in sheets.  By CD138 IHC, plasma cells comprise approximately 70% of cellular elements and are kappa restricted.  Amyloid deposition was negative by Congo red.  Karyotype showed 20 q. deletion.  Myeloma FISH panel showed complex abnormalities. 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 - His PSA improved from 27-11.9 on 06/03/2022.  2.  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 - Reviewed myeloma labs from 06/03/2022.  M spike improved to 0.6 g from 4.3 g prior to start of therapy.  Free light chain ratio improved to 2.9 from more than 100. - He reports decrease in taste and itching in the legs and back since the start of therapy which are not affecting him in a serious way. - Reviewed labs today which showed white count is 3.7 and normal platelet count.  LFTs are normal.  Creatinine is 1.30. - He will start Revlimid 15 mg today 3 weeks on/1 week off. - He will receive 250 mL of normal saline today.  He will start with cycle 3 Dara KRD today. - He has an appointment to see Dr. Feliciana Rossetti on 07/31/2022. - I plan to see him back in 4 weeks for follow-up with repeat myeloma labs 1 week prior.  3.  Normocytic anemia: - Hemoglobin today is 10.1 and stable between 10-11.  4.  Bone health: - Bone density test on 06/04/2022: T score -2.1, osteopenia. - We will talk to him about initiating him on denosumab monthly.   Orders placed this encounter:  No orders of the defined types were placed in this encounter.    [name], MD [place] [phone]   I, Thana Ates, am acting as a scribe for Dr. [name].  I, [name] MD, have reviewed the above documentation for accuracy and completeness, and I agree with the above.

## 2022-06-10 NOTE — Patient Instructions (Signed)
[facility]  Discharge Instructions: Thank you for choosing [facility] to provide your oncology and hematology care.  If you have a lab appointment with the [facility], please come in thru the Main Entrance and check in at the main information desk.  Wear comfortable clothing and clothing appropriate for easy access to any Portacath or PICC line.   We strive to give you quality time with your provider. You may need to reschedule your appointment if you arrive late (15 or more minutes).  Arriving late affects you and other patients whose appointments are after yours.  Also, if you miss three or more appointments without notifying the office, you may be dismissed from the clinic at the provider's discretion.      For prescription refill requests, have your pharmacy contact our office and allow 72 hours for refills to be completed.    Today you received the following chemotherapy and/or immunotherapy agents kyprolis, darzalex, firmagon.  Daratumumab injection What is this medication? DARATUMUMAB (dar a toom ue mab) is a monoclonal antibody. It is used to treat multiple myeloma. This medicine may be used for other purposes; ask your health care provider or pharmacist if you have questions. COMMON BRAND NAME(S): DARZALEX What should I tell my care team before I take this medication? They need to know if you have any of these conditions: hereditary fructose intolerance infection (especially a virus infection such as chickenpox, herpes, or hepatitis B virus) lung or breathing disease (asthma, COPD) an unusual or allergic reaction to daratumumab, sorbitol, other medicines, foods, dyes, or preservatives pregnant or trying to get pregnant breast-feeding How should I use this medication? This medicine is for infusion into a vein. It is given by a health care professional in a hospital or clinic setting. Talk to your pediatrician regarding the use of this medicine in  children. Special care may be needed. Overdosage: If you think you have taken too much of this medicine contact a poison control center or emergency room at once. NOTE: This medicine is only for you. Do not share this medicine with others. What if I miss a dose? Keep appointments for follow-up doses as directed. It is important not to miss your dose. Call your doctor or health care professional if you are unable to keep an appointment. What may interact with this medication? Interactions have not been studied. This list may not describe all possible interactions. Give your health care provider a list of all the medicines, herbs, non-prescription drugs, or dietary supplements you use. Also tell them if you smoke, drink alcohol, or use illegal drugs. Some items may interact with your medicine. What should I watch for while using this medication? Your condition will be monitored carefully while you are receiving this medicine. This medicine can cause serious allergic reactions. To reduce your risk, your health care provider may give you other medicine to take before receiving this one. Be sure to follow the directions from your health care provider. This medicine can affect the results of blood tests to match your blood type. These changes can last for up to 6 months after the final dose. Your healthcare provider will do blood tests to match your blood type before you start treatment. Tell all of your healthcare providers that you are being treated with this medicine before receiving a blood transfusion. This medicine can affect the results of some tests used to determine treatment response; extra tests may be needed to evaluate response. Do not become  pregnant while taking this medicine or for 3 months after stopping it. Women should inform their health care provider if they wish to become pregnant or think they might be pregnant. There is a potential for serious side effects to an unborn child. Talk to  your health care provider for more information. Do not breast-feed an infant while taking this medicine. What side effects may I notice from receiving this medication? Side effects that you should report to your care team as soon as possible: Allergic reactions--skin rash, itching or hives, swelling of the face, lips, or tongue Blurred vision Infection--fever, chills, cough, sore throat, pain or trouble passing urine Infusion reactions--dizziness, fast heartbeat, feeling faint or lightheaded, falls, headache, increase in blood pressure, nausea, vomiting, or wheezing or trouble breathing with loud or whistling sounds Unusual bleeding or bruising Side effects that usually do not require medical attention (report to your care team if they continue or are bothersome): Constipation Diarrhea Pain, tingling, numbness in the hands or feet Swelling of the ankles, feet, hands Tiredness This list may not describe all possible side effects. Call your doctor for medical advice about side effects. You may report side effects to FDA at 1-800-FDA-1088. Where should I keep my medication? This drug is given in a hospital or clinic and will not be stored at home. NOTE: This sheet is a summary. It may not cover all possible information. If you have questions about this medicine, talk to your doctor, pharmacist, or health care provider.  2023 Elsevier/Gold Standard (2021-05-16 00:00:00) Degarelix injection What is this medication? DEGARELIX (deg a REL ix) is used to treat men with advanced prostate cancer. This medicine may be used for other purposes; ask your health care provider or pharmacist if you have questions. COMMON BRAND NAME(S): Degarelix, Mills Koller What should I tell my care team before I take this medication? They need to know if you have any of these conditions: diabetes heart disease kidney disease liver disease low levels of potassium or magnesium in the blood osteoporosis an unusual or  allergic reaction to degarelix, mannitol, other medicines, foods, dyes, or preservatives pregnant or trying to get pregnant breast-feeding How should I use this medication? This medicine is for injection under the skin. It is usually given by a health care professional in a hospital or clinic setting. If you get this medicine at home, you will be taught how to prepare and give this medicine. Use exactly as directed. Take your medicine at regular intervals. Do not take it more often than directed. It is important that you put your used needles and syringes in a special sharps container. Do not put them in a trash can. If you do not have a sharps container, call your pharmacist or healthcare provider to get one. Talk to your pediatrician regarding the use of this medicine in children. Special care may be needed. Overdosage: If you think you have taken too much of this medicine contact a poison control center or emergency room at once. NOTE: This medicine is only for you. Do not share this medicine with others. What if I miss a dose? Try not to miss a dose. If you do miss a dose, call your doctor or health care professional for advice. What may interact with this medication? Do not take this medicine with any of the following medications: cisapride dronedarone pimozide thioridazine This medicine may also interact with the following medications: other medicines that prolong the QT interval (abnormal heart rhythm) This list may not describe all  possible interactions. Give your health care provider a list of all the medicines, herbs, non-prescription drugs, or dietary supplements you use. Also tell them if you smoke, drink alcohol, or use illegal drugs. Some items may interact with your medicine. What should I watch for while using this medication? Visit your doctor or health care professional for regular checks on your progress and discuss any issues before you start taking this medicine. Do not  rub or scratch injection site. There may be a lump at the injection site, or it may be red or sore for a few days after your dose. Your doctor or health care professional will need to monitor your hormone levels in your blood to check your response to treatment. Try to keep any appointments for testing. What side effects may I notice from receiving this medication? Side effects that you should report to your doctor or health care professional as soon as possible: allergic reactions like skin rash, itching or hives, swelling of the face, lips, or tongue fever or chills irregular heartbeat nausea and vomiting along with severe abdominal pain pain or difficulty passing urine pelvic pain or bloating signs and symptoms of high blood sugar such as being more thirsty or hungry or having to urinate more than normal. You may also feel very tired or have blurry vision Side effects that usually do not require medical attention (report to your doctor or health care professional if they continue or are bothersome): change in sex drive or performance constipation headache high blood pressure hot flashes (flushing of skin, increased sweating) itching, redness or mild pain at site where injected joint pain trouble sleeping unusually weak or tired weight gain This list may not describe all possible side effects. Call your doctor for medical advice about side effects. You may report side effects to FDA at 1-800-FDA-1088. Where should I keep my medication? Keep out of the reach of children. This drug is usually given in a hospital or clinic and will not be stored at home. In rare cases, this medicine may be given at home. If you are using this medicine at home, you will be instructed on how to store this medicine. Throw away any unused medicine after the expiration date on the label. NOTE: This sheet is a summary. It may not cover all possible information. If you have questions about this medicine, talk to  your doctor, pharmacist, or health care provider.  2023 Elsevier/Gold Standard (2021-11-08 00:00:00) Carfilzomib injection What is this medication? CARFILZOMIB (kar FILZ oh mib) targets a specific protein within cancer cells and stops the cancer cells from growing. It is used to treat multiple myeloma. This medicine may be used for other purposes; ask your health care provider or pharmacist if you have questions. COMMON BRAND NAME(S): KYPROLIS What should I tell my care team before I take this medication? They need to know if you have any of these conditions: heart disease history of blood clots irregular heartbeat kidney disease liver disease lung or breathing disease an unusual or allergic reaction to carfilzomib, or other medicines, foods, dyes, or preservatives pregnant or trying to get pregnant breast-feeding How should I use this medication? This medicine is for injection or infusion into a vein. It is given by a health care professional in a hospital or clinic setting. Talk to your pediatrician regarding the use of this medicine in children. Special care may be needed. Overdosage: If you think you have taken too much of this medicine contact a poison control center or  emergency room at once. NOTE: This medicine is only for you. Do not share this medicine with others. What if I miss a dose? It is important not to miss your dose. Call your doctor or health care professional if you are unable to keep an appointment. What may interact with this medication? Interactions are not expected. This list may not describe all possible interactions. Give your health care provider a list of all the medicines, herbs, non-prescription drugs, or dietary supplements you use. Also tell them if you smoke, drink alcohol, or use illegal drugs. Some items may interact with your medicine. What should I watch for while using this medication? Your condition will be monitored while you are receiving this  medicine. You may need blood work done while you are taking this medicine. Do not become pregnant while taking this medicine or for 6 months after stopping it. Women should inform their health care provider if they wish to become pregnant or think they might be pregnant. Men should not father a child while taking this medicine and for 3 months after stopping it. There is a potential for serious side effects to an unborn child. Talk to your health care provider for more information. Do not breast-feed an infant while taking this medicine or for 2 weeks after stopping it. Check with your health care provider if you have severe diarrhea, nausea, and vomiting, or if you sweat a lot. The loss of too much body fluid may make it dangerous for you to take this medicine. You may get drowsy or dizzy. Do not drive, use machinery, or do anything that needs mental alertness until you know how this medicine affects you. Do not stand up or sit up quickly, especially if you are an older patient. This reduces the risk of dizzy or fainting spells. What side effects may I notice from receiving this medication? Side effects that you should report to your doctor or health care professional as soon as possible: allergic reactions like skin rash, itching or hives, swelling of the face, lips, or tongue confusion dizziness feeling faint or lightheaded fever or chills palpitations seizures signs and symptoms of bleeding such as bloody or black, tarry stools; red or dark-brown urine; spitting up blood or brown material that looks like coffee grounds; red spots on the skin; unusual bruising or bleeding including from the eye, gums, or nose signs and symptoms of a blood clot such as breathing problems; changes in vision; chest pain; severe, sudden headache; pain, swelling, warmth in the leg; trouble speaking; sudden numbness or weakness of the face, arm or leg signs and symptoms of kidney injury like trouble passing urine or  change in the amount of urine signs and symptoms of liver injury like dark yellow or brown urine; general ill feeling or flu-like symptoms; light-colored stools; loss of appetite; nausea; right upper belly pain; unusually weak or tired; yellowing of the eyes or skin Side effects that usually do not require medical attention (report to your doctor or health care professional if they continue or are bothersome): back pain cough diarrhea headache muscle cramps trouble sleeping vomiting This list may not describe all possible side effects. Call your doctor for medical advice about side effects. You may report side effects to FDA at 1-800-FDA-1088. Where should I keep my medication? This drug is given in a hospital or clinic and will not be stored at home. NOTE: This sheet is a summary. It may not cover all possible information. If you have questions about  this medicine, talk to your doctor, pharmacist, or health care provider.  2023 Elsevier/Gold Standard (2021-01-17 00:00:00)       To help prevent nausea and vomiting after your treatment, we encourage you to take your nausea medication as directed.  BELOW ARE SYMPTOMS THAT SHOULD BE REPORTED IMMEDIATELY: *FEVER GREATER THAN 100.4 F (38 C) OR HIGHER *CHILLS OR SWEATING *NAUSEA AND VOMITING THAT IS NOT CONTROLLED WITH YOUR NAUSEA MEDICATION *UNUSUAL SHORTNESS OF BREATH *UNUSUAL BRUISING OR BLEEDING *URINARY PROBLEMS (pain or burning when urinating, or frequent urination) *BOWEL PROBLEMS (unusual diarrhea, constipation, pain near the anus) TENDERNESS IN MOUTH AND THROAT WITH OR WITHOUT PRESENCE OF ULCERS (sore throat, sores in mouth, or a toothache) UNUSUAL RASH, SWELLING OR PAIN  UNUSUAL VAGINAL DISCHARGE OR ITCHING   Items with * indicate a potential emergency and should be followed up as soon as possible or go to the Emergency Department if any problems should occur.  Please show the CHEMOTHERAPY ALERT CARD or IMMUNOTHERAPY ALERT CARD  at check-in to the Emergency Department and triage nurse.  Should you have questions after your visit or need to cancel or reschedule your appointment, please contact [facility] [phone]  and follow the prompts.  Office hours are 8:00 a.m. to 4:30 p.m. Monday - Friday. Please note that voicemails left after 4:00 p.m. may not be returned until the following business day.  We are closed weekends and major holidays. You have access to a nurse at all times for urgent questions. Please call the main number to the clinic [phone] and follow the prompts.  For any non-urgent questions, you may also contact your provider using MyChart. We now offer e-Visits for anyone 56 and older to request care online for non-urgent symptoms. For details visit [email or web address].   Also download the MyChart app! Go to the app store, search "MyChart", open the app, select [facility], and log in with your MyChart username and password.  Masks are optional in the cancer centers. If you would like for your care team to wear a mask while they are taking care of you, please let them know. For doctor visits, patients may have with them one support person who is at least 72 years old. At this time, visitors are not allowed in the infusion area.

## 2022-06-10 NOTE — Patient Instructions (Addendum)
[place] at [facility] Discharge Instructions   You were seen and examined today by Dr. Delton Coombes.  He reviewed your lab work which is stable. Your PSA has decreased to 11 and your myeloma labs have improved significantly since the start of treatment. Your kidney function is a little elevated today. We will give you extra IV fluid today. Continue drinking your 80 ounces of water a day.   We will proceed with your treatment today.  Return as scheduled.    Thank you for choosing [facility] at [facility] to provide your oncology and hematology care.  To afford each patient quality time with our provider, please arrive at least 15 minutes before your scheduled appointment time.   If you have a lab appointment with the [facility] please come in thru the Main Entrance and check in at the main information desk.  You need to re-schedule your appointment should you arrive 10 or more minutes late.  We strive to give you quality time with our providers, and arriving late affects you and other patients whose appointments are after yours.  Also, if you no show three or more times for appointments you may be dismissed from the clinic at the providers discretion.     Again, thank you for choosing [facility].  Our hope is that these requests will decrease the amount of time that you wait before being seen by our physicians.       _____________________________________________________________  Should you have questions after your visit to [facility], please contact our office at [phone] and follow the prompts.  Our office hours are 8:00 a.m. and 4:30 p.m. Monday - Friday.  Please note that voicemails left after 4:00 p.m. may not be returned until the following business day.  We are closed weekends and major holidays.  You do have access to a nurse 24-7, just call the main number to the clinic [phone] and do not press any  options, hold on the line and a nurse will answer the phone.    For prescription refill requests, have your pharmacy contact our office and allow 72 hours.    Due to Covid, you will need to wear a mask upon entering the hospital. If you do not have a mask, a mask will be given to you at the Main Entrance upon arrival. For doctor visits, patients may have 1 support person age 51 or older with them. For treatment visits, patients can not have anyone with them due to social distancing guidelines and our immunocompromised population.

## 2022-06-10 NOTE — Progress Notes (Signed)
Patient tolerated chemotherapy with no complaints voiced.  Side effects with management reviewed with understanding verbalized.  Port site clean and dry with no bruising or swelling noted at site.  Good blood return noted before and after administration of chemotherapy.  Band aid applied.  Patient left in satisfactory condition with VSS and no s/s of distress noted.    Patient tolerated Daratumumab and Firmagon injection with no complaints voiced.  See MAR for details.  Labs reviewed. Injection site clean and dry with no bruising or swelling noted at site.  Band aid applied.  Vss with discharge and left in satisfactory condition with no s/s of distress noted.

## 2022-06-17 ENCOUNTER — Inpatient Hospital Stay (HOSPITAL_COMMUNITY): Payer: Medicare HMO

## 2022-06-17 ENCOUNTER — Encounter (HOSPITAL_COMMUNITY): Payer: Self-pay

## 2022-06-17 VITALS — BP 120/62 | HR 69 | Temp 97.5°F | Resp 18

## 2022-06-17 DIAGNOSIS — Z5112 Encounter for antineoplastic immunotherapy: Secondary | ICD-10-CM | POA: Diagnosis not present

## 2022-06-17 DIAGNOSIS — C61 Malignant neoplasm of prostate: Secondary | ICD-10-CM | POA: Diagnosis not present

## 2022-06-17 DIAGNOSIS — C9000 Multiple myeloma not having achieved remission: Secondary | ICD-10-CM | POA: Diagnosis not present

## 2022-06-17 DIAGNOSIS — Z79818 Long term (current) use of other agents affecting estrogen receptors and estrogen levels: Secondary | ICD-10-CM | POA: Diagnosis not present

## 2022-06-17 DIAGNOSIS — Z79899 Other long term (current) drug therapy: Secondary | ICD-10-CM | POA: Diagnosis not present

## 2022-06-17 DIAGNOSIS — Z5111 Encounter for antineoplastic chemotherapy: Secondary | ICD-10-CM | POA: Diagnosis not present

## 2022-06-17 LAB — COMPREHENSIVE METABOLIC PANEL
ALT: 36 U/L (ref 0–44)
AST: 36 U/L (ref 15–41)
Albumin: 3.5 g/dL (ref 3.5–5.0)
Alkaline Phosphatase: 72 U/L (ref 38–126)
Anion gap: 8 (ref 5–15)
BUN: 20 mg/dL (ref 8–23)
CO2: 28 mmol/L (ref 22–32)
Calcium: 8.7 mg/dL — ABNORMAL LOW (ref 8.9–10.3)
Chloride: 105 mmol/L (ref 98–111)
Creatinine, Ser: 1.28 mg/dL — ABNORMAL HIGH (ref 0.61–1.24)
GFR, Estimated: 60 mL/min — ABNORMAL LOW (ref >60)
Glucose, Bld: 87 mg/dL (ref 70–99)
Potassium: 3.8 mmol/L (ref 3.5–5.1)
Sodium: 141 mmol/L (ref 135–145)
Total Bilirubin: 0.8 mg/dL (ref 0.3–1.2)
Total Protein: 6.1 g/dL — ABNORMAL LOW (ref 6.5–8.1)

## 2022-06-17 LAB — CBC WITH DIFFERENTIAL/PLATELET
Abs Immature Granulocytes: 0.01 10*3/uL (ref 0.00–0.07)
Basophils Absolute: 0 10*3/uL (ref 0.0–0.1)
Basophils Relative: 1 %
Eosinophils Absolute: 0.5 10*3/uL (ref 0.0–0.5)
Eosinophils Relative: 15 %
HCT: 31.5 % — ABNORMAL LOW (ref 39.0–52.0)
Hemoglobin: 10.7 g/dL — ABNORMAL LOW (ref 13.0–17.0)
Immature Granulocytes: 0 %
Lymphocytes Relative: 14 %
Lymphs Abs: 0.5 10*3/uL — ABNORMAL LOW (ref 0.7–4.0)
MCH: 31.5 pg (ref 26.0–34.0)
MCHC: 34 g/dL (ref 30.0–36.0)
MCV: 92.6 fL (ref 80.0–100.0)
Monocytes Absolute: 0.2 10*3/uL (ref 0.1–1.0)
Monocytes Relative: 7 %
Neutro Abs: 2.2 10*3/uL (ref 1.7–7.7)
Neutrophils Relative %: 63 %
Platelets: 179 10*3/uL (ref 150–400)
RBC: 3.4 MIL/uL — ABNORMAL LOW (ref 4.22–5.81)
RDW: 15.6 % — ABNORMAL HIGH (ref 11.5–15.5)
WBC: 3.5 10*3/uL — ABNORMAL LOW (ref 4.0–10.5)
nRBC: 0 % (ref 0.0–0.2)

## 2022-06-17 LAB — MAGNESIUM: Magnesium: 2.1 mg/dL (ref 1.7–2.4)

## 2022-06-17 MED ORDER — HEPARIN SOD (PORK) LOCK FLUSH 100 UNIT/ML IV SOLN
500.0000 [IU] | Freq: Once | INTRAVENOUS | Status: AC | PRN
  Administered 2022-06-17: 500 [IU]

## 2022-06-17 MED ORDER — SODIUM CHLORIDE 0.9 % IV SOLN
20.0000 mg | Freq: Once | INTRAVENOUS | Status: AC
  Administered 2022-06-17: 20 mg via INTRAVENOUS
  Filled 2022-06-17: qty 2

## 2022-06-17 MED ORDER — SODIUM CHLORIDE 0.9 % IV SOLN: Freq: Once | INTRAVENOUS | Status: AC

## 2022-06-17 MED ORDER — SODIUM CHLORIDE 0.9% FLUSH
10.0000 mL | INTRAVENOUS | Status: DC | PRN
  Administered 2022-06-17 (×2): 10 mL

## 2022-06-17 MED ORDER — DEXTROSE 5 % IV SOLN
56.0000 mg/m2 | Freq: Once | INTRAVENOUS | Status: AC
  Administered 2022-06-17: 110 mg via INTRAVENOUS
  Filled 2022-06-17: qty 10

## 2022-06-17 NOTE — Progress Notes (Signed)
Patient presents today for Kyprolis. Labs within treatment parameters. Ser. Creatinine 1.28 Dr. Ellin Saba made aware, received orders for during treatment. Patient tolerated chemotherapy with no complaints voiced. Side effects with management reviewed understanding verbalized. Port site clean and dry with no bruising or swelling noted at site. Good blood return noted before and after administration of chemotherapy. Band aid applied. Patient left in satisfactory condition with VSS and no s/s of distress noted.

## 2022-06-25 ENCOUNTER — Inpatient Hospital Stay (HOSPITAL_COMMUNITY): Payer: Medicare HMO | Attending: Hematology

## 2022-06-25 ENCOUNTER — Inpatient Hospital Stay (HOSPITAL_COMMUNITY): Payer: Medicare HMO

## 2022-06-25 VITALS — BP 113/63 | HR 52 | Temp 96.5°F | Resp 18

## 2022-06-25 DIAGNOSIS — C7951 Secondary malignant neoplasm of bone: Secondary | ICD-10-CM | POA: Diagnosis not present

## 2022-06-25 DIAGNOSIS — Z5112 Encounter for antineoplastic immunotherapy: Secondary | ICD-10-CM | POA: Insufficient documentation

## 2022-06-25 DIAGNOSIS — C9000 Multiple myeloma not having achieved remission: Secondary | ICD-10-CM | POA: Insufficient documentation

## 2022-06-25 DIAGNOSIS — Z79899 Other long term (current) drug therapy: Secondary | ICD-10-CM | POA: Insufficient documentation

## 2022-06-25 DIAGNOSIS — C61 Malignant neoplasm of prostate: Secondary | ICD-10-CM | POA: Insufficient documentation

## 2022-06-25 LAB — CBC WITH DIFFERENTIAL/PLATELET
Abs Immature Granulocytes: 0.02 10*3/uL (ref 0.00–0.07)
Basophils Absolute: 0.1 10*3/uL (ref 0.0–0.1)
Basophils Relative: 2 %
Eosinophils Absolute: 0.9 10*3/uL — ABNORMAL HIGH (ref 0.0–0.5)
Eosinophils Relative: 18 %
HCT: 33 % — ABNORMAL LOW (ref 39.0–52.0)
Hemoglobin: 11 g/dL — ABNORMAL LOW (ref 13.0–17.0)
Immature Granulocytes: 0 %
Lymphocytes Relative: 12 %
Lymphs Abs: 0.6 10*3/uL — ABNORMAL LOW (ref 0.7–4.0)
MCH: 31 pg (ref 26.0–34.0)
MCHC: 33.3 g/dL (ref 30.0–36.0)
MCV: 93 fL (ref 80.0–100.0)
Monocytes Absolute: 0.5 10*3/uL (ref 0.1–1.0)
Monocytes Relative: 10 %
Neutro Abs: 3 10*3/uL (ref 1.7–7.7)
Neutrophils Relative %: 58 %
Platelets: 204 10*3/uL (ref 150–400)
RBC: 3.55 MIL/uL — ABNORMAL LOW (ref 4.22–5.81)
RDW: 15.2 % (ref 11.5–15.5)
WBC: 5.1 10*3/uL (ref 4.0–10.5)
nRBC: 0 % (ref 0.0–0.2)

## 2022-06-25 LAB — COMPREHENSIVE METABOLIC PANEL
ALT: 34 U/L (ref 0–44)
AST: 26 U/L (ref 15–41)
Albumin: 3.4 g/dL — ABNORMAL LOW (ref 3.5–5.0)
Alkaline Phosphatase: 70 U/L (ref 38–126)
Anion gap: 6 (ref 5–15)
BUN: 18 mg/dL (ref 8–23)
CO2: 27 mmol/L (ref 22–32)
Calcium: 8.5 mg/dL — ABNORMAL LOW (ref 8.9–10.3)
Chloride: 103 mmol/L (ref 98–111)
Creatinine, Ser: 1.09 mg/dL (ref 0.61–1.24)
GFR, Estimated: >60 mL/min (ref >60)
Glucose, Bld: 102 mg/dL — ABNORMAL HIGH (ref 70–99)
Potassium: 3.8 mmol/L (ref 3.5–5.1)
Sodium: 136 mmol/L (ref 135–145)
Total Bilirubin: 0.4 mg/dL (ref 0.3–1.2)
Total Protein: 5.8 g/dL — ABNORMAL LOW (ref 6.5–8.1)

## 2022-06-25 LAB — MAGNESIUM: Magnesium: 1.9 mg/dL (ref 1.7–2.4)

## 2022-06-25 MED ORDER — HEPARIN SOD (PORK) LOCK FLUSH 100 UNIT/ML IV SOLN
500.0000 [IU] | Freq: Once | INTRAVENOUS | Status: AC | PRN
  Administered 2022-06-25: 500 [IU]

## 2022-06-25 MED ORDER — SODIUM CHLORIDE 0.9% FLUSH
10.0000 mL | INTRAVENOUS | Status: DC | PRN
  Administered 2022-06-25: 10 mL

## 2022-06-25 MED ORDER — DIPHENHYDRAMINE HCL 25 MG PO CAPS
50.0000 mg | ORAL_CAPSULE | Freq: Once | ORAL | Status: AC
  Administered 2022-06-25: 50 mg via ORAL
  Filled 2022-06-25: qty 2

## 2022-06-25 MED ORDER — SODIUM CHLORIDE 0.9 % IV SOLN: Freq: Once | INTRAVENOUS | Status: AC

## 2022-06-25 MED ORDER — DARATUMUMAB-HYALURONIDASE-FIHJ 1800-30000 MG-UT/15ML ~~LOC~~ SOLN
1800.0000 mg | Freq: Once | SUBCUTANEOUS | Status: AC
  Administered 2022-06-25: 1800 mg via SUBCUTANEOUS
  Filled 2022-06-25: qty 15

## 2022-06-25 MED ORDER — SODIUM CHLORIDE 0.9 % IV SOLN
20.0000 mg | Freq: Once | INTRAVENOUS | Status: AC
  Administered 2022-06-25: 20 mg via INTRAVENOUS
  Filled 2022-06-25: qty 2

## 2022-06-25 MED ORDER — DEXTROSE 5 % IV SOLN
56.0000 mg/m2 | Freq: Once | INTRAVENOUS | Status: AC
  Administered 2022-06-25: 110 mg via INTRAVENOUS
  Filled 2022-06-25: qty 30

## 2022-06-25 MED ORDER — ACETAMINOPHEN 325 MG PO TABS
650.0000 mg | ORAL_TABLET | Freq: Once | ORAL | Status: AC
  Administered 2022-06-25: 650 mg via ORAL
  Filled 2022-06-25: qty 2

## 2022-06-25 NOTE — Progress Notes (Signed)
Patient presents today for Kyprolis infusion and Daratumumab injection per providers order.  Vital signs and labs within parameters for treatment.  Patient has no new complaints at this time.   Kyprolis and Daratumumab given today per MD orders.  Stable during infusion and injection without adverse affects.  Vital signs stable.  No complaints at this time.  Discharge from clinic ambulatory in stable condition.  Alert and oriented X 3.  Follow up with [facility] as scheduled.

## 2022-06-25 NOTE — Patient Instructions (Signed)
[place]  Discharge Instructions: Thank you for choosing [facility] to provide your oncology and hematology care.  If you have a lab appointment with the [facility], please come in thru the Main Entrance and check in at the main information desk.  Wear comfortable clothing and clothing appropriate for easy access to any Portacath or PICC line.   We strive to give you quality time with your provider. You may need to reschedule your appointment if you arrive late (15 or more minutes).  Arriving late affects you and other patients whose appointments are after yours.  Also, if you miss three or more appointments without notifying the office, you may be dismissed from the clinic at the provider's discretion.      For prescription refill requests, have your pharmacy contact our office and allow 72 hours for refills to be completed.    Today you received the following chemotherapy and/or immunotherapy agents Kyprolis and Daratumumab      To help prevent nausea and vomiting after your treatment, we encourage you to take your nausea medication as directed.  BELOW ARE SYMPTOMS THAT SHOULD BE REPORTED IMMEDIATELY: *FEVER GREATER THAN 100.4 F (38 C) OR HIGHER *CHILLS OR SWEATING *NAUSEA AND VOMITING THAT IS NOT CONTROLLED WITH YOUR NAUSEA MEDICATION *UNUSUAL SHORTNESS OF BREATH *UNUSUAL BRUISING OR BLEEDING *URINARY PROBLEMS (pain or burning when urinating, or frequent urination) *BOWEL PROBLEMS (unusual diarrhea, constipation, pain near the anus) TENDERNESS IN MOUTH AND THROAT WITH OR WITHOUT PRESENCE OF ULCERS (sore throat, sores in mouth, or a toothache) UNUSUAL RASH, SWELLING OR PAIN  UNUSUAL VAGINAL DISCHARGE OR ITCHING   Items with * indicate a potential emergency and should be followed up as soon as possible or go to the Emergency Department if any problems should occur.  Please show the CHEMOTHERAPY ALERT CARD or IMMUNOTHERAPY ALERT CARD at check-in to the  Emergency Department and triage nurse.  Should you have questions after your visit or need to cancel or reschedule your appointment, please contact [facility] [phone]  and follow the prompts.  Office hours are 8:00 a.m. to 4:30 p.m. Monday - Friday. Please note that voicemails left after 4:00 p.m. may not be returned until the following business day.  We are closed weekends and major holidays. You have access to a nurse at all times for urgent questions. Please call the main number to the clinic [phone] and follow the prompts.  For any non-urgent questions, you may also contact your provider using MyChart. We now offer e-Visits for anyone 61 and older to request care online for non-urgent symptoms. For details visit [email or web address].   Also download the MyChart app! Go to the app store, search "MyChart", open the app, select [facility], and log in with your MyChart username and password.  Masks are optional in the cancer centers. If you would like for your care team to wear a mask while they are taking care of you, please let them know. For doctor visits, patients may have with them one support person who is at least 72 years old. At this time, visitors are not allowed in the infusion area.

## 2022-06-30 ENCOUNTER — Encounter (HOSPITAL_COMMUNITY): Payer: Self-pay | Admitting: Hematology

## 2022-06-30 ENCOUNTER — Other Ambulatory Visit (HOSPITAL_COMMUNITY): Payer: Self-pay

## 2022-06-30 DIAGNOSIS — C9000 Multiple myeloma not having achieved remission: Secondary | ICD-10-CM

## 2022-06-30 MED ORDER — LENALIDOMIDE 15 MG PO CAPS: 15.0000 mg | ORAL_CAPSULE | Freq: Every day | ORAL | 0 refills | Status: DC

## 2022-06-30 NOTE — Telephone Encounter (Signed)
Chart reviewed. Revlimid refilled per last office note with Dr. Katragadda.  

## 2022-07-01 ENCOUNTER — Encounter (HOSPITAL_COMMUNITY): Payer: Self-pay

## 2022-07-01 ENCOUNTER — Inpatient Hospital Stay (HOSPITAL_COMMUNITY): Payer: Medicare HMO

## 2022-07-01 DIAGNOSIS — Z79899 Other long term (current) drug therapy: Secondary | ICD-10-CM | POA: Diagnosis not present

## 2022-07-01 DIAGNOSIS — C61 Malignant neoplasm of prostate: Secondary | ICD-10-CM | POA: Diagnosis not present

## 2022-07-01 DIAGNOSIS — C9000 Multiple myeloma not having achieved remission: Secondary | ICD-10-CM

## 2022-07-01 DIAGNOSIS — Z5112 Encounter for antineoplastic immunotherapy: Secondary | ICD-10-CM | POA: Diagnosis not present

## 2022-07-01 DIAGNOSIS — C7951 Secondary malignant neoplasm of bone: Secondary | ICD-10-CM | POA: Diagnosis not present

## 2022-07-01 LAB — COMPREHENSIVE METABOLIC PANEL
ALT: 27 U/L (ref 0–44)
AST: 18 U/L (ref 15–41)
Albumin: 3.4 g/dL — ABNORMAL LOW (ref 3.5–5.0)
Alkaline Phosphatase: 68 U/L (ref 38–126)
Anion gap: 2 — ABNORMAL LOW (ref 5–15)
BUN: 20 mg/dL (ref 8–23)
CO2: 28 mmol/L (ref 22–32)
Calcium: 8.4 mg/dL — ABNORMAL LOW (ref 8.9–10.3)
Chloride: 106 mmol/L (ref 98–111)
Creatinine, Ser: 1.08 mg/dL (ref 0.61–1.24)
GFR, Estimated: >60 mL/min (ref >60)
Glucose, Bld: 89 mg/dL (ref 70–99)
Potassium: 3.7 mmol/L (ref 3.5–5.1)
Sodium: 136 mmol/L (ref 135–145)
Total Bilirubin: 1 mg/dL (ref 0.3–1.2)
Total Protein: 5.8 g/dL — ABNORMAL LOW (ref 6.5–8.1)

## 2022-07-01 LAB — CBC WITH DIFFERENTIAL/PLATELET
Abs Immature Granulocytes: 0.01 10*3/uL (ref 0.00–0.07)
Basophils Absolute: 0 10*3/uL (ref 0.0–0.1)
Basophils Relative: 1 %
Eosinophils Absolute: 1 10*3/uL — ABNORMAL HIGH (ref 0.0–0.5)
Eosinophils Relative: 25 %
HCT: 30.7 % — ABNORMAL LOW (ref 39.0–52.0)
Hemoglobin: 10.3 g/dL — ABNORMAL LOW (ref 13.0–17.0)
Immature Granulocytes: 0 %
Lymphocytes Relative: 15 %
Lymphs Abs: 0.6 10*3/uL — ABNORMAL LOW (ref 0.7–4.0)
MCH: 31.1 pg (ref 26.0–34.0)
MCHC: 33.6 g/dL (ref 30.0–36.0)
MCV: 92.7 fL (ref 80.0–100.0)
Monocytes Absolute: 0.4 10*3/uL (ref 0.1–1.0)
Monocytes Relative: 10 %
Neutro Abs: 1.9 10*3/uL (ref 1.7–7.7)
Neutrophils Relative %: 49 %
Platelets: 149 10*3/uL — ABNORMAL LOW (ref 150–400)
RBC: 3.31 MIL/uL — ABNORMAL LOW (ref 4.22–5.81)
RDW: 14.9 % (ref 11.5–15.5)
WBC: 3.9 10*3/uL — ABNORMAL LOW (ref 4.0–10.5)
nRBC: 0 % (ref 0.0–0.2)

## 2022-07-01 LAB — LACTATE DEHYDROGENASE: LDH: 143 U/L (ref 98–192)

## 2022-07-01 LAB — MAGNESIUM: Magnesium: 1.9 mg/dL (ref 1.7–2.4)

## 2022-07-01 LAB — PSA: Prostatic Specific Antigen: 4.84 ng/mL — ABNORMAL HIGH (ref 0.00–4.00)

## 2022-07-01 MED ORDER — HEPARIN SOD (PORK) LOCK FLUSH 100 UNIT/ML IV SOLN
500.0000 [IU] | Freq: Once | INTRAVENOUS | Status: AC
  Administered 2022-07-01: 500 [IU] via INTRAVENOUS

## 2022-07-01 MED ORDER — SODIUM CHLORIDE 0.9% FLUSH
10.0000 mL | Freq: Once | INTRAVENOUS | Status: AC
  Administered 2022-07-01: 10 mL via INTRAVENOUS

## 2022-07-02 LAB — KAPPA/LAMBDA LIGHT CHAINS
Kappa free light chain: 6.1 mg/L (ref 3.3–19.4)
Kappa, lambda light chain ratio: 1.91 — ABNORMAL HIGH (ref 0.26–1.65)
Lambda free light chains: 3.2 mg/L — ABNORMAL LOW (ref 5.7–26.3)

## 2022-07-03 LAB — PROTEIN ELECTROPHORESIS, SERUM
A/G Ratio: 1.4 (ref 0.7–1.7)
Albumin ELP: 3 g/dL (ref 2.9–4.4)
Alpha-1-Globulin: 0.3 g/dL (ref 0.0–0.4)
Alpha-2-Globulin: 0.5 g/dL (ref 0.4–1.0)
Beta Globulin: 0.8 g/dL (ref 0.7–1.3)
Gamma Globulin: 0.5 g/dL (ref 0.4–1.8)
Globulin, Total: 2.1 g/dL — ABNORMAL LOW (ref 2.2–3.9)
M-Spike, %: 0.3 g/dL — ABNORMAL HIGH
Total Protein ELP: 5.1 g/dL — ABNORMAL LOW (ref 6.0–8.5)

## 2022-07-08 ENCOUNTER — Inpatient Hospital Stay (HOSPITAL_COMMUNITY): Payer: Medicare HMO

## 2022-07-08 ENCOUNTER — Inpatient Hospital Stay (HOSPITAL_BASED_OUTPATIENT_CLINIC_OR_DEPARTMENT_OTHER): Payer: Medicare HMO | Admitting: Hematology

## 2022-07-08 VITALS — BP 138/74 | HR 66 | Temp 97.8°F | Resp 18 | Ht 73.43 in | Wt 185.8 lb

## 2022-07-08 VITALS — BP 199/66 | HR 67 | Temp 97.8°F | Resp 18

## 2022-07-08 DIAGNOSIS — C61 Malignant neoplasm of prostate: Secondary | ICD-10-CM

## 2022-07-08 DIAGNOSIS — C9000 Multiple myeloma not having achieved remission: Secondary | ICD-10-CM | POA: Diagnosis not present

## 2022-07-08 DIAGNOSIS — Z5112 Encounter for antineoplastic immunotherapy: Secondary | ICD-10-CM | POA: Diagnosis not present

## 2022-07-08 DIAGNOSIS — C7951 Secondary malignant neoplasm of bone: Secondary | ICD-10-CM | POA: Diagnosis not present

## 2022-07-08 DIAGNOSIS — Z79899 Other long term (current) drug therapy: Secondary | ICD-10-CM | POA: Diagnosis not present

## 2022-07-08 LAB — COMPREHENSIVE METABOLIC PANEL
ALT: 30 U/L (ref 0–44)
AST: 23 U/L (ref 15–41)
Albumin: 3.3 g/dL — ABNORMAL LOW (ref 3.5–5.0)
Alkaline Phosphatase: 66 U/L (ref 38–126)
Anion gap: 6 (ref 5–15)
BUN: 22 mg/dL (ref 8–23)
CO2: 27 mmol/L (ref 22–32)
Calcium: 8.7 mg/dL — ABNORMAL LOW (ref 8.9–10.3)
Chloride: 107 mmol/L (ref 98–111)
Creatinine, Ser: 1.21 mg/dL (ref 0.61–1.24)
GFR, Estimated: >60 mL/min (ref >60)
Glucose, Bld: 83 mg/dL (ref 70–99)
Potassium: 3.6 mmol/L (ref 3.5–5.1)
Sodium: 140 mmol/L (ref 135–145)
Total Bilirubin: 0.6 mg/dL (ref 0.3–1.2)
Total Protein: 5.7 g/dL — ABNORMAL LOW (ref 6.5–8.1)

## 2022-07-08 LAB — CBC WITH DIFFERENTIAL/PLATELET
Abs Immature Granulocytes: 0.01 10*3/uL (ref 0.00–0.07)
Basophils Absolute: 0.1 10*3/uL (ref 0.0–0.1)
Basophils Relative: 3 %
Eosinophils Absolute: 0.5 10*3/uL (ref 0.0–0.5)
Eosinophils Relative: 11 %
HCT: 29.8 % — ABNORMAL LOW (ref 39.0–52.0)
Hemoglobin: 9.9 g/dL — ABNORMAL LOW (ref 13.0–17.0)
Immature Granulocytes: 0 %
Lymphocytes Relative: 18 %
Lymphs Abs: 0.7 10*3/uL (ref 0.7–4.0)
MCH: 31 pg (ref 26.0–34.0)
MCHC: 33.2 g/dL (ref 30.0–36.0)
MCV: 93.4 fL (ref 80.0–100.0)
Monocytes Absolute: 0.5 10*3/uL (ref 0.1–1.0)
Monocytes Relative: 12 %
Neutro Abs: 2.3 10*3/uL (ref 1.7–7.7)
Neutrophils Relative %: 56 %
Platelets: 277 10*3/uL (ref 150–400)
RBC: 3.19 MIL/uL — ABNORMAL LOW (ref 4.22–5.81)
RDW: 15 % (ref 11.5–15.5)
WBC: 4 10*3/uL (ref 4.0–10.5)
nRBC: 0 % (ref 0.0–0.2)

## 2022-07-08 LAB — MAGNESIUM: Magnesium: 2 mg/dL (ref 1.7–2.4)

## 2022-07-08 MED ORDER — SODIUM CHLORIDE 0.9 % IV SOLN: Freq: Once | INTRAVENOUS | Status: AC

## 2022-07-08 MED ORDER — DEXTROSE 5 % IV SOLN
56.0000 mg/m2 | Freq: Once | INTRAVENOUS | Status: AC
  Administered 2022-07-08: 110 mg via INTRAVENOUS
  Filled 2022-07-08: qty 10

## 2022-07-08 MED ORDER — ACETAMINOPHEN 325 MG PO TABS
650.0000 mg | ORAL_TABLET | Freq: Once | ORAL | Status: AC
  Administered 2022-07-08: 650 mg via ORAL
  Filled 2022-07-08: qty 2

## 2022-07-08 MED ORDER — DARATUMUMAB-HYALURONIDASE-FIHJ 1800-30000 MG-UT/15ML ~~LOC~~ SOLN
1800.0000 mg | Freq: Once | SUBCUTANEOUS | Status: AC
  Administered 2022-07-08: 1800 mg via SUBCUTANEOUS
  Filled 2022-07-08: qty 15

## 2022-07-08 MED ORDER — SODIUM CHLORIDE 0.9% FLUSH
10.0000 mL | INTRAVENOUS | Status: DC | PRN
  Administered 2022-07-08: 10 mL

## 2022-07-08 MED ORDER — DEGARELIX ACETATE 80 MG ~~LOC~~ SOLR
80.0000 mg | Freq: Once | SUBCUTANEOUS | Status: AC
  Administered 2022-07-08: 80 mg via SUBCUTANEOUS
  Filled 2022-07-08: qty 4

## 2022-07-08 MED ORDER — SODIUM CHLORIDE 0.9 % IV SOLN
20.0000 mg | Freq: Once | INTRAVENOUS | Status: AC
  Administered 2022-07-08: 20 mg via INTRAVENOUS
  Filled 2022-07-08: qty 20

## 2022-07-08 MED ORDER — DIPHENHYDRAMINE HCL 25 MG PO CAPS
50.0000 mg | ORAL_CAPSULE | Freq: Once | ORAL | Status: AC
  Administered 2022-07-08: 50 mg via ORAL
  Filled 2022-07-08: qty 2

## 2022-07-08 MED ORDER — HEPARIN SOD (PORK) LOCK FLUSH 100 UNIT/ML IV SOLN
500.0000 [IU] | Freq: Once | INTRAVENOUS | Status: AC | PRN
  Administered 2022-07-08: 500 [IU]

## 2022-07-08 NOTE — Progress Notes (Signed)
Luis Stewart, [place] [id number]   CLINIC:  Medical Oncology/Hematology  PCP:  Lindell Spar, MD [address] / [place] [address] [phone]   REASON FOR VISIT:  Follow-up for prostate cancer and multiple myeloma  PRIOR THERAPY: none  NGS Results: not done  CURRENT THERAPY: Carfilzomib (20/70) + Daratumumab SQ + Dexamethasone (20/40) DaraKRd q28d  BRIEF ONCOLOGIC HISTORY:  Oncology History  Multiple myeloma ([place])  04/09/2022 Initial Diagnosis   Multiple myeloma ([place])   04/15/2022 -  Chemotherapy   Patient is on Treatment Plan : MYELOMA RELAPSED/REFRACTORY Carfilzomib (20/70) + Daratumumab SQ + Dexamethasone (20/40) DaraKd q28d       CANCER STAGING:  Cancer Staging  Multiple myeloma ([place]) Staging form: Plasma Cell Myeloma and Plasma Cell Disorders, AJCC 8th Edition - Clinical: No stage assigned - Unsigned   INTERVAL HISTORY:  Luis Stewart, a 71 y.o. male, returns for routine follow-up and consideration for next cycle of chemotherapy. Luis Stewart was last seen on 06/10/2022.  Due for cycle #4 of carfilzomib and Darzalex Faspro today.   Overall, Luis Stewart tells me Luis Stewart has been feeling pretty well. Luis Stewart reports altered taste, but Luis Stewart continues to eat well. His numbness in his hands and feet are stable. Luis Stewart restarts Revlimid tonight. Luis Stewart has gained 4 lbs since his last visit. Luis Stewart denies history of dental issues, and Luis Stewart regularly sees his dentist. Luis Stewart denies orthopnea and SOB. Luis Stewart reports occasional mild hot flashes.   Overall, Luis Stewart feels ready for next cycle of chemo today.    REVIEW OF SYSTEMS:  Review of Systems  Constitutional:  Negative for appetite change, fatigue and unexpected weight change (+4 lbs).  Respiratory:  Positive for cough (allergies). Negative for shortness of breath.   Cardiovascular:  Positive for chest pain (over the weekend).  Gastrointestinal:  Positive for constipation.  Endocrine: Positive for hot  flashes.  Neurological:  Positive for numbness (stable).  All other systems reviewed and are negative.   PAST MEDICAL/SURGICAL HISTORY:  Past Medical History:  Diagnosis Date   Arthritis    Asbestosis ([place])    Closed fracture of distal end of fibula with tibia with routine healing 05/28/2020   COVID-19 09/03/2021   Cyst of kidney, acquired    Headache    History of fracture of leg    Right, childhood   Hx of migraine headaches    Hyperlipidemia    Neuropathy    PE (pulmonary thromboembolism) ([place])    After knee surgery   Prostate cancer ([place]) 2015   Adenocarcinoma by biopsy   Past Surgical History:  Procedure Laterality Date   BIOPSY PROSTATE  2003 and 2005   COLONOSCOPY  11/28/2008   Dr. Rourk:internal hemorrhoids/tortus colon/ascending colon polyps, tubular adenomas   COLONOSCOPY N/A 09/04/2014   Procedure: COLONOSCOPY;  Surgeon: Daneil Dolin, MD;  [facility];  Service: Endoscopy;  Laterality: N/A;  9:30   COLONOSCOPY N/A 07/17/2017   Procedure: COLONOSCOPY;  Surgeon: Danie Binder, MD;  [facility];  Service: Endoscopy;  Laterality: N/A;   ESOPHAGOGASTRODUODENOSCOPY N/A 07/16/2017   Procedure: ESOPHAGOGASTRODUODENOSCOPY (EGD);  Surgeon: Danie Binder, MD;  [facility];  Service: Endoscopy;  Laterality: N/A;   GIVENS CAPSULE STUDY  07/16/2017   Procedure: GIVENS CAPSULE STUDY;  Surgeon: Danie Binder, MD;  [facility];  Service: Endoscopy;;   JOINT REPLACEMENT N/A    Phreesia 01/12/2021   POLYPECTOMY  07/17/2017  Procedure: POLYPECTOMY;  Surgeon: Danie Binder, MD;  [facility];  Service: Endoscopy;;  cecal   PORTACATH PLACEMENT Left 04/18/2022   Procedure: INSERTION PORT-A-CATH;  Surgeon: Aviva Signs, MD;  Location: [facility];  Service: General;  Laterality: Left;   TOTAL KNEE ARTHROPLASTY Right 06/10/2017   TOTAL KNEE ARTHROPLASTY Right 06/10/2017   Procedure: TOTAL KNEE ARTHROPLASTY;  Surgeon: [name], MD;  [facility];  Service: Orthopedics;  Laterality: Right;    SOCIAL HISTORY:  Social History   Socioeconomic History   Marital status: Married    Spouse name: Not on file   Number of children: Not on file   Years of education: Not on file   Highest education level: Not on file  Occupational History   Occupation: Retired  Tobacco Use   Smoking status: Never   Smokeless tobacco: Never  Vaping Use   Vaping Use: Never used  Substance and Sexual Activity   Alcohol use: No   Drug use: No   Sexual activity: Yes  Other Topics Concern   Not on file  Social History Narrative   Not on file   Social Determinants of Health   Financial Resource Strain: Not on file  Food Insecurity: Not on file  Transportation Needs: No Transportation Needs (02/03/2022)   PRAPARE - Hydrologist (Medical): No    Lack of Transportation (Non-Medical): No  Physical Activity: Sufficiently Active (02/03/2022)   Exercise Vital Sign    Days of Exercise per Week: 4 days    Minutes of Exercise per Session: 60 min  Stress: Not on file  Social Connections: Moderately Integrated (02/03/2022)   Social Connection and Isolation Panel [NHANES]    Frequency of Communication with Friends and Family: More than three times a week    Frequency of Social Gatherings with Friends and Family: More than three times a week    Attends Religious Services: More than 4 times per year    Active Member of [employer] or Organizations: No    Attends Archivist Meetings: Never    Marital Status: Married  Human resources officer Violence: Not At Risk (02/03/2022)   Humiliation, Afraid, Rape, and Kick questionnaire    Fear of Current or Ex-Partner: No    Emotionally Abused: No    Physically Abused: No    Sexually Abused: No    FAMILY HISTORY:  Family History  Problem Relation Age of Onset   Diabetes Mother    Hypertension Mother    Obesity Mother    Heart disease Mother    Mental  illness Mother    Hypertension Sister    Obesity Sister    Arthritis Sister    Deep vein thrombosis Father    Dementia Father    Heart disease Father        CABG   Colon cancer Neg Hx     CURRENT MEDICATIONS:  Current Outpatient Medications  Medication Sig Dispense Refill   acetaminophen (TYLENOL) 500 MG tablet Take 1,000 mg by mouth every 6 (six) hours as needed for mild pain or moderate pain.     acyclovir (ZOVIRAX) 400 MG tablet Take 1 tablet (400 mg total) by mouth 2 (two) times daily. 60 tablet 6   aspirin EC 81 MG tablet Take 81 mg by mouth daily. Swallow whole.     benzonatate (TESSALON) 100 MG capsule Take 1 capsule (100 mg total) by mouth 2 (two) times daily as needed for cough. [place]  capsule 0   Carfilzomib (KYPROLIS IV) Inject into the vein once a week.     Cholecalciferol (VITAMIN D) 2000 units CAPS Take 2,000 Units by mouth every other day.     daratumumab-hyaluronidase-fihj (DARZALEX FASPRO) 1800-30000 MG-UT/15ML SOLN Inject 1,800 mg into the skin once a week.     lenalidomide (REVLIMID) 15 MG capsule Take 1 capsule (15 mg total) by mouth daily. Take for 21 days, then hold for 7 days. Repeat every 28 days. 21 capsule 0   lidocaine-prilocaine (EMLA) cream Apply a small amount to port a cath site and cover with plastic wrap (do not rub in) 1 hour prior to infusion appointments 30 g 0   methocarbamol (ROBAXIN) 500 MG tablet Take 1 tablet (500 mg total) by mouth 3 (three) times daily. (Patient taking differently: Take 500 mg by mouth every 8 (eight) hours as needed for muscle spasms.) 60 tablet 1   Multiple Vitamin (MULTIVITAMIN WITH MINERALS) TABS tablet Take 1 tablet by mouth daily with breakfast. Centrum Silver for Men     nitroGLYCERIN (NITROSTAT) 0.4 MG SL tablet Place 1 tablet (0.4 mg total) under the tongue every 5 (five) minutes as needed for chest pain. 30 tablet 0   pantoprazole (PROTONIX) 40 MG tablet Take 1 tablet (40 mg total) by mouth every other day. 90 tablet 0    prochlorperazine (COMPAZINE) 10 MG tablet Take 1 tablet (10 mg total) by mouth every 6 (six) hours as needed for nausea or vomiting. 30 tablet 3   tamsulosin (FLOMAX) 0.4 MG CAPS capsule Take 1 capsule (0.4 mg total) by mouth daily. 30 capsule 11   traMADol (ULTRAM) 50 MG tablet Take 1 tablet (50 mg total) by mouth every 6 (six) hours as needed. 10 tablet 0   No current facility-administered medications for this visit.    ALLERGIES:  Allergies  Allergen Reactions   Sulfa Antibiotics Other (See Comments)    Unknown reaction. Childhood reaction    PHYSICAL EXAM:  Performance status (ECOG): 1 - Symptomatic but completely ambulatory  There were no vitals filed for this visit. Wt Readings from Last 3 Encounters:  06/25/22 181 lb 3.2 oz (82.2 kg)  06/17/22 181 lb 9.6 oz (82.4 kg)  06/10/22 181 lb 3.5 oz (82.2 kg)   Physical Exam Vitals reviewed.  Constitutional:      Appearance: Normal appearance.  Cardiovascular:     Rate and Rhythm: Normal rate and regular rhythm.     Pulses: Normal pulses.     Heart sounds: Normal heart sounds.  Pulmonary:     Effort: Pulmonary effort is normal.     Breath sounds: Normal breath sounds.  Musculoskeletal:     Right lower leg: No edema.     Left lower leg: No edema.  Neurological:     General: No focal deficit present.     Mental Status: Luis Stewart is alert and oriented to person, place, and time.  Psychiatric:        Mood and Affect: Mood normal.        Behavior: Behavior normal.     LABORATORY DATA:  I have reviewed the labs as listed.     Latest Ref Rng & Units 07/01/2022    9:00 AM 06/25/2022    9:15 AM 06/17/2022    8:15 AM  CBC  WBC 4.0 - 10.5 K/uL 3.9  5.1  3.5   Hemoglobin 13.0 - 17.0 g/dL 10.3  11.0  10.7   Hematocrit 39.0 - 52.0 % 30.7  33.0  31.5   Platelets 150 - 400 K/uL 149  204  179       Latest Ref Rng & Units 07/01/2022    9:00 AM 06/25/2022    9:15 AM 06/17/2022    8:15 AM  CMP  Glucose 70 - 99 mg/dL 89  102  87   BUN 8 -  23 mg/dL '20  18  20   ' Creatinine 0.61 - 1.24 mg/dL 1.08  1.09  1.28   Sodium 135 - 145 mmol/L 136  136  141   Potassium 3.5 - 5.1 mmol/L 3.7  3.8  3.8   Chloride 98 - 111 mmol/L 106  103  105   CO2 22 - 32 mmol/L '28  27  28   ' Calcium 8.9 - 10.3 mg/dL 8.4  8.5  8.7   Total Protein 6.5 - 8.1 g/dL 5.8  5.8  6.1   Total Bilirubin 0.3 - 1.2 mg/dL 1.0  0.4  0.8   Alkaline Phos 38 - 126 U/L 68  70  72   AST 15 - 41 U/L 18  26  36   ALT 0 - 44 U/L 27  34  36     DIAGNOSTIC IMAGING:  I have independently reviewed the scans and discussed with the patient. No results found.   ASSESSMENT:  Prostate cancer: - TRUS/Bx on 06/14/2015: 1/12 cores positive for adenocarcinoma-10% of the core in the right lateral base revealed Gleason 3+3=6.  Prostate volume 175 cc.  PSA was 16.6.  PSA density 0.09. - Seen by Dr. Louis Meckel in [facility] for robotic prostatectomy.  Due to low-volume prostate cancer and very low PSA density, it was recommended that Luis Stewart continue active surveillance. - 01/21/2016: Surveillance TRUS/biopsy, following a prostatic MRI which revealed no suspicious prostatic lesions and correlated the patient's large prostate.  All 12 cores were negative for prostate cancer.  Prostate volume was 190 mL. - 08/11/2018: Fusion TRUS/biopsy: Recent prostate MRI-volume 220 mL, no significant lesions.  No evidence of transcapsular/.  Ureteral, SV/bony or lymphatic disease.  Path-1 core (left apex) revealed Gleason 3+3 and 5% of core. - PSA: 17.16 (10/09/2015), 15.4 (11/04/2016), 17.9 (05/15/2017), 18.4 (04/19/2018), 15.9 (02/15/2019), 15.5 (03/20/2020), 22.4 (03/20/2021) - MRI of the lumbar spine with and without contrast on 11/27/2021 done for back pain showed expanded appearance of the sacrum with diffusely abnormal bone marrow signal and contrast-enhancement concerning for metastatic disease. - CT pelvis with contrast on 11/27/2021: Expansile, heterogeneous trabeculated appearance of the sacrum with thickening of the  cortex, unchanged from multiple prior exams.  No evidence of pelvic lymphadenopathy.  Extreme prostatomegaly. -PSMA PET scan (03/13/2022): Markedly enlarged prostate gland with focal activity inferior left aspect of the gland suggesting prostatic adenocarcinoma.  No nodal metastasis in the abdomen or pelvis.  Multifocal skeletal metastasis in the mid sacrum, right sacral ala, subtle lesions in the L2 and several rib lesions. - Degarelix started on 04/15/2022   Social/family history: - Lives at home with his wife.  Retired in 2003 from [employer].  Luis Stewart works part-time work at an [employer].  Non-smoker. - Sister had cholangiocarcinoma.  Paternal first cousin had metastatic cancer.  Paternal uncle had prostate cancer.  3.  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  Core biopsy demonstrates hypercellular marrow (95%) with large atypical plasma cells arranged in sheets, by CD138 IHC plasma cells comprise 70% of the cellular elements,  kappa restricted.  Amyloid was negative. 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  Chronic progressive changes involving the sacrum, left iliac bone including accentuation of trabecular markings and cortical thickening with low FDG uptake suggestive of chronic Paget's disease.  Marked prostate gland enlargement. - BMBX (03/20/2022):Hypercellular bone marrow with at least 25% plasma cells on aspirate with atypical features.  Core biopsy demonstrates hypercellular marrow for age (95%) with large atypical plasma cells arranged in sheets.  By CD138 IHC, plasma cells comprise approximately 70% of cellular elements and are kappa restricted.  Amyloid deposition  was negative by Congo red.  Karyotype showed 20 q. deletion.  Myeloma FISH panel showed complex abnormalities. 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 - PSA improved to 4.84.  2.  IgG kappa multiple myeloma with complex cytogenetics: - Luis Stewart has completed 3 cycles of Dara KRD. - Myeloma labs (07/01/2022): M spike improved to 0.3 g.  Free light chain ratio improved to 1.91. - Luis Stewart has some altered taste.  Weight is stable. - Continue Revlimid 15 mg 3 weeks on/1 week off. - Continue daratumumab and carfilzomib.  Luis Stewart has an appointment to see Dr. Feliciana Rossetti on 07/31/2022. - RTC 4 weeks for follow-up with repeat myeloma labs.  3.  Normocytic anemia: - Hemoglobin is 9.9 today.  This is from myelosuppression.  Closely monitor.  4.  Bone health: - Bone density test (06/04/2022): T score -2.1, osteopenia. - I talked him about initiating him on denosumab monthly.  We talked about side effects including rare chance of ONJ. - Luis Stewart follows up with a dentist every 6 months on a regular basis.  Luis Stewart will have next dental evaluation on 08/28/2022.  We will start him after the dentist visit.   Orders placed this encounter:  No orders of the defined types were placed in this encounter.    [name], MD [facility] [phone]   I, Thana Ates, am acting as a scribe for Dr. [name].  I, [name] MD, have reviewed the above documentation for accuracy and completeness, and I agree with the above.

## 2022-07-08 NOTE — Progress Notes (Signed)
Patient tolerated Daratumumab injection with no complaints voiced.  See MAR for details.  Labs reviewed. Injection site clean and dry with no bruising or swelling noted at site.  Band aid applied.  Vss with discharge and left in satisfactory condition with no s/s of distress noted.    Patient tolerated chemotherapy with no complaints voiced.  Side effects with management reviewed with understanding verbalized.  Port site clean and dry with no bruising or swelling noted at site.  Good blood return noted before and after administration of chemotherapy.  Band aid applied.  Patient left in satisfactory condition with VSS and no s/s of distress noted.

## 2022-07-08 NOTE — Progress Notes (Signed)
Patient is taking Revlimid as prescribed.  He has not missed any doses and reports no side effects at this time other than taste change.

## 2022-07-08 NOTE — Patient Instructions (Signed)
[place] [facility]  Discharge Instructions: Thank you for choosing [facility] to provide your oncology and hematology care.  If you have a lab appointment with the [facility], please come in thru the Main Entrance and check in at the main information desk.  Wear comfortable clothing and clothing appropriate for easy access to any Portacath or PICC line.   We strive to give you quality time with your provider. You may need to reschedule your appointment if you arrive late (15 or more minutes).  Arriving late affects you and other patients whose appointments are after yours.  Also, if you miss three or more appointments without notifying the office, you may be dismissed from the clinic at the provider's discretion.      For prescription refill requests, have your pharmacy contact our office and allow 72 hours for refills to be completed.    Today you received the following chemotherapy and/or immunotherapy agents darzalex, firmagon, kyprolis.  Degarelix injection What is this medication? DEGARELIX (deg a REL ix) is used to treat men with advanced prostate cancer. This medicine may be used for other purposes; ask your health care provider or pharmacist if you have questions. COMMON BRAND NAME(S): Degarelix, [place] Koller What should I tell my care team before I take this medication? They need to know if you have any of these conditions: diabetes heart disease kidney disease liver disease low levels of potassium or magnesium in the blood osteoporosis an unusual or allergic reaction to degarelix, mannitol, other medicines, foods, dyes, or preservatives pregnant or trying to get pregnant breast-feeding How should I use this medication? This medicine is for injection under the skin. It is usually given by a health care professional in a hospital or clinic setting. If you get this medicine at home, you will be taught how to prepare and give this medicine. Use exactly as  directed. Take your medicine at regular intervals. Do not take it more often than directed. It is important that you put your used needles and syringes in a special sharps container. Do not put them in a trash can. If you do not have a sharps container, call your pharmacist or healthcare provider to get one. Talk to your pediatrician regarding the use of this medicine in children. Special care may be needed. Overdosage: If you think you have taken too much of this medicine contact a poison control center or emergency room at once. NOTE: This medicine is only for you. Do not share this medicine with others. What if I miss a dose? Try not to miss a dose. If you do miss a dose, call your doctor or health care professional for advice. What may interact with this medication? Do not take this medicine with any of the following medications: cisapride dronedarone pimozide thioridazine This medicine may also interact with the following medications: other medicines that prolong the QT interval (abnormal heart rhythm) This list may not describe all possible interactions. Give your health care provider a list of all the medicines, herbs, non-prescription drugs, or dietary supplements you use. Also tell them if you smoke, drink alcohol, or use illegal drugs. Some items may interact with your medicine. What should I watch for while using this medication? Visit your doctor or health care professional for regular checks on your progress and discuss any issues before you start taking this medicine. Do not rub or scratch injection site. There may be a lump at the injection site, or it may be red or  sore for a few days after your dose. Your doctor or health care professional will need to monitor your hormone levels in your blood to check your response to treatment. Try to keep any appointments for testing. What side effects may I notice from receiving this medication? Side effects that you should report to your  doctor or health care professional as soon as possible: allergic reactions like skin rash, itching or hives, swelling of the face, lips, or tongue fever or chills irregular heartbeat nausea and vomiting along with severe abdominal pain pain or difficulty passing urine pelvic pain or bloating signs and symptoms of high blood sugar such as being more thirsty or hungry or having to urinate more than normal. You may also feel very tired or have blurry vision Side effects that usually do not require medical attention (report to your doctor or health care professional if they continue or are bothersome): change in sex drive or performance constipation headache high blood pressure hot flashes (flushing of skin, increased sweating) itching, redness or mild pain at site where injected joint pain trouble sleeping unusually weak or tired weight gain This list may not describe all possible side effects. Call your doctor for medical advice about side effects. You may report side effects to FDA at 1-800-FDA-1088. Where should I keep my medication? Keep out of the reach of children. This drug is usually given in a hospital or clinic and will not be stored at home. In rare cases, this medicine may be given at home. If you are using this medicine at home, you will be instructed on how to store this medicine. Throw away any unused medicine after the expiration date on the label. NOTE: This sheet is a summary. It may not cover all possible information. If you have questions about this medicine, talk to your doctor, pharmacist, or health care provider.  2023 Elsevier/Gold Standard (2021-11-08 00:00:00) Carfilzomib injection What is this medication? CARFILZOMIB (kar FILZ oh mib) targets a specific protein within cancer cells and stops the cancer cells from growing. It is used to treat multiple myeloma. This medicine may be used for other purposes; ask your health care provider or pharmacist if you have  questions. COMMON BRAND NAME(S): KYPROLIS What should I tell my care team before I take this medication? They need to know if you have any of these conditions: heart disease history of blood clots irregular heartbeat kidney disease liver disease lung or breathing disease an unusual or allergic reaction to carfilzomib, or other medicines, foods, dyes, or preservatives pregnant or trying to get pregnant breast-feeding How should I use this medication? This medicine is for injection or infusion into a vein. It is given by a health care professional in a hospital or clinic setting. Talk to your pediatrician regarding the use of this medicine in children. Special care may be needed. Overdosage: If you think you have taken too much of this medicine contact a poison control center or emergency room at once. NOTE: This medicine is only for you. Do not share this medicine with others. What if I miss a dose? It is important not to miss your dose. Call your doctor or health care professional if you are unable to keep an appointment. What may interact with this medication? Interactions are not expected. This list may not describe all possible interactions. Give your health care provider a list of all the medicines, herbs, non-prescription drugs, or dietary supplements you use. Also tell them if you smoke, drink alcohol, or use  illegal drugs. Some items may interact with your medicine. What should I watch for while using this medication? Your condition will be monitored while you are receiving this medicine. You may need blood work done while you are taking this medicine. Do not become pregnant while taking this medicine or for 6 months after stopping it. Women should inform their health care provider if they wish to become pregnant or think they might be pregnant. Men should not father a child while taking this medicine and for 3 months after stopping it. There is a potential for serious side effects  to an unborn child. Talk to your health care provider for more information. Do not breast-feed an infant while taking this medicine or for 2 weeks after stopping it. Check with your health care provider if you have severe diarrhea, nausea, and vomiting, or if you sweat a lot. The loss of too much body fluid may make it dangerous for you to take this medicine. You may get drowsy or dizzy. Do not drive, use machinery, or do anything that needs mental alertness until you know how this medicine affects you. Do not stand up or sit up quickly, especially if you are an older patient. This reduces the risk of dizzy or fainting spells. What side effects may I notice from receiving this medication? Side effects that you should report to your doctor or health care professional as soon as possible: allergic reactions like skin rash, itching or hives, swelling of the face, lips, or tongue confusion dizziness feeling faint or lightheaded fever or chills palpitations seizures signs and symptoms of bleeding such as bloody or black, tarry stools; red or dark-brown urine; spitting up blood or brown material that looks like coffee grounds; red spots on the skin; unusual bruising or bleeding including from the eye, gums, or nose signs and symptoms of a blood clot such as breathing problems; changes in vision; chest pain; severe, sudden headache; pain, swelling, warmth in the leg; trouble speaking; sudden numbness or weakness of the face, arm or leg signs and symptoms of kidney injury like trouble passing urine or change in the amount of urine signs and symptoms of liver injury like dark yellow or brown urine; general ill feeling or flu-like symptoms; light-colored stools; loss of appetite; nausea; right upper belly pain; unusually weak or tired; yellowing of the eyes or skin Side effects that usually do not require medical attention (report to your doctor or health care professional if they continue or are  bothersome): back pain cough diarrhea headache muscle cramps trouble sleeping vomiting This list may not describe all possible side effects. Call your doctor for medical advice about side effects. You may report side effects to FDA at 1-800-FDA-1088. Where should I keep my medication? This drug is given in a hospital or clinic and will not be stored at home. NOTE: This sheet is a summary. It may not cover all possible information. If you have questions about this medicine, talk to your doctor, pharmacist, or health care provider.  2023 Elsevier/Gold Standard (2021-01-17 00:00:00) Daratumumab injection What is this medication? DARATUMUMAB (dar a toom ue mab) is a monoclonal antibody. It is used to treat multiple myeloma. This medicine may be used for other purposes; ask your health care provider or pharmacist if you have questions. COMMON BRAND NAME(S): DARZALEX What should I tell my care team before I take this medication? They need to know if you have any of these conditions: hereditary fructose intolerance infection (especially a virus infection  such as chickenpox, herpes, or hepatitis B virus) lung or breathing disease (asthma, COPD) an unusual or allergic reaction to daratumumab, sorbitol, other medicines, foods, dyes, or preservatives pregnant or trying to get pregnant breast-feeding How should I use this medication? This medicine is for infusion into a vein. It is given by a health care professional in a hospital or clinic setting. Talk to your pediatrician regarding the use of this medicine in children. Special care may be needed. Overdosage: If you think you have taken too much of this medicine contact a poison control center or emergency room at once. NOTE: This medicine is only for you. Do not share this medicine with others. What if I miss a dose? Keep appointments for follow-up doses as directed. It is important not to miss your dose. Call your doctor or health care  professional if you are unable to keep an appointment. What may interact with this medication? Interactions have not been studied. This list may not describe all possible interactions. Give your health care provider a list of all the medicines, herbs, non-prescription drugs, or dietary supplements you use. Also tell them if you smoke, drink alcohol, or use illegal drugs. Some items may interact with your medicine. What should I watch for while using this medication? Your condition will be monitored carefully while you are receiving this medicine. This medicine can cause serious allergic reactions. To reduce your risk, your health care provider may give you other medicine to take before receiving this one. Be sure to follow the directions from your health care provider. This medicine can affect the results of blood tests to match your blood type. These changes can last for up to 6 months after the final dose. Your healthcare provider will do blood tests to match your blood type before you start treatment. Tell all of your healthcare providers that you are being treated with this medicine before receiving a blood transfusion. This medicine can affect the results of some tests used to determine treatment response; extra tests may be needed to evaluate response. Do not become pregnant while taking this medicine or for 3 months after stopping it. Women should inform their health care provider if they wish to become pregnant or think they might be pregnant. There is a potential for serious side effects to an unborn child. Talk to your health care provider for more information. Do not breast-feed an infant while taking this medicine. What side effects may I notice from receiving this medication? Side effects that you should report to your care team as soon as possible: Allergic reactions--skin rash, itching or hives, swelling of the face, lips, or tongue Blurred vision Infection--fever, chills, cough, sore  throat, pain or trouble passing urine Infusion reactions--dizziness, fast heartbeat, feeling faint or lightheaded, falls, headache, increase in blood pressure, nausea, vomiting, or wheezing or trouble breathing with loud or whistling sounds Unusual bleeding or bruising Side effects that usually do not require medical attention (report to your care team if they continue or are bothersome): Constipation Diarrhea Pain, tingling, numbness in the hands or feet Swelling of the ankles, feet, hands Tiredness This list may not describe all possible side effects. Call your doctor for medical advice about side effects. You may report side effects to FDA at 1-800-FDA-1088. Where should I keep my medication? This drug is given in a hospital or clinic and will not be stored at home. NOTE: This sheet is a summary. It may not cover all possible information. If you have questions about  this medicine, talk to your doctor, pharmacist, or health care provider.  2023 Elsevier/Gold Standard (2021-05-16 00:00:00)       To help prevent nausea and vomiting after your treatment, we encourage you to take your nausea medication as directed.  BELOW ARE SYMPTOMS THAT SHOULD BE REPORTED IMMEDIATELY: *FEVER GREATER THAN 100.4 F (38 C) OR HIGHER *CHILLS OR SWEATING *NAUSEA AND VOMITING THAT IS NOT CONTROLLED WITH YOUR NAUSEA MEDICATION *UNUSUAL SHORTNESS OF BREATH *UNUSUAL BRUISING OR BLEEDING *URINARY PROBLEMS (pain or burning when urinating, or frequent urination) *BOWEL PROBLEMS (unusual diarrhea, constipation, pain near the anus) TENDERNESS IN MOUTH AND THROAT WITH OR WITHOUT PRESENCE OF ULCERS (sore throat, sores in mouth, or a toothache) UNUSUAL RASH, SWELLING OR PAIN  UNUSUAL VAGINAL DISCHARGE OR ITCHING   Items with * indicate a potential emergency and should be followed up as soon as possible or go to the Emergency Department if any problems should occur.  Please show the CHEMOTHERAPY ALERT CARD or  IMMUNOTHERAPY ALERT CARD at check-in to the Emergency Department and triage nurse.  Should you have questions after your visit or need to cancel or reschedule your appointment, please contact [facility] [phone]  and follow the prompts.  Office hours are 8:00 a.m. to 4:30 p.m. Monday - Friday. Please note that voicemails left after 4:00 p.m. may not be returned until the following business day.  We are closed weekends and major holidays. You have access to a nurse at all times for urgent questions. Please call the main number to the clinic [phone] and follow the prompts.  For any non-urgent questions, you may also contact your provider using MyChart. We now offer e-Visits for anyone 54 and older to request care online for non-urgent symptoms. For details visit [email or web address].   Also download the MyChart app! Go to the app store, search "MyChart", open the app, select [facility], and log in with your MyChart username and password.  Masks are optional in the cancer centers. If you would like for your care team to wear a mask while they are taking care of you, please let them know. For doctor visits, patients may have with them one support person who is at least 72 years old. At this time, visitors are not allowed in the infusion area.

## 2022-07-08 NOTE — Patient Instructions (Signed)
[place] at [facility] Discharge Instructions   You were seen and examined today by Dr. Delton Coombes.  He reviewed your lab work which shows your myeloma labs have improved.   We will proceed with your treatment today. We will also plan to add a shot called Xgeva that we will give once a month to help strengthen your bones.  Return as scheduled.    Thank you for choosing [place] at [facility] to provide your oncology and hematology care.  To afford each patient quality time with our provider, please arrive at least 15 minutes before your scheduled appointment time.   If you have a lab appointment with the [facility] please come in thru the Main Entrance and check in at the main information desk.  You need to re-schedule your appointment should you arrive 10 or more minutes late.  We strive to give you quality time with our providers, and arriving late affects you and other patients whose appointments are after yours.  Also, if you no show three or more times for appointments you may be dismissed from the clinic at the providers discretion.     Again, thank you for choosing [facility].  Our hope is that these requests will decrease the amount of time that you wait before being seen by our physicians.       _____________________________________________________________  Should you have questions after your visit to [facility], please contact our office at [phone] and follow the prompts.  Our office hours are 8:00 a.m. and 4:30 p.m. Monday - Friday.  Please note that voicemails left after 4:00 p.m. may not be returned until the following business day.  We are closed weekends and major holidays.  You do have access to a nurse 24-7, just call the main number to the clinic [phone] and do not press any options, hold on the line and a nurse will answer the phone.    For prescription refill requests, have your  pharmacy contact our office and allow 72 hours.    Due to Covid, you will need to wear a mask upon entering the hospital. If you do not have a mask, a mask will be given to you at the Main Entrance upon arrival. For doctor visits, patients may have 1 support person age 70 or older with them. For treatment visits, patients can not have anyone with them due to social distancing guidelines and our immunocompromised population.

## 2022-07-11 ENCOUNTER — Encounter (HOSPITAL_COMMUNITY): Payer: Self-pay | Admitting: Hematology

## 2022-07-14 ENCOUNTER — Other Ambulatory Visit: Payer: Self-pay

## 2022-07-15 ENCOUNTER — Inpatient Hospital Stay (HOSPITAL_COMMUNITY): Payer: Medicare HMO

## 2022-07-15 VITALS — BP 126/70 | HR 52 | Temp 97.3°F | Resp 17

## 2022-07-15 DIAGNOSIS — C9000 Multiple myeloma not having achieved remission: Secondary | ICD-10-CM | POA: Diagnosis not present

## 2022-07-15 DIAGNOSIS — Z79899 Other long term (current) drug therapy: Secondary | ICD-10-CM | POA: Diagnosis not present

## 2022-07-15 DIAGNOSIS — C61 Malignant neoplasm of prostate: Secondary | ICD-10-CM | POA: Diagnosis not present

## 2022-07-15 DIAGNOSIS — Z5112 Encounter for antineoplastic immunotherapy: Secondary | ICD-10-CM | POA: Diagnosis not present

## 2022-07-15 DIAGNOSIS — C7951 Secondary malignant neoplasm of bone: Secondary | ICD-10-CM | POA: Diagnosis not present

## 2022-07-15 LAB — CBC WITH DIFFERENTIAL/PLATELET
Abs Immature Granulocytes: 0.01 10*3/uL (ref 0.00–0.07)
Basophils Absolute: 0 10*3/uL (ref 0.0–0.1)
Basophils Relative: 1 %
Eosinophils Absolute: 0.6 10*3/uL — ABNORMAL HIGH (ref 0.0–0.5)
Eosinophils Relative: 21 %
HCT: 31.4 % — ABNORMAL LOW (ref 39.0–52.0)
Hemoglobin: 10.4 g/dL — ABNORMAL LOW (ref 13.0–17.0)
Immature Granulocytes: 0 %
Lymphocytes Relative: 19 %
Lymphs Abs: 0.6 10*3/uL — ABNORMAL LOW (ref 0.7–4.0)
MCH: 30.9 pg (ref 26.0–34.0)
MCHC: 33.1 g/dL (ref 30.0–36.0)
MCV: 93.2 fL (ref 80.0–100.0)
Monocytes Absolute: 0.2 10*3/uL (ref 0.1–1.0)
Monocytes Relative: 6 %
Neutro Abs: 1.6 10*3/uL — ABNORMAL LOW (ref 1.7–7.7)
Neutrophils Relative %: 53 %
Platelets: 149 10*3/uL — ABNORMAL LOW (ref 150–400)
RBC: 3.37 MIL/uL — ABNORMAL LOW (ref 4.22–5.81)
RDW: 14.6 % (ref 11.5–15.5)
WBC: 3 10*3/uL — ABNORMAL LOW (ref 4.0–10.5)
nRBC: 0 % (ref 0.0–0.2)

## 2022-07-15 LAB — MAGNESIUM: Magnesium: 2 mg/dL (ref 1.7–2.4)

## 2022-07-15 LAB — COMPREHENSIVE METABOLIC PANEL
ALT: 25 U/L (ref 0–44)
AST: 21 U/L (ref 15–41)
Albumin: 3.5 g/dL (ref 3.5–5.0)
Alkaline Phosphatase: 66 U/L (ref 38–126)
Anion gap: 6 (ref 5–15)
BUN: 22 mg/dL (ref 8–23)
CO2: 27 mmol/L (ref 22–32)
Calcium: 9 mg/dL (ref 8.9–10.3)
Chloride: 106 mmol/L (ref 98–111)
Creatinine, Ser: 1.22 mg/dL (ref 0.61–1.24)
GFR, Estimated: >60 mL/min (ref >60)
Glucose, Bld: 90 mg/dL (ref 70–99)
Potassium: 3.8 mmol/L (ref 3.5–5.1)
Sodium: 139 mmol/L (ref 135–145)
Total Bilirubin: 0.7 mg/dL (ref 0.3–1.2)
Total Protein: 6 g/dL — ABNORMAL LOW (ref 6.5–8.1)

## 2022-07-15 MED ORDER — HEPARIN SOD (PORK) LOCK FLUSH 100 UNIT/ML IV SOLN
500.0000 [IU] | Freq: Once | INTRAVENOUS | Status: AC | PRN
  Administered 2022-07-15: 500 [IU]

## 2022-07-15 MED ORDER — SODIUM CHLORIDE 0.9 % IV SOLN
20.0000 mg | Freq: Once | INTRAVENOUS | Status: AC
  Administered 2022-07-15: 20 mg via INTRAVENOUS
  Filled 2022-07-15: qty 20

## 2022-07-15 MED ORDER — SODIUM CHLORIDE 0.9 % IV SOLN: Freq: Once | INTRAVENOUS | Status: AC

## 2022-07-15 MED ORDER — SODIUM CHLORIDE 0.9% FLUSH
10.0000 mL | INTRAVENOUS | Status: DC | PRN
  Administered 2022-07-15: 10 mL

## 2022-07-15 MED ORDER — DEXTROSE 5 % IV SOLN
56.0000 mg/m2 | Freq: Once | INTRAVENOUS | Status: AC
  Administered 2022-07-15: 110 mg via INTRAVENOUS
  Filled 2022-07-15: qty 30

## 2022-07-15 NOTE — Progress Notes (Signed)
Patient presents today for Kyprolis infusion per providers order.  Vital signs within parameters for treatment.  Labs pending.  Patient has no new complaints at this time.  Kyprolis given today per MD orders.  Stable during infusion without adverse affects.  Vital signs stable.  No complaints at this time.  Discharge from clinic ambulatory in stable condition.  Alert and oriented X 3.  Follow up with [facility] as scheduled.

## 2022-07-15 NOTE — Patient Instructions (Signed)
[facility]  Discharge Instructions: Thank you for choosing [facility] to provide your oncology and hematology care.  If you have a lab appointment with the [facility], please come in thru the Main Entrance and check in at the main information desk.  Wear comfortable clothing and clothing appropriate for easy access to any Portacath or PICC line.   We strive to give you quality time with your provider. You may need to reschedule your appointment if you arrive late (15 or more minutes).  Arriving late affects you and other patients whose appointments are after yours.  Also, if you miss three or more appointments without notifying the office, you may be dismissed from the clinic at the provider's discretion.      For prescription refill requests, have your pharmacy contact our office and allow 72 hours for refills to be completed.    Today you received the following chemotherapy and/or immunotherapy agents Kyprolis      To help prevent nausea and vomiting after your treatment, we encourage you to take your nausea medication as directed.  BELOW ARE SYMPTOMS THAT SHOULD BE REPORTED IMMEDIATELY: *FEVER GREATER THAN 100.4 F (38 C) OR HIGHER *CHILLS OR SWEATING *NAUSEA AND VOMITING THAT IS NOT CONTROLLED WITH YOUR NAUSEA MEDICATION *UNUSUAL SHORTNESS OF BREATH *UNUSUAL BRUISING OR BLEEDING *URINARY PROBLEMS (pain or burning when urinating, or frequent urination) *BOWEL PROBLEMS (unusual diarrhea, constipation, pain near the anus) TENDERNESS IN MOUTH AND THROAT WITH OR WITHOUT PRESENCE OF ULCERS (sore throat, sores in mouth, or a toothache) UNUSUAL RASH, SWELLING OR PAIN  UNUSUAL VAGINAL DISCHARGE OR ITCHING   Items with * indicate a potential emergency and should be followed up as soon as possible or go to the Emergency Department if any problems should occur.  Please show the CHEMOTHERAPY ALERT CARD or IMMUNOTHERAPY ALERT CARD at check-in to the Emergency  Department and triage nurse.  Should you have questions after your visit or need to cancel or reschedule your appointment, please contact [facility] [phone]  and follow the prompts.  Office hours are 8:00 a.m. to 4:30 p.m. Monday - Friday. Please note that voicemails left after 4:00 p.m. may not be returned until the following business day.  We are closed weekends and major holidays. You have access to a nurse at all times for urgent questions. Please call the main number to the clinic [phone] and follow the prompts.  For any non-urgent questions, you may also contact your provider using MyChart. We now offer e-Visits for anyone 52 and older to request care online for non-urgent symptoms. For details visit [email or web address].   Also download the MyChart app! Go to the app store, search "MyChart", open the app, select [facility], and log in with your MyChart username and password.  Masks are optional in the cancer centers. If you would like for your care team to wear a mask while they are taking care of you, please let them know. For doctor visits, patients may have with them one support person who is at least 72 years old. At this time, visitors are not allowed in the infusion area.

## 2022-07-22 ENCOUNTER — Inpatient Hospital Stay: Payer: Medicare HMO | Attending: Hematology

## 2022-07-22 ENCOUNTER — Inpatient Hospital Stay: Payer: Medicare HMO

## 2022-07-22 VITALS — BP 122/70 | HR 55 | Temp 97.8°F | Resp 18 | Ht 73.43 in | Wt 180.0 lb

## 2022-07-22 DIAGNOSIS — Z5111 Encounter for antineoplastic chemotherapy: Secondary | ICD-10-CM | POA: Diagnosis not present

## 2022-07-22 DIAGNOSIS — C9000 Multiple myeloma not having achieved remission: Secondary | ICD-10-CM | POA: Insufficient documentation

## 2022-07-22 DIAGNOSIS — Z79899 Other long term (current) drug therapy: Secondary | ICD-10-CM | POA: Insufficient documentation

## 2022-07-22 DIAGNOSIS — C61 Malignant neoplasm of prostate: Secondary | ICD-10-CM | POA: Insufficient documentation

## 2022-07-22 DIAGNOSIS — Z5112 Encounter for antineoplastic immunotherapy: Secondary | ICD-10-CM | POA: Diagnosis present

## 2022-07-22 LAB — COMPREHENSIVE METABOLIC PANEL
ALT: 25 U/L (ref 0–44)
AST: 18 U/L (ref 15–41)
Albumin: 3.4 g/dL — ABNORMAL LOW (ref 3.5–5.0)
Alkaline Phosphatase: 60 U/L (ref 38–126)
Anion gap: 6 (ref 5–15)
BUN: 17 mg/dL (ref 8–23)
CO2: 26 mmol/L (ref 22–32)
Calcium: 8.6 mg/dL — ABNORMAL LOW (ref 8.9–10.3)
Chloride: 107 mmol/L (ref 98–111)
Creatinine, Ser: 1.09 mg/dL (ref 0.61–1.24)
GFR, Estimated: >60 mL/min (ref >60)
Glucose, Bld: 88 mg/dL (ref 70–99)
Potassium: 3.5 mmol/L (ref 3.5–5.1)
Sodium: 139 mmol/L (ref 135–145)
Total Bilirubin: 0.6 mg/dL (ref 0.3–1.2)
Total Protein: 5.7 g/dL — ABNORMAL LOW (ref 6.5–8.1)

## 2022-07-22 LAB — CBC WITH DIFFERENTIAL/PLATELET
Abs Immature Granulocytes: 0 10*3/uL (ref 0.00–0.07)
Basophils Absolute: 0 10*3/uL (ref 0.0–0.1)
Basophils Relative: 1 %
Eosinophils Absolute: 0.7 10*3/uL — ABNORMAL HIGH (ref 0.0–0.5)
Eosinophils Relative: 19 %
HCT: 30.8 % — ABNORMAL LOW (ref 39.0–52.0)
Hemoglobin: 10.5 g/dL — ABNORMAL LOW (ref 13.0–17.0)
Immature Granulocytes: 0 %
Lymphocytes Relative: 17 %
Lymphs Abs: 0.7 10*3/uL (ref 0.7–4.0)
MCH: 31.3 pg (ref 26.0–34.0)
MCHC: 34.1 g/dL (ref 30.0–36.0)
MCV: 91.7 fL (ref 80.0–100.0)
Monocytes Absolute: 0.4 10*3/uL (ref 0.1–1.0)
Monocytes Relative: 11 %
Neutro Abs: 2 10*3/uL (ref 1.7–7.7)
Neutrophils Relative %: 52 %
Platelets: 171 10*3/uL (ref 150–400)
RBC: 3.36 MIL/uL — ABNORMAL LOW (ref 4.22–5.81)
RDW: 14.4 % (ref 11.5–15.5)
WBC: 3.9 10*3/uL — ABNORMAL LOW (ref 4.0–10.5)
nRBC: 0 % (ref 0.0–0.2)

## 2022-07-22 LAB — MAGNESIUM: Magnesium: 1.9 mg/dL (ref 1.7–2.4)

## 2022-07-22 MED ORDER — SODIUM CHLORIDE 0.9 % IV SOLN: Freq: Once | INTRAVENOUS | Status: AC

## 2022-07-22 MED ORDER — ACETAMINOPHEN 325 MG PO TABS
650.0000 mg | ORAL_TABLET | Freq: Once | ORAL | Status: AC
  Administered 2022-07-22: 650 mg via ORAL
  Filled 2022-07-22: qty 2

## 2022-07-22 MED ORDER — SODIUM CHLORIDE 0.9 % IV SOLN
20.0000 mg | Freq: Once | INTRAVENOUS | Status: AC
  Administered 2022-07-22: 20 mg via INTRAVENOUS
  Filled 2022-07-22: qty 2

## 2022-07-22 MED ORDER — DEXTROSE 5 % IV SOLN
56.0000 mg/m2 | Freq: Once | INTRAVENOUS | Status: AC
  Administered 2022-07-22: 110 mg via INTRAVENOUS
  Filled 2022-07-22: qty 10

## 2022-07-22 MED ORDER — DIPHENHYDRAMINE HCL 25 MG PO CAPS
50.0000 mg | ORAL_CAPSULE | Freq: Once | ORAL | Status: AC
  Administered 2022-07-22: 50 mg via ORAL
  Filled 2022-07-22: qty 2

## 2022-07-22 MED ORDER — SODIUM CHLORIDE 0.9% FLUSH
10.0000 mL | INTRAVENOUS | Status: DC | PRN
  Administered 2022-07-22: 10 mL

## 2022-07-22 MED ORDER — HEPARIN SOD (PORK) LOCK FLUSH 100 UNIT/ML IV SOLN
500.0000 [IU] | Freq: Once | INTRAVENOUS | Status: AC | PRN
  Administered 2022-07-22: 500 [IU]

## 2022-07-22 MED ORDER — DARATUMUMAB-HYALURONIDASE-FIHJ 1800-30000 MG-UT/15ML ~~LOC~~ SOLN
1800.0000 mg | Freq: Once | SUBCUTANEOUS | Status: AC
  Administered 2022-07-22: 1800 mg via SUBCUTANEOUS
  Filled 2022-07-22: qty 15

## 2022-07-22 NOTE — Patient Instructions (Signed)
Luis Stewart  Discharge Instructions: Thank you for choosing [facility] to provide your oncology and hematology care.  If you have a lab appointment with the [facility], please come in thru the Main Entrance and check in at the main information desk.  Wear comfortable clothing and clothing appropriate for easy access to any Portacath or PICC line.   We strive to give you quality time with your provider. You may need to reschedule your appointment if you arrive late (15 or more minutes).  Arriving late affects you and other patients whose appointments are after yours.  Also, if you miss three or more appointments without notifying the office, you may be dismissed from the clinic at the provider's discretion.      For prescription refill requests, have your pharmacy contact our office and allow 72 hours for refills to be completed.    Today you received the following chemotherapy and/or immunotherapy agents Kyprolis daratumumab      To help prevent nausea and vomiting after your treatment, we encourage you to take your nausea medication as directed.  BELOW ARE SYMPTOMS THAT SHOULD BE REPORTED IMMEDIATELY: *FEVER GREATER THAN 100.4 F (38 C) OR HIGHER *CHILLS OR SWEATING *NAUSEA AND VOMITING THAT IS NOT CONTROLLED WITH YOUR NAUSEA MEDICATION *UNUSUAL SHORTNESS OF BREATH *UNUSUAL BRUISING OR BLEEDING *URINARY PROBLEMS (pain or burning when urinating, or frequent urination) *BOWEL PROBLEMS (unusual diarrhea, constipation, pain near the anus) TENDERNESS IN MOUTH AND THROAT WITH OR WITHOUT PRESENCE OF ULCERS (sore throat, sores in mouth, or a toothache) UNUSUAL RASH, SWELLING OR PAIN  UNUSUAL VAGINAL DISCHARGE OR ITCHING   Items with * indicate a potential emergency and should be followed up as soon as possible or go to the Emergency Department if any problems should occur.  Please show the CHEMOTHERAPY ALERT CARD or IMMUNOTHERAPY ALERT CARD at check-in to  the Emergency Department and triage nurse.  Should you have questions after your visit or need to cancel or reschedule your appointment, please contact [facility] [phone]  and follow the prompts.  Office hours are 8:00 a.m. to 4:30 p.m. Monday - Friday. Please note that voicemails left after 4:00 p.m. may not be returned until the following business day.  We are closed weekends and major holidays. You have access to a nurse at all times for urgent questions. Please call the main number to the clinic [phone] and follow the prompts.  For any non-urgent questions, you may also contact your provider using MyChart. We now offer e-Visits for anyone 60 and older to request care online for non-urgent symptoms. For details visit [email or web address].   Also download the MyChart app! Go to the app store, search "MyChart", open the app, select [facility], and log in with your MyChart username and password.  Masks are optional in the cancer centers. If you would like for your care team to wear a mask while they are taking care of you, please let them know. For doctor visits, patients may have with them one support person who is at least 72 years old. At this time, visitors are not allowed in the infusion area.

## 2022-07-22 NOTE — Progress Notes (Signed)
Patient presents today for Kyprolis and Daratumumab per providers order.  Vital signs and labs within parameters for treatment.  Patient has no new complaints at this time.  Kyprolis and Daratumumab given today per MD orders.  Tolerated infusion without adverse affects.  Daratumumab administration without incident; injection site WNL; see MAR for injection details.  Vital signs stable.  No complaints at this time.  Discharge from clinic ambulatory in stable condition.  Alert and oriented X 3.  Follow up with [facility] as scheduled.

## 2022-07-25 ENCOUNTER — Other Ambulatory Visit: Payer: Self-pay

## 2022-07-25 DIAGNOSIS — C9000 Multiple myeloma not having achieved remission: Secondary | ICD-10-CM

## 2022-07-25 MED ORDER — LENALIDOMIDE 15 MG PO CAPS: 15.0000 mg | ORAL_CAPSULE | Freq: Every day | ORAL | 0 refills | Status: DC

## 2022-07-25 NOTE — Telephone Encounter (Signed)
Chart reviewed. Revlimid refilled per last office note with Dr. Katragadda.  

## 2022-07-28 ENCOUNTER — Other Ambulatory Visit: Payer: Self-pay | Admitting: Internal Medicine

## 2022-07-29 ENCOUNTER — Inpatient Hospital Stay: Payer: Medicare HMO

## 2022-07-29 VITALS — BP 129/67 | HR 59 | Temp 98.4°F | Resp 20

## 2022-07-29 DIAGNOSIS — C61 Malignant neoplasm of prostate: Secondary | ICD-10-CM

## 2022-07-29 DIAGNOSIS — C9000 Multiple myeloma not having achieved remission: Secondary | ICD-10-CM

## 2022-07-29 DIAGNOSIS — Z5112 Encounter for antineoplastic immunotherapy: Secondary | ICD-10-CM | POA: Diagnosis not present

## 2022-07-29 DIAGNOSIS — Z79899 Other long term (current) drug therapy: Secondary | ICD-10-CM | POA: Diagnosis not present

## 2022-07-29 DIAGNOSIS — Z95828 Presence of other vascular implants and grafts: Secondary | ICD-10-CM

## 2022-07-29 DIAGNOSIS — Z5111 Encounter for antineoplastic chemotherapy: Secondary | ICD-10-CM | POA: Diagnosis not present

## 2022-07-29 LAB — COMPREHENSIVE METABOLIC PANEL
ALT: 26 U/L (ref 0–44)
AST: 21 U/L (ref 15–41)
Albumin: 3.3 g/dL — ABNORMAL LOW (ref 3.5–5.0)
Alkaline Phosphatase: 55 U/L (ref 38–126)
Anion gap: 6 (ref 5–15)
BUN: 22 mg/dL (ref 8–23)
CO2: 26 mmol/L (ref 22–32)
Calcium: 8.6 mg/dL — ABNORMAL LOW (ref 8.9–10.3)
Chloride: 109 mmol/L (ref 98–111)
Creatinine, Ser: 1.17 mg/dL (ref 0.61–1.24)
GFR, Estimated: >60 mL/min (ref >60)
Glucose, Bld: 87 mg/dL (ref 70–99)
Potassium: 3.6 mmol/L (ref 3.5–5.1)
Sodium: 141 mmol/L (ref 135–145)
Total Bilirubin: 0.6 mg/dL (ref 0.3–1.2)
Total Protein: 5.6 g/dL — ABNORMAL LOW (ref 6.5–8.1)

## 2022-07-29 LAB — CBC WITH DIFFERENTIAL/PLATELET
Abs Immature Granulocytes: 0.01 10*3/uL (ref 0.00–0.07)
Basophils Absolute: 0 10*3/uL (ref 0.0–0.1)
Basophils Relative: 1 %
Eosinophils Absolute: 0.5 10*3/uL (ref 0.0–0.5)
Eosinophils Relative: 17 %
HCT: 30.7 % — ABNORMAL LOW (ref 39.0–52.0)
Hemoglobin: 10.2 g/dL — ABNORMAL LOW (ref 13.0–17.0)
Immature Granulocytes: 0 %
Lymphocytes Relative: 20 %
Lymphs Abs: 0.6 10*3/uL — ABNORMAL LOW (ref 0.7–4.0)
MCH: 30.7 pg (ref 26.0–34.0)
MCHC: 33.2 g/dL (ref 30.0–36.0)
MCV: 92.5 fL (ref 80.0–100.0)
Monocytes Absolute: 0.3 10*3/uL (ref 0.1–1.0)
Monocytes Relative: 10 %
Neutro Abs: 1.6 10*3/uL — ABNORMAL LOW (ref 1.7–7.7)
Neutrophils Relative %: 52 %
Platelets: 141 10*3/uL — ABNORMAL LOW (ref 150–400)
RBC: 3.32 MIL/uL — ABNORMAL LOW (ref 4.22–5.81)
RDW: 14.5 % (ref 11.5–15.5)
WBC: 3.1 10*3/uL — ABNORMAL LOW (ref 4.0–10.5)
nRBC: 0 % (ref 0.0–0.2)

## 2022-07-29 LAB — MAGNESIUM: Magnesium: 1.9 mg/dL (ref 1.7–2.4)

## 2022-07-29 LAB — LACTATE DEHYDROGENASE: LDH: 124 U/L (ref 98–192)

## 2022-07-29 LAB — PSA: Prostatic Specific Antigen: 7.39 ng/mL — ABNORMAL HIGH (ref 0.00–4.00)

## 2022-07-29 MED ORDER — HEPARIN SOD (PORK) LOCK FLUSH 100 UNIT/ML IV SOLN
500.0000 [IU] | Freq: Once | INTRAVENOUS | Status: AC
  Administered 2022-07-29: 500 [IU] via INTRAVENOUS

## 2022-07-29 MED ORDER — SODIUM CHLORIDE 0.9% FLUSH
10.0000 mL | Freq: Once | INTRAVENOUS | Status: AC
  Administered 2022-07-29: 10 mL via INTRAVENOUS

## 2022-07-29 NOTE — Progress Notes (Signed)
Port flushed with good blood return noted. No bruising or swelling at site. Bandaid applied and patient discharged in satisfactory condition. VVS stable with no signs or symptoms of distressed noted. 

## 2022-07-29 NOTE — Patient Instructions (Signed)
[facility]  Discharge Instructions: Thank you for choosing [facility] to provide your oncology and hematology care.  If you have a lab appointment with the [facility], please come in thru the Main Entrance and check in at the main information desk.  Wear comfortable clothing and clothing appropriate for easy access to any Portacath or PICC line.   We strive to give you quality time with your provider. You may need to reschedule your appointment if you arrive late (15 or more minutes).  Arriving late affects you and other patients whose appointments are after yours.  Also, if you miss three or more appointments without notifying the office, you may be dismissed from the clinic at the provider's discretion.      For prescription refill requests, have your pharmacy contact our office and allow 72 hours for refills to be completed.    Today you received the following port flush with labs, return as scheduled.   To help prevent nausea and vomiting after your treatment, we encourage you to take your nausea medication as directed.  BELOW ARE SYMPTOMS THAT SHOULD BE REPORTED IMMEDIATELY: *FEVER GREATER THAN 100.4 F (38 C) OR HIGHER *CHILLS OR SWEATING *NAUSEA AND VOMITING THAT IS NOT CONTROLLED WITH YOUR NAUSEA MEDICATION *UNUSUAL SHORTNESS OF BREATH *UNUSUAL BRUISING OR BLEEDING *URINARY PROBLEMS (pain or burning when urinating, or frequent urination) *BOWEL PROBLEMS (unusual diarrhea, constipation, pain near the anus) TENDERNESS IN MOUTH AND THROAT WITH OR WITHOUT PRESENCE OF ULCERS (sore throat, sores in mouth, or a toothache) UNUSUAL RASH, SWELLING OR PAIN  UNUSUAL VAGINAL DISCHARGE OR ITCHING   Items with * indicate a potential emergency and should be followed up as soon as possible or go to the Emergency Department if any problems should occur.  Please show the CHEMOTHERAPY ALERT CARD or IMMUNOTHERAPY ALERT CARD at check-in to the Emergency  Department and triage nurse.  Should you have questions after your visit or need to cancel or reschedule your appointment, please contact [facility] [phone]  and follow the prompts.  Office hours are 8:00 a.m. to 4:30 p.m. Monday - Friday. Please note that voicemails left after 4:00 p.m. may not be returned until the following business day.  We are closed weekends and major holidays. You have access to a nurse at all times for urgent questions. Please call the main number to the clinic [phone] and follow the prompts.  For any non-urgent questions, you may also contact your provider using MyChart. We now offer e-Visits for anyone 9 and older to request care online for non-urgent symptoms. For details visit [email or web address].   Also download the MyChart app! Go to the app store, search "MyChart", open the app, select [facility], and log in with your MyChart username and password.  Masks are optional in the cancer centers. If you would like for your care team to wear a mask while they are taking care of you, please let them know. For doctor visits, patients may have with them one support person who is at least 72 years old. At this time, visitors are not allowed in the infusion area.

## 2022-07-30 LAB — KAPPA/LAMBDA LIGHT CHAINS
Kappa free light chain: 4.6 mg/L (ref 3.3–19.4)
Kappa, lambda light chain ratio: 1.7 — ABNORMAL HIGH (ref 0.26–1.65)
Lambda free light chains: 2.7 mg/L — ABNORMAL LOW (ref 5.7–26.3)

## 2022-07-31 DIAGNOSIS — C61 Malignant neoplasm of prostate: Secondary | ICD-10-CM | POA: Diagnosis not present

## 2022-07-31 DIAGNOSIS — Z79899 Other long term (current) drug therapy: Secondary | ICD-10-CM | POA: Diagnosis not present

## 2022-07-31 DIAGNOSIS — C9000 Multiple myeloma not having achieved remission: Secondary | ICD-10-CM | POA: Diagnosis not present

## 2022-07-31 LAB — PROTEIN ELECTROPHORESIS, SERUM
A/G Ratio: 1.6 (ref 0.7–1.7)
Albumin ELP: 3.1 g/dL (ref 2.9–4.4)
Alpha-1-Globulin: 0.3 g/dL (ref 0.0–0.4)
Alpha-2-Globulin: 0.5 g/dL (ref 0.4–1.0)
Beta Globulin: 0.8 g/dL (ref 0.7–1.3)
Gamma Globulin: 0.4 g/dL (ref 0.4–1.8)
Globulin, Total: 2 g/dL — ABNORMAL LOW (ref 2.2–3.9)
M-Spike, %: 0.1 g/dL — ABNORMAL HIGH
Total Protein ELP: 5.1 g/dL — ABNORMAL LOW (ref 6.0–8.5)

## 2022-08-05 ENCOUNTER — Encounter: Payer: Self-pay | Admitting: Internal Medicine

## 2022-08-05 ENCOUNTER — Inpatient Hospital Stay: Payer: Medicare HMO

## 2022-08-05 ENCOUNTER — Ambulatory Visit (INDEPENDENT_AMBULATORY_CARE_PROVIDER_SITE_OTHER): Payer: Medicare HMO | Admitting: Internal Medicine

## 2022-08-05 ENCOUNTER — Inpatient Hospital Stay (HOSPITAL_BASED_OUTPATIENT_CLINIC_OR_DEPARTMENT_OTHER): Payer: Medicare HMO | Admitting: Hematology

## 2022-08-05 VITALS — BP 132/82 | HR 60 | Resp 18 | Ht 74.0 in | Wt 183.8 lb

## 2022-08-05 VITALS — BP 102/51 | HR 50 | Temp 96.8°F | Resp 20 | Ht 73.43 in | Wt 184.4 lb

## 2022-08-05 DIAGNOSIS — Z79899 Other long term (current) drug therapy: Secondary | ICD-10-CM | POA: Diagnosis not present

## 2022-08-05 DIAGNOSIS — C9000 Multiple myeloma not having achieved remission: Secondary | ICD-10-CM

## 2022-08-05 DIAGNOSIS — K219 Gastro-esophageal reflux disease without esophagitis: Secondary | ICD-10-CM

## 2022-08-05 DIAGNOSIS — C61 Malignant neoplasm of prostate: Secondary | ICD-10-CM

## 2022-08-05 DIAGNOSIS — Z5112 Encounter for antineoplastic immunotherapy: Secondary | ICD-10-CM | POA: Diagnosis not present

## 2022-08-05 DIAGNOSIS — J0110 Acute frontal sinusitis, unspecified: Secondary | ICD-10-CM | POA: Diagnosis not present

## 2022-08-05 DIAGNOSIS — Z5111 Encounter for antineoplastic chemotherapy: Secondary | ICD-10-CM | POA: Diagnosis not present

## 2022-08-05 LAB — CBC WITH DIFFERENTIAL/PLATELET
Abs Immature Granulocytes: 0 10*3/uL (ref 0.00–0.07)
Basophils Absolute: 0 10*3/uL (ref 0.0–0.1)
Basophils Relative: 0 %
Eosinophils Absolute: 0.1 10*3/uL (ref 0.0–0.5)
Eosinophils Relative: 4 %
HCT: 31.5 % — ABNORMAL LOW (ref 39.0–52.0)
Hemoglobin: 10.6 g/dL — ABNORMAL LOW (ref 13.0–17.0)
Lymphocytes Relative: 29 %
Lymphs Abs: 0.8 10*3/uL (ref 0.7–4.0)
MCH: 31.3 pg (ref 26.0–34.0)
MCHC: 33.7 g/dL (ref 30.0–36.0)
MCV: 92.9 fL (ref 80.0–100.0)
Monocytes Absolute: 0.2 10*3/uL (ref 0.1–1.0)
Monocytes Relative: 7 %
Neutro Abs: 1.7 10*3/uL (ref 1.7–7.7)
Neutrophils Relative %: 60 %
Platelets: 270 10*3/uL (ref 150–400)
RBC: 3.39 MIL/uL — ABNORMAL LOW (ref 4.22–5.81)
RDW: 15 % (ref 11.5–15.5)
WBC: 2.9 10*3/uL — ABNORMAL LOW (ref 4.0–10.5)
nRBC: 0 % (ref 0.0–0.2)

## 2022-08-05 LAB — COMPREHENSIVE METABOLIC PANEL
ALT: 25 U/L (ref 0–44)
AST: 21 U/L (ref 15–41)
Albumin: 3.6 g/dL (ref 3.5–5.0)
Alkaline Phosphatase: 62 U/L (ref 38–126)
Anion gap: 5 (ref 5–15)
BUN: 22 mg/dL (ref 8–23)
CO2: 26 mmol/L (ref 22–32)
Calcium: 9.1 mg/dL (ref 8.9–10.3)
Chloride: 112 mmol/L — ABNORMAL HIGH (ref 98–111)
Creatinine, Ser: 1.14 mg/dL (ref 0.61–1.24)
GFR, Estimated: >60 mL/min (ref >60)
Glucose, Bld: 89 mg/dL (ref 70–99)
Potassium: 3.9 mmol/L (ref 3.5–5.1)
Sodium: 143 mmol/L (ref 135–145)
Total Bilirubin: 0.4 mg/dL (ref 0.3–1.2)
Total Protein: 6 g/dL — ABNORMAL LOW (ref 6.5–8.1)

## 2022-08-05 LAB — MAGNESIUM: Magnesium: 2 mg/dL (ref 1.7–2.4)

## 2022-08-05 MED ORDER — SODIUM CHLORIDE 0.9 % IV SOLN: Freq: Once | INTRAVENOUS | Status: AC

## 2022-08-05 MED ORDER — ACETAMINOPHEN 325 MG PO TABS
650.0000 mg | ORAL_TABLET | Freq: Once | ORAL | Status: AC
  Administered 2022-08-05: 650 mg via ORAL

## 2022-08-05 MED ORDER — DIPHENHYDRAMINE HCL 25 MG PO CAPS
50.0000 mg | ORAL_CAPSULE | Freq: Once | ORAL | Status: AC
  Administered 2022-08-05: 50 mg via ORAL

## 2022-08-05 MED ORDER — DEXTROSE 5 % IV SOLN
56.0000 mg/m2 | Freq: Once | INTRAVENOUS | Status: AC
  Administered 2022-08-05: 110 mg via INTRAVENOUS
  Filled 2022-08-05: qty 15

## 2022-08-05 MED ORDER — AZITHROMYCIN 250 MG PO TABS: ORAL_TABLET | ORAL | 0 refills | Status: AC

## 2022-08-05 MED ORDER — ACETAMINOPHEN 325 MG PO TABS
ORAL_TABLET | ORAL | Status: AC
  Filled 2022-08-05: qty 2

## 2022-08-05 MED ORDER — SODIUM CHLORIDE 0.9 % IV SOLN
20.0000 mg | Freq: Once | INTRAVENOUS | Status: AC
  Administered 2022-08-05: 20 mg via INTRAVENOUS
  Filled 2022-08-05: qty 20

## 2022-08-05 MED ORDER — DIPHENHYDRAMINE HCL 25 MG PO CAPS
ORAL_CAPSULE | ORAL | Status: AC
  Filled 2022-08-05: qty 2

## 2022-08-05 MED ORDER — HEPARIN SOD (PORK) LOCK FLUSH 100 UNIT/ML IV SOLN
500.0000 [IU] | Freq: Once | INTRAVENOUS | Status: AC | PRN
  Administered 2022-08-05: 500 [IU]

## 2022-08-05 MED ORDER — DEGARELIX ACETATE 80 MG ~~LOC~~ SOLR
80.0000 mg | Freq: Once | SUBCUTANEOUS | Status: AC
  Administered 2022-08-05: 80 mg via SUBCUTANEOUS
  Filled 2022-08-05: qty 4

## 2022-08-05 MED ORDER — SODIUM CHLORIDE 0.9% FLUSH
10.0000 mL | INTRAVENOUS | Status: DC | PRN
  Administered 2022-08-05: 10 mL

## 2022-08-05 MED ORDER — DARATUMUMAB-HYALURONIDASE-FIHJ 1800-30000 MG-UT/15ML ~~LOC~~ SOLN
1800.0000 mg | Freq: Once | SUBCUTANEOUS | Status: AC
  Administered 2022-08-05: 1800 mg via SUBCUTANEOUS
  Filled 2022-08-05: qty 15

## 2022-08-05 NOTE — Progress Notes (Signed)
Holding Xgeva until September and will re-evaluate post dental visits.  T.O. Dr Rhys Martini, PharmD

## 2022-08-05 NOTE — Assessment & Plan Note (Signed)
On Revlimid and infusion chemotherapy Followed by Dr. Katragadda and Dr. McKay 

## 2022-08-05 NOTE — Progress Notes (Signed)
Luis Stewart, [name] [id number]   CLINIC:  Medical Oncology/Hematology  PCP:  Lindell Spar, MD [address] / [place] [address] [phone]   REASON FOR VISIT:  Follow-up for prostate cancer and multiple myeloma  PRIOR THERAPY: none  NGS Results: not done  CURRENT THERAPY: Carfilzomib (20/70) + Daratumumab SQ + Dexamethasone (20/40) DaraKRd q28d  BRIEF ONCOLOGIC HISTORY:  Oncology History  Multiple myeloma ([employer])  04/09/2022 Initial Diagnosis   Multiple myeloma ([facility])   04/15/2022 -  Chemotherapy   Patient is on Treatment Plan : MYELOMA RELAPSED/REFRACTORY Carfilzomib (20/70) + Daratumumab SQ + Dexamethasone (20/40) DaraKd q28d       CANCER STAGING:  Cancer Staging  Multiple myeloma ([place]) Staging form: Plasma Cell Myeloma and Plasma Cell Disorders, AJCC 8th Edition - Clinical: No stage assigned - Unsigned   INTERVAL HISTORY:  [name], a 71 y.o. male, seen for follow-up and cycle 5 of treatment for multiple myeloma.  He reports taste has been off since start of Revlimid.  Hot flashes are stable.  He will start Revlimid later today.  Numbness is stable.  No infections or fevers noted.  He was evaluated at [facility] on the 10th.  REVIEW OF SYSTEMS:  Review of Systems  Constitutional:  Negative for appetite change, fatigue and unexpected weight change (+4 lbs).  Respiratory:  Positive for cough (allergies). Negative for shortness of breath.   Cardiovascular:  Negative for chest pain.  Gastrointestinal:  Negative for constipation.  Endocrine: Positive for hot flashes.  Neurological:  Positive for headaches and numbness (stable).  All other systems reviewed and are negative.   PAST MEDICAL/SURGICAL HISTORY:  Past Medical History:  Diagnosis Date   Arthritis    Asbestosis (Luis Stewart)    Closed fracture of distal end of fibula with tibia with routine healing 05/28/2020   COVID-19 09/03/2021    Cyst of kidney, acquired    Headache    History of fracture of leg    Right, childhood   Hx of migraine headaches    Hyperlipidemia    Neuropathy    PE (pulmonary thromboembolism) ([facility])    After knee surgery   Prostate cancer (Luis Stewart) 2015   Adenocarcinoma by biopsy   Past Surgical History:  Procedure Laterality Date   BIOPSY PROSTATE  2003 and 2005   COLONOSCOPY  11/28/2008   Dr. Rourk:internal hemorrhoids/tortus colon/ascending colon polyps, tubular adenomas   COLONOSCOPY N/A 09/04/2014   Procedure: COLONOSCOPY;  Surgeon: Daneil Dolin, MD;  [facility];  Service: Endoscopy;  Laterality: N/A;  9:30   COLONOSCOPY N/A 07/17/2017   Procedure: COLONOSCOPY;  Surgeon: Danie Binder, MD;  [facility];  Service: Endoscopy;  Laterality: N/A;   ESOPHAGOGASTRODUODENOSCOPY N/A 07/16/2017   Procedure: ESOPHAGOGASTRODUODENOSCOPY (EGD);  Surgeon: Danie Binder, MD;  [facility];  Service: Endoscopy;  Laterality: N/A;   GIVENS CAPSULE STUDY  07/16/2017   Procedure: GIVENS CAPSULE STUDY;  Surgeon: Danie Binder, MD;  [facility];  Service: Endoscopy;;   JOINT REPLACEMENT N/A    Phreesia 01/12/2021   POLYPECTOMY  07/17/2017   Procedure: POLYPECTOMY;  Surgeon: Danie Binder, MD;  [facility];  Service: Endoscopy;;  cecal   PORTACATH PLACEMENT Left 04/18/2022   Procedure: INSERTION PORT-A-CATH;  Surgeon: Aviva Signs, MD;  Location: [facility];  Service: General;  Laterality: Left;   TOTAL KNEE ARTHROPLASTY Right 06/10/2017  TOTAL KNEE ARTHROPLASTY Right 06/10/2017   Procedure: TOTAL KNEE ARTHROPLASTY;  Surgeon: [name], MD;  Location: [facility];  Service: Orthopedics;  Laterality: Right;    SOCIAL HISTORY:  Social History   Socioeconomic History   Marital status: Married    Spouse name: Not on file   Number of children: Not on file   Years of education: Not on file   Highest education level: Not on file  Occupational History    Occupation: Retired  Tobacco Use   Smoking status: Never   Smokeless tobacco: Never  Vaping Use   Vaping Use: Never used  Substance and Sexual Activity   Alcohol use: No   Drug use: No   Sexual activity: Yes  Other Topics Concern   Not on file  Social History Narrative   Not on file   Social Determinants of Health   Financial Resource Strain: Not on file  Food Insecurity: Not on file  Transportation Needs: No Transportation Needs (02/03/2022)   PRAPARE - Hydrologist (Medical): No    Lack of Transportation (Non-Medical): No  Physical Activity: Sufficiently Active (02/03/2022)   Exercise Vital Sign    Days of Exercise per Week: 4 days    Minutes of Exercise per Session: 60 min  Stress: Not on file  Social Connections: Moderately Integrated (02/03/2022)   Social Connection and Isolation Panel [NHANES]    Frequency of Communication with Friends and Family: More than three times a week    Frequency of Social Gatherings with Friends and Family: More than three times a week    Attends Religious Services: More than 4 times per year    Active Member of [employer] or Organizations: No    Attends Archivist Meetings: Never    Marital Status: Married  Human resources officer Violence: Not At Risk (02/03/2022)   Humiliation, Afraid, Rape, and Kick questionnaire    Fear of Current or Ex-Partner: No    Emotionally Abused: No    Physically Abused: No    Sexually Abused: No    FAMILY HISTORY:  Family History  Problem Relation Age of Onset   Diabetes Mother    Hypertension Mother    Obesity Mother    Heart disease Mother    Mental illness Mother    Hypertension Sister    Obesity Sister    Arthritis Sister    Deep vein thrombosis Father    Dementia Father    Heart disease Father        CABG   Colon cancer Neg Hx     CURRENT MEDICATIONS:  Current Outpatient Medications  Medication Sig Dispense Refill   acetaminophen (TYLENOL) 500 MG tablet Take  1,000 mg by mouth every 6 (six) hours as needed for mild pain or moderate pain.     acyclovir (ZOVIRAX) 400 MG tablet Take 1 tablet (400 mg total) by mouth 2 (two) times daily. 60 tablet 6   aspirin EC 81 MG tablet Take 81 mg by mouth daily. Swallow whole.     azithromycin (ZITHROMAX) 250 MG tablet Take 2 tablets on day 1, then 1 tablet daily on days 2 through 5 6 tablet 0   benzonatate (TESSALON) 100 MG capsule Take 1 capsule (100 mg total) by mouth 2 (two) times daily as needed for cough. 20 capsule 0   Carfilzomib (KYPROLIS IV) Inject into the vein once a week.     Cholecalciferol (VITAMIN D) 2000 units CAPS Take 2,000 Units by  mouth every other day.     daratumumab-hyaluronidase-fihj (DARZALEX FASPRO) 1800-30000 MG-UT/15ML SOLN Inject 1,800 mg into the skin once a week.     lenalidomide (REVLIMID) 15 MG capsule Take 1 capsule (15 mg total) by mouth daily. Take for 21 days, then hold for 7 days. Repeat every 28 days. 21 capsule 0   methocarbamol (ROBAXIN) 500 MG tablet Take 1 tablet (500 mg total) by mouth 3 (three) times daily. (Patient taking differently: Take 500 mg by mouth every 8 (eight) hours as needed for muscle spasms.) 60 tablet 1   Multiple Vitamin (MULTIVITAMIN WITH MINERALS) TABS tablet Take 1 tablet by mouth daily with breakfast. Centrum Silver for Men     nitroGLYCERIN (NITROSTAT) 0.4 MG SL tablet Place 1 tablet (0.4 mg total) under the tongue every 5 (five) minutes as needed for chest pain. 30 tablet 0   pantoprazole (PROTONIX) 40 MG tablet TAKE 1 TABLET BY MOUTH EVERY OTHER DAY 45 tablet 1   prochlorperazine (COMPAZINE) 10 MG tablet Take 1 tablet (10 mg total) by mouth every 6 (six) hours as needed for nausea or vomiting. 30 tablet 3   tamsulosin (FLOMAX) 0.4 MG CAPS capsule Take 1 capsule (0.4 mg total) by mouth daily. 30 capsule 11   traMADol (ULTRAM) 50 MG tablet Take 1 tablet (50 mg total) by mouth every 6 (six) hours as needed. 10 tablet 0   lidocaine-prilocaine (EMLA) cream  Apply a small amount to port a cath site and cover with plastic wrap (do not rub in) 1 hour prior to infusion appointments 30 g 0   No current facility-administered medications for this visit.   Facility-Administered Medications Ordered in Other Visits  Medication Dose Route Frequency Provider Last Rate Last Admin   acetaminophen (TYLENOL) 325 MG tablet            diphenhydrAMINE (BENADRYL) 25 mg capsule            sodium chloride flush (NS) 0.9 % injection 10 mL  10 mL Intracatheter PRN [name], MD   10 mL at 08/05/22 1357    ALLERGIES:  Allergies  Allergen Reactions   Sulfa Antibiotics Other (See Comments)    Unknown reaction. Childhood reaction    PHYSICAL EXAM:  Performance status (ECOG): 1 - Symptomatic but completely ambulatory  There were no vitals filed for this visit. Wt Readings from Last 3 Encounters:  08/05/22 184 lb 6.4 oz (83.6 kg)  08/05/22 183 lb 12.8 oz (83.4 kg)  07/22/22 180 lb (81.6 kg)   Physical Exam Vitals reviewed.  Constitutional:      Appearance: Normal appearance.  Cardiovascular:     Rate and Rhythm: Normal rate and regular rhythm.     Pulses: Normal pulses.     Heart sounds: Normal heart sounds.  Pulmonary:     Effort: Pulmonary effort is normal.     Breath sounds: Normal breath sounds.  Musculoskeletal:     Right lower leg: No edema.     Left lower leg: No edema.  Neurological:     General: No focal deficit present.     Mental Status: He is alert and oriented to person, place, and time.  Psychiatric:        Mood and Affect: Mood normal.        Behavior: Behavior normal.     LABORATORY DATA:  I have reviewed the labs as listed.     Latest Ref Rng & Units 08/05/2022   10:20 AM 07/29/2022  9:47 AM 07/22/2022   10:19 AM  CBC  WBC 4.0 - 10.5 K/uL 2.9  3.1  3.9   Hemoglobin 13.0 - 17.0 g/dL 10.6  10.2  10.5   Hematocrit 39.0 - 52.0 % 31.5  30.7  30.8   Platelets 150 - 400 K/uL 270  141  171       Latest Ref Rng & Units  08/05/2022   10:20 AM 07/29/2022    9:47 AM 07/22/2022   10:19 AM  CMP  Glucose 70 - 99 mg/dL 89  87  88   BUN 8 - 23 mg/dL _0 Creatinine 0.61 - 1.24 mg/dL 1.14  1.17  1.09   Sodium 135 - 145 mmol/L 143  141  139   Potassium 3.5 - 5.1 mmol/L 3.9  3.6  3.5   Chloride 98 - 111 mmol/L 112  109  107   CO2 22 - 32 mmol/L _1 Calcium 8.9 - 10.3 mg/dL 9.1  8.6  8.6   Total Protein 6.5 - 8.1 g/dL 6.0  5.6  5.7   Total Bilirubin 0.3 - 1.2 mg/dL 0.4  0.6  0.6   Alkaline Phos 38 - 126 U/L 62  55  60   AST 15 - 41 U/L _2 ALT 0 - 44 U/L _3 DIAGNOSTIC IMAGING:  I have independently reviewed the scans and discussed with the patient. No results found.   ASSESSMENT:  Prostate cancer: - TRUS/Bx on 06/14/2015: 1/12 cores positive for adenocarcinoma-10% of the core in the right lateral base revealed Gleason 3+3=6.  Prostate volume 175 cc.  PSA was 16.6.  PSA density 0.09. - Seen by Dr. Louis Meckel in [facility] for robotic prostatectomy.  Due to low-volume prostate cancer and very low PSA density, it was recommended that he continue active surveillance. - 01/21/2016: Surveillance TRUS/biopsy, following a prostatic MRI which revealed no suspicious prostatic lesions and correlated the patient's large prostate.  All 12 cores were negative for prostate cancer.  Prostate volume was 190 mL. - 08/11/2018: Fusion TRUS/biopsy: Recent prostate MRI-volume 220 mL, no significant lesions.  No evidence of transcapsular/.  Ureteral, SV/bony or lymphatic disease.  Path-1 core (left apex) revealed Gleason 3+3 and 5% of core. - PSA: 17.16 (10/09/2015), 15.4 (11/04/2016), 17.9 (05/15/2017), 18.4 (04/19/2018), 15.9 (02/15/2019), 15.5 (03/20/2020), 22.4 (03/20/2021) - MRI of the lumbar spine with and without contrast on 11/27/2021 done for back pain showed expanded appearance of the sacrum with diffusely abnormal bone marrow signal and contrast-enhancement concerning for metastatic disease. - CT pelvis  with contrast on 11/27/2021: Expansile, heterogeneous trabeculated appearance of the sacrum with thickening of the cortex, unchanged from multiple prior exams.  No evidence of pelvic lymphadenopathy.  Extreme prostatomegaly. -PSMA PET scan (03/13/2022): Markedly enlarged prostate gland with focal activity inferior left aspect of the gland suggesting prostatic adenocarcinoma.  No nodal metastasis in the abdomen or pelvis.  Multifocal skeletal metastasis in the mid sacrum, right sacral ala, subtle lesions in the L2 and several rib lesions. - Degarelix started on 04/15/2022   Social/family history: - Lives at home with his wife.  Retired in 2003 from [employer].  He works part-time work at an [employer].  Non-smoker. - Sister had cholangiocarcinoma.  Paternal first cousin had metastatic cancer.  Paternal uncle had prostate cancer.  3.  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  Core biopsy demonstrates hypercellular marrow (95%) with large atypical plasma cells arranged in sheets, by CD138 IHC plasma cells comprise 70% of the cellular elements, kappa restricted.  Amyloid was negative. 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  Chronic progressive changes involving the sacrum, left iliac bone including accentuation of trabecular markings and cortical thickening with low FDG uptake suggestive of chronic Paget's disease.  Marked prostate gland enlargement. - BMBX (03/20/2022):Hypercellular bone marrow with at least 25% plasma cells on aspirate with atypical features.  Core biopsy demonstrates hypercellular marrow for age (95%) with large atypical plasma cells arranged in sheets.  By  CD138 IHC, plasma cells comprise approximately 70% of cellular elements and are kappa restricted.  Amyloid deposition was negative by Congo red.  Karyotype showed 20 q. deletion.  Myeloma FISH panel showed complex abnormalities. 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  His last PSA has increased to 7.39 from 4.84.  He will receive degarelix today.  2.  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  Myeloma labs on 07/29/2022 shows M spike improved to 0.1 g.  Free light chain ratio is 1.7.  Proceed with cycle 5 today.  He will come back in 4 weeks for next cycle.  We will also check DIRA, SPEP, S FLC in 6 weeks and see him at that time.  He was evaluated by Dr. Feliciana Rossetti at [facility] and will be presented at the transplant meeting.  3.  Normocytic anemia: - Hemoglobin is 10.6 partly from myelosuppression.  4.  Osteopenia (DEXA scan 06/04/2022: T score -2.1): - He will see dentist on 08/28/2022.  We will start him on denosumab after that.  We discussed side effects.   Orders placed this encounter:  Orders Placed This Encounter  Procedures   Miscellaneous [facility] test (send-out)      [name], MD [place] [phone]

## 2022-08-05 NOTE — Patient Instructions (Addendum)
[place] at [facility] Discharge Instructions   You were seen and examined today by Dr. Delton Coombes.  He reviewed the results of your lab work. Your last PSA was elevated. Your m-spike is 0.1, which may actually be coming from the Darzalex injection. We will run a special blood test next time to see if it is the medication is causing your m-spike to be observed.   We will proceed with your treatment today.  Continue Revlimid as prescribed.   You may get shingles vaccine - Shingrix only. You may get flu and Covid vaccines as well.   Return as scheduled.    Thank you for choosing [place] at [facility] to provide your oncology and hematology care.  To afford each patient quality time with our provider, please arrive at least 15 minutes before your scheduled appointment time.   If you have a lab appointment with the [facility] please come in thru the Main Entrance and check in at the main information desk.  You need to re-schedule your appointment should you arrive 10 or more minutes late.  We strive to give you quality time with our providers, and arriving late affects you and other patients whose appointments are after yours.  Also, if you no show three or more times for appointments you may be dismissed from the clinic at the providers discretion.     Again, thank you for choosing [facility].  Our hope is that these requests will decrease the amount of time that you wait before being seen by our physicians.       _____________________________________________________________  Should you have questions after your visit to [facility], please contact our office at [phone] and follow the prompts.  Our office hours are 8:00 a.m. and 4:30 p.m. Monday - Friday.  Please note that voicemails left after 4:00 p.m. may not be returned until the following business day.  We are closed weekends and major holidays.  You  do have access to a nurse 24-7, just call the main number to the clinic [phone] and do not press any options, hold on the line and a nurse will answer the phone.    For prescription refill requests, have your pharmacy contact our office and allow 72 hours.    Due to Covid, you will need to wear a mask upon entering the hospital. If you do not have a mask, a mask will be given to you at the Main Entrance upon arrival. For doctor visits, patients may have 1 support person age 32 or older with them. For treatment visits, patients can not have anyone with them due to social distancing guidelines and our immunocompromised population.

## 2022-08-05 NOTE — Assessment & Plan Note (Signed)
On Pantoprazole 40 mg QOD 

## 2022-08-05 NOTE — Progress Notes (Signed)
Pt presents today for Kyprolis, Daratumumab SQ injection, and Firmagon injection per provider's order. Vital signs and labs WNL for treatment today. Okay to proceed with treatment today per Dr.K.  Juanna Cao,  Daratumumab SQ injection, and Firmagon injection given today per MD orders. Tolerated infusion without adverse affects. Vital signs stable. No complaints at this time. Discharged from clinic ambulatory in stable condition. Alert and oriented x 3. F/U with [facility] as scheduled.

## 2022-08-05 NOTE — Progress Notes (Signed)
Established Patient Office Visit  Subjective:  Patient ID: Luis Stewart, male    DOB: [birth year]  Age: 71 y.o. MRN: [id number]  CC:  Chief Complaint  Patient presents with   Follow-up    6 month follow up lumbar spinal stenosis pt having headache and sinus drainage the last few days    HPI Luis Stewart is a 71 y.o. male with past medical history of GERD, MM, prostate cancer, chronic low back pain, elevated PSA, asbestosis and atypical chest pain who presents for f/u of his chronic medical conditions.  He complains of nasal congestion, cough and sinus pressure related headache on the right side for the last 5 days.  He denies any fever or chills.  He uses Navage for sinus drainage.  He has been taking Pantoprazole for GERD. Currently denies any nausea, vomiting, dysphagia or odynophagia.  Prostate cancer: Followed by urology and oncology.  He is on degarelix for it.  Denies any dysuria or hematuria currently.  Takes tamsulosin for urinary hesitancy.  Multiple myeloma: Followed by Dr. Delton Coombes and Dr. Feliciana Rossetti.  He is on Revlimid and infusion chemotherapy for it.  His back pain has improved since starting chemotherapy.  He still has mild numbness of the LE.  Denies any recent fall.    Past Medical History:  Diagnosis Date   Arthritis    Asbestosis ([place])    Closed fracture of distal end of fibula with tibia with routine healing 05/28/2020   COVID-19 09/03/2021   Cyst of kidney, acquired    Headache    History of fracture of leg    Right, childhood   Hx of migraine headaches    Hyperlipidemia    Neuropathy    PE (pulmonary thromboembolism) ([place])    After knee surgery   Prostate cancer ([place]) 2015   Adenocarcinoma by biopsy    Past Surgical History:  Procedure Laterality Date   BIOPSY PROSTATE  2003 and 2005   COLONOSCOPY  11/28/2008   Dr. Rourk:internal hemorrhoids/tortus colon/ascending colon polyps, tubular adenomas   COLONOSCOPY N/A  09/04/2014   Procedure: COLONOSCOPY;  Surgeon: Daneil Dolin, MD;  [facility];  Service: Endoscopy;  Laterality: N/A;  9:30   COLONOSCOPY N/A 07/17/2017   Procedure: COLONOSCOPY;  Surgeon: Danie Binder, MD;  [facility];  Service: Endoscopy;  Laterality: N/A;   ESOPHAGOGASTRODUODENOSCOPY N/A 07/16/2017   Procedure: ESOPHAGOGASTRODUODENOSCOPY (EGD);  Surgeon: Danie Binder, MD;  [facility];  Service: Endoscopy;  Laterality: N/A;   GIVENS CAPSULE STUDY  07/16/2017   Procedure: GIVENS CAPSULE STUDY;  Surgeon: Danie Binder, MD;  [facility];  Service: Endoscopy;;   JOINT REPLACEMENT N/A    Phreesia 01/12/2021   POLYPECTOMY  07/17/2017   Procedure: POLYPECTOMY;  Surgeon: Danie Binder, MD;  [facility];  Service: Endoscopy;;  cecal   PORTACATH PLACEMENT Left 04/18/2022   Procedure: INSERTION PORT-A-CATH;  Surgeon: Aviva Signs, MD;  Location: [facility];  Service: General;  Laterality: Left;   TOTAL KNEE ARTHROPLASTY Right 06/10/2017   TOTAL KNEE ARTHROPLASTY Right 06/10/2017   Procedure: TOTAL KNEE ARTHROPLASTY;  Surgeon: [name], MD;  Location: [place];  Service: Orthopedics;  Laterality: Right;    Family History  Problem Relation Age of Onset   Diabetes Mother    Hypertension Mother    Obesity Mother    Heart disease Mother    Mental illness Mother    Hypertension Sister  Obesity Sister    Arthritis Sister    Deep vein thrombosis Father    Dementia Father    Heart disease Father        CABG   Colon cancer Neg Hx     Social History   Socioeconomic History   Marital status: Married    Spouse name: Not on file   Number of children: Not on file   Years of education: Not on file   Highest education level: Not on file  Occupational History   Occupation: Retired  Tobacco Use   Smoking status: Never   Smokeless tobacco: Never  Vaping Use   Vaping Use: Never used  Substance and Sexual Activity   Alcohol  use: No   Drug use: No   Sexual activity: Yes  Other Topics Concern   Not on file  Social History Narrative   Not on file   Social Determinants of Health   Financial Resource Strain: Not on file  Food Insecurity: Not on file  Transportation Needs: No Transportation Needs (02/03/2022)   PRAPARE - Hydrologist (Medical): No    Lack of Transportation (Non-Medical): No  Physical Activity: Sufficiently Active (02/03/2022)   Exercise Vital Sign    Days of Exercise per Week: 4 days    Minutes of Exercise per Session: 60 min  Stress: Not on file  Social Connections: Moderately Integrated (02/03/2022)   Social Connection and Isolation Panel [NHANES]    Frequency of Communication with Friends and Family: More than three times a week    Frequency of Social Gatherings with Friends and Family: More than three times a week    Attends Religious Services: More than 4 times per year    Active Member of [employer] or Organizations: No    Attends Archivist Meetings: Never    Marital Status: Married  Human resources officer Violence: Not At Risk (02/03/2022)   Humiliation, Afraid, Rape, and Kick questionnaire    Fear of Current or Ex-Partner: No    Emotionally Abused: No    Physically Abused: No    Sexually Abused: No    Outpatient Medications Prior to Visit  Medication Sig Dispense Refill   acetaminophen (TYLENOL) 500 MG tablet Take 1,000 mg by mouth every 6 (six) hours as needed for mild pain or moderate pain.     acyclovir (ZOVIRAX) 400 MG tablet Take 1 tablet (400 mg total) by mouth 2 (two) times daily. 60 tablet 6   aspirin EC 81 MG tablet Take 81 mg by mouth daily. Swallow whole.     benzonatate (TESSALON) 100 MG capsule Take 1 capsule (100 mg total) by mouth 2 (two) times daily as needed for cough. 20 capsule 0   Carfilzomib (KYPROLIS IV) Inject into the vein once a week.     Cholecalciferol (VITAMIN D) 2000 units CAPS Take 2,000 Units by mouth every other day.      daratumumab-hyaluronidase-fihj (DARZALEX FASPRO) 1800-30000 MG-UT/15ML SOLN Inject 1,800 mg into the skin once a week.     lenalidomide (REVLIMID) 15 MG capsule Take 1 capsule (15 mg total) by mouth daily. Take for 21 days, then hold for 7 days. Repeat every 28 days. 21 capsule 0   lidocaine-prilocaine (EMLA) cream Apply a small amount to port a cath site and cover with plastic wrap (do not rub in) 1 hour prior to infusion appointments 30 g 0   methocarbamol (ROBAXIN) 500 MG tablet Take 1 tablet (500 mg total) by mouth  3 (three) times daily. (Patient taking differently: Take 500 mg by mouth every 8 (eight) hours as needed for muscle spasms.) 60 tablet 1   Multiple Vitamin (MULTIVITAMIN WITH MINERALS) TABS tablet Take 1 tablet by mouth daily with breakfast. Centrum Silver for Men     nitroGLYCERIN (NITROSTAT) 0.4 MG SL tablet Place 1 tablet (0.4 mg total) under the tongue every 5 (five) minutes as needed for chest pain. 30 tablet 0   pantoprazole (PROTONIX) 40 MG tablet TAKE 1 TABLET BY MOUTH EVERY OTHER DAY 45 tablet 1   prochlorperazine (COMPAZINE) 10 MG tablet Take 1 tablet (10 mg total) by mouth every 6 (six) hours as needed for nausea or vomiting. 30 tablet 3   tamsulosin (FLOMAX) 0.4 MG CAPS capsule Take 1 capsule (0.4 mg total) by mouth daily. 30 capsule 11   traMADol (ULTRAM) 50 MG tablet Take 1 tablet (50 mg total) by mouth every 6 (six) hours as needed. 10 tablet 0   No facility-administered medications prior to visit.    Allergies  Allergen Reactions   Sulfa Antibiotics Other (See Comments)    Unknown reaction. Childhood reaction    ROS Review of Systems  Constitutional:  Negative for chills and fever.  HENT:  Positive for congestion, postnasal drip and sinus pressure. Negative for sore throat.   Eyes:  Negative for pain and discharge.  Respiratory:  Positive for cough. Negative for shortness of breath.   Cardiovascular:  Negative for chest pain and palpitations.   Gastrointestinal:  Negative for constipation, diarrhea, nausea and vomiting.  Endocrine: Negative for polydipsia and polyuria.  Genitourinary:  Negative for dysuria and hematuria.  Musculoskeletal:  Positive for back pain. Negative for neck pain and neck stiffness.  Skin:  Negative for rash.  Neurological:  Positive for numbness. Negative for dizziness, weakness and headaches.  Psychiatric/Behavioral:  Negative for agitation and behavioral problems.       Objective:    Physical Exam Vitals reviewed.  Constitutional:      General: He is not in acute distress.    Appearance: He is not diaphoretic.  HENT:     Head: Normocephalic and atraumatic.     Nose: Congestion present.     Mouth/Throat:     Mouth: Mucous membranes are moist.  Eyes:     General: No scleral icterus.    Extraocular Movements: Extraocular movements intact.  Cardiovascular:     Rate and Rhythm: Normal rate and regular rhythm.     Pulses: Normal pulses.     Heart sounds: Normal heart sounds. No murmur heard. Pulmonary:     Breath sounds: Normal breath sounds. No wheezing or rales.  Abdominal:     Palpations: Abdomen is soft.     Tenderness: There is no abdominal tenderness.  Musculoskeletal:     Cervical back: Neck supple. No tenderness.     Right lower leg: No edema.     Left lower leg: No edema.  Skin:    General: Skin is warm.     Findings: No rash.  Neurological:     General: No focal deficit present.     Mental Status: He is alert and oriented to person, place, and time.     Sensory: Sensory deficit (B/l feet) present.     Motor: No weakness.  Psychiatric:        Mood and Affect: Mood normal.        Behavior: Behavior normal.     BP 132/82 (BP Location: Right Arm, Patient Position:  Sitting, Cuff Size: Normal)   Pulse 60   Resp 18   Ht '6\' 2"'  (1.88 m)   Wt 183 lb 12.8 oz (83.4 kg)   SpO2 95%   BMI 23.60 kg/m  Wt Readings from Last 3 Encounters:  08/05/22 183 lb 12.8 oz (83.4 kg)   07/22/22 180 lb (81.6 kg)  07/15/22 183 lb 14.4 oz (83.4 kg)    Lab Results  Component Value Date   TSH 4.070 02/06/2022   Lab Results  Component Value Date   WBC 3.1 (L) 07/29/2022   HGB 10.2 (L) 07/29/2022   HCT 30.7 (L) 07/29/2022   MCV 92.5 07/29/2022   PLT 141 (L) 07/29/2022   Lab Results  Component Value Date   NA 141 07/29/2022   K 3.6 07/29/2022   CO2 26 07/29/2022   GLUCOSE 87 07/29/2022   BUN 22 07/29/2022   CREATININE 1.17 07/29/2022   BILITOT 0.6 07/29/2022   ALKPHOS 55 07/29/2022   AST 21 07/29/2022   ALT 26 07/29/2022   PROT 5.6 (L) 07/29/2022   ALBUMIN 3.3 (L) 07/29/2022   CALCIUM 8.6 (L) 07/29/2022   ANIONGAP 6 07/29/2022   EGFR 48 (L) 02/06/2022   Lab Results  Component Value Date   CHOL 137 02/06/2022   Lab Results  Component Value Date   HDL 39 (L) 02/06/2022   Lab Results  Component Value Date   LDLCALC 86 02/06/2022   Lab Results  Component Value Date   TRIG 55 02/06/2022   Lab Results  Component Value Date   CHOLHDL 3.5 02/06/2022   Lab Results  Component Value Date   HGBA1C 6.1 (H) 02/06/2022      Assessment & Plan:   Problem List Items Addressed This Visit       Digestive   GERD (gastroesophageal reflux disease) - Primary    On Pantoprazole 40 mg QOD        Genitourinary   Prostate cancer ([place])    Has had prostate biopsy Elevated PSA Follows up with Urologist - under PSA surveillance Did not tolerate Finasteride Referred to Oncology for concern of pelvic mass - On delarelix now      Relevant Medications   azithromycin (ZITHROMAX) 250 MG tablet     Other   Multiple myeloma ([facility])    On Revlimid and infusion chemotherapy Followed by Dr. Delton Coombes and Dr. Feliciana Rossetti      Relevant Medications   azithromycin (ZITHROMAX) 250 MG tablet   Other Visit Diagnoses     Acute non-recurrent frontal sinusitis     Started azithromycin as he is considered immunocompromised Continue Navage as needed for nasal congestion    Relevant Medications   azithromycin (ZITHROMAX) 250 MG tablet       Meds ordered this encounter  Medications   azithromycin (ZITHROMAX) 250 MG tablet    Sig: Take 2 tablets on day 1, then 1 tablet daily on days 2 through 5    Dispense:  6 tablet    Refill:  0    Follow-up: Return in about 6 months (around 02/05/2023) for Annual physical.    Lindell Spar, MD

## 2022-08-05 NOTE — Progress Notes (Signed)
Patients port flushed without difficulty.  Good blood return noted with no bruising or swelling noted at site.  Stable during access and blood draw.  Patient to remain accessed for treatment. 

## 2022-08-05 NOTE — Assessment & Plan Note (Signed)
Has had prostate biopsy Elevated PSA Follows up with Urologist - under PSA surveillance Did not tolerate Finasteride Referred to Oncology for concern of pelvic mass - On delarelix now

## 2022-08-05 NOTE — Patient Instructions (Signed)
[facility]  Discharge Instructions: Thank you for choosing [facility] to provide your oncology and hematology care.  If you have a lab appointment with the [facility], please come in thru the Main Entrance and check in at the main information desk.  Wear comfortable clothing and clothing appropriate for easy access to any Portacath or PICC line.   We strive to give you quality time with your provider. You may need to reschedule your appointment if you arrive late (15 or more minutes).  Arriving late affects you and other patients whose appointments are after yours.  Also, if you miss three or more appointments without notifying the office, you may be dismissed from the clinic at the provider's discretion.      For prescription refill requests, have your pharmacy contact our office and allow 72 hours for refills to be completed.    Today you received the following chemotherapy and/or immunotherapy agents Kyprolis and Dara SQ injection,  and Firmagon   To help prevent nausea and vomiting after your treatment, we encourage you to take your nausea medication as directed.  BELOW ARE SYMPTOMS THAT SHOULD BE REPORTED IMMEDIATELY: *FEVER GREATER THAN 100.4 F (38 C) OR HIGHER *CHILLS OR SWEATING *NAUSEA AND VOMITING THAT IS NOT CONTROLLED WITH YOUR NAUSEA MEDICATION *UNUSUAL SHORTNESS OF BREATH *UNUSUAL BRUISING OR BLEEDING *URINARY PROBLEMS (pain or burning when urinating, or frequent urination) *BOWEL PROBLEMS (unusual diarrhea, constipation, pain near the anus) TENDERNESS IN MOUTH AND THROAT WITH OR WITHOUT PRESENCE OF ULCERS (sore throat, sores in mouth, or a toothache) UNUSUAL RASH, SWELLING OR PAIN  UNUSUAL VAGINAL DISCHARGE OR ITCHING   Items with * indicate a potential emergency and should be followed up as soon as possible or go to the Emergency Department if any problems should occur.  Please show the CHEMOTHERAPY ALERT CARD or IMMUNOTHERAPY  ALERT CARD at check-in to the Emergency Department and triage nurse.  Should you have questions after your visit or need to cancel or reschedule your appointment, please contact [facility] [phone]  and follow the prompts.  Office hours are 8:00 a.m. to 4:30 p.m. Monday - Friday. Please note that voicemails left after 4:00 p.m. may not be returned until the following business day.  We are closed weekends and major holidays. You have access to a nurse at all times for urgent questions. Please call the main number to the clinic [phone] and follow the prompts.  For any non-urgent questions, you may also contact your provider using MyChart. We now offer e-Visits for anyone 75 and older to request care online for non-urgent symptoms. For details visit [email or web address].   Also download the MyChart app! Go to the app store, search "MyChart", open the app, select [facility], and log in with your MyChart username and password.  Masks are optional in the cancer centers. If you would like for your care team to wear a mask while they are taking care of you, please let them know. For doctor visits, patients may have with them one support person who is at least 72 years old. At this time, visitors are not allowed in the infusion area.  Carfilzomib Injection What is this medication? CARFILZOMIB (kar FILZ oh mib) treats multiple myeloma, a type of bone marrow cancer. It works by blocking a protein that causes cancer cells to grow and multiply. This helps to slow or stop the spread of cancer cells. This medicine may be used for other purposes;  ask your health care provider or pharmacist if you have questions. COMMON BRAND NAME(S): KYPROLIS What should I tell my care team before I take this medication? They need to know if you have any of these conditions: Heart disease History of blood clots Irregular heartbeat Kidney disease Liver disease Lung or breathing disease An  unusual or allergic reaction to carfilzomib, or other medications, foods, dyes, or preservatives If you or your partner are pregnant or trying to get pregnant Breastfeeding How should I use this medication? This medication is injected into a vein. It is given by your care team in a hospital or clinic setting. Talk to your care team about the use of this medication in children. Special care may be needed. Overdosage: If you think you have taken too much of this medicine contact a poison control center or emergency room at once. NOTE: This medicine is only for you. Do not share this medicine with others. What if I miss a dose? Keep appointments for follow-up doses. It is important not to miss your dose. Call your care team if you are unable to keep an appointment. What may interact with this medication? Interactions are not expected. This list may not describe all possible interactions. Give your health care provider a list of all the medicines, herbs, non-prescription drugs, or dietary supplements you use. Also tell them if you smoke, drink alcohol, or use illegal drugs. Some items may interact with your medicine. What should I watch for while using this medication? Your condition will be monitored carefully while you are receiving this medication. You may need blood work while taking this medication. Check with your care team if you have severe diarrhea, nausea, and vomiting, or if you sweat a lot. The loss of too much body fluid may make it dangerous for you to take this medication. This medication may affect your coordination, reaction time, or judgment. Do not drive or operate machinery until you know how this medication affects you. Sit up or stand slowly to reduce the risk of dizzy or fainting spells. Drinking alcohol with this medication can increase the risk of these side effects. Talk to your care team if you may be pregnant. Serious birth defects can occur if you take this medication during  pregnancy and for 6 months after the last dose. You will need a negative pregnancy test before starting this medication. Contraception is recommended while taking this medication and for 6 months after the last dose. Your care team can help you find an option that works for you. If your partner can get pregnant, use a condom during sex while taking this medication and for 3 months after the last dose. Do not breastfeed while taking this medication and for 2 weeks after the last dose. This medication may cause infertility. Talk to your care team if you are concerned about your fertility. What side effects may I notice from receiving this medication? Side effects that you should report to your care team as soon as possible: Allergic reactions--skin rash, itching, hives, swelling of the face, lips, tongue, or throat Bleeding--bloody or black, tar-like stools, vomiting blood or brown material that looks like coffee grounds, red or dark brown urine, small red or purple spots on skin, unusual bruising or bleeding Blood clot--pain, swelling, or warmth in the leg, shortness of breath, chest pain Dizziness, loss of balance or coordination, confusion or trouble speaking Heart attack--pain or tightness in the chest, shoulders, arms, or jaw, nausea, shortness of breath, cold or clammy  skin, feeling faint or lightheaded Heart failure--shortness of breath, swelling of the ankles, feet, or hands, sudden weight gain, unusual weakness or fatigue Heart rhythm changes--fast or irregular heartbeat, dizziness, feeling faint or lightheaded, chest pain, trouble breathing Increase in blood pressure Infection--fever, chills, cough, sore throat, wounds that don't heal, pain or trouble when passing urine, general feeling of discomfort or being unwell Infusion reactions--chest pain, shortness of breath or trouble breathing, feeling faint or lightheaded Kidney injury--decrease in the amount of urine, swelling of the ankles,  hands, or feet Liver injury--right upper belly pain, loss of appetite, nausea, light-colored stool, dark yellow or brown urine, yellowing skin or eyes, unusual weakness or fatigue Lung injury--shortness of breath or trouble breathing, cough, spitting up blood, chest pain, fever Pulmonary hypertension--shortness of breath, chest pain, fast or irregular heartbeat, feeling faint or lightheaded, fatigue, swelling of the ankles or feet Stomach pain, bloody diarrhea, pale skin, unusual weakness or fatigue, decrease in the amount of urine, which may be signs of hemolytic uremic syndrome Sudden and severe headache, confusion, change in vision, seizures, which may be signs of posterior reversible encephalopathy syndrome (PRES) TTP--purple spots on the skin or inside the mouth, pale skin, yellowing skin or eyes, unusual weakness or fatigue, fever, fast or irregular heartbeat, confusion, change in vision, trouble speaking, trouble walking Tumor lysis syndrome (TLS)--nausea, vomiting, diarrhea, decrease in the amount of urine, dark urine, unusual weakness or fatigue, confusion, muscle pain or cramps, fast or irregular heartbeat, joint pain Side effects that usually do not require medical attention (report to your care team if they continue or are bothersome): Diarrhea Fatigue Nausea Trouble sleeping This list may not describe all possible side effects. Call your doctor for medical advice about side effects. You may report side effects to FDA at 1-800-FDA-1088. Where should I keep my medication? This medication is given in a hospital or clinic. It will not be stored at home. NOTE: This sheet is a summary. It may not cover all possible information. If you have questions about this medicine, talk to your doctor, pharmacist, or health care provider.  2023 Elsevier/Gold Standard (2022-05-07 00:00:00)  Daratumumab Injection What is this medication? DARATUMUMAB (dar a toom ue mab) treats multiple myeloma, a type  of bone marrow cancer. It works by helping your immune system slow or stop the spread of cancer cells. It is a monoclonal antibody. This medicine may be used for other purposes; ask your health care provider or pharmacist if you have questions. COMMON BRAND NAME(S): DARZALEX What should I tell my care team before I take this medication? They need to know if you have any of these conditions: Hereditary fructose intolerance Infection, such as chickenpox, herpes, hepatitis B virus Lung or breathing disease, such as asthma, COPD An unusual or allergic reaction to daratumumab, sorbitol, other medications, foods, dyes, or preservatives Pregnant or trying to get pregnant Breast-feeding How should I use this medication? This medication is injected into a vein. It is given by your care team in a hospital or clinic setting. Talk to your care team about the use of this medication in children. Special care may be needed. Overdosage: If you think you have taken too much of this medicine contact a poison control center or emergency room at once. NOTE: This medicine is only for you. Do not share this medicine with others. What if I miss a dose? Keep appointments for follow-up doses. It is important not to miss your dose. Call your care team if you are unable  to keep an appointment. What may interact with this medication? Interactions have not been studied. This list may not describe all possible interactions. Give your health care provider a list of all the medicines, herbs, non-prescription drugs, or dietary supplements you use. Also tell them if you smoke, drink alcohol, or use illegal drugs. Some items may interact with your medicine. What should I watch for while using this medication? Your condition will be monitored carefully while you are receiving this medication. This medication can cause serious allergic reactions. To reduce your risk, your care team may give you other medication to take before  receiving this one. Be sure to follow the directions from your care team. This medication can affect the results of blood tests to match your blood type. These changes can last for up to 6 months after the final dose. Your care team will do blood tests to match your blood type before you start treatment. Tell all of your care team that you are being treated with this medication before receiving a blood transfusion. This medication can affect the results of some tests used to determine treatment response; extra tests may be needed to evaluate response. Talk to your care team if you wish to become pregnant or think you are pregnant. This medication can cause serious birth defects if taken during pregnancy and for 3 months after the last dose. A reliable form of contraception is recommended while taking this medication and for 3 months after the last dose. Talk to your care team about effective forms of contraception. Do not breast-feed while taking this medication. What side effects may I notice from receiving this medication? Side effects that you should report to your care team as soon as possible: Allergic reactions--skin rash, itching, hives, swelling of the face, lips, tongue, or throat Infection--fever, chills, cough, sore throat, wounds that don't heal, pain or trouble when passing urine, general feeling of discomfort or being unwell Infusion reactions--chest pain, shortness of breath or trouble breathing, feeling faint or lightheaded Unusual bruising or bleeding Side effects that usually do not require medical attention (report to your care team if they continue or are bothersome): Constipation Diarrhea Fatigue Nausea Pain, tingling, or numbness in the hands or feet Swelling of the ankles, hands, or feet This list may not describe all possible side effects. Call your doctor for medical advice about side effects. You may report side effects to FDA at 1-800-FDA-1088. Where should I keep my  medication? This medication is given in a hospital or clinic. It will not be stored at home. NOTE: This sheet is a summary. It may not cover all possible information. If you have questions about this medicine, talk to your doctor, pharmacist, or health care provider.  2023 Elsevier/Gold Standard (2014-11-06 00:00:00)

## 2022-08-05 NOTE — Patient Instructions (Signed)
Please take Azithromycin as prescribed.  Please use Nasal saline spray or Navage for nasal congestion.  Please continue to take other medications as prescribed.

## 2022-08-06 ENCOUNTER — Encounter: Payer: Self-pay | Admitting: *Deleted

## 2022-08-06 ENCOUNTER — Other Ambulatory Visit: Payer: Self-pay

## 2022-08-12 ENCOUNTER — Inpatient Hospital Stay: Payer: Medicare HMO

## 2022-08-12 VITALS — BP 134/70 | HR 55 | Temp 98.0°F | Resp 18 | Wt 183.0 lb

## 2022-08-12 DIAGNOSIS — C9000 Multiple myeloma not having achieved remission: Secondary | ICD-10-CM | POA: Diagnosis not present

## 2022-08-12 DIAGNOSIS — Z79899 Other long term (current) drug therapy: Secondary | ICD-10-CM | POA: Diagnosis not present

## 2022-08-12 DIAGNOSIS — Z5111 Encounter for antineoplastic chemotherapy: Secondary | ICD-10-CM | POA: Diagnosis not present

## 2022-08-12 DIAGNOSIS — C61 Malignant neoplasm of prostate: Secondary | ICD-10-CM | POA: Diagnosis not present

## 2022-08-12 DIAGNOSIS — Z5112 Encounter for antineoplastic immunotherapy: Secondary | ICD-10-CM | POA: Diagnosis not present

## 2022-08-12 LAB — CBC WITH DIFFERENTIAL/PLATELET
Abs Immature Granulocytes: 0.01 10*3/uL (ref 0.00–0.07)
Basophils Absolute: 0 10*3/uL (ref 0.0–0.1)
Basophils Relative: 1 %
Eosinophils Absolute: 0.4 10*3/uL (ref 0.0–0.5)
Eosinophils Relative: 14 %
HCT: 32.5 % — ABNORMAL LOW (ref 39.0–52.0)
Hemoglobin: 10.8 g/dL — ABNORMAL LOW (ref 13.0–17.0)
Immature Granulocytes: 0 %
Lymphocytes Relative: 17 %
Lymphs Abs: 0.5 10*3/uL — ABNORMAL LOW (ref 0.7–4.0)
MCH: 30.9 pg (ref 26.0–34.0)
MCHC: 33.2 g/dL (ref 30.0–36.0)
MCV: 93.1 fL (ref 80.0–100.0)
Monocytes Absolute: 0.3 10*3/uL (ref 0.1–1.0)
Monocytes Relative: 9 %
Neutro Abs: 1.8 10*3/uL (ref 1.7–7.7)
Neutrophils Relative %: 59 %
Platelets: 138 10*3/uL — ABNORMAL LOW (ref 150–400)
RBC: 3.49 MIL/uL — ABNORMAL LOW (ref 4.22–5.81)
RDW: 15 % (ref 11.5–15.5)
WBC: 3 10*3/uL — ABNORMAL LOW (ref 4.0–10.5)
nRBC: 0 % (ref 0.0–0.2)

## 2022-08-12 LAB — COMPREHENSIVE METABOLIC PANEL
ALT: 25 U/L (ref 0–44)
AST: 21 U/L (ref 15–41)
Albumin: 3.5 g/dL (ref 3.5–5.0)
Alkaline Phosphatase: 60 U/L (ref 38–126)
Anion gap: 5 (ref 5–15)
BUN: 18 mg/dL (ref 8–23)
CO2: 27 mmol/L (ref 22–32)
Calcium: 8.7 mg/dL — ABNORMAL LOW (ref 8.9–10.3)
Chloride: 108 mmol/L (ref 98–111)
Creatinine, Ser: 1.07 mg/dL (ref 0.61–1.24)
GFR, Estimated: >60 mL/min (ref >60)
Glucose, Bld: 88 mg/dL (ref 70–99)
Potassium: 3.5 mmol/L (ref 3.5–5.1)
Sodium: 140 mmol/L (ref 135–145)
Total Bilirubin: 0.7 mg/dL (ref 0.3–1.2)
Total Protein: 5.9 g/dL — ABNORMAL LOW (ref 6.5–8.1)

## 2022-08-12 LAB — MAGNESIUM: Magnesium: 2 mg/dL (ref 1.7–2.4)

## 2022-08-12 MED ORDER — SODIUM CHLORIDE 0.9 % IV SOLN: Freq: Once | INTRAVENOUS | Status: AC

## 2022-08-12 MED ORDER — DEXTROSE 5 % IV SOLN
56.0000 mg/m2 | Freq: Once | INTRAVENOUS | Status: AC
  Administered 2022-08-12: 110 mg via INTRAVENOUS
  Filled 2022-08-12: qty 10

## 2022-08-12 MED ORDER — SODIUM CHLORIDE 0.9% FLUSH
10.0000 mL | INTRAVENOUS | Status: DC | PRN
  Administered 2022-08-12 (×2): 10 mL

## 2022-08-12 MED ORDER — HEPARIN SOD (PORK) LOCK FLUSH 100 UNIT/ML IV SOLN
500.0000 [IU] | Freq: Once | INTRAVENOUS | Status: AC | PRN
  Administered 2022-08-12: 500 [IU]

## 2022-08-12 MED ORDER — SODIUM CHLORIDE 0.9 % IV SOLN
20.0000 mg | Freq: Once | INTRAVENOUS | Status: AC
  Administered 2022-08-12: 20 mg via INTRAVENOUS
  Filled 2022-08-12: qty 20

## 2022-08-12 NOTE — Patient Instructions (Signed)
[facility]  Discharge Instructions: Thank you for choosing [facility] to provide your oncology and hematology care.  If you have a lab appointment with the [facility], please come in thru the Main Entrance and check in at the main information desk.  Wear comfortable clothing and clothing appropriate for easy access to any Portacath or PICC line.   We strive to give you quality time with your provider. You may need to reschedule your appointment if you arrive late (15 or more minutes).  Arriving late affects you and other patients whose appointments are after yours.  Also, if you miss three or more appointments without notifying the office, you may be dismissed from the clinic at the provider's discretion.      For prescription refill requests, have your pharmacy contact our office and allow 72 hours for refills to be completed.    Today you received the following chemotherapy and/or immunotherapy agents Kyprolis, return as scheduled.   To help prevent nausea and vomiting after your treatment, we encourage you to take your nausea medication as directed.  BELOW ARE SYMPTOMS THAT SHOULD BE REPORTED IMMEDIATELY: *FEVER GREATER THAN 100.4 F (38 C) OR HIGHER *CHILLS OR SWEATING *NAUSEA AND VOMITING THAT IS NOT CONTROLLED WITH YOUR NAUSEA MEDICATION *UNUSUAL SHORTNESS OF BREATH *UNUSUAL BRUISING OR BLEEDING *URINARY PROBLEMS (pain or burning when urinating, or frequent urination) *BOWEL PROBLEMS (unusual diarrhea, constipation, pain near the anus) TENDERNESS IN MOUTH AND THROAT WITH OR WITHOUT PRESENCE OF ULCERS (sore throat, sores in mouth, or a toothache) UNUSUAL RASH, SWELLING OR PAIN  UNUSUAL VAGINAL DISCHARGE OR ITCHING   Items with * indicate a potential emergency and should be followed up as soon as possible or go to the Emergency Department if any problems should occur.  Please show the CHEMOTHERAPY ALERT CARD or IMMUNOTHERAPY ALERT CARD at  check-in to the Emergency Department and triage nurse.  Should you have questions after your visit or need to cancel or reschedule your appointment, please contact [facility] [phone]  and follow the prompts.  Office hours are 8:00 a.m. to 4:30 p.m. Monday - Friday. Please note that voicemails left after 4:00 p.m. may not be returned until the following business day.  We are closed weekends and major holidays. You have access to a nurse at all times for urgent questions. Please call the main number to the clinic [phone] and follow the prompts.  For any non-urgent questions, you may also contact your provider using MyChart. We now offer e-Visits for anyone 9 and older to request care online for non-urgent symptoms. For details visit [email or web address].   Also download the MyChart app! Go to the app store, search "MyChart", open the app, select [facility], and log in with your MyChart username and password.  Masks are optional in the cancer centers. If you would like for your care team to wear a mask while they are taking care of you, please let them know. You may have one support person who is at least 72 years old accompany you for your appointments.

## 2022-08-12 NOTE — Progress Notes (Signed)
Patient tolerated chemotherapy with no complaints voiced. Side effects with management reviewed understanding verbalized. Port site clean and dry with no bruising or swelling noted at site. Good blood return noted before and after administration of chemotherapy. Band aid applied. Patient left in satisfactory condition with VSS and no s/s of distress noted. 

## 2022-08-14 ENCOUNTER — Other Ambulatory Visit: Payer: Self-pay | Admitting: *Deleted

## 2022-08-14 MED ORDER — PAXLOVID (300/100) 20 X 150 MG & 10 X 100MG PO TBPK: ORAL_TABLET | ORAL | 0 refills | Status: DC

## 2022-08-14 NOTE — Telephone Encounter (Signed)
Patient called and has tested positive for COVID as of this morning.  Treatment cancelled for next week and Paxlovid called in and instructions given to hold Revlamid while on therapy.  Patient verbalized understanding.  Dr. Delton Coombes made aware.  Will reschedule therapy for the week of 9/4.

## 2022-08-18 ENCOUNTER — Encounter: Payer: Self-pay | Admitting: *Deleted

## 2022-08-19 ENCOUNTER — Inpatient Hospital Stay: Payer: Medicare HMO

## 2022-08-21 ENCOUNTER — Other Ambulatory Visit (HOSPITAL_COMMUNITY): Payer: Self-pay

## 2022-08-22 ENCOUNTER — Other Ambulatory Visit: Payer: Self-pay

## 2022-08-22 DIAGNOSIS — C9000 Multiple myeloma not having achieved remission: Secondary | ICD-10-CM

## 2022-08-22 MED ORDER — LENALIDOMIDE 15 MG PO CAPS: 15.0000 mg | ORAL_CAPSULE | Freq: Every day | ORAL | 0 refills | Status: DC

## 2022-08-22 NOTE — Telephone Encounter (Signed)
Chart reviewed. Revlimid refilled per last office note with Dr. Katragadda.  

## 2022-08-26 ENCOUNTER — Inpatient Hospital Stay (HOSPITAL_BASED_OUTPATIENT_CLINIC_OR_DEPARTMENT_OTHER): Payer: Medicare HMO | Admitting: Hematology

## 2022-08-26 ENCOUNTER — Inpatient Hospital Stay: Payer: Medicare HMO

## 2022-08-26 ENCOUNTER — Inpatient Hospital Stay: Payer: Medicare HMO | Attending: Hematology

## 2022-08-26 VITALS — BP 134/72 | HR 64 | Temp 98.1°F | Resp 18 | Ht 74.0 in | Wt 179.6 lb

## 2022-08-26 DIAGNOSIS — Z5112 Encounter for antineoplastic immunotherapy: Secondary | ICD-10-CM | POA: Diagnosis present

## 2022-08-26 DIAGNOSIS — Z5111 Encounter for antineoplastic chemotherapy: Secondary | ICD-10-CM | POA: Insufficient documentation

## 2022-08-26 DIAGNOSIS — Z79899 Other long term (current) drug therapy: Secondary | ICD-10-CM | POA: Insufficient documentation

## 2022-08-26 DIAGNOSIS — C61 Malignant neoplasm of prostate: Secondary | ICD-10-CM | POA: Diagnosis not present

## 2022-08-26 DIAGNOSIS — C7951 Secondary malignant neoplasm of bone: Secondary | ICD-10-CM | POA: Diagnosis not present

## 2022-08-26 DIAGNOSIS — C9000 Multiple myeloma not having achieved remission: Secondary | ICD-10-CM

## 2022-08-26 LAB — CBC WITH DIFFERENTIAL/PLATELET
Abs Immature Granulocytes: 0.02 10*3/uL (ref 0.00–0.07)
Basophils Absolute: 0.1 10*3/uL (ref 0.0–0.1)
Basophils Relative: 2 %
Eosinophils Absolute: 0.5 10*3/uL (ref 0.0–0.5)
Eosinophils Relative: 15 %
HCT: 30.3 % — ABNORMAL LOW (ref 39.0–52.0)
Hemoglobin: 10.2 g/dL — ABNORMAL LOW (ref 13.0–17.0)
Immature Granulocytes: 1 %
Lymphocytes Relative: 18 %
Lymphs Abs: 0.7 10*3/uL (ref 0.7–4.0)
MCH: 31.2 pg (ref 26.0–34.0)
MCHC: 33.7 g/dL (ref 30.0–36.0)
MCV: 92.7 fL (ref 80.0–100.0)
Monocytes Absolute: 0.3 10*3/uL (ref 0.1–1.0)
Monocytes Relative: 8 %
Neutro Abs: 2 10*3/uL (ref 1.7–7.7)
Neutrophils Relative %: 56 %
Platelets: 268 10*3/uL (ref 150–400)
RBC: 3.27 MIL/uL — ABNORMAL LOW (ref 4.22–5.81)
RDW: 14.7 % (ref 11.5–15.5)
WBC: 3.5 10*3/uL — ABNORMAL LOW (ref 4.0–10.5)
nRBC: 0 % (ref 0.0–0.2)

## 2022-08-26 LAB — COMPREHENSIVE METABOLIC PANEL
ALT: 16 U/L (ref 0–44)
AST: 16 U/L (ref 15–41)
Albumin: 3.3 g/dL — ABNORMAL LOW (ref 3.5–5.0)
Alkaline Phosphatase: 67 U/L (ref 38–126)
Anion gap: 6 (ref 5–15)
BUN: 21 mg/dL (ref 8–23)
CO2: 28 mmol/L (ref 22–32)
Calcium: 8.9 mg/dL (ref 8.9–10.3)
Chloride: 108 mmol/L (ref 98–111)
Creatinine, Ser: 1.27 mg/dL — ABNORMAL HIGH (ref 0.61–1.24)
GFR, Estimated: >60 mL/min (ref >60)
Glucose, Bld: 109 mg/dL — ABNORMAL HIGH (ref 70–99)
Potassium: 3.8 mmol/L (ref 3.5–5.1)
Sodium: 142 mmol/L (ref 135–145)
Total Bilirubin: 0.5 mg/dL (ref 0.3–1.2)
Total Protein: 5.6 g/dL — ABNORMAL LOW (ref 6.5–8.1)

## 2022-08-26 LAB — PSA: Prostatic Specific Antigen: 6.28 ng/mL — ABNORMAL HIGH (ref 0.00–4.00)

## 2022-08-26 LAB — MAGNESIUM: Magnesium: 1.9 mg/dL (ref 1.7–2.4)

## 2022-08-26 LAB — LACTATE DEHYDROGENASE: LDH: 131 U/L (ref 98–192)

## 2022-08-26 MED ORDER — DARATUMUMAB-HYALURONIDASE-FIHJ 1800-30000 MG-UT/15ML ~~LOC~~ SOLN
1800.0000 mg | Freq: Once | SUBCUTANEOUS | Status: AC
  Administered 2022-08-26: 1800 mg via SUBCUTANEOUS
  Filled 2022-08-26: qty 15

## 2022-08-26 MED ORDER — DIPHENHYDRAMINE HCL 25 MG PO CAPS: 50.0000 mg | ORAL_CAPSULE | Freq: Once | ORAL | Status: DC

## 2022-08-26 MED ORDER — DEXTROSE 5 % IV SOLN
56.0000 mg/m2 | Freq: Once | INTRAVENOUS | Status: AC
  Administered 2022-08-26: 110 mg via INTRAVENOUS
  Filled 2022-08-26: qty 10

## 2022-08-26 MED ORDER — ACETAMINOPHEN 325 MG PO TABS: 650.0000 mg | ORAL_TABLET | Freq: Once | ORAL | Status: DC

## 2022-08-26 MED ORDER — SODIUM CHLORIDE 0.9 % IV SOLN: Freq: Once | INTRAVENOUS | Status: AC

## 2022-08-26 MED ORDER — SODIUM CHLORIDE 0.9 % IV SOLN
20.0000 mg | Freq: Once | INTRAVENOUS | Status: AC
  Administered 2022-08-26: 20 mg via INTRAVENOUS
  Filled 2022-08-26: qty 2

## 2022-08-26 MED ORDER — HEPARIN SOD (PORK) LOCK FLUSH 100 UNIT/ML IV SOLN
500.0000 [IU] | Freq: Once | INTRAVENOUS | Status: AC | PRN
  Administered 2022-08-26: 500 [IU]

## 2022-08-26 MED ORDER — DIPHENHYDRAMINE HCL 25 MG PO CAPS
50.0000 mg | ORAL_CAPSULE | Freq: Once | ORAL | Status: AC
  Administered 2022-08-26: 50 mg via ORAL
  Filled 2022-08-26: qty 2

## 2022-08-26 MED ORDER — SODIUM CHLORIDE 0.9% FLUSH
10.0000 mL | INTRAVENOUS | Status: DC | PRN
  Administered 2022-08-26: 10 mL

## 2022-08-26 MED ORDER — ACETAMINOPHEN 325 MG PO TABS
650.0000 mg | ORAL_TABLET | Freq: Once | ORAL | Status: AC
  Administered 2022-08-26: 650 mg via ORAL
  Filled 2022-08-26: qty 2

## 2022-08-26 NOTE — Patient Instructions (Signed)
[facility]  Discharge Instructions: Thank you for choosing [facility] to provide your oncology and hematology care.  If you have a lab appointment with the [facility], please come in thru the Main Entrance and check in at the main information desk.  Wear comfortable clothing and clothing appropriate for easy access to any Portacath or PICC line.   We strive to give you quality time with your provider. You may need to reschedule your appointment if you arrive late (15 or more minutes).  Arriving late affects you and other patients whose appointments are after yours.  Also, if you miss three or more appointments without notifying the office, you may be dismissed from the clinic at the provider's discretion.      For prescription refill requests, have your pharmacy contact our office and allow 72 hours for refills to be completed.    Today you received the following chemotherapy and/or immunotherapy agents Kyprolis/Daratumumab.  Daratumumab; Hyaluronidase Injection What is this medication? DARATUMUMAB; HYALURONIDASE (dar a toom ue mab; hye al ur ON i dase) treats multiple myeloma, a type of bone marrow cancer. Daratumumab works by blocking a protein that causes cancer cells to grow and multiply. This helps to slow or stop the spread of cancer cells. Hyaluronidase works by increasing the absorption of other medications in the body to help them work better. This medication may also be used treat amyloidosis, a condition that causes the buildup of a protein (amyloid) in your body. It works by reducing the buildup of this protein, which decreases symptoms. It is a combination medication that contains a monoclonal antibody. This medicine may be used for other purposes; ask your health care provider or pharmacist if you have questions. COMMON BRAND NAME(S): DARZALEX FASPRO What should I tell my care team before I take this medication? They need to know if you have  any of these conditions: Heart disease Infection, such as chickenpox, cold sores, herpes, hepatitis B Lung or breathing disease An unusual or allergic reaction to daratumumab, hyaluronidase, other medications, foods, dyes, or preservatives Pregnant or trying to get pregnant Breast-feeding How should I use this medication? This medication is injected under the skin. It is given by your care team in a hospital or clinic setting. Talk to your care team about the use of this medication in children. Special care may be needed. Overdosage: If you think you have taken too much of this medicine contact a poison control center or emergency room at once. NOTE: This medicine is only for you. Do not share this medicine with others. What if I miss a dose? Keep appointments for follow-up doses. It is important not to miss your dose. Call your care team if you are unable to keep an appointment. What may interact with this medication? Interactions have not been studied. This list may not describe all possible interactions. Give your health care provider a list of all the medicines, herbs, non-prescription drugs, or dietary supplements you use. Also tell them if you smoke, drink alcohol, or use illegal drugs. Some items may interact with your medicine. What should I watch for while using this medication? Your condition will be monitored carefully while you are receiving this medication. This medication can cause serious allergic reactions. To reduce your risk, your care team may give you other medication to take before receiving this one. Be sure to follow the directions from your care team. This medication can affect the results of blood tests to match  your blood type. These changes can last for up to 6 months after the final dose. Your care team will do blood tests to match your blood type before you start treatment. Tell all of your care team that you are being treated with this medication before receiving a  blood transfusion. This medication can affect the results of some tests used to determine treatment response; extra tests may be needed to evaluate response. Talk to your care team if you wish to become pregnant or think you are pregnant. This medication can cause serious birth defects if taken during pregnancy and for 3 months after the last dose. A reliable form of contraception is recommended while taking this medication and for 3 months after the last dose. Talk to your care team about effective forms of contraception. Do not breast-feed while taking this medication. What side effects may I notice from receiving this medication? Side effects that you should report to your care team as soon as possible: Allergic reactions--skin rash, itching, hives, swelling of the face, lips, tongue, or throat Heart rhythm changes--fast or irregular heartbeat, dizziness, feeling faint or lightheaded, chest pain, trouble breathing Infection--fever, chills, cough, sore throat, wounds that don't heal, pain or trouble when passing urine, general feeling of discomfort or being unwell Infusion reactions--chest pain, shortness of breath or trouble breathing, feeling faint or lightheaded Sudden eye pain or change in vision such as blurry vision, seeing halos around lights, vision loss Unusual bruising or bleeding Side effects that usually do not require medical attention (report to your care team if they continue or are bothersome): Constipation Diarrhea Fatigue Nausea Pain, tingling, or numbness in the hands or feet Swelling of the ankles, hands, or feet This list may not describe all possible side effects. Call your doctor for medical advice about side effects. You may report side effects to FDA at 1-800-FDA-1088. Where should I keep my medication? This medication is given in a hospital or clinic. It will not be stored at home. NOTE: This sheet is a summary. It may not cover all possible information. If you have  questions about this medicine, talk to your doctor, pharmacist, or health care provider.  2023 Elsevier/Gold Standard (2022-04-02 00:00:00)   Carfilzomib Injection What is this medication? CARFILZOMIB (kar FILZ oh mib) treats multiple myeloma, a type of bone marrow cancer. It works by blocking a protein that causes cancer cells to grow and multiply. This helps to slow or stop the spread of cancer cells. This medicine may be used for other purposes; ask your health care provider or pharmacist if you have questions. COMMON BRAND NAME(S): KYPROLIS What should I tell my care team before I take this medication? They need to know if you have any of these conditions: Heart disease History of blood clots Irregular heartbeat Kidney disease Liver disease Lung or breathing disease An unusual or allergic reaction to carfilzomib, or other medications, foods, dyes, or preservatives If you or your partner are pregnant or trying to get pregnant Breastfeeding How should I use this medication? This medication is injected into a vein. It is given by your care team in a hospital or clinic setting. Talk to your care team about the use of this medication in children. Special care may be needed. Overdosage: If you think you have taken too much of this medicine contact a poison control center or emergency room at once. NOTE: This medicine is only for you. Do not share this medicine with others. What if I miss a dose?  Keep appointments for follow-up doses. It is important not to miss your dose. Call your care team if you are unable to keep an appointment. What may interact with this medication? Interactions are not expected. This list may not describe all possible interactions. Give your health care provider a list of all the medicines, herbs, non-prescription drugs, or dietary supplements you use. Also tell them if you smoke, drink alcohol, or use illegal drugs. Some items may interact with your  medicine. What should I watch for while using this medication? Your condition will be monitored carefully while you are receiving this medication. You may need blood work while taking this medication. Check with your care team if you have severe diarrhea, nausea, and vomiting, or if you sweat a lot. The loss of too much body fluid may make it dangerous for you to take this medication. This medication may affect your coordination, reaction time, or judgment. Do not drive or operate machinery until you know how this medication affects you. Sit up or stand slowly to reduce the risk of dizzy or fainting spells. Drinking alcohol with this medication can increase the risk of these side effects. Talk to your care team if you may be pregnant. Serious birth defects can occur if you take this medication during pregnancy and for 6 months after the last dose. You will need a negative pregnancy test before starting this medication. Contraception is recommended while taking this medication and for 6 months after the last dose. Your care team can help you find an option that works for you. If your partner can get pregnant, use a condom during sex while taking this medication and for 3 months after the last dose. Do not breastfeed while taking this medication and for 2 weeks after the last dose. This medication may cause infertility. Talk to your care team if you are concerned about your fertility. What side effects may I notice from receiving this medication? Side effects that you should report to your care team as soon as possible: Allergic reactions--skin rash, itching, hives, swelling of the face, lips, tongue, or throat Bleeding--bloody or black, tar-like stools, vomiting blood or Aliese Brannum material that looks like coffee grounds, red or dark Antario Yasuda urine, small red or purple spots on skin, unusual bruising or bleeding Blood clot--pain, swelling, or warmth in the leg, shortness of breath, chest pain Dizziness, loss of  balance or coordination, confusion or trouble speaking Heart attack--pain or tightness in the chest, shoulders, arms, or jaw, nausea, shortness of breath, cold or clammy skin, feeling faint or lightheaded Heart failure--shortness of breath, swelling of the ankles, feet, or hands, sudden weight gain, unusual weakness or fatigue Heart rhythm changes--fast or irregular heartbeat, dizziness, feeling faint or lightheaded, chest pain, trouble breathing Increase in blood pressure Infection--fever, chills, cough, sore throat, wounds that don't heal, pain or trouble when passing urine, general feeling of discomfort or being unwell Infusion reactions--chest pain, shortness of breath or trouble breathing, feeling faint or lightheaded Kidney injury--decrease in the amount of urine, swelling of the ankles, hands, or feet Liver injury--right upper belly pain, loss of appetite, nausea, light-colored stool, dark yellow or Marabelle Cushman urine, yellowing skin or eyes, unusual weakness or fatigue Lung injury--shortness of breath or trouble breathing, cough, spitting up blood, chest pain, fever Pulmonary hypertension--shortness of breath, chest pain, fast or irregular heartbeat, feeling faint or lightheaded, fatigue, swelling of the ankles or feet Stomach pain, bloody diarrhea, pale skin, unusual weakness or fatigue, decrease in the amount of urine, which  may be signs of hemolytic uremic syndrome Sudden and severe headache, confusion, change in vision, seizures, which may be signs of posterior reversible encephalopathy syndrome (PRES) TTP--purple spots on the skin or inside the mouth, pale skin, yellowing skin or eyes, unusual weakness or fatigue, fever, fast or irregular heartbeat, confusion, change in vision, trouble speaking, trouble walking Tumor lysis syndrome (TLS)--nausea, vomiting, diarrhea, decrease in the amount of urine, dark urine, unusual weakness or fatigue, confusion, muscle pain or cramps, fast or irregular  heartbeat, joint pain Side effects that usually do not require medical attention (report to your care team if they continue or are bothersome): Diarrhea Fatigue Nausea Trouble sleeping This list may not describe all possible side effects. Call your doctor for medical advice about side effects. You may report side effects to FDA at 1-800-FDA-1088. Where should I keep my medication? This medication is given in a hospital or clinic. It will not be stored at home. NOTE: This sheet is a summary. It may not cover all possible information. If you have questions about this medicine, talk to your doctor, pharmacist, or health care provider.  2023 Elsevier/Gold Standard (2022-05-07 00:00:00)       To help prevent nausea and vomiting after your treatment, we encourage you to take your nausea medication as directed.  BELOW ARE SYMPTOMS THAT SHOULD BE REPORTED IMMEDIATELY: *FEVER GREATER THAN 100.4 F (38 C) OR HIGHER *CHILLS OR SWEATING *NAUSEA AND VOMITING THAT IS NOT CONTROLLED WITH YOUR NAUSEA MEDICATION *UNUSUAL SHORTNESS OF BREATH *UNUSUAL BRUISING OR BLEEDING *URINARY PROBLEMS (pain or burning when urinating, or frequent urination) *BOWEL PROBLEMS (unusual diarrhea, constipation, pain near the anus) TENDERNESS IN MOUTH AND THROAT WITH OR WITHOUT PRESENCE OF ULCERS (sore throat, sores in mouth, or a toothache) UNUSUAL RASH, SWELLING OR PAIN  UNUSUAL VAGINAL DISCHARGE OR ITCHING   Items with * indicate a potential emergency and should be followed up as soon as possible or go to the Emergency Department if any problems should occur.  Please show the CHEMOTHERAPY ALERT CARD or IMMUNOTHERAPY ALERT CARD at check-in to the Emergency Department and triage nurse.  Should you have questions after your visit or need to cancel or reschedule your appointment, please contact [facility] [phone]  and follow the prompts.  Office hours are 8:00 a.m. to 4:30 p.m. Monday - Friday.  Please note that voicemails left after 4:00 p.m. may not be returned until the following business day.  We are closed weekends and major holidays. You have access to a nurse at all times for urgent questions. Please call the main number to the clinic [phone] and follow the prompts.  For any non-urgent questions, you may also contact your provider using MyChart. We now offer e-Visits for anyone 41 and older to request care online for non-urgent symptoms. For details visit [email or web address].   Also download the MyChart app! Go to the app store, search "MyChart", open the app, select [facility], and log in with your MyChart username and password.  Masks are optional in the cancer centers. If you would like for your care team to wear a mask while they are taking care of you, please let them know. You may have one support person who is at least 72 years old accompany you for your appointments.

## 2022-08-26 NOTE — Progress Notes (Signed)
[place], [place] [address]   CLINIC:  Medical Oncology/Hematology  PCP:  Lindell Spar, MD [address] / [employer] [place] [address] [phone]   REASON FOR VISIT:  Follow-up for prostate cancer and multiple myeloma  PRIOR THERAPY: none  NGS Results: not done  CURRENT THERAPY: Carfilzomib (20/70) + Daratumumab SQ + Dexamethasone (20/40) DaraKRd q28d  BRIEF ONCOLOGIC HISTORY:  Oncology History  Multiple myeloma ([name])  04/09/2022 Initial Diagnosis   Multiple myeloma ([name])   04/15/2022 -  Chemotherapy   Patient is on Treatment Plan : MYELOMA RELAPSED/REFRACTORY Carfilzomib (20/70) + Daratumumab SQ + Dexamethasone (20/40) DaraKd q28d       CANCER STAGING:  Cancer Staging  Multiple myeloma ([place]) Staging form: Plasma Cell Myeloma and Plasma Cell Disorders, AJCC 8th Edition - Clinical: No stage assigned - Unsigned   INTERVAL HISTORY:  [name], a 71 y.o. male, seen for follow-up of multiple myeloma.  He had COVID infection last week and missed day 15 dose of cycle 5.  He reports some lingering cough mostly dry.  Energy levels are 70%.  Numbness in the legs and feet has been stable.  Denies any fevers.  REVIEW OF SYSTEMS:  Review of Systems  Respiratory:  Positive for cough (allergies).   Endocrine: Negative for hot flashes.  Neurological:  Positive for numbness (stable).  All other systems reviewed and are negative.   PAST MEDICAL/SURGICAL HISTORY:  Past Medical History:  Diagnosis Date   Arthritis    Asbestosis ([place])    Closed fracture of distal end of fibula with tibia with routine healing 05/28/2020   COVID-19 09/03/2021   Cyst of kidney, acquired    Headache    History of fracture of leg    Right, childhood   Hx of migraine headaches    Hyperlipidemia    Neuropathy    PE (pulmonary thromboembolism) ([place])    After knee surgery   Prostate cancer ([name]) 2015   Adenocarcinoma by biopsy   Past  Surgical History:  Procedure Laterality Date   BIOPSY PROSTATE  2003 and 2005   COLONOSCOPY  11/28/2008   Dr. Rourk:internal hemorrhoids/tortus colon/ascending colon polyps, tubular adenomas   COLONOSCOPY N/A 09/04/2014   Procedure: COLONOSCOPY;  Surgeon: Daneil Dolin, MD;  [facility];  Service: Endoscopy;  Laterality: N/A;  9:30   COLONOSCOPY N/A 07/17/2017   Procedure: COLONOSCOPY;  Surgeon: Danie Binder, MD;  [facility];  Service: Endoscopy;  Laterality: N/A;   ESOPHAGOGASTRODUODENOSCOPY N/A 07/16/2017   Procedure: ESOPHAGOGASTRODUODENOSCOPY (EGD);  Surgeon: Danie Binder, MD;  [facility];  Service: Endoscopy;  Laterality: N/A;   GIVENS CAPSULE STUDY  07/16/2017   Procedure: GIVENS CAPSULE STUDY;  Surgeon: Danie Binder, MD;  [facility];  Service: Endoscopy;;   JOINT REPLACEMENT N/A    Phreesia 01/12/2021   POLYPECTOMY  07/17/2017   Procedure: POLYPECTOMY;  Surgeon: Danie Binder, MD;  [facility];  Service: Endoscopy;;  cecal   PORTACATH PLACEMENT Left 04/18/2022   Procedure: INSERTION PORT-A-CATH;  Surgeon: Aviva Signs, MD;  Location: [facility];  Service: General;  Laterality: Left;   TOTAL KNEE ARTHROPLASTY Right 06/10/2017   TOTAL KNEE ARTHROPLASTY Right 06/10/2017   Procedure: TOTAL KNEE ARTHROPLASTY;  Surgeon: [name], MD;  Location: [facility];  Service: Orthopedics;  Laterality: Right;    SOCIAL HISTORY:  Social History   Socioeconomic History  Marital status: Married    Spouse name: Not on file   Number of children: Not on file   Years of education: Not on file   Highest education level: Not on file  Occupational History   Occupation: Retired  Tobacco Use   Smoking status: Never   Smokeless tobacco: Never  Vaping Use   Vaping Use: Never used  Substance and Sexual Activity   Alcohol use: No   Drug use: No   Sexual activity: Yes  Other Topics Concern   Not on file  Social History Narrative    Not on file   Social Determinants of Health   Financial Resource Strain: Not on file  Food Insecurity: Not on file  Transportation Needs: No Transportation Needs (02/03/2022)   PRAPARE - Hydrologist (Medical): No    Lack of Transportation (Non-Medical): No  Physical Activity: Sufficiently Active (02/03/2022)   Exercise Vital Sign    Days of Exercise per Week: 4 days    Minutes of Exercise per Session: 60 min  Stress: Not on file  Social Connections: Moderately Integrated (02/03/2022)   Social Connection and Isolation Panel [NHANES]    Frequency of Communication with Friends and Family: More than three times a week    Frequency of Social Gatherings with Friends and Family: More than three times a week    Attends Religious Services: More than 4 times per year    Active Member of [employer] or Organizations: No    Attends Archivist Meetings: Never    Marital Status: Married  Human resources officer Violence: Not At Risk (02/03/2022)   Humiliation, Afraid, Rape, and Kick questionnaire    Fear of Current or Ex-Partner: No    Emotionally Abused: No    Physically Abused: No    Sexually Abused: No    FAMILY HISTORY:  Family History  Problem Relation Age of Onset   Diabetes Mother    Hypertension Mother    Obesity Mother    Heart disease Mother    Mental illness Mother    Hypertension Sister    Obesity Sister    Arthritis Sister    Deep vein thrombosis Father    Dementia Father    Heart disease Father        CABG   Colon cancer Neg Hx     CURRENT MEDICATIONS:  Current Outpatient Medications  Medication Sig Dispense Refill   acetaminophen (TYLENOL) 500 MG tablet Take 1,000 mg by mouth every 6 (six) hours as needed for mild pain or moderate pain.     acyclovir (ZOVIRAX) 400 MG tablet Take 1 tablet (400 mg total) by mouth 2 (two) times daily. 60 tablet 6   aspirin EC 81 MG tablet Take 81 mg by mouth daily. Swallow whole.     benzonatate (TESSALON)  100 MG capsule Take 1 capsule (100 mg total) by mouth 2 (two) times daily as needed for cough. 20 capsule 0   Carfilzomib (KYPROLIS IV) Inject into the vein once a week.     Cholecalciferol (VITAMIN D) 2000 units CAPS Take 2,000 Units by mouth every other day.     daratumumab-hyaluronidase-fihj (DARZALEX FASPRO) 1800-30000 MG-UT/15ML SOLN Inject 1,800 mg into the skin once a week.     lenalidomide (REVLIMID) 15 MG capsule Take 1 capsule (15 mg total) by mouth daily. Take for 21 days, then hold for 7 days. Repeat every 28 days. 21 capsule 0   methocarbamol (ROBAXIN) 500 MG tablet Take 1  tablet (500 mg total) by mouth 3 (three) times daily. (Patient taking differently: Take 500 mg by mouth every 8 (eight) hours as needed for muscle spasms.) 60 tablet 1   Multiple Vitamin (MULTIVITAMIN WITH MINERALS) TABS tablet Take 1 tablet by mouth daily with breakfast. Centrum Silver for Men     nirmatrelvir & ritonavir (PAXLOVID, 300/100,) 20 x 150 MG & 10 x 100MG TBPK Take pack as directed.  Do not take Revlamid while on therapy and resume day after last dose. 1 each 0   pantoprazole (PROTONIX) 40 MG tablet TAKE 1 TABLET BY MOUTH EVERY OTHER DAY 45 tablet 1   tamsulosin (FLOMAX) 0.4 MG CAPS capsule Take 1 capsule (0.4 mg total) by mouth daily. 30 capsule 11   traMADol (ULTRAM) 50 MG tablet Take 1 tablet (50 mg total) by mouth every 6 (six) hours as needed. 10 tablet 0   lidocaine-prilocaine (EMLA) cream Apply a small amount to port a cath site and cover with plastic wrap (do not rub in) 1 hour prior to infusion appointments (Patient not taking: Reported on 08/26/2022) 30 g 0   nitroGLYCERIN (NITROSTAT) 0.4 MG SL tablet Place 1 tablet (0.4 mg total) under the tongue every 5 (five) minutes as needed for chest pain. (Patient not taking: Reported on 08/26/2022) 30 tablet 0   prochlorperazine (COMPAZINE) 10 MG tablet Take 1 tablet (10 mg total) by mouth every 6 (six) hours as needed for nausea or vomiting. (Patient not  taking: Reported on 08/26/2022) 30 tablet 3   Current Facility-Administered Medications  Medication Dose Route Frequency Provider Last Rate Last Admin   sodium chloride flush (NS) 0.9 % injection 10 mL  10 mL Intracatheter PRN [name], MD   10 mL at 08/26/22 1232   Facility-Administered Medications Ordered in Other Visits  Medication Dose Route Frequency Provider Last Rate Last Admin   acetaminophen (TYLENOL) 325 MG tablet            diphenhydrAMINE (BENADRYL) 25 mg capsule             ALLERGIES:  Allergies  Allergen Reactions   Sulfa Antibiotics Other (See Comments)    Unknown reaction. Childhood reaction    PHYSICAL EXAM:  Performance status (ECOG): 1 - Symptomatic but completely ambulatory  Vitals:   08/26/22 0832  BP: 134/72  Pulse: 64  Resp: 18  Temp: 98.1 F (36.7 C)  SpO2: 100%   Wt Readings from Last 3 Encounters:  08/26/22 179 lb 9.6 oz (81.5 kg)  08/12/22 183 lb (83 kg)  08/05/22 184 lb 6.4 oz (83.6 kg)   Physical Exam Vitals reviewed.  Constitutional:      Appearance: Normal appearance.  Cardiovascular:     Rate and Rhythm: Normal rate and regular rhythm.     Pulses: Normal pulses.     Heart sounds: Normal heart sounds.  Pulmonary:     Effort: Pulmonary effort is normal.     Breath sounds: Normal breath sounds.  Musculoskeletal:     Right lower leg: No edema.     Left lower leg: No edema.  Neurological:     General: No focal deficit present.     Mental Status: He is alert and oriented to person, place, and time.  Psychiatric:        Mood and Affect: Mood normal.        Behavior: Behavior normal.     LABORATORY DATA:  I have reviewed the labs as listed.     Latest Ref  Rng & Units 08/26/2022    8:35 AM 08/12/2022    9:52 AM 08/05/2022   10:20 AM  CBC  WBC 4.0 - 10.5 K/uL 3.5  3.0  2.9   Hemoglobin 13.0 - 17.0 g/dL 10.2  10.8  10.6   Hematocrit 39.0 - 52.0 % 30.3  32.5  31.5   Platelets 150 - 400 K/uL 268  138  270       Latest  Ref Rng & Units 08/26/2022    8:35 AM 08/12/2022    9:52 AM 08/05/2022   10:20 AM  CMP  Glucose 70 - 99 mg/dL 109  88  89   BUN 8 - 23 mg/dL _0 Creatinine 0.61 - 1.24 mg/dL 1.27  1.07  1.14   Sodium 135 - 145 mmol/L 142  140  143   Potassium 3.5 - 5.1 mmol/L 3.8  3.5  3.9   Chloride 98 - 111 mmol/L 108  108  112   CO2 22 - 32 mmol/L _1 Calcium 8.9 - 10.3 mg/dL 8.9  8.7  9.1   Total Protein 6.5 - 8.1 g/dL 5.6  5.9  6.0   Total Bilirubin 0.3 - 1.2 mg/dL 0.5  0.7  0.4   Alkaline Phos 38 - 126 U/L 67  60  62   AST 15 - 41 U/L _2 ALT 0 - 44 U/L _3 DIAGNOSTIC IMAGING:  I have independently reviewed the scans and discussed with the patient. No results found.   ASSESSMENT:  Prostate cancer: - TRUS/Bx on 06/14/2015: 1/12 cores positive for adenocarcinoma-10% of the core in the right lateral base revealed Gleason 3+3=6.  Prostate volume 175 cc.  PSA was 16.6.  PSA density 0.09. - Seen by Dr. Louis Meckel in [facility] for robotic prostatectomy.  Due to low-volume prostate cancer and very low PSA density, it was recommended that he continue active surveillance. - 01/21/2016: Surveillance TRUS/biopsy, following a prostatic MRI which revealed no suspicious prostatic lesions and correlated the patient's large prostate.  All 12 cores were negative for prostate cancer.  Prostate volume was 190 mL. - 08/11/2018: Fusion TRUS/biopsy: Recent prostate MRI-volume 220 mL, no significant lesions.  No evidence of transcapsular/.  Ureteral, SV/bony or lymphatic disease.  Path-1 core (left apex) revealed Gleason 3+3 and 5% of core. - PSA: 17.16 (10/09/2015), 15.4 (11/04/2016), 17.9 (05/15/2017), 18.4 (04/19/2018), 15.9 (02/15/2019), 15.5 (03/20/2020), 22.4 (03/20/2021) - MRI of the lumbar spine with and without contrast on 11/27/2021 done for back pain showed expanded appearance of the sacrum with diffusely abnormal bone marrow signal and contrast-enhancement concerning for metastatic  disease. - CT pelvis with contrast on 11/27/2021: Expansile, heterogeneous trabeculated appearance of the sacrum with thickening of the cortex, unchanged from multiple prior exams.  No evidence of pelvic lymphadenopathy.  Extreme prostatomegaly. -PSMA PET scan (03/13/2022): Markedly enlarged prostate gland with focal activity inferior left aspect of the gland suggesting prostatic adenocarcinoma.  No nodal metastasis in the abdomen or pelvis.  Multifocal skeletal metastasis in the mid sacrum, right sacral ala, subtle lesions in the L2 and several rib lesions. - Degarelix started on 04/15/2022   Social/family history: - Lives at home with his wife.  Retired in 2003 from [employer].  He works part-time work at an [employer].  Non-smoker. - Sister had cholangiocarcinoma.  Paternal first cousin had metastatic cancer.  Paternal uncle had prostate  cancer.  3.  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  Core biopsy demonstrates hypercellular marrow (95%) with large atypical plasma cells arranged in sheets, by CD138 IHC plasma cells comprise 70% of the cellular elements, kappa restricted.  Amyloid was negative. 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  Chronic progressive changes involving the sacrum, left iliac bone including accentuation of trabecular markings and cortical thickening with low FDG uptake suggestive of chronic Paget's disease.  Marked prostate gland enlargement. - BMBX (03/20/2022):Hypercellular bone marrow with at least 25% plasma cells on aspirate with atypical features.  Core biopsy demonstrates hypercellular marrow for age (95%) with large atypical plasma cells  arranged in sheets.  By CD138 IHC, plasma cells comprise approximately 70% of cellular elements and are kappa restricted.  Amyloid deposition was negative by Congo red.  Karyotype showed 20 q. deletion.  Myeloma FISH panel showed complex abnormalities. 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  Last PSA was 7.39, slightly increased from 4.84.  We will check another PSA level today.  Next degarelix injection is next week.  2.  IgG kappa multiple myeloma with complex cytogenetics: - He missed day 15 dose of cycle 5 due to COVID last week.  He is recovering well from [place] although he has lingering dry cough.  He completed 5 days of Paxlovid.  Reviewed labs today which showed creatinine 1.27 and the rest of LFTs are normal.  White count is 3.5 with ANC normal.  We have sent myeloma panel including Darzalex specific immunofixation.  He had to hold his Revlimid while he was taking Paxlovid last week.  He started back on Revlimid and continue 15 mg 3 weeks on/1 week off.  He will proceed with cycle 6-day 1 today.  RTC 4 weeks for follow-up.  3.  Normocytic anemia: - Hemoglobin is 10.2, partly from myelosuppression.  4.  Osteopenia (DEXA scan 06/04/2022: T score -2.1): - Unfortunately his dentist visit was delayed due to [place].  He has another visit scheduled.  We will start denosumab after that.   Orders placed this encounter:  Orders Placed This Encounter  Procedures   PSA   [place], MD [place] [phone]

## 2022-08-26 NOTE — Patient Instructions (Signed)
[facility] at [facility] Discharge Instructions   You were seen and examined today by Dr. Katragadda.  He reviewed your lab work which is normal/stable.   We will proceed with your treatment today.  Return as scheduled.    Thank you for choosing [facility] at [facility] to provide your oncology and hematology care.  To afford each patient quality time with our provider, please arrive at least 15 minutes before your scheduled appointment time.   If you have a lab appointment with the Cancer Center please come in thru the Main Entrance and check in at the main information desk.  You need to re-schedule your appointment should you arrive 10 or more minutes late.  We strive to give you quality time with our providers, and arriving late affects you and other patients whose appointments are after yours.  Also, if you no show three or more times for appointments you may be dismissed from the clinic at the providers discretion.     Again, thank you for choosing [facility].  Our hope is that these requests will decrease the amount of time that you wait before being seen by our physicians.       _____________________________________________________________  Should you have questions after your visit to [facility], please contact our office at [phone] and follow the prompts.  Our office hours are 8:00 a.m. and 4:30 p.m. Monday - Friday.  Please note that voicemails left after 4:00 p.m. may not be returned until the following business day.  We are closed weekends and major holidays.  You do have access to a nurse 24-7, just call the main number to the clinic [phone] and do not press any options, hold on the line and a nurse will answer the phone.    For prescription refill requests, have your pharmacy contact our office and allow 72 hours.    Due to Covid, you will need to wear a mask upon entering the hospital. If  you do not have a mask, a mask will be given to you at the Main Entrance upon arrival. For doctor visits, patients may have 1 support person age 18 or older with them. For treatment visits, patients can not have anyone with them due to social distancing guidelines and our immunocompromised population.      

## 2022-08-26 NOTE — Progress Notes (Signed)
Patient presents today for chemotherapy infusion.  Patient is in satisfactory condition with no complaints voiced.  Vital signs are stable.  Labs reviewed by Dr. Katragadda during his office visit.  All labs are within treatment parameters.  We will proceed with treatment per MD orders.   Patient tolerated treatment well with no complaints voiced.  Patient left ambulatory in stable condition.  Vital signs stable at discharge.  Follow up as scheduled.    

## 2022-08-26 NOTE — Progress Notes (Signed)
Patient has been examined by Dr. Katragadda, and vital signs and labs have been reviewed. ANC, Creatinine, LFTs, hemoglobin, and platelets are within treatment parameters per M.D. - pt may proceed with treatment.  Primary RN and pharmacy notified.  

## 2022-08-27 LAB — KAPPA/LAMBDA LIGHT CHAINS
Kappa free light chain: 5.4 mg/L (ref 3.3–19.4)
Kappa, lambda light chain ratio: 1.42 (ref 0.26–1.65)
Lambda free light chains: 3.8 mg/L — ABNORMAL LOW (ref 5.7–26.3)

## 2022-08-28 LAB — PROTEIN ELECTROPHORESIS, SERUM
A/G Ratio: 1.6 (ref 0.7–1.7)
Albumin ELP: 3.1 g/dL (ref 2.9–4.4)
Alpha-1-Globulin: 0.2 g/dL (ref 0.0–0.4)
Alpha-2-Globulin: 0.5 g/dL (ref 0.4–1.0)
Beta Globulin: 0.8 g/dL (ref 0.7–1.3)
Gamma Globulin: 0.3 g/dL — ABNORMAL LOW (ref 0.4–1.8)
Globulin, Total: 1.9 g/dL — ABNORMAL LOW (ref 2.2–3.9)
M-Spike, %: 0.1 g/dL — ABNORMAL HIGH
Total Protein ELP: 5 g/dL — ABNORMAL LOW (ref 6.0–8.5)

## 2022-08-29 LAB — MISC LABCORP TEST (SEND OUT): Labcorp test code: 123218

## 2022-09-02 ENCOUNTER — Inpatient Hospital Stay: Payer: Medicare HMO

## 2022-09-02 VITALS — BP 112/63 | HR 50 | Temp 97.5°F | Resp 18

## 2022-09-02 DIAGNOSIS — C61 Malignant neoplasm of prostate: Secondary | ICD-10-CM | POA: Diagnosis not present

## 2022-09-02 DIAGNOSIS — C7951 Secondary malignant neoplasm of bone: Secondary | ICD-10-CM | POA: Diagnosis not present

## 2022-09-02 DIAGNOSIS — C9000 Multiple myeloma not having achieved remission: Secondary | ICD-10-CM | POA: Diagnosis not present

## 2022-09-02 DIAGNOSIS — Z5111 Encounter for antineoplastic chemotherapy: Secondary | ICD-10-CM | POA: Diagnosis not present

## 2022-09-02 DIAGNOSIS — Z79899 Other long term (current) drug therapy: Secondary | ICD-10-CM | POA: Diagnosis not present

## 2022-09-02 DIAGNOSIS — Z5112 Encounter for antineoplastic immunotherapy: Secondary | ICD-10-CM | POA: Diagnosis not present

## 2022-09-02 LAB — COMPREHENSIVE METABOLIC PANEL
ALT: 17 U/L (ref 0–44)
AST: 18 U/L (ref 15–41)
Albumin: 3.3 g/dL — ABNORMAL LOW (ref 3.5–5.0)
Alkaline Phosphatase: 60 U/L (ref 38–126)
Anion gap: 5 (ref 5–15)
BUN: 17 mg/dL (ref 8–23)
CO2: 28 mmol/L (ref 22–32)
Calcium: 8.9 mg/dL (ref 8.9–10.3)
Chloride: 109 mmol/L (ref 98–111)
Creatinine, Ser: 1.16 mg/dL (ref 0.61–1.24)
GFR, Estimated: >60 mL/min (ref >60)
Glucose, Bld: 95 mg/dL (ref 70–99)
Potassium: 3.9 mmol/L (ref 3.5–5.1)
Sodium: 142 mmol/L (ref 135–145)
Total Bilirubin: 0.6 mg/dL (ref 0.3–1.2)
Total Protein: 5.6 g/dL — ABNORMAL LOW (ref 6.5–8.1)

## 2022-09-02 LAB — CBC WITH DIFFERENTIAL/PLATELET
Abs Immature Granulocytes: 0.01 10*3/uL (ref 0.00–0.07)
Basophils Absolute: 0 10*3/uL (ref 0.0–0.1)
Basophils Relative: 1 %
Eosinophils Absolute: 0.5 10*3/uL (ref 0.0–0.5)
Eosinophils Relative: 13 %
HCT: 30.9 % — ABNORMAL LOW (ref 39.0–52.0)
Hemoglobin: 10.3 g/dL — ABNORMAL LOW (ref 13.0–17.0)
Immature Granulocytes: 0 %
Lymphocytes Relative: 18 %
Lymphs Abs: 0.6 10*3/uL — ABNORMAL LOW (ref 0.7–4.0)
MCH: 31.1 pg (ref 26.0–34.0)
MCHC: 33.3 g/dL (ref 30.0–36.0)
MCV: 93.4 fL (ref 80.0–100.0)
Monocytes Absolute: 0.4 10*3/uL (ref 0.1–1.0)
Monocytes Relative: 11 %
Neutro Abs: 2 10*3/uL (ref 1.7–7.7)
Neutrophils Relative %: 57 %
Platelets: 141 10*3/uL — ABNORMAL LOW (ref 150–400)
RBC: 3.31 MIL/uL — ABNORMAL LOW (ref 4.22–5.81)
RDW: 15.3 % (ref 11.5–15.5)
WBC: 3.5 10*3/uL — ABNORMAL LOW (ref 4.0–10.5)
nRBC: 0 % (ref 0.0–0.2)

## 2022-09-02 LAB — MAGNESIUM: Magnesium: 1.9 mg/dL (ref 1.7–2.4)

## 2022-09-02 MED ORDER — SODIUM CHLORIDE 0.9 % IV SOLN
20.0000 mg | Freq: Once | INTRAVENOUS | Status: AC
  Administered 2022-09-02: 20 mg via INTRAVENOUS
  Filled 2022-09-02: qty 2

## 2022-09-02 MED ORDER — SODIUM CHLORIDE 0.9 % IV SOLN: Freq: Once | INTRAVENOUS | Status: AC

## 2022-09-02 MED ORDER — DEGARELIX ACETATE 80 MG ~~LOC~~ SOLR
80.0000 mg | Freq: Once | SUBCUTANEOUS | Status: AC
  Administered 2022-09-02: 80 mg via SUBCUTANEOUS
  Filled 2022-09-02: qty 4

## 2022-09-02 MED ORDER — SODIUM CHLORIDE 0.9% FLUSH
10.0000 mL | INTRAVENOUS | Status: DC | PRN
  Administered 2022-09-02: 10 mL

## 2022-09-02 MED ORDER — DEXTROSE 5 % IV SOLN
56.0000 mg/m2 | Freq: Once | INTRAVENOUS | Status: AC
  Administered 2022-09-02: 110 mg via INTRAVENOUS
  Filled 2022-09-02: qty 10

## 2022-09-02 MED ORDER — HEPARIN SOD (PORK) LOCK FLUSH 100 UNIT/ML IV SOLN
500.0000 [IU] | Freq: Once | INTRAVENOUS | Status: AC | PRN
  Administered 2022-09-02: 500 [IU]

## 2022-09-02 NOTE — Progress Notes (Signed)
Patient presents today for Kyprolis and Firmagon injection. Patient reports that his missed dental procedure has not been rescheduled yet, we will hold off on Xgeva injection until dental work is performed per Dr. Leeroy Cha note.  Patient tolerated firmagon injection with no complaints voiced. Site clean and dry with no bruising or swelling noted at site. See MAR for details. Band aid applied.  Patient stable during and after injection.  Patient tolerated Kyprolis with no complaints voiced. Side effects with management reviewed understanding verbalized. Port site clean and dry with no bruising or swelling noted at site. Good blood return noted before and after administration of chemotherapy. Band aid applied. Patient left in satisfactory condition with VSS and no s/s of distress noted.

## 2022-09-02 NOTE — Progress Notes (Signed)
Patients port flushed without difficulty.  Good blood return noted with no bruising or swelling noted at site.  Stable during access and blood draw.  Patient to remain accessed for treatment. 

## 2022-09-02 NOTE — Patient Instructions (Signed)
[facility]  Discharge Instructions: Thank you for choosing [facility] to provide your oncology and hematology care.  If you have a lab appointment with the [facility], please come in thru the Main Entrance and check in at the main information desk.  Wear comfortable clothing and clothing appropriate for easy access to any Portacath or PICC line.   We strive to give you quality time with your provider. You may need to reschedule your appointment if you arrive late (15 or more minutes).  Arriving late affects you and other patients whose appointments are after yours.  Also, if you miss three or more appointments without notifying the office, you may be dismissed from the clinic at the provider's discretion.      For prescription refill requests, have your pharmacy contact our office and allow 72 hours for refills to be completed.    Today you received the following chemotherapy and/or immunotherapy agents Kyprolis, and firmagon injection, return as scheduled.   To help prevent nausea and vomiting after your treatment, we encourage you to take your nausea medication as directed.  BELOW ARE SYMPTOMS THAT SHOULD BE REPORTED IMMEDIATELY: *FEVER GREATER THAN 100.4 F (38 C) OR HIGHER *CHILLS OR SWEATING *NAUSEA AND VOMITING THAT IS NOT CONTROLLED WITH YOUR NAUSEA MEDICATION *UNUSUAL SHORTNESS OF BREATH *UNUSUAL BRUISING OR BLEEDING *URINARY PROBLEMS (pain or burning when urinating, or frequent urination) *BOWEL PROBLEMS (unusual diarrhea, constipation, pain near the anus) TENDERNESS IN MOUTH AND THROAT WITH OR WITHOUT PRESENCE OF ULCERS (sore throat, sores in mouth, or a toothache) UNUSUAL RASH, SWELLING OR PAIN  UNUSUAL VAGINAL DISCHARGE OR ITCHING   Items with * indicate a potential emergency and should be followed up as soon as possible or go to the Emergency Department if any problems should occur.  Please show the CHEMOTHERAPY ALERT CARD or  IMMUNOTHERAPY ALERT CARD at check-in to the Emergency Department and triage nurse.  Should you have questions after your visit or need to cancel or reschedule your appointment, please contact [facility] [phone]  and follow the prompts.  Office hours are 8:00 a.m. to 4:30 p.m. Monday - Friday. Please note that voicemails left after 4:00 p.m. may not be returned until the following business day.  We are closed weekends and major holidays. You have access to a nurse at all times for urgent questions. Please call the main number to the clinic [phone] and follow the prompts.  For any non-urgent questions, you may also contact your provider using MyChart. We now offer e-Visits for anyone 52 and older to request care online for non-urgent symptoms. For details visit [email or web address].   Also download the MyChart app! Go to the app store, search "MyChart", open the app, select [facility], and log in with your MyChart username and password.  Masks are optional in the cancer centers. If you would like for your care team to wear a mask while they are taking care of you, please let them know. You may have one support person who is at least 72 years old accompany you for your appointments.

## 2022-09-09 ENCOUNTER — Inpatient Hospital Stay: Payer: Medicare HMO

## 2022-09-09 DIAGNOSIS — Z95828 Presence of other vascular implants and grafts: Secondary | ICD-10-CM

## 2022-09-09 DIAGNOSIS — Z79899 Other long term (current) drug therapy: Secondary | ICD-10-CM | POA: Diagnosis not present

## 2022-09-09 DIAGNOSIS — Z5112 Encounter for antineoplastic immunotherapy: Secondary | ICD-10-CM | POA: Diagnosis not present

## 2022-09-09 DIAGNOSIS — C9000 Multiple myeloma not having achieved remission: Secondary | ICD-10-CM

## 2022-09-09 DIAGNOSIS — Z5111 Encounter for antineoplastic chemotherapy: Secondary | ICD-10-CM | POA: Diagnosis not present

## 2022-09-09 DIAGNOSIS — C7951 Secondary malignant neoplasm of bone: Secondary | ICD-10-CM | POA: Diagnosis not present

## 2022-09-09 DIAGNOSIS — C61 Malignant neoplasm of prostate: Secondary | ICD-10-CM | POA: Diagnosis not present

## 2022-09-09 LAB — CBC WITH DIFFERENTIAL/PLATELET
Abs Immature Granulocytes: 0.01 10*3/uL (ref 0.00–0.07)
Basophils Absolute: 0 10*3/uL (ref 0.0–0.1)
Basophils Relative: 1 %
Eosinophils Absolute: 0.3 10*3/uL (ref 0.0–0.5)
Eosinophils Relative: 8 %
HCT: 31 % — ABNORMAL LOW (ref 39.0–52.0)
Hemoglobin: 10.4 g/dL — ABNORMAL LOW (ref 13.0–17.0)
Immature Granulocytes: 0 %
Lymphocytes Relative: 30 %
Lymphs Abs: 0.9 10*3/uL (ref 0.7–4.0)
MCH: 31.7 pg (ref 26.0–34.0)
MCHC: 33.5 g/dL (ref 30.0–36.0)
MCV: 94.5 fL (ref 80.0–100.0)
Monocytes Absolute: 0.4 10*3/uL (ref 0.1–1.0)
Monocytes Relative: 13 %
Neutro Abs: 1.5 10*3/uL — ABNORMAL LOW (ref 1.7–7.7)
Neutrophils Relative %: 48 %
Platelets: 126 10*3/uL — ABNORMAL LOW (ref 150–400)
RBC: 3.28 MIL/uL — ABNORMAL LOW (ref 4.22–5.81)
RDW: 15.9 % — ABNORMAL HIGH (ref 11.5–15.5)
WBC: 3.1 10*3/uL — ABNORMAL LOW (ref 4.0–10.5)
nRBC: 0 % (ref 0.0–0.2)

## 2022-09-09 LAB — COMPREHENSIVE METABOLIC PANEL
ALT: 20 U/L (ref 0–44)
AST: 18 U/L (ref 15–41)
Albumin: 3.3 g/dL — ABNORMAL LOW (ref 3.5–5.0)
Alkaline Phosphatase: 63 U/L (ref 38–126)
Anion gap: 6 (ref 5–15)
BUN: 26 mg/dL — ABNORMAL HIGH (ref 8–23)
CO2: 26 mmol/L (ref 22–32)
Calcium: 8.7 mg/dL — ABNORMAL LOW (ref 8.9–10.3)
Chloride: 107 mmol/L (ref 98–111)
Creatinine, Ser: 1.22 mg/dL (ref 0.61–1.24)
GFR, Estimated: >60 mL/min (ref >60)
Glucose, Bld: 95 mg/dL (ref 70–99)
Potassium: 4 mmol/L (ref 3.5–5.1)
Sodium: 139 mmol/L (ref 135–145)
Total Bilirubin: 0.5 mg/dL (ref 0.3–1.2)
Total Protein: 5.6 g/dL — ABNORMAL LOW (ref 6.5–8.1)

## 2022-09-09 LAB — MAGNESIUM: Magnesium: 2 mg/dL (ref 1.7–2.4)

## 2022-09-09 MED ORDER — HEPARIN SOD (PORK) LOCK FLUSH 100 UNIT/ML IV SOLN
500.0000 [IU] | Freq: Once | INTRAVENOUS | Status: AC
  Administered 2022-09-09: 500 [IU] via INTRAVENOUS

## 2022-09-09 MED ORDER — SODIUM CHLORIDE 0.9% FLUSH
10.0000 mL | Freq: Once | INTRAVENOUS | Status: AC
  Administered 2022-09-09: 10 mL via INTRAVENOUS

## 2022-09-09 NOTE — Progress Notes (Signed)
Patient presents today for Day 15 of Dara/Kyprolsis. [place] dropped from 2.0 last week to 1.5 this week, Burns Spain NP made aware. Per Burns Spain NP HOLD treatment today d/t drop in [place] and previous COVID injection two weeks ago, and return next week for possible treatment. Patient educated on neutropenic precautions.  Port flushed with good blood return noted. No bruising or swelling at site. Bandaid applied and patient discharged in satisfactory condition. VVS stable with no signs or symptoms of distressed noted.

## 2022-09-09 NOTE — Patient Instructions (Signed)
[facility]  Discharge Instructions: Thank you for choosing [facility] to provide your oncology and hematology care.  If you have a lab appointment with the [facility], please come in thru the Main Entrance and check in at the main information desk.  Wear comfortable clothing and clothing appropriate for easy access to any Portacath or PICC line.   We strive to give you quality time with your provider. You may need to reschedule your appointment if you arrive late (15 or more minutes).  Arriving late affects you and other patients whose appointments are after yours.  Also, if you miss three or more appointments without notifying the office, you may be dismissed from the clinic at the provider's discretion.      For prescription refill requests, have your pharmacy contact our office and allow 72 hours for refills to be completed.    Today your treatment was held until next week, port flushed, return as scheduled.   To help prevent nausea and vomiting after your treatment, we encourage you to take your nausea medication as directed.  BELOW ARE SYMPTOMS THAT SHOULD BE REPORTED IMMEDIATELY: *FEVER GREATER THAN 100.4 F (38 C) OR HIGHER *CHILLS OR SWEATING *NAUSEA AND VOMITING THAT IS NOT CONTROLLED WITH YOUR NAUSEA MEDICATION *UNUSUAL SHORTNESS OF BREATH *UNUSUAL BRUISING OR BLEEDING *URINARY PROBLEMS (pain or burning when urinating, or frequent urination) *BOWEL PROBLEMS (unusual diarrhea, constipation, pain near the anus) TENDERNESS IN MOUTH AND THROAT WITH OR WITHOUT PRESENCE OF ULCERS (sore throat, sores in mouth, or a toothache) UNUSUAL RASH, SWELLING OR PAIN  UNUSUAL VAGINAL DISCHARGE OR ITCHING   Items with * indicate a potential emergency and should be followed up as soon as possible or go to the Emergency Department if any problems should occur.  Please show the CHEMOTHERAPY ALERT CARD or IMMUNOTHERAPY ALERT CARD at check-in to the Emergency  Department and triage nurse.  Should you have questions after your visit or need to cancel or reschedule your appointment, please contact [facility] [phone]  and follow the prompts.  Office hours are 8:00 a.m. to 4:30 p.m. Monday - Friday. Please note that voicemails left after 4:00 p.m. may not be returned until the following business day.  We are closed weekends and major holidays. You have access to a nurse at all times for urgent questions. Please call the main number to the clinic [phone] and follow the prompts.  For any non-urgent questions, you may also contact your provider using MyChart. We now offer e-Visits for anyone 33 and older to request care online for non-urgent symptoms. For details visit [email or web address].   Also download the MyChart app! Go to the app store, search "MyChart", open the app, select [facility], and log in with your MyChart username and password.  Masks are optional in the cancer centers. If you would like for your care team to wear a mask while they are taking care of you, please let them know. You may have one support person who is at least 72 years old accompany you for your appointments.

## 2022-09-16 ENCOUNTER — Inpatient Hospital Stay: Payer: Medicare HMO

## 2022-09-16 ENCOUNTER — Ambulatory Visit: Payer: Medicare HMO | Admitting: Hematology

## 2022-09-16 ENCOUNTER — Ambulatory Visit: Payer: Medicare HMO

## 2022-09-16 ENCOUNTER — Other Ambulatory Visit: Payer: Medicare HMO

## 2022-09-16 VITALS — BP 118/62 | HR 53 | Temp 97.9°F | Resp 18

## 2022-09-16 DIAGNOSIS — Z79899 Other long term (current) drug therapy: Secondary | ICD-10-CM | POA: Diagnosis not present

## 2022-09-16 DIAGNOSIS — C9000 Multiple myeloma not having achieved remission: Secondary | ICD-10-CM | POA: Diagnosis not present

## 2022-09-16 DIAGNOSIS — Z5111 Encounter for antineoplastic chemotherapy: Secondary | ICD-10-CM | POA: Diagnosis not present

## 2022-09-16 DIAGNOSIS — C61 Malignant neoplasm of prostate: Secondary | ICD-10-CM | POA: Diagnosis not present

## 2022-09-16 DIAGNOSIS — C7951 Secondary malignant neoplasm of bone: Secondary | ICD-10-CM | POA: Diagnosis not present

## 2022-09-16 DIAGNOSIS — Z5112 Encounter for antineoplastic immunotherapy: Secondary | ICD-10-CM | POA: Diagnosis not present

## 2022-09-16 LAB — COMPREHENSIVE METABOLIC PANEL
ALT: 23 U/L (ref 0–44)
AST: 21 U/L (ref 15–41)
Albumin: 3.5 g/dL (ref 3.5–5.0)
Alkaline Phosphatase: 72 U/L (ref 38–126)
Anion gap: 7 (ref 5–15)
BUN: 20 mg/dL (ref 8–23)
CO2: 26 mmol/L (ref 22–32)
Calcium: 8.9 mg/dL (ref 8.9–10.3)
Chloride: 108 mmol/L (ref 98–111)
Creatinine, Ser: 1.17 mg/dL (ref 0.61–1.24)
GFR, Estimated: >60 mL/min (ref >60)
Glucose, Bld: 103 mg/dL — ABNORMAL HIGH (ref 70–99)
Potassium: 3.8 mmol/L (ref 3.5–5.1)
Sodium: 141 mmol/L (ref 135–145)
Total Bilirubin: 0.8 mg/dL (ref 0.3–1.2)
Total Protein: 5.9 g/dL — ABNORMAL LOW (ref 6.5–8.1)

## 2022-09-16 LAB — CBC WITH DIFFERENTIAL/PLATELET
Abs Immature Granulocytes: 0 10*3/uL (ref 0.00–0.07)
Basophils Absolute: 0.1 10*3/uL (ref 0.0–0.1)
Basophils Relative: 2 %
Eosinophils Absolute: 0.2 10*3/uL (ref 0.0–0.5)
Eosinophils Relative: 5 %
HCT: 32.1 % — ABNORMAL LOW (ref 39.0–52.0)
Hemoglobin: 10.8 g/dL — ABNORMAL LOW (ref 13.0–17.0)
Immature Granulocytes: 0 %
Lymphocytes Relative: 28 %
Lymphs Abs: 0.9 10*3/uL (ref 0.7–4.0)
MCH: 31.9 pg (ref 26.0–34.0)
MCHC: 33.6 g/dL (ref 30.0–36.0)
MCV: 94.7 fL (ref 80.0–100.0)
Monocytes Absolute: 0.3 10*3/uL (ref 0.1–1.0)
Monocytes Relative: 9 %
Neutro Abs: 1.9 10*3/uL (ref 1.7–7.7)
Neutrophils Relative %: 56 %
Platelets: 210 10*3/uL (ref 150–400)
RBC: 3.39 MIL/uL — ABNORMAL LOW (ref 4.22–5.81)
RDW: 16.7 % — ABNORMAL HIGH (ref 11.5–15.5)
WBC: 3.4 10*3/uL — ABNORMAL LOW (ref 4.0–10.5)
nRBC: 0 % (ref 0.0–0.2)

## 2022-09-16 LAB — MAGNESIUM: Magnesium: 1.8 mg/dL (ref 1.7–2.4)

## 2022-09-16 MED ORDER — ACETAMINOPHEN 325 MG PO TABS
650.0000 mg | ORAL_TABLET | Freq: Once | ORAL | Status: AC
  Administered 2022-09-16: 650 mg via ORAL
  Filled 2022-09-16: qty 2

## 2022-09-16 MED ORDER — SODIUM CHLORIDE 0.9% FLUSH
10.0000 mL | INTRAVENOUS | Status: DC | PRN
  Administered 2022-09-16: 10 mL

## 2022-09-16 MED ORDER — HEPARIN SOD (PORK) LOCK FLUSH 100 UNIT/ML IV SOLN
500.0000 [IU] | Freq: Once | INTRAVENOUS | Status: AC | PRN
  Administered 2022-09-16: 500 [IU]

## 2022-09-16 MED ORDER — DIPHENHYDRAMINE HCL 25 MG PO CAPS
50.0000 mg | ORAL_CAPSULE | Freq: Once | ORAL | Status: AC
  Administered 2022-09-16: 50 mg via ORAL
  Filled 2022-09-16: qty 2

## 2022-09-16 MED ORDER — DARATUMUMAB-HYALURONIDASE-FIHJ 1800-30000 MG-UT/15ML ~~LOC~~ SOLN
1800.0000 mg | Freq: Once | SUBCUTANEOUS | Status: AC
  Administered 2022-09-16: 1800 mg via SUBCUTANEOUS
  Filled 2022-09-16: qty 15

## 2022-09-16 MED ORDER — SODIUM CHLORIDE 0.9 % IV SOLN
20.0000 mg | Freq: Once | INTRAVENOUS | Status: AC
  Administered 2022-09-16: 20 mg via INTRAVENOUS
  Filled 2022-09-16: qty 20

## 2022-09-16 MED ORDER — SODIUM CHLORIDE 0.9 % IV SOLN: Freq: Once | INTRAVENOUS | Status: AC

## 2022-09-16 MED ORDER — DEXTROSE 5 % IV SOLN
56.0000 mg/m2 | Freq: Once | INTRAVENOUS | Status: AC
  Administered 2022-09-16: 110 mg via INTRAVENOUS
  Filled 2022-09-16: qty 30

## 2022-09-16 NOTE — Patient Instructions (Signed)
[facility]  Discharge Instructions: Thank you for choosing [facility] to provide your oncology and hematology care.  If you have a lab appointment with the [facility], please come in thru the Main Entrance and check in at the main information desk.  Wear comfortable clothing and clothing appropriate for easy access to any Portacath or PICC line.   We strive to give you quality time with your provider. You may need to reschedule your appointment if you arrive late (15 or more minutes).  Arriving late affects you and other patients whose appointments are after yours.  Also, if you miss three or more appointments without notifying the office, you may be dismissed from the clinic at the provider's discretion.      For prescription refill requests, have your pharmacy contact our office and allow 72 hours for refills to be completed.    Today you received the following chemotherapy and/or immunotherapy agents Darzalex Faspro and Kyprolis.  Carfilzomib Injection What is this medication? CARFILZOMIB (kar FILZ oh mib) treats multiple myeloma, a type of bone marrow cancer. It works by blocking a protein that causes cancer cells to grow and multiply. This helps to slow or stop the spread of cancer cells. This medicine may be used for other purposes; ask your health care provider or pharmacist if you have questions. COMMON BRAND NAME(S): KYPROLIS What should I tell my care team before I take this medication? They need to know if you have any of these conditions: Heart disease History of blood clots Irregular heartbeat Kidney disease Liver disease Lung or breathing disease An unusual or allergic reaction to carfilzomib, or other medications, foods, dyes, or preservatives If you or your partner are pregnant or trying to get pregnant Breastfeeding How should I use this medication? This medication is injected into a vein. It is given by your care team in a  hospital or clinic setting. Talk to your care team about the use of this medication in children. Special care may be needed. Overdosage: If you think you have taken too much of this medicine contact a poison control center or emergency room at once. NOTE: This medicine is only for you. Do not share this medicine with others. What if I miss a dose? Keep appointments for follow-up doses. It is important not to miss your dose. Call your care team if you are unable to keep an appointment. What may interact with this medication? Interactions are not expected. This list may not describe all possible interactions. Give your health care provider a list of all the medicines, herbs, non-prescription drugs, or dietary supplements you use. Also tell them if you smoke, drink alcohol, or use illegal drugs. Some items may interact with your medicine. What should I watch for while using this medication? Your condition will be monitored carefully while you are receiving this medication. You may need blood work while taking this medication. Check with your care team if you have severe diarrhea, nausea, and vomiting, or if you sweat a lot. The loss of too much body fluid may make it dangerous for you to take this medication. This medication may affect your coordination, reaction time, or judgment. Do not drive or operate machinery until you know how this medication affects you. Sit up or stand slowly to reduce the risk of dizzy or fainting spells. Drinking alcohol with this medication can increase the risk of these side effects. Talk to your care team if you may be pregnant. Serious  birth defects can occur if you take this medication during pregnancy and for 6 months after the last dose. You will need a negative pregnancy test before starting this medication. Contraception is recommended while taking this medication and for 6 months after the last dose. Your care team can help you find an option that works for you. If  your partner can get pregnant, use a condom during sex while taking this medication and for 3 months after the last dose. Do not breastfeed while taking this medication and for 2 weeks after the last dose. This medication may cause infertility. Talk to your care team if you are concerned about your fertility. What side effects may I notice from receiving this medication? Side effects that you should report to your care team as soon as possible: Allergic reactions--skin rash, itching, hives, swelling of the face, lips, tongue, or throat Bleeding--bloody or black, tar-like stools, vomiting blood or brown material that looks like coffee grounds, red or dark brown urine, small red or purple spots on skin, unusual bruising or bleeding Blood clot--pain, swelling, or warmth in the leg, shortness of breath, chest pain Dizziness, loss of balance or coordination, confusion or trouble speaking Heart attack--pain or tightness in the chest, shoulders, arms, or jaw, nausea, shortness of breath, cold or clammy skin, feeling faint or lightheaded Heart failure--shortness of breath, swelling of the ankles, feet, or hands, sudden weight gain, unusual weakness or fatigue Heart rhythm changes--fast or irregular heartbeat, dizziness, feeling faint or lightheaded, chest pain, trouble breathing Increase in blood pressure Infection--fever, chills, cough, sore throat, wounds that don't heal, pain or trouble when passing urine, general feeling of discomfort or being unwell Infusion reactions--chest pain, shortness of breath or trouble breathing, feeling faint or lightheaded Kidney injury--decrease in the amount of urine, swelling of the ankles, hands, or feet Liver injury--right upper belly pain, loss of appetite, nausea, light-colored stool, dark yellow or brown urine, yellowing skin or eyes, unusual weakness or fatigue Lung injury--shortness of breath or trouble breathing, cough, spitting up blood, chest pain,  fever Pulmonary hypertension--shortness of breath, chest pain, fast or irregular heartbeat, feeling faint or lightheaded, fatigue, swelling of the ankles or feet Stomach pain, bloody diarrhea, pale skin, unusual weakness or fatigue, decrease in the amount of urine, which may be signs of hemolytic uremic syndrome Sudden and severe headache, confusion, change in vision, seizures, which may be signs of posterior reversible encephalopathy syndrome (PRES) TTP--purple spots on the skin or inside the mouth, pale skin, yellowing skin or eyes, unusual weakness or fatigue, fever, fast or irregular heartbeat, confusion, change in vision, trouble speaking, trouble walking Tumor lysis syndrome (TLS)--nausea, vomiting, diarrhea, decrease in the amount of urine, dark urine, unusual weakness or fatigue, confusion, muscle pain or cramps, fast or irregular heartbeat, joint pain Side effects that usually do not require medical attention (report to your care team if they continue or are bothersome): Diarrhea Fatigue Nausea Trouble sleeping This list may not describe all possible side effects. Call your doctor for medical advice about side effects. You may report side effects to FDA at 1-800-FDA-1088. Where should I keep my medication? This medication is given in a hospital or clinic. It will not be stored at home. NOTE: This sheet is a summary. It may not cover all possible information. If you have questions about this medicine, talk to your doctor, pharmacist, or health care provider.  2023 Elsevier/Gold Standard (2022-05-07 00:00:00)  Daratumumab Injection What is this medication? DARATUMUMAB (dar a toom ue mab)  treats multiple myeloma, a type of bone marrow cancer. It works by helping your immune system slow or stop the spread of cancer cells. It is a monoclonal antibody. This medicine may be used for other purposes; ask your health care provider or pharmacist if you have questions. COMMON BRAND NAME(S):  DARZALEX What should I tell my care team before I take this medication? They need to know if you have any of these conditions: Hereditary fructose intolerance Infection, such as chickenpox, herpes, hepatitis B virus Lung or breathing disease, such as asthma, COPD An unusual or allergic reaction to daratumumab, sorbitol, other medications, foods, dyes, or preservatives Pregnant or trying to get pregnant Breast-feeding How should I use this medication? This medication is injected into a vein. It is given by your care team in a hospital or clinic setting. Talk to your care team about the use of this medication in children. Special care may be needed. Overdosage: If you think you have taken too much of this medicine contact a poison control center or emergency room at once. NOTE: This medicine is only for you. Do not share this medicine with others. What if I miss a dose? Keep appointments for follow-up doses. It is important not to miss your dose. Call your care team if you are unable to keep an appointment. What may interact with this medication? Interactions have not been studied. This list may not describe all possible interactions. Give your health care provider a list of all the medicines, herbs, non-prescription drugs, or dietary supplements you use. Also tell them if you smoke, drink alcohol, or use illegal drugs. Some items may interact with your medicine. What should I watch for while using this medication? Your condition will be monitored carefully while you are receiving this medication. This medication can cause serious allergic reactions. To reduce your risk, your care team may give you other medication to take before receiving this one. Be sure to follow the directions from your care team. This medication can affect the results of blood tests to match your blood type. These changes can last for up to 6 months after the final dose. Your care team will do blood tests to match your  blood type before you start treatment. Tell all of your care team that you are being treated with this medication before receiving a blood transfusion. This medication can affect the results of some tests used to determine treatment response; extra tests may be needed to evaluate response. Talk to your care team if you wish to become pregnant or think you are pregnant. This medication can cause serious birth defects if taken during pregnancy and for 3 months after the last dose. A reliable form of contraception is recommended while taking this medication and for 3 months after the last dose. Talk to your care team about effective forms of contraception. Do not breast-feed while taking this medication. What side effects may I notice from receiving this medication? Side effects that you should report to your care team as soon as possible: Allergic reactions--skin rash, itching, hives, swelling of the face, lips, tongue, or throat Infection--fever, chills, cough, sore throat, wounds that don't heal, pain or trouble when passing urine, general feeling of discomfort or being unwell Infusion reactions--chest pain, shortness of breath or trouble breathing, feeling faint or lightheaded Unusual bruising or bleeding Side effects that usually do not require medical attention (report to your care team if they continue or are bothersome): Constipation Diarrhea Fatigue Nausea Pain, tingling, or numbness  in the hands or feet Swelling of the ankles, hands, or feet This list may not describe all possible side effects. Call your doctor for medical advice about side effects. You may report side effects to FDA at 1-800-FDA-1088. Where should I keep my medication? This medication is given in a hospital or clinic. It will not be stored at home. NOTE: This sheet is a summary. It may not cover all possible information. If you have questions about this medicine, talk to your doctor, pharmacist, or health care  provider.  2023 Elsevier/Gold Standard (2014-11-06 00:00:00)       To help prevent nausea and vomiting after your treatment, we encourage you to take your nausea medication as directed.  BELOW ARE SYMPTOMS THAT SHOULD BE REPORTED IMMEDIATELY: *FEVER GREATER THAN 100.4 F (38 C) OR HIGHER *CHILLS OR SWEATING *NAUSEA AND VOMITING THAT IS NOT CONTROLLED WITH YOUR NAUSEA MEDICATION *UNUSUAL SHORTNESS OF BREATH *UNUSUAL BRUISING OR BLEEDING *URINARY PROBLEMS (pain or burning when urinating, or frequent urination) *BOWEL PROBLEMS (unusual diarrhea, constipation, pain near the anus) TENDERNESS IN MOUTH AND THROAT WITH OR WITHOUT PRESENCE OF ULCERS (sore throat, sores in mouth, or a toothache) UNUSUAL RASH, SWELLING OR PAIN  UNUSUAL VAGINAL DISCHARGE OR ITCHING   Items with * indicate a potential emergency and should be followed up as soon as possible or go to the Emergency Department if any problems should occur.  Please show the CHEMOTHERAPY ALERT CARD or IMMUNOTHERAPY ALERT CARD at check-in to the Emergency Department and triage nurse.  Should you have questions after your visit or need to cancel or reschedule your appointment, please contact [facility] [phone]  and follow the prompts.  Office hours are 8:00 a.m. to 4:30 p.m. Monday - Friday. Please note that voicemails left after 4:00 p.m. may not be returned until the following business day.  We are closed weekends and major holidays. You have access to a nurse at all times for urgent questions. Please call the main number to the clinic [phone] and follow the prompts.  For any non-urgent questions, you may also contact your provider using MyChart. We now offer e-Visits for anyone 34 and older to request care online for non-urgent symptoms. For details visit [email or web address].   Also download the MyChart app! Go to the app store, search "MyChart", open the app, select [facility], and log in with your  MyChart username and password.  Masks are optional in the cancer centers. If you would like for your care team to wear a mask while they are taking care of you, please let them know. You may have one support person who is at least 72 years old accompany you for your appointments.

## 2022-09-16 NOTE — Progress Notes (Signed)
Patients port flushed without difficulty.  Good blood return noted with no bruising or swelling noted at site.  Stable during access and blood draw.  Patient to remain accessed for treatment. 

## 2022-09-16 NOTE — Progress Notes (Signed)
Patient presents today for treatment. Vital signs and labs within parameters for treatment. Patient has no complaints of any side effects related to treatment. Patient was held last treatment due to [facility]. Patient states he will start Revlimid today per verbal orders from Dr. Delton Coombes patient's last visit.   Treatment given today per MD orders. Tolerated infusion without adverse affects. Vital signs stable. No complaints at this time. Discharged from clinic ambulatory in stable condition. Alert and oriented x 3. F/U with [facility] as scheduled.

## 2022-09-17 LAB — KAPPA/LAMBDA LIGHT CHAINS
Kappa free light chain: 3.8 mg/L (ref 3.3–19.4)
Kappa, lambda light chain ratio: 1.52 (ref 0.26–1.65)
Lambda free light chains: 2.5 mg/L — ABNORMAL LOW (ref 5.7–26.3)

## 2022-09-18 LAB — PROTEIN ELECTROPHORESIS, SERUM
A/G Ratio: 1.8 — ABNORMAL HIGH (ref 0.7–1.7)
Albumin ELP: 3.4 g/dL (ref 2.9–4.4)
Alpha-1-Globulin: 0.2 g/dL (ref 0.0–0.4)
Alpha-2-Globulin: 0.5 g/dL (ref 0.4–1.0)
Beta Globulin: 0.8 g/dL (ref 0.7–1.3)
Gamma Globulin: 0.3 g/dL — ABNORMAL LOW (ref 0.4–1.8)
Globulin, Total: 1.9 g/dL — ABNORMAL LOW (ref 2.2–3.9)
M-Spike, %: 0.1 g/dL — ABNORMAL HIGH
Total Protein ELP: 5.3 g/dL — ABNORMAL LOW (ref 6.0–8.5)

## 2022-09-22 ENCOUNTER — Other Ambulatory Visit: Payer: Self-pay

## 2022-09-22 DIAGNOSIS — C9000 Multiple myeloma not having achieved remission: Secondary | ICD-10-CM

## 2022-09-22 MED ORDER — LENALIDOMIDE 15 MG PO CAPS: 15.0000 mg | ORAL_CAPSULE | Freq: Every day | ORAL | 0 refills | Status: DC

## 2022-09-22 NOTE — Telephone Encounter (Signed)
Chart reviewed. Revlimid refilled per last office note with Dr. Katragadda.  

## 2022-09-23 ENCOUNTER — Ambulatory Visit: Payer: Medicare HMO | Admitting: Hematology

## 2022-09-23 ENCOUNTER — Other Ambulatory Visit: Payer: Medicare HMO

## 2022-09-23 ENCOUNTER — Ambulatory Visit: Payer: Medicare HMO

## 2022-09-30 ENCOUNTER — Inpatient Hospital Stay: Payer: Medicare HMO

## 2022-09-30 ENCOUNTER — Inpatient Hospital Stay (HOSPITAL_BASED_OUTPATIENT_CLINIC_OR_DEPARTMENT_OTHER): Payer: Medicare HMO | Admitting: Hematology

## 2022-09-30 ENCOUNTER — Other Ambulatory Visit: Payer: Medicare HMO

## 2022-09-30 ENCOUNTER — Inpatient Hospital Stay: Payer: Medicare HMO | Attending: Hematology

## 2022-09-30 ENCOUNTER — Ambulatory Visit: Payer: Medicare HMO

## 2022-09-30 VITALS — BP 122/63 | HR 56 | Temp 97.7°F | Resp 17

## 2022-09-30 DIAGNOSIS — C61 Malignant neoplasm of prostate: Secondary | ICD-10-CM

## 2022-09-30 DIAGNOSIS — C9002 Multiple myeloma in relapse: Secondary | ICD-10-CM | POA: Diagnosis present

## 2022-09-30 DIAGNOSIS — Z5111 Encounter for antineoplastic chemotherapy: Secondary | ICD-10-CM | POA: Diagnosis present

## 2022-09-30 DIAGNOSIS — Z5112 Encounter for antineoplastic immunotherapy: Secondary | ICD-10-CM | POA: Diagnosis present

## 2022-09-30 DIAGNOSIS — C9000 Multiple myeloma not having achieved remission: Secondary | ICD-10-CM

## 2022-09-30 DIAGNOSIS — Z79899 Other long term (current) drug therapy: Secondary | ICD-10-CM | POA: Insufficient documentation

## 2022-09-30 LAB — CBC WITH DIFFERENTIAL/PLATELET
Abs Immature Granulocytes: 0.02 10*3/uL (ref 0.00–0.07)
Basophils Absolute: 0 10*3/uL (ref 0.0–0.1)
Basophils Relative: 0 %
Eosinophils Absolute: 0.3 10*3/uL (ref 0.0–0.5)
Eosinophils Relative: 7 %
HCT: 31.1 % — ABNORMAL LOW (ref 39.0–52.0)
Hemoglobin: 10.5 g/dL — ABNORMAL LOW (ref 13.0–17.0)
Immature Granulocytes: 0 %
Lymphocytes Relative: 16 %
Lymphs Abs: 0.7 10*3/uL (ref 0.7–4.0)
MCH: 31.9 pg (ref 26.0–34.0)
MCHC: 33.8 g/dL (ref 30.0–36.0)
MCV: 94.5 fL (ref 80.0–100.0)
Monocytes Absolute: 0.5 10*3/uL (ref 0.1–1.0)
Monocytes Relative: 11 %
Neutro Abs: 3.1 10*3/uL (ref 1.7–7.7)
Neutrophils Relative %: 66 %
Platelets: 158 10*3/uL (ref 150–400)
RBC: 3.29 MIL/uL — ABNORMAL LOW (ref 4.22–5.81)
RDW: 16.4 % — ABNORMAL HIGH (ref 11.5–15.5)
WBC: 4.7 10*3/uL (ref 4.0–10.5)
nRBC: 0 % (ref 0.0–0.2)

## 2022-09-30 LAB — COMPREHENSIVE METABOLIC PANEL
ALT: 39 U/L (ref 0–44)
AST: 29 U/L (ref 15–41)
Albumin: 3.5 g/dL (ref 3.5–5.0)
Alkaline Phosphatase: 69 U/L (ref 38–126)
Anion gap: 7 (ref 5–15)
BUN: 22 mg/dL (ref 8–23)
CO2: 26 mmol/L (ref 22–32)
Calcium: 8.9 mg/dL (ref 8.9–10.3)
Chloride: 108 mmol/L (ref 98–111)
Creatinine, Ser: 1.17 mg/dL (ref 0.61–1.24)
GFR, Estimated: >60 mL/min (ref >60)
Glucose, Bld: 104 mg/dL — ABNORMAL HIGH (ref 70–99)
Potassium: 3.4 mmol/L — ABNORMAL LOW (ref 3.5–5.1)
Sodium: 141 mmol/L (ref 135–145)
Total Bilirubin: 0.5 mg/dL (ref 0.3–1.2)
Total Protein: 5.6 g/dL — ABNORMAL LOW (ref 6.5–8.1)

## 2022-09-30 LAB — MAGNESIUM: Magnesium: 1.9 mg/dL (ref 1.7–2.4)

## 2022-09-30 LAB — PSA: Prostatic Specific Antigen: 2.05 ng/mL (ref 0.00–4.00)

## 2022-09-30 MED ORDER — DENOSUMAB 120 MG/1.7ML ~~LOC~~ SOLN
120.0000 mg | Freq: Once | SUBCUTANEOUS | Status: AC
  Administered 2022-09-30: 120 mg via SUBCUTANEOUS
  Filled 2022-09-30: qty 1.7

## 2022-09-30 MED ORDER — SODIUM CHLORIDE 0.9 % IV SOLN: Freq: Once | INTRAVENOUS | Status: AC

## 2022-09-30 MED ORDER — SODIUM CHLORIDE 0.9 % IV SOLN
20.0000 mg | Freq: Once | INTRAVENOUS | Status: AC
  Administered 2022-09-30: 20 mg via INTRAVENOUS
  Filled 2022-09-30: qty 2

## 2022-09-30 MED ORDER — ACETAMINOPHEN 325 MG PO TABS
650.0000 mg | ORAL_TABLET | Freq: Once | ORAL | Status: AC
  Administered 2022-09-30: 650 mg via ORAL
  Filled 2022-09-30: qty 2

## 2022-09-30 MED ORDER — DEXTROSE 5 % IV SOLN
56.0000 mg/m2 | Freq: Once | INTRAVENOUS | Status: AC
  Administered 2022-09-30: 110 mg via INTRAVENOUS
  Filled 2022-09-30: qty 15

## 2022-09-30 MED ORDER — DARATUMUMAB-HYALURONIDASE-FIHJ 1800-30000 MG-UT/15ML ~~LOC~~ SOLN
1800.0000 mg | Freq: Once | SUBCUTANEOUS | Status: AC
  Administered 2022-09-30: 1800 mg via SUBCUTANEOUS
  Filled 2022-09-30: qty 15

## 2022-09-30 MED ORDER — DIPHENHYDRAMINE HCL 25 MG PO CAPS
50.0000 mg | ORAL_CAPSULE | Freq: Once | ORAL | Status: AC
  Administered 2022-09-30: 50 mg via ORAL
  Filled 2022-09-30: qty 2

## 2022-09-30 MED ORDER — HEPARIN SOD (PORK) LOCK FLUSH 100 UNIT/ML IV SOLN
500.0000 [IU] | Freq: Once | INTRAVENOUS | Status: AC | PRN
  Administered 2022-09-30: 500 [IU]

## 2022-09-30 MED ORDER — SODIUM CHLORIDE 0.9% FLUSH
10.0000 mL | INTRAVENOUS | Status: DC | PRN
  Administered 2022-09-30: 10 mL

## 2022-09-30 MED ORDER — DEGARELIX ACETATE 80 MG ~~LOC~~ SOLR
80.0000 mg | Freq: Once | SUBCUTANEOUS | Status: AC
  Administered 2022-09-30: 80 mg via SUBCUTANEOUS
  Filled 2022-09-30: qty 4

## 2022-09-30 NOTE — Progress Notes (Signed)
Pt presents today for Kyprolis, Dara SQ, Delton See and [place] per proivider's order. Vital signs and labs WNL for treatment today. Okay to proceed with treatment today per Dr.K.  Pt's Calcium was 8.9 today, pt denies tooth or jaw pain. No recent or future dental appointments at this time. Pt reports taking Calcium and Vit D supplements as directed.  Kyprolis. Dara SQ, Deatra Ina given today per MD orders. Tolerated infusion without adverse affects. Vital signs stable. No complaints at this time. Discharged from clinic ambulatory in stable condition. Alert and oriented x 3. F/U with [facility] as scheduled.

## 2022-09-30 NOTE — Patient Instructions (Signed)
[facility]  Discharge Instructions: Thank you for choosing [facility] to provide your oncology and hematology care.  If you have a lab appointment with the [facility], please come in thru the Main Entrance and check in at the main information desk.  Wear comfortable clothing and clothing appropriate for easy access to any Portacath or PICC line.   We strive to give you quality time with your provider. You may need to reschedule your appointment if you arrive late (15 or more minutes).  Arriving late affects you and other patients whose appointments are after yours.  Also, if you miss three or more appointments without notifying the office, you may be dismissed from the clinic at the provider's discretion.      For prescription refill requests, have your pharmacy contact our office and allow 72 hours for refills to be completed.    Today you received the following chemotherapy and/or immunotherapy agents Kyprolis, Dara SQ, [place] Koller, and [place].   To help prevent nausea and vomiting after your treatment, we encourage you to take your nausea medication as directed.  Carfilzomib Injection What is this medication? CARFILZOMIB (kar FILZ oh mib) treats multiple myeloma, a type of bone marrow cancer. It works by blocking a protein that causes cancer cells to grow and multiply. This helps to slow or stop the spread of cancer cells. This medicine may be used for other purposes; ask your health care provider or pharmacist if you have questions. COMMON BRAND NAME(S): KYPROLIS What should I tell my care team before I take this medication? They need to know if you have any of these conditions: Heart disease History of blood clots Irregular heartbeat Kidney disease Liver disease Lung or breathing disease An unusual or allergic reaction to carfilzomib, or other medications, foods, dyes, or preservatives If you or your partner are pregnant or trying to get  pregnant Breastfeeding How should I use this medication? This medication is injected into a vein. It is given by your care team in a hospital or clinic setting. Talk to your care team about the use of this medication in children. Special care may be needed. Overdosage: If you think you have taken too much of this medicine contact a poison control center or emergency room at once. NOTE: This medicine is only for you. Do not share this medicine with others. What if I miss a dose? Keep appointments for follow-up doses. It is important not to miss your dose. Call your care team if you are unable to keep an appointment. What may interact with this medication? Interactions are not expected. This list may not describe all possible interactions. Give your health care provider a list of all the medicines, herbs, non-prescription drugs, or dietary supplements you use. Also tell them if you smoke, drink alcohol, or use illegal drugs. Some items may interact with your medicine. What should I watch for while using this medication? Your condition will be monitored carefully while you are receiving this medication. You may need blood work while taking this medication. Check with your care team if you have severe diarrhea, nausea, and vomiting, or if you sweat a lot. The loss of too much body fluid may make it dangerous for you to take this medication. This medication may affect your coordination, reaction time, or judgment. Do not drive or operate machinery until you know how this medication affects you. Sit up or stand slowly to reduce the risk of dizzy or fainting spells. Drinking  alcohol with this medication can increase the risk of these side effects. Talk to your care team if you may be pregnant. Serious birth defects can occur if you take this medication during pregnancy and for 6 months after the last dose. You will need a negative pregnancy test before starting this medication. Contraception is recommended  while taking this medication and for 6 months after the last dose. Your care team can help you find an option that works for you. If your partner can get pregnant, use a condom during sex while taking this medication and for 3 months after the last dose. Do not breastfeed while taking this medication and for 2 weeks after the last dose. This medication may cause infertility. Talk to your care team if you are concerned about your fertility. What side effects may I notice from receiving this medication? Side effects that you should report to your care team as soon as possible: Allergic reactions--skin rash, itching, hives, swelling of the face, lips, tongue, or throat Bleeding--bloody or black, tar-like stools, vomiting blood or brown material that looks like coffee grounds, red or dark brown urine, small red or purple spots on skin, unusual bruising or bleeding Blood clot--pain, swelling, or warmth in the leg, shortness of breath, chest pain Dizziness, loss of balance or coordination, confusion or trouble speaking Heart attack--pain or tightness in the chest, shoulders, arms, or jaw, nausea, shortness of breath, cold or clammy skin, feeling faint or lightheaded Heart failure--shortness of breath, swelling of the ankles, feet, or hands, sudden weight gain, unusual weakness or fatigue Heart rhythm changes--fast or irregular heartbeat, dizziness, feeling faint or lightheaded, chest pain, trouble breathing Increase in blood pressure Infection--fever, chills, cough, sore throat, wounds that don't heal, pain or trouble when passing urine, general feeling of discomfort or being unwell Infusion reactions--chest pain, shortness of breath or trouble breathing, feeling faint or lightheaded Kidney injury--decrease in the amount of urine, swelling of the ankles, hands, or feet Liver injury--right upper belly pain, loss of appetite, nausea, light-colored stool, dark yellow or brown urine, yellowing skin or eyes,  unusual weakness or fatigue Lung injury--shortness of breath or trouble breathing, cough, spitting up blood, chest pain, fever Pulmonary hypertension--shortness of breath, chest pain, fast or irregular heartbeat, feeling faint or lightheaded, fatigue, swelling of the ankles or feet Stomach pain, bloody diarrhea, pale skin, unusual weakness or fatigue, decrease in the amount of urine, which may be signs of hemolytic uremic syndrome Sudden and severe headache, confusion, change in vision, seizures, which may be signs of posterior reversible encephalopathy syndrome (PRES) TTP--purple spots on the skin or inside the mouth, pale skin, yellowing skin or eyes, unusual weakness or fatigue, fever, fast or irregular heartbeat, confusion, change in vision, trouble speaking, trouble walking Tumor lysis syndrome (TLS)--nausea, vomiting, diarrhea, decrease in the amount of urine, dark urine, unusual weakness or fatigue, confusion, muscle pain or cramps, fast or irregular heartbeat, joint pain Side effects that usually do not require medical attention (report to your care team if they continue or are bothersome): Diarrhea Fatigue Nausea Trouble sleeping This list may not describe all possible side effects. Call your doctor for medical advice about side effects. You may report side effects to FDA at 1-800-FDA-1088. Where should I keep my medication? This medication is given in a hospital or clinic. It will not be stored at home. NOTE: This sheet is a summary. It may not cover all possible information. If you have questions about this medicine, talk to your doctor, pharmacist,  or health care provider.  2023 Elsevier/Gold Standard (2022-05-07 00:00:00)  Daratumumab Injection What is this medication? DARATUMUMAB (dar a toom ue mab) treats multiple myeloma, a type of bone marrow cancer. It works by helping your immune system slow or stop the spread of cancer cells. It is a monoclonal antibody. This medicine may be  used for other purposes; ask your health care provider or pharmacist if you have questions. COMMON BRAND NAME(S): DARZALEX What should I tell my care team before I take this medication? They need to know if you have any of these conditions: Hereditary fructose intolerance Infection, such as chickenpox, herpes, hepatitis B virus Lung or breathing disease, such as asthma, COPD An unusual or allergic reaction to daratumumab, sorbitol, other medications, foods, dyes, or preservatives Pregnant or trying to get pregnant Breast-feeding How should I use this medication? This medication is injected into a vein. It is given by your care team in a hospital or clinic setting. Talk to your care team about the use of this medication in children. Special care may be needed. Overdosage: If you think you have taken too much of this medicine contact a poison control center or emergency room at once. NOTE: This medicine is only for you. Do not share this medicine with others. What if I miss a dose? Keep appointments for follow-up doses. It is important not to miss your dose. Call your care team if you are unable to keep an appointment. What may interact with this medication? Interactions have not been studied. This list may not describe all possible interactions. Give your health care provider a list of all the medicines, herbs, non-prescription drugs, or dietary supplements you use. Also tell them if you smoke, drink alcohol, or use illegal drugs. Some items may interact with your medicine. What should I watch for while using this medication? Your condition will be monitored carefully while you are receiving this medication. This medication can cause serious allergic reactions. To reduce your risk, your care team may give you other medication to take before receiving this one. Be sure to follow the directions from your care team. This medication can affect the results of blood tests to match your blood type.  These changes can last for up to 6 months after the final dose. Your care team will do blood tests to match your blood type before you start treatment. Tell all of your care team that you are being treated with this medication before receiving a blood transfusion. This medication can affect the results of some tests used to determine treatment response; extra tests may be needed to evaluate response. Talk to your care team if you wish to become pregnant or think you are pregnant. This medication can cause serious birth defects if taken during pregnancy and for 3 months after the last dose. A reliable form of contraception is recommended while taking this medication and for 3 months after the last dose. Talk to your care team about effective forms of contraception. Do not breast-feed while taking this medication. What side effects may I notice from receiving this medication? Side effects that you should report to your care team as soon as possible: Allergic reactions--skin rash, itching, hives, swelling of the face, lips, tongue, or throat Infection--fever, chills, cough, sore throat, wounds that don't heal, pain or trouble when passing urine, general feeling of discomfort or being unwell Infusion reactions--chest pain, shortness of breath or trouble breathing, feeling faint or lightheaded Unusual bruising or bleeding Side effects that usually do  not require medical attention (report to your care team if they continue or are bothersome): Constipation Diarrhea Fatigue Nausea Pain, tingling, or numbness in the hands or feet Swelling of the ankles, hands, or feet This list may not describe all possible side effects. Call your doctor for medical advice about side effects. You may report side effects to FDA at 1-800-FDA-1088. Where should I keep my medication? This medication is given in a hospital or clinic. It will not be stored at home. NOTE: This sheet is a summary. It may not cover all possible  information. If you have questions about this medicine, talk to your doctor, pharmacist, or health care provider.  2023 Elsevier/Gold Standard (2014-11-06 00:00:00)    BELOW ARE SYMPTOMS THAT SHOULD BE REPORTED IMMEDIATELY: *FEVER GREATER THAN 100.4 F (38 C) OR HIGHER *CHILLS OR SWEATING *NAUSEA AND VOMITING THAT IS NOT CONTROLLED WITH YOUR NAUSEA MEDICATION *UNUSUAL SHORTNESS OF BREATH *UNUSUAL BRUISING OR BLEEDING *URINARY PROBLEMS (pain or burning when urinating, or frequent urination) *BOWEL PROBLEMS (unusual diarrhea, constipation, pain near the anus) TENDERNESS IN MOUTH AND THROAT WITH OR WITHOUT PRESENCE OF ULCERS (sore throat, sores in mouth, or a toothache) UNUSUAL RASH, SWELLING OR PAIN  UNUSUAL VAGINAL DISCHARGE OR ITCHING   Items with * indicate a potential emergency and should be followed up as soon as possible or go to the Emergency Department if any problems should occur.  Please show the CHEMOTHERAPY ALERT CARD or IMMUNOTHERAPY ALERT CARD at check-in to the Emergency Department and triage nurse.  Should you have questions after your visit or need to cancel or reschedule your appointment, please contact [facility] [phone]  and follow the prompts.  Office hours are 8:00 a.m. to 4:30 p.m. Monday - Friday. Please note that voicemails left after 4:00 p.m. may not be returned until the following business day.  We are closed weekends and major holidays. You have access to a nurse at all times for urgent questions. Please call the main number to the clinic [phone] and follow the prompts.  For any non-urgent questions, you may also contact your provider using MyChart. We now offer e-Visits for anyone 52 and older to request care online for non-urgent symptoms. For details visit [email or web address].   Also download the MyChart app! Go to the app store, search "MyChart", open the app, select [facility], and log in with your MyChart username and  password.  Masks are optional in the cancer centers. If you would like for your care team to wear a mask while they are taking care of you, please let them know. You may have one support person who is at least 72 years old accompany you for your appointments.

## 2022-09-30 NOTE — Progress Notes (Signed)
Received message from Dr Delton Coombes to add:  Darzalex Faspro SQ x 1 to cycle 7 day 15 and  Firmagon 80 mg Subq back into [facility] plan.  T.O.  Dr Rhys Martini, PharmD

## 2022-09-30 NOTE — Progress Notes (Signed)
Patient has been examined by Dr. Katragadda, and vital signs and labs have been reviewed. ANC, Creatinine, LFTs, hemoglobin, and platelets are within treatment parameters per M.D. - pt may proceed with treatment.  Primary RN and pharmacy notified.  

## 2022-09-30 NOTE — Patient Instructions (Addendum)
[facility] at [facility] Discharge Instructions   You were seen and examined today by Dr. Delton Coombes.  He reviewed the results of your lab work which are normal/stable. We sent a daratumumab-specific immunofixation test today. Those results are pending.  We will proceed with your treatment today.  Return as scheduled.    Thank you for choosing [facility] at [facility] to provide your oncology and hematology care.  To afford each patient quality time with our provider, please arrive at least 15 minutes before your scheduled appointment time.   If you have a lab appointment with the [facility] please come in thru the Main Entrance and check in at the main information desk.  You need to re-schedule your appointment should you arrive 10 or more minutes late.  We strive to give you quality time with our providers, and arriving late affects you and other patients whose appointments are after yours.  Also, if you no show three or more times for appointments you may be dismissed from the clinic at the providers discretion.     Again, thank you for choosing [facility].  Our hope is that these requests will decrease the amount of time that you wait before being seen by our physicians.       _____________________________________________________________  Should you have questions after your visit to [facility], please contact our office at [phone] and follow the prompts.  Our office hours are 8:00 a.m. and 4:30 p.m. Monday - Friday.  Please note that voicemails left after 4:00 p.m. may not be returned until the following business day.  We are closed weekends and major holidays.  You do have access to a nurse 24-7, just call the main number to the clinic [phone] and do not press any options, hold on the line and a nurse will answer the phone.    For prescription refill requests, have your pharmacy contact our office  and allow 72 hours.    Due to Covid, you will need to wear a mask upon entering the hospital. If you do not have a mask, a mask will be given to you at the Main Entrance upon arrival. For doctor visits, patients may have 1 support person age 86 or older with them. For treatment visits, patients can not have anyone with them due to social distancing guidelines and our immunocompromised population.

## 2022-09-30 NOTE — Progress Notes (Signed)
[name], [place] [address]   CLINIC:  Medical Oncology/Hematology  PCP:  Lindell Spar, MD [address] / [place] [address] [phone]   REASON FOR VISIT:  Follow-up for prostate cancer and multiple myeloma  PRIOR THERAPY: none  NGS Results: not done  CURRENT THERAPY: Carfilzomib (20/70) + Daratumumab SQ + Dexamethasone (20/40) DaraKRd q28d  BRIEF ONCOLOGIC HISTORY:  Oncology History  Multiple myeloma ([place])  04/09/2022 Initial Diagnosis   Multiple myeloma ([facility])   04/15/2022 -  Chemotherapy   Patient is on Treatment Plan : MYELOMA RELAPSED/REFRACTORY Carfilzomib (20/70) + Daratumumab SQ + Dexamethasone (20/40) DaraKd q28d       CANCER STAGING:  Cancer Staging  Multiple myeloma ([place]) Staging form: Plasma Cell Myeloma and Plasma Cell Disorders, AJCC 8th Edition - Clinical: No stage assigned - Unsigned   INTERVAL HISTORY:  Luis Stewart, a 71 y.o. male, seen for follow-up of multiple myeloma and prostate cancer.  He reports that he started having left groin aching pain and woke up with it yesterday.  He does not require any pain medication for it.  He was evaluated by his dentist.  He has diarrhea on and off which is also stable.  He has some tingling in the hands and feet which is stable.  Energy levels are 75%.  No infections reported.  REVIEW OF SYSTEMS:  Review of Systems  Gastrointestinal:  Positive for diarrhea.  Neurological:  Positive for numbness (hands and feet tingling).  Psychiatric/Behavioral:  Positive for sleep disturbance.   All other systems reviewed and are negative.   PAST MEDICAL/SURGICAL HISTORY:  Past Medical History:  Diagnosis Date   Arthritis    Asbestosis ([place])    Closed fracture of distal end of fibula with tibia with routine healing 05/28/2020   COVID-19 09/03/2021   Cyst of kidney, acquired    Headache    History of fracture of leg    Right, childhood   Hx of migraine  headaches    Hyperlipidemia    Neuropathy    PE (pulmonary thromboembolism) ([place])    After knee surgery   Prostate cancer ([place]) 2015   Adenocarcinoma by biopsy   Past Surgical History:  Procedure Laterality Date   BIOPSY PROSTATE  2003 and 2005   COLONOSCOPY  11/28/2008   Dr. Rourk:internal hemorrhoids/tortus colon/ascending colon polyps, tubular adenomas   COLONOSCOPY N/A 09/04/2014   Procedure: COLONOSCOPY;  Surgeon: Daneil Dolin, MD;  [facility];  Service: Endoscopy;  Laterality: N/A;  9:30   COLONOSCOPY N/A 07/17/2017   Procedure: COLONOSCOPY;  Surgeon: Danie Binder, MD;  [facility];  Service: Endoscopy;  Laterality: N/A;   ESOPHAGOGASTRODUODENOSCOPY N/A 07/16/2017   Procedure: ESOPHAGOGASTRODUODENOSCOPY (EGD);  Surgeon: Danie Binder, MD;  [facility];  Service: Endoscopy;  Laterality: N/A;   GIVENS CAPSULE STUDY  07/16/2017   Procedure: GIVENS CAPSULE STUDY;  Surgeon: Danie Binder, MD;  [facility];  Service: Endoscopy;;   JOINT REPLACEMENT N/A    Phreesia 01/12/2021   POLYPECTOMY  07/17/2017   Procedure: POLYPECTOMY;  Surgeon: Danie Binder, MD;  [facility];  Service: Endoscopy;;  cecal   PORTACATH PLACEMENT Left 04/18/2022   Procedure: INSERTION PORT-A-CATH;  Surgeon: Aviva Signs, MD;  Location: [facility];  Service: General;  Laterality: Left;   TOTAL KNEE ARTHROPLASTY Right 06/10/2017   TOTAL KNEE ARTHROPLASTY Right 06/10/2017   Procedure: TOTAL KNEE  ARTHROPLASTY;  Surgeon: [name], MD;  Location: [facility];  Service: Orthopedics;  Laterality: Right;    SOCIAL HISTORY:  Social History   Socioeconomic History   Marital status: Married    Spouse name: Not on file   Number of children: Not on file   Years of education: Not on file   Highest education level: Not on file  Occupational History   Occupation: Retired  Tobacco Use   Smoking status: Never   Smokeless tobacco: Never  Vaping Use    Vaping Use: Never used  Substance and Sexual Activity   Alcohol use: No   Drug use: No   Sexual activity: Yes  Other Topics Concern   Not on file  Social History Narrative   Not on file   Social Determinants of Health   Financial Resource Strain: Not on file  Food Insecurity: Not on file  Transportation Needs: No Transportation Needs (02/03/2022)   PRAPARE - Hydrologist (Medical): No    Lack of Transportation (Non-Medical): No  Physical Activity: Sufficiently Active (02/03/2022)   Exercise Vital Sign    Days of Exercise per Week: 4 days    Minutes of Exercise per Session: 60 min  Stress: Not on file  Social Connections: Moderately Integrated (02/03/2022)   Social Connection and Isolation Panel [NHANES]    Frequency of Communication with Friends and Family: More than three times a week    Frequency of Social Gatherings with Friends and Family: More than three times a week    Attends Religious Services: More than 4 times per year    Active Member of [employer] or Organizations: No    Attends Archivist Meetings: Never    Marital Status: Married  Human resources officer Violence: Not At Risk (02/03/2022)   Humiliation, Afraid, Rape, and Kick questionnaire    Fear of Current or Ex-Partner: No    Emotionally Abused: No    Physically Abused: No    Sexually Abused: No    FAMILY HISTORY:  Family History  Problem Relation Age of Onset   Diabetes Mother    Hypertension Mother    Obesity Mother    Heart disease Mother    Mental illness Mother    Hypertension Sister    Obesity Sister    Arthritis Sister    Deep vein thrombosis Father    Dementia Father    Heart disease Father        CABG   Colon cancer Neg Hx     CURRENT MEDICATIONS:  Current Outpatient Medications  Medication Sig Dispense Refill   acetaminophen (TYLENOL) 500 MG tablet Take 1,000 mg by mouth every 6 (six) hours as needed for mild pain or moderate pain.     acyclovir (ZOVIRAX)  400 MG tablet Take 1 tablet (400 mg total) by mouth 2 (two) times daily. 60 tablet 6   aspirin EC 81 MG tablet Take 81 mg by mouth daily. Swallow whole.     benzonatate (TESSALON) 100 MG capsule Take 1 capsule (100 mg total) by mouth 2 (two) times daily as needed for cough. 20 capsule 0   Carfilzomib (KYPROLIS IV) Inject into the vein once a week.     Cholecalciferol (VITAMIN D) 2000 units CAPS Take 2,000 Units by mouth every other day.     daratumumab-hyaluronidase-fihj (DARZALEX FASPRO) 1800-30000 MG-UT/15ML SOLN Inject 1,800 mg into the skin once a week.     lenalidomide (REVLIMID) 15 MG capsule Take 1 capsule (  15 mg total) by mouth daily. Take for 21 days, then hold for 7 days. Repeat every 28 days. 21 capsule 0   lidocaine-prilocaine (EMLA) cream Apply a small amount to port a cath site and cover with plastic wrap (do not rub in) 1 hour prior to infusion appointments 30 g 0   methocarbamol (ROBAXIN) 500 MG tablet Take 1 tablet (500 mg total) by mouth 3 (three) times daily. 60 tablet 1   Multiple Vitamin (MULTIVITAMIN WITH MINERALS) TABS tablet Take 1 tablet by mouth daily with breakfast. Centrum Silver for Men     nirmatrelvir & ritonavir (PAXLOVID, 300/100,) 20 x 150 MG & 10 x 100MG TBPK Take pack as directed.  Do not take Revlamid while on therapy and resume day after last dose. 1 each 0   nitroGLYCERIN (NITROSTAT) 0.4 MG SL tablet Place 1 tablet (0.4 mg total) under the tongue every 5 (five) minutes as needed for chest pain. 30 tablet 0   pantoprazole (PROTONIX) 40 MG tablet TAKE 1 TABLET BY MOUTH EVERY OTHER DAY 45 tablet 1   prochlorperazine (COMPAZINE) 10 MG tablet Take 1 tablet (10 mg total) by mouth every 6 (six) hours as needed for nausea or vomiting. 30 tablet 3   tamsulosin (FLOMAX) 0.4 MG CAPS capsule Take 1 capsule (0.4 mg total) by mouth daily. 30 capsule 11   traMADol (ULTRAM) 50 MG tablet Take 1 tablet (50 mg total) by mouth every 6 (six) hours as needed. 10 tablet 0   No  current facility-administered medications for this visit.   Facility-Administered Medications Ordered in Other Visits  Medication Dose Route Frequency Provider Last Rate Last Admin   acetaminophen (TYLENOL) 325 MG tablet            diphenhydrAMINE (BENADRYL) 25 mg capsule             ALLERGIES:  Allergies  Allergen Reactions   Sulfa Antibiotics Other (See Comments)    Unknown reaction. Childhood reaction    PHYSICAL EXAM:  Performance status (ECOG): 1 - Symptomatic but completely ambulatory  There were no vitals filed for this visit.  Wt Readings from Last 3 Encounters:  09/30/22 189 lb 11.2 oz (86 kg)  09/16/22 186 lb 12.8 oz (84.7 kg)  09/09/22 185 lb 12.8 oz (84.3 kg)   Physical Exam Vitals reviewed.  Constitutional:      Appearance: Normal appearance.  Cardiovascular:     Rate and Rhythm: Normal rate and regular rhythm.     Pulses: Normal pulses.     Heart sounds: Normal heart sounds.  Pulmonary:     Effort: Pulmonary effort is normal.     Breath sounds: Normal breath sounds.  Musculoskeletal:     Right lower leg: No edema.     Left lower leg: No edema.  Neurological:     General: No focal deficit present.     Mental Status: He is alert and oriented to person, place, and time.  Psychiatric:        Mood and Affect: Mood normal.        Behavior: Behavior normal.    LABORATORY DATA:  I have reviewed the labs as listed.     Latest Ref Rng & Units 09/30/2022    8:26 AM 09/16/2022    8:55 AM 09/09/2022    9:26 AM  CBC  WBC 4.0 - 10.5 K/uL 4.7  3.4  3.1   Hemoglobin 13.0 - 17.0 g/dL 10.5  10.8  10.4   Hematocrit 39.0 -  52.0 % 31.1  32.1  31.0   Platelets 150 - 400 K/uL 158  210  126       Latest Ref Rng & Units 09/16/2022    8:55 AM 09/09/2022    9:26 AM 09/02/2022    9:36 AM  CMP  Glucose 70 - 99 mg/dL 103  95  95   BUN 8 - 23 mg/dL '20  26  17   ' Creatinine 0.61 - 1.24 mg/dL 1.17  1.22  1.16   Sodium 135 - 145 mmol/L 141  139  142   Potassium 3.5 - 5.1  mmol/L 3.8  4.0  3.9   Chloride 98 - 111 mmol/L 108  107  109   CO2 22 - 32 mmol/L '26  26  28   ' Calcium 8.9 - 10.3 mg/dL 8.9  8.7  8.9   Total Protein 6.5 - 8.1 g/dL 5.9  5.6  5.6   Total Bilirubin 0.3 - 1.2 mg/dL 0.8  0.5  0.6   Alkaline Phos 38 - 126 U/L 72  63  60   AST 15 - 41 U/L '21  18  18   ' ALT 0 - 44 U/L '23  20  17     ' DIAGNOSTIC IMAGING:  I have independently reviewed the scans and discussed with the patient. No results found.   ASSESSMENT:  Prostate cancer: - TRUS/Bx on 06/14/2015: 1/12 cores positive for adenocarcinoma-10% of the core in the right lateral base revealed Gleason 3+3=6.  Prostate volume 175 cc.  PSA was 16.6.  PSA density 0.09. - Seen by Dr. Louis Meckel in [facility] for robotic prostatectomy.  Due to low-volume prostate cancer and very low PSA density, it was recommended that he continue active surveillance. - 01/21/2016: Surveillance TRUS/biopsy, following a prostatic MRI which revealed no suspicious prostatic lesions and correlated the patient's large prostate.  All 12 cores were negative for prostate cancer.  Prostate volume was 190 mL. - 08/11/2018: Fusion TRUS/biopsy: Recent prostate MRI-volume 220 mL, no significant lesions.  No evidence of transcapsular/.  Ureteral, SV/bony or lymphatic disease.  Path-1 core (left apex) revealed Gleason 3+3 and 5% of core. - PSA: 17.16 (10/09/2015), 15.4 (11/04/2016), 17.9 (05/15/2017), 18.4 (04/19/2018), 15.9 (02/15/2019), 15.5 (03/20/2020), 22.4 (03/20/2021) - MRI of the lumbar spine with and without contrast on 11/27/2021 done for back pain showed expanded appearance of the sacrum with diffusely abnormal bone marrow signal and contrast-enhancement concerning for metastatic disease. - CT pelvis with contrast on 11/27/2021: Expansile, heterogeneous trabeculated appearance of the sacrum with thickening of the cortex, unchanged from multiple prior exams.  No evidence of pelvic lymphadenopathy.  Extreme prostatomegaly. -PSMA PET scan  (03/13/2022): Markedly enlarged prostate gland with focal activity inferior left aspect of the gland suggesting prostatic adenocarcinoma.  No nodal metastasis in the abdomen or pelvis.  Multifocal skeletal metastasis in the mid sacrum, right sacral ala, subtle lesions in the L2 and several rib lesions. - Degarelix started on 04/15/2022   Social/family history: - Lives at home with his wife.  Retired in 2003 from [employer].  He works part-time work at an [employer].  Non-smoker. - Sister had cholangiocarcinoma.  Paternal first cousin had metastatic cancer.  Paternal uncle had prostate cancer.  3.  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  Core biopsy demonstrates hypercellular marrow (95%) with large atypical plasma cells arranged in sheets, by CD138 IHC plasma cells comprise 70%  of the cellular elements, kappa restricted.  Amyloid was negative. 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  Chronic progressive changes involving the sacrum, left iliac bone including accentuation of trabecular markings and cortical thickening with low FDG uptake suggestive of chronic Paget's disease.  Marked prostate gland enlargement. - BMBX (03/20/2022):Hypercellular bone marrow with at least 25% plasma cells on aspirate with atypical features.  Core biopsy demonstrates hypercellular marrow for age (95%) with large atypical plasma cells arranged in sheets.  By CD138 IHC, plasma cells comprise approximately 70% of cellular elements and are kappa restricted.  Amyloid deposition was negative by Congo red.  Karyotype showed 20 q. deletion.  Myeloma FISH panel showed complex abnormalities. 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  PSA today has improved to 2.05 from 6.28 on 08/26/2022.  Continue monthly degarelix.  2.  IgG kappa multiple myeloma with complex cytogenetics: - He is tolerating Revlimid 15 mg 3 weeks on/1 week off very well.  Myeloma labs from 09/16/2022 shows M spike is 0.1 g.  Free light chain ratio is normal at 1.52.  Kappa light chains are 3.8.  Daratumumab specific immunofixation is pending.  Labs today shows normal LFTs.  CBC was grossly normal.  Proceed with Darzalex and carfilzomib today.  He has an appointment with Dr. Feliciana Rossetti on 10/09/2022.  RTC 4 weeks for follow-up with labs.  3.  Normocytic anemia: - Hemoglobin is 10.5 today from myelosuppression.  4.  Osteopenia (DEXA scan 06/04/2022: T score -2.1): - He was evaluated by his dentist and cleared for denosumab.       -He will start denosumab today.   Orders placed this encounter:  Orders Placed This Encounter  Procedures   Miscellaneous [facility] test (send-out)      [name], MD [facility] [phone]

## 2022-10-06 ENCOUNTER — Other Ambulatory Visit (HOSPITAL_COMMUNITY): Payer: Self-pay | Admitting: Hematology

## 2022-10-07 ENCOUNTER — Inpatient Hospital Stay: Payer: Medicare HMO

## 2022-10-07 VITALS — BP 114/66 | HR 58 | Resp 16

## 2022-10-07 DIAGNOSIS — C9000 Multiple myeloma not having achieved remission: Secondary | ICD-10-CM

## 2022-10-07 DIAGNOSIS — C61 Malignant neoplasm of prostate: Secondary | ICD-10-CM | POA: Diagnosis not present

## 2022-10-07 DIAGNOSIS — Z79899 Other long term (current) drug therapy: Secondary | ICD-10-CM | POA: Diagnosis not present

## 2022-10-07 DIAGNOSIS — Z5111 Encounter for antineoplastic chemotherapy: Secondary | ICD-10-CM | POA: Diagnosis not present

## 2022-10-07 DIAGNOSIS — Z5112 Encounter for antineoplastic immunotherapy: Secondary | ICD-10-CM | POA: Diagnosis not present

## 2022-10-07 DIAGNOSIS — C9002 Multiple myeloma in relapse: Secondary | ICD-10-CM | POA: Diagnosis not present

## 2022-10-07 LAB — COMPREHENSIVE METABOLIC PANEL
ALT: 30 U/L (ref 0–44)
AST: 22 U/L (ref 15–41)
Albumin: 3.5 g/dL (ref 3.5–5.0)
Alkaline Phosphatase: 64 U/L (ref 38–126)
Anion gap: 5 (ref 5–15)
BUN: 18 mg/dL (ref 8–23)
CO2: 26 mmol/L (ref 22–32)
Calcium: 7.9 mg/dL — ABNORMAL LOW (ref 8.9–10.3)
Chloride: 110 mmol/L (ref 98–111)
Creatinine, Ser: 1.05 mg/dL (ref 0.61–1.24)
GFR, Estimated: >60 mL/min (ref >60)
Glucose, Bld: 84 mg/dL (ref 70–99)
Potassium: 3.7 mmol/L (ref 3.5–5.1)
Sodium: 141 mmol/L (ref 135–145)
Total Bilirubin: 0.5 mg/dL (ref 0.3–1.2)
Total Protein: 5.7 g/dL — ABNORMAL LOW (ref 6.5–8.1)

## 2022-10-07 LAB — CBC WITH DIFFERENTIAL/PLATELET
Abs Immature Granulocytes: 0 10*3/uL (ref 0.00–0.07)
Basophils Absolute: 0 10*3/uL (ref 0.0–0.1)
Basophils Relative: 0 %
Eosinophils Absolute: 0.3 10*3/uL (ref 0.0–0.5)
Eosinophils Relative: 11 %
HCT: 30.1 % — ABNORMAL LOW (ref 39.0–52.0)
Hemoglobin: 10.1 g/dL — ABNORMAL LOW (ref 13.0–17.0)
Immature Granulocytes: 0 %
Lymphocytes Relative: 26 %
Lymphs Abs: 0.7 10*3/uL (ref 0.7–4.0)
MCH: 31.4 pg (ref 26.0–34.0)
MCHC: 33.6 g/dL (ref 30.0–36.0)
MCV: 93.5 fL (ref 80.0–100.0)
Monocytes Absolute: 0.3 10*3/uL (ref 0.1–1.0)
Monocytes Relative: 13 %
Neutro Abs: 1.2 10*3/uL — ABNORMAL LOW (ref 1.7–7.7)
Neutrophils Relative %: 50 %
Platelets: 110 10*3/uL — ABNORMAL LOW (ref 150–400)
RBC: 3.22 MIL/uL — ABNORMAL LOW (ref 4.22–5.81)
RDW: 15.9 % — ABNORMAL HIGH (ref 11.5–15.5)
WBC: 2.5 10*3/uL — ABNORMAL LOW (ref 4.0–10.5)
nRBC: 0 % (ref 0.0–0.2)

## 2022-10-07 LAB — MISC LABCORP TEST (SEND OUT): Labcorp test code: 123218

## 2022-10-07 LAB — MAGNESIUM: Magnesium: 2.2 mg/dL (ref 1.7–2.4)

## 2022-10-07 MED ORDER — SODIUM CHLORIDE 0.9% FLUSH
10.0000 mL | INTRAVENOUS | Status: DC | PRN
  Administered 2022-10-07: 10 mL

## 2022-10-07 MED ORDER — DEXTROSE 5 % IV SOLN
56.0000 mg/m2 | Freq: Once | INTRAVENOUS | Status: AC
  Administered 2022-10-07: 110 mg via INTRAVENOUS
  Filled 2022-10-07: qty 30

## 2022-10-07 MED ORDER — HEPARIN SOD (PORK) LOCK FLUSH 100 UNIT/ML IV SOLN
500.0000 [IU] | Freq: Once | INTRAVENOUS | Status: AC | PRN
  Administered 2022-10-07: 500 [IU]

## 2022-10-07 MED ORDER — SODIUM CHLORIDE 0.9 % IV SOLN: Freq: Once | INTRAVENOUS | Status: AC

## 2022-10-07 MED ORDER — SODIUM CHLORIDE 0.9 % IV SOLN
20.0000 mg | Freq: Once | INTRAVENOUS | Status: AC
  Administered 2022-10-07: 20 mg via INTRAVENOUS
  Filled 2022-10-07: qty 20

## 2022-10-07 NOTE — Patient Instructions (Signed)
[facility]  Discharge Instructions: Thank you for choosing [facility] to provide your oncology and hematology care.  If you have a lab appointment with the [facility], please come in thru the Main Entrance and check in at the main information desk.  Wear comfortable clothing and clothing appropriate for easy access to any Portacath or PICC line.   We strive to give you quality time with your provider. You may need to reschedule your appointment if you arrive late (15 or more minutes).  Arriving late affects you and other patients whose appointments are after yours.  Also, if you miss three or more appointments without notifying the office, you may be dismissed from the clinic at the provider's discretion.      For prescription refill requests, have your pharmacy contact our office and allow 72 hours for refills to be completed.    Today you received the following chemotherapy and/or immunotherapy agents Kyprolis      To help prevent nausea and vomiting after your treatment, we encourage you to take your nausea medication as directed.  BELOW ARE SYMPTOMS THAT SHOULD BE REPORTED IMMEDIATELY: *FEVER GREATER THAN 100.4 F (38 C) OR HIGHER *CHILLS OR SWEATING *NAUSEA AND VOMITING THAT IS NOT CONTROLLED WITH YOUR NAUSEA MEDICATION *UNUSUAL SHORTNESS OF BREATH *UNUSUAL BRUISING OR BLEEDING *URINARY PROBLEMS (pain or burning when urinating, or frequent urination) *BOWEL PROBLEMS (unusual diarrhea, constipation, pain near the anus) TENDERNESS IN MOUTH AND THROAT WITH OR WITHOUT PRESENCE OF ULCERS (sore throat, sores in mouth, or a toothache) UNUSUAL RASH, SWELLING OR PAIN  UNUSUAL VAGINAL DISCHARGE OR ITCHING   Items with * indicate a potential emergency and should be followed up as soon as possible or go to the Emergency Department if any problems should occur.  Please show the CHEMOTHERAPY ALERT CARD or IMMUNOTHERAPY ALERT CARD at check-in to the  Emergency Department and triage nurse.  Should you have questions after your visit or need to cancel or reschedule your appointment, please contact [facility] [phone]  and follow the prompts.  Office hours are 8:00 a.m. to 4:30 p.m. Monday - Friday. Please note that voicemails left after 4:00 p.m. may not be returned until the following business day.  We are closed weekends and major holidays. You have access to a nurse at all times for urgent questions. Please call the main number to the clinic [phone] and follow the prompts.  For any non-urgent questions, you may also contact your provider using MyChart. We now offer e-Visits for anyone 61 and older to request care online for non-urgent symptoms. For details visit [email or web address].   Also download the MyChart app! Go to the app store, search "MyChart", open the app, select [facility], and log in with your MyChart username and password.  Masks are optional in the cancer centers. If you would like for your care team to wear a mask while they are taking care of you, please let them know. You may have one support person who is at least 72 years old accompany you for your appointments.

## 2022-10-07 NOTE — Progress Notes (Signed)
Patient presents today for Kyprolis infusion per providers order.  Vital signs within parameters for treatment.  Labs pending.  Patient has no new complaints at this time.    Labs reviewed and [place] noted to be 1.2, [place] notified.  Message received from Charlies Silvers RN/Dr. Delton Coombes okay to proceed with treatment.  Treatment given today per MD orders.  Stable during infusion without adverse affects.  Vital signs stable.  No complaints at this time.  Discharge from clinic ambulatory in stable condition.  Alert and oriented X 3.  Follow up with [facility] as scheduled.

## 2022-10-08 ENCOUNTER — Ambulatory Visit (INDEPENDENT_AMBULATORY_CARE_PROVIDER_SITE_OTHER): Payer: Medicare HMO

## 2022-10-08 DIAGNOSIS — Z23 Encounter for immunization: Secondary | ICD-10-CM

## 2022-10-09 DIAGNOSIS — C9000 Multiple myeloma not having achieved remission: Secondary | ICD-10-CM | POA: Diagnosis not present

## 2022-10-09 DIAGNOSIS — Z79818 Long term (current) use of other agents affecting estrogen receptors and estrogen levels: Secondary | ICD-10-CM | POA: Diagnosis not present

## 2022-10-09 DIAGNOSIS — C61 Malignant neoplasm of prostate: Secondary | ICD-10-CM | POA: Diagnosis not present

## 2022-10-14 ENCOUNTER — Inpatient Hospital Stay: Payer: Medicare HMO

## 2022-10-14 VITALS — BP 104/55 | HR 55 | Temp 97.2°F | Resp 18

## 2022-10-14 DIAGNOSIS — Z5111 Encounter for antineoplastic chemotherapy: Secondary | ICD-10-CM | POA: Diagnosis not present

## 2022-10-14 DIAGNOSIS — C9000 Multiple myeloma not having achieved remission: Secondary | ICD-10-CM

## 2022-10-14 DIAGNOSIS — Z5112 Encounter for antineoplastic immunotherapy: Secondary | ICD-10-CM | POA: Diagnosis not present

## 2022-10-14 DIAGNOSIS — C61 Malignant neoplasm of prostate: Secondary | ICD-10-CM | POA: Diagnosis not present

## 2022-10-14 DIAGNOSIS — Z79899 Other long term (current) drug therapy: Secondary | ICD-10-CM | POA: Diagnosis not present

## 2022-10-14 DIAGNOSIS — C9002 Multiple myeloma in relapse: Secondary | ICD-10-CM | POA: Diagnosis not present

## 2022-10-14 LAB — CBC WITH DIFFERENTIAL/PLATELET
Abs Immature Granulocytes: 0.01 10*3/uL (ref 0.00–0.07)
Basophils Absolute: 0.1 10*3/uL (ref 0.0–0.1)
Basophils Relative: 2 %
Eosinophils Absolute: 0.1 10*3/uL (ref 0.0–0.5)
Eosinophils Relative: 4 %
HCT: 31.6 % — ABNORMAL LOW (ref 39.0–52.0)
Hemoglobin: 10.6 g/dL — ABNORMAL LOW (ref 13.0–17.0)
Immature Granulocytes: 0 %
Lymphocytes Relative: 29 %
Lymphs Abs: 0.9 10*3/uL (ref 0.7–4.0)
MCH: 32.1 pg (ref 26.0–34.0)
MCHC: 33.5 g/dL (ref 30.0–36.0)
MCV: 95.8 fL (ref 80.0–100.0)
Monocytes Absolute: 0.3 10*3/uL (ref 0.1–1.0)
Monocytes Relative: 10 %
Neutro Abs: 1.7 10*3/uL (ref 1.7–7.7)
Neutrophils Relative %: 55 %
Platelets: 155 10*3/uL (ref 150–400)
RBC: 3.3 MIL/uL — ABNORMAL LOW (ref 4.22–5.81)
RDW: 16.8 % — ABNORMAL HIGH (ref 11.5–15.5)
WBC: 3.1 10*3/uL — ABNORMAL LOW (ref 4.0–10.5)
nRBC: 0 % (ref 0.0–0.2)

## 2022-10-14 LAB — COMPREHENSIVE METABOLIC PANEL
ALT: 23 U/L (ref 0–44)
AST: 19 U/L (ref 15–41)
Albumin: 3.5 g/dL (ref 3.5–5.0)
Alkaline Phosphatase: 59 U/L (ref 38–126)
Anion gap: 4 — ABNORMAL LOW (ref 5–15)
BUN: 23 mg/dL (ref 8–23)
CO2: 26 mmol/L (ref 22–32)
Calcium: 8.1 mg/dL — ABNORMAL LOW (ref 8.9–10.3)
Chloride: 112 mmol/L — ABNORMAL HIGH (ref 98–111)
Creatinine, Ser: 1.06 mg/dL (ref 0.61–1.24)
GFR, Estimated: >60 mL/min (ref >60)
Glucose, Bld: 92 mg/dL (ref 70–99)
Potassium: 3.9 mmol/L (ref 3.5–5.1)
Sodium: 142 mmol/L (ref 135–145)
Total Bilirubin: 0.5 mg/dL (ref 0.3–1.2)
Total Protein: 5.5 g/dL — ABNORMAL LOW (ref 6.5–8.1)

## 2022-10-14 LAB — MAGNESIUM: Magnesium: 2.1 mg/dL (ref 1.7–2.4)

## 2022-10-14 MED ORDER — ACETAMINOPHEN 325 MG PO TABS
650.0000 mg | ORAL_TABLET | Freq: Once | ORAL | Status: AC
  Administered 2022-10-14: 650 mg via ORAL
  Filled 2022-10-14: qty 2

## 2022-10-14 MED ORDER — SODIUM CHLORIDE 0.9 % IV SOLN: Freq: Once | INTRAVENOUS | Status: AC

## 2022-10-14 MED ORDER — SODIUM CHLORIDE 0.9 % IV SOLN
20.0000 mg | Freq: Once | INTRAVENOUS | Status: AC
  Administered 2022-10-14: 20 mg via INTRAVENOUS
  Filled 2022-10-14: qty 20

## 2022-10-14 MED ORDER — DEXTROSE 5 % IV SOLN
56.0000 mg/m2 | Freq: Once | INTRAVENOUS | Status: AC
  Administered 2022-10-14: 110 mg via INTRAVENOUS
  Filled 2022-10-14: qty 10

## 2022-10-14 MED ORDER — HEPARIN SOD (PORK) LOCK FLUSH 100 UNIT/ML IV SOLN
500.0000 [IU] | Freq: Once | INTRAVENOUS | Status: AC | PRN
  Administered 2022-10-14: 500 [IU]

## 2022-10-14 MED ORDER — SODIUM CHLORIDE 0.9% FLUSH
10.0000 mL | INTRAVENOUS | Status: DC | PRN
  Administered 2022-10-14: 10 mL

## 2022-10-14 MED ORDER — DIPHENHYDRAMINE HCL 25 MG PO CAPS
50.0000 mg | ORAL_CAPSULE | Freq: Once | ORAL | Status: AC
  Administered 2022-10-14: 50 mg via ORAL
  Filled 2022-10-14: qty 2

## 2022-10-14 MED ORDER — DARATUMUMAB-HYALURONIDASE-FIHJ 1800-30000 MG-UT/15ML ~~LOC~~ SOLN
1800.0000 mg | Freq: Once | SUBCUTANEOUS | Status: AC
  Administered 2022-10-14: 1800 mg via SUBCUTANEOUS
  Filled 2022-10-14: qty 15

## 2022-10-14 NOTE — Patient Instructions (Signed)
[facility]  Discharge Instructions: Thank you for choosing [facility] to provide your oncology and hematology care.  If you have a lab appointment with the [facility], please come in thru the Main Entrance and check in at the main information desk.  Wear comfortable clothing and clothing appropriate for easy access to any Portacath or PICC line.   We strive to give you quality time with your provider. You may need to reschedule your appointment if you arrive late (15 or more minutes).  Arriving late affects you and other patients whose appointments are after yours.  Also, if you miss three or more appointments without notifying the office, you may be dismissed from the clinic at the provider's discretion.      For prescription refill requests, have your pharmacy contact our office and allow 72 hours for refills to be completed.    Today you received the following chemotherapy and/or immunotherapy agents Daratumumab [place] and Kyprolis   To help prevent nausea and vomiting after your treatment, we encourage you to take your nausea medication as directed.  Daratumumab; Hyaluronidase Injection What is this medication? DARATUMUMAB; HYALURONIDASE (dar a toom ue mab; hye al ur ON i dase) treats multiple myeloma, a type of bone marrow cancer. Daratumumab works by blocking a protein that causes cancer cells to grow and multiply. This helps to slow or stop the spread of cancer cells. Hyaluronidase works by increasing the absorption of other medications in the body to help them work better. This medication may also be used treat amyloidosis, a condition that causes the buildup of a protein (amyloid) in your body. It works by reducing the buildup of this protein, which decreases symptoms. It is a combination medication that contains a monoclonal antibody. This medicine may be used for other purposes; ask your health care provider or pharmacist if you have questions. COMMON  BRAND NAME(S): DARZALEX FASPRO What should I tell my care team before I take this medication? They need to know if you have any of these conditions: Heart disease Infection, such as chickenpox, cold sores, herpes, hepatitis B Lung or breathing disease An unusual or allergic reaction to daratumumab, hyaluronidase, other medications, foods, dyes, or preservatives Pregnant or trying to get pregnant Breast-feeding How should I use this medication? This medication is injected under the skin. It is given by your care team in a hospital or clinic setting. Talk to your care team about the use of this medication in children. Special care may be needed. Overdosage: If you think you have taken too much of this medicine contact a poison control center or emergency room at once. NOTE: This medicine is only for you. Do not share this medicine with others. What if I miss a dose? Keep appointments for follow-up doses. It is important not to miss your dose. Call your care team if you are unable to keep an appointment. What may interact with this medication? Interactions have not been studied. This list may not describe all possible interactions. Give your health care provider a list of all the medicines, herbs, non-prescription drugs, or dietary supplements you use. Also tell them if you smoke, drink alcohol, or use illegal drugs. Some items may interact with your medicine. What should I watch for while using this medication? Your condition will be monitored carefully while you are receiving this medication. This medication can cause serious allergic reactions. To reduce your risk, your care team may give you other medication to take before  receiving this one. Be sure to follow the directions from your care team. This medication can affect the results of blood tests to match your blood type. These changes can last for up to 6 months after the final dose. Your care team will do blood tests to match your blood  type before you start treatment. Tell all of your care team that you are being treated with this medication before receiving a blood transfusion. This medication can affect the results of some tests used to determine treatment response; extra tests may be needed to evaluate response. Talk to your care team if you wish to become pregnant or think you are pregnant. This medication can cause serious birth defects if taken during pregnancy and for 3 months after the last dose. A reliable form of contraception is recommended while taking this medication and for 3 months after the last dose. Talk to your care team about effective forms of contraception. Do not breast-feed while taking this medication. What side effects may I notice from receiving this medication? Side effects that you should report to your care team as soon as possible: Allergic reactions--skin rash, itching, hives, swelling of the face, lips, tongue, or throat Heart rhythm changes--fast or irregular heartbeat, dizziness, feeling faint or lightheaded, chest pain, trouble breathing Infection--fever, chills, cough, sore throat, wounds that don't heal, pain or trouble when passing urine, general feeling of discomfort or being unwell Infusion reactions--chest pain, shortness of breath or trouble breathing, feeling faint or lightheaded Sudden eye pain or change in vision such as blurry vision, seeing halos around lights, vision loss Unusual bruising or bleeding Side effects that usually do not require medical attention (report to your care team if they continue or are bothersome): Constipation Diarrhea Fatigue Nausea Pain, tingling, or numbness in the hands or feet Swelling of the ankles, hands, or feet This list may not describe all possible side effects. Call your doctor for medical advice about side effects. You may report side effects to FDA at 1-800-FDA-1088. Where should I keep my medication? This medication is given in a hospital or  clinic. It will not be stored at home. NOTE: This sheet is a summary. It may not cover all possible information. If you have questions about this medicine, talk to your doctor, pharmacist, or health care provider.  2023 Elsevier/Gold Standard (2022-04-02 00:00:00)  Carfilzomib Injection What is this medication? CARFILZOMIB (kar FILZ oh mib) treats multiple myeloma, a type of bone marrow cancer. It works by blocking a protein that causes cancer cells to grow and multiply. This helps to slow or stop the spread of cancer cells. This medicine may be used for other purposes; ask your health care provider or pharmacist if you have questions. COMMON BRAND NAME(S): KYPROLIS What should I tell my care team before I take this medication? They need to know if you have any of these conditions: Heart disease History of blood clots Irregular heartbeat Kidney disease Liver disease Lung or breathing disease An unusual or allergic reaction to carfilzomib, or other medications, foods, dyes, or preservatives If you or your partner are pregnant or trying to get pregnant Breastfeeding How should I use this medication? This medication is injected into a vein. It is given by your care team in a hospital or clinic setting. Talk to your care team about the use of this medication in children. Special care may be needed. Overdosage: If you think you have taken too much of this medicine contact a poison control center or emergency  room at once. NOTE: This medicine is only for you. Do not share this medicine with others. What if I miss a dose? Keep appointments for follow-up doses. It is important not to miss your dose. Call your care team if you are unable to keep an appointment. What may interact with this medication? Interactions are not expected. This list may not describe all possible interactions. Give your health care provider a list of all the medicines, herbs, non-prescription drugs, or dietary supplements  you use. Also tell them if you smoke, drink alcohol, or use illegal drugs. Some items may interact with your medicine. What should I watch for while using this medication? Your condition will be monitored carefully while you are receiving this medication. You may need blood work while taking this medication. Check with your care team if you have severe diarrhea, nausea, and vomiting, or if you sweat a lot. The loss of too much body fluid may make it dangerous for you to take this medication. This medication may affect your coordination, reaction time, or judgment. Do not drive or operate machinery until you know how this medication affects you. Sit up or stand slowly to reduce the risk of dizzy or fainting spells. Drinking alcohol with this medication can increase the risk of these side effects. Talk to your care team if you may be pregnant. Serious birth defects can occur if you take this medication during pregnancy and for 6 months after the last dose. You will need a negative pregnancy test before starting this medication. Contraception is recommended while taking this medication and for 6 months after the last dose. Your care team can help you find an option that works for you. If your partner can get pregnant, use a condom during sex while taking this medication and for 3 months after the last dose. Do not breastfeed while taking this medication and for 2 weeks after the last dose. This medication may cause infertility. Talk to your care team if you are concerned about your fertility. What side effects may I notice from receiving this medication? Side effects that you should report to your care team as soon as possible: Allergic reactions--skin rash, itching, hives, swelling of the face, lips, tongue, or throat Bleeding--bloody or black, tar-like stools, vomiting blood or brown material that looks like coffee grounds, red or dark brown urine, small red or purple spots on skin, unusual bruising or  bleeding Blood clot--pain, swelling, or warmth in the leg, shortness of breath, chest pain Dizziness, loss of balance or coordination, confusion or trouble speaking Heart attack--pain or tightness in the chest, shoulders, arms, or jaw, nausea, shortness of breath, cold or clammy skin, feeling faint or lightheaded Heart failure--shortness of breath, swelling of the ankles, feet, or hands, sudden weight gain, unusual weakness or fatigue Heart rhythm changes--fast or irregular heartbeat, dizziness, feeling faint or lightheaded, chest pain, trouble breathing Increase in blood pressure Infection--fever, chills, cough, sore throat, wounds that don't heal, pain or trouble when passing urine, general feeling of discomfort or being unwell Infusion reactions--chest pain, shortness of breath or trouble breathing, feeling faint or lightheaded Kidney injury--decrease in the amount of urine, swelling of the ankles, hands, or feet Liver injury--right upper belly pain, loss of appetite, nausea, light-colored stool, dark yellow or brown urine, yellowing skin or eyes, unusual weakness or fatigue Lung injury--shortness of breath or trouble breathing, cough, spitting up blood, chest pain, fever Pulmonary hypertension--shortness of breath, chest pain, fast or irregular heartbeat, feeling faint or lightheaded, fatigue,  swelling of the ankles or feet Stomach pain, bloody diarrhea, pale skin, unusual weakness or fatigue, decrease in the amount of urine, which may be signs of hemolytic uremic syndrome Sudden and severe headache, confusion, change in vision, seizures, which may be signs of posterior reversible encephalopathy syndrome (PRES) TTP--purple spots on the skin or inside the mouth, pale skin, yellowing skin or eyes, unusual weakness or fatigue, fever, fast or irregular heartbeat, confusion, change in vision, trouble speaking, trouble walking Tumor lysis syndrome (TLS)--nausea, vomiting, diarrhea, decrease in the  amount of urine, dark urine, unusual weakness or fatigue, confusion, muscle pain or cramps, fast or irregular heartbeat, joint pain Side effects that usually do not require medical attention (report to your care team if they continue or are bothersome): Diarrhea Fatigue Nausea Trouble sleeping This list may not describe all possible side effects. Call your doctor for medical advice about side effects. You may report side effects to FDA at 1-800-FDA-1088. Where should I keep my medication? This medication is given in a hospital or clinic. It will not be stored at home. NOTE: This sheet is a summary. It may not cover all possible information. If you have questions about this medicine, talk to your doctor, pharmacist, or health care provider.  2023 Elsevier/Gold Standard (2022-05-07 00:00:00)    BELOW ARE SYMPTOMS THAT SHOULD BE REPORTED IMMEDIATELY: *FEVER GREATER THAN 100.4 F (38 C) OR HIGHER *CHILLS OR SWEATING *NAUSEA AND VOMITING THAT IS NOT CONTROLLED WITH YOUR NAUSEA MEDICATION *UNUSUAL SHORTNESS OF BREATH *UNUSUAL BRUISING OR BLEEDING *URINARY PROBLEMS (pain or burning when urinating, or frequent urination) *BOWEL PROBLEMS (unusual diarrhea, constipation, pain near the anus) TENDERNESS IN MOUTH AND THROAT WITH OR WITHOUT PRESENCE OF ULCERS (sore throat, sores in mouth, or a toothache) UNUSUAL RASH, SWELLING OR PAIN  UNUSUAL VAGINAL DISCHARGE OR ITCHING   Items with * indicate a potential emergency and should be followed up as soon as possible or go to the Emergency Department if any problems should occur.  Please show the CHEMOTHERAPY ALERT CARD or IMMUNOTHERAPY ALERT CARD at check-in to the Emergency Department and triage nurse.  Should you have questions after your visit or need to cancel or reschedule your appointment, please contact [facility] [phone]  and follow the prompts.  Office hours are 8:00 a.m. to 4:30 p.m. Monday - Friday. Please note  that voicemails left after 4:00 p.m. may not be returned until the following business day.  We are closed weekends and major holidays. You have access to a nurse at all times for urgent questions. Please call the main number to the clinic [phone] and follow the prompts.  For any non-urgent questions, you may also contact your provider using MyChart. We now offer e-Visits for anyone 9 and older to request care online for non-urgent symptoms. For details visit [email or web address].   Also download the MyChart app! Go to the app store, search "MyChart", open the app, select [facility], and log in with your MyChart username and password.  Masks are optional in the cancer centers. If you would like for your care team to wear a mask while they are taking care of you, please let them know. You may have one support person who is at least 72 years old accompany you for your appointments.

## 2022-10-14 NOTE — Progress Notes (Signed)
Pt presents today for Daratumumab [place] and Kyprolis per provider's order. Vital signs and labs WNL for treatment. Okay to proceed with treatment today per Dr.K.  Daratumumab [place] and Kyprolis given today per MD orders. Tolerated infusion without adverse affects. Vital signs stable. No complaints at this time. Discharged from clinic ambulatory in stable condition. Alert and oriented x 3. F/U with [facility] as scheduled.

## 2022-10-21 ENCOUNTER — Inpatient Hospital Stay: Payer: Medicare HMO

## 2022-10-21 VITALS — BP 141/70 | HR 61 | Temp 98.2°F | Resp 18

## 2022-10-21 DIAGNOSIS — C9000 Multiple myeloma not having achieved remission: Secondary | ICD-10-CM

## 2022-10-21 DIAGNOSIS — C61 Malignant neoplasm of prostate: Secondary | ICD-10-CM

## 2022-10-21 DIAGNOSIS — C9002 Multiple myeloma in relapse: Secondary | ICD-10-CM | POA: Diagnosis not present

## 2022-10-21 DIAGNOSIS — Z79899 Other long term (current) drug therapy: Secondary | ICD-10-CM | POA: Diagnosis not present

## 2022-10-21 DIAGNOSIS — Z5111 Encounter for antineoplastic chemotherapy: Secondary | ICD-10-CM | POA: Diagnosis not present

## 2022-10-21 DIAGNOSIS — Z95828 Presence of other vascular implants and grafts: Secondary | ICD-10-CM

## 2022-10-21 DIAGNOSIS — Z5112 Encounter for antineoplastic immunotherapy: Secondary | ICD-10-CM | POA: Diagnosis not present

## 2022-10-21 LAB — CBC WITH DIFFERENTIAL/PLATELET
Abs Immature Granulocytes: 0.01 10*3/uL (ref 0.00–0.07)
Basophils Absolute: 0 10*3/uL (ref 0.0–0.1)
Basophils Relative: 1 %
Eosinophils Absolute: 0.2 10*3/uL (ref 0.0–0.5)
Eosinophils Relative: 6 %
HCT: 31 % — ABNORMAL LOW (ref 39.0–52.0)
Hemoglobin: 10.5 g/dL — ABNORMAL LOW (ref 13.0–17.0)
Immature Granulocytes: 0 %
Lymphocytes Relative: 17 %
Lymphs Abs: 0.5 10*3/uL — ABNORMAL LOW (ref 0.7–4.0)
MCH: 32.3 pg (ref 26.0–34.0)
MCHC: 33.9 g/dL (ref 30.0–36.0)
MCV: 95.4 fL (ref 80.0–100.0)
Monocytes Absolute: 0.2 10*3/uL (ref 0.1–1.0)
Monocytes Relative: 7 %
Neutro Abs: 1.9 10*3/uL (ref 1.7–7.7)
Neutrophils Relative %: 69 %
Platelets: 143 10*3/uL — ABNORMAL LOW (ref 150–400)
RBC: 3.25 MIL/uL — ABNORMAL LOW (ref 4.22–5.81)
RDW: 16.2 % — ABNORMAL HIGH (ref 11.5–15.5)
WBC: 2.7 10*3/uL — ABNORMAL LOW (ref 4.0–10.5)
nRBC: 0 % (ref 0.0–0.2)

## 2022-10-21 LAB — COMPREHENSIVE METABOLIC PANEL
ALT: 21 U/L (ref 0–44)
AST: 16 U/L (ref 15–41)
Albumin: 3.5 g/dL (ref 3.5–5.0)
Alkaline Phosphatase: 65 U/L (ref 38–126)
Anion gap: 6 (ref 5–15)
BUN: 17 mg/dL (ref 8–23)
CO2: 23 mmol/L (ref 22–32)
Calcium: 7.9 mg/dL — ABNORMAL LOW (ref 8.9–10.3)
Chloride: 108 mmol/L (ref 98–111)
Creatinine, Ser: 1.16 mg/dL (ref 0.61–1.24)
GFR, Estimated: >60 mL/min (ref >60)
Glucose, Bld: 112 mg/dL — ABNORMAL HIGH (ref 70–99)
Potassium: 3.6 mmol/L (ref 3.5–5.1)
Sodium: 137 mmol/L (ref 135–145)
Total Bilirubin: 0.8 mg/dL (ref 0.3–1.2)
Total Protein: 5.7 g/dL — ABNORMAL LOW (ref 6.5–8.1)

## 2022-10-21 LAB — MAGNESIUM: Magnesium: 2 mg/dL (ref 1.7–2.4)

## 2022-10-21 LAB — LACTATE DEHYDROGENASE: LDH: 142 U/L (ref 98–192)

## 2022-10-21 LAB — PSA: Prostatic Specific Antigen: 2.12 ng/mL (ref 0.00–4.00)

## 2022-10-21 MED ORDER — SODIUM CHLORIDE 0.9% FLUSH
10.0000 mL | INTRAVENOUS | Status: DC | PRN
  Administered 2022-10-21: 10 mL via INTRAVENOUS

## 2022-10-21 MED ORDER — HEPARIN SOD (PORK) LOCK FLUSH 100 UNIT/ML IV SOLN
500.0000 [IU] | Freq: Once | INTRAVENOUS | Status: AC
  Administered 2022-10-21: 500 [IU] via INTRAVENOUS

## 2022-10-21 NOTE — Patient Instructions (Signed)
[facility]  Discharge Instructions: Thank you for choosing [facility] to provide your oncology and hematology care.  If you have a lab appointment with the Cancer Center, please come in thru the Main Entrance and check in at the main information desk.  Wear comfortable clothing and clothing appropriate for easy access to any Portacath or PICC line.   We strive to give you quality time with your provider. You may need to reschedule your appointment if you arrive late (15 or more minutes).  Arriving late affects you and other patients whose appointments are after yours.  Also, if you miss three or more appointments without notifying the office, you may be dismissed from the clinic at the provider's discretion.      For prescription refill requests, have your pharmacy contact our office and allow 72 hours for refills to be completed.     To help prevent nausea and vomiting after your treatment, we encourage you to take your nausea medication as directed.  BELOW ARE SYMPTOMS THAT SHOULD BE REPORTED IMMEDIATELY: *FEVER GREATER THAN 100.4 F (38 C) OR HIGHER *CHILLS OR SWEATING *NAUSEA AND VOMITING THAT IS NOT CONTROLLED WITH YOUR NAUSEA MEDICATION *UNUSUAL SHORTNESS OF BREATH *UNUSUAL BRUISING OR BLEEDING *URINARY PROBLEMS (pain or burning when urinating, or frequent urination) *BOWEL PROBLEMS (unusual diarrhea, constipation, pain near the anus) TENDERNESS IN MOUTH AND THROAT WITH OR WITHOUT PRESENCE OF ULCERS (sore throat, sores in mouth, or a toothache) UNUSUAL RASH, SWELLING OR PAIN  UNUSUAL VAGINAL DISCHARGE OR ITCHING   Items with * indicate a potential emergency and should be followed up as soon as possible or go to the Emergency Department if any problems should occur.  Please show the CHEMOTHERAPY ALERT CARD or IMMUNOTHERAPY ALERT CARD at check-in to the Emergency Department and triage nurse.  Should you have questions after your visit or need to  cancel or reschedule your appointment, please contact [facility] [phone]  and follow the prompts.  Office hours are 8:00 a.m. to 4:30 p.m. Monday - Friday. Please note that voicemails left after 4:00 p.m. may not be returned until the following business day.  We are closed weekends and major holidays. You have access to a nurse at all times for urgent questions. Please call the main number to the clinic [phone] and follow the prompts.  For any non-urgent questions, you may also contact your provider using MyChart. We now offer e-Visits for anyone 18 and older to request care online for non-urgent symptoms. For details visit [email or web address].   Also download the MyChart app! Go to the app store, search "MyChart", open the app, select [facility], and log in with your MyChart username and password.  Masks are optional in the cancer centers. If you would like for your care team to wear a mask while they are taking care of you, please let them know. You may have one support person who is at least 72 years old accompany you for your appointments.  

## 2022-10-21 NOTE — Progress Notes (Signed)
Patients port flushed without difficulty.  Good blood return noted with no bruising or swelling noted at site.  Band aid applied.  VSS with discharge and left in satisfactory condition with no s/s of distress noted.   

## 2022-10-22 LAB — KAPPA/LAMBDA LIGHT CHAINS
Kappa free light chain: 2.4 mg/L — ABNORMAL LOW (ref 3.3–19.4)
Kappa, lambda light chain ratio: 1.26 (ref 0.26–1.65)
Lambda free light chains: 1.9 mg/L — ABNORMAL LOW (ref 5.7–26.3)

## 2022-10-23 LAB — PROTEIN ELECTROPHORESIS, SERUM
A/G Ratio: 1.8 — ABNORMAL HIGH (ref 0.7–1.7)
Albumin ELP: 3.2 g/dL (ref 2.9–4.4)
Alpha-1-Globulin: 0.2 g/dL (ref 0.0–0.4)
Alpha-2-Globulin: 0.5 g/dL (ref 0.4–1.0)
Beta Globulin: 0.8 g/dL (ref 0.7–1.3)
Gamma Globulin: 0.3 g/dL — ABNORMAL LOW (ref 0.4–1.8)
Globulin, Total: 1.8 g/dL — ABNORMAL LOW (ref 2.2–3.9)
M-Spike, %: 0.1 g/dL — ABNORMAL HIGH
Total Protein ELP: 5 g/dL — ABNORMAL LOW (ref 6.0–8.5)

## 2022-10-28 ENCOUNTER — Encounter: Payer: Self-pay | Admitting: Urology

## 2022-10-28 ENCOUNTER — Ambulatory Visit (INDEPENDENT_AMBULATORY_CARE_PROVIDER_SITE_OTHER): Payer: Medicare HMO | Admitting: Urology

## 2022-10-28 ENCOUNTER — Inpatient Hospital Stay: Payer: Medicare HMO | Attending: Hematology | Admitting: Hematology

## 2022-10-28 ENCOUNTER — Inpatient Hospital Stay: Payer: Medicare HMO

## 2022-10-28 ENCOUNTER — Other Ambulatory Visit: Payer: Self-pay

## 2022-10-28 VITALS — BP 122/62 | HR 55 | Temp 98.2°F | Resp 18 | Wt 189.2 lb

## 2022-10-28 VITALS — BP 147/74 | HR 71 | Ht 74.0 in | Wt 189.2 lb

## 2022-10-28 DIAGNOSIS — C61 Malignant neoplasm of prostate: Secondary | ICD-10-CM

## 2022-10-28 DIAGNOSIS — C9002 Multiple myeloma in relapse: Secondary | ICD-10-CM | POA: Insufficient documentation

## 2022-10-28 DIAGNOSIS — C9000 Multiple myeloma not having achieved remission: Secondary | ICD-10-CM

## 2022-10-28 DIAGNOSIS — Z5112 Encounter for antineoplastic immunotherapy: Secondary | ICD-10-CM | POA: Insufficient documentation

## 2022-10-28 DIAGNOSIS — R5383 Other fatigue: Secondary | ICD-10-CM

## 2022-10-28 DIAGNOSIS — C7951 Secondary malignant neoplasm of bone: Secondary | ICD-10-CM | POA: Diagnosis not present

## 2022-10-28 DIAGNOSIS — Z5111 Encounter for antineoplastic chemotherapy: Secondary | ICD-10-CM | POA: Diagnosis present

## 2022-10-28 DIAGNOSIS — N400 Enlarged prostate without lower urinary tract symptoms: Secondary | ICD-10-CM

## 2022-10-28 DIAGNOSIS — Z79899 Other long term (current) drug therapy: Secondary | ICD-10-CM | POA: Insufficient documentation

## 2022-10-28 DIAGNOSIS — Z8546 Personal history of malignant neoplasm of prostate: Secondary | ICD-10-CM | POA: Diagnosis not present

## 2022-10-28 MED ORDER — SODIUM CHLORIDE 0.9 % IV SOLN: Freq: Once | INTRAVENOUS | Status: AC

## 2022-10-28 MED ORDER — DEXTROSE 5 % IV SOLN
56.0000 mg/m2 | Freq: Once | INTRAVENOUS | Status: AC
  Administered 2022-10-28: 110 mg via INTRAVENOUS
  Filled 2022-10-28: qty 30

## 2022-10-28 MED ORDER — DIPHENHYDRAMINE HCL 25 MG PO CAPS
25.0000 mg | ORAL_CAPSULE | Freq: Once | ORAL | Status: AC
  Administered 2022-10-28: 25 mg via ORAL
  Filled 2022-10-28: qty 1

## 2022-10-28 MED ORDER — ACETAMINOPHEN 325 MG PO TABS
650.0000 mg | ORAL_TABLET | Freq: Once | ORAL | Status: AC
  Administered 2022-10-28: 650 mg via ORAL
  Filled 2022-10-28: qty 2

## 2022-10-28 MED ORDER — LENALIDOMIDE 15 MG PO CAPS: 15.0000 mg | ORAL_CAPSULE | Freq: Every day | ORAL | 0 refills | Status: DC

## 2022-10-28 MED ORDER — HEPARIN SOD (PORK) LOCK FLUSH 100 UNIT/ML IV SOLN
500.0000 [IU] | Freq: Once | INTRAVENOUS | Status: AC | PRN
  Administered 2022-10-28: 500 [IU]

## 2022-10-28 MED ORDER — SODIUM CHLORIDE 0.9 % IV SOLN
20.0000 mg | Freq: Once | INTRAVENOUS | Status: AC
  Administered 2022-10-28: 20 mg via INTRAVENOUS
  Filled 2022-10-28: qty 2

## 2022-10-28 MED ORDER — DENOSUMAB 120 MG/1.7ML ~~LOC~~ SOLN
120.0000 mg | Freq: Once | SUBCUTANEOUS | Status: AC
  Administered 2022-10-28: 120 mg via SUBCUTANEOUS
  Filled 2022-10-28: qty 1.7

## 2022-10-28 MED ORDER — DARATUMUMAB-HYALURONIDASE-FIHJ 1800-30000 MG-UT/15ML ~~LOC~~ SOLN
1800.0000 mg | Freq: Once | SUBCUTANEOUS | Status: AC
  Administered 2022-10-28: 1800 mg via SUBCUTANEOUS
  Filled 2022-10-28: qty 15

## 2022-10-28 MED ORDER — DEGARELIX ACETATE 80 MG ~~LOC~~ SOLR
80.0000 mg | Freq: Once | SUBCUTANEOUS | Status: AC
  Administered 2022-10-28: 80 mg via SUBCUTANEOUS
  Filled 2022-10-28: qty 4

## 2022-10-28 MED ORDER — SODIUM CHLORIDE 0.9% FLUSH
10.0000 mL | INTRAVENOUS | Status: DC | PRN
  Administered 2022-10-28: 10 mL

## 2022-10-28 NOTE — Progress Notes (Signed)
[facility] [place], [place] [address]   CLINIC:  Medical Oncology/Hematology  PCP:  Lindell Spar, MD [address] / [place] [address] [phone]   REASON FOR VISIT:  Follow-up for prostate cancer and multiple myeloma  PRIOR THERAPY: none  NGS Results: not done  CURRENT THERAPY: Carfilzomib (20/70) + Daratumumab SQ + Dexamethasone (20/40) DaraKRd q28d  BRIEF ONCOLOGIC HISTORY:  Oncology History  Multiple myeloma ([facility])  04/09/2022 Initial Diagnosis   Multiple myeloma ([facility])   04/15/2022 -  Chemotherapy   Patient is on Treatment Plan : MYELOMA RELAPSED/REFRACTORY Carfilzomib (20/70) + Daratumumab SQ + Dexamethasone (20/40) DaraKd q28d       CANCER STAGING:  Cancer Staging  Multiple myeloma ([facility]) Staging form: Plasma Cell Myeloma and Plasma Cell Disorders, AJCC 8th Edition - Clinical: No stage assigned - Unsigned   INTERVAL HISTORY:  [name], a 71 y.o. male, seen for multiple myeloma and prostate cancer.  He is tolerating treatments reasonably well.  He has occasional diarrhea which is stable.  Energy levels are 80%.  He has occasional hot flashes which are manageable.  He has taste changes most likely from Revlimid.  Occasionally gets cramps in the hands and legs.  Numbness and tingling is stable in the hands and feet.  No fevers or infections.  REVIEW OF SYSTEMS:  Review of Systems  Gastrointestinal:  Positive for diarrhea.  Endocrine: Positive for hot flashes.  Neurological:  Positive for numbness (hands and feet tingling).  All other systems reviewed and are negative.   PAST MEDICAL/SURGICAL HISTORY:  Past Medical History:  Diagnosis Date   Arthritis    Asbestosis ([facility])    Closed fracture of distal end of fibula with tibia with routine healing 05/28/2020   COVID-19 09/03/2021   Cyst of kidney, acquired    Headache    History of fracture of leg    Right, childhood   Hx of migraine headaches     Hyperlipidemia    Neuropathy    PE (pulmonary thromboembolism) ([facility])    After knee surgery   Prostate cancer ([facility]) 2015   Adenocarcinoma by biopsy   Past Surgical History:  Procedure Laterality Date   BIOPSY PROSTATE  2003 and 2005   COLONOSCOPY  11/28/2008   Dr. Rourk:internal hemorrhoids/tortus colon/ascending colon polyps, tubular adenomas   COLONOSCOPY N/A 09/04/2014   Procedure: COLONOSCOPY;  Surgeon: Daneil Dolin, MD;  [facility];  Service: Endoscopy;  Laterality: N/A;  9:30   COLONOSCOPY N/A 07/17/2017   Procedure: COLONOSCOPY;  Surgeon: Danie Binder, MD;  [facility];  Service: Endoscopy;  Laterality: N/A;   ESOPHAGOGASTRODUODENOSCOPY N/A 07/16/2017   Procedure: ESOPHAGOGASTRODUODENOSCOPY (EGD);  Surgeon: Danie Binder, MD;  [facility];  Service: Endoscopy;  Laterality: N/A;   GIVENS CAPSULE STUDY  07/16/2017   Procedure: GIVENS CAPSULE STUDY;  Surgeon: Danie Binder, MD;  [facility];  Service: Endoscopy;;   JOINT REPLACEMENT N/A    Phreesia 01/12/2021   POLYPECTOMY  07/17/2017   Procedure: POLYPECTOMY;  Surgeon: Danie Binder, MD;  [facility];  Service: Endoscopy;;  cecal   PORTACATH PLACEMENT Left 04/18/2022   Procedure: INSERTION PORT-A-CATH;  Surgeon: Aviva Signs, MD;  Location: [facility];  Service: General;  Laterality: Left;   TOTAL KNEE ARTHROPLASTY Right 06/10/2017   TOTAL KNEE ARTHROPLASTY Right 06/10/2017   Procedure: TOTAL KNEE ARTHROPLASTY;  Surgeon: [name], MD;  Location: [facility];  Service: Orthopedics;  Laterality: Right;    SOCIAL HISTORY:  Social History   Socioeconomic History   Marital status: Married    Spouse name: Not on file   Number of children: Not on file   Years of education: Not on file   Highest education level: Not on file  Occupational History   Occupation: Retired  Tobacco Use   Smoking status: Never   Smokeless tobacco: Never  Vaping Use   Vaping Use: Never  used  Substance and Sexual Activity   Alcohol use: No   Drug use: No   Sexual activity: Yes  Other Topics Concern   Not on file  Social History Narrative   Not on file   Social Determinants of Health   Financial Resource Strain: Not on file  Food Insecurity: Not on file  Transportation Needs: No Transportation Needs (02/03/2022)   PRAPARE - Hydrologist (Medical): No    Lack of Transportation (Non-Medical): No  Physical Activity: Sufficiently Active (02/03/2022)   Exercise Vital Sign    Days of Exercise per Week: 4 days    Minutes of Exercise per Session: 60 min  Stress: Not on file  Social Connections: Moderately Integrated (02/03/2022)   Social Connection and Isolation Panel [NHANES]    Frequency of Communication with Friends and Family: More than three times a week    Frequency of Social Gatherings with Friends and Family: More than three times a week    Attends Religious Services: More than 4 times per year    Active Member of [employer] or Organizations: No    Attends Archivist Meetings: Never    Marital Status: Married  Human resources officer Violence: Not At Risk (02/03/2022)   Humiliation, Afraid, Rape, and Kick questionnaire    Fear of Current or Ex-Partner: No    Emotionally Abused: No    Physically Abused: No    Sexually Abused: No    FAMILY HISTORY:  Family History  Problem Relation Age of Onset   Diabetes Mother    Hypertension Mother    Obesity Mother    Heart disease Mother    Mental illness Mother    Hypertension Sister    Obesity Sister    Arthritis Sister    Deep vein thrombosis Father    Dementia Father    Heart disease Father        CABG   Colon cancer Neg Hx     CURRENT MEDICATIONS:  Current Outpatient Medications  Medication Sig Dispense Refill   acetaminophen (TYLENOL) 500 MG tablet Take 1,000 mg by mouth every 6 (six) hours as needed for mild pain or moderate pain.     acyclovir (ZOVIRAX) 400 MG tablet TAKE 1  TABLET BY MOUTH TWICE A DAY 180 tablet 2   aspirin EC 81 MG tablet Take 81 mg by mouth daily. Swallow whole.     benzonatate (TESSALON) 100 MG capsule Take 1 capsule (100 mg total) by mouth 2 (two) times daily as needed for cough. 20 capsule 0   Carfilzomib (KYPROLIS IV) Inject into the vein once a week.     Cholecalciferol (VITAMIN D) 2000 units CAPS Take 2,000 Units by mouth every other day.     daratumumab-hyaluronidase-fihj (DARZALEX FASPRO) 1800-30000 MG-UT/15ML SOLN Inject 1,800 mg into the skin once a week.     lenalidomide (REVLIMID) 15 MG capsule Take 1 capsule (15 mg total) by mouth daily. Take for 21 days, then hold  for 7 days. Repeat every 28 days. 21 capsule 0   lidocaine-prilocaine (EMLA) cream Apply a small amount to port a cath site and cover with plastic wrap (do not rub in) 1 hour prior to infusion appointments 30 g 0   methocarbamol (ROBAXIN) 500 MG tablet Take 1 tablet (500 mg total) by mouth 3 (three) times daily. 60 tablet 1   Multiple Vitamin (MULTIVITAMIN WITH MINERALS) TABS tablet Take 1 tablet by mouth daily with breakfast. Centrum Silver for Men     nirmatrelvir & ritonavir (PAXLOVID, 300/100,) 20 x 150 MG & 10 x 100MG TBPK Take pack as directed.  Do not take Revlamid while on therapy and resume day after last dose. 1 each 0   nitroGLYCERIN (NITROSTAT) 0.4 MG SL tablet Place 1 tablet (0.4 mg total) under the tongue every 5 (five) minutes as needed for chest pain. 30 tablet 0   pantoprazole (PROTONIX) 40 MG tablet TAKE 1 TABLET BY MOUTH EVERY OTHER DAY 45 tablet 1   prochlorperazine (COMPAZINE) 10 MG tablet Take 1 tablet (10 mg total) by mouth every 6 (six) hours as needed for nausea or vomiting. 30 tablet 3   tamsulosin (FLOMAX) 0.4 MG CAPS capsule Take 1 capsule (0.4 mg total) by mouth daily. 30 capsule 11   traMADol (ULTRAM) 50 MG tablet Take 1 tablet (50 mg total) by mouth every 6 (six) hours as needed. 10 tablet 0   No current facility-administered medications for this  visit.   Facility-Administered Medications Ordered in Other Visits  Medication Dose Route Frequency Provider Last Rate Last Admin   acetaminophen (TYLENOL) 325 MG tablet            diphenhydrAMINE (BENADRYL) 25 mg capsule             ALLERGIES:  Allergies  Allergen Reactions   Sulfa Antibiotics Other (See Comments)    Unknown reaction. Childhood reaction    PHYSICAL EXAM:  Performance status (ECOG): 1 - Symptomatic but completely ambulatory  There were no vitals filed for this visit.  Wt Readings from Last 3 Encounters:  10/28/22 189 lb 3.2 oz (85.8 kg)  10/28/22 189 lb 3.2 oz (85.8 kg)  10/14/22 189 lb 2.5 oz (85.8 kg)   Physical Exam Vitals reviewed.  Constitutional:      Appearance: Normal appearance.  Cardiovascular:     Rate and Rhythm: Normal rate and regular rhythm.     Pulses: Normal pulses.     Heart sounds: Normal heart sounds.  Pulmonary:     Effort: Pulmonary effort is normal.     Breath sounds: Normal breath sounds.  Musculoskeletal:     Right lower leg: No edema.     Left lower leg: No edema.  Neurological:     General: No focal deficit present.     Mental Status: He is alert and oriented to person, place, and time.  Psychiatric:        Mood and Affect: Mood normal.        Behavior: Behavior normal.     LABORATORY DATA:  I have reviewed the labs as listed.     Latest Ref Rng & Units 10/21/2022    3:25 PM 10/14/2022    8:02 AM 10/07/2022    7:59 AM  CBC  WBC 4.0 - 10.5 K/uL 2.7  3.1  2.5   Hemoglobin 13.0 - 17.0 g/dL 10.5  10.6  10.1   Hematocrit 39.0 - 52.0 % 31.0  31.6  30.1   Platelets 150 -  400 K/uL 143  155  110       Latest Ref Rng & Units 10/21/2022    3:25 PM 10/14/2022    8:02 AM 10/07/2022    7:59 AM  CMP  Glucose 70 - 99 mg/dL 112  92  84   BUN 8 - 23 mg/dL _0 Creatinine 0.61 - 1.24 mg/dL 1.16  1.06  1.05   Sodium 135 - 145 mmol/L 137  142  141   Potassium 3.5 - 5.1 mmol/L 3.6  3.9  3.7   Chloride 98 - 111  mmol/L 108  112  110   CO2 22 - 32 mmol/L _1 Calcium 8.9 - 10.3 mg/dL 7.9  8.1  7.9   Total Protein 6.5 - 8.1 g/dL 5.7  5.5  5.7   Total Bilirubin 0.3 - 1.2 mg/dL 0.8  0.5  0.5   Alkaline Phos 38 - 126 U/L 65  59  64   AST 15 - 41 U/L _2 ALT 0 - 44 U/L _3 DIAGNOSTIC IMAGING:  I have independently reviewed the scans and discussed with the patient. No results found.   ASSESSMENT:  Prostate cancer: - TRUS/Bx on 06/14/2015: 1/12 cores positive for adenocarcinoma-10% of the core in the right lateral base revealed Gleason 3+3=6.  Prostate volume 175 cc.  PSA was 16.6.  PSA density 0.09. - Seen by Dr. Louis Meckel in [facility] for robotic prostatectomy.  Due to low-volume prostate cancer and very low PSA density, it was recommended that he continue active surveillance. - 01/21/2016: Surveillance TRUS/biopsy, following a prostatic MRI which revealed no suspicious prostatic lesions and correlated the patient's large prostate.  All 12 cores were negative for prostate cancer.  Prostate volume was 190 mL. - 08/11/2018: Fusion TRUS/biopsy: Recent prostate MRI-volume 220 mL, no significant lesions.  No evidence of transcapsular/.  Ureteral, SV/bony or lymphatic disease.  Path-1 core (left apex) revealed Gleason 3+3 and 5% of core. - PSA: 17.16 (10/09/2015), 15.4 (11/04/2016), 17.9 (05/15/2017), 18.4 (04/19/2018), 15.9 (02/15/2019), 15.5 (03/20/2020), 22.4 (03/20/2021) - MRI of the lumbar spine with and without contrast on 11/27/2021 done for back pain showed expanded appearance of the sacrum with diffusely abnormal bone marrow signal and contrast-enhancement concerning for metastatic disease. - CT pelvis with contrast on 11/27/2021: Expansile, heterogeneous trabeculated appearance of the sacrum with thickening of the cortex, unchanged from multiple prior exams.  No evidence of pelvic lymphadenopathy.  Extreme prostatomegaly. -PSMA PET scan (03/13/2022): Markedly enlarged prostate gland with  focal activity inferior left aspect of the gland suggesting prostatic adenocarcinoma.  No nodal metastasis in the abdomen or pelvis.  Multifocal skeletal metastasis in the mid sacrum, right sacral ala, subtle lesions in the L2 and several rib lesions. - Degarelix started on 04/15/2022   Social/family history: - Lives at home with his wife.  Retired in 2003 from [employer].  He works part-time work at an [employer].  Non-smoker. - Sister had cholangiocarcinoma.  Paternal first cousin had metastatic cancer.  Paternal uncle had prostate cancer.  3.  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  Core biopsy demonstrates hypercellular marrow (95%) with large atypical plasma cells arranged in sheets, by CD138 IHC plasma cells comprise 70% of the cellular elements, kappa restricted.  Amyloid was negative. 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  Chronic progressive changes involving the sacrum, left iliac bone including accentuation of trabecular markings and cortical thickening with low FDG uptake suggestive of chronic Paget's disease.  Marked prostate gland enlargement. - BMBX (03/20/2022):Hypercellular bone marrow with at least 25% plasma cells on aspirate with atypical features.  Core biopsy demonstrates hypercellular marrow for age (95%) with large atypical plasma cells arranged in sheets.  By CD138 IHC, plasma cells comprise approximately 70% of cellular elements and are kappa restricted.  Amyloid deposition was negative by Congo red.  Karyotype showed 20 q. deletion.  Myeloma FISH panel showed complex abnormalities. 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  PSA is 2.12, improved. - He has mild hot flashes which are stable.  2.  IgG kappa multiple myeloma with complex cytogenetics: - He is tolerating Revlimid 15 mg 3 weeks on/1 week off very well. - Myeloma labs from 10/21/2022: M spike 0.1 g and stable.  Free light chain ratio 1.26 and normal. - Proceed with C8-D1 today.  RTC 4 weeks with repeat myeloma labs.  We will do Dara specific immunofixation at next visit. - He met with Dr. Feliciana Rossetti on 10/09/2022.  They are planning to collect stem cells after insurance authorization and likely proceed with bone marrow transplant early next year.  3.  Normocytic anemia: - Hemoglobin is 10.5 today with MCV 95 from myelosuppression.  4.  Osteopenia (DEXA scan 06/04/2022: T score -2.1): - He is tolerating denosumab very well.  Calcium today 7.9 with albumin 3.5.   Orders placed this encounter:  Orders Placed This Encounter  Procedures   Miscellaneous [facility] test (send-out)      [name], MD [facility] [phone]

## 2022-10-28 NOTE — Telephone Encounter (Signed)
Chart reviewed. Revlimid refilled per last office note with Dr. Katragadda.  

## 2022-10-28 NOTE — Patient Instructions (Addendum)
[facility] at [facility] Discharge Instructions   You were seen and examined today by Dr. Katragadda.  He reviewed the results of your lab work which are normal/stable.   We will proceed with your treatment today.  Return as scheduled.    Thank you for choosing [facility] at [facility] to provide your oncology and hematology care.  To afford each patient quality time with our provider, please arrive at least 15 minutes before your scheduled appointment time.   If you have a lab appointment with the Cancer Center please come in thru the Main Entrance and check in at the main information desk.  You need to re-schedule your appointment should you arrive 10 or more minutes late.  We strive to give you quality time with our providers, and arriving late affects you and other patients whose appointments are after yours.  Also, if you no show three or more times for appointments you may be dismissed from the clinic at the providers discretion.     Again, thank you for choosing [facility].  Our hope is that these requests will decrease the amount of time that you wait before being seen by our physicians.       _____________________________________________________________  Should you have questions after your visit to [facility], please contact our office at [phone] and follow the prompts.  Our office hours are 8:00 a.m. and 4:30 p.m. Monday - Friday.  Please note that voicemails left after 4:00 p.m. may not be returned until the following business day.  We are closed weekends and major holidays.  You do have access to a nurse 24-7, just call the main number to the clinic [phone] and do not press any options, hold on the line and a nurse will answer the phone.    For prescription refill requests, have your pharmacy contact our office and allow 72 hours.    Due to Covid, you will need to wear a mask upon entering  the hospital. If you do not have a mask, a mask will be given to you at the Main Entrance upon arrival. For doctor visits, patients may have 1 support person age 18 or older with them. For treatment visits, patients can not have anyone with them due to social distancing guidelines and our immunocompromised population.      

## 2022-10-28 NOTE — Patient Instructions (Signed)
[facility]  Discharge Instructions: Thank you for choosing [facility] to provide your oncology and hematology care.  If you have a lab appointment with the [facility], please come in thru the Main Entrance and check in at the main information desk.  Wear comfortable clothing and clothing appropriate for easy access to any Portacath or PICC line.   We strive to give you quality time with your provider. You may need to reschedule your appointment if you arrive late (15 or more minutes).  Arriving late affects you and other patients whose appointments are after yours.  Also, if you miss three or more appointments without notifying the office, you may be dismissed from the clinic at the provider's discretion.      For prescription refill requests, have your pharmacy contact our office and allow 72 hours for refills to be completed.    Today you received the following chemotherapy and/or immunotherapy agents Toney Sang, Delton See, and [place], return as scheduled.   To help prevent nausea and vomiting after your treatment, we encourage you to take your nausea medication as directed.  BELOW ARE SYMPTOMS THAT SHOULD BE REPORTED IMMEDIATELY: *FEVER GREATER THAN 100.4 F (38 C) OR HIGHER *CHILLS OR SWEATING *NAUSEA AND VOMITING THAT IS NOT CONTROLLED WITH YOUR NAUSEA MEDICATION *UNUSUAL SHORTNESS OF BREATH *UNUSUAL BRUISING OR BLEEDING *URINARY PROBLEMS (pain or burning when urinating, or frequent urination) *BOWEL PROBLEMS (unusual diarrhea, constipation, pain near the anus) TENDERNESS IN MOUTH AND THROAT WITH OR WITHOUT PRESENCE OF ULCERS (sore throat, sores in mouth, or a toothache) UNUSUAL RASH, SWELLING OR PAIN  UNUSUAL VAGINAL DISCHARGE OR ITCHING   Items with * indicate a potential emergency and should be followed up as soon as possible or go to the Emergency Department if any problems should occur.  Please show the CHEMOTHERAPY ALERT CARD or  IMMUNOTHERAPY ALERT CARD at check-in to the Emergency Department and triage nurse.  Should you have questions after your visit or need to cancel or reschedule your appointment, please contact [facility] [phone]  and follow the prompts.  Office hours are 8:00 a.m. to 4:30 p.m. Monday - Friday. Please note that voicemails left after 4:00 p.m. may not be returned until the following business day.  We are closed weekends and major holidays. You have access to a nurse at all times for urgent questions. Please call the main number to the clinic [phone] and follow the prompts.  For any non-urgent questions, you may also contact your provider using MyChart. We now offer e-Visits for anyone 61 and older to request care online for non-urgent symptoms. For details visit [email or web address].   Also download the MyChart app! Go to the app store, search "MyChart", open the app, select [facility], and log in with your MyChart username and password.  Masks are optional in the cancer centers. If you would like for your care team to wear a mask while they are taking care of you, please let them know. You may have one support person who is at least 72 years old accompany you for your appointments.

## 2022-10-28 NOTE — Progress Notes (Signed)
History of Present Illness:   Prior history of elevated PSA--s/p TRUS/Bx in [place] in 2005 and 2008. Apparently, all cores were negative but some were not indicative of prostate tissue at all.   Because of a high PCA 3 test in this office, he underwent TRUS/Bx on 6.23.2016. 1/12 cores were positive for adenocarcinoma-10% of the core in the right lateral base revealed Gleason 3+3 equal 6 adenocarcinoma. Prostate volume was 175 cc. PSA was 16.66. PSA density 0.09. He does not have significant lower urinary tract symptomatology.   He consulted with Dr. Louis Meckel in [place] to consider robotic prostatectomy. Due to his low volume prostate cancer, and very low PSA density, it was recommended that he continue with active surveillance.   1.30.2017: Surveillance TRUS/Bx , following a prostatic MRI which revealed no suspicious prostatic lesions and correlated the patient's large prostate. At that time, all 12 cores were negative for prostate cancer. Prostatic volume was 190 mL.   8.21.2019: fusion TRUS/Bx. Recent prostate MRI--volume 220 ml. No significant lesions. No evidence transcapsular/perineural,SV/bony or lymphatic disease. Path--1 core (left apex) revealed GS 3+3 pattern in 5% of core.    PSA followup:   10.18.2016--17.16 11.14.2017--15.4 5.25.2018--17.9 4.29.2019--18.4 2.25.2020--15.9 3.30.2021--15.5 3.30.2022--22.4   9.24.2021 MRI prostate. Prostate volume 250 mL. No significant change compared to prior exam. No radiographic evidence of high-grade prostate carcinoma. PI-RADS 2: Low (clinically significant cancer is unlikely to be present)   4.5.2022: PSA 22.4.   10.4.2022:  PSA 34.5 this was drawn while the patient was being treated for an active COVID infection.  He was put on finasteride after his last visit.  He was unable to tolerate it due to worsening of his lower urinary tract symptoms.  3.23.2023: PSMA PET scan-- 1. Multifocal radiotracer avid prostate cancer skeletal  metastasis. Broad lytic lesion within the sacrum with associated radiotracer activity. Multiple discrete lesions in the thoracic lumbar spine and ribs with minimal CT change. 2. Markedly enlarged prostate gland. Focal activity inferior LEFT aspect the gland is suggestive of prostate adenocarcinoma. 3. No nodal metastasis in the abdomen pelvis. No visceral metastasis.   11.7.2023: Here for recheck.  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  He is receiving regular infusions for multiple myeloma.  He is having hot flashes and low energy level which may be attributable to his androgen deprivation therapy.  PSA prior to initiation of ADT was around 27, most recently 2.1.  Past Medical History:  Diagnosis Date   Arthritis    Asbestosis ([place])    Closed fracture of distal end of fibula with tibia with routine healing 05/28/2020   COVID-19 09/03/2021   Cyst of kidney, acquired    Headache    History of fracture of leg    Right, childhood   Hx of migraine headaches    Hyperlipidemia    Neuropathy    PE (pulmonary thromboembolism) ([place])    After knee surgery   Prostate cancer ([place]) 2015   Adenocarcinoma by biopsy    Past Surgical History:  Procedure Laterality Date   BIOPSY PROSTATE  2003 and 2005   COLONOSCOPY  11/28/2008   Dr. Rourk:internal hemorrhoids/tortus colon/ascending colon polyps, tubular adenomas   COLONOSCOPY N/A 09/04/2014   Procedure: COLONOSCOPY;  Surgeon: Daneil Dolin, MD;  [facility];  Service: Endoscopy;  Laterality: N/A;  9:30   COLONOSCOPY N/A 07/17/2017   Procedure: COLONOSCOPY;  Surgeon: Danie Binder, MD;  [facility];  Service: Endoscopy;  Laterality: N/A;  ESOPHAGOGASTRODUODENOSCOPY N/A 07/16/2017   Procedure: ESOPHAGOGASTRODUODENOSCOPY (EGD);  Surgeon: Danie Binder, MD;  [facility];  Service: Endoscopy;  Laterality: N/A;   GIVENS CAPSULE STUDY  07/16/2017   Procedure: GIVENS CAPSULE STUDY;   Surgeon: Danie Binder, MD;  [facility];  Service: Endoscopy;;   JOINT REPLACEMENT N/A    Phreesia 01/12/2021   POLYPECTOMY  07/17/2017   Procedure: POLYPECTOMY;  Surgeon: Danie Binder, MD;  [facility];  Service: Endoscopy;;  cecal   PORTACATH PLACEMENT Left 04/18/2022   Procedure: INSERTION PORT-A-CATH;  Surgeon: Aviva Signs, MD;  Location: [facility];  Service: General;  Laterality: Left;   TOTAL KNEE ARTHROPLASTY Right 06/10/2017   TOTAL KNEE ARTHROPLASTY Right 06/10/2017   Procedure: TOTAL KNEE ARTHROPLASTY;  Surgeon: [name], MD;  Location: [facility];  Service: Orthopedics;  Laterality: Right;    Home Medications:  Allergies as of 10/28/2022       Reactions   Sulfa Antibiotics Other (See Comments)   Unknown reaction. Childhood reaction        Medication List        Accurate as of October 28, 2022  8:47 AM. If you have any questions, ask your nurse or doctor.          acetaminophen 500 MG tablet Commonly known as: TYLENOL Take 1,000 mg by mouth every 6 (six) hours as needed for mild pain or moderate pain.   acyclovir 400 MG tablet Commonly known as: ZOVIRAX TAKE 1 TABLET BY MOUTH TWICE A DAY   aspirin EC 81 MG tablet Take 81 mg by mouth daily. Swallow whole.   benzonatate 100 MG capsule Commonly known as: TESSALON Take 1 capsule (100 mg total) by mouth 2 (two) times daily as needed for cough.   daratumumab-hyaluronidase-fihj 1800-30000 MG-UT/15ML Soln Commonly known as: DARZALEX FASPRO Inject 1,800 mg into the skin once a week.   KYPROLIS IV Inject into the vein once a week.   lenalidomide 15 MG capsule Commonly known as: REVLIMID Take 1 capsule (15 mg total) by mouth daily. Take for 21 days, then hold for 7 days. Repeat every 28 days.   lidocaine-prilocaine cream Commonly known as: EMLA Apply a small amount to port a cath site and cover with plastic wrap (do not rub in) 1 hour prior to infusion appointments    methocarbamol 500 MG tablet Commonly known as: ROBAXIN Take 1 tablet (500 mg total) by mouth 3 (three) times daily.   multivitamin with minerals Tabs tablet Take 1 tablet by mouth daily with breakfast. Centrum Silver for Men   nitroGLYCERIN 0.4 MG SL tablet Commonly known as: NITROSTAT Place 1 tablet (0.4 mg total) under the tongue every 5 (five) minutes as needed for chest pain.   pantoprazole 40 MG tablet Commonly known as: PROTONIX TAKE 1 TABLET BY MOUTH EVERY OTHER DAY   Paxlovid (300/100) 20 x 150 MG & 10 x 100MG Tbpk Generic drug: nirmatrelvir & ritonavir Take pack as directed.  Do not take Revlamid while on therapy and resume day after last dose.   prochlorperazine 10 MG tablet Commonly known as: COMPAZINE Take 1 tablet (10 mg total) by mouth every 6 (six) hours as needed for nausea or vomiting.   tamsulosin 0.4 MG Caps capsule Commonly known as: FLOMAX Take 1 capsule (0.4 mg total) by mouth daily.   traMADol 50 MG tablet Commonly known as: Ultram Take 1 tablet (50 mg total) by mouth every 6 (six) hours as needed.  Vitamin D 50 MCG (2000 UT) Caps Take 2,000 Units by mouth every other day.        Allergies:  Allergies  Allergen Reactions   Sulfa Antibiotics Other (See Comments)    Unknown reaction. Childhood reaction    Family History  Problem Relation Age of Onset   Diabetes Mother    Hypertension Mother    Obesity Mother    Heart disease Mother    Mental illness Mother    Hypertension Sister    Obesity Sister    Arthritis Sister    Deep vein thrombosis Father    Dementia Father    Heart disease Father        CABG   Colon cancer Neg Hx     Social History:  reports that he has never smoked. He has never used smokeless tobacco. He reports that he does not drink alcohol and does not use drugs.  ROS: A complete review of systems was performed.  All systems are negative except for pertinent findings as noted.  Physical Exam:  Vital signs in  last 24 hours: There were no vitals taken for this visit. Constitutional:  Alert and oriented, No acute distress Cardiovascular: Regular rate  Respiratory: Normal respiratory effort  I have reviewed prior pt notes  I have reviewed notes from referring/previous physicians-Dr. Delton Coombes  I have reviewed urinalysis results  I have independently reviewed prior imaging-prior MRI readings, recent PSMA PET/CT  I have reviewed prior PSA results     Impression/Assessment:  History of low volume low risk prostate cancer, now with metastatic process in his bones.  This may be prostate cancer, he is on androgen deprivation therapy.  However, I am concerned that his whole process here in the bones may be myeloma in nature.  He does have a very large prostate, and PSA density and him despite his PSA of being 26 is still fairly low.  Overall he is tolerating therapy for the myeloma and the prostate cancer well  Plan:  I did let him know that there is a chance that the prostate PET scan was false positive.  I would leave it up to Dr. Delton Coombes to discern whether to continue his [place] or not.  Overall he seems to tolerate it well, however.  I will have him come back to see me on a 3-monthbasis for routine check

## 2022-10-28 NOTE — Progress Notes (Signed)
Patient with no infusion reactions. Will decrease diphenhydramine to 25 mg po x 1 due to age.  Ok for xgeva with corrected calcium of 8.3.  T.O. Dr Rhys Martini, PharmD

## 2022-10-28 NOTE — Progress Notes (Signed)
Patient has been examined by Dr. Katragadda, and vital signs and labs have been reviewed. ANC, Creatinine, LFTs, hemoglobin, and platelets are within treatment parameters per M.D. - pt may proceed with treatment.  Primary RN and pharmacy notified.  

## 2022-10-28 NOTE — Progress Notes (Signed)
Patient presented today for Druscilla Brownie, [place] and Delton See. Patient and labs assessed by Dr. Delton Coombes, patient okay for treatment.  Patient tolerated Daratumumab injection with no complaints voiced. See MAR for details. Lab reviewed. Injection site clean and dry with no bruising or swelling noted at site. Band aid applied.  Patient tolerated chemotherapy with no complaints voiced. Side effects with management reviewed understanding verbalized. Port site clean and dry with no bruising or swelling noted at site. Good blood return noted before and after administration of chemotherapy. Band aid applied.  Patient taking calcium as directed. Denied tooth, jaw, and leg pain. No recent or upcoming dental visits. Labs reviewed. Patient tolerated injection with no complaints voiced. See MAR for details. Patient stable during and after injection. Site clean and dry with no bruising or swelling noted. Band aid applied. Vss with discharge and left in satisfactory condition with no s/s of distress.

## 2022-10-29 LAB — URINALYSIS, ROUTINE W REFLEX MICROSCOPIC
Bilirubin, UA: NEGATIVE
Glucose, UA: NEGATIVE
Ketones, UA: NEGATIVE
Leukocytes,UA: NEGATIVE
Nitrite, UA: NEGATIVE
Protein,UA: NEGATIVE
Specific Gravity, UA: 1.015 (ref 1.005–1.030)
Urobilinogen, Ur: 0.2 mg/dL (ref 0.2–1.0)
pH, UA: 5 (ref 5.0–7.5)

## 2022-10-29 LAB — MICROSCOPIC EXAMINATION
Bacteria, UA: NONE SEEN
Epithelial Cells (non renal): NONE SEEN /hpf (ref 0–10)

## 2022-10-30 ENCOUNTER — Other Ambulatory Visit: Payer: Self-pay

## 2022-10-30 ENCOUNTER — Other Ambulatory Visit (HOSPITAL_COMMUNITY): Payer: Self-pay

## 2022-10-31 ENCOUNTER — Other Ambulatory Visit (HOSPITAL_COMMUNITY): Payer: Self-pay

## 2022-11-04 ENCOUNTER — Inpatient Hospital Stay: Payer: Medicare HMO

## 2022-11-04 VITALS — BP 147/71 | HR 47 | Temp 98.0°F | Resp 16

## 2022-11-04 DIAGNOSIS — Z5112 Encounter for antineoplastic immunotherapy: Secondary | ICD-10-CM | POA: Diagnosis not present

## 2022-11-04 DIAGNOSIS — Z5111 Encounter for antineoplastic chemotherapy: Secondary | ICD-10-CM | POA: Diagnosis not present

## 2022-11-04 DIAGNOSIS — Z79899 Other long term (current) drug therapy: Secondary | ICD-10-CM | POA: Diagnosis not present

## 2022-11-04 DIAGNOSIS — C9000 Multiple myeloma not having achieved remission: Secondary | ICD-10-CM

## 2022-11-04 DIAGNOSIS — C7951 Secondary malignant neoplasm of bone: Secondary | ICD-10-CM | POA: Diagnosis not present

## 2022-11-04 DIAGNOSIS — C61 Malignant neoplasm of prostate: Secondary | ICD-10-CM | POA: Diagnosis not present

## 2022-11-04 DIAGNOSIS — C9002 Multiple myeloma in relapse: Secondary | ICD-10-CM | POA: Diagnosis not present

## 2022-11-04 LAB — CBC WITH DIFFERENTIAL/PLATELET
Abs Immature Granulocytes: 0 10*3/uL (ref 0.00–0.07)
Basophils Absolute: 0 10*3/uL (ref 0.0–0.1)
Basophils Relative: 1 %
Eosinophils Absolute: 0.4 10*3/uL (ref 0.0–0.5)
Eosinophils Relative: 16 %
HCT: 30.2 % — ABNORMAL LOW (ref 39.0–52.0)
Hemoglobin: 10.2 g/dL — ABNORMAL LOW (ref 13.0–17.0)
Immature Granulocytes: 0 %
Lymphocytes Relative: 22 %
Lymphs Abs: 0.6 10*3/uL — ABNORMAL LOW (ref 0.7–4.0)
MCH: 32.2 pg (ref 26.0–34.0)
MCHC: 33.8 g/dL (ref 30.0–36.0)
MCV: 95.3 fL (ref 80.0–100.0)
Monocytes Absolute: 0.4 10*3/uL (ref 0.1–1.0)
Monocytes Relative: 14 %
Neutro Abs: 1.2 10*3/uL — ABNORMAL LOW (ref 1.7–7.7)
Neutrophils Relative %: 47 %
Platelets: 111 10*3/uL — ABNORMAL LOW (ref 150–400)
RBC: 3.17 MIL/uL — ABNORMAL LOW (ref 4.22–5.81)
RDW: 15.7 % — ABNORMAL HIGH (ref 11.5–15.5)
WBC: 2.6 10*3/uL — ABNORMAL LOW (ref 4.0–10.5)
nRBC: 0 % (ref 0.0–0.2)

## 2022-11-04 LAB — COMPREHENSIVE METABOLIC PANEL
ALT: 28 U/L (ref 0–44)
AST: 22 U/L (ref 15–41)
Albumin: 3.4 g/dL — ABNORMAL LOW (ref 3.5–5.0)
Alkaline Phosphatase: 50 U/L (ref 38–126)
Anion gap: 6 (ref 5–15)
BUN: 17 mg/dL (ref 8–23)
CO2: 23 mmol/L (ref 22–32)
Calcium: 8.6 mg/dL — ABNORMAL LOW (ref 8.9–10.3)
Chloride: 112 mmol/L — ABNORMAL HIGH (ref 98–111)
Creatinine, Ser: 1.08 mg/dL (ref 0.61–1.24)
GFR, Estimated: >60 mL/min (ref >60)
Glucose, Bld: 89 mg/dL (ref 70–99)
Potassium: 3.5 mmol/L (ref 3.5–5.1)
Sodium: 141 mmol/L (ref 135–145)
Total Bilirubin: 0.7 mg/dL (ref 0.3–1.2)
Total Protein: 5.6 g/dL — ABNORMAL LOW (ref 6.5–8.1)

## 2022-11-04 LAB — MAGNESIUM: Magnesium: 1.9 mg/dL (ref 1.7–2.4)

## 2022-11-04 MED ORDER — HEPARIN SOD (PORK) LOCK FLUSH 100 UNIT/ML IV SOLN
500.0000 [IU] | Freq: Once | INTRAVENOUS | Status: AC | PRN
  Administered 2022-11-04: 500 [IU]

## 2022-11-04 MED ORDER — SODIUM CHLORIDE 0.9% FLUSH
10.0000 mL | INTRAVENOUS | Status: DC | PRN
  Administered 2022-11-04: 10 mL

## 2022-11-04 MED ORDER — DEXTROSE 5 % IV SOLN
56.0000 mg/m2 | Freq: Once | INTRAVENOUS | Status: AC
  Administered 2022-11-04: 110 mg via INTRAVENOUS
  Filled 2022-11-04: qty 15

## 2022-11-04 MED ORDER — SODIUM CHLORIDE 0.9 % IV SOLN: Freq: Once | INTRAVENOUS | Status: AC

## 2022-11-04 MED ORDER — SODIUM CHLORIDE 0.9 % IV SOLN
20.0000 mg | Freq: Once | INTRAVENOUS | Status: AC
  Administered 2022-11-04: 20 mg via INTRAVENOUS
  Filled 2022-11-04: qty 2

## 2022-11-04 NOTE — Progress Notes (Signed)
Patient presents today for chemotherapy infusion.  Patient is in satisfactory condition with no new complaints voiced.  Vital signs are stable.  Labs reviewed.  ANC today is 1.2.  All other labs are within treatment parameters. We will proceed with treatment per MD orders.   Patient tolerated treatment well with no complaints voiced.  Patient left ambulatory in stable condition.  Vital signs stable at discharge.  Follow up as scheduled.

## 2022-11-04 NOTE — Progress Notes (Signed)
Patients port flushed without difficulty.  Good blood return noted with no bruising or swelling noted at site.  Stable during access and blood draw.  Patient to remain accessed for treatment. 

## 2022-11-04 NOTE — Patient Instructions (Signed)
[facility]  Discharge Instructions: Thank you for choosing [facility] to provide your oncology and hematology care.  If you have a lab appointment with the [facility], please come in thru the Main Entrance and check in at the main information desk.  Wear comfortable clothing and clothing appropriate for easy access to any Portacath or PICC line.   We strive to give you quality time with your provider. You may need to reschedule your appointment if you arrive late (15 or more minutes).  Arriving late affects you and other patients whose appointments are after yours.  Also, if you miss three or more appointments without notifying the office, you may be dismissed from the clinic at the provider's discretion.      For prescription refill requests, have your pharmacy contact our office and allow 72 hours for refills to be completed.    Today you received the following chemotherapy and/or immunotherapy agents Kyprolis.  Carfilzomib Injection What is this medication? CARFILZOMIB (kar FILZ oh mib) treats multiple myeloma, a type of bone marrow cancer. It works by blocking a protein that causes cancer cells to grow and multiply. This helps to slow or stop the spread of cancer cells. This medicine may be used for other purposes; ask your health care provider or pharmacist if you have questions. COMMON BRAND NAME(S): KYPROLIS What should I tell my care team before I take this medication? They need to know if you have any of these conditions: Heart disease History of blood clots Irregular heartbeat Kidney disease Liver disease Lung or breathing disease An unusual or allergic reaction to carfilzomib, or other medications, foods, dyes, or preservatives If you or your partner are pregnant or trying to get pregnant Breastfeeding How should I use this medication? This medication is injected into a vein. It is given by your care team in a hospital or clinic  setting. Talk to your care team about the use of this medication in children. Special care may be needed. Overdosage: If you think you have taken too much of this medicine contact a poison control center or emergency room at once. NOTE: This medicine is only for you. Do not share this medicine with others. What if I miss a dose? Keep appointments for follow-up doses. It is important not to miss your dose. Call your care team if you are unable to keep an appointment. What may interact with this medication? Interactions are not expected. This list may not describe all possible interactions. Give your health care provider a list of all the medicines, herbs, non-prescription drugs, or dietary supplements you use. Also tell them if you smoke, drink alcohol, or use illegal drugs. Some items may interact with your medicine. What should I watch for while using this medication? Your condition will be monitored carefully while you are receiving this medication. You may need blood work while taking this medication. Check with your care team if you have severe diarrhea, nausea, and vomiting, or if you sweat a lot. The loss of too much body fluid may make it dangerous for you to take this medication. This medication may affect your coordination, reaction time, or judgment. Do not drive or operate machinery until you know how this medication affects you. Sit up or stand slowly to reduce the risk of dizzy or fainting spells. Drinking alcohol with this medication can increase the risk of these side effects. Talk to your care team if you may be pregnant. Serious birth defects can  occur if you take this medication during pregnancy and for 6 months after the last dose. You will need a negative pregnancy test before starting this medication. Contraception is recommended while taking this medication and for 6 months after the last dose. Your care team can help you find an option that works for you. If your partner can get  pregnant, use a condom during sex while taking this medication and for 3 months after the last dose. Do not breastfeed while taking this medication and for 2 weeks after the last dose. This medication may cause infertility. Talk to your care team if you are concerned about your fertility. What side effects may I notice from receiving this medication? Side effects that you should report to your care team as soon as possible: Allergic reactions--skin rash, itching, hives, swelling of the face, lips, tongue, or throat Bleeding--bloody or black, tar-like stools, vomiting blood or Adaliah Hiegel material that looks like coffee grounds, red or dark Clifton Kovacic urine, small red or purple spots on skin, unusual bruising or bleeding Blood clot--pain, swelling, or warmth in the leg, shortness of breath, chest pain Dizziness, loss of balance or coordination, confusion or trouble speaking Heart attack--pain or tightness in the chest, shoulders, arms, or jaw, nausea, shortness of breath, cold or clammy skin, feeling faint or lightheaded Heart failure--shortness of breath, swelling of the ankles, feet, or hands, sudden weight gain, unusual weakness or fatigue Heart rhythm changes--fast or irregular heartbeat, dizziness, feeling faint or lightheaded, chest pain, trouble breathing Increase in blood pressure Infection--fever, chills, cough, sore throat, wounds that don't heal, pain or trouble when passing urine, general feeling of discomfort or being unwell Infusion reactions--chest pain, shortness of breath or trouble breathing, feeling faint or lightheaded Kidney injury--decrease in the amount of urine, swelling of the ankles, hands, or feet Liver injury--right upper belly pain, loss of appetite, nausea, light-colored stool, dark yellow or Connelly Spruell urine, yellowing skin or eyes, unusual weakness or fatigue Lung injury--shortness of breath or trouble breathing, cough, spitting up blood, chest pain, fever Pulmonary  hypertension--shortness of breath, chest pain, fast or irregular heartbeat, feeling faint or lightheaded, fatigue, swelling of the ankles or feet Stomach pain, bloody diarrhea, pale skin, unusual weakness or fatigue, decrease in the amount of urine, which may be signs of hemolytic uremic syndrome Sudden and severe headache, confusion, change in vision, seizures, which may be signs of posterior reversible encephalopathy syndrome (PRES) TTP--purple spots on the skin or inside the mouth, pale skin, yellowing skin or eyes, unusual weakness or fatigue, fever, fast or irregular heartbeat, confusion, change in vision, trouble speaking, trouble walking Tumor lysis syndrome (TLS)--nausea, vomiting, diarrhea, decrease in the amount of urine, dark urine, unusual weakness or fatigue, confusion, muscle pain or cramps, fast or irregular heartbeat, joint pain Side effects that usually do not require medical attention (report to your care team if they continue or are bothersome): Diarrhea Fatigue Nausea Trouble sleeping This list may not describe all possible side effects. Call your doctor for medical advice about side effects. You may report side effects to FDA at 1-800-FDA-1088. Where should I keep my medication? This medication is given in a hospital or clinic. It will not be stored at home. NOTE: This sheet is a summary. It may not cover all possible information. If you have questions about this medicine, talk to your doctor, pharmacist, or health care provider.  2023 Elsevier/Gold Standard (2022-05-07 00:00:00)        To help prevent nausea and vomiting after your treatment,  we encourage you to take your nausea medication as directed.  BELOW ARE SYMPTOMS THAT SHOULD BE REPORTED IMMEDIATELY: *FEVER GREATER THAN 100.4 F (38 C) OR HIGHER *CHILLS OR SWEATING *NAUSEA AND VOMITING THAT IS NOT CONTROLLED WITH YOUR NAUSEA MEDICATION *UNUSUAL SHORTNESS OF BREATH *UNUSUAL BRUISING OR BLEEDING *URINARY  PROBLEMS (pain or burning when urinating, or frequent urination) *BOWEL PROBLEMS (unusual diarrhea, constipation, pain near the anus) TENDERNESS IN MOUTH AND THROAT WITH OR WITHOUT PRESENCE OF ULCERS (sore throat, sores in mouth, or a toothache) UNUSUAL RASH, SWELLING OR PAIN  UNUSUAL VAGINAL DISCHARGE OR ITCHING   Items with * indicate a potential emergency and should be followed up as soon as possible or go to the Emergency Department if any problems should occur.  Please show the CHEMOTHERAPY ALERT CARD or IMMUNOTHERAPY ALERT CARD at check-in to the Emergency Department and triage nurse.  Should you have questions after your visit or need to cancel or reschedule your appointment, please contact [facility] [phone]  and follow the prompts.  Office hours are 8:00 a.m. to 4:30 p.m. Monday - Friday. Please note that voicemails left after 4:00 p.m. may not be returned until the following business day.  We are closed weekends and major holidays. You have access to a nurse at all times for urgent questions. Please call the main number to the clinic [phone] and follow the prompts.  For any non-urgent questions, you may also contact your provider using MyChart. We now offer e-Visits for anyone 37 and older to request care online for non-urgent symptoms. For details visit [email or web address].   Also download the MyChart app! Go to the app store, search "MyChart", open the app, select [facility], and log in with your MyChart username and password.  Masks are optional in the cancer centers. If you would like for your care team to wear a mask while they are taking care of you, please let them know. You may have one support person who is at least 72 years old accompany you for your appointments.

## 2022-11-11 ENCOUNTER — Inpatient Hospital Stay: Payer: Medicare HMO

## 2022-11-11 VITALS — BP 134/79 | HR 58 | Temp 97.8°F | Resp 18

## 2022-11-11 DIAGNOSIS — C7951 Secondary malignant neoplasm of bone: Secondary | ICD-10-CM | POA: Diagnosis not present

## 2022-11-11 DIAGNOSIS — C9000 Multiple myeloma not having achieved remission: Secondary | ICD-10-CM

## 2022-11-11 DIAGNOSIS — Z79899 Other long term (current) drug therapy: Secondary | ICD-10-CM | POA: Diagnosis not present

## 2022-11-11 DIAGNOSIS — Z5111 Encounter for antineoplastic chemotherapy: Secondary | ICD-10-CM | POA: Diagnosis not present

## 2022-11-11 DIAGNOSIS — C61 Malignant neoplasm of prostate: Secondary | ICD-10-CM | POA: Diagnosis not present

## 2022-11-11 DIAGNOSIS — C9002 Multiple myeloma in relapse: Secondary | ICD-10-CM | POA: Diagnosis not present

## 2022-11-11 DIAGNOSIS — Z5112 Encounter for antineoplastic immunotherapy: Secondary | ICD-10-CM | POA: Diagnosis not present

## 2022-11-11 LAB — CBC WITH DIFFERENTIAL/PLATELET
Abs Immature Granulocytes: 0.01 10*3/uL (ref 0.00–0.07)
Basophils Absolute: 0 10*3/uL (ref 0.0–0.1)
Basophils Relative: 1 %
Eosinophils Absolute: 0.2 10*3/uL (ref 0.0–0.5)
Eosinophils Relative: 7 %
HCT: 30.4 % — ABNORMAL LOW (ref 39.0–52.0)
Hemoglobin: 10.3 g/dL — ABNORMAL LOW (ref 13.0–17.0)
Immature Granulocytes: 0 %
Lymphocytes Relative: 23 %
Lymphs Abs: 0.7 10*3/uL (ref 0.7–4.0)
MCH: 32.5 pg (ref 26.0–34.0)
MCHC: 33.9 g/dL (ref 30.0–36.0)
MCV: 95.9 fL (ref 80.0–100.0)
Monocytes Absolute: 0.4 10*3/uL (ref 0.1–1.0)
Monocytes Relative: 11 %
Neutro Abs: 1.9 10*3/uL (ref 1.7–7.7)
Neutrophils Relative %: 58 %
Platelets: 151 10*3/uL (ref 150–400)
RBC: 3.17 MIL/uL — ABNORMAL LOW (ref 4.22–5.81)
RDW: 15.9 % — ABNORMAL HIGH (ref 11.5–15.5)
WBC: 3.2 10*3/uL — ABNORMAL LOW (ref 4.0–10.5)
nRBC: 0 % (ref 0.0–0.2)

## 2022-11-11 LAB — COMPREHENSIVE METABOLIC PANEL
ALT: 26 U/L (ref 0–44)
AST: 18 U/L (ref 15–41)
Albumin: 3.5 g/dL (ref 3.5–5.0)
Alkaline Phosphatase: 52 U/L (ref 38–126)
Anion gap: 4 — ABNORMAL LOW (ref 5–15)
BUN: 23 mg/dL (ref 8–23)
CO2: 24 mmol/L (ref 22–32)
Calcium: 8 mg/dL — ABNORMAL LOW (ref 8.9–10.3)
Chloride: 112 mmol/L — ABNORMAL HIGH (ref 98–111)
Creatinine, Ser: 1.12 mg/dL (ref 0.61–1.24)
GFR, Estimated: >60 mL/min (ref >60)
Glucose, Bld: 90 mg/dL (ref 70–99)
Potassium: 3.6 mmol/L (ref 3.5–5.1)
Sodium: 140 mmol/L (ref 135–145)
Total Bilirubin: 0.7 mg/dL (ref 0.3–1.2)
Total Protein: 5.6 g/dL — ABNORMAL LOW (ref 6.5–8.1)

## 2022-11-11 LAB — MAGNESIUM: Magnesium: 1.9 mg/dL (ref 1.7–2.4)

## 2022-11-11 MED ORDER — SODIUM CHLORIDE 0.9% FLUSH
10.0000 mL | INTRAVENOUS | Status: DC | PRN
  Administered 2022-11-11: 10 mL

## 2022-11-11 MED ORDER — DEXTROSE 5 % IV SOLN
56.0000 mg/m2 | Freq: Once | INTRAVENOUS | Status: AC
  Administered 2022-11-11: 110 mg via INTRAVENOUS
  Filled 2022-11-11: qty 10

## 2022-11-11 MED ORDER — HEPARIN SOD (PORK) LOCK FLUSH 100 UNIT/ML IV SOLN
500.0000 [IU] | Freq: Once | INTRAVENOUS | Status: AC | PRN
  Administered 2022-11-11: 500 [IU]

## 2022-11-11 MED ORDER — SODIUM CHLORIDE 0.9 % IV SOLN: Freq: Once | INTRAVENOUS | Status: AC

## 2022-11-11 MED ORDER — SODIUM CHLORIDE 0.9 % IV SOLN
20.0000 mg | Freq: Once | INTRAVENOUS | Status: AC
  Administered 2022-11-11: 20 mg via INTRAVENOUS
  Filled 2022-11-11: qty 2

## 2022-11-11 NOTE — Patient Instructions (Signed)
[place]  Discharge Instructions: Thank you for choosing [facility] to provide your oncology and hematology care.  If you have a lab appointment with the [facility], please come in thru the Main Entrance and check in at the main information desk.  Wear comfortable clothing and clothing appropriate for easy access to any Portacath or PICC line.   We strive to give you quality time with your provider. You may need to reschedule your appointment if you arrive late (15 or more minutes).  Arriving late affects you and other patients whose appointments are after yours.  Also, if you miss three or more appointments without notifying the office, you may be dismissed from the clinic at the provider's discretion.      For prescription refill requests, have your pharmacy contact our office and allow 72 hours for refills to be completed.    Today you received the following chemotherapy and/or immunotherapy agents Kyprolis.  Carfilzomib Injection What is this medication? CARFILZOMIB (kar FILZ oh mib) treats multiple myeloma, a type of bone marrow cancer. It works by blocking a protein that causes cancer cells to grow and multiply. This helps to slow or stop the spread of cancer cells. This medicine may be used for other purposes; ask your health care provider or pharmacist if you have questions. COMMON BRAND NAME(S): KYPROLIS What should I tell my care team before I take this medication? They need to know if you have any of these conditions: Heart disease History of blood clots Irregular heartbeat Kidney disease Liver disease Lung or breathing disease An unusual or allergic reaction to carfilzomib, or other medications, foods, dyes, or preservatives If you or your partner are pregnant or trying to get pregnant Breastfeeding How should I use this medication? This medication is injected into a vein. It is given by your care team in a hospital or clinic  setting. Talk to your care team about the use of this medication in children. Special care may be needed. Overdosage: If you think you have taken too much of this medicine contact a poison control center or emergency room at once. NOTE: This medicine is only for you. Do not share this medicine with others. What if I miss a dose? Keep appointments for follow-up doses. It is important not to miss your dose. Call your care team if you are unable to keep an appointment. What may interact with this medication? Interactions are not expected. This list may not describe all possible interactions. Give your health care provider a list of all the medicines, herbs, non-prescription drugs, or dietary supplements you use. Also tell them if you smoke, drink alcohol, or use illegal drugs. Some items may interact with your medicine. What should I watch for while using this medication? Your condition will be monitored carefully while you are receiving this medication. You may need blood work while taking this medication. Check with your care team if you have severe diarrhea, nausea, and vomiting, or if you sweat a lot. The loss of too much body fluid may make it dangerous for you to take this medication. This medication may affect your coordination, reaction time, or judgment. Do not drive or operate machinery until you know how this medication affects you. Sit up or stand slowly to reduce the risk of dizzy or fainting spells. Drinking alcohol with this medication can increase the risk of these side effects. Talk to your care team if you may be pregnant. Serious birth defects can  occur if you take this medication during pregnancy and for 6 months after the last dose. You will need a negative pregnancy test before starting this medication. Contraception is recommended while taking this medication and for 6 months after the last dose. Your care team can help you find an option that works for you. If your partner can get  pregnant, use a condom during sex while taking this medication and for 3 months after the last dose. Do not breastfeed while taking this medication and for 2 weeks after the last dose. This medication may cause infertility. Talk to your care team if you are concerned about your fertility. What side effects may I notice from receiving this medication? Side effects that you should report to your care team as soon as possible: Allergic reactions--skin rash, itching, hives, swelling of the face, lips, tongue, or throat Bleeding--bloody or black, tar-like stools, vomiting blood or Ameen Mostafa material that looks like coffee grounds, red or dark Loa Idler urine, small red or purple spots on skin, unusual bruising or bleeding Blood clot--pain, swelling, or warmth in the leg, shortness of breath, chest pain Dizziness, loss of balance or coordination, confusion or trouble speaking Heart attack--pain or tightness in the chest, shoulders, arms, or jaw, nausea, shortness of breath, cold or clammy skin, feeling faint or lightheaded Heart failure--shortness of breath, swelling of the ankles, feet, or hands, sudden weight gain, unusual weakness or fatigue Heart rhythm changes--fast or irregular heartbeat, dizziness, feeling faint or lightheaded, chest pain, trouble breathing Increase in blood pressure Infection--fever, chills, cough, sore throat, wounds that don't heal, pain or trouble when passing urine, general feeling of discomfort or being unwell Infusion reactions--chest pain, shortness of breath or trouble breathing, feeling faint or lightheaded Kidney injury--decrease in the amount of urine, swelling of the ankles, hands, or feet Liver injury--right upper belly pain, loss of appetite, nausea, light-colored stool, dark yellow or Bobbyjo Marulanda urine, yellowing skin or eyes, unusual weakness or fatigue Lung injury--shortness of breath or trouble breathing, cough, spitting up blood, chest pain, fever Pulmonary  hypertension--shortness of breath, chest pain, fast or irregular heartbeat, feeling faint or lightheaded, fatigue, swelling of the ankles or feet Stomach pain, bloody diarrhea, pale skin, unusual weakness or fatigue, decrease in the amount of urine, which may be signs of hemolytic uremic syndrome Sudden and severe headache, confusion, change in vision, seizures, which may be signs of posterior reversible encephalopathy syndrome (PRES) TTP--purple spots on the skin or inside the mouth, pale skin, yellowing skin or eyes, unusual weakness or fatigue, fever, fast or irregular heartbeat, confusion, change in vision, trouble speaking, trouble walking Tumor lysis syndrome (TLS)--nausea, vomiting, diarrhea, decrease in the amount of urine, dark urine, unusual weakness or fatigue, confusion, muscle pain or cramps, fast or irregular heartbeat, joint pain Side effects that usually do not require medical attention (report to your care team if they continue or are bothersome): Diarrhea Fatigue Nausea Trouble sleeping This list may not describe all possible side effects. Call your doctor for medical advice about side effects. You may report side effects to FDA at 1-800-FDA-1088. Where should I keep my medication? This medication is given in a hospital or clinic. It will not be stored at home. NOTE: This sheet is a summary. It may not cover all possible information. If you have questions about this medicine, talk to your doctor, pharmacist, or health care provider.  2023 Elsevier/Gold Standard (2022-05-07 00:00:00)        To help prevent nausea and vomiting after your treatment,  we encourage you to take your nausea medication as directed.  BELOW ARE SYMPTOMS THAT SHOULD BE REPORTED IMMEDIATELY: *FEVER GREATER THAN 100.4 F (38 C) OR HIGHER *CHILLS OR SWEATING *NAUSEA AND VOMITING THAT IS NOT CONTROLLED WITH YOUR NAUSEA MEDICATION *UNUSUAL SHORTNESS OF BREATH *UNUSUAL BRUISING OR BLEEDING *URINARY  PROBLEMS (pain or burning when urinating, or frequent urination) *BOWEL PROBLEMS (unusual diarrhea, constipation, pain near the anus) TENDERNESS IN MOUTH AND THROAT WITH OR WITHOUT PRESENCE OF ULCERS (sore throat, sores in mouth, or a toothache) UNUSUAL RASH, SWELLING OR PAIN  UNUSUAL VAGINAL DISCHARGE OR ITCHING   Items with * indicate a potential emergency and should be followed up as soon as possible or go to the Emergency Department if any problems should occur.  Please show the CHEMOTHERAPY ALERT CARD or IMMUNOTHERAPY ALERT CARD at check-in to the Emergency Department and triage nurse.  Should you have questions after your visit or need to cancel or reschedule your appointment, please contact [facility] [phone]  and follow the prompts.  Office hours are 8:00 a.m. to 4:30 p.m. Monday - Friday. Please note that voicemails left after 4:00 p.m. may not be returned until the following business day.  We are closed weekends and major holidays. You have access to a nurse at all times for urgent questions. Please call the main number to the clinic [phone] and follow the prompts.  For any non-urgent questions, you may also contact your provider using MyChart. We now offer e-Visits for anyone 67 and older to request care online for non-urgent symptoms. For details visit [email or web address].   Also download the MyChart app! Go to the app store, search "MyChart", open the app, select [facility], and log in with your MyChart username and password.  Masks are optional in the cancer centers. If you would like for your care team to wear a mask while they are taking care of you, please let them know. You may have one support person who is at least 72 years old accompany you for your appointments.

## 2022-11-11 NOTE — Progress Notes (Signed)
Patient presents today for Kyprolis infusion.  Patient is in satisfactory condition with no new complaints voiced.  Vital signs are stable.  Labs reviewed.  All labs are within treatment parameters.  We will proceed with treatment per MD orders.   Patient tolerated treatment well with no complaints voiced.  Patient left ambulatory in stable condition.  Vital signs stable at discharge.  Follow up as scheduled.

## 2022-11-14 DIAGNOSIS — Z01818 Encounter for other preprocedural examination: Secondary | ICD-10-CM | POA: Diagnosis not present

## 2022-11-14 DIAGNOSIS — Z79899 Other long term (current) drug therapy: Secondary | ICD-10-CM | POA: Diagnosis not present

## 2022-11-14 DIAGNOSIS — C9000 Multiple myeloma not having achieved remission: Secondary | ICD-10-CM | POA: Diagnosis not present

## 2022-11-14 DIAGNOSIS — Z01810 Encounter for preprocedural cardiovascular examination: Secondary | ICD-10-CM | POA: Diagnosis not present

## 2022-11-14 DIAGNOSIS — Z125 Encounter for screening for malignant neoplasm of prostate: Secondary | ICD-10-CM | POA: Diagnosis not present

## 2022-11-14 DIAGNOSIS — Z1159 Encounter for screening for other viral diseases: Secondary | ICD-10-CM | POA: Diagnosis not present

## 2022-11-14 DIAGNOSIS — Z01812 Encounter for preprocedural laboratory examination: Secondary | ICD-10-CM | POA: Diagnosis not present

## 2022-11-14 DIAGNOSIS — I081 Rheumatic disorders of both mitral and tricuspid valves: Secondary | ICD-10-CM | POA: Diagnosis not present

## 2022-11-16 DIAGNOSIS — I4510 Unspecified right bundle-branch block: Secondary | ICD-10-CM | POA: Diagnosis not present

## 2022-11-16 DIAGNOSIS — C9000 Multiple myeloma not having achieved remission: Secondary | ICD-10-CM | POA: Diagnosis not present

## 2022-11-17 ENCOUNTER — Other Ambulatory Visit: Payer: Self-pay | Admitting: *Deleted

## 2022-11-17 DIAGNOSIS — C9000 Multiple myeloma not having achieved remission: Secondary | ICD-10-CM | POA: Diagnosis not present

## 2022-11-17 DIAGNOSIS — Z01818 Encounter for other preprocedural examination: Secondary | ICD-10-CM | POA: Diagnosis not present

## 2022-11-18 ENCOUNTER — Inpatient Hospital Stay: Payer: Medicare HMO

## 2022-11-18 VITALS — BP 134/77 | HR 59 | Temp 98.3°F | Resp 16

## 2022-11-18 DIAGNOSIS — Z95828 Presence of other vascular implants and grafts: Secondary | ICD-10-CM

## 2022-11-18 DIAGNOSIS — C9002 Multiple myeloma in relapse: Secondary | ICD-10-CM | POA: Diagnosis not present

## 2022-11-18 DIAGNOSIS — C9000 Multiple myeloma not having achieved remission: Secondary | ICD-10-CM

## 2022-11-18 DIAGNOSIS — Z5112 Encounter for antineoplastic immunotherapy: Secondary | ICD-10-CM | POA: Diagnosis not present

## 2022-11-18 DIAGNOSIS — Z5111 Encounter for antineoplastic chemotherapy: Secondary | ICD-10-CM | POA: Diagnosis not present

## 2022-11-18 DIAGNOSIS — C61 Malignant neoplasm of prostate: Secondary | ICD-10-CM | POA: Diagnosis not present

## 2022-11-18 DIAGNOSIS — C7951 Secondary malignant neoplasm of bone: Secondary | ICD-10-CM | POA: Diagnosis not present

## 2022-11-18 DIAGNOSIS — Z79899 Other long term (current) drug therapy: Secondary | ICD-10-CM | POA: Diagnosis not present

## 2022-11-18 LAB — COMPREHENSIVE METABOLIC PANEL
ALT: 22 U/L (ref 0–44)
AST: 18 U/L (ref 15–41)
Albumin: 3.7 g/dL (ref 3.5–5.0)
Alkaline Phosphatase: 43 U/L (ref 38–126)
Anion gap: 4 — ABNORMAL LOW (ref 5–15)
BUN: 23 mg/dL (ref 8–23)
CO2: 25 mmol/L (ref 22–32)
Calcium: 8.1 mg/dL — ABNORMAL LOW (ref 8.9–10.3)
Chloride: 111 mmol/L (ref 98–111)
Creatinine, Ser: 1.1 mg/dL (ref 0.61–1.24)
GFR, Estimated: >60 mL/min (ref >60)
Glucose, Bld: 99 mg/dL (ref 70–99)
Potassium: 4 mmol/L (ref 3.5–5.1)
Sodium: 140 mmol/L (ref 135–145)
Total Bilirubin: 0.6 mg/dL (ref 0.3–1.2)
Total Protein: 5.5 g/dL — ABNORMAL LOW (ref 6.5–8.1)

## 2022-11-18 LAB — CBC WITH DIFFERENTIAL/PLATELET
Abs Immature Granulocytes: 0 10*3/uL (ref 0.00–0.07)
Basophils Absolute: 0.1 10*3/uL (ref 0.0–0.1)
Basophils Relative: 2 %
Eosinophils Absolute: 0.1 10*3/uL (ref 0.0–0.5)
Eosinophils Relative: 3 %
HCT: 30 % — ABNORMAL LOW (ref 39.0–52.0)
Hemoglobin: 10.1 g/dL — ABNORMAL LOW (ref 13.0–17.0)
Immature Granulocytes: 0 %
Lymphocytes Relative: 23 %
Lymphs Abs: 0.7 10*3/uL (ref 0.7–4.0)
MCH: 32.5 pg (ref 26.0–34.0)
MCHC: 33.7 g/dL (ref 30.0–36.0)
MCV: 96.5 fL (ref 80.0–100.0)
Monocytes Absolute: 0.4 10*3/uL (ref 0.1–1.0)
Monocytes Relative: 14 %
Neutro Abs: 1.7 10*3/uL (ref 1.7–7.7)
Neutrophils Relative %: 58 %
Platelets: 142 10*3/uL — ABNORMAL LOW (ref 150–400)
RBC: 3.11 MIL/uL — ABNORMAL LOW (ref 4.22–5.81)
RDW: 16.2 % — ABNORMAL HIGH (ref 11.5–15.5)
WBC: 2.9 10*3/uL — ABNORMAL LOW (ref 4.0–10.5)
nRBC: 0 % (ref 0.0–0.2)

## 2022-11-18 LAB — MAGNESIUM: Magnesium: 2 mg/dL (ref 1.7–2.4)

## 2022-11-18 LAB — PSA: Prostatic Specific Antigen: 1.42 ng/mL (ref 0.00–4.00)

## 2022-11-18 MED ORDER — HEPARIN SOD (PORK) LOCK FLUSH 100 UNIT/ML IV SOLN
500.0000 [IU] | Freq: Once | INTRAVENOUS | Status: AC
  Administered 2022-11-18: 500 [IU] via INTRAVENOUS

## 2022-11-18 MED ORDER — SODIUM CHLORIDE 0.9% FLUSH
10.0000 mL | INTRAVENOUS | Status: DC | PRN
  Administered 2022-11-18: 10 mL via INTRAVENOUS

## 2022-11-18 NOTE — Progress Notes (Signed)
Patients port flushed without difficulty.  Good blood return noted with no bruising or swelling noted at site.  Band aid applied.  VSS with discharge and left in satisfactory condition with no s/s of distress noted.   

## 2022-11-19 LAB — KAPPA/LAMBDA LIGHT CHAINS
Kappa free light chain: 3 mg/L — ABNORMAL LOW (ref 3.3–19.4)
Kappa, lambda light chain ratio: 1.67 — ABNORMAL HIGH (ref 0.26–1.65)
Lambda free light chains: 1.8 mg/L — ABNORMAL LOW (ref 5.7–26.3)

## 2022-11-20 ENCOUNTER — Encounter: Payer: Self-pay | Admitting: Nurse Practitioner

## 2022-11-20 ENCOUNTER — Ambulatory Visit: Payer: Medicare HMO | Attending: Nurse Practitioner | Admitting: Nurse Practitioner

## 2022-11-20 ENCOUNTER — Encounter: Payer: Self-pay | Admitting: *Deleted

## 2022-11-20 ENCOUNTER — Other Ambulatory Visit: Payer: Self-pay

## 2022-11-20 VITALS — BP 120/64 | HR 64 | Ht 74.0 in | Wt 192.4 lb

## 2022-11-20 DIAGNOSIS — E785 Hyperlipidemia, unspecified: Secondary | ICD-10-CM

## 2022-11-20 DIAGNOSIS — Z8546 Personal history of malignant neoplasm of prostate: Secondary | ICD-10-CM

## 2022-11-20 DIAGNOSIS — I209 Angina pectoris, unspecified: Secondary | ICD-10-CM

## 2022-11-20 DIAGNOSIS — R0789 Other chest pain: Secondary | ICD-10-CM

## 2022-11-20 DIAGNOSIS — Z77090 Contact with and (suspected) exposure to asbestos: Secondary | ICD-10-CM | POA: Diagnosis not present

## 2022-11-20 DIAGNOSIS — C9000 Multiple myeloma not having achieved remission: Secondary | ICD-10-CM

## 2022-11-20 LAB — PROTEIN ELECTROPHORESIS, SERUM
A/G Ratio: 1.9 — ABNORMAL HIGH (ref 0.7–1.7)
Albumin ELP: 3.5 g/dL (ref 2.9–4.4)
Alpha-1-Globulin: 0.2 g/dL (ref 0.0–0.4)
Alpha-2-Globulin: 0.5 g/dL (ref 0.4–1.0)
Beta Globulin: 0.8 g/dL (ref 0.7–1.3)
Gamma Globulin: 0.2 g/dL — ABNORMAL LOW (ref 0.4–1.8)
Globulin, Total: 1.8 g/dL — ABNORMAL LOW (ref 2.2–3.9)
M-Spike, %: 0.1 g/dL — ABNORMAL HIGH
Total Protein ELP: 5.3 g/dL — ABNORMAL LOW (ref 6.0–8.5)

## 2022-11-20 MED ORDER — NITROGLYCERIN 0.4 MG SL SUBL: 0.4000 mg | SUBLINGUAL_TABLET | SUBLINGUAL | 0 refills | Status: DC | PRN

## 2022-11-20 NOTE — Progress Notes (Signed)
Cardiology Office Note:    Date:  11/20/2022  ID:  Luis Stewart, DOB [birth year], MRN [id number]  PCP:  Lindell Spar, MD   [facility] Cardiologist:  Rozann Lesches, MD     Referring MD: Lindell Spar, MD   CC: Here for 1 year follow-up  History of Present Illness:    Luis Stewart is a 71 y.o. male with a hx of the following:  Atypical CP Abnormal EKG Coronary Calcification on CT scan in 2016 HLD History of PE History of GI bleed while on Xarelto History of asbestos exposure (follows Dr. Melvyn Novas) Multiple myeloma, following Oncology (Dx 02/2022) GERD Hx of prostate cancer  Initially seen by Dr. Domenic Polite in 2016 with chief complaint of A-fib.  Was seen a few days prior to this office visit in the ER for chest discomfort, EKG revealed possible ectopic atrial bradycardia with incomplete RBBB.  CT scan of chest in ED also demonstrated coronary atherosclerosis, particularly in LAD distribution.  Echo arranged by Dr. Domenic Polite was overall reassuring, normal LVEF without major valvular abnormalities.  Stress test was very reassuring, normal study.  Coronary CTA arranged d/t recurrent chest pain in 2019 calcium score in 2019 was 0, no significant disease was noted in the coronary arteries.   Last seen by [name], NP on May 28, 2021.  Was overall doing well from a cardiac perspective.  Denied any chest pain or any breathing issues.  No lab work or testing was ordered.  Was told to follow-up in 1 year.  Today he presents for 1 year follow-up.  He states he is overall doing very well from a cardiac perspective and denies any cardiac complaints or issues.  Was diagnosed with multiply [facility] in March 2023.  He is following Dr. Delton Coombes with [facility] as well as [facility] Feliciana Rossetti at [facility].  Just had very thorough workup done, including labs recently, in regards to possible future stem cell transplant. Underwent  Echocardiogram performed with [facility] on 11/14/22 for pretransplant evaluation revealed LVEF 55 to 60%, no RWMA, mild MR, mild TR, all other findings normal.  Tells me he was diagnosed with prostate cancer in 2016, does state his PSA levels are improving, follows up with urologist regularly.  Denies any chest pain, shortness of breath, palpitations, syncope, presyncope, dizziness, orthopnea, PND, swelling, significant weight changes, acute bleeding, or claudication.  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 Rourk:internal hemorrhoids/tortus colon/ascending colon polyps, tubular adenomas   COLONOSCOPY N/A 09/04/2014   Procedure: COLONOSCOPY;  Surgeon: Daneil Dolin, MD;  [facility];  Service: Endoscopy;  Laterality: N/A;  9:30   COLONOSCOPY N/A 07/17/2017   Procedure: COLONOSCOPY;  Surgeon: Danie Binder, MD;  [facility];  Service: Endoscopy;  Laterality: N/A;  ESOPHAGOGASTRODUODENOSCOPY N/A 07/16/2017   Procedure: ESOPHAGOGASTRODUODENOSCOPY (EGD);  Surgeon: Danie Binder, MD;  [facility];  Service: Endoscopy;  Laterality: N/A;   GIVENS CAPSULE STUDY  07/16/2017   Procedure: GIVENS CAPSULE STUDY;  Surgeon: Danie Binder, MD;  [facility];  Service: Endoscopy;;   JOINT REPLACEMENT N/A    Phreesia 01/12/2021   POLYPECTOMY  07/17/2017   Procedure: POLYPECTOMY;  Surgeon: Danie Binder, MD;   [facility];  Service: Endoscopy;;  cecal   PORTACATH PLACEMENT Left 04/18/2022   Procedure: INSERTION PORT-A-CATH;  Surgeon: Aviva Signs, MD;  Location: [facility];  Service: General;  Laterality: Left;   TOTAL KNEE ARTHROPLASTY Right 06/10/2017   TOTAL KNEE ARTHROPLASTY Right 06/10/2017   Procedure: TOTAL KNEE ARTHROPLASTY;  Surgeon: [name], MD;  Location: [facility];  Service: Orthopedics;  Laterality: Right;    Current Medications: Current Meds  Medication Sig   acetaminophen (TYLENOL) 500 MG tablet Take 1,000 mg by mouth every 6 (six) hours as needed for mild pain or moderate pain.   acyclovir (ZOVIRAX) 400 MG tablet TAKE 1 TABLET BY MOUTH TWICE A DAY   aspirin EC 81 MG tablet Take 81 mg by mouth daily. Swallow whole.   benzonatate (TESSALON) 100 MG capsule Take 1 capsule (100 mg total) by mouth 2 (two) times daily as needed for cough.   Carfilzomib (KYPROLIS IV) Inject into the vein once a week.   Cholecalciferol (VITAMIN D) 2000 units CAPS Take 2,000 Units by mouth every other day.   daratumumab-hyaluronidase-fihj (DARZALEX FASPRO) 1800-30000 MG-UT/15ML SOLN Inject 1,800 mg into the skin once a week.   lenalidomide (REVLIMID) 15 MG capsule Take 1 capsule (15 mg total) by mouth daily. Take for 21 days, then hold for 7 days. Repeat every 28 days.   lidocaine-prilocaine (EMLA) cream Apply a small amount to port a cath site and cover with plastic wrap (do not rub in) 1 hour prior to infusion appointments   methocarbamol (ROBAXIN) 500 MG tablet Take 1 tablet (500 mg total) by mouth 3 (three) times daily.   Multiple Vitamin (MULTIVITAMIN WITH MINERALS) TABS tablet Take 1 tablet by mouth daily with breakfast. Centrum Silver for Men   nirmatrelvir & ritonavir (PAXLOVID, 300/100,) 20 x 150 MG & 10 x 100MG TBPK Take pack as directed.  Do not take Revlamid while on therapy and resume day after last dose.   pantoprazole (PROTONIX) 40 MG tablet TAKE 1 TABLET BY MOUTH EVERY OTHER DAY    prochlorperazine (COMPAZINE) 10 MG tablet Take 1 tablet (10 mg total) by mouth every 6 (six) hours as needed for nausea or vomiting.   tamsulosin (FLOMAX) 0.4 MG CAPS capsule Take 1 capsule (0.4 mg total) by mouth daily.   traMADol (ULTRAM) 50 MG tablet Take 1 tablet (50 mg total) by mouth every 6 (six) hours as needed.   nitroGLYCERIN (NITROSTAT) 0.4 MG SL tablet Place 1 tablet (0.4 mg total) under the tongue every 5 (five) minutes as needed for chest pain.     Allergies:   Sulfa antibiotics   Social History   Socioeconomic History   Marital status: Married    Spouse name: Not on file   Number of children: Not on file   Years of education: Not on file   Highest education level: Not on file  Occupational History   Occupation: Retired  Tobacco Use   Smoking status: Never   Smokeless tobacco: Never  Vaping Use   Vaping Use:  Never used  Substance and Sexual Activity   Alcohol use: No   Drug use: No   Sexual activity: Yes  Other Topics Concern   Not on file  Social History Narrative   Not on file   Social Determinants of Health   Financial Resource Strain: Not on file  Food Insecurity: Not on file  Transportation Needs: No Transportation Needs (02/03/2022)   PRAPARE - Hydrologist (Medical): No    Lack of Transportation (Non-Medical): No  Physical Activity: Sufficiently Active (02/03/2022)   Exercise Vital Sign    Days of Exercise per Week: 4 days    Minutes of Exercise per Session: 60 min  Stress: Not on file  Social Connections: Moderately Integrated (02/03/2022)   Social Connection and Isolation Panel [NHANES]    Frequency of Communication with Friends and Family: More than three times a week    Frequency of Social Gatherings with Friends and Family: More than three times a week    Attends Religious Services: More than 4 times per year    Active Member of [employer] or Organizations: No    Attends Music therapist: Never    Marital  Status: Married     Family History: The patient's family history includes Arthritis in his sister; Deep vein thrombosis in his father; Dementia in his father; Diabetes in his mother; Heart disease in his father and mother; Hypertension in his mother and sister; Mental illness in his mother; Obesity in his mother and sister. There is no history of Colon cancer.  ROS:   Review of Systems  Constitutional: Negative.   HENT: Negative.    Eyes: Negative.   Respiratory: Negative.    Cardiovascular: Negative.   Gastrointestinal: Negative.   Genitourinary: Negative.   Musculoskeletal: Negative.   Skin: Negative.   Neurological: Negative.   Endo/Heme/Allergies: Negative.   Psychiatric/Behavioral: Negative.      Please see the history of present illness.    All other systems reviewed and are negative.  EKGs/Labs/Other Studies Reviewed:    The following studies were reviewed today:   EKG:  EKG is not ordered today.   2D Echocardiogram on November 14, 2022 ([facility]):   LVEF 55 to 60%, no RWMA, mild MR, mild TR, all other findings normal.  Coronary CTA on October 29, 2018: 1.  Coronary artery calcium score 0. 2.  No significant disease noted in coronary arteries. 3.  No acute or clinically significant findings in the extracardiac anatomy.   Myoview on November 04, 2017: There was no ST segment deviation noted during stress. The study is normal. No myocardial ischemia or scar. This is a low risk study. Nuclear stress EF: 67%.  Vascular ultrasound lower extremity Doppler on July 17, 2017: No evidence acute or chronic DVT within either lower extremity with special attention paid to the right posterior tibial and peroneal veins.  Vascular ultrasound lower extremity Doppler on June 18, 2017: Occlusive thrombus within the right calf posterior tibial and peroneal veins.  Myoview on October 05, 2015: No diagnostic ST segment changes by standard criteria. Adequate heart rate response  noted with sinus rhythm throughout. Ectopic atrial rhythm noted at the end of recovery. Blood pressure demonstrated a hypertensive response to exercise. Perfusion imaging is most consistent with attenuation artifact in the inferolateral wall on rest imaging. No clear evidence of scar or ischemia. This is a low risk study. Nuclear stress EF: 67%.  Recent Labs: 02/06/2022: TSH 4.070 11/18/2022: ALT 22; BUN  23; Creatinine, Ser 1.10; Hemoglobin 10.1; Magnesium 2.0; Platelets 142; Potassium 4.0; Sodium 140  Recent Lipid Panel    Component Value Date/Time   CHOL 137 02/06/2022 0940   TRIG 55 02/06/2022 0940   HDL 39 (L) 02/06/2022 0940   CHOLHDL 3.5 02/06/2022 0940   CHOLHDL 3.1 01/11/2021 0852   VLDL 14 10/15/2016 0906   LDLCALC 86 02/06/2022 0940   [place] 95 01/11/2021 0852     Risk Assessment/Calculations:    The 10-year ASCVD risk score (Arnett DK, et al., 2019) is: 10.9%   Values used to calculate the score:     Age: 64 years     Sex: Male     Is Non-Hispanic African American: Yes     Diabetic: No     Tobacco smoker: No     Systolic Blood Pressure: 962 mmHg     Is BP treated: No     HDL Cholesterol: 39 mg/dL     Total Cholesterol: 137 mg/dL  Physical Exam:    VS:  BP 120/64   Pulse 64   Ht _0  (1.88 m)   Wt 192 lb 6.4 oz (87.3 kg)   SpO2 95%   BMI 24.70 kg/m     Wt Readings from Last 3 Encounters:  11/20/22 192 lb 6.4 oz (87.3 kg)  11/11/22 190 lb 8 oz (86.4 kg)  11/04/22 191 lb 3.2 oz (86.7 kg)     GEN: Well nourished, well developed in no acute distress HEENT: Normal NECK: No JVD; No carotid bruits CARDIAC: S1/S2, RRR, no murmurs, rubs, gallops; 2+ peripheral pulses throughout, strong and equal bilaterally RESPIRATORY:  Clear and diminished to auscultation without rales, wheezing or rhonchi  MUSCULOSKELETAL:  No edema; No deformity  SKIN: Warm and dry NEUROLOGIC:  Alert and oriented x 3 PSYCHIATRIC:  Normal affect   ASSESSMENT:    1. Atypical chest  pain   2. Hyperlipidemia, unspecified hyperlipidemia type   3. Asbestos exposure   4. Multiple myeloma not having achieved remission ([place])   5. History of prostate cancer    PLAN:    In order of problems listed above:  History of atypical chest pain Coronary CTA in 2019 revealed coronary artery calcium score of 0.  Stable with no anginal symptoms. No indication for ischemic evaluation.  Continue current medication regimen.  Will refill nitroglycerin as he is due for this to be refilled. Heart healthy diet and regular cardiovascular exercise encouraged.   Hyperlipidemia Labs with PCP from February 2023 revealed total cholesterol 137, HDL 39, LDL 86, and triglycerides 55.  This is being managed by PCP. Heart healthy diet and regular cardiovascular exercise encouraged.   Asbestos exposure Past history of exposure to asbestos.  Continue to follow-up with pulmonology and PCP.  4. Multiple myeloma and history of prostate cancer Continue to follow-up with oncology and urology.     5.  Disposition: Recommended that if he does undergo SCT, to have requesting party's office to fax over pre-op clearance forms if needed. Follow-up with Dr. Domenic Polite in 1 year or sooner if anything changes.  Medication Adjustments/Labs and Tests Ordered: Current medicines are reviewed at length with the patient today.  Concerns regarding medicines are outlined above.  No orders of the defined types were placed in this encounter.  Meds ordered this encounter  Medications   nitroGLYCERIN (NITROSTAT) 0.4 MG SL tablet    Sig: Place 1 tablet (0.4 mg total) under the tongue every 5 (five) minutes x 3 doses as  needed for chest pain (If no relief after 3rd dose, call or go to ED.).    Dispense:  30 tablet    Refill:  0    Patient Instructions  Medication Instructions:  Your physician recommends that you continue on your current medications as directed. Please refer to the Current Medication list given to you today.    Labwork: none  Testing/Procedures: none  Follow-Up:  Your physician recommends that you schedule a follow-up appointment in: 6 months  Any Other Special Instructions Will Be Listed Below (If Applicable).  If you need a refill on your cardiac medications before your next appointment, please call your pharmacy.    SignedFinis Bud, NP  11/22/2022 12:05 PM    [place]

## 2022-11-20 NOTE — Patient Instructions (Signed)

## 2022-11-21 ENCOUNTER — Other Ambulatory Visit: Payer: Self-pay

## 2022-11-21 ENCOUNTER — Telehealth: Payer: Self-pay | Admitting: Nurse Practitioner

## 2022-11-21 LAB — MISC LABCORP TEST (SEND OUT): Labcorp test code: 123218

## 2022-11-21 NOTE — Telephone Encounter (Signed)
Office note sent per request to Elmyra Ricks, transplant coordinator @ [facility].

## 2022-11-21 NOTE — Telephone Encounter (Signed)
   Elmyra Ricks transplant coordinator at [facility] called to get a copy of the pt's OV notes from yesterday. She gave fax# [phone]

## 2022-11-25 ENCOUNTER — Inpatient Hospital Stay: Payer: Medicare HMO | Attending: Hematology | Admitting: Hematology

## 2022-11-25 ENCOUNTER — Inpatient Hospital Stay: Payer: Medicare HMO

## 2022-11-25 ENCOUNTER — Other Ambulatory Visit: Payer: Medicare HMO

## 2022-11-25 VITALS — BP 126/71 | HR 65 | Temp 97.7°F | Resp 18 | Ht 74.0 in | Wt 193.2 lb

## 2022-11-25 DIAGNOSIS — Z79624 Long term (current) use of inhibitors of nucleotide synthesis: Secondary | ICD-10-CM | POA: Insufficient documentation

## 2022-11-25 DIAGNOSIS — Z01818 Encounter for other preprocedural examination: Secondary | ICD-10-CM | POA: Diagnosis not present

## 2022-11-25 DIAGNOSIS — C9000 Multiple myeloma not having achieved remission: Secondary | ICD-10-CM | POA: Insufficient documentation

## 2022-11-25 DIAGNOSIS — Z882 Allergy status to sulfonamides status: Secondary | ICD-10-CM | POA: Diagnosis not present

## 2022-11-25 DIAGNOSIS — Z79899 Other long term (current) drug therapy: Secondary | ICD-10-CM | POA: Insufficient documentation

## 2022-11-25 DIAGNOSIS — C61 Malignant neoplasm of prostate: Secondary | ICD-10-CM | POA: Diagnosis not present

## 2022-11-25 DIAGNOSIS — C9002 Multiple myeloma in relapse: Secondary | ICD-10-CM | POA: Diagnosis not present

## 2022-11-25 DIAGNOSIS — Z7961 Long term (current) use of immunomodulator: Secondary | ICD-10-CM | POA: Insufficient documentation

## 2022-11-25 DIAGNOSIS — Z52011 Autologous donor, stem cells: Secondary | ICD-10-CM | POA: Insufficient documentation

## 2022-11-25 DIAGNOSIS — Z7982 Long term (current) use of aspirin: Secondary | ICD-10-CM | POA: Insufficient documentation

## 2022-11-25 NOTE — Patient Instructions (Addendum)
[place] at [facility] Discharge Instructions   You were seen and examined today by Dr. Delton Coombes.  He reviewed the results of your lab work. The daratumumab immunofixation is now showing any sign of abnormal protein.  The bone marrow biopsy done at [facility] was good.   You will follow with [facility] until after bone marrow transplant.  Once they release you from [facility] after transplant, we will follow you here and discuss with you maintenance therapy.   We will schedule you for follow up here once your process with [facility] is complete.    Thank you for choosing [place] at [facility] to provide your oncology and hematology care.  To afford each patient quality time with our provider, please arrive at least 15 minutes before your scheduled appointment time.   If you have a lab appointment with the [facility] please come in thru the Main Entrance and check in at the main information desk.  You need to re-schedule your appointment should you arrive 10 or more minutes late.  We strive to give you quality time with our providers, and arriving late affects you and other patients whose appointments are after yours.  Also, if you no show three or more times for appointments you may be dismissed from the clinic at the providers discretion.     Again, thank you for choosing [facility].  Our hope is that these requests will decrease the amount of time that you wait before being seen by our physicians.       _____________________________________________________________  Should you have questions after your visit to [facility], please contact our office at [phone] and follow the prompts.  Our office hours are 8:00 a.m. and 4:30 p.m. Monday - Friday.  Please note that voicemails left after 4:00 p.m. may not be returned until the following business day.  We are closed weekends and major holidays.  You do have access  to a nurse 24-7, just call the main number to the clinic [phone] and do not press any options, hold on the line and a nurse will answer the phone.    For prescription refill requests, have your pharmacy contact our office and allow 72 hours.    Due to Covid, you will need to wear a mask upon entering the hospital. If you do not have a mask, a mask will be given to you at the Main Entrance upon arrival. For doctor visits, patients may have 1 support person age 38 or older with them. For treatment visits, patients can not have anyone with them due to social distancing guidelines and our immunocompromised population.

## 2022-11-28 DIAGNOSIS — Z52091 Other blood donor, stem cells: Secondary | ICD-10-CM | POA: Diagnosis not present

## 2022-11-28 DIAGNOSIS — Z01818 Encounter for other preprocedural examination: Secondary | ICD-10-CM | POA: Diagnosis not present

## 2022-11-28 DIAGNOSIS — C9000 Multiple myeloma not having achieved remission: Secondary | ICD-10-CM | POA: Diagnosis not present

## 2022-11-28 DIAGNOSIS — Z52011 Autologous donor, stem cells: Secondary | ICD-10-CM | POA: Diagnosis not present

## 2022-11-29 DIAGNOSIS — Z52011 Autologous donor, stem cells: Secondary | ICD-10-CM | POA: Diagnosis not present

## 2022-11-29 DIAGNOSIS — C9000 Multiple myeloma not having achieved remission: Secondary | ICD-10-CM | POA: Diagnosis not present

## 2022-11-29 DIAGNOSIS — Z95828 Presence of other vascular implants and grafts: Secondary | ICD-10-CM | POA: Diagnosis not present

## 2022-11-30 ENCOUNTER — Other Ambulatory Visit: Payer: Self-pay

## 2022-11-30 ENCOUNTER — Emergency Department (HOSPITAL_COMMUNITY)
Admission: EM | Admit: 2022-11-30 | Discharge: 2022-11-30 | Disposition: A | Discharge status: Home / Self Care | Payer: Medicare HMO | Attending: Emergency Medicine | Admitting: Emergency Medicine

## 2022-11-30 ENCOUNTER — Emergency Department (HOSPITAL_COMMUNITY): Payer: Medicare HMO

## 2022-11-30 ENCOUNTER — Encounter (HOSPITAL_COMMUNITY): Payer: Self-pay | Admitting: Emergency Medicine

## 2022-11-30 DIAGNOSIS — R1013 Epigastric pain: Secondary | ICD-10-CM | POA: Diagnosis not present

## 2022-11-30 DIAGNOSIS — Z7982 Long term (current) use of aspirin: Secondary | ICD-10-CM | POA: Diagnosis not present

## 2022-11-30 DIAGNOSIS — R0789 Other chest pain: Secondary | ICD-10-CM | POA: Diagnosis not present

## 2022-11-30 DIAGNOSIS — R1010 Upper abdominal pain, unspecified: Secondary | ICD-10-CM | POA: Diagnosis not present

## 2022-11-30 DIAGNOSIS — K7689 Other specified diseases of liver: Secondary | ICD-10-CM | POA: Diagnosis not present

## 2022-11-30 DIAGNOSIS — R079 Chest pain, unspecified: Secondary | ICD-10-CM | POA: Diagnosis not present

## 2022-11-30 LAB — CBC WITH DIFFERENTIAL/PLATELET
Abs Immature Granulocytes: 0.5 10*3/uL — ABNORMAL HIGH (ref 0.00–0.07)
Band Neutrophils: 14 %
Basophils Absolute: 0 10*3/uL (ref 0.0–0.1)
Basophils Relative: 0 %
Eosinophils Absolute: 0 10*3/uL (ref 0.0–0.5)
Eosinophils Relative: 0 %
HCT: 32.3 % — ABNORMAL LOW (ref 39.0–52.0)
Hemoglobin: 10.8 g/dL — ABNORMAL LOW (ref 13.0–17.0)
Lymphocytes Relative: 5 %
Lymphs Abs: 0.8 10*3/uL (ref 0.7–4.0)
MCH: 33.2 pg (ref 26.0–34.0)
MCHC: 33.4 g/dL (ref 30.0–36.0)
MCV: 99.4 fL (ref 80.0–100.0)
Metamyelocytes Relative: 3 %
Monocytes Absolute: 0.3 10*3/uL (ref 0.1–1.0)
Monocytes Relative: 2 %
Neutro Abs: 14.9 10*3/uL — ABNORMAL HIGH (ref 1.7–7.7)
Neutrophils Relative %: 76 %
Platelets: 147 10*3/uL — ABNORMAL LOW (ref 150–400)
RBC: 3.25 MIL/uL — ABNORMAL LOW (ref 4.22–5.81)
RDW: 15.8 % — ABNORMAL HIGH (ref 11.5–15.5)
WBC: 16.5 10*3/uL — ABNORMAL HIGH (ref 4.0–10.5)
nRBC: 0 % (ref 0.0–0.2)

## 2022-11-30 LAB — COMPREHENSIVE METABOLIC PANEL
ALT: 19 U/L (ref 0–44)
AST: 18 U/L (ref 15–41)
Albumin: 3.4 g/dL — ABNORMAL LOW (ref 3.5–5.0)
Alkaline Phosphatase: 56 U/L (ref 38–126)
Anion gap: 6 (ref 5–15)
BUN: 11 mg/dL (ref 8–23)
CO2: 24 mmol/L (ref 22–32)
Calcium: 8.5 mg/dL — ABNORMAL LOW (ref 8.9–10.3)
Chloride: 113 mmol/L — ABNORMAL HIGH (ref 98–111)
Creatinine, Ser: 1.05 mg/dL (ref 0.61–1.24)
GFR, Estimated: >60 mL/min (ref >60)
Glucose, Bld: 103 mg/dL — ABNORMAL HIGH (ref 70–99)
Potassium: 3.8 mmol/L (ref 3.5–5.1)
Sodium: 143 mmol/L (ref 135–145)
Total Bilirubin: 0.7 mg/dL (ref 0.3–1.2)
Total Protein: 5.3 g/dL — ABNORMAL LOW (ref 6.5–8.1)

## 2022-11-30 LAB — LIPASE, BLOOD: Lipase: 29 U/L (ref 11–51)

## 2022-11-30 LAB — TROPONIN I (HIGH SENSITIVITY)
Troponin I (High Sensitivity): 6 ng/L (ref <18)
Troponin I (High Sensitivity): 6 ng/L (ref <18)

## 2022-11-30 MED ORDER — ALUM & MAG HYDROXIDE-SIMETH 200-200-20 MG/5ML PO SUSP
30.0000 mL | Freq: Once | ORAL | Status: AC
  Administered 2022-11-30: 30 mL via ORAL
  Filled 2022-11-30: qty 30

## 2022-11-30 NOTE — ED Triage Notes (Signed)
Pt c/o chest pain that started last night in the middle of the chest, pt states he was [facility] to [facility] for medication to build up stem cells, but he was having chest pain, Hx of cancer, denies N/V, denies SOB

## 2022-11-30 NOTE — Discharge Instructions (Signed)
Continue to take your Protonix as directed.  You may take Pepcid, Tums, or Maalox if you develop recurrent symptoms.  If you develop recurrent, continued, or worsening chest pain, shortness of breath, fever, vomiting, abdominal or back pain, or any other new/concerning symptoms then return to the ER for evaluation.

## 2022-11-30 NOTE — ED Provider Notes (Signed)
[facility] EMERGENCY DEPARTMENT Provider Note   CSN: [id number] Arrival date & time: 11/30/22  0900     History  Chief Complaint  Patient presents with   Chest Pain    Luis Stewart is a 71 y.o. male.  HPI 71 year old male with history of multiple myeloma who is being set up for stem cell transplant at [facility] presents with abdominal/chest pain.  Started having abdominal pain in his epigastrium last night.  Seem to come and go.  Did seem to be worse when he was eating.  No vomiting but he had 2 extra bowel movements last night that were not diarrhea.  Today it is more in his chest behind the sternum.  Seems to come and go with no real obvious cause.  At most it lasts a few minutes at a time.  Feels dull/heavy.  No specific cause such as exertion or position change.  No fevers or shortness of breath.  Right now he is asymptomatic.  Home Medications Prior to Admission medications   Medication Sig Start Date End Date Taking? Authorizing Provider  acetaminophen (TYLENOL) 500 MG tablet Take 1,000 mg by mouth every 6 (six) hours as needed for mild pain or moderate pain.    [provider]  acyclovir (ZOVIRAX) 400 MG tablet TAKE 1 TABLET BY MOUTH TWICE A DAY 10/06/22   [name], MD  aspirin EC 81 MG tablet Take 81 mg by mouth daily. Swallow whole.    [provider]  benzonatate (TESSALON) 100 MG capsule Take 1 capsule (100 mg total) by mouth 2 (two) times daily as needed for cough. 10/10/21   Lindell Spar, MD  Carfilzomib (KYPROLIS IV) Inject into the vein once a week. 04/15/22   [provider]  Cholecalciferol (VITAMIN D) 2000 units CAPS Take 2,000 Units by mouth every other day.    [provider]  daratumumab-hyaluronidase-fihj (DARZALEX FASPRO) 1800-30000 MG-UT/15ML SOLN Inject 1,800 mg into the skin once a week.    [provider]  lenalidomide (REVLIMID) 15 MG capsule Take 1 capsule (15 mg total) by mouth  daily. Take for 21 days, then hold for 7 days. Repeat every 28 days. 10/28/22   [name], MD  lidocaine-prilocaine (EMLA) cream Apply a small amount to port a cath site and cover with plastic wrap (do not rub in) 1 hour prior to infusion appointments 04/15/22   [name], MD  methocarbamol (ROBAXIN) 500 MG tablet Take 1 tablet (500 mg total) by mouth 3 (three) times daily. 12/02/21   Carole Civil, MD  Multiple Vitamin (MULTIVITAMIN WITH MINERALS) TABS tablet Take 1 tablet by mouth daily with breakfast. Centrum Silver for Men    [provider]  nirmatrelvir & ritonavir (PAXLOVID, 300/100,) 20 x 150 MG & 10 x 100MG TBPK Take pack as directed.  Do not take Revlamid while on therapy and resume day after last dose. 08/14/22   [name], MD  nitroGLYCERIN (NITROSTAT) 0.4 MG SL tablet Place 1 tablet (0.4 mg total) under the tongue every 5 (five) minutes x 3 doses as needed for chest pain (If no relief after 3rd dose, call or go to ED.). 11/20/22   Finis Bud, NP  pantoprazole (PROTONIX) 40 MG tablet TAKE 1 TABLET BY MOUTH EVERY OTHER DAY 07/28/22   Lindell Spar, MD  prochlorperazine (COMPAZINE) 10 MG tablet Take 1 tablet (10 mg total) by mouth every 6 (six) hours as needed for nausea or vomiting. 04/15/22   [name],  MD  tamsulosin (FLOMAX) 0.4 MG CAPS capsule Take 1 capsule (0.4 mg total) by mouth daily. 04/30/22   Summerlin, Berneice Heinrich, PA-C  traMADol (ULTRAM) 50 MG tablet Take 1 tablet (50 mg total) by mouth every 6 (six) hours as needed. 04/18/22   Aviva Signs, MD      Allergies    Sulfa antibiotics    Review of Systems   Review of Systems  Constitutional:  Negative for fever.  Respiratory:  Negative for shortness of breath.   Cardiovascular:  Positive for chest pain.  Gastrointestinal:  Positive for abdominal pain. Negative for diarrhea and vomiting.    Physical Exam Updated Vital Signs BP 127/70   Pulse (!) 59   Temp  98.1 F (36.7 C) (Oral)   Resp 17   Ht _0  (1.88 m)   Wt 87.1 kg   SpO2 100%   BMI 24.65 kg/m  Physical Exam Vitals and nursing note reviewed.  Constitutional:      Appearance: He is well-developed.  HENT:     Head: Normocephalic and atraumatic.  Cardiovascular:     Rate and Rhythm: Normal rate and regular rhythm.     Heart sounds: Normal heart sounds.  Pulmonary:     Effort: Pulmonary effort is normal.     Breath sounds: Normal breath sounds.  Abdominal:     Palpations: Abdomen is soft.     Tenderness: There is abdominal tenderness (mild) in the epigastric area.  Skin:    General: Skin is warm and dry.  Neurological:     Mental Status: He is alert.     ED Results / Procedures / Treatments   Labs (all labs ordered are listed, but only abnormal results are displayed) Labs Reviewed  COMPREHENSIVE METABOLIC PANEL - Abnormal; Notable for the following components:      Result Value   Chloride 113 (*)    Glucose, Bld 103 (*)    Calcium 8.5 (*)    Total Protein 5.3 (*)    Albumin 3.4 (*)    All other components within normal limits  CBC WITH DIFFERENTIAL/PLATELET - Abnormal; Notable for the following components:   WBC 16.5 (*)    RBC 3.25 (*)    Hemoglobin 10.8 (*)    HCT 32.3 (*)    RDW 15.8 (*)    Platelets 147 (*)    Neutro Abs 14.9 (*)    Abs Immature Granulocytes 0.50 (*)    All other components within normal limits  LIPASE, BLOOD  TROPONIN I (HIGH SENSITIVITY)  TROPONIN I (HIGH SENSITIVITY)    EKG EKG Interpretation  Date/Time:  Sunday November 30 2022 09:10:22 EST Ventricular Rate:  66 PR Interval:  181 QRS Duration: 103 QT Interval:  414 QTC Calculation: 434 R Axis:   44 Text Interpretation: Sinus or ectopic atrial rhythm RSR' in V1 or V2, right VCD or RVH Probable left ventricular hypertrophy similar to Aug 2019 Confirmed by Sherwood Gambler [phone]) on 11/30/2022 9:19:59 AM  Radiology US Abdomen Limited RUQ (LIVER/GB)  Result Date:  11/30/2022 CLINICAL DATA:  [id number] Epigastric abdominal pain 114841 EXAM: ULTRASOUND ABDOMEN LIMITED COMPARISON:  10/12/2015 FINDINGS: Innumerable hepatic cysts identified measuring up to 6 cm. No intrahepatic ductal dilatation identified. Hepatopetal flow in the main portal vein. The gallbladder demonstrates no stones, wall thickening or pericholecystic fluid. CBD measured 0.3cm. IMPRESSION: Innumerable hepatic cysts measuring up to 6 cm. Otherwise unremarkable examination. Electronically Signed   By: Sammie Bench M.D.   On: 11/30/2022 13:33  DG Chest 2 View  Result Date: 11/30/2022 CLINICAL DATA:  Chest pain EXAM: CHEST - 2 VIEW COMPARISON:  04/18/2022 FINDINGS: The lungs are clear without focal pneumonia, edema, pneumothorax or pleural effusion. Interstitial markings are diffusely coarsened with chronic features. The cardiopericardial silhouette is within normal limits for size. Left-sided Port-A-Cath again noted. Right IJ central line tip overlies the mid to distal SVC. Telemetry leads overlie the chest. IMPRESSION: Chronic interstitial coarsening without acute cardiopulmonary findings. Electronically Signed   By: Misty Stanley M.D.   On: 11/30/2022 09:58    Procedures Procedures    Medications Ordered in ED Medications  alum & mag hydroxide-simeth (MAALOX/MYLANTA) 200-200-20 MG/5ML suspension 30 mL (30 mLs Oral Given 11/30/22 1002)    ED Course/ Medical Decision Making/ A&P                           Medical Decision Making Amount and/or Complexity of Data Reviewed Labs: ordered.    Details: Troponins negative x 2.  Normal LFTs/lipase.  WBC elevation is expected given the treatments by [facility] in preparation for his stem cell transplant. Radiology: ordered and independent interpretation performed.    Details: No CHF on chest x-ray and no obvious cholecystitis on right upper quadrant ultrasound ECG/medicine tests: independent interpretation performed.    Details: No acute  ischemia  Risk OTC drugs.   Patient is essentially asymptomatic.  Symptoms sound like they are probably more gastritis/GERD but workup was obtained given age.  He is not tachycardic, hypoxic, hypotensive.  He does have a remote history of a PE but this seems unlikely to be causing his symptoms today as they sound more GERD like and he has some mild epigastric tenderness.  Do not think workup for PE is necessary.  Troponins negative as above.  Probably gastritis and he does feel better with some Maalox.  I do not think CT is warranted given minimal tenderness.  At this point I think he is stable for discharge and can follow-up with his PCP and oncology.  He does have a leukocytosis but he just got filgrastim and so I think this is likely the cause.  Will discharge home with return precautions.        Final Clinical Impression(s) / ED Diagnoses Final diagnoses:  Upper abdominal pain    Rx / DC Orders ED Discharge Orders     None         Sherwood Gambler, MD 11/30/22 1427

## 2022-12-01 DIAGNOSIS — Z95828 Presence of other vascular implants and grafts: Secondary | ICD-10-CM | POA: Diagnosis not present

## 2022-12-01 DIAGNOSIS — C9000 Multiple myeloma not having achieved remission: Secondary | ICD-10-CM | POA: Diagnosis not present

## 2022-12-01 DIAGNOSIS — Z52011 Autologous donor, stem cells: Secondary | ICD-10-CM | POA: Diagnosis not present

## 2022-12-02 ENCOUNTER — Other Ambulatory Visit: Payer: Medicare HMO

## 2022-12-02 ENCOUNTER — Ambulatory Visit: Payer: Medicare HMO

## 2022-12-02 DIAGNOSIS — Z7689 Persons encountering health services in other specified circumstances: Secondary | ICD-10-CM | POA: Diagnosis not present

## 2022-12-02 DIAGNOSIS — C9000 Multiple myeloma not having achieved remission: Secondary | ICD-10-CM | POA: Diagnosis not present

## 2022-12-02 DIAGNOSIS — Z52011 Autologous donor, stem cells: Secondary | ICD-10-CM | POA: Diagnosis not present

## 2022-12-03 DIAGNOSIS — C9000 Multiple myeloma not having achieved remission: Secondary | ICD-10-CM | POA: Diagnosis not present

## 2022-12-03 DIAGNOSIS — Z95828 Presence of other vascular implants and grafts: Secondary | ICD-10-CM | POA: Diagnosis not present

## 2022-12-03 DIAGNOSIS — R7989 Other specified abnormal findings of blood chemistry: Secondary | ICD-10-CM | POA: Diagnosis not present

## 2022-12-03 DIAGNOSIS — Z52011 Autologous donor, stem cells: Secondary | ICD-10-CM | POA: Diagnosis not present

## 2022-12-04 DIAGNOSIS — Z52011 Autologous donor, stem cells: Secondary | ICD-10-CM | POA: Diagnosis not present

## 2022-12-04 DIAGNOSIS — R7989 Other specified abnormal findings of blood chemistry: Secondary | ICD-10-CM | POA: Diagnosis not present

## 2022-12-04 DIAGNOSIS — C9000 Multiple myeloma not having achieved remission: Secondary | ICD-10-CM | POA: Diagnosis not present

## 2022-12-09 ENCOUNTER — Ambulatory Visit: Payer: Medicare HMO

## 2022-12-09 ENCOUNTER — Other Ambulatory Visit: Payer: Medicare HMO

## 2022-12-11 ENCOUNTER — Inpatient Hospital Stay: Payer: Medicare HMO

## 2022-12-11 NOTE — Patient Instructions (Signed)
[place]  Discharge Instructions: Thank you for choosing [place] to provide your oncology and hematology care.  If you have a lab appointment with the [facility], please come in thru the Main Entrance and check in at the main information desk.  Wear comfortable clothing and clothing appropriate for easy access to any Portacath or PICC line.   We strive to give you quality time with your provider. You may need to reschedule your appointment if you arrive late (15 or more minutes).  Arriving late affects you and other patients whose appointments are after yours.  Also, if you miss three or more appointments without notifying the office, you may be dismissed from the clinic at the provider's discretion.      For prescription refill requests, have your pharmacy contact our office and allow 72 hours for refills to be completed.    Today you received the following Hickman dressing changed.     To help prevent nausea and vomiting after your treatment, we encourage you to take your nausea medication as directed.  BELOW ARE SYMPTOMS THAT SHOULD BE REPORTED IMMEDIATELY: *FEVER GREATER THAN 100.4 F (38 C) OR HIGHER *CHILLS OR SWEATING *NAUSEA AND VOMITING THAT IS NOT CONTROLLED WITH YOUR NAUSEA MEDICATION *UNUSUAL SHORTNESS OF BREATH *UNUSUAL BRUISING OR BLEEDING *URINARY PROBLEMS (pain or burning when urinating, or frequent urination) *BOWEL PROBLEMS (unusual diarrhea, constipation, pain near the anus) TENDERNESS IN MOUTH AND THROAT WITH OR WITHOUT PRESENCE OF ULCERS (sore throat, sores in mouth, or a toothache) UNUSUAL RASH, SWELLING OR PAIN  UNUSUAL VAGINAL DISCHARGE OR ITCHING   Items with * indicate a potential emergency and should be followed up as soon as possible or go to the Emergency Department if any problems should occur.  Please show the CHEMOTHERAPY ALERT CARD or IMMUNOTHERAPY ALERT CARD at check-in to the Emergency Department and triage  nurse.  Should you have questions after your visit or need to cancel or reschedule your appointment, please contact [facility] [phone]  and follow the prompts.  Office hours are 8:00 a.m. to 4:30 p.m. Monday - Friday. Please note that voicemails left after 4:00 p.m. may not be returned until the following business day.  We are closed weekends and major holidays. You have access to a nurse at all times for urgent questions. Please call the main number to the clinic [phone] and follow the prompts.  For any non-urgent questions, you may also contact your provider using MyChart. We now offer e-Visits for anyone 24 and older to request care online for non-urgent symptoms. For details visit [email or web address].   Also download the MyChart app! Go to the app store, search "MyChart", open the app, select [place], and log in with your MyChart username and password.  Masks are optional in the cancer centers. If you would like for your care team to wear a mask while they are taking care of you, please let them know. You may have one support person who is at least 72 years old accompany you for your appointments.

## 2022-12-11 NOTE — Progress Notes (Signed)
Patient arrived for Hickman dressing change. Dressing changed and cap changed. Patient left in satisfactory condition.

## 2022-12-18 ENCOUNTER — Inpatient Hospital Stay: Payer: Medicare HMO

## 2022-12-18 NOTE — Progress Notes (Signed)
Patient arrived for Hickman dressing change. Dressing changed and caps changed. Patient left in satisfactory condition.

## 2022-12-18 NOTE — Patient Instructions (Signed)
[place]  Discharge Instructions: Thank you for choosing [place] to provide your oncology and hematology care.  If you have a lab appointment with the [facility], please come in thru the Main Entrance and check in at the main information desk.  Wear comfortable clothing and clothing appropriate for easy access to any Portacath or PICC line.   We strive to give you quality time with your provider. You may need to reschedule your appointment if you arrive late (15 or more minutes).  Arriving late affects you and other patients whose appointments are after yours.  Also, if you miss three or more appointments without notifying the office, you may be dismissed from the clinic at the provider's discretion.      For prescription refill requests, have your pharmacy contact our office and allow 72 hours for refills to be completed.    Today you received the following Hickman dressing change, return as scheduled.   To help prevent nausea and vomiting after your treatment, we encourage you to take your nausea medication as directed.  BELOW ARE SYMPTOMS THAT SHOULD BE REPORTED IMMEDIATELY: *FEVER GREATER THAN 100.4 F (38 C) OR HIGHER *CHILLS OR SWEATING *NAUSEA AND VOMITING THAT IS NOT CONTROLLED WITH YOUR NAUSEA MEDICATION *UNUSUAL SHORTNESS OF BREATH *UNUSUAL BRUISING OR BLEEDING *URINARY PROBLEMS (pain or burning when urinating, or frequent urination) *BOWEL PROBLEMS (unusual diarrhea, constipation, pain near the anus) TENDERNESS IN MOUTH AND THROAT WITH OR WITHOUT PRESENCE OF ULCERS (sore throat, sores in mouth, or a toothache) UNUSUAL RASH, SWELLING OR PAIN  UNUSUAL VAGINAL DISCHARGE OR ITCHING   Items with * indicate a potential emergency and should be followed up as soon as possible or go to the Emergency Department if any problems should occur.  Please show the CHEMOTHERAPY ALERT CARD or IMMUNOTHERAPY ALERT CARD at check-in to the Emergency  Department and triage nurse.  Should you have questions after your visit or need to cancel or reschedule your appointment, please contact [facility] [phone]  and follow the prompts.  Office hours are 8:00 a.m. to 4:30 p.m. Monday - Friday. Please note that voicemails left after 4:00 p.m. may not be returned until the following business day.  We are closed weekends and major holidays. You have access to a nurse at all times for urgent questions. Please call the main number to the clinic [phone] and follow the prompts.  For any non-urgent questions, you may also contact your provider using MyChart. We now offer e-Visits for anyone 62 and older to request care online for non-urgent symptoms. For details visit [email or web address].   Also download the MyChart app! Go to the app store, search "MyChart", open the app, select [place], and log in with your MyChart username and password.

## 2022-12-19 ENCOUNTER — Encounter (HOSPITAL_COMMUNITY): Payer: Self-pay | Admitting: Hematology

## 2022-12-19 ENCOUNTER — Encounter: Payer: Self-pay | Admitting: Hematology

## 2022-12-19 NOTE — Progress Notes (Signed)
[place], [place] [address]   CLINIC:  Medical Oncology/Hematology  PCP:  Lindell Spar, MD [address] / [place] [address] [phone]   REASON FOR VISIT:  Follow-up for prostate cancer and multiple myeloma  PRIOR THERAPY: none  NGS Results: not done  CURRENT THERAPY: Carfilzomib (20/70) + Daratumumab SQ + Dexamethasone (20/40) DaraKRd q28d  BRIEF ONCOLOGIC HISTORY:  Oncology History  Multiple myeloma ([place])  04/09/2022 Initial Diagnosis   Multiple myeloma ([place])   04/15/2022 -  Chemotherapy   Patient is on Treatment Plan : MYELOMA RELAPSED/REFRACTORY Carfilzomib (20/70) + Daratumumab SQ + Dexamethasone (20/40) DaraKd q28d       CANCER STAGING:  Cancer Staging  Multiple myeloma ([place]) Staging form: Plasma Cell Myeloma and Plasma Cell Disorders, AJCC 8th Edition - Clinical: No stage assigned - Unsigned   INTERVAL HISTORY:  [name], a 71 y.o. male, seen for follow-up of multiple myeloma and prostate cancer.  He was evaluated at [facility] transplant center.  He had a bone marrow biopsy on 11/14/2022.  He is being planned for possible bone marrow transplant in January.  REVIEW OF SYSTEMS:  Review of Systems  Neurological:  Positive for numbness (hands and feet tingling).  All other systems reviewed and are negative.   PAST MEDICAL/SURGICAL HISTORY:  Past Medical History:  Diagnosis Date   Arthritis    Asbestosis ([place])    Closed fracture of distal end of fibula with tibia with routine healing 05/28/2020   COVID-19 09/03/2021   Cyst of kidney, acquired    Headache    History of fracture of leg    Right, childhood   Hx of migraine headaches    Hyperlipidemia    Neuropathy    PE (pulmonary thromboembolism) ([place])    After knee surgery   Prostate cancer ([place]) 2015   Adenocarcinoma by biopsy   Past Surgical History:  Procedure Laterality Date   BIOPSY PROSTATE  2003 and 2005    COLONOSCOPY  11/28/2008   Dr. Rourk:internal hemorrhoids/tortus colon/ascending colon polyps, tubular adenomas   COLONOSCOPY N/A 09/04/2014   Procedure: COLONOSCOPY;  Surgeon: Daneil Dolin, MD;  [facility];  Service: Endoscopy;  Laterality: N/A;  9:30   COLONOSCOPY N/A 07/17/2017   Procedure: COLONOSCOPY;  Surgeon: Danie Binder, MD;  [facility];  Service: Endoscopy;  Laterality: N/A;   ESOPHAGOGASTRODUODENOSCOPY N/A 07/16/2017   Procedure: ESOPHAGOGASTRODUODENOSCOPY (EGD);  Surgeon: Danie Binder, MD;  [facility];  Service: Endoscopy;  Laterality: N/A;   GIVENS CAPSULE STUDY  07/16/2017   Procedure: GIVENS CAPSULE STUDY;  Surgeon: Danie Binder, MD;  [facility];  Service: Endoscopy;;   JOINT REPLACEMENT N/A    Phreesia 01/12/2021   POLYPECTOMY  07/17/2017   Procedure: POLYPECTOMY;  Surgeon: Danie Binder, MD;  [facility];  Service: Endoscopy;;  cecal   PORTACATH PLACEMENT Left 04/18/2022   Procedure: INSERTION PORT-A-CATH;  Surgeon: Aviva Signs, MD;  Location: [facility];  Service: General;  Laterality: Left;   TOTAL KNEE ARTHROPLASTY Right 06/10/2017   TOTAL KNEE ARTHROPLASTY Right 06/10/2017   Procedure: TOTAL KNEE ARTHROPLASTY;  Surgeon: [name], MD;  Location: [place] [facility];  Service: Orthopedics;  Laterality: Right;    SOCIAL HISTORY:  Social History   Socioeconomic History   Marital status: Married    Spouse name: Not on file   Number of children: Not on file  Years of education: Not on file   Highest education level: Not on file  Occupational History   Occupation: Retired  Tobacco Use   Smoking status: Never   Smokeless tobacco: Never  Vaping Use   Vaping Use: Never used  Substance and Sexual Activity   Alcohol use: No   Drug use: No   Sexual activity: Yes  Other Topics Concern   Not on file  Social History Narrative   Not on file   Social Determinants of Health   Financial Resource Strain: Not  on file  Food Insecurity: Not on file  Transportation Needs: No Transportation Needs (02/03/2022)   PRAPARE - Hydrologist (Medical): No    Lack of Transportation (Non-Medical): No  Physical Activity: Sufficiently Active (02/03/2022)   Exercise Vital Sign    Days of Exercise per Week: 4 days    Minutes of Exercise per Session: 60 min  Stress: Not on file  Social Connections: Moderately Integrated (02/03/2022)   Social Connection and Isolation Panel [NHANES]    Frequency of Communication with Friends and Family: More than three times a week    Frequency of Social Gatherings with Friends and Family: More than three times a week    Attends Religious Services: More than 4 times per year    Active Member of [employer] or Organizations: No    Attends Archivist Meetings: Never    Marital Status: Married  Human resources officer Violence: Not At Risk (02/03/2022)   Humiliation, Afraid, Rape, and Kick questionnaire    Fear of Current or Ex-Partner: No    Emotionally Abused: No    Physically Abused: No    Sexually Abused: No    FAMILY HISTORY:  Family History  Problem Relation Age of Onset   Diabetes Mother    Hypertension Mother    Obesity Mother    Heart disease Mother    Mental illness Mother    Hypertension Sister    Obesity Sister    Arthritis Sister    Deep vein thrombosis Father    Dementia Father    Heart disease Father        CABG   Colon cancer Neg Hx     CURRENT MEDICATIONS:  Current Outpatient Medications  Medication Sig Dispense Refill   acetaminophen (TYLENOL) 500 MG tablet Take 1,000 mg by mouth every 6 (six) hours as needed for mild pain or moderate pain.     acyclovir (ZOVIRAX) 400 MG tablet TAKE 1 TABLET BY MOUTH TWICE A DAY 180 tablet 2   aspirin EC 81 MG tablet Take 81 mg by mouth daily. Swallow whole.     benzonatate (TESSALON) 100 MG capsule Take 1 capsule (100 mg total) by mouth 2 (two) times daily as needed for cough. 20 capsule  0   Carfilzomib (KYPROLIS IV) Inject into the vein once a week.     Cholecalciferol (VITAMIN D) 2000 units CAPS Take 2,000 Units by mouth every other day.     daratumumab-hyaluronidase-fihj (DARZALEX FASPRO) 1800-30000 MG-UT/15ML SOLN Inject 1,800 mg into the skin once a week.     lenalidomide (REVLIMID) 15 MG capsule Take 1 capsule (15 mg total) by mouth daily. Take for 21 days, then hold for 7 days. Repeat every 28 days. (Patient not taking: Reported on 12/11/2022) 21 capsule 0   lidocaine-prilocaine (EMLA) cream Apply a small amount to port a cath site and cover with plastic wrap (do not rub in) 1 hour prior to infusion  appointments 30 g 0   methocarbamol (ROBAXIN) 500 MG tablet Take 1 tablet (500 mg total) by mouth 3 (three) times daily. 60 tablet 1   Multiple Vitamin (MULTIVITAMIN WITH MINERALS) TABS tablet Take 1 tablet by mouth daily with breakfast. Centrum Silver for Men     nirmatrelvir & ritonavir (PAXLOVID, 300/100,) 20 x 150 MG & 10 x 100MG TBPK Take pack as directed.  Do not take Revlamid while on therapy and resume day after last dose. 1 each 0   nitroGLYCERIN (NITROSTAT) 0.4 MG SL tablet Place 1 tablet (0.4 mg total) under the tongue every 5 (five) minutes x 3 doses as needed for chest pain (If no relief after 3rd dose, call or go to ED.). 30 tablet 0   pantoprazole (PROTONIX) 40 MG tablet TAKE 1 TABLET BY MOUTH EVERY OTHER DAY 45 tablet 1   prochlorperazine (COMPAZINE) 10 MG tablet Take 1 tablet (10 mg total) by mouth every 6 (six) hours as needed for nausea or vomiting. 30 tablet 3   tamsulosin (FLOMAX) 0.4 MG CAPS capsule Take 1 capsule (0.4 mg total) by mouth daily. 30 capsule 11   traMADol (ULTRAM) 50 MG tablet Take 1 tablet (50 mg total) by mouth every 6 (six) hours as needed. 10 tablet 0   No current facility-administered medications for this visit.   Facility-Administered Medications Ordered in Other Visits  Medication Dose Route Frequency Provider Last Rate Last Admin    acetaminophen (TYLENOL) 325 MG tablet            diphenhydrAMINE (BENADRYL) 25 mg capsule             ALLERGIES:  Allergies  Allergen Reactions   Sulfa Antibiotics Other (See Comments)    Unknown reaction. Childhood reaction    PHYSICAL EXAM:  Performance status (ECOG): 1 - Symptomatic but completely ambulatory  Vitals:   11/25/22 0947  BP: 126/71  Pulse: 65  Resp: 18  Temp: 97.7 F (36.5 C)  SpO2: 100%    Wt Readings from Last 3 Encounters:  11/30/22 192 lb (87.1 kg)  11/25/22 193 lb 3.2 oz (87.6 kg)  11/20/22 192 lb 6.4 oz (87.3 kg)   Physical Exam Vitals reviewed.  Constitutional:      Appearance: Normal appearance.  Cardiovascular:     Rate and Rhythm: Normal rate and regular rhythm.     Pulses: Normal pulses.     Heart sounds: Normal heart sounds.  Pulmonary:     Effort: Pulmonary effort is normal.     Breath sounds: Normal breath sounds.  Musculoskeletal:     Right lower leg: No edema.     Left lower leg: No edema.  Neurological:     General: No focal deficit present.     Mental Status: He is alert and oriented to person, place, and time.  Psychiatric:        Mood and Affect: Mood normal.        Behavior: Behavior normal.     LABORATORY DATA:  I have reviewed the labs as listed.     Latest Ref Rng & Units 11/30/2022   10:16 AM 11/18/2022   12:22 PM 11/11/2022    7:53 AM  CBC  WBC 4.0 - 10.5 K/uL 16.5  2.9  3.2   Hemoglobin 13.0 - 17.0 g/dL 10.8  10.1  10.3   Hematocrit 39.0 - 52.0 % 32.3  30.0  30.4   Platelets 150 - 400 K/uL 147  142  151  Latest Ref Rng & Units 11/30/2022   10:16 AM 11/18/2022   12:22 PM 11/11/2022    7:53 AM  CMP  Glucose 70 - 99 mg/dL 103  99  90   BUN 8 - 23 mg/dL _0 Creatinine 0.61 - 1.24 mg/dL 1.05  1.10  1.12   Sodium 135 - 145 mmol/L 143  140  140   Potassium 3.5 - 5.1 mmol/L 3.8  4.0  3.6   Chloride 98 - 111 mmol/L 113  111  112   CO2 22 - 32 mmol/L _1 Calcium 8.9 - 10.3 mg/dL 8.5   8.1  8.0   Total Protein 6.5 - 8.1 g/dL 5.3  5.5  5.6   Total Bilirubin 0.3 - 1.2 mg/dL 0.7  0.6  0.7   Alkaline Phos 38 - 126 U/L 56  43  52   AST 15 - 41 U/L _2 ALT 0 - 44 U/L _3 DIAGNOSTIC IMAGING:  I have independently reviewed the scans and discussed with the patient. US Abdomen Limited RUQ (LIVER/GB)  Result Date: 11/30/2022 CLINICAL DATA:  [id number] Epigastric abdominal pain 114841 EXAM: ULTRASOUND ABDOMEN LIMITED COMPARISON:  10/12/2015 FINDINGS: Innumerable hepatic cysts identified measuring up to 6 cm. No intrahepatic ductal dilatation identified. Hepatopetal flow in the main portal vein. The gallbladder demonstrates no stones, wall thickening or pericholecystic fluid. CBD measured 0.3cm. IMPRESSION: Innumerable hepatic cysts measuring up to 6 cm. Otherwise unremarkable examination. Electronically Signed   By: Sammie Bench M.D.   On: 11/30/2022 13:33   DG Chest 2 View  Result Date: 11/30/2022 CLINICAL DATA:  Chest pain EXAM: CHEST - 2 VIEW COMPARISON:  04/18/2022 FINDINGS: The lungs are clear without focal pneumonia, edema, pneumothorax or pleural effusion. Interstitial markings are diffusely coarsened with chronic features. The cardiopericardial silhouette is within normal limits for size. Left-sided Port-A-Cath again noted. Right IJ central line tip overlies the mid to distal SVC. Telemetry leads overlie the chest. IMPRESSION: Chronic interstitial coarsening without acute cardiopulmonary findings. Electronically Signed   By: Misty Stanley M.D.   On: 11/30/2022 09:58     ASSESSMENT:  Prostate cancer: - TRUS/Bx on 06/14/2015: 1/12 cores positive for adenocarcinoma-10% of the core in the right lateral base revealed Gleason 3+3=6.  Prostate volume 175 cc.  PSA was 16.6.  PSA density 0.09. - Seen by Dr. Louis Meckel in [facility] for robotic prostatectomy.  Due to low-volume prostate cancer and very low PSA density, it was recommended that he continue active  surveillance. - 01/21/2016: Surveillance TRUS/biopsy, following a prostatic MRI which revealed no suspicious prostatic lesions and correlated the patient's large prostate.  All 12 cores were negative for prostate cancer.  Prostate volume was 190 mL. - 08/11/2018: Fusion TRUS/biopsy: Recent prostate MRI-volume 220 mL, no significant lesions.  No evidence of transcapsular/.  Ureteral, SV/bony or lymphatic disease.  Path-1 core (left apex) revealed Gleason 3+3 and 5% of core. - PSA: 17.16 (10/09/2015), 15.4 (11/04/2016), 17.9 (05/15/2017), 18.4 (04/19/2018), 15.9 (02/15/2019), 15.5 (03/20/2020), 22.4 (03/20/2021) - MRI of the lumbar spine with and without contrast on 11/27/2021 done for back pain showed expanded appearance of the sacrum with diffusely abnormal bone marrow signal and contrast-enhancement concerning for metastatic disease. - CT pelvis with contrast on 11/27/2021: Expansile, heterogeneous trabeculated appearance of the sacrum with thickening of the cortex, unchanged from multiple prior exams.  No evidence of pelvic  lymphadenopathy.  Extreme prostatomegaly. -PSMA PET scan (03/13/2022): Markedly enlarged prostate gland with focal activity inferior left aspect of the gland suggesting prostatic adenocarcinoma.  No nodal metastasis in the abdomen or pelvis.  Multifocal skeletal metastasis in the mid sacrum, right sacral ala, subtle lesions in the L2 and several rib lesions. - Degarelix started on 04/15/2022   Social/family history: - Lives at home with his wife.  Retired in 2003 from [employer].  He works part-time work at an [employer].  Non-smoker. - Sister had cholangiocarcinoma.  Paternal first cousin had metastatic cancer.  Paternal uncle had prostate cancer.  3.  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  Core biopsy demonstrates hypercellular marrow (95%) with large atypical  plasma cells arranged in sheets, by CD138 IHC plasma cells comprise 70% of the cellular elements, kappa restricted.  Amyloid was negative. 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  Chronic progressive changes involving the sacrum, left iliac bone including accentuation of trabecular markings and cortical thickening with low FDG uptake suggestive of chronic Paget's disease.  Marked prostate gland enlargement. - BMBX (03/20/2022):Hypercellular bone marrow with at least 25% plasma cells on aspirate with atypical features.  Core biopsy demonstrates hypercellular marrow for age (95%) with large atypical plasma cells arranged in sheets.  By CD138 IHC, plasma cells comprise approximately 70% of cellular elements and are kappa restricted.  Amyloid deposition was negative by Congo red.  Karyotype showed 20 q. deletion.  Myeloma FISH panel showed complex abnormalities. 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  Last PSA is down to 1.42.  2.  IgG kappa multiple myeloma with complex cytogenetics: - He had bone marrow biopsy on 11/14/2022 at [facility].  It showed 50% cellularity with plasma cells less than 1%. - PET scan was done which was negative for any residual disease.  I have reviewed all reports from [facility]. - We are holding all his treatments.  He will have bone marrow stem cell harvest on next week. - He will likely have transplant in January. - I have recommended that he follow-up with his at the discretion of Dr. Feliciana Rossetti.  3.  Normocytic anemia: - Last hemoglobin was 10.1.  4.  Osteopenia (DEXA scan 06/04/2022: T score -2.1): - He was tolerating denosumab very well.  Will hold  it.   Orders placed this encounter:  No orders of the defined types were placed in this encounter.     [name], MD [facility] [phone]

## 2022-12-25 ENCOUNTER — Inpatient Hospital Stay: Payer: Medicare HMO | Attending: Hematology

## 2022-12-25 DIAGNOSIS — Z79899 Other long term (current) drug therapy: Secondary | ICD-10-CM | POA: Insufficient documentation

## 2022-12-25 DIAGNOSIS — C61 Malignant neoplasm of prostate: Secondary | ICD-10-CM | POA: Insufficient documentation

## 2022-12-25 DIAGNOSIS — C9000 Multiple myeloma not having achieved remission: Secondary | ICD-10-CM | POA: Insufficient documentation

## 2022-12-25 DIAGNOSIS — C7951 Secondary malignant neoplasm of bone: Secondary | ICD-10-CM | POA: Insufficient documentation

## 2022-12-25 DIAGNOSIS — M8580 Other specified disorders of bone density and structure, unspecified site: Secondary | ICD-10-CM | POA: Insufficient documentation

## 2022-12-25 DIAGNOSIS — D649 Anemia, unspecified: Secondary | ICD-10-CM | POA: Insufficient documentation

## 2022-12-25 NOTE — Patient Instructions (Signed)
[employer]  Discharge Instructions: Thank you for choosing [facility] to provide your oncology and hematology care.  If you have a lab appointment with the [facility], please come in thru the Main Entrance and check in at the main information desk.  Wear comfortable clothing and clothing appropriate for easy access to any Portacath or PICC line.   We strive to give you quality time with your provider. You may need to reschedule your appointment if you arrive late (15 or more minutes).  Arriving late affects you and other patients whose appointments are after yours.  Also, if you miss three or more appointments without notifying the office, you may be dismissed from the clinic at the provider's discretion.      For prescription refill requests, have your pharmacy contact our office and allow 72 hours for refills to be completed.    Today you received the following [place] dressing change, return as scheduled.   To help prevent nausea and vomiting after your treatment, we encourage you to take your nausea medication as directed.  BELOW ARE SYMPTOMS THAT SHOULD BE REPORTED IMMEDIATELY: *FEVER GREATER THAN 100.4 F (38 C) OR HIGHER *CHILLS OR SWEATING *NAUSEA AND VOMITING THAT IS NOT CONTROLLED WITH YOUR NAUSEA MEDICATION *UNUSUAL SHORTNESS OF BREATH *UNUSUAL BRUISING OR BLEEDING *URINARY PROBLEMS (pain or burning when urinating, or frequent urination) *BOWEL PROBLEMS (unusual diarrhea, constipation, pain near the anus) TENDERNESS IN MOUTH AND THROAT WITH OR WITHOUT PRESENCE OF ULCERS (sore throat, sores in mouth, or a toothache) UNUSUAL RASH, SWELLING OR PAIN  UNUSUAL VAGINAL DISCHARGE OR ITCHING   Items with * indicate a potential emergency and should be followed up as soon as possible or go to the Emergency Department if any problems should occur.  Please show the CHEMOTHERAPY ALERT CARD or IMMUNOTHERAPY ALERT CARD at check-in to the Emergency  Department and triage nurse.  Should you have questions after your visit or need to cancel or reschedule your appointment, please contact [facility] [phone]  and follow the prompts.  Office hours are 8:00 a.m. to 4:30 p.m. Monday - Friday. Please note that voicemails left after 4:00 p.m. may not be returned until the following business day.  We are closed weekends and major holidays. You have access to a nurse at all times for urgent questions. Please call the main number to the clinic [phone] and follow the prompts.  For any non-urgent questions, you may also contact your provider using MyChart. We now offer e-Visits for anyone 65 and older to request care online for non-urgent symptoms. For details visit [email or web address].   Also download the MyChart app! Go to the app store, search "MyChart", open the app, select [facility], and log in with your MyChart username and password.

## 2022-12-25 NOTE — Progress Notes (Signed)
Patient arrived for Hickman dressing change. Dressing changed and caps changed. Patient left in satisfactory condition.

## 2022-12-28 ENCOUNTER — Telehealth: Payer: Medicare HMO | Admitting: Family

## 2022-12-28 DIAGNOSIS — J208 Acute bronchitis due to other specified organisms: Secondary | ICD-10-CM

## 2022-12-28 DIAGNOSIS — B9689 Other specified bacterial agents as the cause of diseases classified elsewhere: Secondary | ICD-10-CM

## 2022-12-28 MED ORDER — DOXYCYCLINE HYCLATE 100 MG PO TABS: 100.0000 mg | ORAL_TABLET | Freq: Two times a day (BID) | ORAL | 0 refills | Status: DC

## 2022-12-28 MED ORDER — BENZONATATE 100 MG PO CAPS: 100.0000 mg | ORAL_CAPSULE | Freq: Three times a day (TID) | ORAL | 0 refills | Status: DC | PRN

## 2022-12-28 NOTE — Patient Instructions (Signed)

## 2022-12-28 NOTE — Progress Notes (Signed)
Virtual Visit Consent   Luis Stewart, you are scheduled for a virtual visit with a [facility] provider today. Just as with appointments in the office, your consent must be obtained to participate. Your consent will be active for this visit and any virtual visit you may have with one of our providers in the next 365 days. If you have a MyChart account, a copy of this consent can be sent to you electronically.  As this is a virtual visit, video technology does not allow for your provider to perform a traditional examination. This may limit your provider's ability to fully assess your condition. If your provider identifies any concerns that need to be evaluated in person or the need to arrange testing (such as labs, EKG, etc.), we will make arrangements to do so. Although advances in technology are sophisticated, we cannot ensure that it will always work on either your end or our end. If the connection with a video visit is poor, the visit may have to be switched to a telephone visit. With either a video or telephone visit, we are not always able to ensure that we have a secure connection.  By engaging in this virtual visit, you consent to the provision of healthcare and authorize for your insurance to be billed (if applicable) for the services provided during this visit. Depending on your insurance coverage, you may receive a charge related to this service.  I need to obtain your verbal consent now. Are you willing to proceed with your visit today? Luis Stewart has provided verbal consent on 12/28/2022 for a virtual visit (video or telephone). Evelina Dun, FNP  Date: 12/28/2022 2:31 PM  Virtual Visit via Video Note   I, Evelina Dun, connected with  Luis Stewart  ([id number], 06-Apr-1951) on 12/28/22 at  3:30 PM EST by a video-enabled telemedicine application and verified that I am speaking with the correct person using two identifiers.  Location: Patient:  Virtual Visit Location Patient: Home Provider: Virtual Visit Location Provider: Home Office   I discussed the limitations of evaluation and management by telemedicine and the availability of in person appointments. The patient expressed understanding and agreed to proceed.    History of Present Illness: Luis Stewart is a 72 y.o. who identifies as a male who was assigned male at birth, and is being seen today for cough and congestion that started 3-4 days ago. 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   HPI: Cough This is a new problem. The current episode started in the past 7 days. The problem has been gradually worsening. The problem occurs every few minutes. The cough is Non-productive. Associated symptoms include nasal congestion, a sore throat and shortness of breath (some times when "I take a deep breath it is tight"). Pertinent negatives include no chills, ear congestion, ear pain, fever, headaches, myalgias or wheezing. He has tried OTC cough suppressant for the symptoms. The treatment provided mild relief.    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   Lumbar radiculopathy 02/28/2021   Closed fracture of left distal fibula 04/18/2020   ED (erectile dysfunction) 05/11/2019   Atypical chest pain 10/25/2018   History of DVT (deep vein thrombosis) 04/16/2018   GERD (gastroesophageal reflux disease) 10/15/2017   Pulmonary nodule 07/19/2017   Constipation 07/15/2017   History  of pulmonary embolism 06/18/2017   Primary localized osteoarthritis of right knee 06/10/2017   Prostate cancer ([place]) 08/17/2015   Exposure to asbestos 08/17/2015   Personal history of colonic polyps 08/08/2014   Vitamin D deficiency 06/05/2014   BPH (benign prostatic hypertrophy) 06/01/2013   Mild  hyperlipidemia 06/01/2013   Encounter for general adult medical examination with abnormal findings 06/01/2013    Allergies:  Allergies  Allergen Reactions   Sulfa Antibiotics Other (See Comments)    Unknown reaction. Childhood reaction   Medications:  Current Outpatient Medications:    benzonatate (TESSALON PERLES) 100 MG capsule, Take 1 capsule (100 mg total) by mouth 3 (three) times daily as needed., Disp: 20 capsule, Rfl: 0   doxycycline (VIBRA-TABS) 100 MG tablet, Take 1 tablet (100 mg total) by mouth 2 (two) times daily., Disp: 20 tablet, Rfl: 0   acetaminophen (TYLENOL) 500 MG tablet, Take 1,000 mg by mouth every 6 (six) hours as needed for mild pain or moderate pain., Disp: , Rfl:    acyclovir (ZOVIRAX) 400 MG tablet, TAKE 1 TABLET BY MOUTH TWICE A DAY, Disp: 180 tablet, Rfl: 2   aspirin EC 81 MG tablet, Take 81 mg by mouth daily. Swallow whole., Disp: , Rfl:    Carfilzomib (KYPROLIS IV), Inject into the vein once a week., Disp: , Rfl:    Cholecalciferol (VITAMIN D) 2000 units CAPS, Take 2,000 Units by mouth every other day., Disp: , Rfl:    daratumumab-hyaluronidase-fihj (DARZALEX FASPRO) 1800-30000 MG-UT/15ML SOLN, Inject 1,800 mg into the skin once a week., Disp: , Rfl:    lenalidomide (REVLIMID) 15 MG capsule, Take 1 capsule (15 mg total) by mouth daily. Take for 21 days, then hold for 7 days. Repeat every 28 days. (Patient not taking: Reported on 12/11/2022), Disp: 21 capsule, Rfl: 0   lidocaine-prilocaine (EMLA) cream, Apply a small amount to port a cath site and cover with plastic wrap (do not rub in) 1 hour prior to infusion appointments, Disp: 30 g, Rfl: 0   methocarbamol (ROBAXIN) 500 MG tablet, Take 1 tablet (500 mg total) by mouth 3 (three) times daily., Disp: 60 tablet, Rfl: 1   Multiple Vitamin (MULTIVITAMIN WITH MINERALS) TABS tablet, Take 1 tablet by mouth daily with breakfast. Centrum Silver for Men, Disp: , Rfl:    nitroGLYCERIN (NITROSTAT) 0.4 MG SL tablet, Place 1  tablet (0.4 mg total) under the tongue every 5 (five) minutes x 3 doses as needed for chest pain (If no relief after 3rd dose, call or go to ED.)., Disp: 30 tablet, Rfl: 0   pantoprazole (PROTONIX) 40 MG tablet, TAKE 1 TABLET BY MOUTH EVERY OTHER DAY, Disp: 45 tablet, Rfl: 1   prochlorperazine (COMPAZINE) 10 MG tablet, Take 1 tablet (10 mg total) by mouth every 6 (six) hours as needed for nausea or vomiting., Disp: 30 tablet, Rfl: 3   tamsulosin (FLOMAX) 0.4 MG CAPS capsule, Take 1 capsule (0.4 mg total) by mouth daily., Disp: 30 capsule, Rfl: 11   traMADol (ULTRAM) 50 MG tablet, Take 1 tablet (50 mg total) by mouth every 6 (six) hours as needed., Disp: 10 tablet, Rfl: 0 No current facility-administered medications for this visit.  Facility-Administered Medications Ordered in Other Visits:    acetaminophen (TYLENOL) 325 MG tablet, , , ,    diphenhydrAMINE (BENADRYL) 25 mg capsule, , , ,   Observations/Objective: Patient is well-developed, well-nourished in no acute distress.  Resting comfortably  at home.  Head is normocephalic, atraumatic.  No labored breathing.  Speech is clear and coherent with logical content.  Patient is alert and oriented at baseline.  Nasal congestion   Assessment and Plan: 1. Acute bacterial bronchitis - doxycycline (VIBRA-TABS) 100 MG tablet; Take 1 tablet (100 mg total) by mouth 2 (two) times daily.  Dispense: 20 tablet; Refill: 0 - benzonatate (TESSALON PERLES) 100 MG capsule; Take 1 capsule (100 mg total) by mouth 3 (three) times daily as needed.  Dispense: 20 capsule; Refill: 0  - Take meds as prescribed - Use a cool mist humidifier  -Use saline nose sprays frequently -Force fluids -For any cough or congestion  Use plain Mucinex- regular strength or max strength is fine -For fever or aces or pains- take tylenol or ibuprofen. -Throat lozenges if help -New toothbrush in 3 days -Follow up if symptoms worsen or do not improve   Follow Up Instructions: I  discussed the assessment and treatment plan with the patient. The patient was provided an opportunity to ask questions and all were answered. The patient agreed with the plan and demonstrated an understanding of the instructions.  A copy of instructions were sent to the patient via MyChart unless otherwise noted below.     The patient was advised to call back or seek an in-person evaluation if the symptoms worsen or if the condition fails to improve as anticipated.  Time:  I spent 9 minutes with the patient via telehealth technology discussing the above problems/concerns.    Evelina Dun, FNP

## 2023-01-01 ENCOUNTER — Inpatient Hospital Stay: Payer: Medicare HMO

## 2023-01-01 NOTE — Progress Notes (Signed)
  Patient arrived arrived for [place] dressing. Dressing and caps changed. Patient left in satisfactory condition.

## 2023-01-08 DIAGNOSIS — C9000 Multiple myeloma not having achieved remission: Secondary | ICD-10-CM | POA: Diagnosis not present

## 2023-01-08 DIAGNOSIS — Z95828 Presence of other vascular implants and grafts: Secondary | ICD-10-CM | POA: Diagnosis not present

## 2023-01-08 DIAGNOSIS — Z52011 Autologous donor, stem cells: Secondary | ICD-10-CM | POA: Diagnosis not present

## 2023-01-09 DIAGNOSIS — Z79899 Other long term (current) drug therapy: Secondary | ICD-10-CM | POA: Diagnosis not present

## 2023-01-09 DIAGNOSIS — Z1159 Encounter for screening for other viral diseases: Secondary | ICD-10-CM | POA: Diagnosis not present

## 2023-01-09 DIAGNOSIS — C9000 Multiple myeloma not having achieved remission: Secondary | ICD-10-CM | POA: Diagnosis not present

## 2023-01-10 DIAGNOSIS — Z52011 Autologous donor, stem cells: Secondary | ICD-10-CM | POA: Diagnosis not present

## 2023-01-10 DIAGNOSIS — Z95828 Presence of other vascular implants and grafts: Secondary | ICD-10-CM | POA: Diagnosis not present

## 2023-01-10 DIAGNOSIS — C9000 Multiple myeloma not having achieved remission: Secondary | ICD-10-CM | POA: Diagnosis not present

## 2023-01-11 DIAGNOSIS — C9000 Multiple myeloma not having achieved remission: Secondary | ICD-10-CM | POA: Diagnosis not present

## 2023-01-12 DIAGNOSIS — Z52011 Autologous donor, stem cells: Secondary | ICD-10-CM | POA: Diagnosis not present

## 2023-01-12 DIAGNOSIS — C9000 Multiple myeloma not having achieved remission: Secondary | ICD-10-CM | POA: Diagnosis not present

## 2023-01-12 DIAGNOSIS — Z79899 Other long term (current) drug therapy: Secondary | ICD-10-CM | POA: Diagnosis not present

## 2023-01-13 DIAGNOSIS — Z52011 Autologous donor, stem cells: Secondary | ICD-10-CM | POA: Diagnosis not present

## 2023-01-13 DIAGNOSIS — Z79899 Other long term (current) drug therapy: Secondary | ICD-10-CM | POA: Diagnosis not present

## 2023-01-14 DIAGNOSIS — Z452 Encounter for adjustment and management of vascular access device: Secondary | ICD-10-CM | POA: Diagnosis not present

## 2023-01-14 DIAGNOSIS — C9000 Multiple myeloma not having achieved remission: Secondary | ICD-10-CM | POA: Diagnosis not present

## 2023-01-14 DIAGNOSIS — Z52011 Autologous donor, stem cells: Secondary | ICD-10-CM | POA: Diagnosis not present

## 2023-01-19 ENCOUNTER — Encounter: Payer: Self-pay | Admitting: Hematology

## 2023-01-19 ENCOUNTER — Inpatient Hospital Stay (HOSPITAL_BASED_OUTPATIENT_CLINIC_OR_DEPARTMENT_OTHER): Payer: Medicare HMO | Admitting: Hematology

## 2023-01-19 VITALS — BP 130/80 | HR 69 | Temp 98.0°F | Resp 20 | Wt 192.7 lb

## 2023-01-19 DIAGNOSIS — C9000 Multiple myeloma not having achieved remission: Secondary | ICD-10-CM | POA: Diagnosis not present

## 2023-01-19 DIAGNOSIS — C61 Malignant neoplasm of prostate: Secondary | ICD-10-CM | POA: Diagnosis not present

## 2023-01-19 DIAGNOSIS — C7951 Secondary malignant neoplasm of bone: Secondary | ICD-10-CM | POA: Diagnosis not present

## 2023-01-19 DIAGNOSIS — M8580 Other specified disorders of bone density and structure, unspecified site: Secondary | ICD-10-CM | POA: Diagnosis not present

## 2023-01-19 DIAGNOSIS — Z79899 Other long term (current) drug therapy: Secondary | ICD-10-CM | POA: Diagnosis not present

## 2023-01-19 DIAGNOSIS — D649 Anemia, unspecified: Secondary | ICD-10-CM | POA: Diagnosis not present

## 2023-01-19 NOTE — Patient Instructions (Signed)
[place] at [facility] Discharge Instructions   You were seen and examined today by Dr. Delton Coombes.  He discussed with your restarting treatment. You will come three weeks in a row with one week off. Resume Revlimid 3 weeks on and one week off.   Return as scheduled.    Thank you for choosing [place] at [facility] to provide your oncology and hematology care.  To afford each patient quality time with our provider, please arrive at least 15 minutes before your scheduled appointment time.   If you have a lab appointment with the [facility] please come in thru the Main Entrance and check in at the main information desk.  You need to re-schedule your appointment should you arrive 10 or more minutes late.  We strive to give you quality time with our providers, and arriving late affects you and other patients whose appointments are after yours.  Also, if you no show three or more times for appointments you may be dismissed from the clinic at the providers discretion.     Again, thank you for choosing [facility].  Our hope is that these requests will decrease the amount of time that you wait before being seen by our physicians.       _____________________________________________________________  Should you have questions after your visit to [facility], please contact our office at [phone] and follow the prompts.  Our office hours are 8:00 a.m. and 4:30 p.m. Monday - Friday.  Please note that voicemails left after 4:00 p.m. may not be returned until the following business day.  We are closed weekends and major holidays.  You do have access to a nurse 24-7, just call the main number to the clinic [phone] and do not press any options, hold on the line and a nurse will answer the phone.    For prescription refill requests, have your pharmacy contact our office and allow 72 hours.    Due to Covid, you will need  to wear a mask upon entering the hospital. If you do not have a mask, a mask will be given to you at the Main Entrance upon arrival. For doctor visits, patients may have 1 support person age 49 or older with them. For treatment visits, patients can not have anyone with them due to social distancing guidelines and our immunocompromised population.

## 2023-01-19 NOTE — Progress Notes (Signed)
[place],  [address]   CLINIC:  Medical Oncology/Hematology  PCP:  Lindell Spar, MD [address] / [place] [address] [phone]   REASON FOR VISIT:  Follow-up for prostate cancer and multiple myeloma  PRIOR THERAPY: none  NGS Results: not done  CURRENT THERAPY: Carfilzomib (20/70) + Daratumumab SQ + Dexamethasone (20/40) DaraKRd q28d  BRIEF ONCOLOGIC HISTORY:  Oncology History  Multiple myeloma ([place])  04/09/2022 Initial Diagnosis   Multiple myeloma ([place])   04/15/2022 - 11/11/2022 Chemotherapy   Patient is on Treatment Plan : MYELOMA RELAPSED/REFRACTORY Carfilzomib (20/70) + Daratumumab SQ + Dexamethasone (20/40) DaraKd q28d       CANCER STAGING:  Cancer Staging  Multiple myeloma ([place]) Staging form: Plasma Cell Myeloma and Plasma Cell Disorders, AJCC 8th Edition - Clinical: No stage assigned - Unsigned   INTERVAL HISTORY:  [name], a 72 y.o. male, seen for follow-up of multiple myeloma and prostate cancer.  He had a stem cell collection done at [facility].  Numbness in the hands and feet is stable.  He was sent back to [place] to restart therapy for multiple myeloma.  REVIEW OF SYSTEMS:  Review of Systems  Neurological:  Positive for numbness (hands and feet tingling).  All other systems reviewed and are negative.   PAST MEDICAL/SURGICAL HISTORY:  Past Medical History:  Diagnosis Date   Arthritis    Asbestosis ([place])    Closed fracture of distal end of fibula with tibia with routine healing 05/28/2020   COVID-19 09/03/2021   Cyst of kidney, acquired    Headache    History of fracture of leg    Right, childhood   Hx of migraine headaches    Hyperlipidemia    Neuropathy    PE (pulmonary thromboembolism) ([place])    After knee surgery   Prostate cancer ([place]) 2015   Adenocarcinoma by biopsy   Past Surgical History:  Procedure Laterality Date   BIOPSY PROSTATE  2003 and 2005    COLONOSCOPY  11/28/2008   Dr. Rourk:internal hemorrhoids/tortus colon/ascending colon polyps, tubular adenomas   COLONOSCOPY N/A 09/04/2014   Procedure: COLONOSCOPY;  Surgeon: Daneil Dolin, MD;  [facility];  Service: Endoscopy;  Laterality: N/A;  9:30   COLONOSCOPY N/A 07/17/2017   Procedure: COLONOSCOPY;  Surgeon: Danie Binder, MD;  [facility];  Service: Endoscopy;  Laterality: N/A;   ESOPHAGOGASTRODUODENOSCOPY N/A 07/16/2017   Procedure: ESOPHAGOGASTRODUODENOSCOPY (EGD);  Surgeon: Danie Binder, MD;  [facility];  Service: Endoscopy;  Laterality: N/A;   GIVENS CAPSULE STUDY  07/16/2017   Procedure: GIVENS CAPSULE STUDY;  Surgeon: Danie Binder, MD;  [facility];  Service: Endoscopy;;   JOINT REPLACEMENT N/A    Phreesia 01/12/2021   POLYPECTOMY  07/17/2017   Procedure: POLYPECTOMY;  Surgeon: Danie Binder, MD;  [facility];  Service: Endoscopy;;  cecal   PORTACATH PLACEMENT Left 04/18/2022   Procedure: INSERTION PORT-A-CATH;  Surgeon: Aviva Signs, MD;  Location: [facility];  Service: General;  Laterality: Left;   TOTAL KNEE ARTHROPLASTY Right 06/10/2017   TOTAL KNEE ARTHROPLASTY Right 06/10/2017   Procedure: TOTAL KNEE ARTHROPLASTY;  Surgeon: [name], MD;  Location: [facility];  Service: Orthopedics;  Laterality: Right;    SOCIAL HISTORY:  Social History   Socioeconomic History   Marital status: Married    Spouse name: Not on file   Number of children:  Not on file   Years of education: Not on file   Highest education level: Not on file  Occupational History   Occupation: Retired  Tobacco Use   Smoking status: Never   Smokeless tobacco: Never  Vaping Use   Vaping Use: Never used  Substance and Sexual Activity   Alcohol use: No   Drug use: No   Sexual activity: Yes  Other Topics Concern   Not on file  Social History Narrative   Not on file   Social Determinants of Health   Financial Resource Strain:  Not on file  Food Insecurity: Not on file  Transportation Needs: No Transportation Needs (02/03/2022)   PRAPARE - Hydrologist (Medical): No    Lack of Transportation (Non-Medical): No  Physical Activity: Sufficiently Active (02/03/2022)   Exercise Vital Sign    Days of Exercise per Week: 4 days    Minutes of Exercise per Session: 60 min  Stress: Not on file  Social Connections: Moderately Integrated (02/03/2022)   Social Connection and Isolation Panel [NHANES]    Frequency of Communication with Friends and Family: More than three times a week    Frequency of Social Gatherings with Friends and Family: More than three times a week    Attends Religious Services: More than 4 times per year    Active Member of [employer] or Organizations: No    Attends Archivist Meetings: Never    Marital Status: Married  Human resources officer Violence: Not At Risk (02/03/2022)   Humiliation, Afraid, Rape, and Kick questionnaire    Fear of Current or Ex-Partner: No    Emotionally Abused: No    Physically Abused: No    Sexually Abused: No    FAMILY HISTORY:  Family History  Problem Relation Age of Onset   Diabetes Mother    Hypertension Mother    Obesity Mother    Heart disease Mother    Mental illness Mother    Hypertension Sister    Obesity Sister    Arthritis Sister    Deep vein thrombosis Father    Dementia Father    Heart disease Father        CABG   Colon cancer Neg Hx     CURRENT MEDICATIONS:  Current Outpatient Medications  Medication Sig Dispense Refill   acetaminophen (TYLENOL) 500 MG tablet Take 1,000 mg by mouth every 6 (six) hours as needed for mild pain or moderate pain.     acyclovir (ZOVIRAX) 400 MG tablet TAKE 1 TABLET BY MOUTH TWICE A DAY 180 tablet 2   aspirin EC 81 MG tablet Take 81 mg by mouth daily. Swallow whole.     benzonatate (TESSALON PERLES) 100 MG capsule Take 1 capsule (100 mg total) by mouth 3 (three) times daily as needed. 20  capsule 0   Carfilzomib (KYPROLIS IV) Inject into the vein once a week.     Cholecalciferol (VITAMIN D) 2000 units CAPS Take 2,000 Units by mouth every other day.     daratumumab-hyaluronidase-fihj (DARZALEX FASPRO) 1800-30000 MG-UT/15ML SOLN Inject 1,800 mg into the skin once a week.     doxycycline (VIBRA-TABS) 100 MG tablet Take 1 tablet (100 mg total) by mouth 2 (two) times daily. 20 tablet 0   lidocaine-prilocaine (EMLA) cream Apply a small amount to port a cath site and cover with plastic wrap (do not rub in) 1 hour prior to infusion appointments 30 g 0   methocarbamol (ROBAXIN) 500 MG tablet Take  1 tablet (500 mg total) by mouth 3 (three) times daily. 60 tablet 1   Multiple Vitamin (MULTIVITAMIN WITH MINERALS) TABS tablet Take 1 tablet by mouth daily with breakfast. Centrum Silver for Men     nitroGLYCERIN (NITROSTAT) 0.4 MG SL tablet Place 1 tablet (0.4 mg total) under the tongue every 5 (five) minutes x 3 doses as needed for chest pain (If no relief after 3rd dose, call or go to ED.). 30 tablet 0   pantoprazole (PROTONIX) 40 MG tablet TAKE 1 TABLET BY MOUTH EVERY OTHER DAY 45 tablet 1   prochlorperazine (COMPAZINE) 10 MG tablet Take 1 tablet (10 mg total) by mouth every 6 (six) hours as needed for nausea or vomiting. 30 tablet 3   tamsulosin (FLOMAX) 0.4 MG CAPS capsule Take 1 capsule (0.4 mg total) by mouth daily. 30 capsule 11   traMADol (ULTRAM) 50 MG tablet Take 1 tablet (50 mg total) by mouth every 6 (six) hours as needed. 10 tablet 0   lenalidomide (REVLIMID) 15 MG capsule Take 1 capsule (15 mg total) by mouth daily. Take for 21 days, then hold for 7 days. Repeat every 28 days. (Patient not taking: Reported on 01/01/2023) 21 capsule 0   No current facility-administered medications for this visit.   Facility-Administered Medications Ordered in Other Visits  Medication Dose Route Frequency Provider Last Rate Last Admin   acetaminophen (TYLENOL) 325 MG tablet            diphenhydrAMINE  (BENADRYL) 25 mg capsule             ALLERGIES:  Allergies  Allergen Reactions   Sulfa Antibiotics Other (See Comments)    Unknown reaction. Childhood reaction    PHYSICAL EXAM:  Performance status (ECOG): 1 - Symptomatic but completely ambulatory  Vitals:   01/19/23 0758  BP: 130/80  Pulse: 69  Resp: 20  Temp: 98 F (36.7 C)  SpO2: 100%    Wt Readings from Last 3 Encounters:  01/19/23 192 lb 10.9 oz (87.4 kg)  11/30/22 192 lb (87.1 kg)  11/25/22 193 lb 3.2 oz (87.6 kg)   Physical Exam Vitals reviewed.  Constitutional:      Appearance: Normal appearance.  Cardiovascular:     Rate and Rhythm: Normal rate and regular rhythm.     Pulses: Normal pulses.     Heart sounds: Normal heart sounds.  Pulmonary:     Effort: Pulmonary effort is normal.     Breath sounds: Normal breath sounds.  Musculoskeletal:     Right lower leg: No edema.     Left lower leg: No edema.  Neurological:     General: No focal deficit present.     Mental Status: He is alert and oriented to person, place, and time.  Psychiatric:        Mood and Affect: Mood normal.        Behavior: Behavior normal.     LABORATORY DATA:  I have reviewed the labs as listed.     Latest Ref Rng & Units 11/30/2022   10:16 AM 11/18/2022   12:22 PM 11/11/2022    7:53 AM  CBC  WBC 4.0 - 10.5 K/uL 16.5  2.9  3.2   Hemoglobin 13.0 - 17.0 g/dL 10.8  10.1  10.3   Hematocrit 39.0 - 52.0 % 32.3  30.0  30.4   Platelets 150 - 400 K/uL 147  142  151       Latest Ref Rng & Units 11/30/2022  10:16 AM 11/18/2022   12:22 PM 11/11/2022    7:53 AM  CMP  Glucose 70 - 99 mg/dL 103  99  90   BUN 8 - 23 mg/dL '11  23  23   '$ Creatinine 0.61 - 1.24 mg/dL 1.05  1.10  1.12   Sodium 135 - 145 mmol/L 143  140  140   Potassium 3.5 - 5.1 mmol/L 3.8  4.0  3.6   Chloride 98 - 111 mmol/L 113  111  112   CO2 22 - 32 mmol/L '24  25  24   '$ Calcium 8.9 - 10.3 mg/dL 8.5  8.1  8.0   Total Protein 6.5 - 8.1 g/dL 5.3  5.5  5.6   Total  Bilirubin 0.3 - 1.2 mg/dL 0.7  0.6  0.7   Alkaline Phos 38 - 126 U/L 56  43  52   AST 15 - 41 U/L '18  18  18   '$ ALT 0 - 44 U/L '19  22  26     '$ DIAGNOSTIC IMAGING:  I have independently reviewed the scans and discussed with the patient. No results found.   ASSESSMENT:  Prostate cancer: - TRUS/Bx on 06/14/2015: 1/12 cores positive for adenocarcinoma-10% of the core in the right lateral base revealed Gleason 3+3=6.  Prostate volume 175 cc.  PSA was 16.6.  PSA density 0.09. - Seen by Dr. Louis Meckel in [facility] for robotic prostatectomy.  Due to low-volume prostate cancer and very low PSA density, it was recommended that he continue active surveillance. - 01/21/2016: Surveillance TRUS/biopsy, following a prostatic MRI which revealed no suspicious prostatic lesions and correlated the patient's large prostate.  All 12 cores were negative for prostate cancer.  Prostate volume was 190 mL. - 08/11/2018: Fusion TRUS/biopsy: Recent prostate MRI-volume 220 mL, no significant lesions.  No evidence of transcapsular/.  Ureteral, SV/bony or lymphatic disease.  Path-1 core (left apex) revealed Gleason 3+3 and 5% of core. - PSA: 17.16 (10/09/2015), 15.4 (11/04/2016), 17.9 (05/15/2017), 18.4 (04/19/2018), 15.9 (02/15/2019), 15.5 (03/20/2020), 22.4 (03/20/2021) - MRI of the lumbar spine with and without contrast on 11/27/2021 done for back pain showed expanded appearance of the sacrum with diffusely abnormal bone marrow signal and contrast-enhancement concerning for metastatic disease. - CT pelvis with contrast on 11/27/2021: Expansile, heterogeneous trabeculated appearance of the sacrum with thickening of the cortex, unchanged from multiple prior exams.  No evidence of pelvic lymphadenopathy.  Extreme prostatomegaly. -PSMA PET scan (03/13/2022): Markedly enlarged prostate gland with focal activity inferior left aspect of the gland suggesting prostatic adenocarcinoma.  No nodal metastasis in the abdomen or pelvis.  Multifocal  skeletal metastasis in the mid sacrum, right sacral ala, subtle lesions in the L2 and several rib lesions. - Degarelix started on 04/15/2022   Social/family history: - Lives at home with his wife.  Retired in 2003 from [employer].  He works part-time work at an [employer].  Non-smoker. - Sister had cholangiocarcinoma.  Paternal first cousin had metastatic cancer.  Paternal uncle had prostate cancer.  3.  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  Core biopsy demonstrates hypercellular marrow (95%) with large atypical plasma cells arranged in sheets, by CD138 IHC plasma cells comprise 70% of the cellular elements, kappa restricted.  Amyloid was negative. 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  Chronic progressive changes involving the sacrum, left iliac bone including accentuation of trabecular markings and cortical thickening with low FDG uptake suggestive of chronic Paget's disease.  Marked prostate gland enlargement. - BMBX (03/20/2022):Hypercellular bone marrow with at least 25% plasma cells on aspirate with atypical features.  Core biopsy demonstrates hypercellular marrow for age (95%) with large atypical plasma cells arranged in sheets.  By CD138 IHC, plasma cells comprise approximately 70% of cellular elements and are kappa restricted.  Amyloid deposition was negative by Congo red.  Karyotype showed 20 q. deletion.  Myeloma FISH panel showed complex abnormalities. 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  Last PSA was 1.42 on 11/18/2022.  2.  IgG kappa multiple myeloma  with complex cytogenetics: - BMBX (11/14/2022): 50% cellularity with plasma cells less than 1%. - PET scan negative for residual disease. - Stem cell collection was completed last week. - He is here to start back on myeloma therapy. - Myeloma labs at [facility] on 01/09/2023: FLC ratio 1.38.  Kappa light chain 3.33.  M spike not detected.  IFE shows possible minute IgG kappa. - He was told to start Revlimid 15 mg 3 weeks on/1 week off.  He will receive Darzalex once a month. - I will reach out to Dr. Feliciana Rossetti and the confirm the treatment plan.  I will send Dara specific immunofixation.  If no evidence of myeloma, will consider maintenance treatment.  3.  Normocytic anemia: - Latest hemoglobin is 10.8.  Will check ferritin and iron panel.  4.  Osteopenia (DEXA scan 06/04/2022: T score -2.1): - Continue monthly denosumab.  Will restart it as his last dose was 10/28/2022.   Orders placed this encounter:  Orders Placed This Encounter  Procedures   Miscellaneous [facility] test (send-out)       [name], MD [place] [phone]

## 2023-01-25 ENCOUNTER — Other Ambulatory Visit: Payer: Self-pay | Admitting: Internal Medicine

## 2023-01-29 ENCOUNTER — Inpatient Hospital Stay: Payer: Medicare HMO

## 2023-01-29 ENCOUNTER — Inpatient Hospital Stay: Payer: Medicare HMO | Attending: Hematology

## 2023-01-29 ENCOUNTER — Other Ambulatory Visit: Payer: Medicare HMO

## 2023-01-29 ENCOUNTER — Ambulatory Visit: Payer: Medicare HMO

## 2023-01-29 VITALS — BP 138/74 | HR 65 | Temp 96.9°F | Resp 16 | Wt 195.2 lb

## 2023-01-29 DIAGNOSIS — C7951 Secondary malignant neoplasm of bone: Secondary | ICD-10-CM | POA: Insufficient documentation

## 2023-01-29 DIAGNOSIS — C9000 Multiple myeloma not having achieved remission: Secondary | ICD-10-CM | POA: Diagnosis not present

## 2023-01-29 DIAGNOSIS — Z5111 Encounter for antineoplastic chemotherapy: Secondary | ICD-10-CM | POA: Insufficient documentation

## 2023-01-29 DIAGNOSIS — Z79899 Other long term (current) drug therapy: Secondary | ICD-10-CM | POA: Insufficient documentation

## 2023-01-29 DIAGNOSIS — C61 Malignant neoplasm of prostate: Secondary | ICD-10-CM | POA: Insufficient documentation

## 2023-01-29 LAB — COMPREHENSIVE METABOLIC PANEL
ALT: 21 U/L (ref 0–44)
AST: 21 U/L (ref 15–41)
Albumin: 3.6 g/dL (ref 3.5–5.0)
Alkaline Phosphatase: 50 U/L (ref 38–126)
Anion gap: 7 (ref 5–15)
BUN: 20 mg/dL (ref 8–23)
CO2: 25 mmol/L (ref 22–32)
Calcium: 8.2 mg/dL — ABNORMAL LOW (ref 8.9–10.3)
Chloride: 109 mmol/L (ref 98–111)
Creatinine, Ser: 1.06 mg/dL (ref 0.61–1.24)
GFR, Estimated: >60 mL/min (ref >60)
Glucose, Bld: 106 mg/dL — ABNORMAL HIGH (ref 70–99)
Potassium: 4 mmol/L (ref 3.5–5.1)
Sodium: 141 mmol/L (ref 135–145)
Total Bilirubin: 0.4 mg/dL (ref 0.3–1.2)
Total Protein: 5.7 g/dL — ABNORMAL LOW (ref 6.5–8.1)

## 2023-01-29 LAB — CBC WITH DIFFERENTIAL/PLATELET
Abs Immature Granulocytes: 0.01 10*3/uL (ref 0.00–0.07)
Basophils Absolute: 0.1 10*3/uL (ref 0.0–0.1)
Basophils Relative: 2 %
Eosinophils Absolute: 0.2 10*3/uL (ref 0.0–0.5)
Eosinophils Relative: 7 %
HCT: 31 % — ABNORMAL LOW (ref 39.0–52.0)
Hemoglobin: 10.3 g/dL — ABNORMAL LOW (ref 13.0–17.0)
Immature Granulocytes: 0 %
Lymphocytes Relative: 8 %
Lymphs Abs: 0.2 10*3/uL — ABNORMAL LOW (ref 0.7–4.0)
MCH: 31.3 pg (ref 26.0–34.0)
MCHC: 33.2 g/dL (ref 30.0–36.0)
MCV: 94.2 fL (ref 80.0–100.0)
Monocytes Absolute: 0.4 10*3/uL (ref 0.1–1.0)
Monocytes Relative: 13 %
Neutro Abs: 2.1 10*3/uL (ref 1.7–7.7)
Neutrophils Relative %: 70 %
Platelets: 170 10*3/uL (ref 150–400)
RBC: 3.29 MIL/uL — ABNORMAL LOW (ref 4.22–5.81)
RDW: 14.9 % (ref 11.5–15.5)
WBC: 3 10*3/uL — ABNORMAL LOW (ref 4.0–10.5)
nRBC: 0 % (ref 0.0–0.2)

## 2023-01-29 LAB — PSA: Prostatic Specific Antigen: 0.8 ng/mL (ref 0.00–4.00)

## 2023-01-29 MED ORDER — SODIUM CHLORIDE 0.9% FLUSH
10.0000 mL | Freq: Once | INTRAVENOUS | Status: AC
  Administered 2023-01-29: 10 mL via INTRAVENOUS

## 2023-01-29 MED ORDER — DEGARELIX ACETATE 80 MG ~~LOC~~ SOLR
80.0000 mg | Freq: Once | SUBCUTANEOUS | Status: AC
  Administered 2023-01-29: 80 mg via SUBCUTANEOUS
  Filled 2023-01-29: qty 4

## 2023-01-29 MED ORDER — DENOSUMAB 120 MG/1.7ML ~~LOC~~ SOLN
120.0000 mg | Freq: Once | SUBCUTANEOUS | Status: AC
  Administered 2023-01-29: 120 mg via SUBCUTANEOUS
  Filled 2023-01-29: qty 1.7

## 2023-01-29 MED ORDER — HEPARIN SOD (PORK) LOCK FLUSH 100 UNIT/ML IV SOLN
500.0000 [IU] | Freq: Once | INTRAVENOUS | Status: AC
  Administered 2023-01-29: 500 [IU] via INTRAVENOUS

## 2023-01-29 NOTE — Progress Notes (Signed)
Treatment given today per MD orders.  Tolerated infusion without adverse affects.  Vital signs stable.  No complaints at this time.  Discharge from clinic ambulatory in stable condition.  Alert and oriented X 3.  Follow up with [facility] as scheduled. 

## 2023-01-29 NOTE — Progress Notes (Signed)
Luis Stewart presents today for [place] injections per the provider's orders.  Patient has been taking Calcium/Vitamin D supplements, has had no prior or upcoming dental work and has had no jaw pain.  Stable during administration without incident; injection site WNL; see MAR for injection details.  Patient tolerated procedure well and without incident.  No questions or complaints noted at this time.

## 2023-01-29 NOTE — Patient Instructions (Signed)
[facility]  Discharge Instructions: Thank you for choosing [facility] to provide your oncology and hematology care.  If you have a lab appointment with the [facility], please come in thru the Main Entrance and check in at the main information desk.  Wear comfortable clothing and clothing appropriate for easy access to any Portacath or PICC line.   We strive to give you quality time with your provider. You may need to reschedule your appointment if you arrive late (15 or more minutes).  Arriving late affects you and other patients whose appointments are after yours.  Also, if you miss three or more appointments without notifying the office, you may be dismissed from the clinic at the provider's discretion.      For prescription refill requests, have your pharmacy contact our office and allow 72 hours for refills to be completed.    Today you received the following chemotherapy and/or immunotherapy agents Xgeva/ [place] Koller      To help prevent nausea and vomiting after your treatment, we encourage you to take your nausea medication as directed.  BELOW ARE SYMPTOMS THAT SHOULD BE REPORTED IMMEDIATELY: *FEVER GREATER THAN 100.4 F (38 C) OR HIGHER *CHILLS OR SWEATING *NAUSEA AND VOMITING THAT IS NOT CONTROLLED WITH YOUR NAUSEA MEDICATION *UNUSUAL SHORTNESS OF BREATH *UNUSUAL BRUISING OR BLEEDING *URINARY PROBLEMS (pain or burning when urinating, or frequent urination) *BOWEL PROBLEMS (unusual diarrhea, constipation, pain near the anus) TENDERNESS IN MOUTH AND THROAT WITH OR WITHOUT PRESENCE OF ULCERS (sore throat, sores in mouth, or a toothache) UNUSUAL RASH, SWELLING OR PAIN  UNUSUAL VAGINAL DISCHARGE OR ITCHING   Items with * indicate a potential emergency and should be followed up as soon as possible or go to the Emergency Department if any problems should occur.  Please show the CHEMOTHERAPY ALERT CARD or IMMUNOTHERAPY ALERT CARD at check-in to the  Emergency Department and triage nurse.  Should you have questions after your visit or need to cancel or reschedule your appointment, please contact [place] [phone]  and follow the prompts.  Office hours are 8:00 a.m. to 4:30 p.m. Monday - Friday. Please note that voicemails left after 4:00 p.m. may not be returned until the following business day.  We are closed weekends and major holidays. You have access to a nurse at all times for urgent questions. Please call the main number to the clinic [phone] and follow the prompts.  For any non-urgent questions, you may also contact your provider using MyChart. We now offer e-Visits for anyone 43 and older to request care online for non-urgent symptoms. For details visit [email or web address].   Also download the MyChart app! Go to the app store, search "MyChart", open the app, select [facility], and log in with your MyChart username and password.

## 2023-02-05 ENCOUNTER — Ambulatory Visit: Payer: Medicare HMO

## 2023-02-05 ENCOUNTER — Other Ambulatory Visit: Payer: Medicare HMO

## 2023-02-09 ENCOUNTER — Ambulatory Visit (INDEPENDENT_AMBULATORY_CARE_PROVIDER_SITE_OTHER): Payer: Medicare HMO

## 2023-02-09 DIAGNOSIS — Z0000 Encounter for general adult medical examination without abnormal findings: Secondary | ICD-10-CM

## 2023-02-09 LAB — MISC LABCORP TEST (SEND OUT): Labcorp test code: 123218

## 2023-02-09 NOTE — Patient Instructions (Signed)
  Luis Stewart , Thank you for taking time to come for your Medicare Wellness Visit. I appreciate your ongoing commitment to your health goals. Please review the following plan we discussed and let me know if I can assist you in the future.   These are the goals we discussed:  Goals       Patient Stated      Patient would like to exercise more       Patient Stated (pt-stated)      None        This is a list of the screening recommended for you and due dates:  Health Maintenance  Topic Date Due   Zoster (Shingles) Vaccine (1 of 2) Never done   COVID-19 Vaccine (3 - Moderna risk series) 11/29/2020   DTaP/Tdap/Td vaccine (2 - Td or Tdap) 02/24/2021   Medicare Annual Wellness Visit  02/10/2024   Colon Cancer Screening  07/18/2027   Pneumonia Vaccine  Completed   Flu Shot  Completed   Hepatitis C Screening: USPSTF Recommendation to screen - Ages 18-79 yo.  Completed   HPV Vaccine  Aged Out

## 2023-02-09 NOTE — Progress Notes (Signed)
Subjective:   Luis Stewart is a 72 y.o. male who presents for Medicare Annual/Subsequent preventive examination.  Review of Systems    I connected with  Luis Stewart on 02/09/23 by a audio enabled telemedicine application and verified that I am speaking with the correct person using two identifiers.  Patient Location: Home  Provider Location: Office/Clinic  I discussed the limitations of evaluation and management by telemedicine. The patient expressed understanding and agreed to proceed.        Objective:    There were no vitals filed for this visit. There is no height or weight on file to calculate BMI.     01/29/2023   11:46 AM 01/19/2023    7:58 AM 01/01/2023    3:34 PM 12/25/2022    3:21 PM 12/11/2022    3:21 PM 11/30/2022    9:14 AM 11/25/2022    9:56 AM  Advanced Directives  Does Patient Have a Medical Advance Directive? No No No No No No No  Would patient like information on creating a medical advance directive? No - Patient declined No - Patient declined No - Patient declined No - Patient declined No - Patient declined No - Patient declined No - Patient declined    Current Medications (verified) Outpatient Encounter Medications as of 02/09/2023  Medication Sig   acetaminophen (TYLENOL) 500 MG tablet Take 1,000 mg by mouth every 6 (six) hours as needed for mild pain or moderate pain.   acyclovir (ZOVIRAX) 400 MG tablet TAKE 1 TABLET BY MOUTH TWICE A DAY   aspirin EC 81 MG tablet Take 81 mg by mouth daily. Swallow whole.   benzonatate (TESSALON PERLES) 100 MG capsule Take 1 capsule (100 mg total) by mouth 3 (three) times daily as needed.   Carfilzomib (KYPROLIS IV) Inject into the vein once a week.   Cholecalciferol (VITAMIN D) 2000 units CAPS Take 2,000 Units by mouth every other day.   daratumumab-hyaluronidase-fihj (DARZALEX FASPRO) 1800-30000 MG-UT/15ML SOLN Inject 1,800 mg into the skin once a week.   doxycycline (VIBRA-TABS) 100 MG  tablet Take 1 tablet (100 mg total) by mouth 2 (two) times daily.   lenalidomide (REVLIMID) 15 MG capsule Take 1 capsule (15 mg total) by mouth daily. Take for 21 days, then hold for 7 days. Repeat every 28 days.   lidocaine-prilocaine (EMLA) cream Apply a small amount to port a cath site and cover with plastic wrap (do not rub in) 1 hour prior to infusion appointments   methocarbamol (ROBAXIN) 500 MG tablet Take 1 tablet (500 mg total) by mouth 3 (three) times daily.   Multiple Vitamin (MULTIVITAMIN WITH MINERALS) TABS tablet Take 1 tablet by mouth daily with breakfast. Centrum Silver for Men   nitroGLYCERIN (NITROSTAT) 0.4 MG SL tablet Place 1 tablet (0.4 mg total) under the tongue every 5 (five) minutes x 3 doses as needed for chest pain (If no relief after 3rd dose, call or go to ED.).   pantoprazole (PROTONIX) 40 MG tablet TAKE 1 TABLET BY MOUTH EVERY OTHER DAY   prochlorperazine (COMPAZINE) 10 MG tablet Take 1 tablet (10 mg total) by mouth every 6 (six) hours as needed for nausea or vomiting.   tamsulosin (FLOMAX) 0.4 MG CAPS capsule Take 1 capsule (0.4 mg total) by mouth daily.   traMADol (ULTRAM) 50 MG tablet Take 1 tablet (50 mg total) by mouth every 6 (six) hours as needed.   Facility-Administered Encounter Medications as of 02/09/2023  Medication   acetaminophen (TYLENOL) 325  MG tablet   diphenhydrAMINE (BENADRYL) 25 mg capsule    Allergies (verified) Sulfa antibiotics   History: Past Medical History:  Diagnosis Date   Arthritis    Asbestosis ([place])    Closed fracture of distal end of fibula with tibia with routine healing 05/28/2020   COVID-19 09/03/2021   Cyst of kidney, acquired    Headache    History of fracture of leg    Right, childhood   Hx of migraine headaches    Hyperlipidemia    Neuropathy    PE (pulmonary thromboembolism) ([place])    After knee surgery   Prostate cancer ([place]) 2015   Adenocarcinoma by biopsy   Past Surgical History:  Procedure Laterality Date    BIOPSY PROSTATE  2003 and 2005   COLONOSCOPY  11/28/2008   Dr. Rourk:internal hemorrhoids/tortus colon/ascending colon polyps, tubular adenomas   COLONOSCOPY N/A 09/04/2014   Procedure: COLONOSCOPY;  Surgeon: Daneil Dolin, MD;  [facility];  Service: Endoscopy;  Laterality: N/A;  9:30   COLONOSCOPY N/A 07/17/2017   Procedure: COLONOSCOPY;  Surgeon: Danie Binder, MD;  [facility];  Service: Endoscopy;  Laterality: N/A;   ESOPHAGOGASTRODUODENOSCOPY N/A 07/16/2017   Procedure: ESOPHAGOGASTRODUODENOSCOPY (EGD);  Surgeon: Danie Binder, MD;  [facility];  Service: Endoscopy;  Laterality: N/A;   GIVENS CAPSULE STUDY  07/16/2017   Procedure: GIVENS CAPSULE STUDY;  Surgeon: Danie Binder, MD;  [facility];  Service: Endoscopy;;   JOINT REPLACEMENT N/A    Phreesia 01/12/2021   POLYPECTOMY  07/17/2017   Procedure: POLYPECTOMY;  Surgeon: Danie Binder, MD;  [facility];  Service: Endoscopy;;  cecal   PORTACATH PLACEMENT Left 04/18/2022   Procedure: INSERTION PORT-A-CATH;  Surgeon: Aviva Signs, MD;  Location: [facility];  Service: General;  Laterality: Left;   TOTAL KNEE ARTHROPLASTY Right 06/10/2017   TOTAL KNEE ARTHROPLASTY Right 06/10/2017   Procedure: TOTAL KNEE ARTHROPLASTY;  Surgeon: [name], MD;  Location: [facility];  Service: Orthopedics;  Laterality: Right;   Family History  Problem Relation Age of Onset   Diabetes Mother    Hypertension Mother    Obesity Mother    Heart disease Mother    Mental illness Mother    Hypertension Sister    Obesity Sister    Arthritis Sister    Deep vein thrombosis Father    Dementia Father    Heart disease Father        CABG   Colon cancer Neg Hx    Social History   Socioeconomic History   Marital status: Married    Spouse name: Not on file   Number of children: Not on file   Years of education: Not on file   Highest education level: Not on file  Occupational History    Occupation: Retired  Tobacco Use   Smoking status: Never   Smokeless tobacco: Never  Vaping Use   Vaping Use: Never used  Substance and Sexual Activity   Alcohol use: No   Drug use: No   Sexual activity: Yes  Other Topics Concern   Not on file  Social History Narrative   Not on file   Social Determinants of Health   Financial Resource Strain: Not on file  Food Insecurity: Not on file  Transportation Needs: No Transportation Needs (02/03/2022)   PRAPARE - Hydrologist (Medical): No    Lack of Transportation (Non-Medical): No  Physical Activity:  Sufficiently Active (02/03/2022)   Exercise Vital Sign    Days of Exercise per Week: 4 days    Minutes of Exercise per Session: 60 min  Stress: Not on file  Social Connections: Moderately Integrated (02/03/2022)   Social Connection and Isolation Panel [NHANES]    Frequency of Communication with Friends and Family: More than three times a week    Frequency of Social Gatherings with Friends and Family: More than three times a week    Attends Religious Services: More than 4 times per year    Active Member of [employer] or Organizations: No    Attends Archivist Meetings: Never    Marital Status: Married    Tobacco Counseling Counseling given: Not Answered   Clinical Intake:    Diabetic?NO   Activities of Daily Living    04/16/2022    2:14 PM 04/16/2022    2:09 PM  In your present state of health, do you have any difficulty performing the following activities:  Hearing?  0  Vision?  0  Difficulty concentrating or making decisions?  0  Walking or climbing stairs?  0  Dressing or bathing?  0  Doing errands, shopping? 0     Patient Care Team: Lindell Spar, MD as PCP - General (Internal Medicine) Satira Sark, MD as PCP - Cardiology (Cardiology) Gala Romney Cristopher Estimable, MD as Consulting Physician (Gastroenterology) [name], MD as Medical Oncologist (Medical Oncology) Brien Mates, RN as Oncology Nurse Navigator (Medical Oncology)  Indicate any recent Medical Services you may have received from other than [facility] providers in the past year (date may be approximate).     Assessment:   This is a routine wellness examination for Rod[place].  Hearing/Vision screen No results found.  Dietary issues and exercise activities discussed:     Goals Addressed   None   Depression Screen    08/05/2022    9:19 AM 02/12/2022    3:03 PM 02/03/2022    3:19 PM 02/03/2022    3:17 PM 10/10/2021   10:12 AM 09/03/2021    8:23 AM 06/06/2021    9:51 AM  PHQ 2/9 Scores  PHQ - 2 Score 0 0 0 0 0 0 0    Fall Risk    08/05/2022    9:18 AM 02/12/2022    3:02 PM 02/03/2022    3:19 PM 10/10/2021   10:12 AM 09/03/2021    8:23 AM  [place] in the past year? 0 0 0 0 0  Number falls in past yr: 0 0 0 0 0  Injury with Fall? 0 0 0 0 0  Risk for fall due to : No Fall Risks No Fall Risks No Fall Risks No Fall Risks No Fall Risks  Follow up Falls evaluation completed Falls evaluation completed Falls evaluation completed Falls evaluation completed Falls evaluation completed    FALL RISK PREVENTION PERTAINING TO THE HOME:  Any stairs in or around the home? No  If so, are there any without handrails? No  Home free of loose throw rugs in walkways, pet beds, electrical cords, etc? Yes  Adequate lighting in your home to reduce risk of falls? Yes   ASSISTIVE DEVICES UTILIZED TO PREVENT FALLS:  Life alert? No  Use of a cane, walker or w/c? No  Grab bars in the bathroom? No  Shower chair or bench in shower? No  Elevated toilet seat or a handicapped toilet? No  02/03/2022    3:20 PM  MMSE - Mini Mental State Exam  Not completed: Unable to complete        02/03/2022    3:20 PM  6CIT Screen  What Year? 0 points  What month? 0 points  What time? 0 points  Count back from 20 0 points  Months in reverse 0 points  Repeat phrase 0 points  Total Score 0 points     Immunizations Immunization History  Administered Date(s) Administered   Fluad Quad(high Dose 65+) 08/18/2019, 09/12/2020, 10/10/2021, 10/08/2022   Influenza, High Dose Seasonal PF 09/29/2015, 11/10/2018   Influenza,inj,Quad PF,6+ Mos 10/15/2016, 09/18/2017   Influenza-Unspecified 02/25/2011, 11/13/2014   Moderna SARS-COV2 Booster Vaccination 11/01/2020   Moderna Sars-Covid-2 Vaccination 01/28/2020, 02/28/2020   Pneumococcal Conjugate-13 11/20/2015   Pneumococcal Polysaccharide-23 10/15/2016   Tdap 02/25/2011   Zoster, Live 05/30/2013    TDAP status: Due, Education has been provided regarding the importance of this vaccine. Advised may receive this vaccine at local pharmacy or Health Dept. Aware to provide a copy of the vaccination record if obtained from local pharmacy or Health Dept. Verbalized acceptance and understanding.  Flu Vaccine status: Up to date  Pneumococcal vaccine status: Up to date  Covid-19 vaccine status: Completed vaccines  Qualifies for Shingles Vaccine? Yes   Zostavax completed No   Shingrix Completed?: No.    Education has been provided regarding the importance of this vaccine. Patient has been advised to call insurance company to determine out of pocket expense if they have not yet received this vaccine. Advised may also receive vaccine at local pharmacy or Health Dept. Verbalized acceptance and understanding.  Screening Tests Health Maintenance  Topic Date Due   Zoster Vaccines- Shingrix (1 of 2) Never done   COVID-19 Vaccine (3 - Moderna risk series) 11/29/2020   DTaP/Tdap/Td (2 - Td or Tdap) 02/24/2021   Medicare Annual Wellness (AWV)  02/10/2024   COLONOSCOPY (Pts 45-1yr Insurance coverage will need to be confirmed)  07/18/2027   Pneumonia Vaccine 72 Years old  Completed   INFLUENZA VACCINE  Completed   Hepatitis C Screening  Completed   HPV VACCINES  Aged Out    Health Maintenance  Health Maintenance Due  Topic Date Due   Zoster  Vaccines- Shingrix (1 of 2) Never done   COVID-19 Vaccine (3 - Moderna risk series) 11/29/2020   DTaP/Tdap/Td (2 - Td or Tdap) 02/24/2021    Colorectal cancer screening: Type of screening: Colonoscopy. Completed 07/17/17. Repeat every 10 years  Lung Cancer Screening: (Low Dose CT Chest recommended if Age 449-80years, 30 pack-year currently smoking OR have quit w/in 15years.) does not qualify.   Lung Cancer Screening Referral: NO  Additional Screening:  Hepatitis C Screening: does qualify; Completed 10/15/16  Vision Screening: Recommended annual ophthalmology exams for early detection of glaucoma and other disorders of the eye. Is the patient up to date with their annual eye exam?  Yes  Who is the provider or what is the name of the office in which the patient attends annual eye exams? My eye doctor rLinna Hoff If pt is not established with a provider, would they like to be referred to a provider to establish care? No .   Dental Screening: Recommended annual dental exams for proper oral hygiene  Community Resource Referral / Chronic Care Management: CRR required this visit?  No   CCM required this visit?  No      Plan:     I have personally reviewed  and noted the following in the patient's chart:   Medical and social history Use of alcohol, tobacco or illicit drugs  Current medications and supplements including opioid prescriptions. Patient is not currently taking opioid prescriptions. Functional ability and status Nutritional status Physical activity Advanced directives List of other physicians Hospitalizations, surgeries, and ER visits in previous 12 months Vitals Screenings to include cognitive, depression, and falls Referrals and appointments  In addition, I have reviewed and discussed with patient certain preventive protocols, quality metrics, and best practice recommendations. A written personalized care plan for preventive services as well as general preventive  health recommendations were provided to patient.     Quentin Angst, [place]   02/09/2023

## 2023-02-12 ENCOUNTER — Other Ambulatory Visit: Payer: Medicare HMO

## 2023-02-12 ENCOUNTER — Ambulatory Visit: Payer: Medicare HMO

## 2023-02-17 ENCOUNTER — Encounter: Payer: Medicare HMO | Admitting: Internal Medicine

## 2023-02-18 ENCOUNTER — Ambulatory Visit (INDEPENDENT_AMBULATORY_CARE_PROVIDER_SITE_OTHER): Payer: Medicare HMO | Admitting: Internal Medicine

## 2023-02-18 ENCOUNTER — Encounter: Payer: Self-pay | Admitting: Internal Medicine

## 2023-02-18 VITALS — BP 128/78 | HR 82 | Ht 74.0 in | Wt 200.2 lb

## 2023-02-18 DIAGNOSIS — C61 Malignant neoplasm of prostate: Secondary | ICD-10-CM | POA: Diagnosis not present

## 2023-02-18 DIAGNOSIS — R7303 Prediabetes: Secondary | ICD-10-CM | POA: Diagnosis not present

## 2023-02-18 DIAGNOSIS — G609 Hereditary and idiopathic neuropathy, unspecified: Secondary | ICD-10-CM | POA: Diagnosis not present

## 2023-02-18 DIAGNOSIS — C9000 Multiple myeloma not having achieved remission: Secondary | ICD-10-CM

## 2023-02-18 DIAGNOSIS — K5900 Constipation, unspecified: Secondary | ICD-10-CM | POA: Diagnosis not present

## 2023-02-18 DIAGNOSIS — K219 Gastro-esophageal reflux disease without esophagitis: Secondary | ICD-10-CM | POA: Diagnosis not present

## 2023-02-18 DIAGNOSIS — Z0001 Encounter for general adult medical examination with abnormal findings: Secondary | ICD-10-CM

## 2023-02-18 LAB — POCT GLYCOSYLATED HEMOGLOBIN (HGB A1C): HbA1c, POC (controlled diabetic range): 5.6 % (ref 0.0–7.0)

## 2023-02-18 MED ORDER — PREGABALIN 50 MG PO CAPS: 50.0000 mg | ORAL_CAPSULE | Freq: Every day | ORAL | 5 refills | Status: DC

## 2023-02-18 NOTE — Assessment & Plan Note (Signed)
Advised to take Colace PRN Maintain adequate hydration

## 2023-02-18 NOTE — Assessment & Plan Note (Signed)
Could be due to lumbar spinal stenosis or MM related pelvic mass or chemotherapy-induced He prefers to avoid Gabapentin, but agrees for Lyrica Started Lyrica 50 mg qHS for now

## 2023-02-18 NOTE — Assessment & Plan Note (Signed)
Has had prostate biopsy Downtrending PSA Follows up with Urologist - under PSA surveillance Did not tolerate Finasteride Followed by Oncology - On delarelix now

## 2023-02-18 NOTE — Assessment & Plan Note (Signed)
Lab Results  Component Value Date   HGBA1C 5.6 02/18/2023   Advised to follow low-carb diet

## 2023-02-18 NOTE — Progress Notes (Addendum)
Established Patient Office Visit  Subjective:  Patient ID: Luis Stewart, male    DOB: Dec 19, 1951  Age: 72 y.o. MRN: [id number]  CC:  Chief Complaint  Patient presents with   Annual Exam    Patient is still having pain in his back toward his right side that radiates down his right leg. He is also having some constipation     HPI Luis Stewart is a 72 y.o. male with past medical history of GERD, chronic low back pain, asbestosis and atypical chest pain who presents for annual physical.  He has been taking Pantoprazole QOD for GERD. Currently denies any nausea, vomiting, dysphagia or odynophagia. C/o chronic constipation, but denies melena or hematochezia. Has tried OTC stool softener with some relief, but takes inconsistently.  Prostate cancer: Followed by urology and oncology.  He is on degarelix for it.  Denies any dysuria or hematuria currently.  Takes tamsulosin for urinary hesitancy.   Multiple myeloma: Followed by Dr. Delton Coombes and Dr. Feliciana Rossetti.  He is on Revlimid and infusion chemotherapy for it. He has had stem cell collection at [facility].  His back pain had improved since starting chemotherapy. Today, he reports low back pain, intermittent, radiating to right LE and unrelated to activity.  He still has mild numbness of the LE.  Denies any recent fall.  Past Medical History:  Diagnosis Date   Arthritis    Asbestosis ([place])    Closed fracture of distal end of fibula with tibia with routine healing 05/28/2020   COVID-19 09/03/2021   Cyst of kidney, acquired    Headache    History of fracture of leg    Right, childhood   Hx of migraine headaches    Hyperlipidemia    Neuropathy    PE (pulmonary thromboembolism) ([place])    After knee surgery   Prostate cancer (Luis Stewart) 2015   Adenocarcinoma by biopsy    Past Surgical History:  Procedure Laterality Date   BIOPSY PROSTATE  2003 and 2005   COLONOSCOPY  11/28/2008   Dr. Rourk:internal  hemorrhoids/tortus colon/ascending colon polyps, tubular adenomas   COLONOSCOPY N/A 09/04/2014   Procedure: COLONOSCOPY;  Surgeon: Daneil Dolin, MD;  [facility];  Service: Endoscopy;  Laterality: N/A;  9:30   COLONOSCOPY N/A 07/17/2017   Procedure: COLONOSCOPY;  Surgeon: Danie Binder, MD;  [facility];  Service: Endoscopy;  Laterality: N/A;   ESOPHAGOGASTRODUODENOSCOPY N/A 07/16/2017   Procedure: ESOPHAGOGASTRODUODENOSCOPY (EGD);  Surgeon: Danie Binder, MD;  [facility];  Service: Endoscopy;  Laterality: N/A;   GIVENS CAPSULE STUDY  07/16/2017   Procedure: GIVENS CAPSULE STUDY;  Surgeon: Danie Binder, MD;  [facility];  Service: Endoscopy;;   JOINT REPLACEMENT N/A    Phreesia 01/12/2021   POLYPECTOMY  07/17/2017   Procedure: POLYPECTOMY;  Surgeon: Danie Binder, MD;  [facility];  Service: Endoscopy;;  cecal   PORTACATH PLACEMENT Left 04/18/2022   Procedure: INSERTION PORT-A-CATH;  Surgeon: Aviva Signs, MD;  Location: [facility];  Service: General;  Laterality: Left;   TOTAL KNEE ARTHROPLASTY Right 06/10/2017   TOTAL KNEE ARTHROPLASTY Right 06/10/2017   Procedure: TOTAL KNEE ARTHROPLASTY;  Surgeon: [name], MD;  Location: [place];  Service: Orthopedics;  Laterality: Right;    Family History  Problem Relation Age of Onset   Diabetes Mother    Hypertension Mother    Obesity Mother    Heart disease Mother    Mental  illness Mother    Hypertension Sister    Obesity Sister    Arthritis Sister    Deep vein thrombosis Father    Dementia Father    Heart disease Father        CABG   Colon cancer Neg Hx     Social History   Socioeconomic History   Marital status: Married    Spouse name: Not on file   Number of children: Not on file   Years of education: Not on file   Highest education level: Not on file  Occupational History   Occupation: Retired  Tobacco Use   Smoking status: Never   Smokeless tobacco:  Never  Vaping Use   Vaping Use: Never used  Substance and Sexual Activity   Alcohol use: No   Drug use: No   Sexual activity: Yes  Other Topics Concern   Not on file  Social History Narrative   Not on file   Social Determinants of Health   Financial Resource Strain: Low Risk  (02/09/2023)   Overall Financial Resource Strain (CARDIA)    Difficulty of Paying Living Expenses: Not hard at all  Food Insecurity: No Food Insecurity (02/09/2023)   Hunger Vital Sign    Worried About Running Out of Food in the Last Year: Never true    [place] in the Last Year: Never true  Transportation Needs: No Transportation Needs (02/09/2023)   PRAPARE - Hydrologist (Medical): No    Lack of Transportation (Non-Medical): No  Physical Activity: Insufficiently Active (02/09/2023)   Exercise Vital Sign    Days of Exercise per Week: 5 days    Minutes of Exercise per Session: 20 min  Stress: No Stress Concern Present (02/09/2023)   [place]    Feeling of Stress : Not at all  Social Connections: [place] (02/09/2023)   Social Connection and Isolation Panel [NHANES]    Frequency of Communication with Friends and Family: More than three times a week    Frequency of Social Gatherings with Friends and Family: More than three times a week    Attends Religious Services: More than 4 times per year    Active Member of [employer] or Organizations: Yes    Attends Music therapist: More than 4 times per year    Marital Status: Married  Human resources officer Violence: Not At Risk (02/09/2023)   Humiliation, Afraid, Rape, and Kick questionnaire    Fear of Current or Ex-Partner: No    Emotionally Abused: No    Physically Abused: No    Sexually Abused: No    Outpatient Medications Prior to Visit  Medication Sig Dispense Refill   acetaminophen (TYLENOL) 500 MG tablet Take 1,000 mg by mouth every 6  (six) hours as needed for mild pain or moderate pain.     acyclovir (ZOVIRAX) 400 MG tablet TAKE 1 TABLET BY MOUTH TWICE A DAY 180 tablet 2   aspirin EC 81 MG tablet Take 81 mg by mouth daily. Swallow whole.     Carfilzomib (KYPROLIS IV) Inject into the vein once a week.     Cholecalciferol (VITAMIN D) 2000 units CAPS Take 2,000 Units by mouth every other day.     daratumumab-hyaluronidase-fihj (DARZALEX FASPRO) 1800-30000 MG-UT/15ML SOLN Inject 1,800 mg into the skin once a week.     lenalidomide (REVLIMID) 15 MG capsule Take 1 capsule (15 mg total) by mouth daily. Take  for 21 days, then hold for 7 days. Repeat every 28 days. 21 capsule 0   lidocaine-prilocaine (EMLA) cream Apply a small amount to port a cath site and cover with plastic wrap (do not rub in) 1 hour prior to infusion appointments 30 g 0   methocarbamol (ROBAXIN) 500 MG tablet Take 1 tablet (500 mg total) by mouth 3 (three) times daily. 60 tablet 1   Multiple Vitamin (MULTIVITAMIN WITH MINERALS) TABS tablet Take 1 tablet by mouth daily with breakfast. Centrum Silver for Men     nitroGLYCERIN (NITROSTAT) 0.4 MG SL tablet Place 1 tablet (0.4 mg total) under the tongue every 5 (five) minutes x 3 doses as needed for chest pain (If no relief after 3rd dose, call or go to ED.). 30 tablet 0   pantoprazole (PROTONIX) 40 MG tablet TAKE 1 TABLET BY MOUTH EVERY OTHER DAY 45 tablet 1   prochlorperazine (COMPAZINE) 10 MG tablet Take 1 tablet (10 mg total) by mouth every 6 (six) hours as needed for nausea or vomiting. 30 tablet 3   tamsulosin (FLOMAX) 0.4 MG CAPS capsule Take 1 capsule (0.4 mg total) by mouth daily. 30 capsule 11   benzonatate (TESSALON PERLES) 100 MG capsule Take 1 capsule (100 mg total) by mouth 3 (three) times daily as needed. 20 capsule 0   doxycycline (VIBRA-TABS) 100 MG tablet Take 1 tablet (100 mg total) by mouth 2 (two) times daily. 20 tablet 0   traMADol (ULTRAM) 50 MG tablet Take 1 tablet (50 mg total) by mouth every 6  (six) hours as needed. 10 tablet 0   acetaminophen (TYLENOL) 325 MG tablet      diphenhydrAMINE (BENADRYL) 25 mg capsule      No facility-administered medications prior to visit.    Allergies  Allergen Reactions   Sulfa Antibiotics Other (See Comments)    Unknown reaction. Childhood reaction    ROS Review of Systems  Constitutional:  Negative for chills and fever.  HENT:  Negative for congestion and sore throat.   Eyes:  Negative for pain and discharge.  Respiratory:  Negative for cough and shortness of breath.   Cardiovascular:  Negative for chest pain and palpitations.  Gastrointestinal:  Positive for constipation. Negative for diarrhea, nausea and vomiting.  Endocrine: Negative for polydipsia and polyuria.  Genitourinary:  Negative for dysuria and hematuria.  Musculoskeletal:  Positive for back pain. Negative for neck pain and neck stiffness.  Skin:  Negative for rash.  Neurological:  Positive for numbness. Negative for dizziness, weakness and headaches.  Psychiatric/Behavioral:  Negative for agitation and behavioral problems.       Objective:    Physical Exam Vitals reviewed.  Constitutional:      General: He is not in acute distress.    Appearance: He is not diaphoretic.  HENT:     Head: Normocephalic and atraumatic.     Nose: Nose normal.     Mouth/Throat:     Mouth: Mucous membranes are moist.  Eyes:     General: No scleral icterus.    Extraocular Movements: Extraocular movements intact.  Cardiovascular:     Rate and Rhythm: Normal rate and regular rhythm.     Pulses: Normal pulses.     Heart sounds: Normal heart sounds. No murmur heard. Pulmonary:     Breath sounds: Normal breath sounds. No wheezing or rales.  Abdominal:     Palpations: Abdomen is soft.     Tenderness: There is no abdominal tenderness.  Musculoskeletal:  Cervical back: Neck supple. No tenderness.     Lumbar back: No tenderness. Decreased range of motion.     Right lower leg: No  edema.     Left lower leg: No edema.  Skin:    General: Skin is warm.     Findings: No rash.  Neurological:     General: No focal deficit present.     Mental Status: He is alert and oriented to person, place, and time.     Sensory: Sensory deficit (B/l feet) present.     Motor: No weakness.  Psychiatric:        Mood and Affect: Mood normal.        Behavior: Behavior normal.     BP 128/78 (BP Location: Right Arm, Patient Position: Sitting, Cuff Size: Large)   Pulse 82   Ht '6\' 2"'$  (1.88 m)   Wt 200 lb 3.2 oz (90.8 kg)   SpO2 97%   BMI 25.70 kg/m  Wt Readings from Last 3 Encounters:  02/18/23 200 lb 3.2 oz (90.8 kg)  01/29/23 195 lb 3.2 oz (88.5 kg)  01/19/23 192 lb 10.9 oz (87.4 kg)    Lab Results  Component Value Date   TSH 4.070 02/06/2022   Lab Results  Component Value Date   WBC 3.0 (L) 01/29/2023   HGB 10.3 (L) 01/29/2023   HCT 31.0 (L) 01/29/2023   MCV 94.2 01/29/2023   PLT 170 01/29/2023   Lab Results  Component Value Date   NA 141 01/29/2023   K 4.0 01/29/2023   CO2 25 01/29/2023   GLUCOSE 106 (H) 01/29/2023   BUN 20 01/29/2023   CREATININE 1.06 01/29/2023   BILITOT 0.4 01/29/2023   ALKPHOS 50 01/29/2023   AST 21 01/29/2023   ALT 21 01/29/2023   PROT 5.7 (L) 01/29/2023   ALBUMIN 3.6 01/29/2023   CALCIUM 8.2 (L) 01/29/2023   ANIONGAP 7 01/29/2023   EGFR 48 (L) 02/06/2022   Lab Results  Component Value Date   CHOL 137 02/06/2022   Lab Results  Component Value Date   HDL 39 (L) 02/06/2022   Lab Results  Component Value Date   LDLCALC 86 02/06/2022   Lab Results  Component Value Date   TRIG 55 02/06/2022   Lab Results  Component Value Date   CHOLHDL 3.5 02/06/2022   Lab Results  Component Value Date   HGBA1C 5.6 02/18/2023      Assessment & Plan:   Encounter for general adult medical examination with abnormal findings Physical exam as documented. Blood tests reviewed from chart. Advised to get Shingrix and Tdap vaccines at  local pharmacy after confirming with Oncologist.  Prostate cancer [facility]) Has had prostate biopsy Downtrending PSA Follows up with Urologist - under PSA surveillance Did not tolerate Finasteride Followed by Oncology - On delarelix now  GERD (gastroesophageal reflux disease) On Pantoprazole 40 mg QOD  Multiple myeloma ([place]) On Revlimid and infusion chemotherapy Followed by Dr. Delton Coombes and Dr. Feliciana Rossetti  Prediabetes Lab Results  Component Value Date   HGBA1C 5.6 02/18/2023   Advised to follow low-carb diet  Idiopathic peripheral neuropathy Could be due to lumbar spinal stenosis or MM related pelvic mass or chemotherapy-induced He prefers to avoid Gabapentin, but agrees for Lyrica Started Lyrica 50 mg qHS for now  Constipation Advised to take Colace PRN Maintain adequate hydration    Meds ordered this encounter  Medications   pregabalin (LYRICA) 50 MG capsule    Sig: Take 1 capsule (50 mg  total) by mouth daily.    Dispense:  30 capsule    Refill:  5    Follow-up: Return in about 6 months (around 08/19/2023) for Peripheral neuropathy and GERD.    Lindell Spar, MD

## 2023-02-18 NOTE — Assessment & Plan Note (Signed)
On Revlimid and infusion chemotherapy Followed by Dr. Delton Coombes and Dr. Feliciana Rossetti

## 2023-02-18 NOTE — Assessment & Plan Note (Signed)
On Pantoprazole 40 mg QOD

## 2023-02-18 NOTE — Assessment & Plan Note (Signed)
Physical exam as documented. Blood tests reviewed from chart. Advised to get Shingrix and Tdap vaccines at local pharmacy after confirming with Oncologist.

## 2023-02-18 NOTE — Patient Instructions (Signed)
Please start taking Lyrica for neuropathy.  Please continue taking other medications as prescribed.  Please continue to follow heart healthy diet and ambulate as tolerated.

## 2023-02-19 ENCOUNTER — Encounter: Payer: Self-pay | Admitting: Radiology

## 2023-02-19 DIAGNOSIS — R69 Illness, unspecified: Secondary | ICD-10-CM | POA: Diagnosis not present

## 2023-02-23 ENCOUNTER — Encounter (HOSPITAL_COMMUNITY): Payer: Self-pay | Admitting: Hematology

## 2023-02-26 ENCOUNTER — Inpatient Hospital Stay: Payer: Medicare HMO

## 2023-02-26 ENCOUNTER — Ambulatory Visit: Payer: Medicare HMO

## 2023-02-26 ENCOUNTER — Inpatient Hospital Stay: Payer: Medicare HMO | Attending: Hematology | Admitting: Hematology

## 2023-02-26 ENCOUNTER — Other Ambulatory Visit: Payer: Medicare HMO

## 2023-02-26 ENCOUNTER — Ambulatory Visit: Payer: Medicare HMO | Admitting: Hematology

## 2023-02-26 DIAGNOSIS — Z79899 Other long term (current) drug therapy: Secondary | ICD-10-CM | POA: Diagnosis not present

## 2023-02-26 DIAGNOSIS — C9000 Multiple myeloma not having achieved remission: Secondary | ICD-10-CM | POA: Insufficient documentation

## 2023-02-26 DIAGNOSIS — Z5111 Encounter for antineoplastic chemotherapy: Secondary | ICD-10-CM | POA: Diagnosis not present

## 2023-02-26 DIAGNOSIS — C61 Malignant neoplasm of prostate: Secondary | ICD-10-CM | POA: Insufficient documentation

## 2023-02-26 DIAGNOSIS — C7951 Secondary malignant neoplasm of bone: Secondary | ICD-10-CM | POA: Insufficient documentation

## 2023-02-26 LAB — CBC WITH DIFFERENTIAL/PLATELET
Abs Immature Granulocytes: 0 10*3/uL (ref 0.00–0.07)
Basophils Absolute: 0 10*3/uL (ref 0.0–0.1)
Basophils Relative: 1 %
Eosinophils Absolute: 0.3 10*3/uL (ref 0.0–0.5)
Eosinophils Relative: 9 %
HCT: 33.2 % — ABNORMAL LOW (ref 39.0–52.0)
Hemoglobin: 11.2 g/dL — ABNORMAL LOW (ref 13.0–17.0)
Immature Granulocytes: 0 %
Lymphocytes Relative: 9 %
Lymphs Abs: 0.3 10*3/uL — ABNORMAL LOW (ref 0.7–4.0)
MCH: 30.4 pg (ref 26.0–34.0)
MCHC: 33.7 g/dL (ref 30.0–36.0)
MCV: 90.2 fL (ref 80.0–100.0)
Monocytes Absolute: 0.4 10*3/uL (ref 0.1–1.0)
Monocytes Relative: 13 %
Neutro Abs: 2 10*3/uL (ref 1.7–7.7)
Neutrophils Relative %: 68 %
Platelets: 206 10*3/uL (ref 150–400)
RBC: 3.68 MIL/uL — ABNORMAL LOW (ref 4.22–5.81)
RDW: 14.1 % (ref 11.5–15.5)
WBC: 3 10*3/uL — ABNORMAL LOW (ref 4.0–10.5)
nRBC: 0 % (ref 0.0–0.2)

## 2023-02-26 LAB — COMPREHENSIVE METABOLIC PANEL
ALT: 21 U/L (ref 0–44)
AST: 22 U/L (ref 15–41)
Albumin: 3.5 g/dL (ref 3.5–5.0)
Alkaline Phosphatase: 41 U/L (ref 38–126)
Anion gap: 7 (ref 5–15)
BUN: 22 mg/dL (ref 8–23)
CO2: 25 mmol/L (ref 22–32)
Calcium: 8.9 mg/dL (ref 8.9–10.3)
Chloride: 107 mmol/L (ref 98–111)
Creatinine, Ser: 1.12 mg/dL (ref 0.61–1.24)
GFR, Estimated: >60 mL/min (ref >60)
Glucose, Bld: 95 mg/dL (ref 70–99)
Potassium: 4 mmol/L (ref 3.5–5.1)
Sodium: 139 mmol/L (ref 135–145)
Total Bilirubin: 0.6 mg/dL (ref 0.3–1.2)
Total Protein: 5.9 g/dL — ABNORMAL LOW (ref 6.5–8.1)

## 2023-02-26 LAB — MAGNESIUM: Magnesium: 1.9 mg/dL (ref 1.7–2.4)

## 2023-02-26 MED ORDER — DENOSUMAB 120 MG/1.7ML ~~LOC~~ SOLN
120.0000 mg | Freq: Once | SUBCUTANEOUS | Status: AC
  Administered 2023-02-26: 120 mg via SUBCUTANEOUS
  Filled 2023-02-26: qty 1.7

## 2023-02-26 MED ORDER — DEGARELIX ACETATE 80 MG ~~LOC~~ SOLR
80.0000 mg | Freq: Once | SUBCUTANEOUS | Status: AC
  Administered 2023-02-26: 80 mg via SUBCUTANEOUS
  Filled 2023-02-26: qty 4

## 2023-02-26 NOTE — Progress Notes (Signed)
Patient tolerated Mills Koller and Xgeva injections with no complaints voiced.  Site clean and dry with no bruising or swelling noted.  No complaints of pain.  Discharged with vital signs stable and no signs or symptoms of distress noted.

## 2023-02-26 NOTE — Progress Notes (Signed)
[place] [address], [place] [address]    Clinic Day:  03/20/2023  Referring physician: Lindell Spar, MD  Patient Care Team: Lindell Spar, MD as PCP - General (Internal Medicine) Satira Sark, MD as PCP - Cardiology (Cardiology) Gala Romney Cristopher Estimable, MD as Consulting Physician (Gastroenterology) [name], MD as Medical Oncologist (Medical Oncology) Brien Mates, RN as Oncology Nurse Navigator (Medical Oncology)   ASSESSMENT & PLAN:   Assessment: Prostate cancer: - TRUS/Bx on 06/14/2015: 1/12 cores positive for adenocarcinoma-10% of the core in the right lateral base revealed Gleason 3+3=6.  Prostate volume 175 cc.  PSA was 16.6.  PSA density 0.09. - Seen by Dr. Louis Meckel in [facility] for robotic prostatectomy.  Due to low-volume prostate cancer and very low PSA density, it was recommended that he continue active surveillance. - 01/21/2016: Surveillance TRUS/biopsy, following a prostatic MRI which revealed no suspicious prostatic lesions and correlated the patient's large prostate.  All 12 cores were negative for prostate cancer.  Prostate volume was 190 mL. - 08/11/2018: Fusion TRUS/biopsy: Recent prostate MRI-volume 220 mL, no significant lesions.  No evidence of transcapsular/.  Ureteral, SV/bony or lymphatic disease.  Path-1 core (left apex) revealed Gleason 3+3 and 5% of core. - PSA: 17.16 (10/09/2015), 15.4 (11/04/2016), 17.9 (05/15/2017), 18.4 (04/19/2018), 15.9 (02/15/2019), 15.5 (03/20/2020), 22.4 (03/20/2021) - MRI of the lumbar spine with and without contrast on 11/27/2021 done for back pain showed expanded appearance of the sacrum with diffusely abnormal bone marrow signal and contrast-enhancement concerning for metastatic disease. - CT pelvis with contrast on 11/27/2021: Expansile, heterogeneous trabeculated appearance of the sacrum with thickening of the cortex, unchanged from multiple prior exams.  No evidence of pelvic lymphadenopathy.   Extreme prostatomegaly. -PSMA PET scan (03/13/2022): Markedly enlarged prostate gland with focal activity inferior left aspect of the gland suggesting prostatic adenocarcinoma.  No nodal metastasis in the abdomen or pelvis.  Multifocal skeletal metastasis in the mid sacrum, right sacral ala, subtle lesions in the L2 and several rib lesions. - Degarelix started on 04/15/2022   Social/family history: - Lives at home with his wife.  Retired in 2003 from [employer].  He works part-time work at an [employer].  Non-smoker. - Sister had cholangiocarcinoma.  Paternal first cousin had metastatic cancer.  Paternal uncle had prostate cancer.  3.  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  Core biopsy demonstrates hypercellular marrow (95%) with large atypical plasma cells arranged in sheets, by CD138 IHC plasma cells comprise 70% of the cellular elements, kappa restricted.  Amyloid was negative. 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  Chronic progressive changes involving the sacrum, left iliac bone including accentuation of trabecular markings and cortical thickening with low FDG uptake suggestive of chronic Paget's disease.  Marked prostate gland enlargement. - BMBX (03/20/2022):Hypercellular bone marrow with at least 25% plasma cells on aspirate with atypical features.  Core biopsy demonstrates hypercellular marrow for age (95%) with large atypical plasma cells arranged in sheets.  By CD138 IHC, plasma cells comprise approximately 70% of cellular elements and are kappa restricted.  Amyloid deposition was negative by Congo red.  Karyotype showed 20 q. deletion.  Myeloma FISH panel  showed complex abnormalities. 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  PSA on 01/29/2023 improved to 0.8 from 1.42.  2.  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  He is not going to proceed with transplant per his wishes. - Myeloma labs at [facility] on 01/09/2023: FLC ratio 1.38.  M spike not detected.  Immunofixation shows possible minute IgG kappa. -DIRA (01/29/2023): IgG kappa protein. - He has received 8 cycles of Kyprolis, daratumumab, Revlimid and dexamethasone. - I have recommended that he restart Revlimid 15 mg 3 weeks on/1 week off. - Will review him in 4 weeks with repeat myeloma labs.  On complete response, will consider further decreasing Revlimid dose to 10 mg maintenance dose.  3.  Normocytic anemia: - Hemoglobin is 11.2 today.  This has improved from last month.  4.  Osteopenia (DEXA scan 06/04/2022: T score -2.1): - Continue monthly denosumab.  Calcium is normal at 8.9.  No orders of the defined types were placed in this encounter.     I,Alexis Herring,acting as a Education administrator for [employer], MD.,have documented all relevant documentation on the behalf of [name], MD,as directed by  [name], MD while in the presence of [name], MD.   I, [name] MD, have reviewed the above documentation for accuracy and completeness, and I agree with the above.   [name], MD   3/29/20243:04 PM  CHIEF COMPLAINT:   Diagnosis: prostate cancer and multiple myeloma    Cancer Staging  Multiple myeloma ([place]) Staging form: Plasma Cell Myeloma and Plasma Cell Disorders, AJCC 8th Edition - Clinical: No stage assigned - Unsigned    Prior Therapy: none  Current Therapy:  Carfilzomib (20/70) + Daratumumab SQ + Dexamethasone (20/40) DaraKRd q28d    HISTORY OF  PRESENT ILLNESS:   Oncology History  Multiple myeloma ([place])  04/09/2022 Initial Diagnosis   Multiple myeloma ([place])   04/15/2022 - 11/11/2022 Chemotherapy   Patient is on Treatment Plan : MYELOMA RELAPSED/REFRACTORY Carfilzomib (20/70) + Daratumumab SQ + Dexamethasone (20/40) DaraKd q28d        INTERVAL HISTORY:   Luis Stewart is a 72 y.o. male presenting to clinic today for follow up of prostate cancer and multiple myeloma. He was last seen by me on 01/19/23.  Today, he states that he is doing well overall. His appetite level is at 90%. His energy level is at 90%. He denies any fever chills night sweats new pains.    PAST MEDICAL HISTORY:   Past Medical History: Past Medical History:  Diagnosis Date   Arthritis    Asbestosis ([place])    Closed fracture of distal end of fibula with tibia with routine healing 05/28/2020   COVID-19 09/03/2021   Cyst of kidney, acquired    Headache    History of fracture of leg    Right, childhood   Hx of migraine headaches    Hyperlipidemia    Neuropathy    PE (pulmonary thromboembolism) ([place])    After knee surgery   Prostate cancer ([place]) 2015   Adenocarcinoma by biopsy    Surgical History: Past Surgical History:  Procedure Laterality Date   BIOPSY PROSTATE  2003 and 2005   COLONOSCOPY  11/28/2008   Dr. Rourk:internal hemorrhoids/tortus colon/ascending colon polyps, tubular adenomas   COLONOSCOPY N/A 09/04/2014   Procedure: COLONOSCOPY;  Surgeon: Daneil Dolin, MD;  [facility];  Service: Endoscopy;  Laterality: N/A;  9:30   COLONOSCOPY N/A 07/17/2017   Procedure: COLONOSCOPY;  Surgeon: Danie Binder, MD;  [facility];  Service: Endoscopy;  Laterality: N/A;   ESOPHAGOGASTRODUODENOSCOPY N/A 07/16/2017   Procedure: ESOPHAGOGASTRODUODENOSCOPY (EGD);  Surgeon: Danie Binder, MD;  [facility];  Service: Endoscopy;  Laterality: N/A;   GIVENS CAPSULE STUDY  07/16/2017   Procedure: GIVENS CAPSULE STUDY;  Surgeon:  Danie Binder, MD;  [facility];  Service: Endoscopy;;   JOINT REPLACEMENT N/A    Phreesia 01/12/2021   POLYPECTOMY  07/17/2017   Procedure: POLYPECTOMY;  Surgeon: Danie Binder, MD;  [facility];  Service: Endoscopy;;  cecal   PORTACATH PLACEMENT Left 04/18/2022   Procedure: INSERTION PORT-A-CATH;  Surgeon: Aviva Signs, MD;  Location: [facility];  Service: General;  Laterality: Left;   TOTAL KNEE ARTHROPLASTY Right 06/10/2017   TOTAL KNEE ARTHROPLASTY Right 06/10/2017   Procedure: TOTAL KNEE ARTHROPLASTY;  Surgeon: [name], MD;  Location: [place];  Service: Orthopedics;  Laterality: Right;    Social History: Social History   Socioeconomic History   Marital status: Married    Spouse name: Not on file   Number of children: Not on file   Years of education: Not on file   Highest education level: Not on file  Occupational History   Occupation: Retired  Tobacco Use   Smoking status: Never   Smokeless tobacco: Never  Vaping Use   Vaping Use: Never used  Substance and Sexual Activity   Alcohol use: No   Drug use: No   Sexual activity: Yes  Other Topics Concern   Not on file  Social History Narrative   Not on file   Social Determinants of Health   Financial Resource Strain: Low Risk  (02/09/2023)   Overall Financial Resource Strain (CARDIA)    Difficulty of Paying Living Expenses: Not hard at all  Food Insecurity: No Food Insecurity (02/09/2023)   Hunger Vital Sign    Worried About Running Out of Food in the Last Year: Never true    [place] in the Last Year: Never true  Transportation Needs: No Transportation Needs (02/09/2023)   PRAPARE - Hydrologist (Medical): No    Lack of Transportation (Non-Medical): No  Physical Activity: Insufficiently Active (02/09/2023)   Exercise Vital Sign    Days of Exercise per Week: 5 days    Minutes of Exercise per Session: 20 min  Stress: No Stress Concern Present (02/09/2023)    [place]    Feeling of Stress : Not at all  Social Connections: [place] (02/09/2023)   Social Connection and Isolation Panel [NHANES]    Frequency of Communication with Friends and Family: More than three times a week    Frequency of Social Gatherings with Friends and Family: More than three times a week    Attends Religious Services: More than 4 times per year    Active Member of [employer] or Organizations: Yes    Attends Archivist Meetings: More than 4 times per year    Marital Status: Married  Human resources officer Violence: Not At Risk (02/09/2023)   Humiliation, Afraid, Rape, and Kick questionnaire    Fear of Current or Ex-Partner: No    Emotionally Abused: No    Physically Abused: No    Sexually Abused: No    Family  History: Family History  Problem Relation Age of Onset   Diabetes Mother    Hypertension Mother    Obesity Mother    Heart disease Mother    Mental illness Mother    Hypertension Sister    Obesity Sister    Arthritis Sister    Deep vein thrombosis Father    Dementia Father    Heart disease Father        CABG   Colon cancer Neg Hx     Current Medications:  Current Outpatient Medications:    acetaminophen (TYLENOL) 500 MG tablet, Take 1,000 mg by mouth every 6 (six) hours as needed for mild pain or moderate pain., Disp: , Rfl:    acyclovir (ZOVIRAX) 400 MG tablet, TAKE 1 TABLET BY MOUTH TWICE A DAY, Disp: 180 tablet, Rfl: 2   aspirin EC 81 MG tablet, Take 81 mg by mouth daily. Swallow whole., Disp: , Rfl:    Carfilzomib (KYPROLIS IV), Inject into the vein once a week., Disp: , Rfl:    Cholecalciferol (VITAMIN D) 2000 units CAPS, Take 2,000 Units by mouth every other day., Disp: , Rfl:    daratumumab-hyaluronidase-fihj (DARZALEX FASPRO) 1800-30000 MG-UT/15ML SOLN, Inject 1,800 mg into the skin once a week., Disp: , Rfl:    lenalidomide (REVLIMID) 15 MG capsule, Take 1 capsule  (15 mg total) by mouth daily. Take for 21 days, then hold for 7 days. Repeat every 28 days. (Patient not taking: Reported on 03/19/2023), Disp: 21 capsule, Rfl: 0   lidocaine-prilocaine (EMLA) cream, Apply a small amount to port a cath site and cover with plastic wrap (do not rub in) 1 hour prior to infusion appointments, Disp: 30 g, Rfl: 0   methocarbamol (ROBAXIN) 500 MG tablet, Take 1 tablet (500 mg total) by mouth 3 (three) times daily., Disp: 60 tablet, Rfl: 1   Multiple Vitamin (MULTIVITAMIN WITH MINERALS) TABS tablet, Take 1 tablet by mouth daily with breakfast. Centrum Silver for Men, Disp: , Rfl:    pantoprazole (PROTONIX) 40 MG tablet, TAKE 1 TABLET BY MOUTH EVERY OTHER DAY, Disp: 45 tablet, Rfl: 1   pregabalin (LYRICA) 50 MG capsule, Take 1 capsule (50 mg total) by mouth daily., Disp: 30 capsule, Rfl: 5   prochlorperazine (COMPAZINE) 10 MG tablet, Take 1 tablet (10 mg total) by mouth every 6 (six) hours as needed for nausea or vomiting., Disp: 30 tablet, Rfl: 3   tamsulosin (FLOMAX) 0.4 MG CAPS capsule, Take 1 capsule (0.4 mg total) by mouth daily., Disp: 30 capsule, Rfl: 11   hydrOXYzine (ATARAX) 10 MG tablet, TAKE 1 TABLET BY MOUTH EVERY 6 HOURS AS NEEDED FOR ITCHING, Disp: 360 tablet, Rfl: 1   nitroGLYCERIN (NITROSTAT) 0.4 MG SL tablet, Place 1 tablet (0.4 mg total) under the tongue every 5 (five) minutes x 3 doses as needed for chest pain (If no relief after 3rd dose, call or go to ED.). (Patient not taking: Reported on 02/26/2023), Disp: 30 tablet, Rfl: 0   Allergies: Allergies  Allergen Reactions   Sulfa Antibiotics Other (See Comments)    Unknown reaction. Childhood reaction    REVIEW OF SYSTEMS:   Review of Systems  Constitutional:  Negative for chills, fatigue and fever.  HENT:   Negative for lump/mass, mouth sores, nosebleeds, sore throat and trouble swallowing.   Eyes:  Negative for eye problems.  Respiratory:  Negative for cough and shortness of breath.   Cardiovascular:   Negative for chest pain, leg swelling and palpitations.  Gastrointestinal:  Negative  for abdominal pain, constipation, diarrhea, nausea and vomiting.  Genitourinary:  Negative for bladder incontinence, difficulty urinating, dysuria, frequency, hematuria and nocturia.   Musculoskeletal:  Negative for arthralgias, back pain, flank pain, myalgias and neck pain.  Skin:  Negative for itching and rash.  Neurological:  Negative for dizziness, headaches and numbness.  Hematological:  Does not bruise/bleed easily.  Psychiatric/Behavioral:  Negative for depression, sleep disturbance and suicidal ideas. The patient is not nervous/anxious.   All other systems reviewed and are negative.    VITALS:   There were no vitals taken for this visit.  Wt Readings from Last 3 Encounters:  02/26/23 197 lb 9.6 oz (89.6 kg)  02/18/23 200 lb 3.2 oz (90.8 kg)  01/29/23 195 lb 3.2 oz (88.5 kg)    There is no height or weight on file to calculate BMI.  Performance status (ECOG): 1 - Symptomatic but completely ambulatory  PHYSICAL EXAM:   Physical Exam Vitals and nursing note reviewed. Exam conducted with a chaperone present.  Constitutional:      Appearance: Normal appearance.  Cardiovascular:     Rate and Rhythm: Normal rate and regular rhythm.     Pulses: Normal pulses.     Heart sounds: Normal heart sounds.  Pulmonary:     Effort: Pulmonary effort is normal.     Breath sounds: Normal breath sounds.  Abdominal:     Palpations: Abdomen is soft. There is no hepatomegaly, splenomegaly or mass.     Tenderness: There is no abdominal tenderness.  Musculoskeletal:     Right lower leg: No edema.     Left lower leg: No edema.  Lymphadenopathy:     Cervical: No cervical adenopathy.     Right cervical: No superficial, deep or posterior cervical adenopathy.    Left cervical: No superficial, deep or posterior cervical adenopathy.     Upper Body:     Right upper body: No supraclavicular or axillary adenopathy.      Left upper body: No supraclavicular or axillary adenopathy.  Neurological:     General: No focal deficit present.     Mental Status: He is alert and oriented to person, place, and time.  Psychiatric:        Mood and Affect: Mood normal.        Behavior: Behavior normal.     LABS:      Latest Ref Rng & Units 03/19/2023   10:30 AM 02/26/2023   11:13 AM 01/29/2023   11:00 AM  CBC  WBC 4.0 - 10.5 K/uL 2.8  3.0  3.0   Hemoglobin 13.0 - 17.0 g/dL 10.7  11.2  10.3   Hematocrit 39.0 - 52.0 % 31.9  33.2  31.0   Platelets 150 - 400 K/uL 194  206  170       Latest Ref Rng & Units 03/19/2023   10:30 AM 02/26/2023   11:13 AM 01/29/2023   11:00 AM  CMP  Glucose 70 - 99 mg/dL 95  95  106   BUN 8 - 23 mg/dL 19  22  20    Creatinine 0.61 - 1.24 mg/dL 1.02  1.12  1.06   Sodium 135 - 145 mmol/L 140  139  141   Potassium 3.5 - 5.1 mmol/L 3.7  4.0  4.0   Chloride 98 - 111 mmol/L 108  107  109   CO2 22 - 32 mmol/L 25  25  25    Calcium 8.9 - 10.3 mg/dL 8.8  8.9  8.2  Total Protein 6.5 - 8.1 g/dL 5.8  5.9  5.7   Total Bilirubin 0.3 - 1.2 mg/dL 0.6  0.6  0.4   Alkaline Phos 38 - 126 U/L 35  41  50   AST 15 - 41 U/L 22  22  21    ALT 0 - 44 U/L 24  21  21       No results found for: "CEA1", "CEA" / No results found for: "CEA1", "CEA" Lab Results  Component Value Date   PSA1 22.5 (H) 11/05/2021   No results found for: "[id number]" No results found for: "CAN125"  Lab Results  Component Value Date   TOTALPROTELP 5.3 (L) 11/18/2022   ALBUMINELP 3.5 11/18/2022   A1GS 0.2 11/18/2022   A2GS 0.5 11/18/2022   BETS 0.8 11/18/2022   GAMS 0.2 (L) 11/18/2022   MSPIKE 0.1 (H) 11/18/2022   SPEI Comment 11/18/2022   Lab Results  Component Value Date   TIBC 301 02/26/2022   TIBC 262 07/15/2017   FERRITIN 168 02/26/2022   FERRITIN 145 07/15/2017   IRONPCTSAT 42 (H) 02/26/2022   IRONPCTSAT 15 (L) 07/15/2017   Lab Results  Component Value Date   LDH 142 10/21/2022   LDH 131 08/26/2022   LDH 124  07/29/2022     STUDIES:   No results found.

## 2023-02-26 NOTE — Patient Instructions (Addendum)
[place] at [facility] Discharge Instructions   You were seen and examined today by Dr. Delton Coombes.  He reviewed the results of your lab work from 01/29/2023. Your M-spike is not observed, but on the immunofixation, there was a trace amount of the protein seen.   Dr. Raliegh Ip would like for you to restart Revlimid as prescribed, 3 weeks on and 1 week off.   We will proceed with your Mills Koller and Xgeva injections today.   Return as scheduled.    Thank you for choosing [place] at [facility] to provide your oncology and hematology care.  To afford each patient quality time with our provider, please arrive at least 15 minutes before your scheduled appointment time.   If you have a lab appointment with the [facility] please come in thru the Main Entrance and check in at the main information desk.  You need to re-schedule your appointment should you arrive 10 or more minutes late.  We strive to give you quality time with our providers, and arriving late affects you and other patients whose appointments are after yours.  Also, if you no show three or more times for appointments you may be dismissed from the clinic at the providers discretion.     Again, thank you for choosing [facility].  Our hope is that these requests will decrease the amount of time that you wait before being seen by our physicians.       _____________________________________________________________  Should you have questions after your visit to [facility], please contact our office at [phone] and follow the prompts.  Our office hours are 8:00 a.m. and 4:30 p.m. Monday - Friday.  Please note that voicemails left after 4:00 p.m. may not be returned until the following business day.  We are closed weekends and major holidays.  You do have access to a nurse 24-7, just call the main number to the clinic [phone] and do not press any options, hold on  the line and a nurse will answer the phone.    For prescription refill requests, have your pharmacy contact our office and allow 72 hours.    Due to Covid, you will need to wear a mask upon entering the hospital. If you do not have a mask, a mask will be given to you at the Main Entrance upon arrival. For doctor visits, patients may have 1 support person age 31 or older with them. For treatment visits, patients can not have anyone with them due to social distancing guidelines and our immunocompromised population.

## 2023-02-26 NOTE — Patient Instructions (Signed)
[facility]  Discharge Instructions: Thank you for choosing [facility] to provide your oncology and hematology care.  If you have a lab appointment with the [facility], please come in thru the Main Entrance and check in at the main information desk.  Wear comfortable clothing and clothing appropriate for easy access to any Portacath or PICC line.   We strive to give you quality time with your provider. You may need to reschedule your appointment if you arrive late (15 or more minutes).  Arriving late affects you and other patients whose appointments are after yours.  Also, if you miss three or more appointments without notifying the office, you may be dismissed from the clinic at the provider's discretion.      For prescription refill requests, have your pharmacy contact our office and allow 72 hours for refills to be completed.    Today you received the following chemotherapy and/or immunotherapy agents [facility].  Degarelix Injection What is this medication? DEGARELIX (deg a REL ix) treats prostate cancer. It works by decreasing levels of the hormone testosterone in the body. This prevents prostate cancer cells from spreading or growing. It belongs to a group of medications called GnRH blockers. This medicine may be used for other purposes; ask your health care provider or pharmacist if you have questions. COMMON BRAND NAME(S): Degarelix, Mills Koller What should I tell my care team before I take this medication? They need to know if you have any of these conditions: Diabetes Heart disease Kidney disease Liver disease Low levels of potassium or magnesium in the blood Osteoporosis, weak bones An unusual or allergic reaction to degarelix, mannitol, other medications, foods, dyes, or preservatives If you or your partner are pregnant or trying to get pregnant How should I use this medication? This medication is injected under the skin. It is usually given  by your care team in a hospital or clinic setting. It may also be given at home. If you get this medication at home, you will be taught how to prepare and give it. Use exactly as directed. Take it as directed on the prescription label at the same time every day. Keep taking it unless your care team tells you to stop. It is important that you put your used needles and syringes in a special sharps container. Do not put them in a trash can. If you do not have a sharps container, call your pharmacist or care team to get one. Talk to your care team about the use of this medication in children. Special care may be needed. Overdosage: If you think you have taken too much of this medicine contact a poison control center or emergency room at once. NOTE: This medicine is only for you. Do not share this medicine with others. What if I miss a dose? If you get this medication at the hospital or clinic: It is important not to miss your dose. Call your care team if you are unable to keep an appointment. If you give yourself this medication at home: If you miss a dose, take it as soon as you can. If it is almost time for your next dose, take only that dose. Do not take double or extra doses. Call your care team with questions. What may interact with this medication? Do not take this medication with any of the following: Cisapride Dronedarone Pimozide Thioridazine This medication may also interact with the following: Other medications that cause heart rhythm changes This list may  not describe all possible interactions. Give your health care provider a list of all the medicines, herbs, non-prescription drugs, or dietary supplements you use. Also tell them if you smoke, drink alcohol, or use illegal drugs. Some items may interact with your medicine. What should I watch for while using this medication? Your condition will be monitored carefully while you are receiving this medication. You may need blood work while  taking this medication. Do not rub or scratch injection site. There may be a lump at the injection site, or it may be red or sore for a few days after your dose. This medication may cause infertility. Talk to your care team if you are concerned about your fertility. What side effects may I notice from receiving this medication? Side effects that you should report to your care team as soon as possible: Allergic reactions or angioedema--skin rash, itching or hives, swelling of the face, eyes, lips, tongue, arms, or legs, trouble swallowing or breathing Heart rhythm changes--fast or irregular heartbeat, dizziness, feeling faint or lightheaded, chest pain, trouble breathing Side effects that usually do not require medical attention (report to your care team if they continue or are bothersome): Hot flashes Pain, redness, or irritation at injection site Weight gain This list may not describe all possible side effects. Call your doctor for medical advice about side effects. You may report side effects to FDA at 1-800-FDA-1088. Where should I keep my medication? Keep out of the reach of children and pets. This medication is usually given in a hospital or clinic and will not be stored at home. In rare cases, this medication may be given at home. If you are using this medication at home, you will be instructed on how to store this medication. Get rid of any unused medication after the expiration date. To get rid of medications that are no longer needed or have expired: Take the medication to a medication take-back program. Check with your pharmacy or law enforcement to find a location. If you cannot return the medication, ask your pharmacist or care team how to get rid of this medication safely. NOTE: This sheet is a summary. It may not cover all possible information. If you have questions about this medicine, talk to your doctor, pharmacist, or health care provider.  2023 Elsevier/Gold Standard  (2022-04-30 00:00:00)        To help prevent nausea and vomiting after your treatment, we encourage you to take your nausea medication as directed.  BELOW ARE SYMPTOMS THAT SHOULD BE REPORTED IMMEDIATELY: *FEVER GREATER THAN 100.4 F (38 C) OR HIGHER *CHILLS OR SWEATING *NAUSEA AND VOMITING THAT IS NOT CONTROLLED WITH YOUR NAUSEA MEDICATION *UNUSUAL SHORTNESS OF BREATH *UNUSUAL BRUISING OR BLEEDING *URINARY PROBLEMS (pain or burning when urinating, or frequent urination) *BOWEL PROBLEMS (unusual diarrhea, constipation, pain near the anus) TENDERNESS IN MOUTH AND THROAT WITH OR WITHOUT PRESENCE OF ULCERS (sore throat, sores in mouth, or a toothache) UNUSUAL RASH, SWELLING OR PAIN  UNUSUAL VAGINAL DISCHARGE OR ITCHING   Items with * indicate a potential emergency and should be followed up as soon as possible or go to the Emergency Department if any problems should occur.  Please show the CHEMOTHERAPY ALERT CARD or IMMUNOTHERAPY ALERT CARD at check-in to the Emergency Department and triage nurse.  Should you have questions after your visit or need to cancel or reschedule your appointment, please contact [facility] [phone]  and follow the prompts.  Office hours are 8:00 a.m. to 4:30 p.m.  Monday - Friday. Please note that voicemails left after 4:00 p.m. may not be returned until the following business day.  We are closed weekends and major holidays. You have access to a nurse at all times for urgent questions. Please call the main number to the clinic [phone] and follow the prompts.  For any non-urgent questions, you may also contact your provider using MyChart. We now offer e-Visits for anyone 42 and older to request care online for non-urgent symptoms. For details visit [email or web address].   Also download the MyChart app! Go to the app store, search "MyChart", open the app, select [facility], and log in with your MyChart username and password.

## 2023-03-02 ENCOUNTER — Other Ambulatory Visit: Payer: Self-pay | Admitting: *Deleted

## 2023-03-02 MED ORDER — HYDROXYZINE HCL 10 MG PO TABS: 10.0000 mg | ORAL_TABLET | Freq: Four times a day (QID) | ORAL | 1 refills | Status: DC | PRN

## 2023-03-02 NOTE — Progress Notes (Signed)
Patient called to advise full body itching since injections and restarting Revlimid on 3/7.  Has taken benadryl with no relief.  Per Dr. Delton Coombes, will hold Revlimid while itching and start on Atarax 10 mg Q 6 prn.  Patient aware and verbalized understanding.

## 2023-03-05 ENCOUNTER — Ambulatory Visit: Payer: Medicare HMO

## 2023-03-05 ENCOUNTER — Other Ambulatory Visit: Payer: Medicare HMO

## 2023-03-09 ENCOUNTER — Other Ambulatory Visit: Payer: Self-pay | Admitting: Hematology

## 2023-03-12 ENCOUNTER — Other Ambulatory Visit: Payer: Medicare HMO

## 2023-03-12 ENCOUNTER — Ambulatory Visit: Payer: Medicare HMO

## 2023-03-19 ENCOUNTER — Inpatient Hospital Stay: Payer: Medicare HMO

## 2023-03-19 VITALS — BP 127/78 | HR 68 | Temp 97.6°F | Resp 18

## 2023-03-19 DIAGNOSIS — Z5111 Encounter for antineoplastic chemotherapy: Secondary | ICD-10-CM | POA: Diagnosis not present

## 2023-03-19 DIAGNOSIS — Z79899 Other long term (current) drug therapy: Secondary | ICD-10-CM | POA: Diagnosis not present

## 2023-03-19 DIAGNOSIS — Z95828 Presence of other vascular implants and grafts: Secondary | ICD-10-CM

## 2023-03-19 DIAGNOSIS — C7951 Secondary malignant neoplasm of bone: Secondary | ICD-10-CM | POA: Diagnosis not present

## 2023-03-19 DIAGNOSIS — C61 Malignant neoplasm of prostate: Secondary | ICD-10-CM

## 2023-03-19 DIAGNOSIS — C9000 Multiple myeloma not having achieved remission: Secondary | ICD-10-CM | POA: Diagnosis not present

## 2023-03-19 LAB — COMPREHENSIVE METABOLIC PANEL
ALT: 24 U/L (ref 0–44)
AST: 22 U/L (ref 15–41)
Albumin: 3.4 g/dL — ABNORMAL LOW (ref 3.5–5.0)
Alkaline Phosphatase: 35 U/L — ABNORMAL LOW (ref 38–126)
Anion gap: 7 (ref 5–15)
BUN: 19 mg/dL (ref 8–23)
CO2: 25 mmol/L (ref 22–32)
Calcium: 8.8 mg/dL — ABNORMAL LOW (ref 8.9–10.3)
Chloride: 108 mmol/L (ref 98–111)
Creatinine, Ser: 1.02 mg/dL (ref 0.61–1.24)
GFR, Estimated: >60 mL/min (ref >60)
Glucose, Bld: 95 mg/dL (ref 70–99)
Potassium: 3.7 mmol/L (ref 3.5–5.1)
Sodium: 140 mmol/L (ref 135–145)
Total Bilirubin: 0.6 mg/dL (ref 0.3–1.2)
Total Protein: 5.8 g/dL — ABNORMAL LOW (ref 6.5–8.1)

## 2023-03-19 LAB — CBC WITH DIFFERENTIAL/PLATELET
Abs Immature Granulocytes: 0.01 10*3/uL (ref 0.00–0.07)
Basophils Absolute: 0 10*3/uL (ref 0.0–0.1)
Basophils Relative: 2 %
Eosinophils Absolute: 0.2 10*3/uL (ref 0.0–0.5)
Eosinophils Relative: 8 %
HCT: 31.9 % — ABNORMAL LOW (ref 39.0–52.0)
Hemoglobin: 10.7 g/dL — ABNORMAL LOW (ref 13.0–17.0)
Immature Granulocytes: 0 %
Lymphocytes Relative: 14 %
Lymphs Abs: 0.4 10*3/uL — ABNORMAL LOW (ref 0.7–4.0)
MCH: 29.4 pg (ref 26.0–34.0)
MCHC: 33.5 g/dL (ref 30.0–36.0)
MCV: 87.6 fL (ref 80.0–100.0)
Monocytes Absolute: 0.3 10*3/uL (ref 0.1–1.0)
Monocytes Relative: 10 %
Neutro Abs: 1.8 10*3/uL (ref 1.7–7.7)
Neutrophils Relative %: 66 %
Platelets: 194 10*3/uL (ref 150–400)
RBC: 3.64 MIL/uL — ABNORMAL LOW (ref 4.22–5.81)
RDW: 14.6 % (ref 11.5–15.5)
WBC: 2.8 10*3/uL — ABNORMAL LOW (ref 4.0–10.5)
nRBC: 0 % (ref 0.0–0.2)

## 2023-03-19 LAB — PSA: Prostatic Specific Antigen: 0.88 ng/mL (ref 0.00–4.00)

## 2023-03-19 LAB — MAGNESIUM: Magnesium: 2 mg/dL (ref 1.7–2.4)

## 2023-03-19 MED ORDER — HEPARIN SOD (PORK) LOCK FLUSH 100 UNIT/ML IV SOLN
500.0000 [IU] | Freq: Once | INTRAVENOUS | Status: AC
  Administered 2023-03-19: 500 [IU] via INTRAVENOUS

## 2023-03-19 MED ORDER — SODIUM CHLORIDE 0.9% FLUSH
10.0000 mL | Freq: Once | INTRAVENOUS | Status: AC
  Administered 2023-03-19: 10 mL via INTRAVENOUS

## 2023-03-19 NOTE — Progress Notes (Signed)
Port flushed with good blood return noted. No bruising or swelling at site. Bandaid applied and patient discharged in satisfactory condition. VVS stable with no signs or symptoms of distressed noted. 

## 2023-03-19 NOTE — Patient Instructions (Signed)
[facility]  Discharge Instructions: Thank you for choosing [facility] to provide your oncology and hematology care.  If you have a lab appointment with the [facility], please come in thru the Main Entrance and check in at the main information desk.  Wear comfortable clothing and clothing appropriate for easy access to any Portacath or PICC line.   We strive to give you quality time with your provider. You may need to reschedule your appointment if you arrive late (15 or more minutes).  Arriving late affects you and other patients whose appointments are after yours.  Also, if you miss three or more appointments without notifying the office, you may be dismissed from the clinic at the provider's discretion.      For prescription refill requests, have your pharmacy contact our office and allow 72 hours for refills to be completed.    Today you received the following port flush, labs drawn, return as scheduled.   To help prevent nausea and vomiting after your treatment, we encourage you to take your nausea medication as directed.  BELOW ARE SYMPTOMS THAT SHOULD BE REPORTED IMMEDIATELY: *FEVER GREATER THAN 100.4 F (38 C) OR HIGHER *CHILLS OR SWEATING *NAUSEA AND VOMITING THAT IS NOT CONTROLLED WITH YOUR NAUSEA MEDICATION *UNUSUAL SHORTNESS OF BREATH *UNUSUAL BRUISING OR BLEEDING *URINARY PROBLEMS (pain or burning when urinating, or frequent urination) *BOWEL PROBLEMS (unusual diarrhea, constipation, pain near the anus) TENDERNESS IN MOUTH AND THROAT WITH OR WITHOUT PRESENCE OF ULCERS (sore throat, sores in mouth, or a toothache) UNUSUAL RASH, SWELLING OR PAIN  UNUSUAL VAGINAL DISCHARGE OR ITCHING   Items with * indicate a potential emergency and should be followed up as soon as possible or go to the Emergency Department if any problems should occur.  Please show the CHEMOTHERAPY ALERT CARD or IMMUNOTHERAPY ALERT CARD at check-in to the Emergency  Department and triage nurse.  Should you have questions after your visit or need to cancel or reschedule your appointment, please contact [facility] [phone]  and follow the prompts.  Office hours are 8:00 a.m. to 4:30 p.m. Monday - Friday. Please note that voicemails left after 4:00 p.m. may not be returned until the following business day.  We are closed weekends and major holidays. You have access to a nurse at all times for urgent questions. Please call the main number to the clinic [phone] and follow the prompts.  For any non-urgent questions, you may also contact your provider using MyChart. We now offer e-Visits for anyone 29 and older to request care online for non-urgent symptoms. For details visit [email or web address].   Also download the MyChart app! Go to the app store, search "MyChart", open the app, select [facility], and log in with your MyChart username and password.

## 2023-03-20 ENCOUNTER — Encounter (HOSPITAL_COMMUNITY): Payer: Self-pay | Admitting: Hematology

## 2023-03-20 LAB — KAPPA/LAMBDA LIGHT CHAINS
Kappa free light chain: 4.4 mg/L (ref 3.3–19.4)
Kappa, lambda light chain ratio: 1.42 (ref 0.26–1.65)
Lambda free light chains: 3.1 mg/L — ABNORMAL LOW (ref 5.7–26.3)

## 2023-03-23 LAB — PROTEIN ELECTROPHORESIS, SERUM
A/G Ratio: 1.8 — ABNORMAL HIGH (ref 0.7–1.7)
Albumin ELP: 3.4 g/dL (ref 2.9–4.4)
Alpha-1-Globulin: 0.3 g/dL (ref 0.0–0.4)
Alpha-2-Globulin: 0.5 g/dL (ref 0.4–1.0)
Beta Globulin: 0.8 g/dL (ref 0.7–1.3)
Gamma Globulin: 0.4 g/dL (ref 0.4–1.8)
Globulin, Total: 1.9 g/dL — ABNORMAL LOW (ref 2.2–3.9)
M-Spike, %: 0.1 g/dL — ABNORMAL HIGH
Total Protein ELP: 5.3 g/dL — ABNORMAL LOW (ref 6.0–8.5)

## 2023-03-25 NOTE — Progress Notes (Signed)
[facility] [address] [place] [address]    Clinic Day:  03/26/2023  Referring physician: Lindell Spar, MD  Patient Care Team: Lindell Spar, MD as PCP - General (Internal Medicine) Satira Sark, MD as PCP - Cardiology (Cardiology) Gala Romney Cristopher Estimable, MD as Consulting Physician (Gastroenterology) Luis Jack, MD as Medical Oncologist (Medical Oncology) Brien Mates, RN as Oncology Nurse Navigator (Medical Oncology)   ASSESSMENT & PLAN:   Assessment: 1. Prostate cancer: - TRUS/Bx on 06/14/2015: 1/12 cores positive for adenocarcinoma-10% of the core in the right lateral base revealed Gleason 3+3=6.  Prostate volume 175 cc.  PSA was 16.6.  PSA density 0.09. - Seen by Dr. Louis Meckel in [facility] for robotic prostatectomy.  Due to low-volume prostate cancer and very low PSA density, it was recommended that he continue active surveillance. - 01/21/2016: Surveillance TRUS/biopsy, following a prostatic MRI which revealed no suspicious prostatic lesions and correlated the patient's large prostate.  All 12 cores were negative for prostate cancer.  Prostate volume was 190 mL. - 08/11/2018: Fusion TRUS/biopsy: Recent prostate MRI-volume 220 mL, no significant lesions.  No evidence of transcapsular/.  Ureteral, SV/bony or lymphatic disease.  Path-1 core (left apex) revealed Gleason 3+3 and 5% of core. - PSA: 17.16 (10/09/2015), 15.4 (11/04/2016), 17.9 (05/15/2017), 18.4 (04/19/2018), 15.9 (02/15/2019), 15.5 (03/20/2020), 22.4 (03/20/2021) - MRI of the lumbar spine with and without contrast on 11/27/2021 done for back pain showed expanded appearance of the sacrum with diffusely abnormal bone marrow signal and contrast-enhancement concerning for metastatic disease. - CT pelvis with contrast on 11/27/2021: Expansile, heterogeneous trabeculated appearance of the sacrum with thickening of the cortex, unchanged from multiple prior exams.  No evidence of pelvic  lymphadenopathy.  Extreme prostatomegaly. -PSMA PET scan (03/13/2022): Markedly enlarged prostate gland with focal activity inferior left aspect of the gland suggesting prostatic adenocarcinoma.  No nodal metastasis in the abdomen or pelvis.  Multifocal skeletal metastasis in the mid sacrum, right sacral ala, subtle lesions in the L2 and several rib lesions. - Degarelix started on 04/15/2022   2. Social/family history: - Lives at home with his wife.  Retired in 2003 from [employer].  He works part-time work at an [employer].  Non-smoker. - Sister had cholangiocarcinoma.  Paternal first cousin had metastatic cancer.  Paternal uncle had prostate cancer.  3.  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  Core biopsy demonstrates hypercellular marrow (95%) with large atypical plasma cells arranged in sheets, by CD138 IHC plasma cells comprise 70% of the cellular elements, kappa restricted.  Amyloid was negative. - Chromosome analysis: 24, XY, del(20)(q11.2q13.1) [5]/46,XY[17] - Myeloma FISH panel: Tetrasomies1, 4, 11, 16 and 20, deletion of 13 q., gain of 14 q./rearrangement of IgH gene - PET CT scan (04/03/2022): No sign of tracer avid lesions of myeloma.  Chronic progressive changes involving the sacrum, left iliac bone including accentuation of trabecular markings and cortical thickening with low FDG uptake suggestive of chronic Paget's disease.  Marked prostate gland enlargement. - BMBX (03/20/2022):Hypercellular bone marrow with at least 25% plasma cells on aspirate with atypical features.  Core biopsy demonstrates hypercellular marrow for age (95%) with large atypical plasma cells arranged in sheets.  By CD138 IHC, plasma cells comprise approximately 70% of cellular elements and are kappa restricted.  Amyloid deposition was negative by Congo red.  Karyotype showed 20 q. deletion.   Myeloma  FISH panel showed complex abnormalities. - Dara KRD started on 04/15/2022, 8 cycles completed. - Stem cell collection was completed at [facility].  Patient decided to do transplant at first relapse.   Plan: 1. 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  I will switch degarelix to Eligard 45 mg today.  2.  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 - After 2 to 3 days of taking Revlimid, he started having itching in the groin and underarms.  No rash was reported.  He was taking Lyrica at the same time.  Hence he stopped both of them.  He reintroduced Revlimid and had similar itching come back.  Hydroxyzine has helped with itching.  He has taken probably 7 to 8 tablets of Revlimid in the last 4 weeks. - Reviewed myeloma labs from 03/19/2023.  M spike is 0.1 g.  Free light chain ratio is normal at 1.42. - Recommend restarting Revlimid and use hydroxyzine as needed. - RTC 4 weeks with myeloma labs 1 week prior. - He had tingling and numbness in the legs for many years.  He had a couple of episodes when he did not have any feeling in the right leg which lasted few minutes.  We will closely monitor.  3.  Normocytic anemia: - Hemoglobin today is 10.7.  Will check ferritin and iron panel at next visit.  4.  Osteopenia (DEXA scan 06/04/2022: T score -2.1): - Calcium is normal today.  Proceed with denosumab.  No orders of the defined types were placed in this encounter.     I,[name],acting as a Education administrator for Luis Jack, MD.,have documented all relevant documentation on the behalf of Luis Jack, MD,as directed by  Luis Jack, MD while in the presence of Luis Jack, MD.   I, Luis Jack MD, have reviewed the above documentation for accuracy and completeness, and I agree with the above.   Luis Jack, MD   4/4/20247:09  PM  CHIEF COMPLAINT:   Diagnosis: prostate cancer and multiple myeloma    Cancer Staging  Multiple myeloma Staging form: Plasma Cell Myeloma and Plasma Cell Disorders, AJCC 8th Edition - Clinical: No stage assigned - Unsigned    Prior Therapy: none  Current Therapy:  Carfilzomib (20/70) + Daratumumab SQ + Dexamethasone (20/40) DaraKRd q28d    HISTORY OF PRESENT ILLNESS:   Oncology History  Multiple myeloma  04/09/2022 Initial Diagnosis   Multiple myeloma ([place])   04/15/2022 - 11/11/2022 Chemotherapy   Patient is on Treatment Plan : MYELOMA RELAPSED/REFRACTORY Carfilzomib (20/70) + Daratumumab SQ + Dexamethasone (20/40) DaraKd q28d        INTERVAL HISTORY:   Vestel is a 72 y.o. male presenting to clinic today for follow up of prostate cancer and multiple myeloma. He was last seen by me on 02/26/23.  Today, he states that he is doing well overall. His appetite level is at 100%. His energy level is at 85%.  PAST MEDICAL HISTORY:   Past Medical History: Past Medical History:  Diagnosis Date   Arthritis    Asbestosis ([place])    Closed fracture of distal end of fibula with tibia with routine healing 05/28/2020   COVID-19 09/03/2021   Cyst of kidney, acquired    Headache    History of fracture of leg    Right, childhood   Hx of migraine headaches    Hyperlipidemia    Neuropathy  PE (pulmonary thromboembolism) ([place])    After knee surgery   Prostate cancer ([place]) 2015   Adenocarcinoma by biopsy    Surgical History: Past Surgical History:  Procedure Laterality Date   BIOPSY PROSTATE  2003 and 2005   COLONOSCOPY  11/28/2008   Dr. Rourk:internal hemorrhoids/tortus colon/ascending colon polyps, tubular adenomas   COLONOSCOPY N/A 09/04/2014   Procedure: COLONOSCOPY;  Surgeon: Daneil Dolin, MD;  [facility];  Service: Endoscopy;  Laterality: N/A;  9:30   COLONOSCOPY N/A 07/17/2017   Procedure: COLONOSCOPY;  Surgeon: Danie Binder, MD;  [facility];   Service: Endoscopy;  Laterality: N/A;   ESOPHAGOGASTRODUODENOSCOPY N/A 07/16/2017   Procedure: ESOPHAGOGASTRODUODENOSCOPY (EGD);  Surgeon: Danie Binder, MD;  [facility];  Service: Endoscopy;  Laterality: N/A;   GIVENS CAPSULE STUDY  07/16/2017   Procedure: GIVENS CAPSULE STUDY;  Surgeon: Danie Binder, MD;  [facility];  Service: Endoscopy;;   JOINT REPLACEMENT N/A    Phreesia 01/12/2021   POLYPECTOMY  07/17/2017   Procedure: POLYPECTOMY;  Surgeon: Danie Binder, MD;  [facility];  Service: Endoscopy;;  cecal   PORTACATH PLACEMENT Left 04/18/2022   Procedure: INSERTION PORT-A-CATH;  Surgeon: Aviva Signs, MD;  Location: [facility];  Service: General;  Laterality: Left;   TOTAL KNEE ARTHROPLASTY Right 06/10/2017   TOTAL KNEE ARTHROPLASTY Right 06/10/2017   Procedure: TOTAL KNEE ARTHROPLASTY;  Surgeon: [name], MD;  Location: [facility];  Service: Orthopedics;  Laterality: Right;    Social History: Social History   Socioeconomic History   Marital status: Married    Spouse name: Not on file   Number of children: Not on file   Years of education: Not on file   Highest education level: Not on file  Occupational History   Occupation: Retired  Tobacco Use   Smoking status: Never   Smokeless tobacco: Never  Vaping Use   Vaping Use: Never used  Substance and Sexual Activity   Alcohol use: No   Drug use: No   Sexual activity: Yes  Other Topics Concern   Not on file  Social History Narrative   Not on file   Social Determinants of Health   Financial Resource Strain: Low Risk  (02/09/2023)   Overall Financial Resource Strain (CARDIA)    Difficulty of Paying Living Expenses: Not hard at all  Food Insecurity: No Food Insecurity (02/09/2023)   Hunger Vital Sign    Worried About Running Out of Food in the Last Year: Never true    [place] in the Last Year: Never true  Transportation Needs: No Transportation Needs (02/09/2023)   PRAPARE -  Hydrologist (Medical): No    Lack of Transportation (Non-Medical): No  Physical Activity: Insufficiently Active (02/09/2023)   Exercise Vital Sign    Days of Exercise per Week: 5 days    Minutes of Exercise per Session: 20 min  Stress: No Stress Concern Present (02/09/2023)   [place]    Feeling of Stress : Not at all  Social Connections: [place] (02/09/2023)   Social Connection and Isolation Panel [NHANES]    Frequency of Communication with Friends and Family: More than three times a week    Frequency of Social Gatherings with Friends and Family: More than three times a week    Attends Religious Services: More than 4 times per year  Active Member of Clubs or Organizations: Yes    Attends Archivist Meetings: More than 4 times per year    Marital Status: Married  Human resources officer Violence: Not At Risk (02/09/2023)   Humiliation, Afraid, Rape, and Kick questionnaire    Fear of Current or Ex-Partner: No    Emotionally Abused: No    Physically Abused: No    Sexually Abused: No    Family History: Family History  Problem Relation Age of Onset   Diabetes Mother    Hypertension Mother    Obesity Mother    Heart disease Mother    Mental illness Mother    Hypertension Sister    Obesity Sister    Arthritis Sister    Deep vein thrombosis Father    Dementia Father    Heart disease Father        CABG   Colon cancer Neg Hx     Current Medications:  Current Outpatient Medications:    acetaminophen (TYLENOL) 500 MG tablet, Take 1,000 mg by mouth every 6 (six) hours as needed for mild pain or moderate pain., Disp: , Rfl:    acyclovir (ZOVIRAX) 400 MG tablet, TAKE 1 TABLET BY MOUTH TWICE A DAY, Disp: 180 tablet, Rfl: 2   aspirin EC 81 MG tablet, Take 81 mg by mouth daily. Swallow whole., Disp: , Rfl:    Carfilzomib (KYPROLIS IV), Inject into the vein once a week., Disp:  , Rfl:    Cholecalciferol (VITAMIN D) 2000 units CAPS, Take 2,000 Units by mouth every other day., Disp: , Rfl:    daratumumab-hyaluronidase-fihj (DARZALEX FASPRO) 1800-30000 MG-UT/15ML SOLN, Inject 1,800 mg into the skin once a week., Disp: , Rfl:    hydrOXYzine (ATARAX) 10 MG tablet, TAKE 1 TABLET BY MOUTH EVERY 6 HOURS AS NEEDED FOR ITCHING, Disp: 360 tablet, Rfl: 1   lenalidomide (REVLIMID) 15 MG capsule, Take 1 capsule (15 mg total) by mouth daily. Take for 21 days, then hold for 7 days. Repeat every 28 days., Disp: 21 capsule, Rfl: 0   lidocaine-prilocaine (EMLA) cream, Apply a small amount to port a cath site and cover with plastic wrap (do not rub in) 1 hour prior to infusion appointments, Disp: 30 g, Rfl: 0   methocarbamol (ROBAXIN) 500 MG tablet, Take 1 tablet (500 mg total) by mouth 3 (three) times daily., Disp: 60 tablet, Rfl: 1   Multiple Vitamin (MULTIVITAMIN WITH MINERALS) TABS tablet, Take 1 tablet by mouth daily with breakfast. Centrum Silver for Men, Disp: , Rfl:    nitroGLYCERIN (NITROSTAT) 0.4 MG SL tablet, Place 1 tablet (0.4 mg total) under the tongue every 5 (five) minutes x 3 doses as needed for chest pain (If no relief after 3rd dose, call or go to ED.)., Disp: 30 tablet, Rfl: 0   pantoprazole (PROTONIX) 40 MG tablet, TAKE 1 TABLET BY MOUTH EVERY OTHER DAY, Disp: 45 tablet, Rfl: 1   pregabalin (LYRICA) 50 MG capsule, Take 1 capsule (50 mg total) by mouth daily., Disp: 30 capsule, Rfl: 5   prochlorperazine (COMPAZINE) 10 MG tablet, Take 1 tablet (10 mg total) by mouth every 6 (six) hours as needed for nausea or vomiting., Disp: 30 tablet, Rfl: 3   tamsulosin (FLOMAX) 0.4 MG CAPS capsule, Take 1 capsule (0.4 mg total) by mouth daily., Disp: 30 capsule, Rfl: 11   Allergies: Allergies  Allergen Reactions   Sulfa Antibiotics Other (See Comments)    Unknown reaction. Childhood reaction    REVIEW OF SYSTEMS:  Review of Systems  Constitutional:  Negative for chills, fatigue  and fever.  HENT:   Negative for lump/mass, mouth sores, nosebleeds, sore throat and trouble swallowing.   Eyes:  Negative for eye problems.  Respiratory:  Negative for cough and shortness of breath.   Cardiovascular:  Negative for chest pain, leg swelling and palpitations.  Gastrointestinal:  Negative for abdominal pain, constipation, diarrhea, nausea and vomiting.  Genitourinary:  Negative for bladder incontinence, difficulty urinating, dysuria, frequency, hematuria and nocturia.   Musculoskeletal:  Positive for back pain. Negative for arthralgias, flank pain, myalgias and neck pain.  Skin:  Negative for itching and rash.  Neurological:  Positive for numbness. Negative for dizziness and headaches.  Hematological:  Does not bruise/bleed easily.  Psychiatric/Behavioral:  Positive for sleep disturbance. Negative for depression and suicidal ideas. The patient is not nervous/anxious.   All other systems reviewed and are negative.    VITALS:   Blood pressure 129/70, pulse 69, temperature (!) 97.3 F (36.3 C), temperature source Tympanic, resp. rate 18, height 6\' 2"  (1.88 m), weight 199 lb 12.8 oz (90.6 kg), SpO2 99 %.  Wt Readings from Last 3 Encounters:  03/26/23 199 lb 12.8 oz (90.6 kg)  02/26/23 197 lb 9.6 oz (89.6 kg)  02/18/23 200 lb 3.2 oz (90.8 kg)    Body mass index is 25.65 kg/m.  Performance status (ECOG): 1 - Symptomatic but completely ambulatory  PHYSICAL EXAM:   Physical Exam Vitals and nursing note reviewed. Exam conducted with a chaperone present.  Constitutional:      Appearance: Normal appearance.  Cardiovascular:     Rate and Rhythm: Normal rate and regular rhythm.     Pulses: Normal pulses.     Heart sounds: Normal heart sounds.  Pulmonary:     Effort: Pulmonary effort is normal.     Breath sounds: Normal breath sounds.  Abdominal:     Palpations: Abdomen is soft. There is no hepatomegaly, splenomegaly or mass.     Tenderness: There is no abdominal  tenderness.  Musculoskeletal:     Right lower leg: No edema.     Left lower leg: No edema.  Lymphadenopathy:     Cervical: No cervical adenopathy.     Right cervical: No superficial, deep or posterior cervical adenopathy.    Left cervical: No superficial, deep or posterior cervical adenopathy.     Upper Body:     Right upper body: No supraclavicular or axillary adenopathy.     Left upper body: No supraclavicular or axillary adenopathy.  Neurological:     General: No focal deficit present.     Mental Status: He is alert and oriented to person, place, and time.  Psychiatric:        Mood and Affect: Mood normal.        Behavior: Behavior normal.     LABS:      Latest Ref Rng & Units 03/19/2023   10:30 AM 02/26/2023   11:13 AM 01/29/2023   11:00 AM  CBC  WBC 4.0 - 10.5 K/uL 2.8  3.0  3.0   Hemoglobin 13.0 - 17.0 g/dL 10.7  11.2  10.3   Hematocrit 39.0 - 52.0 % 31.9  33.2  31.0   Platelets 150 - 400 K/uL 194  206  170       Latest Ref Rng & Units 03/19/2023   10:30 AM 02/26/2023   11:13 AM 01/29/2023   11:00 AM  CMP  Glucose 70 - 99 mg/dL 95  95  106   BUN 8 - 23 mg/dL 19  22  20    Creatinine 0.61 - 1.24 mg/dL 1.02  1.12  1.06   Sodium 135 - 145 mmol/L 140  139  141   Potassium 3.5 - 5.1 mmol/L 3.7  4.0  4.0   Chloride 98 - 111 mmol/L 108  107  109   CO2 22 - 32 mmol/L 25  25  25    Calcium 8.9 - 10.3 mg/dL 8.8  8.9  8.2   Total Protein 6.5 - 8.1 g/dL 5.8  5.9  5.7   Total Bilirubin 0.3 - 1.2 mg/dL 0.6  0.6  0.4   Alkaline Phos 38 - 126 U/L 35  41  50   AST 15 - 41 U/L 22  22  21    ALT 0 - 44 U/L 24  21  21       No results found for: "CEA1", "CEA" / No results found for: "CEA1", "CEA" Lab Results  Component Value Date   PSA1 22.5 (H) 11/05/2021   No results found for: "[id number]" No results found for: "CAN125"  Lab Results  Component Value Date   TOTALPROTELP 5.3 (L) 03/19/2023   ALBUMINELP 3.4 03/19/2023   A1GS 0.3 03/19/2023   A2GS 0.5 03/19/2023   BETS 0.8  03/19/2023   GAMS 0.4 03/19/2023   MSPIKE 0.1 (H) 03/19/2023   SPEI Comment 03/19/2023   Lab Results  Component Value Date   TIBC 301 02/26/2022   TIBC 262 07/15/2017   FERRITIN 168 02/26/2022   FERRITIN 145 07/15/2017   IRONPCTSAT 42 (H) 02/26/2022   IRONPCTSAT 15 (L) 07/15/2017   Lab Results  Component Value Date   LDH 142 10/21/2022   LDH 131 08/26/2022   LDH 124 07/29/2022     STUDIES:   No results found.

## 2023-03-26 ENCOUNTER — Inpatient Hospital Stay: Payer: Medicare HMO | Attending: Hematology | Admitting: Hematology

## 2023-03-26 ENCOUNTER — Inpatient Hospital Stay: Payer: Medicare HMO

## 2023-03-26 VITALS — BP 129/70 | HR 69 | Temp 97.3°F | Resp 18 | Ht 74.0 in | Wt 199.8 lb

## 2023-03-26 DIAGNOSIS — Z5111 Encounter for antineoplastic chemotherapy: Secondary | ICD-10-CM | POA: Diagnosis not present

## 2023-03-26 DIAGNOSIS — C61 Malignant neoplasm of prostate: Secondary | ICD-10-CM | POA: Insufficient documentation

## 2023-03-26 DIAGNOSIS — C9000 Multiple myeloma not having achieved remission: Secondary | ICD-10-CM | POA: Diagnosis not present

## 2023-03-26 DIAGNOSIS — Z79899 Other long term (current) drug therapy: Secondary | ICD-10-CM | POA: Insufficient documentation

## 2023-03-26 MED ORDER — DENOSUMAB 120 MG/1.7ML ~~LOC~~ SOLN
120.0000 mg | Freq: Once | SUBCUTANEOUS | Status: AC
  Administered 2023-03-26: 120 mg via SUBCUTANEOUS
  Filled 2023-03-26: qty 1.7

## 2023-03-26 MED ORDER — LEUPROLIDE ACETATE (6 MONTH) 45 MG ~~LOC~~ KIT
45.0000 mg | PACK | Freq: Once | SUBCUTANEOUS | Status: AC
  Administered 2023-03-26: 45 mg via SUBCUTANEOUS
  Filled 2023-03-26: qty 45

## 2023-03-26 NOTE — Patient Instructions (Signed)
[facility]  Discharge Instructions  You were seen and examined today by Dr. Delton Coombes.  Dr. Delton Coombes discussed your most recent lab work which is stable.  Continue with injections as planned today.  Follow-up as scheduled.   Thank you for choosing [facility] to provide your oncology and hematology care.   To afford each patient quality time with our provider, please arrive at least 15 minutes before your scheduled appointment time. You may need to reschedule your appointment if you arrive late (10 or more minutes). Arriving late affects you and other patients whose appointments are after yours.  Also, if you miss three or more appointments without notifying the office, you may be dismissed from the clinic at the provider's discretion.    Again, thank you for choosing [facility].  Our hope is that these requests will decrease the amount of time that you wait before being seen by our physicians.   If you have a lab appointment with the [facility] - please note that after April 8th, all labs will be drawn in the cancer center.  You do not have to check in or register with the main entrance as you have in the past but will complete your check-in at the cancer center.            _____________________________________________________________  Should you have questions after your visit to [facility], please contact our office at [phone] and follow the prompts.  Our office hours are 8:00 a.m. to 4:30 p.m. Monday - Thursday and 8:00 a.m. to 2:30 p.m. Friday.  Please note that voicemails left after 4:00 p.m. may not be returned until the following business day.  We are closed weekends and all major holidays.  You do have access to a nurse 24-7, just call the main number to the clinic [phone] and do not press any options, hold on the line and a nurse will answer the phone.    For prescription refill  requests, have your pharmacy contact our office and allow 72 hours.    Masks are no longer required in the cancer centers. If you would like for your care team to wear a mask while they are taking care of you, please let them know. You may have one support person who is at least 73 years old accompany you for your appointments.

## 2023-03-26 NOTE — Progress Notes (Signed)
Confirmed switch to Eligard 45 mg q 6 months.  T.O. Dr Rhys Martini, PharmD

## 2023-03-26 NOTE — Progress Notes (Signed)
Patient has been assessed, vital signs and labs have been reviewed by Dr. Katragadda. ANC, Creatinine, LFTs, and Platelets are within treatment parameters per Dr. Katragadda. The patient is good to proceed with treatment at this time. Primary RN and pharmacy aware.  

## 2023-03-26 NOTE — Progress Notes (Signed)
Patient tolerated Eligard injection with no complaints voiced. Site clean and dry with no bruising or swelling noted at site. See MAR for details. Band aid applied.   Patient taking calcium as directed. Denied tooth, jaw, and leg pain. No recent or upcoming dental visits. Labs reviewed. Patient tolerated injection with no complaints voiced. See MAR for details. Patient stable during and after injection. Site clean and dry with no bruising or swelling noted. Band aid applied. Vss with discharge and left in satisfactory condition with no s/s of distress.

## 2023-03-26 NOTE — Patient Instructions (Signed)
[facility]  Discharge Instructions: Thank you for choosing [facility] to provide your oncology and hematology care.  If you have a lab appointment with the [facility] - please note that after April 8th, 2024, all labs will be drawn in the cancer center.  You do not have to check in or register with the main entrance as you have in the past but will complete your check-in in the cancer center.  Wear comfortable clothing and clothing appropriate for easy access to any Portacath or PICC line.   We strive to give you quality time with your provider. You may need to reschedule your appointment if you arrive late (15 or more minutes).  Arriving late affects you and other patients whose appointments are after yours.  Also, if you miss three or more appointments without notifying the office, you may be dismissed from the clinic at the provider's discretion.      For prescription refill requests, have your pharmacy contact our office and allow 72 hours for refills to be completed.    Today you received the following Eligard & xgeva, return as scheduled.   To help prevent nausea and vomiting after your treatment, we encourage you to take your nausea medication as directed.  BELOW ARE SYMPTOMS THAT SHOULD BE REPORTED IMMEDIATELY: *FEVER GREATER THAN 100.4 F (38 C) OR HIGHER *CHILLS OR SWEATING *NAUSEA AND VOMITING THAT IS NOT CONTROLLED WITH YOUR NAUSEA MEDICATION *UNUSUAL SHORTNESS OF BREATH *UNUSUAL BRUISING OR BLEEDING *URINARY PROBLEMS (pain or burning when urinating, or frequent urination) *BOWEL PROBLEMS (unusual diarrhea, constipation, pain near the anus) TENDERNESS IN MOUTH AND THROAT WITH OR WITHOUT PRESENCE OF ULCERS (sore throat, sores in mouth, or a toothache) UNUSUAL RASH, SWELLING OR PAIN  UNUSUAL VAGINAL DISCHARGE OR ITCHING   Items with * indicate a potential emergency and should be followed up as soon as possible or go to the Emergency  Department if any problems should occur.  Please show the CHEMOTHERAPY ALERT CARD or IMMUNOTHERAPY ALERT CARD at check-in to the Emergency Department and triage nurse.  Should you have questions after your visit or need to cancel or reschedule your appointment, please contact [facility] [phone]  and follow the prompts.  Office hours are 8:00 a.m. to 4:30 p.m. Monday - Friday. Please note that voicemails left after 4:00 p.m. may not be returned until the following business day.  We are closed weekends and major holidays. You have access to a nurse at all times for urgent questions. Please call the main number to the clinic [phone] and follow the prompts.  For any non-urgent questions, you may also contact your provider using MyChart. We now offer e-Visits for anyone 39 and older to request care online for non-urgent symptoms. For details visit [email or web address].   Also download the MyChart app! Go to the app store, search "MyChart", open the app, select [facility], and log in with your MyChart username and password.

## 2023-04-17 ENCOUNTER — Other Ambulatory Visit: Payer: Medicare HMO

## 2023-04-20 ENCOUNTER — Other Ambulatory Visit: Payer: Self-pay

## 2023-04-20 ENCOUNTER — Inpatient Hospital Stay: Payer: Medicare HMO

## 2023-04-20 ENCOUNTER — Other Ambulatory Visit: Payer: Medicare HMO

## 2023-04-20 VITALS — BP 135/71 | HR 60 | Temp 97.3°F | Resp 18

## 2023-04-20 DIAGNOSIS — C9000 Multiple myeloma not having achieved remission: Secondary | ICD-10-CM | POA: Diagnosis not present

## 2023-04-20 DIAGNOSIS — Z5111 Encounter for antineoplastic chemotherapy: Secondary | ICD-10-CM | POA: Diagnosis not present

## 2023-04-20 DIAGNOSIS — C61 Malignant neoplasm of prostate: Secondary | ICD-10-CM | POA: Diagnosis not present

## 2023-04-20 DIAGNOSIS — Z79899 Other long term (current) drug therapy: Secondary | ICD-10-CM | POA: Diagnosis not present

## 2023-04-20 DIAGNOSIS — Z95828 Presence of other vascular implants and grafts: Secondary | ICD-10-CM

## 2023-04-20 LAB — CBC WITH DIFFERENTIAL/PLATELET
Abs Immature Granulocytes: 0.01 10*3/uL (ref 0.00–0.07)
Basophils Absolute: 0.1 10*3/uL (ref 0.0–0.1)
Basophils Relative: 1 %
Eosinophils Absolute: 0.2 10*3/uL (ref 0.0–0.5)
Eosinophils Relative: 4 %
HCT: 33.6 % — ABNORMAL LOW (ref 39.0–52.0)
Hemoglobin: 11.1 g/dL — ABNORMAL LOW (ref 13.0–17.0)
Immature Granulocytes: 0 %
Lymphocytes Relative: 7 %
Lymphs Abs: 0.4 10*3/uL — ABNORMAL LOW (ref 0.7–4.0)
MCH: 28.9 pg (ref 26.0–34.0)
MCHC: 33 g/dL (ref 30.0–36.0)
MCV: 87.5 fL (ref 80.0–100.0)
Monocytes Absolute: 0.5 10*3/uL (ref 0.1–1.0)
Monocytes Relative: 10 %
Neutro Abs: 3.9 10*3/uL (ref 1.7–7.7)
Neutrophils Relative %: 78 %
Platelets: 167 10*3/uL (ref 150–400)
RBC: 3.84 MIL/uL — ABNORMAL LOW (ref 4.22–5.81)
RDW: 16.4 % — ABNORMAL HIGH (ref 11.5–15.5)
WBC: 5 10*3/uL (ref 4.0–10.5)
nRBC: 0 % (ref 0.0–0.2)

## 2023-04-20 LAB — COMPREHENSIVE METABOLIC PANEL
ALT: 25 U/L (ref 0–44)
AST: 21 U/L (ref 15–41)
Albumin: 3.5 g/dL (ref 3.5–5.0)
Alkaline Phosphatase: 33 U/L — ABNORMAL LOW (ref 38–126)
Anion gap: 7 (ref 5–15)
BUN: 23 mg/dL (ref 8–23)
CO2: 25 mmol/L (ref 22–32)
Calcium: 8.6 mg/dL — ABNORMAL LOW (ref 8.9–10.3)
Chloride: 107 mmol/L (ref 98–111)
Creatinine, Ser: 1.13 mg/dL (ref 0.61–1.24)
GFR, Estimated: >60 mL/min (ref >60)
Glucose, Bld: 96 mg/dL (ref 70–99)
Potassium: 4 mmol/L (ref 3.5–5.1)
Sodium: 139 mmol/L (ref 135–145)
Total Bilirubin: 0.7 mg/dL (ref 0.3–1.2)
Total Protein: 5.8 g/dL — ABNORMAL LOW (ref 6.5–8.1)

## 2023-04-20 LAB — MAGNESIUM: Magnesium: 1.9 mg/dL (ref 1.7–2.4)

## 2023-04-20 LAB — PSA: Prostatic Specific Antigen: 1.33 ng/mL (ref 0.00–4.00)

## 2023-04-20 MED ORDER — HEPARIN SOD (PORK) LOCK FLUSH 100 UNIT/ML IV SOLN
500.0000 [IU] | Freq: Once | INTRAVENOUS | Status: AC
  Administered 2023-04-20: 500 [IU] via INTRAVENOUS

## 2023-04-20 MED ORDER — SODIUM CHLORIDE 0.9% FLUSH
10.0000 mL | Freq: Once | INTRAVENOUS | Status: AC
  Administered 2023-04-20: 10 mL via INTRAVENOUS

## 2023-04-20 NOTE — Progress Notes (Signed)
Port flushed with good blood return noted. No bruising or swelling at site. Bandaid applied and patient discharged in satisfactory condition. VVS stable with no signs or symptoms of distressed noted. 

## 2023-04-20 NOTE — Patient Instructions (Signed)
[facility]  Discharge Instructions: Thank you for choosing [facility] to provide your oncology and hematology care.  If you have a lab appointment with the Cancer Center - please note that after April 8th, 2024, all labs will be drawn in the cancer center.  You do not have to check in or register with the main entrance as you have in the past but will complete your check-in in the cancer center.  Wear comfortable clothing and clothing appropriate for easy access to any Portacath or PICC line.   We strive to give you quality time with your provider. You may need to reschedule your appointment if you arrive late (15 or more minutes).  Arriving late affects you and other patients whose appointments are after yours.  Also, if you miss three or more appointments without notifying the office, you may be dismissed from the clinic at the provider's discretion.      For prescription refill requests, have your pharmacy contact our office and allow 72 hours for refills to be completed.    Today you received the following port flush, return as scheduled.   To help prevent nausea and vomiting after your treatment, we encourage you to take your nausea medication as directed.  BELOW ARE SYMPTOMS THAT SHOULD BE REPORTED IMMEDIATELY: *FEVER GREATER THAN 100.4 F (38 C) OR HIGHER *CHILLS OR SWEATING *NAUSEA AND VOMITING THAT IS NOT CONTROLLED WITH YOUR NAUSEA MEDICATION *UNUSUAL SHORTNESS OF BREATH *UNUSUAL BRUISING OR BLEEDING *URINARY PROBLEMS (pain or burning when urinating, or frequent urination) *BOWEL PROBLEMS (unusual diarrhea, constipation, pain near the anus) TENDERNESS IN MOUTH AND THROAT WITH OR WITHOUT PRESENCE OF ULCERS (sore throat, sores in mouth, or a toothache) UNUSUAL RASH, SWELLING OR PAIN  UNUSUAL VAGINAL DISCHARGE OR ITCHING   Items with * indicate a potential emergency and should be followed up as soon as possible or go to the Emergency Department  if any problems should occur.  Please show the CHEMOTHERAPY ALERT CARD or IMMUNOTHERAPY ALERT CARD at check-in to the Emergency Department and triage nurse.  Should you have questions after your visit or need to cancel or reschedule your appointment, please contact [facility] [phone]  and follow the prompts.  Office hours are 8:00 a.m. to 4:30 p.m. Monday - Friday. Please note that voicemails left after 4:00 p.m. may not be returned until the following business day.  We are closed weekends and major holidays. You have access to a nurse at all times for urgent questions. Please call the main number to the clinic [phone] and follow the prompts.  For any non-urgent questions, you may also contact your provider using MyChart. We now offer e-Visits for anyone 18 and older to request care online for non-urgent symptoms. For details visit [email or web address].   Also download the MyChart app! Go to the app store, search "MyChart", open the app, select [place], and log in with your MyChart username and password.   

## 2023-04-21 LAB — KAPPA/LAMBDA LIGHT CHAINS
Kappa free light chain: 4.5 mg/L (ref 3.3–19.4)
Kappa, lambda light chain ratio: 1.55 (ref 0.26–1.65)
Lambda free light chains: 2.9 mg/L — ABNORMAL LOW (ref 5.7–26.3)

## 2023-04-22 LAB — PROTEIN ELECTROPHORESIS, SERUM
A/G Ratio: 1.7 (ref 0.7–1.7)
Albumin ELP: 3.3 g/dL (ref 2.9–4.4)
Alpha-1-Globulin: 0.2 g/dL (ref 0.0–0.4)
Alpha-2-Globulin: 0.5 g/dL (ref 0.4–1.0)
Beta Globulin: 0.9 g/dL (ref 0.7–1.3)
Gamma Globulin: 0.3 g/dL — ABNORMAL LOW (ref 0.4–1.8)
Globulin, Total: 2 g/dL — ABNORMAL LOW (ref 2.2–3.9)
M-Spike, %: 0.1 g/dL — ABNORMAL HIGH
Total Protein ELP: 5.3 g/dL — ABNORMAL LOW (ref 6.0–8.5)

## 2023-04-25 NOTE — Progress Notes (Signed)
History of Present Illness:    Prior history of elevated PSA--s/p TRUS/Bx in [place] in 2005 and 2008. Apparently, all cores were negative but some were not indicative of prostate tissue at all.   Because of a high PCA 3 test in this office, he underwent TRUS/Bx on 6.23.2016. 1/12 cores were positive for adenocarcinoma-10% of the core in the right lateral base revealed Gleason 3+3 equal 6 adenocarcinoma. Prostate volume was 175 cc. PSA was 16.66. PSA density 0.09. He does not have significant lower urinary tract symptomatology.   He consulted with Dr. Marlou Porch in [place] to consider robotic prostatectomy. Due to his low volume prostate cancer, and very low PSA density, it was recommended that he continue with active surveillance.   1.30.2017: Surveillance TRUS/Bx , following a prostatic MRI which revealed no suspicious prostatic lesions and correlated the patient's large prostate. At that time, all 12 cores were negative for prostate cancer. Prostatic volume was 190 mL.   8.21.2019: fusion TRUS/Bx. Recent prostate MRI--volume 220 ml. No significant lesions. No evidence transcapsular/perineural,SV/bony or lymphatic disease. Path--1 core (left apex) revealed GS 3+3 pattern in 5% of core.    PSA followup:   10.18.2016--17.16 11.14.2017--15.4 5.25.2018--17.9 4.29.2019--18.4 2.25.2020--15.9 3.30.2021--15.5 3.30.2022--22.4   9.24.2021 MRI prostate. Prostate volume 250 mL. No significant change compared to prior exam. No radiographic evidence of high-grade prostate carcinoma. PI-RADS 2: Low (clinically significant cancer is unlikely to be present)   4.5.2022: PSA 22.4.   10.4.2022:  PSA 34.5 this was drawn while the patient was being treated for an active COVID infection.  He was put on finasteride after his last visit.  He was unable to tolerate it due to worsening of his lower urinary tract symptoms.   3.23.2023: PSMA PET scan-- 1. Multifocal radiotracer avid prostate cancer  skeletal metastasis. Broad lytic lesion within the sacrum with associated radiotracer activity. Multiple discrete lesions in the thoracic lumbar spine and ribs with minimal CT change. 2. Markedly enlarged prostate gland. Focal activity inferior LEFT aspect the gland is suggestive of prostate adenocarcinoma. 3. No nodal metastasis in the abdomen pelvis. No visceral metastasis.    11.7.2023: Here for recheck.  He is on [place] monthly for metastatic prostate cancer entheses confirmed by Pylarify PET scan).  He is receiving regular infusions for multiple myeloma.  He is having hot flashes and low energy level which may be attributable to his androgen deprivation therapy.  PSA prior to initiation of ADT was around 27, most recently 2.1.  5.7.2024: Here for recheck.  Most recent PSA up slightly to 1.33.  Prior to that it was 0.88 just over a month ago.  He donated stem cells for a potential stem cell transplant last week.  He is having no bony pain.  He is on tamsulosin-lower urinary tract symptoms got worse after he was started on ADT last spring.   Past Medical History:  Diagnosis Date   Arthritis    Asbestosis (HCC)    Closed fracture of distal end of fibula with tibia with routine healing 05/28/2020   COVID-19 09/03/2021   Cyst of kidney, acquired    Headache    History of fracture of leg    Right, childhood   Hx of migraine headaches    Hyperlipidemia    Neuropathy    PE (pulmonary thromboembolism) (HCC)    After knee surgery   Prostate cancer (HCC) 2015   Adenocarcinoma by biopsy    Past Surgical History:  Procedure Laterality Date   BIOPSY PROSTATE  2003 and 2005   COLONOSCOPY  11/28/2008   Dr. Rourk:internal hemorrhoids/tortus colon/ascending colon polyps, tubular adenomas   COLONOSCOPY N/A 09/04/2014   Procedure: COLONOSCOPY;  Surgeon: Corbin Ade, MD;  [facility]: [facility];  Service: Endoscopy;  Laterality: N/A;  9:30   COLONOSCOPY N/A 07/17/2017   Procedure:  COLONOSCOPY;  Surgeon: West Bali, MD;  Location: [facility];  Service: Endoscopy;  Laterality: N/A;   ESOPHAGOGASTRODUODENOSCOPY N/A 07/16/2017   Procedure: ESOPHAGOGASTRODUODENOSCOPY (EGD);  Surgeon: West Bali, MD;  [facility]: [facility];  Service: Endoscopy;  Laterality: N/A;   GIVENS CAPSULE STUDY  07/16/2017   Procedure: GIVENS CAPSULE STUDY;  Surgeon: West Bali, MD;  [facility]: [facility];  Service: Endoscopy;;   JOINT REPLACEMENT N/A    [employer]rees[employer] 01/12/2021   POLYPECTOMY  07/17/2017   Procedure: POLYPECTOMY;  Surgeon: West Bali, MD;  Location: [facility];  Service: Endoscopy;;  cecal   PORTACATH PLACEMENT Left 04/18/2022   Procedure: INSERTION PORT-A-CATH;  Surgeon: Franky Macho, MD;  Location: [facility];  Service: General;  Laterality: Left;   TOTAL KNEE ARTHROPLASTY Right 06/10/2017   TOTAL KNEE ARTHROPLASTY Right 06/10/2017   Procedure: TOTAL KNEE ARTHROPLASTY;  Surgeon: Loreta Ave, MD;  Location[facility];  Service: Orthopedics;  Laterality: Right;    Home Medications:  Allergies as of 04/28/2023       Reactions   Sulfa Antibiotics Other (See Comments)   Unknown reaction. Childhood reaction        Medication List        Accurate as of Apr 25, 2023  6:36 PM. If you have any questions, ask your nurse or doctor.          acetaminophen 500 MG tablet Commonly known as: TYLENOL Take 1,000 mg by mouth every 6 (six) hours as needed for mild pain or moderate pain.   acyclovir 400 MG tablet Commonly known as: ZOVIRAX TAKE 1 TABLET BY MOUTH TWICE A DAY   aspirin EC 81 MG tablet Take 81 mg by mouth daily. Swallow whole.   daratumumab-hyaluronidase-fihj 1800-30000 MG-UT/15ML Soln Commonly known as: DARZALEX FASPRO Inject 1,800 mg into the skin once a week.   hydrOXYzine 10 MG tablet Commonly known as: ATARAX TAKE 1 TABLET BY MOUTH EVERY 6 HOURS AS NEEDED FOR ITCHING   KYPROLIS IV Inject into the vein once a week.   lenalidomide 15 MG  capsule Commonly known as: REVLIMID Take 1 capsule (15 mg total) by mouth daily. Take for 21 days, then hold for 7 days. Repeat every 28 days.   lidocaine-prilocaine cream Commonly known as: EMLA Apply a small amount to port a cath site and cover with plastic wrap (do not rub in) 1 hour prior to infusion appointments   methocarbamol 500 MG tablet Commonly known as: ROBAXIN Take 1 tablet (500 mg total) by mouth 3 (three) times daily.   multivitamin with minerals Tabs tablet Take 1 tablet by mouth daily with breakfast. Centrum Silver for Men   nitroGLYCERIN 0.4 MG SL tablet Commonly known as: NITROSTAT Place 1 tablet (0.4 mg total) under the tongue every 5 (five) minutes x 3 doses as needed for chest pain (If no relief after 3rd dose, call or go to ED.).   pantoprazole 40 MG tablet Commonly known as: PROTONIX TAKE 1 TABLET BY MOUTH EVERY OTHER DAY   pregabalin 50 MG capsule Commonly known as: Lyrica Take 1 capsule (50 mg total) by mouth daily.   prochlorperazine 10 MG  tablet Commonly known as: COMPAZINE Take 1 tablet (10 mg total) by mouth every 6 (six) hours as needed for nausea or vomiting.   tamsulosin 0.4 MG Caps capsule Commonly known as: FLOMAX Take 1 capsule (0.4 mg total) by mouth daily.   Vitamin D 50 MCG (2000 UT) Caps Take 2,000 Units by mouth every other day.        Allergies:  Allergies  Allergen Reactions   Sulfa Antibiotics Other (See Comments)    Unknown reaction. Childhood reaction    Family History  Problem Relation Age of Onset   Diabetes Mother    Hypertension Mother    Obesity Mother    Heart disease Mother    Mental illness Mother    Hypertension Sister    Obesity Sister    Arthritis Sister    Deep vein thrombosis Father    Dementia Father    Heart disease Father        CABG   Colon cancer Neg Hx     Social History:  reports that he has never smoked. He has never used smokeless tobacco. He reports that he does not drink alcohol and  does not use drugs.  ROS: A complete review of systems was performed.  All systems are negative except for pertinent findings as noted.  Physical Exam:  Vital signs in last 24 hours: There were no vitals taken for this visit. Constitutional:  Alert and oriented, No acute distress Cardiovascular: Regular rate  Respiratory: Normal respiratory effort Neurologic: Grossly intact, no focal deficits Psychiatric: Normal mood and affect  I have reviewed prior pt notes  I have reviewed notes from referring/previous physicians  I have reviewed urinalysis results  I have independently reviewed prior imaging-PET scan  I have reviewed prior PSA results  IPSS reviewed (score 10, quality-of-life 2    Impression/Assessment:  Metastatic castrate sensitive prostate cancer (although slight PSA bump recently).  Patient on LT ADT with leuprolide  BPH, minimal symptoms but he has a huge gland.  Prior to ADT, he was voiding adequately.  On tamsulosin currently  Plan:  1.  I would recommend that, at the time of his next PSA check, he had a testosterone level drawn.  It is unusual, but some people develop resistance to leuprolide.  2.  On tamsulosin but I think he might try to wean this off  3.  I will see back in 6 months for recheck

## 2023-04-26 NOTE — Progress Notes (Signed)
[facility] [address], [place] [address]    Clinic Day:  04/27/2023  Referring physician: [name], MD  Patient Care Team: [name], MD as PCP - General (Internal Medicine) Jonelle Sidle, MD as PCP - Cardiology (Cardiology) Jena Gauss Gerrit Friends, MD as Consulting Physician (Gastroenterology) Doreatha Massed, MD as Medical Oncologist (Medical Oncology) Therese Sarah, RN as Oncology Nurse Navigator (Medical Oncology)   ASSESSMENT & PLAN:   Assessment: 1. Prostate cancer: - TRUS/Bx on 06/14/2015: 1/12 cores positive for adenocarcinoma-10% of the core in the right lateral base revealed Gleason 3+3=6.  Prostate volume 175 cc.  PSA was 16.6.  PSA density 0.09. - Seen by Dr. Marlou Porch in [facility] for robotic prostatectomy.  Due to low-volume prostate cancer and very low PSA density, it was recommended that he continue active surveillance. - 01/21/2016: Surveillance TRUS/biopsy, following a prostatic MRI which revealed no suspicious prostatic lesions and correlated the patient's large prostate.  All 12 cores were negative for prostate cancer.  Prostate volume was 190 mL. - 08/11/2018: Fusion TRUS/biopsy: Recent prostate MRI-volume 220 mL, no significant lesions.  No evidence of transcapsular/.  Ureteral, SV/bony or lymphatic disease.  Path-1 core (left apex) revealed Gleason 3+3 and 5% of core. - PSA: 17.16 (10/09/2015), 15.4 (11/04/2016), 17.9 (05/15/2017), 18.4 (04/19/2018), 15.9 (02/15/2019), 15.5 (03/20/2020), 22.4 (03/20/2021) - MRI of the lumbar spine with and without contrast on 11/27/2021 done for back pain showed expanded appearance of the sacrum with diffusely abnormal bone marrow signal and contrast-enhancement concerning for metastatic disease. - CT pelvis with contrast on 11/27/2021: Expansile, heterogeneous trabeculated appearance of the sacrum with thickening of the cortex, unchanged from multiple prior exams.  No evidence of pelvic  lymphadenopathy.  Extreme prostatomegaly. -PSMA PET scan (03/13/2022): Markedly enlarged prostate gland with focal activity inferior left aspect of the gland suggesting prostatic adenocarcinoma.  No nodal metastasis in the abdomen or pelvis.  Multifocal skeletal metastasis in the mid sacrum, right sacral ala, subtle lesions in the L2 and several rib lesions. - Degarelix started on 04/15/2022   2. Social/family history: - Lives at home with his wife.  Retired in 2003 from [employer].  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  Non-smoker. - Sister had cholangiocarcinoma.  Paternal first cousin had metastatic cancer.  Paternal uncle had prostate cancer.  3.  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  Core biopsy demonstrates hypercellular marrow (95%) with large atypical plasma cells arranged in sheets, by CD138 IHC plasma cells comprise 70% of the cellular elements, kappa restricted.  Amyloid was negative. 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  Chronic progressive changes involving the sacrum, left iliac bone including accentuation of trabecular markings and cortical thickening with low FDG uptake suggestive of chronic Paget's disease.  Marked prostate gland enlargement. - BMBX (03/20/2022):Hypercellular bone marrow with at least 25% plasma cells on aspirate with atypical features.  Core biopsy demonstrates hypercellular marrow for age (95%) with large atypical plasma cells arranged in sheets.  By CD138 IHC, plasma cells comprise approximately 70% of cellular elements and are kappa restricted.  Amyloid deposition was negative by Congo red.  Karyotype showed 20 q. deletion.   Myeloma  FISH panel showed complex abnormalities. - Dara KRD started on 04/15/2022, 8 cycles completed. - Stem cell collection was completed at [facility].  Patient decided to do transplant at first relapse.    Plan: 1. 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 - PSA today is 1.33, slightly up from 0.88.  Will monitor it again in 6 weeks at next visit.  2.  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 - Finished Revlimid 15 mg 2 weeks ago. - Reviewed myeloma labs from 04/20/2023: M spike is stable at 0.1 g.  Free light chain ratio is 1.55.  Kappa light chains 4.5. - Continue Revlimid 15 mg 3 weeks on/1 week off. - RTC 6 weeks for follow-up.  Will repeat immunofixation, free light chains and SPEP. - She reports sinus drainage with yellow secretions.  Will give him Augmentin for 7 days.  3.  Normocytic anemia: - Hemoglobin is 11.1, likely from myelosuppression.  Will closely monitor.  4.  Osteopenia (DEXA scan 06/04/2022: T score -2.1): - Calcium is 8.6.  Continue monthly denosumab.    Orders Placed This Encounter  Procedures   CBC with Differential    Standing Status:   Future    Standing Expiration Date:   04/26/2024   Comprehensive metabolic panel    Standing Status:   Future    Standing Expiration Date:   04/26/2024   Kappa/lambda light chains    Standing Status:   Future    Standing Expiration Date:   04/26/2024   Immunofixation electrophoresis    Standing Status:   Future    Standing Expiration Date:   04/26/2024   Protein electrophoresis, serum    Standing Status:   Future    Standing Expiration Date:   04/26/2024   PSA    Standing Status:   Future    Standing Expiration Date:   04/26/2024      I,[name],acting as a scribe for Doreatha Massed, MD.,have documented all relevant documentation on the behalf of Doreatha Massed, MD,as directed by  Doreatha Massed, MD while in the presence of Doreatha Massed, MD.   I, Doreatha Massed MD, have reviewed the above documentation for accuracy and completeness, and I agree with the above.   Doreatha Massed, MD   5/6/20247:24 PM  CHIEF COMPLAINT:   Diagnosis: prostate cancer and multiple myeloma    Cancer Staging  Multiple myeloma (HCC) Staging form: Plasma Cell Myeloma and Plasma Cell Disorders, AJCC 8th Edition - Clinical: No stage assigned - Unsigned    Prior Therapy: Carfilzomib (20/70) + Daratumumab SQ + Dexamethasone (20/40) DaraKRd q28d   Current Therapy:  Eligard; Revlimid 15 mg 3 weeks on/1 week of    HISTORY OF PRESENT ILLNESS:   Oncology History  Multiple myeloma (HCC)  04/09/2022 Initial Diagnosis   Multiple myeloma (HCC)   04/15/2022 - 11/11/2022 Chemotherapy   Patient is on Treatment Plan : MYELOMA RELAPSED/REFRACTORY Carfilzomib (20/70) + Daratumumab SQ + Dexamethasone (20/40) DaraKd q28d        INTERVAL HISTORY:   Luis Stewart is a 72 y.o. male presenting to clinic today for follow up of prostate cancer and multiple myeloma. He was last seen by me on 03/26/23.  Today, he states that he is doing well overall. His appetite level is at 100%. His energy level is at 50%.  PAST MEDICAL HISTORY:   Past Medical History: Past Medical History:  Diagnosis Date  Arthritis    Asbestosis (HCC)    Closed fracture of distal end of fibula with tibia with routine healing 05/28/2020   COVID-19 09/03/2021   Cyst of kidney, acquired    Headache    History of fracture of leg    Right, childhood   Hx of migraine headaches    Hyperlipidemia    Neuropathy    PE (pulmonary thromboembolism) (HCC)    After knee surgery   Prostate cancer (HCC) 2015   Adenocarcinoma by biopsy    Surgical History: Past Surgical History:  Procedure Laterality Date   BIOPSY PROSTATE  2003 and 2005   COLONOSCOPY  11/28/2008   Dr. Rourk:internal hemorrhoids/tortus colon/ascending colon  polyps, tubular adenomas   COLONOSCOPY N/A 09/04/2014   Procedure: COLONOSCOPY;  Surgeon: Corbin Ade, MD;  Location: [facility];  Service: Endoscopy;  Laterality: N/A;  9:30   COLONOSCOPY N/A 07/17/2017   Procedure: COLONOSCOPY;  Surgeon: West Bali, MD;  Location: [facility];  Service: Endoscopy;  Laterality: N/A;   ESOPHAGOGASTRODUODENOSCOPY N/A 07/16/2017   Procedure: ESOPHAGOGASTRODUODENOSCOPY (EGD);  Surgeon: West Bali, MD;  Location: [facility];  Service: Endoscopy;  Laterality: N/A;   GIVENS CAPSULE STUDY  07/16/2017   Procedure: GIVENS CAPSULE STUDY;  Surgeon: West Bali, MD;  Location: [facility];  Service: Endoscopy;;   JOINT REPLACEMENT N/A    Phreesia 01/12/2021   POLYPECTOMY  07/17/2017   Procedure: POLYPECTOMY;  Surgeon: West Bali, MD;  Location: [facility];  Service: Endoscopy;;  cecal   PORTACATH PLACEMENT Left 04/18/2022   Procedure: INSERTION PORT-A-CATH;  Surgeon: Franky Macho, MD;  Location: [facility];  Service: General;  Laterality: Left;   TOTAL KNEE ARTHROPLASTY Right 06/10/2017   TOTAL KNEE ARTHROPLASTY Right 06/10/2017   Procedure: TOTAL KNEE ARTHROPLASTY;  Surgeon: Loreta Ave, MD;  Location: [facility] OR;  Service: Orthopedics;  Laterality: Right;    Social History: Social History   Socioeconomic History   Marital status: Married    Spouse name: Not on file   Number of children: Not on file   Years of education: Not on file   Highest education level: Not on file  Occupational History   Occupation: Retired  Tobacco Use   Smoking status: Never   Smokeless tobacco: Never  Vaping Use   Vaping Use: Never used  Substance and Sexual Activity   Alcohol use: No   Drug use: No   Sexual activity: Yes  Other Topics Concern   Not on file  Social History Narrative   Not on file   Social Determinants of Health   Financial Resource Strain: Low Risk  (02/09/2023)   Overall Financial Resource Strain (CARDIA)    Difficulty of  Paying Living Expenses: Not hard at all  Food Insecurity: No Food Insecurity (02/09/2023)   Hunger Vital Sign    Worried About Running Out of Food in the Last Year: Never true    Ran Out of Food in the Last Year: Never true  Transportation Needs: No Transportation Needs (02/09/2023)   PRAPARE - Administrator, Civil Service (Medical): No    Lack of Transportation (Non-Medical): No  Physical Activity: Insufficiently Active (02/09/2023)   Exercise Vital Sign    Days of Exercise per Week: 5 days    Minutes of Exercise per Session: 20 min  Stress: No Stress Concern Present (02/09/2023)   [employer] of Occupational Health - Occupational Stress Questionnaire    Feeling of  Stress : Not at all  Social Connections: Socially Integrated (02/09/2023)   Social Connection and Isolation Panel [NHANES]    Frequency of Communication with Friends and Family: More than three times a week    Frequency of Social Gatherings with Friends and Family: More than three times a week    Attends Religious Services: More than 4 times per year    Active Member of [employer] or Organizations: Yes    Attends Engineer, structural: More than 4 times per year    Marital Status: Married  Catering manager Violence: Not At Risk (02/09/2023)   Humiliation, Afraid, Rape, and Kick questionnaire    Fear of Current or Ex-Partner: No    Emotionally Abused: No    Physically Abused: No    Sexually Abused: No    Family History: Family History  Problem Relation Age of Onset   Diabetes Mother    Hypertension Mother    Obesity Mother    Heart disease Mother    Mental illness Mother    Hypertension Sister    Obesity Sister    Arthritis Sister    Deep vein thrombosis Father    Dementia Father    Heart disease Father        CABG   Colon cancer Neg Hx     Current Medications:  Current Outpatient Medications:    acetaminophen (TYLENOL) 500 MG tablet, Take 1,000 mg by mouth every 6 (six) hours as needed  for mild pain or moderate pain., Disp: , Rfl:    acyclovir (ZOVIRAX) 400 MG tablet, TAKE 1 TABLET BY MOUTH TWICE A DAY, Disp: 180 tablet, Rfl: 2   amoxicillin-clavulanate (AUGMENTIN) 875-125 MG tablet, Take 1 tablet by mouth 2 (two) times daily., Disp: 14 tablet, Rfl: 0   aspirin EC 81 MG tablet, Take 81 mg by mouth daily. Swallow whole., Disp: , Rfl:    Carfilzomib (KYPROLIS IV), Inject into the vein once a week., Disp: , Rfl:    cetirizine (ZYRTEC) 5 MG tablet, Take 5 mg by mouth daily., Disp: , Rfl:    Cholecalciferol (VITAMIN D) 2000 units CAPS, Take 2,000 Units by mouth every other day., Disp: , Rfl:    daratumumab-hyaluronidase-fihj (DARZALEX FASPRO) 1800-30000 MG-UT/15ML SOLN, Inject 1,800 mg into the skin once a week., Disp: , Rfl:    hydrOXYzine (ATARAX) 10 MG tablet, TAKE 1 TABLET BY MOUTH EVERY 6 HOURS AS NEEDED FOR ITCHING, Disp: 360 tablet, Rfl: 1   lidocaine-prilocaine (EMLA) cream, Apply a small amount to port a cath site and cover with plastic wrap (do not rub in) 1 hour prior to infusion appointments, Disp: 30 g, Rfl: 0   methocarbamol (ROBAXIN) 500 MG tablet, Take 1 tablet (500 mg total) by mouth 3 (three) times daily., Disp: 60 tablet, Rfl: 1   Multiple Vitamin (MULTIVITAMIN WITH MINERALS) TABS tablet, Take 1 tablet by mouth daily with breakfast. Centrum Silver for Men, Disp: , Rfl:    nitroGLYCERIN (NITROSTAT) 0.4 MG SL tablet, Place 1 tablet (0.4 mg total) under the tongue every 5 (five) minutes x 3 doses as needed for chest pain (If no relief after 3rd dose, call or go to ED.)., Disp: 30 tablet, Rfl: 0   pantoprazole (PROTONIX) 40 MG tablet, TAKE 1 TABLET BY MOUTH EVERY OTHER DAY, Disp: 45 tablet, Rfl: 1   pregabalin (LYRICA) 50 MG capsule, Take 1 capsule (50 mg total) by mouth daily., Disp: 30 capsule, Rfl: 5   prochlorperazine (COMPAZINE) 10 MG tablet, Take 1 tablet (  10 mg total) by mouth every 6 (six) hours as needed for nausea or vomiting., Disp: 30 tablet, Rfl: 3    tamsulosin (FLOMAX) 0.4 MG CAPS capsule, Take 1 capsule (0.4 mg total) by mouth daily., Disp: 30 capsule, Rfl: 11   lenalidomide (REVLIMID) 15 MG capsule, Take 1 capsule (15 mg total) by mouth daily. Take for 21 days, then hold for 7 days. Repeat every 28 days., Disp: 21 capsule, Rfl: 0   Allergies: Allergies  Allergen Reactions   Sulfa Antibiotics Other (See Comments)    Unknown reaction. Childhood reaction    REVIEW OF SYSTEMS:   Review of Systems  Constitutional:  Negative for chills, fatigue and fever.  HENT:   Negative for lump/mass, mouth sores, nosebleeds, sore throat and trouble swallowing.        Sinus drainage with yellowish secretions  Eyes:  Negative for eye problems.  Respiratory:  Negative for cough and shortness of breath.   Cardiovascular:  Negative for chest pain, leg swelling and palpitations.  Gastrointestinal:  Negative for abdominal pain, constipation, diarrhea, nausea and vomiting.  Genitourinary:  Negative for bladder incontinence, difficulty urinating, dysuria, frequency, hematuria and nocturia.   Musculoskeletal:  Negative for arthralgias, back pain, flank pain, myalgias and neck pain.  Skin:  Negative for itching and rash.  Neurological:  Positive for numbness. Negative for dizziness and headaches.  Hematological:  Does not bruise/bleed easily.  Psychiatric/Behavioral:  Negative for depression, sleep disturbance and suicidal ideas. The patient is not nervous/anxious.   All other systems reviewed and are negative.    VITALS:   Blood pressure 122/78, pulse 68, temperature 97.8 F (36.6 C), temperature source Oral, resp. rate 16, weight 200 lb (90.7 kg), SpO2 98 %.  Wt Readings from Last 3 Encounters:  04/27/23 200 lb (90.7 kg)  03/26/23 199 lb 12.8 oz (90.6 kg)  02/26/23 197 lb 9.6 oz (89.6 kg)    Body mass index is 25.68 kg/m.  Performance status (ECOG): 1 - Symptomatic but completely ambulatory  PHYSICAL EXAM:   Physical Exam Vitals and nursing  note reviewed. Exam conducted with a chaperone present.  Constitutional:      Appearance: Normal appearance.  Cardiovascular:     Rate and Rhythm: Normal rate and regular rhythm.     Pulses: Normal pulses.     Heart sounds: Normal heart sounds.  Pulmonary:     Effort: Pulmonary effort is normal.     Breath sounds: Normal breath sounds.  Abdominal:     Palpations: Abdomen is soft. There is no hepatomegaly, splenomegaly or mass.     Tenderness: There is no abdominal tenderness.  Musculoskeletal:     Right lower leg: No edema.     Left lower leg: No edema.  Lymphadenopathy:     Cervical: No cervical adenopathy.     Right cervical: No superficial, deep or posterior cervical adenopathy.    Left cervical: No superficial, deep or posterior cervical adenopathy.     Upper Body:     Right upper body: No supraclavicular or axillary adenopathy.     Left upper body: No supraclavicular or axillary adenopathy.  Neurological:     General: No focal deficit present.     Mental Status: He is alert and oriented to person, place, and time.  Psychiatric:        Mood and Affect: Mood normal.        Behavior: Behavior normal.     LABS:      Latest Ref Rng &  Units 04/20/2023    1:45 PM 03/19/2023   10:30 AM 02/26/2023   11:13 AM  CBC  WBC 4.0 - 10.5 K/uL 5.0  2.8  3.0   Hemoglobin 13.0 - 17.0 g/dL 16.1  09.6  04.5   Hematocrit 39.0 - 52.0 % 33.6  31.9  33.2   Platelets 150 - 400 K/uL 167  194  206       Latest Ref Rng & Units 04/20/2023    1:45 PM 03/19/2023   10:30 AM 02/26/2023   11:13 AM  CMP  Glucose 70 - 99 mg/dL 96  95  95   BUN 8 - 23 mg/dL 23  19  22    Creatinine 0.61 - 1.24 mg/dL 4.09  8.11  9.14   Sodium 135 - 145 mmol/L 139  140  139   Potassium 3.5 - 5.1 mmol/L 4.0  3.7  4.0   Chloride 98 - 111 mmol/L 107  108  107   CO2 22 - 32 mmol/L 25  25  25    Calcium 8.9 - 10.3 mg/dL 8.6  8.8  8.9   Total Protein 6.5 - 8.1 g/dL 5.8  5.8  5.9   Total Bilirubin 0.3 - 1.2 mg/dL 0.7  0.6  0.6    Alkaline Phos 38 - 126 U/L 33  35  41   AST 15 - 41 U/L 21  22  22    ALT 0 - 44 U/L 25  24  21       No results found for: "CEA1", "CEA" / No results found for: "CEA1", "CEA" Lab Results  Component Value Date   PSA1 22.5 (H) 11/05/2021   No results found for: "NWG956" No results found for: "CAN125"  Lab Results  Component Value Date   TOTALPROTELP 5.3 (L) 04/20/2023   ALBUMINELP 3.3 04/20/2023   A1GS 0.2 04/20/2023   A2GS 0.5 04/20/2023   BETS 0.9 04/20/2023   GAMS 0.3 (L) 04/20/2023   MSPIKE 0.1 (H) 04/20/2023   SPEI Comment 04/20/2023   Lab Results  Component Value Date   TIBC 301 02/26/2022   TIBC 262 07/15/2017   FERRITIN 168 02/26/2022   FERRITIN 145 07/15/2017   IRONPCTSAT 42 (H) 02/26/2022   IRONPCTSAT 15 (L) 07/15/2017   Lab Results  Component Value Date   LDH 142 10/21/2022   LDH 131 08/26/2022   LDH 124 07/29/2022     STUDIES:   No results found.

## 2023-04-27 ENCOUNTER — Inpatient Hospital Stay: Payer: Medicare HMO | Attending: Hematology | Admitting: Hematology

## 2023-04-27 ENCOUNTER — Inpatient Hospital Stay: Payer: Medicare HMO

## 2023-04-27 ENCOUNTER — Encounter: Payer: Self-pay | Admitting: Hematology

## 2023-04-27 DIAGNOSIS — M8580 Other specified disorders of bone density and structure, unspecified site: Secondary | ICD-10-CM | POA: Insufficient documentation

## 2023-04-27 DIAGNOSIS — C61 Malignant neoplasm of prostate: Secondary | ICD-10-CM

## 2023-04-27 DIAGNOSIS — C7951 Secondary malignant neoplasm of bone: Secondary | ICD-10-CM | POA: Diagnosis not present

## 2023-04-27 DIAGNOSIS — C9000 Multiple myeloma not having achieved remission: Secondary | ICD-10-CM

## 2023-04-27 MED ORDER — LENALIDOMIDE 15 MG PO CAPS
15.0000 mg | ORAL_CAPSULE | Freq: Every day | ORAL | 0 refills | Status: DC
Start: 2023-04-27 — End: 2023-06-01

## 2023-04-27 MED ORDER — DENOSUMAB 120 MG/1.7ML ~~LOC~~ SOLN
120.0000 mg | Freq: Once | SUBCUTANEOUS | Status: AC
  Administered 2023-04-27: 120 mg via SUBCUTANEOUS
  Filled 2023-04-27: qty 1.7

## 2023-04-27 MED ORDER — AMOXICILLIN-POT CLAVULANATE 875-125 MG PO TABS: 1.0000 | ORAL_TABLET | Freq: Two times a day (BID) | ORAL | 0 refills | Status: DC

## 2023-04-27 NOTE — Progress Notes (Signed)
Patient taking calcium as directed. Denied tooth, jaw, and leg pain. No recent or upcoming dental visits. Labs reviewed. Patient tolerated injection with no complaints voiced. See MAR for details. Patient stable during and after injection. Site clean and dry with no bruising or swelling noted. Band aid applied. Vss with discharge and left in satisfactory condition with no s/s of distress.   

## 2023-04-27 NOTE — Patient Instructions (Addendum)
[facility]  Discharge Instructions  You were seen and examined today by Dr. Ellin Saba.  Restart Revlimid.  Your Myeloma labs are stable. Your PSA is trending up, but is still within the normal range. Follow-up with urology regarding this.  You will get Xgeva today.  Follow-up as scheduled.  Thank you for choosing [facility] to provide your oncology and hematology care.   To afford each patient quality time with our provider, please arrive at least 15 minutes before your scheduled appointment time. You may need to reschedule your appointment if you arrive late (10 or more minutes). Arriving late affects you and other patients whose appointments are after yours.  Also, if you miss three or more appointments without notifying the office, you may be dismissed from the clinic at the provider's discretion.    Again, thank you for choosing [facility].  Our hope is that these requests will decrease the amount of time that you wait before being seen by our physicians.   If you have a lab appointment with the Cancer Center - please note that after April 8th, all labs will be drawn in the cancer center.  You do not have to check in or register with the main entrance as you have in the past but will complete your check-in at the cancer center.            _____________________________________________________________  Should you have questions after your visit to [facility], please contact our office at [phone] and follow the prompts.  Our office hours are 8:00 a.m. to 4:30 p.m. Monday - Thursday and 8:00 a.m. to 2:30 p.m. Friday.  Please note that voicemails left after 4:00 p.m. may not be returned until the following business day.  We are closed weekends and all major holidays.  You do have access to a nurse 24-7, just call the main number to the clinic [phone] and do not press any options, hold on the line  and a nurse will answer the phone.    For prescription refill requests, have your pharmacy contact our office and allow 72 hours.    Masks are no longer required in the cancer centers. If you would like for your care team to wear a mask while they are taking care of you, please let them know. You may have one support person who is at least 73 years old accompany you for your appointments.

## 2023-04-27 NOTE — Patient Instructions (Signed)
[facility] [place]  Discharge Instructions: Thank you for choosing [facility] to provide your oncology and hematology care.  If you have a lab appointment with the Cancer Center - please note that after April 8th, 2024, all labs will be drawn in the cancer center.  You do not have to check in or register with the main entrance as you have in the past but will complete your check-in in the cancer center.  Wear comfortable clothing and clothing appropriate for easy access to any Portacath or PICC line.   We strive to give you quality time with your provider. You may need to reschedule your appointment if you arrive late (15 or more minutes).  Arriving late affects you and other patients whose appointments are after yours.  Also, if you miss three or more appointments without notifying the office, you may be dismissed from the clinic at the provider's discretion.      For prescription refill requests, have your pharmacy contact our office and allow 72 hours for refills to be completed.    Today you received the following Xgeva, return as scheduled.   To help prevent nausea and vomiting after your treatment, we encourage you to take your nausea medication as directed.  BELOW ARE SYMPTOMS THAT SHOULD BE REPORTED IMMEDIATELY: *FEVER GREATER THAN 100.4 F (38 C) OR HIGHER *CHILLS OR SWEATING *NAUSEA AND VOMITING THAT IS NOT CONTROLLED WITH YOUR NAUSEA MEDICATION *UNUSUAL SHORTNESS OF BREATH *UNUSUAL BRUISING OR BLEEDING *URINARY PROBLEMS (pain or burning when urinating, or frequent urination) *BOWEL PROBLEMS (unusual diarrhea, constipation, pain near the anus) TENDERNESS IN MOUTH AND THROAT WITH OR WITHOUT PRESENCE OF ULCERS (sore throat, sores in mouth, or a toothache) UNUSUAL RASH, SWELLING OR PAIN  UNUSUAL VAGINAL DISCHARGE OR ITCHING   Items with * indicate a potential emergency and should be followed up as soon as possible or go to the Emergency Department if any  problems should occur.  Please show the CHEMOTHERAPY ALERT CARD or IMMUNOTHERAPY ALERT CARD at check-in to the Emergency Department and triage nurse.  Should you have questions after your visit or need to cancel or reschedule your appointment, please contact [facility] [place] [phone]  and follow the prompts.  Office hours are 8:00 a.m. to 4:30 p.m. Monday - Friday. Please note that voicemails left after 4:00 p.m. may not be returned until the following business day.  We are closed weekends and major holidays. You have access to a nurse at all times for urgent questions. Please call the main number to the clinic [phone] and follow the prompts.  For any non-urgent questions, you may also contact your provider using MyChart. We now offer e-Visits for anyone 18 and older to request care online for non-urgent symptoms. For details visit [email or web address].   Also download the MyChart app! Go to the app store, search "MyChart", open the app, select [place], and log in with your MyChart username and password.   

## 2023-04-28 ENCOUNTER — Ambulatory Visit: Payer: Medicare HMO | Admitting: Urology

## 2023-04-28 ENCOUNTER — Encounter: Payer: Self-pay | Admitting: Urology

## 2023-04-28 VITALS — BP 127/74 | HR 69 | Ht 74.0 in | Wt 200.0 lb

## 2023-04-28 DIAGNOSIS — N401 Enlarged prostate with lower urinary tract symptoms: Secondary | ICD-10-CM | POA: Diagnosis not present

## 2023-04-28 DIAGNOSIS — Z8546 Personal history of malignant neoplasm of prostate: Secondary | ICD-10-CM

## 2023-04-28 DIAGNOSIS — R350 Frequency of micturition: Secondary | ICD-10-CM

## 2023-04-28 MED ORDER — TAMSULOSIN HCL 0.4 MG PO CAPS: 0.4000 mg | ORAL_CAPSULE | Freq: Every day | ORAL | 11 refills | Status: DC

## 2023-04-28 MED ORDER — TAMSULOSIN HCL 0.4 MG PO CAPS
0.4000 mg | ORAL_CAPSULE | Freq: Every day | ORAL | 3 refills | Status: DC
Start: 2023-04-28 — End: 2023-06-03

## 2023-04-29 LAB — URINALYSIS, ROUTINE W REFLEX MICROSCOPIC
Bilirubin, UA: NEGATIVE
Glucose, UA: NEGATIVE
Ketones, UA: NEGATIVE
Leukocytes,UA: NEGATIVE
Nitrite, UA: NEGATIVE
Protein,UA: NEGATIVE
Specific Gravity, UA: 1.015 (ref 1.005–1.030)
Urobilinogen, Ur: 1 mg/dL (ref 0.2–1.0)
pH, UA: 6 (ref 5.0–7.5)

## 2023-04-29 LAB — MICROSCOPIC EXAMINATION: Bacteria, UA: NONE SEEN

## 2023-05-05 ENCOUNTER — Telehealth: Payer: Self-pay

## 2023-05-05 ENCOUNTER — Ambulatory Visit (INDEPENDENT_AMBULATORY_CARE_PROVIDER_SITE_OTHER): Payer: Medicare HMO

## 2023-05-05 DIAGNOSIS — C61 Malignant neoplasm of prostate: Secondary | ICD-10-CM

## 2023-05-05 DIAGNOSIS — Z87898 Personal history of other specified conditions: Secondary | ICD-10-CM

## 2023-05-05 DIAGNOSIS — R350 Frequency of micturition: Secondary | ICD-10-CM | POA: Diagnosis not present

## 2023-05-05 DIAGNOSIS — N401 Enlarged prostate with lower urinary tract symptoms: Secondary | ICD-10-CM

## 2023-05-05 NOTE — Progress Notes (Cosign Needed Addendum)
Patient presents today with complaints of  checked for infection.  UA  done today.  Dr. Retta Diones reviewed results and no treatment needed .  Patient aware of MD recommendations.  ZOXWRUEA, CMA

## 2023-05-05 NOTE — Telephone Encounter (Signed)
Patient left a urine specimen. He wanted to have urine checked for infection.  Thank you.

## 2023-05-20 ENCOUNTER — Telehealth: Payer: Self-pay | Admitting: *Deleted

## 2023-05-20 NOTE — Progress Notes (Signed)
  Care Coordination  Outreach Note  05/20/2023 Name: Nyles Aldridge MRN: [id number] DOB: [birth year]   Care Coordination Outreach Attempts: An unsuccessful telephone outreach was attempted today to offer the patient information about available care coordination services.  Follow Up Plan:  Additional outreach attempts will be made to offer the patient care coordination information and services.   Encounter Outcome:  Pt. Request to Call Back  [facility] Coordination Care Guide  Direct Dial: [phone]

## 2023-05-21 ENCOUNTER — Other Ambulatory Visit: Payer: Medicare HMO

## 2023-05-22 NOTE — Progress Notes (Unsigned)
  Care Coordination  Outreach Note  05/22/2023 Name: Khris Frischman MRN: [id number] DOB: December 18, 1951   Care Coordination Outreach Attempts: A second unsuccessful outreach was attempted today to offer the patient with information about available care coordination services.  Follow Up Plan:  Additional outreach attempts will be made to offer the patient care coordination information and services.   Encounter Outcome:  No Answer  Christie Nottingham  Care Coordination Care Guide  Direct Dial: [phone]

## 2023-05-28 ENCOUNTER — Inpatient Hospital Stay: Payer: Medicare HMO | Attending: Hematology

## 2023-05-28 ENCOUNTER — Inpatient Hospital Stay: Payer: Medicare HMO

## 2023-05-28 ENCOUNTER — Ambulatory Visit: Payer: Medicare HMO | Admitting: Hematology

## 2023-05-28 VITALS — BP 125/78 | HR 58 | Temp 97.5°F | Resp 17

## 2023-05-28 DIAGNOSIS — M8580 Other specified disorders of bone density and structure, unspecified site: Secondary | ICD-10-CM | POA: Diagnosis not present

## 2023-05-28 DIAGNOSIS — C9000 Multiple myeloma not having achieved remission: Secondary | ICD-10-CM | POA: Insufficient documentation

## 2023-05-28 DIAGNOSIS — C7951 Secondary malignant neoplasm of bone: Secondary | ICD-10-CM | POA: Diagnosis not present

## 2023-05-28 DIAGNOSIS — Z95828 Presence of other vascular implants and grafts: Secondary | ICD-10-CM

## 2023-05-28 DIAGNOSIS — D649 Anemia, unspecified: Secondary | ICD-10-CM | POA: Insufficient documentation

## 2023-05-28 DIAGNOSIS — C61 Malignant neoplasm of prostate: Secondary | ICD-10-CM | POA: Diagnosis not present

## 2023-05-28 LAB — COMPREHENSIVE METABOLIC PANEL
ALT: 28 U/L (ref 0–44)
AST: 27 U/L (ref 15–41)
Albumin: 3.7 g/dL (ref 3.5–5.0)
Alkaline Phosphatase: 36 U/L — ABNORMAL LOW (ref 38–126)
Anion gap: 8 (ref 5–15)
BUN: 26 mg/dL — ABNORMAL HIGH (ref 8–23)
CO2: 27 mmol/L (ref 22–32)
Calcium: 8.8 mg/dL — ABNORMAL LOW (ref 8.9–10.3)
Chloride: 105 mmol/L (ref 98–111)
Creatinine, Ser: 1.38 mg/dL — ABNORMAL HIGH (ref 0.61–1.24)
GFR, Estimated: 54 mL/min — ABNORMAL LOW (ref >60)
Glucose, Bld: 93 mg/dL (ref 70–99)
Potassium: 3.9 mmol/L (ref 3.5–5.1)
Sodium: 140 mmol/L (ref 135–145)
Total Bilirubin: 0.8 mg/dL (ref 0.3–1.2)
Total Protein: 5.9 g/dL — ABNORMAL LOW (ref 6.5–8.1)

## 2023-05-28 LAB — CBC WITH DIFFERENTIAL/PLATELET
Abs Immature Granulocytes: 0 10*3/uL (ref 0.00–0.07)
Basophils Absolute: 0 10*3/uL (ref 0.0–0.1)
Basophils Relative: 1 %
Eosinophils Absolute: 0.1 10*3/uL (ref 0.0–0.5)
Eosinophils Relative: 4 %
HCT: 33.1 % — ABNORMAL LOW (ref 39.0–52.0)
Hemoglobin: 11.1 g/dL — ABNORMAL LOW (ref 13.0–17.0)
Immature Granulocytes: 0 %
Lymphocytes Relative: 16 %
Lymphs Abs: 0.6 10*3/uL — ABNORMAL LOW (ref 0.7–4.0)
MCH: 29.6 pg (ref 26.0–34.0)
MCHC: 33.5 g/dL (ref 30.0–36.0)
MCV: 88.3 fL (ref 80.0–100.0)
Monocytes Absolute: 0.4 10*3/uL (ref 0.1–1.0)
Monocytes Relative: 12 %
Neutro Abs: 2.2 10*3/uL (ref 1.7–7.7)
Neutrophils Relative %: 67 %
Platelets: 188 10*3/uL (ref 150–400)
RBC: 3.75 MIL/uL — ABNORMAL LOW (ref 4.22–5.81)
RDW: 17.5 % — ABNORMAL HIGH (ref 11.5–15.5)
WBC: 3.4 10*3/uL — ABNORMAL LOW (ref 4.0–10.5)
nRBC: 0 % (ref 0.0–0.2)

## 2023-05-28 LAB — PSA: Prostatic Specific Antigen: 1.28 ng/mL (ref 0.00–4.00)

## 2023-05-28 MED ORDER — HEPARIN SOD (PORK) LOCK FLUSH 100 UNIT/ML IV SOLN
500.0000 [IU] | Freq: Once | INTRAVENOUS | Status: AC
  Administered 2023-05-28: 500 [IU] via INTRAVENOUS

## 2023-05-28 MED ORDER — SODIUM CHLORIDE 0.9% FLUSH
10.0000 mL | INTRAVENOUS | Status: DC | PRN
  Administered 2023-05-28: 10 mL via INTRAVENOUS

## 2023-05-28 MED ORDER — DENOSUMAB 120 MG/1.7ML ~~LOC~~ SOLN
120.0000 mg | Freq: Once | SUBCUTANEOUS | Status: AC
  Administered 2023-05-28: 120 mg via SUBCUTANEOUS
  Filled 2023-05-28: qty 1.7

## 2023-05-28 NOTE — Progress Notes (Signed)
  Care Coordination   Note   05/28/2023 Name: Luis Stewart MRN: [id number] DOB: Feb 12, 1951  Luis Stewart is a 72 y.o. year old male who sees [name], MD for primary care. I reached out to [name] by phone today to offer care coordination services.  [name] was given information about Care Coordination services today including:   The Care Coordination services include support from the care team which includes your Nurse Coordinator, Clinical Social Worker, or Pharmacist.  The Care Coordination team is here to help remove barriers to the health concerns and goals most important to you. Care Coordination services are voluntary, and the patient may decline or stop services at any time by request to their care team member.   Care Coordination Consent Status: Patient agreed to services and verbal consent obtained.   Follow up plan:  Telephone appointment with care coordination team member scheduled for:  06/01/23  Encounter Outcome:  Pt. Scheduled  [facility] Coordination Care Guide  Direct Dial: [phone]

## 2023-05-28 NOTE — Progress Notes (Signed)
Patient taking calcium as directed. Denied tooth, jaw, and leg pain. No recent or upcoming dental visits. Labs reviewed. Patient tolerated injection with no complaints voiced. See MAR for details. Patient stable during and after injection. Site clean and dry with no bruising or swelling noted. Band aid applied. Vss with discharge and left in satisfactory condition with no s/s of distress.   

## 2023-05-28 NOTE — Patient Instructions (Signed)
[facility] [place]  Discharge Instructions: Thank you for choosing [facility] to provide your oncology and hematology care.  If you have a lab appointment with the Cancer Center - please note that after April 8th, 2024, all labs will be drawn in the cancer center.  You do not have to check in or register with the main entrance as you have in the past but will complete your check-in in the cancer center.  Wear comfortable clothing and clothing appropriate for easy access to any Portacath or PICC line.   We strive to give you quality time with your provider. You may need to reschedule your appointment if you arrive late (15 or more minutes).  Arriving late affects you and other patients whose appointments are after yours.  Also, if you miss three or more appointments without notifying the office, you may be dismissed from the clinic at the provider's discretion.      For prescription refill requests, have your pharmacy contact our office and allow 72 hours for refills to be completed.    Today you received the following Xgeva, return as scheduled.   To help prevent nausea and vomiting after your treatment, we encourage you to take your nausea medication as directed.  BELOW ARE SYMPTOMS THAT SHOULD BE REPORTED IMMEDIATELY: *FEVER GREATER THAN 100.4 F (38 C) OR HIGHER *CHILLS OR SWEATING *NAUSEA AND VOMITING THAT IS NOT CONTROLLED WITH YOUR NAUSEA MEDICATION *UNUSUAL SHORTNESS OF BREATH *UNUSUAL BRUISING OR BLEEDING *URINARY PROBLEMS (pain or burning when urinating, or frequent urination) *BOWEL PROBLEMS (unusual diarrhea, constipation, pain near the anus) TENDERNESS IN MOUTH AND THROAT WITH OR WITHOUT PRESENCE OF ULCERS (sore throat, sores in mouth, or a toothache) UNUSUAL RASH, SWELLING OR PAIN  UNUSUAL VAGINAL DISCHARGE OR ITCHING   Items with * indicate a potential emergency and should be followed up as soon as possible or go to the Emergency Department if any  problems should occur.  Please show the CHEMOTHERAPY ALERT CARD or IMMUNOTHERAPY ALERT CARD at check-in to the Emergency Department and triage nurse.  Should you have questions after your visit or need to cancel or reschedule your appointment, please contact [facility] [place] [phone]  and follow the prompts.  Office hours are 8:00 a.m. to 4:30 p.m. Monday - Friday. Please note that voicemails left after 4:00 p.m. may not be returned until the following business day.  We are closed weekends and major holidays. You have access to a nurse at all times for urgent questions. Please call the main number to the clinic [phone] and follow the prompts.  For any non-urgent questions, you may also contact your provider using MyChart. We now offer e-Visits for anyone 18 and older to request care online for non-urgent symptoms. For details visit [email or web address].   Also download the MyChart app! Go to the app store, search "MyChart", open the app, select [place], and log in with your MyChart username and password.   

## 2023-05-28 NOTE — Progress Notes (Signed)
Patients port flushed without difficulty.  Good blood return noted with no bruising or swelling noted at site.  Band aid applied.  VSS. Patient waiting in waiting room for injection time.

## 2023-05-29 LAB — KAPPA/LAMBDA LIGHT CHAINS
Kappa free light chain: 5.5 mg/L (ref 3.3–19.4)
Kappa, lambda light chain ratio: 1.9 — ABNORMAL HIGH (ref 0.26–1.65)
Lambda free light chains: 2.9 mg/L — ABNORMAL LOW (ref 5.7–26.3)

## 2023-06-01 ENCOUNTER — Encounter: Payer: Self-pay | Admitting: *Deleted

## 2023-06-01 ENCOUNTER — Other Ambulatory Visit: Payer: Self-pay

## 2023-06-01 ENCOUNTER — Ambulatory Visit: Payer: Self-pay | Admitting: *Deleted

## 2023-06-01 DIAGNOSIS — C9000 Multiple myeloma not having achieved remission: Secondary | ICD-10-CM

## 2023-06-01 MED ORDER — LENALIDOMIDE 15 MG PO CAPS
15.0000 mg | ORAL_CAPSULE | Freq: Every day | ORAL | 0 refills | Status: DC
Start: 2023-06-01 — End: 2023-06-10

## 2023-06-01 NOTE — Telephone Encounter (Signed)
Chart reviewed. Revlimid refilled per last office note with Dr. Katragadda.  

## 2023-06-01 NOTE — Patient Instructions (Signed)
Visit Information  Thank you for taking time to visit with me today. Please don't hesitate to contact me if I can be of assistance to you.   [name], HAD TO CHANGED THE APPOINTMENT TIME FROM 0900 TO 1000 AS I FORGOT OUR STANDING 9 AM THN MEETING    Following are the goals we discussed today:   Goals Addressed             This Visit's Progress    [facility] care coordination services (multiple myeloma, pain, prostate)   On track    Interventions Today    Flowsheet Row Most Recent Value  Chronic Disease   Chronic disease during today's visit Other  [multiple myeloma, Has back, legs, feet, pain, prostate concerns]  General Interventions   General Interventions Discussed/Reviewed General Interventions Discussed, Labs, Durable Medical Equipment (DME), Community Resources, Doctor Visits  Doctor Visits Discussed/Reviewed Doctor Visits Discussed, PCP, Specialist  PCP/Specialist Visits Compliance with follow-up visit  Exercise Interventions   Exercise Discussed/Reviewed Exercise Discussed, Physical Activity  Physical Activity Discussed/Reviewed Physical Activity Discussed, Home Exercise Program (HEP)  Education Interventions   Education Provided Provided Education  Provided Verbal Education On Labs, Mental Health/Coping with Illness, Medication, [employer], Insurance Plans  Luis Stewart transportation benefit, Luis Stewart resources]  Mental Health Interventions   Mental Health Discussed/Reviewed Mental Health Discussed, Depression, Coping Strategies  Pharmacy Interventions   Pharmacy Dicussed/Reviewed Pharmacy Topics Discussed, Medications and their functions  [lyrica, Neurontin discussed for neuropathy]              Our next appointment is by telephone on 08/20/23 at 1000  Please call the care guide team at [phone] if you need to cancel or reschedule your appointment.   If you are experiencing a Mental Health or Behavioral Health Crisis or need someone to talk to, please call  the Suicide and Crisis Lifeline: 988 call the [place] National Suicide Prevention Lifeline: (260)331-3793 or TTY: (219)513-2519 TTY 202-403-3946) to talk to a trained counselor call 1-800-273-TALK (toll free, 24 hour hotline) call the [facility]: ([phone]) [phone] call 911   Patient verbalizes understanding of instructions and care plan provided today and agrees to view in MyChart. Active MyChart status and patient understanding of how to access instructions and care plan via MyChart confirmed with patient.     The patient has been provided with contact information for the care management team and has been advised to call with any health related questions or concerns.   Luis Viernes L. Noelle Penner, RN, BSN, CCM [facility] Care Management Community Coordinator Office number [phone]

## 2023-06-01 NOTE — Patient Outreach (Signed)
  Care Coordination   Initial Visit Note   06/01/2023 Name: Luis Stewart MRN: [id number] DOB: [birth year]  Luis Stewart is a 72 y.o. year old male who sees [name], MD for primary care. I spoke with  Luis Stewart by phone today.  What matters to the patients health and wellness today?  Continue to see Dr Ellin Saba once a month for multiple myeloma, Has back, legs, feet, pain Pain not being manage by pain management. He prefers only to take tylenol that controls his pain. 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 Agrees to outreach every 2 months  Denies any social determinants of health (SDOH) needs   Goals Addressed             This Visit's Progress    [facility] care coordination services (multiple myeloma, pain, prostate)   On track    Interventions Today    Flowsheet Row Most Recent Value  Chronic Disease   Chronic disease during today's visit Other  [multiple myeloma, Has back, legs, feet, pain, prostate concerns]  General Interventions   General Interventions Discussed/Reviewed General Interventions Discussed, Labs, Durable Medical Equipment (DME), Community Resources, Doctor Visits  Doctor Visits Discussed/Reviewed Doctor Visits Discussed, PCP, Specialist  PCP/Specialist Visits Compliance with follow-up visit  Exercise Interventions   Exercise Discussed/Reviewed Exercise Discussed, Physical Activity  Physical Activity Discussed/Reviewed Physical Activity Discussed, Home Exercise Program (HEP)  Education  Interventions   Education Provided Provided Education  Provided Verbal Education On Labs, Mental Health/Coping with Illness, Medication, [employer], Development worker, community  [place] transportation benefit, [facility] resources]  Mental Health Interventions   Mental Health Discussed/Reviewed Mental Health Discussed, Depression, Coping Strategies  Pharmacy Interventions   Pharmacy Dicussed/Reviewed Pharmacy Topics Discussed, Medications and their functions  [lyrica, Neurontin discussed for neuropathy]              SDOH assessments and interventions completed:  Yes  SDOH Interventions Today    Flowsheet Row Most Recent Value  SDOH Interventions   Food Insecurity Interventions Intervention Not Indicated  Housing Interventions Intervention Not Indicated  Transportation Interventions Intervention Not Indicated  Utilities Interventions Intervention Not Indicated  Financial Strain Interventions Intervention Not Indicated  Stress Interventions Intervention Not Indicated  Social Connections Interventions Intervention Not Indicated        Care Coordination Interventions:  Yes, provided   Follow up plan: Follow up call scheduled for 08/20/23    Encounter Outcome:  Pt. Visit Completed    Goble Fudala L. Noelle Penner, RN, BSN, CCM [facility] Care Management Community Coordinator Office number [phone]

## 2023-06-02 LAB — PROTEIN ELECTROPHORESIS, SERUM
A/G Ratio: 1.7 (ref 0.7–1.7)
Albumin ELP: 3.4 g/dL (ref 2.9–4.4)
Alpha-1-Globulin: 0.2 g/dL (ref 0.0–0.4)
Alpha-2-Globulin: 0.6 g/dL (ref 0.4–1.0)
Beta Globulin: 0.9 g/dL (ref 0.7–1.3)
Gamma Globulin: 0.3 g/dL — ABNORMAL LOW (ref 0.4–1.8)
Globulin, Total: 2 g/dL — ABNORMAL LOW (ref 2.2–3.9)
M-Spike, %: 0.1 g/dL — ABNORMAL HIGH
Total Protein ELP: 5.4 g/dL — ABNORMAL LOW (ref 6.0–8.5)

## 2023-06-03 ENCOUNTER — Ambulatory Visit: Payer: Medicare HMO | Attending: Cardiology | Admitting: Cardiology

## 2023-06-03 ENCOUNTER — Encounter: Payer: Self-pay | Admitting: Cardiology

## 2023-06-03 VITALS — BP 112/73 | HR 64 | Ht 74.0 in | Wt 200.0 lb

## 2023-06-03 DIAGNOSIS — E782 Mixed hyperlipidemia: Secondary | ICD-10-CM | POA: Diagnosis not present

## 2023-06-03 DIAGNOSIS — I700 Atherosclerosis of aorta: Secondary | ICD-10-CM | POA: Diagnosis not present

## 2023-06-03 DIAGNOSIS — R931 Abnormal findings on diagnostic imaging of heart and coronary circulation: Secondary | ICD-10-CM

## 2023-06-03 DIAGNOSIS — E785 Hyperlipidemia, unspecified: Secondary | ICD-10-CM

## 2023-06-03 NOTE — Progress Notes (Signed)
Cardiology Office Note  Date: 06/03/2023   ID: [name], DOB 1951/11/19, MRN [id number]  History of Present Illness: Luis Stewart is a 72 y.o. male last seen in November 2023 by Ms. Peck NP, I reviewed the note (our last visit was in 2018).  He is here today with his wife for a routine visit.  Reports no exertional chest pain to suggest angina, no increasing dyspnea on exertion with typical activities.  He has prostate cancer with follow-up per Dr. Retta Diones.  He also has IgG kappa multiple myeloma with follow-up by Dr. Ellin Saba and also at [facility].Marland Kitchen  He is being considered for possible stem cell transplantation eventually.  Prior cardiac testing history has been reassuring.  He had a calcium score of 0 by CT imaging in 2019.  Subsequent screening body CT imaging does show aortic atherosclerosis.  He had an LDL of 86 in February 2023.  Currently not on statin therapy.  I reviewed his medications.  Physical Exam: VS:  BP 112/73   Pulse 64   Ht 6\' 2"  (1.88 m)   Wt 200 lb (90.7 kg)   SpO2 97%   BMI 25.68 kg/m , BMI Body mass index is 25.68 kg/m.  Wt Readings from Last 3 Encounters:  06/03/23 200 lb (90.7 kg)  04/28/23 200 lb (90.7 kg)  04/27/23 200 lb (90.7 kg)    General: Patient appears comfortable at rest. HEENT: Conjunctiva and lids normal. Neck: Supple, no elevated JVP or carotid bruits. Lungs: Clear to auscultation, nonlabored breathing at rest. Cardiac: Regular rate and rhythm, no S3 or significant systolic murmur. Abdomen: Soft, bowel sounds present, no bruits.  ECG:  An ECG dated 11/30/2022 was personally reviewed today and demonstrated:  Ectopic atrial rhythm with incomplete right bundle branch block.  Labwork: 04/20/2023: Magnesium 1.9 05/28/2023: ALT 28; AST 27; BUN 26; Creatinine, Ser 1.38; Hemoglobin 11.1; Platelets 188; Potassium 3.9; Sodium 140     Component Value Date/Time   CHOL 137 02/06/2022 0940   TRIG 55  02/06/2022 0940   HDL 39 (L) 02/06/2022 0940   CHOLHDL 3.5 02/06/2022 0940   CHOLHDL 3.1 01/11/2021 0852   VLDL 14 10/15/2016 0906   LDLCALC 86 02/06/2022 0940   LDLCALC 95 01/11/2021 0852   Other Studies Reviewed Today:  Echocardiogram 04/24/2022:  1. Left ventricular ejection fraction, by estimation, is 60 to 65%. The  left ventricle has normal function. The left ventricle has no regional  wall motion abnormalities. There is mild left ventricular hypertrophy.  Left ventricular diastolic parameters  were normal.   2. Right ventricular systolic function is normal. The right ventricular  size is normal. There is normal pulmonary artery systolic pressure.   3. The mitral valve is normal in structure. Trivial mitral valve  regurgitation. No evidence of mitral stenosis.   4. The aortic valve is tricuspid. There is mild calcification of the  aortic valve. There is mild thickening of the aortic valve. Aortic valve  regurgitation is trivial.   5. The inferior vena cava is normal in size with greater than 50%  respiratory variability, suggesting right atrial pressure of 3 mmHg.   Assessment and Plan:  1.  Aortic atherosclerosis by CT imaging, asymptomatic.  He had a prior coronary calcium score of 0 in 2019 and reports no symptoms suggestive of angina.  LVEF was normal at 60 to 65% by echocardiogram last year.  Discussed statin therapy for risk reduction.  We will obtain a follow-up FLP and  guide therapy from there..  2.  Mixed hyperlipidemia, LDL 86 in February 2023.  Repeat FLP as discussed above and most likely consider initiation of Crestor 5 mg daily.  Disposition:  Follow up  1 year.  Signed, Jonelle Sidle, M.D., F.A.C.C. [facility]

## 2023-06-03 NOTE — Patient Instructions (Addendum)
Medication Instructions:  Your physician recommends that you continue on your current medications as directed. Please refer to the Current Medication list given to you today.  Labwork: Your physician recommends that you return for a FASTING lipid profile 06/04/2023. Please do not eat or drink for at least 8 hours when you have this done. You may take your medications that morning with a sip of water. [employer] ([address] [place])  Testing/Procedures: none  Follow-Up: Your physician recommends that you schedule a follow-up appointment in: 1 year. You will receive a reminder call in the mail in about 10 months reminding you to call and schedule your appointment. If you don't receive this call, please contact our office.  Any Other Special Instructions Will Be Listed Below (If Applicable).  If you need a refill on your cardiac medications before your next appointment, please call your pharmacy.

## 2023-06-04 ENCOUNTER — Inpatient Hospital Stay (HOSPITAL_BASED_OUTPATIENT_CLINIC_OR_DEPARTMENT_OTHER): Payer: Medicare HMO | Admitting: Hematology

## 2023-06-04 VITALS — BP 117/78 | HR 60 | Temp 98.3°F | Resp 18 | Ht 74.0 in | Wt 199.8 lb

## 2023-06-04 DIAGNOSIS — D649 Anemia, unspecified: Secondary | ICD-10-CM | POA: Diagnosis not present

## 2023-06-04 DIAGNOSIS — C9000 Multiple myeloma not having achieved remission: Secondary | ICD-10-CM

## 2023-06-04 DIAGNOSIS — C7951 Secondary malignant neoplasm of bone: Secondary | ICD-10-CM | POA: Diagnosis not present

## 2023-06-04 DIAGNOSIS — C61 Malignant neoplasm of prostate: Secondary | ICD-10-CM | POA: Diagnosis not present

## 2023-06-04 DIAGNOSIS — M8580 Other specified disorders of bone density and structure, unspecified site: Secondary | ICD-10-CM | POA: Diagnosis not present

## 2023-06-04 DIAGNOSIS — E785 Hyperlipidemia, unspecified: Secondary | ICD-10-CM | POA: Diagnosis not present

## 2023-06-04 LAB — IMMUNOFIXATION ELECTROPHORESIS
IgA: 10 mg/dL — ABNORMAL LOW (ref 61–437)
IgG (Immunoglobin G), Serum: 357 mg/dL — ABNORMAL LOW (ref 603–1613)
IgM (Immunoglobulin M), Srm: 14 mg/dL — ABNORMAL LOW (ref 15–143)
Total Protein ELP: 5.5 g/dL — ABNORMAL LOW (ref 6.0–8.5)

## 2023-06-04 NOTE — Progress Notes (Signed)
[facility] [address], [place] [address]    Clinic Day:  06/04/2023  Referring physician: [name], MD  Patient Care Team: [name], MD as PCP - General (Internal Medicine) Jonelle Sidle, MD as PCP - Cardiology (Cardiology) Jena Gauss Gerrit Friends, MD as Consulting Physician (Gastroenterology) Doreatha Massed, MD as Medical Oncologist (Medical Oncology) Therese Sarah, RN as Oncology Nurse Navigator (Medical Oncology) Clinton Gallant, RN as [facility] Care Management   ASSESSMENT & PLAN:   Assessment: 1. Prostate cancer: - TRUS/Bx on 06/14/2015: 1/12 cores positive for adenocarcinoma-10% of the core in the right lateral base revealed Gleason 3+3=6.  Prostate volume 175 cc.  PSA was 16.6.  PSA density 0.09. - Seen by Dr. Marlou Porch in [facility] for robotic prostatectomy.  Due to low-volume prostate cancer and very low PSA density, it was recommended that he continue active surveillance. - 01/21/2016: Surveillance TRUS/biopsy, following a prostatic MRI which revealed no suspicious prostatic lesions and correlated the patient's large prostate.  All 12 cores were negative for prostate cancer.  Prostate volume was 190 mL. - 08/11/2018: Fusion TRUS/biopsy: Recent prostate MRI-volume 220 mL, no significant lesions.  No evidence of transcapsular/.  Ureteral, SV/bony or lymphatic disease.  Path-1 core (left apex) revealed Gleason 3+3 and 5% of core. - PSA: 17.16 (10/09/2015), 15.4 (11/04/2016), 17.9 (05/15/2017), 18.4 (04/19/2018), 15.9 (02/15/2019), 15.5 (03/20/2020), 22.4 (03/20/2021) - MRI of the lumbar spine with and without contrast on 11/27/2021 done for back pain showed expanded appearance of the sacrum with diffusely abnormal bone marrow signal and contrast-enhancement concerning for metastatic disease. - CT pelvis with contrast on 11/27/2021: Expansile, heterogeneous trabeculated appearance of the sacrum with thickening of the cortex,  unchanged from multiple prior exams.  No evidence of pelvic lymphadenopathy.  Extreme prostatomegaly. -PSMA PET scan (03/13/2022): Markedly enlarged prostate gland with focal activity inferior left aspect of the gland suggesting prostatic adenocarcinoma.  No nodal metastasis in the abdomen or pelvis.  Multifocal skeletal metastasis in the mid sacrum, right sacral ala, subtle lesions in the L2 and several rib lesions. - Degarelix started on 04/15/2022   2. Social/family history: - Lives at home with his wife.  Retired in 2003 from [employer].  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  Non-smoker. - Sister had cholangiocarcinoma.  Paternal first cousin had metastatic cancer.  Paternal uncle had prostate cancer.  3.  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  Core biopsy demonstrates hypercellular marrow (95%) with large atypical plasma cells arranged in sheets, by CD138 IHC plasma cells comprise 70% of the cellular elements, kappa restricted.  Amyloid was negative. - Chromosome analysis: 64, XY, del(20)(q11.2q13.1) [5]/46,XY[17] - Myeloma FISH panel: Tetrasomies1, 4, 11, 16 and 20, deletion of 13 q., gain of 14 q./rearrangement of IgH gene - PET CT scan (04/03/2022): No sign of tracer avid lesions of myeloma.  Chronic progressive changes involving the sacrum, left iliac bone including accentuation of trabecular markings and cortical thickening with low FDG uptake suggestive of chronic Paget's disease.  Marked prostate gland enlargement. - BMBX (03/20/2022):Hypercellular bone marrow with at least 25% plasma cells on aspirate with atypical features.  Core biopsy demonstrates hypercellular marrow for age (95%) with large atypical plasma cells arranged in sheets.  By CD138 IHC, plasma cells comprise approximately 70% of cellular elements and are kappa restricted.  Amyloid deposition was  negative by  [place] red.  Karyotype showed 20 q. deletion.  Myeloma FISH panel showed complex abnormalities. - Dara KRD started on 04/15/2022, 8 cycles completed. - Stem cell collection was completed at [facility].  Patient decided to do transplant at first relapse.    Plan: 1. 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 - PSA: 1.28 (05/28/2023), 1.33 (04/20/2023), 0.88 (03/19/2023. - Will check testosterone levels with next labs.  2.  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 - Reviewed myeloma labs from 05/28/2023: M spike 0.1 g and stable.  Free light chain ratio increased to 1.9 from 1.55.  Kappa light chains are 5.5. - We will send a message to his pharmacy for another shipment of Revlimid.  Restart Revlimid 15 mg 3 weeks on/1 week off. - He reports worsening of the low back pain which she had for several years.  He wants to follow-up with Dr. Romeo Apple.  If it continues to get worse, consider MRI of the back. - RTC 8 weeks with repeat myeloma labs.  3.  Normocytic anemia: - Hemoglobin is 11.1.  Will check ferritin and iron panel at next visit.  4.  Osteopenia (DEXA scan 06/04/2022: T score -2.1): - Calcium today is 8.8.  Continue monthly denosumab.    Orders Placed This Encounter  Procedures   Testosterone    Standing Status:   Future    Standing Expiration Date:   06/03/2024    Order Specific Question:   Release to patient    Answer:   Immediate [1]   CBC with Differential/Platelet    Standing Status:   Future    Standing Expiration Date:   06/03/2024    Order Specific Question:   Release to patient    Answer:   Immediate   Comprehensive metabolic panel    Standing Status:   Future    Standing Expiration Date:   06/03/2024    Order Specific Question:   Release to patient    Answer:   Immediate   Iron and TIBC    Standing Status:    Future    Standing Expiration Date:   06/03/2024    Order Specific Question:   Release to patient    Answer:   Immediate   Ferritin    Standing Status:   Future    Standing Expiration Date:   06/03/2024    Order Specific Question:   Release to patient    Answer:   Immediate   Protein electrophoresis, serum    Standing Status:   Future    Standing Expiration Date:   06/03/2024    Order Specific Question:   Release to patient    Answer:   Immediate   Immunofixation electrophoresis    Standing Status:   Future    Standing Expiration Date:   06/03/2024    Order Specific Question:   Release to patient    Answer:   Immediate   Kappa/lambda light chains    Standing Status:   Future    Standing Expiration Date:   06/03/2024      I,[name],acting as a scribe for Doreatha Massed, MD.,have documented all relevant documentation on the behalf of Doreatha Massed, MD,as directed by  Doreatha Massed, MD while in the presence of Doreatha Massed, MD.   I, Doreatha Massed MD, have reviewed the above documentation for accuracy  and completeness, and I agree with the above.   Doreatha Massed, MD   6/13/20244:40 PM  CHIEF COMPLAINT:   Diagnosis: prostate cancer and multiple myeloma    Cancer Staging  Multiple myeloma (HCC) Staging form: Plasma Cell Myeloma and Plasma Cell Disorders, AJCC 8th Edition - Clinical: No stage assigned - Unsigned    Prior Therapy: Carfilzomib (20/70) + Daratumumab SQ + Dexamethasone (20/40) DaraKRd q28d   Current Therapy:  Eligard; Revlimid 15 mg 3 weeks on/1 week of    HISTORY OF PRESENT ILLNESS:   Oncology History  Multiple myeloma (HCC)  04/09/2022 Initial Diagnosis   Multiple myeloma (HCC)   04/15/2022 - 11/11/2022 Chemotherapy   Patient is on Treatment Plan : MYELOMA RELAPSED/REFRACTORY Carfilzomib (20/70) + Daratumumab SQ + Dexamethasone (20/40) DaraKd q28d        INTERVAL HISTORY:   Kenaan is a 72 y.o. male  presenting to clinic today for follow up of prostate cancer and multiple myeloma. He was last seen by me on 04/27/23.  Today, he states that he is doing well overall. His appetite level is at 100%. His energy level is at 75%.  PAST MEDICAL HISTORY:   Past Medical History: Past Medical History:  Diagnosis Date   Arthritis    Asbestosis (HCC)    Closed fracture of distal end of fibula with tibia with routine healing 05/28/2020   COVID-19 09/03/2021   Cyst of kidney, acquired    Headache    History of fracture of leg    Right, childhood   Hx of migraine headaches    Hyperlipidemia    Neuropathy    PE (pulmonary thromboembolism) (HCC)    After knee surgery   Prostate cancer (HCC) 2015   Adenocarcinoma by biopsy    Surgical History: Past Surgical History:  Procedure Laterality Date   BIOPSY PROSTATE  2003 and 2005   COLONOSCOPY  11/28/2008   Dr. Rourk:internal hemorrhoids/tortus colon/ascending colon polyps, tubular adenomas   COLONOSCOPY N/A 09/04/2014   Procedure: COLONOSCOPY;  Surgeon: Corbin Ade, MD;  [facility];  Service: Endoscopy;  Laterality: N/A;  9:30   COLONOSCOPY N/A 07/17/2017   Procedure: COLONOSCOPY;  Surgeon: West Bali, MD;  [facility];  Service: Endoscopy;  Laterality: N/A;   ESOPHAGOGASTRODUODENOSCOPY N/A 07/16/2017   Procedure: ESOPHAGOGASTRODUODENOSCOPY (EGD);  Surgeon: West Bali, MD;  [facility];  Service: Endoscopy;  Laterality: N/A;   GIVENS CAPSULE STUDY  07/16/2017   Procedure: GIVENS CAPSULE STUDY;  Surgeon: West Bali, MD;  [facility];  Service: Endoscopy;;   JOINT REPLACEMENT N/A    Phreesia 01/12/2021   POLYPECTOMY  07/17/2017   Procedure: POLYPECTOMY;  Surgeon: West Bali, MD;  [facility];  Service: Endoscopy;;  cecal   PORTACATH PLACEMENT Left 04/18/2022   Procedure: INSERTION PORT-A-CATH;  Surgeon: Franky Macho, MD;  Location: [facility];  Service: General;  Laterality:  Left;   TOTAL KNEE ARTHROPLASTY Right 06/10/2017   TOTAL KNEE ARTHROPLASTY Right 06/10/2017   Procedure: TOTAL KNEE ARTHROPLASTY;  Surgeon: Loreta Ave, MD;  Location: [facility];  Service: Orthopedics;  Laterality: Right;    Social History: Social History   Socioeconomic History   Marital status: Married    Spouse name: Marvia Pickles   Number of children: Not on file   Years of education: Not on file   Highest education level: Not on file  Occupational History   Occupation: Retired  Tobacco Use  Smoking status: Never   Smokeless tobacco: Never  Vaping Use   Vaping Use: Never used  Substance and Sexual Activity   Alcohol use: No   Drug use: No   Sexual activity: Yes  Other Topics Concern   Not on file  Social History Narrative   Previously worked at [employer]   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   Social Determinants of Health   Financial Resource Strain: Low Risk  (06/01/2023)   Overall Financial Resource Strain (CARDIA)    Difficulty of Paying Living Expenses: Not hard at all  Food Insecurity: No Food Insecurity (06/01/2023)   Hunger Vital Sign    Worried About Running Out of Food in the Last Year: Never true    Ran Out of Food in the Last Year: Never true  Transportation Needs: No Transportation Needs (06/01/2023)   PRAPARE - Administrator, Civil Service (Medical): No    Lack of Transportation (Non-Medical): No  Physical Activity: Insufficiently Active (02/09/2023)   Exercise Vital Sign    Days of Exercise per Week: 5 days    Minutes of Exercise per Session: 20 min  Stress: No Stress Concern Present (06/01/2023)   [employer] of Occupational Health - Occupational Stress Questionnaire    Feeling of Stress : Not at all  Social Connections: Socially Integrated (06/01/2023)   Social Connection and Isolation Panel [NHANES]    Frequency of Communication with Friends and Family: More than three times a week    Frequency of  Social Gatherings with Friends and Family: More than three times a week    Attends Religious Services: More than 4 times per year    Active Member of [employer] or Organizations: Yes    Attends Engineer, structural: More than 4 times per year    Marital Status: Married  Catering manager Violence: Not At Risk (02/09/2023)   Humiliation, Afraid, Rape, and Kick questionnaire    Fear of Current or Ex-Partner: No    Emotionally Abused: No    Physically Abused: No    Sexually Abused: No    Family History: Family History  Problem Relation Age of Onset   Diabetes Mother    Hypertension Mother    Obesity Mother    Heart disease Mother    Mental illness Mother    Hypertension Sister    Obesity Sister    Arthritis Sister    Deep vein thrombosis Father    Dementia Father    Heart disease Father        CABG   Colon cancer Neg Hx     Current Medications:  Current Outpatient Medications:    acetaminophen (TYLENOL) 500 MG tablet, Take 1,000 mg by mouth every 6 (six) hours as needed for mild pain or moderate pain., Disp: , Rfl:    acyclovir (ZOVIRAX) 400 MG tablet, TAKE 1 TABLET BY MOUTH TWICE A DAY, Disp: 180 tablet, Rfl: 2   aspirin EC 81 MG tablet, Take 81 mg by mouth daily. Swallow whole., Disp: , Rfl:    cetirizine (ZYRTEC) 5 MG tablet, Take 5 mg by mouth as needed for allergies or rhinitis., Disp: , Rfl:    Cholecalciferol (VITAMIN D) 2000 units CAPS, Take 2,000 Units by mouth every other day., Disp: , Rfl:    daratumumab-hyaluronidase-fihj (DARZALEX FASPRO) 1800-30000 MG-UT/15ML SOLN, Inject 1,800 mg into the skin once a week., Disp: , Rfl:    hydrOXYzine (ATARAX) 10 MG tablet, TAKE  1 TABLET BY MOUTH EVERY 6 HOURS AS NEEDED FOR ITCHING, Disp: 360 tablet, Rfl: 1   lenalidomide (REVLIMID) 15 MG capsule, Take 1 capsule (15 mg total) by mouth daily. Take for 21 days, then hold for 7 days. Repeat every 28 days., Disp: 21 capsule, Rfl: 0   Multiple Vitamin (MULTIVITAMIN WITH MINERALS)  TABS tablet, Take 1 tablet by mouth daily with breakfast. Centrum Silver for Men, Disp: , Rfl:    nitroGLYCERIN (NITROSTAT) 0.4 MG SL tablet, Place 1 tablet (0.4 mg total) under the tongue every 5 (five) minutes x 3 doses as needed for chest pain (If no relief after 3rd dose, call or go to ED.)., Disp: 30 tablet, Rfl: 0   pantoprazole (PROTONIX) 40 MG tablet, TAKE 1 TABLET BY MOUTH EVERY OTHER DAY, Disp: 45 tablet, Rfl: 1   pregabalin (LYRICA) 50 MG capsule, Take 50 mg by mouth as needed (nerve pain)., Disp: , Rfl:    prochlorperazine (COMPAZINE) 10 MG tablet, Take 1 tablet (10 mg total) by mouth every 6 (six) hours as needed for nausea or vomiting., Disp: 30 tablet, Rfl: 3   tamsulosin (FLOMAX) 0.4 MG CAPS capsule, Take 0.4 mg by mouth every other day., Disp: , Rfl:    Allergies: Allergies  Allergen Reactions   Sulfa Antibiotics Other (See Comments)    Unknown reaction. Childhood reaction    REVIEW OF SYSTEMS:   Review of Systems  Constitutional:  Negative for chills, fatigue and fever.  HENT:   Negative for lump/mass, mouth sores, nosebleeds, sore throat and trouble swallowing.   Eyes:  Negative for eye problems.  Respiratory:  Negative for cough and shortness of breath.   Cardiovascular:  Negative for chest pain, leg swelling and palpitations.  Gastrointestinal:  Negative for abdominal pain, constipation, diarrhea, nausea and vomiting.  Genitourinary:  Negative for bladder incontinence, difficulty urinating, dysuria, frequency, hematuria and nocturia.   Musculoskeletal:  Positive for back pain. Negative for arthralgias, flank pain, myalgias and neck pain.  Skin:  Negative for itching and rash.  Neurological:  Positive for numbness. Negative for dizziness and headaches.  Hematological:  Does not bruise/bleed easily.  Psychiatric/Behavioral:  Negative for depression, sleep disturbance and suicidal ideas. The patient is not nervous/anxious.   All other systems reviewed and are  negative.    VITALS:   Blood pressure 117/78, pulse 60, temperature 98.3 F (36.8 C), temperature source Oral, resp. rate 18, height 6\' 2"  (1.88 m), weight 199 lb 12.8 oz (90.6 kg), SpO2 98 %.  Wt Readings from Last 3 Encounters:  06/04/23 199 lb 12.8 oz (90.6 kg)  06/03/23 200 lb (90.7 kg)  04/28/23 200 lb (90.7 kg)    Body mass index is 25.65 kg/m.  Performance status (ECOG): 1 - Symptomatic but completely ambulatory  PHYSICAL EXAM:   Physical Exam Vitals and nursing note reviewed. Exam conducted with a chaperone present.  Constitutional:      Appearance: Normal appearance.  Cardiovascular:     Rate and Rhythm: Normal rate and regular rhythm.     Pulses: Normal pulses.     Heart sounds: Normal heart sounds.  Pulmonary:     Effort: Pulmonary effort is normal.     Breath sounds: Normal breath sounds.  Abdominal:     Palpations: Abdomen is soft. There is no hepatomegaly, splenomegaly or mass.     Tenderness: There is no abdominal tenderness.  Musculoskeletal:     Right lower leg: No edema.     Left lower leg: No edema.  Lymphadenopathy:     Cervical: No cervical adenopathy.     Right cervical: No superficial, deep or posterior cervical adenopathy.    Left cervical: No superficial, deep or posterior cervical adenopathy.     Upper Body:     Right upper body: No supraclavicular or axillary adenopathy.     Left upper body: No supraclavicular or axillary adenopathy.  Neurological:     General: No focal deficit present.     Mental Status: He is alert and oriented to person, place, and time.  Psychiatric:        Mood and Affect: Mood normal.        Behavior: Behavior normal.     LABS:      Latest Ref Rng & Units 05/28/2023   11:12 AM 04/20/2023    1:45 PM 03/19/2023   10:30 AM  CBC  WBC 4.0 - 10.5 K/uL 3.4  5.0  2.8   Hemoglobin 13.0 - 17.0 g/dL 38.7  56.4  33.2   Hematocrit 39.0 - 52.0 % 33.1  33.6  31.9   Platelets 150 - 400 K/uL 188  167  194       Latest Ref  Rng & Units 05/28/2023   11:12 AM 04/20/2023    1:45 PM 03/19/2023   10:30 AM  CMP  Glucose 70 - 99 mg/dL 93  96  95   BUN 8 - 23 mg/dL 26  23  19    Creatinine 0.61 - 1.24 mg/dL 9.51  8.84  1.66   Sodium 135 - 145 mmol/L 140  139  140   Potassium 3.5 - 5.1 mmol/L 3.9  4.0  3.7   Chloride 98 - 111 mmol/L 105  107  108   CO2 22 - 32 mmol/L 27  25  25    Calcium 8.9 - 10.3 mg/dL 8.8  8.6  8.8   Total Protein 6.5 - 8.1 g/dL 5.9  5.8  5.8   Total Bilirubin 0.3 - 1.2 mg/dL 0.8  0.7  0.6   Alkaline Phos 38 - 126 U/L 36  33  35   AST 15 - 41 U/L 27  21  22    ALT 0 - 44 U/L 28  25  24       No results found for: "CEA1", "CEA" / No results found for: "CEA1", "CEA" Lab Results  Component Value Date   PSA1 22.5 (H) 11/05/2021   No results found for: "AYT016" No results found for: "CAN125"  Lab Results  Component Value Date   TOTALPROTELP 5.5 (L) 05/28/2023   TOTALPROTELP 5.4 (L) 05/28/2023   ALBUMINELP 3.4 05/28/2023   A1GS 0.2 05/28/2023   A2GS 0.6 05/28/2023   BETS 0.9 05/28/2023   GAMS 0.3 (L) 05/28/2023   MSPIKE 0.1 (H) 05/28/2023   SPEI Comment 05/28/2023   Lab Results  Component Value Date   TIBC 301 02/26/2022   TIBC 262 07/15/2017   FERRITIN 168 02/26/2022   FERRITIN 145 07/15/2017   IRONPCTSAT 42 (H) 02/26/2022   IRONPCTSAT 15 (L) 07/15/2017   Lab Results  Component Value Date   LDH 142 10/21/2022   LDH 131 08/26/2022   LDH 124 07/29/2022     STUDIES:   No results found.

## 2023-06-04 NOTE — Patient Instructions (Signed)
[facility]  Discharge Instructions  You were seen and examined today by Dr. Ellin Saba.  Dr. Ellin Saba discussed your most recent lab work which revealed that everything looks stable.  Follow-up as scheduled in 8 weeks.    Thank you for choosing [facility] to provide your oncology and hematology care.   To afford each patient quality time with our provider, please arrive at least 15 minutes before your scheduled appointment time. You may need to reschedule your appointment if you arrive late (10 or more minutes). Arriving late affects you and other patients whose appointments are after yours.  Also, if you miss three or more appointments without notifying the office, you may be dismissed from the clinic at the provider's discretion.    Again, thank you for choosing [facility].  Our hope is that these requests will decrease the amount of time that you wait before being seen by our physicians.   If you have a lab appointment with the Cancer Center - please note that after April 8th, all labs will be drawn in the cancer center.  You do not have to check in or register with the main entrance as you have in the past but will complete your check-in at the cancer center.            _____________________________________________________________  Should you have questions after your visit to [facility], please contact our office at [phone] and follow the prompts.  Our office hours are 8:00 a.m. to 4:30 p.m. Monday - Thursday and 8:00 a.m. to 2:30 p.m. Friday.  Please note that voicemails left after 4:00 p.m. may not be returned until the following business day.  We are closed weekends and all major holidays.  You do have access to a nurse 24-7, just call the main number to the clinic [phone] and do not press any options, hold on the line and a nurse will answer the phone.    For prescription refill  requests, have your pharmacy contact our office and allow 72 hours.    Masks are no longer required in the cancer centers. If you would like for your care team to wear a mask while they are taking care of you, please let them know. You may have one support person who is at least 73 years old accompany you for your appointments.

## 2023-06-05 LAB — LIPID PANEL
Chol/HDL Ratio: 3.1 ratio (ref 0.0–5.0)
Cholesterol, Total: 187 mg/dL (ref 100–199)
HDL: 61 mg/dL (ref >39)
LDL Chol Calc (NIH): 112 mg/dL — ABNORMAL HIGH (ref 0–99)
Triglycerides: 78 mg/dL (ref 0–149)
VLDL Cholesterol Cal: 14 mg/dL (ref 5–40)

## 2023-06-09 ENCOUNTER — Telehealth: Payer: Self-pay

## 2023-06-09 ENCOUNTER — Telehealth: Payer: Self-pay | Admitting: Cardiology

## 2023-06-09 DIAGNOSIS — E785 Hyperlipidemia, unspecified: Secondary | ICD-10-CM

## 2023-06-09 MED ORDER — ROSUVASTATIN CALCIUM 5 MG PO TABS: 5.0000 mg | ORAL_TABLET | Freq: Every day | ORAL | 3 refills | Status: DC

## 2023-06-09 NOTE — Telephone Encounter (Signed)
Error

## 2023-06-09 NOTE — Telephone Encounter (Signed)
Patient states that a new medication was suppose to be called in for him. Please advise

## 2023-06-09 NOTE — Telephone Encounter (Signed)
-----   Message from Jonelle Sidle, MD sent at 06/05/2023  9:41 AM EDT ----- Results reviewed.  LDL is 112 which is above goal with atherosclerosis even if asymptomatic.  HDL reassuring at 61.  Suggest starting Crestor 5 mg daily as per recent office note.  If tolerated would recheck FLP and LFTs in 3 months.

## 2023-06-09 NOTE — Telephone Encounter (Signed)
Patient notified and verbalized understanding. Patinet had no questions or concerns at this time.

## 2023-06-10 ENCOUNTER — Other Ambulatory Visit: Payer: Self-pay | Admitting: Pharmacist

## 2023-06-10 ENCOUNTER — Other Ambulatory Visit (HOSPITAL_COMMUNITY): Payer: Self-pay

## 2023-06-10 ENCOUNTER — Telehealth: Payer: Self-pay

## 2023-06-10 DIAGNOSIS — C9000 Multiple myeloma not having achieved remission: Secondary | ICD-10-CM

## 2023-06-10 MED ORDER — LENALIDOMIDE 15 MG PO CAPS
15.0000 mg | ORAL_CAPSULE | Freq: Every day | ORAL | 0 refills | Status: DC
Start: 2023-06-10 — End: 2023-07-06

## 2023-06-10 NOTE — Telephone Encounter (Signed)
Oral Oncology Patient Advocate Encounter  Was successful in securing patient a $12,000.00 grant from [facility] to provide copayment coverage for Lenalidomide.  This will keep the out of pocket expense at $0.     Healthwell ID: [id number]  I have spoken with the patient.   The billing information is as follows and has been shared with [facility].    RxBin: [id number] PCN: [id number] Member ID: [id number] Group ID: [id number] Dates of Eligibility: 06/24/23 through 06/22/24  Fund:  Multiple Myeloma - Medicare Access   **Patient has an active [employer] that expires 06/23/23 (billing information is exact same just with a Member ID of [id number]).**   Ardeen Fillers, CPhT Oncology Pharmacy Patient Advocate  [facility]  [phone] (phone) [phone] (fax) 06/10/2023 12:15 PM

## 2023-06-10 NOTE — Telephone Encounter (Signed)
Oral Oncology Patient Advocate Encounter  Re-authorization   Patient has restarted Lenalidomide  Received notification that prior authorization for Lenalidomide is required.   PA submitted on 06/10/2023  Key [id number]  Status is pending     Ardeen Fillers, CPhT Oncology Pharmacy Patient Advocate  [facility]  [phone] (phone) [phone] (fax) 06/10/2023 12:09 PM

## 2023-06-10 NOTE — Telephone Encounter (Addendum)
Oral Oncology Patient Advocate Encounter  Prior Authorization for Lenalidomide has been approved.    PA# [id number]  Effective dates: 12/22/22 through 12/22/23  Patients co-pay is $4,137.95.  Obtained [employer] to bring co-pay to $0.00.   Script and supporting documentation (med list, demographics, insurance card, and grant approval) have been sent to [facility] for processing.    Ardeen Fillers, CPhT Oncology Pharmacy Patient Advocate  [facility]  [phone] (phone) [phone] (fax) 06/10/2023 12:20 PM

## 2023-06-23 ENCOUNTER — Other Ambulatory Visit: Payer: Self-pay

## 2023-06-23 DIAGNOSIS — C9000 Multiple myeloma not having achieved remission: Secondary | ICD-10-CM

## 2023-06-26 ENCOUNTER — Inpatient Hospital Stay: Payer: Medicare HMO | Attending: Hematology

## 2023-06-26 ENCOUNTER — Inpatient Hospital Stay: Payer: Medicare HMO

## 2023-06-26 VITALS — BP 145/69 | HR 61 | Temp 97.6°F | Resp 18

## 2023-06-26 DIAGNOSIS — C9000 Multiple myeloma not having achieved remission: Secondary | ICD-10-CM

## 2023-06-26 DIAGNOSIS — C7951 Secondary malignant neoplasm of bone: Secondary | ICD-10-CM | POA: Insufficient documentation

## 2023-06-26 DIAGNOSIS — C61 Malignant neoplasm of prostate: Secondary | ICD-10-CM

## 2023-06-26 DIAGNOSIS — Z95828 Presence of other vascular implants and grafts: Secondary | ICD-10-CM

## 2023-06-26 DIAGNOSIS — M8580 Other specified disorders of bone density and structure, unspecified site: Secondary | ICD-10-CM | POA: Diagnosis not present

## 2023-06-26 LAB — COMPREHENSIVE METABOLIC PANEL
ALT: 25 U/L (ref 0–44)
AST: 21 U/L (ref 15–41)
Albumin: 3.6 g/dL (ref 3.5–5.0)
Alkaline Phosphatase: 36 U/L — ABNORMAL LOW (ref 38–126)
Anion gap: 9 (ref 5–15)
BUN: 18 mg/dL (ref 8–23)
CO2: 26 mmol/L (ref 22–32)
Calcium: 8.8 mg/dL — ABNORMAL LOW (ref 8.9–10.3)
Chloride: 104 mmol/L (ref 98–111)
Creatinine, Ser: 1.15 mg/dL (ref 0.61–1.24)
GFR, Estimated: >60 mL/min (ref >60)
Glucose, Bld: 100 mg/dL — ABNORMAL HIGH (ref 70–99)
Potassium: 3.8 mmol/L (ref 3.5–5.1)
Sodium: 139 mmol/L (ref 135–145)
Total Bilirubin: 0.4 mg/dL (ref 0.3–1.2)
Total Protein: 5.8 g/dL — ABNORMAL LOW (ref 6.5–8.1)

## 2023-06-26 MED ORDER — SODIUM CHLORIDE 0.9% FLUSH
10.0000 mL | INTRAVENOUS | Status: DC | PRN
  Administered 2023-06-26: 10 mL via INTRAVENOUS

## 2023-06-26 MED ORDER — DENOSUMAB 120 MG/1.7ML ~~LOC~~ SOLN
120.0000 mg | Freq: Once | SUBCUTANEOUS | Status: AC
  Administered 2023-06-26: 120 mg via SUBCUTANEOUS
  Filled 2023-06-26: qty 1.7

## 2023-06-26 MED ORDER — HEPARIN SOD (PORK) LOCK FLUSH 100 UNIT/ML IV SOLN
500.0000 [IU] | Freq: Once | INTRAVENOUS | Status: AC
  Administered 2023-06-26: 500 [IU] via INTRAVENOUS

## 2023-06-26 NOTE — Progress Notes (Signed)
Patient tolerated Xgeva injection with no complaints voiced. Calcium today is 8.8.  Patient is taking calcium daily and Vitamin D supplements every other day.  Site clean and dry with no bruising or swelling noted.  No complaints of pain.  Discharged with vital signs stable and no signs or symptoms of distress noted.

## 2023-06-26 NOTE — Progress Notes (Signed)
Patients port flushed without difficulty.  Good blood return noted with no bruising or swelling noted at site.  Band aid applied.  VSS with discharge and left in satisfactory condition with no s/s of distress noted.   

## 2023-06-26 NOTE — Patient Instructions (Signed)
[facility]  Discharge Instructions: Thank you for choosing [facility] to provide your oncology and hematology care.  If you have a lab appointment with the Cancer Center - please note that after April 8th, 2024, all labs will be drawn in the cancer center.  You do not have to check in or register with the main entrance as you have in the past but will complete your check-in in the cancer center.  Wear comfortable clothing and clothing appropriate for easy access to any Portacath or PICC line.   We strive to give you quality time with your provider. You may need to reschedule your appointment if you arrive late (15 or more minutes).  Arriving late affects you and other patients whose appointments are after yours.  Also, if you miss three or more appointments without notifying the office, you may be dismissed from the clinic at the provider's discretion.      For prescription refill requests, have your pharmacy contact our office and allow 72 hours for refills to be completed.    Today you received the following: Xgeva.  Denosumab Injection (Oncology) What is this medication? DENOSUMAB (den oh SUE mab) prevents weakened bones caused by cancer. It may also be used to treat noncancerous bone tumors that cannot be removed by surgery. It can also be used to treat high calcium levels in the blood caused by cancer. It works by blocking a protein that causes bones to break down quickly. This slows down the release of calcium from bones, which lowers calcium levels in your blood. It also makes your bones stronger and less likely to break (fracture). This medicine may be used for other purposes; ask your health care provider or pharmacist if you have questions. COMMON BRAND NAME(S): XGEVA What should I tell my care team before I take this medication? They need to know if you have any of these conditions: Dental disease Having surgery or tooth  extraction Infection Kidney disease Low levels of calcium or vitamin D in the blood Malnutrition On hemodialysis Skin conditions or sensitivity Thyroid or parathyroid disease An unusual reaction to denosumab, other medications, foods, dyes, or preservatives Pregnant or trying to get pregnant Breast-feeding How should I use this medication? This medication is for injection under the skin. It is given by your care team in a hospital or clinic setting. A special MedGuide will be given to you before each treatment. Be sure to read this information carefully each time. Talk to your care team about the use of this medication in children. While it may be prescribed for children as young as 13 years for selected conditions, precautions do apply. Overdosage: If you think you have taken too much of this medicine contact a poison control center or emergency room at once. NOTE: This medicine is only for you. Do not share this medicine with others. What if I miss a dose? Keep appointments for follow-up doses. It is important not to miss your dose. Call your care team if you are unable to keep an appointment. What may interact with this medication? Do not take this medication with any of the following: Other medications containing denosumab This medication may also interact with the following: Medications that lower your chance of fighting infection Steroid medications, such as prednisone or cortisone This list may not describe all possible interactions. Give your health care provider a list of all the medicines, herbs, non-prescription drugs, or dietary supplements you use. Also tell them if  you smoke, drink alcohol, or use illegal drugs. Some items may interact with your medicine. What should I watch for while using this medication? Your condition will be monitored carefully while you are receiving this medication. You may need blood work while taking this medication. This medication may increase  your risk of getting an infection. Call your care team for advice if you get a fever, chills, sore throat, or other symptoms of a cold or flu. Do not treat yourself. Try to avoid being around people who are sick. You should make sure you get enough calcium and vitamin D while you are taking this medication, unless your care team tells you not to. Discuss the foods you eat and the vitamins you take with your care team. Some people who take this medication have severe bone, joint, or muscle pain. This medication may also increase your risk for jaw problems or a broken thigh bone. Tell your care team right away if you have severe pain in your jaw, bones, joints, or muscles. Tell your care team if you have any pain that does not go away or that gets worse. Talk to your care team if you may be pregnant. Serious birth defects can occur if you take this medication during pregnancy and for 5 months after the last dose. You will need a negative pregnancy test before starting this medication. Contraception is recommended while taking this medication and for 5 months after the last dose. Your care team can help you find the option that works for you. What side effects may I notice from receiving this medication? Side effects that you should report to your care team as soon as possible: Allergic reactions--skin rash, itching, hives, swelling of the face, lips, tongue, or throat Bone, joint, or muscle pain Low calcium level--muscle pain or cramps, confusion, tingling, or numbness in the hands or feet Osteonecrosis of the jaw--pain, swelling, or redness in the mouth, numbness of the jaw, poor healing after dental work, unusual discharge from the mouth, visible bones in the mouth Side effects that usually do not require medical attention (report to your care team if they continue or are bothersome): Cough Diarrhea Fatigue Headache Nausea This list may not describe all possible side effects. Call your doctor for  medical advice about side effects. You may report side effects to FDA at 1-800-FDA-1088. Where should I keep my medication? This medication is given in a hospital or clinic. It will not be stored at home. NOTE: This sheet is a summary. It may not cover all possible information. If you have questions about this medicine, talk to your doctor, pharmacist, or health care provider.  2024 Elsevier/Gold Standard (2022-04-30 00:00:00)     To help prevent nausea and vomiting after your treatment, we encourage you to take your nausea medication as directed.  BELOW ARE SYMPTOMS THAT SHOULD BE REPORTED IMMEDIATELY: *FEVER GREATER THAN 100.4 F (38 C) OR HIGHER *CHILLS OR SWEATING *NAUSEA AND VOMITING THAT IS NOT CONTROLLED WITH YOUR NAUSEA MEDICATION *UNUSUAL SHORTNESS OF BREATH *UNUSUAL BRUISING OR BLEEDING *URINARY PROBLEMS (pain or burning when urinating, or frequent urination) *BOWEL PROBLEMS (unusual diarrhea, constipation, pain near the anus) TENDERNESS IN MOUTH AND THROAT WITH OR WITHOUT PRESENCE OF ULCERS (sore throat, sores in mouth, or a toothache) UNUSUAL RASH, SWELLING OR PAIN  UNUSUAL VAGINAL DISCHARGE OR ITCHING   Items with * indicate a potential emergency and should be followed up as soon as possible or go to the Emergency Department if any problems should occur.  Please show the CHEMOTHERAPY ALERT CARD or IMMUNOTHERAPY ALERT CARD at check-in to the Emergency Department and triage nurse.  Should you have questions after your visit or need to cancel or reschedule your appointment, please contact [facility] [phone]  and follow the prompts.  Office hours are 8:00 a.m. to 4:30 p.m. Monday - Friday. Please note that voicemails left after 4:00 p.m. may not be returned until the following business day.  We are closed weekends and major holidays. You have access to a nurse at all times for urgent questions. Please call the main number to the clinic [phone] and  follow the prompts.  For any non-urgent questions, you may also contact your provider using MyChart. We now offer e-Visits for anyone 74 and older to request care online for non-urgent symptoms. For details visit [email or web address].   Also download the MyChart app! Go to the app store, search "MyChart", open the app, select Start, and log in with your MyChart username and password.

## 2023-06-27 LAB — TESTOSTERONE: Testosterone: <3 ng/dL — ABNORMAL LOW (ref 264–916)

## 2023-07-03 ENCOUNTER — Other Ambulatory Visit: Payer: Self-pay | Admitting: Hematology

## 2023-07-06 ENCOUNTER — Other Ambulatory Visit: Payer: Self-pay

## 2023-07-06 DIAGNOSIS — C9000 Multiple myeloma not having achieved remission: Secondary | ICD-10-CM

## 2023-07-06 MED ORDER — LENALIDOMIDE 15 MG PO CAPS
15.0000 mg | ORAL_CAPSULE | Freq: Every day | ORAL | 0 refills | Status: DC
Start: 2023-07-06 — End: 2023-07-31

## 2023-07-06 NOTE — Telephone Encounter (Signed)
Chart reviewed. Revlimid refilled per last office note with Dr. Katragadda.  

## 2023-07-16 ENCOUNTER — Inpatient Hospital Stay: Payer: Medicare HMO

## 2023-07-16 DIAGNOSIS — C7951 Secondary malignant neoplasm of bone: Secondary | ICD-10-CM | POA: Diagnosis not present

## 2023-07-16 DIAGNOSIS — M8580 Other specified disorders of bone density and structure, unspecified site: Secondary | ICD-10-CM | POA: Diagnosis not present

## 2023-07-16 DIAGNOSIS — C9000 Multiple myeloma not having achieved remission: Secondary | ICD-10-CM | POA: Diagnosis not present

## 2023-07-16 DIAGNOSIS — C61 Malignant neoplasm of prostate: Secondary | ICD-10-CM | POA: Diagnosis not present

## 2023-07-16 LAB — CBC WITH DIFFERENTIAL/PLATELET
Abs Immature Granulocytes: 0 10*3/uL (ref 0.00–0.07)
Basophils Absolute: 0.1 10*3/uL (ref 0.0–0.1)
Basophils Relative: 2 %
Eosinophils Absolute: 0.1 10*3/uL (ref 0.0–0.5)
Eosinophils Relative: 3 %
HCT: 32.9 % — ABNORMAL LOW (ref 39.0–52.0)
Hemoglobin: 11 g/dL — ABNORMAL LOW (ref 13.0–17.0)
Immature Granulocytes: 0 %
Lymphocytes Relative: 17 %
Lymphs Abs: 0.5 10*3/uL — ABNORMAL LOW (ref 0.7–4.0)
MCH: 29.4 pg (ref 26.0–34.0)
MCHC: 33.4 g/dL (ref 30.0–36.0)
MCV: 88 fL (ref 80.0–100.0)
Monocytes Absolute: 0.3 10*3/uL (ref 0.1–1.0)
Monocytes Relative: 11 %
Neutro Abs: 2.1 10*3/uL (ref 1.7–7.7)
Neutrophils Relative %: 67 %
Platelets: 198 10*3/uL (ref 150–400)
RBC: 3.74 MIL/uL — ABNORMAL LOW (ref 4.22–5.81)
RDW: 15.9 % — ABNORMAL HIGH (ref 11.5–15.5)
WBC: 3.1 10*3/uL — ABNORMAL LOW (ref 4.0–10.5)
nRBC: 0 % (ref 0.0–0.2)

## 2023-07-16 LAB — COMPREHENSIVE METABOLIC PANEL
ALT: 25 U/L (ref 0–44)
AST: 23 U/L (ref 15–41)
Albumin: 3.4 g/dL — ABNORMAL LOW (ref 3.5–5.0)
Alkaline Phosphatase: 35 U/L — ABNORMAL LOW (ref 38–126)
Anion gap: 7 (ref 5–15)
BUN: 26 mg/dL — ABNORMAL HIGH (ref 8–23)
CO2: 23 mmol/L (ref 22–32)
Calcium: 8.3 mg/dL — ABNORMAL LOW (ref 8.9–10.3)
Chloride: 108 mmol/L (ref 98–111)
Creatinine, Ser: 1.34 mg/dL — ABNORMAL HIGH (ref 0.61–1.24)
GFR, Estimated: 56 mL/min — ABNORMAL LOW (ref >60)
Glucose, Bld: 109 mg/dL — ABNORMAL HIGH (ref 70–99)
Potassium: 3.6 mmol/L (ref 3.5–5.1)
Sodium: 138 mmol/L (ref 135–145)
Total Bilirubin: 0.9 mg/dL (ref 0.3–1.2)
Total Protein: 5.8 g/dL — ABNORMAL LOW (ref 6.5–8.1)

## 2023-07-16 LAB — IRON AND TIBC
Iron: 47 ug/dL (ref 45–182)
Saturation Ratios: 11 % — ABNORMAL LOW (ref 17.9–39.5)
TIBC: 441 ug/dL (ref 250–450)
UIBC: 394 ug/dL

## 2023-07-16 LAB — FERRITIN: Ferritin: 11 ng/mL — ABNORMAL LOW (ref 24–336)

## 2023-07-16 MED ORDER — HEPARIN SOD (PORK) LOCK FLUSH 100 UNIT/ML IV SOLN
500.0000 [IU] | Freq: Once | INTRAVENOUS | Status: AC
  Administered 2023-07-16: 500 [IU] via INTRAVENOUS

## 2023-07-16 MED ORDER — SODIUM CHLORIDE 0.9% FLUSH
10.0000 mL | INTRAVENOUS | Status: DC | PRN
  Administered 2023-07-16: 10 mL via INTRAVENOUS

## 2023-07-16 NOTE — Patient Instructions (Signed)
[facility]  Discharge Instructions: Thank you for choosing [facility] to provide your oncology and hematology care.  If you have a lab appointment with the Cancer Center - please note that after April 8th, 2024, all labs will be drawn in the cancer center.  You do not have to check in or register with the main entrance as you have in the past but will complete your check-in in the cancer center.  Wear comfortable clothing and clothing appropriate for easy access to any Portacath or PICC line.   We strive to give you quality time with your provider. You may need to reschedule your appointment if you arrive late (15 or more minutes).  Arriving late affects you and other patients whose appointments are after yours.  Also, if you miss three or more appointments without notifying the office, you may be dismissed from the clinic at the provider's discretion.      For prescription refill requests, have your pharmacy contact our office and allow 72 hours for refills to be completed.    Today you had your port flushed, had to draw labs peripherally   To help prevent nausea and vomiting after your treatment, we encourage you to take your nausea medication as directed.  BELOW ARE SYMPTOMS THAT SHOULD BE REPORTED IMMEDIATELY: *FEVER GREATER THAN 100.4 F (38 C) OR HIGHER *CHILLS OR SWEATING *NAUSEA AND VOMITING THAT IS NOT CONTROLLED WITH YOUR NAUSEA MEDICATION *UNUSUAL SHORTNESS OF BREATH *UNUSUAL BRUISING OR BLEEDING *URINARY PROBLEMS (pain or burning when urinating, or frequent urination) *BOWEL PROBLEMS (unusual diarrhea, constipation, pain near the anus) TENDERNESS IN MOUTH AND THROAT WITH OR WITHOUT PRESENCE OF ULCERS (sore throat, sores in mouth, or a toothache) UNUSUAL RASH, SWELLING OR PAIN  UNUSUAL VAGINAL DISCHARGE OR ITCHING   Items with * indicate a potential emergency and should be followed up as soon as possible or go to the Emergency Department if  any problems should occur.  Please show the CHEMOTHERAPY ALERT CARD or IMMUNOTHERAPY ALERT CARD at check-in to the Emergency Department and triage nurse.  Should you have questions after your visit or need to cancel or reschedule your appointment, please contact [facility] AT [facility] [phone]  and follow the prompts.  Office hours are 8:00 a.m. to 4:30 p.m. Monday - Friday. Please note that voicemails left after 4:00 p.m. may not be returned until the following business day.  We are closed weekends and major holidays. You have access to a nurse at all times for urgent questions. Please call the main number to the clinic [phone] and follow the prompts.  For any non-urgent questions, you may also contact your provider using MyChart. We now offer e-Visits for anyone 94 and older to request care online for non-urgent symptoms. For details visit [email or web address].   Also download the MyChart app! Go to the app store, search "MyChart", open the app, select [facility], and log in with your MyChart username and password.

## 2023-07-16 NOTE — Progress Notes (Signed)
Luis Stewart presented for Portacath access and flush. Portacath located right  chest wall accessed with  H 20 needle. No blood return and no resistance met Portacath flushed with 20ml NS and 500U/24ml Heparin and needle removed intact. Procedure without incident. Patient tolerated procedure well.

## 2023-07-22 ENCOUNTER — Other Ambulatory Visit: Payer: Self-pay | Admitting: Internal Medicine

## 2023-07-22 NOTE — Progress Notes (Signed)
[facility] [address] [place] [address]    Clinic Day:  07/23/23  Referring physician: [name], MD  Patient Care Team: [name], MD as PCP - General (Internal Medicine) Jonelle Sidle, MD as PCP - Cardiology (Cardiology) Jena Gauss Gerrit Friends, MD as Consulting Physician (Gastroenterology) Doreatha Massed, MD as Medical Oncologist (Medical Oncology) Therese Sarah, RN as Oncology Nurse Navigator (Medical Oncology) Clinton Gallant, RN as [facility] Care Management   ASSESSMENT & PLAN:   Assessment: 1. Prostate cancer: - TRUS/Bx on 06/14/2015: 1/12 cores positive for adenocarcinoma-10% of the core in the right lateral base revealed Gleason 3+3=6.  Prostate volume 175 cc.  PSA was 16.6.  PSA density 0.09. - Seen by Dr. Marlou Porch in [facility] for robotic prostatectomy.  Due to low-volume prostate cancer and very low PSA density, it was recommended that he continue active surveillance. - 01/21/2016: Surveillance TRUS/biopsy, following a prostatic MRI which revealed no suspicious prostatic lesions and correlated the patient's large prostate.  All 12 cores were negative for prostate cancer.  Prostate volume was 190 mL. - 08/11/2018: Fusion TRUS/biopsy: Recent prostate MRI-volume 220 mL, no significant lesions.  No evidence of transcapsular/.  Ureteral, SV/bony or lymphatic disease.  Path-1 core (left apex) revealed Gleason 3+3 and 5% of core. - PSA: 17.16 (10/09/2015), 15.4 (11/04/2016), 17.9 (05/15/2017), 18.4 (04/19/2018), 15.9 (02/15/2019), 15.5 (03/20/2020), 22.4 (03/20/2021) - MRI of the lumbar spine with and without contrast on 11/27/2021 done for back pain showed expanded appearance of the sacrum with diffusely abnormal bone marrow signal and contrast-enhancement concerning for metastatic disease. - CT pelvis with contrast on 11/27/2021: Expansile, heterogeneous trabeculated appearance of the sacrum with thickening of the cortex, unchanged  from multiple prior exams.  No evidence of pelvic lymphadenopathy.  Extreme prostatomegaly. -PSMA PET scan (03/13/2022): Markedly enlarged prostate gland with focal activity inferior left aspect of the gland suggesting prostatic adenocarcinoma.  No nodal metastasis in the abdomen or pelvis.  Multifocal skeletal metastasis in the mid sacrum, right sacral ala, subtle lesions in the L2 and several rib lesions. - Degarelix started on 04/15/2022   2. Social/family history: - Lives at home with his wife.  Retired in 2003 from [employer].  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  Non-smoker. - Sister had cholangiocarcinoma.  Paternal first cousin had metastatic cancer.  Paternal uncle had prostate cancer.  3.  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  Core biopsy demonstrates hypercellular marrow (95%) with large atypical plasma cells arranged in sheets, by CD138 IHC plasma cells comprise 70% of the cellular elements, kappa restricted.  Amyloid was negative. - Chromosome analysis: 34, XY, del(20)(q11.2q13.1) [5]/46,XY[17] - Myeloma FISH panel: Tetrasomies1, 4, 11, 16 and 20, deletion of 13 q., gain of 14 q./rearrangement of IgH gene - PET CT scan (04/03/2022): No sign of tracer avid lesions of myeloma.  Chronic progressive changes involving the sacrum, left iliac bone including accentuation of trabecular markings and cortical thickening with low FDG uptake suggestive of chronic Paget's disease.  Marked prostate gland enlargement. - BMBX (03/20/2022):Hypercellular bone marrow with at least 25% plasma cells on aspirate with atypical features.  Core biopsy demonstrates hypercellular marrow for age (95%) with large atypical plasma cells arranged in sheets.  By CD138 IHC, plasma cells comprise approximately 70% of cellular elements and are kappa restricted.  Amyloid deposition was negative  by  [place] red.  Karyotype showed 20 q. deletion.  Myeloma FISH panel showed complex abnormalities. - Dara KRD started on 04/15/2022, 8 cycles completed. - Stem cell collection was completed at [facility].  Patient decided to do transplant at first relapse.    Plan: 1. 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 - PSA: 1.28 (05/28/2023), 1.33 (04/20/2023), 0.88 (03/19/2023. - Will check testosterone levels with next labs.  2.  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 - Reviewed myeloma labs from 05/28/2023: M spike 0.1 g and stable.  Free light chain ratio increased to 1.9 from 1.55.  Kappa light chains are 5.5. - We will send a message to his pharmacy for another shipment of Revlimid.  Restart Revlimid 15 mg 3 weeks on/1 week off. - He reports worsening of the low back pain which she had for several years.  He wants to follow-up with Dr. Romeo Apple.  If it continues to get worse, consider MRI of the back. - RTC 8 weeks with repeat myeloma labs.  3.  Normocytic anemia: - Hemoglobin is 11.1.  Will check ferritin and iron panel at next visit.  4.  Osteopenia (DEXA scan 06/04/2022: T score -2.1): - Calcium today is 8.8.  Continue monthly denosumab.    No orders of the defined types were placed in this encounter.     Luis Stewart,acting as a Neurosurgeon for Doreatha Massed, MD.,have documented all relevant documentation on the behalf of Doreatha Massed, MD,as directed by  Doreatha Massed, MD while in the presence of Doreatha Massed, MD.  ***   [place]   7/31/20249:43 PM  CHIEF COMPLAINT:   Diagnosis: prostate cancer and multiple myeloma    Cancer Staging  Multiple myeloma (HCC) Staging form: Plasma Cell Myeloma and Plasma Cell Disorders, AJCC 8th Edition - Clinical: No stage assigned - Unsigned    Prior  Therapy: Carfilzomib (20/70) + Daratumumab SQ + Dexamethasone (20/40) DaraKRd q28d   Current Therapy:  Eligard; Revlimid 15 mg 3 weeks on/1 week of    HISTORY OF PRESENT ILLNESS:   Oncology History  Multiple myeloma (HCC)  04/09/2022 Initial Diagnosis   Multiple myeloma (HCC)   04/15/2022 - 11/11/2022 Chemotherapy   Patient is on Treatment Plan : MYELOMA RELAPSED/REFRACTORY Carfilzomib (20/70) + Daratumumab SQ + Dexamethasone (20/40) DaraKd q28d        INTERVAL HISTORY:   Luis Stewart is a 72 y.o. male presenting to clinic today for follow up of prostate cancer and multiple myeloma. He was last seen by me on 06/04/23.  Today, he states that he is doing well overall. His appetite level is at ***%. His energy level is at ***%.  PAST MEDICAL HISTORY:   Past Medical History: Past Medical History:  Diagnosis Date   Arthritis    Asbestosis (HCC)    Closed fracture of distal end of fibula with tibia with routine healing 05/28/2020   COVID-19 09/03/2021   Cyst of kidney, acquired    Headache    History of fracture of leg    Right, childhood   Hx of migraine headaches    Hyperlipidemia    Neuropathy    PE (pulmonary thromboembolism) (HCC)    After knee surgery   Prostate cancer (HCC) 2015   Adenocarcinoma by biopsy    Surgical History: Past Surgical History:  Procedure Laterality Date  BIOPSY PROSTATE  2003 and 2005   COLONOSCOPY  11/28/2008   Dr. Rourk:internal hemorrhoids/tortus colon/ascending colon polyps, tubular adenomas   COLONOSCOPY N/A 09/04/2014   Procedure: COLONOSCOPY;  Surgeon: Corbin Ade, MD;  Location: [facility];  Service: Endoscopy;  Laterality: N/A;  9:30   COLONOSCOPY N/A 07/17/2017   Procedure: COLONOSCOPY;  Surgeon: West Bali, MD;  Location: [facility];  Service: Endoscopy;  Laterality: N/A;   ESOPHAGOGASTRODUODENOSCOPY N/A 07/16/2017   Procedure: ESOPHAGOGASTRODUODENOSCOPY (EGD);  Surgeon: West Bali, MD;  [facility]: [facility];  Service:  Endoscopy;  Laterality: N/A;   GIVENS CAPSULE STUDY  07/16/2017   Procedure: GIVENS CAPSULE STUDY;  Surgeon: West Bali, MD;  Location: [facility];  Service: Endoscopy;;   JOINT REPLACEMENT N/A    Phreesia 01/12/2021   POLYPECTOMY  07/17/2017   Procedure: POLYPECTOMY;  Surgeon: West Bali, MD;  [facility]: [facility];  Service: Endoscopy;;  cecal   PORTACATH PLACEMENT Left 04/18/2022   Procedure: INSERTION PORT-A-CATH;  Surgeon: Franky Macho, MD;  Location: [facility];  Service: General;  Laterality: Left;   TOTAL KNEE ARTHROPLASTY Right 06/10/2017   TOTAL KNEE ARTHROPLASTY Right 06/10/2017   Procedure: TOTAL KNEE ARTHROPLASTY;  Surgeon: Loreta Ave, MD;  Location: [facility] OR;  Service: Orthopedics;  Laterality: Right;    Social History: Social History   Socioeconomic History   Marital status: Married    Spouse name: Marvia Pickles   Number of children: Not on file   Years of education: Not on file   Highest education level: Not on file  Occupational History   Occupation: Retired  Tobacco Use   Smoking status: Never   Smokeless tobacco: Never  Vaping Use   Vaping status: Never Used  Substance and Sexual Activity   Alcohol use: No   Drug use: No   Sexual activity: Yes  Other Topics Concern   Not on file  Social History Narrative   Previously worked at [employer]   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   Social Determinants of Health   Financial Resource Strain: Low Risk  (06/01/2023)   Overall Financial Resource Strain (CARDIA)    Difficulty of Paying Living Expenses: Not hard at all  Food Insecurity: No Food Insecurity (06/01/2023)   Hunger Vital Sign    Worried About Running Out of Food in the Last Year: Never true    Ran Out of Food in the Last Year: Never true  Transportation Needs: No Transportation Needs (06/01/2023)   PRAPARE - Administrator, Civil Service (Medical): No    Lack of Transportation (Non-Medical): No   Physical Activity: Insufficiently Active (02/09/2023)   Exercise Vital Sign    Days of Exercise per Week: 5 days    Minutes of Exercise per Session: 20 min  Stress: No Stress Concern Present (06/01/2023)   [employer] of Occupational Health - Occupational Stress Questionnaire    Feeling of Stress : Not at all  Social Connections: Socially Integrated (06/01/2023)   Social Connection and Isolation Panel [NHANES]    Frequency of Communication with Friends and Family: More than three times a week    Frequency of Social Gatherings with Friends and Family: More than three times a week    Attends Religious Services: More than 4 times per year    Active Member of [employer] or Organizations: Yes    Attends Banker Meetings: More than 4 times  per year    Marital Status: Married  Catering manager Violence: Not At Risk (02/09/2023)   Humiliation, Afraid, Rape, and Kick questionnaire    Fear of Current or Ex-Partner: No    Emotionally Abused: No    Physically Abused: No    Sexually Abused: No    Family History: Family History  Problem Relation Age of Onset   Diabetes Mother    Hypertension Mother    Obesity Mother    Heart disease Mother    Mental illness Mother    Hypertension Sister    Obesity Sister    Arthritis Sister    Deep vein thrombosis Father    Dementia Father    Heart disease Father        CABG   Colon cancer Neg Hx     Current Medications:  Current Outpatient Medications:    acetaminophen (TYLENOL) 500 MG tablet, Take 1,000 mg by mouth every 6 (six) hours as needed for mild pain or moderate pain., Disp: , Rfl:    acyclovir (ZOVIRAX) 400 MG tablet, TAKE 1 TABLET BY MOUTH TWICE A DAY, Disp: 180 tablet, Rfl: 2   aspirin EC 81 MG tablet, Take 81 mg by mouth daily. Swallow whole., Disp: , Rfl:    cetirizine (ZYRTEC) 5 MG tablet, Take 5 mg by mouth as needed for allergies or rhinitis., Disp: , Rfl:    Cholecalciferol (VITAMIN D) 2000 units CAPS, Take 2,000 Units  by mouth every other day., Disp: , Rfl:    daratumumab-hyaluronidase-fihj (DARZALEX FASPRO) 1800-30000 MG-UT/15ML SOLN, Inject 1,800 mg into the skin once a week., Disp: , Rfl:    hydrOXYzine (ATARAX) 10 MG tablet, TAKE 1 TABLET BY MOUTH EVERY 6 HOURS AS NEEDED FOR ITCHING, Disp: 360 tablet, Rfl: 1   lenalidomide (REVLIMID) 15 MG capsule, Take 1 capsule (15 mg total) by mouth daily. Take for 21 days, then hold for 7 days. Repeat every 28 days., Disp: 21 capsule, Rfl: 0   Multiple Vitamin (MULTIVITAMIN WITH MINERALS) TABS tablet, Take 1 tablet by mouth daily with breakfast. Centrum Silver for Men, Disp: , Rfl:    nitroGLYCERIN (NITROSTAT) 0.4 MG SL tablet, Place 1 tablet (0.4 mg total) under the tongue every 5 (five) minutes x 3 doses as needed for chest pain (If no relief after 3rd dose, call or go to ED.)., Disp: 30 tablet, Rfl: 0   pantoprazole (PROTONIX) 40 MG tablet, TAKE 1 TABLET BY MOUTH EVERY OTHER DAY, Disp: 45 tablet, Rfl: 1   pregabalin (LYRICA) 50 MG capsule, Take 50 mg by mouth as needed (nerve pain)., Disp: , Rfl:    prochlorperazine (COMPAZINE) 10 MG tablet, Take 1 tablet (10 mg total) by mouth every 6 (six) hours as needed for nausea or vomiting., Disp: 30 tablet, Rfl: 3   rosuvastatin (CRESTOR) 5 MG tablet, Take 1 tablet (5 mg total) by mouth daily., Disp: 90 tablet, Rfl: 3   tamsulosin (FLOMAX) 0.4 MG CAPS capsule, Take 0.4 mg by mouth every other day., Disp: , Rfl:    Allergies: Allergies  Allergen Reactions   Sulfa Antibiotics Other (See Comments)    Unknown reaction. Childhood reaction    REVIEW OF SYSTEMS:   Review of Systems  Constitutional:  Negative for chills, fatigue and fever.  HENT:   Negative for lump/mass, mouth sores, nosebleeds, sore throat and trouble swallowing.   Eyes:  Negative for eye problems.  Respiratory:  Negative for cough and shortness of breath.   Cardiovascular:  Negative for chest pain,  leg swelling and palpitations.  Gastrointestinal:   Negative for abdominal pain, constipation, diarrhea, nausea and vomiting.  Genitourinary:  Negative for bladder incontinence, difficulty urinating, dysuria, frequency, hematuria and nocturia.   Musculoskeletal:  Negative for arthralgias, back pain, flank pain, myalgias and neck pain.  Skin:  Negative for itching and rash.  Neurological:  Negative for dizziness, headaches and numbness.  Hematological:  Does not bruise/bleed easily.  Psychiatric/Behavioral:  Negative for depression, sleep disturbance and suicidal ideas. The patient is not nervous/anxious.   All other systems reviewed and are negative.    VITALS:   There were no vitals taken for this visit.  Wt Readings from Last 3 Encounters:  06/04/23 199 lb 12.8 oz (90.6 kg)  06/03/23 200 lb (90.7 kg)  04/28/23 200 lb (90.7 kg)    There is no height or weight on file to calculate BMI.  Performance status (ECOG): 1 - Symptomatic but completely ambulatory  PHYSICAL EXAM:   Physical Exam Vitals and nursing note reviewed. Exam conducted with a chaperone present.  Constitutional:      Appearance: Normal appearance.  Cardiovascular:     Rate and Rhythm: Normal rate and regular rhythm.     Pulses: Normal pulses.     Heart sounds: Normal heart sounds.  Pulmonary:     Effort: Pulmonary effort is normal.     Breath sounds: Normal breath sounds.  Abdominal:     Palpations: Abdomen is soft. There is no hepatomegaly, splenomegaly or mass.     Tenderness: There is no abdominal tenderness.  Musculoskeletal:     Right lower leg: No edema.     Left lower leg: No edema.  Lymphadenopathy:     Cervical: No cervical adenopathy.     Right cervical: No superficial, deep or posterior cervical adenopathy.    Left cervical: No superficial, deep or posterior cervical adenopathy.     Upper Body:     Right upper body: No supraclavicular or axillary adenopathy.     Left upper body: No supraclavicular or axillary adenopathy.  Neurological:      General: No focal deficit present.     Mental Status: He is alert and oriented to person, place, and time.  Psychiatric:        Mood and Affect: Mood normal.        Behavior: Behavior normal.     LABS:      Latest Ref Rng & Units 07/16/2023    9:57 AM 05/28/2023   11:12 AM 04/20/2023    1:45 PM  CBC  WBC 4.0 - 10.5 K/uL 3.1  3.4  5.0   Hemoglobin 13.0 - 17.0 g/dL 09.8  11.9  14.7   Hematocrit 39.0 - 52.0 % 32.9  33.1  33.6   Platelets 150 - 400 K/uL 198  188  167       Latest Ref Rng & Units 07/16/2023    9:57 AM 06/26/2023    8:26 AM 05/28/2023   11:12 AM  CMP  Glucose 70 - 99 mg/dL 829  562  93   BUN 8 - 23 mg/dL 26  18  26    Creatinine 0.61 - 1.24 mg/dL 1.30  8.65  7.84   Sodium 135 - 145 mmol/L 138  139  140   Potassium 3.5 - 5.1 mmol/L 3.6  3.8  3.9   Chloride 98 - 111 mmol/L 108  104  105   CO2 22 - 32 mmol/L 23  26  27    Calcium 8.9 -  10.3 mg/dL 8.3  8.8  8.8   Total Protein 6.5 - 8.1 g/dL 5.8  5.8  5.9   Total Bilirubin 0.3 - 1.2 mg/dL 0.9  0.4  0.8   Alkaline Phos 38 - 126 U/L 35  36  36   AST 15 - 41 U/L 23  21  27    ALT 0 - 44 U/L 25  25  28       No results found for: "CEA1", "CEA" / No results found for: "CEA1", "CEA" Lab Results  Component Value Date   PSA1 22.5 (H) 11/05/2021   No results found for: "ZOX096" No results found for: "CAN125"  Lab Results  Component Value Date   TOTALPROTELP 5.3 (L) 07/16/2023   TOTALPROTELP 5.4 (L) 07/16/2023   ALBUMINELP 3.3 07/16/2023   A1GS 0.2 07/16/2023   A2GS 0.6 07/16/2023   BETS 0.9 07/16/2023   GAMS 0.3 (L) 07/16/2023   MSPIKE 0.1 (H) 07/16/2023   SPEI Comment 07/16/2023   Lab Results  Component Value Date   TIBC 441 07/16/2023   TIBC 301 02/26/2022   TIBC 262 07/15/2017   FERRITIN 11 (L) 07/16/2023   FERRITIN 168 02/26/2022   FERRITIN 145 07/15/2017   IRONPCTSAT 11 (L) 07/16/2023   IRONPCTSAT 42 (H) 02/26/2022   IRONPCTSAT 15 (L) 07/15/2017   Lab Results  Component Value Date   LDH 142 10/21/2022    LDH 131 08/26/2022   LDH 124 07/29/2022     STUDIES:   No results found.

## 2023-07-23 ENCOUNTER — Inpatient Hospital Stay: Payer: Medicare HMO

## 2023-07-23 ENCOUNTER — Inpatient Hospital Stay: Payer: Medicare HMO | Attending: Hematology | Admitting: Hematology

## 2023-07-23 VITALS — BP 133/76 | HR 60 | Temp 97.1°F | Resp 18 | Ht 74.0 in | Wt 202.4 lb

## 2023-07-23 DIAGNOSIS — D508 Other iron deficiency anemias: Secondary | ICD-10-CM | POA: Diagnosis not present

## 2023-07-23 DIAGNOSIS — C9000 Multiple myeloma not having achieved remission: Secondary | ICD-10-CM | POA: Insufficient documentation

## 2023-07-23 DIAGNOSIS — C61 Malignant neoplasm of prostate: Secondary | ICD-10-CM

## 2023-07-23 MED ORDER — DENOSUMAB 120 MG/1.7ML ~~LOC~~ SOLN
120.0000 mg | Freq: Once | SUBCUTANEOUS | Status: AC
  Administered 2023-07-23: 120 mg via SUBCUTANEOUS
  Filled 2023-07-23: qty 1.7

## 2023-07-23 NOTE — Patient Instructions (Signed)
[facility] at [facility] Discharge Instructions   You were seen and examined today by Dr. Ellin Saba.  He reviewed the results of your lab work which are normal/stable. You iron was low. Dr. Kirtland Bouchard recommends you take an iron pill 325 mg daily.   We will see you back in 10 weeks. We will repeat lab work prior to your next visit.   Return as scheduled.    Thank you for choosing [facility] at [facility] to provide your oncology and hematology care.  To afford each patient quality time with our provider, please arrive at least 15 minutes before your scheduled appointment time.   If you have a lab appointment with the Cancer Center please come in thru the Main Entrance and check in at the main information desk.  You need to re-schedule your appointment should you arrive 10 or more minutes late.  We strive to give you quality time with our providers, and arriving late affects you and other patients whose appointments are after yours.  Also, if you no show three or more times for appointments you may be dismissed from the clinic at the providers discretion.     Again, thank you for choosing [facility].  Our hope is that these requests will decrease the amount of time that you wait before being seen by our physicians.       _____________________________________________________________  Should you have questions after your visit to [facility], please contact our office at [phone] and follow the prompts.  Our office hours are 8:00 a.m. and 4:30 p.m. Monday - Friday.  Please note that voicemails left after 4:00 p.m. may not be returned until the following business day.  We are closed weekends and major holidays.  You do have access to a nurse 24-7, just call the main number to the clinic [phone] and do not press any options, hold on the line and a nurse will answer the phone.    For prescription refill requests, have your  pharmacy contact our office and allow 72 hours.    Due to Covid, you will need to wear a mask upon entering the hospital. If you do not have a mask, a mask will be given to you at the Main Entrance upon arrival. For doctor visits, patients may have 1 support person age 12 or older with them. For treatment visits, patients can not have anyone with them due to social distancing guidelines and our immunocompromised population.

## 2023-07-23 NOTE — Progress Notes (Signed)
Patient tolerated injection with no complaints voiced.  Site clean and dry with no bruising or swelling noted at site.  See MAR for details.  Band aid applied.  Patient stable during and after injection.  Vss with discharge and left in satisfactory condition with no s/s of distress noted.  

## 2023-07-23 NOTE — Patient Instructions (Signed)

## 2023-07-31 ENCOUNTER — Other Ambulatory Visit: Payer: Self-pay

## 2023-07-31 DIAGNOSIS — C9000 Multiple myeloma not having achieved remission: Secondary | ICD-10-CM

## 2023-07-31 MED ORDER — LENALIDOMIDE 15 MG PO CAPS
15.0000 mg | ORAL_CAPSULE | Freq: Every day | ORAL | 0 refills | Status: DC
Start: 2023-07-31 — End: 2023-08-31

## 2023-07-31 NOTE — Telephone Encounter (Signed)
Chart reviewed. Revlimid refilled per last office note with Dr. Katragadda.  

## 2023-08-10 ENCOUNTER — Encounter: Payer: Self-pay | Admitting: Gastroenterology

## 2023-08-10 ENCOUNTER — Encounter (HOSPITAL_COMMUNITY): Payer: Self-pay | Admitting: Hematology

## 2023-08-10 NOTE — Progress Notes (Signed)
GI Office Note    Referring Provider: [name], MD Primary Care Physician:  [name], MD  Primary Gastroenterologist: Roetta Sessions, MD   Chief Complaint   Chief Complaint  Patient presents with   Colonoscopy    Colonoscopy screening. Constipation sometimes, diarrhea sometimes and taking a iron pill because iron has been low.      History of Present Illness   Luis Stewart is a 72 y.o. male presenting today for consideration of a colonoscopy. He marked change in bowels/constipation on his questionnaire. His last colonoscopy was in 2018, he was advised 5 year surveillance colonoscopy at that time due to h/o colon polyps. Last seen in 2020. He has h/o GERD. 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 Anemia dating back to at least 01/2022.  Patient reports some change in bowel habits since he took 8 cycles of chemotherapy for multiple myeloma last year, finishing in November.  Completed stem cell harvesting but opted to hold off on bone marrow transplant until first relapse.  Currently on maintenance Revlimid.  He has not required any laxative use but sometimes the stools are harder to move, can skip 1 to 2 days at a time.  Sometimes stools are hard.  No flare of hemorrhoids.  At other times the stools are regular.  He may have diarrhea intermittently, he is not sure if it is medication related or something he is eating.  On those days he has typically 1-2 loose stools. Appetite ok. No brbpr. Stools dark on iron. Heartburn doing ok on PPI every other day. No dysphagia. Sometimes intermittent abdominal pain. Also has back pain and neuropathy so hard to tell sometimes. No unintentional weight loss.   EGD 06/2017: -mild gastritis  Colonoscopy 06/2017: -redundant colon -external and internal hemorrhoids -2mm polyp  removed from cecum  Small bowel capsule study 06/2017: -no overt gastric or small bowel blood, masses, polyps. Capsule stuck at cecal entrance with ?blood/erosion  Medications   Current Outpatient Medications  Medication Sig Dispense Refill   acetaminophen (TYLENOL) 500 MG tablet Take 1,000 mg by mouth every 6 (six) hours as needed for mild pain or moderate pain.     acyclovir (ZOVIRAX) 400 MG tablet TAKE 1 TABLET BY MOUTH TWICE A DAY 180 tablet 2   Cholecalciferol (VITAMIN D) 2000 units CAPS Take 2,000 Units by mouth every other day.     ferrous sulfate 325 (65 FE) MG EC tablet Take 325 mg by mouth daily.     lenalidomide (REVLIMID) 15 MG capsule Take 1 capsule (15 mg total) by mouth daily. Take for 21 days, then hold for 7 days. Repeat every 28 days. 21 capsule 0   Multiple Vitamin (MULTIVITAMIN WITH MINERALS) TABS tablet Take 1 tablet by mouth daily with breakfast. Centrum Silver for Men     nitroGLYCERIN (NITROSTAT) 0.4 MG SL tablet Place 1 tablet (0.4 mg total) under the tongue every 5 (five) minutes x 3 doses as needed for chest pain (If no relief after 3rd dose, call or go to ED.). 30 tablet 0   pantoprazole (PROTONIX) 40 MG tablet TAKE 1 TABLET BY MOUTH EVERY OTHER DAY 45 tablet 1   rosuvastatin (CRESTOR) 5 MG tablet Take 1 tablet (5 mg total) by mouth daily. 90 tablet 3   tamsulosin (FLOMAX) 0.4 MG CAPS capsule Take 0.4 mg by mouth every other day.  No current facility-administered medications for this visit.    Allergies   Allergies as of 08/11/2023 - Review Complete 08/11/2023  Allergen Reaction Noted   Sulfa antibiotics Other (See Comments) 05/26/2017    Past Medical History   Past Medical History:  Diagnosis Date   Arthritis    Asbestosis (HCC)    Closed fracture of distal end of fibula with tibia with routine healing 05/28/2020   COVID-19 09/03/2021   Cyst of kidney, acquired    Cystic disease of liver    Enlarged prostate    Headache    History of fracture  of leg    Right, childhood   Hx of migraine headaches    Hyperlipidemia    Multiple myeloma (HCC)    Neuropathy    PE (pulmonary thromboembolism) (HCC)    After knee surgery   Prostate cancer (HCC) 2015   Adenocarcinoma by biopsy    Past Surgical History   Past Surgical History:  Procedure Laterality Date   BIOPSY PROSTATE  2003 and 2005   COLONOSCOPY  11/28/2008   Dr. Rourk:internal hemorrhoids/tortus colon/ascending colon polyps, tubular adenomas   COLONOSCOPY N/A 09/04/2014   Procedure: COLONOSCOPY;  Surgeon: Corbin Ade, MD;  Location: [facility];  Service: Endoscopy;  Laterality: N/A;  9:30   COLONOSCOPY N/A 07/17/2017   Procedure: COLONOSCOPY;  Surgeon: West Bali, MD;  Location: [facility];  Service: Endoscopy;  Laterality: N/A;   ESOPHAGOGASTRODUODENOSCOPY N/A 07/16/2017   Procedure: ESOPHAGOGASTRODUODENOSCOPY (EGD);  Surgeon: West Bali, MD;  Location: [facility];  Service: Endoscopy;  Laterality: N/A;   GIVENS CAPSULE STUDY  07/16/2017   Procedure: GIVENS CAPSULE STUDY;  Surgeon: West Bali, MD;  Location: [facility];  Service: Endoscopy;;   JOINT REPLACEMENT N/A    Phreesia 01/12/2021   POLYPECTOMY  07/17/2017   Procedure: POLYPECTOMY;  Surgeon: West Bali, MD;  Location: [facility];  Service: Endoscopy;;  cecal   PORTACATH PLACEMENT Left 04/18/2022   Procedure: INSERTION PORT-A-CATH;  Surgeon: Franky Macho, MD;  Location: [facility];  Service: General;  Laterality: Left;   TOTAL KNEE ARTHROPLASTY Right 06/10/2017   TOTAL KNEE ARTHROPLASTY Right 06/10/2017   Procedure: TOTAL KNEE ARTHROPLASTY;  Surgeon: Loreta Ave, MD;  [facility];  Service: Orthopedics;  Laterality: Right;    Past Family History   Family History  Problem Relation Age of Onset   Diabetes Mother    Hypertension Mother    Obesity Mother    Heart disease Mother    Mental illness Mother    Hypertension Sister    Obesity Sister    Arthritis Sister    Deep  vein thrombosis Father    Dementia Father    Heart disease Father        CABG   Colon cancer Neg Hx     Past Social History   Social History   Socioeconomic History   Marital status: Married    Spouse name: Alvino Chapel ann   Number of children: Not on file   Years of education: Not on file   Highest education level: Not on file  Occupational History   Occupation: Retired  Tobacco Use   Smoking status: Never   Smokeless tobacco: Never  Vaping Use   Vaping status: Never Used  Substance and Sexual Activity   Alcohol use: No   Drug use: No   Sexual activity: Yes  Other Topics Concern   Not on file  Social History  Narrative   Previously worked at [employer]   Has a Engineer, 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   Social Determinants of Health   Financial Resource Strain: Low Risk  (06/01/2023)   Overall Financial Resource Strain (CARDIA)    Difficulty of Paying Living Expenses: Not hard at all  Food Insecurity: No Food Insecurity (06/01/2023)   Hunger Vital Sign    Worried About Running Out of Food in the Last Year: Never true    Ran Out of Food in the Last Year: Never true  Transportation Needs: No Transportation Needs (06/01/2023)   PRAPARE - Administrator, Civil Service (Medical): No    Lack of Transportation (Non-Medical): No  Physical Activity: Insufficiently Active (02/09/2023)   Exercise Vital Sign    Days of Exercise per Week: 5 days    Minutes of Exercise per Session: 20 min  Stress: No Stress Concern Present (06/01/2023)   [employer] of Occupational Health - Occupational Stress Questionnaire    Feeling of Stress : Not at all  Social Connections: Socially Integrated (06/01/2023)   Social Connection and Isolation Panel [NHANES]    Frequency of Communication with Friends and Family: More than three times a week    Frequency of Social Gatherings with Friends and Family: More than three times a week    Attends Religious Services: More  than 4 times per year    Active Member of [employer] or Organizations: Yes    Attends Engineer, structural: More than 4 times per year    Marital Status: Married  Catering manager Violence: Not At Risk (02/09/2023)   Humiliation, Afraid, Rape, and Kick questionnaire    Fear of Current or Ex-Partner: No    Emotionally Abused: No    Physically Abused: No    Sexually Abused: No    Review of Systems   General: Negative for anorexia, weight loss, fever, chills, fatigue, weakness. Eyes: Negative for vision changes.  ENT: Negative for hoarseness, difficulty swallowing , nasal congestion. CV: Negative for chest pain, angina, palpitations, dyspnea on exertion, peripheral edema.  Respiratory: Negative for dyspnea at rest, dyspnea on exertion, cough, sputum, wheezing.  GI: See history of present illness. GU:  Negative for dysuria, hematuria, urinary incontinence, urinary frequency, nocturnal urination.  MS: Negative for joint pain, low back pain.  Derm: Negative for rash or itching.  Neuro: Negative for weakness, abnormal sensation, seizure, frequent headaches, memory loss,  confusion.  Psych: Negative for anxiety, depression, suicidal ideation, hallucinations.  Endo: Negative for unusual weight change.  Heme: Negative for bruising or bleeding. Allergy: Negative for rash or hives.  Physical Exam   BP 131/81 (BP Location: Right Arm, Patient Position: Sitting, Cuff Size: Normal)   Pulse 67   Temp 97.7 F (36.5 C) (Temporal)   Ht 6\' 2"  (1.88 m)   Wt 203 lb 3.2 oz (92.2 kg)   SpO2 98%   BMI 26.09 kg/m    General: Well-nourished, well-developed in no acute distress.  Head: Normocephalic, atraumatic.   Eyes: Conjunctiva pink, no icterus. Mouth: Oropharyngeal mucosa moist and pink  Neck: Supple without thyromegaly, masses, or lymphadenopathy.  Lungs: Clear to auscultation bilaterally.  Heart: Regular rate and rhythm, no murmurs rubs or gallops.  Abdomen: Bowel sounds are normal,  nontender, nondistended, no hepatosplenomegaly or masses,  no abdominal bruits or hernia, no rebound or guarding.   Rectal: not performed Extremities: No lower extremity edema. No clubbing or deformities.  Neuro: Alert and oriented  x 4 , grossly normal neurologically.  Skin: Warm and dry, no rash or jaundice.   Psych: Alert and cooperative, normal mood and affect.  Labs   Lab Results  Component Value Date   NA 138 07/16/2023   CL 108 07/16/2023   K 3.6 07/16/2023   CO2 23 07/16/2023   BUN 26 (H) 07/16/2023   CREATININE 1.34 (H) 07/16/2023   GFRNONAA 56 (L) 07/16/2023   CALCIUM 8.3 (L) 07/16/2023   ALBUMIN 3.4 (L) 07/16/2023   GLUCOSE 109 (H) 07/16/2023   Lab Results  Component Value Date   ALT 25 07/16/2023   AST 23 07/16/2023   ALKPHOS 35 (L) 07/16/2023   BILITOT 0.9 07/16/2023   Lab Results  Component Value Date   WBC 3.1 (L) 07/16/2023   HGB 11.0 (L) 07/16/2023   HCT 32.9 (L) 07/16/2023   MCV 88.0 07/16/2023   PLT 198 07/16/2023   Lab Results  Component Value Date   IRON 47 07/16/2023   TIBC 441 07/16/2023   FERRITIN 11 (L) 07/16/2023   Lab Results  Component Value Date   VITAMINB12 449 02/26/2022   Lab Results  Component Value Date   FOLATE 15.3 02/26/2022    Imaging Studies   No results found.  Assessment   *H/O adenomatous colon polyps *Change in bowel habits *Alternating constipation/diarrhea  *IDA  Presents for surveillance colonoscopy for h/o adenomatous colon polyps. Also with some change in bowels with alternating constipation/diarrhea. Has had some iron deficiency as well with h/o multiple myeloma with treatments as outlined above. Unclear how much of bowel issues is medication related. Would update colonoscopy at this time to further evaluate.    PLAN   Start miralax 2 capfuls mixed in 12 ounces of water daily until soft stool, then decrease to once daily.  Colonoscopy to be scheduled. ASA 3.  I have discussed the risks, alternatives,  benefits with regards to but not limited to the risk of reaction to medication, bleeding, infection, perforation and the patient is agreeable to proceed. Written consent to be obtained.    Leanna Battles. Melvyn Neth, MHS, PA-C [facility]

## 2023-08-11 ENCOUNTER — Other Ambulatory Visit: Payer: Self-pay

## 2023-08-11 ENCOUNTER — Encounter: Payer: Self-pay | Admitting: Gastroenterology

## 2023-08-11 ENCOUNTER — Ambulatory Visit (INDEPENDENT_AMBULATORY_CARE_PROVIDER_SITE_OTHER): Payer: Medicare HMO | Admitting: Gastroenterology

## 2023-08-11 VITALS — BP 131/81 | HR 67 | Temp 97.7°F | Ht 74.0 in | Wt 203.2 lb

## 2023-08-11 DIAGNOSIS — Z86010 Personal history of colonic polyps: Secondary | ICD-10-CM

## 2023-08-11 DIAGNOSIS — K5900 Constipation, unspecified: Secondary | ICD-10-CM | POA: Diagnosis not present

## 2023-08-11 DIAGNOSIS — R198 Other specified symptoms and signs involving the digestive system and abdomen: Secondary | ICD-10-CM

## 2023-08-11 DIAGNOSIS — C61 Malignant neoplasm of prostate: Secondary | ICD-10-CM

## 2023-08-11 DIAGNOSIS — C9000 Multiple myeloma not having achieved remission: Secondary | ICD-10-CM

## 2023-08-11 NOTE — Patient Instructions (Addendum)
Start MiraLAX 2 capfuls mixed in 10 to 12 ounces of water each day until stools are soft.  Then decrease to 1 capful daily at least until your colonoscopy.  If your stools become too frequent, you can reduce dose to a half of a capful daily or 1 capful every other day. You will need to hold iron for one week before your colonoscopy. Colonoscopy to be scheduled.   It was a pleasure to see you today.  I truly value your feedback, so please be on the lookout for a survey regarding your visit with me today. I appreciate your time in completing this!

## 2023-08-19 ENCOUNTER — Encounter: Payer: Self-pay | Admitting: Internal Medicine

## 2023-08-19 ENCOUNTER — Ambulatory Visit (INDEPENDENT_AMBULATORY_CARE_PROVIDER_SITE_OTHER): Payer: Medicare HMO | Admitting: Internal Medicine

## 2023-08-19 VITALS — BP 132/77 | HR 55 | Ht 74.0 in | Wt 203.2 lb

## 2023-08-19 DIAGNOSIS — C9000 Multiple myeloma not having achieved remission: Secondary | ICD-10-CM | POA: Diagnosis not present

## 2023-08-19 DIAGNOSIS — E782 Mixed hyperlipidemia: Secondary | ICD-10-CM | POA: Diagnosis not present

## 2023-08-19 DIAGNOSIS — K219 Gastro-esophageal reflux disease without esophagitis: Secondary | ICD-10-CM

## 2023-08-19 DIAGNOSIS — M48062 Spinal stenosis, lumbar region with neurogenic claudication: Secondary | ICD-10-CM

## 2023-08-19 DIAGNOSIS — C61 Malignant neoplasm of prostate: Secondary | ICD-10-CM | POA: Diagnosis not present

## 2023-08-19 DIAGNOSIS — G609 Hereditary and idiopathic neuropathy, unspecified: Secondary | ICD-10-CM

## 2023-08-19 MED ORDER — DULOXETINE HCL 30 MG PO CPEP
30.0000 mg | ORAL_CAPSULE | Freq: Every day | ORAL | 5 refills | Status: DC
Start: 2023-08-19 — End: 2023-09-11

## 2023-08-19 NOTE — Assessment & Plan Note (Signed)
On Crestor now

## 2023-08-19 NOTE — Assessment & Plan Note (Signed)
On Revlimid and infusion chemotherapy Followed by Dr. Katragadda and Dr. McKay 

## 2023-08-19 NOTE — Assessment & Plan Note (Signed)
Has had prostate biopsy Downtrending PSA Follows up with Urologist - under PSA surveillance Did not tolerate Finasteride Followed by Oncology - On delarelix now

## 2023-08-19 NOTE — Patient Instructions (Signed)
Please start taking Cymbalta as prescribed.  Please continue to take medications as prescribed.  Please continue to follow low salt diet and perform moderate exercise/walking as tolerated.

## 2023-08-19 NOTE — Progress Notes (Signed)
Established Patient Office Visit  Subjective:  Patient ID: Luis Stewart, male    DOB: 07-15-1951  Age: 72 y.o. MRN: [id number]  CC:  Chief Complaint  Patient presents with   Gastroesophageal Reflux    Six month follow up    Peripheral Neuropathy    Six month follow up     HPI Luis Stewart is a 72 y.o. male with past medical history of GERD, MM, prostate cancer, chronic low back pain, elevated PSA, asbestosis and atypical chest pain who presents for f/u of his chronic medical conditions.  He has been taking Pantoprazole QOD for GERD. Currently denies any nausea, vomiting, dysphagia or odynophagia. C/o chronic constipation, but denies melena or hematochezia. Has tried OTC stool softener with some relief, but takes inconsistently.   Prostate cancer: Followed by urology and oncology.  He is on degarelix for it.  Denies any dysuria or hematuria currently.  Takes tamsulosin for urinary hesitancy.   Multiple myeloma: Followed by Dr. Ellin Saba and Dr. Anne Hahn.  He is on Revlimid and infusion chemotherapy for it. He has had stem cell collection at [facility].   His back pain had improved since starting chemotherapy. He has low back pain, intermittent, radiating to right LE and unrelated to activity.  He still has mild numbness of the LE. He tried Lyrica, but was not helpful. Denies any recent fall.   Past Medical History:  Diagnosis Date   Arthritis    Asbestosis (HCC)    Closed fracture of distal end of fibula with tibia with routine healing 05/28/2020   COVID-19 09/03/2021   Cyst of kidney, acquired    Cystic disease of liver    Enlarged prostate    Headache    History of fracture of leg    Right, childhood   Hx of migraine headaches    Hyperlipidemia    Multiple myeloma (HCC)    Neuropathy    PE (pulmonary thromboembolism) (HCC)    After knee surgery   Prostate cancer (HCC) 2015   Adenocarcinoma by biopsy    Past Surgical History:  Procedure  Laterality Date   BIOPSY PROSTATE  2003 and 2005   COLONOSCOPY  11/28/2008   Dr. Rourk:internal hemorrhoids/tortus colon/ascending colon polyps, tubular adenomas   COLONOSCOPY N/A 09/04/2014   Procedure: COLONOSCOPY;  Surgeon: Corbin Ade, MD;  [facility];  Service: Endoscopy;  Laterality: N/A;  9:30   COLONOSCOPY N/A 07/17/2017   Procedure: COLONOSCOPY;  Surgeon: West Bali, MD;  [facility];  Service: Endoscopy;  Laterality: N/A;   ESOPHAGOGASTRODUODENOSCOPY N/A 07/16/2017   Procedure: ESOPHAGOGASTRODUODENOSCOPY (EGD);  Surgeon: West Bali, MD;  [facility];  Service: Endoscopy;  Laterality: N/A;   GIVENS CAPSULE STUDY  07/16/2017   Procedure: GIVENS CAPSULE STUDY;  Surgeon: West Bali, MD;  [facility];  Service: Endoscopy;;   JOINT REPLACEMENT N/A    Phreesia 01/12/2021   POLYPECTOMY  07/17/2017   Procedure: POLYPECTOMY;  Surgeon: West Bali, MD;  [facility];  Service: Endoscopy;;  cecal   PORTACATH PLACEMENT Left 04/18/2022   Procedure: INSERTION PORT-A-CATH;  Surgeon: Franky Macho, MD;  Location: [facility];  Service: General;  Laterality: Left;   TOTAL KNEE ARTHROPLASTY Right 06/10/2017   TOTAL KNEE ARTHROPLASTY Right 06/10/2017   Procedure: TOTAL KNEE ARTHROPLASTY;  Surgeon: Loreta Ave, MD;  Location: [facility] OR;  Service: Orthopedics;  Laterality: Right;    Family History  Problem Relation  Age of Onset   Diabetes Mother    Hypertension Mother    Obesity Mother    Heart disease Mother    Mental illness Mother    Hypertension Sister    Obesity Sister    Arthritis Sister    Deep vein thrombosis Father    Dementia Father    Heart disease Father        CABG   Colon cancer Neg Hx     Social History   Socioeconomic History   Marital status: Married    Spouse name: Marvia Pickles   Number of children: Not on file   Years of education: Not on file   Highest education level: Not on file  Occupational  History   Occupation: Retired  Tobacco Use   Smoking status: Never   Smokeless tobacco: Never  Vaping Use   Vaping status: Never Used  Substance and Sexual Activity   Alcohol use: No   Drug use: No   Sexual activity: Yes  Other Topics Concern   Not on file  Social History Narrative   Previously worked at [employer]   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   Social Determinants of Health   Financial Resource Strain: Low Risk  (06/01/2023)   Overall Financial Resource Strain (CARDIA)    Difficulty of Paying Living Expenses: Not hard at all  Food Insecurity: No Food Insecurity (06/01/2023)   Hunger Vital Sign    Worried About Running Out of Food in the Last Year: Never true    Ran Out of Food in the Last Year: Never true  Transportation Needs: No Transportation Needs (06/01/2023)   PRAPARE - Administrator, Civil Service (Medical): No    Lack of Transportation (Non-Medical): No  Physical Activity: Insufficiently Active (02/09/2023)   Exercise Vital Sign    Days of Exercise per Week: 5 days    Minutes of Exercise per Session: 20 min  Stress: No Stress Concern Present (06/01/2023)   [employer] of Occupational Health - Occupational Stress Questionnaire    Feeling of Stress : Not at all  Social Connections: Socially Integrated (06/01/2023)   Social Connection and Isolation Panel [NHANES]    Frequency of Communication with Friends and Family: More than three times a week    Frequency of Social Gatherings with Friends and Family: More than three times a week    Attends Religious Services: More than 4 times per year    Active Member of [employer] or Organizations: Yes    Attends Engineer, structural: More than 4 times per year    Marital Status: Married  Catering manager Violence: Not At Risk (02/09/2023)   Humiliation, Afraid, Rape, and Kick questionnaire    Fear of Current or Ex-Partner: No    Emotionally Abused: No    Physically  Abused: No    Sexually Abused: No    Outpatient Medications Prior to Visit  Medication Sig Dispense Refill   acetaminophen (TYLENOL) 500 MG tablet Take 1,000 mg by mouth every 6 (six) hours as needed for mild pain or moderate pain.     acyclovir (ZOVIRAX) 400 MG tablet TAKE 1 TABLET BY MOUTH TWICE A DAY 180 tablet 2   Cholecalciferol (VITAMIN D) 2000 units CAPS Take 2,000 Units by mouth every other day.     ferrous sulfate 325 (65 FE) MG EC tablet Take 325 mg by mouth daily.     lenalidomide (REVLIMID) 15 MG capsule  Take 1 capsule (15 mg total) by mouth daily. Take for 21 days, then hold for 7 days. Repeat every 28 days. 21 capsule 0   Multiple Vitamin (MULTIVITAMIN WITH MINERALS) TABS tablet Take 1 tablet by mouth daily with breakfast. Centrum Silver for Men     nitroGLYCERIN (NITROSTAT) 0.4 MG SL tablet Place 1 tablet (0.4 mg total) under the tongue every 5 (five) minutes x 3 doses as needed for chest pain (If no relief after 3rd dose, call or go to ED.). 30 tablet 0   pantoprazole (PROTONIX) 40 MG tablet TAKE 1 TABLET BY MOUTH EVERY OTHER DAY 45 tablet 1   rosuvastatin (CRESTOR) 5 MG tablet Take 1 tablet (5 mg total) by mouth daily. 90 tablet 3   tamsulosin (FLOMAX) 0.4 MG CAPS capsule Take 0.4 mg by mouth every other day.     No facility-administered medications prior to visit.    Allergies  Allergen Reactions   Sulfa Antibiotics Other (See Comments)    Unknown reaction. Childhood reaction    ROS Review of Systems  Constitutional:  Negative for chills and fever.  HENT:  Negative for congestion, postnasal drip, sinus pressure and sore throat.   Eyes:  Negative for pain and discharge.  Respiratory:  Negative for cough and shortness of breath.   Cardiovascular:  Negative for chest pain and palpitations.  Gastrointestinal:  Negative for constipation, diarrhea, nausea and vomiting.  Endocrine: Negative for polydipsia and polyuria.  Genitourinary:  Negative for dysuria and hematuria.   Musculoskeletal:  Positive for back pain. Negative for neck pain and neck stiffness.  Skin:  Negative for rash.  Neurological:  Positive for numbness. Negative for dizziness, weakness and headaches.  Psychiatric/Behavioral:  Negative for agitation and behavioral problems.       Objective:    Physical Exam Vitals reviewed.  Constitutional:      General: He is not in acute distress.    Appearance: He is not diaphoretic.  HENT:     Head: Normocephalic and atraumatic.     Nose: No congestion.     Mouth/Throat:     Mouth: Mucous membranes are moist.  Eyes:     General: No scleral icterus.    Extraocular Movements: Extraocular movements intact.  Cardiovascular:     Rate and Rhythm: Normal rate and regular rhythm.     Pulses: Normal pulses.     Heart sounds: Normal heart sounds. No murmur heard. Pulmonary:     Breath sounds: Normal breath sounds. No wheezing or rales.  Musculoskeletal:     Cervical back: Neck supple. No tenderness.     Right lower leg: No edema.     Left lower leg: No edema.  Skin:    General: Skin is warm.     Findings: No rash.  Neurological:     General: No focal deficit present.     Mental Status: He is alert and oriented to person, place, and time.     Sensory: Sensory deficit (B/l feet) present.     Motor: No weakness.  Psychiatric:        Mood and Affect: Mood normal.        Behavior: Behavior normal.     BP 132/77 (BP Location: Right Arm, Patient Position: Sitting, Cuff Size: Large)   Pulse (!) 55   Ht 6\' 2"  (1.88 m)   Wt 203 lb 3.2 oz (92.2 kg)   SpO2 96%   BMI 26.09 kg/m  Wt Readings from Last 3 Encounters:  08/19/23  203 lb 3.2 oz (92.2 kg)  08/11/23 203 lb 3.2 oz (92.2 kg)  07/23/23 202 lb 6.4 oz (91.8 kg)    Lab Results  Component Value Date   TSH 4.070 02/06/2022   Lab Results  Component Value Date   WBC 3.1 (L) 07/16/2023   HGB 11.0 (L) 07/16/2023   HCT 32.9 (L) 07/16/2023   MCV 88.0 07/16/2023   PLT 198 07/16/2023   Lab  Results  Component Value Date   NA 138 07/16/2023   K 3.6 07/16/2023   CO2 23 07/16/2023   GLUCOSE 109 (H) 07/16/2023   BUN 26 (H) 07/16/2023   CREATININE 1.34 (H) 07/16/2023   BILITOT 0.9 07/16/2023   ALKPHOS 35 (L) 07/16/2023   AST 23 07/16/2023   ALT 25 07/16/2023   PROT 5.8 (L) 07/16/2023   ALBUMIN 3.4 (L) 07/16/2023   CALCIUM 8.3 (L) 07/16/2023   ANIONGAP 7 07/16/2023   EGFR 48 (L) 02/06/2022   Lab Results  Component Value Date   CHOL 187 06/04/2023   Lab Results  Component Value Date   HDL 61 06/04/2023   Lab Results  Component Value Date   LDLCALC 112 (H) 06/04/2023   Lab Results  Component Value Date   TRIG 78 06/04/2023   Lab Results  Component Value Date   CHOLHDL 3.1 06/04/2023   Lab Results  Component Value Date   HGBA1C 5.6 02/18/2023      Assessment & Plan:    Problem List Items Addressed This Visit       Digestive   GERD (gastroesophageal reflux disease)    Well-controlled with Pantoprazole 40 mg QOD        Nervous and Auditory   Idiopathic peripheral neuropathy    Could be due to lumbar spinal stenosis or MM related pelvic mass or chemotherapy-induced He prefers to avoid Gabapentin, and tried Lyrica without much benefit Started Cymbalta 30 mg ONCE DAILY - increase dose as tolerated      Relevant Medications   DULoxetine (CYMBALTA) 30 MG capsule     Genitourinary   Prostate cancer (HCC)    Has had prostate biopsy Downtrending PSA Follows up with Urologist - under PSA surveillance Did not tolerate Finasteride Followed by Oncology - On delarelix now        Other   Hyperlipidemia    On Crestor now      Multiple myeloma (HCC) - Primary    On Revlimid and infusion chemotherapy Followed by Dr. Ellin Saba and Dr. Anne Hahn      Spinal stenosis of lumbar region with neurogenic claudication    Low back pain improved since starting chemotherapy Has been intermittent numbness of the LE Previous MRI of lumbar spine (12/22) showed  metastatic mass and lumbar spinal stenosis Has tried Lyrica for neuropathy symptoms without much benefit,  prefers to avoid gabapentin Trial of Cymbalta 30 mg QD - increase dose if tolerated       Relevant Medications   DULoxetine (CYMBALTA) 30 MG capsule      Meds ordered this encounter  Medications   DULoxetine (CYMBALTA) 30 MG capsule    Sig: Take 1 capsule (30 mg total) by mouth daily.    Dispense:  30 capsule    Refill:  5    Follow-up: Return in about 6 months (around 02/19/2024) for Annual physical.    [name], MD

## 2023-08-19 NOTE — Assessment & Plan Note (Signed)
Low back pain improved since starting chemotherapy Has been intermittent numbness of the LE Previous MRI of lumbar spine (12/22) showed metastatic mass and lumbar spinal stenosis Has tried Lyrica for neuropathy symptoms without much benefit,  prefers to avoid gabapentin Trial of Cymbalta 30 mg QD - increase dose if tolerated

## 2023-08-19 NOTE — Assessment & Plan Note (Addendum)
Well-controlled with Pantoprazole 40 mg QOD

## 2023-08-19 NOTE — Assessment & Plan Note (Signed)
Could be due to lumbar spinal stenosis or MM related pelvic mass or chemotherapy-induced He prefers to avoid Gabapentin, and tried Lyrica without much benefit Started Cymbalta 30 mg ONCE DAILY - increase dose as tolerated

## 2023-08-20 ENCOUNTER — Inpatient Hospital Stay: Payer: Medicare HMO

## 2023-08-20 ENCOUNTER — Ambulatory Visit: Payer: Self-pay | Admitting: *Deleted

## 2023-08-20 DIAGNOSIS — C9000 Multiple myeloma not having achieved remission: Secondary | ICD-10-CM

## 2023-08-20 DIAGNOSIS — C61 Malignant neoplasm of prostate: Secondary | ICD-10-CM

## 2023-08-20 LAB — COMPREHENSIVE METABOLIC PANEL
ALT: 22 U/L (ref 0–44)
AST: 17 U/L (ref 15–41)
Albumin: 3.5 g/dL (ref 3.5–5.0)
Alkaline Phosphatase: 31 U/L — ABNORMAL LOW (ref 38–126)
Anion gap: 4 — ABNORMAL LOW (ref 5–15)
BUN: 13 mg/dL (ref 8–23)
CO2: 28 mmol/L (ref 22–32)
Calcium: 8.4 mg/dL — ABNORMAL LOW (ref 8.9–10.3)
Chloride: 108 mmol/L (ref 98–111)
Creatinine, Ser: 1.1 mg/dL (ref 0.61–1.24)
GFR, Estimated: >60 mL/min (ref >60)
Glucose, Bld: 92 mg/dL (ref 70–99)
Potassium: 3.6 mmol/L (ref 3.5–5.1)
Sodium: 140 mmol/L (ref 135–145)
Total Bilirubin: 0.5 mg/dL (ref 0.3–1.2)
Total Protein: 5.5 g/dL — ABNORMAL LOW (ref 6.5–8.1)

## 2023-08-20 MED ORDER — SODIUM CHLORIDE 0.9% FLUSH
10.0000 mL | INTRAVENOUS | Status: DC | PRN
  Administered 2023-08-20: 10 mL via INTRAVENOUS

## 2023-08-20 MED ORDER — HEPARIN SOD (PORK) LOCK FLUSH 100 UNIT/ML IV SOLN
500.0000 [IU] | Freq: Once | INTRAVENOUS | Status: AC
  Administered 2023-08-20: 500 [IU] via INTRAVENOUS

## 2023-08-20 MED ORDER — DENOSUMAB 120 MG/1.7ML ~~LOC~~ SOLN
120.0000 mg | Freq: Once | SUBCUTANEOUS | Status: AC
  Administered 2023-08-20: 120 mg via SUBCUTANEOUS
  Filled 2023-08-20: qty 1.7

## 2023-08-20 NOTE — Patient Outreach (Signed)
Care Coordination   Follow Up Visit Note   09/17/2023 updated note for 08/20/23  Name: Luis Stewart MRN: [id number] DOB: January 21, 1951  Luis Stewart is a 72 y.o. year old male who sees [name], MD for primary care. I spoke with  [name] by phone today.  What matters to the patients health and wellness today?  Attending an oncology visit for his port flushing today PCP seen and Cymbalta was ordered related to his pain management treatment plan  Upcoming colonoscopy. Had several and familiar with the process Next outreach agreed upon   Goals Addressed             This Visit's Progress    (multiple myeloma, pain, prostate) care coordination services   On track    Interventions Today    Flowsheet Row Most Recent Value  Chronic Disease   Chronic disease during today's visit Other  [having port flushed today, pain]  General Interventions   General Interventions Discussed/Reviewed General Interventions Reviewed, Durable Medical Equipment (DME), Doctor Visits  Doctor Visits Discussed/Reviewed Doctor Visits Reviewed, Specialist  Durable Medical Equipment (DME) Other  [port flushes]  PCP/Specialist Visits Compliance with follow-up visit  Pharmacy Interventions   Pharmacy Dicussed/Reviewed Pharmacy Topics Reviewed, Medications and their functions  [discussed medications for pain- cymbalta]              SDOH assessments and interventions completed:  No     Care Coordination Interventions:  Yes, provided   Follow up plan: Follow up call scheduled for 09/17/23 1130    Encounter Outcome:  Pt. Visit Completed   Aranza Geddes L. Noelle Penner, RN, BSN, CCM, Care Management Coordinator [phone]

## 2023-08-20 NOTE — Progress Notes (Signed)
Luis Stewart presents today for injection per the provider's orders.  Xgeva 120 mg administration without incident; injection site WNL; see MAR for injection details.  Patient tolerated procedure well and without incident.  No questions or complaints noted at this time. Pt's Calcium noted to be 8.4 today.  Pt denies any tooth or jaw pain and no recent or future dental appointments at this time. Pt reports taking Calcium and Vit D supplements as directed.  Discharged from clinic ambulatory in stable condition. Alert and oriented x 3. F/U with [facility] as scheduled.

## 2023-08-31 ENCOUNTER — Other Ambulatory Visit: Payer: Self-pay

## 2023-08-31 DIAGNOSIS — C9000 Multiple myeloma not having achieved remission: Secondary | ICD-10-CM

## 2023-08-31 MED ORDER — LENALIDOMIDE 15 MG PO CAPS
15.0000 mg | ORAL_CAPSULE | Freq: Every day | ORAL | 0 refills | Status: DC
Start: 2023-08-31 — End: 2023-09-28

## 2023-08-31 NOTE — Telephone Encounter (Signed)
Chart reviewed. Revlimid refilled per last office note with Dr. Katragadda.  

## 2023-09-07 ENCOUNTER — Telehealth: Payer: Self-pay | Admitting: *Deleted

## 2023-09-07 NOTE — Telephone Encounter (Signed)
Pt called back. He could not do 10/9. He scheduled for 10/14. He will come by office and pick up clenpiq sample. Aware will send instructions and pre-op via mychart.

## 2023-09-07 NOTE — Telephone Encounter (Signed)
LMOVM to call back to schedule TCS with Dr. Jena Gauss ASA 3, hold iron x 7 days, wants clenpiq.  Can't do after 10/15. Holding 10/9 at 11:15 to see if he can do this date/time.

## 2023-09-08 ENCOUNTER — Encounter: Payer: Self-pay | Admitting: *Deleted

## 2023-09-09 ENCOUNTER — Ambulatory Visit (INDEPENDENT_AMBULATORY_CARE_PROVIDER_SITE_OTHER): Payer: Medicare HMO

## 2023-09-09 DIAGNOSIS — Z23 Encounter for immunization: Secondary | ICD-10-CM | POA: Diagnosis not present

## 2023-09-11 ENCOUNTER — Other Ambulatory Visit: Payer: Self-pay | Admitting: Internal Medicine

## 2023-09-11 DIAGNOSIS — M48062 Spinal stenosis, lumbar region with neurogenic claudication: Secondary | ICD-10-CM

## 2023-09-17 ENCOUNTER — Ambulatory Visit: Payer: Self-pay | Admitting: *Deleted

## 2023-09-17 NOTE — Patient Outreach (Signed)
Care Coordination   09/17/2023 Name: Luis Stewart MRN: [id number] DOB: 09-30-1951   Care Coordination Outreach Attempts:  An unsuccessful telephone outreach was attempted for a scheduled appointment today.  Follow Up Plan:  Additional outreach attempts will be made to offer the patient care coordination information and services.   Encounter Outcome:  No Answer   Care Coordination Interventions:  No, not indicated     Fredonia Casalino L. Noelle Penner, RN, BSN, CCM, Care Management Coordinator [phone]

## 2023-09-17 NOTE — Patient Instructions (Signed)
Visit Information  Thank you for taking time to visit with me today. Please don't hesitate to contact me if I can be of assistance to you.   Following are the goals we discussed today:   Goals Addressed             This Visit's Progress    (multiple myeloma, pain, prostate) care coordination services   On track    Interventions Today    Flowsheet Row Most Recent Value  Chronic Disease   Chronic disease during today's visit Other  [having port flushed today, pain]  General Interventions   General Interventions Discussed/Reviewed General Interventions Reviewed, Durable Medical Equipment (DME), Doctor Visits  Doctor Visits Discussed/Reviewed Doctor Visits Reviewed, Specialist  Durable Medical Equipment (DME) Other  [port flushes]  PCP/Specialist Visits Compliance with follow-up visit  Pharmacy Interventions   Pharmacy Dicussed/Reviewed Pharmacy Topics Reviewed, Medications and their functions  [discussed medications for pain- cymbalta]              Our next appointment is by telephone on 09/17/23 at 1130  Please call the care guide team at [phone] if you need to cancel or reschedule your appointment.   If you are experiencing a Mental Health or Behavioral Health Crisis or need someone to talk to, please call the Suicide and Crisis Lifeline: 988 call the [place] National Suicide Prevention Lifeline: (435)451-1916 or TTY: 305-444-4117 TTY 438-176-1020) to talk to a trained counselor call 1-800-273-TALK (toll free, 24 hour hotline) call the [facility]: 416-135-9369 call 911   Patient verbalizes understanding of instructions and care plan provided today and agrees to view in MyChart. Active MyChart status and patient understanding of how to access instructions and care plan via MyChart confirmed with patient.     The patient has been provided with contact information for the care management team and has been advised to call with any health related  questions or concerns.   Sandhya Denherder L. Noelle Penner, RN, BSN, CCM, Care Management Coordinator [phone]

## 2023-09-24 ENCOUNTER — Inpatient Hospital Stay: Payer: Medicare HMO | Attending: Hematology

## 2023-09-24 DIAGNOSIS — C9000 Multiple myeloma not having achieved remission: Secondary | ICD-10-CM | POA: Diagnosis present

## 2023-09-24 DIAGNOSIS — Z5111 Encounter for antineoplastic chemotherapy: Secondary | ICD-10-CM | POA: Diagnosis present

## 2023-09-24 DIAGNOSIS — Z79899 Other long term (current) drug therapy: Secondary | ICD-10-CM | POA: Diagnosis not present

## 2023-09-24 DIAGNOSIS — D508 Other iron deficiency anemias: Secondary | ICD-10-CM

## 2023-09-24 DIAGNOSIS — C61 Malignant neoplasm of prostate: Secondary | ICD-10-CM | POA: Insufficient documentation

## 2023-09-24 LAB — COMPREHENSIVE METABOLIC PANEL
ALT: 27 U/L (ref 0–44)
AST: 33 U/L (ref 15–41)
Albumin: 3.6 g/dL (ref 3.5–5.0)
Alkaline Phosphatase: 32 U/L — ABNORMAL LOW (ref 38–126)
Anion gap: 9 (ref 5–15)
BUN: 19 mg/dL (ref 8–23)
CO2: 25 mmol/L (ref 22–32)
Calcium: 8.4 mg/dL — ABNORMAL LOW (ref 8.9–10.3)
Chloride: 104 mmol/L (ref 98–111)
Creatinine, Ser: 1.14 mg/dL (ref 0.61–1.24)
GFR, Estimated: >60 mL/min (ref >60)
Glucose, Bld: 102 mg/dL — ABNORMAL HIGH (ref 70–99)
Potassium: 3.4 mmol/L — ABNORMAL LOW (ref 3.5–5.1)
Sodium: 138 mmol/L (ref 135–145)
Total Bilirubin: 0.4 mg/dL (ref 0.3–1.2)
Total Protein: 6 g/dL — ABNORMAL LOW (ref 6.5–8.1)

## 2023-09-24 LAB — CBC WITH DIFFERENTIAL/PLATELET
Abs Immature Granulocytes: 0.01 10*3/uL (ref 0.00–0.07)
Basophils Absolute: 0 10*3/uL (ref 0.0–0.1)
Basophils Relative: 1 %
Eosinophils Absolute: 0.3 10*3/uL (ref 0.0–0.5)
Eosinophils Relative: 7 %
HCT: 33.5 % — ABNORMAL LOW (ref 39.0–52.0)
Hemoglobin: 11.2 g/dL — ABNORMAL LOW (ref 13.0–17.0)
Immature Granulocytes: 0 %
Lymphocytes Relative: 19 %
Lymphs Abs: 0.7 10*3/uL (ref 0.7–4.0)
MCH: 30.1 pg (ref 26.0–34.0)
MCHC: 33.4 g/dL (ref 30.0–36.0)
MCV: 90.1 fL (ref 80.0–100.0)
Monocytes Absolute: 0.4 10*3/uL (ref 0.1–1.0)
Monocytes Relative: 11 %
Neutro Abs: 2.1 10*3/uL (ref 1.7–7.7)
Neutrophils Relative %: 62 %
Platelets: 120 10*3/uL — ABNORMAL LOW (ref 150–400)
RBC: 3.72 MIL/uL — ABNORMAL LOW (ref 4.22–5.81)
RDW: 16.3 % — ABNORMAL HIGH (ref 11.5–15.5)
WBC: 3.4 10*3/uL — ABNORMAL LOW (ref 4.0–10.5)
nRBC: 0 % (ref 0.0–0.2)

## 2023-09-24 LAB — MAGNESIUM: Magnesium: 1.9 mg/dL (ref 1.7–2.4)

## 2023-09-24 LAB — IRON AND TIBC
Iron: 57 ug/dL (ref 45–182)
Saturation Ratios: 15 % — ABNORMAL LOW (ref 17.9–39.5)
TIBC: 392 ug/dL (ref 250–450)
UIBC: 335 ug/dL

## 2023-09-24 LAB — FERRITIN: Ferritin: 49 ng/mL (ref 24–336)

## 2023-09-24 MED ORDER — HEPARIN SOD (PORK) LOCK FLUSH 100 UNIT/ML IV SOLN
500.0000 [IU] | Freq: Once | INTRAVENOUS | Status: AC
  Administered 2023-09-24: 500 [IU] via INTRAVENOUS

## 2023-09-24 MED ORDER — SODIUM CHLORIDE 0.9% FLUSH
10.0000 mL | Freq: Once | INTRAVENOUS | Status: AC
  Administered 2023-09-24: 10 mL via INTRAVENOUS

## 2023-09-24 NOTE — Patient Instructions (Signed)
[facility]  Discharge Instructions: Thank you for choosing [facility] to provide your oncology and hematology care.  If you have a lab appointment with the Cancer Center - please note that after April 8th, 2024, all labs will be drawn in the cancer center.  You do not have to check in or register with the main entrance as you have in the past but will complete your check-in in the cancer center.  Wear comfortable clothing and clothing appropriate for easy access to any Portacath or PICC line.   We strive to give you quality time with your provider. You may need to reschedule your appointment if you arrive late (15 or more minutes).  Arriving late affects you and other patients whose appointments are after yours.  Also, if you miss three or more appointments without notifying the office, you may be dismissed from the clinic at the provider's discretion.      For prescription refill requests, have your pharmacy contact our office and allow 72 hours for refills to be completed.    Today you received the following chemotherapy and/or immunotherapy agents port flush labs      To help prevent nausea and vomiting after your treatment, we encourage you to take your nausea medication as directed.  BELOW ARE SYMPTOMS THAT SHOULD BE REPORTED IMMEDIATELY: *FEVER GREATER THAN 100.4 F (38 C) OR HIGHER *CHILLS OR SWEATING *NAUSEA AND VOMITING THAT IS NOT CONTROLLED WITH YOUR NAUSEA MEDICATION *UNUSUAL SHORTNESS OF BREATH *UNUSUAL BRUISING OR BLEEDING *URINARY PROBLEMS (pain or burning when urinating, or frequent urination) *BOWEL PROBLEMS (unusual diarrhea, constipation, pain near the anus) TENDERNESS IN MOUTH AND THROAT WITH OR WITHOUT PRESENCE OF ULCERS (sore throat, sores in mouth, or a toothache) UNUSUAL RASH, SWELLING OR PAIN  UNUSUAL VAGINAL DISCHARGE OR ITCHING   Items with * indicate a potential emergency and should be followed up as soon as possible or go to  the Emergency Department if any problems should occur.  Please show the CHEMOTHERAPY ALERT CARD or IMMUNOTHERAPY ALERT CARD at check-in to the Emergency Department and triage nurse.  Should you have questions after your visit or need to cancel or reschedule your appointment, please contact [facility] [phone]  and follow the prompts.  Office hours are 8:00 a.m. to 4:30 p.m. Monday - Friday. Please note that voicemails left after 4:00 p.m. may not be returned until the following business day.  We are closed weekends and major holidays. You have access to a nurse at all times for urgent questions. Please call the main number to the clinic [phone] and follow the prompts.  For any non-urgent questions, you may also contact your provider using MyChart. We now offer e-Visits for anyone 6 and older to request care online for non-urgent symptoms. For details visit [email or web address].   Also download the MyChart app! Go to the app store, search "MyChart", open the app, select [place], and log in with your MyChart username and password.

## 2023-09-24 NOTE — Progress Notes (Signed)
Patients port flushed without difficulty.  Good blood return noted with no bruising or swelling noted at site.  Band aid applied.  VSS with discharge and left in satisfactory condition with no s/s of distress noted.   

## 2023-09-27 LAB — PROTEIN ELECTROPHORESIS, SERUM
A/G Ratio: 1.8 — ABNORMAL HIGH (ref 0.7–1.7)
Albumin ELP: 3.4 g/dL (ref 2.9–4.4)
Alpha-1-Globulin: 0.2 g/dL (ref 0.0–0.4)
Alpha-2-Globulin: 0.5 g/dL (ref 0.4–1.0)
Beta Globulin: 0.8 g/dL (ref 0.7–1.3)
Gamma Globulin: 0.3 g/dL — ABNORMAL LOW (ref 0.4–1.8)
Globulin, Total: 1.9 g/dL — ABNORMAL LOW (ref 2.2–3.9)
M-Spike, %: 0.1 g/dL — ABNORMAL HIGH
Total Protein ELP: 5.3 g/dL — ABNORMAL LOW (ref 6.0–8.5)

## 2023-09-27 LAB — IMMUNOFIXATION ELECTROPHORESIS
IgA: 19 mg/dL — ABNORMAL LOW (ref 61–437)
IgG (Immunoglobin G), Serum: 433 mg/dL — ABNORMAL LOW (ref 603–1613)
IgM (Immunoglobulin M), Srm: 14 mg/dL — ABNORMAL LOW (ref 15–143)
Total Protein ELP: 5.2 g/dL — ABNORMAL LOW (ref 6.0–8.5)

## 2023-09-28 ENCOUNTER — Other Ambulatory Visit: Payer: Self-pay

## 2023-09-28 ENCOUNTER — Ambulatory Visit: Payer: Self-pay | Admitting: *Deleted

## 2023-09-28 DIAGNOSIS — C9000 Multiple myeloma not having achieved remission: Secondary | ICD-10-CM

## 2023-09-28 LAB — KAPPA/LAMBDA LIGHT CHAINS
Kappa free light chain: 8.1 mg/L (ref 3.3–19.4)
Kappa, lambda light chain ratio: 1.84 — ABNORMAL HIGH (ref 0.26–1.65)
Lambda free light chains: 4.4 mg/L — ABNORMAL LOW (ref 5.7–26.3)

## 2023-09-28 MED ORDER — LENALIDOMIDE 15 MG PO CAPS
15.0000 mg | ORAL_CAPSULE | Freq: Every day | ORAL | 0 refills | Status: DC
Start: 2023-09-28 — End: 2023-10-22

## 2023-09-28 NOTE — Patient Outreach (Signed)
Care Coordination   Follow Up Visit Note   09/28/2023 Name: Abraheem Marck MRN: [id number] DOB: [birth year]  Levada Schilling Faver is a 72 y.o. year old male who sees [name], MD for primary care. I spoke with  [name] by phone today.  What matters to the patients health and wellness today?  Colonoscopy, back pain  Risky to do back surgery related for myeloma Cymbalta started on 08/19/23 for his back pain patient reports he is feeling better with Cymbalta use Stopped chemo and so he can obtain his colonoscopy    Goals Addressed             This Visit's Progress    (multiple myeloma, pain, prostate) care coordination services        Interventions Today    Flowsheet Row Most Recent Value  Chronic Disease   Chronic disease during today's visit Other  [colonoscopy, myeloma, back pain]  General Interventions   General Interventions Discussed/Reviewed General Interventions Reviewed, Doctor Visits  Doctor Visits Discussed/Reviewed Doctor Visits Reviewed, PCP, Specialist  PCP/Specialist Visits Compliance with follow-up visit  Exercise Interventions   Exercise Discussed/Reviewed Exercise Reviewed, Physical Activity  Education Interventions   Education Provided Provided Education  Provided Verbal Education On Other, Medication  [cymbalta use for pain]  Mental Health Interventions   Mental Health Discussed/Reviewed Mental Health Reviewed, Coping Strategies  Nutrition Interventions   Nutrition Discussed/Reviewed Nutrition Discussed, Fluid intake  Pharmacy Interventions   Pharmacy Dicussed/Reviewed Pharmacy Topics Reviewed, Medications and their functions, Affording Medications                SDOH assessments and interventions completed:  No     Care Coordination Interventions:  Yes, provided   Follow up plan: Follow up call scheduled for 10/26/23    Encounter Outcome:  Patient Visit Completed   Cala Bradford L. Noelle Penner, RN,  BSN, [facility]  [facility] Care Management Coordinator  [phone]  Fax: [phone]

## 2023-09-28 NOTE — Telephone Encounter (Signed)
Chart reviewed. Revlimid refilled per last office note with Dr. Katragadda.  

## 2023-09-28 NOTE — Patient Instructions (Addendum)
Visit Information  Thank you for taking time to visit with me today. Please don't hesitate to contact me if I can be of assistance to you.   Following are the goals we discussed today:   Goals Addressed             This Visit's Progress    (multiple myeloma, pain, prostate) care coordination services       Interventions Today    Flowsheet Row Most Recent Value  Chronic Disease   Chronic disease during today's visit Other  [colonoscopy, myeloma, back pain]  General Interventions   General Interventions Discussed/Reviewed General Interventions Reviewed, Doctor Visits  Doctor Visits Discussed/Reviewed Doctor Visits Reviewed, PCP, Specialist  PCP/Specialist Visits Compliance with follow-up visit  Exercise Interventions   Exercise Discussed/Reviewed Exercise Reviewed, Physical Activity  Education Interventions   Education Provided Provided Web-based Education, Provided Education  [managing chronic pain]  Provided Verbal Education On Other, Medication  [cymbalta use for pain]  Mental Health Interventions   Mental Health Discussed/Reviewed Mental Health Reviewed, Coping Strategies  Nutrition Interventions   Nutrition Discussed/Reviewed Nutrition Discussed, Fluid intake  Pharmacy Interventions   Pharmacy Dicussed/Reviewed Pharmacy Topics Reviewed, Medications and their functions, Affording Medications      Interventions Today    Flowsheet Row Most Recent Value  Chronic Disease   Chronic disease during today's visit Other  [colonoscopy, myeloma, back pain]  General Interventions   General Interventions Discussed/Reviewed General Interventions Reviewed, Doctor Visits  Doctor Visits Discussed/Reviewed Doctor Visits Reviewed, PCP, Specialist  PCP/Specialist Visits Compliance with follow-up visit  Exercise Interventions   Exercise Discussed/Reviewed Exercise Reviewed, Physical Activity  Education Interventions   Education Provided Provided Web-based Education, Provided Education   [managing chronic pain]  Provided Verbal Education On Other, Medication  [cymbalta use for pain]  Mental Health Interventions   Mental Health Discussed/Reviewed Mental Health Reviewed, Coping Strategies  Nutrition Interventions   Nutrition Discussed/Reviewed Nutrition Discussed, Fluid intake  Pharmacy Interventions   Pharmacy Dicussed/Reviewed Pharmacy Topics Reviewed, Medications and their functions, Affording Medications                Our next appointment is by telephone on 10/26/23 at 1000  Please call the care guide team at [phone] if you need to cancel or reschedule your appointment.   If you are experiencing a Mental Health or Behavioral Health Crisis or need someone to talk to, please call the Suicide and Crisis Lifeline: 988 call the [place] National Suicide Prevention Lifeline: (434) 215-5180 or TTY: 321 845 0892 TTY 940-538-0417) to talk to a trained counselor call 1-800-273-TALK (toll free, 24 hour hotline) call the [facility]: 715-071-0442 call 911   Patient verbalizes understanding of instructions and care plan provided today and agrees to view in MyChart. Active MyChart status and patient understanding of how to access instructions and care plan via MyChart confirmed with patient.     The patient has been provided with contact information for the care management team and has been advised to call with any health related questions or concerns.    Debanhi Blaker L. Noelle Penner, RN, BSN, [facility]  VBCI Care Management Coordinator  [phone]  Fax: [phone]

## 2023-10-01 ENCOUNTER — Inpatient Hospital Stay: Payer: Medicare HMO | Admitting: Hematology

## 2023-10-01 ENCOUNTER — Inpatient Hospital Stay: Payer: Medicare HMO

## 2023-10-01 VITALS — BP 129/81 | HR 59 | Temp 97.9°F | Resp 17 | Ht 74.0 in | Wt 200.8 lb

## 2023-10-01 DIAGNOSIS — C9000 Multiple myeloma not having achieved remission: Secondary | ICD-10-CM | POA: Diagnosis not present

## 2023-10-01 DIAGNOSIS — C61 Malignant neoplasm of prostate: Secondary | ICD-10-CM

## 2023-10-01 DIAGNOSIS — Z5111 Encounter for antineoplastic chemotherapy: Secondary | ICD-10-CM | POA: Diagnosis not present

## 2023-10-01 DIAGNOSIS — D508 Other iron deficiency anemias: Secondary | ICD-10-CM

## 2023-10-01 LAB — PSA: Prostatic Specific Antigen: 0.57 ng/mL (ref 0.00–4.00)

## 2023-10-01 MED ORDER — LEUPROLIDE ACETATE (6 MONTH) 45 MG ~~LOC~~ KIT
45.0000 mg | PACK | Freq: Once | SUBCUTANEOUS | Status: AC
  Administered 2023-10-01: 45 mg via SUBCUTANEOUS
  Filled 2023-10-01: qty 45

## 2023-10-01 MED ORDER — DENOSUMAB 120 MG/1.7ML ~~LOC~~ SOLN
120.0000 mg | Freq: Once | SUBCUTANEOUS | Status: AC
  Administered 2023-10-01: 120 mg via SUBCUTANEOUS
  Filled 2023-10-01: qty 1.7

## 2023-10-01 NOTE — Progress Notes (Signed)
Patient is taking Revlimid as prescribed.  He has not missed any doses and reports no side effects at this time.   

## 2023-10-01 NOTE — Progress Notes (Signed)
Patient denies any jaw pain , no issues with his teeth. Not having any major dental work at this time.   Xgeva and Eligard injections given per orders. Patient tolerated it well without problems. Vitals stable and discharged home from clinic ambulatory. Follow up as scheduled.

## 2023-10-01 NOTE — Progress Notes (Signed)
[facility] [address] [place] [address]    Clinic Day:  10/01/23   Referring physician: [name], MD  Patient Care Team: [name], MD as PCP - General (Internal Medicine) Jonelle Sidle, MD as PCP - Cardiology (Cardiology) Jena Gauss Gerrit Friends, MD as Consulting Physician (Gastroenterology) Doreatha Massed, MD as Medical Oncologist (Medical Oncology) Therese Sarah, RN as Oncology Nurse Navigator (Medical Oncology) Clinton Gallant, RN as [facility] Care Management   ASSESSMENT & PLAN:   Assessment: 1. Prostate cancer: - TRUS/Bx on 06/14/2015: 1/12 cores positive for adenocarcinoma-10% of the core in the right lateral base revealed Gleason 3+3=6.  Prostate volume 175 cc.  PSA was 16.6.  PSA density 0.09. - Seen by Dr. Marlou Porch in [facility] for robotic prostatectomy.  Due to low-volume prostate cancer and very low PSA density, it was recommended that he continue active surveillance. - 01/21/2016: Surveillance TRUS/biopsy, following a prostatic MRI which revealed no suspicious prostatic lesions and correlated the patient's large prostate.  All 12 cores were negative for prostate cancer.  Prostate volume was 190 mL. - 08/11/2018: Fusion TRUS/biopsy: Recent prostate MRI-volume 220 mL, no significant lesions.  No evidence of transcapsular/.  Ureteral, SV/bony or lymphatic disease.  Path-1 core (left apex) revealed Gleason 3+3 and 5% of core. - PSA: 17.16 (10/09/2015), 15.4 (11/04/2016), 17.9 (05/15/2017), 18.4 (04/19/2018), 15.9 (02/15/2019), 15.5 (03/20/2020), 22.4 (03/20/2021) - MRI of the lumbar spine with and without contrast on 11/27/2021 done for back pain showed expanded appearance of the sacrum with diffusely abnormal bone marrow signal and contrast-enhancement concerning for metastatic disease. - CT pelvis with contrast on 11/27/2021: Expansile, heterogeneous trabeculated appearance of the sacrum with thickening of the cortex,  unchanged from multiple prior exams.  No evidence of pelvic lymphadenopathy.  Extreme prostatomegaly. -PSMA PET scan (03/13/2022): Markedly enlarged prostate gland with focal activity inferior left aspect of the gland suggesting prostatic adenocarcinoma.  No nodal metastasis in the abdomen or pelvis.  Multifocal skeletal metastasis in the mid sacrum, right sacral ala, subtle lesions in the L2 and several rib lesions. - Degarelix started on 04/15/2022   2. Social/family history: - Lives at home with his wife.  Retired in 2003 from [employer].  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  Non-smoker. - Sister had cholangiocarcinoma.  Paternal first cousin had metastatic cancer.  Paternal uncle had prostate cancer.  3.  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  Core biopsy demonstrates hypercellular marrow (95%) with large atypical plasma cells arranged in sheets, by CD138 IHC plasma cells comprise 70% of the cellular elements, kappa restricted.  Amyloid was negative. 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  Chronic progressive changes involving the sacrum, left iliac bone including accentuation of trabecular markings and cortical thickening with low FDG uptake suggestive of chronic Paget's disease.  Marked prostate gland enlargement. - BMBX (03/20/2022):Hypercellular bone marrow with at least 25% plasma cells on aspirate with atypical features.  Core biopsy demonstrates hypercellular marrow for age (95%) with large atypical plasma cells arranged in sheets.  By CD138 IHC, plasma cells comprise approximately 70% of cellular elements and are kappa restricted.  Amyloid deposition was  negative  by Congo red.  Karyotype showed 20 q. deletion.  Myeloma FISH panel showed complex abnormalities. - Dara KRD started on 04/15/2022, 8 cycles completed. - Stem cell collection was completed at [facility].  Patient decided to do transplant at first relapse.    Plan: 1. 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 - He will receive Eligard 45 mg today.  2.  IgG kappa multiple myeloma with complex cytogenetics: - He is tolerating Revlimid reasonably well with minor bowel changes. - He reports itching first few days after he starts his cycle each time. - Reviewed myeloma labs from 09/24/2023: M spike is negative.  Free light chain ratio is 1.84.  Immunofixation shows IgG kappa. - Recommend continuing Revlimid 15 mg 3 weeks on/1 week off.  Would consider decreasing to 10 mg dose once the FLC ratio is normalized. - RTC 12 weeks for follow-up.  3.  Normocytic anemia: - Continue iron tablet daily.  Ferritin improved to 49.  Hemoglobin today is 11.2. - He is having colonoscopy on Monday.  4.  Osteopenia (DEXA scan 06/04/2022: T score -2.1): - Calcium is 8.4 with albumin 3.6.  Continue denosumab today and monthly.    Orders Placed This Encounter  Procedures   PSA    Order Specific Question:   Release to patient    Answer:   Immediate [1]    Order Specific Question:   Remote health to draw?    Answer:   No   Kappa/lambda light chains    Standing Status:   Future    Standing Expiration Date:   09/30/2024   Immunofixation electrophoresis    Standing Status:   Future    Standing Expiration Date:   09/30/2024   Protein electrophoresis, serum    Standing Status:   Future    Standing Expiration Date:   09/30/2024   Iron and TIBC ([facility] ONLY)    Standing Status:   Future    Standing Expiration Date:   09/30/2024   Ferritin    Standing Status:   Future    Standing Expiration Date:   09/30/2024   PSA     Standing Status:   Future    Standing Expiration Date:   09/30/2024      Mikeal Hawthorne R [name],acting as a scribe for Doreatha Massed, MD.,have documented all relevant documentation on the behalf of Doreatha Massed, MD,as directed by  Doreatha Massed, MD while in the presence of Doreatha Massed, MD.  I, Doreatha Massed MD, have reviewed the above documentation for accuracy and completeness, and I agree with the above.     Doreatha Massed, MD   10/10/20245:43 PM  CHIEF COMPLAINT:   Diagnosis: prostate cancer and multiple myeloma    Cancer Staging  Multiple myeloma (HCC) Staging form: Plasma Cell Myeloma and Plasma Cell Disorders, AJCC 8th Edition - Clinical: No stage assigned - Unsigned    Prior Therapy: Carfilzomib (20/70) + Daratumumab SQ + Dexamethasone (20/40) DaraKRd q28d   Current Therapy:  Eligard; Revlimid 15 mg 3 weeks on/1 week of    HISTORY OF PRESENT ILLNESS:   Oncology History  Multiple myeloma (HCC)  04/09/2022 Initial Diagnosis   Multiple myeloma (HCC)   04/15/2022 - 11/11/2022 Chemotherapy   Patient is on Treatment Plan : MYELOMA RELAPSED/REFRACTORY Carfilzomib (20/70) + Daratumumab SQ + Dexamethasone (20/40) DaraKd q28d        INTERVAL HISTORY:   Luis Stewart is a 72 y.o. male presenting to  clinic today for follow up of prostate cancer and multiple myeloma. He was last seen by me on 07/23/23.  Of note, he has a colonoscopy scheduled for 10/05/23 with Dr. Jena Gauss.   Today, he states that he is doing well overall. His appetite level is at 100%. His energy level is at 100%. He is accompanied by his wife.   He reports stable back and leg pain. He denies any infections since his last visit. He denies any side effects from Revlimid and is taking iron supplements as prescribed. He reports on and off loose stools for the last few months.   PAST MEDICAL HISTORY:   Past Medical History: Past Medical History:  Diagnosis Date   Arthritis     Asbestosis (HCC)    Closed fracture of distal end of fibula with tibia with routine healing 05/28/2020   COVID-19 09/03/2021   Cyst of kidney, acquired    Cystic disease of liver    Enlarged prostate    Headache    History of fracture of leg    Right, childhood   Hx of migraine headaches    Hyperlipidemia    Multiple myeloma (HCC)    Neuropathy    PE (pulmonary thromboembolism) (HCC)    After knee surgery   Prostate cancer (HCC) 2015   Adenocarcinoma by biopsy    Surgical History: Past Surgical History:  Procedure Laterality Date   BIOPSY PROSTATE  2003 and 2005   COLONOSCOPY  11/28/2008   Dr. Rourk:internal hemorrhoids/tortus colon/ascending colon polyps, tubular adenomas   COLONOSCOPY N/A 09/04/2014   Procedure: COLONOSCOPY;  Surgeon: Corbin Ade, MD;  [facility];  Service: Endoscopy;  Laterality: N/A;  9:30   COLONOSCOPY N/A 07/17/2017   Procedure: COLONOSCOPY;  Surgeon: West Bali, MD;  [facility]: [facility];  Service: Endoscopy;  Laterality: N/A;   ESOPHAGOGASTRODUODENOSCOPY N/A 07/16/2017   Procedure: ESOPHAGOGASTRODUODENOSCOPY (EGD);  Surgeon: West Bali, MD;  [facility];  Service: Endoscopy;  Laterality: N/A;   GIVENS CAPSULE STUDY  07/16/2017   Procedure: GIVENS CAPSULE STUDY;  Surgeon: West Bali, MD;  [facility];  Service: Endoscopy;;   JOINT REPLACEMENT N/A    Phreesia 01/12/2021   POLYPECTOMY  07/17/2017   Procedure: POLYPECTOMY;  Surgeon: West Bali, MD;  [facility]: [facility];  Service: Endoscopy;;  cecal   PORTACATH PLACEMENT Left 04/18/2022   Procedure: INSERTION PORT-A-CATH;  Surgeon: Franky Macho, MD;  Location: [facility];  Service: General;  Laterality: Left;   TOTAL KNEE ARTHROPLASTY Right 06/10/2017   TOTAL KNEE ARTHROPLASTY Right 06/10/2017   Procedure: TOTAL KNEE ARTHROPLASTY;  Surgeon: Loreta Ave, MD;  Location: [facility] OR;  Service: Orthopedics;  Laterality: Right;    Social  History: Social History   Socioeconomic History   Marital status: Married    Spouse name: Marvia Pickles   Number of children: Not on file   Years of education: Not on file   Highest education level: Not on file  Occupational History   Occupation: Retired  Tobacco Use   Smoking status: Never   Smokeless tobacco: Never  Vaping Use   Vaping status: Never Used  Substance and Sexual Activity   Alcohol use: No   Drug use: No   Sexual activity: Yes  Other Topics Concern   Not on file  Social History Narrative   Previously worked at [employer]   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  Social Determinants of Health   Financial Resource Strain: Low Risk  (06/01/2023)   Overall Financial Resource Strain (CARDIA)    Difficulty of Paying Living Expenses: Not hard at all  Food Insecurity: No Food Insecurity (06/01/2023)   Hunger Vital Sign    Worried About Running Out of Food in the Last Year: Never true    Ran Out of Food in the Last Year: Never true  Transportation Needs: No Transportation Needs (06/01/2023)   PRAPARE - Administrator, Civil Service (Medical): No    Lack of Transportation (Non-Medical): No  Physical Activity: Insufficiently Active (02/09/2023)   Exercise Vital Sign    Days of Exercise per Week: 5 days    Minutes of Exercise per Session: 20 min  Stress: No Stress Concern Present (06/01/2023)   [employer] of Occupational Health - Occupational Stress Questionnaire    Feeling of Stress : Not at all  Social Connections: Socially Integrated (06/01/2023)   Social Connection and Isolation Panel [NHANES]    Frequency of Communication with Friends and Family: More than three times a week    Frequency of Social Gatherings with Friends and Family: More than three times a week    Attends Religious Services: More than 4 times per year    Active Member of [employer] or Organizations: Yes    Attends Engineer, structural: More than 4  times per year    Marital Status: Married  Catering manager Violence: Not At Risk (02/09/2023)   Humiliation, Afraid, Rape, and Kick questionnaire    Fear of Current or Ex-Partner: No    Emotionally Abused: No    Physically Abused: No    Sexually Abused: No    Family History: Family History  Problem Relation Age of Onset   Diabetes Mother    Hypertension Mother    Obesity Mother    Heart disease Mother    Mental illness Mother    Hypertension Sister    Obesity Sister    Arthritis Sister    Deep vein thrombosis Father    Dementia Father    Heart disease Father        CABG   Colon cancer Neg Hx     Current Medications:  Current Outpatient Medications:    acetaminophen (TYLENOL) 500 MG tablet, Take 1,000 mg by mouth every 6 (six) hours as needed for mild pain or moderate pain., Disp: , Rfl:    acyclovir (ZOVIRAX) 400 MG tablet, TAKE 1 TABLET BY MOUTH TWICE A DAY, Disp: 180 tablet, Rfl: 2   Cholecalciferol (VITAMIN D) 2000 units CAPS, Take 2,000 Units by mouth every other day., Disp: , Rfl:    DULoxetine (CYMBALTA) 30 MG capsule, TAKE 1 CAPSULE BY MOUTH EVERY DAY, Disp: 90 capsule, Rfl: 2   ferrous sulfate 325 (65 FE) MG EC tablet, Take 325 mg by mouth daily., Disp: , Rfl:    lenalidomide (REVLIMID) 15 MG capsule, Take 1 capsule (15 mg total) by mouth daily. Take for 21 days, then hold for 7 days. Repeat every 28 days., Disp: 21 capsule, Rfl: 0   Multiple Vitamin (MULTIVITAMIN WITH MINERALS) TABS tablet, Take 1 tablet by mouth daily with breakfast. Centrum Silver for Men, Disp: , Rfl:    nitroGLYCERIN (NITROSTAT) 0.4 MG SL tablet, Place 1 tablet (0.4 mg total) under the tongue every 5 (five) minutes x 3 doses as needed for chest pain (If no relief after 3rd dose, call or go to ED.)., Disp: 30 tablet, Rfl: 0  pantoprazole (PROTONIX) 40 MG tablet, TAKE 1 TABLET BY MOUTH EVERY OTHER DAY, Disp: 45 tablet, Rfl: 1   tamsulosin (FLOMAX) 0.4 MG CAPS capsule, Take 0.4 mg by mouth every  other day., Disp: , Rfl:    rosuvastatin (CRESTOR) 5 MG tablet, Take 1 tablet (5 mg total) by mouth daily., Disp: 90 tablet, Rfl: 3   Allergies: Allergies  Allergen Reactions   Sulfa Antibiotics Other (See Comments)    Unknown reaction. Childhood reaction    REVIEW OF SYSTEMS:   Review of Systems  Constitutional:  Negative for chills, fatigue and fever.  HENT:   Negative for lump/mass, mouth sores, nosebleeds, sore throat and trouble swallowing.   Eyes:  Negative for eye problems.  Respiratory:  Negative for cough and shortness of breath.   Cardiovascular:  Negative for chest pain, leg swelling and palpitations.  Gastrointestinal:  Negative for abdominal pain, constipation, diarrhea, nausea and vomiting.  Genitourinary:  Negative for bladder incontinence, difficulty urinating, dysuria, frequency, hematuria and nocturia.   Musculoskeletal:  Positive for back pain (7/10 severity). Negative for arthralgias, flank pain, myalgias and neck pain.       +leg pain, 7/10 severity  Skin:  Negative for itching and rash.  Neurological:  Negative for dizziness, headaches and numbness.  Hematological:  Does not bruise/bleed easily.  Psychiatric/Behavioral:  Negative for depression, sleep disturbance and suicidal ideas. The patient is not nervous/anxious.   All other systems reviewed and are negative.    VITALS:   Blood pressure 129/81, pulse (!) 59, temperature 97.9 F (36.6 C), temperature source Oral, resp. rate 17, height 6\' 2"  (1.88 m), weight 200 lb 12.8 oz (91.1 kg), SpO2 100%.  Wt Readings from Last 3 Encounters:  10/01/23 200 lb 12.8 oz (91.1 kg)  08/19/23 203 lb 3.2 oz (92.2 kg)  08/11/23 203 lb 3.2 oz (92.2 kg)    Body mass index is 25.78 kg/m.  Performance status (ECOG): 1 - Symptomatic but completely ambulatory  PHYSICAL EXAM:   Physical Exam Vitals and nursing note reviewed. Exam conducted with a chaperone present.  Constitutional:      Appearance: Normal appearance.   Cardiovascular:     Rate and Rhythm: Normal rate and regular rhythm.     Pulses: Normal pulses.     Heart sounds: Normal heart sounds.  Pulmonary:     Effort: Pulmonary effort is normal.     Breath sounds: Normal breath sounds.  Abdominal:     Palpations: Abdomen is soft. There is no hepatomegaly, splenomegaly or mass.     Tenderness: There is no abdominal tenderness.  Musculoskeletal:     Right lower leg: No edema.     Left lower leg: No edema.  Lymphadenopathy:     Cervical: No cervical adenopathy.     Right cervical: No superficial, deep or posterior cervical adenopathy.    Left cervical: No superficial, deep or posterior cervical adenopathy.     Upper Body:     Right upper body: No supraclavicular or axillary adenopathy.     Left upper body: No supraclavicular or axillary adenopathy.  Neurological:     General: No focal deficit present.     Mental Status: He is alert and oriented to person, place, and time.  Psychiatric:        Mood and Affect: Mood normal.        Behavior: Behavior normal.     LABS:      Latest Ref Rng & Units 09/24/2023   10:06 AM 07/16/2023  9:57 AM 05/28/2023   11:12 AM  CBC  WBC 4.0 - 10.5 K/uL 3.4  3.1  3.4   Hemoglobin 13.0 - 17.0 g/dL 29.5  28.4  13.2   Hematocrit 39.0 - 52.0 % 33.5  32.9  33.1   Platelets 150 - 400 K/uL 120  198  188       Latest Ref Rng & Units 09/24/2023   10:06 AM 08/20/2023   10:30 AM 07/16/2023    9:57 AM  CMP  Glucose 70 - 99 mg/dL 440  92  102   BUN 8 - 23 mg/dL 19  13  26    Creatinine 0.61 - 1.24 mg/dL 7.25  3.66  4.40   Sodium 135 - 145 mmol/L 138  140  138   Potassium 3.5 - 5.1 mmol/L 3.4  3.6  3.6   Chloride 98 - 111 mmol/L 104  108  108   CO2 22 - 32 mmol/L 25  28  23    Calcium 8.9 - 10.3 mg/dL 8.4  8.4  8.3   Total Protein 6.5 - 8.1 g/dL 6.0  5.5  5.8   Total Bilirubin 0.3 - 1.2 mg/dL 0.4  0.5  0.9   Alkaline Phos 38 - 126 U/L 32  31  35   AST 15 - 41 U/L 33  17  23   ALT 0 - 44 U/L 27  22  25        No results found for: "CEA1", "CEA" / No results found for: "CEA1", "CEA" Lab Results  Component Value Date   PSA1 22.5 (H) 11/05/2021   No results found for: "HKV425" No results found for: "CAN125"  Lab Results  Component Value Date   TOTALPROTELP 5.2 (L) 09/24/2023   TOTALPROTELP 5.3 (L) 09/24/2023   ALBUMINELP 3.4 09/24/2023   A1GS 0.2 09/24/2023   A2GS 0.5 09/24/2023   BETS 0.8 09/24/2023   GAMS 0.3 (L) 09/24/2023   MSPIKE 0.1 (H) 09/24/2023   SPEI Comment 09/24/2023   Lab Results  Component Value Date   TIBC 392 09/24/2023   TIBC 441 07/16/2023   TIBC 301 02/26/2022   FERRITIN 49 09/24/2023   FERRITIN 11 (L) 07/16/2023   FERRITIN 168 02/26/2022   IRONPCTSAT 15 (L) 09/24/2023   IRONPCTSAT 11 (L) 07/16/2023   IRONPCTSAT 42 (H) 02/26/2022   Lab Results  Component Value Date   LDH 142 10/21/2022   LDH 131 08/26/2022   LDH 124 07/29/2022     STUDIES:   No results found.

## 2023-10-01 NOTE — Patient Instructions (Addendum)
[facility] at [facility] Discharge Instructions   You were seen and examined today by Dr. Ellin Saba.  He reviewed the results of your lab work which are normal/stable.   Continue Revlimid as prescribed.   We will proceed with your Xgeva injection today.   We will see you back in 3 months. We will repeat lab work prior to this visit.   Return as scheduled.    Thank you for choosing [facility] at [facility] to provide your oncology and hematology care.  To afford each patient quality time with our provider, please arrive at least 15 minutes before your scheduled appointment time.   If you have a lab appointment with the Cancer Center please come in thru the Main Entrance and check in at the main information desk.  You need to re-schedule your appointment should you arrive 10 or more minutes late.  We strive to give you quality time with our providers, and arriving late affects you and other patients whose appointments are after yours.  Also, if you no show three or more times for appointments you may be dismissed from the clinic at the providers discretion.     Again, thank you for choosing [facility].  Our hope is that these requests will decrease the amount of time that you wait before being seen by our physicians.       _____________________________________________________________  Should you have questions after your visit to [facility], please contact our office at [phone] and follow the prompts.  Our office hours are 8:00 a.m. and 4:30 p.m. Monday - Friday.  Please note that voicemails left after 4:00 p.m. may not be returned until the following business day.  We are closed weekends and major holidays.  You do have access to a nurse 24-7, just call the main number to the clinic [phone] and do not press any options, hold on the line and a nurse will answer the phone.    For prescription refill  requests, have your pharmacy contact our office and allow 72 hours.    Due to Covid, you will need to wear a mask upon entering the hospital. If you do not have a mask, a mask will be given to you at the Main Entrance upon arrival. For doctor visits, patients may have 1 support person age 41 or older with them. For treatment visits, patients can not have anyone with them due to social distancing guidelines and our immunocompromised population.

## 2023-10-01 NOTE — Progress Notes (Signed)
Ok to proceed with Xgeva with corrected calcium 8.7 per MD.  Richardean Sale, RPH, BCPS, BCOP 10/01/2023 12:06 PM

## 2023-10-02 ENCOUNTER — Encounter (HOSPITAL_COMMUNITY): Payer: Self-pay

## 2023-10-02 ENCOUNTER — Encounter (HOSPITAL_COMMUNITY)
Admission: RE | Admit: 2023-10-02 | Discharge: 2023-10-02 | Disposition: A | Discharge status: Home / Self Care | Payer: Medicare HMO | Source: Ambulatory Visit | Attending: Internal Medicine | Admitting: Internal Medicine

## 2023-10-05 ENCOUNTER — Encounter (HOSPITAL_COMMUNITY): Payer: Self-pay | Admitting: Internal Medicine

## 2023-10-05 ENCOUNTER — Encounter (HOSPITAL_COMMUNITY)
Admission: RE | Disposition: A | Discharge status: Home / Self Care | Payer: Self-pay | Source: Home / Self Care | Attending: Internal Medicine

## 2023-10-05 ENCOUNTER — Ambulatory Visit (HOSPITAL_COMMUNITY)
Admission: RE | Admit: 2023-10-05 | Discharge: 2023-10-05 | Disposition: A | Discharge status: Home / Self Care | Payer: Medicare HMO | Attending: Internal Medicine | Admitting: Internal Medicine

## 2023-10-05 ENCOUNTER — Ambulatory Visit (HOSPITAL_BASED_OUTPATIENT_CLINIC_OR_DEPARTMENT_OTHER): Payer: Medicare HMO | Admitting: Certified Registered"

## 2023-10-05 ENCOUNTER — Ambulatory Visit (HOSPITAL_COMMUNITY): Payer: Medicare HMO | Admitting: Certified Registered"

## 2023-10-05 DIAGNOSIS — K219 Gastro-esophageal reflux disease without esophagitis: Secondary | ICD-10-CM | POA: Diagnosis not present

## 2023-10-05 DIAGNOSIS — K635 Polyp of colon: Secondary | ICD-10-CM | POA: Diagnosis not present

## 2023-10-05 DIAGNOSIS — Z1211 Encounter for screening for malignant neoplasm of colon: Secondary | ICD-10-CM | POA: Diagnosis not present

## 2023-10-05 DIAGNOSIS — K649 Unspecified hemorrhoids: Secondary | ICD-10-CM | POA: Diagnosis not present

## 2023-10-05 DIAGNOSIS — Z860100 Personal history of colon polyps, unspecified: Secondary | ICD-10-CM | POA: Diagnosis not present

## 2023-10-05 DIAGNOSIS — R519 Headache, unspecified: Secondary | ICD-10-CM | POA: Insufficient documentation

## 2023-10-05 DIAGNOSIS — D123 Benign neoplasm of transverse colon: Secondary | ICD-10-CM | POA: Insufficient documentation

## 2023-10-05 DIAGNOSIS — Z833 Family history of diabetes mellitus: Secondary | ICD-10-CM | POA: Diagnosis not present

## 2023-10-05 DIAGNOSIS — Z860101 Personal history of adenomatous and serrated colon polyps: Secondary | ICD-10-CM | POA: Insufficient documentation

## 2023-10-05 DIAGNOSIS — Z79624 Long term (current) use of inhibitors of nucleotide synthesis: Secondary | ICD-10-CM | POA: Insufficient documentation

## 2023-10-05 DIAGNOSIS — K640 First degree hemorrhoids: Secondary | ICD-10-CM | POA: Insufficient documentation

## 2023-10-05 DIAGNOSIS — C9000 Multiple myeloma not having achieved remission: Secondary | ICD-10-CM | POA: Diagnosis not present

## 2023-10-05 DIAGNOSIS — N289 Disorder of kidney and ureter, unspecified: Secondary | ICD-10-CM | POA: Diagnosis not present

## 2023-10-05 DIAGNOSIS — Z86711 Personal history of pulmonary embolism: Secondary | ICD-10-CM | POA: Insufficient documentation

## 2023-10-05 DIAGNOSIS — Z8546 Personal history of malignant neoplasm of prostate: Secondary | ICD-10-CM | POA: Insufficient documentation

## 2023-10-05 HISTORY — PX: POLYPECTOMY: SHX5525

## 2023-10-05 HISTORY — PX: COLONOSCOPY WITH PROPOFOL: SHX5780

## 2023-10-05 SURGERY — COLONOSCOPY WITH PROPOFOL
Anesthesia: General

## 2023-10-05 MED ORDER — PROPOFOL 10 MG/ML IV BOLUS
INTRAVENOUS | Status: DC | PRN
  Administered 2023-10-05: 100 mg via INTRAVENOUS

## 2023-10-05 MED ORDER — LIDOCAINE HCL (CARDIAC) PF 100 MG/5ML IV SOSY
PREFILLED_SYRINGE | INTRAVENOUS | Status: DC | PRN
  Administered 2023-10-05: 50 mg via INTRAVENOUS

## 2023-10-05 MED ORDER — STERILE WATER FOR IRRIGATION IR SOLN
Status: DC | PRN
  Administered 2023-10-05: 60 mL

## 2023-10-05 MED ORDER — PROPOFOL 500 MG/50ML IV EMUL
INTRAVENOUS | Status: DC | PRN
  Administered 2023-10-05: 150 ug/kg/min via INTRAVENOUS

## 2023-10-05 MED ORDER — PHENYLEPHRINE 80 MCG/ML (10ML) SYRINGE FOR IV PUSH (FOR BLOOD PRESSURE SUPPORT)
PREFILLED_SYRINGE | INTRAVENOUS | Status: DC | PRN
Start: 2023-10-05 — End: 2023-10-05
  Administered 2023-10-05: 160 ug via INTRAVENOUS
  Administered 2023-10-05: 240 ug via INTRAVENOUS
  Administered 2023-10-05 (×2): 160 ug via INTRAVENOUS

## 2023-10-05 MED ORDER — PHENYLEPHRINE 80 MCG/ML (10ML) SYRINGE FOR IV PUSH (FOR BLOOD PRESSURE SUPPORT)
PREFILLED_SYRINGE | INTRAVENOUS | Status: AC
  Filled 2023-10-05: qty 10

## 2023-10-05 MED ORDER — LACTATED RINGERS IV SOLN
INTRAVENOUS | Status: DC | PRN
Start: 2023-10-05 — End: 2023-10-05

## 2023-10-05 NOTE — Op Note (Signed)
[facility] Patient Name: Luis Stewart Procedure Date: 10/05/2023 7:48 AM MRN: [id number] Date of Birth: 1951/10/30 Attending MD: Gennette Pac , MD, [id number] CSN: [id number] Age: 72 Admit Type: Outpatient Procedure:                Colonoscopy Indications:              High risk colon cancer surveillance: Personal                            history of colonic polyps Providers:                Gennette Pac, MD, Angelica Ran, Francoise Ceo                            RN, RN, Elinor Parkinson Referring MD:              Medicines:                Propofol per Anesthesia Complications:            No immediate complications. Estimated Blood Loss:     Estimated blood loss was minimal. Procedure:                Pre-Anesthesia Assessment:                           - Prior to the procedure, a History and Physical                            was performed, and patient medications and                            allergies were reviewed. The patient's tolerance of                            previous anesthesia was also reviewed. The risks                            and benefits of the procedure and the sedation                            options and risks were discussed with the patient.                            All questions were answered, and informed consent                            was obtained. Prior Anticoagulants: The patient has                            taken no anticoagulant or antiplatelet agents. ASA                            Grade Assessment: III - A patient with severe  systemic disease. After reviewing the risks and                            benefits, the patient was deemed in satisfactory                            condition to undergo the procedure.                           After obtaining informed consent, the colonoscope                            was passed under direct vision. Throughout the                             procedure, the patient's blood pressure, pulse, and                            oxygen saturations were monitored continuously. The                            [id number]) scope was introduced through                            the anus and advanced to the the cecum, identified                            by appendiceal orifice and ileocecal valve. The                            colonoscopy was performed without difficulty. The                            patient tolerated the procedure well. The quality                            of the bowel preparation was adequate. The                            ileocecal valve, appendiceal orifice, and rectum                            were photographed. The entire colon was well                            visualized. Scope In: 8:44:36 AM Scope Out: 8:59:26 AM Scope Withdrawal Time: 0 hours 6 minutes 56 seconds  Total Procedure Duration: 0 hours 14 minutes 50 seconds  Findings:      The perianal and digital rectal examinations were normal.      Non-bleeding internal hemorrhoids were found during retroflexion. The       hemorrhoids were moderate, medium-sized and Grade I (internal       hemorrhoids that do not prolapse).      A 5 mm polyp was found in the hepatic flexure.  The polyp was       semi-pedunculated. The polyp was removed with a cold snare. Resection       and retrieval were complete. Estimated blood loss was minimal.      The exam was otherwise without abnormality on direct and retroflexion       views. Impression:               - Non-bleeding internal hemorrhoids.                           - One 5 mm polyp at the hepatic flexure, removed                            with a cold snare. Resected and retrieved.                           - The examination was otherwise normal on direct                            and retroflexion views. Moderate Sedation:      Moderate (conscious) sedation was personally administered by an        anesthesia professional. The following parameters were monitored: oxygen       saturation, heart rate, blood pressure, respiratory rate, EKG, adequacy       of pulmonary ventilation, and response to care. Recommendation:           - Patient has a contact number available for                            emergencies. The signs and symptoms of potential                            delayed complications were discussed with the                            patient. Return to normal activities tomorrow.                            Written discharge instructions were provided to the                            patient.                           - Advance diet as tolerated.                           - Continue present medications.                           - Repeat colonoscopy date to be determined after                            pending pathology results are reviewed for  surveillance based on pathology results.                           - Return to GI office (date not yet determined). Procedure Code(s):        --- Professional ---                           [phone], Colonoscopy, flexible; with removal of                            tumor(s), polyp(s), or other lesion(s) by snare                            technique Diagnosis Code(s):        --- Professional ---                           Z86.010, Personal history of colonic polyps                           D12.3, Benign neoplasm of transverse colon (hepatic                            flexure or splenic flexure)                           K64.0, First degree hemorrhoids CPT copyright 2022 American Medical Association. All rights reserved. The codes documented in this report are preliminary and upon coder review may  be revised to meet current compliance requirements. Gerrit Friends. Ilo Beamon, MD Gennette Pac, MD 10/05/2023 9:07:02 AM This report has been signed electronically. Number of Addenda: 0

## 2023-10-05 NOTE — Transfer of Care (Signed)
Immediate Anesthesia Transfer of Care Note  Patient: Luis Stewart  Procedure(s) Performed: COLONOSCOPY WITH PROPOFOL POLYPECTOMY  Patient Location: Short Stay  Anesthesia Type:General  Level of Consciousness: drowsy  Airway & Oxygen Therapy: Patient Spontanous Breathing and Patient connected to nasal cannula oxygen  Post-op Assessment: Report given to RN and Post -op Vital signs reviewed and stable  Post vital signs: Reviewed and stable  Last Vitals:  Vitals Value Taken Time  BP    Temp    Pulse    Resp    SpO2      Last Pain:  Vitals:   10/05/23 0902  TempSrc: Axillary  PainSc:       Patients Stated Pain Goal: 5 (10/05/23 0821)  Complications: No notable events documented.

## 2023-10-05 NOTE — H&P (Signed)
@LOGO @   Primary Care Physician:  [name], MD Primary Gastroenterologist:  Dr. Jena Gauss  Pre-Procedure History & Physical: HPI:  Luis Stewart is a 72 y.o. male here for   Surveillance colonoscopy.  Distant history colonic adenoma; negative colonoscopy 2018.  Eating constipation diarrhea.  No rectal bleeding.  Past Medical History:  Diagnosis Date   Arthritis    Asbestosis (HCC)    Closed fracture of distal end of fibula with tibia with routine healing 05/28/2020   COVID-19 09/03/2021   Cyst of kidney, acquired    Cystic disease of liver    Enlarged prostate    Headache    History of fracture of leg    Right, childhood   Hx of migraine headaches    Hyperlipidemia    Multiple myeloma (HCC)    Neuropathy    PE (pulmonary thromboembolism) (HCC)    After knee surgery   Prostate cancer (HCC) 2015   Adenocarcinoma by biopsy    Past Surgical History:  Procedure Laterality Date   BIOPSY PROSTATE  2003 and 2005   COLONOSCOPY  11/28/2008   Dr. Trivia Heffelfinger:internal hemorrhoids/tortus colon/ascending colon polyps, tubular adenomas   COLONOSCOPY N/A 09/04/2014   Procedure: COLONOSCOPY;  Surgeon: Corbin Ade, MD;  [facility];  Service: Endoscopy;  Laterality: N/A;  9:30   COLONOSCOPY N/A 07/17/2017   Procedure: COLONOSCOPY;  Surgeon: West Bali, MD;  Location: [facility];  Service: Endoscopy;  Laterality: N/A;   ESOPHAGOGASTRODUODENOSCOPY N/A 07/16/2017   Procedure: ESOPHAGOGASTRODUODENOSCOPY (EGD);  Surgeon: West Bali, MD;  Location: [facility];  Service: Endoscopy;  Laterality: N/A;   GIVENS CAPSULE STUDY  07/16/2017   Procedure: GIVENS CAPSULE STUDY;  Surgeon: West Bali, MD;  Location: [facility];  Service: Endoscopy;;   JOINT REPLACEMENT N/A    Phreesia 01/12/2021   POLYPECTOMY  07/17/2017   Procedure: POLYPECTOMY;  Surgeon: West Bali, MD;  Location: [facility];  Service: Endoscopy;;  cecal   PORTACATH PLACEMENT Left  04/18/2022   Procedure: INSERTION PORT-A-CATH;  Surgeon: Franky Macho, MD;  Location: [facility];  Service: General;  Laterality: Left;   TOTAL KNEE ARTHROPLASTY Right 06/10/2017   TOTAL KNEE ARTHROPLASTY Right 06/10/2017   Procedure: TOTAL KNEE ARTHROPLASTY;  Surgeon: Loreta Ave, MD;  Location: [facility] OR;  Service: Orthopedics;  Laterality: Right;    Prior to Admission medications   Medication Sig Start Date End Date Taking? Authorizing Provider  acetaminophen (TYLENOL) 500 MG tablet Take 1,000 mg by mouth every 6 (six) hours as needed for mild pain or moderate pain.   Yes [provider]  acyclovir (ZOVIRAX) 400 MG tablet TAKE 1 TABLET BY MOUTH TWICE A DAY 07/03/23  Yes Doreatha Massed, MD  Cholecalciferol (VITAMIN D) 2000 units CAPS Take 2,000 Units by mouth every other day.   Yes [provider]  DULoxetine (CYMBALTA) 30 MG capsule TAKE 1 CAPSULE BY MOUTH EVERY DAY 09/11/23  Yes [name], MD  ferrous sulfate 325 (65 FE) MG EC tablet Take 325 mg by mouth daily.   Yes [provider]  Multiple Vitamin (MULTIVITAMIN WITH MINERALS) TABS tablet Take 1 tablet by mouth daily with breakfast. Centrum Silver for Men   Yes [provider]  pantoprazole (PROTONIX) 40 MG tablet TAKE 1 TABLET BY MOUTH EVERY OTHER DAY 07/22/23  Yes [name], MD  tamsulosin (FLOMAX) 0.4 MG CAPS capsule Take 0.4 mg by mouth every other day.   Yes  [provider]  lenalidomide (REVLIMID) 15 MG capsule Take 1 capsule (15 mg total) by mouth daily. Take for 21 days, then hold for 7 days. Repeat every 28 days. 09/28/23   Doreatha Massed, MD  nitroGLYCERIN (NITROSTAT) 0.4 MG SL tablet Place 1 tablet (0.4 mg total) under the tongue every 5 (five) minutes x 3 doses as needed for chest pain (If no relief after 3rd dose, call or go to ED.). 11/20/22   [name], NP  rosuvastatin (CRESTOR) 5 MG tablet Take 1 tablet (5 mg total) by mouth daily. 06/09/23 09/07/23  Jonelle Sidle, MD    Allergies as of 09/07/2023 - Review Complete 08/20/2023  Allergen Reaction Noted   Sulfa antibiotics Other (See Comments) 05/26/2017    Family History  Problem Relation Age of Onset   Diabetes Mother    Hypertension Mother    Obesity Mother    Heart disease Mother    Mental illness Mother    Hypertension Sister    Obesity Sister    Arthritis Sister    Deep vein thrombosis Father    Dementia Father    Heart disease Father        CABG   Colon cancer Neg Hx     Social History   Socioeconomic History   Marital status: Married    Spouse name: Marvia Pickles   Number of children: Not on file   Years of education: Not on file   Highest education level: Not on file  Occupational History   Occupation: Retired  Tobacco Use   Smoking status: Never   Smokeless tobacco: Never  Vaping Use   Vaping status: Never Used  Substance and Sexual Activity   Alcohol use: No   Drug use: No   Sexual activity: Yes  Other Topics Concern   Not on file  Social History Narrative   Previously worked at [employer]   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   Social Determinants of Health   Financial Resource Strain: Low Risk  (06/01/2023)   Overall Financial Resource Strain (CARDIA)    Difficulty of Paying Living Expenses: Not hard at all  Food Insecurity: No Food Insecurity (06/01/2023)   Hunger Vital Sign    Worried About Running Out of Food in the Last Year: Never true    Ran Out of Food in the Last Year: Never true  Transportation Needs: No Transportation Needs (06/01/2023)   PRAPARE - Administrator, Civil Service (Medical): No    Lack of Transportation (Non-Medical): No  Physical Activity: Insufficiently Active (02/09/2023)   Exercise Vital Sign    Days of Exercise per Week: 5 days    Minutes of Exercise per Session: 20 min  Stress: No Stress Concern Present (06/01/2023)   [employer] of Occupational Health - Occupational  Stress Questionnaire    Feeling of Stress : Not at all  Social Connections: Socially Integrated (06/01/2023)   Social Connection and Isolation Panel [NHANES]    Frequency of Communication with Friends and Family: More than three times a week    Frequency of Social Gatherings with Friends and Family: More than three times a week    Attends Religious Services: More than 4 times per year    Active Member of [employer] or Organizations: Yes    Attends Banker Meetings: More than 4 times per year    Marital Status: Married  Catering manager Violence: Not At Risk (02/09/2023)  Humiliation, Afraid, Rape, and Kick questionnaire    Fear of Current or Ex-Partner: No    Emotionally Abused: No    Physically Abused: No    Sexually Abused: No    Review of Systems: See HPI, otherwise negative ROS  Physical Exam: BP (!) 143/74   Pulse 61   Temp 98.7 F (37.1 C) (Oral)   Resp 18   SpO2 100%  General:   Alert,  Well-developed, well-nourished, pleasant and cooperative in NAD Neck:  Supple; no masses or thyromegaly. No significant cervical adenopathy. Lungs:  Clear throughout to auscultation.   No wheezes, crackles, or rhonchi. No acute distress. Heart:  Regular rate and rhythm; no murmurs, clicks, rubs,  or gallops. Abdomen: Non-distended, normal bowel sounds.  Soft and nontender without appreciable mass or hepatosplenomegaly.   Impression/Plan:    72 year old gentleman history colonic adenoma.  Here for surveillance colonoscopy. The risks, benefits, limitations, alternatives and imponderables have been reviewed with the patient. Questions have been answered. All parties are agreeable.      Notice: This dictation was prepared with Dragon dictation along with smaller phrase technology. Any transcriptional errors that result from this process are unintentional and may not be corrected upon review.

## 2023-10-05 NOTE — Discharge Instructions (Addendum)
  Colonoscopy Discharge Instructions  Read the instructions outlined below and refer to this sheet in the next few weeks. These discharge instructions provide you with general information on caring for yourself after you leave the hospital. Your doctor may also give you specific instructions. While your treatment has been planned according to the most current medical practices available, unavoidable complications occasionally occur. If you have any problems or questions after discharge, call Dr. Jena Gauss at [phone]. ACTIVITY You may resume your regular activity, but move at a slower pace for the next 24 hours.  Take frequent rest periods for the next 24 hours.  Walking will help get rid of the air and reduce the bloated feeling in your belly (abdomen).  No driving for 24 hours (because of the medicine (anesthesia) used during the test).   Do not sign any important legal documents or operate any machinery for 24 hours (because of the anesthesia used during the test).  NUTRITION Drink plenty of fluids.  You may resume your normal diet as instructed by your doctor.  Begin with a light meal and progress to your normal diet. Heavy or fried foods are harder to digest and may make you feel sick to your stomach (nauseated).  Avoid alcoholic beverages for 24 hours or as instructed.  MEDICATIONS You may resume your normal medications unless your doctor tells you otherwise.  WHAT YOU CAN EXPECT TODAY Some feelings of bloating in the abdomen.  Passage of more gas than usual.  Spotting of blood in your stool or on the toilet paper.  IF YOU HAD POLYPS REMOVED DURING THE COLONOSCOPY: No aspirin products for 7 days or as instructed.  No alcohol for 7 days or as instructed.  Eat a soft diet for the next 24 hours.  FINDING OUT THE RESULTS OF YOUR TEST Not all test results are available during your visit. If your test results are not back during the visit, make an appointment with your caregiver to find out the  results. Do not assume everything is normal if you have not heard from your caregiver or the medical facility. It is important for you to follow up on all of your test results.  SEEK IMMEDIATE MEDICAL ATTENTION IF: You have more than a spotting of blood in your stool.  Your belly is swollen (abdominal distention).  You are nauseated or vomiting.  You have a temperature over 101.  You have abdominal pain or discomfort that is severe or gets worse throughout the day.      1 polyp removed from your colon      Further recommendations to follow pending review of pathology report     at patient request I called Marvia Pickles at [phone] -  reviewed findings and recommendations

## 2023-10-05 NOTE — Anesthesia Preprocedure Evaluation (Signed)
Anesthesia Evaluation  Patient identified by MRN, date of birth, ID band Patient awake    Reviewed: Allergy & Precautions, H&P , NPO status , Patient's Chart, lab work & pertinent test results, reviewed documented beta blocker date and time   Airway Mallampati: II  TM Distance: >3 FB Neck ROM: full    Dental no notable dental hx.    Pulmonary neg pulmonary ROS   Pulmonary exam normal breath sounds clear to auscultation       Cardiovascular Exercise Tolerance: Good negative cardio ROS  Rhythm:regular Rate:Normal     Neuro/Psych  Headaches  Neuromuscular disease negative neurological ROS  negative psych ROS   GI/Hepatic negative GI ROS, Neg liver ROS,GERD  ,,  Endo/Other  negative endocrine ROS    Renal/GU Renal diseasenegative Renal ROS  negative genitourinary   Musculoskeletal   Abdominal   Peds  Hematology negative hematology ROS (+)   Anesthesia Other Findings   Reproductive/Obstetrics negative OB ROS                             Anesthesia Physical Anesthesia Plan  ASA: 2  Anesthesia Plan: General   Post-op Pain Management:    Induction:   PONV Risk Score and Plan: Propofol infusion  Airway Management Planned:   Additional Equipment:   Intra-op Plan:   Post-operative Plan:   Informed Consent: I have reviewed the patients History and Physical, chart, labs and discussed the procedure including the risks, benefits and alternatives for the proposed anesthesia with the patient or authorized representative who has indicated his/her understanding and acceptance.     Dental Advisory Given  Plan Discussed with: CRNA  Anesthesia Plan Comments:        Anesthesia Quick Evaluation

## 2023-10-05 NOTE — Anesthesia Procedure Notes (Signed)
Date/Time: 10/05/2023 8:42 AM  Performed by: Julian Reil, CRNAPre-anesthesia Checklist: Patient identified, Emergency Drugs available, Suction available and Patient being monitored Patient Re-evaluated:Patient Re-evaluated prior to induction Oxygen Delivery Method: Nasal cannula Induction Type: IV induction Placement Confirmation: positive ETCO2

## 2023-10-06 ENCOUNTER — Encounter: Payer: Self-pay | Admitting: Internal Medicine

## 2023-10-06 LAB — SURGICAL PATHOLOGY

## 2023-10-09 NOTE — Anesthesia Postprocedure Evaluation (Signed)
Anesthesia Post Note  Patient: Luis Stewart  Procedure(s) Performed: COLONOSCOPY WITH PROPOFOL POLYPECTOMY  Patient location during evaluation: Phase II Anesthesia Type: General Level of consciousness: awake Pain management: pain level controlled Vital Signs Assessment: post-procedure vital signs reviewed and stable Respiratory status: spontaneous breathing and respiratory function stable Cardiovascular status: blood pressure returned to baseline and stable Postop Assessment: no headache and no apparent nausea or vomiting Anesthetic complications: no Comments: Late entry   No notable events documented.   Last Vitals:  Vitals:   10/05/23 0913 10/05/23 0915  BP: (!) 101/59 100/61  Pulse:    Resp:    Temp:    SpO2:      Last Pain:  Vitals:   10/05/23 0907  TempSrc:   PainSc: 0-No pain                 Windell Norfolk

## 2023-10-12 DIAGNOSIS — H04123 Dry eye syndrome of bilateral lacrimal glands: Secondary | ICD-10-CM | POA: Diagnosis not present

## 2023-10-13 ENCOUNTER — Encounter (HOSPITAL_COMMUNITY): Payer: Self-pay | Admitting: Internal Medicine

## 2023-10-21 ENCOUNTER — Telehealth: Payer: Self-pay | Admitting: *Deleted

## 2023-10-21 NOTE — Progress Notes (Signed)
Care Coordination Note  10/21/2023 Name: Luis Stewart MRN: [id number] DOB: Jan 23, 1951  Luis Stewart is a 72 y.o. year old male who is a primary care patient of [name], MD and is actively engaged with the care management team. I reached out to [name] by phone today to assist with re-scheduling a follow up visit with the RN Case Manager  Follow up plan: Successful telephone outreach attempt made patient request a return call to reschedule call with RNCM.  [facility]  Care Coordination Care Guide  Direct Dial: [phone]

## 2023-10-22 ENCOUNTER — Other Ambulatory Visit: Payer: Self-pay

## 2023-10-22 DIAGNOSIS — C9000 Multiple myeloma not having achieved remission: Secondary | ICD-10-CM

## 2023-10-22 MED ORDER — LENALIDOMIDE 15 MG PO CAPS: 15.0000 mg | ORAL_CAPSULE | Freq: Every day | ORAL | 0 refills | Status: DC

## 2023-10-22 NOTE — Telephone Encounter (Signed)
Chart reviewed. Revlimid refilled per last office note with Dr. Katragadda.  

## 2023-10-26 ENCOUNTER — Encounter: Payer: Medicare HMO | Admitting: *Deleted

## 2023-10-29 ENCOUNTER — Inpatient Hospital Stay: Payer: Medicare HMO

## 2023-10-29 ENCOUNTER — Inpatient Hospital Stay: Payer: Medicare HMO | Attending: Hematology

## 2023-10-29 DIAGNOSIS — C61 Malignant neoplasm of prostate: Secondary | ICD-10-CM

## 2023-10-29 DIAGNOSIS — C7951 Secondary malignant neoplasm of bone: Secondary | ICD-10-CM | POA: Insufficient documentation

## 2023-10-29 DIAGNOSIS — C9000 Multiple myeloma not having achieved remission: Secondary | ICD-10-CM | POA: Diagnosis not present

## 2023-10-29 LAB — COMPREHENSIVE METABOLIC PANEL
ALT: 28 U/L (ref 0–44)
AST: 25 U/L (ref 15–41)
Albumin: 3.4 g/dL — ABNORMAL LOW (ref 3.5–5.0)
Alkaline Phosphatase: 35 U/L — ABNORMAL LOW (ref 38–126)
Anion gap: 6 (ref 5–15)
BUN: 19 mg/dL (ref 8–23)
CO2: 27 mmol/L (ref 22–32)
Calcium: 8.6 mg/dL — ABNORMAL LOW (ref 8.9–10.3)
Chloride: 107 mmol/L (ref 98–111)
Creatinine, Ser: 1.13 mg/dL (ref 0.61–1.24)
GFR, Estimated: >60 mL/min (ref >60)
Glucose, Bld: 99 mg/dL (ref 70–99)
Potassium: 3.4 mmol/L — ABNORMAL LOW (ref 3.5–5.1)
Sodium: 140 mmol/L (ref 135–145)
Total Bilirubin: 0.5 mg/dL (ref <1.2)
Total Protein: 5.6 g/dL — ABNORMAL LOW (ref 6.5–8.1)

## 2023-10-29 MED ORDER — DENOSUMAB 120 MG/1.7ML ~~LOC~~ SOLN
120.0000 mg | Freq: Once | SUBCUTANEOUS | Status: AC
  Administered 2023-10-29: 120 mg via SUBCUTANEOUS
  Filled 2023-10-29: qty 1.7

## 2023-10-29 MED ORDER — SODIUM CHLORIDE 0.9% FLUSH
10.0000 mL | INTRAVENOUS | Status: DC | PRN
  Administered 2023-10-29: 10 mL via INTRAVENOUS

## 2023-10-29 MED ORDER — HEPARIN SOD (PORK) LOCK FLUSH 100 UNIT/ML IV SOLN
500.0000 [IU] | Freq: Once | INTRAVENOUS | Status: AC
  Administered 2023-10-29: 500 [IU] via INTRAVENOUS

## 2023-10-29 NOTE — Progress Notes (Signed)
Patient presents today for Xgeva injection per providers order.  Vital signs WNL.  Labs pending.  Patient states that he has been taking a multivitamin with Calcium and Vitamin D, has had no jaw pin, and no prior or upcoming dental work.  Calcium noted to be 8.6.  Stable during administration without incident; injection site WNL; see MAR for injection details.  Patient tolerated procedure well and without incident.  No questions or complaints noted at this time.

## 2023-10-29 NOTE — Progress Notes (Signed)
  Care Coordination Note  10/29/2023 Name: Bennet Kujawa MRN: [id number] DOB: 16-Jul-1951  Levada Schilling Rivere is a 72 y.o. year old male who is a primary care patient of [name], MD and is actively engaged with the care management team. I reached out to [name] by phone today to assist with re-scheduling a follow up visit with the RN Case Manager  Follow up plan: Telephone appointment with care management team member scheduled for:11/10/23  [facility] Coordination Care Guide  Direct Dial: [phone]

## 2023-10-29 NOTE — Patient Instructions (Signed)
[facility] - A DEPT OF [facility]  Discharge Instructions: Thank you for choosing [facility] to provide your oncology and hematology care.  If you have a lab appointment with the Cancer Center - please note that after April 8th, 2024, all labs will be drawn in the cancer center.  You do not have to check in or register with the main entrance as you have in the past but will complete your check-in in the cancer center.  Wear comfortable clothing and clothing appropriate for easy access to any Portacath or PICC line.   We strive to give you quality time with your provider. You may need to reschedule your appointment if you arrive late (15 or more minutes).  Arriving late affects you and other patients whose appointments are after yours.  Also, if you miss three or more appointments without notifying the office, you may be dismissed from the clinic at the provider's discretion.      For prescription refill requests, have your pharmacy contact our office and allow 72 hours for refills to be completed.    Today you received the following chemotherapy and/or immunotherapy agents Xgeva      To help prevent nausea and vomiting after your treatment, we encourage you to take your nausea medication as directed.  BELOW ARE SYMPTOMS THAT SHOULD BE REPORTED IMMEDIATELY: *FEVER GREATER THAN 100.4 F (38 C) OR HIGHER *CHILLS OR SWEATING *NAUSEA AND VOMITING THAT IS NOT CONTROLLED WITH YOUR NAUSEA MEDICATION *UNUSUAL SHORTNESS OF BREATH *UNUSUAL BRUISING OR BLEEDING *URINARY PROBLEMS (pain or burning when urinating, or frequent urination) *BOWEL PROBLEMS (unusual diarrhea, constipation, pain near the anus) TENDERNESS IN MOUTH AND THROAT WITH OR WITHOUT PRESENCE OF ULCERS (sore throat, sores in mouth, or a toothache) UNUSUAL RASH, SWELLING OR PAIN  UNUSUAL VAGINAL DISCHARGE OR ITCHING   Items with * indicate a potential emergency and should be followed up as  soon as possible or go to the Emergency Department if any problems should occur.  Please show the CHEMOTHERAPY ALERT CARD or IMMUNOTHERAPY ALERT CARD at check-in to the Emergency Department and triage nurse.  Should you have questions after your visit or need to cancel or reschedule your appointment, please contact [facility] - A DEPT OF [facility] [phone]  and follow the prompts.  Office hours are 8:00 a.m. to 4:30 p.m. Monday - Friday. Please note that voicemails left after 4:00 p.m. may not be returned until the following business day.  We are closed weekends and major holidays. You have access to a nurse at all times for urgent questions. Please call the main number to the clinic [phone] and follow the prompts.  For any non-urgent questions, you may also contact your provider using MyChart. We now offer e-Visits for anyone 73 and older to request care online for non-urgent symptoms. For details visit [email or web address].   Also download the MyChart app! Go to the app store, search "MyChart", open the app, select [facility], and log in with your MyChart username and password.

## 2023-11-02 NOTE — Progress Notes (Signed)
History of Present Illness: Luis Stewart returns for f/u of BPH as well as PCa   Prior history of elevated PSA--s/p TRUS/Bx in [place] in 2005 and 2008. Apparently, all cores were negative but some were not indicative of prostate tissue at all.   Because of a high PCA 3 test in this office, he underwent TRUS/Bx on 6.23.2016. 1/12 cores were positive for adenocarcinoma-10% of the core in the right lateral base revealed Gleason 3+3 equal 6 adenocarcinoma. Prostate volume was 175 cc. PSA was 16.66. PSA density 0.09. He does not have significant lower urinary tract symptomatology.   He consulted with Dr. Marlou Porch in [place] to consider robotic prostatectomy. Due to his low volume prostate cancer, and very low PSA density, it was recommended that he continue with active surveillance.   1.30.2017: Surveillance TRUS/Bx , following a prostatic MRI which revealed no suspicious prostatic lesions and correlated the patient's large prostate. At that time, all 12 cores were negative for prostate cancer. Prostatic volume was 190 mL.   8.21.2019: fusion TRUS/Bx. Recent prostate MRI--volume 220 ml. No significant lesions. No evidence transcapsular/perineural,SV/bony or lymphatic disease. Path--1 core (left apex) revealed GS 3+3 pattern in 5% of core.    PSA followup:   10.18.2016--17.16 11.14.2017--15.4 5.25.2018--17.9 4.29.2019--18.4 2.25.2020--15.9 3.30.2021--15.5 3.30.2022--22.4   9.24.2021 MRI prostate. Prostate volume 250 mL. No significant change compared to prior exam. No radiographic evidence of high-grade prostate carcinoma. PI-RADS 2: Low (clinically significant cancer is unlikely to be present)   4.5.2022: PSA 22.4.   10.4.2022:  PSA 34.5 this was drawn while the patient was being treated for an active COVID infection.  He was put on finasteride after his last visit.  He was unable to tolerate it due to worsening of his lower urinary tract symptoms.   3.23.2023: PSMA PET scan-- 1.  Multifocal radiotracer avid prostate cancer skeletal metastasis. Broad lytic lesion within the sacrum with associated radiotracer activity. Multiple discrete lesions in the thoracic lumbar spine and ribs with minimal CT change. 2. Markedly enlarged prostate gland. Focal activity inferior LEFT aspect the gland is suggestive of prostate adenocarcinoma. 3. No nodal metastasis in the abdomen pelvis. No visceral metastasis.    11.7.2023: Here for recheck.  He is on [place] monthly for metastatic prostate cancer (confirmed by PSMA PET scan).  He is receiving regular infusions for multiple myeloma.  He is having hot flashes and low energy level which may be attributable to his androgen deprivation therapy.  PSA prior to initiation of ADT was around 27, most recently 2.1.  5.7.2024: Here for recheck.  Most recent PSA up slightly to 1.33.  Prior to that it was 0.88 just over a month ago.  He donated stem cells for a potential stem cell transplant last week.  He is having no bony pain.  He is on tamsulosin-lower urinary tract symptoms got worse after he was started on ADT last spring.  11.12.2024: PSA 0.57.  He received a 24-month Eligard dose on the 10th of last month with Dr. Ellin Saba.  He is on Xgeva administered for his metastatic prostate cancer as well as his myeloma.  No current bony pain.  He takes vitamin D every other day.  Also on tamsulosin every other day with adequate voiding symptoms-IPSS 8.  Last bone density study was in June 2023.   Past Medical History:  Diagnosis Date   Arthritis    Asbestosis (HCC)    Closed fracture of distal end of fibula with tibia with routine healing 05/28/2020  COVID-19 09/03/2021   Cyst of kidney, acquired    Cystic disease of liver    Enlarged prostate    Headache    History of fracture of leg    Right, childhood   Hx of migraine headaches    Hyperlipidemia    Multiple myeloma (HCC)    Neuropathy    PE (pulmonary thromboembolism) (HCC)    After  knee surgery   Prostate cancer (HCC) 2015   Adenocarcinoma by biopsy    Past Surgical History:  Procedure Laterality Date   BIOPSY PROSTATE  2003 and 2005   COLONOSCOPY  11/28/2008   Dr. Rourk:internal hemorrhoids/tortus colon/ascending colon polyps, tubular adenomas   COLONOSCOPY N/A 09/04/2014   Procedure: COLONOSCOPY;  Surgeon: Corbin Ade, MD;  [facility];  Service: Endoscopy;  Laterality: N/A;  9:30   COLONOSCOPY N/A 07/17/2017   Procedure: COLONOSCOPY;  Surgeon: West Bali, MD;  [facility];  Service: Endoscopy;  Laterality: N/A;   COLONOSCOPY WITH PROPOFOL N/A 10/05/2023   Procedure: COLONOSCOPY WITH PROPOFOL;  Surgeon: Corbin Ade, MD;  [facility];  Service: Endoscopy;  Laterality: N/A;  10:00AM, ASA 3   ESOPHAGOGASTRODUODENOSCOPY N/A 07/16/2017   Procedure: ESOPHAGOGASTRODUODENOSCOPY (EGD);  Surgeon: West Bali, MD;  [facility];  Service: Endoscopy;  Laterality: N/A;   GIVENS CAPSULE STUDY  07/16/2017   Procedure: GIVENS CAPSULE STUDY;  Surgeon: West Bali, MD;  [facility];  Service: Endoscopy;;   JOINT REPLACEMENT N/A    Phreesia 01/12/2021   POLYPECTOMY  07/17/2017   Procedure: POLYPECTOMY;  Surgeon: West Bali, MD;  [facility];  Service: Endoscopy;;  cecal   POLYPECTOMY  10/05/2023   Procedure: POLYPECTOMY;  Surgeon: Corbin Ade, MD;  [facility];  Service: Endoscopy;;   PORTACATH PLACEMENT Left 04/18/2022   Procedure: INSERTION PORT-A-CATH;  Surgeon: Franky Macho, MD;  Location: [facility];  Service: General;  Laterality: Left;   TOTAL KNEE ARTHROPLASTY Right 06/10/2017   TOTAL KNEE ARTHROPLASTY Right 06/10/2017   Procedure: TOTAL KNEE ARTHROPLASTY;  Surgeon: Loreta Ave, MD;  Location: [facility] OR;  Service: Orthopedics;  Laterality: Right;    Home Medications:  Allergies as of 11/03/2023       Reactions   Sulfa Antibiotics Other (See Comments)   Unknown  reaction. Childhood reaction        Medication List        Accurate as of November 02, 2023  6:00 PM. If you have any questions, ask your nurse or doctor.          acetaminophen 500 MG tablet Commonly known as: TYLENOL Take 1,000 mg by mouth every 6 (six) hours as needed for mild pain or moderate pain.   acyclovir 400 MG tablet Commonly known as: ZOVIRAX TAKE 1 TABLET BY MOUTH TWICE A DAY   DULoxetine 30 MG capsule Commonly known as: CYMBALTA TAKE 1 CAPSULE BY MOUTH EVERY DAY   ferrous sulfate 325 (65 FE) MG EC tablet Take 325 mg by mouth daily.   lenalidomide 15 MG capsule Commonly known as: REVLIMID Take 1 capsule (15 mg total) by mouth daily. Take for 21 days, then hold for 7 days. Repeat every 28 days.   multivitamin with minerals Tabs tablet Take 1 tablet by mouth daily with breakfast. Centrum Silver for Men   nitroGLYCERIN 0.4 MG SL tablet Commonly known as: NITROSTAT Place 1 tablet (0.4 mg total) under the tongue every  5 (five) minutes x 3 doses as needed for chest pain (If no relief after 3rd dose, call or go to ED.).   pantoprazole 40 MG tablet Commonly known as: PROTONIX TAKE 1 TABLET BY MOUTH EVERY OTHER DAY   rosuvastatin 5 MG tablet Commonly known as: CRESTOR Take 1 tablet (5 mg total) by mouth daily.   tamsulosin 0.4 MG Caps capsule Commonly known as: FLOMAX Take 0.4 mg by mouth every other day.   Vitamin D 50 MCG (2000 UT) Caps Take 2,000 Units by mouth every other day.        Allergies:  Allergies  Allergen Reactions   Sulfa Antibiotics Other (See Comments)    Unknown reaction. Childhood reaction    Family History  Problem Relation Age of Onset   Diabetes Mother    Hypertension Mother    Obesity Mother    Heart disease Mother    Mental illness Mother    Hypertension Sister    Obesity Sister    Arthritis Sister    Deep vein thrombosis Father    Dementia Father    Heart disease Father        CABG   Colon cancer Neg Hx      Social History:  reports that he has never smoked. He has never used smokeless tobacco. He reports that he does not drink alcohol and does not use drugs.  ROS: A complete review of systems was performed.  All systems are negative except for pertinent findings as noted.  Physical Exam:  Vital signs in last 24 hours: There were no vitals taken for this visit. Constitutional:  Alert and oriented, No acute distress Cardiovascular: Regular rate  Respiratory: Normal respiratory effort Neurologic: Grossly intact, no focal deficits Psychiatric: Normal mood and affect  I have reviewed prior pt notes  I have reviewed notes from referring/previous physicians--Dr. Marice Potter notes  I have independently reviewed prior imaging-looked at his CT images from 2023.  Bladder empties well.  Prostate volume huge at that time.  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  He is only currently on androgen deprivation therapy with every 6 month Eligard as well as denosumab  -BPH, minimal symptoms but he has a huge gland.  On every other day tamsulosin  Plan:   -I told him he can try taking himself off of the tamsulosin  -I agree with current therapy administered by Dr. Ellin Saba.  -I will see back in 6 months for recheck

## 2023-11-03 ENCOUNTER — Ambulatory Visit: Payer: Medicare HMO | Admitting: Urology

## 2023-11-03 ENCOUNTER — Encounter: Payer: Self-pay | Admitting: Urology

## 2023-11-03 VITALS — BP 120/68 | HR 60 | Ht 74.0 in | Wt 200.8 lb

## 2023-11-03 DIAGNOSIS — C61 Malignant neoplasm of prostate: Secondary | ICD-10-CM

## 2023-11-03 DIAGNOSIS — N401 Enlarged prostate with lower urinary tract symptoms: Secondary | ICD-10-CM | POA: Diagnosis not present

## 2023-11-03 DIAGNOSIS — R350 Frequency of micturition: Secondary | ICD-10-CM | POA: Diagnosis not present

## 2023-11-10 ENCOUNTER — Ambulatory Visit: Payer: Self-pay | Admitting: *Deleted

## 2023-11-10 NOTE — Patient Outreach (Signed)
Care Coordination   Follow Up Visit Note   11/10/2023 Name: Luis Stewart MRN: [id number] DOB: July 04, 1951  Luis Stewart is a 72 y.o. year old male who sees Luis Halon, MD for primary care. I spoke with  Luis Stewart by phone today.  What matters to the patients health and wellness today?  Managing, Patient had some upper respiratory worsening symptom that he reports is improving Chest congestion, cough over last few weeks, sinus drainage, productive white sputum. Agrees to report color changes in sputum to medical staff   Recent loss/passing of his father in law (57 yrs old) Majority of his wife family are local He lost his parents in 2018  Agreed to outreach if grief/loss resources needed  Metastatic prostate cancer/myeloma  on 10/10 started androgen deprivation therapy with 22-month Eligard dose + Denosumab. He is on xgeva /tamsulosin for myeloma.Last xgeva injection on 10/29/23 Treatments started back on Thursday night    New eye gtt- just started this week Denies any worsening symptoms like blurriness, tearing, headaches  Voiced not concerns to be shared with medical providers at this time   Goals Addressed             This Visit's Progress    Grief, eye treatment, multiple myeloma, pain, prostate -care coordination services   On track    Interventions Today    Flowsheet Row Most Recent Value  Chronic Disease   Chronic disease during today's visit Other  [upper respiratory symptoms, grief/loss of family, oncology treatments]  General Interventions   General Interventions Discussed/Reviewed General Interventions Reviewed, Doctor Visits, Community Resources  [discussed grief resources/offered/ not needed at this time]  Doctor Visits Discussed/Reviewed PCP, Specialist, Doctor Visits Reviewed  PCP/Specialist Visits Compliance with follow-up visit  Education Interventions   Education Provided Provided Education  Provided Verbal  Education On Eye Care, Medication, When to see the doctor, Mental Health/Coping with Illness, Community Resources  [grief/loss]  Mental Health Interventions   Mental Health Discussed/Reviewed Coping Strategies, Mental Health Reviewed, Grief and Loss  [loss of his father in law buried on 11/07/23- managing well]  Pharmacy Interventions   Pharmacy Dicussed/Reviewed Pharmacy Topics Reviewed, Medications and their functions, Affording Medications  [oncology & eye treatments]  Safety Interventions   Safety Discussed/Reviewed Safety Discussed, Home Safety  [no concerns presently]      Interventions Today    Flowsheet Row Most Recent Value  Chronic Disease   Chronic disease during today's visit Other  [upper respiratory symptoms, grief/loss of family, oncology treatments]  General Interventions   General Interventions Discussed/Reviewed General Interventions Reviewed, Doctor Visits, Community Resources  [discussed grief resources/offered/ not needed at this time]  Doctor Visits Discussed/Reviewed PCP, Specialist, Doctor Visits Reviewed  PCP/Specialist Visits Compliance with follow-up visit  Education Interventions   Education Provided Provided Education  Provided Verbal Education On Eye Care, Medication, When to see the doctor, Mental Health/Coping with Illness, Community Resources  [grief/loss]  Mental Health Interventions   Mental Health Discussed/Reviewed Coping Strategies, Mental Health Reviewed, Grief and Loss  [loss of his father in law buried on 11/07/23- managing well]  Pharmacy Interventions   Pharmacy Dicussed/Reviewed Pharmacy Topics Reviewed, Medications and their functions, Affording Medications  [oncology & eye treatments]  Safety Interventions   Safety Discussed/Reviewed Safety Discussed, Home Safety  [no concerns presently]                SDOH assessments and interventions completed:  No     Care Coordination Interventions:  Yes, provided  Follow up plan: Follow  up call scheduled for 12/10/23    Encounter Outcome:  Patient Visit Completed   Luis Bradford L. Noelle Penner, RN, BSN, [facility]  VBCI Care Management Coordinator  [phone]  Fax: [phone]

## 2023-11-10 NOTE — Patient Instructions (Signed)
Visit Information  Thank you for taking time to visit with me today. Please don't hesitate to contact me if I can be of assistance to you.   Following are the goals we discussed today:   Goals Addressed             This Visit's Progress    Grief, eye treatment, multiple myeloma, pain, prostate -care coordination services   On track    Interventions Today    Flowsheet Row Most Recent Value  Chronic Disease   Chronic disease during today's visit Other  [upper respiratory symptoms, grief/loss of family, oncology treatments]  General Interventions   General Interventions Discussed/Reviewed General Interventions Reviewed, Doctor Visits, Community Resources  [discussed grief resources/offered/ not needed at this time]  Doctor Visits Discussed/Reviewed PCP, Specialist, Doctor Visits Reviewed  PCP/Specialist Visits Compliance with follow-up visit  Education Interventions   Education Provided Provided Education  Provided Verbal Education On Eye Care, Medication, When to see the doctor, Mental Health/Coping with Illness, Community Resources  [grief/loss]  Mental Health Interventions   Mental Health Discussed/Reviewed Coping Strategies, Mental Health Reviewed, Grief and Loss  [loss of his father in law buried on 11/07/23- managing well]  Pharmacy Interventions   Pharmacy Dicussed/Reviewed Pharmacy Topics Reviewed, Medications and their functions, Affording Medications  [oncology & eye treatments]  Safety Interventions   Safety Discussed/Reviewed Safety Discussed, Home Safety  [no concerns presently]      Interventions Today    Flowsheet Row Most Recent Value  Chronic Disease   Chronic disease during today's visit Other  [upper respiratory symptoms, grief/loss of family, oncology treatments]  General Interventions   General Interventions Discussed/Reviewed General Interventions Reviewed, Doctor Visits, Community Resources  [discussed grief resources/offered/ not needed at this time]   Doctor Visits Discussed/Reviewed PCP, Specialist, Doctor Visits Reviewed  PCP/Specialist Visits Compliance with follow-up visit  Education Interventions   Education Provided Provided Education  Provided Verbal Education On Eye Care, Medication, When to see the doctor, Mental Health/Coping with Illness, Community Resources  [grief/loss]  Mental Health Interventions   Mental Health Discussed/Reviewed Coping Strategies, Mental Health Reviewed, Grief and Loss  [loss of his father in law buried on 11/07/23- managing well]  Pharmacy Interventions   Pharmacy Dicussed/Reviewed Pharmacy Topics Reviewed, Medications and their functions, Affording Medications  [oncology & eye treatments]  Safety Interventions   Safety Discussed/Reviewed Safety Discussed, Home Safety  [no concerns presently]                Our next appointment is by telephone on 12/10/23 at 1130  Please call the care guide team at [phone] if you need to cancel or reschedule your appointment.   If you are experiencing a Mental Health or Behavioral Health Crisis or need someone to talk to, please call the Suicide and Crisis Lifeline: 988 call the [place] National Suicide Prevention Lifeline: 770-259-2443 or TTY: 571-051-7493 TTY 720-122-8925) to talk to a trained counselor call 1-800-273-TALK (toll free, 24 hour hotline) call the [facility]: 224-097-2403 call 911   Patient verbalizes understanding of instructions and care plan provided today and agrees to view in MyChart. Active MyChart status and patient understanding of how to access instructions and care plan via MyChart confirmed with patient.     The patient has been provided with contact information for the care management team and has been advised to call with any health related questions or concerns.    Ashmi Blas L. Noelle Penner, RN, BSN, [facility]  VBCI Care Management Coordinator  [phone]  Fax: [phone]

## 2023-11-17 ENCOUNTER — Other Ambulatory Visit: Payer: Self-pay

## 2023-11-17 DIAGNOSIS — C9000 Multiple myeloma not having achieved remission: Secondary | ICD-10-CM

## 2023-11-17 MED ORDER — LENALIDOMIDE 15 MG PO CAPS: 15.0000 mg | ORAL_CAPSULE | Freq: Every day | ORAL | 0 refills | Status: DC

## 2023-11-17 NOTE — Telephone Encounter (Signed)
Chart reviewed. Revlimid refilled per last office note with Dr. Ellin Saba.

## 2023-11-26 ENCOUNTER — Inpatient Hospital Stay: Payer: Medicare HMO | Attending: Hematology

## 2023-11-26 ENCOUNTER — Inpatient Hospital Stay: Payer: Medicare HMO

## 2023-11-26 VITALS — BP 134/67 | HR 56 | Temp 98.5°F | Resp 20

## 2023-11-26 DIAGNOSIS — C61 Malignant neoplasm of prostate: Secondary | ICD-10-CM | POA: Diagnosis present

## 2023-11-26 DIAGNOSIS — C9000 Multiple myeloma not having achieved remission: Secondary | ICD-10-CM | POA: Diagnosis present

## 2023-11-26 DIAGNOSIS — C7951 Secondary malignant neoplasm of bone: Secondary | ICD-10-CM | POA: Insufficient documentation

## 2023-11-26 LAB — CBC WITH DIFFERENTIAL/PLATELET
Abs Immature Granulocytes: 0 10*3/uL (ref 0.00–0.07)
Basophils Absolute: 0 10*3/uL (ref 0.0–0.1)
Basophils Relative: 1 %
Eosinophils Absolute: 0.3 10*3/uL (ref 0.0–0.5)
Eosinophils Relative: 10 %
HCT: 34.4 % — ABNORMAL LOW (ref 39.0–52.0)
Hemoglobin: 11.3 g/dL — ABNORMAL LOW (ref 13.0–17.0)
Immature Granulocytes: 0 %
Lymphocytes Relative: 27 %
Lymphs Abs: 0.8 10*3/uL (ref 0.7–4.0)
MCH: 30.1 pg (ref 26.0–34.0)
MCHC: 32.8 g/dL (ref 30.0–36.0)
MCV: 91.5 fL (ref 80.0–100.0)
Monocytes Absolute: 0.3 10*3/uL (ref 0.1–1.0)
Monocytes Relative: 10 %
Neutro Abs: 1.5 10*3/uL — ABNORMAL LOW (ref 1.7–7.7)
Neutrophils Relative %: 52 %
Platelets: 131 10*3/uL — ABNORMAL LOW (ref 150–400)
RBC: 3.76 MIL/uL — ABNORMAL LOW (ref 4.22–5.81)
RDW: 16.1 % — ABNORMAL HIGH (ref 11.5–15.5)
WBC: 2.9 10*3/uL — ABNORMAL LOW (ref 4.0–10.5)
nRBC: 0 % (ref 0.0–0.2)

## 2023-11-26 LAB — COMPREHENSIVE METABOLIC PANEL
ALT: 27 U/L (ref 0–44)
AST: 23 U/L (ref 15–41)
Albumin: 3.5 g/dL (ref 3.5–5.0)
Alkaline Phosphatase: 37 U/L — ABNORMAL LOW (ref 38–126)
Anion gap: 8 (ref 5–15)
BUN: 14 mg/dL (ref 8–23)
CO2: 26 mmol/L (ref 22–32)
Calcium: 8.5 mg/dL — ABNORMAL LOW (ref 8.9–10.3)
Chloride: 106 mmol/L (ref 98–111)
Creatinine, Ser: 1.08 mg/dL (ref 0.61–1.24)
GFR, Estimated: >60 mL/min (ref >60)
Glucose, Bld: 99 mg/dL (ref 70–99)
Potassium: 3.6 mmol/L (ref 3.5–5.1)
Sodium: 140 mmol/L (ref 135–145)
Total Bilirubin: 0.6 mg/dL (ref <1.2)
Total Protein: 6 g/dL — ABNORMAL LOW (ref 6.5–8.1)

## 2023-11-26 LAB — MAGNESIUM: Magnesium: 1.9 mg/dL (ref 1.7–2.4)

## 2023-11-26 MED ORDER — DENOSUMAB 120 MG/1.7ML ~~LOC~~ SOLN
120.0000 mg | Freq: Once | SUBCUTANEOUS | Status: AC
  Administered 2023-11-26: 120 mg via SUBCUTANEOUS
  Filled 2023-11-26: qty 1.7

## 2023-11-26 MED ORDER — HEPARIN SOD (PORK) LOCK FLUSH 100 UNIT/ML IV SOLN
500.0000 [IU] | Freq: Once | INTRAVENOUS | Status: AC
  Administered 2023-11-26: 500 [IU] via INTRAVENOUS

## 2023-11-26 MED ORDER — SODIUM CHLORIDE 0.9% FLUSH
10.0000 mL | INTRAVENOUS | Status: DC | PRN
  Administered 2023-11-26: 10 mL via INTRAVENOUS

## 2023-11-26 NOTE — Patient Instructions (Signed)
Denosumab Injection (Oncology) What is this medication? DENOSUMAB (den oh SUE mab) prevents weakened bones caused by cancer. It may also be used to treat noncancerous bone tumors that cannot be removed by surgery. It can also be used to treat high calcium levels in the blood caused by cancer. It works by blocking a protein that causes bones to break down quickly. This slows down the release of calcium from bones, which lowers calcium levels in your blood. It also makes your bones stronger and less likely to break (fracture). This medicine may be used for other purposes; ask your health care provider or pharmacist if you have questions. COMMON BRAND NAME(S): XGEVA What should I tell my care team before I take this medication? They need to know if you have any of these conditions: Dental disease Having surgery or tooth extraction Infection Kidney disease Low levels of calcium or vitamin D in the blood Malnutrition On hemodialysis Skin conditions or sensitivity Thyroid or parathyroid disease An unusual reaction to denosumab, other medications, foods, dyes, or preservatives Pregnant or trying to get pregnant Breast-feeding How should I use this medication? This medication is for injection under the skin. It is given by your care team in a hospital or clinic setting. A special MedGuide will be given to you before each treatment. Be sure to read this information carefully each time. Talk to your care team about the use of this medication in children. While it may be prescribed for children as young as 13 years for selected conditions, precautions do apply. Overdosage: If you think you have taken too much of this medicine contact a poison control center or emergency room at once. NOTE: This medicine is only for you. Do not share this medicine with others. What if I miss a dose? Keep appointments for follow-up doses. It is important not to miss your dose. Call your care team if you are unable to  keep an appointment. What may interact with this medication? Do not take this medication with any of the following: Other medications containing denosumab This medication may also interact with the following: Medications that lower your chance of fighting infection Steroid medications, such as prednisone or cortisone This list may not describe all possible interactions. Give your health care provider a list of all the medicines, herbs, non-prescription drugs, or dietary supplements you use. Also tell them if you smoke, drink alcohol, or use illegal drugs. Some items may interact with your medicine. What should I watch for while using this medication? Your condition will be monitored carefully while you are receiving this medication. You may need blood work while taking this medication. This medication may increase your risk of getting an infection. Call your care team for advice if you get a fever, chills, sore throat, or other symptoms of a cold or flu. Do not treat yourself. Try to avoid being around people who are sick. You should make sure you get enough calcium and vitamin D while you are taking this medication, unless your care team tells you not to. Discuss the foods you eat and the vitamins you take with your care team. Some people who take this medication have severe bone, joint, or muscle pain. This medication may also increase your risk for jaw problems or a broken thigh bone. Tell your care team right away if you have severe pain in your jaw, bones, joints, or muscles. Tell your care team if you have any pain that does not go away or that gets worse. Talk   to your care team if you may be pregnant. Serious birth defects can occur if you take this medication during pregnancy and for 5 months after the last dose. You will need a negative pregnancy test before starting this medication. Contraception is recommended while taking this medication and for 5 months after the last dose. Your care team  can help you find the option that works for you. What side effects may I notice from receiving this medication? Side effects that you should report to your care team as soon as possible: Allergic reactions--skin rash, itching, hives, swelling of the face, lips, tongue, or throat Bone, joint, or muscle pain Low calcium level--muscle pain or cramps, confusion, tingling, or numbness in the hands or feet Osteonecrosis of the jaw--pain, swelling, or redness in the mouth, numbness of the jaw, poor healing after dental work, unusual discharge from the mouth, visible bones in the mouth Side effects that usually do not require medical attention (report to your care team if they continue or are bothersome): Cough Diarrhea Fatigue Headache Nausea This list may not describe all possible side effects. Call your doctor for medical advice about side effects. You may report side effects to FDA at 1-800-FDA-1088. Where should I keep my medication? This medication is given in a hospital or clinic. It will not be stored at home. NOTE: This sheet is a summary. It may not cover all possible information. If you have questions about this medicine, talk to your doctor, pharmacist, or health care provider.  2024 Elsevier/Gold Standard (2022-04-30 00:00:00)  

## 2023-11-26 NOTE — Progress Notes (Signed)
[name] 8.5. Patient tolerated Xgeva injection with no complaints voiced.  Site clean and dry with no bruising or swelling noted at site.  See MAR for details.  Band aid applied.  Patient stable during and after injection.  Vss with discharge and left in satisfactory condition with no s/s of distress noted. All follow ups as scheduled.   Luis Stewart [employer]

## 2023-12-10 ENCOUNTER — Ambulatory Visit: Payer: Self-pay | Admitting: *Deleted

## 2023-12-10 NOTE — Patient Instructions (Signed)
Visit Information  Thank you for taking time to visit with me today. Please don't hesitate to contact me if I can be of assistance to you.   Following are the goals we discussed today:   Goals Addressed             This Visit's Progress    Grief, eye treatment, multiple myeloma, pain, prostate -care coordination services   On track    Interventions Today    Flowsheet Row Most Recent Value  Chronic Disease   Chronic disease during today's visit Other  [Oncology treatments, loss of family/support, new eye drops, appointments]  General Interventions   General Interventions Discussed/Reviewed General Interventions Reviewed, Doctor Visits, Community Resources  Doctor Visits Discussed/Reviewed Doctor Visits Reviewed, PCP, Specialist  PCP/Specialist Visits Compliance with follow-up visit  Mental Health Interventions   Mental Health Discussed/Reviewed Mental Health Reviewed, Coping Strategies, Grief and Loss  Pharmacy Interventions   Pharmacy Dicussed/Reviewed Pharmacy Topics Reviewed, Affording Medications              Our next appointment is by telephone on 01/13/24 at 1:45 pm  Please call the care guide team at [phone] if you need to cancel or reschedule your appointment.   If you are experiencing a Mental Health or Behavioral Health Crisis or need someone to talk to, please call the Suicide and Crisis Lifeline: 988 call the [place] National Suicide Prevention Lifeline: 6811220379 or TTY: 352-561-7440 TTY (347)528-6668) to talk to a trained counselor call 1-800-273-TALK (toll free, 24 hour hotline) call the [facility]: 214-406-3645 call 911   Patient verbalizes understanding of instructions and care plan provided today and agrees to view in MyChart. Active MyChart status and patient understanding of how to access instructions and care plan via MyChart confirmed with patient.     The patient has been provided with contact information for the care  management team and has been advised to call with any health related questions or concerns.   Sylis Ketchum L. Noelle Penner, RN, BSN, [facility]  VBCI Care Management Coordinator  [phone]  Fax: [phone]

## 2023-12-10 NOTE — Patient Outreach (Signed)
  Care Coordination   Follow Up Visit Note   12/10/2023 Name: Luis Stewart MRN: [id number] DOB: 1951-06-07  Luis Stewart is a 72 y.o. year old male who sees Luis Halon, MD for primary care. I spoke with  Luis Stewart by phone today.  What matters to the patients health and wellness today?  Oncology treatments, loss of family/support, new eye drops, appointments Oncology  Treatments now every 3 months, only 1 medicine   See oncologist again on 12/24/23  New eye drops are being tolerated    Grief- he, his wife and family are doing well His wife had 3 sisters & brothers that have been around a lot for support  Denies other medical and social determinants of health (SDOH) needs   Goals Addressed             This Visit's Progress    Grief, eye treatment, multiple myeloma, pain, prostate -care coordination services   On track    Interventions Today    Flowsheet Row Most Recent Value  Chronic Disease   Chronic disease during today's visit Other  [Oncology treatments, loss of family/support, new eye drops, appointments]  General Interventions   General Interventions Discussed/Reviewed General Interventions Reviewed, Doctor Visits, Community Resources  Doctor Visits Discussed/Reviewed Doctor Visits Reviewed, PCP, Specialist  PCP/Specialist Visits Compliance with follow-up visit  Mental Health Interventions   Mental Health Discussed/Reviewed Mental Health Reviewed, Coping Strategies, Grief and Loss  Pharmacy Interventions   Pharmacy Dicussed/Reviewed Pharmacy Topics Reviewed, Affording Medications              SDOH assessments and interventions completed:  Yes  SDOH Interventions Today    Flowsheet Row Most Recent Value  SDOH Interventions   Food Insecurity Interventions Intervention Not Indicated  Housing Interventions Intervention Not Indicated  Transportation Interventions Intervention Not Indicated  Financial Strain  Interventions Intervention Not Indicated  Health Literacy Interventions Intervention Not Indicated        Care Coordination Interventions:  Yes, provided   Follow up plan: Follow up call scheduled for 01/13/24 1:45 pm    Encounter Outcome:  Patient Visit Completed   Luis Stewart L. Luis Penner, RN, BSN, [facility]  VBCI Care Management Coordinator  [phone]  Fax: [phone]

## 2023-12-17 ENCOUNTER — Other Ambulatory Visit: Payer: Self-pay

## 2023-12-17 ENCOUNTER — Inpatient Hospital Stay: Payer: Medicare HMO

## 2023-12-17 DIAGNOSIS — C9000 Multiple myeloma not having achieved remission: Secondary | ICD-10-CM

## 2023-12-17 DIAGNOSIS — C61 Malignant neoplasm of prostate: Secondary | ICD-10-CM

## 2023-12-17 LAB — CBC WITH DIFFERENTIAL/PLATELET
Abs Immature Granulocytes: 0 10*3/uL (ref 0.00–0.07)
Basophils Absolute: 0 10*3/uL (ref 0.0–0.1)
Basophils Relative: 2 %
Eosinophils Absolute: 0.2 10*3/uL (ref 0.0–0.5)
Eosinophils Relative: 8 %
HCT: 35.3 % — ABNORMAL LOW (ref 39.0–52.0)
Hemoglobin: 11.8 g/dL — ABNORMAL LOW (ref 13.0–17.0)
Immature Granulocytes: 0 %
Lymphocytes Relative: 24 %
Lymphs Abs: 0.7 10*3/uL (ref 0.7–4.0)
MCH: 31.2 pg (ref 26.0–34.0)
MCHC: 33.4 g/dL (ref 30.0–36.0)
MCV: 93.4 fL (ref 80.0–100.0)
Monocytes Absolute: 0.3 10*3/uL (ref 0.1–1.0)
Monocytes Relative: 10 %
Neutro Abs: 1.5 10*3/uL — ABNORMAL LOW (ref 1.7–7.7)
Neutrophils Relative %: 56 %
Platelets: 138 10*3/uL — ABNORMAL LOW (ref 150–400)
RBC: 3.78 MIL/uL — ABNORMAL LOW (ref 4.22–5.81)
RDW: 16.3 % — ABNORMAL HIGH (ref 11.5–15.5)
WBC: 2.7 10*3/uL — ABNORMAL LOW (ref 4.0–10.5)
nRBC: 0 % (ref 0.0–0.2)

## 2023-12-17 LAB — COMPREHENSIVE METABOLIC PANEL
ALT: 21 U/L (ref 0–44)
AST: 21 U/L (ref 15–41)
Albumin: 3.8 g/dL (ref 3.5–5.0)
Alkaline Phosphatase: 32 U/L — ABNORMAL LOW (ref 38–126)
Anion gap: 6 (ref 5–15)
BUN: 16 mg/dL (ref 8–23)
CO2: 26 mmol/L (ref 22–32)
Calcium: 8.5 mg/dL — ABNORMAL LOW (ref 8.9–10.3)
Chloride: 108 mmol/L (ref 98–111)
Creatinine, Ser: 0.97 mg/dL (ref 0.61–1.24)
GFR, Estimated: >60 mL/min (ref >60)
Glucose, Bld: 97 mg/dL (ref 70–99)
Potassium: 3.8 mmol/L (ref 3.5–5.1)
Sodium: 140 mmol/L (ref 135–145)
Total Bilirubin: 0.8 mg/dL (ref <1.2)
Total Protein: 5.9 g/dL — ABNORMAL LOW (ref 6.5–8.1)

## 2023-12-17 LAB — MAGNESIUM: Magnesium: 2 mg/dL (ref 1.7–2.4)

## 2023-12-17 MED ORDER — HEPARIN SOD (PORK) LOCK FLUSH 100 UNIT/ML IV SOLN
500.0000 [IU] | Freq: Once | INTRAVENOUS | Status: AC
  Administered 2023-12-17: 500 [IU] via INTRAVENOUS

## 2023-12-17 MED ORDER — LENALIDOMIDE 15 MG PO CAPS: 15.0000 mg | ORAL_CAPSULE | Freq: Every day | ORAL | 0 refills | Status: DC

## 2023-12-17 MED ORDER — SODIUM CHLORIDE 0.9% FLUSH
10.0000 mL | Freq: Once | INTRAVENOUS | Status: AC
  Administered 2023-12-17: 10 mL via INTRAVENOUS

## 2023-12-17 NOTE — Telephone Encounter (Signed)
Chart reviewed. Revlimid refilled per last office note with Dr. Katragadda.  

## 2023-12-17 NOTE — Progress Notes (Signed)
Patients port flushed without difficulty.  Good blood return noted with no bruising or swelling noted at site. Labs drawn per orders. Band aid applied.  VSS with discharge and left in satisfactory condition with no s/s of distress noted. All follow ups as scheduled.       Luis Stewart [employer]

## 2023-12-23 NOTE — Progress Notes (Incomplete)
   [facility] [address] [place] [address]    Clinic Day:  12/23/23   Referring physician: [name], MD  Patient Care Team: [name], MD as PCP - General (Internal Medicine) Jonelle Sidle, MD as PCP - C

## 2023-12-24 ENCOUNTER — Inpatient Hospital Stay: Payer: Medicare HMO | Attending: Hematology

## 2023-12-24 ENCOUNTER — Inpatient Hospital Stay: Payer: Medicare HMO | Admitting: Hematology

## 2023-12-24 ENCOUNTER — Inpatient Hospital Stay: Payer: Medicare HMO

## 2023-12-24 ENCOUNTER — Telehealth: Payer: Self-pay | Admitting: Cardiology

## 2023-12-24 DIAGNOSIS — C61 Malignant neoplasm of prostate: Secondary | ICD-10-CM | POA: Insufficient documentation

## 2023-12-24 DIAGNOSIS — D649 Anemia, unspecified: Secondary | ICD-10-CM | POA: Diagnosis not present

## 2023-12-24 DIAGNOSIS — D508 Other iron deficiency anemias: Secondary | ICD-10-CM

## 2023-12-24 DIAGNOSIS — M8580 Other specified disorders of bone density and structure, unspecified site: Secondary | ICD-10-CM | POA: Diagnosis not present

## 2023-12-24 DIAGNOSIS — C7951 Secondary malignant neoplasm of bone: Secondary | ICD-10-CM | POA: Insufficient documentation

## 2023-12-24 DIAGNOSIS — C9000 Multiple myeloma not having achieved remission: Secondary | ICD-10-CM | POA: Diagnosis present

## 2023-12-24 LAB — COMPREHENSIVE METABOLIC PANEL
ALT: 24 U/L (ref 0–44)
AST: 21 U/L (ref 15–41)
Albumin: 3.4 g/dL — ABNORMAL LOW (ref 3.5–5.0)
Alkaline Phosphatase: 35 U/L — ABNORMAL LOW (ref 38–126)
Anion gap: 6 (ref 5–15)
BUN: 19 mg/dL (ref 8–23)
CO2: 26 mmol/L (ref 22–32)
Calcium: 8.4 mg/dL — ABNORMAL LOW (ref 8.9–10.3)
Chloride: 107 mmol/L (ref 98–111)
Creatinine, Ser: 1.04 mg/dL (ref 0.61–1.24)
GFR, Estimated: >60 mL/min (ref >60)
Glucose, Bld: 101 mg/dL — ABNORMAL HIGH (ref 70–99)
Potassium: 3.4 mmol/L — ABNORMAL LOW (ref 3.5–5.1)
Sodium: 139 mmol/L (ref 135–145)
Total Bilirubin: 0.7 mg/dL (ref 0.0–1.2)
Total Protein: 6.2 g/dL — ABNORMAL LOW (ref 6.5–8.1)

## 2023-12-24 LAB — FERRITIN: Ferritin: 51 ng/mL (ref 24–336)

## 2023-12-24 LAB — CBC WITH DIFFERENTIAL/PLATELET
Abs Immature Granulocytes: 0 10*3/uL (ref 0.00–0.07)
Basophils Absolute: 0 10*3/uL (ref 0.0–0.1)
Basophils Relative: 1 %
Eosinophils Absolute: 0.3 10*3/uL (ref 0.0–0.5)
Eosinophils Relative: 12 %
HCT: 34.9 % — ABNORMAL LOW (ref 39.0–52.0)
Hemoglobin: 11.8 g/dL — ABNORMAL LOW (ref 13.0–17.0)
Immature Granulocytes: 0 %
Lymphocytes Relative: 30 %
Lymphs Abs: 0.7 10*3/uL (ref 0.7–4.0)
MCH: 31.5 pg (ref 26.0–34.0)
MCHC: 33.8 g/dL (ref 30.0–36.0)
MCV: 93.1 fL (ref 80.0–100.0)
Monocytes Absolute: 0.2 10*3/uL (ref 0.1–1.0)
Monocytes Relative: 10 %
Neutro Abs: 1.2 10*3/uL — ABNORMAL LOW (ref 1.7–7.7)
Neutrophils Relative %: 47 %
Platelets: 118 10*3/uL — ABNORMAL LOW (ref 150–400)
RBC: 3.75 MIL/uL — ABNORMAL LOW (ref 4.22–5.81)
RDW: 16.2 % — ABNORMAL HIGH (ref 11.5–15.5)
WBC: 2.4 10*3/uL — ABNORMAL LOW (ref 4.0–10.5)
nRBC: 0 % (ref 0.0–0.2)

## 2023-12-24 LAB — MAGNESIUM: Magnesium: 1.9 mg/dL (ref 1.7–2.4)

## 2023-12-24 LAB — PSA: Prostatic Specific Antigen: 0.54 ng/mL (ref 0.00–4.00)

## 2023-12-24 MED ORDER — HEPARIN SOD (PORK) LOCK FLUSH 100 UNIT/ML IV SOLN
500.0000 [IU] | Freq: Once | INTRAVENOUS | Status: AC
Start: 2023-12-24 — End: 2023-12-24
  Administered 2023-12-24: 500 [IU] via INTRAVENOUS

## 2023-12-24 MED ORDER — SODIUM CHLORIDE 0.9% FLUSH
10.0000 mL | Freq: Once | INTRAVENOUS | Status: AC
Start: 2023-12-24 — End: 2023-12-24
  Administered 2023-12-24: 10 mL via INTRAVENOUS

## 2023-12-24 MED ORDER — DENOSUMAB 120 MG/1.7ML ~~LOC~~ SOLN
120.0000 mg | Freq: Once | SUBCUTANEOUS | Status: AC
  Administered 2023-12-24: 120 mg via SUBCUTANEOUS
  Filled 2023-12-24: qty 1.7

## 2023-12-24 NOTE — Patient Instructions (Signed)
 [facility] [place] - A DEPT OF [facility]  Discharge Instructions: Thank you for choosing [facility] to provide your oncology and hematology care.  If you have a lab appointment with the Cancer Center - please note that after April 8th, 2024, all labs will be drawn in the cancer center.  You do not have to check in or register with the main entrance as you have in the past but will complete your check-in in the cancer center.  Wear comfortable clothing and clothing appropriate for easy access to any Portacath or PICC line.   We strive to give you quality time with your provider. You may need to reschedule your appointment if you arrive late (15 or more minutes).  Arriving late affects you and other patients whose appointments are after yours.  Also, if you miss three or more appointments without notifying the office, you may be dismissed from the clinic at the provider's discretion.      For prescription refill requests, have your pharmacy contact our office and allow 72 hours for refills to be completed.    Today you received the following: PF labs and xgeva  injection   To help prevent nausea and vomiting after your treatment, we encourage you to take your nausea medication as directed.  BELOW ARE SYMPTOMS THAT SHOULD BE REPORTED IMMEDIATELY: *FEVER GREATER THAN 100.4 F (38 C) OR HIGHER *CHILLS OR SWEATING *NAUSEA AND VOMITING THAT IS NOT CONTROLLED WITH YOUR NAUSEA MEDICATION *UNUSUAL SHORTNESS OF BREATH *UNUSUAL BRUISING OR BLEEDING *URINARY PROBLEMS (pain or burning when urinating, or frequent urination) *BOWEL PROBLEMS (unusual diarrhea, constipation, pain near the anus) TENDERNESS IN MOUTH AND THROAT WITH OR WITHOUT PRESENCE OF ULCERS (sore throat, sores in mouth, or a toothache) UNUSUAL RASH, SWELLING OR PAIN  UNUSUAL VAGINAL DISCHARGE OR ITCHING   Items with * indicate a potential emergency and should be followed up as soon as possible or  go to the Emergency Department if any problems should occur.  Please show the CHEMOTHERAPY ALERT CARD or IMMUNOTHERAPY ALERT CARD at check-in to the Emergency Department and triage nurse.  Should you have questions after your visit or need to cancel or reschedule your appointment, please contact [facility] [place] - A DEPT OF [facility] [phone]  and follow the prompts.  Office hours are 8:00 a.m. to 4:30 p.m. Monday - Friday. Please note that voicemails left after 4:00 p.m. may not be returned until the following business day.  We are closed weekends and major holidays. You have access to a nurse at all times for urgent questions. Please call the main number to the clinic [phone] and follow the prompts.  For any non-urgent questions, you may also contact your provider using MyChart. We now offer e-Visits for anyone 47 and older to request care online for non-urgent symptoms. For details visit [email or web address].   Also download the MyChart app! Go to the app store, search MyChart, open the app, select [place], and log in with your MyChart username and password.

## 2023-12-24 NOTE — Telephone Encounter (Signed)
 Pt is at the [facility] and they did an EKG which showed he was in afib

## 2023-12-24 NOTE — Telephone Encounter (Signed)
 Patient stated that he did not have an EKG completed today in the cancer center- he was in a fib per his smart watch. Pt stated he will keep his appt with provider on 1/3.

## 2023-12-24 NOTE — Progress Notes (Signed)
 Patient in clinic today for labs and xgeva . Heart rate noted to be irregular. Patient stated this watch has been alerting him while he was sleeping at night. Patient states its been happening for almost two weeks. Manual heart rate was 67 MD made aware and instructed patient to call his cardiologist.   Patient called cardiologist office while here and was able to set up appt for tomorrow.   Xgeva  injection given per orders. Patient tolerated it well without problems. Vitals stable and discharged home from clinic ambulatory. Follow up as scheduled.

## 2023-12-25 ENCOUNTER — Ambulatory Visit: Payer: Medicare HMO | Attending: Nurse Practitioner

## 2023-12-25 ENCOUNTER — Ambulatory Visit: Payer: Medicare HMO | Attending: Nurse Practitioner | Admitting: Nurse Practitioner

## 2023-12-25 ENCOUNTER — Telehealth: Payer: Self-pay | Admitting: Nurse Practitioner

## 2023-12-25 VITALS — BP 136/88 | HR 75 | Ht 74.0 in | Wt 193.0 lb

## 2023-12-25 DIAGNOSIS — E782 Mixed hyperlipidemia: Secondary | ICD-10-CM

## 2023-12-25 DIAGNOSIS — Z77090 Contact with and (suspected) exposure to asbestos: Secondary | ICD-10-CM | POA: Diagnosis not present

## 2023-12-25 DIAGNOSIS — C9000 Multiple myeloma not having achieved remission: Secondary | ICD-10-CM | POA: Diagnosis not present

## 2023-12-25 DIAGNOSIS — I498 Other specified cardiac arrhythmias: Secondary | ICD-10-CM | POA: Diagnosis not present

## 2023-12-25 DIAGNOSIS — R0789 Other chest pain: Secondary | ICD-10-CM

## 2023-12-25 DIAGNOSIS — I499 Cardiac arrhythmia, unspecified: Secondary | ICD-10-CM | POA: Diagnosis not present

## 2023-12-25 DIAGNOSIS — I219 Acute myocardial infarction, unspecified: Secondary | ICD-10-CM | POA: Diagnosis not present

## 2023-12-25 DIAGNOSIS — Z8546 Personal history of malignant neoplasm of prostate: Secondary | ICD-10-CM | POA: Diagnosis not present

## 2023-12-25 DIAGNOSIS — E785 Hyperlipidemia, unspecified: Secondary | ICD-10-CM

## 2023-12-25 LAB — KAPPA/LAMBDA LIGHT CHAINS
Kappa free light chain: 14 mg/L (ref 3.3–19.4)
Kappa, lambda light chain ratio: 1.69 — ABNORMAL HIGH (ref 0.26–1.65)
Lambda free light chains: 8.3 mg/L (ref 5.7–26.3)

## 2023-12-25 NOTE — Progress Notes (Signed)
 Cardiology Office Note:    Date: 12/25/2023 ID:  Luis Stewart, DOB 06/05/1951, MRN [id number] PCP:  [name], MD  [facility] Providers Cardiologist:  Jayson Sierras, MD    Referring MD: [name], MD   CC: Here for A-fib evaluation  History of Present Illness:    Luis Stewart is a 73 y.o. male with a PMH of atypical chest pain, coronary artery calcification, HLD, hx of PE, hx of GIB while on Xarelto , hx of asbestos exposure (follows Pulmonology), hx of prostate cancer, Multiple Myeloma (Dx in March 2023, follows Hem/Onc), and GERD, who presnts today for A-fib evaluation.   Last seen by Dr. Sierras on June 03, 2023. Was overall doing well from a cardiac perspective. Was being considered for possible stem cell transplantation.   He presents today for possible A-fib evaluation. He says he is here because his Smart Watch device has alerted him to irregular heart beats. Shows me his Smart Watch that reveals recent HR ranging from 33 - 180 with average HR at 58 bpm. Telephone encounter on 12/24/2023 shows that pt was at [facility] and an EKG showed A-fib, however later on this was clarified that this was noted via his Smart Watch. Denies any palpitations. Recent EKG he has performed at home reveals SR/SA that I have reviewed. Admits to chronic atypical chest pain, pt believes this is indigestion related and describes it as an intermittent ache/dull, stable over time. Says Protonix  and having a BM help relieve this. Denies any aggravating factors, rates pain as 4 or 5 on 0-10 pains scale. Denies any shortness of breath, palpitations, syncope, presyncope, dizziness, orthopnea, PND, swelling or significant weight changes, acute bleeding, or claudication.  Please see the history of present illness.    All other systems reviewed and are negative.  EKGs/Labs/Other Studies Reviewed:    The following studies were reviewed today:   EKG:   EKG  Interpretation Date/Time:  Friday December 25 2023 15:39:10 EST Ventricular Rate:  75 PR Interval:  196 QRS Duration:  92 QT Interval:  416 QTC Calculation: 464 R Axis:   65  Text Interpretation: Sinus rhythm with Fusion complexes Incomplete right bundle branch block When compared with ECG of 30-Nov-2022 09:10, PREVIOUS ECG IS PRESENT Confirmed by Miriam Norris [phone]) on 12/28/2023 10:50:15 AM   2D Echocardiogram on November 14, 2022 ([facility]):   LVEF 55 to 60%, no RWMA, mild MR, mild TR, all other findings normal.  Coronary CTA on October 29, 2018: 1.  Coronary artery calcium  score 0. 2.  No significant disease noted in coronary arteries. 3.  No acute or clinically significant findings in the extracardiac anatomy.   Myoview  on November 04, 2017: There was no ST segment deviation noted during stress. The study is normal. No myocardial ischemia or scar. This is a low risk study. Nuclear stress EF: 67%.  Vascular ultrasound lower extremity Doppler on July 17, 2017: No evidence acute or chronic DVT within either lower extremity with special attention paid to the right posterior tibial and peroneal veins.  Vascular ultrasound lower extremity Doppler on June 18, 2017: Occlusive thrombus within the right calf posterior tibial and peroneal veins.  Myoview  on October 05, 2015: No diagnostic ST segment changes by standard criteria. Adequate heart rate response noted with sinus rhythm throughout. Ectopic atrial rhythm noted at the end of recovery. Blood pressure demonstrated a hypertensive response to exercise. Perfusion imaging is most consistent with attenuation artifact in the inferolateral wall on  rest imaging. No clear evidence of scar or ischemia. This is a low risk study. Nuclear stress EF: 67%.  Risk Assessment/Calculations:    The 10-year ASCVD risk score (Arnett DK, et al., 2019) is: 13.3%   Values used to calculate the score:     Age: 55 years     Sex: Male     Is  Non-Hispanic African American: Yes     Diabetic: No     Tobacco smoker: No     Systolic Blood Pressure: 136 mmHg     Is BP treated: No     HDL Cholesterol: 61 mg/dL     Total Cholesterol: 187 mg/dL  Physical Exam:    VS:  BP 136/88 (BP Location: Left Arm)   Pulse 75   Ht 6' 2 (1.88 m)   Wt 193 lb (87.5 kg)   SpO2 96%   BMI 24.78 kg/m     Wt Readings from Last 3 Encounters:  12/25/23 193 lb (87.5 kg)  11/03/23 200 lb 12.8 oz (91.1 kg)  10/01/23 200 lb 12.8 oz (91.1 kg)    GEN: Well nourished, well developed in no acute distress HEENT: Normal NECK: No JVD; No carotid bruits CARDIAC: S1/S2, RRR, no murmurs, rubs, gallops; 2+ peripheral pulses throughout, strong and equal bilaterally RESPIRATORY:  Clear and diminished to auscultation without rales, wheezing or rhonchi  MUSCULOSKELETAL:  No edema; No deformity  SKIN: Warm and dry NEUROLOGIC:  Alert and oriented x 3 PSYCHIATRIC:  Normal affect   ASSESSMENT & PLAN:    In order of problems listed above:  History of atypical chest pain Coronary CTA in 2019 revealed coronary artery calcium  score of 0.  Admits to very atypical symptoms, does not sound cardiac related. No indication for ischemic evaluation.  Continue current medication regimen. Heart healthy diet and regular cardiovascular exercise encouraged. Continue to follow with PCP. Care and ED precautions discussed.   2. Arrhythmia  Admits to recent irregular heart beats noted on his Smart Watch, however I do not see images or signs/symptoms for A-fib. SR on exam today. Will arrange 2 week Zio XT monitor for further evaluation. Continue to follow with PCP. Care and ED precautions discussed.   Hyperlipidemia Labs 05/2023 revealed LDL 112. Continue Crestor . Not addressed, but plan to arrange labs at next office visit. Heart healthy diet and regular cardiovascular exercise encouraged. Continue to follow with PCP.  Asbestos exposure Past history of exposure to asbestos.   Continue to follow-up with pulmonology and PCP.  4. Multiple myeloma and history of prostate cancer Continue to follow-up with oncology and urology.     5.  Disposition: Follow-up with Dr. Debera or APP in 6-8 weeks or sooner if anything changes.  Medication Adjustments/Labs and Tests Ordered: Current medicines are reviewed at length with the patient today.  Concerns regarding medicines are outlined above.  Orders Placed This Encounter  Procedures   EKG 12-Lead   No orders of the defined types were placed in this encounter.   Patient Instructions  Medication Instructions:  Your physician recommends that you continue on your current medications as directed. Please refer to the Current Medication list given to you today.  Labwork: None   Testing/Procedures: ZIO- Long Term Monitor Instructions   Your physician has requested you wear your ZIO patch monitor 14 days.   This is a single patch monitor.  [employer] supplies one patch monitor per enrollment.  Additional stickers are not available.   Please do not apply patch if you  will be having a Nuclear Stress Test, Echocardiogram, Cardiac CT, MRI, or Chest Xray during the time frame you would be wearing the monitor. The patch cannot be worn during these tests.  You cannot remove and re-apply the ZIO XT patch monitor.   Your ZIO patch monitor will be sent USPS Priority mail from [facility] directly to your home address. The monitor may also be mailed to a PO BOX if home delivery is not available.   It may take 3-5 days to receive your monitor after you have been enrolled.   Once you have received you monitor, please review enclosed instructions.  Your monitor has already been registered assigning a specific monitor serial # to you.   Applying the monitor   Shave hair from upper left chest.   Hold abrader disc by orange tab.  Rub abrader in 40 strokes over left upper chest as indicated in your monitor instructions.   Clean  area with 4 enclosed alcohol pads .  Use all pads to assure are is cleaned thoroughly.  Let dry.   Apply patch as indicated in monitor instructions.  Patch will be place under collarbone on left side of chest with arrow pointing upward.   Rub patch adhesive wings for 2 minutes.Remove white label marked 1.  Remove white label marked 2.  Rub patch adhesive wings for 2 additional minutes.   While looking in a mirror, press and release button in center of patch.  A small green light will flash 3-4 times .  This will be your only indicator the monitor has been turned on.     Do not shower for the first 24 hours.  You may shower after the first 24 hours.   Press button if you feel a symptom. You will hear a small click.  Record Date, Time and Symptom in the Patient Log Book.   When you are ready to remove patch, follow instructions on last 2 pages of Patient Log Book.  Stick patch monitor onto last page of Patient Log Book.   Place Patient Log Book in [place] box.  Use locking tab on box and tape box closed securely.  The [employer] and [employer] has [employer] on it.  Please place in mailbox as soon as possible.  Your physician should have your test results approximately 7 days after the monitor has been mailed back to [facility].   Call [facility] Customer Care at [phone] if you have questions regarding your ZIO XT patch monitor.  Call them immediately if you see an orange light blinking on your monitor.   If your monitor falls off in less than 4 days contact our Monitor department at [phone].  If your monitor becomes loose or falls off after 4 days call [employer] at [phone] for suggestions on securing your monitor.  Follow-Up: Your physician recommends that you schedule a follow-up appointment in: 6-8 weeks   Any Other Special Instructions Will Be Listed Below (If Applicable).  If you need a refill on your cardiac medications before your next appointment, please call  your pharmacy.   Signed, Almarie Crate, NP

## 2023-12-25 NOTE — Telephone Encounter (Signed)
 Checking percert on the following    2 week zio XT

## 2023-12-25 NOTE — Patient Instructions (Addendum)
Medication Instructions Your physician recommends that you continue on your current medications as directed. Please refer to the Current Medication list given to you today.  Labwork: None  Testing/Procedures:  ZIO- Long Term Monitor Instructions   Your physician has requested you wear your ZIO patch monitor 14 days.   This is a single patch monitor.  [employer] supplies one patch monitor per enrollment.  Additional stickers are not available.   Please do not apply patch if you will be having a Nuclear Stress Test, Echocardiogram, Cardiac CT, MRI, or Chest Xray during the time frame you would be wearing the monitor. The patch cannot be worn during these tests.  You cannot remove and re-apply the ZIO XT patch monitor.   Your ZIO patch monitor will be sent USPS Priority mail from [facility] directly to your home address. The monitor may also be mailed to a PO BOX if home delivery is not available.   It may take 3-5 days to receive your monitor after you have been enrolled.   Once you have received you monitor, please review enclosed instructions.  Your monitor has already been registered assigning a specific monitor serial # to you.   Applying the monitor   Shave hair from upper left chest.   Hold abrader disc by orange tab.  Rub abrader in 40 strokes over left upper chest as indicated in your monitor instructions.   Clean area with 4 enclosed alcohol pads .  Use all pads to assure are is cleaned thoroughly.  Let dry.   Apply patch as indicated in monitor instructions.  Patch will be place under collarbone on left side of chest with arrow pointing upward.   Rub patch adhesive wings for 2 minutes.Remove white label marked "1".  Remove white label marked "2".  Rub patch adhesive wings for 2 additional minutes.   While looking in a mirror, press and release button in center of patch.  A small green light will flash 3-4 times .  This will be your only indicator the monitor has been  turned on.     Do not shower for the first 24 hours.  You may shower after the first 24 hours.   Press button if you feel a symptom. You will hear a small click.  Record Date, Time and Symptom in the Patient Log Book.   When you are ready to remove patch, follow instructions on last 2 pages of Patient Log Book.  Stick patch monitor onto last page of Patient Log Book.   Place Patient Log Book in [place] box.  Use locking tab on box and tape box closed securely.  The [employer] has [employer] on it.  Please place in mailbox as soon as possible.  Your physician should have your test results approximately 7 days after the monitor has been mailed back to [facility].   Call [facility] Customer Care at [phone] if you have questions regarding your ZIO XT patch monitor.  Call them immediately if you see an orange light blinking on your monitor.   If your monitor falls off in less than 4 days contact our Monitor department at [phone].  If your monitor becomes loose or falls off after 4 days call [employer] at [phone] for suggestions on securing your monitor.  Follow-Up: Your physician recommends that you schedule a follow-up appointment in: 6-8 weeks  Any Other Special Instructions Will Be Listed Below (If Applicable).  If you need a refill on your cardiac medications  before your next appointment, please call your pharmacy

## 2023-12-28 MED ORDER — LENALIDOMIDE 15 MG PO CAPS: 15.0000 mg | ORAL_CAPSULE | Freq: Every day | ORAL | 0 refills | Status: DC

## 2023-12-28 NOTE — Addendum Note (Signed)
 Addended by: Remi Haggard on: 12/28/2023 12:00 PM   Modules accepted: Orders

## 2023-12-28 NOTE — Telephone Encounter (Signed)
 Received a fax from [facility] that this patient can no longer fill at [facility] due to insurance restrictions. They stated the patient would need to fill at [facility].    Patient's Revlimid  rx was redirected to [facility]. Patient aware rx was sent to [facility] and provided patient with their phone number.   Ben faxed over supportive information to [facility].

## 2023-12-29 ENCOUNTER — Telehealth: Payer: Self-pay

## 2023-12-29 LAB — PROTEIN ELECTROPHORESIS, SERUM
A/G Ratio: 1.8 — ABNORMAL HIGH (ref 0.7–1.7)
Albumin ELP: 3.5 g/dL (ref 2.9–4.4)
Alpha-1-Globulin: 0.2 g/dL (ref 0.0–0.4)
Alpha-2-Globulin: 0.6 g/dL (ref 0.4–1.0)
Beta Globulin: 0.8 g/dL (ref 0.7–1.3)
Gamma Globulin: 0.4 g/dL (ref 0.4–1.8)
Globulin, Total: 2 g/dL — ABNORMAL LOW (ref 2.2–3.9)
M-Spike, %: 0.2 g/dL — ABNORMAL HIGH
Total Protein ELP: 5.5 g/dL — ABNORMAL LOW (ref 6.0–8.5)

## 2023-12-29 NOTE — Telephone Encounter (Signed)
 Received notification that patient was required to fill Lenalidomide  at [facility] in 2025. Script and all supporting documents sent to [facility] for processing and fulfillment.    Morene Potters, CPhT Oncology Pharmacy Patient Advocate  [facility]  [phone] (phone) [phone] (fax) 12/29/2023 9:12 AM

## 2023-12-31 ENCOUNTER — Inpatient Hospital Stay (HOSPITAL_BASED_OUTPATIENT_CLINIC_OR_DEPARTMENT_OTHER): Payer: Medicare HMO | Admitting: Hematology

## 2023-12-31 VITALS — BP 143/81 | HR 61 | Temp 98.1°F | Resp 16 | Wt 197.1 lb

## 2023-12-31 DIAGNOSIS — C9000 Multiple myeloma not having achieved remission: Secondary | ICD-10-CM | POA: Diagnosis not present

## 2023-12-31 DIAGNOSIS — C7951 Secondary malignant neoplasm of bone: Secondary | ICD-10-CM | POA: Diagnosis not present

## 2023-12-31 LAB — IMMUNOFIXATION ELECTROPHORESIS
IgA: 30 mg/dL — ABNORMAL LOW (ref 61–437)
IgG (Immunoglobin G), Serum: 527 mg/dL — ABNORMAL LOW (ref 603–1613)
IgM (Immunoglobulin M), Srm: 16 mg/dL (ref 15–143)
Total Protein ELP: 5.4 g/dL — ABNORMAL LOW (ref 6.0–8.5)

## 2023-12-31 NOTE — Progress Notes (Signed)
Patient is taking Revlimid as prescribed.  He has not missed any doses and reports no side effects at this time.   

## 2023-12-31 NOTE — Patient Instructions (Addendum)
 [facility] at [facility] Discharge Instructions   You were seen and examined today by Dr. Ellin Saba.  He reviewed the results of your lab work which are normal/stable.   Continue Revlimid as prescribed.   Return as scheduled.    Thank you for choosing [facility] at [facility] to provide your oncology and hematology care.  To afford each patient quality time with our provider, please arrive at least 15 minutes before your scheduled appointment time.   If you have a lab appointment with the Cancer Center please come in thru the Main Entrance and check in at the main information desk.  You need to re-schedule your appointment should you arrive 10 or more minutes late.  We strive to give you quality time with our providers, and arriving late affects you and other patients whose appointments are after yours.  Also, if you no show three or more times for appointments you may be dismissed from the clinic at the providers discretion.     Again, thank you for choosing [facility].  Our hope is that these requests will decrease the amount of time that you wait before being seen by our physicians.       _____________________________________________________________  Should you have questions after your visit to [facility], please contact our office at [phone] and follow the prompts.  Our office hours are 8:00 a.m. and 4:30 p.m. Monday - Friday.  Please note that voicemails left after 4:00 p.m. may not be returned until the following business day.  We are closed weekends and major holidays.  You do have access to a nurse 24-7, just call the main number to the clinic [phone] and do not press any options, hold on the line and a nurse will answer the phone.    For prescription refill requests, have your pharmacy contact our office and allow 72 hours.    Due to Covid, you will need to wear a mask upon entering the  hospital. If you do not have a mask, a mask will be given to you at the Main Entrance upon arrival. For doctor visits, patients may have 1 support person age 10 or older with them. For treatment visits, patients can not have anyone with them due to social distancing guidelines and our immunocompromised population.

## 2023-12-31 NOTE — Progress Notes (Signed)
 [facility] [address], [place] [address]    Clinic Day:  12/31/23   Referring physician: [name], MD  Patient Care Team: Luis Suzzane POUR, MD as PCP - General (Internal Medicine) Luis Jayson MATSU, MD as PCP - Cardiology (Cardiology) Luis [name], MD as Consulting Physician (Gastroenterology) Luis Hai, MD as Medical Oncologist (Medical Oncology) Luis Joesph SQUIBB, RN as Oncology Nurse Navigator (Medical Oncology) Luis Suzen CROME, RN as [facility] Care Management   ASSESSMENT & PLAN:   Assessment: 1. Prostate cancer: - TRUS/Bx on 06/14/2015: 1/12 cores positive for adenocarcinoma-10% of the core in the right lateral base revealed Gleason 3+3=6.  Prostate volume 175 cc.  PSA was 16.6.  PSA density 0.09. - Seen by Dr. Cam in [facility] for robotic prostatectomy.  Due to low-volume prostate cancer and very low PSA density, it was recommended that he continue active surveillance. - 01/21/2016: Surveillance TRUS/biopsy, following a prostatic MRI which revealed no suspicious prostatic lesions and correlated the patient's large prostate.  All 12 cores were negative for prostate cancer.  Prostate volume was 190 mL. - 08/11/2018: Fusion TRUS/biopsy: Recent prostate MRI-volume 220 mL, no significant lesions.  No evidence of transcapsular/.  Ureteral, SV/bony or lymphatic disease.  Path-1 core (left apex) revealed Gleason 3+3 and 5% of core. - PSA: 17.16 (10/09/2015), 15.4 (11/04/2016), 17.9 (05/15/2017), 18.4 (04/19/2018), 15.9 (02/15/2019), 15.5 (03/20/2020), 22.4 (03/20/2021) - MRI of the lumbar spine with and without contrast on 11/27/2021 done for back pain showed expanded appearance of the sacrum with diffusely abnormal bone marrow signal and contrast-enhancement concerning for metastatic disease. - CT pelvis with contrast on 11/27/2021: Expansile, heterogeneous trabeculated appearance of the sacrum with thickening of the cortex,  unchanged from multiple prior exams.  No evidence of pelvic lymphadenopathy.  Extreme prostatomegaly. -PSMA PET scan (03/13/2022): Markedly enlarged prostate gland with focal activity inferior left aspect of the gland suggesting prostatic adenocarcinoma.  No nodal metastasis in the abdomen or pelvis.  Multifocal skeletal metastasis in the mid sacrum, right sacral ala, subtle lesions in the L2 and several rib lesions. - Degarelix  started on 04/15/2022   2. Social/family history: - Lives at home with his wife.  Retired in 2003 from [employer].  He works part-time work at an media planner.  Non-smoker. - Sister had cholangiocarcinoma.  Paternal first cousin had metastatic cancer.  Paternal uncle had prostate cancer.  3.  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  Core biopsy demonstrates hypercellular marrow (95%) with large atypical plasma cells arranged in sheets, by CD138 IHC plasma cells comprise 70% of the cellular elements, kappa restricted.  Amyloid was negative. - Chromosome analysis: 74, XY, del(20)(q11.2q13.1) [5]/46,XY[17] - Myeloma FISH panel: Tetrasomies1, 4, 11, 16 and 20, deletion of 13 q., gain of 14 q./rearrangement of IgH gene - PET CT scan (04/03/2022): No sign of tracer avid lesions of myeloma.  Chronic progressive changes involving the sacrum, left iliac bone including accentuation of trabecular markings and cortical thickening with low FDG uptake suggestive of chronic Paget's disease.  Marked prostate gland enlargement. - BMBX (03/20/2022):Hypercellular bone marrow with at least 25% plasma cells on aspirate with atypical features.  Core biopsy demonstrates hypercellular marrow for age (95%) with large atypical plasma cells arranged in sheets.  By CD138 IHC, plasma cells comprise approximately 70% of cellular elements and are kappa restricted.  Amyloid deposition was  negative  by Congo red.  Karyotype showed 20 q. deletion.  Myeloma FISH panel showed complex abnormalities. - Dara KRD started on 04/15/2022, 8 cycles completed. - Stem cell collection was completed at [facility].  Patient decided to do transplant at first relapse.    Plan: 1. 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  Last Eligard  was on 10/01/2023. - PSA improved to 0.54.  2.  IgG kappa multiple myeloma with complex cytogenetics: - He is tolerating Revlimid  reasonably well.  He has occasional diarrhea which is better on off weeks. - Reviewed myeloma labs from 12/24/2023: M spike is 0.2 g, slightly up from 0.1 g previously.  Free light chain ratio improved to 1.69 from 1.84.  Kappa light chains are normal at 14.  CBC shows mild leukopenia and thrombocytopenia from Revlimid . - Continue Revlimid  15 mg 3 weeks on/1 week off.  Will repeat myeloma labs in 12 weeks.  If it comes down to baseline of 0.1 mg or lower, will consider switching Revlimid  to 10 mg maintenance dose 3 weeks on 1 week off.  3.  Normocytic anemia: - He is taking iron tablet 5 to 6 days/week. - He had colonoscopy on 09/25/2023: Tubular adenoma at hepatic flexure was removed. - Latest CBC on 12/24/2023 shows hemoglobin 11.8 and ferritin of 51.  4.  Osteopenia (DEXA scan 06/04/2022: T score -2.1): - Calcium  is 8.4 with albumin 3.4.  Continue denosumab  today and monthly.    Orders Placed This Encounter  Procedures   Kappa/lambda light chains    Standing Status:   Standing    Number of Occurrences:   10    Expiration Date:   12/30/2024   Immunofixation electrophoresis    Standing Status:   Standing    Number of Occurrences:   10    Expiration Date:   12/30/2024   Protein electrophoresis, serum    Standing Status:   Standing    Number of Occurrences:   10    Expiration Date:   12/30/2024      Luis Stewart,acting as a scribe for Luis Stands, MD.,have documented all  relevant documentation on the behalf of Luis Stands, MD,as directed by  Luis Stands, MD while in the presence of Luis Stands, MD.  I, Luis Stands MD, have reviewed the above documentation for accuracy and completeness, and I agree with the above.      Luis Stands, MD   1/9/20256:00 PM  CHIEF COMPLAINT:   Diagnosis: prostate cancer and multiple myeloma    Cancer Staging  Multiple myeloma (HCC) Staging form: Plasma Cell Myeloma and Plasma Cell Disorders, AJCC 8th Edition - Clinical: No stage assigned - Unsigned    Prior Therapy: Carfilzomib  (20/70) + Daratumumab  SQ + Dexamethasone  (20/40) DaraKRd q28d   Current Therapy:  Eligard ; Revlimid  15 mg 3 weeks on/1 week of    HISTORY OF PRESENT ILLNESS:   Oncology History  Multiple myeloma (HCC)  04/09/2022 Initial Diagnosis   Multiple myeloma (HCC)   04/15/2022 - 11/11/2022 Chemotherapy   Patient is on Treatment Plan : MYELOMA RELAPSED/REFRACTORY Carfilzomib  (20/70) + Daratumumab  SQ + Dexamethasone  (20/40) DaraKd q28d        INTERVAL HISTORY:   Luis Stewart is a 73 y.o. male presenting to clinic today for follow up of prostate cancer and multiple myeloma. He was last seen by me on 10/01/23.  Since his last visit, he had a colonoscopy on 10/05/23 with Dr. Shaaron.   Today, he states that he  is doing well overall. His appetite level is at 75%. His energy level is at 75%. He is accompanied by his wife. He saw cardiology on 12/25/23. He wore a heart monitor for a week and was told his heart function is fine. He takes oral iron 5-6 x a week. He notes alternating diarrhea and hard stools, attributing his diarrhea to Revlimid  as diarrhea is improved during his off weeks. Diarrhea waxes and wanes in intensity, which he occasionally treats with Imodium. He reports intermittent hot flashes. He denies any recent infections and jaw or dental pain.   PAST MEDICAL HISTORY:   Past Medical History: Past Medical  History:  Diagnosis Date   Arthritis    Asbestosis (HCC)    Closed fracture of distal end of fibula with tibia with routine healing 05/28/2020   COVID-19 09/03/2021   Cyst of kidney, acquired    Cystic disease of liver    Enlarged prostate    Headache    History of fracture of leg    Right, childhood   Hx of migraine headaches    Hyperlipidemia    Multiple myeloma (HCC)    Neuropathy    PE (pulmonary thromboembolism) (HCC)    After knee surgery   Prostate cancer (HCC) 2015   Adenocarcinoma by biopsy    Surgical History: Past Surgical History:  Procedure Laterality Date   BIOPSY PROSTATE  2003 and 2005   COLONOSCOPY  11/28/2008   Dr. Rourk:internal hemorrhoids/tortus colon/ascending colon polyps, tubular adenomas   COLONOSCOPY N/A 09/04/2014   Procedure: COLONOSCOPY;  Surgeon: Lamar CHRISTELLA Hollingshead, MD;  [facility];  Service: Endoscopy;  Laterality: N/A;  9:30   COLONOSCOPY N/A 07/17/2017   Procedure: COLONOSCOPY;  Surgeon: Harvey Margo CROME, MD;  [facility];  Service: Endoscopy;  Laterality: N/A;   COLONOSCOPY WITH PROPOFOL  N/A 10/05/2023   Procedure: COLONOSCOPY WITH PROPOFOL ;  Surgeon: Hollingshead Lamar CHRISTELLA, MD;  [facility];  Service: Endoscopy;  Laterality: N/A;  10:00AM, ASA 3   ESOPHAGOGASTRODUODENOSCOPY N/A 07/16/2017   Procedure: ESOPHAGOGASTRODUODENOSCOPY (EGD);  Surgeon: Harvey Margo CROME, MD;  [facility];  Service: Endoscopy;  Laterality: N/A;   GIVENS CAPSULE STUDY  07/16/2017   Procedure: GIVENS CAPSULE STUDY;  Surgeon: Harvey Margo CROME, MD;  [facility];  Service: Endoscopy;;   JOINT REPLACEMENT N/A    Phreesia 01/12/2021   POLYPECTOMY  07/17/2017   Procedure: POLYPECTOMY;  Surgeon: Harvey Margo CROME, MD;  [facility];  Service: Endoscopy;;  cecal   POLYPECTOMY  10/05/2023   Procedure: POLYPECTOMY;  Surgeon: Hollingshead Lamar CHRISTELLA, MD;  [facility];  Service: Endoscopy;;   PORTACATH PLACEMENT Left  04/18/2022   Procedure: INSERTION PORT-A-CATH;  Surgeon: Mavis Anes, MD;  Location: [facility];  Service: General;  Laterality: Left;   TOTAL KNEE ARTHROPLASTY Right 06/10/2017   TOTAL KNEE ARTHROPLASTY Right 06/10/2017   Procedure: TOTAL KNEE ARTHROPLASTY;  Surgeon: Beverley Toribio FALCON, MD;  [facility];  Service: Orthopedics;  Laterality: Right;    Social History: Social History   Socioeconomic History   Marital status: Married    Spouse name: Idell caldron   Number of children: Not on file   Years of education: Not on file   Highest education level: Not on file  Occupational History   Occupation: Retired  Tobacco Use   Smoking status: Never   Smokeless tobacco: Never  Vaping Use   Vaping status: Never Used  Substance and Sexual  Activity   Alcohol use: No   Drug use: No   Sexual activity: Yes  Other Topics Concern   Not on file  Social History Narrative   Previously worked at [employer]   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   Social Drivers of Corporate Investment Banker Strain: Low Risk  (12/10/2023)   Overall Financial Resource Strain (CARDIA)    Difficulty of Paying Living Expenses: Not hard at all  Food Insecurity: No Food Insecurity (12/10/2023)   Hunger Vital Sign    Worried About Running Out of Food in the Last Year: Never true    Ran Out of Food in the Last Year: Never true  Transportation Needs: No Transportation Needs (12/10/2023)   PRAPARE - Administrator, Civil Service (Medical): No    Lack of Transportation (Non-Medical): No  Physical Activity: Insufficiently Active (02/09/2023)   Exercise Vital Sign    Days of Exercise per Week: 5 days    Minutes of Exercise per Session: 20 min  Stress: No Stress Concern Present (06/01/2023)   [employer] of Occupational Health - Occupational Stress Questionnaire    Feeling of Stress : Not at all  Social Connections: Socially Integrated (06/01/2023)   Social Connection and  Isolation Panel [NHANES]    Frequency of Communication with Friends and Family: More than three times a week    Frequency of Social Gatherings with Friends and Family: More than three times a week    Attends Religious Services: More than 4 times per year    Active Member of [employer] or Organizations: Yes    Attends Engineer, Structural: More than 4 times per year    Marital Status: Married  Catering Manager Violence: Not At Risk (02/09/2023)   Humiliation, Afraid, Rape, and Kick questionnaire    Fear of Current or Ex-Partner: No    Emotionally Abused: No    Physically Abused: No    Sexually Abused: No    Family History: Family History  Problem Relation Age of Onset   Diabetes Mother    Hypertension Mother    Obesity Mother    Heart disease Mother    Mental illness Mother    Hypertension Sister    Obesity Sister    Arthritis Sister    Deep vein thrombosis Father    Dementia Father    Heart disease Father        CABG   Colon cancer Neg Hx     Current Medications:  Current Outpatient Medications:    acetaminophen  (TYLENOL ) 500 MG tablet, Take 1,000 mg by mouth every 6 (six) hours as needed for mild pain or moderate pain., Disp: , Rfl:    acyclovir  (ZOVIRAX ) 400 MG tablet, TAKE 1 TABLET BY MOUTH TWICE A DAY, Disp: 180 tablet, Rfl: 2   Cholecalciferol (VITAMIN D ) 2000 units CAPS, Take 2,000 Units by mouth every other day., Disp: , Rfl:    DULoxetine  (CYMBALTA ) 30 MG capsule, TAKE 1 CAPSULE BY MOUTH EVERY DAY, Disp: 90 capsule, Rfl: 2   ferrous sulfate 325 (65 FE) MG EC tablet, Take 325 mg by mouth daily., Disp: , Rfl:    lenalidomide  (REVLIMID ) 15 MG capsule, Take 1 capsule (15 mg total) by mouth daily. Take for 21 days, then hold for 7 days. Repeat every 28 days., Disp: 21 capsule, Rfl: 0   Multiple Vitamin (MULTIVITAMIN WITH MINERALS) TABS tablet, Take 1 tablet by mouth daily with breakfast. Centrum  Silver for Men, Disp: , Rfl:    nitroGLYCERIN  (NITROSTAT ) 0.4 MG SL  tablet, Place 1 tablet (0.4 mg total) under the tongue every 5 (five) minutes x 3 doses as needed for chest pain (If no relief after 3rd dose, call or go to ED.)., Disp: 30 tablet, Rfl: 0   pantoprazole  (PROTONIX ) 40 MG tablet, TAKE 1 TABLET BY MOUTH EVERY OTHER DAY, Disp: 45 tablet, Rfl: 1   RESTASIS 0.05 % ophthalmic emulsion, Place 1 drop into both eyes 2 (two) times daily., Disp: , Rfl:    tamsulosin  (FLOMAX ) 0.4 MG CAPS capsule, Take 0.4 mg by mouth every other day., Disp: , Rfl:    rosuvastatin  (CRESTOR ) 5 MG tablet, Take 1 tablet (5 mg total) by mouth daily., Disp: 90 tablet, Rfl: 3   Allergies: Allergies  Allergen Reactions   Sulfa Antibiotics Other (See Comments)    Unknown reaction. Childhood reaction    REVIEW OF SYSTEMS:   Review of Systems  Constitutional:  Negative for chills, fatigue and fever.  HENT:   Negative for lump/mass, mouth sores, nosebleeds, sore throat and trouble swallowing.   Eyes:  Negative for eye problems.  Respiratory:  Negative for cough and shortness of breath.   Cardiovascular:  Negative for chest pain, leg swelling and palpitations.  Gastrointestinal:  Positive for constipation and diarrhea. Negative for abdominal pain, nausea and vomiting.  Endocrine: Positive for hot flashes.  Genitourinary:  Negative for bladder incontinence, difficulty urinating, dysuria, frequency, hematuria and nocturia.   Musculoskeletal:  Negative for arthralgias, back pain, flank pain, myalgias and neck pain.  Skin:  Negative for itching and rash.  Neurological:  Negative for dizziness, headaches and numbness.  Hematological:  Does not bruise/bleed easily.  Psychiatric/Behavioral:  Negative for depression, sleep disturbance and suicidal ideas. The patient is not nervous/anxious.   All other systems reviewed and are negative.    VITALS:   Blood pressure (!) 143/81, pulse 61, temperature 98.1 F (36.7 C), temperature source Oral, resp. rate 16, weight 197 lb 1.5 oz (89.4  kg), SpO2 96%.  Wt Readings from Last 3 Encounters:  12/31/23 197 lb 1.5 oz (89.4 kg)  12/25/23 193 lb (87.5 kg)  11/03/23 200 lb 12.8 oz (91.1 kg)    Body mass index is 25.31 kg/m.  Performance status (ECOG): 1 - Symptomatic but completely ambulatory  PHYSICAL EXAM:   Physical Exam Vitals and nursing note reviewed. Exam conducted with a chaperone present.  Constitutional:      Appearance: Normal appearance.  Cardiovascular:     Rate and Rhythm: Normal rate and regular rhythm.     Pulses: Normal pulses.     Heart sounds: Normal heart sounds.  Pulmonary:     Effort: Pulmonary effort is normal.     Breath sounds: Normal breath sounds.  Abdominal:     Palpations: Abdomen is soft. There is no hepatomegaly, splenomegaly or mass.     Tenderness: There is no abdominal tenderness.  Musculoskeletal:     Right lower leg: No edema.     Left lower leg: No edema.  Lymphadenopathy:     Cervical: No cervical adenopathy.     Right cervical: No superficial, deep or posterior cervical adenopathy.    Left cervical: No superficial, deep or posterior cervical adenopathy.     Upper Body:     Right upper body: No supraclavicular or axillary adenopathy.     Left upper body: No supraclavicular or axillary adenopathy.  Neurological:     General: No focal  deficit present.     Mental Status: He is alert and oriented to person, place, and time.  Psychiatric:        Mood and Affect: Mood normal.        Behavior: Behavior normal.     LABS:      Latest Ref Rng & Units 12/24/2023   11:11 AM 12/17/2023    2:19 PM 11/26/2023   10:36 AM  CBC  WBC 4.0 - 10.5 K/uL 2.4  2.7  2.9   Hemoglobin 13.0 - 17.0 g/dL 88.1  88.1  88.6   Hematocrit 39.0 - 52.0 % 34.9  35.3  34.4   Platelets 150 - 400 K/uL 118  138  131       Latest Ref Rng & Units 12/24/2023   11:11 AM 12/17/2023    2:19 PM 11/26/2023   10:36 AM  CMP  Glucose 70 - 99 mg/dL 898  97  99   BUN 8 - 23 mg/dL 19  16  14    Creatinine 0.61 - 1.24  mg/dL 8.95  9.02  8.91   Sodium 135 - 145 mmol/L 139  140  140   Potassium 3.5 - 5.1 mmol/L 3.4  3.8  3.6   Chloride 98 - 111 mmol/L 107  108  106   CO2 22 - 32 mmol/L 26  26  26    Calcium  8.9 - 10.3 mg/dL 8.4  8.5  8.5   Total Protein 6.5 - 8.1 g/dL 6.2  5.9  6.0   Total Bilirubin 0.0 - 1.2 mg/dL 0.7  0.8  0.6   Alkaline Phos 38 - 126 U/L 35  32  37   AST 15 - 41 U/L 21  21  23    ALT 0 - 44 U/L 24  21  27       No results found for: CEA1, CEA / No results found for: CEA1, CEA Lab Results  Component Value Date   PSA1 22.5 (H) 11/05/2021   No results found for: CAN199 No results found for: RJW874  Lab Results  Component Value Date   TOTALPROTELP 5.5 (L) 12/24/2023   TOTALPROTELP 5.4 (L) 12/24/2023   ALBUMINELP 3.5 12/24/2023   A1GS 0.2 12/24/2023   A2GS 0.6 12/24/2023   BETS 0.8 12/24/2023   GAMS 0.4 12/24/2023   MSPIKE 0.2 (H) 12/24/2023   SPEI Comment 12/24/2023   Lab Results  Component Value Date   TIBC 392 09/24/2023   TIBC 441 07/16/2023   TIBC 301 02/26/2022   FERRITIN 51 12/24/2023   FERRITIN 49 09/24/2023   FERRITIN 11 (L) 07/16/2023   IRONPCTSAT 15 (L) 09/24/2023   IRONPCTSAT 11 (L) 07/16/2023   IRONPCTSAT 42 (H) 02/26/2022   Lab Results  Component Value Date   LDH 142 10/21/2022   LDH 131 08/26/2022   LDH 124 07/29/2022     STUDIES:   No results found.

## 2024-01-09 DIAGNOSIS — Z008 Encounter for other general examination: Secondary | ICD-10-CM | POA: Diagnosis not present

## 2024-01-13 ENCOUNTER — Ambulatory Visit: Payer: Self-pay | Admitting: *Deleted

## 2024-01-13 NOTE — Patient Outreach (Signed)
  Care Coordination   Follow Up Visit Note   01/13/2024 Name: Luis Stewart MRN: [id number] DOB: 1951/03/31  Luis Stewart is a 73 y.o. year old male who sees [name], MD for primary care. I spoke with  [name] by phone today.  What matters to the patients health and wellness today?  Atrial fibrillation ?(new) increase heart rates during oncology visits and noted on his smart watch mainly at night while sleeping Heart rated in the 100-120's    Cardiac monitoring completed (Zio) and he has returned the monitor. To follow up results of the cardiac monitor with cardiology on 02/08/24 PMH pulmonary nodule, DVT, atypical chest pain, prediabetes   Since he has been on the chemo he has had hot flashes for about a year Fast or irregular heartbeats (palpitations), discomfort or pain in your chest, Shortness of breath, Sudden light-headedness or weakness, Tiring easily during exercise or activity, Syncope (fainting) not noted per patient He will check his smart watch if possible when notices hot flashes  Other than this he reports all is well     Goals Addressed             This Visit's Progress    Manage increased heart rates, hot flashes at home- care manager services   Not on track    Interventions Today    Flowsheet Row Most Recent Value  Chronic Disease   Chronic disease during today's visit Atrial Fibrillation (AFib), Other  [oncology]  General Interventions   General Interventions Discussed/Reviewed General Interventions Reviewed, Durable Medical Equipment (DME), Doctor Visits  Doctor Visits Discussed/Reviewed Doctor Visits Reviewed, Specialist, PCP  Durable Medical Equipment (DME) Other  [cardiac monitor]  PCP/Specialist Visits Compliance with follow-up visit  Exercise Interventions   Exercise Discussed/Reviewed Exercise Reviewed, Physical Activity  Physical Activity Discussed/Reviewed Physical Activity Discussed, Home  Exercise Program (HEP)  Education Interventions   Education Provided Provided Web-based Education  [atrial fibrillation & sweating symptom]  Provided Verbal Education On Other  Mental Health Interventions   Mental Health Discussed/Reviewed Mental Health Reviewed, Coping Strategies  Pharmacy Interventions   Pharmacy Dicussed/Reviewed Pharmacy Topics Reviewed, Affording Medications, Medications and their functions              SDOH assessments and interventions completed:  No     Care Coordination Interventions:  Yes, provided   Follow up plan: Follow up call scheduled for 04/12/24    Encounter Outcome:  Patient Visit Completed   Cala Bradford L. Noelle Penner, RN, BSN, CCM [place]  [facility], [facility] RN Care Manager Direct Dial: [phone]  Fax: [phone] Mailing Address: [address]  [place] [address] Website: [email or web address]

## 2024-01-13 NOTE — Patient Instructions (Addendum)
Visit Information  Thank you for taking time to visit with me today. Please don't hesitate to contact me if I can be of assistance to you.   Following are the goals we discussed today:   Goals Addressed             This Visit's Progress    Manage increased heart rates, hot flashes at home- care manager services   Not on track    Interventions Today    Flowsheet Row Most Recent Value  Chronic Disease   Chronic disease during today's visit Atrial Fibrillation (AFib), Other  [oncology]  General Interventions   General Interventions Discussed/Reviewed General Interventions Reviewed, Durable Medical Equipment (DME), Doctor Visits  Doctor Visits Discussed/Reviewed Doctor Visits Reviewed, Specialist, PCP  Durable Medical Equipment (DME) Other  [cardiac monitor]  PCP/Specialist Visits Compliance with follow-up visit  Exercise Interventions   Exercise Discussed/Reviewed Exercise Reviewed, Physical Activity  Physical Activity Discussed/Reviewed Physical Activity Discussed, Home Exercise Program (HEP)  Education Interventions   Education Provided Provided Web-based Education  [atrial fibrillation & sweating symptom]  Provided Verbal Education On Other  Mental Health Interventions   Mental Health Discussed/Reviewed Mental Health Reviewed, Coping Strategies  Pharmacy Interventions   Pharmacy Dicussed/Reviewed Pharmacy Topics Reviewed, Affording Medications, Medications and their functions              Our next appointment is by telephone on 04/12/24 at 1:45 pm  Please call the care guide team at [phone] if you need to cancel or reschedule your appointment.   If you are experiencing a Mental Health or Behavioral Health Crisis or need someone to talk to, please call the Suicide and Crisis Lifeline: 988 call the [place] National Suicide Prevention Lifeline: 7182741333 or TTY: (503)725-7382 TTY 9196777582) to talk to a trained counselor call 1-800-273-TALK (toll free, 24 hour  hotline) call the [facility]: [phone] call 911   Patient verbalizes understanding of instructions and care plan provided today and agrees to view in MyChart. Active MyChart status and patient understanding of how to access instructions and care plan via MyChart confirmed with patient.     The patient has been provided with contact information for the care management team and has been advised to call with any health related questions or concerns.   Larosa Rhines L. Noelle Penner, RN, BSN, CCM [facility], [facility] RN Care Manager Direct Dial: [phone]  Fax: [phone] Mailing Address: [address]  [place] [address] Website: [email or web address]

## 2024-01-15 ENCOUNTER — Other Ambulatory Visit: Payer: Self-pay | Admitting: Internal Medicine

## 2024-01-18 ENCOUNTER — Other Ambulatory Visit: Payer: Self-pay | Admitting: Nurse Practitioner

## 2024-01-18 DIAGNOSIS — I219 Acute myocardial infarction, unspecified: Secondary | ICD-10-CM

## 2024-01-21 ENCOUNTER — Other Ambulatory Visit: Payer: Self-pay | Admitting: Hematology

## 2024-01-21 ENCOUNTER — Inpatient Hospital Stay: Payer: Medicare HMO

## 2024-01-21 VITALS — BP 139/81 | HR 52 | Temp 96.8°F | Resp 17 | Wt 193.6 lb

## 2024-01-21 DIAGNOSIS — C7951 Secondary malignant neoplasm of bone: Secondary | ICD-10-CM | POA: Diagnosis not present

## 2024-01-21 DIAGNOSIS — C9000 Multiple myeloma not having achieved remission: Secondary | ICD-10-CM

## 2024-01-21 DIAGNOSIS — C61 Malignant neoplasm of prostate: Secondary | ICD-10-CM

## 2024-01-21 DIAGNOSIS — Z95828 Presence of other vascular implants and grafts: Secondary | ICD-10-CM

## 2024-01-21 LAB — COMPREHENSIVE METABOLIC PANEL
ALT: 21 U/L (ref 0–44)
AST: 22 U/L (ref 15–41)
Albumin: 3.6 g/dL (ref 3.5–5.0)
Alkaline Phosphatase: 34 U/L — ABNORMAL LOW (ref 38–126)
Anion gap: 7 (ref 5–15)
BUN: 16 mg/dL (ref 8–23)
CO2: 27 mmol/L (ref 22–32)
Calcium: 8.7 mg/dL — ABNORMAL LOW (ref 8.9–10.3)
Chloride: 105 mmol/L (ref 98–111)
Creatinine, Ser: 1.17 mg/dL (ref 0.61–1.24)
GFR, Estimated: >60 mL/min (ref >60)
Glucose, Bld: 87 mg/dL (ref 70–99)
Potassium: 3.7 mmol/L (ref 3.5–5.1)
Sodium: 139 mmol/L (ref 135–145)
Total Bilirubin: 0.9 mg/dL (ref 0.0–1.2)
Total Protein: 6.1 g/dL — ABNORMAL LOW (ref 6.5–8.1)

## 2024-01-21 MED ORDER — DENOSUMAB 120 MG/1.7ML ~~LOC~~ SOLN
120.0000 mg | Freq: Once | SUBCUTANEOUS | Status: AC
  Administered 2024-01-21: 120 mg via SUBCUTANEOUS
  Filled 2024-01-21: qty 1.7

## 2024-01-21 MED ORDER — HEPARIN SOD (PORK) LOCK FLUSH 100 UNIT/ML IV SOLN
500.0000 [IU] | Freq: Once | INTRAVENOUS | Status: AC
  Administered 2024-01-21: 500 [IU] via INTRAVENOUS

## 2024-01-21 MED ORDER — SODIUM CHLORIDE 0.9% FLUSH
10.0000 mL | INTRAVENOUS | Status: DC | PRN
  Administered 2024-01-21: 10 mL via INTRAVENOUS

## 2024-01-21 NOTE — Progress Notes (Signed)
Patients port flushed without difficulty.  Good blood return noted with no bruising or swelling noted at site.  Band aid applied.  VSS with discharge and left in satisfactory condition with no s/s of distress noted.

## 2024-01-21 NOTE — Progress Notes (Signed)
Xgeva injection given per orders. Patient tolerated it well without problems. Vitals stable and discharged home from clinic ambulatory. Follow up as scheduled.

## 2024-01-22 ENCOUNTER — Other Ambulatory Visit: Payer: Self-pay

## 2024-01-22 DIAGNOSIS — C9000 Multiple myeloma not having achieved remission: Secondary | ICD-10-CM

## 2024-01-22 MED ORDER — LENALIDOMIDE 15 MG PO CAPS: 15.0000 mg | ORAL_CAPSULE | Freq: Every day | ORAL | 0 refills | Status: DC

## 2024-01-22 NOTE — Telephone Encounter (Signed)
Chart reviewed. Revlimid refilled per last office note with Dr. Katragadda.  

## 2024-02-08 ENCOUNTER — Ambulatory Visit: Payer: Medicare HMO | Attending: Nurse Practitioner | Admitting: Nurse Practitioner

## 2024-02-08 ENCOUNTER — Encounter: Payer: Self-pay | Admitting: Nurse Practitioner

## 2024-02-08 VITALS — BP 118/68 | HR 44 | Ht 74.0 in | Wt 199.0 lb

## 2024-02-08 DIAGNOSIS — Z8546 Personal history of malignant neoplasm of prostate: Secondary | ICD-10-CM | POA: Diagnosis not present

## 2024-02-08 DIAGNOSIS — I471 Supraventricular tachycardia, unspecified: Secondary | ICD-10-CM

## 2024-02-08 DIAGNOSIS — I491 Atrial premature depolarization: Secondary | ICD-10-CM | POA: Diagnosis not present

## 2024-02-08 DIAGNOSIS — Z77090 Contact with and (suspected) exposure to asbestos: Secondary | ICD-10-CM

## 2024-02-08 DIAGNOSIS — R001 Bradycardia, unspecified: Secondary | ICD-10-CM | POA: Diagnosis not present

## 2024-02-08 DIAGNOSIS — Z79899 Other long term (current) drug therapy: Secondary | ICD-10-CM | POA: Diagnosis not present

## 2024-02-08 DIAGNOSIS — C9000 Multiple myeloma not having achieved remission: Secondary | ICD-10-CM | POA: Diagnosis not present

## 2024-02-08 DIAGNOSIS — E785 Hyperlipidemia, unspecified: Secondary | ICD-10-CM | POA: Diagnosis not present

## 2024-02-08 NOTE — Patient Instructions (Addendum)
Medication Instructions:   Continue all current medications.   Labwork:  FLP Reminder:  Nothing to eat or drink after 12 midnight prior to labs. Office will contact with results via phone, letter or mychart.     Testing/Procedures:  none  Follow-Up:  6 weeks    Any Other Special Instructions Will Be Listed Below (If Applicable).  You have been referred to:  EP   If you need a refill on your cardiac medications before your next appointment, please call your pharmacy.

## 2024-02-08 NOTE — Progress Notes (Signed)
Cardiology Office Note:    Date: 02/08/2024 ID:  Luis Stewart, DOB 11/30/1951, MRN [id number] PCP:  Luis Halon, MD  [facility] Providers Cardiologist:  Luis Dell, MD    Referring MD: Luis Halon, MD   CC: Here for follow-up  History of Present Illness:    Luis Stewart is a 73 y.o. male with a PMH of atypical chest pain, coronary artery calcification, HLD, hx of PE, hx of GIB while on Xarelto, hx of asbestos exposure (follows Pulmonology), hx of prostate cancer, Multiple Myeloma (Dx in March 2023, follows Hem/Onc), and GERD, who presnts today for scheduled follow-up.   Last seen by Dr. Diona Stewart on June 03, 2023. Was overall doing well from a cardiac perspective. Was being considered for possible stem cell transplantation.   12/25/2023 - He presents today for possible A-fib evaluation. He says he is here because his Smart Watch device has alerted him to irregular heart beats. Shows me his Smart Watch that reveals recent HR ranging from 33 - 180 with average HR at 58 bpm. Telephone encounter on 12/24/2023 shows that pt was at [facility] and an EKG showed A-fib, however later on this was clarified that this was noted via his Smart Watch. Denies any palpitations. Recent EKG he has performed at home reveals SR/SA that I have reviewed. Admits to chronic atypical chest pain, pt believes this is indigestion related and describes it as an intermittent ache/dull, stable over time. Says Protonix and having a BM help relieve this. Denies any aggravating factors, rates pain as 4 or 5 on 0-10 pains scale. Denies any shortness of breath, palpitations, syncope, presyncope, dizziness, orthopnea, PND, swelling or significant weight changes, acute bleeding, or claudication.  02/08/2024 - Presents today for follow-up. Continues to monitor his heart rate on his Smart Watch and has noticed some lower HR with HR averaging upper 30's to 90's. Denies any chest pain,  shortness of breath, palpitations, syncope, presyncope, dizziness, orthopnea, PND, swelling or significant weight changes, acute bleeding, or claudication.  Please see the history of present illness.    All other systems reviewed and are negative.  EKGs/Labs/Other Studies Reviewed:    The following studies were reviewed today:   EKG: EKG Interpretation Date/Time:  Monday February 08 2024 12:05:52 EST Ventricular Rate:  60 PR Interval:  230 QRS Duration:  96 QT Interval:  438 QTC Calculation: 438 R Axis:   64  Text Interpretation: Sinus rhythm with 1st degree A-V block with Premature supraventricular complexes Incomplete right bundle branch block When compared with ECG of 25-Dec-2023 15:39, Fusion complexes are no longer Present Premature supraventricular complexes are now Present PR interval has increased Confirmed by [name] [phone]) on 02/08/2024 12:07:41 PM   Cardiac monitor 12/2023:  ZIO monitor reviewed.  13 days, 23 hours analyzed.   Predominant rhythm is sinus with prolonged PR interval, heart rate ranging from 33 bpm up to 121 bpm and average heart rate 57 bpm. There were frequent PACs representing 10.2% total beats with otherwise occasional atrial couplets and triplets. There were rare PVCs including ventricular couplets representing less than 1% total beats.  Also limited ventricular bigeminy. Multiple (949) brief episodes of PSVT were noted, the longest of which was 10.6 seconds. Single episode of limited high degree heart block noted on January 6 at 12:21 PM, heart rate in the 30s and with junctional escape. No pauses.  2D Echocardiogram on November 14, 2022 ([facility]):   LVEF 55 to 60%, no RWMA, mild  MR, mild TR, all other findings normal.  Coronary CTA on October 29, 2018: 1.  Coronary artery calcium score 0. 2.  No significant disease noted in coronary arteries. 3.  No acute or clinically significant findings in the extracardiac anatomy.   Myoview on November 04, 2017: There was no ST segment deviation noted during stress. The study is normal. No myocardial ischemia or scar. This is a low risk study. Nuclear stress EF: 67%.  Vascular ultrasound lower extremity Doppler on July 17, 2017: No evidence acute or chronic DVT within either lower extremity with special attention paid to the right posterior tibial and peroneal veins.  Vascular ultrasound lower extremity Doppler on June 18, 2017: Occlusive thrombus within the right calf posterior tibial and peroneal veins.  Myoview on October 05, 2015: No diagnostic ST segment changes by standard criteria. Adequate heart rate response noted with sinus rhythm throughout. Ectopic atrial rhythm noted at the end of recovery. Blood pressure demonstrated a hypertensive response to exercise. Perfusion imaging is most consistent with attenuation artifact in the inferolateral wall on rest imaging. No clear evidence of scar or ischemia. This is a low risk study. Nuclear stress EF: 67%.   Physical Exam:    VS:  BP 118/68   Pulse (!) 44   Ht 6\' 2"  (1.88 m)   Wt 199 lb (90.3 kg)   SpO2 97%   BMI 25.55 kg/m     Wt Readings from Last 3 Encounters:  02/08/24 199 lb (90.3 kg)  01/21/24 193 lb 9 oz (87.8 kg)  12/31/23 197 lb 1.5 oz (89.4 kg)    GEN: Well nourished, well developed in no acute distress HEENT: Normal NECK: No JVD; No carotid bruits CARDIAC: S1/S2, slow rate and regular rhythm, no murmurs, rubs, gallops; 2+ peripheral pulses throughout, strong and equal bilaterally RESPIRATORY:  Clear and diminished to auscultation without rales, wheezing or rhonchi  MUSCULOSKELETAL:  No edema; No deformity  SKIN: Warm and dry NEUROLOGIC:  Alert and oriented x 3 PSYCHIATRIC:  Normal affect   ASSESSMENT & PLAN:    In order of problems listed above:  1. PSVT, Frequent PAC's, bradycardia Admits to recent irregular heart beats noted on his Smart Watch, has noticed bradycardia - see recent monitor report  noted above. SR on exam today. Will refer to EP for further evaluation. Continue to follow with PCP. Care and ED precautions discussed.   2. Hyperlipidemia, medication management Labs 05/2023 revealed LDL 112. Continue Crestor. Will obtain FLP at this time. Heart healthy diet and regular cardiovascular exercise encouraged. Continue to follow with PCP.  3. Asbestos exposure Past history of exposure to asbestos.  Continue to follow-up with pulmonology and PCP.  4. Multiple myeloma and history of prostate cancer Continue to follow-up with oncology and urology.     5.  Disposition: Follow-up with Dr. Diona Stewart or APP in 6 weeks or sooner if anything changes.  Medication Adjustments/Labs and Tests Ordered: Current medicines are reviewed at length with the patient today.  Concerns regarding medicines are outlined above.  Orders Placed This Encounter  Procedures   Lipid panel   Ambulatory referral to Cardiac Electrophysiology   EKG 12-Lead   No orders of the defined types were placed in this encounter.   Patient Instructions  Medication Instructions:   Continue all current medications.   Labwork:  FLP Reminder:  Nothing to eat or drink after 12 midnight prior to labs. Office will contact with results via phone, letter or mychart.  Testing/Procedures:  none  Follow-Up:  6 weeks    Any Other Special Instructions Will Be Listed Below (If Applicable).  You have been referred to:  EP   If you need a refill on your cardiac medications before your next appointment, please call your pharmacy.    Signed, [name], NP

## 2024-02-18 ENCOUNTER — Other Ambulatory Visit: Payer: Self-pay | Admitting: Hematology

## 2024-02-18 ENCOUNTER — Inpatient Hospital Stay: Payer: Medicare HMO | Attending: Hematology

## 2024-02-18 ENCOUNTER — Inpatient Hospital Stay: Payer: Medicare HMO

## 2024-02-18 VITALS — Wt 199.1 lb

## 2024-02-18 DIAGNOSIS — C61 Malignant neoplasm of prostate: Secondary | ICD-10-CM

## 2024-02-18 DIAGNOSIS — C9000 Multiple myeloma not having achieved remission: Secondary | ICD-10-CM

## 2024-02-18 DIAGNOSIS — E785 Hyperlipidemia, unspecified: Secondary | ICD-10-CM | POA: Diagnosis not present

## 2024-02-18 DIAGNOSIS — Z95828 Presence of other vascular implants and grafts: Secondary | ICD-10-CM

## 2024-02-18 DIAGNOSIS — C7951 Secondary malignant neoplasm of bone: Secondary | ICD-10-CM | POA: Insufficient documentation

## 2024-02-18 DIAGNOSIS — Z79899 Other long term (current) drug therapy: Secondary | ICD-10-CM

## 2024-02-18 LAB — CBC WITH DIFFERENTIAL/PLATELET
Abs Immature Granulocytes: 0 10*3/uL (ref 0.00–0.07)
Basophils Absolute: 0 10*3/uL (ref 0.0–0.1)
Basophils Relative: 1 %
Eosinophils Absolute: 0.2 10*3/uL (ref 0.0–0.5)
Eosinophils Relative: 7 %
HCT: 33.6 % — ABNORMAL LOW (ref 39.0–52.0)
Hemoglobin: 11.2 g/dL — ABNORMAL LOW (ref 13.0–17.0)
Immature Granulocytes: 0 %
Lymphocytes Relative: 24 %
Lymphs Abs: 0.7 10*3/uL (ref 0.7–4.0)
MCH: 31.1 pg (ref 26.0–34.0)
MCHC: 33.3 g/dL (ref 30.0–36.0)
MCV: 93.3 fL (ref 80.0–100.0)
Monocytes Absolute: 0.3 10*3/uL (ref 0.1–1.0)
Monocytes Relative: 10 %
Neutro Abs: 1.7 10*3/uL (ref 1.7–7.7)
Neutrophils Relative %: 58 %
Platelets: 142 10*3/uL — ABNORMAL LOW (ref 150–400)
RBC: 3.6 MIL/uL — ABNORMAL LOW (ref 4.22–5.81)
RDW: 15.9 % — ABNORMAL HIGH (ref 11.5–15.5)
WBC: 2.9 10*3/uL — ABNORMAL LOW (ref 4.0–10.5)
nRBC: 0 % (ref 0.0–0.2)

## 2024-02-18 LAB — COMPREHENSIVE METABOLIC PANEL
ALT: 20 U/L (ref 0–44)
AST: 22 U/L (ref 15–41)
Albumin: 3.5 g/dL (ref 3.5–5.0)
Alkaline Phosphatase: 31 U/L — ABNORMAL LOW (ref 38–126)
Anion gap: 8 (ref 5–15)
BUN: 17 mg/dL (ref 8–23)
CO2: 26 mmol/L (ref 22–32)
Calcium: 8.4 mg/dL — ABNORMAL LOW (ref 8.9–10.3)
Chloride: 105 mmol/L (ref 98–111)
Creatinine, Ser: 1.03 mg/dL (ref 0.61–1.24)
GFR, Estimated: >60 mL/min (ref >60)
Glucose, Bld: 90 mg/dL (ref 70–99)
Potassium: 3.5 mmol/L (ref 3.5–5.1)
Sodium: 139 mmol/L (ref 135–145)
Total Bilirubin: 0.8 mg/dL (ref 0.0–1.2)
Total Protein: 5.9 g/dL — ABNORMAL LOW (ref 6.5–8.1)

## 2024-02-18 LAB — LIPID PANEL
Cholesterol: 120 mg/dL (ref 0–200)
HDL: 54 mg/dL (ref >40)
LDL Cholesterol: 57 mg/dL (ref 0–99)
Total CHOL/HDL Ratio: 2.2 {ratio}
Triglycerides: 44 mg/dL (ref <150)
VLDL: 9 mg/dL (ref 0–40)

## 2024-02-18 LAB — MAGNESIUM: Magnesium: 2 mg/dL (ref 1.7–2.4)

## 2024-02-18 MED ORDER — HEPARIN SOD (PORK) LOCK FLUSH 100 UNIT/ML IV SOLN
500.0000 [IU] | Freq: Once | INTRAVENOUS | Status: AC
  Administered 2024-02-18: 500 [IU] via INTRAVENOUS

## 2024-02-18 MED ORDER — DENOSUMAB 120 MG/1.7ML ~~LOC~~ SOLN
120.0000 mg | Freq: Once | SUBCUTANEOUS | Status: AC
  Administered 2024-02-18: 120 mg via SUBCUTANEOUS
  Filled 2024-02-18: qty 1.7

## 2024-02-18 MED ORDER — SODIUM CHLORIDE 0.9% FLUSH
10.0000 mL | INTRAVENOUS | Status: DC | PRN
  Administered 2024-02-18: 10 mL via INTRAVENOUS

## 2024-02-18 NOTE — Progress Notes (Signed)
 Patients port flushed without difficulty.  Good blood return noted with no bruising or swelling noted at site.  Band aid applied.  VSS with discharge and left in satisfactory condition with no s/s of distress noted.

## 2024-02-18 NOTE — Patient Instructions (Signed)
 [facility] [place] - A DEPT OF [facility]  Discharge Instructions: Thank you for choosing [facility] to provide your oncology and hematology care.  If you have a lab appointment with the Cancer Center - please note that after April 8th, 2024, all labs will be drawn in the cancer center.  You do not have to check in or register with the main entrance as you have in the past but will complete your check-in in the cancer center.  Wear comfortable clothing and clothing appropriate for easy access to any Portacath or PICC line.   We strive to give you quality time with your provider. You may need to reschedule your appointment if you arrive late (15 or more minutes).  Arriving late affects you and other patients whose appointments are after yours.  Also, if you miss three or more appointments without notifying the office, you may be dismissed from the clinic at the provider's discretion.      For prescription refill requests, have your pharmacy contact our office and allow 72 hours for refills to be completed.    Today you received the following:  Xgeva.  Denosumab Injection (Oncology) What is this medication? DENOSUMAB (den oh SUE mab) prevents weakened bones caused by cancer. It may also be used to treat noncancerous bone tumors that cannot be removed by surgery. It can also be used to treat high calcium levels in the blood caused by cancer. It works by blocking a protein that causes bones to break down quickly. This slows down the release of calcium from bones, which lowers calcium levels in your blood. It also makes your bones stronger and less likely to break (fracture). This medicine may be used for other purposes; ask your health care provider or pharmacist if you have questions. COMMON BRAND NAME(S): XGEVA What should I tell my care team before I take this medication? They need to know if you have any of these conditions: Dental disease Having surgery  or tooth extraction Infection Kidney disease Low levels of calcium or vitamin D in the blood Malnutrition On hemodialysis Skin conditions or sensitivity Thyroid or parathyroid disease An unusual reaction to denosumab, other medications, foods, dyes, or preservatives Pregnant or trying to get pregnant Breast-feeding How should I use this medication? This medication is for injection under the skin. It is given by your care team in a hospital or clinic setting. A special MedGuide will be given to you before each treatment. Be sure to read this information carefully each time. Talk to your care team about the use of this medication in children. While it may be prescribed for children as young as 13 years for selected conditions, precautions do apply. Overdosage: If you think you have taken too much of this medicine contact a poison control center or emergency room at once. NOTE: This medicine is only for you. Do not share this medicine with others. What if I miss a dose? Keep appointments for follow-up doses. It is important not to miss your dose. Call your care team if you are unable to keep an appointment. What may interact with this medication? Do not take this medication with any of the following: Other medications containing denosumab This medication may also interact with the following: Medications that lower your chance of fighting infection Steroid medications, such as prednisone or cortisone This list may not describe all possible interactions. Give your health care provider a list of all the medicines, herbs, non-prescription  drugs, or dietary supplements you use. Also tell them if you smoke, drink alcohol, or use illegal drugs. Some items may interact with your medicine. What should I watch for while using this medication? Your condition will be monitored carefully while you are receiving this medication. You may need blood work while taking this medication. This medication may  increase your risk of getting an infection. Call your care team for advice if you get a fever, chills, sore throat, or other symptoms of a cold or flu. Do not treat yourself. Try to avoid being around people who are sick. You should make sure you get enough calcium and vitamin D while you are taking this medication, unless your care team tells you not to. Discuss the foods you eat and the vitamins you take with your care team. Some people who take this medication have severe bone, joint, or muscle pain. This medication may also increase your risk for jaw problems or a broken thigh bone. Tell your care team right away if you have severe pain in your jaw, bones, joints, or muscles. Tell your care team if you have any pain that does not go away or that gets worse. Talk to your care team if you may be pregnant. Serious birth defects can occur if you take this medication during pregnancy and for 5 months after the last dose. You will need a negative pregnancy test before starting this medication. Contraception is recommended while taking this medication and for 5 months after the last dose. Your care team can help you find the option that works for you. What side effects may I notice from receiving this medication? Side effects that you should report to your care team as soon as possible: Allergic reactions--skin rash, itching, hives, swelling of the face, lips, tongue, or throat Bone, joint, or muscle pain Low calcium level--muscle pain or cramps, confusion, tingling, or numbness in the hands or feet Osteonecrosis of the jaw--pain, swelling, or redness in the mouth, numbness of the jaw, poor healing after dental work, unusual discharge from the mouth, visible bones in the mouth Side effects that usually do not require medical attention (report to your care team if they continue or are bothersome): Cough Diarrhea Fatigue Headache Nausea This list may not describe all possible side effects. Call your  doctor for medical advice about side effects. You may report side effects to FDA at 1-800-FDA-1088. Where should I keep my medication? This medication is given in a hospital or clinic. It will not be stored at home. NOTE: This sheet is a summary. It may not cover all possible information. If you have questions about this medicine, talk to your doctor, pharmacist, or health care provider.  2024 Elsevier/Gold Standard (2022-04-30 00:00:00)     To help prevent nausea and vomiting after your treatment, we encourage you to take your nausea medication as directed.  BELOW ARE SYMPTOMS THAT SHOULD BE REPORTED IMMEDIATELY: *FEVER GREATER THAN 100.4 F (38 C) OR HIGHER *CHILLS OR SWEATING *NAUSEA AND VOMITING THAT IS NOT CONTROLLED WITH YOUR NAUSEA MEDICATION *UNUSUAL SHORTNESS OF BREATH *UNUSUAL BRUISING OR BLEEDING *URINARY PROBLEMS (pain or burning when urinating, or frequent urination) *BOWEL PROBLEMS (unusual diarrhea, constipation, pain near the anus) TENDERNESS IN MOUTH AND THROAT WITH OR WITHOUT PRESENCE OF ULCERS (sore throat, sores in mouth, or a toothache) UNUSUAL RASH, SWELLING OR PAIN  UNUSUAL VAGINAL DISCHARGE OR ITCHING   Items with * indicate a potential emergency and should be followed up as soon as possible or  go to the Emergency Department if any problems should occur.  Please show the CHEMOTHERAPY ALERT CARD or IMMUNOTHERAPY ALERT CARD at check-in to the Emergency Department and triage nurse.  Should you have questions after your visit or need to cancel or reschedule your appointment, please contact [facility] [place] - A DEPT OF [facility]igh[facility] [phone]  and follow the prompts.  Office hours are 8:00 a.m. to 4:30 p.m. Monday - Friday. Please note that voicemails left after 4:00 p.m. may not be returned until the following business day.  We are closed weekends and major holidays. You have access to a nurse at all times for urgent questions. Please call  the main number to the clinic [phone] and follow the prompts.  For any non-urgent questions, you may also contact your provider using MyChart. We now offer e-Visits for anyone 91 and older to request care online for non-urgent symptoms. For details visit [email or web address].   Also download the MyChart app! Go to the app store, search "MyChart", open the app, select [place], and log in with your MyChart username and password.

## 2024-02-18 NOTE — Progress Notes (Signed)
 Patient tolerated Xgeva injection with no complaints voiced.  Site clean and dry with no bruising or swelling noted.  No complaints of pain.  Patient states he is taking Vitamin D and a multivitamin daily.  Discharged with vital signs stable and no signs or symptoms of distress noted.

## 2024-02-19 ENCOUNTER — Telehealth: Payer: Self-pay | Admitting: Orthopedic Surgery

## 2024-02-19 ENCOUNTER — Encounter: Payer: Self-pay | Admitting: Internal Medicine

## 2024-02-19 ENCOUNTER — Ambulatory Visit (INDEPENDENT_AMBULATORY_CARE_PROVIDER_SITE_OTHER): Payer: Medicare HMO | Admitting: Internal Medicine

## 2024-02-19 ENCOUNTER — Other Ambulatory Visit: Payer: Self-pay

## 2024-02-19 VITALS — BP 118/65 | HR 56 | Ht 74.0 in | Wt 200.4 lb

## 2024-02-19 DIAGNOSIS — C9000 Multiple myeloma not having achieved remission: Secondary | ICD-10-CM

## 2024-02-19 DIAGNOSIS — I471 Supraventricular tachycardia, unspecified: Secondary | ICD-10-CM | POA: Diagnosis not present

## 2024-02-19 DIAGNOSIS — Z0001 Encounter for general adult medical examination with abnormal findings: Secondary | ICD-10-CM | POA: Insufficient documentation

## 2024-02-19 DIAGNOSIS — K219 Gastro-esophageal reflux disease without esophagitis: Secondary | ICD-10-CM | POA: Diagnosis not present

## 2024-02-19 DIAGNOSIS — M48062 Spinal stenosis, lumbar region with neurogenic claudication: Secondary | ICD-10-CM

## 2024-02-19 DIAGNOSIS — E782 Mixed hyperlipidemia: Secondary | ICD-10-CM | POA: Diagnosis not present

## 2024-02-19 DIAGNOSIS — C61 Malignant neoplasm of prostate: Secondary | ICD-10-CM

## 2024-02-19 DIAGNOSIS — G609 Hereditary and idiopathic neuropathy, unspecified: Secondary | ICD-10-CM

## 2024-02-19 DIAGNOSIS — R7303 Prediabetes: Secondary | ICD-10-CM

## 2024-02-19 MED ORDER — LENALIDOMIDE 15 MG PO CAPS: 15.0000 mg | ORAL_CAPSULE | Freq: Every day | ORAL | 0 refills | Status: DC

## 2024-02-19 MED ORDER — DULOXETINE HCL 60 MG PO CPEP: 60.0000 mg | ORAL_CAPSULE | Freq: Every day | ORAL | 5 refills | Status: DC

## 2024-02-19 NOTE — Assessment & Plan Note (Signed)
On Revlimid and infusion chemotherapy Followed by Dr. Katragadda and Dr. McKay 

## 2024-02-19 NOTE — Telephone Encounter (Signed)
 Dr. Mort Sawyers pt - spoke w/the pt, he is wanting to speak w/you regarding a mattress he is looking at buying before he does.  He is having back issues.  [phone]

## 2024-02-19 NOTE — Progress Notes (Addendum)
 Established Patient Office Visit  Subjective:  Patient ID: Luis Stewart, male    DOB: Mar 30, 1951  Age: 73 y.o. MRN: [id number]  CC:  Chief Complaint  Patient presents with   Annual Exam    CPE    HPI Luis Stewart is a 73 y.o. male with past medical history of GERD, chronic low back pain, asbestosis and atypical chest pain who presents for annual physical.  He has been taking Pantoprazole QOD for GERD. Currently denies any nausea, vomiting, dysphagia or odynophagia. C/o chronic constipation, but denies melena or hematochezia. Has tried OTC stool softener with some relief, but takes inconsistently.  Prostate cancer: Followed by urology and oncology.  He is on degarelix for it.  Denies any dysuria or hematuria currently.  Takes tamsulosin for urinary hesitancy.   Multiple myeloma: Followed by Dr. Ellin Saba and Dr. Anne Hahn.  He is on Revlimid and infusion chemotherapy for it. He has had stem cell collection at [facility].  His back pain had improved since starting chemotherapy. Today, he reports low back pain, intermittent, radiating to right LE and unrelated to activity.  He still has mild numbness of the LE.  Denies any recent fall.  He has noticed mild improvement in neuropathy symptoms with Cymbalta 30 mg QD.  Past Medical History:  Diagnosis Date   Arthritis    Asbestosis (HCC)    Closed fracture of distal end of fibula with tibia with routine healing 05/28/2020   COVID-19 09/03/2021   Cyst of kidney, acquired    Cystic disease of liver    Enlarged prostate    Headache    History of fracture of leg    Right, childhood   Hx of migraine headaches    Hyperlipidemia    Multiple myeloma (HCC)    Neuropathy    PE (pulmonary thromboembolism) (HCC)    After knee surgery   Prostate cancer (HCC) 2015   Adenocarcinoma by biopsy    Past Surgical History:  Procedure Laterality Date   BIOPSY PROSTATE  2003 and 2005   COLONOSCOPY  11/28/2008   Dr.  Rourk:internal hemorrhoids/tortus colon/ascending colon polyps, tubular adenomas   COLONOSCOPY N/A 09/04/2014   Procedure: COLONOSCOPY;  Surgeon: Corbin Ade, MD;  [facility];  Service: Endoscopy;  Laterality: N/A;  9:30   COLONOSCOPY N/A 07/17/2017   Procedure: COLONOSCOPY;  Surgeon: West Bali, MD;  Location: [facility];  Service: Endoscopy;  Laterality: N/A;   COLONOSCOPY WITH PROPOFOL N/A 10/05/2023   Procedure: COLONOSCOPY WITH PROPOFOL;  Surgeon: Corbin Ade, MD;  [facility];  Service: Endoscopy;  Laterality: N/A;  10:00AM, ASA 3   ESOPHAGOGASTRODUODENOSCOPY N/A 07/16/2017   Procedure: ESOPHAGOGASTRODUODENOSCOPY (EGD);  Surgeon: West Bali, MD;  [facility];  Service: Endoscopy;  Laterality: N/A;   GIVENS CAPSULE STUDY  07/16/2017   Procedure: GIVENS CAPSULE STUDY;  Surgeon: West Bali, MD;  [facility];  Service: Endoscopy;;   JOINT REPLACEMENT N/A    Phreesia 01/12/2021   POLYPECTOMY  07/17/2017   Procedure: POLYPECTOMY;  Surgeon: West Bali, MD;  [facility]: [facility];  Service: Endoscopy;;  cecal   POLYPECTOMY  10/05/2023   Procedure: POLYPECTOMY;  Surgeon: Corbin Ade, MD;  [facility];  Service: Endoscopy;;   PORTACATH PLACEMENT Left 04/18/2022   Procedure: INSERTION PORT-A-CATH;  Surgeon: Franky Macho, MD;  Location: [facility];  Service: General;  Laterality: Left;   TOTAL KNEE ARTHROPLASTY Right  06/10/2017   TOTAL KNEE ARTHROPLASTY Right 06/10/2017   Procedure: TOTAL KNEE ARTHROPLASTY;  Surgeon: Loreta Ave, MD;  Location: [facility] OR;  Service: Orthopedics;  Laterality: Right;    Family History  Problem Relation Age of Onset   Diabetes Mother    Hypertension Mother    Obesity Mother    Heart disease Mother    Mental illness Mother    Hypertension Sister    Obesity Sister    Arthritis Sister    Deep vein thrombosis Father    Dementia Father    Heart disease Father        CABG    Colon cancer Neg Hx     Social History   Socioeconomic History   Marital status: Married    Spouse name: Alvino Chapel ann   Number of children: Not on file   Years of education: Not on file   Highest education level: Not on file  Occupational History   Occupation: Retired  Tobacco Use   Smoking status: Never   Smokeless tobacco: Never  Vaping Use   Vaping status: Never Used  Substance and Sexual Activity   Alcohol use: No   Drug use: No   Sexual activity: Yes  Other Topics Concern   Not on file  Social History Narrative   Previously worked at [employer]   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   Social Drivers of Corporate investment banker Strain: Low Risk  (12/10/2023)   Overall Financial Resource Strain (CARDIA)    Difficulty of Paying Living Expenses: Not hard at all  Food Insecurity: No Food Insecurity (12/10/2023)   Hunger Vital Sign    Worried About Running Out of Food in the Last Year: Never true    Ran Out of Food in the Last Year: Never true  Transportation Needs: No Transportation Needs (12/10/2023)   PRAPARE - Administrator, Civil Service (Medical): No    Lack of Transportation (Non-Medical): No  Physical Activity: Insufficiently Active (02/09/2023)   Exercise Vital Sign    Days of Exercise per Week: 5 days    Minutes of Exercise per Session: 20 min  Stress: No Stress Concern Present (06/01/2023)   [employer] of Occupational Health - Occupational Stress Questionnaire    Feeling of Stress : Not at all  Social Connections: Socially Integrated (06/01/2023)   Social Connection and Isolation Panel [NHANES]    Frequency of Communication with Friends and Family: More than three times a week    Frequency of Social Gatherings with Friends and Family: More than three times a week    Attends Religious Services: More than 4 times per year    Active Member of [employer] or Organizations: Yes    Attends Hospital doctor: More than 4 times per year    Marital Status: Married  Catering manager Violence: Not At Risk (02/09/2023)   Humiliation, Afraid, Rape, and Kick questionnaire    Fear of Current or Ex-Partner: No    Emotionally Abused: No    Physically Abused: No    Sexually Abused: No    Outpatient Medications Prior to Visit  Medication Sig Dispense Refill   acetaminophen (TYLENOL) 500 MG tablet Take 1,000 mg by mouth every 6 (six) hours as needed for mild pain or moderate pain.     acyclovir (ZOVIRAX) 400 MG tablet TAKE 1 TABLET BY MOUTH TWICE A DAY 180 tablet 2   Cholecalciferol (VITAMIN D)  2000 units CAPS Take 2,000 Units by mouth every other day.     ferrous sulfate 325 (65 FE) MG EC tablet Take 325 mg by mouth daily.     lenalidomide (REVLIMID) 15 MG capsule Take 1 capsule (15 mg total) by mouth daily. Take for 21 days, then hold for 7 days. Repeat every 28 days. 21 capsule 0   Multiple Vitamin (MULTIVITAMIN WITH MINERALS) TABS tablet Take 1 tablet by mouth daily with breakfast. Centrum Silver for Men     nitroGLYCERIN (NITROSTAT) 0.4 MG SL tablet Place 1 tablet (0.4 mg total) under the tongue every 5 (five) minutes x 3 doses as needed for chest pain (If no relief after 3rd dose, call or go to ED.). 30 tablet 0   pantoprazole (PROTONIX) 40 MG tablet TAKE 1 TABLET BY MOUTH EVERY OTHER DAY 45 tablet 1   RESTASIS 0.05 % ophthalmic emulsion Place 1 drop into both eyes 2 (two) times daily.     rosuvastatin (CRESTOR) 5 MG tablet Take 1 tablet (5 mg total) by mouth daily. 90 tablet 3   tamsulosin (FLOMAX) 0.4 MG CAPS capsule Take 0.4 mg by mouth every other day.     DULoxetine (CYMBALTA) 30 MG capsule TAKE 1 CAPSULE BY MOUTH EVERY DAY 90 capsule 2   No facility-administered medications prior to visit.    Allergies  Allergen Reactions   Sulfa Antibiotics Other (See Comments)    Unknown reaction. Childhood reaction    ROS Review of Systems  Constitutional:  Negative for chills and fever.   HENT:  Negative for congestion and sore throat.   Eyes:  Negative for pain and discharge.  Respiratory:  Negative for cough and shortness of breath.   Cardiovascular:  Negative for chest pain and palpitations.  Gastrointestinal:  Positive for constipation. Negative for diarrhea, nausea and vomiting.  Endocrine: Negative for polydipsia and polyuria.  Genitourinary:  Negative for dysuria and hematuria.  Musculoskeletal:  Positive for back pain. Negative for neck pain and neck stiffness.  Skin:  Negative for rash.  Neurological:  Positive for numbness. Negative for dizziness, weakness and headaches.  Psychiatric/Behavioral:  Negative for agitation and behavioral problems.       Objective:    Physical Exam Vitals reviewed.  Constitutional:      General: He is not in acute distress.    Appearance: He is not diaphoretic.  HENT:     Head: Normocephalic and atraumatic.     Nose: Nose normal.     Mouth/Throat:     Mouth: Mucous membranes are moist.  Eyes:     General: No scleral icterus.    Extraocular Movements: Extraocular movements intact.  Cardiovascular:     Rate and Rhythm: Normal rate and regular rhythm.     Heart sounds: Normal heart sounds. No murmur heard. Pulmonary:     Breath sounds: Normal breath sounds. No wheezing or rales.  Abdominal:     Palpations: Abdomen is soft.     Tenderness: There is no abdominal tenderness.  Musculoskeletal:     Cervical back: Neck supple. No tenderness.     Lumbar back: No tenderness. Decreased range of motion.     Right lower leg: No edema.     Left lower leg: No edema.  Skin:    General: Skin is warm.     Findings: No rash.  Neurological:     General: No focal deficit present.     Mental Status: He is alert and oriented to person, place, and  time.     Sensory: Sensory deficit (B/l feet) present.     Motor: No weakness.  Psychiatric:        Mood and Affect: Mood normal.        Behavior: Behavior normal.     BP 118/65    Pulse (!) 56   Ht 6\' 2"  (1.88 m)   Wt 200 lb 6.4 oz (90.9 kg)   SpO2 93%   BMI 25.73 kg/m  Wt Readings from Last 3 Encounters:  02/19/24 200 lb 6.4 oz (90.9 kg)  02/18/24 199 lb 1.2 oz (90.3 kg)  02/08/24 199 lb (90.3 kg)    Lab Results  Component Value Date   TSH 4.070 02/06/2022   Lab Results  Component Value Date   WBC 2.9 (L) 02/18/2024   HGB 11.2 (L) 02/18/2024   HCT 33.6 (L) 02/18/2024   MCV 93.3 02/18/2024   PLT 142 (L) 02/18/2024   Lab Results  Component Value Date   NA 139 02/18/2024   K 3.5 02/18/2024   CO2 26 02/18/2024   GLUCOSE 90 02/18/2024   BUN 17 02/18/2024   CREATININE 1.03 02/18/2024   BILITOT 0.8 02/18/2024   ALKPHOS 31 (L) 02/18/2024   AST 22 02/18/2024   ALT 20 02/18/2024   PROT 5.9 (L) 02/18/2024   ALBUMIN 3.5 02/18/2024   CALCIUM 8.4 (L) 02/18/2024   ANIONGAP 8 02/18/2024   EGFR 48 (L) 02/06/2022   Lab Results  Component Value Date   CHOL 120 02/18/2024   Lab Results  Component Value Date   HDL 54 02/18/2024   Lab Results  Component Value Date   LDLCALC 57 02/18/2024   Lab Results  Component Value Date   TRIG 44 02/18/2024   Lab Results  Component Value Date   CHOLHDL 2.2 02/18/2024   Lab Results  Component Value Date   HGBA1C 5.6 02/18/2023      Assessment & Plan:   Problem List Items Addressed This Visit       Cardiovascular and Mediastinum   PSVT (paroxysmal supraventricular tachycardia) (HCC)   Had cardiac monitor for 2 weeks - Multiple (949) brief episodes of PSVT were noted, the longest of which was 10.6 seconds. Planned to see EP cardiology        Digestive   GERD (gastroesophageal reflux disease)   Well-controlled with Pantoprazole 40 mg QOD        Nervous and Auditory   Idiopathic peripheral neuropathy   Could be due to lumbar spinal stenosis or MM related pelvic mass or chemotherapy-induced He prefers to avoid Gabapentin, and tried Lyrica without much benefit Started Cymbalta 30 mg QD -  increased dose to 60 mg due to persistent symptoms      Relevant Medications   DULoxetine (CYMBALTA) 60 MG capsule   Other Relevant Orders   TSH + free T4     Genitourinary   Prostate cancer (HCC)   Has had prostate biopsy Downtrending PSA Follows up with Urologist - under PSA surveillance Did not tolerate Finasteride Followed by Oncology - On delarelix now        Other   Hyperlipidemia   On Crestor now Checked lipid profile      Multiple myeloma (HCC)   On Revlimid and infusion chemotherapy Followed by Dr. Ellin Saba and Dr. Anne Hahn      Prediabetes   Lab Results  Component Value Date   HGBA1C 5.6 02/18/2023   Advised to follow low-carb diet      Relevant  Orders   Hemoglobin A1c   Spinal stenosis of lumbar region with neurogenic claudication   Low back pain improved since starting chemotherapy Has been intermittent numbness of the LE Previous MRI of lumbar spine (12/22) showed metastatic mass and lumbar spinal stenosis Has tried Lyrica for neuropathy symptoms without much benefit,  prefers to avoid gabapentin On Cymbalta 30 mg QD - increased dose to 60 mg QD now       Relevant Medications   DULoxetine (CYMBALTA) 60 MG capsule   Encounter for general adult medical examination with abnormal findings - Primary   Physical exam as documented. Fasting blood tests ordered today. Advised to get Shingrix and Tdap vaccines at local pharmacy.         Meds ordered this encounter  Medications   DULoxetine (CYMBALTA) 60 MG capsule    Sig: Take 1 capsule (60 mg total) by mouth daily.    Dispense:  30 capsule    Refill:  5    Follow-up: Return in about 6 months (around 08/18/2024) for GERD and neuropathy.    [name], MD

## 2024-02-19 NOTE — Telephone Encounter (Signed)
 Chart reviewed. Revlimid refilled per last office note with Dr. Ellin Saba.

## 2024-02-19 NOTE — Assessment & Plan Note (Addendum)
 Low back pain improved since starting chemotherapy Has been intermittent numbness of the LE Previous MRI of lumbar spine (12/22) showed metastatic mass and lumbar spinal stenosis Has tried Lyrica for neuropathy symptoms without much benefit,  prefers to avoid gabapentin On Cymbalta 30 mg QD - increased dose to 60 mg QD now

## 2024-02-19 NOTE — Assessment & Plan Note (Addendum)
 Could be due to lumbar spinal stenosis or MM related pelvic mass or chemotherapy-induced He prefers to avoid Gabapentin, and tried Lyrica without much benefit Started Cymbalta 30 mg QD - increased dose to 60 mg due to persistent symptoms

## 2024-02-19 NOTE — Assessment & Plan Note (Signed)
Has had prostate biopsy Downtrending PSA Follows up with Urologist - under PSA surveillance Did not tolerate Finasteride Followed by Oncology - On delarelix now

## 2024-02-19 NOTE — Assessment & Plan Note (Addendum)
 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 Planned to see EP cardiology

## 2024-02-19 NOTE — Assessment & Plan Note (Addendum)
On Crestor now Checked lipid profile

## 2024-02-19 NOTE — Assessment & Plan Note (Signed)
 Lab Results  Component Value Date   HGBA1C 5.6 02/18/2023   Advised to follow low-carb diet

## 2024-02-19 NOTE — Assessment & Plan Note (Signed)
 Well-controlled with Pantoprazole 40 mg QOD

## 2024-02-19 NOTE — Patient Instructions (Signed)
 Please continue to take medications as prescribed.  Please continue to follow heart healthy diet and perform moderate exercise/walking at least 150 mins/week.  Please consider getting Shingrix and Tdap vaccine at local pharmacy.

## 2024-02-19 NOTE — Assessment & Plan Note (Addendum)
 Physical exam as documented. Fasting blood tests ordered today. Advised to get Shingrix and Tdap vaccines at local pharmacy.

## 2024-03-14 ENCOUNTER — Ambulatory Visit: Payer: Medicare HMO

## 2024-03-14 VITALS — Ht 74.0 in | Wt 200.0 lb

## 2024-03-14 DIAGNOSIS — H9193 Unspecified hearing loss, bilateral: Secondary | ICD-10-CM

## 2024-03-14 NOTE — Progress Notes (Signed)
 Because this visit was a virtual/telehealth visit,  certain criteria was not obtained, such a blood pressure, CBG if applicable, and timed get up and go. Any medications not marked as "taking" were not mentioned during the medication reconciliation part of the visit. Any vitals not documented were not able to be obtained due to this being a telehealth visit or patient was unable to self-report a recent blood pressure reading due to a lack of equipment at home via telehealth. Vitals that have been documented are verbally provided by the patient.   Subjective:   Luis Stewart is a 73 y.o. who presents for a Medicare Wellness preventive visit.  Visit Complete: Virtual I connected with  [name] on 03/14/24 by a audio enabled telemedicine application and verified that I am speaking with the correct person using two identifiers.  Patient Location: Other:  car  Provider Location: Home Office  I discussed the limitations of evaluation and management by telemedicine. The patient expressed understanding and agreed to proceed.  Vital Signs: Because this visit was a virtual/telehealth visit, some criteria may be missing or patient reported. Any vitals not documented were not able to be obtained and vitals that have been documented are patient reported.  VideoDeclined- This patient declined Librarian, academic. Therefore the visit was completed with audio only.  Persons Participating in Visit: Patient.  AWV Questionnaire: No: Patient Medicare AWV questionnaire was not completed prior to this visit.  Cardiac Risk Factors include: advanced age (>20men, >80 women);male gender     Objective:    Today's Vitals   03/14/24 1008 03/14/24 1011  Weight: 200 lb (90.7 kg)   Height: 6\' 2"  (1.88 m)   PainSc:  4    Body mass index is 25.68 kg/m.     03/14/2024   10:33 AM 12/31/2023   11:04 AM 10/29/2023    9:35 AM 10/05/2023    8:10 AM 10/02/2023     8:09 AM 10/01/2023   11:16 AM 08/20/2023   11:03 AM  Advanced Directives  Does Patient Have a Medical Advance Directive? No No No No No No No  Would patient like information on creating a medical advance directive? No - Patient declined No - Patient declined No - Patient declined No - Patient declined No - Patient declined No - Patient declined No - Patient declined    Current Medications (verified) Outpatient Encounter Medications as of 03/14/2024  Medication Sig   acetaminophen (TYLENOL) 500 MG tablet Take 1,000 mg by mouth every 6 (six) hours as needed for mild pain or moderate pain.   acyclovir (ZOVIRAX) 400 MG tablet TAKE 1 TABLET BY MOUTH TWICE A DAY   Cholecalciferol (VITAMIN D) 2000 units CAPS Take 2,000 Units by mouth every other day.   DULoxetine (CYMBALTA) 60 MG capsule Take 1 capsule (60 mg total) by mouth daily.   ferrous sulfate 325 (65 FE) MG EC tablet Take 325 mg by mouth daily.   lenalidomide (REVLIMID) 15 MG capsule Take 1 capsule (15 mg total) by mouth daily. Take for 21 days, then hold for 7 days. Repeat every 28 days.   Multiple Vitamin (MULTIVITAMIN WITH MINERALS) TABS tablet Take 1 tablet by mouth daily with breakfast. Centrum Silver for Men   nitroGLYCERIN (NITROSTAT) 0.4 MG SL tablet Place 1 tablet (0.4 mg total) under the tongue every 5 (five) minutes x 3 doses as needed for chest pain (If no relief after 3rd dose, call or go to ED.).   pantoprazole (PROTONIX)  40 MG tablet TAKE 1 TABLET BY MOUTH EVERY OTHER DAY   RESTASIS 0.05 % ophthalmic emulsion Place 1 drop into both eyes 2 (two) times daily.   rosuvastatin (CRESTOR) 5 MG tablet Take 1 tablet (5 mg total) by mouth daily.   tamsulosin (FLOMAX) 0.4 MG CAPS capsule Take 0.4 mg by mouth every other day.   No facility-administered encounter medications on file as of 03/14/2024.    Allergies (verified) Sulfa antibiotics   History: Past Medical History:  Diagnosis Date   Arthritis    Asbestosis (HCC)     Closed fracture of distal end of fibula with tibia with routine healing 05/28/2020   COVID-19 09/03/2021   Cyst of kidney, acquired    Cystic disease of liver    Enlarged prostate    Headache    History of fracture of leg    Right, childhood   Hx of migraine headaches    Hyperlipidemia    Multiple myeloma (HCC)    Neuropathy    PE (pulmonary thromboembolism) (HCC)    After knee surgery   Prostate cancer (HCC) 2015   Adenocarcinoma by biopsy   Past Surgical History:  Procedure Laterality Date   BIOPSY PROSTATE  2003 and 2005   COLONOSCOPY  11/28/2008   Dr. Rourk:internal hemorrhoids/tortus colon/ascending colon polyps, tubular adenomas   COLONOSCOPY N/A 09/04/2014   Procedure: COLONOSCOPY;  Surgeon: Corbin Ade, MD;  [facility];  Service: Endoscopy;  Laterality: N/A;  9:30   COLONOSCOPY N/A 07/17/2017   Procedure: COLONOSCOPY;  Surgeon: West Bali, MD;  [facility];  Service: Endoscopy;  Laterality: N/A;   COLONOSCOPY WITH PROPOFOL N/A 10/05/2023   Procedure: COLONOSCOPY WITH PROPOFOL;  Surgeon: Corbin Ade, MD;  [facility];  Service: Endoscopy;  Laterality: N/A;  10:00AM, ASA 3   ESOPHAGOGASTRODUODENOSCOPY N/A 07/16/2017   Procedure: ESOPHAGOGASTRODUODENOSCOPY (EGD);  Surgeon: West Bali, MD;  [facility];  Service: Endoscopy;  Laterality: N/A;   GIVENS CAPSULE STUDY  07/16/2017   Procedure: GIVENS CAPSULE STUDY;  Surgeon: West Bali, MD;  [facility];  Service: Endoscopy;;   JOINT REPLACEMENT N/A    Phreesia 01/12/2021   POLYPECTOMY  07/17/2017   Procedure: POLYPECTOMY;  Surgeon: West Bali, MD;  [facility];  Service: Endoscopy;;  cecal   POLYPECTOMY  10/05/2023   Procedure: POLYPECTOMY;  Surgeon: Corbin Ade, MD;  [facility];  Service: Endoscopy;;   PORTACATH PLACEMENT Left 04/18/2022   Procedure: INSERTION PORT-A-CATH;  Surgeon: Franky Macho, MD;  Location: [facility];  Service: General;  Laterality: Left;   TOTAL KNEE ARTHROPLASTY Right 06/10/2017   TOTAL KNEE ARTHROPLASTY Right 06/10/2017   Procedure: TOTAL KNEE ARTHROPLASTY;  Surgeon: Loreta Ave, MD;  Location: [facility] OR;  Service: Orthopedics;  Laterality: Right;   Family History  Problem Relation Age of Onset   Diabetes Mother    Hypertension Mother    Obesity Mother    Heart disease Mother    Mental illness Mother    Hypertension Sister    Obesity Sister    Arthritis Sister    Deep vein thrombosis Father    Dementia Father    Heart disease Father        CABG   Colon cancer Neg Hx    Social History   Socioeconomic History   Marital status: Married    Spouse name: Marvia Pickles   Number of children: Not  on file   Years of education: Not on file   Highest education level: Not on file  Occupational History   Occupation: Retired  Tobacco Use   Smoking status: Never   Smokeless tobacco: Never  Vaping Use   Vaping status: Never Used  Substance and Sexual Activity   Alcohol use: No   Drug use: No   Sexual activity: Yes  Other Topics Concern   Not on file  Social History Narrative   Previously worked at [employer]   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   Social Drivers of Corporate investment banker Strain: Low Risk  (03/14/2024)   Overall Financial Resource Strain (CARDIA)    Difficulty of Paying Living Expenses: Not hard at all  Food Insecurity: No Food Insecurity (03/14/2024)   Hunger Vital Sign    Worried About Running Out of Food in the Last Year: Never true    Ran Out of Food in the Last Year: Never true  Transportation Needs: No Transportation Needs (03/14/2024)   PRAPARE - Administrator, Civil Service (Medical): No    Lack of Transportation (Non-Medical): No  Physical Activity: Sufficiently Active (03/14/2024)   Exercise Vital Sign    Days of Exercise per Week: 7 days    Minutes of Exercise per Session: 30 min  Stress: No  Stress Concern Present (03/14/2024)   [employer] of Occupational Health - Occupational Stress Questionnaire    Feeling of Stress : Not at all  Social Connections: Socially Integrated (03/14/2024)   Social Connection and Isolation Panel [NHANES]    Frequency of Communication with Friends and Family: More than three times a week    Frequency of Social Gatherings with Friends and Family: More than three times a week    Attends Religious Services: More than 4 times per year    Active Member of [employer] or Organizations: Yes    Attends Engineer, structural: More than 4 times per year    Marital Status: Married    Tobacco Counseling Counseling given: Yes    Clinical Intake:  Pre-visit preparation completed: Yes  Pain : 0-10 Pain Score: 4  Pain Type: Chronic pain Pain Location: Back Pain Orientation: Lower Pain Descriptors / Indicators: Constant Pain Onset: More than a month ago     BMI - recorded: 25.68 Nutritional Risks: None Diabetes: No  Lab Results  Component Value Date   HGBA1C 5.6 02/18/2023   HGBA1C 6.1 (H) 02/06/2022     How often do you need to have someone help you when you read instructions, pamphlets, or other written materials from your doctor or pharmacy?: 1 - Never  Interpreter Needed?: No  Information entered by :: Maryjean Ka CMA   Activities of Daily Living     03/14/2024   10:16 AM 10/02/2023    8:08 AM  In your present state of health, do you have any difficulty performing the following activities:  Hearing? 1   Comment referral placed   Vision? 0   Difficulty concentrating or making decisions? 0   Walking or climbing stairs? 0   Dressing or bathing? 0   Doing errands, shopping? 0 0  Preparing Food and eating ? N   Using the Toilet? N   In the past six months, have you accidently leaked urine? N   Do you have problems with loss of bowel control? N   Managing your Medications? N   Managing your Finances?  N   Housekeeping or  managing your Housekeeping? N     Patient Care Team: [name], MD as PCP - General (Internal Medicine) Jonelle Sidle, MD as PCP - Cardiology (Cardiology) Jena Gauss Gerrit Friends, MD as Consulting Physician (Gastroenterology) Doreatha Massed, MD as Medical Oncologist (Medical Oncology) Therese Sarah, RN as Oncology Nurse Navigator (Medical Oncology) Clinton Gallant, RN as [facility] Care Management  Indicate any recent Medical Services you may have received from other than [facility] providers in the past year (date may be approximate).     Assessment:   This is a routine wellness examination for Cheick.  Hearing/Vision screen Hearing Screening - Comments:: Patient complains of difficulty with hearing. ENT referral placed. Patient is in agreement with treatment plan. Aware that the office will call with an appointment.   Vision Screening - Comments:: Wears rx glasses - up to date with routine eye exams  Patient sees Dr. Daisy Lazar w/ [facility] [place] office.     Goals Addressed               This Visit's Progress     Patient Stated   On track     Patient would like to exercise more       Patient Stated (pt-stated)   On track     My goal is to get my house paid off        Depression Screen     03/14/2024   10:35 AM 02/19/2024    7:56 AM 12/10/2023   12:03 PM 08/19/2023    8:01 AM 06/01/2023    9:48 AM 02/18/2023    8:04 AM 02/09/2023   11:01 AM  PHQ 2/9 Scores  PHQ - 2 Score 0 0 0 0 0 0 0  PHQ- 9 Score 0 0  2       Fall Risk     03/14/2024   10:34 AM 02/19/2024    7:59 AM 02/19/2024    7:56 AM 11/10/2023   10:04 AM 08/19/2023    8:01 AM  Fall Risk   Falls in the past year? 1 0 0 0 0  Number falls in past yr: 1 0 0 0 0  Injury with Fall? 0 0 0 0 0  Risk for fall due to : Other (Comment) No Fall Risks No Fall Risks    Risk for fall due to: Comment patient had a misstep off of the sidewalk      Follow up Education provided;Falls  prevention discussed Falls evaluation completed Falls evaluation completed      MEDICARE RISK AT HOME:  Medicare Risk at Home Any stairs in or around the home?: Yes If so, are there any without handrails?: No Home free of loose throw rugs in walkways, pet beds, electrical cords, etc?: No Adequate lighting in your home to reduce risk of falls?: Yes Life alert?: No Use of a cane, walker or w/c?: No Grab bars in the bathroom?: No Shower chair or bench in shower?: No Elevated toilet seat or a handicapped toilet?: Yes  TIMED UP AND GO:  Was the test performed?  No  Cognitive Function: 6CIT completed    02/09/2023   11:02 AM 02/03/2022    3:20 PM  MMSE - Mini Mental State Exam  Not completed: Unable to complete Unable to complete        03/14/2024   10:35 AM 02/09/2023   11:03 AM 02/03/2022    3:20 PM  6CIT  Screen  What Year? 0 points 0 points 0 points  What month? 0 points 0 points 0 points  What time? 0 points 0 points 0 points  Count back from 20 0 points 0 points 0 points  Months in reverse 0 points 0 points 0 points  Repeat phrase 0 points 0 points 0 points  Total Score 0 points 0 points 0 points    Immunizations Immunization History  Administered Date(s) Administered   Fluad Quad(high Dose 65+) 08/18/2019, 09/12/2020, 10/10/2021, 10/08/2022   Fluad Trivalent(High Dose 65+) 09/09/2023   Influenza, High Dose Seasonal PF 09/29/2015, 11/10/2018   Influenza,inj,Quad PF,6+ Mos 10/15/2016, 09/18/2017   Influenza-Unspecified 02/25/2011, 11/13/2014   Moderna SARS-COV2 Booster Vaccination 11/01/2020   Moderna Sars-Covid-2 Vaccination 01/28/2020, 02/28/2020   Pneumococcal Conjugate-13 11/20/2015   Pneumococcal Polysaccharide-23 10/15/2016   Tdap 02/25/2011   Zoster, Live 05/30/2013    Screening Tests Health Maintenance  Topic Date Due   Zoster Vaccines- Shingrix (1 of 2) 09/25/1969   COVID-19 Vaccine (3 - Moderna risk series) 11/29/2020   DTaP/Tdap/Td (2 - Td or Tdap)  02/24/2021   Medicare Annual Wellness (AWV)  02/10/2024   Colonoscopy  10/04/2033   Pneumonia Vaccine 40+ Years old  Completed   INFLUENZA VACCINE  Completed   Hepatitis C Screening  Completed   HPV VACCINES  Aged Out    Health Maintenance  Health Maintenance Due  Topic Date Due   Zoster Vaccines- Shingrix (1 of 2) 09/25/1969   COVID-19 Vaccine (3 - Moderna risk series) 11/29/2020   DTaP/Tdap/Td (2 - Td or Tdap) 02/24/2021   Medicare Annual Wellness (AWV)  02/10/2024   Health Maintenance Items Addressed: Referral to audiology for hearing test due to c/o difficulty hearing  Additional Screening:  Vision Screening: Recommended annual ophthalmology exams for early detection of glaucoma and other disorders of the eye.  Dental Screening: Recommended annual dental exams for proper oral hygiene  Community Resource Referral / Chronic Care Management: CRR required this visit?  No   CCM required this visit?  No     Plan:     I have personally reviewed and noted the following in the patient's chart:   Medical and social history Use of alcohol, tobacco or illicit drugs  Current medications and supplements including opioid prescriptions. Patient is not currently taking opioid prescriptions. Functional ability and status Nutritional status Physical activity Advanced directives List of other physicians Hospitalizations, surgeries, and ER visits in previous 12 months Vitals Screenings to include cognitive, depression, and falls Referrals and appointments  In addition, I have reviewed and discussed with patient certain preventive protocols, quality metrics, and best practice recommendations. A written personalized care plan for preventive services as well as general preventive health recommendations were provided to patient.     Jordan Hawks Demir Titsworth, CMA   03/14/2024   After Visit Summary: (MyChart) Due to this being a telephonic visit, the after visit summary with patients  personalized plan was offered to patient via MyChart   Notes: Nothing significant to report at this time.

## 2024-03-14 NOTE — Patient Instructions (Addendum)
 [name] , Thank you for taking time to come for your Medicare Wellness Visit. I appreciate your ongoing commitment to your health goals. Please review the following plan we discussed and let me know if I can assist you in the future.   Referrals/Orders/Follow-Ups/Clinician Recommendations:  Next Medicare Annual Wellness Visit:   March 15, 2025 at 9:20 am video visit.  You have been referred to an audiologist to have a hearing test.If you haven't heard from them in the next week, please call their office to schedule your appointment.   [facility] Address: [address], [place], [place] [address] Phone: [phone]  This is a list of the screening recommended for you and due dates:  Health Maintenance  Topic Date Due   Zoster (Shingles) Vaccine (1 of 2) 09/25/1969   COVID-19 Vaccine (3 - Moderna risk series) 11/29/2020   DTaP/Tdap/Td vaccine (2 - Td or Tdap) 02/24/2021   Medicare Annual Wellness Visit  02/10/2024   Colon Cancer Screening  10/04/2033   Pneumonia Vaccine  Completed   Flu Shot  Completed   Hepatitis C Screening  Completed   HPV Vaccine  Aged Out    Advanced directives: (Declined) Advance directive discussed with you today. Even though you declined this today, please call our office should you change your mind, and we can give you the proper paperwork for you to fill out.  Next Medicare Annual Wellness Visit scheduled for next year: yes  Understanding Your Risk for Falls Millions of people have serious injuries from falls each year. It is important to understand your risk of falling. Talk with your health care provider about your risk and what you can do to lower it. If you do have a serious fall, make sure to tell your provider. Falling once raises your risk of falling again. How can falls affect me? Serious injuries from falls are common. These include: Broken bones, such as hip fractures. Head injuries, such as traumatic brain injuries (TBI) or  concussions. A fear of falling can cause you to avoid activities and stay at home. This can make your muscles weaker and raise your risk for a fall. What can increase my risk? There are a number of risk factors that increase your risk for falling. The more risk factors you have, the higher your risk of falling. Serious injuries from a fall happen most often to people who are older than 74 years old. Teenagers and young adults ages 43-29 are also at higher risk. Common risk factors include: Weakness in the lower body. Being generally weak or confused due to long-term (chronic) illness. Dizziness or balance problems. Poor vision. Medicines that cause dizziness or drowsiness. These may include: Medicines for your blood pressure, heart, anxiety, insomnia, or swelling (edema). Pain medicines. Muscle relaxants. Other risk factors include: Drinking alcohol. Having had a fall in the past. Having foot pain or wearing improper footwear. Working at a dangerous job. Having any of the following in your home: Tripping hazards, such as floor clutter or loose rugs. Poor lighting. Pets. Having dementia or memory loss. What actions can I take to lower my risk of falling?     Physical activity Stay physically fit. Do strength and balance exercises. Consider taking a regular class to build strength and balance. Yoga and tai chi are good options. Vision Have your eyes checked every year and your prescription for glasses or contacts updated as needed. Shoes and walking aids Wear non-skid shoes. Wear shoes that have rubber soles  and low heels. Do not wear high heels. Do not walk around the house in socks or slippers. Use a cane or walker as told by your provider. Home safety Attach secure railings on both sides of your stairs. Install grab bars for your bathtub, shower, and toilet. Use a non-skid mat in your bathtub or shower. Attach bath mats securely with double-sided, non-slip rug tape. Use good  lighting in all rooms. Keep a flashlight near your bed. Make sure there is a clear path from your bed to the bathroom. Use night-lights. Do not use throw rugs. Make sure all carpeting is taped or tacked down securely. Remove all clutter from walkways and stairways, including extension cords. Repair uneven or broken steps and floors. Avoid walking on icy or slippery surfaces. Walk on the grass instead of on icy or slick sidewalks. Use ice melter to get rid of ice on walkways in the winter. Use a cordless phone. Questions to ask your health care provider Can you help me check my risk for a fall? Do any of my medicines make me more likely to fall? Should I take a vitamin D supplement? What exercises can I do to improve my strength and balance? Should I make an appointment to have my vision checked? Do I need a bone density test to check for weak bones (osteoporosis)? Would it help to use a cane or a walker? Where to find more information Centers for Disease Control and Prevention, STEADI: TonerPromos.no Community-Based Fall Prevention Programs: TonerPromos.no [employer] on Aging: BaseRingTones.pl Contact a health care provider if: You fall at home. You are afraid of falling at home. You feel weak, drowsy, or dizzy. This information is not intended to replace advice given to you by your health care provider. Make sure you discuss any questions you have with your health care provider. Document Revised: 08/11/2022 Document Reviewed: 08/11/2022 Elsevier Patient Education  2024 [employer].

## 2024-03-15 ENCOUNTER — Ambulatory Visit: Payer: Medicare HMO | Attending: Cardiovascular Disease | Admitting: Cardiovascular Disease

## 2024-03-15 ENCOUNTER — Encounter: Payer: Self-pay | Admitting: Cardiovascular Disease

## 2024-03-15 VITALS — BP 132/62 | HR 82 | Ht 74.0 in | Wt 192.8 lb

## 2024-03-15 DIAGNOSIS — I471 Supraventricular tachycardia, unspecified: Secondary | ICD-10-CM | POA: Diagnosis not present

## 2024-03-15 NOTE — Progress Notes (Signed)
 Electrophysiology Office Note:    Date:  03/15/2024   ID:  Luis, Stewart 08/08/1951, MRN [id number]  PCP:  [name], MD   [facility] Providers Cardiologist:  [name], MD     Referring MD: [name], NP   History of Present Illness:    Luis Stewart is a 73 y.o. male with a medical history significant for multiple medical issues, referred for evaluation of frequent atrial ectopy.     I discussed the use of AI scribe software for clinical note transcription with the patient, who gave verbal consent to proceed.  Luis Stewart is a 73 year old male with multiple myeloma who presents with abnormal heartbeats. He was referred by [name] for evaluation of abnormal heartbeats.  He experiences abnormal heartbeats, initially detected by his watch indicating possible atrial fibrillation. An episode occurred while his vitals were being taken at his cancer doctor's office, prompting a referral to a cardiologist. A monitor placed for a couple of weeks revealed that about ten percent of his heartbeats come in a little early, but it was not atrial fibrillation. He denies feeling his heart skipping beats but mentions occasional chest pains, which have been present for years. Multiple EKGs have not shown any significant findings during these episodes.  He was diagnosed with multiple myeloma in 2023 and underwent eight rounds of chemotherapy infusions. A stem cell transplant was considered, and stem cell harvesting was completed, but the transplant was not performed as his numbers improved. He continues to see his cancer doctor every three months and undergoes monthly blood work.  No lightheadedness, dizziness, or feeling like he is going to pass out. He mentions having sinus congestion currently, which makes him feel 'a little rough.'      EKGs/Labs/Other Studies Reviewed Today:     Echocardiogram:  TTE Apr 24, 2022 EF 60 to 65%.  Mild LVH.   Monitors:  14 day monitor January 2025-- my interpretation Sinus rhythm heart rate 33-121 beats minute, average 57 10.2 burden of supraventricular ectopy, often occurring in runs    Advanced imaging:  Coronary CT 10/2018 Coronary artery calcium score of 0   EKG:   EKG Interpretation Date/Time:  Tuesday March 15 2024 14:34:59 EDT Ventricular Rate:  82 PR Interval:  224 QRS Duration:  108 QT Interval:  376 QTC Calculation: 439 R Axis:   38  Text Interpretation: Sinus rhythm with 1st degree A-V block with Premature atrial complexes Incomplete right bundle branch block Minimal voltage criteria for LVH, may be normal variant ( Sokolow-Lyon ) Nonspecific ST abnormality When compared with ECG of 08-Feb-2024 12:05, No significant change was found Confirmed by York Pellant [phone]) on 03/15/2024 2:47:09 PM     Physical Exam:    VS:  BP 132/62   Pulse 82   Ht 6\' 2"  (1.88 m)   Wt 192 lb 12.8 oz (87.5 kg)   SpO2 95%   BMI 24.75 kg/m     Wt Readings from Last 3 Encounters:  03/15/24 192 lb 12.8 oz (87.5 kg)  03/14/24 200 lb (90.7 kg)  02/19/24 200 lb 6.4 oz (90.9 kg)     GEN: Well nourished, well developed in no acute distress CARDIAC: irregular rhythm, no murmurs, rubs, gallops RESPIRATORY:  Normal work of breathing MUSCULOSKELETAL: no edema    ASSESSMENT & PLAN:     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

## 2024-03-15 NOTE — Patient Instructions (Signed)
 Medication Instructions:  Your physician recommends that you continue on your current medications as directed. Please refer to the Current Medication list given to you today. *If you need a refill on your cardiac medications before your next appointment, please call your pharmacy*   Follow-Up: At [facility], you and your health needs are our priority.  As part of our continuing mission to provide you with exceptional heart care, we have created designated Provider Care Teams.  These Care Teams include your primary Cardiologist (physician) and Advanced Practice Providers (APPs -  Physician Assistants and Nurse Practitioners) who all work together to provide you with the care you need, when you need it.  We recommend signing up for the patient portal called "MyChart".  Sign up information is provided on this After Visit Summary.  MyChart is used to connect with patients for Virtual Visits (Telemedicine).  Patients are able to view lab/test results, encounter notes, upcoming appointments, etc.  Non-urgent messages can be sent to your provider as well.   To learn more about what you can do with MyChart, go to [email or web address].    Your next appointment:   03/22/24  Provider:   [name], NP

## 2024-03-17 ENCOUNTER — Inpatient Hospital Stay: Payer: Medicare HMO | Attending: Hematology

## 2024-03-17 ENCOUNTER — Inpatient Hospital Stay: Payer: Medicare HMO

## 2024-03-17 VITALS — BP 116/75 | HR 62 | Temp 97.4°F | Resp 20 | Wt 191.1 lb

## 2024-03-17 DIAGNOSIS — Z5111 Encounter for antineoplastic chemotherapy: Secondary | ICD-10-CM | POA: Insufficient documentation

## 2024-03-17 DIAGNOSIS — C9000 Multiple myeloma not having achieved remission: Secondary | ICD-10-CM

## 2024-03-17 DIAGNOSIS — C61 Malignant neoplasm of prostate: Secondary | ICD-10-CM | POA: Diagnosis present

## 2024-03-17 DIAGNOSIS — C7951 Secondary malignant neoplasm of bone: Secondary | ICD-10-CM | POA: Insufficient documentation

## 2024-03-17 DIAGNOSIS — Z79899 Other long term (current) drug therapy: Secondary | ICD-10-CM | POA: Diagnosis not present

## 2024-03-17 DIAGNOSIS — E876 Hypokalemia: Secondary | ICD-10-CM

## 2024-03-17 LAB — CBC WITH DIFFERENTIAL/PLATELET
Abs Immature Granulocytes: 0 10*3/uL (ref 0.00–0.07)
Band Neutrophils: 4 %
Basophils Absolute: 0 10*3/uL (ref 0.0–0.1)
Basophils Relative: 0 %
Eosinophils Absolute: 0.2 10*3/uL (ref 0.0–0.5)
Eosinophils Relative: 9 %
HCT: 31.8 % — ABNORMAL LOW (ref 39.0–52.0)
Hemoglobin: 10.6 g/dL — ABNORMAL LOW (ref 13.0–17.0)
Lymphocytes Relative: 28 %
Lymphs Abs: 0.5 10*3/uL — ABNORMAL LOW (ref 0.7–4.0)
MCH: 31.3 pg (ref 26.0–34.0)
MCHC: 33.3 g/dL (ref 30.0–36.0)
MCV: 93.8 fL (ref 80.0–100.0)
Monocytes Absolute: 0 10*3/uL — ABNORMAL LOW (ref 0.1–1.0)
Monocytes Relative: 2 %
Neutro Abs: 1.2 10*3/uL — ABNORMAL LOW (ref 1.7–7.7)
Neutrophils Relative %: 57 %
Platelets: 111 10*3/uL — ABNORMAL LOW (ref 150–400)
RBC: 3.39 MIL/uL — ABNORMAL LOW (ref 4.22–5.81)
RDW: 15.5 % (ref 11.5–15.5)
Smear Review: DECREASED
WBC: 1.9 10*3/uL — ABNORMAL LOW (ref 4.0–10.5)
nRBC: 0 % (ref 0.0–0.2)
nRBC: 1 /100{WBCs} — ABNORMAL HIGH

## 2024-03-17 LAB — COMPREHENSIVE METABOLIC PANEL WITH GFR
ALT: 30 U/L (ref 0–44)
AST: 35 U/L (ref 15–41)
Albumin: 3.3 g/dL — ABNORMAL LOW (ref 3.5–5.0)
Alkaline Phosphatase: 31 U/L — ABNORMAL LOW (ref 38–126)
Anion gap: 8 (ref 5–15)
BUN: 15 mg/dL (ref 8–23)
CO2: 25 mmol/L (ref 22–32)
Calcium: 8.2 mg/dL — ABNORMAL LOW (ref 8.9–10.3)
Chloride: 104 mmol/L (ref 98–111)
Creatinine, Ser: 1.15 mg/dL (ref 0.61–1.24)
GFR, Estimated: >60 mL/min (ref >60)
Glucose, Bld: 97 mg/dL (ref 70–99)
Potassium: 3.4 mmol/L — ABNORMAL LOW (ref 3.5–5.1)
Sodium: 137 mmol/L (ref 135–145)
Total Bilirubin: 0.6 mg/dL (ref 0.0–1.2)
Total Protein: 6.2 g/dL — ABNORMAL LOW (ref 6.5–8.1)

## 2024-03-17 LAB — MAGNESIUM: Magnesium: 2 mg/dL (ref 1.7–2.4)

## 2024-03-17 LAB — HEMOGLOBIN A1C
Hgb A1c MFr Bld: 5.6 % (ref 4.8–5.6)
Mean Plasma Glucose: 114.02 mg/dL

## 2024-03-17 LAB — T4, FREE: Free T4: 0.75 ng/dL (ref 0.61–1.12)

## 2024-03-17 LAB — TSH: TSH: 2.381 u[IU]/mL (ref 0.350–4.500)

## 2024-03-17 MED ORDER — DENOSUMAB 120 MG/1.7ML ~~LOC~~ SOLN
120.0000 mg | Freq: Once | SUBCUTANEOUS | Status: AC
  Administered 2024-03-17: 120 mg via SUBCUTANEOUS
  Filled 2024-03-17: qty 1.7

## 2024-03-17 MED ORDER — HEPARIN SOD (PORK) LOCK FLUSH 100 UNIT/ML IV SOLN
500.0000 [IU] | Freq: Once | INTRAVENOUS | Status: AC
  Administered 2024-03-17: 500 [IU] via INTRAVENOUS

## 2024-03-17 MED ORDER — LEUPROLIDE ACETATE (6 MONTH) 45 MG ~~LOC~~ KIT
45.0000 mg | PACK | Freq: Once | SUBCUTANEOUS | Status: AC
  Administered 2024-03-17: 45 mg via SUBCUTANEOUS
  Filled 2024-03-17: qty 45

## 2024-03-17 MED ORDER — SODIUM CHLORIDE 0.9% FLUSH
10.0000 mL | Freq: Once | INTRAVENOUS | Status: AC
  Administered 2024-03-17: 10 mL via INTRAVENOUS

## 2024-03-17 MED ORDER — POTASSIUM CHLORIDE CRYS ER 20 MEQ PO TBCR
40.0000 meq | EXTENDED_RELEASE_TABLET | Freq: Once | ORAL | Status: AC
  Administered 2024-03-17: 40 meq via ORAL
  Filled 2024-03-17: qty 2

## 2024-03-17 NOTE — Patient Instructions (Signed)
 [facility] [place] - A DEPT OF [facility]  Discharge Instructions: Thank you for choosing [facility] to provide your oncology and hematology care.  If you have a lab appointment with the Cancer Center - please note that after April 8th, 2024, all labs will be drawn in the cancer center.  You do not have to check in or register with the main entrance as you have in the past but will complete your check-in in the cancer center.  Wear comfortable clothing and clothing appropriate for easy access to any Portacath or PICC line.   We strive to give you quality time with your provider. You may need to reschedule your appointment if you arrive late (15 or more minutes).  Arriving late affects you and other patients whose appointments are after yours.  Also, if you miss three or more appointments without notifying the office, you may be dismissed from the clinic at the provider's discretion.      For prescription refill requests, have your pharmacy contact our office and allow 72 hours for refills to be completed.    Today you received the following chemotherapy and/or immunotherapy agents port flush lab      To help prevent nausea and vomiting after your treatment, we encourage you to take your nausea medication as directed.  BELOW ARE SYMPTOMS THAT SHOULD BE REPORTED IMMEDIATELY: *FEVER GREATER THAN 100.4 F (38 C) OR HIGHER *CHILLS OR SWEATING *NAUSEA AND VOMITING THAT IS NOT CONTROLLED WITH YOUR NAUSEA MEDICATION *UNUSUAL SHORTNESS OF BREATH *UNUSUAL BRUISING OR BLEEDING *URINARY PROBLEMS (pain or burning when urinating, or frequent urination) *BOWEL PROBLEMS (unusual diarrhea, constipation, pain near the anus) TENDERNESS IN MOUTH AND THROAT WITH OR WITHOUT PRESENCE OF ULCERS (sore throat, sores in mouth, or a toothache) UNUSUAL RASH, SWELLING OR PAIN  UNUSUAL VAGINAL DISCHARGE OR ITCHING   Items with * indicate a potential emergency and should be  followed up as soon as possible or go to the Emergency Department if any problems should occur.  Please show the CHEMOTHERAPY ALERT CARD or IMMUNOTHERAPY ALERT CARD at check-in to the Emergency Department and triage nurse.  Should you have questions after your visit or need to cancel or reschedule your appointment, please contact [facility] [place] - A DEPT OF [facility] [phone]  and follow the prompts.  Office hours are 8:00 a.m. to 4:30 p.m. Monday - Friday. Please note that voicemails left after 4:00 p.m. may not be returned until the following business day.  We are closed weekends and major holidays. You have access to a nurse at all times for urgent questions. Please call the main number to the clinic [phone] and follow the prompts.  For any non-urgent questions, you may also contact your provider using MyChart. We now offer e-Visits for anyone 1 and older to request care online for non-urgent symptoms. For details visit [email or web address].   Also download the MyChart app! Go to the app store, search "MyChart", open the app, select [place], and log in with your MyChart username and password.

## 2024-03-17 NOTE — Progress Notes (Signed)
 Patient presents today for Xgeva and Eligard. Potassium 3.4, 40 mEq of potassium given per standing orders.  Patient taking calcium as directed. Denied tooth, jaw, and leg pain. No recent or upcoming dental visits. Labs reviewed. Patient tolerated injections with no complaints voiced. See MAR for details. Patient stable during and after injection. Site clean and dry with no bruising or swelling noted. Band aid applied. Vss with discharge and left in satisfactory condition with no s/s of distress.

## 2024-03-17 NOTE — Progress Notes (Signed)
 Patients port flushed without difficulty.  Good blood return noted with no bruising or swelling noted at site.  Band aid applied.  VSS with discharge and left in satisfactory condition with no s/s of distress noted.

## 2024-03-17 NOTE — Patient Instructions (Signed)
 [facility] [place] - A DEPT OF [facility]  Discharge Instructions: Thank you for choosing [facility] to provide your oncology and hematology care.  If you have a lab appointment with the Cancer Center - please note that after April 8th, 2024, all labs will be drawn in the cancer center.  You do not have to check in or register with the main entrance as you have in the past but will complete your check-in in the cancer center.  Wear comfortable clothing and clothing appropriate for easy access to any Portacath or PICC line.   We strive to give you quality time with your provider. You may need to reschedule your appointment if you arrive late (15 or more minutes).  Arriving late affects you and other patients whose appointments are after yours.  Also, if you miss three or more appointments without notifying the office, you may be dismissed from the clinic at the provider's discretion.      For prescription refill requests, have your pharmacy contact our office and allow 72 hours for refills to be completed.    Today you received the following chemotherapy and/or immunotherapy agents Eligard, Xgeva and potassium chloride, return as scheduled.   To help prevent nausea and vomiting after your treatment, we encourage you to take your nausea medication as directed.  BELOW ARE SYMPTOMS THAT SHOULD BE REPORTED IMMEDIATELY: *FEVER GREATER THAN 100.4 F (38 C) OR HIGHER *CHILLS OR SWEATING *NAUSEA AND VOMITING THAT IS NOT CONTROLLED WITH YOUR NAUSEA MEDICATION *UNUSUAL SHORTNESS OF BREATH *UNUSUAL BRUISING OR BLEEDING *URINARY PROBLEMS (pain or burning when urinating, or frequent urination) *BOWEL PROBLEMS (unusual diarrhea, constipation, pain near the anus) TENDERNESS IN MOUTH AND THROAT WITH OR WITHOUT PRESENCE OF ULCERS (sore throat, sores in mouth, or a toothache) UNUSUAL RASH, SWELLING OR PAIN  UNUSUAL VAGINAL DISCHARGE OR ITCHING   Items with * indicate  a potential emergency and should be followed up as soon as possible or go to the Emergency Department if any problems should occur.  Please show the CHEMOTHERAPY ALERT CARD or IMMUNOTHERAPY ALERT CARD at check-in to the Emergency Department and triage nurse.  Should you have questions after your visit or need to cancel or reschedule your appointment, please contact [facility] [place] - A DEPT OF [facility] [phone]  and follow the prompts.  Office hours are 8:00 a.m. to 4:30 p.m. Monday - Friday. Please note that voicemails left after 4:00 p.m. may not be returned until the following business day.  We are closed weekends and major holidays. You have access to a nurse at all times for urgent questions. Please call the main number to the clinic [phone] and follow the prompts.  For any non-urgent questions, you may also contact your provider using MyChart. We now offer e-Visits for anyone 47 and older to request care online for non-urgent symptoms. For details visit [email or web address].   Also download the MyChart app! Go to the app store, search "MyChart", open the app, select , and log in with your MyChart username and password.

## 2024-03-18 ENCOUNTER — Other Ambulatory Visit: Payer: Self-pay | Admitting: Hematology

## 2024-03-18 ENCOUNTER — Other Ambulatory Visit: Payer: Self-pay | Admitting: Internal Medicine

## 2024-03-18 DIAGNOSIS — M48062 Spinal stenosis, lumbar region with neurogenic claudication: Secondary | ICD-10-CM

## 2024-03-18 DIAGNOSIS — G609 Hereditary and idiopathic neuropathy, unspecified: Secondary | ICD-10-CM

## 2024-03-18 DIAGNOSIS — C9000 Multiple myeloma not having achieved remission: Secondary | ICD-10-CM

## 2024-03-18 LAB — KAPPA/LAMBDA LIGHT CHAINS
Kappa free light chain: 18.3 mg/L (ref 3.3–19.4)
Kappa, lambda light chain ratio: 2.1 — ABNORMAL HIGH (ref 0.26–1.65)
Lambda free light chains: 8.7 mg/L (ref 5.7–26.3)

## 2024-03-18 NOTE — Telephone Encounter (Signed)
 Chart reviewed. Revlimid refilled per last office note with Dr. Ellin Saba.

## 2024-03-21 LAB — PROTEIN ELECTROPHORESIS, SERUM
A/G Ratio: 1.3 (ref 0.7–1.7)
Albumin ELP: 3.1 g/dL (ref 2.9–4.4)
Alpha-1-Globulin: 0.3 g/dL (ref 0.0–0.4)
Alpha-2-Globulin: 0.7 g/dL (ref 0.4–1.0)
Beta Globulin: 0.8 g/dL (ref 0.7–1.3)
Gamma Globulin: 0.6 g/dL (ref 0.4–1.8)
Globulin, Total: 2.4 g/dL (ref 2.2–3.9)
M-Spike, %: 0.3 g/dL — ABNORMAL HIGH
Total Protein ELP: 5.5 g/dL — ABNORMAL LOW (ref 6.0–8.5)

## 2024-03-22 ENCOUNTER — Encounter: Payer: Self-pay | Admitting: Nurse Practitioner

## 2024-03-22 ENCOUNTER — Ambulatory Visit (HOSPITAL_COMMUNITY)
Admission: RE | Admit: 2024-03-22 | Discharge: 2024-03-22 | Disposition: A | Discharge status: Home / Self Care | Source: Ambulatory Visit | Attending: Nurse Practitioner | Admitting: Nurse Practitioner

## 2024-03-22 ENCOUNTER — Ambulatory Visit: Payer: Medicare HMO | Attending: Nurse Practitioner | Admitting: Nurse Practitioner

## 2024-03-22 VITALS — BP 108/60 | HR 60 | Ht 74.0 in | Wt 190.0 lb

## 2024-03-22 DIAGNOSIS — E785 Hyperlipidemia, unspecified: Secondary | ICD-10-CM | POA: Diagnosis not present

## 2024-03-22 DIAGNOSIS — R0989 Other specified symptoms and signs involving the circulatory and respiratory systems: Secondary | ICD-10-CM | POA: Insufficient documentation

## 2024-03-22 DIAGNOSIS — Z8546 Personal history of malignant neoplasm of prostate: Secondary | ICD-10-CM

## 2024-03-22 DIAGNOSIS — R001 Bradycardia, unspecified: Secondary | ICD-10-CM

## 2024-03-22 DIAGNOSIS — I491 Atrial premature depolarization: Secondary | ICD-10-CM | POA: Diagnosis not present

## 2024-03-22 DIAGNOSIS — I471 Supraventricular tachycardia, unspecified: Secondary | ICD-10-CM

## 2024-03-22 DIAGNOSIS — R059 Cough, unspecified: Secondary | ICD-10-CM | POA: Diagnosis not present

## 2024-03-22 DIAGNOSIS — C9000 Multiple myeloma not having achieved remission: Secondary | ICD-10-CM | POA: Diagnosis not present

## 2024-03-22 DIAGNOSIS — Z77090 Contact with and (suspected) exposure to asbestos: Secondary | ICD-10-CM | POA: Diagnosis not present

## 2024-03-22 DIAGNOSIS — E876 Hypokalemia: Secondary | ICD-10-CM

## 2024-03-22 DIAGNOSIS — R051 Acute cough: Secondary | ICD-10-CM

## 2024-03-22 DIAGNOSIS — Z79899 Other long term (current) drug therapy: Secondary | ICD-10-CM | POA: Diagnosis not present

## 2024-03-22 DIAGNOSIS — R062 Wheezing: Secondary | ICD-10-CM | POA: Diagnosis not present

## 2024-03-22 LAB — IMMUNOFIXATION ELECTROPHORESIS
IgA: 32 mg/dL — ABNORMAL LOW (ref 61–437)
IgG (Immunoglobin G), Serum: 703 mg/dL (ref 603–1613)
IgM (Immunoglobulin M), Srm: 16 mg/dL (ref 15–143)
Total Protein ELP: 5.5 g/dL — ABNORMAL LOW (ref 6.0–8.5)

## 2024-03-22 MED ORDER — POTASSIUM CHLORIDE ER 10 MEQ PO TBCR: 10.0000 meq | EXTENDED_RELEASE_TABLET | Freq: Every day | ORAL | 1 refills | Status: DC

## 2024-03-22 NOTE — Progress Notes (Signed)
 Cardiology Office Note:    Date: 03/22/2024 ID:  [name], DOB 1951/05/06, MRN [id number] PCP:  [name], MD  [facility] Providers Cardiologist:  [name], MD    Referring MD: [name], MD   CC: Here for follow-up  History of Present Illness:    Ostin Mathey is a 73 y.o. male with a PMH of atypical chest pain, coronary artery calcification, HLD, hx of PE, hx of GIB while on Xarelto, hx of asbestos exposure (follows Pulmonology), hx of prostate cancer, Multiple Myeloma (Dx in March 2023, follows Hem/Onc), and GERD, who presnts today for scheduled follow-up.   Last seen by Dr. [name] on June 03, 2023. Was overall doing well from a cardiac perspective. Was being considered for possible stem cell transplantation.   12/25/2023 - He presents today for possible A-fib evaluation. He says he is here because his Smart Watch device has alerted him to irregular heart beats. Shows me his Smart Watch that reveals recent HR ranging from 33 - 180 with average HR at 58 bpm. Telephone encounter on 12/24/2023 shows that pt was at [facility] and an EKG showed A-fib, however later on this was clarified that this was noted via his Smart Watch. Denies any palpitations. Recent EKG he has performed at home reveals SR/SA that I have reviewed. Admits to chronic atypical chest pain, pt believes this is indigestion related and describes it as an intermittent ache/dull, stable over time. Says Protonix and having a BM help relieve this. Denies any aggravating factors, rates pain as 4 or 5 on 0-10 pains scale. Denies any shortness of breath, palpitations, syncope, presyncope, dizziness, orthopnea, PND, swelling or significant weight changes, acute bleeding, or claudication.  02/08/2024 - Presents today for follow-up. Continues to monitor his heart rate on his Smart Watch and has noticed some lower HR with HR averaging upper 30's to 90's. Denies any chest pain,  shortness of breath, palpitations, syncope, presyncope, dizziness, orthopnea, PND, swelling or significant weight changes, acute bleeding, or claudication.  03/22/2024  - today presents for follow-up.  He says he is doing well from a cardiac standpoint.  Does admit to cough and some chest congestion.  Admits to sinus/allergy issues.  Says his cough has been ongoing for few weeks. Denies any chest pain, shortness of breath, palpitations, syncope, presyncope, dizziness, orthopnea, PND, swelling or significant weight changes, acute bleeding, or claudication.  Please see the history of present illness.    All other systems reviewed and are negative.  EKGs/Labs/Other Studies Reviewed:    The following studies were reviewed today:   EKG: EKG is not ordered today.  Cardiac monitor 12/2023:  ZIO monitor reviewed.  13 days, 23 hours analyzed.   Predominant rhythm is sinus with prolonged PR interval, heart rate ranging from 33 bpm up to 121 bpm and average heart rate 57 bpm. There were frequent PACs representing 10.2% total beats with otherwise occasional atrial couplets and triplets. There were rare PVCs including ventricular couplets representing less than 1% total beats.  Also limited ventricular bigeminy. Multiple (949) brief episodes of PSVT were noted, the longest of which was 10.6 seconds. Single episode of limited high degree heart block noted on January 6 at 12:21 PM, heart rate in the 30s and with junctional escape. No pauses.  2D Echocardiogram on November 14, 2022 ([facility]):   LVEF 55 to 60%, no RWMA, mild MR, mild TR, all other findings normal.  Coronary CTA on October 29, 2018: 1.  Coronary artery calcium score 0. 2.  No significant disease noted in coronary arteries. 3.  No acute or clinically significant findings in the extracardiac anatomy.   Myoview on November 04, 2017: There was no ST segment deviation noted during stress. The study is normal. No myocardial ischemia or  scar. This is a low risk study. Nuclear stress EF: 67%.  Vascular ultrasound lower extremity Doppler on July 17, 2017: No evidence acute or chronic DVT within either lower extremity with special attention paid to the right posterior tibial and peroneal veins.  Vascular ultrasound lower extremity Doppler on June 18, 2017: Occlusive thrombus within the right calf posterior tibial and peroneal veins.  Myoview on October 05, 2015: No diagnostic ST segment changes by standard criteria. Adequate heart rate response noted with sinus rhythm throughout. Ectopic atrial rhythm noted at the end of recovery. Blood pressure demonstrated a hypertensive response to exercise. Perfusion imaging is most consistent with attenuation artifact in the inferolateral wall on rest imaging. No clear evidence of scar or ischemia. This is a low risk study. Nuclear stress EF: 67%.   Physical Exam:    VS:  BP 108/60   Pulse 60   Ht 6\' 2"  (1.88 m)   Wt 190 lb (86.2 kg)   SpO2 98%   BMI 24.39 kg/m     Wt Readings from Last 3 Encounters:  03/22/24 190 lb (86.2 kg)  03/17/24 191 lb 2.2 oz (86.7 kg)  03/15/24 192 lb 12.8 oz (87.5 kg)    GEN: Well nourished, well developed in no acute distress HEENT: Normal NECK: No JVD; No carotid bruits CARDIAC: S1/S2, RRR, no murmurs, rubs, gallops; 2+ peripheral pulses throughout, strong and equal bilaterally RESPIRATORY:  Diminished with scattered rhonchi to auscultation without rales or wheezing  MUSCULOSKELETAL:  No edema; No deformity  SKIN: Warm and dry NEUROLOGIC:  Alert and oriented x 3 PSYCHIATRIC:  Normal affect   ASSESSMENT & PLAN:    In order of problems listed above:  1. PSVT, Frequent PAC's, bradycardia See past monitor report noted above. SR on exam today.  Denies any tachycardia or palpitations.  Continue to follow with PCP. Care and ED precautions discussed.   2. Hyperlipidemia LDL from February 2025 was 57. Continue Crestor. Heart healthy diet and  regular cardiovascular exercise encouraged. Continue to follow with PCP.  3. Asbestos exposure Past history of exposure to asbestos.  Continue to follow-up with pulmonology and PCP.  4. Multiple myeloma and history of prostate cancer Continue to follow-up with oncology and urology.   5.  Acute cough, abnormal breath sounds He attributes this to sinus/allergies.  Cough has been ongoing for several weeks.  Concerned that he may have upper respiratory infection/possible pneumonia.  See physical exam findings above.  Arranging two-view chest x-ray for further evaluation.  6.  Hypokalemia, medication management  Recent labs revealed potassium level 3.4.  He has had a history of minimally depleted potassium levels in the past.  Will start potassium chloride 10 mill equivalent daily and recheck BMET at next visit with Hem/Onc.     Disposition: Follow-up with Dr. [name] or APP in 6 months or sooner if anything changes.  Medication Adjustments/Labs and Tests Ordered: Current medicines are reviewed at length with the patient today.  Concerns regarding medicines are outlined above.  Orders Placed This Encounter  Procedures   DG Chest 2 View   Basic Metabolic Panel (BMET)   Meds ordered this encounter  Medications   potassium chloride (KLOR-CON) 10 MEQ  tablet    Sig: Take 1 tablet (10 mEq total) by mouth daily.    Dispense:  90 tablet    Refill:  1    New 03/22/24    Patient Instructions  Medication Instructions:  Your physician has recommended you make the following change in your medication:  Please start Potassium Chloride at 10 MeQ daily   Labwork: Please have given lab work done at your upcoming lab appointment.   Testing/Procedures: A chest x-ray takes a picture of the organs and structures inside the chest, including the heart, lungs, and blood vessels. This test can show several things, including, whether the heart is enlarges; whether fluid is building up in the lungs; and  whether pacemaker / defibrillator leads are still in place.  Follow-Up: Your physician recommends that you schedule a follow-up appointment in: 6 months   Any Other Special Instructions Will Be Listed Below (If Applicable).  If you need a refill on your cardiac medications before your next appointment, please call your pharmacy.   Signed, [name], NP

## 2024-03-22 NOTE — Patient Instructions (Addendum)
 Medication Instructions:  Your physician has recommended you make the following change in your medication:  Please start Potassium Chloride at 10 MeQ daily   Labwork: Please have given lab work done at your upcoming lab appointment.   Testing/Procedures: A chest x-ray takes a picture of the organs and structures inside the chest, including the heart, lungs, and blood vessels. This test can show several things, including, whether the heart is enlarges; whether fluid is building up in the lungs; and whether pacemaker / defibrillator leads are still in place.  Follow-Up: Your physician recommends that you schedule a follow-up appointment in: 6 months   Any Other Special Instructions Will Be Listed Below (If Applicable).  If you need a refill on your cardiac medications before your next appointment, please call your pharmacy.

## 2024-03-23 NOTE — Progress Notes (Signed)
 [facility] [address] [place] [address]    Clinic Day:  03/24/24   Referring physician: [name], MD  Patient Care Team: [name], MD as PCP - General (Internal Medicine) Jonelle Sidle, MD as PCP - Cardiology (Cardiology) Jena Gauss Gerrit Friends, MD as Consulting Physician (Gastroenterology) Doreatha Massed, MD as Medical Oncologist (Medical Oncology) Therese Sarah, RN as Oncology Nurse Navigator (Medical Oncology) Clinton Gallant, RN as [facility] Care Management   ASSESSMENT & PLAN:   Assessment: 1. Prostate cancer: - TRUS/Bx on 06/14/2015: 1/12 cores positive for adenocarcinoma-10% of the core in the right lateral base revealed Gleason 3+3=6.  Prostate volume 175 cc.  PSA was 16.6.  PSA density 0.09. - Seen by Dr. Marlou Porch in [facility] for robotic prostatectomy.  Due to low-volume prostate cancer and very low PSA density, it was recommended that he continue active surveillance. - 01/21/2016: Surveillance TRUS/biopsy, following a prostatic MRI which revealed no suspicious prostatic lesions and correlated the patient's large prostate.  All 12 cores were negative for prostate cancer.  Prostate volume was 190 mL. - 08/11/2018: Fusion TRUS/biopsy: Recent prostate MRI-volume 220 mL, no significant lesions.  No evidence of transcapsular/.  Ureteral, SV/bony or lymphatic disease.  Path-1 core (left apex) revealed Gleason 3+3 and 5% of core. - PSA: 17.16 (10/09/2015), 15.4 (11/04/2016), 17.9 (05/15/2017), 18.4 (04/19/2018), 15.9 (02/15/2019), 15.5 (03/20/2020), 22.4 (03/20/2021) - MRI of the lumbar spine with and without contrast on 11/27/2021 done for back pain showed expanded appearance of the sacrum with diffusely abnormal bone marrow signal and contrast-enhancement concerning for metastatic disease. - CT pelvis with contrast on 11/27/2021: Expansile, heterogeneous trabeculated appearance of the sacrum with thickening of the cortex,  unchanged from multiple prior exams.  No evidence of pelvic lymphadenopathy.  Extreme prostatomegaly. -PSMA PET scan (03/13/2022): Markedly enlarged prostate gland with focal activity inferior left aspect of the gland suggesting prostatic adenocarcinoma.  No nodal metastasis in the abdomen or pelvis.  Multifocal skeletal metastasis in the mid sacrum, right sacral ala, subtle lesions in the L2 and several rib lesions. - Degarelix started on 04/15/2022   2. Social/family history: - Lives at home with his wife.  Retired in 2003 from [employer].  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  Non-smoker. - Sister had cholangiocarcinoma.  Paternal first cousin had metastatic cancer.  Paternal uncle had prostate cancer.  3.  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  Core biopsy demonstrates hypercellular marrow (95%) with large atypical plasma cells arranged in sheets, by CD138 IHC plasma cells comprise 70% of the cellular elements, kappa restricted.  Amyloid was negative. 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  Chronic progressive changes involving the sacrum, left iliac bone including accentuation of trabecular markings and cortical thickening with low FDG uptake suggestive of chronic Paget's disease.  Marked prostate gland enlargement. - BMBX (03/20/2022):Hypercellular bone marrow with at least 25% plasma cells on aspirate with atypical features.  Core biopsy demonstrates hypercellular marrow for age (95%) with large atypical plasma cells arranged in sheets.  By CD138 IHC, plasma cells comprise approximately 70% of cellular elements and are kappa restricted.  Amyloid deposition was  negative  by Congo red.  Karyotype showed 20 q. deletion.  Myeloma FISH panel showed complex abnormalities. - Dara KRD started on 04/15/2022, 8 cycles completed. - Stem cell collection was completed at [facility].  Patient decided to do transplant at first relapse.    Plan: 1. 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  He has hot flashes occasionally. - PSA improved to 0.54.  Will repeat PSA at next visit.  2.  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  He has 5-6 more tablets left in his bottle.  Occasional diarrhea on and off over the weekend see stable. - Reviewed myeloma labs from 03/17/2024: M spike slightly increased to 0.3 g from 0.2 g previously.  FLC ratio also increased to 2.1 with normal kappa light chains. - White count 1.9 with ANC of 1.2.  Platelet count mildly low at 111.  Renal function is normal. - Even though M spike slightly increased, will continue Revlimid 15 mg 3 weeks on/1 week off at this time.  Repeat myeloma panel in 8 weeks.  If it progresses, consider adding daratumumab.  3.  Normocytic anemia: - Last colonoscopy on 09/25/2023: Tubular adenoma at hepatic flexure removed. - Hemoglobin today is 10.6.  Continue iron tablet 4 to 5 days/week.  Will repeat ferritin and iron panel next visit.  4.  Osteopenia (DEXA scan 06/04/2022: T score -2.1): - Calcium is 8.2 with albumin 3.3.  Continue denosumab monthly.    Orders Placed This Encounter  Procedures   PSA    Standing Status:   Future    Expected Date:   06/23/2024    Expiration Date:   03/24/2025   Ferritin    Standing Status:   Future    Expected Date:   05/17/2024    Expiration Date:   03/24/2025      Mikeal Hawthorne R [name],acting as a scribe for Doreatha Massed, MD.,have documented all relevant documentation on the behalf of Doreatha Massed, MD,as directed by  Doreatha Massed, MD while in the  presence of Doreatha Massed, MD.  I, Doreatha Massed MD, have reviewed the above documentation for accuracy and completeness, and I agree with the above.      Doreatha Massed, MD   4/3/202512:40 PM  CHIEF COMPLAINT:   Diagnosis: prostate cancer and multiple myeloma    Cancer Staging  Multiple myeloma (HCC) Staging form: Plasma Cell Myeloma and Plasma Cell Disorders, AJCC 8th Edition - Clinical: No stage assigned - Unsigned    Prior Therapy: Carfilzomib (20/70) + Daratumumab SQ + Dexamethasone (20/40) DaraKRd q28d   Current Therapy:  Eligard; Revlimid 15 mg 3 weeks on/1 week of    HISTORY OF PRESENT ILLNESS:   Oncology History  Multiple myeloma (HCC)  04/09/2022 Initial Diagnosis   Multiple myeloma (HCC)   04/15/2022 - 11/11/2022 Chemotherapy   Patient is on Treatment Plan : MYELOMA RELAPSED/REFRACTORY Carfilzomib (20/70) + Daratumumab SQ + Dexamethasone (20/40) DaraKd q28d        INTERVAL HISTORY:   Darreld is a 73 y.o. male presenting to clinic today for follow up of prostate cancer and multiple myeloma. He was last seen by me on 12/31/23.  Today, he states that he is doing well overall. His appetite level is at 60%. His energy level is at 60%.   PAST MEDICAL HISTORY:   Past Medical History: Past Medical History:  Diagnosis Date   Arthritis    Asbestosis (  HCC)    Closed fracture of distal end of fibula with tibia with routine healing 05/28/2020   COVID-19 09/03/2021   Cyst of kidney, acquired    Cystic disease of liver    Enlarged prostate    Headache    History of fracture of leg    Right, childhood   Hx of migraine headaches    Hyperlipidemia    Multiple myeloma (HCC)    Neuropathy    PE (pulmonary thromboembolism) (HCC)    After knee surgery   Prostate cancer (HCC) 2015   Adenocarcinoma by biopsy    Surgical History: Past Surgical History:  Procedure Laterality Date   BIOPSY PROSTATE  2003 and 2005   COLONOSCOPY  11/28/2008   Dr.  Rourk:internal hemorrhoids/tortus colon/ascending colon polyps, tubular adenomas   COLONOSCOPY N/A 09/04/2014   Procedure: COLONOSCOPY;  Surgeon: Corbin Ade, MD;  [facility];  Service: Endoscopy;  Laterality: N/A;  9:30   COLONOSCOPY N/A 07/17/2017   Procedure: COLONOSCOPY;  Surgeon: West Bali, MD;  [facility];  Service: Endoscopy;  Laterality: N/A;   COLONOSCOPY WITH PROPOFOL N/A 10/05/2023   Procedure: COLONOSCOPY WITH PROPOFOL;  Surgeon: Corbin Ade, MD;  [facility];  Service: Endoscopy;  Laterality: N/A;  10:00AM, ASA 3   ESOPHAGOGASTRODUODENOSCOPY N/A 07/16/2017   Procedure: ESOPHAGOGASTRODUODENOSCOPY (EGD);  Surgeon: West Bali, MD;  [facility];  Service: Endoscopy;  Laterality: N/A;   GIVENS CAPSULE STUDY  07/16/2017   Procedure: GIVENS CAPSULE STUDY;  Surgeon: West Bali, MD;  [facility];  Service: Endoscopy;;   JOINT REPLACEMENT N/A    Phreesia 01/12/2021   POLYPECTOMY  07/17/2017   Procedure: POLYPECTOMY;  Surgeon: West Bali, MD;  [facility];  Service: Endoscopy;;  cecal   POLYPECTOMY  10/05/2023   Procedure: POLYPECTOMY;  Surgeon: Corbin Ade, MD;  [facility];  Service: Endoscopy;;   PORTACATH PLACEMENT Left 04/18/2022   Procedure: INSERTION PORT-A-CATH;  Surgeon: Franky Macho, MD;  Location: [facility];  Service: General;  Laterality: Left;   TOTAL KNEE ARTHROPLASTY Right 06/10/2017   TOTAL KNEE ARTHROPLASTY Right 06/10/2017   Procedure: TOTAL KNEE ARTHROPLASTY;  Surgeon: Loreta Ave, MD;  Location: [facility];  Service: Orthopedics;  Laterality: Right;    Social History: Social History   Socioeconomic History   Marital status: Married    Spouse name: Marvia Pickles   Number of children: Not on file   Years of education: Not on file   Highest education level: Not on file  Occupational History   Occupation: Retired  Tobacco Use   Smoking status: Never   Smokeless  tobacco: Never  Vaping Use   Vaping status: Never Used  Substance and Sexual Activity   Alcohol use: No   Drug use: No   Sexual activity: Yes  Other Topics Concern   Not on file  Social History Narrative   Previously worked at [employer]   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   Social Drivers of Corporate investment banker Strain: Low Risk  (03/14/2024)   Overall Financial Resource Strain (CARDIA)    Difficulty of Paying Living Expenses: Not hard at all  Food Insecurity: No Food Insecurity (03/14/2024)   Hunger Vital Sign    Worried About Running Out of Food in the Last Year: Never true    Ran Out of Food in the Last  Year: Never true  Transportation Needs: No Transportation Needs (03/14/2024)   PRAPARE - Administrator, Civil Service (Medical): No    Lack of Transportation (Non-Medical): No  Physical Activity: Sufficiently Active (03/14/2024)   Exercise Vital Sign    Days of Exercise per Week: 7 days    Minutes of Exercise per Session: 30 min  Stress: No Stress Concern Present (03/14/2024)   [employer] of Occupational Health - Occupational Stress Questionnaire    Feeling of Stress : Not at all  Social Connections: Socially Integrated (03/14/2024)   Social Connection and Isolation Panel [NHANES]    Frequency of Communication with Friends and Family: More than three times a week    Frequency of Social Gatherings with Friends and Family: More than three times a week    Attends Religious Services: More than 4 times per year    Active Member of [employer] or Organizations: Yes    Attends Engineer, structural: More than 4 times per year    Marital Status: Married  Catering manager Violence: Not At Risk (03/14/2024)   Humiliation, Afraid, Rape, and Kick questionnaire    Fear of Current or Ex-Partner: No    Emotionally Abused: No    Physically Abused: No    Sexually Abused: No    Family History: Family History  Problem  Relation Age of Onset   Diabetes Mother    Hypertension Mother    Obesity Mother    Heart disease Mother    Mental illness Mother    Hypertension Sister    Obesity Sister    Arthritis Sister    Deep vein thrombosis Father    Dementia Father    Heart disease Father        CABG   Colon cancer Neg Hx     Current Medications:  Current Outpatient Medications:    acetaminophen (TYLENOL) 500 MG tablet, Take 1,000 mg by mouth every 6 (six) hours as needed for mild pain or moderate pain., Disp: , Rfl:    acyclovir (ZOVIRAX) 400 MG tablet, TAKE 1 TABLET BY MOUTH TWICE A DAY, Disp: 180 tablet, Rfl: 2   Cholecalciferol (VITAMIN D) 2000 units CAPS, Take 2,000 Units by mouth every other day., Disp: , Rfl:    DULoxetine (CYMBALTA) 60 MG capsule, TAKE 1 CAPSULE BY MOUTH EVERY DAY, Disp: 90 capsule, Rfl: 2   ferrous sulfate 325 (65 FE) MG EC tablet, Take 325 mg by mouth daily., Disp: , Rfl:    lenalidomide (REVLIMID) 15 MG capsule, TAKE 1 CAPSULE BY MOUTH 1 TIME A DAY FOR 21 DAYS ON THEN 7 DAYS OFF. REPEAT EVERY 28 DAYS., Disp: 21 capsule, Rfl: 0   Multiple Vitamin (MULTIVITAMIN WITH MINERALS) TABS tablet, Take 1 tablet by mouth daily with breakfast. Centrum Silver for Men, Disp: , Rfl:    nitroGLYCERIN (NITROSTAT) 0.4 MG SL tablet, Place 1 tablet (0.4 mg total) under the tongue every 5 (five) minutes x 3 doses as needed for chest pain (If no relief after 3rd dose, call or go to ED.)., Disp: 30 tablet, Rfl: 0   pantoprazole (PROTONIX) 40 MG tablet, TAKE 1 TABLET BY MOUTH EVERY OTHER DAY, Disp: 45 tablet, Rfl: 1   potassium chloride (KLOR-CON) 10 MEQ tablet, Take 1 tablet (10 mEq total) by mouth daily., Disp: 90 tablet, Rfl: 1   RESTASIS 0.05 % ophthalmic emulsion, Place 1 drop into both eyes 2 (two) times daily., Disp: , Rfl:    rosuvastatin (CRESTOR) 5 MG tablet,  Take 1 tablet (5 mg total) by mouth daily., Disp: 90 tablet, Rfl: 3   tamsulosin (FLOMAX) 0.4 MG CAPS capsule, Take 0.4 mg by mouth every  other day., Disp: , Rfl:    Allergies: Allergies  Allergen Reactions   Sulfa Antibiotics Other (See Comments)    Unknown reaction. Childhood reaction    REVIEW OF SYSTEMS:   Review of Systems  Constitutional:  Negative for chills, fatigue and fever.  HENT:   Negative for lump/mass, mouth sores, nosebleeds, sore throat and trouble swallowing.   Eyes:  Negative for eye problems.  Respiratory:  Positive for cough and shortness of breath.   Cardiovascular:  Negative for chest pain, leg swelling and palpitations.  Gastrointestinal:  Positive for diarrhea. Negative for abdominal pain, constipation, nausea and vomiting.  Genitourinary:  Negative for bladder incontinence, difficulty urinating, dysuria, frequency, hematuria and nocturia.   Musculoskeletal:  Negative for arthralgias, back pain, flank pain, myalgias and neck pain.  Skin:  Negative for itching and rash.  Neurological:  Negative for dizziness, headaches and numbness.  Hematological:  Does not bruise/bleed easily.  Psychiatric/Behavioral:  Positive for sleep disturbance. Negative for depression and suicidal ideas. The patient is not nervous/anxious.   All other systems reviewed and are negative.    VITALS:   There were no vitals taken for this visit.  Wt Readings from Last 3 Encounters:  03/22/24 190 lb (86.2 kg)  03/17/24 191 lb 2.2 oz (86.7 kg)  03/15/24 192 lb 12.8 oz (87.5 kg)    There is no height or weight on file to calculate BMI.  Performance status (ECOG): 1 - Symptomatic but completely ambulatory  PHYSICAL EXAM:   Physical Exam Vitals and nursing note reviewed. Exam conducted with a chaperone present.  Constitutional:      Appearance: Normal appearance.  Cardiovascular:     Rate and Rhythm: Normal rate and regular rhythm.     Pulses: Normal pulses.     Heart sounds: Normal heart sounds.  Pulmonary:     Effort: Pulmonary effort is normal.     Breath sounds: Normal breath sounds.  Abdominal:      Palpations: Abdomen is soft. There is no hepatomegaly, splenomegaly or mass.     Tenderness: There is no abdominal tenderness.  Musculoskeletal:     Right lower leg: No edema.     Left lower leg: No edema.  Lymphadenopathy:     Cervical: No cervical adenopathy.     Right cervical: No superficial, deep or posterior cervical adenopathy.    Left cervical: No superficial, deep or posterior cervical adenopathy.     Upper Body:     Right upper body: No supraclavicular or axillary adenopathy.     Left upper body: No supraclavicular or axillary adenopathy.  Neurological:     General: No focal deficit present.     Mental Status: He is alert and oriented to person, place, and time.  Psychiatric:        Mood and Affect: Mood normal.        Behavior: Behavior normal.     LABS:      Latest Ref Rng & Units 03/17/2024    9:19 AM 02/18/2024    9:22 AM 12/24/2023   11:11 AM  CBC  WBC 4.0 - 10.5 K/uL 1.9  2.9  2.4   Hemoglobin 13.0 - 17.0 g/dL 16.1  09.6  04.5   Hematocrit 39.0 - 52.0 % 31.8  33.6  34.9   Platelets 150 - 400 K/uL  111  142  118       Latest Ref Rng & Units 03/17/2024    9:19 AM 02/18/2024    9:22 AM 01/21/2024    1:19 PM  CMP  Glucose 70 - 99 mg/dL 97  90  87   BUN 8 - 23 mg/dL 15  17  16    Creatinine 0.61 - 1.24 mg/dL 1.61  0.96  0.45   Sodium 135 - 145 mmol/L 137  139  139   Potassium 3.5 - 5.1 mmol/L 3.4  3.5  3.7   Chloride 98 - 111 mmol/L 104  105  105   CO2 22 - 32 mmol/L 25  26  27    Calcium 8.9 - 10.3 mg/dL 8.2  8.4  8.7   Total Protein 6.5 - 8.1 g/dL 6.2  5.9  6.1   Total Bilirubin 0.0 - 1.2 mg/dL 0.6  0.8  0.9   Alkaline Phos 38 - 126 U/L 31  31  34   AST 15 - 41 U/L 35  22  22   ALT 0 - 44 U/L 30  20  21       No results found for: "CEA1", "CEA" / No results found for: "CEA1", "CEA" Lab Results  Component Value Date   PSA1 22.5 (H) 11/05/2021   No results found for: "WUJ811" No results found for: "CAN125"  Lab Results  Component Value Date    TOTALPROTELP 5.5 (L) 03/17/2024   TOTALPROTELP 5.5 (L) 03/17/2024   ALBUMINELP 3.1 03/17/2024   A1GS 0.3 03/17/2024   A2GS 0.7 03/17/2024   BETS 0.8 03/17/2024   GAMS 0.6 03/17/2024   MSPIKE 0.3 (H) 03/17/2024   SPEI Comment 03/17/2024   Lab Results  Component Value Date   TIBC 392 09/24/2023   TIBC 441 07/16/2023   TIBC 301 02/26/2022   FERRITIN 51 12/24/2023   FERRITIN 49 09/24/2023   FERRITIN 11 (L) 07/16/2023   IRONPCTSAT 15 (L) 09/24/2023   IRONPCTSAT 11 (L) 07/16/2023   IRONPCTSAT 42 (H) 02/26/2022   Lab Results  Component Value Date   LDH 142 10/21/2022   LDH 131 08/26/2022   LDH 124 07/29/2022     STUDIES:   No results found.

## 2024-03-24 ENCOUNTER — Inpatient Hospital Stay: Payer: Medicare HMO | Attending: Hematology | Admitting: Hematology

## 2024-03-24 DIAGNOSIS — D508 Other iron deficiency anemias: Secondary | ICD-10-CM | POA: Diagnosis not present

## 2024-03-24 DIAGNOSIS — C9000 Multiple myeloma not having achieved remission: Secondary | ICD-10-CM | POA: Diagnosis present

## 2024-03-24 DIAGNOSIS — Z8616 Personal history of COVID-19: Secondary | ICD-10-CM | POA: Diagnosis not present

## 2024-03-24 DIAGNOSIS — D649 Anemia, unspecified: Secondary | ICD-10-CM | POA: Insufficient documentation

## 2024-03-24 DIAGNOSIS — Z8042 Family history of malignant neoplasm of prostate: Secondary | ICD-10-CM | POA: Insufficient documentation

## 2024-03-24 DIAGNOSIS — C7951 Secondary malignant neoplasm of bone: Secondary | ICD-10-CM | POA: Diagnosis present

## 2024-03-24 DIAGNOSIS — Z800 Family history of malignant neoplasm of digestive organs: Secondary | ICD-10-CM | POA: Insufficient documentation

## 2024-03-24 DIAGNOSIS — Z9079 Acquired absence of other genital organ(s): Secondary | ICD-10-CM | POA: Diagnosis not present

## 2024-03-24 DIAGNOSIS — M8580 Other specified disorders of bone density and structure, unspecified site: Secondary | ICD-10-CM | POA: Diagnosis not present

## 2024-03-24 DIAGNOSIS — C61 Malignant neoplasm of prostate: Secondary | ICD-10-CM | POA: Diagnosis not present

## 2024-03-24 NOTE — Patient Instructions (Addendum)
 [facility] at [facility] Discharge Instructions   You were seen and examined today by Dr. Ellin Saba.  He reviewed the results of your lab work which are normal/stable. Your m-spike went up slightly from 0.2 to 0.3. Your light chains are also slightly elevated from last time. Per Dr. Kirtland Bouchard, this is not concerning at this time.   Continue Revlimid as prescribed.   Return as scheduled.    Thank you for choosing [facility] at [facility] to provide your oncology and hematology care.  To afford each patient quality time with our provider, please arrive at least 15 minutes before your scheduled appointment time.   If you have a lab appointment with the Cancer Center please come in thru the Main Entrance and check in at the main information desk.  You need to re-schedule your appointment should you arrive 10 or more minutes late.  We strive to give you quality time with our providers, and arriving late affects you and other patients whose appointments are after yours.  Also, if you no show three or more times for appointments you may be dismissed from the clinic at the providers discretion.     Again, thank you for choosing [facility].  Our hope is that these requests will decrease the amount of time that you wait before being seen by our physicians.       _____________________________________________________________  Should you have questions after your visit to [facility], please contact our office at [phone] and follow the prompts.  Our office hours are 8:00 a.m. and 4:30 p.m. Monday - Friday.  Please note that voicemails left after 4:00 p.m. may not be returned until the following business day.  We are closed weekends and major holidays.  You do have access to a nurse 24-7, just call the main number to the clinic [phone] and do not press any options, hold on the line and a nurse will answer the phone.    For  prescription refill requests, have your pharmacy contact our office and allow 72 hours.    Due to Covid, you will need to wear a mask upon entering the hospital. If you do not have a mask, a mask will be given to you at the Main Entrance upon arrival. For doctor visits, patients may have 1 support person age 51 or older with them. For treatment visits, patients can not have anyone with them due to social distancing guidelines and our immunocompromised population.

## 2024-03-24 NOTE — Progress Notes (Signed)
Patient is taking Revlimid as prescribed.  He has not missed any doses and reports no side effects at this time.   

## 2024-03-30 ENCOUNTER — Other Ambulatory Visit: Payer: Self-pay | Admitting: Hematology

## 2024-03-30 ENCOUNTER — Other Ambulatory Visit: Payer: Self-pay | Admitting: Cardiology

## 2024-04-12 ENCOUNTER — Ambulatory Visit: Payer: Medicare HMO | Admitting: *Deleted

## 2024-04-13 ENCOUNTER — Other Ambulatory Visit: Payer: Self-pay | Admitting: Internal Medicine

## 2024-04-13 DIAGNOSIS — G609 Hereditary and idiopathic neuropathy, unspecified: Secondary | ICD-10-CM

## 2024-04-14 ENCOUNTER — Inpatient Hospital Stay: Payer: Medicare HMO

## 2024-04-14 DIAGNOSIS — C9000 Multiple myeloma not having achieved remission: Secondary | ICD-10-CM

## 2024-04-14 DIAGNOSIS — C61 Malignant neoplasm of prostate: Secondary | ICD-10-CM

## 2024-04-14 DIAGNOSIS — C7951 Secondary malignant neoplasm of bone: Secondary | ICD-10-CM | POA: Diagnosis not present

## 2024-04-14 LAB — MAGNESIUM: Magnesium: 1.8 mg/dL (ref 1.7–2.4)

## 2024-04-14 LAB — CBC WITH DIFFERENTIAL/PLATELET
Abs Immature Granulocytes: 0 10*3/uL (ref 0.00–0.07)
Basophils Absolute: 0.1 10*3/uL (ref 0.0–0.1)
Basophils Relative: 3 %
Eosinophils Absolute: 0.2 10*3/uL (ref 0.0–0.5)
Eosinophils Relative: 7 %
HCT: 31.2 % — ABNORMAL LOW (ref 39.0–52.0)
Hemoglobin: 10.2 g/dL — ABNORMAL LOW (ref 13.0–17.0)
Immature Granulocytes: 0 %
Lymphocytes Relative: 24 %
Lymphs Abs: 0.5 10*3/uL — ABNORMAL LOW (ref 0.7–4.0)
MCH: 31.4 pg (ref 26.0–34.0)
MCHC: 32.7 g/dL (ref 30.0–36.0)
MCV: 96 fL (ref 80.0–100.0)
Monocytes Absolute: 0.2 10*3/uL (ref 0.1–1.0)
Monocytes Relative: 12 %
Neutro Abs: 1.1 10*3/uL — ABNORMAL LOW (ref 1.7–7.7)
Neutrophils Relative %: 54 %
Platelets: 130 10*3/uL — ABNORMAL LOW (ref 150–400)
RBC: 3.25 MIL/uL — ABNORMAL LOW (ref 4.22–5.81)
RDW: 16.6 % — ABNORMAL HIGH (ref 11.5–15.5)
WBC: 2 10*3/uL — ABNORMAL LOW (ref 4.0–10.5)
nRBC: 0 % (ref 0.0–0.2)

## 2024-04-14 LAB — COMPREHENSIVE METABOLIC PANEL WITH GFR
ALT: 20 U/L (ref 0–44)
AST: 21 U/L (ref 15–41)
Albumin: 3.2 g/dL — ABNORMAL LOW (ref 3.5–5.0)
Alkaline Phosphatase: 32 U/L — ABNORMAL LOW (ref 38–126)
Anion gap: 9 (ref 5–15)
BUN: 17 mg/dL (ref 8–23)
CO2: 25 mmol/L (ref 22–32)
Calcium: 8.6 mg/dL — ABNORMAL LOW (ref 8.9–10.3)
Chloride: 105 mmol/L (ref 98–111)
Creatinine, Ser: 1.06 mg/dL (ref 0.61–1.24)
GFR, Estimated: >60 mL/min (ref >60)
Glucose, Bld: 99 mg/dL (ref 70–99)
Potassium: 3.5 mmol/L (ref 3.5–5.1)
Sodium: 139 mmol/L (ref 135–145)
Total Bilirubin: 0.6 mg/dL (ref 0.0–1.2)
Total Protein: 5.7 g/dL — ABNORMAL LOW (ref 6.5–8.1)

## 2024-04-14 MED ORDER — SODIUM CHLORIDE 0.9% FLUSH: 10.0000 mL | Freq: Once | INTRAVENOUS | Status: DC

## 2024-04-14 MED ORDER — DENOSUMAB 120 MG/1.7ML ~~LOC~~ SOLN
120.0000 mg | Freq: Once | SUBCUTANEOUS | Status: AC
  Administered 2024-04-14: 120 mg via SUBCUTANEOUS
  Filled 2024-04-14: qty 1.7

## 2024-04-14 NOTE — Progress Notes (Signed)
 Luis Stewart presented for Portacath access and flush. Proper placement of portacath confirmed by CXR. Portacath located left chest wall accessed with  H 20 needle. Good blood return present. Portacath flushed with 20ml NS and 500U/5ml Heparin  and needle removed intact. Procedure without incident. Patient tolerated procedure well.   Xgeva  injection  given per orders. Patient tolerated it well without problems. Vitals stable and discharged home from clinic ambulatory. Follow up as scheduled.

## 2024-04-14 NOTE — Patient Instructions (Signed)
 [facility] [place] - A DEPT OF [facility]  Discharge Instructions: Thank you for choosing [facility] to provide your oncology and hematology care.  If you have a lab appointment with the Cancer Center - please note that after April 8th, 2024, all labs will be drawn in the cancer center.  You do not have to check in or register with the main entrance as you have in the past but will complete your check-in in the cancer center.  Wear comfortable clothing and clothing appropriate for easy access to any Portacath or PICC line.   We strive to give you quality time with your provider. You may need to reschedule your appointment if you arrive late (15 or more minutes).  Arriving late affects you and other patients whose appointments are after yours.  Also, if you miss three or more appointments without notifying the office, you may be dismissed from the clinic at the provider's discretion.      For prescription refill requests, have your pharmacy contact our office and allow 72 hours for refills to be completed.    Today you received the following: port flush with labs and xgeva  injection   To help prevent nausea and vomiting after your treatment, we encourage you to take your nausea medication as directed.  BELOW ARE SYMPTOMS THAT SHOULD BE REPORTED IMMEDIATELY: *FEVER GREATER THAN 100.4 F (38 C) OR HIGHER *CHILLS OR SWEATING *NAUSEA AND VOMITING THAT IS NOT CONTROLLED WITH YOUR NAUSEA MEDICATION *UNUSUAL SHORTNESS OF BREATH *UNUSUAL BRUISING OR BLEEDING *URINARY PROBLEMS (pain or burning when urinating, or frequent urination) *BOWEL PROBLEMS (unusual diarrhea, constipation, pain near the anus) TENDERNESS IN MOUTH AND THROAT WITH OR WITHOUT PRESENCE OF ULCERS (sore throat, sores in mouth, or a toothache) UNUSUAL RASH, SWELLING OR PAIN  UNUSUAL VAGINAL DISCHARGE OR ITCHING   Items with * indicate a potential emergency and should be followed up as soon as  possible or go to the Emergency Department if any problems should occur.  Please show the CHEMOTHERAPY ALERT CARD or IMMUNOTHERAPY ALERT CARD at check-in to the Emergency Department and triage nurse.  Should you have questions after your visit or need to cancel or reschedule your appointment, please contact [facility] [place] - A DEPT OF [facility] [phone]  and follow the prompts.  Office hours are 8:00 a.m. to 4:30 p.m. Monday - Friday. Please note that voicemails left after 4:00 p.m. may not be returned until the following business day.  We are closed weekends and major holidays. You have access to a nurse at all times for urgent questions. Please call the main number to the clinic [phone] and follow the prompts.  For any non-urgent questions, you may also contact your provider using MyChart. We now offer e-Visits for anyone 27 and older to request care online for non-urgent symptoms. For details visit [email or web address].   Also download the MyChart app! Go to the app store, search "MyChart", open the app, select [place], and log in with your MyChart username and password.

## 2024-04-18 ENCOUNTER — Ambulatory Visit: Attending: Internal Medicine | Admitting: Audiology

## 2024-04-18 DIAGNOSIS — H903 Sensorineural hearing loss, bilateral: Secondary | ICD-10-CM | POA: Diagnosis not present

## 2024-04-18 NOTE — Procedures (Signed)
  Outpatient Audiology and [facility] [address] [place], [place]  [address] [phone]  AUDIOLOGICAL  EVALUATION  NAME: Luis Stewart     DOB:   [birth year]      MRN: [id number]                                                                                     DATE: 04/18/2024     REFERENT: Meldon Sport, MD STATUS: Outpatient DIAGNOSIS: Sensorineural hearing loss  History: Luis Stewart was seen for an audiological evaluation due to concerns regarding his hearing sensitivity. Luis Stewart reports a history of occupational noise exposure.  He reports he utilized hearing protection for most of his career. Luis Stewart previously had routine hearing evaluations for his job looking for an updated evaluation. Luis Stewart denies otalgia, aural fullness, tinnitus, and dizziness. Luis Stewart reports he overall feels he can hear very well.  Evaluation:  Otoscopy showed a clear view of the tympanic membranes, bilaterally Tympanometry results were consistent with normal middle ear pressure and normal tympanic membrane mobility in both ears Audiometric testing was completed using Conventional Audiometry techniques with insert earphones and TDH headphones. Test results are consistent in the right ear with normal hearing sensitivity with the exception of a moderate high-frequency hearing loss at 8000 Hz and test results are consistent in the left ear with normal hearing sensitivity with the exception of a mild high-frequency hearing loss at 6000 Hz.  There is an asymmetry noted at 6000 Hz which is worse in the left ear and asymmetry noted at 8000 Hz which is worse in the right ear.  Speech Recognition Thresholds were obtained at 15 dB HL in the right ear and at 10  dB HL in the left ear. Word Recognition Testing was completed at 60 dB HL and Aarush scored 100%, bilaterally  Results:  The test results were reviewed with Luis Stewart. Test results are consistent in the right ear with normal hearing  sensitivity with the exception of a moderate high-frequency hearing loss at 8000 Hz and test results are consistent in the left ear with normal hearing sensitivity with the exception of a mild high-frequency hearing loss at 6000 Hz.  There is an asymmetry noted at 6000 Hz which is worse in the left ear and asymmetry noted at 8000 Hz which is worse in the right ear. Luis Stewart may have hearing and communication difficulty in adverse listening environments such as a noisy situation.  He will benefit from the use of good communication strategies.  Audiological monitoring is recommended.  Recommendations: 1.   Return in 2 to 3 years for an updated audiological evaluation to continue to monitor hearing sensitivity.   30 minutes spent testing and counseling on results.   If you have any questions please feel free to contact me at [phone].  Bert Britain Audiologist, Au.D., CCC-A 04/18/2024  2:08 PM  Cc: Meldon Sport, MD

## 2024-04-19 ENCOUNTER — Encounter: Payer: Self-pay | Admitting: *Deleted

## 2024-04-19 ENCOUNTER — Other Ambulatory Visit: Payer: Self-pay

## 2024-04-19 NOTE — Patient Instructions (Signed)
 Visit Information  Thank you for taking time to visit with me today. Please don't hesitate to contact me if I can be of assistance to you before our next scheduled appointment.  Your next care management appointment is by telephone on 04/26/24 at 1030  Grandville Lax [phone] will be the new nurse care manager for [facility] office   Please call the care guide team at [phone] if you need to cancel, schedule, or reschedule an appointment.   Please call the Suicide and Crisis Lifeline: 988 call the [place]  National Suicide Prevention Lifeline: 786-180-0168 or TTY: 4755785298 TTY (902)329-0881) to talk to a trained counselor call 1-800-273-TALK (toll free, 24 hour hotline) call the [facility]: 270-862-1374 call 911 if you are experiencing a Mental Health or Behavioral Health Crisis or need someone to talk to.   Komal Stangelo L. Mcarthur Speedy, RN, BSN, CCM [place]  [facility], [facility] RN Care Manager Direct Dial: [phone]  Fax: [phone]

## 2024-04-19 NOTE — Patient Outreach (Signed)
 Complex Care Management   Visit Note  04/19/2024 late entry for 04/12/24  Name:  Luis Stewart MRN: [id number] DOB: Jul 21, 1951  Situation: Referral received for Complex Care Management related to  multiple myeloma, pain management, prostate cancer  I obtained verbal consent from Patient.  Visit completed with [name]  on the phone  Background:   Past Medical History:  Diagnosis Date   Arthritis    Asbestosis (HCC)    Closed fracture of distal end of fibula with tibia with routine healing 05/28/2020   COVID-19 09/03/2021   Cyst of kidney, acquired    Cystic disease of liver    Enlarged prostate    Headache    History of fracture of leg    Right, childhood   Hx of migraine headaches    Hyperlipidemia    Multiple myeloma (HCC)    Neuropathy    PE (pulmonary thromboembolism) (HCC)    After knee surgery   Prostate cancer (HCC) 2015   Adenocarcinoma by biopsy    Assessment: Patient Reported Symptoms:  Cognitive Cognitive Status: Alert and oriented to person, place, and time, Insightful and able to interpret abstract concepts, Normal speech and language skills Cognitive/Intellectual Conditions Management [RPT]: None reported or documented in medical history or problem list   Health Maintenance Behaviors: Annual physical exam, Exercise, Healthy diet, Sleep adequate, Social activities, Spiritual practice(s), Stress management Healing Pattern: Average Health Facilitated by: Pain control, Healthy diet, Prayer/meditation, Rest, Stress management  Neurological Neurological Review of Symptoms: Hearing changes, Weakness Neurological Management Strategies: Adequate rest, Routine screening Neurological Self-Management Outcome: 3 (uncertain)  HEENT HEENT Symptoms Reported: Change or loss of hearing HEENT Conditions: Ear problem(s) HEENT Management Strategies: Adequate rest, Coping strategies, Routine screening HEENT Self-Management Outcome: 3 (uncertain) Ear  problem(s)  Cardiovascular Cardiovascular Symptoms Reported: Fatigue Does patient have uncontrolled Hypertension?: No Cardiovascular Conditions: High blood cholesterol, Dysrhythmia Cardiovascular Management Strategies: Adequate rest, Exercise, Routine screening, Fluid modification, Diet modification, Medication therapy Cardiovascular Self-Management Outcome: 4 (good)  Respiratory Respiratory Symptoms Reported: Dry cough Other Respiratory Symptoms: history of exposure to asbetos, pulmonary nodule Respiratory Conditions: Cough Respiratory Self-Management Outcome: 4 (good)  Endocrine Patient reports the following symptoms related to hypoglycemia or hyperglycemia : No symptoms reported Is patient diabetic?: Yes Is patient checking blood sugars at home?: No (pre diabetes) Endocrine Conditions: Diabetes, Vitamin D  deficiency Endocrine Management Strategies: Activity, Adequate rest, Diet modification, Exercise, Fluid modification, Routine screening Endocrine Self-Management Outcome: 4 (good)  Gastrointestinal Gastrointestinal Symptoms Reported: No symptoms reported Gastrointestinal Conditions: Reflux/heartburn Gastrointestinal Management Strategies: Diet modification, Adequate rest, Medication therapy, Exercise, Activity Gastrointestinal Self-Management Outcome: 4 (good) Nutrition Risk Screen (CP): No indicators present  Genitourinary Genitourinary Symptoms Reported: No symptoms reported Genitourinary Conditions: Prostate cancer Genitourinary Management Strategies: Adequate rest, Exercise Genitourinary Self-Management Outcome: 4 (good)  Integumentary Integumentary Symptoms Reported: No symptoms reported Skin Management Strategies: Adequate rest, Routine screening Skin Self-Management Outcome: 4 (good)  Musculoskeletal Musculoskelatal Symptoms Reviewed: Weakness, Other Other Musculoskeletal Symptoms: numbness of right foot/leg Musculoskeletal Conditions: Mobility limited Musculoskeletal  Management Strategies: Activity, Adequate rest, Exercise, Medication therapy, Medical device, Routine screening Musculoskeletal Self-Management Outcome: 4 (good) Falls in the past year?: Yes Number of falls in past year: 1 or less Was there an injury with Fall?: No Fall Risk Category Calculator: 1 Patient Fall Risk Level: Low Fall Risk Patient at Risk for Falls Due to: History of fall(s) Fall risk Follow up: Falls evaluation completed  Psychosocial Psychosocial Symptoms Reported: No symptoms reported Behavioral Management Strategies: Activity, Adequate rest,  Coping strategies, Support system Behavioral Health Self-Management Outcome: 4 (good) Major Change/Loss/Stressor/Fears (CP): Medical condition, self Techniques to Cope with Loss/Stress/Change: Diversional activities Quality of Family Relationships: helpful, involved, supportive Do you feel physically threatened by others?: No      04/12/2024    2:08 PM  Depression screen PHQ 2/9  Decreased Interest 0  Down, Depressed, Hopeless 0  PHQ - 2 Score 0    There were no vitals filed for this visit.  Medications Reviewed Today     Reviewed by Arlyce Berger, RN (Registered Nurse) on 04/19/24 at 1204  Med List Status: <None>   Medication Order Taking? Sig Documenting Provider Last Dose Status Informant  acetaminophen  (TYLENOL ) 500 MG tablet [id number] Yes Take 1,000 mg by mouth every 6 (six) hours as needed for mild pain or moderate pain. [provider] Taking Active Self  acyclovir  (ZOVIRAX ) 400 MG tablet [id number]  TAKE 1 TABLET BY MOUTH TWICE A DAY Paulett Boros, MD  Active   Cholecalciferol (VITAMIN D ) 2000 units CAPS [id number]  Take 2,000 Units by mouth every other day. [provider]  Active Self           Med Note Raquel Cables, Sherrilyn Doll T   Tue Apr 15, 2022  8:04 AM)    DULoxetine  (CYMBALTA ) 60 MG capsule [id number] Yes TAKE 1 CAPSULE BY MOUTH EVERY DAY Meldon Sport, MD Taking Active   ferrous sulfate  325 (65 FE) MG EC tablet [id number] Yes Take 325 mg by mouth daily. [provider] Taking Active   lenalidomide  (REVLIMID ) 15 MG capsule [id number] Yes TAKE 1 CAPSULE BY MOUTH 1 TIME A DAY FOR 21 DAYS ON THEN 7 DAYS OFF. REPEAT EVERY 28 DAYS. Katragadda, Sreedhar, MD Taking Active   Multiple Vitamin (MULTIVITAMIN WITH MINERALS) TABS tablet [id number]  Take 1 tablet by mouth daily with breakfast. Centrum Silver for Men [provider]  Active Self  nitroGLYCERIN  (NITROSTAT ) 0.4 MG SL tablet [id number]  Place 1 tablet (0.4 mg total) under the tongue every 5 (five) minutes x 3 doses as needed for chest pain (If no relief after 3rd dose, call or go to ED.). Lasalle Pointer, NP  Active   pantoprazole  (PROTONIX ) 40 MG tablet [id number]  TAKE 1 TABLET BY MOUTH EVERY OTHER DAY Meldon Sport, MD  Active   potassium chloride  (KLOR-CON ) 10 MEQ tablet [id number]  Take 1 tablet (10 mEq total) by mouth daily. Lasalle Pointer, NP  Active   RESTASIS 0.05 % ophthalmic emulsion [id number]  Place 1 drop into both eyes 2 (two) times daily. [provider]  Active   rosuvastatin  (CRESTOR ) 5 MG tablet [id number] Yes TAKE 1 TABLET (5 MG TOTAL) BY MOUTH DAILY. Gerard Knight, MD Taking Active   tamsulosin  (FLOMAX ) 0.4 MG CAPS capsule [id number] Yes Take 0.4 mg by mouth every other day. [provider] Taking Active   Med List Note Thaddeus Filippo, RPH-CPP 12/28/23 1146): Revlimid  (lenalidomide ) filled at [facility]            Recommendation:   PCP Follow-up Referral to podiatrist- 2 in network providers found and offered   Follow Up Plan:   Telephone follow up appointment date/time:  04/26/24 1030  Assess progress in podiatry referral. Discuss ongoing nurse care management services with Grandville Lax [phone] and this RN CM will answer questions as needed   Kindel Rochefort L. Mcarthur Speedy, RN, BSN, CCM [place]  [facility], [facility] RN Care  Manager Direct  Dial: [phone]  Fax: [phone]

## 2024-04-20 ENCOUNTER — Other Ambulatory Visit: Payer: Self-pay | Admitting: Hematology

## 2024-04-20 ENCOUNTER — Other Ambulatory Visit: Payer: Self-pay | Admitting: *Deleted

## 2024-04-20 DIAGNOSIS — C9000 Multiple myeloma not having achieved remission: Secondary | ICD-10-CM

## 2024-04-20 MED ORDER — LENALIDOMIDE 15 MG PO CAPS: 15.0000 mg | ORAL_CAPSULE | Freq: Every day | ORAL | 0 refills | Status: DC

## 2024-04-26 ENCOUNTER — Other Ambulatory Visit: Payer: Self-pay | Admitting: *Deleted

## 2024-04-26 NOTE — Patient Instructions (Signed)
 Visit Information  Thank you for taking time to visit with me today. Please don't hesitate to contact me if I can be of assistance to you before our next scheduled appointment.  Your next care management appointment is {NEXTVISITTYPE:26617} on *** at ***  {VBCIFOLLOWUP:32484}  Please call the care guide team at [phone] if you need to cancel, schedule, or reschedule an appointment.   Please {MHBHCRISISCONTACTS:26616} if you are experiencing a Mental Health or Behavioral Health Crisis or need someone to talk to.  SIGNATURE***

## 2024-04-26 NOTE — Patient Outreach (Signed)
 Complex Care Management   Visit Note  04/27/2024  Name:  Luis Stewart MRN: [id number] DOB: 1951/03/14  Situation: Referral received for Complex Care Management related to  multiple myeloma, pain management, prostate cancer  I obtained verbal consent from Patient.  Visit completed with [name]  on the phone  Confirmed he has not received a call about podiatry referral to [facility]  Disussed the location of this office with him RN CM to try to find the status of referral  Background:   Past Medical History:  Diagnosis Date   Arthritis    Asbestosis (HCC)    Closed fracture of distal end of fibula with tibia with routine healing 05/28/2020   COVID-19 09/03/2021   Cyst of kidney, acquired    Cystic disease of liver    Enlarged prostate    Headache    History of fracture of leg    Right, childhood   Hx of migraine headaches    Hyperlipidemia    Multiple myeloma (HCC)    Neuropathy    PE (pulmonary thromboembolism) (HCC)    After knee surgery   Prostate cancer (HCC) 2015   Adenocarcinoma by biopsy    Assessment: Patient Reported Symptoms:  Cognitive Cognitive Status: Alert and oriented to person, place, and time, Insightful and able to interpret abstract concepts, Normal speech and language skills Cognitive/Intellectual Conditions Management [RPT]: None reported or documented in medical history or problem list   Health Maintenance Behaviors: Annual physical exam, Healthy diet, Sleep adequate, Social activities, Spiritual practice(s), Stress management Healing Pattern: Average Health Facilitated by: Pain control, Prayer/meditation, Rest, Stress management  Neurological Neurological Review of Symptoms: No symptoms reported Neurological Management Strategies: Activity, Routine screening Neurological Self-Management Outcome: 4 (good)  HEENT HEENT Symptoms Reported: Change or loss of hearing HEENT Conditions: Ear problem(s) HEENT  Management Strategies: Adequate rest, Coping strategies, Routine screening HEENT Self-Management Outcome: 4 (good) Ear problem(s)  Cardiovascular Cardiovascular Symptoms Reported: Fainting Does patient have uncontrolled Hypertension?: No Cardiovascular Conditions: High blood cholesterol, Dysrhythmia Cardiovascular Management Strategies: Adequate rest, Exercise, Fluid modification, Diet modification, Medication therapy, Routine screening Cardiovascular Self-Management Outcome: 4 (good)  Respiratory Respiratory Symptoms Reported: Dry cough Other Respiratory Symptoms: history of wxposure to asvetos, pulmonary nodule Respiratory Conditions: Cough Respiratory Self-Management Outcome: 4 (good)  Endocrine Patient reports the following symptoms related to hypoglycemia or hyperglycemia : No symptoms reported Is patient diabetic?: Yes Is patient checking blood sugars at home?: No Endocrine Conditions: Diabetes, Vitamin D  deficiency Endocrine Management Strategies: Activity, Adequate rest, Diet modification, Coping strategies, Exercise, Fluid modification, Routine screening Endocrine Self-Management Outcome: 4 (good)  Gastrointestinal Gastrointestinal Symptoms Reported: No symptoms reported Gastrointestinal Conditions: Reflux/heartburn Gastrointestinal Management Strategies: Diet modification, Adequate rest, Medication therapy Gastrointestinal Self-Management Outcome: 4 (good) Nutrition Risk Screen (CP): No indicators present  Genitourinary Genitourinary Symptoms Reported: No symptoms reported Genitourinary Conditions: Prostate cancer Genitourinary Management Strategies: Adequate rest, Coping strategies Genitourinary Self-Management Outcome: 4 (good)  Integumentary Integumentary Symptoms Reported: No symptoms reported Skin Management Strategies: Adequate rest, Routine screening Skin Self-Management Outcome: 4 (good)  Musculoskeletal Musculoskelatal Symptoms Reviewed: Weakness, Other Other  Musculoskeletal Symptoms: numbness of right foor Musculoskeletal Conditions: Mobility limited Musculoskeletal Management Strategies: Adequate rest, Coping strategies, Medication therapy, Routine screening Musculoskeletal Self-Management Outcome: 4 (good) Falls in the past year?: Yes Number of falls in past year: 1 or less Was there an injury with Fall?: No Fall Risk Category Calculator: 1 Patient Fall Risk Level: Low Fall Risk Patient at Risk for Falls Due  to: History of fall(s) Fall risk Follow up: Falls evaluation completed  Psychosocial Psychosocial Symptoms Reported: No symptoms reported Behavioral Management Strategies: Activity, Adequate rest, Coping strategies, Support system Behavioral Health Self-Management Outcome: 4 (good) Major Change/Loss/Stressor/Fears (CP): Medical condition, self Techniques to Cope with Loss/Stress/Change: Diversional activities, Spiritual practice(s) Quality of Family Relationships: helpful, supportive, involved Do you feel physically threatened by others?: No      04/27/2024    6:48 PM  Depression screen PHQ 2/9  Decreased Interest 0  Down, Depressed, Hopeless 0  PHQ - 2 Score 0    There were no vitals filed for this visit.  Medications Reviewed Today     Reviewed by Arlyce Berger, RN (Registered Nurse) on 04/27/24 at 1816  Med List Status: <None>   Medication Order Taking? Sig Documenting Provider Last Dose Status Informant  acetaminophen  (TYLENOL ) 500 MG tablet [id number] Yes Take 1,000 mg by mouth every 6 (six) hours as needed for mild pain or moderate pain. [provider] Taking Active Self  acyclovir  (ZOVIRAX ) 400 MG tablet [id number] Yes TAKE 1 TABLET BY MOUTH TWICE A DAY Paulett Boros, MD Taking Active   Cholecalciferol (VITAMIN D ) 2000 units CAPS [id number] Yes Take 2,000 Units by mouth every other day. [provider] Taking Active Self           Med Note Raquel Cables, Sherrilyn Doll T   Tue Apr 15, 2022  8:04 AM)     DULoxetine  (CYMBALTA ) 60 MG capsule [id number] Yes TAKE 1 CAPSULE BY MOUTH EVERY DAY Meldon Sport, MD Taking Active   ferrous sulfate 325 (65 FE) MG EC tablet [id number] Yes Take 325 mg by mouth daily. [provider] Taking Active   lenalidomide  (REVLIMID ) 15 MG capsule [id number] Yes Take 1 capsule (15 mg total) by mouth daily. [employer] Auth # [id number]   Date Obtained 04/20/2024 Katragadda, Sreedhar, MD Taking Active   Multiple Vitamin (MULTIVITAMIN WITH MINERALS) TABS tablet [id number] Yes Take 1 tablet by mouth daily with breakfast. Centrum Silver for Men [provider] Taking Active Self  nitroGLYCERIN  (NITROSTAT ) 0.4 MG SL tablet [id number] Yes Place 1 tablet (0.4 mg total) under the tongue every 5 (five) minutes x 3 doses as needed for chest pain (If no relief after 3rd dose, call or go to ED.). Lasalle Pointer, NP Taking Active   pantoprazole  (PROTONIX ) 40 MG tablet [id number] Yes TAKE 1 TABLET BY MOUTH EVERY OTHER DAY Patel, Rutwik K, MD Taking Active   potassium chloride  (KLOR-CON ) 10 MEQ tablet [id number] Yes Take 1 tablet (10 mEq total) by mouth daily. Lasalle Pointer, NP Taking Active   RESTASIS 0.05 % ophthalmic emulsion [id number] Yes Place 1 drop into both eyes 2 (two) times daily. [provider] Taking Active   rosuvastatin  (CRESTOR ) 5 MG tablet [id number] Yes TAKE 1 TABLET (5 MG TOTAL) BY MOUTH DAILY. Gerard Knight, MD Taking Active   tamsulosin  (FLOMAX ) 0.4 MG CAPS capsule [id number] Yes Take 0.4 mg by mouth every other day. [provider] Taking Active   Med List Note Thaddeus Filippo, RPH-CPP 12/28/23 1146): Revlimid  (lenalidomide ) filled at [facility]            Recommendation:   PCP Follow-up + podiatry follow up  Follow Up Plan:   Telephone follow up appointment date/time:  05/10/24 1030  Jayel Scaduto L. Mcarthur Speedy, RN, BSN, CCM [place]  [facility], [facility] RN Care Manager Direct Dial:  [phone]  Fax: [phone]

## 2024-04-27 ENCOUNTER — Other Ambulatory Visit: Payer: Self-pay

## 2024-04-28 ENCOUNTER — Telehealth: Payer: Self-pay | Admitting: *Deleted

## 2024-04-28 NOTE — Patient Outreach (Signed)
 RN CM outreached to [facility] to inquire about the status of the 04/13/24 referral for patient Spoke with Abe Abed who was not able to find that at referral had been sent on 04/13/24  Chat outreach to [facility] [employer] office manager Y Bonita Bussing to update & inquire about status of this referral  Charleston Vierling L. Mcarthur Speedy, RN, BSN, CCM [place]  [facility], [facility] RN Care Manager Direct Dial: [phone]  Fax: [phone]

## 2024-04-29 ENCOUNTER — Telehealth: Payer: Self-pay | Admitting: Internal Medicine

## 2024-04-29 NOTE — Telephone Encounter (Signed)
 I have refaxed Referral.

## 2024-04-29 NOTE — Telephone Encounter (Signed)
 Copied from CRM [id number]. Topic: Referral - Status >> Apr 29, 2024 11:14 AM Crispin Dolphin wrote: Reason for CRM: Abe Abed called from [facility] ankle about referral received. It did not come through all the way and is missing information. Can it be resent to Fax: [phone]. She is working out of [employer] today. Thank You

## 2024-05-02 NOTE — Progress Notes (Incomplete)
 History of Present Illness: Luis Stewart returns for f/u of BPH as well as PCa  Prior history of elevated PSA--s/p TRUS/Bx in [place] in 2005 and 2008. Apparently, all cores were negative but some were not indicative of prostate tissue at all.   Because of a high PCA 3 test in this office, he underwent TRUS/Bx on 6.23.2016. 1/12 cores were positive for adenocarcinoma-10% of the core in the right lateral base revealed Gleason 3+3 equal 6 adenocarcinoma. Prostate volume was 175 cc. PSA was 16.66. PSA density 0.09. He does not have significant lower urinary tract symptomatology.   He consulted with Dr. Dulcy Gibney in [place] to consider robotic prostatectomy. Due to his low volume prostate cancer, and very low PSA density, it was recommended that he continue with active surveillance.   1.30.2017: Surveillance TRUS/Bx , following a prostatic MRI which revealed no suspicious prostatic lesions and correlated the patient's large prostate. At that time, all 12 cores were negative for prostate cancer. Prostatic volume was 190 mL.   8.21.2019: fusion TRUS/Bx. Recent prostate MRI--volume 220 ml. No significant lesions. No evidence transcapsular/perineural,SV/bony or lymphatic disease. Path--1 core (left apex) revealed GS 3+3 pattern in 5% of core.    PSA followup:   10.18.2016--17.16 11.14.2017--15.4 5.25.2018--17.9 4.29.2019--18.4 2.25.2020--15.9 3.30.2021--15.5 3.30.2022--22.4   9.24.2021 MRI prostate. Prostate volume 250 mL. No significant change compared to prior exam. No radiographic evidence of high-grade prostate carcinoma. PI-RADS 2: Low (clinically significant cancer is unlikely to be present)   4.5.2022: PSA 22.4.   10.4.2022:  PSA 34.5 this was drawn while the patient was being treated for an active COVID infection.  He was put on finasteride  after his last visit.  He was unable to tolerate it due to worsening of his lower urinary tract symptoms.   3.23.2023: PSMA PET scan-- 1.  Multifocal radiotracer avid prostate cancer skeletal metastasis. Broad lytic lesion within the sacrum with associated radiotracer activity. Multiple discrete lesions in the thoracic lumbar spine and ribs with minimal CT change. 2. Markedly enlarged prostate gland. Focal activity inferior LEFT aspect the gland is suggestive of prostate adenocarcinoma. 3. No nodal metastasis in the abdomen pelvis. No visceral metastasis.    11.7.2023: Here for recheck.  He is on Firmagon  monthly for metastatic prostate cancer (confirmed by PSMA PET scan).  He is receiving regular infusions for multiple myeloma.  He is having hot flashes and low energy level which may be attributable to his androgen deprivation therapy.  PSA prior to initiation of ADT was around 27, most recently 2.1.  5.7.2024: Here for recheck.  Most recent PSA up slightly to 1.33.  Prior to that it was 0.88 just over a month ago.  He donated stem cells for a potential stem cell transplant last week.  He is having no bony pain.  He is on tamsulosin -lower urinary tract symptoms got worse after he was started on ADT last spring.  11.12.2024: PSA 0.57.  He received a 67-month Eligard  dose on the 10th of last month with Dr. Cheree Cords.  He is on Xgeva  administered for his metastatic prostate cancer as well as his myeloma.  No current bony pain.  He takes vitamin D  every other day.  Also on tamsulosin  every other day with adequate voiding symptoms-IPSS 8.  Last bone density study was in June 2023.  5.13.2025:   Past Medical History:  Diagnosis Date   Arthritis    Asbestosis (HCC)    Closed fracture of distal end of fibula with tibia with routine healing 05/28/2020  COVID-19 09/03/2021   Cyst of kidney, acquired    Cystic disease of liver    Enlarged prostate    Headache    History of fracture of leg    Right, childhood   Hx of migraine headaches    Hyperlipidemia    Multiple myeloma (HCC)    Neuropathy    PE (pulmonary thromboembolism)  (HCC)    After knee surgery   Prostate cancer (HCC) 2015   Adenocarcinoma by biopsy    Past Surgical History:  Procedure Laterality Date   BIOPSY PROSTATE  2003 and 2005   COLONOSCOPY  11/28/2008   Dr. Rourk:internal hemorrhoids/tortus colon/ascending colon polyps, tubular adenomas   COLONOSCOPY N/A 09/04/2014   Procedure: COLONOSCOPY;  Surgeon: Suzette Espy, MD;  [facility];  Service: Endoscopy;  Laterality: N/A;  9:30   COLONOSCOPY N/A 07/17/2017   Procedure: COLONOSCOPY;  Surgeon: Alyce Jubilee, MD;  [facility];  Service: Endoscopy;  Laterality: N/A;   COLONOSCOPY WITH PROPOFOL  N/A 10/05/2023   Procedure: COLONOSCOPY WITH PROPOFOL ;  Surgeon: Suzette Espy, MD;  [facility];  Service: Endoscopy;  Laterality: N/A;  10:00AM, ASA 3   ESOPHAGOGASTRODUODENOSCOPY N/A 07/16/2017   Procedure: ESOPHAGOGASTRODUODENOSCOPY (EGD);  Surgeon: Alyce Jubilee, MD;  [facility];  Service: Endoscopy;  Laterality: N/A;   GIVENS CAPSULE STUDY  07/16/2017   Procedure: GIVENS CAPSULE STUDY;  Surgeon: Alyce Jubilee, MD;  [facility];  Service: Endoscopy;;   JOINT REPLACEMENT N/A    Phreesia 01/12/2021   POLYPECTOMY  07/17/2017   Procedure: POLYPECTOMY;  Surgeon: Alyce Jubilee, MD;  [facility];  Service: Endoscopy;;  cecal   POLYPECTOMY  10/05/2023   Procedure: POLYPECTOMY;  Surgeon: Suzette Espy, MD;  [facility];  Service: Endoscopy;;   PORTACATH PLACEMENT Left 04/18/2022   Procedure: INSERTION PORT-A-CATH;  Surgeon: Alanda Allegra, MD;  Location: [facility];  Service: General;  Laterality: Left;   TOTAL KNEE ARTHROPLASTY Right 06/10/2017   TOTAL KNEE ARTHROPLASTY Right 06/10/2017   Procedure: TOTAL KNEE ARTHROPLASTY;  Surgeon: Ferd Householder, MD;  Location: [facility] OR;  Service: Orthopedics;  Laterality: Right;    Home Medications:  Allergies as of 05/03/2024       Reactions   Sulfa Antibiotics Other (See Comments)    Unknown reaction. Childhood reaction        Medication List        Accurate as of May 02, 2024 12:31 PM. If you have any questions, ask your nurse or doctor.          acetaminophen  500 MG tablet Commonly known as: TYLENOL  Take 1,000 mg by mouth every 6 (six) hours as needed for mild pain or moderate pain.   acyclovir  400 MG tablet Commonly known as: ZOVIRAX  TAKE 1 TABLET BY MOUTH TWICE A DAY   DULoxetine  60 MG capsule Commonly known as: CYMBALTA  TAKE 1 CAPSULE BY MOUTH EVERY DAY   ferrous sulfate 325 (65 FE) MG EC tablet Take 325 mg by mouth daily.   lenalidomide  15 MG capsule Commonly known as: REVLIMID  Take 1 capsule (15 mg total) by mouth daily. [employer] Auth # [id number]   Date Obtained 04/20/2024   multivitamin with minerals Tabs tablet Take 1 tablet by mouth daily with breakfast. Centrum Silver for Men   nitroGLYCERIN  0.4 MG SL tablet Commonly known as: NITROSTAT  Place 1 tablet (0.4 mg total) under the tongue every 5 (five) minutes x 3  doses as needed for chest pain (If no relief after 3rd dose, call or go to ED.).   pantoprazole  40 MG tablet Commonly known as: PROTONIX  TAKE 1 TABLET BY MOUTH EVERY OTHER DAY   potassium chloride  10 MEQ tablet Commonly known as: KLOR-CON  Take 1 tablet (10 mEq total) by mouth daily.   Restasis 0.05 % ophthalmic emulsion Generic drug: cycloSPORINE Place 1 drop into both eyes 2 (two) times daily.   rosuvastatin  5 MG tablet Commonly known as: CRESTOR  TAKE 1 TABLET (5 MG TOTAL) BY MOUTH DAILY.   tamsulosin  0.4 MG Caps capsule Commonly known as: FLOMAX  Take 0.4 mg by mouth every other day.   Vitamin D  50 MCG (2000 UT) Caps Take 2,000 Units by mouth every other day.        Allergies:  Allergies  Allergen Reactions   Sulfa Antibiotics Other (See Comments)    Unknown reaction. Childhood reaction    Family History  Problem Relation Age of Onset   Diabetes Mother    Hypertension Mother    Obesity Mother     Heart disease Mother    Mental illness Mother    Hypertension Sister    Obesity Sister    Arthritis Sister    Deep vein thrombosis Father    Dementia Father    Heart disease Father        CABG   Colon cancer Neg Hx     Social History:  reports that he has never smoked. He has never used smokeless tobacco. He reports that he does not drink alcohol and does not use drugs.  ROS: A complete review of systems was performed.  All systems are negative except for pertinent findings as noted.  Physical Exam:  Vital signs in last 24 hours: There were no vitals taken for this visit. Constitutional:  Alert and oriented, No acute distress Cardiovascular: Regular rate  Respiratory: Normal respiratory effort Neurologic: Grossly intact, no focal deficits Psychiatric: Normal mood and affect  I have reviewed prior pt notes  I have reviewed notes from referring/previous physicians--Dr. Milburn Aliment notes  I have independently reviewed prior imaging-looked at his CT images from 2023.  Bladder empties well.  Prostate volume huge at that time.  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  He is only currently on androgen deprivation therapy with every 6 month Eligard  as well as denosumab   -BPH, minimal symptoms but he has a huge gland.  On every other day tamsulosin   Plan:   -I told him he can try taking himself off of the tamsulosin   -I agree with current therapy administered by Dr. Cheree Cords.  -I will see back in 6 months for recheck

## 2024-05-03 ENCOUNTER — Ambulatory Visit: Payer: Medicare HMO | Admitting: Urology

## 2024-05-03 DIAGNOSIS — N401 Enlarged prostate with lower urinary tract symptoms: Secondary | ICD-10-CM

## 2024-05-03 DIAGNOSIS — C61 Malignant neoplasm of prostate: Secondary | ICD-10-CM

## 2024-05-03 DIAGNOSIS — C7951 Secondary malignant neoplasm of bone: Secondary | ICD-10-CM

## 2024-05-05 ENCOUNTER — Other Ambulatory Visit

## 2024-05-10 ENCOUNTER — Encounter: Payer: Self-pay | Admitting: *Deleted

## 2024-05-10 ENCOUNTER — Other Ambulatory Visit: Payer: Self-pay

## 2024-05-10 ENCOUNTER — Other Ambulatory Visit: Payer: Self-pay | Admitting: *Deleted

## 2024-05-10 NOTE — Patient Outreach (Signed)
 Complex Care Management   Visit Note  05/18/2024 updated note for 05/10/24  Name:  Luis Stewart MRN: [id number] DOB: 09/02/1951  Situation: Referral received for Complex Care Management related to pain management from neuropathy from oncology treatment for multiple myeloma I obtained verbal consent from Patient.  Visit completed with Luis Stewart  on the phone  Background:   Past Medical History:  Diagnosis Date   Arthritis    Asbestosis (HCC)    Closed fracture of distal end of fibula with tibia with routine healing 05/28/2020   COVID-19 09/03/2021   Cyst of kidney, acquired    Cystic disease of liver    Enlarged prostate    Headache    History of fracture of leg    Right, childhood   Hx of migraine headaches    Hyperlipidemia    Multiple myeloma (HCC)    Neuropathy    PE (pulmonary thromboembolism) (HCC)    After knee surgery   Prostate cancer (HCC) 2015   Adenocarcinoma by biopsy    Assessment: Patient Reported Symptoms:  Cognitive Cognitive Status: Able to follow simple commands, Alert and oriented to person, place, and time, Insightful and able to interpret abstract concepts, Normal speech and language skills Cognitive/Intellectual Conditions Management [RPT]: None reported or documented in medical history or problem list   Health Maintenance Behaviors: Annual physical exam, Healthy diet, Sleep adequate, Social activities, Spiritual practice(s), Stress management Healing Pattern: Average Health Facilitated by: Pain control, Prayer/meditation, Rest, Stress management  Neurological Neurological Review of Symptoms: Other: [place] Neurological Symptoms/Conditions [RPT]: neuropathy Neurological Self-Management Outcome: 4 (good) Neurological Comment: lumbar radiculopathy,  peripheral neuropathy  HEENT HEENT Symptoms Reported: Not assessed      Cardiovascular Cardiovascular Symptoms Reported: Not assessed    Respiratory Respiratory Symptoms Reported:  Not assesed    Endocrine Patient reports the following symptoms related to hypoglycemia or hyperglycemia : Not assessed    Gastrointestinal Gastrointestinal Symptoms Reported: Abdominal pain or discomfort Additional Gastrointestinal Details: at intevals has abdominal pain, cramping after his treatment Gastrointestinal Conditions: Abdominal pain, Reflux/heartburn Gastrointestinal Management Strategies: Adequate rest, Diet modification, Medication therapy Gastrointestinal Self-Management Outcome: 3 (uncertain) Nutrition Risk Screen (CP): No indicators present  Genitourinary Genitourinary Symptoms Reported: Not assessed    Integumentary Integumentary Symptoms Reported: Not assessed    Musculoskeletal Musculoskelatal Symptoms Reviewed: Not assessed        Psychosocial Psychosocial Symptoms Reported: No symptoms reported Behavioral Management Strategies: Adequate rest, Coping strategies, Support system Behavioral Health Self-Management Outcome: 4 (good) Major Change/Loss/Stressor/Fears (CP): Medical condition, self Techniques to Cope with Loss/Stress/Change: Diversional activities, Spiritual practice(s) Quality of Family Relationships: helpful, involved, supportive Do you feel physically threatened by others?: No      05/10/2024   11:02 AM  Depression screen PHQ 2/9  Decreased Interest 0  Down, Depressed, Hopeless 0  PHQ - 2 Score 0    There were no vitals filed for this visit.  Medications Reviewed Today     Reviewed by Arlyce Berger, RN (Registered Nurse) on 05/10/24 at 1105  Med List Status: <None>   Medication Order Taking? Sig Documenting Provider Last Dose Status Informant  acetaminophen  (TYLENOL ) 500 MG tablet [id number] Yes Take 1,000 mg by mouth every 6 (six) hours as needed for mild pain or moderate pain. [provider] Taking Active Self  acyclovir  (ZOVIRAX ) 400 MG tablet [id number] Yes TAKE 1 TABLET BY MOUTH TWICE A DAY Paulett Boros, MD Taking  Active   Cholecalciferol (VITAMIN D ) 2000 units CAPS [id number] Yes  Take 2,000 Units by mouth every other day. [provider] Taking Active Self           Med Note Raquel Cables, Bevelyn Bryant   Tue Apr 15, 2022  8:04 AM)    DULoxetine  (CYMBALTA ) 60 MG capsule [id number] Yes TAKE 1 CAPSULE BY MOUTH EVERY DAY Meldon Sport, MD Taking Active   ferrous sulfate 325 (65 FE) MG EC tablet [id number] Yes Take 325 mg by mouth daily. [provider] Taking Active   lenalidomide  (REVLIMID ) 15 MG capsule [id number] Yes Take 1 capsule (15 mg total) by mouth daily. [employer] Auth # [id number]   Date Obtained 04/20/2024 Katragadda, Sreedhar, MD Taking Active   Multiple Vitamin (MULTIVITAMIN WITH MINERALS) TABS tablet [id number] Yes Take 1 tablet by mouth daily with breakfast. Centrum Silver for Men [provider] Taking Active Self  nitroGLYCERIN  (NITROSTAT ) 0.4 MG SL tablet [id number] Yes Place 1 tablet (0.4 mg total) under the tongue every 5 (five) minutes x 3 doses as needed for chest pain (If no relief after 3rd dose, call or go to ED.). Lasalle Pointer, NP Taking Active   pantoprazole  (PROTONIX ) 40 MG tablet [id number] Yes TAKE 1 TABLET BY MOUTH EVERY OTHER DAY Patel, Rutwik K, MD Taking Active   potassium chloride  (KLOR-CON ) 10 MEQ tablet [id number] Yes Take 1 tablet (10 mEq total) by mouth daily. Lasalle Pointer, NP Taking Active   RESTASIS 0.05 % ophthalmic emulsion [id number] Yes Place 1 drop into both eyes 2 (two) times daily. [provider] Taking Active   rosuvastatin  (CRESTOR ) 5 MG tablet [id number] Yes TAKE 1 TABLET (5 MG TOTAL) BY MOUTH DAILY. Gerard Knight, MD Taking Active   tamsulosin  (FLOMAX ) 0.4 MG CAPS capsule [id number] Yes Take 0.4 mg by mouth every other day. [provider] Taking Active   Med List Note Thaddeus Filippo, RPH-CPP 12/28/23 1146): Revlimid  (lenalidomide ) filled at [facility]            Recommendation:   PCP  Follow-up Specialty provider follow-up June 25 with new podiatrist at [facility] ankle [facility] foot Outreach to [facility] as needed prior to outreach from new RN CCM  Follow Up Plan:   Telephone follow up appointment date/time:  06/10/24 at 1 pm with Grandville Lax, RN CCM   Anwar Crill L. Mcarthur Speedy, RN, BSN, CCM [place]  [facility], [facility] RN Care Manager Direct Dial: [phone]  Fax: [phone]

## 2024-05-12 ENCOUNTER — Ambulatory Visit: Admitting: Hematology

## 2024-05-12 ENCOUNTER — Inpatient Hospital Stay: Attending: Hematology

## 2024-05-12 ENCOUNTER — Inpatient Hospital Stay

## 2024-05-12 VITALS — BP 135/81 | HR 64 | Temp 97.9°F | Resp 18

## 2024-05-12 DIAGNOSIS — C9000 Multiple myeloma not having achieved remission: Secondary | ICD-10-CM | POA: Insufficient documentation

## 2024-05-12 DIAGNOSIS — Z95828 Presence of other vascular implants and grafts: Secondary | ICD-10-CM

## 2024-05-12 DIAGNOSIS — C61 Malignant neoplasm of prostate: Secondary | ICD-10-CM | POA: Insufficient documentation

## 2024-05-12 DIAGNOSIS — C7951 Secondary malignant neoplasm of bone: Secondary | ICD-10-CM | POA: Diagnosis not present

## 2024-05-12 LAB — COMPREHENSIVE METABOLIC PANEL WITH GFR
ALT: 20 U/L (ref 0–44)
AST: 22 U/L (ref 15–41)
Albumin: 3.3 g/dL — ABNORMAL LOW (ref 3.5–5.0)
Alkaline Phosphatase: 31 U/L — ABNORMAL LOW (ref 38–126)
Anion gap: 4 — ABNORMAL LOW (ref 5–15)
BUN: 17 mg/dL (ref 8–23)
CO2: 27 mmol/L (ref 22–32)
Calcium: 8.5 mg/dL — ABNORMAL LOW (ref 8.9–10.3)
Chloride: 108 mmol/L (ref 98–111)
Creatinine, Ser: 1.11 mg/dL (ref 0.61–1.24)
GFR, Estimated: >60 mL/min (ref >60)
Glucose, Bld: 88 mg/dL (ref 70–99)
Potassium: 3.7 mmol/L (ref 3.5–5.1)
Sodium: 139 mmol/L (ref 135–145)
Total Bilirubin: 0.6 mg/dL (ref 0.0–1.2)
Total Protein: 6 g/dL — ABNORMAL LOW (ref 6.5–8.1)

## 2024-05-12 MED ORDER — HEPARIN SOD (PORK) LOCK FLUSH 100 UNIT/ML IV SOLN
500.0000 [IU] | Freq: Once | INTRAVENOUS | Status: AC
  Administered 2024-05-12: 500 [IU] via INTRAVENOUS

## 2024-05-12 MED ORDER — DENOSUMAB 120 MG/1.7ML ~~LOC~~ SOLN
120.0000 mg | Freq: Once | SUBCUTANEOUS | Status: AC
  Administered 2024-05-12: 120 mg via SUBCUTANEOUS
  Filled 2024-05-12: qty 1.7

## 2024-05-12 MED ORDER — SODIUM CHLORIDE 0.9% FLUSH
10.0000 mL | Freq: Once | INTRAVENOUS | Status: AC
  Administered 2024-05-12: 10 mL via INTRAVENOUS

## 2024-05-12 NOTE — Patient Instructions (Signed)
 [facility] [place] - A DEPT OF [facility]  Discharge Instructions: Thank you for choosing [facility] to provide your oncology and hematology care.  If you have a lab appointment with the Cancer Center - please note that after April 8th, 2024, all labs will be drawn in the cancer center.  You do not have to check in or register with the main entrance as you have in the past but will complete your check-in in the cancer center.  Wear comfortable clothing and clothing appropriate for easy access to any Portacath or PICC line.   We strive to give you quality time with your provider. You may need to reschedule your appointment if you arrive late (15 or more minutes).  Arriving late affects you and other patients whose appointments are after yours.  Also, if you miss three or more appointments without notifying the office, you may be dismissed from the clinic at the provider's discretion.      For prescription refill requests, have your pharmacy contact our office and allow 72 hours for refills to be completed.    Today you received the Xgeva injection.     BELOW ARE SYMPTOMS THAT SHOULD BE REPORTED IMMEDIATELY: *FEVER GREATER THAN 100.4 F (38 C) OR HIGHER *CHILLS OR SWEATING *NAUSEA AND VOMITING THAT IS NOT CONTROLLED WITH YOUR NAUSEA MEDICATION *UNUSUAL SHORTNESS OF BREATH *UNUSUAL BRUISING OR BLEEDING *URINARY PROBLEMS (pain or burning when urinating, or frequent urination) *BOWEL PROBLEMS (unusual diarrhea, constipation, pain near the anus) TENDERNESS IN MOUTH AND THROAT WITH OR WITHOUT PRESENCE OF ULCERS (sore throat, sores in mouth, or a toothache) UNUSUAL RASH, SWELLING OR PAIN  UNUSUAL VAGINAL DISCHARGE OR ITCHING   Items with * indicate a potential emergency and should be followed up as soon as possible or go to the Emergency Department if any problems should occur.  Please show the CHEMOTHERAPY ALERT CARD or IMMUNOTHERAPY ALERT CARD at  check-in to the Emergency Department and triage nurse.  Should you have questions after your visit or need to cancel or reschedule your appointment, please contact [facility] [place] - A DEPT OF [facility] [phone]  and follow the prompts.  Office hours are 8:00 a.m. to 4:30 p.m. Monday - Friday. Please note that voicemails left after 4:00 p.m. may not be returned until the following business day.  We are closed weekends and major holidays. You have access to a nurse at all times for urgent questions. Please call the main number to the clinic [phone] and follow the prompts.  For any non-urgent questions, you may also contact your provider using MyChart. We now offer e-Visits for anyone 107 and older to request care online for non-urgent symptoms. For details visit [email or web address].   Also download the MyChart app! Go to the app store, search "MyChart", open the app, select [place], and log in with your MyChart username and password.

## 2024-05-12 NOTE — Progress Notes (Signed)
 Luis Stewart presents today for injection per the provider's orders.  Xgeva  120 mg administration without incident; injection site WNL; see MAR for injection details.  Patient tolerated procedure well and without incident.  No questions or complaints noted at this time. Patient's Calcium  noted to be 8.5 today.  Patient denies any tooth or jaw pain at this time. No recent or future dental appointments at this time. Patient reports taking Vitamin D /Calcium  supplements as directed.  Discharged from clinic ambulatory in stable condition. Alert and oriented x 3. F/U with [facility] as scheduled.

## 2024-05-18 NOTE — Patient Instructions (Signed)
 Visit Information  Thank you for taking time to visit with me today. Please don't hesitate to contact me if I can be of assistance to you before our next scheduled appointment.  Your next care management appointment is by telephone on 06/10/24 at 1 pm with Grandville Lax, RN CCM  Please feel free to outreach to this RN CM as needed before 06/10/24  Please call the care guide team at [phone] if you need to cancel, schedule, or reschedule an appointment.   Please call the Suicide and Crisis Lifeline: 988 call the [place]  National Suicide Prevention Lifeline: 782-574-5669 or TTY: 9302671953 TTY 807-511-1455) to talk to a trained counselor call 1-800-273-TALK (toll free, 24 hour hotline) call the [facility]: ([phone] call 911 if you are experiencing a Mental Health or Behavioral Health Crisis or need someone to talk to.  Allon Costlow L. Mcarthur Speedy, RN, BSN, CCM [place]  [facility], [facility] Health RN Care Manager Direct Dial: [phone]  Fax: [phone]

## 2024-05-25 ENCOUNTER — Other Ambulatory Visit: Payer: Self-pay | Admitting: Hematology

## 2024-05-25 DIAGNOSIS — C9000 Multiple myeloma not having achieved remission: Secondary | ICD-10-CM

## 2024-05-30 DIAGNOSIS — M79674 Pain in right toe(s): Secondary | ICD-10-CM | POA: Diagnosis not present

## 2024-05-30 DIAGNOSIS — M2011 Hallux valgus (acquired), right foot: Secondary | ICD-10-CM | POA: Diagnosis not present

## 2024-05-30 DIAGNOSIS — G629 Polyneuropathy, unspecified: Secondary | ICD-10-CM | POA: Diagnosis not present

## 2024-05-30 DIAGNOSIS — L851 Acquired keratosis [keratoderma] palmaris et plantaris: Secondary | ICD-10-CM | POA: Diagnosis not present

## 2024-06-02 ENCOUNTER — Inpatient Hospital Stay: Attending: Hematology

## 2024-06-02 DIAGNOSIS — C61 Malignant neoplasm of prostate: Secondary | ICD-10-CM | POA: Diagnosis present

## 2024-06-02 DIAGNOSIS — C7951 Secondary malignant neoplasm of bone: Secondary | ICD-10-CM | POA: Diagnosis present

## 2024-06-02 DIAGNOSIS — C9000 Multiple myeloma not having achieved remission: Secondary | ICD-10-CM | POA: Diagnosis present

## 2024-06-02 DIAGNOSIS — D508 Other iron deficiency anemias: Secondary | ICD-10-CM

## 2024-06-02 LAB — CBC WITH DIFFERENTIAL/PLATELET
Abs Immature Granulocytes: 0 10*3/uL (ref 0.00–0.07)
Basophils Absolute: 0 10*3/uL (ref 0.0–0.1)
Basophils Relative: 1 %
Eosinophils Absolute: 0 10*3/uL (ref 0.0–0.5)
Eosinophils Relative: 2 %
HCT: 31.9 % — ABNORMAL LOW (ref 39.0–52.0)
Hemoglobin: 10.6 g/dL — ABNORMAL LOW (ref 13.0–17.0)
Lymphocytes Relative: 45 %
Lymphs Abs: 0.8 10*3/uL (ref 0.7–4.0)
MCH: 31.5 pg (ref 26.0–34.0)
MCHC: 33.2 g/dL (ref 30.0–36.0)
MCV: 94.7 fL (ref 80.0–100.0)
Monocytes Absolute: 0.2 10*3/uL (ref 0.1–1.0)
Monocytes Relative: 11 %
Neutro Abs: 0.7 10*3/uL — ABNORMAL LOW (ref 1.7–7.7)
Neutrophils Relative %: 41 %
Platelets: 121 10*3/uL — ABNORMAL LOW (ref 150–400)
RBC: 3.37 MIL/uL — ABNORMAL LOW (ref 4.22–5.81)
RDW: 16.3 % — ABNORMAL HIGH (ref 11.5–15.5)
WBC: 1.8 10*3/uL — ABNORMAL LOW (ref 4.0–10.5)
nRBC: 0 % (ref 0.0–0.2)

## 2024-06-02 LAB — IRON AND TIBC
Iron: 45 ug/dL (ref 45–182)
Saturation Ratios: 14 % — ABNORMAL LOW (ref 17.9–39.5)
TIBC: 319 ug/dL (ref 250–450)
UIBC: 274 ug/dL

## 2024-06-02 LAB — FERRITIN: Ferritin: 398 ng/mL — ABNORMAL HIGH (ref 24–336)

## 2024-06-02 LAB — COMPREHENSIVE METABOLIC PANEL WITH GFR
ALT: 27 U/L (ref 0–44)
AST: 31 U/L (ref 15–41)
Albumin: 3.3 g/dL — ABNORMAL LOW (ref 3.5–5.0)
Alkaline Phosphatase: 32 U/L — ABNORMAL LOW (ref 38–126)
Anion gap: 9 (ref 5–15)
BUN: 20 mg/dL (ref 8–23)
CO2: 24 mmol/L (ref 22–32)
Calcium: 8.5 mg/dL — ABNORMAL LOW (ref 8.9–10.3)
Chloride: 104 mmol/L (ref 98–111)
Creatinine, Ser: 1.12 mg/dL (ref 0.61–1.24)
GFR, Estimated: >60 mL/min (ref >60)
Glucose, Bld: 89 mg/dL (ref 70–99)
Potassium: 3.8 mmol/L (ref 3.5–5.1)
Sodium: 137 mmol/L (ref 135–145)
Total Bilirubin: 0.6 mg/dL (ref 0.0–1.2)
Total Protein: 6.7 g/dL (ref 6.5–8.1)

## 2024-06-02 LAB — MAGNESIUM: Magnesium: 1.9 mg/dL (ref 1.7–2.4)

## 2024-06-02 MED ORDER — SODIUM CHLORIDE 0.9% FLUSH
10.0000 mL | INTRAVENOUS | Status: AC
  Administered 2024-06-02: 10 mL via INTRAVENOUS

## 2024-06-02 MED ORDER — HEPARIN SOD (PORK) LOCK FLUSH 100 UNIT/ML IV SOLN
500.0000 [IU] | Freq: Once | INTRAVENOUS | Status: AC
  Administered 2024-06-02: 500 [IU] via INTRAVENOUS

## 2024-06-02 NOTE — Progress Notes (Signed)
 Luis Stewart presented for Portacath access and flush.  Portacath located left chest wall accessed with  H 20 needle.  No blood return noted. Labs drawn peripheral. Port flushed with 20ml NS and 500U/41ml Heparin  and needle removed intact.  Procedure tolerated well and without incident.   Discharged from clinic ambulatory in stable condition. Alert and oriented x 3. F[facility]U with [facility] as scheduled.

## 2024-06-02 NOTE — Patient Instructions (Signed)
 [facility] [place] - A DEPT OF [facility]  Discharge Instructions: Thank you for choosing [facility] to provide your oncology and hematology care.  If you have a lab appointment with the Cancer Center - please note that after April 8th, 2024, all labs will be drawn in the cancer center.  You do not have to check in or register with the main entrance as you have in the past but will complete your check-in in the cancer center.  Wear comfortable clothing and clothing appropriate for easy access to any Portacath or PICC line.   We strive to give you quality time with your provider. You may need to reschedule your appointment if you arrive late (15 or more minutes).  Arriving late affects you and other patients whose appointments are after yours.  Also, if you miss three or more appointments without notifying the office, you may be dismissed from the clinic at the provider's discretion.      For prescription refill requests, have your pharmacy contact our office and allow 72 hours for refills to be completed.    Today you received the following chemotherapy and/or immunotherapy agents port flush with labs.       To help prevent nausea and vomiting after your treatment, we encourage you to take your nausea medication as directed.  BELOW ARE SYMPTOMS THAT SHOULD BE REPORTED IMMEDIATELY: *FEVER GREATER THAN 100.4 F (38 C) OR HIGHER *CHILLS OR SWEATING *NAUSEA AND VOMITING THAT IS NOT CONTROLLED WITH YOUR NAUSEA MEDICATION *UNUSUAL SHORTNESS OF BREATH *UNUSUAL BRUISING OR BLEEDING *URINARY PROBLEMS (pain or burning when urinating, or frequent urination) *BOWEL PROBLEMS (unusual diarrhea, constipation, pain near the anus) TENDERNESS IN MOUTH AND THROAT WITH OR WITHOUT PRESENCE OF ULCERS (sore throat, sores in mouth, or a toothache) UNUSUAL RASH, SWELLING OR PAIN  UNUSUAL VAGINAL DISCHARGE OR ITCHING   Items with * indicate a potential emergency and should  be followed up as soon as possible or go to the Emergency Department if any problems should occur.  Please show the CHEMOTHERAPY ALERT CARD or IMMUNOTHERAPY ALERT CARD at check-in to the Emergency Department and triage nurse.  Should you have questions after your visit or need to cancel or reschedule your appointment, please contact [facility] [place] - A DEPT OF [facility] [phone]  and follow the prompts.  Office hours are 8:00 a.m. to 4:30 p.m. Monday - Friday. Please note that voicemails left after 4:00 p.m. may not be returned until the following business day.  We are closed weekends and major holidays. You have access to a nurse at all times for urgent questions. Please call the main number to the clinic [phone] and follow the prompts.  For any non-urgent questions, you may also contact your provider using MyChart. We now offer e-Visits for anyone 72 and older to request care online for non-urgent symptoms. For details visit [email or web address].   Also download the MyChart app! Go to the app store, search "MyChart", open the app, select [place], and log in with your MyChart username and password.

## 2024-06-03 LAB — KAPPA/LAMBDA LIGHT CHAINS
Kappa free light chain: 41.1 mg/L — ABNORMAL HIGH (ref 3.3–19.4)
Kappa, lambda light chain ratio: 1.85 — ABNORMAL HIGH (ref 0.26–1.65)
Lambda free light chains: 22.2 mg/L (ref 5.7–26.3)

## 2024-06-05 LAB — IMMUNOFIXATION ELECTROPHORESIS
IgA: 35 mg/dL — ABNORMAL LOW (ref 61–437)
IgG (Immunoglobin G), Serum: 875 mg/dL (ref 603–1613)
IgM (Immunoglobulin M), Srm: 14 mg/dL — ABNORMAL LOW (ref 15–143)
Total Protein ELP: 6.1 g/dL (ref 6.0–8.5)

## 2024-06-06 LAB — PROTEIN ELECTROPHORESIS, SERUM
A/G Ratio: 1.1 (ref 0.7–1.7)
Albumin ELP: 3.2 g/dL (ref 2.9–4.4)
Alpha-1-Globulin: 0.3 g/dL (ref 0.0–0.4)
Alpha-2-Globulin: 0.9 g/dL (ref 0.4–1.0)
Beta Globulin: 0.9 g/dL (ref 0.7–1.3)
Gamma Globulin: 0.8 g/dL (ref 0.4–1.8)
Globulin, Total: 2.8 g/dL (ref 2.2–3.9)
M-Spike, %: 0.5 g/dL — ABNORMAL HIGH
Total Protein ELP: 6 g/dL (ref 6.0–8.5)

## 2024-06-08 NOTE — Progress Notes (Signed)
 [facility] [address], [place] [address]    Clinic Day:  06/08/24   Referring physician: [name], MD  Patient Care Team: Luis Suzzane POUR, MD as PCP - General (Internal Medicine) Luis Jayson MATSU, MD as PCP - Cardiology (Cardiology) Luis [name], MD as Consulting Physician (Gastroenterology) Luis Hai, MD as Medical Oncologist (Medical Oncology) Luis Joesph SQUIBB, RN as Oncology Nurse Navigator (Medical Oncology) Luis Rosina HERO, RN as VBCI Care Management   ASSESSMENT & PLAN:   Assessment: 1. Prostate cancer: - TRUS/Bx on 06/14/2015: 1/12 cores positive for adenocarcinoma-10% of the core in the right lateral base revealed Gleason 3+3=6.  Prostate volume 175 cc.  PSA was 16.6.  PSA density 0.09. - Seen by Luis Stewart in [facility] for robotic prostatectomy.  Due to low-volume prostate cancer and very low PSA density, it was recommended that he continue active surveillance. - 01/21/2016: Surveillance TRUS/biopsy, following a prostatic MRI which revealed no suspicious prostatic lesions and correlated the patient's large prostate.  All 12 cores were negative for prostate cancer.  Prostate volume was 190 mL. - 08/11/2018: Fusion TRUS/biopsy: Recent prostate MRI-volume 220 mL, no significant lesions.  No evidence of transcapsular/.  Ureteral, SV/bony or lymphatic disease.  Path-1 core (left apex) revealed Gleason 3+3 and 5% of core. - PSA: 17.16 (10/09/2015), 15.4 (11/04/2016), 17.9 (05/15/2017), 18.4 (04/19/2018), 15.9 (02/15/2019), 15.5 (03/20/2020), 22.4 (03/20/2021) - MRI of the lumbar spine with and without contrast on 11/27/2021 done for back pain showed expanded appearance of the sacrum with diffusely abnormal bone marrow signal and contrast-enhancement concerning for metastatic disease. - CT pelvis with contrast on 11/27/2021: Expansile, heterogeneous trabeculated appearance of the sacrum with thickening of the cortex, unchanged from multiple  prior exams.  No evidence of pelvic lymphadenopathy.  Extreme prostatomegaly. -PSMA PET scan (03/13/2022): Markedly enlarged prostate gland with focal activity inferior left aspect of the gland suggesting prostatic adenocarcinoma.  No nodal metastasis in the abdomen or pelvis.  Multifocal skeletal metastasis in the mid sacrum, right sacral ala, subtle lesions in the L2 and several rib lesions. - Degarelix  started on 04/15/2022   2. Social/family history: - Lives at home with his wife.  Retired in 2003 from [employer].  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  Non-smoker. - Sister had cholangiocarcinoma.  Paternal first cousin had metastatic cancer.  Paternal uncle had prostate cancer.  3.  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  Core biopsy demonstrates hypercellular marrow (95%) with large atypical plasma cells arranged in sheets, by CD138 IHC plasma cells comprise 70% of the cellular elements, kappa restricted.  Amyloid was negative. 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  Chronic progressive changes involving the sacrum, left iliac bone including accentuation of trabecular markings and cortical thickening with low FDG uptake suggestive of chronic Paget's disease.  Marked prostate gland enlargement. - BMBX (03/20/2022):Hypercellular bone marrow with at least 25% plasma cells on aspirate with atypical features.  Core biopsy demonstrates hypercellular marrow for age (95%) with large atypical plasma cells arranged in sheets.  By CD138 IHC, plasma cells comprise approximately 70% of cellular elements and are kappa restricted.  Amyloid deposition was negative by Congo red.  Karyotype showed 20 q. deletion.  Myeloma FISH panel showed complex abnormalities. - Dara KRD started on 04/15/2022, 8 cycles completed. - Stem cell collection was completed at [facility].  Patient decided to do transplant at first relapse.    Plan: 1. 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  He has occasional hot flashes.  Last PSA improved to 0.54.  Will repeat PSA at next visit.  2.  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  He started last cycle on 06/06/2024.  He reports some cough with clear expectoration.  He does have on and off diarrhea with Revlimid .  He also has itching with first few pills after starting each cycle.  He takes Atarax  for it. - I reviewed labs from 06/02/2024: Creatinine normal.  M spike increased to 0.5 g.  Kappa light chains increased to 41 from 18.  FLC ratio is 1.85.  Immunofixation was positive for IgG kappa. - He is having slow progression.  I have recommended PET/CT scan.  Last Darzalex  was in June 2024.  Last carfilzomib  was in 2023. - We will pick a regimen based on disease status on the PET scan.   3.  Normocytic anemia: - He is taking iron tablet daily.  Ferritin is 398 and saturation 14.  Hemoglobin is 10.6.  4.  Osteopenia (DEXA scan 06/04/2022: T score -2.1): - Calcium  is 8.5 with albumin 3.3.  Continue denosumab  today and monthly.    No orders of the defined types were placed in this encounter.     Luis Stewart,acting as a Neurosurgeon for Luis Stands, MD.,have documented all relevant documentation on the behalf of Luis Stands, MD,as directed by  Luis Stands, MD while in the presence of Luis Stands, MD.  I, Luis Stands MD, have reviewed the above documentation for accuracy and completeness, and I agree with the above.      Luis Stewart   6/18/202512:32 PM  CHIEF COMPLAINT:    Diagnosis: prostate cancer and multiple myeloma    Cancer Staging  Multiple myeloma (HCC) Staging form: Plasma Cell Myeloma and Plasma Cell Disorders, AJCC 8th Edition - Clinical: No stage assigned - Unsigned    Prior Therapy: Carfilzomib  (20/70) + Daratumumab  SQ + Dexamethasone  (20/40) DaraKRd q28d   Current Therapy:  Eligard ; Revlimid  15 mg 3 weeks on/1 week of    HISTORY OF PRESENT ILLNESS:   Oncology History  Multiple myeloma (HCC)  04/09/2022 Initial Diagnosis   Multiple myeloma (HCC)   04/15/2022 - 11/11/2022 Chemotherapy   Patient is on Treatment Plan : MYELOMA RELAPSED/REFRACTORY Carfilzomib  (20/70) + Daratumumab  SQ + Dexamethasone  (20/40) DaraKd q28d        INTERVAL HISTORY:   Luis Stewart is a 73 y.o. male presenting to clinic today for follow up of prostate cancer and multiple myeloma. He was last seen by me on 03/24/24.  Today, he states that he is doing well overall. His appetite level is at 70%. His energy level is at 70%.   PAST MEDICAL HISTORY:   Past Medical History: Past Medical History:  Diagnosis Date   Arthritis    Asbestosis (HCC)    Closed fracture of distal end of fibula with tibia with routine healing 05/28/2020   COVID-19 09/03/2021   Cyst of kidney, acquired    Cystic disease of liver    Enlarged prostate    Headache    History of  fracture of leg    Right, childhood   Hx of migraine headaches    Hyperlipidemia    Multiple myeloma (HCC)    Neuropathy    PE (pulmonary thromboembolism) (HCC)    After knee surgery   Prostate cancer (HCC) 2015   Adenocarcinoma by biopsy    Surgical History: Past Surgical History:  Procedure Laterality Date   BIOPSY PROSTATE  2003 and 2005   COLONOSCOPY  11/28/2008   Dr. Rourk:internal hemorrhoids/tortus colon/ascending colon polyps, tubular adenomas   COLONOSCOPY N/A 09/04/2014   Procedure: COLONOSCOPY;  Surgeon: Lamar CHRISTELLA Hollingshead, MD;  [facility];  Service: Endoscopy;  Laterality: N/A;  9:30    COLONOSCOPY N/A 07/17/2017   Procedure: COLONOSCOPY;  Surgeon: Harvey Margo CROME, MD;  [facility];  Service: Endoscopy;  Laterality: N/A;   COLONOSCOPY WITH PROPOFOL  N/A 10/05/2023   Procedure: COLONOSCOPY WITH PROPOFOL ;  Surgeon: Hollingshead Lamar CHRISTELLA, MD;  [facility]: [facility];  Service: Endoscopy;  Laterality: N/A;  10:00AM, ASA 3   ESOPHAGOGASTRODUODENOSCOPY N/A 07/16/2017   Procedure: ESOPHAGOGASTRODUODENOSCOPY (EGD);  Surgeon: Harvey Margo CROME, MD;  [facility];  Service: Endoscopy;  Laterality: N/A;   GIVENS CAPSULE STUDY  07/16/2017   Procedure: GIVENS CAPSULE STUDY;  Surgeon: Harvey Margo CROME, MD;  [facility]: [facility];  Service: Endoscopy;;   JOINT REPLACEMENT N/A    Phreesia 01/12/2021   POLYPECTOMY  07/17/2017   Procedure: POLYPECTOMY;  Surgeon: Harvey Margo CROME, MD;  [facility]: [facility];  Service: Endoscopy;;  cecal   POLYPECTOMY  10/05/2023   Procedure: POLYPECTOMY;  Surgeon: Hollingshead Lamar CHRISTELLA, MD;  [facility];  Service: Endoscopy;;   PORTACATH PLACEMENT Left 04/18/2022   Procedure: INSERTION PORT-A-CATH;  Surgeon: Mavis Anes, MD;  Location: [facility];  Service: General;  Laterality: Left;   TOTAL KNEE ARTHROPLASTY Right 06/10/2017   TOTAL KNEE ARTHROPLASTY Right 06/10/2017   Procedure: TOTAL KNEE ARTHROPLASTY;  Surgeon: Beverley Toribio FALCON, MD;  Location: [facility] OR;  Service: Orthopedics;  Laterality: Right;    Social History: Social History   Socioeconomic History   Marital status: Married    Spouse name: Idell caldron   Number of children: Not on file   Years of education: Not on file   Highest education level: Not on file  Occupational History   Occupation: Retired  Tobacco Use   Smoking status: Never   Smokeless tobacco: Never  Vaping Use   Vaping status: Never Used  Substance and Sexual Activity   Alcohol use: No   Drug use: No   Sexual activity: Yes  Other Topics Concern   Not on file  Social History Narrative   Previously worked at [employer]   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   Social Drivers of Corporate investment banker Strain: Low Risk  (04/12/2024)   Overall Financial Resource Strain (CARDIA)    Difficulty of Paying Living Expenses: Not hard at all  Food Insecurity: No Food Insecurity (04/12/2024)   Hunger Vital Sign    Worried About Running Out of Food in the Last Year: Never true    Ran Out of Food in the Last Year: Never true  Transportation Needs: No Transportation Needs (04/12/2024)   PRAPARE - Administrator, Civil Service (Medical): No    Lack of Transportation (Non-Medical): No  Physical Activity: Sufficiently Active (04/12/2024)   Exercise Vital Sign    Days of Exercise  per Week: 7 days    Minutes of Exercise per Session: 30 min  Stress: No Stress Concern Present (04/12/2024)   [employer] of Occupational Health - Occupational Stress Questionnaire    Feeling of Stress : Not at all  Social Connections: Socially Integrated (04/12/2024)   Social Connection and Isolation Panel    Frequency of Communication with Friends and Family: More than three times a week    Frequency of Social Gatherings with Friends and Family: More than three times a week    Attends Religious Services: More than 4 times per year    Active Member of [employer] or Organizations: Yes    Attends Engineer, structural: More than 4 times per year    Marital Status: Married  Catering manager Violence: Not At Risk (04/12/2024)   Humiliation, Afraid, Rape, and Kick questionnaire    Fear of Current or Ex-Partner: No    Emotionally Abused: No    Physically Abused: No    Sexually Abused: No    Family History: Family History  Problem Relation Age of Onset   Diabetes Mother    Hypertension Mother    Obesity Mother    Heart disease Mother    Mental illness Mother    Hypertension Sister    Obesity Sister    Arthritis Sister    Deep vein thrombosis Father    Dementia Father     Heart disease Father        CABG   Colon cancer Neg Hx     Current Medications:  Current Outpatient Medications:    acetaminophen  (TYLENOL ) 500 MG tablet, Take 1,000 mg by mouth every 6 (six) hours as needed for mild pain or moderate pain., Disp: , Rfl:    acyclovir  (ZOVIRAX ) 400 MG tablet, TAKE 1 TABLET BY MOUTH TWICE A DAY, Disp: 180 tablet, Rfl: 2   Cholecalciferol (VITAMIN D ) 2000 units CAPS, Take 2,000 Units by mouth every other day., Disp: , Rfl:    DULoxetine  (CYMBALTA ) 60 MG capsule, TAKE 1 CAPSULE BY MOUTH EVERY DAY, Disp: 90 capsule, Rfl: 2   ferrous sulfate 325 (65 FE) MG EC tablet, Take 325 mg by mouth daily., Disp: , Rfl:    lenalidomide  (REVLIMID ) 15 MG capsule, Take 1 15 mg capsule 21 days on and 7 days off, Disp: 21 capsule, Rfl: 0   Multiple Vitamin (MULTIVITAMIN WITH MINERALS) TABS tablet, Take 1 tablet by mouth daily with breakfast. Centrum Silver for Men, Disp: , Rfl:    nitroGLYCERIN  (NITROSTAT ) 0.4 MG SL tablet, Place 1 tablet (0.4 mg total) under the tongue every 5 (five) minutes x 3 doses as needed for chest pain (If no relief after 3rd dose, call or go to ED.)., Disp: 30 tablet, Rfl: 0   pantoprazole  (PROTONIX ) 40 MG tablet, TAKE 1 TABLET BY MOUTH EVERY OTHER DAY, Disp: 45 tablet, Rfl: 1   potassium chloride  (KLOR-CON ) 10 MEQ tablet, Take 1 tablet (10 mEq total) by mouth daily., Disp: 90 tablet, Rfl: 1   RESTASIS 0.05 % ophthalmic emulsion, Place 1 drop into both eyes 2 (two) times daily., Disp: , Rfl:    rosuvastatin  (CRESTOR ) 5 MG tablet, TAKE 1 TABLET (5 MG TOTAL) BY MOUTH DAILY., Disp: 90 tablet, Rfl: 3   tamsulosin  (FLOMAX ) 0.4 MG CAPS capsule, Take 0.4 mg by mouth every other day., Disp: , Rfl:    Allergies: Allergies  Allergen Reactions   Sulfa Antibiotics Other (See Comments)    Unknown reaction. Childhood reaction  REVIEW OF SYSTEMS:   Review of Systems  Constitutional:  Negative for chills, fatigue and fever.  HENT:   Negative for lump/mass, mouth  sores, nosebleeds, sore throat and trouble swallowing.   Eyes:  Negative for eye problems.  Respiratory:  Positive for cough. Negative for shortness of breath.   Cardiovascular:  Negative for chest pain, leg swelling and palpitations.  Gastrointestinal:  Positive for diarrhea. Negative for abdominal pain, constipation, nausea and vomiting.  Genitourinary:  Negative for bladder incontinence, difficulty urinating, dysuria, frequency, hematuria and nocturia.   Musculoskeletal:  Negative for arthralgias, back pain, flank pain, myalgias and neck pain.  Skin:  Negative for itching and rash.  Neurological:  Positive for numbness. Negative for dizziness and headaches.  Hematological:  Does not bruise/bleed easily.  Psychiatric/Behavioral:  Negative for depression, sleep disturbance and suicidal ideas. The patient is not nervous/anxious.   All other systems reviewed and are negative.    VITALS:   There were no vitals taken for this visit.  Wt Readings from Last 3 Encounters:  03/22/24 190 lb (86.2 kg)  03/17/24 191 lb 2.2 oz (86.7 kg)  03/15/24 192 lb 12.8 oz (87.5 kg)    There is no height or weight on file to calculate BMI.  Performance status (ECOG): 1 - Symptomatic but completely ambulatory  PHYSICAL EXAM:   Physical Exam Vitals and nursing note reviewed. Exam conducted with a chaperone present.  Constitutional:      Appearance: Normal appearance.   Cardiovascular:     Rate and Rhythm: Normal rate and regular rhythm.     Pulses: Normal pulses.     Heart sounds: Normal heart sounds.  Pulmonary:     Effort: Pulmonary effort is normal.     Breath sounds: Normal breath sounds.  Abdominal:     Palpations: Abdomen is soft. There is no hepatomegaly, splenomegaly or mass.     Tenderness: There is no abdominal tenderness.   Musculoskeletal:     Right lower leg: No edema.     Left lower leg: No edema.  Lymphadenopathy:     Cervical: No cervical adenopathy.     Right cervical: No  superficial, deep or posterior cervical adenopathy.    Left cervical: No superficial, deep or posterior cervical adenopathy.     Upper Body:     Right upper body: No supraclavicular or axillary adenopathy.     Left upper body: No supraclavicular or axillary adenopathy.   Neurological:     General: No focal deficit present.     Mental Status: He is alert and oriented to person, place, and time.   Psychiatric:        Mood and Affect: Mood normal.        Behavior: Behavior normal.     LABS:      Latest Ref Rng & Units 06/02/2024   11:56 AM 04/14/2024    9:15 AM 03/17/2024    9:19 AM  CBC  WBC 4.0 - 10.5 K/uL 1.8  2.0  1.9   Hemoglobin 13.0 - 17.0 g/dL 89.3  89.7  89.3   Hematocrit 39.0 - 52.0 % 31.9  31.2  31.8   Platelets 150 - 400 K/uL 121  130  111       Latest Ref Rng & Units 06/02/2024   11:56 AM 05/12/2024   10:19 AM 04/14/2024    9:15 AM  CMP  Glucose 70 - 99 mg/dL 89  88  99   BUN 8 - 23 mg/dL 20  17  17   Creatinine 0.61 - 1.24 mg/dL 8.87  8.88  8.93   Sodium 135 - 145 mmol/L 137  139  139   Potassium 3.5 - 5.1 mmol/L 3.8  3.7  3.5   Chloride 98 - 111 mmol/L 104  108  105   CO2 22 - 32 mmol/L 24  27  25    Calcium  8.9 - 10.3 mg/dL 8.5  8.5  8.6   Total Protein 6.5 - 8.1 g/dL 6.7  6.0  5.7   Total Bilirubin 0.0 - 1.2 mg/dL 0.6  0.6  0.6   Alkaline Phos 38 - 126 U/L 32  31  32   AST 15 - 41 U/L 31  22  21    ALT 0 - 44 U/L 27  20  20       No results found for: CEA1, CEA / No results found for: CEA1, CEA Lab Results  Component Value Date   PSA1 22.5 (H) 11/05/2021   No results found for: CAN199 No results found for: RJW874  Lab Results  Component Value Date   TOTALPROTELP 6.0 06/02/2024   TOTALPROTELP 6.1 06/02/2024   ALBUMINELP 3.2 06/02/2024   A1GS 0.3 06/02/2024   A2GS 0.9 06/02/2024   BETS 0.9 06/02/2024   GAMS 0.8 06/02/2024   MSPIKE 0.5 (H) 06/02/2024   SPEI Comment 06/02/2024   Lab Results  Component Value Date   TIBC 319  06/02/2024   TIBC 392 09/24/2023   TIBC 441 07/16/2023   FERRITIN 398 (H) 06/02/2024   FERRITIN 51 12/24/2023   FERRITIN 49 09/24/2023   IRONPCTSAT 14 (L) 06/02/2024   IRONPCTSAT 15 (L) 09/24/2023   IRONPCTSAT 11 (L) 07/16/2023   Lab Results  Component Value Date   LDH 142 10/21/2022   LDH 131 08/26/2022   LDH 124 07/29/2022     STUDIES:   No results found.

## 2024-06-09 ENCOUNTER — Inpatient Hospital Stay: Admitting: Hematology

## 2024-06-09 ENCOUNTER — Inpatient Hospital Stay

## 2024-06-09 VITALS — BP 138/81 | HR 58 | Temp 98.9°F | Resp 17 | Ht 75.0 in | Wt 185.0 lb

## 2024-06-09 DIAGNOSIS — C9000 Multiple myeloma not having achieved remission: Secondary | ICD-10-CM

## 2024-06-09 DIAGNOSIS — C61 Malignant neoplasm of prostate: Secondary | ICD-10-CM

## 2024-06-09 DIAGNOSIS — C7951 Secondary malignant neoplasm of bone: Secondary | ICD-10-CM | POA: Diagnosis not present

## 2024-06-09 MED ORDER — DENOSUMAB 120 MG/1.7ML ~~LOC~~ SOLN
120.0000 mg | Freq: Once | SUBCUTANEOUS | Status: AC
  Administered 2024-06-09: 120 mg via SUBCUTANEOUS

## 2024-06-09 NOTE — Progress Notes (Signed)
 Patient tolerated injection with no complaints voiced.  Site clean and dry with no bruising or swelling noted at site.  See MAR for details.  Band aid applied.  Patient stable during and after injection.  Vss with discharge and left in satisfactory condition with no s/s of distress noted.

## 2024-06-09 NOTE — Patient Instructions (Signed)

## 2024-06-09 NOTE — Patient Instructions (Signed)
 [facility] at [facility] Discharge Instructions   You were seen and examined today by Dr. Cheree Cords.  He reviewed the results of your lab work which are mostly normal/stable. Your m-spike has been gradually increasing. Dr. Linnell Richardson would like to obtain a PET scan on you to see where we should go as far as treatment for your multiple myeloma.   We will see you back after the PET scan to review those results.   Return as scheduled.    Thank you for choosing [facility] at [facility] to provide your oncology and hematology care.  To afford each patient quality time with our provider, please arrive at least 15 minutes before your scheduled appointment time.   If you have a lab appointment with the Cancer Center please come in thru the Main Entrance and check in at the main information desk.  You need to re-schedule your appointment should you arrive 10 or more minutes late.  We strive to give you quality time with our providers, and arriving late affects you and other patients whose appointments are after yours.  Also, if you no show three or more times for appointments you may be dismissed from the clinic at the providers discretion.     Again, thank you for choosing [facility].  Our hope is that these requests will decrease the amount of time that you wait before being seen by our physicians.       _____________________________________________________________  Should you have questions after your visit to [facility], please contact our office at [phone] and follow the prompts.  Our office hours are 8:00 a.m. and 4:30 p.m. Monday - Friday.  Please note that voicemails left after 4:00 p.m. may not be returned until the following business day.  We are closed weekends and major holidays.  You do have access to a nurse 24-7, just call the main number to the clinic [phone] and do not press any options, hold on the line and  a nurse will answer the phone.    For prescription refill requests, have your pharmacy contact our office and allow 72 hours.    Due to Covid, you will need to wear a mask upon entering the hospital. If you do not have a mask, a mask will be given to you at the Main Entrance upon arrival. For doctor visits, patients may have 1 support person age 53 or older with them. For treatment visits, patients can not have anyone with them due to social distancing guidelines and our immunocompromised population.

## 2024-06-10 ENCOUNTER — Other Ambulatory Visit: Payer: Self-pay | Admitting: *Deleted

## 2024-06-10 ENCOUNTER — Other Ambulatory Visit: Payer: Self-pay

## 2024-06-10 NOTE — Patient Outreach (Signed)
 Complex Care Management   Visit Note  06/10/2024  Name:  Luis Stewart MRN: [id number] DOB: 07-26-1951  Situation: Referral received for Complex Care Management related to care coordination I obtained verbal consent from Patient.  Visit completed with patient  on the phone  Background:   Past Medical History:  Diagnosis Date   Arthritis    Asbestosis (HCC)    Closed fracture of distal end of fibula with tibia with routine healing 05/28/2020   COVID-19 09/03/2021   Cyst of kidney, acquired    Cystic disease of liver    Enlarged prostate    Headache    History of fracture of leg    Right, childhood   Hx of migraine headaches    Hyperlipidemia    Multiple myeloma (HCC)    Neuropathy    PE (pulmonary thromboembolism) (HCC)    After knee surgery   Prostate cancer (HCC) 2015   Adenocarcinoma by biopsy    Assessment: Patient Reported Symptoms:  Cognitive Cognitive Status: Alert and oriented to person, place, and time, Able to follow simple commands, Normal speech and language skills Cognitive/Intellectual Conditions Management [RPT]: None reported or documented in medical history or problem list   Health Maintenance Behaviors: Annual physical exam  Neurological Neurological Review of Symptoms: Numbness Oher Neurological Symptoms/Conditions [RPT]: neuropathy Neurological Comment: peripheral neuropathy  HEENT HEENT Symptoms Reported: Dryness HEENT Self-Management Outcome: 4 (good)    Cardiovascular Cardiovascular Symptoms Reported: No symptoms reported Does patient have uncontrolled Hypertension?: No Cardiovascular Conditions: High blood cholesterol Cardiovascular Management Strategies: Medication therapy  Respiratory Respiratory Symptoms Reported: No symptoms reported Respiratory Conditions: Seasonal allergies  Endocrine Patient reports the following symptoms related to hypoglycemia or hyperglycemia : No symptoms reported Is patient diabetic?: No Is patient  checking blood sugars at home?: No Endocrine Conditions: Vitamin D  deficiency Endocrine Management Strategies: Routine screening  Gastrointestinal Gastrointestinal Symptoms Reported: No symptoms reported Gastrointestinal Conditions: Diarrhea Gastrointestinal Comment: diarrhea due to medication Nutrition Risk Screen (CP): No indicators present  Genitourinary Genitourinary Symptoms Reported: No symptoms reported Genitourinary Conditions: Prostate cancer  Integumentary Integumentary Symptoms Reported: No symptoms reported    Musculoskeletal Musculoskelatal Symptoms Reviewed: No symptoms reported   Falls in the past year?: Yes Number of falls in past year: 1 or less Was there an injury with Fall?: No Fall Risk Category Calculator: 1 Patient Fall Risk Level: Low Fall Risk Patient at Risk for Falls Due to: History of fall(s) Fall risk Follow up: Falls evaluation completed  Psychosocial Psychosocial Symptoms Reported: No symptoms reported     Quality of Family Relationships: helpful, involved, supportive Do you feel physically threatened by others?: No      06/10/2024    1:21 PM  Depression screen PHQ 2/9  Decreased Interest 0  Down, Depressed, Hopeless 0  PHQ - 2 Score 0    There were no vitals filed for this visit.  Medications Reviewed Today     Reviewed by Remona Carmel, RN (Registered Nurse) on 06/10/24 at 1314  Med List Status: <None>   Medication Order Taking? Sig Documenting Provider Last Dose Status Informant  acetaminophen  (TYLENOL ) 500 MG tablet [id number] Yes Take 1,000 mg by mouth every 6 (six) hours as needed for mild pain or moderate pain. [provider]  Active Self  acyclovir  (ZOVIRAX ) 400 MG tablet [id number] Yes TAKE 1 TABLET BY MOUTH TWICE A DAY Katragadda, Sreedhar, MD  Active   Cholecalciferol (VITAMIN D ) 2000 units CAPS [id number] Yes Take 2,000 Units by mouth every other  day. [provider]  Active Self           Med Note Raquel Cables,  Bevelyn Bryant   Tue Apr 15, 2022  8:04 AM)    DULoxetine  (CYMBALTA ) 60 MG capsule [id number] Yes TAKE 1 CAPSULE BY MOUTH EVERY DAY Meldon Sport, MD  Active   ferrous sulfate 325 (65 FE) MG EC tablet [id number] Yes Take 325 mg by mouth daily. [provider]  Active   lenalidomide  (REVLIMID ) 15 MG capsule [id number] Yes Take 1 15 mg capsule 21 days on and 7 days off Katragadda, Sreedhar, MD  Active   Multiple Vitamin (MULTIVITAMIN WITH MINERALS) TABS tablet [id number] Yes Take 1 tablet by mouth daily with breakfast. Centrum Silver for Men [provider]  Active Self  nitroGLYCERIN  (NITROSTAT ) 0.4 MG SL tablet [id number] Yes Place 1 tablet (0.4 mg total) under the tongue every 5 (five) minutes x 3 doses as needed for chest pain (If no relief after 3rd dose, call or go to ED.). Lasalle Pointer, NP  Active   pantoprazole  (PROTONIX ) 40 MG tablet [id number] Yes TAKE 1 TABLET BY MOUTH EVERY OTHER DAY Patel, Rutwik K, MD  Active   potassium chloride  (KLOR-CON ) 10 MEQ tablet [id number] Yes Take 1 tablet (10 mEq total) by mouth daily. Lasalle Pointer, NP  Active   RESTASIS 0.05 % ophthalmic emulsion [id number] Yes Place 1 drop into both eyes 2 (two) times daily. [provider]  Active   rosuvastatin  (CRESTOR ) 5 MG tablet [id number] Yes TAKE 1 TABLET (5 MG TOTAL) BY MOUTH DAILY. Gerard Knight, MD  Active   tamsulosin  (FLOMAX ) 0.4 MG CAPS capsule [id number]  Take 0.4 mg by mouth every other day.  Patient not taking: Reported on 06/10/2024   [provider]  Active   Med List Note Thaddeus Filippo, RPH-CPP 12/28/23 1146): Revlimid  (lenalidomide ) filled at [facility]            Recommendation:   Continue Current Plan of Care  Follow Up Plan:   Closing From:  Complex Care Management Patient has met all care management goals. Care Management case will be closed. Patient has been provided contact information should new needs arise.   Grandville Lax, BSN  RN [facility], [facility] RN Care Manager Direct Dial: [phone]  Fax: [phone]

## 2024-06-10 NOTE — Patient Instructions (Signed)
 Visit Information  Thank you for taking time to visit with me today. Please don't hesitate to contact me if I can be of assistance to you before our next scheduled appointment.  Your next care management appointment is no further scheduled appointments.    Closing From: Complex Care Management. Patient has met all care management goals. Care Management case will be closed. Patient has been provided contact information should new needs arise.   Please call the care guide team at [phone] if you need to cancel, schedule, or reschedule an appointment.   Please call the Suicide and Crisis Lifeline: 988 call the [place]  National Suicide Prevention Lifeline: 351-704-6882 or TTY: (951)597-7781 TTY 539-148-5302) to talk to a trained counselor call 1-800-273-TALK (toll free, 24 hour hotline) call the [facility]: [phone] call 911 if you are experiencing a Mental Health or Behavioral Health Crisis or need someone to talk to.  Grandville Lax, BSN RN [facility], [facility] RN Care Manager Direct Dial: [phone]  Fax: [phone]

## 2024-06-16 ENCOUNTER — Ambulatory Visit (HOSPITAL_COMMUNITY)
Admission: RE | Admit: 2024-06-16 | Discharge: 2024-06-16 | Disposition: A | Discharge status: Home / Self Care | Source: Ambulatory Visit | Attending: Hematology | Admitting: Hematology

## 2024-06-16 DIAGNOSIS — C9000 Multiple myeloma not having achieved remission: Secondary | ICD-10-CM | POA: Diagnosis not present

## 2024-06-16 MED ORDER — FLUDEOXYGLUCOSE F - 18 (FDG) INJECTION
9.7700 | Freq: Once | INTRAVENOUS | Status: AC | PRN
  Administered 2024-06-16: 9.77 via INTRAVENOUS

## 2024-06-21 ENCOUNTER — Ambulatory Visit (INDEPENDENT_AMBULATORY_CARE_PROVIDER_SITE_OTHER): Admitting: Urology

## 2024-06-21 VITALS — BP 135/73 | HR 69

## 2024-06-21 DIAGNOSIS — C61 Malignant neoplasm of prostate: Secondary | ICD-10-CM | POA: Diagnosis not present

## 2024-06-21 DIAGNOSIS — R350 Frequency of micturition: Secondary | ICD-10-CM | POA: Diagnosis not present

## 2024-06-21 DIAGNOSIS — Z87898 Personal history of other specified conditions: Secondary | ICD-10-CM

## 2024-06-21 DIAGNOSIS — N401 Enlarged prostate with lower urinary tract symptoms: Secondary | ICD-10-CM | POA: Diagnosis not present

## 2024-06-21 NOTE — Progress Notes (Signed)
 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  He is only currently on androgen deprivation therapy with every 6 month Eligard  as well as denosumab   -BPH, minimal symptoms but he has a huge gland.  Off of medical therapy, emptying well  Plan:  - It is fine to stay off his medical therapy for BPH with him being minimally symptomatic and emptying well  -PSA and testosterone  have not been checked since January-these will be checked today  -I will see him back in 6 months for check  History of Present Illness: Luis Stewart returns for f/u of BPH as well as PCa   Prior history of elevated PSA--s/p TRUS/Bx in [place] in 2005 and 2008. Apparently, all cores were negative but some were not indicative of prostate tissue at all.   Because of a high PCA 3 test in this office, he underwent TRUS/Bx on 6.23.2016. 1/12 cores were positive for adenocarcinoma-10% of the core in the right lateral base revealed Gleason 3+3 equal 6 adenocarcinoma. Prostate volume was 175 cc. PSA was 16.66. PSA density 0.09. He does not have significant lower urinary tract symptomatology.   He consulted with Dr. Cam in [place] to consider robotic prostatectomy. Due to his low volume prostate cancer, and very low PSA density, it was recommended that he continue with active surveillance.   1.30.2017: Surveillance TRUS/Bx , following a prostatic MRI which revealed no suspicious prostatic lesions and correlated the patient's large prostate. At that time, all 12 cores were negative for prostate cancer. Prostatic volume was 190 mL.   8.21.2019: fusion TRUS/Bx. Recent prostate MRI--volume 220 ml. No significant lesions. No evidence transcapsular/perineural,SV/bony or lymphatic disease. Path--1 core (left apex) revealed GS 3+3 pattern in 5% of core.    PSA followup:    10.18.2016--17.16 11.14.2017--15.4 5.25.2018--17.9 4.29.2019--18.4 2.25.2020--15.9 3.30.2021--15.5 3.30.2022--22.4   9.24.2021 MRI prostate. Prostate volume 250 mL. No significant change compared to prior exam. No radiographic evidence of high-grade prostate carcinoma. PI-RADS 2: Low (clinically significant cancer is unlikely to be present)   4.5.2022: PSA 22.4.   10.4.2022:  PSA 34.5 this was drawn while the patient was being treated for an active COVID infection.  He was put on finasteride  after his last visit.  He was unable to tolerate it due to worsening of his lower urinary tract symptoms.   3.23.2023: PSMA PET scan-- 1. Multifocal radiotracer avid prostate cancer skeletal metastasis. Broad lytic lesion within the sacrum with associated radiotracer activity. Multiple discrete lesions in the thoracic lumbar spine and ribs with minimal CT change. 2. Markedly enlarged prostate gland. Focal activity inferior LEFT aspect the gland is suggestive of prostate adenocarcinoma. 3. No nodal metastasis in the abdomen pelvis. No visceral metastasis.    11.7.2023: Here for recheck.  He is on Firmagon  monthly for metastatic prostate cancer (confirmed by PSMA PET scan).  He is receiving regular infusions for multiple myeloma.  He is having hot flashes and low energy level which may be attributable to his androgen deprivation therapy.  PSA prior to initiation of ADT was around 27, most recently 2.1.  5.7.2024: Here for recheck.  Most recent PSA up slightly to 1.33.  Prior to that it was 0.88 just over a month ago.  He donated stem cells for a potential stem cell transplant last week.  He is having no bony pain.  He is on tamsulosin -lower urinary tract symptoms got worse after he was started on ADT last spring.  11.12.2024: PSA 0.57.  He  received a 40-month Eligard  dose on the 10th of last month with Dr. Rogers.  He is on Xgeva  administered for his metastatic prostate cancer as well as his  myeloma.  No current bony pain.  He takes vitamin D  every other day.  Also on tamsulosin  every other day with adequate voiding symptoms-IPSS 8.  Last bone density study was in June 2023.  7.1.2025: Most recent PSA was from January 2 of this year, 0.54.  That is nadir level.  PET scan performed on 26 June.  Results not back yet.  He has had some hot flashes that are not bothersome.  He has no significant lower urinary tract symptoms.  He is now off of both finasteride  and tamsulosin . Past Medical History:  Diagnosis Date   Arthritis    Asbestosis (HCC)    Closed fracture of distal end of fibula with tibia with routine healing 05/28/2020   COVID-19 09/03/2021   Cyst of kidney, acquired    Cystic disease of liver    Enlarged prostate    Headache    History of fracture of leg    Right, childhood   Hx of migraine headaches    Hyperlipidemia    Multiple myeloma (HCC)    Neuropathy    PE (pulmonary thromboembolism) (HCC)    After knee surgery   Prostate cancer (HCC) 2015   Adenocarcinoma by biopsy    Past Surgical History:  Procedure Laterality Date   BIOPSY PROSTATE  2003 and 2005   COLONOSCOPY  11/28/2008   Dr. Rourk:internal hemorrhoids/tortus colon/ascending colon polyps, tubular adenomas   COLONOSCOPY N/A 09/04/2014   Procedure: COLONOSCOPY;  Surgeon: Lamar CHRISTELLA Hollingshead, MD;  [facility];  Service: Endoscopy;  Laterality: N/A;  9:30   COLONOSCOPY N/A 07/17/2017   Procedure: COLONOSCOPY;  Surgeon: Harvey Margo CROME, MD;  [facility];  Service: Endoscopy;  Laterality: N/A;   COLONOSCOPY WITH PROPOFOL  N/A 10/05/2023   Procedure: COLONOSCOPY WITH PROPOFOL ;  Surgeon: Hollingshead Lamar CHRISTELLA, MD;  [facility];  Service: Endoscopy;  Laterality: N/A;  10:00AM, ASA 3   ESOPHAGOGASTRODUODENOSCOPY N/A 07/16/2017   Procedure: ESOPHAGOGASTRODUODENOSCOPY (EGD);  Surgeon: Harvey Margo CROME, MD;  [facility];  Service: Endoscopy;  Laterality: N/A;   GIVENS CAPSULE  STUDY  07/16/2017   Procedure: GIVENS CAPSULE STUDY;  Surgeon: Harvey Margo CROME, MD;  [facility];  Service: Endoscopy;;   JOINT REPLACEMENT N/A    Phreesia 01/12/2021   POLYPECTOMY  07/17/2017   Procedure: POLYPECTOMY;  Surgeon: Harvey Margo CROME, MD;  [facility];  Service: Endoscopy;;  cecal   POLYPECTOMY  10/05/2023   Procedure: POLYPECTOMY;  Surgeon: Hollingshead Lamar CHRISTELLA, MD;  [facility];  Service: Endoscopy;;   PORTACATH PLACEMENT Left 04/18/2022   Procedure: INSERTION PORT-A-CATH;  Surgeon: Mavis Anes, MD;  Location: [facility];  Service: General;  Laterality: Left;   TOTAL KNEE ARTHROPLASTY Right 06/10/2017   TOTAL KNEE ARTHROPLASTY Right 06/10/2017   Procedure: TOTAL KNEE ARTHROPLASTY;  Surgeon: Beverley Toribio FALCON, MD;  Location: [facility];  Service: Orthopedics;  Laterality: Right;    Home Medications:  Allergies as of 06/21/2024       Reactions   Sulfa Antibiotics Other (See Comments)   Unknown reaction. Childhood reaction        Medication List        Accurate as of June 21, 2024  8:13 AM. If you have any questions, ask your nurse or doctor.  acetaminophen  500 MG tablet Commonly known as: TYLENOL  Take 1,000 mg by mouth every 6 (six) hours as needed for mild pain or moderate pain.   acyclovir  400 MG tablet Commonly known as: ZOVIRAX  TAKE 1 TABLET BY MOUTH TWICE A DAY   DULoxetine  60 MG capsule Commonly known as: CYMBALTA  TAKE 1 CAPSULE BY MOUTH EVERY DAY   ferrous sulfate 325 (65 FE) MG EC tablet Take 325 mg by mouth daily.   lenalidomide  15 MG capsule Commonly known as: REVLIMID  Take 1 15 mg capsule 21 days on and 7 days off   multivitamin with minerals Tabs tablet Take 1 tablet by mouth daily with breakfast. Centrum Silver for Men   nitroGLYCERIN  0.4 MG SL tablet Commonly known as: NITROSTAT  Place 1 tablet (0.4 mg total) under the tongue every 5 (five) minutes x 3 doses as needed for chest pain (If no relief after 3rd  dose, call or go to ED.).   pantoprazole  40 MG tablet Commonly known as: PROTONIX  TAKE 1 TABLET BY MOUTH EVERY OTHER DAY   potassium chloride  10 MEQ tablet Commonly known as: KLOR-CON  Take 1 tablet (10 mEq total) by mouth daily.   Restasis 0.05 % ophthalmic emulsion Generic drug: cycloSPORINE Place 1 drop into both eyes 2 (two) times daily.   rosuvastatin  5 MG tablet Commonly known as: CRESTOR  TAKE 1 TABLET (5 MG TOTAL) BY MOUTH DAILY.   tamsulosin  0.4 MG Caps capsule Commonly known as: FLOMAX  Take 0.4 mg by mouth every other day.   Vitamin D  50 MCG (2000 UT) Caps Take 2,000 Units by mouth every other day.        Allergies:  Allergies  Allergen Reactions   Sulfa Antibiotics Other (See Comments)    Unknown reaction. Childhood reaction    Family History  Problem Relation Age of Onset   Diabetes Mother    Hypertension Mother    Obesity Mother    Heart disease Mother    Mental illness Mother    Hypertension Sister    Obesity Sister    Arthritis Sister    Deep vein thrombosis Father    Dementia Father    Heart disease Father        CABG   Colon cancer Neg Hx     Social History:  reports that he has never smoked. He has never used smokeless tobacco. He reports that he does not drink alcohol and does not use drugs.  ROS: A complete review of systems was performed.  All systems are negative except for pertinent findings as noted.  Physical Exam:  Vital signs in last 24 hours: There were no vitals taken for this visit. Constitutional:  Alert and oriented, No acute distress Cardiovascular: Regular rate  Respiratory: Normal respiratory effort Neurologic: Grossly intact, no focal deficits Psychiatric: Normal mood and affect  I have reviewed prior pt notes  I have reviewed notes from referring/previous physicians--Dr. Charmain notes  I have independently reviewed prior imaging-looked at his CT images from 2023.  Bladder empties well.  Prostate volume huge  at that time.  I have reviewed prior PSA as well as pathology results  IPSS reviewed score 11/8, off of medical therapy  PET scan images reviewed-they were not PSMA PET images, however.  Bladder scan volume 71 mL, more than 1 hour after voiding

## 2024-06-22 ENCOUNTER — Ambulatory Visit: Payer: Self-pay | Admitting: Urology

## 2024-06-22 ENCOUNTER — Other Ambulatory Visit: Payer: Self-pay | Admitting: Hematology

## 2024-06-22 DIAGNOSIS — C9000 Multiple myeloma not having achieved remission: Secondary | ICD-10-CM

## 2024-06-22 LAB — TESTOSTERONE: Testosterone: <3 ng/dL — ABNORMAL LOW (ref 264–916)

## 2024-06-22 LAB — PSA: Prostate Specific Ag, Serum: 0.3 ng/mL (ref 0.0–4.0)

## 2024-06-23 ENCOUNTER — Inpatient Hospital Stay: Attending: Hematology | Admitting: Hematology

## 2024-06-23 ENCOUNTER — Encounter (HOSPITAL_COMMUNITY): Payer: Self-pay | Admitting: Hematology

## 2024-06-23 VITALS — BP 132/75 | HR 65 | Temp 98.1°F | Resp 16 | Wt 181.7 lb

## 2024-06-23 DIAGNOSIS — Z800 Family history of malignant neoplasm of digestive organs: Secondary | ICD-10-CM | POA: Diagnosis not present

## 2024-06-23 DIAGNOSIS — Z5112 Encounter for antineoplastic immunotherapy: Secondary | ICD-10-CM | POA: Diagnosis present

## 2024-06-23 DIAGNOSIS — C61 Malignant neoplasm of prostate: Secondary | ICD-10-CM | POA: Diagnosis present

## 2024-06-23 DIAGNOSIS — C9000 Multiple myeloma not having achieved remission: Secondary | ICD-10-CM | POA: Insufficient documentation

## 2024-06-23 DIAGNOSIS — D649 Anemia, unspecified: Secondary | ICD-10-CM | POA: Insufficient documentation

## 2024-06-23 DIAGNOSIS — C7951 Secondary malignant neoplasm of bone: Secondary | ICD-10-CM | POA: Diagnosis not present

## 2024-06-23 DIAGNOSIS — Z79620 Long term (current) use of immunosuppressive biologic: Secondary | ICD-10-CM | POA: Insufficient documentation

## 2024-06-23 DIAGNOSIS — Z452 Encounter for adjustment and management of vascular access device: Secondary | ICD-10-CM | POA: Diagnosis not present

## 2024-06-23 DIAGNOSIS — M8580 Other specified disorders of bone density and structure, unspecified site: Secondary | ICD-10-CM | POA: Insufficient documentation

## 2024-06-23 DIAGNOSIS — Z8042 Family history of malignant neoplasm of prostate: Secondary | ICD-10-CM | POA: Diagnosis not present

## 2024-06-23 MED ORDER — LENALIDOMIDE 15 MG PO CAPS: ORAL_CAPSULE | ORAL | 0 refills | Status: DC

## 2024-06-23 NOTE — Progress Notes (Signed)
 [facility] [address], [place] [address]    Clinic Day:  06/23/24   Referring physician: [name], MD  Patient Care Team: [name], MD as PCP - General (Internal Medicine) [name], MD as PCP - Cardiology (Cardiology) [name], MD as Consulting Physician (Gastroenterology) [name], MD as Medical Oncologist (Medical Oncology) [name], RN as Oncology Nurse Navigator (Medical Oncology)   ASSESSMENT & PLAN:   Assessment: 1. Prostate cancer: - TRUS/Bx on 06/14/2015: 1/12 cores positive for adenocarcinoma-10% of the core in the right lateral base revealed Gleason 3+3=6.  Prostate volume 175 cc.  PSA was 16.6.  PSA density 0.09. - Seen by Dr. Cam in [facility] for robotic prostatectomy.  Due to low-volume prostate cancer and very low PSA density, it was recommended that he continue active surveillance. - 01/21/2016: Surveillance TRUS/biopsy, following a prostatic MRI which revealed no suspicious prostatic lesions and correlated the patient's large prostate.  All 12 cores were negative for prostate cancer.  Prostate volume was 190 mL. - 08/11/2018: Fusion TRUS/biopsy: Recent prostate MRI-volume 220 mL, no significant lesions.  No evidence of transcapsular/.  Ureteral, SV/bony or lymphatic disease.  Path-1 core (left apex) revealed Gleason 3+3 and 5% of core. - PSA: 17.16 (10/09/2015), 15.4 (11/04/2016), 17.9 (05/15/2017), 18.4 (04/19/2018), 15.9 (02/15/2019), 15.5 (03/20/2020), 22.4 (03/20/2021) - MRI of the lumbar spine with and without contrast on 11/27/2021 done for back pain showed expanded appearance of the sacrum with diffusely abnormal bone marrow signal and contrast-enhancement concerning for metastatic disease. - CT pelvis with contrast on 11/27/2021: Expansile, heterogeneous trabeculated appearance of the sacrum with thickening of the cortex, unchanged from multiple prior exams.  No evidence of pelvic  lymphadenopathy.  Extreme prostatomegaly. -PSMA PET scan (03/13/2022): Markedly enlarged prostate gland with focal activity inferior left aspect of the gland suggesting prostatic adenocarcinoma.  No nodal metastasis in the abdomen or pelvis.  Multifocal skeletal metastasis in the mid sacrum, right sacral ala, subtle lesions in the L2 and several rib lesions. - Degarelix  started on 04/15/2022   2. Social/family history: - Lives at home with his wife.  Retired in 2003 from [employer].  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  Non-smoker. - Sister had cholangiocarcinoma.  Paternal first cousin had metastatic cancer.  Paternal uncle had prostate cancer.  3.  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  Core biopsy demonstrates hypercellular marrow (95%) with large atypical plasma cells arranged in sheets, by CD138 IHC plasma cells comprise 70% of the cellular elements, kappa restricted.  Amyloid was negative. - Chromosome analysis: 50, XY, del(20)(q11.2q13.1) [5]/46,XY[17] - Myeloma FISH panel: Tetrasomies1, 4, 11, 16 and 20, deletion of 13 q., gain of 14 q./rearrangement of IgH gene - PET CT scan (04/03/2022): No sign of tracer avid lesions of myeloma.  Chronic progressive changes involving the sacrum, left iliac bone including accentuation of trabecular markings and cortical thickening with low FDG uptake suggestive of chronic Paget's disease.  Marked prostate gland enlargement. - BMBX (03/20/2022):Hypercellular bone marrow with at least 25% plasma cells on aspirate with atypical features.  Core biopsy demonstrates hypercellular marrow for age (95%) with large atypical plasma cells arranged in sheets.  By CD138 IHC, plasma cells comprise approximately 70% of cellular elements and are kappa restricted.  Amyloid deposition was negative by Congo red.  Karyotype showed 20 q. deletion.  Myeloma FISH panel showed complex abnormalities. - Dara KRD started on 04/15/2022, 8 cycles completed. - Stem cell collection was completed at [facility].  Patient decided to do transplant at first relapse.    Plan: 1. 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  He has occasional hot flushes.  Latest PSA has improved to 0.3, down from 0.54.  Testosterone  is at castrate level.  2.  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  He does have on and off diarrhea with Revlimid .  He cannot tolerate higher doses of Revlimid  due to diarrhea.  He also gets itching generalized in the first few days after starting each cycle.  He takes Atarax  for it. - Myeloma labs on 06/02/2024: M spike increased to 0.5 g from 0.1 g in October 2024.  It has been steadily increasing.  FLC ratio is 1.85 with increased kappa light chains of 41. - We reviewed PET scan from 06/16/2024: No tracer avid multiple myeloma seen.  Mild hypermetabolic right abdominal wall interstitial thickening?  From injection. - He does not have any aggressive skeletal lesions.  He is having slow progression.  Hence I have recommended restarting Darzalex  and continue Revlimid  15 mg 3 weeks on and 1 week off.  He is already taking acyclovir  for shingles prophylaxis. - He will continue Darzalex  and RTC in 2 months with repeat myeloma labs 2 weeks prior.   3.  Normocytic anemia: - He is taking iron tablet daily.  Last ferritin was 398 and saturation 14.  Hemoglobin was 10.6.  4.  Osteopenia (DEXA scan 06/04/2022: T score -2.1): - Calcium  is 8.5 with almond 3.3.  Continue denosumab  monthly.    Orders Placed This Encounter  Procedures   CBC with Differential    Standing Status:   Future    Expected Date:   06/30/2024    Expiration Date:   06/30/2025   CBC with Differential    Standing Status:   Future    Expected Date:   07/28/2024     Expiration Date:   07/28/2025      LILLETTE Hummingbird R [name],acting as a scribe for Alean Stands, MD.,have documented all relevant documentation on the behalf of Alean Stands, MD,as directed by  Alean Stands, MD while in the presence of Alean Stands, MD.  I, Alean Stands MD, have reviewed the above documentation for accuracy and completeness, and I agree with the above.       Alean Stands, MD   7/3/20253:59 PM  CHIEF COMPLAINT:   Diagnosis: prostate cancer and multiple myeloma    Cancer Staging  Multiple myeloma (HCC) Staging form: Plasma Cell Myeloma and Plasma Cell Disorders, AJCC 8th Edition - Clinical: No stage assigned - Unsigned    Prior Therapy: Carfilzomib  (20/70) + Daratumumab  SQ + Dexamethasone  (20/40) DaraKRd q28d   Current Therapy:  Eligard ; Revlimid  15 mg 3 weeks on/1 week of    HISTORY OF PRESENT ILLNESS:   Oncology History  Multiple myeloma (HCC)  04/09/2022 Initial Diagnosis   Multiple myeloma (HCC)   04/15/2022 - 11/11/2022 Chemotherapy   Patient is on Treatment Plan : MYELOMA RELAPSED/REFRACTORY Carfilzomib  (20/70) + Daratumumab  SQ + Dexamethasone  (20/40) DaraKd q28d     06/30/2024 -  Chemotherapy   Patient is on Treatment Plan : MYELOMA Daratumumab  SQ q28d        INTERVAL HISTORY:   Devean is a 73 y.o. male  presenting to clinic today for follow up of prostate cancer and multiple myeloma. He was last seen by me on 06/09/24.  Since his last visit, he underwent restaging PET on 06/16/24 that found: No findings of tracer avid myeloma. 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 Mild hypermetabolism corresponding to new right abdominal wall interstitial thickening. A left Port-A-Cath tip at mid to low SVC. Aortic Atherosclerosis. Moderate to marked prostatomegaly.  Today, he states that he is doing well overall. His appetite level is at 75%. His energy level  is at 75%. Ghassan is accompanied by family members.   He was last given a Darzalex  injection at least 1 year ago. He denies any abdominal pain in the umbilical or right lower quadrant, though he does note a palpable knot at his previous injection site. Dillan is taking acyclovir  as prescribed.  He reports diarrhea due to Revlimid , that is intermittently severe, and has gradually worsened. He also notes during his first 4-5 days of a new cycle of Revlimid  he has itching that resolves after taking 1 pill of hydroxyzine . Ajai denies any rash.   PAST MEDICAL HISTORY:   Past Medical History: Past Medical History:  Diagnosis Date   Arthritis    Asbestosis (HCC)    Closed fracture of distal end of fibula with tibia with routine healing 05/28/2020   COVID-19 09/03/2021   Cyst of kidney, acquired    Cystic disease of liver    Enlarged prostate    Headache    History of fracture of leg    Right, childhood   Hx of migraine headaches    Hyperlipidemia    Multiple myeloma (HCC)    Neuropathy    PE (pulmonary thromboembolism) (HCC)    After knee surgery   Prostate cancer (HCC) 2015   Adenocarcinoma by biopsy    Surgical History: Past Surgical History:  Procedure Laterality Date   BIOPSY PROSTATE  2003 and 2005   COLONOSCOPY  11/28/2008   Dr. Rourk:internal hemorrhoids/tortus colon/ascending colon polyps, tubular adenomas   COLONOSCOPY N/A 09/04/2014   Procedure: COLONOSCOPY;  Surgeon: Lamar CHRISTELLA Hollingshead, MD;  [facility];  Service: Endoscopy;  Laterality: N/A;  9:30   COLONOSCOPY N/A 07/17/2017   Procedure: COLONOSCOPY;  Surgeon: Harvey Margo CROME, MD;  [facility]: [facility];  Service: Endoscopy;  Laterality: N/A;   COLONOSCOPY WITH PROPOFOL  N/A 10/05/2023   Procedure: COLONOSCOPY WITH PROPOFOL ;  Surgeon: Hollingshead Lamar CHRISTELLA, MD;  [facility]: [facility];  Service: Endoscopy;  Laterality: N/A;  10:00AM, ASA 3   ESOPHAGOGASTRODUODENOSCOPY N/A 07/16/2017   Procedure:  ESOPHAGOGASTRODUODENOSCOPY (EGD);  Surgeon: Harvey Margo CROME, MD;  [facility]: [facility];  Service: Endoscopy;  Laterality: N/A;   GIVENS CAPSULE STUDY  07/16/2017   Procedure: GIVENS CAPSULE STUDY;  Surgeon: Harvey Margo CROME, MD;  [facility]: [facility];  Service: Endoscopy;;   JOINT REPLACEMENT N/A    Phreesia 01/12/2021   POLYPECTOMY  07/17/2017   Procedure: POLYPECTOMY;  Surgeon: Harvey Margo CROME, MD;  [facility]: [facility];  Service: Endoscopy;;  cecal   POLYPECTOMY  10/05/2023   Procedure: POLYPECTOMY;  Surgeon: Hollingshead Lamar CHRISTELLA, MD;  [facility]: [facility];  Service: Endoscopy;;   PORTACATH PLACEMENT Left 04/18/2022   Procedure: INSERTION PORT-A-CATH;  Surgeon: Mavis Anes, MD;  Location: [facility];  Service: General;  Laterality: Left;   TOTAL KNEE ARTHROPLASTY Right 06/10/2017   TOTAL KNEE ARTHROPLASTY Right 06/10/2017   Procedure: TOTAL  KNEE ARTHROPLASTY;  Surgeon: Beverley Toribio FALCON, MD;  Location: [facility];  Service: Orthopedics;  Laterality: Right;    Social History: Social History   Socioeconomic History   Marital status: Married    Spouse name: Idell caldron   Number of children: Not on file   Years of education: Not on file   Highest education level: Not on file  Occupational History   Occupation: Retired  Tobacco Use   Smoking status: Never   Smokeless tobacco: Never  Vaping Use   Vaping status: Never Used  Substance and Sexual Activity   Alcohol use: No   Drug use: No   Sexual activity: Yes  Other Topics Concern   Not on file  Social History Narrative   Previously worked at [employer]   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   Social Drivers of Corporate investment banker Strain: Low Risk  (04/12/2024)   Overall Financial Resource Strain (CARDIA)    Difficulty of Paying Living Expenses: Not hard at all  Food Insecurity: No Food Insecurity (06/10/2024)   Hunger Vital Sign    Worried About Running Out of Food in the Last Year: Never true     Ran Out of Food in the Last Year: Never true  Transportation Needs: No Transportation Needs (06/10/2024)   PRAPARE - Administrator, Civil Service (Medical): No    Lack of Transportation (Non-Medical): No  Physical Activity: Sufficiently Active (04/12/2024)   Exercise Vital Sign    Days of Exercise per Week: 7 days    Minutes of Exercise per Session: 30 min  Stress: No Stress Concern Present (04/12/2024)   [employer] of Occupational Health - Occupational Stress Questionnaire    Feeling of Stress : Not at all  Social Connections: Socially Integrated (04/12/2024)   Social Connection and Isolation Panel    Frequency of Communication with Friends and Family: More than three times a week    Frequency of Social Gatherings with Friends and Family: More than three times a week    Attends Religious Services: More than 4 times per year    Active Member of [employer] or Organizations: Yes    Attends Engineer, structural: More than 4 times per year    Marital Status: Married  Catering manager Violence: Not At Risk (06/10/2024)   Humiliation, Afraid, Rape, and Kick questionnaire    Fear of Current or Ex-Partner: No    Emotionally Abused: No    Physically Abused: No    Sexually Abused: No    Family History: Family History  Problem Relation Age of Onset   Diabetes Mother    Hypertension Mother    Obesity Mother    Heart disease Mother    Mental illness Mother    Hypertension Sister    Obesity Sister    Arthritis Sister    Deep vein thrombosis Father    Dementia Father    Heart disease Father        CABG   Colon cancer Neg Hx     Current Medications:  Current Outpatient Medications:    acetaminophen  (TYLENOL ) 500 MG tablet, Take 1,000 mg by mouth every 6 (six) hours as needed for mild pain or moderate pain., Disp: , Rfl:    acyclovir  (ZOVIRAX ) 400 MG tablet, TAKE 1 TABLET BY MOUTH TWICE A DAY, Disp: 180 tablet, Rfl: 2   Cholecalciferol (VITAMIN D ) 2000 units  CAPS, Take 2,000 Units by mouth  every other day., Disp: , Rfl:    DULoxetine  (CYMBALTA ) 60 MG capsule, TAKE 1 CAPSULE BY MOUTH EVERY DAY, Disp: 90 capsule, Rfl: 2   ferrous sulfate 325 (65 FE) MG EC tablet, Take 325 mg by mouth daily., Disp: , Rfl:    Multiple Vitamin (MULTIVITAMIN WITH MINERALS) TABS tablet, Take 1 tablet by mouth daily with breakfast. Centrum Silver for Men, Disp: , Rfl:    nitroGLYCERIN  (NITROSTAT ) 0.4 MG SL tablet, Place 1 tablet (0.4 mg total) under the tongue every 5 (five) minutes x 3 doses as needed for chest pain (If no relief after 3rd dose, call or go to ED.)., Disp: 30 tablet, Rfl: 0   pantoprazole  (PROTONIX ) 40 MG tablet, TAKE 1 TABLET BY MOUTH EVERY OTHER DAY, Disp: 45 tablet, Rfl: 1   potassium chloride  (KLOR-CON ) 10 MEQ tablet, Take 1 tablet (10 mEq total) by mouth daily., Disp: 90 tablet, Rfl: 1   RESTASIS 0.05 % ophthalmic emulsion, Place 1 drop into both eyes 2 (two) times daily., Disp: , Rfl:    rosuvastatin  (CRESTOR ) 5 MG tablet, TAKE 1 TABLET (5 MG TOTAL) BY MOUTH DAILY., Disp: 90 tablet, Rfl: 3   lenalidomide  (REVLIMID ) 15 MG capsule, Take 1 15 mg capsule 21 days on and 7 days off, Disp: 21 capsule, Rfl: 0   Allergies: Allergies  Allergen Reactions   Sulfa Antibiotics Other (See Comments)    Unknown reaction. Childhood reaction    REVIEW OF SYSTEMS:   Review of Systems  Constitutional:  Negative for chills, fatigue and fever.  HENT:   Negative for lump/mass, mouth sores, nosebleeds, sore throat and trouble swallowing.   Eyes:  Negative for eye problems.  Respiratory:  Positive for cough. Negative for shortness of breath.   Cardiovascular:  Positive for chest pain. Negative for leg swelling and palpitations.  Gastrointestinal:  Positive for diarrhea. Negative for abdominal pain, constipation, nausea and vomiting.  Genitourinary:  Negative for bladder incontinence, difficulty urinating, dysuria, frequency, hematuria and nocturia.   Musculoskeletal:   Positive for back pain (3/10 severity). Negative for arthralgias, flank pain, myalgias and neck pain.       +bilateral leg pain, 3/10 severity  Skin:  Negative for itching and rash.  Neurological:  Negative for dizziness, headaches and numbness.  Hematological:  Does not bruise/bleed easily.  Psychiatric/Behavioral:  Negative for depression, sleep disturbance and suicidal ideas. The patient is not nervous/anxious.   All other systems reviewed and are negative.    VITALS:   Blood pressure 132/75, pulse 65, temperature 98.1 F (36.7 C), temperature source Oral, resp. rate 16, weight 181 lb 10.5 oz (82.4 kg), SpO2 98%.  Wt Readings from Last 3 Encounters:  06/23/24 181 lb 10.5 oz (82.4 kg)  06/09/24 185 lb (83.9 kg)  03/22/24 190 lb (86.2 kg)    Body mass index is 22.71 kg/m.  Performance status (ECOG): 1 - Symptomatic but completely ambulatory  PHYSICAL EXAM:   Physical Exam Vitals and nursing note reviewed. Exam conducted with a chaperone present.  Constitutional:      Appearance: Normal appearance.  Cardiovascular:     Rate and Rhythm: Normal rate and regular rhythm.     Pulses: Normal pulses.     Heart sounds: Normal heart sounds.  Pulmonary:     Effort: Pulmonary effort is normal.     Breath sounds: Normal breath sounds.  Abdominal:     Palpations: Abdomen is soft. There is no hepatomegaly, splenomegaly or mass.     Tenderness: There is  no abdominal tenderness.  Musculoskeletal:     Right lower leg: No edema.     Left lower leg: No edema.  Lymphadenopathy:     Cervical: No cervical adenopathy.     Right cervical: No superficial, deep or posterior cervical adenopathy.    Left cervical: No superficial, deep or posterior cervical adenopathy.     Upper Body:     Right upper body: No supraclavicular or axillary adenopathy.     Left upper body: No supraclavicular or axillary adenopathy.  Neurological:     General: No focal deficit present.     Mental Status: He is alert  and oriented to person, place, and time.  Psychiatric:        Mood and Affect: Mood normal.        Behavior: Behavior normal.     LABS:      Latest Ref Rng & Units 06/02/2024   11:56 AM 04/14/2024    9:15 AM 03/17/2024    9:19 AM  CBC  WBC 4.0 - 10.5 K/uL 1.8  2.0  1.9   Hemoglobin 13.0 - 17.0 g/dL 89.3  89.7  89.3   Hematocrit 39.0 - 52.0 % 31.9  31.2  31.8   Platelets 150 - 400 K/uL 121  130  111       Latest Ref Rng & Units 06/02/2024   11:56 AM 05/12/2024   10:19 AM 04/14/2024    9:15 AM  CMP  Glucose 70 - 99 mg/dL 89  88  99   BUN 8 - 23 mg/dL 20  17  17    Creatinine 0.61 - 1.24 mg/dL 8.87  8.88  8.93   Sodium 135 - 145 mmol/L 137  139  139   Potassium 3.5 - 5.1 mmol/L 3.8  3.7  3.5   Chloride 98 - 111 mmol/L 104  108  105   CO2 22 - 32 mmol/L 24  27  25    Calcium  8.9 - 10.3 mg/dL 8.5  8.5  8.6   Total Protein 6.5 - 8.1 g/dL 6.7  6.0  5.7   Total Bilirubin 0.0 - 1.2 mg/dL 0.6  0.6  0.6   Alkaline Phos 38 - 126 U/L 32  31  32   AST 15 - 41 U/L 31  22  21    ALT 0 - 44 U/L 27  20  20       No results found for: CEA1, CEA / No results found for: CEA1, CEA Lab Results  Component Value Date   PSA1 0.3 06/21/2024   No results found for: CAN199 No results found for: [id number]  Lab Results  Component Value Date   TOTALPROTELP 6.0 06/02/2024   TOTALPROTELP 6.1 06/02/2024   ALBUMINELP 3.2 06/02/2024   A1GS 0.3 06/02/2024   A2GS 0.9 06/02/2024   BETS 0.9 06/02/2024   GAMS 0.8 06/02/2024   MSPIKE 0.5 (H) 06/02/2024   SPEI Comment 06/02/2024   Lab Results  Component Value Date   TIBC 319 06/02/2024   TIBC 392 09/24/2023   TIBC 441 07/16/2023   FERRITIN 398 (H) 06/02/2024   FERRITIN 51 12/24/2023   FERRITIN 49 09/24/2023   IRONPCTSAT 14 (L) 06/02/2024   IRONPCTSAT 15 (L) 09/24/2023   IRONPCTSAT 11 (L) 07/16/2023   Lab Results  Component Value Date   LDH 142 10/21/2022   LDH 131 08/26/2022   LDH 124 07/29/2022     STUDIES:   NM PET Image  Restage (PS) Whole Body Result Date: 06/22/2024  CLINICAL DATA:  Subsequent treatment strategy for restaging of multiple myeloma, on chemotherapy. EXAM: NUCLEAR MEDICINE PET WHOLE BODY TECHNIQUE: 9.8 mCi F-18 FDG was injected intravenously. Full-ring PET imaging was performed from the head to foot after the radiotracer. CT data was obtained and used for attenuation correction and anatomic localization. Fasting blood glucose: 95 mg/dl COMPARISON:  95/86/7976 FINDINGS: Mediastinal blood pool activity: SUV max 2.0 HEAD/NECK: No areas of abnormal hypermetabolism. Incidental CT findings: No cervical adenopathy. CHEST: No pulmonary parenchymal or thoracic nodal hypermetabolism. Incidental CT findings: Left Port-A-Cath tip at mid to low SVC. Mild cardiomegaly. No thoracic adenopathy. ABDOMEN/PELVIS: No abdominopelvic nodal hypermetabolism. Mild bilateral adrenal hypermetabolism is likely physiologic; no correlate nodule or mass. Hypermetabolism corresponding to lateral right abdominal wall subcutaneous fat stranding including at a S.U.V. max of 5.3 on 234/202. New. Incidental CT findings: Multiple hepatic cysts. Upper pole right renal 1.0 cm low-density lesion is likely a cyst . In the absence of clinically indicated signs/symptoms require(s) no independent follow-up. Abdominal aortic atherosclerosis. Moderate to marked prostatomegaly with median lobe impression into the bladder. SKELETON: The previous hypermetabolism associated with sacral and left iliac cortical thickening/coarsening has resolved. CT appearance is relatively similar. No new marrow hypermetabolism. Incidental CT findings: none EXTREMITIES: No areas of abnormal hypermetabolism. Incidental CT findings: Right knee arthroplasty. No suspicious focal osseous lesion IMPRESSION: 1. No findings of tracer avid myeloma. 2. 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 3. Mild hypermetabolism  corresponding to new right abdominal wall interstitial thickening. Correlate with instrumentation or trauma in this area. 4. A left Port-A-Cath tip at mid to low SVC. 5.  Aortic Atherosclerosis (ICD10-I70.0). 6. Moderate to marked prostatomegaly. Electronically Signed   By: Rockey Kilts M.D.   On: 06/22/2024 08:52

## 2024-06-23 NOTE — Progress Notes (Signed)
 START OFF PATHWAY REGIMEN - Multiple Myeloma and Other Plasma Cell Dyscrasias   [id number]:Daratumumab  SUBQ q28 Days:   Cycles 1 and 2: A cycle is every 28 days:     Daratumumab  and hyaluronidase -fihj    Cycles 3 through 6: A cycle is every 28 days:     Daratumumab  and hyaluronidase -fihj    Cycles 7 and beyond: A cycle is every 28 days:     Daratumumab  and hyaluronidase -fihj   **Always confirm dose/schedule in your pharmacy ordering system**  Patient Characteristics: Multiple Myeloma, Post-Transplant Maintenance Therapy, High Risk Disease Classification: Multiple Myeloma Therapeutic Status: Post-transplant Maintenance Therapy R2-ISS Staging: II Risk Status: High Risk Intent of Therapy: Non-Curative / Palliative Intent, Discussed with Patient

## 2024-06-23 NOTE — Patient Instructions (Addendum)
 [facility] at [facility] Discharge Instructions   You were seen and examined today by Dr. Rogers.  He discussed with you the soft progression of your multiple myeloma. Dr. MARLA recommends adding the Darzalex  injection back to your treatment regimen.    We will restart treatment next week.   Return as scheduled.    Thank you for choosing [facility] at [facility] to provide your oncology and hematology care.  To afford each patient quality time with our provider, please arrive at least 15 minutes before your scheduled appointment time.   If you have a lab appointment with the Cancer Center please come in thru the Main Entrance and check in at the main information desk.  You need to re-schedule your appointment should you arrive 10 or more minutes late.  We strive to give you quality time with our providers, and arriving late affects you and other patients whose appointments are after yours.  Also, if you no show three or more times for appointments you may be dismissed from the clinic at the providers discretion.     Again, thank you for choosing [facility].  Our hope is that these requests will decrease the amount of time that you wait before being seen by our physicians.       _____________________________________________________________  Should you have questions after your visit to [facility], please contact our office at [phone] and follow the prompts.  Our office hours are 8:00 a.m. and 4:30 p.m. Monday - Friday.  Please note that voicemails left after 4:00 p.m. may not be returned until the following business day.  We are closed weekends and major holidays.  You do have access to a nurse 24-7, just call the main number to the clinic [phone] and do not press any options, hold on the line and a nurse will answer the phone.    For prescription refill requests, have your pharmacy contact our office and  allow 72 hours.    Due to Covid, you will need to wear a mask upon entering the hospital. If you do not have a mask, a mask will be given to you at the Main Entrance upon arrival. For doctor visits, patients may have 1 support person age 76 or older with them. For treatment visits, patients can not have anyone with them due to social distancing guidelines and our immunocompromised population.

## 2024-06-23 NOTE — Progress Notes (Signed)
 Pharmacist Chemotherapy Monitoring - Initial Assessment    Anticipated start date: 06/30/24   The following has been reviewed per standard work regarding the patient's treatment regimen: The patient's diagnosis, treatment plan and drug doses, and organ/hematologic function Lab orders and baseline tests specific to treatment regimen  The treatment plan start date, drug sequencing, and pre-medications Prior authorization status  Patient's documented medication list, including drug-drug interaction screen and prescriptions for anti-emetics and supportive care specific to the treatment regimen The drug concentrations, fluid compatibility, administration routes, and timing of the medications to be used The patient's access for treatment and lifetime cumulative dose history, if applicable  The patient's medication allergies and previous infusion related reactions, if applicable   Changes made to treatment plan:  Pharmacy has substituted cetirizine 10 mg orally x 1 as premedication for   Diphenhydramine  discontinued.  Remove days 8, 15 of treatment plan.  Only to get q 4 weeks.  V.O. Dr Theadore Stewart, PharmD     Follow up needed:  Pending authorization for treatment    Luis Stewart, [facility], 06/23/2024  3:40 PM

## 2024-06-23 NOTE — Progress Notes (Signed)
 ON PATHWAY REGIMEN - Multiple Myeloma and Other Plasma Cell Dyscrasias  No Change  Continue With Treatment as Ordered.  Original Decision Date/Time: 04/09/2022 17:59     A cycle is every 28 days:     Carfilzomib       Carfilzomib       Carfilzomib       Dexamethasone       Dexamethasone       Lenalidomide    **Always confirm dose/schedule in your pharmacy ordering system**  Patient Characteristics: Multiple Myeloma, Newly Diagnosed, Transplant Eligible, High Risk Disease Classification: Multiple Myeloma R-ISS Staging: Unknown Therapeutic Status: Newly Diagnosed Is Patient Eligible for Transplant<= Transplant Eligible Risk Status: High Risk Intent of Therapy: Non-Curative / Palliative Intent, Discussed with Patient

## 2024-06-23 NOTE — Progress Notes (Signed)
 DISCONTINUE ON PATHWAY REGIMEN - Multiple Myeloma and Other Plasma Cell Dyscrasias     A cycle is every 28 days:     Carfilzomib       Carfilzomib       Carfilzomib       Dexamethasone       Dexamethasone       Lenalidomide    **Always confirm dose/schedule in your pharmacy ordering system**  PRIOR TREATMENT: [id number]: KRd (Carfilzomib  20/36 mg/m2 + Lenalidomide  25 mg + Dexamethasone  PO 40/20 mg) q28 Days x 4-8 Cycles Concurrent with Referral to Transplant Service    Patient Characteristics: Multiple Myeloma Disease Classification: Multiple Myeloma Therapeutic Status: Newly Diagnosed

## 2024-06-24 ENCOUNTER — Other Ambulatory Visit: Payer: Self-pay

## 2024-06-29 ENCOUNTER — Encounter: Payer: Self-pay | Admitting: Hematology

## 2024-06-30 ENCOUNTER — Inpatient Hospital Stay

## 2024-06-30 DIAGNOSIS — C9000 Multiple myeloma not having achieved remission: Secondary | ICD-10-CM

## 2024-06-30 DIAGNOSIS — C61 Malignant neoplasm of prostate: Secondary | ICD-10-CM

## 2024-06-30 DIAGNOSIS — Z5112 Encounter for antineoplastic immunotherapy: Secondary | ICD-10-CM | POA: Diagnosis not present

## 2024-06-30 LAB — CBC WITH DIFFERENTIAL/PLATELET
Abs Immature Granulocytes: 0.01 K/uL (ref 0.00–0.07)
Basophils Absolute: 0 K/uL (ref 0.0–0.1)
Basophils Relative: 1 %
Eosinophils Absolute: 0.1 K/uL (ref 0.0–0.5)
Eosinophils Relative: 6 %
HCT: 31.9 % — ABNORMAL LOW (ref 39.0–52.0)
Hemoglobin: 10.3 g/dL — ABNORMAL LOW (ref 13.0–17.0)
Immature Granulocytes: 1 %
Lymphocytes Relative: 30 %
Lymphs Abs: 0.6 K/uL — ABNORMAL LOW (ref 0.7–4.0)
MCH: 31.2 pg (ref 26.0–34.0)
MCHC: 32.3 g/dL (ref 30.0–36.0)
MCV: 96.7 fL (ref 80.0–100.0)
Monocytes Absolute: 0.2 K/uL (ref 0.1–1.0)
Monocytes Relative: 12 %
Neutro Abs: 1 K/uL — ABNORMAL LOW (ref 1.7–7.7)
Neutrophils Relative %: 50 %
Platelets: 114 K/uL — ABNORMAL LOW (ref 150–400)
RBC: 3.3 MIL/uL — ABNORMAL LOW (ref 4.22–5.81)
RDW: 16.9 % — ABNORMAL HIGH (ref 11.5–15.5)
WBC: 1.9 K/uL — ABNORMAL LOW (ref 4.0–10.5)
nRBC: 0 % (ref 0.0–0.2)

## 2024-06-30 LAB — COMPREHENSIVE METABOLIC PANEL WITH GFR
ALT: 20 U/L (ref 0–44)
AST: 29 U/L (ref 15–41)
Albumin: 3.3 g/dL — ABNORMAL LOW (ref 3.5–5.0)
Alkaline Phosphatase: 30 U/L — ABNORMAL LOW (ref 38–126)
Anion gap: 6 (ref 5–15)
BUN: 16 mg/dL (ref 8–23)
CO2: 28 mmol/L (ref 22–32)
Calcium: 8.9 mg/dL (ref 8.9–10.3)
Chloride: 107 mmol/L (ref 98–111)
Creatinine, Ser: 1.2 mg/dL (ref 0.61–1.24)
GFR, Estimated: >60 mL/min (ref >60)
Glucose, Bld: 104 mg/dL — ABNORMAL HIGH (ref 70–99)
Potassium: 4.1 mmol/L (ref 3.5–5.1)
Sodium: 141 mmol/L (ref 135–145)
Total Bilirubin: 0.5 mg/dL (ref 0.0–1.2)
Total Protein: 6.2 g/dL — ABNORMAL LOW (ref 6.5–8.1)

## 2024-06-30 LAB — PSA: Prostatic Specific Antigen: 0.26 ng/mL (ref 0.00–4.00)

## 2024-06-30 LAB — MAGNESIUM: Magnesium: 1.8 mg/dL (ref 1.7–2.4)

## 2024-06-30 MED ORDER — MONTELUKAST SODIUM 10 MG PO TABS
10.0000 mg | ORAL_TABLET | Freq: Once | ORAL | Status: AC
  Administered 2024-06-30: 10 mg via ORAL
  Filled 2024-06-30: qty 1

## 2024-06-30 MED ORDER — DARATUMUMAB-HYALURONIDASE-FIHJ 1800-30000 MG-UT/15ML ~~LOC~~ SOLN
1800.0000 mg | Freq: Once | SUBCUTANEOUS | Status: AC
  Administered 2024-06-30: 1800 mg via SUBCUTANEOUS
  Filled 2024-06-30: qty 15

## 2024-06-30 MED ORDER — DEXAMETHASONE 4 MG PO TABS
20.0000 mg | ORAL_TABLET | Freq: Once | ORAL | Status: AC
  Administered 2024-06-30: 20 mg via ORAL
  Filled 2024-06-30: qty 5

## 2024-06-30 MED ORDER — CETIRIZINE HCL 10 MG PO TABS
10.0000 mg | ORAL_TABLET | Freq: Once | ORAL | Status: AC
  Administered 2024-06-30: 10 mg via ORAL
  Filled 2024-06-30: qty 1

## 2024-06-30 MED ORDER — HEPARIN SOD (PORK) LOCK FLUSH 100 UNIT/ML IV SOLN
500.0000 [IU] | Freq: Once | INTRAVENOUS | Status: AC
  Administered 2024-06-30: 500 [IU] via INTRAVENOUS

## 2024-06-30 MED ORDER — SODIUM CHLORIDE 0.9% FLUSH
10.0000 mL | Freq: Once | INTRAVENOUS | Status: AC
  Administered 2024-06-30: 10 mL via INTRAVENOUS

## 2024-06-30 MED ORDER — ACETAMINOPHEN 325 MG PO TABS
650.0000 mg | ORAL_TABLET | Freq: Once | ORAL | Status: AC
  Administered 2024-06-30: 650 mg via ORAL
  Filled 2024-06-30: qty 2

## 2024-06-30 MED ORDER — ALTEPLASE 2 MG IJ SOLR
2.0000 mg | Freq: Once | INTRAMUSCULAR | Status: AC
  Administered 2024-06-30: 2 mg
  Filled 2024-06-30: qty 2

## 2024-06-30 NOTE — Progress Notes (Signed)
 No type and cross needed or Phenotype.  He has had Darzalex  Faspro in 2023.  No observation period required post injection.  OK to treat with ANC 1000  V.O. Dr Theadore Molt, PharmD

## 2024-06-30 NOTE — Progress Notes (Addendum)
 Patient presents today for Darzalex  Faspro infusion.  Patient is in satisfactory condition with no new complaints voiced.  Vital signs are stable.  Labs reviewed and other all labs are within treatment parameters.Patient's ANC noted to be 1.0. Dr.Katragadda made aware.  We will proceed with treatment per MD orders. Xgeva  will be given with next Darazalex Faspro on 07/28/2024, instead of patient coming on 07/07/24 for Xgeva  only per Dr.Katragadda. Patient made aware and verbalized understanding.  Treatment given today per MD orders. Tolerated infusion without adverse affects. Vital signs stable. No complaints at this time. Discharged from clinic ambulatory in stable condition. Alert and oriented x 3. F/U with [facility] as scheduled.

## 2024-06-30 NOTE — Progress Notes (Signed)
   06/30/24 1500  Spiritual Encounters  Type of Visit Initial  Care provided to: Pt and family  Referral source Other (comment) ([place] [facility])  Reason for visit  (Spiritual Care Education)  OnCall Visit No  Spiritual Framework  Presenting Themes Caregiving needs;Significant life change;Impactful experiences and emotions;Courage hope and growth  Community/Connection Family  Patient Stress Factors Health changes  Interventions  Spiritual Care Interventions Made Established relationship of care and support;Compassionate presence;Narrative/life review;Explored values/beliefs/practices/strengths;Prayer  Intervention Outcomes  Outcomes Awareness around self/spiritual resourses;Reduced anxiety;Connected to spiritual community  Spiritual Care Plan  Spiritual Care Issues Still Outstanding Chaplain will continue to follow   Reason for Visit: Chaplain making rounds on the floor visiting infusion Pts   Description of Visit: Arriving in the room I found Luis Stewart in a recliner chair receiving treatment and his wife [name]) was with him as his support person.  I introduced myself as the new chaplain for the cancer center and offered a brief education on the role of a chaplain and the support we can offer to our patients, caregivers, and staff.  I began a conversation with them, and he was receptive to talking with me.   As it turned out, I had previously met [name] in the hallway earlier in the week and spoken with them briefly, so we had some connection already.  We had spoken regarding the local church they attend [employer]) and the pastor there, and I asked them to give one of the members there [name]) my greeting.   I facilitated life-review and storytelling, and Vicki shared that he and his wife were married for 51 years.  We celebrated that milestone and explored the support that he received from his wife and [name] expressed his gratitude to her and his higher power.   Through the  practice of theological reflection,  [name] expressed his sincere trust in the [place] for his future and his care.  His faith appears genuine, and it is impactful to him.   [name] possesses great humility and mental strength; he is well connected to his spiritual community and spiritual leader and professes strong Christian faith.  He possesses the resources he needs and demonstrates good use of coping skills as well.   Plan of Care: Though I believe that [name] and his wife are well supported and have the resources they need to thrive, I think they also value a connection to spiritual care, thus I will continue to make breif contacts with them at future appointments.   [name] Roll, MDiv   Chaplain, [facility]  [email or web address] [phone]

## 2024-06-30 NOTE — Patient Instructions (Signed)
 [facility] [place] - A DEPT OF [facility]  Discharge Instructions: Thank you for choosing [facility] to provide your oncology and hematology care.  If you have a lab appointment with the Cancer Center - please note that after April 8th, 2024, all labs will be drawn in the cancer center.  You do not have to check in or register with the main entrance as you have in the past but will complete your check-in in the cancer center.  Wear comfortable clothing and clothing appropriate for easy access to any Portacath or PICC line.   We strive to give you quality time with your provider. You may need to reschedule your appointment if you arrive late (15 or more minutes).  Arriving late affects you and other patients whose appointments are after yours.  Also, if you miss three or more appointments without notifying the office, you may be dismissed from the clinic at the provider's discretion.      For prescription refill requests, have your pharmacy contact our office and allow 72 hours for refills to be completed.    Today you received the following chemotherapy and/or immunotherapy agents Darazalex Faspro   To help prevent nausea and vomiting after your treatment, we encourage you to take your nausea medication as directed.  Daratumumab ; Hyaluronidase  Injection What is this medication? DARATUMUMAB ; HYALURONIDASE  (dar a toom ue mab; hye al ur ON i dase) treats multiple myeloma, a type of bone marrow cancer. Daratumumab  works by blocking a protein that causes cancer cells to grow and multiply. This helps to slow or stop the spread of cancer cells. Hyaluronidase  works by increasing the absorption of other medications in the body to help them work better. This medication may also be used treat amyloidosis, a condition that causes the buildup of a protein (amyloid) in your body. It works by reducing the buildup of this protein, which decreases symptoms. It is a  combination medication that contains a monoclonal antibody. This medicine may be used for other purposes; ask your health care provider or pharmacist if you have questions. COMMON BRAND NAME(S): DARZALEX  FASPRO What should I tell my care team before I take this medication? They need to know if you have any of these conditions: Heart disease Infection, such as chickenpox, cold sores, herpes, hepatitis B Lung or breathing disease An unusual or allergic reaction to daratumumab , hyaluronidase , other medications, foods, dyes, or preservatives Pregnant or trying to get pregnant Breast-feeding How should I use this medication? This medication is injected under the skin. It is given by your care team in a hospital or clinic setting. Talk to your care team about the use of this medication in children. Special care may be needed. Overdosage: If you think you have taken too much of this medicine contact a poison control center or emergency room at once. NOTE: This medicine is only for you. Do not share this medicine with others. What if I miss a dose? Keep appointments for follow-up doses. It is important not to miss your dose. Call your care team if you are unable to keep an appointment. What may interact with this medication? Interactions have not been studied. This list may not describe all possible interactions. Give your health care provider a list of all the medicines, herbs, non-prescription drugs, or dietary supplements you use. Also tell them if you smoke, drink alcohol, or use illegal drugs. Some items may interact with your medicine. What should I watch for  while using this medication? Your condition will be monitored carefully while you are receiving this medication. This medication can cause serious allergic reactions. To reduce your risk, your care team may give you other medication to take before receiving this one. Be sure to follow the directions from your care team. This medication can  affect the results of blood tests to match your blood type. These changes can last for up to 6 months after the final dose. Your care team will do blood tests to match your blood type before you start treatment. Tell all of your care team that you are being treated with this medication before receiving a blood transfusion. This medication can affect the results of some tests used to determine treatment response; extra tests may be needed to evaluate response. Talk to your care team if you wish to become pregnant or think you are pregnant. This medication can cause serious birth defects if taken during pregnancy and for 3 months after the last dose. A reliable form of contraception is recommended while taking this medication and for 3 months after the last dose. Talk to your care team about effective forms of contraception. Do not breast-feed while taking this medication. What side effects may I notice from receiving this medication? Side effects that you should report to your care team as soon as possible: Allergic reactions--skin rash, itching, hives, swelling of the face, lips, tongue, or throat Heart rhythm changes--fast or irregular heartbeat, dizziness, feeling faint or lightheaded, chest pain, trouble breathing Infection--fever, chills, cough, sore throat, wounds that don't heal, pain or trouble when passing urine, general feeling of discomfort or being unwell Infusion reactions--chest pain, shortness of breath or trouble breathing, feeling faint or lightheaded Sudden eye pain or change in vision such as blurry vision, seeing halos around lights, vision loss Unusual bruising or bleeding Side effects that usually do not require medical attention (report to your care team if they continue or are bothersome): Constipation Diarrhea Fatigue Nausea Pain, tingling, or numbness in the hands or feet Swelling of the ankles, hands, or feet This list may not describe all possible side effects. Call your  doctor for medical advice about side effects. You may report side effects to FDA at 1-800-FDA-1088. Where should I keep my medication? This medication is given in a hospital or clinic. It will not be stored at home. NOTE: This sheet is a summary. It may not cover all possible information. If you have questions about this medicine, talk to your doctor, pharmacist, or health care provider.  2024 Elsevier/Gold Standard (2022-04-15 00:00:00)    BELOW ARE SYMPTOMS THAT SHOULD BE REPORTED IMMEDIATELY: *FEVER GREATER THAN 100.4 F (38 C) OR HIGHER *CHILLS OR SWEATING *NAUSEA AND VOMITING THAT IS NOT CONTROLLED WITH YOUR NAUSEA MEDICATION *UNUSUAL SHORTNESS OF BREATH *UNUSUAL BRUISING OR BLEEDING *URINARY PROBLEMS (pain or burning when urinating, or frequent urination) *BOWEL PROBLEMS (unusual diarrhea, constipation, pain near the anus) TENDERNESS IN MOUTH AND THROAT WITH OR WITHOUT PRESENCE OF ULCERS (sore throat, sores in mouth, or a toothache) UNUSUAL RASH, SWELLING OR PAIN  UNUSUAL VAGINAL DISCHARGE OR ITCHING   Items with * indicate a potential emergency and should be followed up as soon as possible or go to the Emergency Department if any problems should occur.  Please show the CHEMOTHERAPY ALERT CARD or IMMUNOTHERAPY ALERT CARD at check-in to the Emergency Department and triage nurse.  Should you have questions after your visit or need to cancel or reschedule your appointment, please contact [facility] [place] - A DEPT OF [facility] [phone]  and follow the prompts.  Office hours are 8:00 a.m. to 4:30 p.m. Monday - Friday. Please note that voicemails left after 4:00 p.m. may not be returned until the following business day.  We are closed weekends and major holidays. You have access to a nurse at all times for urgent questions. Please call the main number to the clinic [phone] and follow the prompts.  For any non-urgent questions, you may also contact  your provider using MyChart. We now offer e-Visits for anyone 80 and older to request care online for non-urgent symptoms. For details visit [email or web address].   Also download the MyChart app! Go to the app store, search MyChart, open the app, select [place], and log in with your MyChart username and password.

## 2024-07-06 ENCOUNTER — Other Ambulatory Visit: Payer: Self-pay | Admitting: Hematology

## 2024-07-07 ENCOUNTER — Inpatient Hospital Stay

## 2024-07-07 ENCOUNTER — Inpatient Hospital Stay: Admitting: Hematology

## 2024-07-14 ENCOUNTER — Other Ambulatory Visit: Payer: Self-pay | Admitting: Hematology

## 2024-07-14 DIAGNOSIS — C9000 Multiple myeloma not having achieved remission: Secondary | ICD-10-CM

## 2024-07-14 NOTE — Telephone Encounter (Signed)
 Chart reviewed. Revlimid refilled per last office note with Dr. Ellin Saba.

## 2024-07-23 ENCOUNTER — Other Ambulatory Visit: Payer: Self-pay | Admitting: Internal Medicine

## 2024-07-25 DIAGNOSIS — C9000 Multiple myeloma not having achieved remission: Secondary | ICD-10-CM | POA: Diagnosis not present

## 2024-07-27 NOTE — Progress Notes (Signed)
 MYELOMA AND PLASMA CELL DISORDER CLINIC [facility]  Name: JAISHON KRISHER MRN:  [id number] Date:  07/27/2024  PCP: Self, A Referral  Referring physician: Self, A Referral No address on file  Diagnosis: MM, IgG Kappa Date of Diagnosis: 03/10/22 Stage: R-ISS stage unavailable (LDH 160, alb 3.5, B2M not available) Pathology: Plasma cell myeloma in a hypercellular bone marrow (90%) with trilineage hematopoiesis and approximately 70% kappa-restricted plasma cells.  Karyotype: 74, XY, del(20)(q11.2q13.1) [5]/46,XY[17] Myeloma FISH panel: Tetrasomies1, 4, 11, 16 and 20, deletion of 13 q., gain of 14 q./rearrangement of IgH gene Previous Treatment:  Dara-KRD C1: 04/15/22 (rev initially omitted due to renal function) Completed 8 cycles    Collected 7.91x10(6) CD34+ cells/kg  on 01/15/22  Transitioned to revlimid  maintenance   Current Treatment: Drd (dara added due to increase in M spike) Begun around 06/30/24  HISTORY OF PRESENT ILLNESS: 73 yo M with a history of recently diagnosed metastatic prostate cancer presents for evaluation of multiple myeloma.  He had previously been followed for elevated PSA and localized prostate cancer, had increasing PSA over time.  Developed worsening back pain, had MRI showing concern for possible metastatic lesions.  Work up was also significant for elevated protein.  Found to have M protein of 4.3.  Bone marrow performed showing 70% restricted plasma cells.  PET was performed which showed no obvious myeloma lesion but PSMA showing multiple avid skeletal lesions consistent with prostate mets.  He still has some back pain but overall somewhat better  Interim History: Patient returns to clinic for evaluation of his myeloma and re-establishment of care.  He is overall feeling fairly well today.  Denies any new pains.  Remains active around house.  REVIEW OF SYSTEMS: A full 12 system ROS was obtained and was negative unless  specified above.  ECOG: ECOG/Zubrod Performance Status: 0 - Fully active; no performance restrictions KPS (100-90%)  PAST MEDICAL HISTORY:  Active Ambulatory Problems    Diagnosis Date Noted  . GERD (gastroesophageal reflux disease) 10/15/2017  . Prostate cancer (HCC) 08/17/2015  . Pulmonary nodule 07/19/2017  . Multiple myeloma (HCC) 04/09/2022  . Vitamin D  deficiency 06/05/2014  . Pre-transplant evaluation for stem cell transplant 11/14/2022  . Autologous donor of stem cells 11/25/2022   Resolved Ambulatory Problems    Diagnosis Date Noted  . No Resolved Ambulatory Problems   Past Medical History:  Diagnosis Date  . Arthritis   . Cancer    (CMD) 02/26/2022  . Neuromuscular disorder    (CMD)     PAST SURGICAL HISTORY:  Surgical History[1]  MEDICATIONS:  Current Outpatient Medications  Medication Instructions  . acetaminophen  (TYLENOL ) 1,000 mg  . acyclovir  (ZOVIRAX ) 400 mg  . cholecalciferol (VITAMIN D3) 1,000 Units, Daily  . DULoxetine  (CYMBALTA ) 60 mg, Daily  . ferrous sulfate 325 mg, Daily  . lenalidomide  (REVLIMID ) 15 mg  . multivitamin (Flintstones/Extra C) chewable tablet Take  by mouth.  . nitroglycerin  (NITROSTAT ) 0.4 mg  . pantoprazole  (PROTONIX ) 40 mg  . potassium chloride  (KLOR-CON ) 10 mEq ER tablet 10 mEq, Daily  . prochlorperazine  (COMPAZINE ) 10 mg  . Restasis 0.05 % ophthalmic emulsion 1 drop, 2 times daily  . rosuvastatin  (CRESTOR ) 5 mg, Daily  . tamsulosin  (FLOMAX ) 0.4 mg    ALLERGIES:  Sulfa (sulfonamide antibiotics)  SOCIAL HISTORY:  Social History[2]  FAMILY HISTORY: Family History[3]  PHYSICAL EXAMINATION: BP 135/73 (BP Location: Left arm, Patient Position: Sitting)   Pulse 58   Temp 97 F (36.1 C) (  Temporal)   Resp 16   Ht 1.88 m (6' 2.02)   Wt 84.9 kg (187 lb 1.6 oz)   SpO2 100%   BMI 24.01 kg/m   GENERAL: 73 y.o. year old male, alert and oriented x 3, in no acute distress. HEENT: Anicteric sclerae, EOMI. Nose normal with  patent nares. Oropharynx clear and MMM without lesions. NECK: Supple without thyromegaly. No masses. LUNGS: Normal chest expansion. Clear to auscultation without wheezes, crackles or rhonchi.  CARDIAC: RRR. No murmurs, clicks, or rubs. ABDOMEN: Soft, NT/ND. No palpable HSM. No masses. EXTREMITIES: No clubbing, cyanosis, or edema. Musculoskeletal: No weakness. Range of motion intact. Spinous processes non-tender to percussion along the cervical, thoracic, and lumbar spine. NEUROLOGIC: Alert and oriented. Cranial nerves III-XII grossly symmetric and intact.  SKIN: No petechiae or rash noted.  No bleeding or drainage.  LABORATORY DATA: No visits with results within 2 Week(s) from this visit.  Latest known visit with results is:  Conversion Encounter on 01/13/2023  Component Date Value Ref Range Status  . HX CD34 01/13/2023 0.45  % Final  . HX %VIABILITY 01/13/2023 99.9  % Final  . HX ABO GROUP 01/13/2023 A   Final  . HX RH TYPING 01/13/2023 POS   Final  . HX CD34 01/13/2023 0.09  % Final  . HX %VIABILITY 01/13/2023 99.9  % Final  . HX CD34 01/13/2023 0.48  % Final  . HX %VIABILITY 01/13/2023 99.9  % Final     RADIOGRAPHIC DATA: PET 04/13/22 1. No signs of tracer avid lesions of multiple myeloma.  2. Chronic progressive changes involving the sacrum and left iliac  bone including accentuation of trabecular markings and cortical  thickening is again noted with low level FDG uptake (SUV max 4.04.).  Imaging findings suggest chronic Paget's disease.  3. Marked prostate gland enlargement.  4.  Aortic Atherosclerosis   03/11/22 1. Multifocal radiotracer avid prostate cancer skeletal metastasis.  Broad lytic lesion within the sacrum with associated radiotracer  activity. Multiple discrete lesions in the thoracic lumbar spine and  ribs with minimal CT change.  2. Markedly enlarged prostate gland. Focal activity inferior LEFT  aspect the gland is suggestive of prostate adenocarcinoma.   3. No nodal metastasis in the abdomen pelvis. No visceral  metastasis.   PET 11/14/22 No sites of abnormal metabolic activity to suggest active myelomatous disease.   Somewhat diffuse lucency associated with the sacrum possibly related to Paget's disease. There was a focus of increased activity within the sacrum on PSMA PET suggestive of metastatic disease. Overall, on the current study metabolic activity is at the level of background.  PATHOLOGY DATA:  BMBx 03/10/22 Plasma cell myeloma in a hypercellular bone marrow (90%) with trilineage hematopoiesis and approximately 70% kappa-restricted plasma cells.  The bone marrow is hypercellular (90%) and shows adequate myelopoiesis and erythropoiesis. Megakaryocytes are present in normal numbers with normal morphology.  CD138 immunohistochemistry highlights plasma cells comprising 70% of marrow cellularity and are kappa-restricted by kappa/lambda in situ hybridization. The plasma cells are CD56 negative. A Crystal violet stain is negative. Well circumscribed lymphoid aggregates are seen. Karyotype: 32, XY, del(20)(q11.2q13.1) [5]/46,XY[17] Myeloma FISH panel: Tetrasomies1, 4, 11, 16 and 20, deletion of 13 q., gain of 14 q./rearrangement of IgH gene  PET 11/14/22 Mildly hypercellular bone marrow (50%) with increased erythropoiesis and no increase in plasma cells (<1%).  MRD negative on flow but no plasma cell events reported.  ASSESSMENT AND PLAN: 73 yo M presents for evaluation of multiple myeloma  Multiple myeloma: IgG Kappa, found on evaluation of elevated protein with recently diagnosed metastatic prostate cancer.  ECOG 0-1.  R-ISS staging not available (B2M not available) although appears standard risk from FISH.  Durie-salmon stage II.  Karyotype with del20q as well but no mention of dysplasia on BMBx.  Collected 7.91x10(6) in  Underwent 8 cycles of induction of D-Krd and transitioned to revlimid  maintenance.  Recently with evidence of early  biochemical relapse on rev monotherapy.  Dara was recently added back.   We discussed next options for treatment including proceeding transplant in CR2, CART or just continuing with Drd. He is interested in hearing more about CART given option of being off therapy after completion.  Discussed overall logistics of CART and side effects including CRS and ICANS.  Will have coordinator reach out to patient to provide more information and see if would like to proceed.  Will schedule follow up pending further discussions.  Prostate cancer: metastatic based on PSMA study, currently on ADT.  Tolerating well to date.  Will continue to monitor PSA response as he potentially progresses to transplant.   PSA improved just with ADT.  RTC pending discussion with coordinator           [1] Past Surgical History: Procedure Laterality Date  . COLONOSCOPY  10/05/2023  . [facility]  03/2022  . UPPER GASTROINTESTINAL ENDOSCOPY  2018   2018  [2] Social History Tobacco Use  . Smoking status: Never  . Smokeless tobacco: Never  Substance Use Topics  . Alcohol use: Not Currently  . Drug use: Never  [3] No family history on file.

## 2024-07-28 ENCOUNTER — Inpatient Hospital Stay: Attending: Hematology

## 2024-07-28 ENCOUNTER — Ambulatory Visit

## 2024-07-28 ENCOUNTER — Inpatient Hospital Stay

## 2024-07-28 DIAGNOSIS — Z5112 Encounter for antineoplastic immunotherapy: Secondary | ICD-10-CM | POA: Diagnosis present

## 2024-07-28 DIAGNOSIS — C9000 Multiple myeloma not having achieved remission: Secondary | ICD-10-CM

## 2024-07-28 DIAGNOSIS — C61 Malignant neoplasm of prostate: Secondary | ICD-10-CM | POA: Insufficient documentation

## 2024-07-28 DIAGNOSIS — Z5111 Encounter for antineoplastic chemotherapy: Secondary | ICD-10-CM | POA: Insufficient documentation

## 2024-07-28 LAB — COMPREHENSIVE METABOLIC PANEL WITH GFR
ALT: 26 U/L (ref 0–44)
AST: 23 U/L (ref 15–41)
Albumin: 3.1 g/dL — ABNORMAL LOW (ref 3.5–5.0)
Alkaline Phosphatase: 36 U/L — ABNORMAL LOW (ref 38–126)
Anion gap: 9 (ref 5–15)
BUN: 15 mg/dL (ref 8–23)
CO2: 23 mmol/L (ref 22–32)
Calcium: 8 mg/dL — ABNORMAL LOW (ref 8.9–10.3)
Chloride: 108 mmol/L (ref 98–111)
Creatinine, Ser: 1.08 mg/dL (ref 0.61–1.24)
GFR, Estimated: >60 mL/min (ref >60)
Glucose, Bld: 93 mg/dL (ref 70–99)
Potassium: 3.7 mmol/L (ref 3.5–5.1)
Sodium: 140 mmol/L (ref 135–145)
Total Bilirubin: 0.6 mg/dL (ref 0.0–1.2)
Total Protein: 5.8 g/dL — ABNORMAL LOW (ref 6.5–8.1)

## 2024-07-28 LAB — CBC WITH DIFFERENTIAL/PLATELET
Basophils Absolute: 0 K/uL (ref 0.0–0.1)
Basophils Relative: 0 %
Eosinophils Absolute: 0.2 K/uL (ref 0.0–0.5)
Eosinophils Relative: 11 %
HCT: 29.7 % — ABNORMAL LOW (ref 39.0–52.0)
Hemoglobin: 10 g/dL — ABNORMAL LOW (ref 13.0–17.0)
Lymphocytes Relative: 27 %
Lymphs Abs: 0.4 K/uL — ABNORMAL LOW (ref 0.7–4.0)
MCH: 33.1 pg (ref 26.0–34.0)
MCHC: 33.7 g/dL (ref 30.0–36.0)
MCV: 98.3 fL (ref 80.0–100.0)
Monocytes Absolute: 0.1 K/uL (ref 0.1–1.0)
Monocytes Relative: 6 %
Neutro Abs: 0.8 K/uL — ABNORMAL LOW (ref 1.7–7.7)
Neutrophils Relative %: 56 %
Platelets: 92 K/uL — ABNORMAL LOW (ref 150–400)
RBC: 3.02 MIL/uL — ABNORMAL LOW (ref 4.22–5.81)
RDW: 16 % — ABNORMAL HIGH (ref 11.5–15.5)
WBC: 1.4 K/uL — CL (ref 4.0–10.5)
nRBC: 0 % (ref 0.0–0.2)

## 2024-07-28 LAB — MAGNESIUM: Magnesium: 1.9 mg/dL (ref 1.7–2.4)

## 2024-07-28 MED ORDER — FILGRASTIM-SNDZ 480 MCG/0.8ML IJ SOSY
480.0000 ug | PREFILLED_SYRINGE | Freq: Once | INTRAMUSCULAR | Status: AC
  Administered 2024-07-28: 480 ug via SUBCUTANEOUS
  Filled 2024-07-28: qty 0.8

## 2024-07-28 MED ORDER — ACETAMINOPHEN 325 MG PO TABS
650.0000 mg | ORAL_TABLET | Freq: Once | ORAL | Status: AC
  Administered 2024-07-28: 650 mg via ORAL
  Filled 2024-07-28: qty 2

## 2024-07-28 MED ORDER — DENOSUMAB 120 MG/1.7ML ~~LOC~~ SOLN
120.0000 mg | Freq: Once | SUBCUTANEOUS | Status: AC
Start: 2024-07-28 — End: 2024-07-28
  Administered 2024-07-28: 120 mg via SUBCUTANEOUS
  Filled 2024-07-28: qty 1.7

## 2024-07-28 MED ORDER — DARATUMUMAB-HYALURONIDASE-FIHJ 1800-30000 MG-UT/15ML ~~LOC~~ SOLN
1800.0000 mg | Freq: Once | SUBCUTANEOUS | Status: AC
  Administered 2024-07-28: 1800 mg via SUBCUTANEOUS
  Filled 2024-07-28: qty 15

## 2024-07-28 MED ORDER — DEXAMETHASONE 4 MG PO TABS
20.0000 mg | ORAL_TABLET | Freq: Once | ORAL | Status: AC
  Administered 2024-07-28: 20 mg via ORAL
  Filled 2024-07-28: qty 5

## 2024-07-28 MED ORDER — DIPHENHYDRAMINE HCL 25 MG PO CAPS
50.0000 mg | ORAL_CAPSULE | Freq: Once | ORAL | Status: AC
Start: 2024-07-28 — End: 2024-07-28
  Administered 2024-07-28: 50 mg via ORAL
  Filled 2024-07-28: qty 2

## 2024-07-28 NOTE — Progress Notes (Addendum)
 CRITICAL VALUE ALERT Critical value received:  WBC= 1.4 Date of notification:  07/28/2024 Time of notification: 0940 Critical value read back:  Yes.   Nurse who received alert:  Dena Daring, RN MD notified time and response:  Alean Stands, MD notified at [phone]. Give GCSF 480 mcg x one dose today per Dr. Stands.

## 2024-07-28 NOTE — Patient Instructions (Signed)
 [facility] [place] - A DEPT OF [facility]  Discharge Instructions: Thank you for choosing [facility] to provide your oncology and hematology care.  If you have a lab appointment with the Cancer Center - please note that after April 8th, 2024, all labs will be drawn in the cancer center.  You do not have to check in or register with the main entrance as you have in the past but will complete your check-in in the cancer center.  Wear comfortable clothing and clothing appropriate for easy access to any Portacath or PICC line.   We strive to give you quality time with your provider. You may need to reschedule your appointment if you arrive late (15 or more minutes).  Arriving late affects you and other patients whose appointments are after yours.  Also, if you miss three or more appointments without notifying the office, you may be dismissed from the clinic at the provider's discretion.      For prescription refill requests, have your pharmacy contact our office and allow 72 hours for refills to be completed.    Today you received the following chemotherapy and/or immunotherapy agents Darzalex , Zarxio , and Xgeva .       To help prevent nausea and vomiting after your treatment, we encourage you to take your nausea medication as directed.  BELOW ARE SYMPTOMS THAT SHOULD BE REPORTED IMMEDIATELY: *FEVER GREATER THAN 100.4 F (38 C) OR HIGHER *CHILLS OR SWEATING *NAUSEA AND VOMITING THAT IS NOT CONTROLLED WITH YOUR NAUSEA MEDICATION *UNUSUAL SHORTNESS OF BREATH *UNUSUAL BRUISING OR BLEEDING *URINARY PROBLEMS (pain or burning when urinating, or frequent urination) *BOWEL PROBLEMS (unusual diarrhea, constipation, pain near the anus) TENDERNESS IN MOUTH AND THROAT WITH OR WITHOUT PRESENCE OF ULCERS (sore throat, sores in mouth, or a toothache) UNUSUAL RASH, SWELLING OR PAIN  UNUSUAL VAGINAL DISCHARGE OR ITCHING   Items with * indicate a potential emergency and  should be followed up as soon as possible or go to the Emergency Department if any problems should occur.  Please show the CHEMOTHERAPY ALERT CARD or IMMUNOTHERAPY ALERT CARD at check-in to the Emergency Department and triage nurse.  Should you have questions after your visit or need to cancel or reschedule your appointment, please contact [facility] [place] - A DEPT OF [facility] [phone]  and follow the prompts.  Office hours are 8:00 a.m. to 4:30 p.m. Monday - Friday. Please note that voicemails left after 4:00 p.m. may not be returned until the following business day.  We are closed weekends and major holidays. You have access to a nurse at all times for urgent questions. Please call the main number to the clinic [phone] and follow the prompts.  For any non-urgent questions, you may also contact your provider using MyChart. We now offer e-Visits for anyone 64 and older to request care online for non-urgent symptoms. For details visit [email or web address].   Also download the MyChart app! Go to the app store, search MyChart, open the app, select [facility], and log in with your MyChart username and password.

## 2024-07-28 NOTE — Progress Notes (Signed)
 ANC 0.8 and Platelets 92 today.  Oncologist made aware.   Give 480 gcsf and darzalex  verbal order Dr. Katragadda.   Patient tolerated Daratumumab , Xgeva , and Zarxio   injection with no complaints voiced.  See MAR for details.  Labs reviewed. Injection site clean and dry with no bruising or swelling noted at site.  Band aid applied.  Vss with discharge and left in satisfactory condition with no s/s of distress noted.

## 2024-07-29 LAB — KAPPA/LAMBDA LIGHT CHAINS
Kappa free light chain: 28.3 mg/L — ABNORMAL HIGH (ref 3.3–19.4)
Kappa, lambda light chain ratio: 7.65 — ABNORMAL HIGH (ref 0.26–1.65)
Lambda free light chains: 3.7 mg/L — ABNORMAL LOW (ref 5.7–26.3)

## 2024-08-01 LAB — PROTEIN ELECTROPHORESIS, SERUM
A/G Ratio: 1.4 (ref 0.7–1.7)
Albumin ELP: 3.1 g/dL (ref 2.9–4.4)
Alpha-1-Globulin: 0.2 g/dL (ref 0.0–0.4)
Alpha-2-Globulin: 0.6 g/dL (ref 0.4–1.0)
Beta Globulin: 0.7 g/dL (ref 0.7–1.3)
Gamma Globulin: 0.7 g/dL (ref 0.4–1.8)
Globulin, Total: 2.2 g/dL (ref 2.2–3.9)
M-Spike, %: 0.5 g/dL — ABNORMAL HIGH
Total Protein ELP: 5.3 g/dL — ABNORMAL LOW (ref 6.0–8.5)

## 2024-08-02 LAB — IMMUNOFIXATION ELECTROPHORESIS
IgA: 15 mg/dL — ABNORMAL LOW (ref 61–437)
IgG (Immunoglobin G), Serum: 737 mg/dL (ref 603–1613)
IgM (Immunoglobulin M), Srm: 15 mg/dL (ref 15–143)
Total Protein ELP: 5.4 g/dL — ABNORMAL LOW (ref 6.0–8.5)

## 2024-08-05 ENCOUNTER — Other Ambulatory Visit: Payer: Self-pay | Admitting: *Deleted

## 2024-08-05 DIAGNOSIS — C61 Malignant neoplasm of prostate: Secondary | ICD-10-CM

## 2024-08-05 DIAGNOSIS — C9000 Multiple myeloma not having achieved remission: Secondary | ICD-10-CM

## 2024-08-08 DIAGNOSIS — L851 Acquired keratosis [keratoderma] palmaris et plantaris: Secondary | ICD-10-CM | POA: Diagnosis not present

## 2024-08-08 DIAGNOSIS — M79671 Pain in right foot: Secondary | ICD-10-CM | POA: Diagnosis not present

## 2024-08-08 DIAGNOSIS — M79672 Pain in left foot: Secondary | ICD-10-CM | POA: Diagnosis not present

## 2024-08-08 DIAGNOSIS — G629 Polyneuropathy, unspecified: Secondary | ICD-10-CM | POA: Diagnosis not present

## 2024-08-08 DIAGNOSIS — M2011 Hallux valgus (acquired), right foot: Secondary | ICD-10-CM | POA: Diagnosis not present

## 2024-08-15 ENCOUNTER — Other Ambulatory Visit: Payer: Self-pay | Admitting: Medical Genetics

## 2024-08-16 ENCOUNTER — Other Ambulatory Visit

## 2024-08-16 ENCOUNTER — Other Ambulatory Visit: Payer: Self-pay

## 2024-08-16 DIAGNOSIS — C9000 Multiple myeloma not having achieved remission: Secondary | ICD-10-CM

## 2024-08-16 DIAGNOSIS — Z006 Encounter for examination for normal comparison and control in clinical research program: Secondary | ICD-10-CM

## 2024-08-16 MED ORDER — LENALIDOMIDE 15 MG PO CAPS: ORAL_CAPSULE | ORAL | 0 refills | Status: DC

## 2024-08-16 NOTE — Telephone Encounter (Signed)
 Chart reviewed. (Revlimid /Pomalyst) refilled per last office note with Dr. Rogers and verbal order from Dr. Davonna.

## 2024-08-18 ENCOUNTER — Encounter: Payer: Self-pay | Admitting: Internal Medicine

## 2024-08-18 ENCOUNTER — Inpatient Hospital Stay

## 2024-08-18 ENCOUNTER — Ambulatory Visit (INDEPENDENT_AMBULATORY_CARE_PROVIDER_SITE_OTHER): Payer: Medicare HMO | Admitting: Internal Medicine

## 2024-08-18 VITALS — BP 138/68 | HR 63 | Ht 74.0 in | Wt 191.0 lb

## 2024-08-18 DIAGNOSIS — C9000 Multiple myeloma not having achieved remission: Secondary | ICD-10-CM | POA: Diagnosis not present

## 2024-08-18 DIAGNOSIS — M5416 Radiculopathy, lumbar region: Secondary | ICD-10-CM

## 2024-08-18 DIAGNOSIS — G609 Hereditary and idiopathic neuropathy, unspecified: Secondary | ICD-10-CM

## 2024-08-18 DIAGNOSIS — I471 Supraventricular tachycardia, unspecified: Secondary | ICD-10-CM

## 2024-08-18 DIAGNOSIS — Z5111 Encounter for antineoplastic chemotherapy: Secondary | ICD-10-CM | POA: Diagnosis not present

## 2024-08-18 DIAGNOSIS — C61 Malignant neoplasm of prostate: Secondary | ICD-10-CM

## 2024-08-18 LAB — CBC WITH DIFFERENTIAL/PLATELET
Abs Immature Granulocytes: 0 K/uL (ref 0.00–0.07)
Basophils Absolute: 0 K/uL (ref 0.0–0.1)
Basophils Relative: 2 %
Eosinophils Absolute: 0.3 K/uL (ref 0.0–0.5)
Eosinophils Relative: 13 %
HCT: 29.9 % — ABNORMAL LOW (ref 39.0–52.0)
Hemoglobin: 10.1 g/dL — ABNORMAL LOW (ref 13.0–17.0)
Immature Granulocytes: 0 %
Lymphocytes Relative: 14 %
Lymphs Abs: 0.3 K/uL — ABNORMAL LOW (ref 0.7–4.0)
MCH: 33.1 pg (ref 26.0–34.0)
MCHC: 33.8 g/dL (ref 30.0–36.0)
MCV: 98 fL (ref 80.0–100.0)
Monocytes Absolute: 0.3 K/uL (ref 0.1–1.0)
Monocytes Relative: 13 %
Neutro Abs: 1.1 K/uL — ABNORMAL LOW (ref 1.7–7.7)
Neutrophils Relative %: 58 %
Platelets: 128 K/uL — ABNORMAL LOW (ref 150–400)
RBC: 3.05 MIL/uL — ABNORMAL LOW (ref 4.22–5.81)
RDW: 16.3 % — ABNORMAL HIGH (ref 11.5–15.5)
WBC: 1.9 K/uL — ABNORMAL LOW (ref 4.0–10.5)
nRBC: 0 % (ref 0.0–0.2)

## 2024-08-18 LAB — COMPREHENSIVE METABOLIC PANEL WITH GFR
ALT: 19 U/L (ref 0–44)
AST: 21 U/L (ref 15–41)
Albumin: 3.2 g/dL — ABNORMAL LOW (ref 3.5–5.0)
Alkaline Phosphatase: 35 U/L — ABNORMAL LOW (ref 38–126)
Anion gap: 7 (ref 5–15)
BUN: 18 mg/dL (ref 8–23)
CO2: 26 mmol/L (ref 22–32)
Calcium: 8.2 mg/dL — ABNORMAL LOW (ref 8.9–10.3)
Chloride: 106 mmol/L (ref 98–111)
Creatinine, Ser: 1.14 mg/dL (ref 0.61–1.24)
GFR, Estimated: >60 mL/min (ref >60)
Glucose, Bld: 99 mg/dL (ref 70–99)
Potassium: 3.5 mmol/L (ref 3.5–5.1)
Sodium: 139 mmol/L (ref 135–145)
Total Bilirubin: 0.6 mg/dL (ref 0.0–1.2)
Total Protein: 6 g/dL — ABNORMAL LOW (ref 6.5–8.1)

## 2024-08-18 LAB — MAGNESIUM: Magnesium: 1.8 mg/dL (ref 1.7–2.4)

## 2024-08-18 NOTE — Assessment & Plan Note (Signed)
 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 Seen by EP cardiology

## 2024-08-18 NOTE — Assessment & Plan Note (Addendum)
 Could be due to lumbar spinal stenosis or MM related pelvic mass or chemotherapy-induced He prefers to avoid Gabapentin, and tried Lyrica  without much benefit Started Cymbalta  30 mg QD - increased dose to 60 mg due to persistent symptoms in the last visit, now better

## 2024-08-18 NOTE — Assessment & Plan Note (Signed)
 Chronic low back pain radiating to legs with numbness and tingling, has had PT X-ray of the lumbar spine showed degenerative changes MRI of lumbar spine showed lumbar spinal stenosis at L5-S1 and foraminal narrowing at L4-L5 Has been evaluated by Orthopedic surgery

## 2024-08-18 NOTE — Progress Notes (Signed)
 Luis Stewart presented for Portacath access and flush. Proper placement of portacath confirmed by CXR. Portacath located left chest wall accessed with  H 20 needle. Good blood return present. Portacath flushed with 20ml NS and needle removed intact. Procedure without incident. Patient tolerated procedure well.

## 2024-08-18 NOTE — Progress Notes (Signed)
 Established Patient Office Visit  Subjective:  Patient ID: Luis Stewart, male    DOB: Dec 13, 1951  Age: 73 y.o. MRN: [id number]  CC:  Chief Complaint  Patient presents with   Gastroesophageal Reflux    Six month follow up   Peripheral Neuropathy    Six month follow up    HPI Luis Stewart is a 73 y.o. male with past medical history of GERD, chronic low back pain, asbestosis and atypical chest pain who presents for f/u of his chronic medical conditions.  He has been taking Pantoprazole  QOD for GERD. Currently denies any nausea, vomiting, dysphagia or odynophagia. Has chronic constipation, but denies melena or hematochezia. Has tried OTC stool softener with some relief, but takes inconsistently.  Prostate cancer: Followed by urology and oncology.  He is on degarelix  for it.  Denies any dysuria or hematuria currently. Used to take tamsulosin  for urinary hesitancy.  Multiple myeloma: Followed by Dr. Rogers and Dr. Fernande.  He is on Revlimid  and infusion chemotherapy for it. He has had stem cell collection at [facility].  His back pain had improved since starting chemotherapy. Today, he reports low back pain, intermittent, radiating to right LE and unrelated to activity.  He still has mild numbness of the LE.  Denies any recent fall.  He has noticed improvement in neuropathy symptoms with Cymbalta  60 mg QD.  Past Medical History:  Diagnosis Date   Arthritis    Asbestosis (HCC)    Closed fracture of distal end of fibula with tibia with routine healing 05/28/2020   COVID-19 09/03/2021   Cyst of kidney, acquired    Cystic disease of liver    Enlarged prostate    Headache    History of fracture of leg    Right, childhood   Hx of migraine headaches    Hyperlipidemia    Multiple myeloma (HCC)    Neuropathy    PE (pulmonary thromboembolism) (HCC)    After knee surgery   Prostate cancer (HCC) 2015   Adenocarcinoma by biopsy    Past Surgical History:   Procedure Laterality Date   BIOPSY PROSTATE  2003 and 2005   COLONOSCOPY  11/28/2008   Dr. Rourk:internal hemorrhoids/tortus colon/ascending colon polyps, tubular adenomas   COLONOSCOPY N/A 09/04/2014   Procedure: COLONOSCOPY;  Surgeon: Lamar CHRISTELLA Hollingshead, MD;  [facility];  Service: Endoscopy;  Laterality: N/A;  9:30   COLONOSCOPY N/A 07/17/2017   Procedure: COLONOSCOPY;  Surgeon: Harvey Margo CROME, MD;  Location: [facility];  Service: Endoscopy;  Laterality: N/A;   COLONOSCOPY WITH PROPOFOL  N/A 10/05/2023   Procedure: COLONOSCOPY WITH PROPOFOL ;  Surgeon: Hollingshead Lamar CHRISTELLA, MD;  Location: [facility];  Service: Endoscopy;  Laterality: N/A;  10:00AM, ASA 3   ESOPHAGOGASTRODUODENOSCOPY N/A 07/16/2017   Procedure: ESOPHAGOGASTRODUODENOSCOPY (EGD);  Surgeon: Harvey Margo CROME, MD;  [facility]: [facility];  Service: Endoscopy;  Laterality: N/A;   GIVENS CAPSULE STUDY  07/16/2017   Procedure: GIVENS CAPSULE STUDY;  Surgeon: Harvey Margo CROME, MD;  Location: [facility];  Service: Endoscopy;;   JOINT REPLACEMENT N/A    [employer]rees[employer] 01/12/2021   POLYPECTOMY  07/17/2017   Procedure: POLYPECTOMY;  Surgeon: Harvey Margo CROME, MD;  Location: [facility];  Service: Endoscopy;;  cecal   POLYPECTOMY  10/05/2023   Procedure: POLYPECTOMY;  Surgeon: Hollingshead Lamar CHRISTELLA, MD;  Location: [facility];  Service: Endoscopy;;   PORTACATH PLACEMENT Left 04/18/2022   Procedure: INSERTION PORT-A-CATH;  Surgeon: Mavis Anes,  MD;  Location: [facility];  Service: General;  Laterality: Left;   TOTAL KNEE ARTHROPLASTY Right 06/10/2017   TOTAL KNEE ARTHROPLASTY Right 06/10/2017   Procedure: TOTAL KNEE ARTHROPLASTY;  Surgeon: Beverley Toribio FALCON, MD;  Location: [facility];  Service: Orthopedics;  Laterality: Right;    Family History  Problem Relation Age of Onset   Diabetes Mother    Hypertension Mother    Obesity Mother    Heart disease Mother    Mental illness Mother    Hypertension Sister    Obesity Sister    Arthritis  Sister    Deep vein thrombosis Father    Dementia Father    Heart disease Father        CABG   Colon cancer Neg Hx     Social History   Socioeconomic History   Marital status: Married    Spouse name: Idell ann   Number of children: Not on file   Years of education: Not on file   Highest education level: Not on file  Occupational History   Occupation: Retired  Tobacco Use   Smoking status: Never   Smokeless tobacco: Never  Vaping Use   Vaping status: Never Used  Substance and Sexual Activity   Alcohol use: No   Drug use: No   Sexual activity: Yes  Other Topics Concern   Not on file  Social History Narrative   Previously worked at [employer]   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   Social Drivers of Corporate investment banker Strain: Low Risk  (04/12/2024)   Overall Financial Resource Strain (CARDIA)    Difficulty of Paying Living Expenses: Not hard at all  Food Insecurity: No Food Insecurity (06/10/2024)   Hunger Vital Sign    Worried About Running Out of Food in the Last Year: Never true    Ran Out of Food in the Last Year: Never true  Transportation Needs: No Transportation Needs (06/10/2024)   PRAPARE - Administrator, Civil Service (Medical): No    Lack of Transportation (Non-Medical): No  Physical Activity: Sufficiently Active (04/12/2024)   Exercise Vital Sign    Days of Exercise per Week: 7 days    Minutes of Exercise per Session: 30 min  Stress: No Stress Concern Present (04/12/2024)   [employer] of Occupational Health - Occupational Stress Questionnaire    Feeling of Stress : Not at all  Social Connections: Socially Integrated (04/12/2024)   Social Connection and Isolation Panel    Frequency of Communication with Friends and Family: More than three times a week    Frequency of Social Gatherings with Friends and Family: More than three times a week    Attends Religious Services: More than 4 times per year     Active Member of [employer] or Organizations: Yes    Attends Engineer, structural: More than 4 times per year    Marital Status: Married  Catering manager Violence: Not At Risk (06/10/2024)   Humiliation, Afraid, Rape, and Kick questionnaire    Fear of Current or Ex-Partner: No    Emotionally Abused: No    Physically Abused: No    Sexually Abused: No    Outpatient Medications Prior to Visit  Medication Sig Dispense Refill   acetaminophen  (TYLENOL ) 500 MG tablet Take 1,000 mg by mouth every 6 (six) hours as needed for mild pain or moderate pain.     acyclovir  (ZOVIRAX ) 400 MG tablet  TAKE 1 TABLET BY MOUTH TWICE A DAY 180 tablet 2   Cholecalciferol (VITAMIN D ) 2000 units CAPS Take 2,000 Units by mouth every other day.     DULoxetine  (CYMBALTA ) 60 MG capsule TAKE 1 CAPSULE BY MOUTH EVERY DAY 90 capsule 2   ferrous sulfate 325 (65 FE) MG EC tablet Take 325 mg by mouth daily.     lenalidomide  (REVLIMID ) 15 MG capsule TAKE 1 CAPSULE BY MOUTH 1 TIME A DAY FOR 21 DAYS ON THEN 7 DAYS OFF 21 capsule 0   Multiple Vitamin (MULTIVITAMIN WITH MINERALS) TABS tablet Take 1 tablet by mouth daily with breakfast. Centrum Silver for Men     nitroGLYCERIN  (NITROSTAT ) 0.4 MG SL tablet Place 1 tablet (0.4 mg total) under the tongue every 5 (five) minutes x 3 doses as needed for chest pain (If no relief after 3rd dose, call or go to ED.). 30 tablet 0   pantoprazole  (PROTONIX ) 40 MG tablet TAKE 1 TABLET BY MOUTH EVERY OTHER DAY 45 tablet 1   potassium chloride  (KLOR-CON ) 10 MEQ tablet Take 1 tablet (10 mEq total) by mouth daily. 90 tablet 1   RESTASIS 0.05 % ophthalmic emulsion Place 1 drop into both eyes 2 (two) times daily.     rosuvastatin  (CRESTOR ) 5 MG tablet TAKE 1 TABLET (5 MG TOTAL) BY MOUTH DAILY. 90 tablet 3   No facility-administered medications prior to visit.    Allergies  Allergen Reactions   Sulfa Antibiotics Other (See Comments)    Unknown reaction. Childhood reaction    ROS Review of  Systems  Constitutional:  Negative for chills and fever.  HENT:  Negative for congestion and sore throat.   Eyes:  Negative for pain and discharge.  Respiratory:  Negative for cough and shortness of breath.   Cardiovascular:  Negative for chest pain and palpitations.  Gastrointestinal:  Positive for constipation. Negative for diarrhea, nausea and vomiting.  Endocrine: Negative for polydipsia and polyuria.  Genitourinary:  Negative for dysuria and hematuria.  Musculoskeletal:  Positive for back pain. Negative for neck pain and neck stiffness.  Skin:  Negative for rash.  Neurological:  Positive for numbness. Negative for dizziness, weakness and headaches.  Psychiatric/Behavioral:  Negative for agitation and behavioral problems.       Objective:    Physical Exam Vitals reviewed.  Constitutional:      General: He is not in acute distress.    Appearance: He is not diaphoretic.  HENT:     Head: Normocephalic and atraumatic.     Nose: Nose normal.     Mouth/Throat:     Mouth: Mucous membranes are moist.  Eyes:     General: No scleral icterus.    Extraocular Movements: Extraocular movements intact.  Cardiovascular:     Rate and Rhythm: Normal rate and regular rhythm.     Heart sounds: Normal heart sounds. No murmur heard. Pulmonary:     Breath sounds: Normal breath sounds. No wheezing or rales.  Musculoskeletal:     Cervical back: Neck supple. No tenderness.     Lumbar back: No tenderness. Decreased range of motion.     Right lower leg: No edema.     Left lower leg: No edema.  Skin:    General: Skin is warm.     Findings: No rash.  Neurological:     General: No focal deficit present.     Mental Status: He is alert and oriented to person, place, and time.     Sensory: Sensory  deficit (B/l feet) present.     Motor: No weakness.  Psychiatric:        Mood and Affect: Mood normal.        Behavior: Behavior normal.     BP 138/68 (BP Location: Left Arm)   Pulse 63   Ht 6' 2  (1.88 m)   Wt 191 lb (86.6 kg)   SpO2 96%   BMI 24.52 kg/m  Wt Readings from Last 3 Encounters:  08/18/24 191 lb (86.6 kg)  07/28/24 187 lb (84.8 kg)  06/30/24 185 lb 3 oz (84 kg)    Lab Results  Component Value Date   TSH 2.381 03/17/2024   Lab Results  Component Value Date   WBC 1.9 (L) 08/18/2024   HGB 10.1 (L) 08/18/2024   HCT 29.9 (L) 08/18/2024   MCV 98.0 08/18/2024   PLT 128 (L) 08/18/2024   Lab Results  Component Value Date   NA 139 08/18/2024   K 3.5 08/18/2024   CO2 26 08/18/2024   GLUCOSE 99 08/18/2024   BUN 18 08/18/2024   CREATININE 1.14 08/18/2024   BILITOT 0.6 08/18/2024   ALKPHOS 35 (L) 08/18/2024   AST 21 08/18/2024   ALT 19 08/18/2024   PROT 6.0 (L) 08/18/2024   ALBUMIN 3.2 (L) 08/18/2024   CALCIUM  8.2 (L) 08/18/2024   ANIONGAP 7 08/18/2024   EGFR 48 (L) 02/06/2022   Lab Results  Component Value Date   CHOL 120 02/18/2024   Lab Results  Component Value Date   HDL 54 02/18/2024   Lab Results  Component Value Date   LDLCALC 57 02/18/2024   Lab Results  Component Value Date   TRIG 44 02/18/2024   Lab Results  Component Value Date   CHOLHDL 2.2 02/18/2024   Lab Results  Component Value Date   HGBA1C 5.6 03/17/2024      Assessment & Plan:   Problem List Items Addressed This Visit       Cardiovascular and Mediastinum   PSVT (paroxysmal supraventricular tachycardia) (HCC)   Had cardiac monitor for 2 weeks - Multiple (949) brief episodes of PSVT were noted, the longest of which was 10.6 seconds. Seen by EP cardiology        Nervous and Auditory   Lumbar radiculopathy - Primary   Chronic low back pain radiating to legs with numbness and tingling, has had PT X-ray of the lumbar spine showed degenerative changes MRI of lumbar spine showed lumbar spinal stenosis at L5-S1 and foraminal narrowing at L4-L5 Has been evaluated by Orthopedic surgery      Idiopathic peripheral neuropathy   Could be due to lumbar spinal stenosis or  MM related pelvic mass or chemotherapy-induced He prefers to avoid Gabapentin, and tried Lyrica  without much benefit Started Cymbalta  30 mg QD - increased dose to 60 mg due to persistent symptoms in the last visit, now better        Other   Multiple myeloma (HCC)   On Revlimid  and infusion chemotherapy Followed by oncology and Dr. Fernande          No orders of the defined types were placed in this encounter.   Follow-up: Return in about 6 months (around 02/18/2025) for Annual physical (after 02/19/25).    Suzzane MARLA Blanch, MD

## 2024-08-18 NOTE — Patient Instructions (Signed)
Please continue to take medications as prescribed. ? ?Please continue to follow low salt diet and ambulate as tolerated. ?

## 2024-08-18 NOTE — Assessment & Plan Note (Signed)
 On Revlimid  and infusion chemotherapy Followed by oncology and Dr. Fernande

## 2024-08-19 LAB — KAPPA/LAMBDA LIGHT CHAINS
Kappa free light chain: 31 mg/L — ABNORMAL HIGH (ref 3.3–19.4)
Kappa, lambda light chain ratio: 8.86 — ABNORMAL HIGH (ref 0.26–1.65)
Lambda free light chains: 3.5 mg/L — ABNORMAL LOW (ref 5.7–26.3)

## 2024-08-23 LAB — PROTEIN ELECTROPHORESIS, SERUM
A/G Ratio: 1.4 (ref 0.7–1.7)
Albumin ELP: 3.2 g/dL (ref 2.9–4.4)
Alpha-1-Globulin: 0.2 g/dL (ref 0.0–0.4)
Alpha-2-Globulin: 0.6 g/dL (ref 0.4–1.0)
Beta Globulin: 0.8 g/dL (ref 0.7–1.3)
Gamma Globulin: 0.7 g/dL (ref 0.4–1.8)
Globulin, Total: 2.3 g/dL (ref 2.2–3.9)
M-Spike, %: 0.5 g/dL — ABNORMAL HIGH
Total Protein ELP: 5.5 g/dL — ABNORMAL LOW (ref 6.0–8.5)

## 2024-08-24 NOTE — Progress Notes (Signed)
 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 male was transferred to my care after his prior physician has left.   ASSESSMENT & PLAN:   Assessment & Plan: [name]  is a 73 y.o. male with metastatic castrate sensitive prostate cancer and IgG kappa multiple myeloma  Assessment & Plan Multiple myeloma not having achieved remission (HCC) IgG kappa multiple myeloma (standard cytogenetics) s/p induction with 8 cycles of D-KRD Patient had stem cells collected but did not undergo stem cell transplant Was on maintenance Revlimid  prior to biochemical recurrence recently Started on daratumumab  and was evaluated for CAR-T by Dr. Fernande at [facility] reviewed today: CMP: WNL, CBC: WBC: 1.9, ANC: 1100, hemoglobin: 10.1, platelets: 128. - SPEP: M spike: 0.5, kappa free light chain: 31, lambda free light chain: 3.5, ratio: 8.86 -Discussed with the patient that multiple myeloma labs are more or less stable and to continue current regimen of daratumumab  and Revlimid . - Patient has stable leukopenia and can consider dose reduction of Revlimid  if worsens - Continue to follow with Dr. Fernande for CAR-T treatment.  Will coordinate care with Dr. 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  He knows to contact our office if he develops concerns prior to his next appointment.  60 minutes of total time was spent for this patient encounter, including preparation,review of records,  face-to-face counseling with the patient and coordination of care, physical exam, and documentation of the encounter.    [name],acting as a Neurosurgeon for Mickiel Dry, MD.,have documented all relevant documentation on the behalf of Mickiel Dry, MD,as directed by  Mickiel Dry, MD while in the presence of Mickiel Dry, MD.  I, Mickiel Dry MD, have reviewed the above documentation for accuracy and completeness, and I agree with the above.    Mickiel Dry, MD  [facility] [place] - A DEPT OF [facility] [address] [place] [address] Dept: [phone] Dept Fax: [phone]   Orders Placed This Encounter  Procedures   CBC with Differential    Standing Status:   Future    Expected Date:   09/15/2024    Expiration Date:   12/14/2024   Comprehensive metabolic panel    Standing Status:   Future    Expected Date:   09/15/2024    Expiration Date:   12/14/2024   Kappa/lambda light chains    Standing Status:   Future    Expected Date:  09/15/2024    Expiration Date:   12/14/2024   Protein electrophoresis, serum    Standing Status:   Future    Expected Date:   09/15/2024    Expiration Date:   12/14/2024   IFE, Dara-Specific, Serum    Standing Status:   Future    Expected Date:   09/15/2024    Expiration Date:   12/14/2024     ONCOLOGY HISTORY:   I have  reviewed his chart and materials related to his cancer extensively and collaborated history with the patient. 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  Pathology: 1/12 cores positive for prostatic adenocarcinoma. Gleason score 3+3=6 involves 10% of Right lateral base core.  Prostate volume 175 cc. - 06/20/2015: PSA 16.66.  PSA density 0.18.  -10/09/2015: PSA 17.16 -12/18/2015: Prostate MRI: No findings specific for macroscopic prostate cancer.  -01/03/2016: Patient evaluated by Dr. Cam [Urology] for robotic prostatectomy and felt not to be a candidate for surgery due to low-volume prostate cancer and low PSA density. Dr. Cam recommended active surveillance.  -01/22/2016: Surveillance TRUS and biopsy.  Pathology: All 12 cores showed benign prostatic tissue. Prostate volume was 190 mL.  -2017-2022: Patient remained under surveillance with TRUS/biopsy and monitoring PSA -11/04/2016: PSA 15.4 -05/15/2017: PSA 17.9 -04/19/2018: PSA 18.4 -08/11/2018: Fusion TRUS and biopsy.  Pathology: 1/12 cores showed small focus of prostatic adenocarcinoma, Gleason score 3+3=6 involves 5% of left apex core with cancer length of 0.09 cm. Prostate volume 220 mL. No significant lesions.  No evidence of transcapsular, ureteral, SV/bony or lymphatic disease.  -02/15/2019: PSA 15.9 -03/20/2020: PSA 15.5 -03/20/2021: PSA 22.4 -09/18/2021: PSA 34.5 -11/27/2021: MRI lumbar spine: Expanded appearance of the sacrum with diffusely abnormal bone marrow signal and contrast enhancement, concerning for metastatic disease. -11/27/2021: CT pelvis: Extreme prostatomegaly. No evidence of pelvic lymphadenopathy or other findings to suggest metastatic disease. -03/13/2022: PS[facility] PET: Multifocal radiotracer avid prostate cancer skeletal metastasis. Broad lytic lesion within the sacrum with associated radiotracer activity.  Multiple discrete lesions in the thoracic lumbar spine and ribs with minimal CT change. Markedly enlarged prostate gland. Focal activity inferior LEFT aspect the gland is suggestive of prostate adenocarcinoma. No nodal metastasis in the abdomen pelvis. No visceral metastasis. -04/15/2022-11/11/2022: Degarelix  -05/06/2022-06/30/2024: PSA: 27.67-->0.26 -02/18/2023-current: Monthly Xgeva  -03/26/2023-current: Eligard  every 6 months   Diagnosis: IgG kappa multiple myeloma with complex cytogenetics   -02/26/2022: Initial Myeloma labs showed IgG monoclonal protein with kappa light chain specificity. IgG 4,990. FLC ratio 106.24 with elevated kappa light chains at 626.8. M spike 4.3. Gamma globulin 4.6. LDH 160.  -03/20/2022: Bone Marrow Biopsy.  -Pathology: The bone marrow core biopsy demonstrates hypercellular marrow for age (95%) with large and atypical appearing plasma cells arranged in sheets and attenuating the normal hematopoietic elements. By CD138 immunohistochemistry plasma cells comprise approximately 70% of the cellular elements and are kappa-restricted by light chain in situ hybridization.  The plasma cells do not express CD56.  Amyloid deposition is not seen by Congo red or crystal violet stain.  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  -04/15/2022-11/11/2022: 8 cycles of Dara KRD completed -10/2022- Current: Maintenance Revlimid  -11/25/2022: Stem cell collection has been completed at [facility]. Patient decided not to undergo stem  cell transplant. -06/30/2024: Daratumumab  added back to regimen for worsening M spike -07/29/2024: Consultation with Dr.McKay and patient is willing to proceed with CART  Current Treatment:  Daratumumab  +  Revlimid   INTERVAL HISTORY:   Luis Stewart is here today for follow up and establish care with me. Patient is accompanied by his wife.  Patient reports no complaints today.  He reports feeling 'pretty much' better overall. No significant fatigue or tiredness.  He is currently taking his Revlimid  and is supposed to restart on Monday.  He is getting Eligard  injections for his prostate cancer.  He met with Dr. Fernande at [facility] and has decided to proceed with CAR-T.   I have reviewed the past medical history, past surgical history, social history and family history with the patient and they are unchanged from previous note.  ALLERGIES:  is allergic to sulfa antibiotics.  MEDICATIONS:  Current Outpatient Medications  Medication Sig Dispense Refill   acetaminophen  (TYLENOL ) 500 MG tablet Take 1,000 mg by mouth every 6 (six) hours as needed for mild pain or moderate pain.     acyclovir  (ZOVIRAX ) 400 MG tablet TAKE 1 TABLET BY MOUTH TWICE A DAY 180 tablet 2   Cholecalciferol (VITAMIN D ) 2000 units CAPS Take 2,000 Units by mouth every other day.     DULoxetine  (CYMBALTA ) 60 MG capsule TAKE 1 CAPSULE BY MOUTH EVERY DAY 90 capsule 2   ferrous sulfate 325 (65 FE) MG EC tablet Take 325 mg by mouth daily.     lenalidomide  (REVLIMID ) 15 MG capsule TAKE 1 CAPSULE BY MOUTH 1 TIME A DAY FOR 21 DAYS ON THEN 7 DAYS OFF 21 capsule 0   Multiple Vitamin (MULTIVITAMIN WITH MINERALS) TABS tablet Take 1 tablet by mouth daily with breakfast. Centrum Silver for Men     nitroGLYCERIN  (NITROSTAT ) 0.4 MG SL tablet Place 1 tablet (0.4 mg total) under the tongue every 5 (five) minutes x 3 doses as needed for chest pain (If no relief after 3rd dose, call or go to ED.). 30 tablet 0   pantoprazole  (PROTONIX ) 40 MG tablet TAKE 1 TABLET BY MOUTH EVERY OTHER DAY 45 tablet 1   potassium chloride  (KLOR-CON ) 10 MEQ tablet Take 1 tablet (10 mEq total) by mouth daily. 90 tablet 1   RESTASIS 0.05 % ophthalmic emulsion Place 1  drop into both eyes 2 (two) times daily.     rosuvastatin  (CRESTOR ) 5 MG tablet TAKE 1 TABLET (5 MG TOTAL) BY MOUTH DAILY. 90 tablet 3   No current facility-administered medications for this visit.    REVIEW OF SYSTEMS:   Constitutional: Denies fevers, chills or abnormal weight loss Eyes: Denies blurriness of vision Ears, nose, mouth, throat, and face: Denies mucositis or sore throat Respiratory: Denies cough, dyspnea or wheezes Cardiovascular: Denies palpitation, chest discomfort or lower extremity swelling Gastrointestinal:  Denies nausea, heartburn or change in bowel habits Skin: Denies abnormal skin rashes Lymphatics: Denies new lymphadenopathy or easy bruising Neurological:Denies numbness, tingling or new weaknesses Behavioral/Psych: Mood is stable, no new changes  All other systems were reviewed with the patient and are negative.   VITALS:  Blood pressure 135/71, pulse 63, temperature 98 F (36.7 C), temperature source Tympanic, resp. rate 18, weight 191 lb 3.2 oz (86.7 kg), SpO2 100%.  Wt Readings from Last 3 Encounters:  08/25/24 191 lb 3.2 oz (86.7 kg)  08/18/24 191 lb (86.6 kg)  07/28/24 187 lb (84.8 kg)    Body mass index is 24.55  kg/m.  Performance status (ECOG): 1 - Symptomatic but completely ambulatory  PHYSICAL EXAM:   GENERAL:alert, no distress and comfortable SKIN: skin color, texture, turgor are normal, no rashes or significant lesions LYMPH:  no palpable lymphadenopathy in the cervical, axillary or inguinal LUNGS: clear to auscultation and percussion with normal breathing effort HEART: regular rate & rhythm and no murmurs and no lower extremity edema ABDOMEN:abdomen soft, non-tender and normal bowel sounds Musculoskeletal:no cyanosis of digits and no clubbing  NEURO: alert & oriented x 3 with fluent speech, no focal motor/sensory deficits  LABORATORY DATA:  I have reviewed the data as listed Lab Results  Component Value Date   WBC 1.9 (L) 08/18/2024    NEUTROABS 1.1 (L) 08/18/2024   HGB 10.1 (L) 08/18/2024   HCT 29.9 (L) 08/18/2024   MCV 98.0 08/18/2024   PLT 128 (L) 08/18/2024      Chemistry      Component Value Date/Time   NA 139 08/18/2024 1025   NA 138 02/06/2022 0940   K 3.5 08/18/2024 1025   CL 106 08/18/2024 1025   CO2 26 08/18/2024 1025   BUN 18 08/18/2024 1025   BUN 18 02/06/2022 0940   CREATININE 1.14 08/18/2024 1025   CREATININE 1.23 11/22/2021 1309      Component Value Date/Time   CALCIUM  8.2 (L) 08/18/2024 1025   ALKPHOS 35 (L) 08/18/2024 1025   AST 21 08/18/2024 1025   ALT 19 08/18/2024 1025   BILITOT 0.6 08/18/2024 1025   BILITOT 0.3 02/06/2022 0940       Latest Reference Range & Units 08/18/24 10:25  Total Protein ELP 6.0 - 8.5 g/dL 5.5 (L)  Albumin ELP 2.9 - 4.4 g/dL 3.2  Globulin, Total 2.2 - 3.9 g/dL 2.3 (C)  A/G Ratio 0.7 - 1.7  1.4 (C)  Alpha-1-Globulin 0.0 - 0.4 g/dL 0.2  Joeyj-7-Honalopw 0.4 - 1.0 g/dL 0.6  Beta Globulin 0.7 - 1.3 g/dL 0.8  Gamma Globulin 0.4 - 1.8 g/dL 0.7  M-SPIKE, % Not Observed g/dL 0.5 (H)  SPE Interp.  Comment  Comment  Comment  (L): Data is abnormally low (H): Data is abnormally high (C): Corrected   Latest Reference Range & Units 08/18/24 10:25  Kappa free light chain 3.3 - 19.4 mg/L 31.0 (H)  Lambda free light chains 5.7 - 26.3 mg/L 3.5 (L)  Kappa, lambda light chain ratio 0.26 - 1.65  8.86 (H)  (H): Data is abnormally high (L): Data is abnormally low   Latest Reference Range & Units 07/28/24 08:38  IgG (Immunoglobin G), Serum 603 - 1,613 mg/dL 262  IgM (Immunoglobulin M), Srm 15 - 143 mg/dL 15  IgA 61 - 562 mg/dL 15 (L)  (L): Data is abnormally low   Latest Reference Range & Units 06/02/24 11:56  Iron 45 - 182 ug/dL 45  UIBC ug/dL 725  TIBC 749 - 549 ug/dL 680  Saturation Ratios 17.9 - 39.5 % 14 (L)  Ferritin 24 - 336 ng/mL 398 (H)  (L): Data is abnormally low (H): Data is abnormally high  RADIOGRAPHIC STUDIES: I have personally reviewed the  radiological images as listed and agreed with the findings in the report.  NM PET Image Restage (PS) Whole Body CLINICAL DATA:  Subsequent treatment strategy for restaging of multiple myeloma, on chemotherapy.  EXAM: NUCLEAR MEDICINE PET WHOLE BODY  TECHNIQUE: 9.8 mCi F-18 FDG was injected intravenously. Full-ring PET imaging was performed from the head to foot after the radiotracer. CT data was obtained and used  for attenuation correction and anatomic localization.  Fasting blood glucose: 95 mg/dl  COMPARISON:  95/86/7976  FINDINGS: Mediastinal blood pool activity: SUV max 2.0  HEAD/NECK: No areas of abnormal hypermetabolism.  Incidental CT findings: No cervical adenopathy.  CHEST: No pulmonary parenchymal or thoracic nodal hypermetabolism.  Incidental CT findings: Left Port-A-Cath tip at mid to low SVC. Mild cardiomegaly. No thoracic adenopathy.  ABDOMEN/PELVIS: No abdominopelvic nodal hypermetabolism. Mild bilateral adrenal hypermetabolism is likely physiologic; no correlate nodule or mass.  Hypermetabolism corresponding to lateral right abdominal wall subcutaneous fat stranding including at a S.U.V. max of 5.3 on 234/202. New.  Incidental CT findings: Multiple hepatic cysts. Upper pole right renal 1.0 cm low-density lesion is likely a cyst . In the absence of clinically indicated signs/symptoms require(s) no independent follow-up. Abdominal aortic atherosclerosis. Moderate to marked prostatomegaly with median lobe impression into the bladder.  SKELETON: The previous hypermetabolism associated with sacral and left iliac cortical thickening/coarsening has resolved. CT appearance is relatively similar.  No new marrow hypermetabolism.  Incidental CT findings: none  EXTREMITIES: No areas of abnormal hypermetabolism.  Incidental CT findings: Right knee arthroplasty. No suspicious focal osseous lesion  IMPRESSION: 1. No findings of tracer avid myeloma. 2. 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 3. Mild hypermetabolism corresponding to new right abdominal wall interstitial thickening. Correlate with instrumentation or trauma in this area. 4. A left Port-A-Cath tip at mid to low SVC. 5.  Aortic Atherosclerosis (ICD10-I70.0). 6. Moderate to marked prostatomegaly.  Electronically Signed   By: Rockey Kilts M.D.   On: 06/22/2024 08:52

## 2024-08-25 ENCOUNTER — Inpatient Hospital Stay: Attending: Hematology | Admitting: Oncology

## 2024-08-25 ENCOUNTER — Inpatient Hospital Stay

## 2024-08-25 VITALS — BP 135/71 | HR 63 | Temp 98.0°F | Resp 18 | Wt 191.2 lb

## 2024-08-25 DIAGNOSIS — Z79620 Long term (current) use of immunosuppressive biologic: Secondary | ICD-10-CM | POA: Diagnosis not present

## 2024-08-25 DIAGNOSIS — C61 Malignant neoplasm of prostate: Secondary | ICD-10-CM | POA: Insufficient documentation

## 2024-08-25 DIAGNOSIS — Z5112 Encounter for antineoplastic immunotherapy: Secondary | ICD-10-CM | POA: Diagnosis present

## 2024-08-25 DIAGNOSIS — C9000 Multiple myeloma not having achieved remission: Secondary | ICD-10-CM | POA: Diagnosis not present

## 2024-08-25 DIAGNOSIS — D509 Iron deficiency anemia, unspecified: Secondary | ICD-10-CM | POA: Insufficient documentation

## 2024-08-25 MED ORDER — ACETAMINOPHEN 325 MG PO TABS
650.0000 mg | ORAL_TABLET | Freq: Once | ORAL | Status: AC
  Administered 2024-08-25: 650 mg via ORAL
  Filled 2024-08-25: qty 2

## 2024-08-25 MED ORDER — DIPHENHYDRAMINE HCL 25 MG PO CAPS
50.0000 mg | ORAL_CAPSULE | Freq: Once | ORAL | Status: AC
  Administered 2024-08-25: 50 mg via ORAL
  Filled 2024-08-25: qty 2

## 2024-08-25 MED ORDER — DARATUMUMAB-HYALURONIDASE-FIHJ 1800-30000 MG-UT/15ML ~~LOC~~ SOLN
1800.0000 mg | Freq: Once | SUBCUTANEOUS | Status: AC
  Administered 2024-08-25: 1800 mg via SUBCUTANEOUS
  Filled 2024-08-25: qty 15

## 2024-08-25 MED ORDER — DEXAMETHASONE 4 MG PO TABS
20.0000 mg | ORAL_TABLET | Freq: Once | ORAL | Status: AC
Start: 2024-08-25 — End: 2024-08-25
  Administered 2024-08-25: 20 mg via ORAL
  Filled 2024-08-25: qty 5

## 2024-08-25 MED ORDER — DENOSUMAB 120 MG/1.7ML ~~LOC~~ SOLN
120.0000 mg | Freq: Once | SUBCUTANEOUS | Status: AC
  Administered 2024-08-25: 120 mg via SUBCUTANEOUS
  Filled 2024-08-25: qty 1.7

## 2024-08-25 NOTE — Assessment & Plan Note (Signed)
 IgG kappa multiple myeloma (standard cytogenetics) s/p induction with 8 cycles of D-KRD Patient had stem cells collected but did not undergo stem cell transplant Was on maintenance Revlimid  prior to biochemical recurrence recently Started on daratumumab  and was evaluated for CAR-T by Dr. Fernande at [facility] reviewed today: CMP: WNL, CBC: WBC: 1.9, ANC: 1100, hemoglobin: 10.1, platelets: 128. - SPEP: M spike: 0.5, kappa free light chain: 31, lambda free light chain: 3.5, ratio: 8.86 -Discussed with the patient that multiple myeloma labs are more or less stable and to continue current regimen of daratumumab  and Revlimid . - Patient has stable leukopenia and can consider dose reduction of Revlimid  if worsens - Continue to follow with Dr. Fernande for CAR-T treatment.  Will coordinate care with Dr. Fernande  Return to clinic in 2 months with labs

## 2024-08-25 NOTE — Assessment & Plan Note (Addendum)
 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

## 2024-08-25 NOTE — Progress Notes (Signed)
 Labs reviewed. Per MD to proceed with xgeva  injection today. Per pt he takes Calcium  and vitamin D  at home. Patient tolerated Xgeva   injection with no complaints voiced.  Site clean and dry with no bruising or swelling noted at site.  See MAR for details.  Band aid applied.  Patient stable during and after injection.  Vss with discharge and left in satisfactory condition with no s/s of distress noted. All follow ups as scheduled.   Luis Stewart

## 2024-08-25 NOTE — Patient Instructions (Signed)

## 2024-08-25 NOTE — Assessment & Plan Note (Signed)
 Patient has mild iron deficiency with a TSAT of less than 20  - Continue oral iron every other day

## 2024-08-25 NOTE — Progress Notes (Signed)
 Patient is taking Revlimid  as prescribed. He has not missed any doses and reports no side effects at this time.    Patient has been examined by Dr. Davonna. Vital signs and labs have been reviewed by MD - ANC (1.1), Creatinine, LFTs, hemoglobin, and platelets have been reviewed by M.D. - pt may proceed with treatment.  Primary RN and pharmacy notified.

## 2024-08-25 NOTE — Progress Notes (Signed)
 Patient presents today for Daratumumab  injection per providers order.  Vital signs and labs reviewed by MD.  Message received from Dionne Piety RN/Dr. Davonna patient okay for treatment.  Stable during injection without adverse affects.  Vital signs stable.  No complaints at this time.  Discharge from clinic ambulatory in stable condition.  Alert and oriented X 3.  Follow up with [facility] [place] as scheduled.

## 2024-08-26 LAB — GENECONNECT MOLECULAR SCREEN: Genetic Analysis Overall Interpretation: NEGATIVE

## 2024-08-26 LAB — IMMUNOFIXATION ELECTROPHORESIS
IgA: 12 mg/dL — ABNORMAL LOW (ref 61–437)
IgG (Immunoglobin G), Serum: 765 mg/dL (ref 603–1613)
IgM (Immunoglobulin M), Srm: 13 mg/dL — ABNORMAL LOW (ref 15–143)
Total Protein ELP: 5.6 g/dL — ABNORMAL LOW (ref 6.0–8.5)

## 2024-09-05 ENCOUNTER — Ambulatory Visit (HOSPITAL_COMMUNITY)
Admission: RE | Admit: 2024-09-05 | Discharge: 2024-09-05 | Disposition: A | Discharge status: Home / Self Care | Source: Ambulatory Visit | Attending: Oncology | Admitting: Oncology

## 2024-09-05 ENCOUNTER — Other Ambulatory Visit: Payer: Self-pay | Admitting: *Deleted

## 2024-09-05 ENCOUNTER — Telehealth: Payer: Self-pay | Admitting: *Deleted

## 2024-09-05 DIAGNOSIS — C9000 Multiple myeloma not having achieved remission: Secondary | ICD-10-CM

## 2024-09-05 DIAGNOSIS — J984 Other disorders of lung: Secondary | ICD-10-CM | POA: Diagnosis not present

## 2024-09-05 DIAGNOSIS — Z7969 Long term (current) use of other immunomodulators and immunosuppressants: Secondary | ICD-10-CM

## 2024-09-05 DIAGNOSIS — R051 Acute cough: Secondary | ICD-10-CM | POA: Diagnosis not present

## 2024-09-05 DIAGNOSIS — R918 Other nonspecific abnormal finding of lung field: Secondary | ICD-10-CM | POA: Diagnosis not present

## 2024-09-05 DIAGNOSIS — R0602 Shortness of breath: Secondary | ICD-10-CM | POA: Diagnosis not present

## 2024-09-05 MED ORDER — IOHEXOL 300 MG/ML  SOLN
80.0000 mL | Freq: Once | INTRAMUSCULAR | Status: AC | PRN
  Administered 2024-09-05: 80 mL via INTRAVENOUS

## 2024-09-05 NOTE — Telephone Encounter (Signed)
 Received call from patient to advise that he has had productive cough since Thursday that has gotten progressively worse with some SOB, however denies fever a this point.  Has an appointment to see PCP tomorrow.  Discussed with Dr. Davonna.  Due to treatment with Daratumumab , she would like for patient to have CT Chest W contrast.  Order placed and scheduled for today.  Patient is aware.

## 2024-09-06 ENCOUNTER — Ambulatory Visit (INDEPENDENT_AMBULATORY_CARE_PROVIDER_SITE_OTHER): Payer: Self-pay | Admitting: Internal Medicine

## 2024-09-06 ENCOUNTER — Encounter: Payer: Self-pay | Admitting: Internal Medicine

## 2024-09-06 VITALS — BP 98/64 | HR 82 | Ht 74.0 in | Wt 185.0 lb

## 2024-09-06 DIAGNOSIS — B9689 Other specified bacterial agents as the cause of diseases classified elsewhere: Secondary | ICD-10-CM | POA: Insufficient documentation

## 2024-09-06 DIAGNOSIS — J208 Acute bronchitis due to other specified organisms: Secondary | ICD-10-CM | POA: Diagnosis not present

## 2024-09-06 DIAGNOSIS — C9000 Multiple myeloma not having achieved remission: Secondary | ICD-10-CM

## 2024-09-06 MED ORDER — ALBUTEROL SULFATE HFA 108 (90 BASE) MCG/ACT IN AERS: 2.0000 | INHALATION_SPRAY | Freq: Four times a day (QID) | RESPIRATORY_TRACT | 0 refills | Status: DC | PRN

## 2024-09-06 MED ORDER — GUAIFENESIN-CODEINE 100-10 MG/5ML PO SOLN: 5.0000 mL | Freq: Three times a day (TID) | ORAL | 0 refills | Status: DC | PRN

## 2024-09-06 MED ORDER — AZITHROMYCIN 250 MG PO TABS: ORAL_TABLET | ORAL | 0 refills | Status: AC

## 2024-09-06 MED ORDER — METHYLPREDNISOLONE 4 MG PO TBPK: ORAL_TABLET | ORAL | 0 refills | Status: DC

## 2024-09-06 NOTE — Progress Notes (Unsigned)
 Acute Office Visit  Subjective:    Patient ID: Luis Stewart, male    DOB: [birth year], 73 y.o.   MRN: [id number]  Chief Complaint  Patient presents with   Cough    Ongoing since 09/01/24, has been having cough, congestion , and body aches.     HPI Patient is in today for complaint of cough with clear expectoration, chest congestion and mild dyspnea for the last 1 week.  He also reports fatigue and myalgias.  Denies any fever or chills.  He had CT chest done on 09/05/24, which showed airway thickening, suggestive of possible bronchitis and dependent mucus in the trachea and left mainstem bronchus.  He has not tried any OTC antitussive  yet.  Past Medical History:  Diagnosis Date   Arthritis    Asbestosis (HCC)    Closed fracture of distal end of fibula with tibia with routine healing 05/28/2020   COVID-19 09/03/2021   Cyst of kidney, acquired    Cystic disease of liver    Enlarged prostate    Headache    History of fracture of leg    Right, childhood   Hx of migraine headaches    Hyperlipidemia    Multiple myeloma (HCC)    Neuropathy    PE (pulmonary thromboembolism) (HCC)    After knee surgery   Prostate cancer (HCC) 2015   Adenocarcinoma by biopsy    Past Surgical History:  Procedure Laterality Date   BIOPSY PROSTATE  2003 and 2005   COLONOSCOPY  11/28/2008   Dr. Rourk:internal hemorrhoids/tortus colon/ascending colon polyps, tubular adenomas   COLONOSCOPY N/A 09/04/2014   Procedure: COLONOSCOPY;  Surgeon: Lamar CHRISTELLA Hollingshead, MD;  [facility];  Service: Endoscopy;  Laterality: N/A;  9:30   COLONOSCOPY N/A 07/17/2017   Procedure: COLONOSCOPY;  Surgeon: Harvey Margo CROME, MD;  [facility];  Service: Endoscopy;  Laterality: N/A;   COLONOSCOPY WITH PROPOFOL  N/A 10/05/2023   Procedure: COLONOSCOPY WITH PROPOFOL ;  Surgeon: Hollingshead Lamar CHRISTELLA, MD;  [facility];  Service: Endoscopy;  Laterality: N/A;  10:00AM, ASA 3    ESOPHAGOGASTRODUODENOSCOPY N/A 07/16/2017   Procedure: ESOPHAGOGASTRODUODENOSCOPY (EGD);  Surgeon: Harvey Margo CROME, MD;  [facility];  Service: Endoscopy;  Laterality: N/A;   GIVENS CAPSULE STUDY  07/16/2017   Procedure: GIVENS CAPSULE STUDY;  Surgeon: Harvey Margo CROME, MD;  [facility];  Service: Endoscopy;;   JOINT REPLACEMENT N/A    Phreesia 01/12/2021   POLYPECTOMY  07/17/2017   Procedure: POLYPECTOMY;  Surgeon: Harvey Margo CROME, MD;  [facility];  Service: Endoscopy;;  cecal   POLYPECTOMY  10/05/2023   Procedure: POLYPECTOMY;  Surgeon: Hollingshead Lamar CHRISTELLA, MD;  [facility]: [facility];  Service: Endoscopy;;   PORTACATH PLACEMENT Left 04/18/2022   Procedure: INSERTION PORT-A-CATH;  Surgeon: Mavis Anes, MD;  Location: [facility];  Service: General;  Laterality: Left;   TOTAL KNEE ARTHROPLASTY Right 06/10/2017   TOTAL KNEE ARTHROPLASTY Right 06/10/2017   Procedure: TOTAL KNEE ARTHROPLASTY;  Surgeon: Beverley Toribio FALCON, MD;  Location: [facility];  Service: Orthopedics;  Laterality: Right;    Family History  Problem Relation Age of Onset   Diabetes Mother    Hypertension Mother    Obesity Mother    Heart disease Mother    Mental illness Mother    Hypertension Sister    Obesity Sister    Arthritis Sister    Deep vein thrombosis Father    Dementia Father  Heart disease Father        CABG   Colon cancer Neg Hx     Social History   Socioeconomic History   Marital status: Married    Spouse name: Idell caldron   Number of children: Not on file   Years of education: Not on file   Highest education level: Not on file  Occupational History   Occupation: Retired  Tobacco Use   Smoking status: Never   Smokeless tobacco: Never  Vaping Use   Vaping status: Never Used  Substance and Sexual Activity   Alcohol use: No   Drug use: No   Sexual activity: Yes  Other Topics Concern   Not on file  Social History Narrative   Previously worked at [employer]   Has a Engineer, 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   Social Drivers of Corporate investment banker Strain: Low Risk  (04/12/2024)   Overall Financial Resource Strain (CARDIA)    Difficulty of Paying Living Expenses: Not hard at all  Food Insecurity: No Food Insecurity (06/10/2024)   Hunger Vital Sign    Worried About Running Out of Food in the Last Year: Never true    Ran Out of Food in the Last Year: Never true  Transportation Needs: No Transportation Needs (06/10/2024)   PRAPARE - Administrator, Civil Service (Medical): No    Lack of Transportation (Non-Medical): No  Physical Activity: Sufficiently Active (04/12/2024)   Exercise Vital Sign    Days of Exercise per Week: 7 days    Minutes of Exercise per Session: 30 min  Stress: No Stress Concern Present (04/12/2024)   [employer] of Occupational Health - Occupational Stress Questionnaire    Feeling of Stress : Not at all  Social Connections: Socially Integrated (04/12/2024)   Social Connection and Isolation Panel    Frequency of Communication with Friends and Family: More than three times a week    Frequency of Social Gatherings with Friends and Family: More than three times a week    Attends Religious Services: More than 4 times per year    Active Member of [employer] or Organizations: Yes    Attends Engineer, structural: More than 4 times per year    Marital Status: Married  Catering manager Violence: Not At Risk (06/10/2024)   Humiliation, Afraid, Rape, and Kick questionnaire    Fear of Current or Ex-Partner: No    Emotionally Abused: No    Physically Abused: No    Sexually Abused: No    Outpatient Medications Prior to Visit  Medication Sig Dispense Refill   acetaminophen  (TYLENOL ) 500 MG tablet Take 1,000 mg by mouth every 6 (six) hours as needed for mild pain or moderate pain.     acyclovir  (ZOVIRAX ) 400 MG tablet TAKE 1 TABLET BY MOUTH TWICE A DAY 180 tablet 2   Cholecalciferol (VITAMIN D ) 2000  units CAPS Take 2,000 Units by mouth every other day.     DULoxetine  (CYMBALTA ) 60 MG capsule TAKE 1 CAPSULE BY MOUTH EVERY DAY 90 capsule 2   ferrous sulfate 325 (65 FE) MG EC tablet Take 325 mg by mouth daily.     lenalidomide  (REVLIMID ) 15 MG capsule TAKE 1 CAPSULE BY MOUTH 1 TIME A DAY FOR 21 DAYS ON THEN 7 DAYS OFF 21 capsule 0   Multiple Vitamin (MULTIVITAMIN WITH MINERALS) TABS tablet Take 1 tablet by mouth daily with breakfast. Centrum Silver for Men  nitroGLYCERIN  (NITROSTAT ) 0.4 MG SL tablet Place 1 tablet (0.4 mg total) under the tongue every 5 (five) minutes x 3 doses as needed for chest pain (If no relief after 3rd dose, call or go to ED.). 30 tablet 0   pantoprazole  (PROTONIX ) 40 MG tablet TAKE 1 TABLET BY MOUTH EVERY OTHER DAY 45 tablet 1   potassium chloride  (KLOR-CON ) 10 MEQ tablet Take 1 tablet (10 mEq total) by mouth daily. 90 tablet 1   RESTASIS 0.05 % ophthalmic emulsion Place 1 drop into both eyes 2 (two) times daily.     rosuvastatin  (CRESTOR ) 5 MG tablet TAKE 1 TABLET (5 MG TOTAL) BY MOUTH DAILY. 90 tablet 3   No facility-administered medications prior to visit.    Allergies  Allergen Reactions   Sulfa Antibiotics Other (See Comments)    Unknown reaction. Childhood reaction    Review of Systems  Constitutional:  Negative for chills and fever.  HENT:  Negative for congestion and sore throat.   Eyes:  Negative for pain and discharge.  Respiratory:  Positive for cough, shortness of breath (Mild) and wheezing.   Cardiovascular:  Negative for chest pain and palpitations.  Gastrointestinal:  Positive for constipation. Negative for diarrhea, nausea and vomiting.  Endocrine: Negative for polydipsia and polyuria.  Genitourinary:  Negative for dysuria and hematuria.  Musculoskeletal:  Positive for back pain. Negative for neck pain and neck stiffness.  Skin:  Negative for rash.  Neurological:  Positive for numbness. Negative for dizziness, weakness and headaches.   Psychiatric/Behavioral:  Negative for agitation and behavioral problems.        Objective:    Physical Exam Vitals reviewed.  Constitutional:      General: He is not in acute distress.    Appearance: He is not diaphoretic.  HENT:     Head: Normocephalic and atraumatic.     Nose: Nose normal.     Mouth/Throat:     Mouth: Mucous membranes are moist.  Eyes:     General: No scleral icterus.    Extraocular Movements: Extraocular movements intact.  Cardiovascular:     Rate and Rhythm: Normal rate and regular rhythm.     Heart sounds: Normal heart sounds. No murmur heard. Pulmonary:     Breath sounds: Wheezing (Mild, diffuse bilaterally) present. No rales.  Musculoskeletal:     Cervical back: Neck supple. No tenderness.     Lumbar back: No tenderness. Decreased range of motion.     Right lower leg: No edema.     Left lower leg: No edema.  Skin:    General: Skin is warm.     Findings: No rash.  Neurological:     General: No focal deficit present.     Mental Status: He is alert and oriented to person, place, and time.     Sensory: Sensory deficit (B/l feet) present.     Motor: No weakness.  Psychiatric:        Mood and Affect: Mood normal.        Behavior: Behavior normal.     BP 98/64   Pulse 82   Ht 6' 2 (1.88 m)   Wt 185 lb (83.9 kg)   SpO2 94%   BMI 23.75 kg/m  Wt Readings from Last 3 Encounters:  09/06/24 185 lb (83.9 kg)  08/25/24 191 lb 3.2 oz (86.7 kg)  08/18/24 191 lb (86.6 kg)        Assessment & Plan:   Problem List Items Addressed This Visit  Respiratory   Acute bacterial bronchitis - Primary   CT chest reviewed Started empiric azithromycin  considering persistent symptoms for the last 1 week Started Medrol  Dosepak Cheratussin as needed for cough Albuterol  as needed for dyspnea or wheezing Advised to use incentive spirometry      Relevant Medications   azithromycin  (ZITHROMAX ) 250 MG tablet   methylPREDNISolone  (MEDROL  DOSEPAK) 4 MG  TBPK tablet   albuterol  (VENTOLIN  HFA) 108 (90 Base) MCG/ACT inhaler   guaiFENesin -codeine  100-10 MG/5ML syrup     Other   Multiple myeloma (HCC)   On Revlimid  and infusion chemotherapy Followed by oncology and Dr. Fernande Recent CT chest did not show any lytic lesions, his current symptoms are likely due to acute bronchitis      Relevant Medications   azithromycin  (ZITHROMAX ) 250 MG tablet   methylPREDNISolone  (MEDROL  DOSEPAK) 4 MG TBPK tablet     Meds ordered this encounter  Medications   azithromycin  (ZITHROMAX ) 250 MG tablet    Sig: Take 2 tablets on day 1, then 1 tablet daily on days 2 through 5    Dispense:  6 tablet    Refill:  0   methylPREDNISolone  (MEDROL  DOSEPAK) 4 MG TBPK tablet    Sig: Take as package instructions.    Dispense:  1 each    Refill:  0   albuterol  (VENTOLIN  HFA) 108 (90 Base) MCG/ACT inhaler    Sig: Inhale 2 puffs into the lungs every 6 (six) hours as needed for wheezing or shortness of breath.    Dispense:  18 g    Refill:  0    Okay to substitute to generic/formulary Albuterol .   guaiFENesin -codeine  100-10 MG/5ML syrup    Sig: Take 5 mLs by mouth 3 (three) times daily as needed for cough.    Dispense:  120 mL    Refill:  0     Findley Blankenbaker MARLA Blanch, MD

## 2024-09-06 NOTE — Patient Instructions (Signed)
 Please start taking Azithromycin  as prescribed.  Please start taking Prednisone  as prescribed - tapering dose.  Please take cough syrup as needed for cough - may cause drowsiness.  Please take Albuterol  as needed for shortness of breath or wheezing.

## 2024-09-07 ENCOUNTER — Other Ambulatory Visit: Payer: Self-pay | Admitting: Nurse Practitioner

## 2024-09-07 ENCOUNTER — Other Ambulatory Visit: Payer: Self-pay

## 2024-09-07 DIAGNOSIS — E876 Hypokalemia: Secondary | ICD-10-CM

## 2024-09-07 NOTE — Assessment & Plan Note (Signed)
 CT chest reviewed Started empiric azithromycin  considering persistent symptoms for the last 1 week Started Medrol  Dosepak Cheratussin as needed for cough Albuterol  as needed for dyspnea or wheezing Advised to use incentive spirometry

## 2024-09-07 NOTE — Assessment & Plan Note (Signed)
 On Revlimid  and infusion chemotherapy Followed by oncology and Dr. Fernande Recent CT chest did not show any lytic lesions, his current symptoms are likely due to acute bronchitis

## 2024-09-09 ENCOUNTER — Other Ambulatory Visit: Payer: Self-pay | Admitting: Oncology

## 2024-09-09 ENCOUNTER — Other Ambulatory Visit: Payer: Self-pay

## 2024-09-09 DIAGNOSIS — C9000 Multiple myeloma not having achieved remission: Secondary | ICD-10-CM

## 2024-09-09 MED ORDER — LENALIDOMIDE 15 MG PO CAPS: ORAL_CAPSULE | ORAL | 0 refills | Status: DC

## 2024-09-15 ENCOUNTER — Other Ambulatory Visit: Payer: Self-pay | Admitting: *Deleted

## 2024-09-15 ENCOUNTER — Inpatient Hospital Stay

## 2024-09-15 ENCOUNTER — Other Ambulatory Visit: Payer: Self-pay | Admitting: Oncology

## 2024-09-15 DIAGNOSIS — Z5112 Encounter for antineoplastic immunotherapy: Secondary | ICD-10-CM | POA: Diagnosis not present

## 2024-09-15 DIAGNOSIS — C9000 Multiple myeloma not having achieved remission: Secondary | ICD-10-CM

## 2024-09-15 DIAGNOSIS — Z7969 Long term (current) use of other immunomodulators and immunosuppressants: Secondary | ICD-10-CM

## 2024-09-15 LAB — CBC WITH DIFFERENTIAL/PLATELET
Abs Immature Granulocytes: 0 K/uL (ref 0.00–0.07)
Basophils Absolute: 0 K/uL (ref 0.0–0.1)
Basophils Relative: 1 %
Eosinophils Absolute: 0.2 K/uL (ref 0.0–0.5)
Eosinophils Relative: 9 %
HCT: 30.6 % — ABNORMAL LOW (ref 39.0–52.0)
Hemoglobin: 10.4 g/dL — ABNORMAL LOW (ref 13.0–17.0)
Immature Granulocytes: 0 %
Lymphocytes Relative: 32 %
Lymphs Abs: 0.6 K/uL — ABNORMAL LOW (ref 0.7–4.0)
MCH: 33 pg (ref 26.0–34.0)
MCHC: 34 g/dL (ref 30.0–36.0)
MCV: 97.1 fL (ref 80.0–100.0)
Monocytes Absolute: 0.2 K/uL (ref 0.1–1.0)
Monocytes Relative: 9 %
Neutro Abs: 0.9 K/uL — ABNORMAL LOW (ref 1.7–7.7)
Neutrophils Relative %: 49 %
Platelets: 129 K/uL — ABNORMAL LOW (ref 150–400)
RBC: 3.15 MIL/uL — ABNORMAL LOW (ref 4.22–5.81)
RDW: 15.7 % — ABNORMAL HIGH (ref 11.5–15.5)
WBC: 1.8 K/uL — ABNORMAL LOW (ref 4.0–10.5)
nRBC: 0 % (ref 0.0–0.2)

## 2024-09-15 LAB — COMPREHENSIVE METABOLIC PANEL WITH GFR
ALT: 23 U/L (ref 0–44)
AST: 18 U/L (ref 15–41)
Albumin: 3.2 g/dL — ABNORMAL LOW (ref 3.5–5.0)
Alkaline Phosphatase: 35 U/L — ABNORMAL LOW (ref 38–126)
Anion gap: 11 (ref 5–15)
BUN: 22 mg/dL (ref 8–23)
CO2: 25 mmol/L (ref 22–32)
Calcium: 8.6 mg/dL — ABNORMAL LOW (ref 8.9–10.3)
Chloride: 103 mmol/L (ref 98–111)
Creatinine, Ser: 1.19 mg/dL (ref 0.61–1.24)
GFR, Estimated: >60 mL/min (ref >60)
Glucose, Bld: 101 mg/dL — ABNORMAL HIGH (ref 70–99)
Potassium: 3.7 mmol/L (ref 3.5–5.1)
Sodium: 139 mmol/L (ref 135–145)
Total Bilirubin: 0.8 mg/dL (ref 0.0–1.2)
Total Protein: 6.1 g/dL — ABNORMAL LOW (ref 6.5–8.1)

## 2024-09-15 MED ORDER — DEXAMETHASONE 20 MG PO TABS: ORAL_TABLET | ORAL | 0 refills | Status: DC

## 2024-09-15 NOTE — Progress Notes (Signed)
 Patients port flushed without difficulty.  Good blood return noted with no bruising or swelling noted at site.  Band aid applied.  VSS with discharge and left in satisfactory condition with no s/s of distress noted.

## 2024-09-15 NOTE — Patient Instructions (Signed)
 [facility] [place] - A DEPT OF [facility]  Discharge Instructions: Thank you for choosing [facility] to provide your oncology and hematology care.  If you have a lab appointment with the Cancer Center - please note that after April 8th, 2024, all labs will be drawn in the cancer center.  You do not have to check in or register with the main entrance as you have in the past but will complete your check-in in the cancer center.  Wear comfortable clothing and clothing appropriate for easy access to any Portacath or PICC line.   We strive to give you quality time with your provider. You may need to reschedule your appointment if you arrive late (15 or more minutes).  Arriving late affects you and other patients whose appointments are after yours.  Also, if you miss three or more appointments without notifying the office, you may be dismissed from the clinic at the provider's discretion.      For prescription refill requests, have your pharmacy contact our office and allow 72 hours for refills to be completed.    Today you received the following chemotherapy and/or immunotherapy agents Port flush      To help prevent nausea and vomiting after your treatment, we encourage you to take your nausea medication as directed.  BELOW ARE SYMPTOMS THAT SHOULD BE REPORTED IMMEDIATELY: *FEVER GREATER THAN 100.4 F (38 C) OR HIGHER *CHILLS OR SWEATING *NAUSEA AND VOMITING THAT IS NOT CONTROLLED WITH YOUR NAUSEA MEDICATION *UNUSUAL SHORTNESS OF BREATH *UNUSUAL BRUISING OR BLEEDING *URINARY PROBLEMS (pain or burning when urinating, or frequent urination) *BOWEL PROBLEMS (unusual diarrhea, constipation, pain near the anus) TENDERNESS IN MOUTH AND THROAT WITH OR WITHOUT PRESENCE OF ULCERS (sore throat, sores in mouth, or a toothache) UNUSUAL RASH, SWELLING OR PAIN  UNUSUAL VAGINAL DISCHARGE OR ITCHING   Items with * indicate a potential emergency and should be followed  up as soon as possible or go to the Emergency Department if any problems should occur.  Please show the CHEMOTHERAPY ALERT CARD or IMMUNOTHERAPY ALERT CARD at check-in to the Emergency Department and triage nurse.  Should you have questions after your visit or need to cancel or reschedule your appointment, please contact [facility] [place] - A DEPT OF [facility] [phone]  and follow the prompts.  Office hours are 8:00 a.m. to 4:30 p.m. Monday - Friday. Please note that voicemails left after 4:00 p.m. may not be returned until the following business day.  We are closed weekends and major holidays. You have access to a nurse at all times for urgent questions. Please call the main number to the clinic [phone] and follow the prompts.  For any non-urgent questions, you may also contact your provider using MyChart. We now offer e-Visits for anyone 86 and older to request care online for non-urgent symptoms. For details visit [email or web address].   Also download the MyChart app! Go to the app store, search "MyChart", open the app, select [place], and log in with your MyChart username and password.

## 2024-09-16 LAB — KAPPA/LAMBDA LIGHT CHAINS
Kappa free light chain: 29.3 mg/L — ABNORMAL HIGH (ref 3.3–19.4)
Kappa, lambda light chain ratio: 3.71 — ABNORMAL HIGH (ref 0.26–1.65)
Lambda free light chains: 7.9 mg/L (ref 5.7–26.3)

## 2024-09-19 ENCOUNTER — Other Ambulatory Visit: Payer: Self-pay | Admitting: Oncology

## 2024-09-19 DIAGNOSIS — C9000 Multiple myeloma not having achieved remission: Secondary | ICD-10-CM

## 2024-09-19 LAB — PROTEIN ELECTROPHORESIS, SERUM
A/G Ratio: 1.3 (ref 0.7–1.7)
Albumin ELP: 3.1 g/dL (ref 2.9–4.4)
Alpha-1-Globulin: 0.2 g/dL (ref 0.0–0.4)
Alpha-2-Globulin: 0.7 g/dL (ref 0.4–1.0)
Beta Globulin: 0.8 g/dL (ref 0.7–1.3)
Gamma Globulin: 0.7 g/dL (ref 0.4–1.8)
Globulin, Total: 2.4 g/dL (ref 2.2–3.9)
M-Spike, %: 0.5 g/dL — ABNORMAL HIGH
Total Protein ELP: 5.5 g/dL — ABNORMAL LOW (ref 6.0–8.5)

## 2024-09-22 ENCOUNTER — Encounter (HOSPITAL_COMMUNITY): Payer: Self-pay | Admitting: Oncology

## 2024-09-22 ENCOUNTER — Inpatient Hospital Stay: Admitting: Oncology

## 2024-09-22 ENCOUNTER — Inpatient Hospital Stay: Attending: Hematology

## 2024-09-22 ENCOUNTER — Inpatient Hospital Stay

## 2024-09-22 ENCOUNTER — Encounter: Payer: Self-pay | Admitting: Oncology

## 2024-09-22 VITALS — BP 140/84 | HR 63 | Temp 98.0°F | Resp 18 | Ht 74.0 in | Wt 189.8 lb

## 2024-09-22 DIAGNOSIS — E876 Hypokalemia: Secondary | ICD-10-CM | POA: Diagnosis not present

## 2024-09-22 DIAGNOSIS — D509 Iron deficiency anemia, unspecified: Secondary | ICD-10-CM

## 2024-09-22 DIAGNOSIS — Z79620 Long term (current) use of immunosuppressive biologic: Secondary | ICD-10-CM | POA: Diagnosis not present

## 2024-09-22 DIAGNOSIS — C61 Malignant neoplasm of prostate: Secondary | ICD-10-CM | POA: Diagnosis not present

## 2024-09-22 DIAGNOSIS — C9000 Multiple myeloma not having achieved remission: Secondary | ICD-10-CM

## 2024-09-22 DIAGNOSIS — Z5111 Encounter for antineoplastic chemotherapy: Secondary | ICD-10-CM | POA: Insufficient documentation

## 2024-09-22 DIAGNOSIS — Z5112 Encounter for antineoplastic immunotherapy: Secondary | ICD-10-CM | POA: Insufficient documentation

## 2024-09-22 DIAGNOSIS — Z79899 Other long term (current) drug therapy: Secondary | ICD-10-CM | POA: Diagnosis not present

## 2024-09-22 MED ORDER — LEUPROLIDE ACETATE (6 MONTH) 45 MG ~~LOC~~ KIT
45.0000 mg | PACK | Freq: Once | SUBCUTANEOUS | Status: DC
  Filled 2024-09-22: qty 45

## 2024-09-22 MED ORDER — POTASSIUM CHLORIDE ER 10 MEQ PO TBCR: 10.0000 meq | EXTENDED_RELEASE_TABLET | Freq: Every day | ORAL | 1 refills | Status: DC

## 2024-09-22 MED ORDER — DIPHENHYDRAMINE HCL 25 MG PO CAPS
50.0000 mg | ORAL_CAPSULE | Freq: Once | ORAL | Status: DC
  Filled 2024-09-22: qty 2

## 2024-09-22 MED ORDER — DEXAMETHASONE 4 MG PO TABS
20.0000 mg | ORAL_TABLET | Freq: Once | ORAL | Status: DC
  Filled 2024-09-22: qty 5

## 2024-09-22 MED ORDER — DENOSUMAB 120 MG/1.7ML ~~LOC~~ SOLN
120.0000 mg | Freq: Once | SUBCUTANEOUS | Status: AC
  Administered 2024-09-22: 120 mg via SUBCUTANEOUS
  Filled 2024-09-22: qty 1.7

## 2024-09-22 MED ORDER — ACETAMINOPHEN 325 MG PO TABS
650.0000 mg | ORAL_TABLET | Freq: Once | ORAL | Status: DC
  Filled 2024-09-22: qty 2

## 2024-09-22 MED ORDER — DARATUMUMAB-HYALURONIDASE-FIHJ 1800-30000 MG-UT/15ML ~~LOC~~ SOLN
1800.0000 mg | Freq: Once | SUBCUTANEOUS | Status: AC
  Administered 2024-09-22: 1800 mg via SUBCUTANEOUS
  Filled 2024-09-22: qty 15

## 2024-09-22 MED ORDER — LEUPROLIDE ACETATE (6 MONTH) 45 MG ~~LOC~~ KIT
45.0000 mg | PACK | Freq: Once | SUBCUTANEOUS | Status: AC
  Administered 2024-09-22: 45 mg via SUBCUTANEOUS

## 2024-09-22 NOTE — Progress Notes (Signed)
 Patient is taking Revlimid  as prescribed. He has not missed any doses and reports no side effects at this time.    Patient has been examined by Dr. Davonna. Vital signs and labs have been reviewed by MD - ANC (0.9), Creatinine, LFTs, hemoglobin, and platelets have been reviewed by M.D. - pt may proceed with treatment.  Primary RN and pharmacy notified.

## 2024-09-22 NOTE — Progress Notes (Signed)
 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 male with metastatic castrate sensitive prostate cancer and IgG kappa multiple myeloma  Assessment and Plan Assessment & Plan Multiple myeloma not having achieved remission IgG kappa multiple myeloma (standard cytogenetics) s/p induction with 8 cycles of D-KRD Patient had stem cells collected but did not undergo stem cell transplant Was on maintenance Revlimid  prior to biochemical recurrence recently Started on daratumumab  and was evaluated for CAR-T by Dr. Fernande at  [facility] and planned for CAR-T infusion on 01/11/2024   - Labs reviewed today: CMP: WNL, CBC: WBC: 1.8, ANC: 900, hemoglobin: 10.4, platelets: 129. - SPEP: M spike: 0.5, kappa free light chain: 29.3, lambda free light chain: 7.9, ratio: 3.71 -Discussed with the patient that multiple myeloma labs are more or less stable and to continue current regimen of daratumumab  and Revlimid . - Patient has stable leukopenia and can consider dose reduction of Revlimid  if worsens - Continue to follow with Dr. Fernande for CAR-T treatment.  Will coordinate care with Dr. Fernande -Plan for medication cessation for washout prior to leukapheresis planned on 11/09/2024:  10/19/24 Last dose of Daratumumab  prior to Leukapheresis  11/02/24 Last dose of Revlimid  and Dexamethasone   Return to clinic for follow-up after leukapheresis for continuation of treatment.  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  Will administer dose today 10/02/2024 - Continue denosumab  injection and monthly - Continue vitamin D  and calcium  supplementation -Continue to follow with urology  Iron deficiency anemia Iron deficiency anemia managed with oral iron. Constipation noted with daily intake. - Continue iron supplementation every other day.  Chronic diarrhea Chronic diarrhea managed with Imodium. - Continue Imodium as needed for diarrhea.  Hypokalemia on potassium supplementation Hypokalemia managed with potassium supplementation. - Continue potassium supplementation at 10 mEq daily.  Cough and shortness of breath Obtain CT chest to rule out daratumumab  induced lung infection.  Findings suggestive of bronchitis Resolved at this time   The patient understands the plans discussed today and is in agreement with them.  He knows to contact our office if he develops concerns prior to his next appointment.  40 minutes of total time was spent for this patient encounter, including preparation,review of records,  face-to-face counseling with the patient and coordination of care, physical exam, and documentation of the encounter.    [name],acting as a Neurosurgeon for Mickiel Dry, MD.,have documented all relevant documentation on the behalf of Mickiel Dry, MD,as directed by  Mickiel Dry, MD while in the presence of Mickiel Dry, MD.  I, Mickiel Dry MD, have reviewed the above documentation for accuracy and completeness, and I agree with the above.    [name]   [facility] [place] - A DEPT OF [facility] [address] [place] [address] Dept: [phone] Dept Fax: [phone]  No orders of the defined types were placed in this  encounter.    ONCOLOGY HISTORY:   I have reviewed his chart and materials related to his cancer extensively and collaborated history with the patient. 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  Pathology: 1/12 cores positive for prostatic adenocarcinoma. Gleason score 3+3=6 involves 10% of Right lateral base core.  Prostate volume 175 cc. - 06/20/2015: PSA 16.66.  PSA density 0.18.  -10/09/2015: PSA 17.16 -12/18/2015: Prostate MRI: No findings specific for macroscopic prostate cancer.  -01/03/2016: Patient evaluated by Dr. Cam [Urology] for robotic prostatectomy and felt not to be a candidate for surgery due to low-volume prostate cancer and low PSA density. Dr. Cam recommended active surveillance.  -01/22/2016: Surveillance TRUS and biopsy.  Pathology: All 12 cores showed benign prostatic tissue. Prostate volume was 190 mL.  -2017-2022: Patient remained under surveillance with TRUS/biopsy and monitoring PSA -11/04/2016: PSA 15.4 -05/15/2017: PSA 17.9 -04/19/2018: PSA 18.4 -08/11/2018: Fusion TRUS and biopsy.  Pathology: 1/12 cores showed small focus of prostatic adenocarcinoma, Gleason score 3+3=6 involves 5% of left apex core with cancer length of 0.09 cm. Prostate volume 220 mL. No significant lesions.  No evidence of transcapsular, ureteral, SV/bony or lymphatic disease.  -02/15/2019: PSA 15.9 -03/20/2020: PSA 15.5 -03/20/2021: PSA 22.4 -09/18/2021: PSA 34.5 -11/27/2021: MRI lumbar spine: Expanded appearance of the sacrum with diffusely abnormal bone marrow signal and contrast enhancement, concerning for metastatic disease. -11/27/2021: CT pelvis: Extreme prostatomegaly. No evidence of pelvic lymphadenopathy or other findings to suggest metastatic disease. -03/13/2022: [facility] PET: Multifocal radiotracer avid prostate cancer skeletal metastasis. Broad lytic lesion within  the sacrum with associated radiotracer activity. Multiple discrete lesions in the thoracic lumbar spine and ribs with minimal CT change. Markedly enlarged prostate gland. Focal activity inferior LEFT aspect the gland is suggestive of prostate adenocarcinoma. No nodal metastasis in the abdomen pelvis. No visceral metastasis. -04/15/2022-11/11/2022: Degarelix  -05/06/2022-06/30/2024: PSA: 27.67-->0.26 -02/18/2023-current: Monthly Xgeva  -03/26/2023-current: Eligard  every 6 months   Diagnosis: IgG kappa multiple myeloma with complex cytogenetics   -02/26/2022: Initial Myeloma labs showed IgG monoclonal protein with kappa light chain specificity. IgG 4,990. FLC ratio 106.24 with elevated kappa light chains at 626.8. M spike 4.3. Gamma globulin 4.6. LDH 160.  -03/20/2022: Bone Marrow Biopsy.  -Pathology: The bone marrow core biopsy demonstrates hypercellular marrow for age (95%) with large and atypical appearing plasma cells arranged in sheets and attenuating the normal hematopoietic elements. By CD138 immunohistochemistry plasma cells comprise approximately 70% of the cellular elements and are kappa-restricted by light chain in situ hybridization.  The plasma cells do not express CD56.  Amyloid deposition is not seen by Congo red or crystal violet stain.  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  -04/15/2022-11/11/2022: 8 cycles of Dara KRD completed -10/2022- Current: Maintenance Revlimid  -11/25/2022: Stem cell collection has been completed at [facility]. Patient decided not to undergo stem cell transplant. -06/30/2024: Daratumumab  added back to regimen for worsening M spike -07/29/2024: Consultation with Dr.McKay and patient is willing to proceed with CART  Current Treatment:  Daratumumab  +   Revlimid   INTERVAL HISTORY:   Discussed the use of AI scribe software for clinical note transcription with the patient, who gave verbal consent to  proceed.  History of Present Illness Luis Stewart is a 73 year old male with multiple myeloma and prostate cancer who presents for follow-up and treatment management.  He is currently undergoing treatment for multiple myeloma with daratumumab , Revlimid , and dexamethasone . He is scheduled to receive his last dose of daratumumab  on October 19, 2024, and stopped taking Revlimid  and dexamethasone  on November 02, 2024 for CAR-T leukapheresis on 11/09/2024. Recent labs from September 15, 2024, showed an unchanged M spike. He has low white blood cell and platelet counts secondary to Revlimid  use.  For prostate cancer, he receives Eligard  injections every six months, with the last dose administered in March 2025. He is due for another dose today.  He has iron deficiency anemia and takes iron supplements every other day, though he sometimes takes them daily, which causes constipation. He also takes vitamin D  and calcium  supplements.  He experiences diarrhea frequently but not daily, sometimes up to twice a day. He manages it with Imodium, which provides some relief. He takes 10 mEq of potassium daily, and his prescription will be refilled as needed.  No new complaints today.    I have reviewed the past medical history, past surgical history, social history and family history with the patient and they are unchanged from previous note.  ALLERGIES:  is allergic to sulfa antibiotics.  MEDICATIONS:  Current Outpatient Medications  Medication Sig Dispense Refill   acetaminophen  (TYLENOL ) 500 MG tablet Take 1,000 mg by mouth every 6 (six) hours as needed for mild pain or moderate pain.     acyclovir  (ZOVIRAX ) 400 MG tablet TAKE 1 TABLET BY MOUTH TWICE A DAY 180 tablet 2   albuterol  (VENTOLIN  HFA) 108 (90 Base) MCG/ACT inhaler Inhale 2 puffs  into the lungs every 6 (six) hours as needed for wheezing or shortness of breath. 18 g 0   Cholecalciferol (VITAMIN D ) 2000 units CAPS Take 2,000 Units by mouth every other day.     dexAMETHasone  20 MG TABS Take 1 tablet 30 minutes prior to chemotherapy treatments 10 tablet 0   DULoxetine  (CYMBALTA ) 60 MG capsule TAKE 1 CAPSULE BY MOUTH EVERY DAY 90 capsule 2   ferrous sulfate 325 (65 FE) MG EC tablet Take 325 mg by mouth daily.     guaiFENesin -codeine  100-10 MG/5ML syrup Take 5 mLs by mouth 3 (three) times daily as needed for cough. 120 mL 0   lenalidomide  (REVLIMID ) 15 MG capsule TAKE 1 CAPSULE BY MOUTH 1 TIME A DAY FOR 21 DAYS ON THEN 7 DAYS OFF 21 capsule 0   Multiple Vitamin (MULTIVITAMIN WITH MINERALS) TABS tablet Take 1 tablet by mouth daily with breakfast. Centrum Silver for Men     nitroGLYCERIN  (NITROSTAT ) 0.4 MG SL tablet Place 1 tablet (0.4 mg total) under the tongue every 5 (five) minutes x 3 doses as needed for chest pain (If no relief after 3rd dose, call or go to ED.). 30 tablet 0   pantoprazole  (PROTONIX ) 40 MG tablet TAKE 1 TABLET BY MOUTH EVERY OTHER DAY 45 tablet 1   potassium chloride  (KLOR-CON ) 10 MEQ tablet Take 1 tablet (10 mEq total) by mouth daily. 90 tablet 1   RESTASIS 0.05 % ophthalmic emulsion Place 1 drop into both eyes 2 (two) times daily.     rosuvastatin  (CRESTOR ) 5 MG tablet TAKE 1 TABLET (5 MG TOTAL) BY MOUTH DAILY. 90 tablet 3   No current facility-administered medications for this visit.    REVIEW OF SYSTEMS:   Constitutional: Denies fevers, chills  or abnormal weight loss Eyes: Denies blurriness of vision Ears, nose, mouth, throat, and face: Denies mucositis or sore throat Respiratory: Denies cough, dyspnea or wheezes Cardiovascular: Denies palpitation, chest discomfort or lower extremity swelling Gastrointestinal:  Denies nausea, heartburn or change in bowel habits Skin: Denies abnormal skin rashes Lymphatics: Denies new lymphadenopathy or easy  bruising Neurological:Denies numbness, tingling or new weaknesses Behavioral/Psych: Mood is stable, no new changes  All other systems were reviewed with the patient and are negative.   VITALS:  There were no vitals taken for this visit.  Wt Readings from Last 3 Encounters:  09/06/24 185 lb (83.9 kg)  08/25/24 191 lb 3.2 oz (86.7 kg)  08/18/24 191 lb (86.6 kg)    There is no height or weight on file to calculate BMI.  Performance status (ECOG): 1 - Symptomatic but completely ambulatory  PHYSICAL EXAM:   GENERAL:alert, no distress and comfortable SKIN: skin color, texture, turgor are normal, no rashes or significant lesions LYMPH:  no palpable lymphadenopathy in the cervical, axillary or inguinal LUNGS: clear to auscultation and percussion with normal breathing effort HEART: regular rate & rhythm and no murmurs and no lower extremity edema ABDOMEN:abdomen soft, non-tender and normal bowel sounds Musculoskeletal:no cyanosis of digits and no clubbing  NEURO: alert & oriented x 3 with fluent speech, no focal motor/sensory deficits  LABORATORY DATA:  I have reviewed the data as listed Lab Results  Component Value Date   WBC 1.8 (L) 09/15/2024   NEUTROABS 0.9 (L) 09/15/2024   HGB 10.4 (L) 09/15/2024   HCT 30.6 (L) 09/15/2024   MCV 97.1 09/15/2024   PLT 129 (L) 09/15/2024      Chemistry      Component Value Date/Time   NA 139 09/15/2024 1444   NA 138 02/06/2022 0940   K 3.7 09/15/2024 1444   CL 103 09/15/2024 1444   CO2 25 09/15/2024 1444   BUN 22 09/15/2024 1444   BUN 18 02/06/2022 0940   CREATININE 1.19 09/15/2024 1444   CREATININE 1.23 11/22/2021 1309      Component Value Date/Time   CALCIUM  8.6 (L) 09/15/2024 1444   ALKPHOS 35 (L) 09/15/2024 1444   AST 18 09/15/2024 1444   ALT 23 09/15/2024 1444   BILITOT 0.8 09/15/2024 1444   BILITOT 0.3 02/06/2022 0940      Latest Reference Range & Units 09/15/24 14:44  Total Protein ELP 6.0 - 8.5 g/dL 5.5 (L)  Albumin  ELP 2.9 - 4.4 g/dL 3.1  Globulin, Total 2.2 - 3.9 g/dL 2.4 (C)  A/G Ratio 0.7 - 1.7  1.3 (C)  Alpha-1-Globulin 0.0 - 0.4 g/dL 0.2  Joeyj-7-Honalopw 0.4 - 1.0 g/dL 0.7  Beta Globulin 0.7 - 1.3 g/dL 0.8  Gamma Globulin 0.4 - 1.8 g/dL 0.7  M-SPIKE, % Not Observed g/dL 0.5 (H)  (L): Data is abnormally low (H): Data is abnormally high (C): Corrected   Latest Reference Range & Units 09/15/24 14:44  Kappa free light chain 3.3 - 19.4 mg/L 29.3 (H)  Lambda free light chains 5.7 - 26.3 mg/L 7.9  Kappa, lambda light chain ratio 0.26 - 1.65  3.71 (H)  (H): Data is abnormally high:  RADIOGRAPHIC STUDIES: I have personally reviewed the radiological images as listed and agreed with the findings in the report.  CT Chest W Contrast CLINICAL DATA:  Productive cough. Shortness of breath. Multiple myeloma. Daratumumab  therapy.  * Tracking Code: BO *  EXAM: CT CHEST WITH CONTRAST  TECHNIQUE: Multidetector CT imaging of the  chest was performed during intravenous contrast administration.  RADIATION DOSE REDUCTION: This exam was performed according to the departmental dose-optimization program which includes automated exposure control, adjustment of the mA and/or kV according to patient size and/or use of iterative reconstruction technique.  CONTRAST:  80mL OMNIPAQUE  IOHEXOL  300 MG/ML  SOLN  COMPARISON:  PET-CT 06/16/2025  FINDINGS: Cardiovascular: Left Port-A-Cath tip: SVC.  Mild atheromatous vascular calcifications of the aortic arch.  Mediastinum/Nodes: Unremarkable  Lungs/Pleura: Airway thickening is present, suggesting bronchitis or reactive airways disease. Mild scarring in the posterior basal segment right lower lobe. Small amount of dependent mucus in the trachea and left mainstem bronchus. Minimal scarring inferiorly in the right middle lobe. No significant abnormal ground-glass opacities.  Upper Abdomen: Numerous scattered hepatic cysts as on prior exams.  Musculoskeletal:  No lytic lesions in the thorax characteristic of myeloma.  IMPRESSION: 1. Airway thickening is present, suggesting bronchitis or reactive airways disease. 2. Small amount of dependent mucus in the trachea and left mainstem bronchus. 3. Mild scarring in the posterior basal segment right lower lobe and inferiorly in the right middle lobe. 4. Numerous scattered hepatic cysts as on prior exams. 5. No lytic lesions in the thorax characteristic of myeloma. 6. No findings of ground-glass opacities or characteristic appearance for Daratumumab -induced interstitial lung disease. 7.  Aortic Atherosclerosis (ICD10-I70.0).  Electronically Signed   By: Ryan Salvage M.D.   On: 09/05/2024 15:59

## 2024-09-22 NOTE — Patient Instructions (Signed)
 [facility] - A DEPT OF [place].  [facility]  Discharge Instructions: Thank you for choosing [facility] to provide your oncology and hematology care.  If you have a lab appointment with the Cancer Center - please note that after April 8th, 2024, all labs will be drawn in the cancer center.  You do not have to check in or register with the main entrance as you have in the past but will complete your check-in in the cancer center.  Wear comfortable clothing and clothing appropriate for easy access to any Portacath or PICC line.   We strive to give you quality time with your provider. You may need to reschedule your appointment if you arrive late (15 or more minutes).  Arriving late affects you and other patients whose appointments are after yours.  Also, if you miss three or more appointments without notifying the office, you may be dismissed from the clinic at the provider's discretion.      For prescription refill requests, have your pharmacy contact our office and allow 72 hours for refills to be completed.    Today you received the following chemotherapy and/or immunotherapy agents Xgeva  and Eligard  injection. Leuprolide  Suspension for Injection (Prostate Cancer) What is this medication? LEUPROLIDE  (loo PROE lide) reduces the symptoms of prostate cancer. It works by decreasing levels of the hormone testosterone  in the body. This prevents prostate cancer cells from spreading or growing. This medicine may be used for other purposes; ask your health care provider or pharmacist if you have questions. COMMON BRAND NAME(S): Eligard , Lupron  Depot, Lutrate Depot  What should I tell my care team before I take this medication? They need to know if you have any of these conditions: Diabetes Heart disease Heart failure High or low levels of electrolytes, such as magnesium , potassium, or sodium in your blood Irregular heartbeat or rhythm Seizures An unusual or  allergic reaction to leuprolide , other medications, foods, dyes, or preservatives Pregnant or trying to get pregnant Breast-feeding How should I use this medication? This medication is injected under the skin or into a muscle. It is given by your care team in a hospital or clinic setting. Talk to your care team about the use of this medication in children. Special care may be needed. Overdosage: If you think you have taken too much of this medicine contact a poison control center or emergency room at once. NOTE: This medicine is only for you. Do not share this medicine with others. What if I miss a dose? Keep appointments for follow-up doses. It is important not to miss your dose. Call your care team if you are unable to keep an appointment. What may interact with this medication? Do not take this medication with any of the following: Cisapride Dronedarone Ketoconazole Levoketoconazole Pimozide Thioridazine This medication may also interact with the following: Other medications that cause heart rhythm changes This list may not describe all possible interactions. Give your health care provider a list of all the medicines, herbs, non-prescription drugs, or dietary supplements you use. Also tell them if you smoke, drink alcohol, or use illegal drugs. Some items may interact with your medicine. What should I watch for while using this medication? Your condition will be monitored carefully while you are receiving this medication. Tell your care team if your symptoms do not start to get better or if they get worse. Heart attacks and strokes have been reported with the use of this medication. Get emergency help if  you develop signs or symptoms of a heart attack or stroke. Talk to your care team about the risks and benefits of this medication. This medication may cause serious skin reactions. They can happen weeks to months after starting the medication. Talk to your care team right away if you have  fevers or flu-like symptoms with a rash. The rash may be red or purple and then turn into blisters or peeling of the skin. Or you might notice a red rash with swelling of the face, lips, or lymph nodes in your neck or under your arms. This medication may increase blood sugar. The risk may be higher in patients who already have diabetes. Ask your care team what you can do to lower your risk of diabetes while taking this medication. This medication may cause infertility. Talk to your care team if you are concerned about your fertility. What side effects may I notice from receiving this medication? Side effects that you should report to your care team as soon as possible: Allergic reactions--skin rash, itching, hives, swelling of the face, lips, tongue, or throat Heart attack--pain or tightness in the chest, shoulders, arms, or jaw, nausea, shortness of breath, cold or clammy skin, feeling faint or lightheaded Heart rhythm changes--fast or irregular heartbeat, dizziness, feeling faint or lightheaded, chest pain, trouble breathing High blood sugar (hyperglycemia)--increased thirst or amount of urine, unusual weakness or fatigue, blurry vision New or worsening seizures Redness, blistering, peeling, or loosening of the skin, including inside the mouth Stroke--sudden numbness or weakness of the face, arm, or leg, trouble speaking, confusion, trouble walking, loss of balance or coordination, dizziness, severe headache, change in vision Swelling and pain of the tumor site or lymph nodes Side effects that usually do not require medical attention (report these to your care team if they continue or are bothersome): Change in sex drive or performance Hot flashes Joint pain Pain, redness, or irritation at injection site Swelling of the ankles, hands, or feet Unusual weakness or fatigue This list may not describe all possible side effects. Call your doctor for medical advice about side effects. You may report  side effects to FDA at 1-800-FDA-1088. Where should I keep my medication? This medication is given in a hospital or clinic. It will not be stored at home. NOTE: This sheet is a summary. It may not cover all possible information. If you have questions about this medicine, talk to your doctor, pharmacist, or health care provider.  2025 Elsevier/Gold Standard (2024-03-07 00:00:00)Denosumab  Injection (Oncology) What is this medication? DENOSUMAB  (den oh SUE mab) prevents weakened bones caused by cancer. It may also be used to treat noncancerous bone tumors that cannot be removed by surgery. It can also be used to treat high calcium  levels in the blood caused by cancer. It works by blocking a protein that causes bones to break down quickly. This slows down the release of calcium  from bones, which lowers calcium  levels in your blood. It also makes your bones stronger and less likely to break (fracture). This medicine may be used for other purposes; ask your health care provider or pharmacist if you have questions. COMMON BRAND NAME(S): XGEVA  What should I tell my care team before I take this medication? They need to know if you have any of these conditions: Dental disease Having surgery or tooth extraction Infection Kidney disease Low levels of calcium  or vitamin D  in the blood Malnutrition On hemodialysis Skin conditions or sensitivity Thyroid  or parathyroid disease An unusual reaction to denosumab , other  medications, foods, dyes, or preservatives Pregnant or trying to get pregnant Breast-feeding How should I use this medication? This medication is for injection under the skin. It is given by your care team in a hospital or clinic setting. A special MedGuide will be given to you before each treatment. Be sure to read this information carefully each time. Talk to your care team about the use of this medication in children. While it may be prescribed for children as young as 13 years for selected  conditions, precautions do apply. Overdosage: If you think you have taken too much of this medicine contact a poison control center or emergency room at once. NOTE: This medicine is only for you. Do not share this medicine with others. What if I miss a dose? Keep appointments for follow-up doses. It is important not to miss your dose. Call your care team if you are unable to keep an appointment. What may interact with this medication? Do not take this medication with any of the following: Other medications containing denosumab  This medication may also interact with the following: Medications that lower your chance of fighting infection Steroid medications, such as prednisone  or cortisone This list may not describe all possible interactions. Give your health care provider a list of all the medicines, herbs, non-prescription drugs, or dietary supplements you use. Also tell them if you smoke, drink alcohol, or use illegal drugs. Some items may interact with your medicine. What should I watch for while using this medication? Your condition will be monitored carefully while you are receiving this medication. You may need blood work while taking this medication. This medication may increase your risk of getting an infection. Call your care team for advice if you get a fever, chills, sore throat, or other symptoms of a cold or flu. Do not treat yourself. Try to avoid being around people who are sick. You should make sure you get enough calcium  and vitamin D  while you are taking this medication, unless your care team tells you not to. Discuss the foods you eat and the vitamins you take with your care team. Some people who take this medication have severe bone, joint, or muscle pain. This medication may also increase your risk for jaw problems or a broken thigh bone. Tell your care team right away if you have severe pain in your jaw, bones, joints, or muscles. Tell your care team if you have any pain that  does not go away or that gets worse. Talk to your care team if you may be pregnant. Serious birth defects can occur if you take this medication during pregnancy and for 5 months after the last dose. You will need a negative pregnancy test before starting this medication. Contraception is recommended while taking this medication and for 5 months after the last dose. Your care team can help you find the option that works for you. What side effects may I notice from receiving this medication? Side effects that you should report to your care team as soon as possible: Allergic reactions--skin rash, itching, hives, swelling of the face, lips, tongue, or throat Bone, joint, or muscle pain Low calcium  level--muscle pain or cramps, confusion, tingling, or numbness in the hands or feet Osteonecrosis of the jaw--pain, swelling, or redness in the mouth, numbness of the jaw, poor healing after dental work, unusual discharge from the mouth, visible bones in the mouth Side effects that usually do not require medical attention (report to your care team if they continue or are  bothersome): Cough Diarrhea Fatigue Headache Nausea This list may not describe all possible side effects. Call your doctor for medical advice about side effects. You may report side effects to FDA at 1-800-FDA-1088. Where should I keep my medication? This medication is given in a hospital or clinic. It will not be stored at home. NOTE: This sheet is a summary. It may not cover all possible information. If you have questions about this medicine, talk to your doctor, pharmacist, or health care provider.  2024 Elsevier/Gold Standard (2022-04-30 00:00:00)      To help prevent nausea and vomiting after your treatment, we encourage you to take your nausea medication as directed.  BELOW ARE SYMPTOMS THAT SHOULD BE REPORTED IMMEDIATELY: *FEVER GREATER THAN 100.4 F (38 C) OR HIGHER *CHILLS OR SWEATING *NAUSEA AND VOMITING THAT IS NOT  CONTROLLED WITH YOUR NAUSEA MEDICATION *UNUSUAL SHORTNESS OF BREATH *UNUSUAL BRUISING OR BLEEDING *URINARY PROBLEMS (pain or burning when urinating, or frequent urination) *BOWEL PROBLEMS (unusual diarrhea, constipation, pain near the anus) TENDERNESS IN MOUTH AND THROAT WITH OR WITHOUT PRESENCE OF ULCERS (sore throat, sores in mouth, or a toothache) UNUSUAL RASH, SWELLING OR PAIN  UNUSUAL VAGINAL DISCHARGE OR ITCHING   Items with * indicate a potential emergency and should be followed up as soon as possible or go to the Emergency Department if any problems should occur.  Please show the CHEMOTHERAPY ALERT CARD or IMMUNOTHERAPY ALERT CARD at check-in to the Emergency Department and triage nurse.  Should you have questions after your visit or need to cancel or reschedule your appointment, please contact [facility] [place] - A DEPT OF [facility] [phone]  and follow the prompts.  Office hours are 8:00 a.m. to 4:30 p.m. Monday - Friday. Please note that voicemails left after 4:00 p.m. may not be returned until the following business day.  We are closed weekends and major holidays. You have access to a nurse at all times for urgent questions. Please call the main number to the clinic [phone] and follow the prompts.  For any non-urgent questions, you may also contact your provider using MyChart. We now offer e-Visits for anyone 17 and older to request care online for non-urgent symptoms. For details visit [email or web address].   Also download the MyChart app! Go to the app store, search MyChart, open the app, select [facility], and log in with your MyChart username and password.

## 2024-09-22 NOTE — Progress Notes (Signed)
 Luis Stewart presents today for injection per the provider's orders.  Xgeva  and Eligard  injection administration without incident; injection site WNL; see MAR for injection details.  Patient tolerated procedure well and without incident.  No questions or complaints noted at this time.   Eligard  and Xgeva  given today per MD orders. Tolerated without adverse affects. Vital signs stable. No complaints at this time. Discharged from clinic ambulatory in stable condition. Alert and oriented x 3. F/U with [facility] as scheduled.

## 2024-09-22 NOTE — Patient Instructions (Signed)
 [facility] [place] - A DEPT OF [facility]  Discharge Instructions: Thank you for choosing [facility] to provide your oncology and hematology care.  If you have a lab appointment with the Cancer Center - please note that after April 8th, 2024, all labs will be drawn in the cancer center.  You do not have to check in or register with the main entrance as you have in the past but will complete your check-in in the cancer center.  Wear comfortable clothing and clothing appropriate for easy access to any Portacath or PICC line.   We strive to give you quality time with your provider. You may need to reschedule your appointment if you arrive late (15 or more minutes).  Arriving late affects you and other patients whose appointments are after yours.  Also, if you miss three or more appointments without notifying the office, you may be dismissed from the clinic at the provider's discretion.      For prescription refill requests, have your pharmacy contact our office and allow 72 hours for refills to be completed.    Today you received the following chemotherapy and/or immunotherapy agents Darzalex Faspro. Daratumumab; Hyaluronidase Injection What is this medication? DARATUMUMAB; HYALURONIDASE (dar a toom ue mab; hye al ur ON i dase) treats multiple myeloma, a type of bone marrow cancer. Daratumumab works by blocking a protein that causes cancer cells to grow and multiply. This helps to slow or stop the spread of cancer cells. Hyaluronidase works by increasing the absorption of other medications in the body to help them work better. This medication may also be used treat amyloidosis, a condition that causes the buildup of a protein (amyloid) in your body. It works by reducing the buildup of this protein, which decreases symptoms. It is a combination medication that contains a monoclonal antibody. This medicine may be used for other purposes; ask your health care  provider or pharmacist if you have questions. COMMON BRAND NAME(S): DARZALEX FASPRO What should I tell my care team before I take this medication? They need to know if you have any of these conditions: Heart disease Infection, such as chickenpox, cold sores, herpes, hepatitis B Lung or breathing disease An unusual or allergic reaction to daratumumab, hyaluronidase, other medications, foods, dyes, or preservatives Pregnant or trying to get pregnant Breast-feeding How should I use this medication? This medication is injected under the skin. It is given by your care team in a hospital or clinic setting. Talk to your care team about the use of this medication in children. Special care may be needed. Overdosage: If you think you have taken too much of this medicine contact a poison control center or emergency room at once. NOTE: This medicine is only for you. Do not share this medicine with others. What if I miss a dose? Keep appointments for follow-up doses. It is important not to miss your dose. Call your care team if you are unable to keep an appointment. What may interact with this medication? Interactions have not been studied. This list may not describe all possible interactions. Give your health care provider a list of all the medicines, herbs, non-prescription drugs, or dietary supplements you use. Also tell them if you smoke, drink alcohol, or use illegal drugs. Some items may interact with your medicine. What should I watch for while using this medication? Your condition will be monitored carefully while you are receiving this medication. This medication can cause serious allergic  reactions. To reduce your risk, your care team may give you other medication to take before receiving this one. Be sure to follow the directions from your care team. This medication can affect the results of blood tests to match your blood type. These changes can last for up to 6 months after the final dose.  Your care team will do blood tests to match your blood type before you start treatment. Tell all of your care team that you are being treated with this medication before receiving a blood transfusion. This medication can affect the results of some tests used to determine treatment response; extra tests may be needed to evaluate response. Talk to your care team if you wish to become pregnant or think you are pregnant. This medication can cause serious birth defects if taken during pregnancy and for 3 months after the last dose. A reliable form of contraception is recommended while taking this medication and for 3 months after the last dose. Talk to your care team about effective forms of contraception. Do not breast-feed while taking this medication. What side effects may I notice from receiving this medication? Side effects that you should report to your care team as soon as possible: Allergic reactions--skin rash, itching, hives, swelling of the face, lips, tongue, or throat Heart rhythm changes--fast or irregular heartbeat, dizziness, feeling faint or lightheaded, chest pain, trouble breathing Infection--fever, chills, cough, sore throat, wounds that don't heal, pain or trouble when passing urine, general feeling of discomfort or being unwell Infusion reactions--chest pain, shortness of breath or trouble breathing, feeling faint or lightheaded Sudden eye pain or change in vision such as blurry vision, seeing halos around lights, vision loss Unusual bruising or bleeding Side effects that usually do not require medical attention (report to your care team if they continue or are bothersome): Constipation Diarrhea Fatigue Nausea Pain, tingling, or numbness in the hands or feet Swelling of the ankles, hands, or feet This list may not describe all possible side effects. Call your doctor for medical advice about side effects. You may report side effects to FDA at 1-800-FDA-1088. Where should I keep my  medication? This medication is given in a hospital or clinic. It will not be stored at home. NOTE: This sheet is a summary. It may not cover all possible information. If you have questions about this medicine, talk to your doctor, pharmacist, or health care provider.  2024 Elsevier/Gold Standard (2022-04-15 00:00:00)      To help prevent nausea and vomiting after your treatment, we encourage you to take your nausea medication as directed.  BELOW ARE SYMPTOMS THAT SHOULD BE REPORTED IMMEDIATELY: *FEVER GREATER THAN 100.4 F (38 C) OR HIGHER *CHILLS OR SWEATING *NAUSEA AND VOMITING THAT IS NOT CONTROLLED WITH YOUR NAUSEA MEDICATION *UNUSUAL SHORTNESS OF BREATH *UNUSUAL BRUISING OR BLEEDING *URINARY PROBLEMS (pain or burning when urinating, or frequent urination) *BOWEL PROBLEMS (unusual diarrhea, constipation, pain near the anus) TENDERNESS IN MOUTH AND THROAT WITH OR WITHOUT PRESENCE OF ULCERS (sore throat, sores in mouth, or a toothache) UNUSUAL RASH, SWELLING OR PAIN  UNUSUAL VAGINAL DISCHARGE OR ITCHING   Items with * indicate a potential emergency and should be followed up as soon as possible or go to the Emergency Department if any problems should occur.  Please show the CHEMOTHERAPY ALERT CARD or IMMUNOTHERAPY ALERT CARD at check-in to the Emergency Department and triage nurse.  Should you have questions after your visit or need to cancel or reschedule your appointment, please contact [facility] CANCER  [facility] [place] - A DEPT OF [facility] [phone]  and follow the prompts.  Office hours are 8:00 a.m. to 4:30 p.m. Monday - Friday. Please note that voicemails left after 4:00 p.m. may not be returned until the following business day.  We are closed weekends and major holidays. You have access to a nurse at all times for urgent questions. Please call the main number to the clinic [phone] and follow the prompts.  For any non-urgent questions, you may also contact your  provider using MyChart. We now offer e-Visits for anyone 18 and older to request care online for non-urgent symptoms. For details visit [email or web address].   Also download the MyChart app! Go to the app store, search "MyChart", open the app, select [facility], and log in with your MyChart username and password.

## 2024-09-22 NOTE — Progress Notes (Signed)
 Patient presents today for Eligard , Darzalex  Faspro and Xgeva . Calcium  8.6. ANC 0.9. Labs reviewed by MD and message received from A.Lenon RN / Dr. Davonna to proceed with treatment.  Patient taking Revlimid  as prescribed and no side effects noted. Patient takes Cholecalciferol (Vitamin D ) 2000 units every other day.   Darzalex  Faspro given today per MD orders. Tolerated without adverse affects. Vital signs stable. No complaints at this time. Discharged from clinic ambulatory in stable condition. Alert and oriented x 3. F/U with [facility] as scheduled.

## 2024-09-22 NOTE — Patient Instructions (Signed)

## 2024-09-22 NOTE — Addendum Note (Signed)
 Addended by: JOSHUA IVANOFF E on: 09/22/2024 12:44 PM   Modules accepted: Orders

## 2024-09-23 LAB — IFE, DARA-SPECIFIC, SERUM
IgA: 18 mg/dL — ABNORMAL LOW (ref 61–437)
IgG (Immunoglobin G), Serum: 778 mg/dL (ref 603–1613)
IgM (Immunoglobulin M), Srm: 13 mg/dL — ABNORMAL LOW (ref 15–143)

## 2024-09-26 DIAGNOSIS — C9000 Multiple myeloma not having achieved remission: Secondary | ICD-10-CM | POA: Diagnosis not present

## 2024-09-26 DIAGNOSIS — Z7189 Other specified counseling: Secondary | ICD-10-CM | POA: Diagnosis not present

## 2024-10-03 ENCOUNTER — Telehealth: Payer: Self-pay

## 2024-10-03 NOTE — Telephone Encounter (Signed)
 Copied from CRM [id number]. Topic: Clinical - Medical Advice >> Oct 03, 2024 12:41 PM Gustabo D wrote: Pt wants to know if yall are doing flu shots call back

## 2024-10-03 NOTE — Telephone Encounter (Signed)
 Pt wife coming on wed to get flu shot would like to be added that day as well.

## 2024-10-05 ENCOUNTER — Ambulatory Visit (INDEPENDENT_AMBULATORY_CARE_PROVIDER_SITE_OTHER)

## 2024-10-05 ENCOUNTER — Encounter: Payer: Self-pay | Admitting: Cardiology

## 2024-10-05 ENCOUNTER — Ambulatory Visit: Attending: Cardiology | Admitting: Cardiology

## 2024-10-05 VITALS — BP 122/64 | HR 55 | Ht 74.0 in | Wt 192.8 lb

## 2024-10-05 DIAGNOSIS — I491 Atrial premature depolarization: Secondary | ICD-10-CM | POA: Diagnosis not present

## 2024-10-05 DIAGNOSIS — Z23 Encounter for immunization: Secondary | ICD-10-CM | POA: Diagnosis not present

## 2024-10-05 DIAGNOSIS — I700 Atherosclerosis of aorta: Secondary | ICD-10-CM | POA: Diagnosis not present

## 2024-10-05 DIAGNOSIS — E782 Mixed hyperlipidemia: Secondary | ICD-10-CM

## 2024-10-05 NOTE — Progress Notes (Signed)
 Patient is in office today for a nurse visit for Immunization. Patient Injection was given in the  Left deltoid. Patient tolerated injection well.

## 2024-10-05 NOTE — Patient Instructions (Addendum)

## 2024-10-05 NOTE — Progress Notes (Signed)
    Cardiology Office Note  Date: 10/05/2024   ID: [name], [name] 19-Oct-1951, MRN [id number]  History of Present Illness: Luis Stewart is a 73 y.o. male last seen in April by Ms. Miriam NP, I reviewed her note.  He is here today with his wife for a follow-up visit.  He does not report any progressive sense of palpitations, has had no sudden dizziness or syncope. He continues to follow with oncology for treatment of multiple myeloma.  I reviewed his cardiac monitor from January.  We went over his medications.  He is currently not on any AV nodal blockers and his resting heart rate is in the 50s to 60s.  He is tolerating Crestor  5 mg daily with LDL down to 57 in February.  Physical Exam: VS:  BP 122/64   Pulse (!) 55   Ht 6' 2 (1.88 m)   Wt 192 lb 12.8 oz (87.5 kg)   SpO2 98%   BMI 24.75 kg/m , BMI Body mass index is 24.75 kg/m.  Wt Readings from Last 3 Encounters:  10/05/24 192 lb 12.8 oz (87.5 kg)  09/22/24 189 lb 12.8 oz (86.1 kg)  09/06/24 185 lb (83.9 kg)    General: Patient appears comfortable at rest. HEENT: Conjunctiva and lids normal. Neck: Supple, no elevated JVP or carotid bruits. Lungs: Clear to auscultation, nonlabored breathing at rest. Cardiac: Regular rate and rhythm with rare ectopic beat, no S3, 1/6 systolic murmur, no pericardial rub. Extremities: No pitting edema.  ECG:  An ECG dated 03/15/2024 was personally reviewed today and demonstrated:  Sinus rhythm with prolonged PR interval and burst of atrial tachycardia, incomplete right bundle branch block.  Labwork: 03/17/2024: TSH 2.381 08/18/2024: Magnesium  1.8 09/15/2024: ALT 23; AST 18; BUN 22; Creatinine, Ser 1.19; Hemoglobin 10.4; Platelets 129; Potassium 3.7; Sodium 139     Component Value Date/Time   CHOL 120 02/18/2024 0927   CHOL 187 06/04/2023 1522   TRIG 44 02/18/2024 0927   HDL 54 02/18/2024 0927   HDL 61 06/04/2023 1522   CHOLHDL 2.2 02/18/2024 0927   VLDL 9  02/18/2024 0927   LDLCALC 57 02/18/2024 0927   LDLCALC 112 (H) 06/04/2023 1522   LDLCALC 95 01/11/2021 0852   Other Studies Reviewed Today:  No interval cardiac testing for review today.  Assessment and Plan:  1.  Aortic atherosclerosis by CT imaging, asymptomatic.  He had a prior coronary calcium  score of 0 in 2019 and reports no symptoms suggestive of angina.  LVEF was normal at 60 to 65% by echocardiogram in 2023.  Tolerating statin therapy with good lipid control.  Would continue with observation for now.   2.  Mixed hyperlipidemia, LDL 57 in February.  Continue Crestor  5 mg daily.  3.  Frequent PACs (10% total beats) and brief episodes of PSVT noted by cardiac monitor in January.  He does not report any progressive sense of palpitations and has had no sudden dizziness or syncope.  Not on AV nodal blockers with resting heart rate in the 50s to 60s.  Sinew observation.  Disposition:  Follow up 6 months.  Signed, Jayson JUDITHANN Sierras, M.D., F.A.C.C. [facility] at [facility]

## 2024-10-10 ENCOUNTER — Other Ambulatory Visit: Payer: Self-pay

## 2024-10-10 ENCOUNTER — Other Ambulatory Visit: Payer: Self-pay | Admitting: Oncology

## 2024-10-10 DIAGNOSIS — C9000 Multiple myeloma not having achieved remission: Secondary | ICD-10-CM

## 2024-10-10 DIAGNOSIS — Z5112 Encounter for antineoplastic immunotherapy: Secondary | ICD-10-CM | POA: Diagnosis not present

## 2024-10-10 DIAGNOSIS — I517 Cardiomegaly: Secondary | ICD-10-CM | POA: Diagnosis not present

## 2024-10-10 DIAGNOSIS — I351 Nonrheumatic aortic (valve) insufficiency: Secondary | ICD-10-CM | POA: Diagnosis not present

## 2024-10-10 MED ORDER — LENALIDOMIDE 15 MG PO CAPS: ORAL_CAPSULE | ORAL | 0 refills | Status: DC

## 2024-10-10 NOTE — Progress Notes (Signed)
 Chart reviewed. Revlimid  refilled per last office note with Dr. Davonna.

## 2024-10-10 NOTE — Progress Notes (Signed)
 Comprehensive Geriatric Assessment Consultation     Reason for Referral: Comprehensive Geriatric Assessment and Recommendations Referring Provider:  Dr. Fernande Diagnosis: Multiple Myeloma    Impression and Plan:  Mr. Tabet is a 73 y.o. year-old who was referred for a comprehensive geriatric assessment, which includes assessments in the following domains: Physical performance/functional status, cognitive function, nutritional status, social support, financial stressors, co-morbidities including polypharmacy. Vulnerabilities in these domains are listed below with recommendations.   Physical performance/Functional Status Patient is independent in ADLs and IADLs. He is active daily but has no regular exercise schedule. He is active around the home and in the yard. His peripheral neuropathy does not inhibit him physically. He does not have chronic pain or arthritis (does have right knee replacement). We discussed the GOKART trial, and patient is interested. Recommendation: No interventions recommended  Cognitive Function Patient denies any changes to his memory or cognition since starting treatment. He scored 25/30 on the MoCA.  Recommendations: I have no clinical concerns for dementia. In anticipation of worsening memory or cognitive difficulties, recommend providing detailed instructions with each visit. If patient is hospitalized, recommend Occupational Therapy consult for delirium prevention strategies.   Nutritional Status Patient endorses initial dysgeusia that has improved over time. No weight loss. Has experienced diarrhea that is likely related to the Revlimid  (improves on his week off) but is easily controlled with a dose of imodium approximately every other day. No nausea/vomiting. No significant weight loss or loss of appetite.  Recommendations: No recommendations.  Co-morbidities, Polypharmacy Include metastatic prostate cancer to the bone, HLD, peripheral neuropathy. His  peripheral neuropathy pre-dates his multiple myeloma diagnosis and treatment has not caused his neuropathy to worsen. He has been taking Cymbalta  for his neuropathy, prescribed by his PCP.  Patient does meet criteria for polypharmacy (>5 medications). Cymbalta  and pantoprazole  meet BEERS criteria for increased risks for side effects (Cymbalta - n/v/d, >8 weeks of treatment with pantoprazole  increases the risk for Cdiff, bone loss, fractures, pneumonia). Recommendations: follow closely with prostate oncologist. Monitor for drug-drug interactions. At this time, benefit outweighs risk of side effect of Cymbalta . Patient's diarrhea started after starting the Revlamid, no correlation with the Cymbalta . Consider H2 blocker alternative to pantoprazole .   Social Support/Financial Stressors None Identified. Patient has adequate support through his wife and children. His wife will be his primary caregiver and they have back ups if needed. He is not paying for more healthcare than he can afford. He has strong faith. Recommendations: No interventions recommended.  Unless otherwise mentioned, no other vulnerabilities were found. Assessment detailed below.   A Comprehensive Geriatric Assessment was performed and an overview of the results is offered below:   Domain GA results Recommendations/ Interventions  Functional Status  ADLs: Independent in 6 of 6.  IADLs: Independent in 8 of 8. No intervention recommended  Physical performance Short Physical Performance Battery score: 12/12  (A score of 9 or < is associated with impairment)   History of falls: 1   Assistive device use: none No intervention recommended.    Cognitive function MOCA score: 25    A score of 18-25 indicates mild cognitive impairment  A score of 10-17 indicates moderate cognitive impairment  A score under 10 indicates severe cognitive impairment   No intervention recommended  Recommendations: Provide detailed written  instructions with each visit. Avoid delirium by preventing dehydration, seeking care for symptoms of infection immediately. Titrate opioids carefully. Consider full neuropsych evaluation if desired. We can facilitate this through our program.  Comorbidity burden  The Cumulative Illness Rating Severity - Geriatrics score revealed moderate comorbidity burden.  Met prostate ca to bone HLD Peripheral neuropathy   Coordinate care closely with prostate cancer oncologist  Monitor neuropathy closely  Nutritional status  Mini Nutritional Assessment reveals the patient is healthy No intervention recommended.   Polypharmacy and potentially inappropriate medications  Formal pharmacist review and BEERS criteria Pharmacist will be reaching out to patient. No Intervention needed.  Social Support CARG social support scale reveals adequate support Increase family support on days 2-5 after chemo.  Consider adding a caregiver for help with chores, meal prep if patient becomes weakened and family isn't able to provide all the support needed.  Depression Geriatric Depression Scale Score: 1 A score of greater than or equal to 5 suggests depression No depression identified.   No intervention currently needed.  Life expectancy E-prognosis: 12 years outside of cancer diagnosis This aids in treatment decision making  Financial Toxicity Patient responded no to question. No intervention needed at this time. Recommend Joshua Katz be contacted  Reference: [email or web address].info/ [email or web address] doi: 10.1182/bloodadvances.7979998280  Oncology History  Multiple myeloma (HCC)  04/09/2022 Initial Diagnosis   Multiple myeloma (HCC)   11/28/2022 - 12/02/2022 Chemotherapy   OP ONC Supportive Filgrastim  (ZARXIO ) / Plerixafor  (MOZOBIL ) for mobilization Plan Provider: Norleen Debby Funk, DO Treatment goal: Supportive Line of treatment: Transplant    01/08/2023 - 01/12/2023 Chemotherapy   OP ONC Supportive Filgrastim  (Zarxio ) / Plerixafor  (MOZOBIL ) for mobilization Plan Provider: Norleen Debby Funk, DO Treatment goal: Control Line of treatment: Transplant   10/10/2024 -  Supportive Treatment   Adult Peripheral IV and Central Venous Access Orders & Alteplase  PRN Plan Provider: Norleen Debby Funk, MD      History of Present Illness   Mr. Halt is a 73 y.o. 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 He was diagnosed with localized prostate cancer in June 2016 on active surveillance until 2022 when back pain led to MRI lumbar spine that noted Expanded appearance of the sacrum with diffusely abnormal bone marrow signal and contrast enhancement, concerning for metastatic disease. PSMA PET early 2023 showed multifocal radiotracer avid prostate cancer skeletal metastasis. Broad lytic lesion within the sacrum with associated radiotracer activity. Multiple discrete lesions in the thoracic lumbar spine and ribs with minimal CT change. Markedly enlarged prostate gland. Focal activity inferior LEFT aspect the gland is suggestive of prostate adenocarcinoma. No nodal metastasis in the abdomen pelvis. No visceral metastasis. He has been on treatment since April 2023. Most recent treatment regimen is Eligard  and denosumab . He was diagnosed with multiple myeloma during his metastatic prostate cancer workup when a high protein level led to myeloma labs showing IgG monoclonal protein with kappa light chain specificity. IgG 4,990. FLC ratio 106.24 with elevated kappa light chains at 626.8. M spike 4.3. Gamma globulin 4.6. LDH 160. He had a bone marrow biopsy on 03/20/2022 that demonstrated hypercellular marrow for age (95%) with large and atypical appearing plasma cells arranged in sheets and attenuating the normal hematopoietic elements. By CD138 immunohistochemistry plasma cells comprise approximately  70% of the cellular elements and are kappa-restricted by light chain in situ hybridization. The plasma cells do not express CD56. Amyloid deposition is not seen by Congo red or crystal violet stain.  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 Daratumumab  was added back to the patient's treatment regimen for worsening M spine in July 2025.   Mr. Bazinet states that overall he feels that he has tolerated treatment well. His biggest side effect from treatment is diarrhea. He states that one tablet of imodium approximately every other day keeps the diarrhea under control. He has noted some muscle cramping after strenuous exercise as well. He does make sure to keep hydrated. He denies nausea/vomiting with treatment. He has underlying peripheral neuropathy of his feet which predates his MM diagnosis. He has not fallen due to the neuropathy. He did initially note some dysgeusia but that has improved and he denies any weight loss due to this. Patient's wife does think that patient is a little bit more fatigued since starting treatment. Patient worked swing shifts for >20 years and states that he does not sleep well at baseline so he does tend to nap but mainly because he is up overnight as well.  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  He denies cognitive changes with treatment so far.   Review of Systems:  A Complete ROS was performed and negative if not mentioned above.   Medical History[1]   Surgical History[2]   Medications Ordered Prior to Encounter[3]   Allergies[4]   Social History   Socioeconomic History  . Marital status: Married     Spouse name: Not on file  . Number of children: Not on file  . Years of education: Not on file  . Highest education level: Not on file  Occupational History  . Not on file  Tobacco Use  . Smoking status: Never  . Smokeless tobacco: Never  Substance and Sexual Activity  . Alcohol use: Not Currently  . Drug use: Never  . Sexual activity: Not Currently  Other Topics Concern  . Not on file  Social History Narrative  . Not on file   Social Drivers of Health   Food Insecurity: No Food Insecurity (06/10/2024)   Received from [facility]   Food vital sign   . Within the past 12 months, you worried that your food would run out before you got money to buy more: Never true   . Within the past 12 months, the food you bought just didn't last and you didn't have money to get more: Never true  Transportation Needs: No Transportation Needs (06/10/2024)   Received from [facility] Transportation   . In the past 12 months, has lack of transportation kept you from medical appointments or from getting medications?: No   . In the past 12 months, has lack of transportation kept you from meetings, work, or from getting things needed for daily living?: No  Safety: Not At Risk (06/10/2024)   Received from [facility]   Safety   . Within the last year, have you been afraid of your partner or ex-partner?: No   . Within the last year, have you been humiliated or emotionally abused in other ways by your partner or ex-partner?: No   . Within the last year, have you been kicked, hit, slapped, or otherwise physically hurt by your partner or ex-partner?: No   . Within the last year, have you been raped or forced to have any kind of  sexual activity by your partner or ex-partner?: No  Living Situation: Low Risk  (06/10/2024)   Received from [facility] Situation   . In the last 12 months, was there a time when you were not able to pay the mortgage or rent on time?: No   . In the past 12  months, how many times have you moved where you were living?: 0   . At any time in the past 12 months, were you homeless or living in a shelter (including now)?: No      Family History[5]   Vitals BP 135/68 (BP Location: Left arm, Patient Position: Sitting)   Pulse 57   Temp 96.2 F (35.7 C) (Temporal)   Ht 1.88 m (6' 2)   Wt 85.9 kg (189 lb 6.4 oz)   SpO2 99%   BMI 24.32 kg/m    Physical Examination GENERAL: Well developed, well nourished, in no acute distress. NEUROLOGIC: Alert and oriented x 3. No focal deficits PSYCH: Pleasant appropriate affect and normal thought content.     MoCA [facility] Cognitive Assessment for Dementia): Patient's total score is Total: 25 A score of 18-25 may be indicative of mild cognitive impairment, 10-17 moderate cognitive impairment, and less than 10 severe cognitive impairment.   MoCA          Score  Visuospatial / 5 Visuospatial /5: 2   Naming / 3 Naming /3: 3   Attention Digits / 2 Attention digits /2: 2  Attention Letters / 1 Attention letters /1: 1   Attention Subtraction / 3 Attention subtraction /3: 3   Language Repeat / 2 Language repeat /2: 2   Language Fluency / 1 Language fluency /1: 1   Abstraction / 2 Abstraction /2: 2   Delayed Recall / 5 Delayed recall /5: 3 (MIS 12/15)   Orientation / 6 Orientation /6: 6      MoCA Total / 30   Adjusted for level of education (add 1 point for less than grade 12) Adjusted for level of education (add 1 point for less than grade 12) : 0   Total Total: 25   Self Physical Assessment:  Activities/ Mobility Activity Not difficult A little difficult Very difficult  Vigorous activities, such as running, lifting heavy objects, participating in strenuous activities  X    Moderate activities, such as moving a table, pushing a vacuum cleaner, bowling, or playing golf   X    Lifting or carrying groceries X    Climbing several flights of stairs X    Climbing one flight of stairs      Bending, kneeling, or stooping X    Walking more than a mile.  X    Walking several blocks X    Walking one block X    Bathing oneself X      Physical Testing:  Short Physical Performance Battery (SPPB): Balance testing Side by side 10sec; semi-tandem 10sec; full tandem 10sec  Total balance score = 4 (range 0-4)   5 repeat chair stands 11.18sec     Score: 4  4 meter walk Trial 1: 4.76sec  Trial 2: 5.24sec   Gait speed score: 4  Total SPPB score 12  (range 0-12, higher scores indicate better function, <9 indicates physical frailty)      Mini Nutrition Assessment: Food intake declined in past three months: No, 2pt Weight loss during past three months:  No, 3pt Able to get out of bed/ chair and goes out: Yes,  2pt Suffered psychological distress or acute disease in past 3 months: Yes, 0pt Neuropsychological problems: No, 2pt BMI :24.3, 3pt Score: 12 [Screening Scores 0-7: Malnourished 8-11: At risk of malnutrition 12-14: Normal nutritional status]  Social Support:  Someone to help you if you were confined to bed: All of the time Someone to take you to the doctor if needed: All of the time Someone to prepare your meals if you are unable to do it yourself: All of the time Someone to help you with daily chores if you were sick: All of the time Someone to have a good time with: Most of the time Someone to turn to for suggestions about a personal problem: Most of the time Someone who understands your problems: Most of the time Someone to love and make you feel wanted: All of the time  Laboratory Review Flowsheet  More data exists      Latest Ref Rng & Units 10/10/2024 01/13/2023 01/12/2023 01/09/2023 12/04/2022 12/03/2022 11/28/2022  Common Labs  Hemoglobin 14.0 - 17.5 g/dL 89.8*  89.6*  88.9*  89.8*  10.5*  9.8*  11.5*  10.9*   Hematocrit 41.5 - 50.4 % 29.2*  30.9*  33.1*  29.7*  30.6*  29.3*  33.3*  32.0*   Platelet Count (Plt) 150 - 450 10*3/uL 85*  135*  263  243  79*   76*  151  198   WBC 4.40 - 11.00 10*3/uL 1.30*  47.7*  44.4*  27.3*  32.7*  31.5*  27.1*  3.6*   Sodium 136 - 145 mmol/L 139  140  143  141  144  141  143   Potassium 3.4 - 4.5 mmol/L 3.9  4.0  3.9  3.9  3.9  4.0  4.0   Glucose 70 - 99 mg/dL 87  873*  895*  91  90  103*  92   Magnesium  1.9 - 2.7 mg/dL 1.8*  1.5*  1.7*  1.9  1.4*  1.6*  -  Blood Urea  Nitrogen 7 - 25 mg/dL 13  16  11  15  17  19  17    Creatinine 0.70 - 1.30 mg/dL 8.87  8.75  8.90  8.82  1.25  1.17  1.06   Uric Acid 4.4 - 7.6 mg/dL 4.1*  - - - - - -  Calcium  Level Total 8.6 - 10.3 mg/dL 8.8  9.5  9.1  8.2*  8.7  9.0  8.1*   Albumin 3.5 - 5.7 g/dL 3.8  3.7  3.8  3.5  3.3*  3.5  4.0  3.9   Aspartate Aminotransferase (AST) 13 - 39 U/L 20  26  20  13  22  25  21    Alanine Aminotransferase 7 - 52 U/L 19  20  19  13  26  29  20    Bilirubin Total 0.3 - 1.0 mg/dL 0.7  0.4  0.5  0.4  0.6  0.6  0.5   INR <5.0 1.5  - - - - - 1.09   LDH 140 - 271 U/L 135*  - - - - - -  Total Protein 6.4 - 8.9 g/dL 6.4 - 8.9 g/dL 5.7*  5.9*  5.5*  5.6*  5.2*  5.0*  5.2*  5.7*  5.6*     Details       Multiple values from one day are sorted in reverse-chronological order          #Clinical Trial Screening: Discussed GOKART trial.  Thank you for referring this patient for comprehensive geriatric assessment.   I spent 60 minutes with patient; more than 50% of the time was spent taking history, performing pertinant exams and counseling patient   Sheena Coventry, PA-C Geriatric Oncology Consult Clinic [facility]        [1] Past Medical History: Diagnosis Date  . Arthritis   . Cancer    (CMD) 02/26/2022   Multiple Myeloma  . GERD (gastroesophageal reflux disease) 10/15/2017   Last Assessment & Plan:  Formatting of this note might be different from the original. On Pantoprazole  40 mg QOD  . Multiple myeloma 04/09/2022  . Neuromuscular disorder   . Prostate cancer    (CMD) 08/17/2015   Last Assessment & Plan:   Formatting of this note might be different from the original. Has had prostate biopsy Elevated PSA Follows up with Urologist - under PSA surveillance Did not tolerate Finasteride  Referred to Oncology for concern of pelvic mass  . Pulmonary nodule 07/19/2017  . Vitamin D  deficiency 06/05/2014   Last Assessment & Plan:  Formatting of this note might be different from the original. ERGOCALCIFEROL   X 12 WEEKS  [2] Past Surgical History: Procedure Laterality Date  . COLONOSCOPY  10/05/2023  . [facility]  03/2022  . UPPER GASTROINTESTINAL ENDOSCOPY  2018   2018  [3] Current Outpatient Medications on File Prior to Visit  Medication Sig Dispense Refill  . acetaminophen  (TYLENOL ) 500 mg tablet Take 1,000 mg by mouth.    . acyclovir  (ZOVIRAX ) 400 mg tablet Take 400 mg by mouth.    . cholecalciferol (VITAMIN D3) 1,000 unit (25 mcg) tablet Take 1,000 Units by mouth daily.    . DULoxetine  (CYMBALTA ) 60 mg capsule Take 60 mg by mouth daily.    . ferrous sulfate 325 mg (65 mg iron) EC tablet Take 325 mg by mouth daily.    . lenalidomide  (REVLIMID ) 15 mg cap Take 15 mg by mouth.    . multivitamin (Flintstones/Extra C) chewable tablet Chew.    . nitroglycerin  (NITROSTAT ) 0.4 mg SL tablet Place 0.4 mg under the tongue.    . pantoprazole  (PROTONIX ) 40 mg EC tablet Take 40 mg by mouth.    . potassium chloride  (KLOR-CON ) 10 mEq ER tablet Take 10 mEq by mouth daily.    . prochlorperazine  (COMPAZINE ) 10 mg tablet Take 10 mg by mouth.    . Restasis 0.05 % ophthalmic emulsion Administer 1 drop into both eyes 2 (two) times a day.    . rosuvastatin  (CRESTOR ) 5 mg tablet Take 5 mg by mouth daily.    . [DISCONTINUED] tamsulosin  (FLOMAX ) 0.4 mg cap Take 0.4 mg by mouth. (Patient not taking: Reported on 10/10/2024)     No current facility-administered medications on file prior to visit.  [4] Allergies Allergen Reactions  . Sulfa (Sulfonamide Antibiotics) Other (See Comments)    Unknown reaction. Childhood reaction   [5] No family history on file. *Some images could not be shown.

## 2024-10-11 DIAGNOSIS — I498 Other specified cardiac arrhythmias: Secondary | ICD-10-CM | POA: Diagnosis not present

## 2024-10-12 DIAGNOSIS — H5203 Hypermetropia, bilateral: Secondary | ICD-10-CM | POA: Diagnosis not present

## 2024-10-16 DIAGNOSIS — C9000 Multiple myeloma not having achieved remission: Secondary | ICD-10-CM | POA: Diagnosis not present

## 2024-10-16 DIAGNOSIS — Z5112 Encounter for antineoplastic immunotherapy: Secondary | ICD-10-CM | POA: Diagnosis not present

## 2024-10-18 DIAGNOSIS — Z0189 Encounter for other specified special examinations: Secondary | ICD-10-CM | POA: Diagnosis not present

## 2024-10-18 DIAGNOSIS — C9000 Multiple myeloma not having achieved remission: Secondary | ICD-10-CM | POA: Diagnosis not present

## 2024-10-18 DIAGNOSIS — Z5112 Encounter for antineoplastic immunotherapy: Secondary | ICD-10-CM | POA: Diagnosis not present

## 2024-10-19 ENCOUNTER — Inpatient Hospital Stay

## 2024-10-19 DIAGNOSIS — C9000 Multiple myeloma not having achieved remission: Secondary | ICD-10-CM

## 2024-10-19 DIAGNOSIS — Z5111 Encounter for antineoplastic chemotherapy: Secondary | ICD-10-CM | POA: Diagnosis not present

## 2024-10-19 DIAGNOSIS — C61 Malignant neoplasm of prostate: Secondary | ICD-10-CM

## 2024-10-19 LAB — CBC WITH DIFFERENTIAL/PLATELET
Abs Immature Granulocytes: 0 K/uL (ref 0.00–0.07)
Basophils Absolute: 0 K/uL (ref 0.0–0.1)
Basophils Relative: 1 %
Eosinophils Absolute: 0.1 K/uL (ref 0.0–0.5)
Eosinophils Relative: 12 %
HCT: 27.7 % — ABNORMAL LOW (ref 39.0–52.0)
Hemoglobin: 9.4 g/dL — ABNORMAL LOW (ref 13.0–17.0)
Immature Granulocytes: 0 %
Lymphocytes Relative: 31 %
Lymphs Abs: 0.4 K/uL — ABNORMAL LOW (ref 0.7–4.0)
MCH: 33.8 pg (ref 26.0–34.0)
MCHC: 33.9 g/dL (ref 30.0–36.0)
MCV: 99.6 fL (ref 80.0–100.0)
Monocytes Absolute: 0.2 K/uL (ref 0.1–1.0)
Monocytes Relative: 13 %
Neutro Abs: 0.5 K/uL — ABNORMAL LOW (ref 1.7–7.7)
Neutrophils Relative %: 43 %
Platelets: 101 K/uL — ABNORMAL LOW (ref 150–400)
RBC: 2.78 MIL/uL — ABNORMAL LOW (ref 4.22–5.81)
RDW: 16.4 % — ABNORMAL HIGH (ref 11.5–15.5)
WBC: 1.2 K/uL — CL (ref 4.0–10.5)
nRBC: 0 % (ref 0.0–0.2)

## 2024-10-19 LAB — COMPREHENSIVE METABOLIC PANEL WITH GFR
ALT: 20 U/L (ref 0–44)
AST: 20 U/L (ref 15–41)
Albumin: 3.8 g/dL (ref 3.5–5.0)
Alkaline Phosphatase: 32 U/L — ABNORMAL LOW (ref 38–126)
Anion gap: 7 (ref 5–15)
BUN: 14 mg/dL (ref 8–23)
CO2: 28 mmol/L (ref 22–32)
Calcium: 8.6 mg/dL — ABNORMAL LOW (ref 8.9–10.3)
Chloride: 106 mmol/L (ref 98–111)
Creatinine, Ser: 1.13 mg/dL (ref 0.61–1.24)
GFR, Estimated: >60 mL/min (ref >60)
Glucose, Bld: 92 mg/dL (ref 70–99)
Potassium: 3.7 mmol/L (ref 3.5–5.1)
Sodium: 142 mmol/L (ref 135–145)
Total Bilirubin: 0.4 mg/dL (ref 0.0–1.2)
Total Protein: 5.8 g/dL — ABNORMAL LOW (ref 6.5–8.1)

## 2024-10-19 LAB — MAGNESIUM: Magnesium: 2 mg/dL (ref 1.7–2.4)

## 2024-10-19 MED ORDER — DENOSUMAB 120 MG/1.7ML ~~LOC~~ SOLN
120.0000 mg | Freq: Once | SUBCUTANEOUS | Status: AC
  Administered 2024-10-19: 120 mg via SUBCUTANEOUS
  Filled 2024-10-19: qty 1.7

## 2024-10-19 MED ORDER — DARATUMUMAB-HYALURONIDASE-FIHJ 1800-30000 MG-UT/15ML ~~LOC~~ SOLN
1800.0000 mg | Freq: Once | SUBCUTANEOUS | Status: AC
  Administered 2024-10-19: 1800 mg via SUBCUTANEOUS
  Filled 2024-10-19: qty 15

## 2024-10-19 NOTE — Progress Notes (Signed)
 CRITICAL VALUE ALERT Critical value received: WBC 1.2 Date of notification:  10-19-24 Time of notification: 0858 Critical value read back:  Yes.   Nurse who received alert:  C. Jamail Cullers RN MD notified time and response:  0900 Dr. Davonna , no new orders.

## 2024-10-19 NOTE — Progress Notes (Signed)
 Patient presents today for chemotherapy Darzalex  Faspro/Xgeva  injections.  Patient is in satisfactory condition with no new complaints voiced.  Vital signs are stable.  Labs reviewed and all other labs are within treatment parameters. Patient's ANC noted to be 0.5, Dr.Kandala made aware.  We will proceed with treatment per MD orders.    Patient took pre-meds at home prior to arrival. Patient denies any tooth or jaw pain and no recent or future major dental procedures at this time. Patient reports taking Calcium /Vit D supplements.  Treatment given today per MD orders. Tolerated infusion without adverse affects. Vital signs stable. No complaints at this time. Discharged from clinic ambulatory in stable condition. Alert and oriented x 3. F/U with [facility] as scheduled.

## 2024-10-19 NOTE — Patient Instructions (Signed)
 [facility] [place] - A DEPT OF [place]  Discharge Instructions: Thank you for choosing  Cancer Center to provide your oncology and hematology care.  If you have a lab appointment with the Cancer Center - please note that after April 8th, 2024, all labs will be drawn in the cancer center.  You do not have to check in or register with the main entrance as you have in the past but will complete your check-in in the cancer center.  Wear comfortable clothing and clothing appropriate for easy access to any Portacath or PICC line.   We strive to give you quality time with your provider. You may need to reschedule your appointment if you arrive late (15 or more minutes).  Arriving late affects you and other patients whose appointments are after yours.  Also, if you miss three or more appointments without notifying the office, you may be dismissed from the clinic at the provider's discretion.      For prescription refill requests, have your pharmacy contact our office and allow 72 hours for refills to be completed.    Today you received the following chemotherapy and/or immunotherapy agents Darzalex  Faspro   To help prevent nausea and vomiting after your treatment, we encourage you to take your nausea medication as directed.  Daratumumab  Injection What is this medication? DARATUMUMAB  (dar a toom ue mab) treats multiple myeloma, a type of bone marrow cancer. It works by helping your immune system slow or stop the spread of cancer cells. It is a monoclonal antibody. This medicine may be used for other purposes; ask your health care provider or pharmacist if you have questions. COMMON BRAND NAME(S): DARZALEX  What should I tell my care team before I take this medication? They need to know if you have any of these conditions: Hereditary fructose intolerance Infection, such as chickenpox, herpes, hepatitis B Lung or breathing disease, such as asthma, COPD An unusual  or allergic reaction to daratumumab , sorbitol, other medications, foods, dyes, or preservatives Pregnant or trying to get pregnant Breastfeeding How should I use this medication? This medication is injected into a vein. It is given by your care team in a hospital or clinic setting. Talk to your care team about the use of this medication in children. Special care may be needed. Overdosage: If you think you have taken too much of this medicine contact a poison control center or emergency room at once. NOTE: This medicine is only for you. Do not share this medicine with others. What if I miss a dose? Keep appointments for follow-up doses. It is important not to miss your dose. Call your care team if you are unable to keep an appointment. What may interact with this medication? Interactions have not been studied. This list may not describe all possible interactions. Give your health care provider a list of all the medicines, herbs, non-prescription drugs, or dietary supplements you use. Also tell them if you smoke, drink alcohol, or use illegal drugs. Some items may interact with your medicine. What should I watch for while using this medication? Your condition will be monitored carefully while you are receiving this medication. This medication can cause serious allergic reactions. To reduce your risk, your care team may give you other medication to take before receiving this one. Be sure to follow the directions from your care team. This medication can affect the results of blood tests to match your blood type. These changes can last for up  to 6 months after the final dose. Your care team will do blood tests to match your blood type before you start treatment. Tell all of your care team that you are being treated with this medication before receiving a blood transfusion. This medication can affect the results of some tests used to determine treatment response; extra tests may be needed to evaluate  response. Talk to your care team if you wish to become pregnant or think you are pregnant. This medication can cause serious birth defects if taken during pregnancy and for 3 months after the last dose. A reliable form of contraception is recommended while taking this medication and for 3 months after the last dose. Talk to your care team about effective forms of contraception. Do not breast-feed while taking this medication. What side effects may I notice from receiving this medication? Side effects that you should report to your care team as soon as possible: Allergic reactions--skin rash, itching, hives, swelling of the face, lips, tongue, or throat Infection--fever, chills, cough, sore throat, wounds that don't heal, pain or trouble when passing urine, general feeling of discomfort or being unwell Infusion reactions--chest pain, shortness of breath or trouble breathing, feeling faint or lightheaded Unusual bruising or bleeding Side effects that usually do not require medical attention (report to your care team if they continue or are bothersome): Constipation Diarrhea Fatigue Nausea Pain, tingling, or numbness in the hands or feet Swelling of the ankles, hands, or feet This list may not describe all possible side effects. Call your doctor for medical advice about side effects. You may report side effects to FDA at 1-800-FDA-1088. Where should I keep my medication? This medication is given in a hospital or clinic. It will not be stored at home. NOTE: This sheet is a summary. It may not cover all possible information. If you have questions about this medicine, talk to your doctor, pharmacist, or health care provider.  2024 Elsevier/Gold Standard (2022-10-16 00:00:00)  BELOW ARE SYMPTOMS THAT SHOULD BE REPORTED IMMEDIATELY: *FEVER GREATER THAN 100.4 F (38 C) OR HIGHER *CHILLS OR SWEATING *NAUSEA AND VOMITING THAT IS NOT CONTROLLED WITH YOUR NAUSEA MEDICATION *UNUSUAL SHORTNESS OF  BREATH *UNUSUAL BRUISING OR BLEEDING *URINARY PROBLEMS (pain or burning when urinating, or frequent urination) *BOWEL PROBLEMS (unusual diarrhea, constipation, pain near the anus) TENDERNESS IN MOUTH AND THROAT WITH OR WITHOUT PRESENCE OF ULCERS (sore throat, sores in mouth, or a toothache) UNUSUAL RASH, SWELLING OR PAIN  UNUSUAL VAGINAL DISCHARGE OR ITCHING   Items with * indicate a potential emergency and should be followed up as soon as possible or go to the Emergency Department if any problems should occur.  Please show the CHEMOTHERAPY ALERT CARD or IMMUNOTHERAPY ALERT CARD at check-in to the Emergency Department and triage nurse.  Should you have questions after your visit or need to cancel or reschedule your appointment, please contact [facility] [place] - A DEPT OF [facility] [phone]  and follow the prompts.  Office hours are 8:00 a.m. to 4:30 p.m. Monday - Friday. Please note that voicemails left after 4:00 p.m. may not be returned until the following business day.  We are closed weekends and major holidays. You have access to a nurse at all times for urgent questions. Please call the main number to the clinic [phone] and follow the prompts.  For any non-urgent questions, you may also contact your provider using MyChart. We now offer e-Visits for anyone 45 and older to request care  online for non-urgent symptoms. For details visit [email or web address].   Also download the MyChart app! Go to the app store, search "MyChart", open the app, select [place], and log in with your MyChart username and password.

## 2024-10-20 ENCOUNTER — Ambulatory Visit

## 2024-10-20 ENCOUNTER — Other Ambulatory Visit

## 2024-11-02 DIAGNOSIS — Z5112 Encounter for antineoplastic immunotherapy: Secondary | ICD-10-CM | POA: Diagnosis not present

## 2024-11-02 DIAGNOSIS — C9000 Multiple myeloma not having achieved remission: Secondary | ICD-10-CM | POA: Diagnosis not present

## 2024-11-02 NOTE — H&P (View-Only) (Signed)
 MYELOMA AND PLASMA CELL DISORDER CLINIC [facility]  Name: Luis Stewart MRN:  [id number] Date:  11/02/2024  PCP: Self, A Referral  Referring physician: Self, A Referral No address on file  Diagnosis: MM, IgG Kappa Date of Diagnosis: 03/10/22 Stage: R-ISS stage unavailable (LDH 160, alb 3.5, B2M not available) Pathology: Plasma cell myeloma in a hypercellular bone marrow (90%) with trilineage hematopoiesis and approximately 70% kappa-restricted plasma cells.  Karyotype: 47, XY, del(20)(q11.2q13.1) [5]/46,XY[17] Myeloma FISH panel: Tetrasomies1, 4, 11, 16 and 20, deletion of 13 q., gain of 14 q./rearrangement of IgH gene Previous Treatment:  Dara-KRD C1: 04/15/22 (rev initially omitted due to renal function) Completed 8 cycles    Collected 7.91x10(6) CD34+ cells/kg  on 01/15/22  Transitioned to revlimid  maintenance   Current Treatment: Drd (dara added due to increase in M spike) Begun around 06/30/24  Currently under work up for cilta-cel  HISTORY OF PRESENT ILLNESS: 73 yo M with a history of recently diagnosed metastatic prostate cancer presents for evaluation of multiple myeloma.  He had previously been followed for elevated PSA and localized prostate cancer, had increasing PSA over time.  Developed worsening back pain, had MRI showing concern for possible metastatic lesions.  Work up was also significant for elevated protein.  Found to have M protein of 4.3.  Bone marrow performed showing 70% restricted plasma cells.  PET was performed which showed no obvious myeloma lesion but PSMA showing multiple avid skeletal lesions consistent with prostate mets.  He still has some back pain but overall somewhat better  Interim History: Patient returns to clinic for evaluation of his myeloma and discussion of T cell collection.  He is overall doing fairly well.  Recently had a cold, still with some residual cough.  No fevers or chills.  Also feels that his  vision has declined since beginning therapy.  No bleeding issues.  REVIEW OF SYSTEMS: A full 12 system ROS was obtained and was negative unless specified above.  ECOG: ECOG/[place]ubrod Performance Status: 0 - Fully active; no performance restrictions KPS (100-90%)  PAST MEDICAL HISTORY:  Active Ambulatory Problems    Diagnosis Date Noted  . GERD (gastroesophageal reflux disease) 10/15/2017  . Prostate cancer (HCC) 08/17/2015  . Pulmonary nodule 07/19/2017  . Multiple myeloma (HCC) 04/09/2022  . Vitamin D  deficiency 06/05/2014  . Pre-transplant evaluation for stem cell transplant 11/14/2022  . Autologous donor of stem cells 11/25/2022  . Benign prostatic hyperplasia with urinary frequency 04/30/2022  . Idiopathic peripheral neuropathy 02/18/2023  . Encounter for antineoplastic immunotherapy 10/19/2024  . Exposure to asbestos 08/17/2015  . History of DVT (deep vein thrombosis) 04/16/2018  . History of pulmonary embolism 06/18/2017   Resolved Ambulatory Problems    Diagnosis Date Noted  . No Resolved Ambulatory Problems   Past Medical History:  Diagnosis Date  . Arthritis   . Cancer    (CMD) 02/26/2022  . Neuromuscular disorder     PAST SURGICAL HISTORY:  Surgical History[1]  MEDICATIONS:  Current Outpatient Medications  Medication Instructions  . acetaminophen  (TYLENOL ) 1,000 mg  . acyclovir  (ZOVIRAX ) 400 mg  . cholecalciferol (VITAMIN D3) 1,000 Units, Daily  . DULoxetine  (CYMBALTA ) 60 mg, Daily  . ferrous sulfate 325 mg, Daily  . lenalidomide  (REVLIMID ) 15 mg  . multivitamin (Flintstones/Extra C) chewable tablet Chew.  . nitroglycerin  (NITROSTAT ) 0.4 mg  . pantoprazole  (PROTONIX ) 40 mg  . potassium chloride  (KLOR-CON ) 10 mEq ER tablet 10 mEq, Daily  . prochlorperazine  (COMPAZINE ) 10 mg  .  Restasis 0.05 % ophthalmic emulsion 1 drop, 2 times daily  . rosuvastatin  (CRESTOR ) 5 mg, Daily    ALLERGIES:  Sulfa (sulfonamide antibiotics)  SOCIAL HISTORY:  Social  History[2]  FAMILY HISTORY: Family History[3]  PHYSICAL EXAMINATION: BP 122/71 (BP Location: Left arm, Patient Position: Sitting)   Pulse 62   Temp 97 F (36.1 C) (Temporal)   Resp 16   Ht 1.88 m (6' 2.02)   Wt 86.2 kg (190 lb)   SpO2 97%   BMI 24.38 kg/m   GENERAL: 73 y.o. year old male, alert and oriented x 3, in no acute distress. HEENT: Anicteric sclerae, EOMI. Nose normal with patent nares. Oropharynx clear and MMM without lesions. NECK: Supple without thyromegaly. No masses. LUNGS: Normal chest expansion. Clear to auscultation without wheezes, crackles or rhonchi.  CARDIAC: RRR. No murmurs, clicks, or rubs. ABDOMEN: Soft, NT/ND. No palpable HSM. No masses. EXTREMITIES: No clubbing, cyanosis, or edema. Musculoskeletal: No weakness. Range of motion intact. Spinous processes non-tender to percussion along the cervical, thoracic, and lumbar spine. NEUROLOGIC: Alert and oriented. Cranial nerves III-XII grossly symmetric and intact.  SKIN: No petechiae or rash noted.  No bleeding or drainage.  LABORATORY DATA: Nurse Only on 11/02/2024  Component Date Value Ref Range Status  . Sodium 11/02/2024 139  136 - 145 mmol/L Final  . Potassium 11/02/2024 3.9  3.4 - 4.5 mmol/L Final   NO VISIBLE HEMOLYSIS  . Chloride 11/02/2024 104  98 - 107 mmol/L Final  . CO2 11/02/2024 30  21 - 31 mmol/L Final   High lactate dehydrogenase (LDH) concentrations in patient samples may cause falsely increased bicarbonate results. If markedly elevated LDH levels are suspected, please assess results in conjunction with patient's LDH values.  . Anion Gap 11/02/2024 5 (L)  6 - 14 mmol/L Final  . Glucose, Random 11/02/2024 94  70 - 99 mg/dL Final  . Blood Urea  Nitrogen (BUN) 11/02/2024 15  7 - 25 mg/dL Final  . Creatinine 88/87/7974 1.11  0.70 - 1.30 mg/dL Final  . eGFR 88/87/7974 70  >59 mL/min/1.63m2 Final   GFR estimated by CKD-EPI equations(NKF 2021).   Recommend confirmation of Cr-based eGFR by using  Cys-based eGFR and other filtration markers (if applicable) in complex cases and clinical decision-making, as needed.  . Albumin 11/02/2024 3.7  3.5 - 5.7 g/dL Final  . Total Protein 11/02/2024 6.1 (L)  6.4 - 8.9 g/dL Final  . Bilirubin, Total 11/02/2024 0.5  0.3 - 1.0 mg/dL Final  . Alkaline Phosphatase (ALP) 11/02/2024 30 (L)  34 - 104 U/L Final  . Aspartate Aminotransferase (AST) 11/02/2024 14  13 - 39 U/L Final  . Alanine Aminotransferase (ALT) 11/02/2024 15  7 - 52 U/L Final  . Calcium  11/02/2024 9.1  8.6 - 10.3 mg/dL Final  . BUN/Creatinine Ratio 11/02/2024    Final   Creatinine is normal, ratio is not clinically indicated.   . Immunoglobulin A, Quantitative (Ig* 11/02/2024 11 (L)  66 - 433 mg/dL Final  . Immunoglobulin G, Quantitative (Ig* 11/02/2024 705  635 - 1,741 mg/dL Final  . Immunoglobulin M, Quantitative (Ig* 11/02/2024 <20 (L)  45 - 281 mg/dL Final  . Free Kappa Light Chains, Serum 11/02/2024 28.79 (H)  3.30 - 19.40 mg/L Final  . Free Lambda Light Chains, Serum 11/02/2024 5.89  5.71 - 26.30 mg/L Final  . Kappa/Lambda Ratio, Serum 11/02/2024 4.89 (H)  0.26 - 1.65 Final  . WBC 11/02/2024 1.70 (L)  4.40 - 11.00 10*3/uL Final  . RBC  11/02/2024 2.94 (L)  4.50 - 5.90 10*6/uL Final  . Hemoglobin 11/02/2024 9.8 (L)  14.0 - 17.5 g/dL Final  . Hematocrit 88/87/7974 28.6 (L)  41.5 - 50.4 % Final  . Mean Corpuscular Volume (MCV) 11/02/2024 97.3 (H)  80.0 - 96.0 fL Final  . Mean Corpuscular Hemoglobin (MCH) 11/02/2024 33.4 (H)  27.5 - 33.2 pg Final  . Mean Corpuscular Hemoglobin Conc (* 11/02/2024 34.3  33.0 - 37.0 g/dL Final  . Red Cell Distribution Width (RDW) 11/02/2024 17.8 (H)  12.3 - 17.0 % Final  . Platelet Count (PLT) 11/02/2024 95 (L)  150 - 450 10*3/uL Final  . Mean Platelet Volume (MPV) 11/02/2024 8.7  6.8 - 10.2 fL Final  . Neutrophils % 11/02/2024 72  % Final  . Lymphocytes % 11/02/2024 15  % Final  . Monocytes % 11/02/2024 6  % Final  . Eosinophils % 11/02/2024 5  %  Final  . Basophils % 11/02/2024 1  % Final  . Metamyelocytes % 11/02/2024 1  % Final  . Neutrophil Absolute (Man Diff) 11/02/2024 1.20 (L)  1.80 - 7.80 10*3/uL Final  . Lymphocytes Absolute (Man Diff) 11/02/2024 0.30 (L)  1.00 - 4.80 10*3/uL Final  . Monocytes Absolute (Man Diff) 11/02/2024 0.10  0.00 - 0.80 10*3/uL Final  . Eosinophils Absolute (Man Diff) 11/02/2024 0.10  0.00 - 0.50 10*3/uL Final  . Basophils Absolute (Man Diff) 11/02/2024 0.00  0.00 - 0.20 10*3/uL Final  . Metamyelocytes Absolute (Man Diff) 11/02/2024 0.00  10*3/uL Final  . RBC & PLT Morphology 11/02/2024 Reviewed   Final  . Dacrocytes/Tear Drop 11/02/2024 1+   Final  . Ovalocytes 11/02/2024 1+   Final  . Schistocytes (RBC Fragments) 11/02/2024 1+   Final     RADIOGRAPHIC DATA: PET 04/13/22 1. No signs of tracer avid lesions of multiple myeloma.  2. Chronic progressive changes involving the sacrum and left iliac  bone including accentuation of trabecular markings and cortical  thickening is again noted with low level FDG uptake (SUV max 4.04.).  Imaging findings suggest chronic Paget's disease.  3. Marked prostate gland enlargement.  4.  Aortic Atherosclerosis   03/11/22 1. Multifocal radiotracer avid prostate cancer skeletal metastasis.  Broad lytic lesion within the sacrum with associated radiotracer  activity. Multiple discrete lesions in the thoracic lumbar spine and  ribs with minimal CT change.  2. Markedly enlarged prostate gland. Focal activity inferior LEFT  aspect the gland is suggestive of prostate adenocarcinoma.  3. No nodal metastasis in the abdomen pelvis. No visceral  metastasis.   PET 11/14/22 No sites of abnormal metabolic activity to suggest active myelomatous disease.   Somewhat diffuse lucency associated with the sacrum possibly related to Paget's disease. There was a focus of increased activity within the sacrum on PSMA PET suggestive of metastatic disease. Overall, on the current study  metabolic activity is at the level of background.  PATHOLOGY DATA:  BMBx 03/10/22 Plasma cell myeloma in a hypercellular bone marrow (90%) with trilineage hematopoiesis and approximately 70% kappa-restricted plasma cells.  The bone marrow is hypercellular (90%) and shows adequate myelopoiesis and erythropoiesis. Megakaryocytes are present in normal numbers with normal morphology.  CD138 immunohistochemistry highlights plasma cells comprising 70% of marrow cellularity and are kappa-restricted by kappa/lambda in situ hybridization. The plasma cells are CD56 negative. A Crystal violet stain is negative. Well circumscribed lymphoid aggregates are seen. Karyotype: 34, XY, del(20)(q11.2q13.1) [5]/46,XY[17] Myeloma FISH panel: Tetrasomies1, 4, 11, 16 and 20, deletion of 13 q., gain of  14 q./rearrangement of IgH gene  PET 11/14/22 Mildly hypercellular bone marrow (50%) with increased erythropoiesis and no increase in plasma cells (<1%).  MRD negative on flow but no plasma cell events reported.  ASSESSMENT AND PLAN: 73 yo M presents for evaluation of multiple myeloma  Multiple myeloma: IgG Kappa, found on evaluation of elevated protein with recently diagnosed metastatic prostate cancer.  ECOG 0-1.  R-ISS staging not available (B2M not available) although appears standard risk from FISH.  Durie-salmon stage II.  Karyotype with del20q as well but no mention of dysplasia on BMBx.  Collected 7.91x10(6) in  Underwent 8 cycles of induction of D-Krd and transitioned to revlimid  maintenance.  Had evidence of biochemical progression so was dara was added back.  He is now interested in pursuing cilta-cel.  He is due to collect cells in the next week.  Would resume therapy post collection as bridge to CART.  Will return closer to timing of CART to perform formal consent but he has been educated on risks of CART including, CRS, ICANS, infection and organ failure.  He was asked to call with any questions or concerns in  interim.   Prostate cancer: metastatic based on PSMA study, currently on ADT.  Tolerating well to date.  Will continue to monitor PSA response as he potentially progresses to transplant.   PSA improved just with ADT.  RTC per protocol          [1] Past Surgical History: Procedure Laterality Date  . COLONOSCOPY  10/05/2023  . [facility]  03/2022  . UPPER GASTROINTESTINAL ENDOSCOPY  2018   2018  [2] Social History Tobacco Use  . Smoking status: Never  . Smokeless tobacco: Never  Substance Use Topics  . Alcohol use: Not Currently  . Drug use: Never  [3] No family history on file.

## 2024-11-07 DIAGNOSIS — M79672 Pain in left foot: Secondary | ICD-10-CM | POA: Diagnosis not present

## 2024-11-07 DIAGNOSIS — G629 Polyneuropathy, unspecified: Secondary | ICD-10-CM | POA: Diagnosis not present

## 2024-11-07 DIAGNOSIS — M79671 Pain in right foot: Secondary | ICD-10-CM | POA: Diagnosis not present

## 2024-11-07 DIAGNOSIS — L851 Acquired keratosis [keratoderma] palmaris et plantaris: Secondary | ICD-10-CM | POA: Diagnosis not present

## 2024-11-07 DIAGNOSIS — M2011 Hallux valgus (acquired), right foot: Secondary | ICD-10-CM | POA: Diagnosis not present

## 2024-11-08 ENCOUNTER — Encounter: Payer: Self-pay | Admitting: Oncology

## 2024-11-08 ENCOUNTER — Other Ambulatory Visit: Payer: Self-pay | Admitting: Oncology

## 2024-11-08 ENCOUNTER — Other Ambulatory Visit: Payer: Self-pay | Admitting: *Deleted

## 2024-11-08 DIAGNOSIS — Z7969 Long term (current) use of other immunomodulators and immunosuppressants: Secondary | ICD-10-CM

## 2024-11-08 DIAGNOSIS — C9000 Multiple myeloma not having achieved remission: Secondary | ICD-10-CM

## 2024-11-08 MED ORDER — DEXAMETHASONE 20 MG PO TABS: ORAL_TABLET | ORAL | 0 refills | Status: DC

## 2024-11-08 MED ORDER — DEXAMETHASONE 4 MG PO TABS: ORAL_TABLET | ORAL | 3 refills | Status: DC

## 2024-11-08 NOTE — Telephone Encounter (Signed)
Please see note on script.

## 2024-11-09 ENCOUNTER — Encounter: Payer: Self-pay | Admitting: Oncology

## 2024-11-11 ENCOUNTER — Other Ambulatory Visit: Payer: Self-pay | Admitting: Oncology

## 2024-11-11 DIAGNOSIS — C9000 Multiple myeloma not having achieved remission: Secondary | ICD-10-CM

## 2024-11-14 ENCOUNTER — Encounter (HOSPITAL_COMMUNITY): Payer: Self-pay | Admitting: Oncology

## 2024-11-14 ENCOUNTER — Encounter: Payer: Self-pay | Admitting: Oncology

## 2024-11-15 ENCOUNTER — Inpatient Hospital Stay

## 2024-11-15 ENCOUNTER — Inpatient Hospital Stay: Attending: Hematology

## 2024-11-15 VITALS — BP 137/77 | HR 65 | Temp 98.0°F | Resp 16 | Wt 189.6 lb

## 2024-11-15 DIAGNOSIS — C9000 Multiple myeloma not having achieved remission: Secondary | ICD-10-CM | POA: Insufficient documentation

## 2024-11-15 DIAGNOSIS — C61 Malignant neoplasm of prostate: Secondary | ICD-10-CM

## 2024-11-15 LAB — COMPREHENSIVE METABOLIC PANEL WITH GFR
ALT: 18 U/L (ref 0–44)
AST: 24 U/L (ref 15–41)
Albumin: 4 g/dL (ref 3.5–5.0)
Alkaline Phosphatase: 38 U/L (ref 38–126)
Anion gap: 6 (ref 5–15)
BUN: 19 mg/dL (ref 8–23)
CO2: 29 mmol/L (ref 22–32)
Calcium: 9.1 mg/dL (ref 8.9–10.3)
Chloride: 107 mmol/L (ref 98–111)
Creatinine, Ser: 1.21 mg/dL (ref 0.61–1.24)
GFR, Estimated: >60 mL/min (ref >60)
Glucose, Bld: 98 mg/dL (ref 70–99)
Potassium: 3.9 mmol/L (ref 3.5–5.1)
Sodium: 141 mmol/L (ref 135–145)
Total Bilirubin: 0.4 mg/dL (ref 0.0–1.2)
Total Protein: 6.1 g/dL — ABNORMAL LOW (ref 6.5–8.1)

## 2024-11-15 MED ORDER — DENOSUMAB 120 MG/1.7ML ~~LOC~~ SOLN
120.0000 mg | Freq: Once | SUBCUTANEOUS | Status: AC
  Administered 2024-11-15: 120 mg via SUBCUTANEOUS
  Filled 2024-11-15: qty 1.7

## 2024-11-15 NOTE — Nursing Note (Signed)
 Pre procedure call made for scheduled procedure on 11-24-24.  Pre procedure phone call attempted, no answer, left message requesting a call back to the [facility] office number. Also sent a message via the patient portal. BFL

## 2024-11-15 NOTE — Progress Notes (Signed)
Patient taking calcium as directed. Denied tooth, jaw, and leg pain. No recent or upcoming dental visits. Labs reviewed. Patient tolerated injection with no complaints voiced. See MAR for details. Patient stable during and after injection. Site clean and dry with no bruising or swelling noted. Band aid applied. Vss with discharge and left in satisfactory condition with no s/s of distress.  

## 2024-11-15 NOTE — Patient Instructions (Signed)
 [facility] [place] - A DEPT OF [place]  Discharge Instructions: Thank you for choosing [facility] to provide your oncology and hematology care.  If you have a lab appointment with the Cancer Center - please note that after April 8th, 2024, all labs will be drawn in the cancer center.  You do not have to check in or register with the main entrance as you have in the past but will complete your check-in in the cancer center.  Wear comfortable clothing and clothing appropriate for easy access to any Portacath or PICC line.   We strive to give you quality time with your provider. You may need to reschedule your appointment if you arrive late (15 or more minutes).  Arriving late affects you and other patients whose appointments are after yours.  Also, if you miss three or more appointments without notifying the office, you may be dismissed from the clinic at the provider's discretion.      For prescription refill requests, have your pharmacy contact our office and allow 72 hours for refills to be completed.    Today you received the following Xgeva  and port flush return as scheduled.   To help prevent nausea and vomiting after your treatment, we encourage you to take your nausea medication as directed.  BELOW ARE SYMPTOMS THAT SHOULD BE REPORTED IMMEDIATELY: *FEVER GREATER THAN 100.4 F (38 C) OR HIGHER *CHILLS OR SWEATING *NAUSEA AND VOMITING THAT IS NOT CONTROLLED WITH YOUR NAUSEA MEDICATION *UNUSUAL SHORTNESS OF BREATH *UNUSUAL BRUISING OR BLEEDING *URINARY PROBLEMS (pain or burning when urinating, or frequent urination) *BOWEL PROBLEMS (unusual diarrhea, constipation, pain near the anus) TENDERNESS IN MOUTH AND THROAT WITH OR WITHOUT PRESENCE OF ULCERS (sore throat, sores in mouth, or a toothache) UNUSUAL RASH, SWELLING OR PAIN  UNUSUAL VAGINAL DISCHARGE OR ITCHING   Items with * indicate a potential emergency and should be followed up as soon as  possible or go to the Emergency Department if any problems should occur.  Please show the CHEMOTHERAPY ALERT CARD or IMMUNOTHERAPY ALERT CARD at check-in to the Emergency Department and triage nurse.  Should you have questions after your visit or need to cancel or reschedule your appointment, please contact [facility] [place] - A DEPT OF [facility] [phone]  and follow the prompts.  Office hours are 8:00 a.m. to 4:30 p.m. Monday - Friday. Please note that voicemails left after 4:00 p.m. may not be returned until the following business day.  We are closed weekends and major holidays. You have access to a nurse at all times for urgent questions. Please call the main number to the clinic [phone] and follow the prompts.  For any non-urgent questions, you may also contact your provider using MyChart. We now offer e-Visits for anyone 46 and older to request care online for non-urgent symptoms. For details visit [email or web address].   Also download the MyChart app! Go to the app store, search MyChart, open the app, select [place], and log in with your MyChart username and password.

## 2024-11-16 ENCOUNTER — Inpatient Hospital Stay

## 2024-11-22 ENCOUNTER — Telehealth: Payer: Self-pay | Admitting: *Deleted

## 2024-11-22 ENCOUNTER — Inpatient Hospital Stay

## 2024-11-22 ENCOUNTER — Other Ambulatory Visit: Payer: Self-pay | Admitting: *Deleted

## 2024-11-22 ENCOUNTER — Inpatient Hospital Stay: Attending: Hematology | Admitting: Oncology

## 2024-11-22 VITALS — Temp 98.4°F | Ht 74.0 in | Wt 191.5 lb

## 2024-11-22 DIAGNOSIS — R1032 Left lower quadrant pain: Secondary | ICD-10-CM

## 2024-11-22 DIAGNOSIS — C9000 Multiple myeloma not having achieved remission: Secondary | ICD-10-CM | POA: Diagnosis present

## 2024-11-22 DIAGNOSIS — R1030 Lower abdominal pain, unspecified: Secondary | ICD-10-CM | POA: Diagnosis not present

## 2024-11-22 DIAGNOSIS — Z5112 Encounter for antineoplastic immunotherapy: Secondary | ICD-10-CM | POA: Insufficient documentation

## 2024-11-22 DIAGNOSIS — D509 Iron deficiency anemia, unspecified: Secondary | ICD-10-CM | POA: Diagnosis not present

## 2024-11-22 DIAGNOSIS — N179 Acute kidney failure, unspecified: Secondary | ICD-10-CM | POA: Insufficient documentation

## 2024-11-22 DIAGNOSIS — Z79899 Other long term (current) drug therapy: Secondary | ICD-10-CM | POA: Insufficient documentation

## 2024-11-22 DIAGNOSIS — E876 Hypokalemia: Secondary | ICD-10-CM | POA: Insufficient documentation

## 2024-11-22 DIAGNOSIS — C61 Malignant neoplasm of prostate: Secondary | ICD-10-CM | POA: Insufficient documentation

## 2024-11-22 DIAGNOSIS — C7951 Secondary malignant neoplasm of bone: Secondary | ICD-10-CM | POA: Diagnosis not present

## 2024-11-22 DIAGNOSIS — Z9484 Stem cells transplant status: Secondary | ICD-10-CM | POA: Diagnosis not present

## 2024-11-22 DIAGNOSIS — R197 Diarrhea, unspecified: Secondary | ICD-10-CM | POA: Insufficient documentation

## 2024-11-22 LAB — CBC WITH DIFFERENTIAL/PLATELET
Abs Immature Granulocytes: 0 K/uL (ref 0.00–0.07)
Basophils Absolute: 0 K/uL (ref 0.0–0.1)
Basophils Relative: 1 %
Eosinophils Absolute: 0 K/uL (ref 0.0–0.5)
Eosinophils Relative: 2 %
HCT: 30.8 % — ABNORMAL LOW (ref 39.0–52.0)
Hemoglobin: 10.3 g/dL — ABNORMAL LOW (ref 13.0–17.0)
Immature Granulocytes: 0 %
Lymphocytes Relative: 24 %
Lymphs Abs: 0.6 K/uL — ABNORMAL LOW (ref 0.7–4.0)
MCH: 33.7 pg (ref 26.0–34.0)
MCHC: 33.4 g/dL (ref 30.0–36.0)
MCV: 100.7 fL — ABNORMAL HIGH (ref 80.0–100.0)
Monocytes Absolute: 0.4 K/uL (ref 0.1–1.0)
Monocytes Relative: 15 %
Neutro Abs: 1.5 K/uL — ABNORMAL LOW (ref 1.7–7.7)
Neutrophils Relative %: 58 %
Platelets: 146 K/uL — ABNORMAL LOW (ref 150–400)
RBC: 3.06 MIL/uL — ABNORMAL LOW (ref 4.22–5.81)
RDW: 16.5 % — ABNORMAL HIGH (ref 11.5–15.5)
WBC: 2.5 K/uL — ABNORMAL LOW (ref 4.0–10.5)
nRBC: 0 % (ref 0.0–0.2)

## 2024-11-22 LAB — COMPREHENSIVE METABOLIC PANEL WITH GFR
ALT: 15 U/L (ref 0–44)
AST: 23 U/L (ref 15–41)
Albumin: 4.2 g/dL (ref 3.5–5.0)
Alkaline Phosphatase: 35 U/L — ABNORMAL LOW (ref 38–126)
Anion gap: 11 (ref 5–15)
BUN: 19 mg/dL (ref 8–23)
CO2: 23 mmol/L (ref 22–32)
Calcium: 8.9 mg/dL (ref 8.9–10.3)
Chloride: 108 mmol/L (ref 98–111)
Creatinine, Ser: 1.27 mg/dL — ABNORMAL HIGH (ref 0.61–1.24)
GFR, Estimated: 59 mL/min — ABNORMAL LOW (ref >60)
Glucose, Bld: 104 mg/dL — ABNORMAL HIGH (ref 70–99)
Potassium: 4.3 mmol/L (ref 3.5–5.1)
Sodium: 142 mmol/L (ref 135–145)
Total Bilirubin: 0.5 mg/dL (ref 0.0–1.2)
Total Protein: 6.7 g/dL (ref 6.5–8.1)

## 2024-11-22 NOTE — Progress Notes (Signed)
 Port flushed with good blood return noted. No bruising or swelling at site. Bandaid applied and patient discharged in satisfactory condition. VVS stable with no signs or symptoms of distressed noted.

## 2024-11-22 NOTE — Telephone Encounter (Signed)
 Patient called to advise that he has had ongoing pain in lymph node left groin, associated with nausea and moderate post prandial bloating.  States that this has been going on for approx. 2 weeks.  He is for CART on Thursday at [facility].  Reached out to Eleanor Clover, RN to make her aware.  Per Dr. Davonna, she will see him in the office this afternoon with CBC/CMP.  Patient aware and verbalized understanding.

## 2024-11-22 NOTE — Telephone Encounter (Signed)
 Reached out to Eleanor Clover, RN @ [facility] to advise that patient can proceed with CAR T collection as planned and he has treatment scheduled here for 12/01/2024.

## 2024-11-22 NOTE — Progress Notes (Signed)
 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 male with metastatic castrate sensitive prostate cancer and IgG kappa multiple myeloma  Assessment and Plan  Multiple myeloma not having achieved remission IgG kappa multiple myeloma (standard cytogenetics) s/p induction with 8 cycles of D-KRD Patient had stem cells collected but did not undergo stem cell transplant Was on maintenance Revlimid  prior to biochemical recurrence recently Started on daratumumab  and was evaluated for CAR-T by Dr. Fernande at  [facility] and planned for CAR-T infusion on 01/11/2024   - Labs reviewed today: CMP:Cr:1.27, Normal LFTs, CBC: WBC: 2.5, ANC: 1500, hemoglobin: 10.3, platelets: 146. - SPEP: M spike: 0.5, kappa free light chain: 29.3, lambda free light chain: 7.9, ratio: 3.71 -Discussed with the patient that multiple myeloma labs are more or less stable and to continue current regimen of daratumumab  and Revlimid . - Patient has stable leukopenia and can consider dose reduction of Revlimid  if worsens - Continue to follow with Dr. Fernande for CAR-T treatment.  Will coordinate care with Dr. Fernande -Planned for medication cessation for washout prior to leukapheresis:  10/19/24 Last dose of Daratumumab  prior to Leukapheresis  11/02/24 Last dose of Revlimid  and Dexamethasone  -CART cell harvesting scheduled for 11/24/2024  Return to clinic for follow-up after leukapheresis for continuation of  treatment.  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  Last dose 10/02/2024 - Continue denosumab  injection 120mg  monthly - Continue vitamin D  and calcium  supplementation -Continue to follow with urology  Iron deficiency anemia Iron deficiency anemia managed with oral iron. Constipation noted with daily intake. - Continue iron supplementation every other day.  Chronic diarrhea Chronic diarrhea managed with Imodium. - Continue Imodium as needed for diarrhea.  Hypokalemia on potassium supplementation Hypokalemia managed with potassium supplementation. - Continue potassium supplementation at 10 mEq daily.  AKI Mild AKI with Cr:1.27. Likely secondary to dehydration  -Encouraged oral hydration  Groin Pain No lymphadenopathy or tenderness on palpation  -Recommended Warm compression and tylenol  -Avoid ibuprofen.   The patient understands the plans discussed today and is in agreement with them.  He knows to contact our office if he develops concerns prior to his next appointment.  20 minutes of total time was spent for this patient encounter, including preparation,review of records,  face-to-face counseling with the patient and coordination of care, physical exam, and documentation of the encounter.    Luis Stewart,acting as a neurosurgeon for Luis Dry, MD.,have documented all relevant documentation on the behalf of Luis Dry, MD,as directed by  Luis Dry, MD while in the presence of Luis Dry, MD.  I, Luis Dry MD, have reviewed the above documentation for accuracy and completeness, and I agree with the above.    Luis Dry, MD  [facility] [place] - A DEPT OF [place] [address] [place]  [address] Dept:  [phone] Dept Fax: [phone]   No orders of the defined types were placed in this encounter.    ONCOLOGY HISTORY:   I have reviewed his chart and materials related to his cancer extensively and collaborated history with the patient. 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  Pathology: 1/12 cores positive for prostatic adenocarcinoma. Gleason score 3+3=6 involves 10% of Right lateral base core.  Prostate volume 175 cc. - 06/20/2015: PSA 16.66.  PSA density 0.18.  -10/09/2015: PSA 17.16 -12/18/2015: Prostate MRI: No findings specific for macroscopic prostate cancer.  -01/03/2016: Patient evaluated by Luis Stewart [Urology] for robotic prostatectomy and felt not to be a candidate for surgery due to low-volume prostate cancer and low PSA density. Luis Stewart recommended active surveillance.  -01/22/2016: Surveillance TRUS and biopsy.  Pathology: All 12 cores showed benign prostatic tissue. Prostate volume was 190 mL.  -2017-2022: Patient remained under surveillance with TRUS/biopsy and monitoring PSA -11/04/2016: PSA 15.4 -05/15/2017: PSA 17.9 -04/19/2018: PSA 18.4 -08/11/2018: Fusion TRUS and biopsy.  Pathology: 1/12 cores showed small focus of prostatic adenocarcinoma, Gleason score 3+3=6 involves 5% of left apex core with cancer length of 0.09 cm. Prostate volume 220 mL. No significant lesions.  No evidence of transcapsular, ureteral, SV/bony or lymphatic disease.  -02/15/2019: PSA 15.9 -03/20/2020: PSA 15.5 -03/20/2021: PSA 22.4 -09/18/2021: PSA 34.5 -11/27/2021: MRI lumbar spine: Expanded appearance of the sacrum with diffusely abnormal bone marrow signal and contrast enhancement, concerning for metastatic disease. -11/27/2021: CT pelvis: Extreme prostatomegaly. No evidence of pelvic lymphadenopathy or other findings to suggest metastatic disease. -03/13/2022: PSMA PET:  Multifocal radiotracer avid prostate cancer skeletal metastasis. Broad lytic lesion within the sacrum with associated radiotracer activity. Multiple discrete lesions in the thoracic lumbar spine and ribs with minimal CT change. Markedly enlarged prostate gland. Focal activity inferior LEFT aspect the gland is suggestive of prostate adenocarcinoma. No nodal metastasis in the abdomen pelvis. No visceral metastasis. -04/15/2022-11/11/2022: Degarelix  -05/06/2022-06/30/2024: PSA: 27.67-->0.26 -02/18/2023-current: Monthly Xgeva  -03/26/2023-current: Eligard  every 6 months   Diagnosis: IgG kappa multiple myeloma with complex cytogenetics   -02/26/2022: Initial Myeloma labs showed IgG monoclonal protein with kappa light chain specificity. IgG 4,990. FLC ratio 106.24 with elevated kappa light chains at 626.8. M spike 4.3. Gamma globulin 4.6. LDH 160.  -03/20/2022: Bone Marrow Biopsy.  -Pathology: The bone marrow core biopsy demonstrates hypercellular marrow for age (95%) with large and atypical appearing plasma cells arranged in sheets and attenuating the normal hematopoietic elements. By CD138 immunohistochemistry plasma cells comprise approximately 70% of the cellular elements and are kappa-restricted by light chain in situ hybridization.  The plasma cells do not express CD56.  Amyloid deposition is not seen by Congo red or crystal violet stain.  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  -04/15/2022-11/11/2022: 8 cycles of Dara KRD completed -10/2022- Current: Maintenance Revlimid  -11/25/2022: Stem cell collection has been completed at [facility]. Patient decided not to undergo stem cell transplant. -06/30/2024: Daratumumab  added back to regimen for worsening M spike -07/29/2024: Consultation with  LuisMcKay and patient is willing to proceed with CART  Current Treatment:  Daratumumab  +  Revlimid   INTERVAL HISTORY:   Luis Stewart is a 73 y.o. male presenting to the  clinic today for left groin pain. He has a history of multiple myeloma and prostate carcinoma. Patient is accompanied by his wife today.   He reports waxing and waning groin pain for the past week that improves when stretching or moving his neck. He also notes bloating after eating, as well as back pain. Luis Stewart had 3 BM's yesterday with soft to hard stools. He has had no BM's today. His appetite has decreased the last few days this week attributable to bloating and nausea. Luis Stewart also notes indigestion.   He has cell harvesting for CART scheduled for 11/24/2024.   I have reviewed the past medical history, past surgical history, social history and family history with the patient and they are unchanged from previous note.  ALLERGIES:  is allergic to sulfa antibiotics.  MEDICATIONS:  Current Outpatient Medications  Medication Sig Dispense Refill   acetaminophen  (TYLENOL ) 500 MG tablet Take 1,000 mg by mouth every 6 (six) hours as needed for mild pain or moderate pain.     acyclovir  (ZOVIRAX ) 400 MG tablet TAKE 1 TABLET BY MOUTH TWICE A DAY 180 tablet 2   albuterol  (VENTOLIN  HFA) 108 (90 Base) MCG/ACT inhaler Inhale 2 puffs into the lungs every 6 (six) hours as needed for wheezing or shortness of breath. 18 g 0   Cholecalciferol (VITAMIN D ) 2000 units CAPS Take 2,000 Units by mouth every other day.     dexamethasone  (DECADRON ) 4 MG tablet Take 20 mg 30 minutes prior to treatment 5 tablet 3   DULoxetine  (CYMBALTA ) 60 MG capsule TAKE 1 CAPSULE BY MOUTH EVERY DAY 90 capsule 2   ferrous sulfate 325 (65 FE) MG EC tablet Take 325 mg by mouth daily.     guaiFENesin -codeine  100-10 MG/5ML syrup Take 5 mLs by mouth 3 (three) times daily as needed for cough. 120 mL 0   Multiple Vitamin (MULTIVITAMIN WITH MINERALS) TABS  tablet Take 1 tablet by mouth daily with breakfast. Centrum Silver for Men     nitroGLYCERIN  (NITROSTAT ) 0.4 MG SL tablet Place 1 tablet (0.4 mg total) under the tongue every 5 (five) minutes x 3 doses as needed for chest pain (If no relief after 3rd dose, call or go to ED.). 30 tablet 0   pantoprazole  (PROTONIX ) 40 MG tablet TAKE 1 TABLET BY MOUTH EVERY OTHER DAY 45 tablet 1   potassium chloride  (KLOR-CON ) 10 MEQ tablet Take 1 tablet (10 mEq total) by mouth daily. 90 tablet 1   RESTASIS 0.05 % ophthalmic emulsion Place 1 drop into both eyes 2 (two) times daily.     rosuvastatin  (CRESTOR ) 5 MG tablet TAKE 1 TABLET (5 MG TOTAL) BY MOUTH DAILY. 90 tablet 3   lenalidomide  (REVLIMID ) 15 MG capsule TAKE 1 CAPSULE BY MOUTH 1 TIME A DAY FOR 21 DAYS ON THEN 7 DAYS OFF (Patient not taking: Reported on 11/22/2024) 21 capsule 0   ondansetron  (ZOFRAN ) 8 MG tablet Take 1 tablet (8 mg total) by mouth every 8 (eight) hours as needed for nausea or vomiting. 20 tablet 0   No current facility-administered medications for this visit.    REVIEW OF SYSTEMS:   All the systems were reviewed with the patient and are negative except HPI.   VITALS:  Temperature 98.4 F (36.9 C), temperature source Tympanic, height 6' 2 (1.88 m), weight 191 lb 8 oz (86.9 kg).  Wt Readings from Last 3 Encounters:  11/22/24 191 lb 8 oz (86.9 kg)  11/15/24 189 lb 9.5 oz (86 kg)  10/19/24 190 lb  3.2 oz (86.3 kg)    Body mass index is 24.59 kg/m.  Performance status (ECOG): 1 - Symptomatic but completely ambulatory  PHYSICAL EXAM:   GENERAL:alert, no distress and comfortable SKIN: skin color, texture, turgor are normal, no rashes or significant lesions LYMPH:  no palpable lymphadenopathy in the cervical, axillary or inguinal LUNGS: clear to auscultation and percussion with normal breathing effort HEART: regular rate & rhythm and no murmurs and no lower extremity edema ABDOMEN:abdomen soft, non-tender and normal bowel  sounds Musculoskeletal:No tenderness in the left groin area. NEURO: alert & oriented x 3 with fluent speech  LABORATORY DATA:  I have reviewed the data as listed Lab Results  Component Value Date   WBC 2.5 (L) 11/22/2024   NEUTROABS 1.5 (L) 11/22/2024   HGB 10.3 (L) 11/22/2024   HCT 30.8 (L) 11/22/2024   MCV 100.7 (H) 11/22/2024   PLT 146 (L) 11/22/2024      Chemistry      Component Value Date/Time   NA 142 11/22/2024 1332   NA 138 02/06/2022 0940   K 4.3 11/22/2024 1332   CL 108 11/22/2024 1332   CO2 23 11/22/2024 1332   BUN 19 11/22/2024 1332   BUN 18 02/06/2022 0940   CREATININE 1.27 (H) 11/22/2024 1332   CREATININE 1.23 11/22/2021 1309      Component Value Date/Time   CALCIUM  8.9 11/22/2024 1332   ALKPHOS 35 (L) 11/22/2024 1332   AST 23 11/22/2024 1332   ALT 15 11/22/2024 1332   BILITOT 0.5 11/22/2024 1332   BILITOT 0.3 02/06/2022 0940      Latest Reference Range & Units 09/15/24 14:44  Total Protein ELP 6.0 - 8.5 g/dL 5.5 (L)  Albumin ELP 2.9 - 4.4 g/dL 3.1  Globulin, Total 2.2 - 3.9 g/dL 2.4 (C)  A/G Ratio 0.7 - 1.7  1.3 (C)  Alpha-1-Globulin 0.0 - 0.4 g/dL 0.2  Joeyj-7-Honalopw 0.4 - 1.0 g/dL 0.7  Beta Globulin 0.7 - 1.3 g/dL 0.8  Gamma Globulin 0.4 - 1.8 g/dL 0.7  M-SPIKE, % Not Observed g/dL 0.5 (H)  (L): Data is abnormally low (H): Data is abnormally high (C): Corrected   Latest Reference Range & Units 09/15/24 14:44  Kappa free light chain 3.3 - 19.4 mg/L 29.3 (H)  Lambda free light chains 5.7 - 26.3 mg/L 7.9  Kappa, lambda light chain ratio 0.26 - 1.65  3.71 (H)  (H): Data is abnormally high:  RADIOGRAPHIC STUDIES: I have personally reviewed the radiological images as listed and agreed with the findings in the report.

## 2024-11-24 DIAGNOSIS — Z5112 Encounter for antineoplastic immunotherapy: Secondary | ICD-10-CM | POA: Diagnosis not present

## 2024-11-24 DIAGNOSIS — C9000 Multiple myeloma not having achieved remission: Secondary | ICD-10-CM | POA: Diagnosis not present

## 2024-11-24 NOTE — Nursing Note (Addendum)
 Patient tolerated procedure without difficulty. Placed a double lumen vascath. Patient received 2 g of ancef , 0.5 mg of versed  and 25 mcg of fentanyl . VSS throughout procedure. Report given to Therisa, CHARITY FUNDRAISER.

## 2024-11-24 NOTE — Interval H&P Note (Signed)
 Interv Radiol Vasc H&P Update   Patient: Luis Stewart MRN: [id number]          Age: 73 y.o. Sex: male  DOB: [birth year]  Associated Diagnosis: 1. Multiple myeloma not having achieved remission    (CMD)   2. Encounter for antineoplastic immunotherapy          Author: Karey Abbe Bathe, NP        Attestation: I have review the history and physicals located in the electronic medical record, that have been performed within the 30 days prior to surgery today, and I concur with the assessment therein. I have seen and examined the patient today and I find no substantive changes that would alter the course of therapy/surgery today.  H&P dated 10/05/2023 was reviewed without changes. The patient presents today for a placement of a Vas Cath.  Karey Abbe Bathe, NP 8:36 AM 11/24/2024

## 2024-11-24 NOTE — Anesthesia Postprocedure Evaluation (Signed)
 Interventional Radiology Brief Postprocedure Note  Patient: Luis Stewart Age: 73 y.o.  MRN: [id number] Attending: No att. providers found   Date: November 24, 2024 10:58 AM  Procedure performed: IR Vascath Insertion  Pre procedure diagnosis: Multiple myeloma   Primary Proceduralist: Karey Abbe Bathe, NP  Assistant(s): none   Relevant Labs:  Lab Results  Component Value Date   CREATININE 1.50 (H) 11/24/2024   INR 1.5 10/10/2024   PROTIME 16.3 (H) 10/10/2024    Post procedure diagnosis: Same   Anesthesia (type): Moderate Sedation and Local  Implants: Nothing was implanted during the procedure  Specimen:    Disposition of specimens: No  Blood administered: See Blood Administration Record.  Estimated blood loss: Minimal  Increased risk of post procedure bleeding: No  Complications: None noted  Most recent vitals: Vitals:   11/24/24 1055  BP: 127/69  Pulse: 68  Resp: (!) 22  Temp:   SpO2: 97%    Intake and Output: No intake/output data recorded.  Drain output: Output by Drain (mL) 11/22/24 0701 - 11/22/24 1900 11/22/24 1901 - 11/23/24 0700 11/23/24 0701 - 11/23/24 1900 11/23/24 1901 - 11/24/24 0700 11/24/24 0701 - 11/24/24 1058  Patient has no LDAs of requested type attached.    Findings and Description: Right internal jugular Vasp cath placed under ultrasound and fluoroscopy guidance. Catheter terminates at the superior cavoatrial junction. Vas cath ready for immediate use.      IR Procedure Medications: Medications (Filter: Administrations occurring from 0949 to 1056 on 11/24/24) As of 11/24/24 1056    midazolam  (VERSED ) injection (mg) Total dose:  0.5 mg    Date/Time Rate/Dose/Volume Action Route Admin User     11/24/24  1036 0.5 mg Given intravenous Camilline Grace Beller, RN          fentaNYL  (SUBLIMAZE ) injection (mcg) Total dose:  25 mcg    Date/Time Rate/Dose/Volume Action Route Admin User     11/24/24  1036 25 mcg Given  intravenous Camilline Grace Beller, RN          ceFAZolin  (ANCEF ) injection (g) Total dose:  2 g    Date/Time Rate/Dose/Volume Action Route Admin User     11/24/24  1036 2 g Given intravenous Camilline Ronnald Decent, RN               See detailed result report with images in PACS.  The patient tolerated the procedure well without incident or complication and is in stable condition.  SABRA

## 2024-11-24 NOTE — Pre-Procedure Assessment (Signed)
 Interventional Radiology Preprocedure Note   Patient: Luis Stewart Age: 73 y.o.  MRN: [id number] Attending: Dr. Cleotilde   Date: November 24, 2024 8:36 AM  Indication for procedure: Multiple myeloma   Relevant Labs:  Lab Results  Component Value Date   CREATININE 1.50 (H) 11/24/2024   INR 1.5 10/10/2024   PROTIME 16.3 (H) 10/10/2024    Sedation/Anesthesia: Moderate Sedation and Local  Airway assessment: normal  Mallampati: II (hard and soft palate, upper portion of tonsils anduvula visible)  ASA Score: ASA 3 - Patient with moderate systemic disease with functional limitations  Benefits, risks and alternatives of procedure and planned sedation have been discussed with the patient and/or their representative. All questions answered and they agree to proceed.

## 2024-11-24 NOTE — Telephone Encounter (Signed)
 Patient has been off chemotherapy for CAR T cell collection. He was having some worsening pain started earlier this week in the Lt groin. He is scheduled to start Dara back with local oncologist on 12/01/24. Creatinine has been increasing. Myeloma labs obtained today. Will give patient pulse of steroids Dexamethasone  20mg  X 4 days. Will reassess myeloma labs to see if change of therapy needed.

## 2024-11-25 ENCOUNTER — Other Ambulatory Visit: Payer: Self-pay | Admitting: Oncology

## 2024-11-25 MED ORDER — ONDANSETRON HCL 8 MG PO TABS: 8.0000 mg | ORAL_TABLET | Freq: Three times a day (TID) | ORAL | 0 refills | Status: DC | PRN

## 2024-11-25 NOTE — Progress Notes (Signed)
 Patient called with concerns of nausea and vomiting.  Patient has past medical history significant for IgG kappa multiple myeloma standard cytogenetics status post induction with 8 cycles of D-KRD.  Patient had stem cells collected but did not undergo stem cell transplant.  Was on maintenance Revlimid  prior to biochemical recurrence recently.  Started on daratumumab  and was evaluated for CAR-T by Dr. Fernande at [facility].  Most recently plan was for medication cessation for washout prior to leukapheresis planned on 11/09/2024.  His last dose of daratumumab  prior to leukapheresis was on 10/19/2024.  Last dose of Revlimid  and dexamethasone  was on 11/02/2024.  Per notes, patient had CAR-T cell collection yesterday.  He was given dexamethasone  20 mg x 4 days yesterday.  Patient took 1 dose of expired Compazine  with improvement of his symptoms.  He is wondering what he can take for nausea.  He has tried to reach out to [facility] but they have sent him back to us .  Recommend Zofran  8 mg every 6-8 hours as needed for nausea.  We discussed also the dexamethasone  will be effective for nausea as long as he takes it with food.  Strict ED precautions. Patient has follow-up with us  next week.  Delon Hope, AGNP-C Department of Hematology/Oncology [facility]  Phone: [phone]  11/25/2024 11:28 AM

## 2024-11-25 NOTE — Telephone Encounter (Signed)
 Dr Fernande,   Can you review labs and let me know if he can continue on current treatment? He has Revlimid  to restart if  he will be continuing current regimen.   10/19/24 Last dose of Daratumumab  prior to Leukapheresis  11/02/24 Last dose of Revlimid  and Dexamethasone   He has been off therapy longer than intended since we had to recollect CAR T cells.

## 2024-11-25 NOTE — Progress Notes (Signed)
 Patient called stating that he had called and spoke with Melissa from [facility] this morning and told her that he has had two bouts of vomiting since his therapy yesterday. He states that she told him to contact us  concerning this. He also states that he had taken some Prochlorperazine  and it had helped the nausea some. Spoke with Delon Hope, NP and she reviewed his chart and sent in Zofran  for patient to take as needed in addition to the steroids that he is on from [facility] to help his nausea. Patient is aware and agreeable with plan to stay hydrated and that if the Zofran  is not helping that he needs to go to the ED.

## 2024-11-29 ENCOUNTER — Encounter (HOSPITAL_COMMUNITY): Payer: Self-pay | Admitting: Oncology

## 2024-11-29 ENCOUNTER — Other Ambulatory Visit: Payer: Self-pay | Admitting: Internal Medicine

## 2024-11-29 ENCOUNTER — Encounter: Payer: Self-pay | Admitting: Oncology

## 2024-11-29 DIAGNOSIS — G609 Hereditary and idiopathic neuropathy, unspecified: Secondary | ICD-10-CM

## 2024-11-29 DIAGNOSIS — M48062 Spinal stenosis, lumbar region with neurogenic claudication: Secondary | ICD-10-CM

## 2024-11-30 NOTE — Telephone Encounter (Signed)
 Faxed recommendations to [facility] [name] at request of Joesph - nurse navigator.   Faxed to [phone]

## 2024-11-30 NOTE — Telephone Encounter (Signed)
 Patient has completed collection for CAR T as of 11/24/24.  Below are recommendations from Dr Fernande for next line of therapy: Carfilzomib , Pomalyst and Dexamethasone   Carfilzomib  20mg /m2 first dose (cycle 1 day 1) then cap at 56gm/m2 (Day 8 and 15) Carfilzomib  on days 1, 8, 15 of 28 day cycle  Pomalyst 3mg  on days 1-21 of 28 day cycle  Dexamethasone  40 mg weekly

## 2024-12-01 ENCOUNTER — Other Ambulatory Visit: Payer: Self-pay

## 2024-12-01 ENCOUNTER — Other Ambulatory Visit (HOSPITAL_COMMUNITY): Payer: Self-pay

## 2024-12-01 ENCOUNTER — Inpatient Hospital Stay

## 2024-12-01 DIAGNOSIS — C61 Malignant neoplasm of prostate: Secondary | ICD-10-CM

## 2024-12-01 DIAGNOSIS — Z5112 Encounter for antineoplastic immunotherapy: Secondary | ICD-10-CM | POA: Diagnosis not present

## 2024-12-01 DIAGNOSIS — C9000 Multiple myeloma not having achieved remission: Secondary | ICD-10-CM

## 2024-12-01 LAB — CBC WITH DIFFERENTIAL/PLATELET
Abs Immature Granulocytes: 0.02 K/uL (ref 0.00–0.07)
Basophils Absolute: 0 K/uL (ref 0.0–0.1)
Basophils Relative: 0 %
Eosinophils Absolute: 0.1 K/uL (ref 0.0–0.5)
Eosinophils Relative: 2 %
HCT: 32.3 % — ABNORMAL LOW (ref 39.0–52.0)
Hemoglobin: 10.8 g/dL — ABNORMAL LOW (ref 13.0–17.0)
Immature Granulocytes: 1 %
Lymphocytes Relative: 15 %
Lymphs Abs: 0.6 K/uL — ABNORMAL LOW (ref 0.7–4.0)
MCH: 33.5 pg (ref 26.0–34.0)
MCHC: 33.4 g/dL (ref 30.0–36.0)
MCV: 100.3 fL — ABNORMAL HIGH (ref 80.0–100.0)
Monocytes Absolute: 0.5 K/uL (ref 0.1–1.0)
Monocytes Relative: 12 %
Neutro Abs: 2.7 K/uL (ref 1.7–7.7)
Neutrophils Relative %: 70 %
Platelets: 126 K/uL — ABNORMAL LOW (ref 150–400)
RBC: 3.22 MIL/uL — ABNORMAL LOW (ref 4.22–5.81)
RDW: 15.8 % — ABNORMAL HIGH (ref 11.5–15.5)
WBC: 3.9 K/uL — ABNORMAL LOW (ref 4.0–10.5)
nRBC: 0 % (ref 0.0–0.2)

## 2024-12-01 LAB — COMPREHENSIVE METABOLIC PANEL WITH GFR
ALT: 35 U/L (ref 0–44)
AST: 23 U/L (ref 15–41)
Albumin: 3.9 g/dL (ref 3.5–5.0)
Alkaline Phosphatase: 37 U/L — ABNORMAL LOW (ref 38–126)
Anion gap: 11 (ref 5–15)
BUN: 25 mg/dL — ABNORMAL HIGH (ref 8–23)
CO2: 21 mmol/L — ABNORMAL LOW (ref 22–32)
Calcium: 8.2 mg/dL — ABNORMAL LOW (ref 8.9–10.3)
Chloride: 106 mmol/L (ref 98–111)
Creatinine, Ser: 1.23 mg/dL (ref 0.61–1.24)
GFR, Estimated: >60 mL/min (ref >60)
Glucose, Bld: 94 mg/dL (ref 70–99)
Potassium: 3.9 mmol/L (ref 3.5–5.1)
Sodium: 138 mmol/L (ref 135–145)
Total Bilirubin: 0.6 mg/dL (ref 0.0–1.2)
Total Protein: 6.9 g/dL (ref 6.5–8.1)

## 2024-12-01 LAB — MAGNESIUM: Magnesium: 2.1 mg/dL (ref 1.7–2.4)

## 2024-12-01 MED ORDER — POMALIDOMIDE 3 MG PO CAPS
3.0000 mg | ORAL_CAPSULE | Freq: Every day | ORAL | 0 refills | Status: DC
  Filled 2024-12-01: qty 21, 21d supply, fill #0

## 2024-12-01 NOTE — Telephone Encounter (Signed)
 Chart reviewed. Pomalyst refilled per recommendation of Dr.McKay and Delon Hope, NP and Dr. Federico reviewed as well.

## 2024-12-01 NOTE — Progress Notes (Signed)
 No treatment today.  Patient to return 12/05/2024.

## 2024-12-02 ENCOUNTER — Encounter (HOSPITAL_COMMUNITY): Payer: Self-pay

## 2024-12-02 ENCOUNTER — Other Ambulatory Visit: Payer: Self-pay

## 2024-12-02 ENCOUNTER — Telehealth: Payer: Self-pay | Admitting: Pharmacy Technician

## 2024-12-02 ENCOUNTER — Other Ambulatory Visit (HOSPITAL_COMMUNITY): Payer: Self-pay

## 2024-12-02 ENCOUNTER — Telehealth: Payer: Self-pay | Admitting: Pharmacist

## 2024-12-02 ENCOUNTER — Other Ambulatory Visit: Payer: Self-pay | Admitting: Pharmacy Technician

## 2024-12-02 MED ORDER — POMALIDOMIDE 3 MG PO CAPS: 3.0000 mg | ORAL_CAPSULE | Freq: Every day | ORAL | 0 refills | Status: DC

## 2024-12-02 NOTE — Telephone Encounter (Signed)
 Clinical Pharmacist Practitioner Encounter   Received new prescription for Pomalyst (pomalidomide) for the treatment of multiple myeloma in conjunction with carfilzomib  and dexamethasone , planned duration, until patient is ready for CAR-T.  Patient is scheduled to begin new regimen on 12/05/2024.  Note, it is unlikely patient will have pomalidomide in hand by that date.  Per [facility] telephone encounter from 11/24/2024 (note left on 11/25/24) patient bridge therapy until CAR-T is ready should be pomalidomide, carfilzomib , and dexamethasone .   [facility] note from 11/25/24 stated there was a required washout prior to lymphodepletion which is planned on 12/31/23, Pomalidomde and dexamethasone  washout of 7 days, so no doses after 12/23/24.  CMP from 12/01/2024 assessed, no relevant lab abnormalities. Prescription dose and frequency assessed.   Current medication list in Epic reviewed, no relevant DDIs with pomalidomide identified.  Evaluated chart and no patient barriers to medication adherence identified.   Prescription has been e-scribed to [facility]  Oral Oncology Clinic will continue to follow for insurance authorization, copayment issues, initial counseling and start date.   Keily Lepp N. Soniyah Mcglory, PharmD, BCOP, CPP Hematology/Oncology Clinical Pharmacist [facility] Oral Chemotherapy Navigation Clinic [phone]  12/02/2024 9:44 AM

## 2024-12-02 NOTE — Telephone Encounter (Signed)
 Oral Oncology Patient Advocate Encounter  Prior Authorization for Pomalyst has been approved.    PA# [id number] Effective dates: 12/02/2024 through 12/21/2024  Patients co-pay is $0. Patient can continue filling [facility].   Luis Stewart ([name]) [name], CPhT  [facility], [name], [name] - Oral Chemotherapy Patient Advocate Specialist III Phone: [phone]  Fax: [phone]

## 2024-12-02 NOTE — Progress Notes (Signed)
 Oral Oncology Patient Advocate Encounter  REMs drugs cannot be filled through [facility].  Luis Stewart ([name]) [name], CPhT  [facility], [name], [name] - Oral Chemotherapy Patient Advocate Specialist III Phone: [phone]  Fax: [phone]

## 2024-12-02 NOTE — Telephone Encounter (Signed)
 Oral Oncology Patient Advocate Encounter   New authorization   Received notification that prior authorization for Pomalyst is required.   PA submitted on CMM via Latent Key [id number] Status is pending     Luis Stewart ([name]) [name], CPhT  [facility], [name], [name] - Oral Chemotherapy Patient Advocate Specialist III Phone: [phone]  Fax: [phone]

## 2024-12-05 ENCOUNTER — Other Ambulatory Visit: Payer: Self-pay | Admitting: Oncology

## 2024-12-05 ENCOUNTER — Inpatient Hospital Stay

## 2024-12-05 VITALS — BP 121/72 | HR 66 | Temp 96.2°F | Resp 18 | Wt 182.4 lb

## 2024-12-05 DIAGNOSIS — C9000 Multiple myeloma not having achieved remission: Secondary | ICD-10-CM

## 2024-12-05 DIAGNOSIS — Z5112 Encounter for antineoplastic immunotherapy: Secondary | ICD-10-CM | POA: Diagnosis not present

## 2024-12-05 LAB — IMMUNOFIXATION ELECTROPHORESIS
IgA: 12 mg/dL — ABNORMAL LOW (ref 61–437)
IgG (Immunoglobin G), Serum: 1294 mg/dL (ref 603–1613)
IgM (Immunoglobulin M), Srm: 11 mg/dL — ABNORMAL LOW (ref 15–143)
Total Protein ELP: 6.3 g/dL (ref 6.0–8.5)

## 2024-12-05 MED ORDER — SODIUM CHLORIDE 0.9 % IV SOLN
40.0000 mg | Freq: Once | INTRAVENOUS | Status: DC
  Filled 2024-12-05: qty 4

## 2024-12-05 MED ORDER — DEXAMETHASONE 4 MG PO TABS
20.0000 mg | ORAL_TABLET | Freq: Once | ORAL | Status: AC
  Administered 2024-12-05: 14:00:00 20 mg via ORAL
  Filled 2024-12-05: qty 5

## 2024-12-05 MED ORDER — ONDANSETRON HCL 8 MG PO TABS: 8.0000 mg | ORAL_TABLET | Freq: Three times a day (TID) | ORAL | 1 refills | Status: DC | PRN

## 2024-12-05 MED ORDER — DEXAMETHASONE 4 MG PO TABS: ORAL_TABLET | ORAL | 11 refills | Status: DC

## 2024-12-05 MED ORDER — SODIUM CHLORIDE 0.9 % IV SOLN: INTRAVENOUS | Status: AC

## 2024-12-05 MED ORDER — DEXTROSE 5 % IV SOLN
20.0000 mg/m2 | Freq: Once | INTRAVENOUS | Status: AC
  Administered 2024-12-05: 15:00:00 40 mg via INTRAVENOUS
  Filled 2024-12-05: qty 5

## 2024-12-05 MED ORDER — ACYCLOVIR 400 MG PO TABS: 400.0000 mg | ORAL_TABLET | Freq: Every day | ORAL | 11 refills | Status: DC

## 2024-12-05 MED ORDER — LIDOCAINE-PRILOCAINE 2.5-2.5 % EX CREA: TOPICAL_CREAM | CUTANEOUS | 3 refills | Status: DC

## 2024-12-05 MED ORDER — SODIUM CHLORIDE 0.9 % IV SOLN: Freq: Once | INTRAVENOUS | Status: AC

## 2024-12-05 MED ORDER — PROCHLORPERAZINE MALEATE 10 MG PO TABS: 10.0000 mg | ORAL_TABLET | Freq: Four times a day (QID) | ORAL | 1 refills | Status: DC | PRN

## 2024-12-05 NOTE — Progress Notes (Unsigned)
 Patient presents today for C1D1 Kyprolis  chemotherapy infusion.  Patient is in satisfactory condition with no new complaints voiced.  Vital signs are stable.  Labs reviewed and all labs are within treatment parameters.  We will proceed with treatment per MD orders.    Treatment given today per MD orders. Tolerated infusion without adverse affects. Vital signs stable. No complaints at this time. Discharged from clinic ambulatory in stable condition. Alert and oriented x 3. F/U with [facility] as scheduled.

## 2024-12-05 NOTE — Telephone Encounter (Signed)
 Clinical Pharmacist Practitioner Encounter   Per patient, he is [facility]24.  Will get started once he has medication on hand.   Patient Education I spoke with patient for overview of new oral chemotherapy medication: Pomalyst  (pomalidomide ) for the treatment of multiple myeloma in conjunction with carfilzomib  and dexamethasone , planned duration, until patient is ready for CAR-T.    Wake note from 11/25/24 stated there was a required washout prior to lymphodepletion which is planned on 12/31/23, Pomalidomde and dexamethasone  washout of 7 days, so no doses after 12/23/24.   Treatment goal: Control  Counseled patient on administration, dosing, side effects, monitoring, drug-food interactions, safe handling, storage, and disposal. Patient will take 1 capsule (3 mg total) by mouth daily.  Until 12/24/2023.  **Patient understands 12/24/2023 is the last day of his pomalidomide  due to the washout period needed.  Side effects include but not limited to: Fatigue, constipation/diarrhea, rash.   Constipation: Patient reports that he is currently managing constipation with stool softener.  Reviewed with patient importance of keeping a medication schedule and plan for any missed doses.  After discussion with patient no patient barriers to medication adherence identified.   Distress evaluation: Distress thermometer not completed during telephone call as patient has been on previous lines of therapy.    Communication and Learning Assessment Primary learner: patient Barriers to learning: No barriers Preferred language: English Learning preferences: Listening Reading  Luis Stewart voiced understanding and appreciation. All questions answered. Medication handout provided.  Provided patient with Oral Chemotherapy Navigation Clinic phone number. Patient knows to call the office with questions or concerns. Reviewed with patient the expectations for rescheduling or  cancelling appointments.  Oral Chemotherapy Navigation Clinic will continue to follow.  Madelena Maturin N. Tyteanna Ost, PharmD, BCOP, CPP Hematology/Oncology Clinical Pharmacist [facility] Oral Chemotherapy Navigation Clinic [phone]  12/05/2024 4:13 PM

## 2024-12-05 NOTE — Patient Instructions (Signed)
 [facility] [place] - A DEPT OF . [facility]  Discharge Instructions: Thank you for choosing [facility] to provide your oncology and hematology care.  If you have a lab appointment with the Cancer Center - please note that after April 8th, 2024, all labs will be drawn in the cancer center.  You do not have to check in or register with the main entrance as you have in the past but will complete your check-in in the cancer center.  Wear comfortable clothing and clothing appropriate for easy access to any Portacath or PICC line.   We strive to give you quality time with your provider. You may need to reschedule your appointment if you arrive late (15 or more minutes).  Arriving late affects you and other patients whose appointments are after yours.  Also, if you miss three or more appointments without notifying the office, you may be dismissed from the clinic at the providers discretion.      For prescription refill requests, have your pharmacy contact our office and allow 72 hours for refills to be completed.    Today you received the following chemotherapy and/or immunotherapy agents C1D1 Kyprolis       To help prevent nausea and vomiting after your treatment, we encourage you to take your nausea medication as directed.  BELOW ARE SYMPTOMS THAT SHOULD BE REPORTED IMMEDIATELY: *FEVER GREATER THAN 100.4 F (38 C) OR HIGHER *CHILLS OR SWEATING *NAUSEA AND VOMITING THAT IS NOT CONTROLLED WITH YOUR NAUSEA MEDICATION *UNUSUAL SHORTNESS OF BREATH *UNUSUAL BRUISING OR BLEEDING *URINARY PROBLEMS (pain or burning when urinating, or frequent urination) *BOWEL PROBLEMS (unusual diarrhea, constipation, pain near the anus) TENDERNESS IN MOUTH AND THROAT WITH OR WITHOUT PRESENCE OF ULCERS (sore throat, sores in mouth, or a toothache) UNUSUAL RASH, SWELLING OR PAIN  UNUSUAL VAGINAL DISCHARGE OR ITCHING   Items with * indicate a potential emergency and should be  followed up as soon as possible or go to the Emergency Department if any problems should occur.  Please show the CHEMOTHERAPY ALERT CARD or IMMUNOTHERAPY ALERT CARD at check-in to the Emergency Department and triage nurse.  Should you have questions after your visit or need to cancel or reschedule your appointment, please contact [facility] [place] - A DEPT OF [facility] [phone]  and follow the prompts.  Office hours are 8:00 a.m. to 4:30 p.m. Monday - Friday. Please note that voicemails left after 4:00 p.m. may not be returned until the following business day.  We are closed weekends and major holidays. You have access to a nurse at all times for urgent questions. Please call the main number to the clinic [phone] and follow the prompts.  For any non-urgent questions, you may also contact your provider using MyChart. We now offer e-Visits for anyone 30 and older to request care online for non-urgent symptoms. For details visit [email or web address].   Also download the MyChart app! Go to the app store, search MyChart, open the app, select Reader, and log in with your MyChart username and password.

## 2024-12-05 NOTE — Progress Notes (Unsigned)
 Pharmacist Chemotherapy Monitoring - Initial Assessment    Anticipated start date: ***   The following has been reviewed per standard work regarding the patient's treatment regimen: The patient's diagnosis, treatment plan and drug doses, and organ/hematologic function Lab orders and baseline tests specific to treatment regimen  The treatment plan start date, drug sequencing, and pre-medications Prior authorization status  Patient's documented medication list, including drug-drug interaction screen and prescriptions for anti-emetics and supportive care specific to the treatment regimen The drug concentrations, fluid compatibility, administration routes, and timing of the medications to be used The patient's access for treatment and lifetime cumulative dose history, if applicable  The patient's medication allergies and previous infusion related reactions, if applicable   Changes made to treatment plan:  N/A   Orders from [facility]:    Wash out period prior to CAR T- lymphodepletion planned on 12/31/23 is:  - Carfilzomib : 14 days (no doses after 12/16/24) - Pomalyst  and Dexamethasone : 7 days - no doses after 12/23/2024  Follow up needed:  N/A   Niels FORBES Molt, [facility], 12/05/2024  1:35 PM

## 2024-12-09 ENCOUNTER — Emergency Department (HOSPITAL_COMMUNITY)

## 2024-12-09 ENCOUNTER — Observation Stay (HOSPITAL_COMMUNITY)
Admission: EM | Admit: 2024-12-09 | Discharge: 2024-12-11 | Disposition: A | Attending: Internal Medicine | Admitting: Internal Medicine

## 2024-12-09 DIAGNOSIS — I700 Atherosclerosis of aorta: Secondary | ICD-10-CM | POA: Insufficient documentation

## 2024-12-09 DIAGNOSIS — D509 Iron deficiency anemia, unspecified: Secondary | ICD-10-CM | POA: Diagnosis not present

## 2024-12-09 DIAGNOSIS — C7951 Secondary malignant neoplasm of bone: Secondary | ICD-10-CM | POA: Insufficient documentation

## 2024-12-09 DIAGNOSIS — E785 Hyperlipidemia, unspecified: Secondary | ICD-10-CM | POA: Diagnosis not present

## 2024-12-09 DIAGNOSIS — Z86711 Personal history of pulmonary embolism: Secondary | ICD-10-CM | POA: Diagnosis not present

## 2024-12-09 DIAGNOSIS — K689 Other disorders of retroperitoneum: Principal | ICD-10-CM | POA: Insufficient documentation

## 2024-12-09 DIAGNOSIS — G35D Multiple sclerosis, unspecified: Secondary | ICD-10-CM | POA: Diagnosis not present

## 2024-12-09 DIAGNOSIS — C61 Malignant neoplasm of prostate: Secondary | ICD-10-CM | POA: Diagnosis not present

## 2024-12-09 DIAGNOSIS — C9000 Multiple myeloma not having achieved remission: Secondary | ICD-10-CM | POA: Diagnosis not present

## 2024-12-09 DIAGNOSIS — R1900 Intra-abdominal and pelvic swelling, mass and lump, unspecified site: Secondary | ICD-10-CM | POA: Diagnosis present

## 2024-12-09 DIAGNOSIS — K219 Gastro-esophageal reflux disease without esophagitis: Secondary | ICD-10-CM | POA: Diagnosis present

## 2024-12-09 DIAGNOSIS — R197 Diarrhea, unspecified: Secondary | ICD-10-CM | POA: Diagnosis not present

## 2024-12-09 DIAGNOSIS — K56609 Unspecified intestinal obstruction, unspecified as to partial versus complete obstruction: Secondary | ICD-10-CM | POA: Diagnosis present

## 2024-12-09 DIAGNOSIS — R109 Unspecified abdominal pain: Secondary | ICD-10-CM | POA: Diagnosis present

## 2024-12-09 LAB — CBC WITH DIFFERENTIAL/PLATELET
Abs Immature Granulocytes: 0.01 K/uL (ref 0.00–0.07)
Basophils Absolute: 0 K/uL (ref 0.0–0.1)
Basophils Relative: 0 %
Eosinophils Absolute: 0 K/uL (ref 0.0–0.5)
Eosinophils Relative: 0 %
HCT: 34.7 % — ABNORMAL LOW (ref 39.0–52.0)
Hemoglobin: 11.7 g/dL — ABNORMAL LOW (ref 13.0–17.0)
Immature Granulocytes: 0 %
Lymphocytes Relative: 6 %
Lymphs Abs: 0.4 K/uL — ABNORMAL LOW (ref 0.7–4.0)
MCH: 33.6 pg (ref 26.0–34.0)
MCHC: 33.7 g/dL (ref 30.0–36.0)
MCV: 99.7 fL (ref 80.0–100.0)
Monocytes Absolute: 0.5 K/uL (ref 0.1–1.0)
Monocytes Relative: 8 %
Neutro Abs: 5.4 K/uL (ref 1.7–7.7)
Neutrophils Relative %: 86 %
Platelets: 145 K/uL — ABNORMAL LOW (ref 150–400)
RBC: 3.48 MIL/uL — ABNORMAL LOW (ref 4.22–5.81)
RDW: 15.6 % — ABNORMAL HIGH (ref 11.5–15.5)
WBC: 6.3 K/uL (ref 4.0–10.5)
nRBC: 0 % (ref 0.0–0.2)

## 2024-12-09 LAB — COMPREHENSIVE METABOLIC PANEL WITH GFR
ALT: 32 U/L (ref 0–44)
AST: 20 U/L (ref 15–41)
Albumin: 3.9 g/dL (ref 3.5–5.0)
Alkaline Phosphatase: 42 U/L (ref 38–126)
Anion gap: 6 (ref 5–15)
BUN: 19 mg/dL (ref 8–23)
CO2: 28 mmol/L (ref 22–32)
Calcium: 8.8 mg/dL — ABNORMAL LOW (ref 8.9–10.3)
Chloride: 104 mmol/L (ref 98–111)
Creatinine, Ser: 1.09 mg/dL (ref 0.61–1.24)
GFR, Estimated: >60 mL/min
Glucose, Bld: 105 mg/dL — ABNORMAL HIGH (ref 70–99)
Potassium: 3.8 mmol/L (ref 3.5–5.1)
Sodium: 138 mmol/L (ref 135–145)
Total Bilirubin: 0.6 mg/dL (ref 0.0–1.2)
Total Protein: 6.7 g/dL (ref 6.5–8.1)

## 2024-12-09 LAB — LIPASE, BLOOD: Lipase: 42 U/L (ref 11–51)

## 2024-12-09 LAB — URINALYSIS, W/ REFLEX TO CULTURE (INFECTION SUSPECTED)
Bacteria, UA: NONE SEEN
Bilirubin Urine: NEGATIVE
Glucose, UA: NEGATIVE mg/dL
Ketones, ur: NEGATIVE mg/dL
Leukocytes,Ua: NEGATIVE
Nitrite: NEGATIVE
Protein, ur: NEGATIVE mg/dL
Specific Gravity, Urine: 1.011 (ref 1.005–1.030)
pH: 5 (ref 5.0–8.0)

## 2024-12-09 MED ORDER — IOHEXOL 300 MG/ML  SOLN
100.0000 mL | Freq: Once | INTRAMUSCULAR | Status: AC | PRN
  Administered 2024-12-09: 100 mL via INTRAVENOUS

## 2024-12-09 MED ORDER — ONDANSETRON HCL 4 MG/2ML IJ SOLN: 4.0000 mg | Freq: Once | INTRAMUSCULAR | Status: DC

## 2024-12-09 MED ORDER — SODIUM CHLORIDE 0.9 % IV BOLUS
1000.0000 mL | Freq: Once | INTRAVENOUS | Status: AC
  Administered 2024-12-09: 1000 mL via INTRAVENOUS

## 2024-12-09 MED ORDER — HYDROMORPHONE HCL 1 MG/ML IJ SOLN
0.5000 mg | Freq: Once | INTRAMUSCULAR | Status: AC
  Administered 2024-12-10: 0.5 mg via INTRAVENOUS
  Filled 2024-12-09: qty 0.5

## 2024-12-09 NOTE — ED Notes (Signed)
 Patient transported to CT

## 2024-12-09 NOTE — ED Triage Notes (Addendum)
 Pt comes in for abd pain and nausea since today. Pain is mostly on the left side. Pt is not able to eat a lot. Pt denies throwing up. Pt receives chemo for cancer, last txt was Tuesday. Pt called his oncologist nurse and they told him to come here since he is having pain. A&Ox4.   Pt has diarrhea, 5-6 BM in past 24 hrs.

## 2024-12-09 NOTE — ED Provider Notes (Signed)
 Care of patient assumed from Dr. Francesca.  This patient presented with lower abdominal pain and diarrhea.  Tenderness was noted on exam.  CT imaging is pending. Physical Exam  BP 117/73   Pulse 67   Temp 99.5 F (37.5 C) (Oral)   Resp 17   Ht 6' 2 (1.88 m)   Wt 81.2 kg   SpO2 95%   BMI 22.98 kg/m   Physical Exam Vitals and nursing note reviewed.  Constitutional:      General: He is not in acute distress.    Appearance: He is well-developed. He is not ill-appearing, toxic-appearing or diaphoretic.  HENT:     Head: Normocephalic and atraumatic.  Eyes:     Conjunctiva/sclera: Conjunctivae normal.  Cardiovascular:     Rate and Rhythm: Normal rate and regular rhythm.  Pulmonary:     Effort: Pulmonary effort is normal. No respiratory distress.  Abdominal:     Palpations: Abdomen is soft.     Tenderness: There is abdominal tenderness in the left upper quadrant and left lower quadrant. There is no guarding or rebound.  Musculoskeletal:        General: No swelling.     Cervical back: Neck supple.  Skin:    General: Skin is warm and dry.     Coloration: Skin is not cyanotic or jaundiced.  Neurological:     General: No focal deficit present.     Mental Status: He is alert and oriented to person, place, and time.  Psychiatric:        Mood and Affect: Mood normal.        Behavior: Behavior normal.     Procedures  Procedures  ED Course / MDM    Medical Decision Making Amount and/or Complexity of Data Reviewed Labs: ordered. Radiology: ordered.  Risk Prescription drug management.   Per chart review, this patient's medical history includes HLD, prostate cancer, PE, GERD, multiple myeloma, anemia.  He is currently getting monthly Xgeva  and every 6 month Eligard .  He started on pomalidomide  yesterday.  Earlier this week he had constipation and he took MiraLAX  and Dulcolax on Tuesday.  He had a soft bowel movement on Wednesday and then developed diarrhea last night.  He has  had approximately 5 watery stools.  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  On exam, patient endorsing ongoing left-sided abdominal pain.  This does extend from lower quadrant to upper quadrant.  He has had nausea without vomiting.  NG tube ordered.  I spoke with general surgeon on-call, Dr. Kallie.  Dr. Kallie recommends admission to [facility] for observation.  Patient was admitted for further management.      Melvenia Motto, MD 12/10/24 [phone]

## 2024-12-09 NOTE — ED Provider Notes (Signed)
 " [place] EM[facility]MENT AT [facility] Provider Note  CSN: [id number] Arrival date & time: 12/09/24 2034  Chief Complaint(s) Abdominal Pain and Nausea  HPI Luis Stewart is a 73 y.o. male history of multiple myeloma on chemotherapy presenting with abdominal pain.  He reports that his abdominal pain began today.  Reports associated left lower quadrant pain, diarrhea.  Reports symptoms began today.  No hematochezia, melena.  No vomiting, reports some nausea.  No fevers.  No sick contacts.  No recent travel or antibiotic use.  No recent hospitalization.   Past Medical History Past Medical History:  Diagnosis Date   Arthritis    Asbestosis (HCC)    Closed fracture of distal end of fibula with tibia with routine healing 05/28/2020   COVID-19 09/03/2021   Cyst of kidney, acquired    Cystic disease of liver    Enlarged prostate    Headache    History of fracture of leg    Right, childhood   Hx of migraine headaches    Hyperlipidemia    Multiple myeloma (HCC)    Neuropathy    PE (pulmonary thromboembolism) (HCC)    After knee surgery   Prostate cancer (HCC) 2015   Adenocarcinoma by biopsy   Patient Active Problem List   Diagnosis Date Noted   Acute bacterial bronchitis 09/06/2024   Iron deficiency anemia 08/25/2024   Encounter for general adult medical examination with abnormal findings 02/19/2024   PSVT (paroxysmal supraventricular tachycardia) 02/19/2024   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   Change in bowel function 08/11/2023   Prediabetes 02/18/2023   Idiopathic peripheral neuropathy 02/18/2023   Autologous donor of stem cells 11/25/2022   H/O urinary retention 04/30/2022   Benign prostatic hyperplasia with urinary frequency 04/30/2022   Multiple myeloma (HCC) 04/09/2022   Hyperproteinemia 02/12/2022   Lumbar radiculopathy 02/28/2021   Closed fracture of left distal fibula 04/18/2020   ED (erectile  dysfunction) 05/11/2019   Atypical chest pain 10/25/2018   History of DVT (deep vein thrombosis) 04/16/2018   GERD (gastroesophageal reflux disease) 10/15/2017   Pulmonary nodule 07/19/2017   Constipation 07/15/2017   History of pulmonary embolism 06/18/2017   Primary localized osteoarthritis of right knee 06/10/2017   Prostate cancer (HCC) 08/17/2015   Exposure to asbestos 08/17/2015   History of colonic polyps 08/08/2014   Vitamin D  deficiency 06/05/2014   Hyperlipidemia 06/01/2013   Pre-transplant evaluation for stem cell transplant 06/01/2013   Home Medication(s) Prior to Admission medications  Medication Sig Start Date End Date Taking? Authorizing Provider  acetaminophen  (TYLENOL ) 500 MG tablet Take 1,000 mg by mouth every 6 (six) hours as needed for mild pain or moderate pain.    [provider]  acyclovir  (ZOVIRAX ) 400 MG tablet TAKE 1 TABLET BY MOUTH TWICE A DAY 03/30/24   [name], MD  acyclovir  (ZOVIRAX ) 400 MG tablet Take 1 tablet (400 mg total) by mouth daily. 12/05/24   Davonna Siad, MD  albuterol  (VENTOLIN  HFA) 108 (90 Base) MCG/ACT inhaler Inhale 2 puffs into the lungs every 6 (six) hours as needed for wheezing or shortness of breath. 09/06/24   [name], MD  Cholecalciferol (VITAMIN D ) 2000 units CAPS Take 2,000 Units by mouth every other day.    [provider]  dexamethasone  (DECADRON ) 4 MG tablet Take 20 mg 30 minutes prior to treatment 11/08/24   Kandala, Hyndavi, MD  dexamethasone  (DECADRON ) 4 MG tablet Take 5 tablets (20 mg) for 1  day on the week after chemotherapy (day 22). 12/05/24   Davonna Siad, MD  DULoxetine  (CYMBALTA ) 60 MG capsule TAKE 1 CAPSULE BY MOUTH EVERY DAY 11/29/24   Patel, Rutwik K, MD  ferrous sulfate 325 (65 FE) MG EC tablet Take 325 mg by mouth daily.    [provider]  guaiFENesin -codeine  100-10 MG/5ML syrup Take 5 mLs by mouth 3 (three) times daily as needed for cough. 09/06/24   [name],  MD  lidocaine -prilocaine  (EMLA ) cream Apply to affected area once 12/05/24   Kandala, Hyndavi, MD  Multiple Vitamin (MULTIVITAMIN WITH MINERALS) TABS tablet Take 1 tablet by mouth daily with breakfast. Centrum Silver for Men    [provider]  nitroGLYCERIN  (NITROSTAT ) 0.4 MG SL tablet Place 1 tablet (0.4 mg total) under the tongue every 5 (five) minutes x 3 doses as needed for chest pain (If no relief after 3rd dose, call or go to ED.). 11/20/22   Miriam Norris, NP  ondansetron  (ZOFRAN ) 8 MG tablet Take 1 tablet (8 mg total) by mouth every 8 (eight) hours as needed for nausea or vomiting. 11/25/24   Geofm Delon BRAVO, NP  ondansetron  (ZOFRAN ) 8 MG tablet Take 1 tablet (8 mg total) by mouth every 8 (eight) hours as needed for nausea or vomiting. 12/05/24   Davonna Siad, MD  pantoprazole  (PROTONIX ) 40 MG tablet TAKE 1 TABLET BY MOUTH EVERY OTHER DAY 07/25/24   [name], MD  pomalidomide  (POMALYST ) 3 MG capsule Take 1 capsule (3 mg total) by mouth daily. 21 days on, 7 days off. 12/02/24   Davonna Siad, MD  potassium chloride  (KLOR-CON ) 10 MEQ tablet Take 1 tablet (10 mEq total) by mouth daily. 09/22/24 12/21/24  Kandala, Hyndavi, MD  prochlorperazine  (COMPAZINE ) 10 MG tablet Take 1 tablet (10 mg total) by mouth every 6 (six) hours as needed for nausea or vomiting. 12/05/24   Davonna Siad, MD  RESTASIS 0.05 % ophthalmic emulsion Place 1 drop into both eyes 2 (two) times daily. 10/18/23   [provider]  rosuvastatin  (CRESTOR ) 5 MG tablet TAKE 1 TABLET (5 MG TOTAL) BY MOUTH DAILY. 03/30/24 10/05/25  [name], MD                                                                                                                                    Past Surgical History Past Surgical History:  Procedure Laterality Date   BIOPSY PROSTATE  2003 and 2005   COLONOSCOPY  11/28/2008   Dr. Rourk:internal hemorrhoids/tortus colon/ascending colon polyps, tubular adenomas    COLONOSCOPY N/A 09/04/2014   Procedure: COLONOSCOPY;  Surgeon: Lamar CHRISTELLA Hollingshead, MD;  [facility]: [facility];  Service: Endoscopy;  Laterality: N/A;  9:30   COLONOSCOPY N/A 07/17/2017   Procedure: COLONOSCOPY;  Surgeon: Harvey Margo CROME, MD;  Location: [facility];  Service: Endoscopy;  Laterality: N/A;   COLONOSCOPY WITH PROPOFOL  N/A 10/05/2023  Procedure: COLONOSCOPY WITH PROPOFOL ;  Surgeon: [name], MD;  Location: [facility];  Service: Endoscopy;  Laterality: N/A;  10:00AM, ASA 3   ESOPHAGOGASTRODUODENOSCOPY N/A 07/16/2017   Procedure: ESOPHAGOGASTRODUODENOSCOPY (EGD);  Surgeon: Harvey Margo CROME, MD;  Location: [facility];  Service: Endoscopy;  Laterality: N/A;   GIVENS CAPSULE STUDY  07/16/2017   Procedure: GIVENS CAPSULE STUDY;  Surgeon: Harvey Margo CROME, MD;  [facility]: [facility];  Service: Endoscopy;;   JOINT REPLACEMENT N/A    Phreesia 01/12/2021   POLYPECTOMY  07/17/2017   Procedure: POLYPECTOMY;  Surgeon: Harvey Margo CROME, MD;  Location: [facility];  Service: Endoscopy;;  cecal   POLYPECTOMY  10/05/2023   Procedure: POLYPECTOMY;  Surgeon: [name], MD;  Location: [facility];  Service: Endoscopy;;   PORTACATH PLACEMENT Left 04/18/2022   Procedure: INSERTION PORT-A-CATH;  Surgeon: Mavis Anes, MD;  Location: [facility];  Service: General;  Laterality: Left;   TOTAL KNEE ARTHROPLASTY Right 06/10/2017   TOTAL KNEE ARTHROPLASTY Right 06/10/2017   Procedure: TOTAL KNEE ARTHROPLASTY;  Surgeon: Beverley Toribio FALCON, MD;  Location: [facility] OR;  Service: Orthopedics;  Laterality: Right;   Family History Family History  Problem Relation Age of Onset   Diabetes Mother    Hypertension Mother    Obesity Mother    Heart disease Mother    Mental illness Mother    Hypertension Sister    Obesity Sister    Arthritis Sister    Deep vein thrombosis Father    Dementia Father    Heart disease Father        CABG   Colon cancer Neg Hx     Social History Social  History[1] Allergies Sulfa antibiotics  Review of Systems Review of Systems  Physical Exam Vital Signs  I have reviewed the triage vital signs BP 117/73   Pulse 65   Temp 99.5 F (37.5 C) (Oral)   Resp 17   Ht 6' 2 (1.88 m)   Wt 81.2 kg   SpO2 99%   BMI 22.98 kg/m  Physical Exam Vitals and nursing note reviewed.  Constitutional:      General: He is not in acute distress.    Appearance: Normal appearance.  HENT:     Mouth/Throat:     Mouth: Mucous membranes are moist.  Eyes:     Conjunctiva/sclera: Conjunctivae normal.  Cardiovascular:     Rate and Rhythm: Normal rate and regular rhythm.  Pulmonary:     Effort: Pulmonary effort is normal. No respiratory distress.     Breath sounds: Normal breath sounds.  Abdominal:     General: Abdomen is flat.     Palpations: Abdomen is soft.     Tenderness: There is abdominal tenderness in the left lower quadrant.  Musculoskeletal:     Right lower leg: No edema.     Left lower leg: No edema.  Skin:    General: Skin is warm and dry.     Capillary Refill: Capillary refill takes less than 2 seconds.  Neurological:     Mental Status: He is alert and oriented to person, place, and time. Mental status is at baseline.  Psychiatric:        Mood and Affect: Mood normal.        Behavior: Behavior normal.     ED Results and Treatments Labs (all labs ordered are listed, but only abnormal results are displayed) Labs Reviewed  CBC WITH DIFFERENTIAL/PLATELET - Abnormal; Notable for the following  components:      Result Value   RBC 3.48 (*)    Hemoglobin 11.7 (*)    HCT 34.7 (*)    RDW 15.6 (*)    Platelets 145 (*)    Lymphs Abs 0.4 (*)    All other components within normal limits  COMPREHENSIVE METABOLIC PANEL WITH GFR - Abnormal; Notable for the following components:   Glucose, Bld 105 (*)    Calcium  8.8 (*)    All other components within normal limits  URINALYSIS, W/ REFLEX TO CULTURE (INFECTION SUSPECTED) - Abnormal;  Notable for the following components:   Color, Urine STRAW (*)    Hgb urine dipstick SMALL (*)    All other components within normal limits  C DIFFICILE QUICK SCREEN W PCR REFLEX    LIPASE, BLOOD                                                                                                                          Radiology No results found.  Pertinent labs & imaging results that were available during my care of the patient were reviewed by me and considered in my medical decision making (see MDM for details).  Medications Ordered in ED Medications  ondansetron  (ZOFRAN ) injection 4 mg (0 mg Intravenous Hold 12/09/24 2237)  HYDROmorphone  (DILAUDID ) injection 0.5 mg (0 mg Intravenous Hold 12/09/24 2237)  iohexol  (OMNIPAQUE ) 300 MG/ML solution 100 mL (has no administration in time range)  sodium chloride  0.9 % bolus 1,000 mL (1,000 mLs Intravenous New Bag/Given 12/09/24 2236)                                                                                                                                     Procedures Procedures  (including critical care time)  Medical Decision Making / ED Course   MDM:  73 year old presenting to the emergency room with abdominal pain, diarrhea.  Patient overall well-appearing, physical examination with left lower quadrant tenderness.  Differential includes diverticulitis, colitis, perforation, volvulus, abscess, C. difficile, will check labs including C. difficile testing if patient able to provide stool sample.  Will also obtain CT abdomen to further evaluate.  Patient is overall well-appearing.  He declines any pain medication or nausea medication at this time.     Additional history obtained: -Additional history obtained from family -External records from outside source obtained and reviewed including: Chart review including previous notes,  labs, imaging, consultation notes including prior notes    Lab Tests: -I ordered, reviewed, and  interpreted labs.   The pertinent results include:   Labs Reviewed  CBC WITH DIFFERENTIAL/PLATELET - Abnormal; Notable for the following components:      Result Value   RBC 3.48 (*)    Hemoglobin 11.7 (*)    HCT 34.7 (*)    RDW 15.6 (*)    Platelets 145 (*)    Lymphs Abs 0.4 (*)    All other components within normal limits  COMPREHENSIVE METABOLIC PANEL WITH GFR - Abnormal; Notable for the following components:   Glucose, Bld 105 (*)    Calcium  8.8 (*)    All other components within normal limits  URINALYSIS, W/ REFLEX TO CULTURE (INFECTION SUSPECTED) - Abnormal; Notable for the following components:   Color, Urine STRAW (*)    Hgb urine dipstick SMALL (*)    All other components within normal limits  C DIFFICILE QUICK SCREEN W PCR REFLEX    LIPASE, BLOOD    Notable for mild anemia    Medicines ordered and prescription drug management: Meds ordered this encounter  Medications   sodium chloride  0.9 % bolus 1,000 mL   ondansetron  (ZOFRAN ) injection 4 mg   HYDROmorphone  (DILAUDID ) injection 0.5 mg   iohexol  (OMNIPAQUE ) 300 MG/ML solution 100 mL    -I have reviewed the patients home medicines and have made adjustments as needed  Social Determinants of Health:  Diagnosis or treatment significantly limited by social determinants of health: obesity   Reevaluation: After the interventions noted above, I reevaluated the patient and found that their symptoms have improved  Co morbidities that complicate the patient evaluation  Past Medical History:  Diagnosis Date   Arthritis    Asbestosis (HCC)    Closed fracture of distal end of fibula with tibia with routine healing 05/28/2020   COVID-19 09/03/2021   Cyst of kidney, acquired    Cystic disease of liver    Enlarged prostate    Headache    History of fracture of leg    Right, childhood   Hx of migraine headaches    Hyperlipidemia    Multiple myeloma (HCC)    Neuropathy    PE (pulmonary thromboembolism) (HCC)     After knee surgery   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    Final Clinical Impression(s) / ED Diagnoses Final diagnoses:  Diarrhea, unspecified type     This chart was dictated using voice recognition software.  Despite best efforts to proofread,  errors can occur which can change the documentation meaning.     [1]  Social History Tobacco Use   Smoking status: Never   Smokeless tobacco: Never  Vaping Use   Vaping status: Never Used  Substance Use Topics   Alcohol use: No   Drug use: No     Francesca Elsie CROME, MD 12/09/24 2333  "

## 2024-12-10 ENCOUNTER — Other Ambulatory Visit: Payer: Self-pay

## 2024-12-10 ENCOUNTER — Emergency Department (HOSPITAL_COMMUNITY)

## 2024-12-10 DIAGNOSIS — C9000 Multiple myeloma not having achieved remission: Secondary | ICD-10-CM

## 2024-12-10 DIAGNOSIS — R1900 Intra-abdominal and pelvic swelling, mass and lump, unspecified site: Secondary | ICD-10-CM | POA: Diagnosis present

## 2024-12-10 DIAGNOSIS — K219 Gastro-esophageal reflux disease without esophagitis: Secondary | ICD-10-CM

## 2024-12-10 DIAGNOSIS — C7951 Secondary malignant neoplasm of bone: Secondary | ICD-10-CM | POA: Diagnosis not present

## 2024-12-10 DIAGNOSIS — D509 Iron deficiency anemia, unspecified: Secondary | ICD-10-CM

## 2024-12-10 DIAGNOSIS — E782 Mixed hyperlipidemia: Secondary | ICD-10-CM

## 2024-12-10 DIAGNOSIS — C61 Malignant neoplasm of prostate: Secondary | ICD-10-CM | POA: Diagnosis not present

## 2024-12-10 DIAGNOSIS — K56609 Unspecified intestinal obstruction, unspecified as to partial versus complete obstruction: Secondary | ICD-10-CM | POA: Diagnosis present

## 2024-12-10 DIAGNOSIS — Z86711 Personal history of pulmonary embolism: Secondary | ICD-10-CM

## 2024-12-10 MED ORDER — PHENOL 1.4 % MT LIQD: 1.0000 | OROMUCOSAL | Status: DC | PRN

## 2024-12-10 MED ORDER — PANTOPRAZOLE SODIUM 40 MG IV SOLR
40.0000 mg | INTRAVENOUS | Status: DC
  Administered 2024-12-10 – 2024-12-11 (×2): 40 mg via INTRAVENOUS
  Filled 2024-12-10 (×2): qty 10

## 2024-12-10 MED ORDER — ACETAMINOPHEN 325 MG PO TABS: 650.0000 mg | ORAL_TABLET | Freq: Four times a day (QID) | ORAL | Status: DC | PRN

## 2024-12-10 MED ORDER — LACTATED RINGERS IV SOLN: INTRAVENOUS | Status: DC

## 2024-12-10 MED ORDER — HYDROMORPHONE HCL 1 MG/ML IJ SOLN: 0.5000 mg | INTRAMUSCULAR | Status: DC | PRN

## 2024-12-10 MED ORDER — ONDANSETRON HCL 4 MG/2ML IJ SOLN: 4.0000 mg | Freq: Four times a day (QID) | INTRAMUSCULAR | Status: DC | PRN

## 2024-12-10 MED ORDER — ONDANSETRON HCL 4 MG PO TABS: 4.0000 mg | ORAL_TABLET | Freq: Four times a day (QID) | ORAL | Status: DC | PRN

## 2024-12-10 MED ORDER — ACETAMINOPHEN 650 MG RE SUPP: 650.0000 mg | Freq: Four times a day (QID) | RECTAL | Status: DC | PRN

## 2024-12-10 MED ORDER — ENOXAPARIN SODIUM 40 MG/0.4ML IJ SOSY
40.0000 mg | PREFILLED_SYRINGE | INTRAMUSCULAR | Status: DC
  Administered 2024-12-10 – 2024-12-11 (×2): 40 mg via SUBCUTANEOUS
  Filled 2024-12-10 (×2): qty 0.4

## 2024-12-10 MED ORDER — DEXTROSE-SODIUM CHLORIDE 5-0.45 % IV SOLN: INTRAVENOUS | Status: DC

## 2024-12-10 NOTE — Assessment & Plan Note (Signed)
-   Patient is no longer on blood thinners - Lovenox  will be used for DVT prophylaxis.

## 2024-12-10 NOTE — Assessment & Plan Note (Signed)
-  Continue outpatient follow-up with oncology service. 

## 2024-12-10 NOTE — Assessment & Plan Note (Signed)
-   Planning to resume statin when tolerating by mouth and NG tube removed.

## 2024-12-10 NOTE — Assessment & Plan Note (Signed)
-   Continue patient follow-up with oncology service.

## 2024-12-10 NOTE — Progress Notes (Addendum)
 Patient arrived to room. VSS. Alert and oriented x4. Patient low fall risk.

## 2024-12-10 NOTE — Plan of Care (Signed)
   Problem: Education: Goal: Knowledge of General Education information will improve Description Including pain rating scale, medication(s)/side effects and non-pharmacologic comfort measures Outcome: Progressing   Problem: Health Behavior/Discharge Planning: Goal: Ability to manage health-related needs will improve Outcome: Progressing

## 2024-12-10 NOTE — Progress Notes (Signed)
 "  Progress Note   Patient: Luis Stewart FMW:[id number] DOB: 1951/09/17 DOA: 12/09/2024     0 DOS: the patient was seen and examined on 12/10/2024   Brief hospital admission narrative: As per H&P written by Dr. Lorren On 12/10/2024 Luis Stewart is a 73 y.o. male with medical history significant of multiple myeloma, prostate cancer, iron deficiency anemia, paroxysmal supraventricular tachycardia, GERD and hyperlipidemia presented to the hospital with complaint of abdominal pain and diarrhea.  Cording to the patient he is scheduled to undergo CAR-T treatment.  During the interim patient is supposed to be taking cycles of chemotherapy with Pomalyst  and dexamethasone  for 7 days along with carfilzomib  for 14 days.  This cycle is scheduled to washout before he does the CAR-T infusion. Patient had treatment on the 15th of this month.  States that he was having constipation at home.  He started taking MiraLAX  in addition to Dulcolax on Tuesday and then he said he finally had a bowel movement on Wednesday.  However subsequent to the bowel movement he started having diarrhea.  He reports its been persistent along with the left-sided abdominal pain.  He called his oncologist to let him know that he has been having a lot of gas and indigestion with that new chemo medication,pomalyst .  He is advised to push fluids, add Gatorade or Pedialyte and then call the clinic if he develops any fevers of 100.4 or any worsening symptoms.  Patient however was not improving therefore presented to the hospital for further evaluation and treatment today.   ED Course:  In the ER, BP 121/67, HR 69, RR 18, O2 saturation 6% on room air,  and Tmax 9.5. Cbc demonstrated wbc 6.3,  hb/hct 11.7/34.7, and platelet 145. Chemistry demonstrated Na 138, K 3.8, Cl 104, bicarb 28, Bun/Cr 19/1.09 and glucose 105.   CXR demonstrated left chest wall port with the tip in the mid SVC and subdiaphragmatic enteric tube..  Urinalysis negative for acute infection such as UTI. EKG was not performed.   Assessment and plan Assessment & Plan Retroperitoneal mass - Unclear etiology at the moment - For now supportive care and anticipated discussion with oncology service (if is stable most likely as an outpatient). -No needing surgical intervention at the moment. -NG tube in place with ongoing nausea; this will assist with gastric and duodenal decompression. - PPI for prophylaxis will be provided. Hyperlipidemia - Planning to resume statin when tolerating by mouth and NG tube removed. Prostate cancer [facility]) - Continue outpatient follow-up with oncology service. History of pulmonary embolism - Patient is no longer on blood thinners - Lovenox  will be used for DVT prophylaxis. GERD (gastroesophageal reflux disease) - Continue PPI. Multiple myeloma (HCC) - Continue patient follow-up with oncology service. Iron deficiency anemia - Planning to resume iron supplementations when tolerated by mouth.   Subjective:  NG tube in place; reports feeling slightly better.  Afebrile, no chest pain, no nausea, no vomiting.  Some sore throat discomfort has been reported due to NG.  Physical Exam: Vitals:   12/10/24 0630 12/10/24 0640 12/10/24 0800 12/10/24 0924  BP:  125/65 128/74 131/70  Pulse: 60 61 63 65  Resp:  17 (!) 22 (!) 27  Temp:   98.2 F (36.8 C)   TempSrc:      SpO2: 98% 98% 98% 99%  Weight:      Height:       General exam: Alert, awake, oriented x 3; NG tube in place.  Afebrile and in  no acute distress. Respiratory system: Clear to auscultation. Respiratory effort normal.  Good saturation on room air. Cardiovascular system:RRR.  No rubs, no gallops, no JVD. Gastrointestinal system: Abdomen is nondistended, soft and without guarding.  Positive bowel sounds appreciated on exam. Central nervous system: No focal neurological deficits. Extremities: No C/C/E, +pedal pulses Skin: No rashes, lesions or  ulcers Psychiatry: Judgement and insight appear normal. Mood & affect appropriate.    Data Reviewed: Lipase: 42 Comprehensive metabolic panel: Sodium 138, potassium 3.8, chloride 104, bicarb 28, BUN 19, creatinine 1.09, normal LFTs and GFR >60 CBC: White blood cell 6.3, hemoglobin 11.7 and platelet count 145K  Family Communication: Wife at bedside.  Disposition: Status is: Inpatient Remains inpatient appropriate because: Continue IV  therapy  Time spent: 50 minutes  Author: Eric Nunnery, MD 12/10/2024 10:11 AM  For on call review [email or web address].    "

## 2024-12-10 NOTE — Progress Notes (Addendum)
 [facility]  Patient coming into Ed with abdominal pain and diarrhea. CT can demonstrating a new retroperitoneal mass that is abutting the 2nd-3rd portion of duodenum, pancreas, and is encasing the right renal vessels.  He has a history of prostate cancer with skeletal metastasis on Eligard  every 6 months and multiple myeloma on Daratumumab  + Revlimid  at [facility] but he does follow with Dr. Davonna at [facility].  I reviewed the CT scan and his CT is demonstrating more gastric distention and duodenal dilation. He is not vomiting currently as he came in with diarrhea. No small bowel dilation noted.    This mass seems to be new from his last PET scan report at [facility] 09/2024.   He could develop more gastric outlet from this mass but currently it does not sound like he is having that issue. He has telephone calls in to Oncology regarding nausea controlled with medication.   If he nauseated and vomiting, he would need NG.  If he formed a true gastric output obstruction he may need surgical intervention with a gastrojejunostomy type procedure, but it does not seem like he is at that point yet.   Largest issue is what the mass is and how to work it up. It is in a location that would be challenging and could be problematic pending the type of cancer we are looking at. Given his other history, prostate cancer could metastasize to the retroperitoneal region, he has no prior reported extramedullary plasmacytomas and these are rare. Serologic testing could help identify what this mass is a recurrence of and forgo need for any image guided biopsy with radiology. I would think that could also occur as an outpatient if he improved clinically.   Based on his symptoms of diarrhea and abdominal pain, I think he could be observed and sent out with plan to follow up with Oncology ASAP for outpatient workup. If he worsened with true gastric outlet obstruction symptoms, then he may need  surgical intervention, but nothing I am seeing right now indicates that need.   His last PSA was in 06/2024 and was normal so that could be sent today.  There are likely test they follow with multiple myeloma.   No surgical intervention indicated currently. I think he could be observed at [facility] and likely further work up could be done as outpatient versus consulting Oncology Monday to see if they would want an image guided biopsy, but with the location and vessel encasement this could be challenging.   I spent over 40 minutes reviewing the patient's chart and looking at images.   [name], MD [facility] [address] [place], [place] [address] [phone] (office)

## 2024-12-10 NOTE — Assessment & Plan Note (Signed)
-   Unclear etiology at the moment - For now supportive care and anticipated discussion with oncology service (if is stable most likely as an outpatient). -No needing surgical intervention at the moment. -NG tube in place with ongoing nausea; this will assist with gastric and duodenal decompression. - PPI for prophylaxis will be provided.

## 2024-12-10 NOTE — Assessment & Plan Note (Signed)
-   Planning to resume iron supplementations when tolerated by mouth.

## 2024-12-10 NOTE — Consult Note (Addendum)
 [facility] Consult  Reason for Consult:Retroperitoneal mass/ gastric distention/ abdominal pain/diarrhea  Referring Physician: Dr. Melvenia (ED); Dr. Ricky (Hospitalist)  Chief Complaint   Abdominal Pain; Nausea     HPI: Luis Stewart is a 73 y.o. male with prostate cancer with skeletal metastasis on Eligard  every 6 months and multiple myeloma on Daratumumab  + Revlimid  at [facility] but he does follow with Dr. Davonna at [facility]. He came in with abdominal pain and diarrhea per Dr. Melvenia report. There are some notes from Oncology about recent nausea.   His CT demonstrated a retroperitoneal mass with some duodenal and gastric dilation. No evidence or concern for obstruction noted. I reviewed his CT and notes last night and given the location of the retroperitoneal mass abutting the duodenum/ pancreas and encasing some of the renal vessels this is not something that is going to easily access surgical for any biopsy or removal, and recommended serology testing to see if one of his known cancers is likely the cause.  I also recommended observation and seeing if he could get outpatient follow up for further work up of this mass which would potentially include percutaneous biopsy if IR could get a window.   I had said he may need an NG if he was vomiting, and he has had one placed overnight.  Today he reports no nausea. Minimal output was noted in the NG. He has not had a BM.   Past Medical History:  Diagnosis Date   Arthritis    Asbestosis (HCC)    Closed fracture of distal end of fibula with tibia with routine healing 05/28/2020   COVID-19 09/03/2021   Cyst of kidney, acquired    Cystic disease of liver    Enlarged prostate    Headache    History of fracture of leg    Right, childhood   Hx of migraine headaches    Hyperlipidemia    Multiple myeloma (HCC)    Neuropathy    PE (pulmonary thromboembolism) (HCC)    After knee surgery   Prostate cancer  (HCC) 2015   Adenocarcinoma by biopsy    Past Surgical History:  Procedure Laterality Date   BIOPSY PROSTATE  2003 and 2005   COLONOSCOPY  11/28/2008   Dr. Rourk:internal hemorrhoids/tortus colon/ascending colon polyps, tubular adenomas   COLONOSCOPY N/A 09/04/2014   Procedure: COLONOSCOPY;  Surgeon: Lamar CHRISTELLA Hollingshead, MD;  [facility];  Service: Endoscopy;  Laterality: N/A;  9:30   COLONOSCOPY N/A 07/17/2017   Procedure: COLONOSCOPY;  Surgeon: Harvey Margo CROME, MD;  [facility];  Service: Endoscopy;  Laterality: N/A;   COLONOSCOPY WITH PROPOFOL  N/A 10/05/2023   Procedure: COLONOSCOPY WITH PROPOFOL ;  Surgeon: Hollingshead Lamar CHRISTELLA, MD;  [facility];  Service: Endoscopy;  Laterality: N/A;  10:00AM, ASA 3   ESOPHAGOGASTRODUODENOSCOPY N/A 07/16/2017   Procedure: ESOPHAGOGASTRODUODENOSCOPY (EGD);  Surgeon: Harvey Margo CROME, MD;  [facility];  Service: Endoscopy;  Laterality: N/A;   GIVENS CAPSULE STUDY  07/16/2017   Procedure: GIVENS CAPSULE STUDY;  Surgeon: Harvey Margo CROME, MD;  [facility];  Service: Endoscopy;;   JOINT REPLACEMENT N/A    Phreesia 01/12/2021   POLYPECTOMY  07/17/2017   Procedure: POLYPECTOMY;  Surgeon: Harvey Margo CROME, MD;  [facility];  Service: Endoscopy;;  cecal   POLYPECTOMY  10/05/2023   Procedure: POLYPECTOMY;  Surgeon: Hollingshead Lamar CHRISTELLA, MD;  [facility];  Service: Endoscopy;;   PORTACATH  PLACEMENT Left 04/18/2022   Procedure: INSERTION PORT-A-CATH;  Surgeon: Mavis Anes, MD;  Location: [facility];  Service: General;  Laterality: Left;   TOTAL KNEE ARTHROPLASTY Right 06/10/2017   TOTAL KNEE ARTHROPLASTY Right 06/10/2017   Procedure: TOTAL KNEE ARTHROPLASTY;  Surgeon: Beverley Toribio FALCON, MD;  Location: [facility] OR;  Service: Orthopedics;  Laterality: Right;    Family History  Problem Relation Age of Onset   Diabetes Mother    Hypertension Mother    Obesity Mother    Heart disease Mother    Mental illness  Mother    Hypertension Sister    Obesity Sister    Arthritis Sister    Deep vein thrombosis Father    Dementia Father    Heart disease Father        CABG   Colon cancer Neg Hx     Social History[1]  Medications: I have reviewed the patient's current medications. Prior to Admission: (Not in a hospital admission)  Scheduled:  enoxaparin  (LOVENOX ) injection  40 mg Subcutaneous Q24H   ondansetron  (ZOFRAN ) IV  4 mg Intravenous Once   pantoprazole  (PROTONIX ) IV  40 mg Intravenous Q24H   Continuous:  lactated ringers  Stopped (12/10/24 1208)   PRN:acetaminophen  **OR** acetaminophen , HYDROmorphone  (DILAUDID ) injection, ondansetron  **OR** ondansetron  (ZOFRAN ) IV, phenol  Allergies[2]   ROS:  A comprehensive review of systems was negative except for: Gastrointestinal: positive for abdominal pain and constipation  Blood pressure 131/70, pulse 65, temperature 98.2 F (36.8 C), resp. rate (!) 27, height 6' 2 (1.88 m), weight 81.2 kg, SpO2 99%. Physical Exam Vitals reviewed.  HENT:     Head: Normocephalic and atraumatic.  Cardiovascular:     Rate and Rhythm: Normal rate.  Pulmonary:     Effort: Pulmonary effort is normal.  Abdominal:     Palpations: Abdomen is soft.     Tenderness: There is no abdominal tenderness.  Musculoskeletal:     Comments: Moves all extremities   Skin:    General: Skin is warm.  Neurological:     General: No focal deficit present.     Mental Status: He is alert and oriented to person, place, and time.  Psychiatric:        Mood and Affect: Mood normal.     Results: Results for orders placed or performed during the hospital encounter of 12/09/24 (from the past 48 hours)  CBC with Differential     Status: Abnormal   Collection Time: 12/09/24 10:32 PM  Result Value Ref Range   WBC 6.3 4.0 - 10.5 K/uL   RBC 3.48 (L) 4.22 - 5.81 MIL/uL   Hemoglobin 11.7 (L) 13.0 - 17.0 g/dL   HCT 65.2 (L) 60.9 - 47.9 %   MCV 99.7 80.0 - 100.0 fL   MCH 33.6 26.0 -  34.0 pg   MCHC 33.7 30.0 - 36.0 g/dL   RDW 84.3 (H) 88.4 - 84.4 %   Platelets 145 (L) 150 - 400 K/uL   nRBC 0.0 0.0 - 0.2 %   Neutrophils Relative % 86 %   Neutro Abs 5.4 1.7 - 7.7 K/uL   Lymphocytes Relative 6 %   Lymphs Abs 0.4 (L) 0.7 - 4.0 K/uL   Monocytes Relative 8 %   Monocytes Absolute 0.5 0.1 - 1.0 K/uL   Eosinophils Relative 0 %   Eosinophils Absolute 0.0 0.0 - 0.5 K/uL   Basophils Relative 0 %   Basophils Absolute 0.0 0.0 - 0.1 K/uL   Immature Granulocytes 0 %  Abs Immature Granulocytes 0.01 0.00 - 0.07 K/uL    Comment: Performed at [facility], [address], [place], [place] [address]  Comprehensive metabolic panel     Status: Abnormal   Collection Time: 12/09/24 10:32 PM  Result Value Ref Range   Sodium 138 135 - 145 mmol/L   Potassium 3.8 3.5 - 5.1 mmol/L   Chloride 104 98 - 111 mmol/L   CO2 28 22 - 32 mmol/L   Glucose, Bld 105 (H) 70 - 99 mg/dL    Comment: Glucose reference range applies only to samples taken after fasting for at least 8 hours.   BUN 19 8 - 23 mg/dL   Creatinine, Ser 8.90 0.61 - 1.24 mg/dL   Calcium  8.8 (L) 8.9 - 10.3 mg/dL   Total Protein 6.7 6.5 - 8.1 g/dL   Albumin 3.9 3.5 - 5.0 g/dL   AST 20 15 - 41 U/L   ALT 32 0 - 44 U/L   Alkaline Phosphatase 42 38 - 126 U/L   Total Bilirubin 0.6 0.0 - 1.2 mg/dL   GFR, Estimated >39 >39 mL/min    Comment: (NOTE) Calculated using the CKD-EPI Creatinine Equation (2021)    Anion gap 6 5 - 15    Comment: Performed at [facility], [address] [place], [place] [address]  Urinalysis, w/ Reflex to Culture (Infection Suspected) -Urine, Clean Catch     Status: Abnormal   Collection Time: 12/09/24 10:32 PM  Result Value Ref Range   Specimen Source URINE, CLEAN CATCH    Color, Urine STRAW (A) YELLOW   APPearance CLEAR CLEAR   Specific Gravity, Urine 1.011 1.005 - 1.030   pH 5.0 5.0 - 8.0   Glucose, UA NEGATIVE NEGATIVE mg/dL   Hgb urine dipstick SMALL (A) NEGATIVE   Bilirubin Urine NEGATIVE  NEGATIVE   Ketones, ur NEGATIVE NEGATIVE mg/dL   Protein, ur NEGATIVE NEGATIVE mg/dL   Nitrite NEGATIVE NEGATIVE   Leukocytes,Ua NEGATIVE NEGATIVE   RBC / HPF 0-5 0 - 5 RBC/hpf   WBC, UA 0-5 0 - 5 WBC/hpf    Comment:        Reflex urine culture not performed if WBC <=10, OR if Squamous epithelial cells >5. If Squamous epithelial cells >5 suggest recollection.    Bacteria, UA NONE SEEN NONE SEEN   Squamous Epithelial / HPF 0-5 0 - 5 /HPF    Comment: Performed at [facility], [address] [place], [place] [address]  Lipase, blood     Status: None   Collection Time: 12/09/24 10:32 PM  Result Value Ref Range   Lipase 42 11 - 51 U/L    Comment: Performed at [facility], [address], [place], [place] [address]   Personally reviewed his imaging and showed him and his wife- mass in the area of the duodenum/pancreas with some encasement of the renal vessels, nothing on prior PET or report from PET 09/2024 at [facility] DG Chest Portable 1 View Result Date: 12/10/2024 EXAM: 1 VIEW(S) XRAY OF THE CHEST 12/10/2024 01:59:51 AM COMPARISON: Comparison with 03/22/2024 and CT 09/05/2024. CLINICAL HISTORY: tube placement FINDINGS: LINES, TUBES AND DEVICES: Left chest wall port with the tip in the mid SVC. Subdiaphragmatic enteric tube. LUNGS AND PLEURA: Bibasilar atelectasis versus scarring. The lungs are otherwise clear. No pleural effusion. No pneumothorax. HEART AND MEDIASTINUM: No acute abnormality of the cardiac and mediastinal silhouettes. BONES AND SOFT TISSUES: No acute osseous abnormality. IMPRESSION: 1. Left chest wall port with the tip in  the mid SVC and subdiaphragmatic enteric tube. 2. No acute findings. Electronically signed by: Norman Gatlin MD 12/10/2024 02:05 AM EST RP Workstation: [id number]   CT ABDOMEN PELVIS W CONTRAST Result Date: 12/10/2024 CLINICAL DATA:  Left lower quadrant pain EXAM: CT ABDOMEN AND PELVIS WITH CONTRAST TECHNIQUE: Multidetector CT imaging of the abdomen  and pelvis was performed using the standard protocol following bolus administration of intravenous contrast. RADIATION DOSE REDUCTION: This exam was performed according to the departmental dose-optimization program which includes automated exposure control, adjustment of the mA and/or kV according to patient size and/or use of iterative reconstruction technique. CONTRAST:  OMNIPAQUE  IOHEXOL  300 MG/ML  SOLN COMPARISON:  PET CT 06/16/2024, CT abdomen pelvis 04/26/2022 FINDINGS: Lower chest: Lung bases demonstrate mild dependent atelectasis. Hepatobiliary: Numerous hepatic cysts and additional subcentimeter hypodensities too small to further characterize. No calcified gallstones or biliary dilatation. Pancreas: Unremarkable. No pancreatic ductal dilatation or surrounding inflammatory changes. Spleen: Normal in size without focal abnormality. Adrenals/Urinary Tract: Adrenal glands are normal. Kidneys show no hydronephrosis. Right renal cyst for which no imaging follow-up is recommended. The bladder is unremarkable. Stomach/Bowel: Prominent gastric distension with debris. Dilated duodenal bulb. Abnormal appearance of the second and proximal third portion of duodenum. Abnormal mass or masslike process in the right retroperitoneum, appears associated with the second and proximal third aspect of duodenum which appear abnormally thickened. Mass like area measures about 5.7 x 5.4 cm on series 3, image 39. It extends to the right renal hilar region on series 3, image 35 and appears to surround right renal hilar vessels. Vascular/Lymphatic: Aortic atherosclerosis. No aneurysm. No suspicious lymph nodes Reproductive: Markedly enlarged prostate with mass effect on the bladder Other: No ascites or free air. Musculoskeletal: Heterogenous sclerosis and lucency involving the pelvic bones is grossly stable compared with the prior exam and could reflect Paget's disease. IMPRESSION: 1. Abnormal mass or masslike process in the  right retroperitoneum, this appears to involve the second and third portion of duodenum and results in upstream obstruction of the stomach and duodenal bulb. Surgical consultation is recommended. Mass like process extends to the hilar region of the right kidney and appears to encase hilar vessels. Pancreas abuts the masslike process but does not appear to be the origin of the mass. 2. Markedly enlarged prostate with mass effect on the bladder. 3. Heterogenous sclerosis and lucency involving the pelvic bones is grossly stable compared with the prior exam and could reflect Paget's disease. 4. Aortic atherosclerosis. Aortic Atherosclerosis (ICD10-I70.0). Electronically Signed   By: Luke Bun M.D.   On: 12/10/2024 00:17     Assessment & Plan:  Jaquille Kau is a 73 y.o. male with gastric distention and duodenal dilation in the setting of a retroperitoneal mass that is near the duodenum/pancreas. This is ill defined and not on prior PET from 10/25. He has history of metastatic prostate cancer and multiple myeloma on treatment for both. He had a normal PSA 06/2024. NG output minimal and he has no gastric outlet obstruction.  -DC NG, can start full liquid/pureed diet and go slow  -Would think serology testing like PSA and multiple myeloma labs could be checked to see if anything related to his prior disease -No indication for any surgical intervention, if he did develop gastric outlet obstruction he may need a bypass like a gastrojejunostomy  -Discussed that if serology testing was not indicative then he may need biopsy which IR could potentially do but it is in a very difficult location -Discussed that  he was being observed to make sure he can eat and that some of this workup can happen as a outpatient hopefully    All questions were answered to the satisfaction of the patient and family. Discussed with Dr. Ricky.  I spent over 75 minutes from last night (1AM) and today looking at the CT  and reviewing his information and discussing the plan.  Manuelita JAYSON Pander 12/10/2024, 11:16 AM          [1]  Social History Tobacco Use   Smoking status: Never   Smokeless tobacco: Never  Vaping Use   Vaping status: Never Used  Substance Use Topics   Alcohol use: No   Drug use: No  [2]  Allergies Allergen Reactions   Sulfa Antibiotics Other (See Comments)    Unknown reaction. Childhood reaction

## 2024-12-10 NOTE — Assessment & Plan Note (Signed)
 Patient will continue tums Ultra

## 2024-12-10 NOTE — H&P (Signed)
 " History and Physical    Luis Stewart FMW:[id number] DOB: March 16, 1951 DOA: 12/09/2024  PCP: [name], MD Patient coming from: Home  Chief Complaint: Diarrhea and abdominal pain  HPI: Luis Stewart is a 73 y.o. male with medical history significant of multiple myeloma, prostate cancer, iron deficiency anemia, paroxysmal supraventricular tachycardia, GERD and hyperlipidemia presented to the hospital with complaint of abdominal pain and diarrhea.  Cording to the patient he is scheduled to undergo CAR-T treatment.  During the interim patient is supposed to be taking cycles of chemotherapy with Pomalyst  and dexamethasone  for 7 days along with carfilzomib  for 14 days.  This cycle is scheduled to washout before he does the CAR-T infusion. Patient had treatment on the 15th of this month.  States that he was having constipation at home.  He started taking MiraLAX  in addition to Dulcolax on Tuesday and then he said he finally had a bowel movement on Wednesday.  However subsequent to the bowel movement he started having diarrhea.  He reports its been persistent along with the left-sided abdominal pain.  He called his oncologist to let him know that he has been having a lot of gas and indigestion with that new chemo medication,pomalyst .  He is advised to push fluids, add Gatorade or Pedialyte and then call the clinic if he develops any fevers of 100.4 or any worsening symptoms.  Patient however was not improving therefore presented to the hospital for further evaluation and treatment today.   ED Course:  In the ER, BP 121/67, HR 69, RR 18, O2 saturation 6% on room air,  and Tmax 9.5. Cbc demonstrated wbc 6.3,  hb/hct 11.7/34.7, and platelet 145. Chemistry demonstrated Na 138, K 3.8, Cl 104, bicarb 28, Bun/Cr 19/1.09 and glucose 105.   CXR demonstrated left chest wall port with the tip in the mid SVC and subdiaphragmatic enteric tube.. Urinalysis negative for acute infection such  as UTI. EKG was not performed.  Review of Systems:  All systems reviewed and apart from history of presenting illness, are negative.  Past Medical History:  Diagnosis Date   Arthritis    Asbestosis (HCC)    Closed fracture of distal end of fibula with tibia with routine healing 05/28/2020   COVID-19 09/03/2021   Cyst of kidney, acquired    Cystic disease of liver    Enlarged prostate    Headache    History of fracture of leg    Right, childhood   Hx of migraine headaches    Hyperlipidemia    Multiple myeloma (HCC)    Neuropathy    PE (pulmonary thromboembolism) (HCC)    After knee surgery   Prostate cancer (HCC) 2015   Adenocarcinoma by biopsy    Past Surgical History:  Procedure Laterality Date   BIOPSY PROSTATE  2003 and 2005   COLONOSCOPY  11/28/2008   Dr. Rourk:internal hemorrhoids/tortus colon/ascending colon polyps, tubular adenomas   COLONOSCOPY N/A 09/04/2014   Procedure: COLONOSCOPY;  Surgeon: Lamar CHRISTELLA Hollingshead, MD;  Location: [facility];  Service: Endoscopy;  Laterality: N/A;  9:30   COLONOSCOPY N/A 07/17/2017   Procedure: COLONOSCOPY;  Surgeon: Harvey Margo CROME, MD;  Location: [facility];  Service: Endoscopy;  Laterality: N/A;   COLONOSCOPY WITH PROPOFOL  N/A 10/05/2023   Procedure: COLONOSCOPY WITH PROPOFOL ;  Surgeon: Hollingshead Lamar CHRISTELLA, MD;  Location: [facility];  Service: Endoscopy;  Laterality: N/A;  10:00AM, ASA 3   ESOPHAGOGASTRODUODENOSCOPY N/A 07/16/2017   Procedure: ESOPHAGOGASTRODUODENOSCOPY (EGD);  Surgeon:  Fields, Margo CROME, MD;  Location: [facility];  Service: Endoscopy;  Laterality: N/A;   GIVENS CAPSULE STUDY  07/16/2017   Procedure: GIVENS CAPSULE STUDY;  Surgeon: Harvey Margo CROME, MD;  Location: [facility];  Service: Endoscopy;;   JOINT REPLACEMENT N/A    Phreesia 01/12/2021   POLYPECTOMY  07/17/2017   Procedure: POLYPECTOMY;  Surgeon: Harvey Margo CROME, MD;  Location: [facility];  Service: Endoscopy;;  cecal   POLYPECTOMY  10/05/2023    Procedure: POLYPECTOMY;  Surgeon: [name], MD;  Location: [facility];  Service: Endoscopy;;   PORTACATH PLACEMENT Left 04/18/2022   Procedure: INSERTION PORT-A-CATH;  Surgeon: Mavis Anes, MD;  Location: [facility];  Service: General;  Laterality: Left;   TOTAL KNEE ARTHROPLASTY Right 06/10/2017   TOTAL KNEE ARTHROPLASTY Right 06/10/2017   Procedure: TOTAL KNEE ARTHROPLASTY;  Surgeon: Beverley Toribio FALCON, MD;  Location: [facility];  Service: Orthopedics;  Laterality: Right;     reports that he has never smoked. He has never used smokeless tobacco. He reports that he does not drink alcohol and does not use drugs.  Allergies[1]  Family History  Problem Relation Age of Onset   Diabetes Mother    Hypertension Mother    Obesity Mother    Heart disease Mother    Mental illness Mother    Hypertension Sister    Obesity Sister    Arthritis Sister    Deep vein thrombosis Father    Dementia Father    Heart disease Father        CABG   Colon cancer Neg Hx     Prior to Admission medications  Medication Sig Start Date End Date Taking? Authorizing Provider  acetaminophen  (TYLENOL ) 500 MG tablet Take 1,000 mg by mouth every 6 (six) hours as needed for mild pain or moderate pain.    [provider]  acyclovir  (ZOVIRAX ) 400 MG tablet TAKE 1 TABLET BY MOUTH TWICE A DAY 03/30/24   [name], MD  acyclovir  (ZOVIRAX ) 400 MG tablet Take 1 tablet (400 mg total) by mouth daily. 12/05/24   Davonna Siad, MD  albuterol  (VENTOLIN  HFA) 108 (90 Base) MCG/ACT inhaler Inhale 2 puffs into the lungs every 6 (six) hours as needed for wheezing or shortness of breath. 09/06/24   [name], MD  Cholecalciferol (VITAMIN D ) 2000 units CAPS Take 2,000 Units by mouth every other day.    [provider]  dexamethasone  (DECADRON ) 4 MG tablet Take 20 mg 30 minutes prior to treatment 11/08/24   Kandala, Hyndavi, MD  dexamethasone  (DECADRON ) 4 MG tablet Take 5 tablets (20 mg) for 1 day on the  week after chemotherapy (day 22). 12/05/24   Davonna Siad, MD  DULoxetine  (CYMBALTA ) 60 MG capsule TAKE 1 CAPSULE BY MOUTH EVERY DAY 11/29/24   Patel, Rutwik K, MD  ferrous sulfate 325 (65 FE) MG EC tablet Take 325 mg by mouth daily.    [provider]  guaiFENesin -codeine  100-10 MG/5ML syrup Take 5 mLs by mouth 3 (three) times daily as needed for cough. 09/06/24   [name], MD  lidocaine -prilocaine  (EMLA ) cream Apply to affected area once 12/05/24   Kandala, Hyndavi, MD  Multiple Vitamin (MULTIVITAMIN WITH MINERALS) TABS tablet Take 1 tablet by mouth daily with breakfast. Centrum Silver for Men    [provider]  nitroGLYCERIN  (NITROSTAT ) 0.4 MG SL tablet Place 1 tablet (0.4 mg total) under the tongue every 5 (five) minutes x 3 doses as needed for  chest pain (If no relief after 3rd dose, call or go to ED.). 11/20/22   Miriam Norris, NP  ondansetron  (ZOFRAN ) 8 MG tablet Take 1 tablet (8 mg total) by mouth every 8 (eight) hours as needed for nausea or vomiting. 11/25/24   Geofm Delon BRAVO, NP  ondansetron  (ZOFRAN ) 8 MG tablet Take 1 tablet (8 mg total) by mouth every 8 (eight) hours as needed for nausea or vomiting. 12/05/24   Davonna Siad, MD  pantoprazole  (PROTONIX ) 40 MG tablet TAKE 1 TABLET BY MOUTH EVERY OTHER DAY 07/25/24   [name], MD  pomalidomide  (POMALYST ) 3 MG capsule Take 1 capsule (3 mg total) by mouth daily. 21 days on, 7 days off. 12/02/24   Davonna Siad, MD  potassium chloride  (KLOR-CON ) 10 MEQ tablet Take 1 tablet (10 mEq total) by mouth daily. 09/22/24 12/21/24  Kandala, Hyndavi, MD  prochlorperazine  (COMPAZINE ) 10 MG tablet Take 1 tablet (10 mg total) by mouth every 6 (six) hours as needed for nausea or vomiting. 12/05/24   Davonna Siad, MD  RESTASIS 0.05 % ophthalmic emulsion Place 1 drop into both eyes 2 (two) times daily. 10/18/23   [provider]  rosuvastatin  (CRESTOR ) 5 MG tablet TAKE 1 TABLET (5 MG TOTAL) BY MOUTH DAILY.  03/30/24 10/05/25  [name], MD    Physical Exam: Vitals:   12/09/24 2103 12/09/24 2200 12/10/24 0215 12/10/24 0226  BP: 130/73 117/73  121/67  Pulse: 74 65 67 69  Resp: 18 17  19   Temp: 99.5 F (37.5 C)     TempSrc: Oral     SpO2: 100% 99% 95% 96%  Weight:      Height:        Physical Exam Constitutional:      General: He is not in acute distress.    Appearance: Normal appearance.  HENT:     Head: Normocephalic and atraumatic.  Eyes:     Extraocular Movements: Extraocular movements intact.     Conjunctiva/sclera: Conjunctivae normal.     Pupils: Pupils are equal, round, and reactive to light.  Cardiovascular:     Rate and Rhythm: Normal rate and regular rhythm.     Pulses: Normal pulses.     Heart sounds: Normal heart sounds.  Pulmonary:     Effort: Pulmonary effort is normal. No respiratory distress.     Breath sounds: Normal breath sounds. No wheezing, rhonchi or rales.  Abdominal:     General:  Bowel sounds are hypoactive.  Distention of the abdomen.    Palpations: Abdomen is soft.     Tenderness: Mild tenderness diffusely in the abdomen. Musculoskeletal:        General: No deformity. Normal range of motion.  Skin:    General: Skin is warm and dry.     Coloration: Skin is not jaundiced.  Neurological:     General: No focal deficit present.     Mental Status: He is alert and oriented to person, place, and time. Mental status is at baseline.   Labs on Admission: I have personally reviewed following labs and imaging studies  CBC: Recent Labs  Lab 12/09/24 2232  WBC 6.3  NEUTROABS 5.4  HGB 11.7*  HCT 34.7*  MCV 99.7  PLT 145*   Basic Metabolic Panel: Recent Labs  Lab 12/09/24 2232  NA 138  K 3.8  CL 104  CO2 28  GLUCOSE 105*  BUN 19  CREATININE 1.09  CALCIUM  8.8*   GFR: Estimated Creatinine Clearance: 68.3 mL/min (  by C-G formula based on SCr of 1.09 mg/dL). Liver Function Tests: Recent Labs  Lab 12/09/24 2232  AST 20  ALT 32   ALKPHOS 42  BILITOT 0.6  PROT 6.7  ALBUMIN 3.9   Recent Labs  Lab 12/09/24 2232  LIPASE 42   No results for input(s): AMMONIA in the last 168 hours. Coagulation Profile: No results for input(s): INR, PROTIME in the last 168 hours. Cardiac Enzymes: No results for input(s): CKTOTAL, CKMB, CKMBINDEX, TROPONINI in the last 168 hours. BNP (last 3 results) No results for input(s): PROBNP in the last 8760 hours. HbA1C: No results for input(s): HGBA1C in the last 72 hours. CBG: No results for input(s): GLUCAP in the last 168 hours. Lipid Profile: No results for input(s): CHOL, HDL, LDLCALC, TRIG, CHOLHDL, LDLDIRECT in the last 72 hours. Thyroid  Function Tests: No results for input(s): TSH, T4TOTAL, FREET4, T3FREE, THYROIDAB in the last 72 hours. Anemia Panel: No results for input(s): VITAMINB12, FOLATE, FERRITIN, TIBC, IRON, RETICCTPCT in the last 72 hours. Urine analysis:    Component Value Date/Time   COLORURINE STRAW (A) 12/09/2024 2232   APPEARANCEUR CLEAR 12/09/2024 2232   APPEARANCEUR Clear 04/28/2023 1616   LABSPEC 1.011 12/09/2024 2232   PHURINE 5.0 12/09/2024 2232   GLUCOSEU NEGATIVE 12/09/2024 2232   HGBUR SMALL (A) 12/09/2024 2232   BILIRUBINUR NEGATIVE 12/09/2024 2232   BILIRUBINUR Negative 04/28/2023 1616   KETONESUR NEGATIVE 12/09/2024 2232   PROTEINUR NEGATIVE 12/09/2024 2232   UROBILINOGEN 0.2 03/27/2020 1138   NITRITE NEGATIVE 12/09/2024 2232   LEUKOCYTESUR NEGATIVE 12/09/2024 2232    Radiological Exams on Admission: DG Chest Portable 1 View Result Date: 12/10/2024 EXAM: 1 VIEW(S) XRAY OF THE CHEST 12/10/2024 01:59:51 AM COMPARISON: Comparison with 03/22/2024 and CT 09/05/2024. CLINICAL HISTORY: tube placement FINDINGS: LINES, TUBES AND DEVICES: Left chest wall port with the tip in the mid SVC. Subdiaphragmatic enteric tube. LUNGS AND PLEURA: Bibasilar atelectasis versus scarring. The lungs are otherwise  clear. No pleural effusion. No pneumothorax. HEART AND MEDIASTINUM: No acute abnormality of the cardiac and mediastinal silhouettes. BONES AND SOFT TISSUES: No acute osseous abnormality. IMPRESSION: 1. Left chest wall port with the tip in the mid SVC and subdiaphragmatic enteric tube. 2. No acute findings. Electronically signed by: Norman Gatlin MD 12/10/2024 02:05 AM EST RP Workstation: [id number]   CT ABDOMEN PELVIS W CONTRAST Result Date: 12/10/2024 CLINICAL DATA:  Left lower quadrant pain EXAM: CT ABDOMEN AND PELVIS WITH CONTRAST TECHNIQUE: Multidetector CT imaging of the abdomen and pelvis was performed using the standard protocol following bolus administration of intravenous contrast. RADIATION DOSE REDUCTION: This exam was performed according to the departmental dose-optimization program which includes automated exposure control, adjustment of the mA and/or kV according to patient size and/or use of iterative reconstruction technique. CONTRAST:  OMNIPAQUE  IOHEXOL  300 MG/ML  SOLN COMPARISON:  PET CT 06/16/2024, CT abdomen pelvis 04/26/2022 FINDINGS: Lower chest: Lung bases demonstrate mild dependent atelectasis. Hepatobiliary: Numerous hepatic cysts and additional subcentimeter hypodensities too small to further characterize. No calcified gallstones or biliary dilatation. Pancreas: Unremarkable. No pancreatic ductal dilatation or surrounding inflammatory changes. Spleen: Normal in size without focal abnormality. Adrenals/Urinary Tract: Adrenal glands are normal. Kidneys show no hydronephrosis. Right renal cyst for which no imaging follow-up is recommended. The bladder is unremarkable. Stomach/Bowel: Prominent gastric distension with debris. Dilated duodenal bulb. Abnormal appearance of the second and proximal third portion of duodenum. Abnormal mass or masslike process in the right retroperitoneum, appears associated with the second and proximal third aspect  of duodenum which appear abnormally  thickened. Mass like area measures about 5.7 x 5.4 cm on series 3, image 39. It extends to the right renal hilar region on series 3, image 35 and appears to surround right renal hilar vessels. Vascular/Lymphatic: Aortic atherosclerosis. No aneurysm. No suspicious lymph nodes Reproductive: Markedly enlarged prostate with mass effect on the bladder Other: No ascites or free air. Musculoskeletal: Heterogenous sclerosis and lucency involving the pelvic bones is grossly stable compared with the prior exam and could reflect Paget's disease. IMPRESSION: 1. Abnormal mass or masslike process in the right retroperitoneum, this appears to involve the second and third portion of duodenum and results in upstream obstruction of the stomach and duodenal bulb. Surgical consultation is recommended. Mass like process extends to the hilar region of the right kidney and appears to encase hilar vessels. Pancreas abuts the masslike process but does not appear to be the origin of the mass. 2. Markedly enlarged prostate with mass effect on the bladder. 3. Heterogenous sclerosis and lucency involving the pelvic bones is grossly stable compared with the prior exam and could reflect Paget's disease. 4. Aortic atherosclerosis. Aortic Atherosclerosis (ICD10-I70.0). Electronically Signed   By: Luke Bun M.D.   On: 12/10/2024 00:17    EKG: Independently reviewed.   Assessment/Plan Principal Problem:   Retroperitoneal mass Active Problems:   Hyperlipidemia   Prostate cancer (HCC)   History of pulmonary embolism   GERD (gastroesophageal reflux disease)   Multiple myeloma (HCC)   Iron deficiency anemia   Retroperitoneal mass CT abdomen demonstrated abnormal mass or masslike process in the right retroperitoneum appears to involve the 2nd and 3rd portion of duodenum resulting upstream obstruction of the stomach and duodenal bulb Masslike process extends to the hilar region of the right kidney appears to encase hilar vessels.   Pancreas abuts the masslike process but does not appear to be origin of the mass. Results were discussed with general surgery who felt that patient did not need intervention at this time. If he develops  true gastric outlet symptoms then he will need surgical intervention  Patient is having some nausea and therefore NG tube was placed Patient has been started on IV fluids Continue to monitor Recommendations are for oncology to see patient with further workup  Hyperlipidemia Patient is on statin therapy and this can be continued once he is more stable and can eat  Prostate cancer Patient still has a large prostate with mass he has been Followed outpatient with oncology  Multiple myeloma According to oncology's notes patient is not in remission They are attempting to try CAR-T Patient however will have to have a cycle of chemotherapy prior to this Appears that CAR-T is scheduled for the beginning of January   Iron Deficiency anemia HB stable at present 11.7/34.7 Will continue to monitor    DVT prophylaxis: lovenox   Code Status: full Family Communication:   Disposition Plan:  Patient class is not currently inpatient or observation. Update patient class prior to completing documentation    Consults called: general surgery Admission status: full  Level of care: Level of care:  The medical decision making on this patient was of high complexity and the patient is at high risk for clinical deterioration, therefore this is a level 3 visit.  The medical decision making is of moderate complexity, therefore this is a level 2 visit.  Bradly MARLA Drones MD [facility]  If 7PM-7AM, please contact night-coverage [email or web address]  12/10/2024, 2:39 AM       [  1]  Allergies Allergen Reactions   Sulfa Antibiotics Other (See Comments)    Unknown reaction. Childhood reaction   "

## 2024-12-11 ENCOUNTER — Inpatient Hospital Stay (HOSPITAL_COMMUNITY)

## 2024-12-11 DIAGNOSIS — Z86711 Personal history of pulmonary embolism: Secondary | ICD-10-CM | POA: Diagnosis not present

## 2024-12-11 DIAGNOSIS — E782 Mixed hyperlipidemia: Secondary | ICD-10-CM | POA: Diagnosis not present

## 2024-12-11 DIAGNOSIS — R1900 Intra-abdominal and pelvic swelling, mass and lump, unspecified site: Secondary | ICD-10-CM | POA: Diagnosis not present

## 2024-12-11 DIAGNOSIS — C9000 Multiple myeloma not having achieved remission: Secondary | ICD-10-CM | POA: Diagnosis not present

## 2024-12-11 DIAGNOSIS — D509 Iron deficiency anemia, unspecified: Secondary | ICD-10-CM | POA: Diagnosis not present

## 2024-12-11 DIAGNOSIS — C7951 Secondary malignant neoplasm of bone: Secondary | ICD-10-CM | POA: Diagnosis not present

## 2024-12-11 DIAGNOSIS — K219 Gastro-esophageal reflux disease without esophagitis: Secondary | ICD-10-CM | POA: Diagnosis not present

## 2024-12-11 DIAGNOSIS — C61 Malignant neoplasm of prostate: Secondary | ICD-10-CM | POA: Diagnosis not present

## 2024-12-11 MED ORDER — PANTOPRAZOLE SODIUM 40 MG PO TBEC: 40.0000 mg | DELAYED_RELEASE_TABLET | Freq: Every day | ORAL | Status: DC

## 2024-12-11 MED ORDER — ACYCLOVIR 400 MG PO TABS: 400.0000 mg | ORAL_TABLET | Freq: Two times a day (BID) | ORAL | Status: DC

## 2024-12-11 NOTE — Discharge Summary (Signed)
 " Physician Discharge Summary   Patient: Luis Stewart MRN: [id number] DOB: [birth year]  Admit date:     12/09/2024  Discharge date: 12/11/2024  Discharge Physician: Eric Nunnery   PCP: [name], MD   Recommendations at discharge:  Repeat CBC to follow hemoglobin trend/stability Repeat basic metabolic panel to follow electrolytes and renal function make sure patient has follow-up with oncology service and manage plan for further evaluation and/management of newly found retroperitoneal mass.  Discharge Diagnoses: Principal Problem:   Retroperitoneal mass Active Problems:   Hyperlipidemia   Prostate cancer metastatic to bone (HCC)   History of pulmonary embolism   GERD (gastroesophageal reflux disease)   Multiple myeloma (HCC)   Iron deficiency anemia  Brief hospital admission narrative: As per H&P written by Dr. Lorren Stewart 12/10/2024 Luis Stewart is a 73 y.o. male with medical history significant of multiple myeloma, prostate cancer, iron deficiency anemia, paroxysmal supraventricular tachycardia, GERD and hyperlipidemia presented to the hospital with complaint of abdominal pain and diarrhea.  Cording to the patient he is scheduled to undergo CAR-T treatment.  During the interim patient is supposed to be taking cycles of chemotherapy with Pomalyst  and dexamethasone  for 7 days along with carfilzomib  for 14 days.  This cycle is scheduled to washout before he does the CAR-T infusion. Patient had treatment Stewart the 15th of this month.  States that he was having constipation at home.  He started taking MiraLAX  in addition to Dulcolax Stewart Tuesday and then he said he finally had a bowel movement Stewart Wednesday.  However subsequent to the bowel movement he started having diarrhea.  He reports its been persistent along with the left-sided abdominal pain.  He called his oncologist to let him know that he has been having a lot of gas and indigestion with that new chemo  medication,pomalyst .  He is advised to push fluids, add Gatorade or Pedialyte and then call the clinic if he develops any fevers of 100.4 or any worsening symptoms.  Patient however was not improving therefore presented to the hospital for further evaluation and treatment today.   ED Course:  In the ER, BP 121/67, HR 69, RR 18, O2 saturation 6% Stewart room air,  and Tmax 9.5. Cbc demonstrated wbc 6.3,  hb/hct 11.7/34.7, and platelet 145. Chemistry demonstrated Na 138, K 3.8, Cl 104, bicarb 28, Bun/Cr 19/1.09 and glucose 105.   CXR demonstrated left chest wall port with the tip in the mid SVC and subdiaphragmatic enteric tube.. Urinalysis negative for acute infection such as UTI. EKG was not performed.  Assessment and Plan: Retroperitoneal mass - Unclear etiology at the moment - For now supportive care and anticipated discussion with oncology service (after discussing with general surgery this is something that can be done as an outpatient). - Patient demonstrated significant improvement and initial NG tube that was placed successfully remove; tolerating diet and in stable condition for discharge. - Continue PPI - Patient advised to maintain adequate hydration - Continue the use of as needed antiemetics and also medications to continue providing healthy bowel regimen.  Hyperlipidemia - Planning to resume statin  Prostate cancer [facility]) - Continue outpatient follow-up with oncology service.  History of pulmonary embolism - Patient is no longer Stewart blood thinners - Continue outpatient follow-up with hematology/oncology service  GERD (gastroesophageal reflux disease) - Continue PPI.  Multiple myeloma (HCC) - Continue patient follow-up with oncology service.  Iron deficiency anemia - Planning to resume iron supplementations  -no overt bleeding appreciated -  Repeat CBC to follow hemoglobin trend/stability at follow-up visit.  Consultants: General surgery Procedures performed: See below for  x-ray reports.   Disposition: Home Diet recommendation: Soft diet.  DISCHARGE MEDICATION: Allergies as of 12/11/2024       Reactions   Sulfa Antibiotics Other (See Comments)   Unknown reaction. Childhood reaction        Medication List     STOP taking these medications    guaiFENesin -codeine  100-10 MG/5ML syrup       TAKE these medications    acetaminophen  500 MG tablet Commonly known as: TYLENOL  Take 1,000 mg by mouth every 6 (six) hours as needed for mild pain or moderate pain.   acyclovir  400 MG tablet Commonly known as: ZOVIRAX  Take 1 tablet (400 mg total) by mouth 2 (two) times daily. What changed:  when to take this Another medication with the same name was removed. Continue taking this medication, and follow the directions you see here.   albuterol  108 (90 Base) MCG/ACT inhaler Commonly known as: VENTOLIN  HFA Inhale 2 puffs into the lungs every 6 (six) hours as needed for wheezing or shortness of breath.   dexamethasone  4 MG tablet Commonly known as: DECADRON  Take 20 mg 30 minutes prior to treatment What changed:  how much to take how to take this when to take this Another medication with the same name was removed. Continue taking this medication, and follow the directions you see here.   DULoxetine  60 MG capsule Commonly known as: CYMBALTA  TAKE 1 CAPSULE BY MOUTH EVERY DAY   ferrous sulfate 325 (65 FE) MG EC tablet Take 325 mg by mouth daily.   lidocaine -prilocaine  cream Commonly known as: EMLA  Apply to affected area once   multivitamin with minerals Tabs tablet Take 1 tablet by mouth daily with breakfast. Centrum Silver for Men   nitroGLYCERIN  0.4 MG SL tablet Commonly known as: NITROSTAT  Place 1 tablet (0.4 mg total) under the tongue every 5 (five) minutes x 3 doses as needed for chest pain (If no relief after 3rd dose, call or go to ED.).   ondansetron  8 MG tablet Commonly known as: Zofran  Take 1 tablet (8 mg total) by mouth every 8  (eight) hours as needed for nausea or vomiting. What changed: Another medication with the same name was removed. Continue taking this medication, and follow the directions you see here.   pantoprazole  40 MG tablet Commonly known as: PROTONIX  Take 1 tablet (40 mg total) by mouth daily. What changed: when to take this   pomalidomide  3 MG capsule Commonly known as: POMALYST  Take 1 capsule (3 mg total) by mouth daily. 21 days Stewart, 7 days off.   potassium chloride  10 MEQ tablet Commonly known as: KLOR-CON  Take 1 tablet (10 mEq total) by mouth daily.   prochlorperazine  10 MG tablet Commonly known as: COMPAZINE  Take 1 tablet (10 mg total) by mouth every 6 (six) hours as needed for nausea or vomiting.   Restasis 0.05 % ophthalmic emulsion Generic drug: cycloSPORINE Place 1 drop into both eyes 2 (two) times daily.   rosuvastatin  5 MG tablet Commonly known as: CRESTOR  TAKE 1 TABLET (5 MG TOTAL) BY MOUTH DAILY.   Vitamin D  50 MCG (2000 UT) Caps Take 2,000 Units by mouth every other day.        Follow-up Information     [name], MD. Schedule an appointment as soon as possible for a visit in 10 day(s).   Specialty: Internal Medicine Contact information: [address]  [address] [place] [address] [phone]                Discharge Exam: Fredricka Weights   12/09/24 2059  Weight: 81.2 kg   General exam: Alert, awake, oriented x 3; no major distress.  No nausea, no vomiting, feeling ready to go home. Respiratory system: Clear to auscultation. Respiratory effort normal.  Good saturation Stewart room air. Cardiovascular system:RRR. No murmurs, rubs, gallops. Gastrointestinal system: Abdomen is nondistended, soft and without guarding.  Positive bowel sounds. Central nervous system:No focal neurological deficits. Extremities: No C/C/E, +pedal pulses Skin: No petechiae. Psychiatry: Judgement and insight appear normal. Mood & affect appropriate.    Condition at discharge:  Stable and improved.  The results of significant diagnostics from this hospitalization (including imaging, microbiology, ancillary and laboratory) are listed below for reference.   Imaging Studies: Acute Abdominal Series Result Date: 12/11/2024 EXAM: UPRIGHT AND SUPINE XRAY VIEWS OF THE ABDOMEN AND 4 VIEW(S) OF THE CHEST 12/11/2024 12:42:07 AM COMPARISON: 12/10/2024 CLINICAL HISTORY: Small bowel obstruction (HCC) FINDINGS: LUNGS AND PLEURA: Left basilar opacity. Probable small left pleural effusion. No consolidation or pulmonary edema. No pneumothorax. HEART AND MEDIASTINUM: Left chest port remains in place. No acute abnormality of the cardiac and mediastinal silhouettes. BOWEL: Scattered loops of large and small bowel are noted. No obstructive changes are noted. PERITONEUM AND SOFT TISSUES: No abnormal calcifications. No free air. BONES: No acute osseous abnormality. IMPRESSION: 1. No radiographic bowel obstruction or free air. 2. Left basilar opacity and probable small left pleural effusion. Electronically signed by: Oneil Devonshire MD 12/11/2024 12:54 AM EST RP Workstation: [id number]   DG Chest Portable 1 View Result Date: 12/10/2024 EXAM: 1 VIEW(S) XRAY OF THE CHEST 12/10/2024 01:59:51 AM COMPARISON: Comparison with 03/22/2024 and CT 09/05/2024. CLINICAL HISTORY: tube placement FINDINGS: LINES, TUBES AND DEVICES: Left chest wall port with the tip in the mid SVC. Subdiaphragmatic enteric tube. LUNGS AND PLEURA: Bibasilar atelectasis versus scarring. The lungs are otherwise clear. No pleural effusion. No pneumothorax. HEART AND MEDIASTINUM: No acute abnormality of the cardiac and mediastinal silhouettes. BONES AND SOFT TISSUES: No acute osseous abnormality. IMPRESSION: 1. Left chest wall port with the tip in the mid SVC and subdiaphragmatic enteric tube. 2. No acute findings. Electronically signed by: Norman Gatlin MD 12/10/2024 02:05 AM EST RP Workstation: [id number]   CT ABDOMEN PELVIS W  CONTRAST Result Date: 12/10/2024 CLINICAL DATA:  Left lower quadrant pain EXAM: CT ABDOMEN AND PELVIS WITH CONTRAST TECHNIQUE: Multidetector CT imaging of the abdomen and pelvis was performed using the standard protocol following bolus administration of intravenous contrast. RADIATION DOSE REDUCTION: This exam was performed according to the departmental dose-optimization program which includes automated exposure control, adjustment of the mA and/or kV according to patient size and/or use of iterative reconstruction technique. CONTRAST:  OMNIPAQUE  IOHEXOL  300 MG/ML  SOLN COMPARISON:  PET CT 06/16/2024, CT abdomen pelvis 04/26/2022 FINDINGS: Lower chest: Lung bases demonstrate mild dependent atelectasis. Hepatobiliary: Numerous hepatic cysts and additional subcentimeter hypodensities too small to further characterize. No calcified gallstones or biliary dilatation. Pancreas: Unremarkable. No pancreatic ductal dilatation or surrounding inflammatory changes. Spleen: Normal in size without focal abnormality. Adrenals/Urinary Tract: Adrenal glands are normal. Kidneys show no hydronephrosis. Right renal cyst for which no imaging follow-up is recommended. The bladder is unremarkable. Stomach/Bowel: Prominent gastric distension with debris. Dilated duodenal bulb. Abnormal appearance of the second and proximal third portion of duodenum. Abnormal mass or masslike process in the right retroperitoneum, appears associated with the second and proximal  third aspect of duodenum which appear abnormally thickened. Mass like area measures about 5.7 x 5.4 cm Stewart series 3, image 39. It extends to the right renal hilar region Stewart series 3, image 35 and appears to surround right renal hilar vessels. Vascular/Lymphatic: Aortic atherosclerosis. No aneurysm. No suspicious lymph nodes Reproductive: Markedly enlarged prostate with mass effect Stewart the bladder Other: No ascites or free air. Musculoskeletal: Heterogenous sclerosis and lucency  involving the pelvic bones is grossly stable compared with the prior exam and could reflect Paget's disease. IMPRESSION: 1. Abnormal mass or masslike process in the right retroperitoneum, this appears to involve the second and third portion of duodenum and results in upstream obstruction of the stomach and duodenal bulb. Surgical consultation is recommended. Mass like process extends to the hilar region of the right kidney and appears to encase hilar vessels. Pancreas abuts the masslike process but does not appear to be the origin of the mass. 2. Markedly enlarged prostate with mass effect Stewart the bladder. 3. Heterogenous sclerosis and lucency involving the pelvic bones is grossly stable compared with the prior exam and could reflect Paget's disease. 4. Aortic atherosclerosis. Aortic Atherosclerosis (ICD10-I70.0). Electronically Signed   By: Luke Bun M.D.   Stewart: 12/10/2024 00:17    Microbiology: Results for orders placed or performed in visit Stewart 04/28/23  Microscopic Examination     Status: Abnormal   Collection Time: 04/28/23  4:16 PM   Urine  Result Value Ref Range Status   WBC, UA 0-5 0 - 5 /hpf Final   RBC, Urine 3-10 (A) 0 - 2 /hpf Final   Epithelial Cells (non renal) 0-10 0 - 10 /hpf Final   Bacteria, UA None seen None seen/Few Final    Labs: CBC: Recent Labs  Lab 12/09/24 2232  WBC 6.3  NEUTROABS 5.4  HGB 11.7*  HCT 34.7*  MCV 99.7  PLT 145*   Basic Metabolic Panel: Recent Labs  Lab 12/09/24 2232  NA 138  K 3.8  CL 104  CO2 28  GLUCOSE 105*  BUN 19  CREATININE 1.09  CALCIUM  8.8*   Liver Function Tests: Recent Labs  Lab 12/09/24 2232  AST 20  ALT 32  ALKPHOS 42  BILITOT 0.6  PROT 6.7  ALBUMIN 3.9   CBG: No results for input(s): GLUCAP in the last 168 hours.  Discharge time spent:  35 minutes.  Signed: Eric Nunnery, MD [facility] 12/11/2024 "

## 2024-12-11 NOTE — Care Management Obs Status (Signed)
 MEDICARE OBSERVATION STATUS NOTIFICATION   Patient Details  Name: Luis Stewart MRN: [id number] Date of Birth: May 03, 1951   Medicare Observation Status Notification Given:  Yes    Ronnald MARLA Sil, RN 12/11/2024, 2:53 PM

## 2024-12-11 NOTE — Progress Notes (Signed)
" °  Transition of Care [facility]) Screening Note   Patient Details  Name: Luis Stewart Date of Birth: 10/09/1951   [facility]) CM/SW Contact:    Hoy DELENA Bigness, LCSW Phone Number: 12/11/2024, 1:26 PM    Transition of Care Department [facility]) has reviewed patient and no TOC needs have been identified at this time. We will continue to monitor patient advancement through interdisciplinary progression rounds. If new patient transition needs arise, please place a TOC consult.    12/11/24 1326  TOC Brief Assessment  Insurance and Status Reviewed  Patient has primary care physician Yes  Home environment has been reviewed From home w/ spouse  Prior level of function: Independent  Prior/Current Home Services No current home services  Social Drivers of Health Review SDOH reviewed no interventions necessary  Readmission risk has been reviewed Yes  Transition of care needs no transition of care needs at this time    "

## 2024-12-11 NOTE — Care Management CC44 (Signed)
"         Condition Code 44 Documentation Completed  Patient Details  Name: Himmat Enberg MRN: [id number] Date of Birth: 1951-04-03   Condition Code 44 given:  Yes Patient signature on Condition Code 44 notice:  Yes Documentation of 2 MD's agreement:  Yes Code 44 added to claim:  Yes    Ronnald MARLA Sil, RN 12/11/2024, 2:53 PM  "

## 2024-12-12 ENCOUNTER — Inpatient Hospital Stay (HOSPITAL_BASED_OUTPATIENT_CLINIC_OR_DEPARTMENT_OTHER): Admitting: Oncology

## 2024-12-12 ENCOUNTER — Inpatient Hospital Stay

## 2024-12-12 VITALS — BP 129/65 | HR 67 | Temp 98.5°F | Resp 18

## 2024-12-12 DIAGNOSIS — C61 Malignant neoplasm of prostate: Secondary | ICD-10-CM

## 2024-12-12 DIAGNOSIS — C7951 Secondary malignant neoplasm of bone: Secondary | ICD-10-CM | POA: Diagnosis not present

## 2024-12-12 DIAGNOSIS — C9000 Multiple myeloma not having achieved remission: Secondary | ICD-10-CM

## 2024-12-12 DIAGNOSIS — Z5112 Encounter for antineoplastic immunotherapy: Secondary | ICD-10-CM | POA: Diagnosis not present

## 2024-12-12 DIAGNOSIS — D509 Iron deficiency anemia, unspecified: Secondary | ICD-10-CM

## 2024-12-12 DIAGNOSIS — R1900 Intra-abdominal and pelvic swelling, mass and lump, unspecified site: Secondary | ICD-10-CM

## 2024-12-12 LAB — CBC WITH DIFFERENTIAL/PLATELET
Abs Immature Granulocytes: 0.01 K/uL (ref 0.00–0.07)
Basophils Absolute: 0 K/uL (ref 0.0–0.1)
Basophils Relative: 0 %
Eosinophils Absolute: 0 K/uL (ref 0.0–0.5)
Eosinophils Relative: 1 %
HCT: 30.1 % — ABNORMAL LOW (ref 39.0–52.0)
Hemoglobin: 10.2 g/dL — ABNORMAL LOW (ref 13.0–17.0)
Immature Granulocytes: 0 %
Lymphocytes Relative: 7 %
Lymphs Abs: 0.3 K/uL — ABNORMAL LOW (ref 0.7–4.0)
MCH: 33.4 pg (ref 26.0–34.0)
MCHC: 33.9 g/dL (ref 30.0–36.0)
MCV: 98.7 fL (ref 80.0–100.0)
Monocytes Absolute: 0.3 K/uL (ref 0.1–1.0)
Monocytes Relative: 7 %
Neutro Abs: 3.5 K/uL (ref 1.7–7.7)
Neutrophils Relative %: 85 %
Platelets: 106 K/uL — ABNORMAL LOW (ref 150–400)
RBC: 3.05 MIL/uL — ABNORMAL LOW (ref 4.22–5.81)
RDW: 15.5 % (ref 11.5–15.5)
WBC: 4.1 K/uL (ref 4.0–10.5)
nRBC: 0 % (ref 0.0–0.2)

## 2024-12-12 LAB — COMPREHENSIVE METABOLIC PANEL WITH GFR
ALT: 27 U/L (ref 0–44)
AST: 23 U/L (ref 15–41)
Albumin: 3.4 g/dL — ABNORMAL LOW (ref 3.5–5.0)
Alkaline Phosphatase: 39 U/L (ref 38–126)
Anion gap: 12 (ref 5–15)
BUN: 12 mg/dL (ref 8–23)
CO2: 20 mmol/L — ABNORMAL LOW (ref 22–32)
Calcium: 8 mg/dL — ABNORMAL LOW (ref 8.9–10.3)
Chloride: 106 mmol/L (ref 98–111)
Creatinine, Ser: 1.03 mg/dL (ref 0.61–1.24)
GFR, Estimated: >60 mL/min
Glucose, Bld: 101 mg/dL — ABNORMAL HIGH (ref 70–99)
Potassium: 3.5 mmol/L (ref 3.5–5.1)
Sodium: 139 mmol/L (ref 135–145)
Total Bilirubin: 0.6 mg/dL (ref 0.0–1.2)
Total Protein: 6 g/dL — ABNORMAL LOW (ref 6.5–8.1)

## 2024-12-12 LAB — MAGNESIUM: Magnesium: 1.9 mg/dL (ref 1.7–2.4)

## 2024-12-12 MED ORDER — DEXTROSE 5 % IV SOLN
56.0000 mg/m2 | Freq: Once | INTRAVENOUS | Status: AC
  Administered 2024-12-12: 120 mg via INTRAVENOUS
  Filled 2024-12-12: qty 60

## 2024-12-12 MED ORDER — SODIUM CHLORIDE 0.9 % IV SOLN: Freq: Once | INTRAVENOUS | Status: AC

## 2024-12-12 MED ORDER — DENOSUMAB 120 MG/1.7ML ~~LOC~~ SOLN
120.0000 mg | Freq: Once | SUBCUTANEOUS | Status: AC
  Administered 2024-12-12: 120 mg via SUBCUTANEOUS
  Filled 2024-12-12: qty 1.7

## 2024-12-12 MED ORDER — SODIUM CHLORIDE 0.9 % IV SOLN: INTRAVENOUS | Status: DC

## 2024-12-12 MED ORDER — DEXAMETHASONE 4 MG PO TABS
20.0000 mg | ORAL_TABLET | Freq: Once | ORAL | Status: AC
  Administered 2024-12-12: 20 mg via ORAL
  Filled 2024-12-12: qty 5

## 2024-12-12 NOTE — Patient Instructions (Signed)
 [facility] [place] - A DEPT OF [facility]  Discharge Instructions: Thank you for choosing [facility] to provide your oncology and hematology care.  If you have a lab appointment with the Cancer Center - please note that after April 8th, 2024, all labs will be drawn in the cancer center.  You do not have to check in or register with the main entrance as you have in the past but will complete your check-in in the cancer center.  Wear comfortable clothing and clothing appropriate for easy access to any Portacath or PICC line.   We strive to give you quality time with your provider. You may need to reschedule your appointment if you arrive late (15 or more minutes).  Arriving late affects you and other patients whose appointments are after yours.  Also, if you miss three or more appointments without notifying the office, you may be dismissed from the clinic at the provider's discretion.      For prescription refill requests, have your pharmacy contact our office and allow 72 hours for refills to be completed.    Today you received the following chemotherapy and/or immunotherapy agents Kyprolis       To help prevent nausea and vomiting after your treatment, we encourage you to take your nausea medication as directed.  BELOW ARE SYMPTOMS THAT SHOULD BE REPORTED IMMEDIATELY: *FEVER GREATER THAN 100.4 F (38 C) OR HIGHER *CHILLS OR SWEATING *NAUSEA AND VOMITING THAT IS NOT CONTROLLED WITH YOUR NAUSEA MEDICATION *UNUSUAL SHORTNESS OF BREATH *UNUSUAL BRUISING OR BLEEDING *URINARY PROBLEMS (pain or burning when urinating, or frequent urination) *BOWEL PROBLEMS (unusual diarrhea, constipation, pain near the anus) TENDERNESS IN MOUTH AND THROAT WITH OR WITHOUT PRESENCE OF ULCERS (sore throat, sores in mouth, or a toothache) UNUSUAL RASH, SWELLING OR PAIN  UNUSUAL VAGINAL DISCHARGE OR ITCHING   Items with * indicate a potential emergency and should be followed up  as soon as possible or go to the Emergency Department if any problems should occur.  Please show the CHEMOTHERAPY ALERT CARD or IMMUNOTHERAPY ALERT CARD at check-in to the Emergency Department and triage nurse.  Should you have questions after your visit or need to cancel or reschedule your appointment, please contact [facility] [place] - A DEPT OF [facility] [phone]  and follow the prompts.  Office hours are 8:00 a.m. to 4:30 p.m. Monday - Friday. Please note that voicemails left after 4:00 p.m. may not be returned until the following business day.  We are closed weekends and major holidays. You have access to a nurse at all times for urgent questions. Please call the main number to the clinic [phone] and follow the prompts.  For any non-urgent questions, you may also contact your provider using MyChart. We now offer e-Visits for anyone 25 and older to request care online for non-urgent symptoms. For details visit [email or web address].   Also download the MyChart app! Go to the app store, search MyChart, open the app, select [place], and log in with your MyChart username and password.

## 2024-12-12 NOTE — Telephone Encounter (Signed)
 [facility] for status [facility], they report [facility]08/[facility]24, co-pay $0

## 2024-12-12 NOTE — Progress Notes (Signed)
 Patient presents today for chemotherapy Kyprolis  infusion. Patient is in satisfactory condition with no new complaints voiced.  Vital signs are stable.  Labs reviewed by Dr. Davonna during the office visit and all labs are within treatment parameters. Patient will receive 1 L of NS over 1 hour. We will proceed with treatment per MD orders.   Patient will also receive Xgeva  120 mg. Patient denies any tooth or jaw pain. No recent or future major dental appointments. Patient reports taking Calcium /Vit D supplements. Patient's Calcium  noted to be 8.0.   Treatment given today per MD orders. Tolerated infusion without adverse affects. Vital signs stable. No complaints at this time. Discharged from clinic ambulatory in stable condition. Alert and oriented x 3. F/U with [facility] as scheduled.

## 2024-12-12 NOTE — Patient Instructions (Signed)
 [facility] at [facility] Discharge Instructions   You were seen and examined today by Dr. Davonna.  She reviewed the results of your lab work which are normal/stable.   We will arrange for you to have a biopsy with interventional radiology of the right retroperitoneal mass.   We will proceed with your treatment today.   Return as scheduled.    Thank you for choosing [facility] at [facility] to provide your oncology and hematology care.  To afford each patient quality time with our provider, please arrive at least 15 minutes before your scheduled appointment time.   If you have a lab appointment with the Cancer Center please come in thru the Main Entrance and check in at the main information desk.  You need to re-schedule your appointment should you arrive 10 or more minutes late.  We strive to give you quality time with our providers, and arriving late affects you and other patients whose appointments are after yours.  Also, if you no show three or more times for appointments you may be dismissed from the clinic at the providers discretion.     Again, thank you for choosing [facility].  Our hope is that these requests will decrease the amount of time that you wait before being seen by our physicians.       _____________________________________________________________  Should you have questions after your visit to [facility], please contact our office at [phone] and follow the prompts.  Our office hours are 8:00 a.m. and 4:30 p.m. Monday - Friday.  Please note that voicemails left after 4:00 p.m. may not be returned until the following business day.  We are closed weekends and major holidays.  You do have access to a nurse 24-7, just call the main number to the clinic [phone] and do not press any options, hold on the line and a nurse will answer the phone.    For prescription refill requests, have your  pharmacy contact our office and allow 72 hours.    Due to Covid, you will need to wear a mask upon entering the hospital. If you do not have a mask, a mask will be given to you at the Main Entrance upon arrival. For doctor visits, patients may have 1 support person age 32 or older with them. For treatment visits, patients can not have anyone with them due to social distancing guidelines and our immunocompromised population.

## 2024-12-12 NOTE — Progress Notes (Signed)
 Patient took 20 mg Dexamethasone  orally prior to arrival.  Will give additional 20 mg Dexamethasone  orally x 1 prior to Kyprolis .  Niels Molt, PharmD Clinical Pharmacist

## 2024-12-13 ENCOUNTER — Other Ambulatory Visit (HOSPITAL_COMMUNITY): Payer: Self-pay

## 2024-12-13 ENCOUNTER — Telehealth: Payer: Self-pay | Admitting: Pharmacy Technician

## 2024-12-13 NOTE — Telephone Encounter (Signed)
 Opened in error

## 2024-12-13 NOTE — Progress Notes (Unsigned)
 Vanice Sharper, MD  Baldwin Channing LITTIE Manuelita for CT bx Rt retropertioneal mass.    See image 40/3 CT of 12/09/24 CT, prone right para caval target below renal vessels  TS       Previous Messages    ----- Message ----- From: Baldwin Channing LITTIE Sent: 12/12/2024   1:49 PM EST To: Channing LITTIE Baldwin; Taryn F Rigney, RT; Ir Proc* Subject: CT BIOPSY                                      Procedure :CT BIOPSY  Reason :biopsy right retroperitoneum mass Dx: Retroperitoneal mass [R19.00 (ICD-10-CM)]    History :Acute Abdominal Series ,DG Chest Portable 1 View,CT ABDOMEN PELVIS W CONTRAST,CT Chest W Contrast ,NM PET Image Restage (PS) Whole Body  Provider:Kandala, Mickiel, MD  Provider contact ; [phone]

## 2024-12-15 ENCOUNTER — Encounter (HOSPITAL_COMMUNITY): Payer: Self-pay | Admitting: Oncology

## 2024-12-15 ENCOUNTER — Encounter: Payer: Self-pay | Admitting: Oncology

## 2024-12-15 NOTE — Progress Notes (Signed)
 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 male with metastatic castrate sensitive prostate cancer and IgG kappa multiple myeloma  Assessment and Plan  Multiple myeloma not having achieved remission IgG kappa multiple myeloma (standard cytogenetics) s/p induction with 8 cycles of D-KRD Patient had stem cells collected but did not undergo stem cell transplant Was on maintenance Revlimid  prior to biochemical recurrence recently Started on daratumumab  and was evaluated for CAR-T by Dr. Fernande at  [facility] and planned for CAR-T infusion on 01/11/2024   - Patient had CAR-T cell harvesting done on 11/24/2024 - Started on Carfilzomib , Pomalyst  and dexamethasone  as bridging therapy during the wait time prior to CAR-T infusion. Patient had severe diarrhea with pomalyst  and held it after 1 dose. Continue to hold at this time -Labs reviewed today: CMP: Rm:wnmfjo, Normal LFTs, CBC: Hb:10.2, PLTs:106, WBC:4.1. -Physical exam stable today. Okay to proceed with Carfilzomib  infusion - Patient had a retropritoneal mass diagnosed in a recent CT scan. Will discuss this with Dr.McKay.  Return to clinic for follow-up after leukapheresis for continuation of treatment.  Retroperitoneal mass Recent CT scan showed abnormal mass or masslike process in the right retroperitoneum that appears to involve the 2nd and 3rd portion of duodenum resulting in upstream obstruction of stomach and  duodenal bulb. Surgical consultation was obtained at that time.  Considering that this mass is encasing some of the renal vessels, it is not easily accessible surgically for any biopsy or removal.  -Will have IR referral for biopsy.  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  Last dose 10/02/2024 - Continue denosumab  injection 120mg  monthly - Continue vitamin D  and calcium  supplementation -Continue to follow with urology  Iron deficiency anemia Iron deficiency anemia managed with oral iron. Constipation noted with daily intake. - Continue iron supplementation every other day.  Chronic diarrhea Chronic diarrhea managed with Imodium. - Continue Imodium as needed for diarrhea.   The patient understands the plans discussed today and is in agreement with them.  He knows to contact our office if he develops concerns prior to his next appointment.  The total time spent in the appointment was 20 minutes for the encounter with patient, including review of chart and various tests results, discussions about plan of care and coordination of care plan  Mickiel Dry, MD  [facility] [place] - A DEPT OF [facility] [address] [place] [address] Dept: [phone] Dept Fax: [phone]   Orders Placed This Encounter  Procedures   CT BIOPSY    Standing Status:   Future    Expected Date:   12/19/2024    Expiration Date:   12/12/2025    Lab orders requested (DO NOT place separate lab orders, these will be automatically ordered during procedure specimen collection)::   Surgical Pathology  Reason for Exam (SYMPTOM  OR DIAGNOSIS REQUIRED):   biopsy right retroperitoneum mass    Preferred location?:   [facility]     ONCOLOGY HISTORY:   I have reviewed his  chart and materials related to his cancer extensively and collaborated history with the patient. 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  Pathology: 1/12 cores positive for prostatic adenocarcinoma. Gleason score 3+3=6 involves 10% of Right lateral base core.  Prostate volume 175 cc. - 06/20/2015: PSA 16.66.  PSA density 0.18.  -10/09/2015: PSA 17.16 -12/18/2015: Prostate MRI: No findings specific for macroscopic prostate cancer.  -01/03/2016: Patient evaluated by Dr. Cam [Urology] for robotic prostatectomy and felt not to be a candidate for surgery due to low-volume prostate cancer and low PSA density. Dr. Cam recommended active surveillance.  -01/22/2016: Surveillance TRUS and biopsy.  Pathology: All 12 cores showed benign prostatic tissue. Prostate volume was 190 mL.  -2017-2022: Patient remained under surveillance with TRUS/biopsy and monitoring PSA -11/04/2016: PSA 15.4 -05/15/2017: PSA 17.9 -04/19/2018: PSA 18.4 -08/11/2018: Fusion TRUS and biopsy.  Pathology: 1/12 cores showed small focus of prostatic adenocarcinoma, Gleason score 3+3=6 involves 5% of left apex core with cancer length of 0.09 cm. Prostate volume 220 mL. No significant lesions.  No evidence of transcapsular, ureteral, SV/bony or lymphatic disease.  -02/15/2019: PSA 15.9 -03/20/2020: PSA 15.5 -03/20/2021: PSA 22.4 -09/18/2021: PSA 34.5 -11/27/2021: MRI lumbar spine: Expanded appearance of the sacrum with diffusely abnormal bone marrow signal and contrast enhancement, concerning for metastatic disease. -11/27/2021: CT pelvis: Extreme prostatomegaly. No evidence of pelvic lymphadenopathy or other findings to suggest metastatic disease. -03/13/2022: PSMA PET: Multifocal radiotracer avid prostate cancer skeletal metastasis. Broad lytic lesion within the sacrum with associated radiotracer activity. Multiple  discrete lesions in the thoracic lumbar spine and ribs with minimal CT change. Markedly enlarged prostate gland. Focal activity inferior LEFT aspect the gland is suggestive of prostate adenocarcinoma. No nodal metastasis in the abdomen pelvis. No visceral metastasis. -04/15/2022-11/11/2022: Degarelix  -05/06/2022-06/30/2024: PSA: 27.67-->0.26 -02/18/2023-current: Monthly Xgeva  -03/26/2023-current: Eligard  every 6 months   Diagnosis: IgG kappa multiple myeloma with complex cytogenetics   -02/26/2022: Initial Myeloma labs showed IgG monoclonal protein with kappa light chain specificity. IgG 4,990. FLC ratio 106.24 with elevated kappa light chains at 626.8. M spike 4.3. Gamma globulin 4.6. LDH 160.  -03/20/2022: Bone Marrow Biopsy.  -Pathology: The bone marrow core biopsy demonstrates hypercellular marrow for age (95%) with large and atypical appearing plasma cells arranged in sheets and attenuating the normal hematopoietic elements. By CD138 immunohistochemistry plasma cells comprise approximately 70% of the cellular elements and are kappa-restricted by light chain in situ hybridization.  The plasma cells do not express CD56.  Amyloid deposition is not seen by Congo red or crystal violet stain.  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  -04/15/2022-11/11/2022: 8 cycles of Dara KRD completed -10/2022- 11/02/2024: Maintenance Revlimid  -11/25/2022: Stem cell collection has been completed at [facility]. Patient decided not to undergo stem cell transplant. -06/30/2024-10/19/2024: Daratumumab  added back to regimen for worsening M spike -07/29/2024: Consultation with Dr.McKay and patient is willing to proceed with CART -11/24/2024: CART collection -12/05/2024- Current: Bridging therapy with  Carfilzomib  + pomalyst  + dexamethasone   Current Treatment:  Carfilzomib  +  Pomalyst  + dexamethasone   INTERVAL HISTORY:   Luis Stewart is a 73 y.o. male presenting to the clinic today for carfilzomib  infusion and follow-up.  He is accompanied by his wife today. Patient had severe diarrhea after 1 dose of Pomalyst  and held it since then.  He went to the ER and was evaluated at that time.  CT scan showed a retroperitoneal mass.  Patient since then has improved significantly.  Denies abdominal pain, diarrhea at this time.  He had CAR-T collection on 11/24/2024.  He is planned to get infusion on 12/31/2023    I have reviewed the past medical history, past surgical history, social history and family history with the patient and they are unchanged from previous note.  ALLERGIES:  is allergic to sulfa antibiotics.  MEDICATIONS:  Current Outpatient Medications  Medication Sig Dispense Refill   acetaminophen  (TYLENOL ) 500 MG tablet Take 1,000 mg by mouth every 6 (six) hours as needed for mild pain or moderate pain.     acyclovir  (ZOVIRAX ) 400 MG tablet Take 1 tablet (400 mg total) by mouth 2 (two) times daily.     albuterol  (VENTOLIN  HFA) 108 (90 Base) MCG/ACT inhaler Inhale 2 puffs into the lungs every 6 (six) hours as needed for wheezing or shortness of breath. 18 g 0   Cholecalciferol (VITAMIN D ) 2000 units CAPS Take 2,000 Units by mouth every other day.     dexamethasone  (DECADRON ) 4 MG tablet Take 20 mg 30 minutes prior to treatment (Patient taking differently: Take 20 mg by mouth See admin instructions. Take 20 mg 30 minutes prior to treatment) 5 tablet 3   DULoxetine  (CYMBALTA ) 60 MG capsule TAKE 1 CAPSULE BY MOUTH EVERY DAY 90 capsule 2   ferrous sulfate 325 (65 FE) MG EC tablet Take 325 mg by mouth daily.     lidocaine -prilocaine  (EMLA ) cream Apply to affected area once 30 g 3   Multiple Vitamin (MULTIVITAMIN WITH MINERALS) TABS tablet Take 1 tablet by mouth daily with  breakfast. Centrum Silver for Men     nitroGLYCERIN  (NITROSTAT ) 0.4 MG SL tablet Place 1 tablet (0.4 mg total) under the tongue every 5 (five) minutes x 3 doses as needed for chest pain (If no relief after 3rd dose, call or go to ED.). 30 tablet 0   ondansetron  (ZOFRAN ) 8 MG tablet Take 1 tablet (8 mg total) by mouth every 8 (eight) hours as needed for nausea or vomiting. 30 tablet 1   pantoprazole  (PROTONIX ) 40 MG tablet Take 1 tablet (40 mg total) by mouth daily.     pomalidomide  (POMALYST ) 3 MG capsule Take 1 capsule (3 mg total) by mouth daily. 21 days on, 7 days off. 21 capsule 0   potassium chloride  (KLOR-CON ) 10 MEQ tablet Take 1 tablet (10 mEq total) by mouth daily. 90 tablet 1   prochlorperazine  (COMPAZINE ) 10 MG tablet Take 1 tablet (10 mg total) by mouth every 6 (six) hours as needed for nausea or vomiting. 30 tablet 1   RESTASIS 0.05 % ophthalmic emulsion Place 1 drop into both eyes 2 (two) times daily.     rosuvastatin  (CRESTOR ) 5 MG tablet TAKE 1 TABLET (5 MG TOTAL) BY MOUTH DAILY. 90 tablet 3   No current facility-administered medications for this visit.   Facility-Administered Medications Ordered in Other Visits  Medication Dose Route Frequency Provider Last Rate Last Admin   0.9 %  sodium chloride  infusion   Intravenous Continuous Lorriann Hansmann, MD   Stopped at 12/05/24 1525  REVIEW OF SYSTEMS:   All the systems were reviewed with the patient and are negative except HPI.   VITALS:  There were no vitals taken for this visit.  Wt Readings from Last 3 Encounters:  12/12/24 180 lb 1.9 oz (81.7 kg)  12/09/24 179 lb (81.2 kg)  12/05/24 182 lb 6.4 oz (82.7 kg)    There is no height or weight on file to calculate BMI.  Performance status (ECOG): 1 - Symptomatic but completely ambulatory  PHYSICAL EXAM:   GENERAL:alert, no distress and comfortable SKIN: skin color, texture, turgor are normal, no rashes or significant lesions LYMPH:  no palpable lymphadenopathy in the  cervical, axillary or inguinal LUNGS: clear to auscultation and percussion with normal breathing effort HEART: regular rate & rhythm and no murmurs and no lower extremity edema ABDOMEN:abdomen soft, non-tender and normal bowel sounds Musculoskeletal:No tenderness in the left groin area. NEURO: alert & oriented x 3 with fluent speech  LABORATORY DATA:  I have reviewed the data as listed Lab Results  Component Value Date   WBC 4.1 12/12/2024   NEUTROABS 3.5 12/12/2024   HGB 10.2 (L) 12/12/2024   HCT 30.1 (L) 12/12/2024   MCV 98.7 12/12/2024   PLT 106 (L) 12/12/2024      Chemistry      Component Value Date/Time   NA 139 12/12/2024 1151   NA 138 02/06/2022 0940   K 3.5 12/12/2024 1151   CL 106 12/12/2024 1151   CO2 20 (L) 12/12/2024 1151   BUN 12 12/12/2024 1151   BUN 18 02/06/2022 0940   CREATININE 1.03 12/12/2024 1151   CREATININE 1.23 11/22/2021 1309      Component Value Date/Time   CALCIUM  8.0 (L) 12/12/2024 1151   ALKPHOS 39 12/12/2024 1151   AST 23 12/12/2024 1151   ALT 27 12/12/2024 1151   BILITOT 0.6 12/12/2024 1151   BILITOT 0.3 02/06/2022 0940      Latest Reference Range & Units 09/15/24 14:44  Total Protein ELP 6.0 - 8.5 g/dL 5.5 (L)  Albumin ELP 2.9 - 4.4 g/dL 3.1  Globulin, Total 2.2 - 3.9 g/dL 2.4 (C)  A/G Ratio 0.7 - 1.7  1.3 (C)  Alpha-1-Globulin 0.0 - 0.4 g/dL 0.2  Joeyj-7-Honalopw 0.4 - 1.0 g/dL 0.7  Beta Globulin 0.7 - 1.3 g/dL 0.8  Gamma Globulin 0.4 - 1.8 g/dL 0.7  M-SPIKE, % Not Observed g/dL 0.5 (H)  (L): Data is abnormally low (H): Data is abnormally high (C): Corrected   Latest Reference Range & Units 09/15/24 14:44  Kappa free light chain 3.3 - 19.4 mg/L 29.3 (H)  Lambda free light chains 5.7 - 26.3 mg/L 7.9  Kappa, lambda light chain ratio 0.26 - 1.65  3.71 (H)  (H): Data is abnormally high:  RADIOGRAPHIC STUDIES: I have personally reviewed the radiological images as listed and agreed with the findings in the report.  Acute  Abdominal Series EXAM: UPRIGHT AND SUPINE XRAY VIEWS OF THE ABDOMEN AND 4 VIEW(S) OF THE CHEST 12/11/2024 12:42:07 AM  COMPARISON: 12/10/2024  CLINICAL HISTORY: Small bowel obstruction (HCC)  FINDINGS:  LUNGS AND PLEURA: Left basilar opacity. Probable small left pleural effusion. No consolidation or pulmonary edema. No pneumothorax.  HEART AND MEDIASTINUM: Left chest port remains in place. No acute abnormality of the cardiac and mediastinal silhouettes.  BOWEL: Scattered loops of large and small bowel are noted. No obstructive changes are noted.  PERITONEUM AND SOFT TISSUES: No abnormal calcifications. No free air.  BONES: No acute osseous abnormality.  IMPRESSION: 1. No radiographic bowel obstruction or free air. 2. Left basilar opacity and probable small left pleural effusion.  Electronically signed by: Oneil Devonshire MD 12/11/2024 12:54 AM EST RP Workstation: [id number]   "

## 2024-12-19 ENCOUNTER — Encounter: Payer: Self-pay | Admitting: *Deleted

## 2024-12-19 ENCOUNTER — Inpatient Hospital Stay

## 2024-12-19 ENCOUNTER — Telehealth: Payer: Self-pay | Admitting: *Deleted

## 2024-12-19 VITALS — BP 122/66 | HR 76 | Temp 98.7°F | Resp 18

## 2024-12-19 DIAGNOSIS — Z5112 Encounter for antineoplastic immunotherapy: Secondary | ICD-10-CM | POA: Diagnosis not present

## 2024-12-19 DIAGNOSIS — C61 Malignant neoplasm of prostate: Secondary | ICD-10-CM

## 2024-12-19 DIAGNOSIS — C9000 Multiple myeloma not having achieved remission: Secondary | ICD-10-CM

## 2024-12-19 DIAGNOSIS — E876 Hypokalemia: Secondary | ICD-10-CM

## 2024-12-19 LAB — COMPREHENSIVE METABOLIC PANEL WITH GFR
ALT: 18 U/L (ref 0–44)
AST: 17 U/L (ref 15–41)
Albumin: 3.5 g/dL (ref 3.5–5.0)
Alkaline Phosphatase: 43 U/L (ref 38–126)
Anion gap: 13 (ref 5–15)
BUN: 9 mg/dL (ref 8–23)
CO2: 20 mmol/L — ABNORMAL LOW (ref 22–32)
Calcium: 8 mg/dL — ABNORMAL LOW (ref 8.9–10.3)
Chloride: 104 mmol/L (ref 98–111)
Creatinine, Ser: 0.85 mg/dL (ref 0.61–1.24)
GFR, Estimated: >60 mL/min
Glucose, Bld: 103 mg/dL — ABNORMAL HIGH (ref 70–99)
Potassium: 3.4 mmol/L — ABNORMAL LOW (ref 3.5–5.1)
Sodium: 136 mmol/L (ref 135–145)
Total Bilirubin: 0.4 mg/dL (ref 0.0–1.2)
Total Protein: 5.8 g/dL — ABNORMAL LOW (ref 6.5–8.1)

## 2024-12-19 LAB — CBC WITH DIFFERENTIAL/PLATELET
Abs Immature Granulocytes: 0.01 K/uL (ref 0.00–0.07)
Basophils Absolute: 0 K/uL (ref 0.0–0.1)
Basophils Relative: 0 %
Eosinophils Absolute: 0 K/uL (ref 0.0–0.5)
Eosinophils Relative: 1 %
HCT: 28.1 % — ABNORMAL LOW (ref 39.0–52.0)
Hemoglobin: 9.6 g/dL — ABNORMAL LOW (ref 13.0–17.0)
Immature Granulocytes: 0 %
Lymphocytes Relative: 8 %
Lymphs Abs: 0.2 K/uL — ABNORMAL LOW (ref 0.7–4.0)
MCH: 33.3 pg (ref 26.0–34.0)
MCHC: 34.2 g/dL (ref 30.0–36.0)
MCV: 97.6 fL (ref 80.0–100.0)
Monocytes Absolute: 0.3 K/uL (ref 0.1–1.0)
Monocytes Relative: 13 %
Neutro Abs: 1.7 K/uL (ref 1.7–7.7)
Neutrophils Relative %: 78 %
Platelets: 57 K/uL — ABNORMAL LOW (ref 150–400)
RBC: 2.88 MIL/uL — ABNORMAL LOW (ref 4.22–5.81)
RDW: 15.4 % (ref 11.5–15.5)
WBC: 2.3 K/uL — ABNORMAL LOW (ref 4.0–10.5)
nRBC: 0 % (ref 0.0–0.2)

## 2024-12-19 LAB — MAGNESIUM: Magnesium: 1.9 mg/dL (ref 1.7–2.4)

## 2024-12-19 MED ORDER — POTASSIUM CHLORIDE CRYS ER 20 MEQ PO TBCR
40.0000 meq | EXTENDED_RELEASE_TABLET | Freq: Once | ORAL | Status: AC
  Administered 2024-12-19: 40 meq via ORAL
  Filled 2024-12-19: qty 2

## 2024-12-19 NOTE — Telephone Encounter (Signed)
 Per Dr. Fernande @ [facility], will hold planned treatment for today and continue to hold Pomalyst  due to low platelet count. Patient has bx scheduled for 1/5 and treatment today may interfere with the ability to obtain biopsy.  Dr. Davonna made aware as well as patient.

## 2024-12-19 NOTE — Progress Notes (Signed)
 Patient presents today for chemotherapy infusion of Kyprolis .  Patient is in satisfactory condition with no new complaints voiced.  Vital signs are stable.  Labs reviewed, platelets 57. Dr Davonna made aware. Dr Davonna consulted with Dr Fernande, Patient's treatment held today per Dr Davonna. Patient potassium 3.4, 40 mEq of Kdur given per MDs order.   Patient left ambulatory in stable condition.  Vital signs stable at discharge.  Follow up as scheduled.

## 2024-12-19 NOTE — Patient Instructions (Signed)
 [facility] [place] - A DEPT OF [place] [facility]  Discharge Instructions: Thank you for choosing [facility] to provide your oncology and hematology care.  If you have a lab appointment with the Cancer Center - please note that after April 8th, 2024, all labs will be drawn in the cancer center.  You do not have to check in or register with the main entrance as you have in the past but will complete your check-in in the cancer center.  Wear comfortable clothing and clothing appropriate for easy access to any Portacath or PICC line.   We strive to give you quality time with your provider. You may need to reschedule your appointment if you arrive late (15 or more minutes).  Arriving late affects you and other patients whose appointments are after yours.  Also, if you miss three or more appointments without notifying the office, you may be dismissed from the clinic at the providers discretion.      For prescription refill requests, have your pharmacy contact our office and allow 72 hours for refills to be completed.    Today you received the following potassium 40mEq due to potassium 3.4. Patient's Kyprolis  held today due to platelet count 57,000.   To help prevent nausea and vomiting after your treatment, we encourage you to take your nausea medication as directed.  BELOW ARE SYMPTOMS THAT SHOULD BE REPORTED IMMEDIATELY: *FEVER GREATER THAN 100.4 F (38 C) OR HIGHER *CHILLS OR SWEATING *NAUSEA AND VOMITING THAT IS NOT CONTROLLED WITH YOUR NAUSEA MEDICATION *UNUSUAL SHORTNESS OF BREATH *UNUSUAL BRUISING OR BLEEDING *URINARY PROBLEMS (pain or burning when urinating, or frequent urination) *BOWEL PROBLEMS (unusual diarrhea, constipation, pain near the anus) TENDERNESS IN MOUTH AND THROAT WITH OR WITHOUT PRESENCE OF ULCERS (sore throat, sores in mouth, or a toothache) UNUSUAL RASH, SWELLING OR PAIN  UNUSUAL VAGINAL DISCHARGE OR ITCHING   Items with * indicate a  potential emergency and should be followed up as soon as possible or go to the Emergency Department if any problems should occur.  Please show the CHEMOTHERAPY ALERT CARD or IMMUNOTHERAPY ALERT CARD at check-in to the Emergency Department and triage nurse.  Should you have questions after your visit or need to cancel or reschedule your appointment, please contact [facility]  - A DEPT OF [facility] [phone]  and follow the prompts.  Office hours are 8:00 a.m. to 4:30 p.m. Monday - Friday. Please note that voicemails left after 4:00 p.m. may not be returned until the following business day.  We are closed weekends and major holidays. You have access to a nurse at all times for urgent questions. Please call the main number to the clinic [phone] and follow the prompts.  For any non-urgent questions, you may also contact your provider using MyChart. We now offer e-Visits for anyone 78 and older to request care online for non-urgent symptoms. For details visit [email or web address].   Also download the MyChart app! Go to the app store, search MyChart, open the app, select [place], and log in with your MyChart username and password.

## 2024-12-23 ENCOUNTER — Encounter: Payer: Self-pay | Admitting: Oncology

## 2024-12-26 ENCOUNTER — Other Ambulatory Visit: Payer: Self-pay

## 2024-12-26 ENCOUNTER — Encounter (HOSPITAL_COMMUNITY): Payer: Self-pay

## 2024-12-26 ENCOUNTER — Other Ambulatory Visit: Payer: Self-pay | Admitting: Oncology

## 2024-12-26 ENCOUNTER — Ambulatory Visit (HOSPITAL_COMMUNITY)
Admission: RE | Admit: 2024-12-26 | Discharge: 2024-12-26 | Disposition: A | Discharge status: Home / Self Care | Source: Ambulatory Visit | Attending: Oncology | Admitting: Oncology

## 2024-12-26 ENCOUNTER — Telehealth: Payer: Self-pay | Admitting: *Deleted

## 2024-12-26 VITALS — BP 119/63 | HR 81 | Temp 98.1°F | Resp 20 | Ht 74.0 in | Wt 175.0 lb

## 2024-12-26 DIAGNOSIS — Z8546 Personal history of malignant neoplasm of prostate: Secondary | ICD-10-CM | POA: Diagnosis not present

## 2024-12-26 DIAGNOSIS — R1900 Intra-abdominal and pelvic swelling, mass and lump, unspecified site: Secondary | ICD-10-CM | POA: Diagnosis present

## 2024-12-26 DIAGNOSIS — D509 Iron deficiency anemia, unspecified: Secondary | ICD-10-CM

## 2024-12-26 DIAGNOSIS — Z9889 Other specified postprocedural states: Secondary | ICD-10-CM | POA: Insufficient documentation

## 2024-12-26 DIAGNOSIS — Z5309 Procedure and treatment not carried out because of other contraindication: Secondary | ICD-10-CM | POA: Insufficient documentation

## 2024-12-26 DIAGNOSIS — C61 Malignant neoplasm of prostate: Secondary | ICD-10-CM

## 2024-12-26 DIAGNOSIS — C9000 Multiple myeloma not having achieved remission: Secondary | ICD-10-CM

## 2024-12-26 DIAGNOSIS — Z79899 Other long term (current) drug therapy: Secondary | ICD-10-CM | POA: Diagnosis not present

## 2024-12-26 DIAGNOSIS — Z01812 Encounter for preprocedural laboratory examination: Secondary | ICD-10-CM | POA: Diagnosis present

## 2024-12-26 DIAGNOSIS — E785 Hyperlipidemia, unspecified: Secondary | ICD-10-CM | POA: Diagnosis not present

## 2024-12-26 DIAGNOSIS — Z01818 Encounter for other preprocedural examination: Secondary | ICD-10-CM | POA: Diagnosis present

## 2024-12-26 LAB — CBC
HCT: 31.5 % — ABNORMAL LOW (ref 39.0–52.0)
Hemoglobin: 10.7 g/dL — ABNORMAL LOW (ref 13.0–17.0)
MCH: 32.5 pg (ref 26.0–34.0)
MCHC: 34 g/dL (ref 30.0–36.0)
MCV: 95.7 fL (ref 80.0–100.0)
Platelets: 74 K/uL — ABNORMAL LOW (ref 150–400)
RBC: 3.29 MIL/uL — ABNORMAL LOW (ref 4.22–5.81)
RDW: 15.6 % — ABNORMAL HIGH (ref 11.5–15.5)
WBC: 2.1 K/uL — ABNORMAL LOW (ref 4.0–10.5)
nRBC: 0 % (ref 0.0–0.2)

## 2024-12-26 LAB — PROTIME-INR
INR: 1.2 (ref 0.8–1.2)
Prothrombin Time: 15.6 s — ABNORMAL HIGH (ref 11.4–15.2)

## 2024-12-26 MED ORDER — FENTANYL CITRATE (PF) 100 MCG/2ML IJ SOLN
INTRAMUSCULAR | Status: AC
  Filled 2024-12-26: qty 4

## 2024-12-26 MED ORDER — SODIUM CHLORIDE 0.9 % IV SOLN: INTRAVENOUS | Status: DC

## 2024-12-26 MED ORDER — IOHEXOL 350 MG/ML SOLN
75.0000 mL | Freq: Once | INTRAVENOUS | Status: AC | PRN
  Administered 2024-12-26: 75 mL via INTRAVENOUS

## 2024-12-26 MED ORDER — MIDAZOLAM HCL 2 MG/2ML IJ SOLN
INTRAMUSCULAR | Status: AC
  Filled 2024-12-26: qty 4

## 2024-12-26 MED ORDER — LIDOCAINE HCL (PF) 1 % IJ SOLN: 10.0000 mL | Freq: Once | INTRAMUSCULAR | Status: DC

## 2024-12-26 NOTE — Sedation Documentation (Signed)
 Procedure cancelled per Dr Philip, plan to d/c

## 2024-12-26 NOTE — Telephone Encounter (Signed)
 Call received from Tomi, RN at [facility] to relay that patient contacted their triage line yesterday reporting symptoms of reduced appetite and diarrhea. She stated that the patient was scheduled for a biopsy today and asked that we reach out to the patient to discuss symptoms and come up with a plan surrounding the biopsy.   According to care everywhere, the biopsy was scheduled in IR at [facility] this morning at 0752. I called the patient's phone number and spoke with his daughter, Paige Douse, who picked up on his behalf as he was already back in IR undergoing the scheduled biopsy.   She relayed that he is primarily complaining of a lack of appetite and feeling blah. He had not experienced any diarrhea this morning. I instructed Glynis to have Mr. Bathgate call Dr. Toniann office back if anything changed or he continued feeling poorly. I reviewed his upcoming appointments with her.  Dr. Fernande, based on this info, is Mr. Al ok to stay on track with his current plan (1/8 CAR T consent and 1/13 start lymphodepletion)? Any additional recommendations?

## 2024-12-26 NOTE — H&P (Addendum)
 "     Chief Complaint: Patient was seen in consultation today for retroperitoneal mass  Referring Physician(s): Davonna Siad  Supervising Physician: Hughes Simmonds  Patient Status: [facility] - Out-pt  History of Present Illness: Luis Stewart is a 74 y.o. male with history of hepatic and renal cysts, HLD, prostate cancer, and recent diagnosis of multiple myeloma with new finding of a large retroperitoneal mass by CT imaging.  Patient referred to IR for percutaneous biopsy at the request of Dr. Davonna. Case reviewed and approved by Dr. Vanice.   Patient presents to [facility] Radiology today in his usual state of health.  He has been having recent bowel changes, but is otherwise asymptomatic of his retroperitoneal mass.  He starts treatment for MM in the coming weeks.   Reviewed procedure, risks, and benefits.  He understand the goals and is agreeable to proceed.  He has arranged care and post-procedure care with his wife and daughter.   He is FULL CODE.   Past Medical History:  Diagnosis Date   Arthritis    Asbestosis (HCC)    Closed fracture of distal end of fibula with tibia with routine healing 05/28/2020   COVID-19 09/03/2021   Cyst of kidney, acquired    Cystic disease of liver    Enlarged prostate    Headache    History of fracture of leg    Right, childhood   Hx of migraine headaches    Hyperlipidemia    Multiple myeloma (HCC)    Neuropathy    PE (pulmonary thromboembolism) (HCC)    After knee surgery   Prostate cancer (HCC) 2015   Adenocarcinoma by biopsy    Past Surgical History:  Procedure Laterality Date   BIOPSY PROSTATE  2003 and 2005   COLONOSCOPY  11/28/2008   Dr. Rourk:internal hemorrhoids/tortus colon/ascending colon polyps, tubular adenomas   COLONOSCOPY N/A 09/04/2014   Procedure: COLONOSCOPY;  Surgeon: Lamar CHRISTELLA Hollingshead, MD;  [facility];  Service: Endoscopy;  Laterality: N/A;  9:30   COLONOSCOPY N/A 07/17/2017   Procedure: COLONOSCOPY;   Surgeon: Harvey Margo CROME, MD;  [facility]: [facility];  Service: Endoscopy;  Laterality: N/A;   COLONOSCOPY WITH PROPOFOL  N/A 10/05/2023   Procedure: COLONOSCOPY WITH PROPOFOL ;  Surgeon: Hollingshead Lamar CHRISTELLA, MD;  [facility]: [facility];  Service: Endoscopy;  Laterality: N/A;  10:00AM, ASA 3   ESOPHAGOGASTRODUODENOSCOPY N/A 07/16/2017   Procedure: ESOPHAGOGASTRODUODENOSCOPY (EGD);  Surgeon: Harvey Margo CROME, MD;  [facility];  Service: Endoscopy;  Laterality: N/A;   GIVENS CAPSULE STUDY  07/16/2017   Procedure: GIVENS CAPSULE STUDY;  Surgeon: Harvey Margo CROME, MD;  [facility]: [facility];  Service: Endoscopy;;   JOINT REPLACEMENT N/A    Phreesia 01/12/2021   POLYPECTOMY  07/17/2017   Procedure: POLYPECTOMY;  Surgeon: Harvey Margo CROME, MD;  [facility]: [facility];  Service: Endoscopy;;  cecal   POLYPECTOMY  10/05/2023   Procedure: POLYPECTOMY;  Surgeon: Hollingshead Lamar CHRISTELLA, MD;  Location: [facility];  Service: Endoscopy;;   PORTACATH PLACEMENT Left 04/18/2022   Procedure: INSERTION PORT-A-CATH;  Surgeon: Mavis Anes, MD;  Location: [facility];  Service: General;  Laterality: Left;   TOTAL KNEE ARTHROPLASTY Right 06/10/2017   TOTAL KNEE ARTHROPLASTY Right 06/10/2017   Procedure: TOTAL KNEE ARTHROPLASTY;  Surgeon: Beverley Toribio FALCON, MD;  Location: [facility] OR;  Service: Orthopedics;  Laterality: Right;    Allergies: Sulfa antibiotics  Medications: Prior to Admission medications  Medication Sig Start Date End Date Taking? Authorizing  Provider  acetaminophen  (TYLENOL ) 500 MG tablet Take 1,000 mg by mouth every 6 (six) hours as needed for mild pain or moderate pain.   Yes [provider]  acyclovir  (ZOVIRAX ) 400 MG tablet Take 1 tablet (400 mg total) by mouth 2 (two) times daily. 12/11/24  Yes Ricky Fines, MD  Cholecalciferol (VITAMIN D ) 2000 units CAPS Take 2,000 Units by mouth every other day.   Yes [provider]  dexamethasone  (DECADRON ) 4 MG tablet Take 20 mg 30 minutes  prior to treatment Patient taking differently: Take 20 mg by mouth See admin instructions. Take 20 mg 30 minutes prior to treatment 11/08/24  Yes Davonna Siad, MD  DULoxetine  (CYMBALTA ) 60 MG capsule TAKE 1 CAPSULE BY MOUTH EVERY DAY 11/29/24  Yes [name], MD  ferrous sulfate 325 (65 FE) MG EC tablet Take 325 mg by mouth daily.   Yes [provider]  Multiple Vitamin (MULTIVITAMIN WITH MINERALS) TABS tablet Take 1 tablet by mouth daily with breakfast. Centrum Silver for Men   Yes [provider]  ondansetron  (ZOFRAN ) 8 MG tablet Take 1 tablet (8 mg total) by mouth every 8 (eight) hours as needed for nausea or vomiting. 12/05/24  Yes Davonna Siad, MD  pantoprazole  (PROTONIX ) 40 MG tablet Take 1 tablet (40 mg total) by mouth daily. 12/11/24  Yes Ricky Fines, MD  pomalidomide  (POMALYST ) 3 MG capsule Take 1 capsule (3 mg total) by mouth daily. 21 days on, 7 days off. 12/02/24  Yes Davonna Siad, MD  potassium chloride  (KLOR-CON ) 10 MEQ tablet Take 1 tablet (10 mEq total) by mouth daily. 09/22/24 12/26/24 Yes Davonna Siad, MD  RESTASIS 0.05 % ophthalmic emulsion Place 1 drop into both eyes 2 (two) times daily. 10/18/23  Yes [provider]  rosuvastatin  (CRESTOR ) 5 MG tablet TAKE 1 TABLET (5 MG TOTAL) BY MOUTH DAILY. 03/30/24 10/05/25 Yes [name], MD  albuterol  (VENTOLIN  HFA) 108 (90 Base) MCG/ACT inhaler Inhale 2 puffs into the lungs every 6 (six) hours as needed for wheezing or shortness of breath. 09/06/24   [name], MD  lidocaine -prilocaine  (EMLA ) cream Apply to affected area once 12/05/24   Davonna Siad, MD  nitroGLYCERIN  (NITROSTAT ) 0.4 MG SL tablet Place 1 tablet (0.4 mg total) under the tongue every 5 (five) minutes x 3 doses as needed for chest pain (If no relief after 3rd dose, call or go to ED.). 11/20/22   Miriam Norris, NP  prochlorperazine  (COMPAZINE ) 10 MG tablet Take 1 tablet (10 mg total) by mouth every 6 (six) hours as  needed for nausea or vomiting. 12/05/24   Davonna Siad, MD     Family History  Problem Relation Age of Onset   Diabetes Mother    Hypertension Mother    Obesity Mother    Heart disease Mother    Mental illness Mother    Hypertension Sister    Obesity Sister    Arthritis Sister    Deep vein thrombosis Father    Dementia Father    Heart disease Father        CABG   Colon cancer Neg Hx     Social History   Socioeconomic History   Marital status: Married    Spouse name: Idell caldron   Number of children: Not on file   Years of education: Not on file   Highest education level: Not on file  Occupational History   Occupation: Retired  Tobacco Use   Smoking status: Never   Smokeless  tobacco: Never  Vaping Use   Vaping status: Never Used  Substance and Sexual Activity   Alcohol use: No   Drug use: No   Sexual activity: Yes  Other Topics Concern   Not on file  Social History Narrative   Previously worked at [employer]   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   Social Drivers of Health   Tobacco Use: Low Risk (12/26/2024)   Patient History    Smoking Tobacco Use: Never    Smokeless Tobacco Use: Never    Passive Exposure: Not on file  Financial Resource Strain: Low Risk (04/12/2024)   Overall Financial Resource Strain (CARDIA)    Difficulty of Paying Living Expenses: Not hard at all  Food Insecurity: No Food Insecurity (12/10/2024)   Epic    Worried About Radiation Protection Practitioner of Food in the Last Year: Never true    Ran Out of Food in the Last Year: Never true  Transportation Needs: No Transportation Needs (12/10/2024)   Epic    Lack of Transportation (Medical): No    Lack of Transportation (Non-Medical): No  Physical Activity: Sufficiently Active (04/12/2024)   Exercise Vital Sign    Days of Exercise per Week: 7 days    Minutes of Exercise per Session: 30 min  Stress: No Stress Concern Present (04/12/2024)   [employer] of Occupational  Health - Occupational Stress Questionnaire    Feeling of Stress : Not at all  Social Connections: Socially Integrated (12/10/2024)   Social Connection and Isolation Panel    Frequency of Communication with Friends and Family: More than three times a week    Frequency of Social Gatherings with Friends and Family: More than three times a week    Attends Religious Services: More than 4 times per year    Active Member of Clubs or Organizations: Yes    Attends Banker Meetings: More than 4 times per year    Marital Status: Married  Depression (PHQ2-9): Low Risk (12/19/2024)   Depression (PHQ2-9)    PHQ-2 Score: 0  Alcohol Screen: Low Risk (04/12/2024)   Alcohol Screen    Last Alcohol Screening Score (AUDIT): 0  Housing: Low Risk (12/10/2024)   Epic    Unable to Pay for Housing in the Last Year: No    Number of Times Moved in the Last Year: 0    Homeless in the Last Year: No  Utilities: Not At Risk (12/10/2024)   Epic    Threatened with loss of utilities: No  Health Literacy: Adequate Health Literacy (04/12/2024)   B1300 Health Literacy    Frequency of need for help with medical instructions: Never     Review of Systems: A 12 point ROS discussed and pertinent positives are indicated in the HPI above.  All other systems are negative.  Review of Systems  Constitutional:  Negative for fatigue and fever.  Respiratory:  Negative for cough and shortness of breath.   Cardiovascular:  Negative for chest pain.  Gastrointestinal:  Positive for constipation. Negative for abdominal pain.  Musculoskeletal:  Negative for back pain.  Psychiatric/Behavioral:  Negative for behavioral problems and confusion.     Vital Signs: BP 123/81   Pulse 80   Temp 98.1 F (36.7 C) (Oral)   Ht 6' 2 (1.88 m)   Wt 175 lb (79.4 kg)   SpO2 95%   BMI 22.47 kg/m   Physical Exam Vitals and nursing note reviewed.  Constitutional:  Appearance: Normal appearance.  HENT:     Mouth/Throat:      Mouth: Mucous membranes are moist.     Pharynx: Oropharynx is clear.  Cardiovascular:     Rate and Rhythm: Normal rate.  Pulmonary:     Effort: Pulmonary effort is normal.     Breath sounds: Normal breath sounds.  Abdominal:     Palpations: Abdomen is soft.  Neurological:     General: No focal deficit present.     Mental Status: He is alert and oriented to person, place, and time.  Psychiatric:        Mood and Affect: Mood normal.        Behavior: Behavior normal.        Thought Content: Thought content normal.        Judgment: Judgment normal.      MD Evaluation Airway: WNL Heart: WNL Abdomen: WNL Chest/ Lungs: WNL ASA  Classification: 3 Mallampati/Airway Score: Two   Imaging: Acute Abdominal Series Result Date: 12/11/2024 EXAM: UPRIGHT AND SUPINE XRAY VIEWS OF THE ABDOMEN AND 4 VIEW(S) OF THE CHEST 12/11/2024 12:42:07 AM COMPARISON: 12/10/2024 CLINICAL HISTORY: Small bowel obstruction (HCC) FINDINGS: LUNGS AND PLEURA: Left basilar opacity. Probable small left pleural effusion. No consolidation or pulmonary edema. No pneumothorax. HEART AND MEDIASTINUM: Left chest port remains in place. No acute abnormality of the cardiac and mediastinal silhouettes. BOWEL: Scattered loops of large and small bowel are noted. No obstructive changes are noted. PERITONEUM AND SOFT TISSUES: No abnormal calcifications. No free air. BONES: No acute osseous abnormality. IMPRESSION: 1. No radiographic bowel obstruction or free air. 2. Left basilar opacity and probable small left pleural effusion. Electronically signed by: Oneil Devonshire MD 12/11/2024 12:54 AM EST RP Workstation: [id number]   DG Chest Portable 1 View Result Date: 12/10/2024 EXAM: 1 VIEW(S) XRAY OF THE CHEST 12/10/2024 01:59:51 AM COMPARISON: Comparison with 03/22/2024 and CT 09/05/2024. CLINICAL HISTORY: tube placement FINDINGS: LINES, TUBES AND DEVICES: Left chest wall port with the tip in the mid SVC. Subdiaphragmatic enteric tube.  LUNGS AND PLEURA: Bibasilar atelectasis versus scarring. The lungs are otherwise clear. No pleural effusion. No pneumothorax. HEART AND MEDIASTINUM: No acute abnormality of the cardiac and mediastinal silhouettes. BONES AND SOFT TISSUES: No acute osseous abnormality. IMPRESSION: 1. Left chest wall port with the tip in the mid SVC and subdiaphragmatic enteric tube. 2. No acute findings. Electronically signed by: Norman Gatlin MD 12/10/2024 02:05 AM EST RP Workstation: [id number]   CT ABDOMEN PELVIS W CONTRAST Result Date: 12/10/2024 CLINICAL DATA:  Left lower quadrant pain EXAM: CT ABDOMEN AND PELVIS WITH CONTRAST TECHNIQUE: Multidetector CT imaging of the abdomen and pelvis was performed using the standard protocol following bolus administration of intravenous contrast. RADIATION DOSE REDUCTION: This exam was performed according to the departmental dose-optimization program which includes automated exposure control, adjustment of the mA and/or kV according to patient size and/or use of iterative reconstruction technique. CONTRAST:  OMNIPAQUE  IOHEXOL  300 MG/ML  SOLN COMPARISON:  PET CT 06/16/2024, CT abdomen pelvis 04/26/2022 FINDINGS: Lower chest: Lung bases demonstrate mild dependent atelectasis. Hepatobiliary: Numerous hepatic cysts and additional subcentimeter hypodensities too small to further characterize. No calcified gallstones or biliary dilatation. Pancreas: Unremarkable. No pancreatic ductal dilatation or surrounding inflammatory changes. Spleen: Normal in size without focal abnormality. Adrenals/Urinary Tract: Adrenal glands are normal. Kidneys show no hydronephrosis. Right renal cyst for which no imaging follow-up is recommended. The bladder is unremarkable. Stomach/Bowel: Prominent gastric distension with debris. Dilated duodenal bulb. Abnormal appearance of the  second and proximal third portion of duodenum. Abnormal mass or masslike process in the right retroperitoneum, appears associated  with the second and proximal third aspect of duodenum which appear abnormally thickened. Mass like area measures about 5.7 x 5.4 cm on series 3, image 39. It extends to the right renal hilar region on series 3, image 35 and appears to surround right renal hilar vessels. Vascular/Lymphatic: Aortic atherosclerosis. No aneurysm. No suspicious lymph nodes Reproductive: Markedly enlarged prostate with mass effect on the bladder Other: No ascites or free air. Musculoskeletal: Heterogenous sclerosis and lucency involving the pelvic bones is grossly stable compared with the prior exam and could reflect Paget's disease. IMPRESSION: 1. Abnormal mass or masslike process in the right retroperitoneum, this appears to involve the second and third portion of duodenum and results in upstream obstruction of the stomach and duodenal bulb. Surgical consultation is recommended. Mass like process extends to the hilar region of the right kidney and appears to encase hilar vessels. Pancreas abuts the masslike process but does not appear to be the origin of the mass. 2. Markedly enlarged prostate with mass effect on the bladder. 3. Heterogenous sclerosis and lucency involving the pelvic bones is grossly stable compared with the prior exam and could reflect Paget's disease. 4. Aortic atherosclerosis. Aortic Atherosclerosis (ICD10-I70.0). Electronically Signed   By: Luke Bun M.D.   On: 12/10/2024 00:17    Labs:  CBC: Recent Labs    12/09/24 2232 12/12/24 1151 12/19/24 0941 12/26/24 0829  WBC 6.3 4.1 2.3* 2.1*  HGB 11.7* 10.2* 9.6* 10.7*  HCT 34.7* 30.1* 28.1* 31.5*  PLT 145* 106* 57* 74*    COAGS: Recent Labs    12/26/24 0829  INR 1.2    BMP: Recent Labs    12/01/24 0944 12/09/24 2232 12/12/24 1151 12/19/24 0941  NA 138 138 139 136  K 3.9 3.8 3.5 3.4*  CL 106 104 106 104  CO2 21* 28 20* 20*  GLUCOSE 94 105* 101* 103*  BUN 25* 19 12 9   CALCIUM  8.2* 8.8* 8.0* 8.0*  CREATININE 1.23 1.09 1.03 0.85   GFRNONAA >60 >60 >60 >60    LIVER FUNCTION TESTS: Recent Labs    12/01/24 0944 12/09/24 2232 12/12/24 1151 12/19/24 0941  BILITOT 0.6 0.6 0.6 0.4  AST 23 20 23 17   ALT 35 32 27 18  ALKPHOS 37* 42 39 43  PROT 6.9 6.7 6.0* 5.8*  ALBUMIN 3.9 3.9 3.4* 3.5    TUMOR MARKERS: No results for input(s): AFPTM, CEA, CA199, CHROMGRNA in the last 8760 hours.  Assessment and Plan: Patient with past medical history of multiple myeloma presents with complaint of retroperitoneal mass.  IR consulted for biopsy at the request of Dr. Davonna. Case reviewed by Dr. Vanice who approves patient for procedure.  Patient presents today in their usual state of health.  He has been NPO and is not currently on blood thinners.   Risks and benefits of biopsy was discussed with the patient and/or patient's family including, but not limited to bleeding, infection, damage to adjacent structures or low yield requiring additional tests.  All of the questions were answered and there is agreement to proceed.  Consent signed and in chart.   Thank you for this interesting consult.  I greatly enjoyed meeting Alverda Marzetta Sauers and look forward to participating in their care.  A copy of this report was sent to the requesting provider on this date.  Electronically Signed: Suan Pyeatt Sue-Ellen Kanyah Matsushima, PA 12/26/2024, 9:28 AM  I spent a total of  30 Minutes   in face to face in clinical consultation, greater than 50% of which was counseling/coordinating care for retroperitoneal mass.   "

## 2024-12-26 NOTE — Sedation Documentation (Signed)
 Patient discharged with daughter, no sedation meds were given. PIV taken out.

## 2024-12-26 NOTE — Telephone Encounter (Addendum)
 Received after hours call from patient c/o diarrhea and decreased appetite.  Was scheduled to have biopsy today.  Attempted to call patient and was unable to reach him.  Reached out to Asberry Justino PEAK covering for [employer] to advise of symptoms.  Asberry spoke to patient's daughter, Paige who stated that he has a lack of appetite and is just feeling unwell.  Per Dr. Davonna, she will reach out to patient with plan and recommendations.

## 2024-12-26 NOTE — Procedures (Signed)
 Interventional Radiology Procedure:   Indications: Right retroperitoneal mass  Procedure: CT abdomen, cancelled CT biopsy  Findings: No safe percutaneous window for CT guided needle biopsy   Complications: None     EBL: Minimal  Plan:  Discharge to home.  Discuss with oncology and consider endoscopy and EUS biopsy.   Jenasis Straley R. Philip, MD  Pager: [phone]

## 2024-12-26 NOTE — Progress Notes (Signed)
 "   Impression/Assessment:   -Metastatic castrate sensitive prostate cancer with most recent PSA 6 months ago showing nadir level.  He has not had repeat blood test since that time.  He is only currently on androgen deprivation therapy with every 6 month Eligard  as well as denosumab   -BPH, minimal symptoms but he has a huge gland.  Off of medical therapy, emptying well  - Retroperitoneal/abdominal mass.  Unable to be biopsied.  I feel that this is unlikely to be related to his prostate cancer  Plan:  -We will check testosterone  and PSA levels today  - I had a long talk with him regarding his current process.  He filled me and that he will be having a T-cell transplant soon.  I did recommend increasing protein intake  - I will have him come back in 6 months for routine check with Dr. Sherrilee    History of Present Illness: Luis Stewart returns for f/u of BPH as well as PCa   Prior history of elevated PSA--s/p TRUS/Bx in [place] in 2005 and 2008. Apparently, all cores were negative but some were not indicative of prostate tissue at all.   Because of a high PCA 3 test in this office, he underwent TRUS/Bx on 6.23.2016. 1/12 cores were positive for adenocarcinoma-10% of the core in the right lateral base revealed Gleason 3+3 equal 6 adenocarcinoma. Prostate volume was 175 cc. PSA was 16.66. PSA density 0.09. He does not have significant lower urinary tract symptomatology.   He consulted with Dr. Cam in [place] to consider robotic prostatectomy. Due to his low volume prostate cancer, and very low PSA density, it was recommended that he continue with active surveillance.   1.30.2017: Surveillance TRUS/Bx , following a prostatic MRI which revealed no suspicious prostatic lesions and correlated the patient's large prostate. At that time, all 12 cores were negative for prostate cancer. Prostatic volume was 190 mL.   8.21.2019: fusion TRUS/Bx. Recent prostate MRI--volume 220 ml. No  significant lesions. No evidence transcapsular/perineural,SV/bony or lymphatic disease. Path--1 core (left apex) revealed GS 3+3 pattern in 5% of core.    PSA followup:   10.18.2016--17.16 11.14.2017--15.4 5.25.2018--17.9 4.29.2019--18.4 2.25.2020--15.9 3.30.2021--15.5 3.30.2022--22.4   9.24.2021 MRI prostate. Prostate volume 250 mL. No significant change compared to prior exam. No radiographic evidence of high-grade prostate carcinoma. PI-RADS 2: Low (clinically significant cancer is unlikely to be present)   4.5.2022: PSA 22.4.   10.4.2022:  PSA 34.5 this was drawn while the patient was being treated for an active COVID infection.  He was put on finasteride  after his last visit.  He was unable to tolerate it due to worsening of his lower urinary tract symptoms.   3.23.2023: PSMA PET scan-- 1. Multifocal radiotracer avid prostate cancer skeletal metastasis. Broad lytic lesion within the sacrum with associated radiotracer activity. Multiple discrete lesions in the thoracic lumbar spine and ribs with minimal CT change. 2. Markedly enlarged prostate gland. Focal activity inferior LEFT aspect the gland is suggestive of prostate adenocarcinoma. 3. No nodal metastasis in the abdomen pelvis. No visceral metastasis.    11.7.2023: Here for recheck.  He is on Firmagon  monthly for metastatic prostate cancer (confirmed by PSMA PET scan).  He is receiving regular infusions for multiple myeloma.  He is having hot flashes and low energy level which may be attributable to his androgen deprivation therapy.  PSA prior to initiation of ADT was around 27, most recently 2.1.  5.7.2024: Here for recheck.  Most recent PSA up slightly to 1.33.  Prior to that it was 0.88 just over a month ago.  He donated stem cells for a potential stem cell transplant last week.  He is having no bony pain.  He is on tamsulosin -lower urinary tract symptoms got worse after he was started on ADT last spring.  11.12.2024:  PSA 0.57.  He received a 63-month Eligard  dose on the 10th of last month with Dr. Rogers.  He is on Xgeva  administered for his metastatic prostate cancer as well as his myeloma.  No current bony pain.  He takes vitamin D  every other day.  Also on tamsulosin  every other day with adequate voiding symptoms-IPSS 8.  Last bone density study was in June 2023.  7.1.2025: PSA 0.26.   That is nadir level.  PET scan performed on 26 June.  Results revealed mild hypermetabolism corresponding to new right abdominal wall interstitial thickening.  1.6.2026: Recently found to have a mass in his retroperitoneum between his duodenum and his right kidney.  Unable to have this biopsied due to the location.  Not having any pain, but is losing weight.  Due to have a T-cell transplant at [facility] with his visit there starting next week.  No voiding issues. Past Medical History:  Diagnosis Date   Arthritis    Asbestosis (HCC)    Closed fracture of distal end of fibula with tibia with routine healing 05/28/2020   COVID-19 09/03/2021   Cyst of kidney, acquired    Cystic disease of liver    Enlarged prostate    Headache    History of fracture of leg    Right, childhood   Hx of migraine headaches    Hyperlipidemia    Multiple myeloma (HCC)    Neuropathy    PE (pulmonary thromboembolism) (HCC)    After knee surgery   Prostate cancer (HCC) 2015   Adenocarcinoma by biopsy    Past Surgical History:  Procedure Laterality Date   BIOPSY PROSTATE  2003 and 2005   COLONOSCOPY  11/28/2008   Dr. Rourk:internal hemorrhoids/tortus colon/ascending colon polyps, tubular adenomas   COLONOSCOPY N/A 09/04/2014   Procedure: COLONOSCOPY;  Surgeon: Lamar CHRISTELLA Hollingshead, MD;  [facility];  Service: Endoscopy;  Laterality: N/A;  9:30   COLONOSCOPY N/A 07/17/2017   Procedure: COLONOSCOPY;  Surgeon: Harvey Margo CROME, MD;  [facility];  Service: Endoscopy;  Laterality: N/A;   COLONOSCOPY WITH PROPOFOL  N/A  10/05/2023   Procedure: COLONOSCOPY WITH PROPOFOL ;  Surgeon: Hollingshead Lamar CHRISTELLA, MD;  [facility];  Service: Endoscopy;  Laterality: N/A;  10:00AM, ASA 3   ESOPHAGOGASTRODUODENOSCOPY N/A 07/16/2017   Procedure: ESOPHAGOGASTRODUODENOSCOPY (EGD);  Surgeon: Harvey Margo CROME, MD;  [facility];  Service: Endoscopy;  Laterality: N/A;   GIVENS CAPSULE STUDY  07/16/2017   Procedure: GIVENS CAPSULE STUDY;  Surgeon: Harvey Margo CROME, MD;  [facility];  Service: Endoscopy;;   JOINT REPLACEMENT N/A    Phreesia 01/12/2021   POLYPECTOMY  07/17/2017   Procedure: POLYPECTOMY;  Surgeon: Harvey Margo CROME, MD;  [facility];  Service: Endoscopy;;  cecal   POLYPECTOMY  10/05/2023   Procedure: POLYPECTOMY;  Surgeon: Hollingshead Lamar CHRISTELLA, MD;  [facility];  Service: Endoscopy;;   PORTACATH PLACEMENT Left 04/18/2022   Procedure: INSERTION PORT-A-CATH;  Surgeon: Mavis Anes, MD;  [facility]: [facility];  Service: General;  Laterality: Left;   TOTAL KNEE ARTHROPLASTY Right 06/10/2017   TOTAL KNEE ARTHROPLASTY Right 06/10/2017   Procedure: TOTAL KNEE  ARTHROPLASTY;  Surgeon: Beverley Toribio FALCON, MD;  Location: [facility] OR;  Service: Orthopedics;  Laterality: Right;    Home Medications:  Allergies as of 12/27/2024       Reactions   Sulfa Antibiotics Other (See Comments)   Unknown reaction. Childhood reaction        Medication List        Accurate as of December 26, 2024  2:49 PM. If you have any questions, ask your nurse or doctor.          acetaminophen  500 MG tablet Commonly known as: TYLENOL  Take 1,000 mg by mouth every 6 (six) hours as needed for mild pain or moderate pain.   acyclovir  400 MG tablet Commonly known as: ZOVIRAX  Take 1 tablet (400 mg total) by mouth 2 (two) times daily.   albuterol  108 (90 Base) MCG/ACT inhaler Commonly known as: VENTOLIN  HFA Inhale 2 puffs into the lungs every 6 (six) hours as needed for wheezing or shortness of breath.    dexamethasone  4 MG tablet Commonly known as: DECADRON  Take 20 mg 30 minutes prior to treatment What changed:  how much to take how to take this when to take this   DULoxetine  60 MG capsule Commonly known as: CYMBALTA  TAKE 1 CAPSULE BY MOUTH EVERY DAY   ferrous sulfate 325 (65 FE) MG EC tablet Take 325 mg by mouth daily.   lidocaine -prilocaine  cream Commonly known as: EMLA  Apply to affected area once   multivitamin with minerals Tabs tablet Take 1 tablet by mouth daily with breakfast. Centrum Silver for Men   nitroGLYCERIN  0.4 MG SL tablet Commonly known as: NITROSTAT  Place 1 tablet (0.4 mg total) under the tongue every 5 (five) minutes x 3 doses as needed for chest pain (If no relief after 3rd dose, call or go to ED.).   ondansetron  8 MG tablet Commonly known as: Zofran  Take 1 tablet (8 mg total) by mouth every 8 (eight) hours as needed for nausea or vomiting.   pantoprazole  40 MG tablet Commonly known as: PROTONIX  Take 1 tablet (40 mg total) by mouth daily.   pomalidomide  3 MG capsule Commonly known as: POMALYST  Take 1 capsule (3 mg total) by mouth daily. 21 days on, 7 days off.   potassium chloride  10 MEQ tablet Commonly known as: KLOR-CON  Take 1 tablet (10 mEq total) by mouth daily.   prochlorperazine  10 MG tablet Commonly known as: COMPAZINE  Take 1 tablet (10 mg total) by mouth every 6 (six) hours as needed for nausea or vomiting.   Restasis 0.05 % ophthalmic emulsion Generic drug: cycloSPORINE Place 1 drop into both eyes 2 (two) times daily.   rosuvastatin  5 MG tablet Commonly known as: CRESTOR  TAKE 1 TABLET (5 MG TOTAL) BY MOUTH DAILY.   Vitamin D  50 MCG (2000 UT) Caps Take 2,000 Units by mouth every other day.        Allergies:  Allergies  Allergen Reactions   Sulfa Antibiotics Other (See Comments)    Unknown reaction. Childhood reaction    Family History  Problem Relation Age of Onset   Diabetes Mother    Hypertension Mother    Obesity  Mother    Heart disease Mother    Mental illness Mother    Hypertension Sister    Obesity Sister    Arthritis Sister    Deep vein thrombosis Father    Dementia Father    Heart disease Father        CABG   Colon cancer Neg Hx  Social History:  reports that he has never smoked. He has never used smokeless tobacco. He reports that he does not drink alcohol and does not use drugs.  ROS: A complete review of systems was performed.  All systems are negative except for pertinent findings as noted.  Physical Exam:  Vital signs in last 24 hours: There were no vitals taken for this visit. Constitutional:  Alert and oriented, No acute distress.  He looks thin. Cardiovascular: Regular rate  Respiratory: Normal respiratory effort Neurologic: Grossly intact, no focal deficits Psychiatric: Normal mood and affect  I have reviewed prior pt notes  I have reviewed notes from referring/previous physicians--I ecology notes  I have independently reviewed imaging-PET scan from July as well as most recent CT scan.  I have reviewed prior PSA as well as pathology results  IPSS reviewed score 11/8, off of medical therapy  "

## 2024-12-27 ENCOUNTER — Encounter: Payer: Self-pay | Admitting: *Deleted

## 2024-12-27 ENCOUNTER — Ambulatory Visit: Admitting: Urology

## 2024-12-27 ENCOUNTER — Other Ambulatory Visit (HOSPITAL_BASED_OUTPATIENT_CLINIC_OR_DEPARTMENT_OTHER): Payer: Self-pay | Admitting: Oncology

## 2024-12-27 ENCOUNTER — Telehealth: Payer: Self-pay | Admitting: *Deleted

## 2024-12-27 VITALS — BP 118/74 | HR 105

## 2024-12-27 DIAGNOSIS — N401 Enlarged prostate with lower urinary tract symptoms: Secondary | ICD-10-CM | POA: Diagnosis not present

## 2024-12-27 DIAGNOSIS — R1909 Other intra-abdominal and pelvic swelling, mass and lump: Secondary | ICD-10-CM

## 2024-12-27 DIAGNOSIS — C61 Malignant neoplasm of prostate: Secondary | ICD-10-CM | POA: Diagnosis not present

## 2024-12-27 DIAGNOSIS — C9000 Multiple myeloma not having achieved remission: Secondary | ICD-10-CM

## 2024-12-27 DIAGNOSIS — R350 Frequency of micturition: Secondary | ICD-10-CM

## 2024-12-27 MED ORDER — ACYCLOVIR 400 MG PO TABS: 400.0000 mg | ORAL_TABLET | Freq: Two times a day (BID) | ORAL | 1 refills | Status: DC

## 2024-12-27 NOTE — Progress Notes (Signed)
 Oncology progress note  I called the patient and discussed his concerns.  Patient went for a mesenteric mass biopsy yesterday and could not do it because interventional radiology felt that it was not safe to do the procedure based on the location.  Recommended GI referral for EUS with biopsy.  We sent a referral to [facility] for the same as patient is going there for CAR-T treatment.  I will let Dr. Fernande know of this conversation and recommendations.  Patient also reports loss of appetite that is likely related to the mesenteric mass/inflammation.  Encourage patient to eat small frequent meals.  Patient stated that he has been trying to drink some protein shakes and eat as much as possible.  No more questions or concerns expressed at this time.  Mickiel Dry, MD Hematology/Oncology [facility]

## 2024-12-27 NOTE — Telephone Encounter (Signed)
 Received secure chat message on 12/27/2023 from Dr. Philip which states the following: Patient was scheduled for CT guided biopsy of right RP mass. We took images today but I could not find a safe percutaneous window for biopsy. I think we should ask GI ([facility] or [facility]) to consider endoscopy with EUS. Dr. Davonna aware. Referral made by scheduling to [facility] for EUS bx of right retroperitoneal mass.

## 2024-12-28 ENCOUNTER — Other Ambulatory Visit: Payer: Self-pay | Admitting: Oncology

## 2024-12-28 ENCOUNTER — Ambulatory Visit: Payer: Self-pay | Admitting: Urology

## 2024-12-28 ENCOUNTER — Other Ambulatory Visit: Payer: Self-pay

## 2024-12-28 LAB — TESTOSTERONE: Testosterone: 11 ng/dL — ABNORMAL LOW (ref 264–916)

## 2024-12-28 LAB — PSA: Prostate Specific Ag, Serum: 0.3 ng/mL (ref 0.0–4.0)

## 2025-01-03 ENCOUNTER — Inpatient Hospital Stay (HOSPITAL_COMMUNITY)
Admission: EM | Admit: 2025-01-03 | Discharge: 2025-01-06 | DRG: 380 | Disposition: A | Discharge status: Other Acute Inpatient Hospital | Attending: Internal Medicine | Admitting: Internal Medicine

## 2025-01-03 ENCOUNTER — Emergency Department (HOSPITAL_COMMUNITY)

## 2025-01-03 ENCOUNTER — Encounter: Payer: Self-pay | Admitting: Oncology

## 2025-01-03 ENCOUNTER — Encounter (HOSPITAL_COMMUNITY): Payer: Self-pay

## 2025-01-03 ENCOUNTER — Other Ambulatory Visit: Payer: Self-pay

## 2025-01-03 DIAGNOSIS — Z833 Family history of diabetes mellitus: Secondary | ICD-10-CM

## 2025-01-03 DIAGNOSIS — K311 Adult hypertrophic pyloric stenosis: Principal | ICD-10-CM | POA: Diagnosis present

## 2025-01-03 DIAGNOSIS — Z8616 Personal history of COVID-19: Secondary | ICD-10-CM

## 2025-01-03 DIAGNOSIS — Z820 Family history of epilepsy and other diseases of the nervous system: Secondary | ICD-10-CM

## 2025-01-03 DIAGNOSIS — C9000 Multiple myeloma not having achieved remission: Secondary | ICD-10-CM | POA: Diagnosis present

## 2025-01-03 DIAGNOSIS — Z832 Family history of diseases of the blood and blood-forming organs and certain disorders involving the immune mechanism: Secondary | ICD-10-CM

## 2025-01-03 DIAGNOSIS — Z96651 Presence of right artificial knee joint: Secondary | ICD-10-CM | POA: Diagnosis present

## 2025-01-03 DIAGNOSIS — N400 Enlarged prostate without lower urinary tract symptoms: Secondary | ICD-10-CM | POA: Diagnosis present

## 2025-01-03 DIAGNOSIS — Z8546 Personal history of malignant neoplasm of prostate: Secondary | ICD-10-CM

## 2025-01-03 DIAGNOSIS — N179 Acute kidney failure, unspecified: Secondary | ICD-10-CM | POA: Diagnosis present

## 2025-01-03 DIAGNOSIS — G629 Polyneuropathy, unspecified: Secondary | ICD-10-CM | POA: Diagnosis present

## 2025-01-03 DIAGNOSIS — D630 Anemia in neoplastic disease: Secondary | ICD-10-CM | POA: Diagnosis present

## 2025-01-03 DIAGNOSIS — Z79899 Other long term (current) drug therapy: Secondary | ICD-10-CM

## 2025-01-03 DIAGNOSIS — Z860100 Personal history of colon polyps, unspecified: Secondary | ICD-10-CM

## 2025-01-03 DIAGNOSIS — E860 Dehydration: Secondary | ICD-10-CM | POA: Diagnosis present

## 2025-01-03 DIAGNOSIS — E782 Mixed hyperlipidemia: Secondary | ICD-10-CM | POA: Diagnosis present

## 2025-01-03 DIAGNOSIS — Z882 Allergy status to sulfonamides status: Secondary | ICD-10-CM

## 2025-01-03 DIAGNOSIS — Z818 Family history of other mental and behavioral disorders: Secondary | ICD-10-CM

## 2025-01-03 DIAGNOSIS — Z8249 Family history of ischemic heart disease and other diseases of the circulatory system: Secondary | ICD-10-CM

## 2025-01-03 DIAGNOSIS — E43 Unspecified severe protein-calorie malnutrition: Secondary | ICD-10-CM | POA: Diagnosis present

## 2025-01-03 DIAGNOSIS — K219 Gastro-esophageal reflux disease without esophagitis: Secondary | ICD-10-CM | POA: Diagnosis present

## 2025-01-03 DIAGNOSIS — K315 Obstruction of duodenum: Secondary | ICD-10-CM | POA: Diagnosis present

## 2025-01-03 DIAGNOSIS — Z86711 Personal history of pulmonary embolism: Secondary | ICD-10-CM

## 2025-01-03 DIAGNOSIS — Z8261 Family history of arthritis: Secondary | ICD-10-CM

## 2025-01-03 LAB — COMPREHENSIVE METABOLIC PANEL WITH GFR
ALT: 20 U/L (ref 0–44)
AST: 15 U/L (ref 15–41)
Albumin: 3.6 g/dL (ref 3.5–5.0)
Alkaline Phosphatase: 64 U/L (ref 38–126)
Anion gap: 18 — ABNORMAL HIGH (ref 5–15)
BUN: 21 mg/dL (ref 8–23)
CO2: 22 mmol/L (ref 22–32)
Calcium: 9.2 mg/dL (ref 8.9–10.3)
Chloride: 99 mmol/L (ref 98–111)
Creatinine, Ser: 1.37 mg/dL — ABNORMAL HIGH (ref 0.61–1.24)
GFR, Estimated: 54 mL/min — ABNORMAL LOW
Glucose, Bld: 157 mg/dL — ABNORMAL HIGH (ref 70–99)
Potassium: 4.6 mmol/L (ref 3.5–5.1)
Sodium: 138 mmol/L (ref 135–145)
Total Bilirubin: 0.4 mg/dL (ref 0.0–1.2)
Total Protein: 6.7 g/dL (ref 6.5–8.1)

## 2025-01-03 LAB — CBC
HCT: 36 % — ABNORMAL LOW (ref 39.0–52.0)
Hemoglobin: 11.9 g/dL — ABNORMAL LOW (ref 13.0–17.0)
MCH: 31.8 pg (ref 26.0–34.0)
MCHC: 33.1 g/dL (ref 30.0–36.0)
MCV: 96.3 fL (ref 80.0–100.0)
Platelets: 229 K/uL (ref 150–400)
RBC: 3.74 MIL/uL — ABNORMAL LOW (ref 4.22–5.81)
RDW: 16 % — ABNORMAL HIGH (ref 11.5–15.5)
WBC: 7.6 K/uL (ref 4.0–10.5)
nRBC: 0 % (ref 0.0–0.2)

## 2025-01-03 LAB — LIPASE, BLOOD: Lipase: 15 U/L (ref 11–51)

## 2025-01-03 MED ORDER — SODIUM CHLORIDE 0.9 % IV BOLUS
1000.0000 mL | Freq: Once | INTRAVENOUS | Status: AC
  Administered 2025-01-03: 1000 mL via INTRAVENOUS

## 2025-01-03 MED ORDER — IOHEXOL 300 MG/ML  SOLN
100.0000 mL | Freq: Once | INTRAMUSCULAR | Status: AC | PRN
  Administered 2025-01-03: 100 mL via INTRAVENOUS

## 2025-01-03 MED ORDER — ONDANSETRON HCL 4 MG/2ML IJ SOLN
4.0000 mg | Freq: Once | INTRAMUSCULAR | Status: AC
  Administered 2025-01-03: 4 mg via INTRAVENOUS
  Filled 2025-01-03: qty 2

## 2025-01-03 NOTE — ED Triage Notes (Signed)
 Pt c/o nausea and abdominal pain since Saturday and emesis began x1 day ago. Pt states he is not able to keep anything down and no appetite.

## 2025-01-04 ENCOUNTER — Encounter (HOSPITAL_COMMUNITY): Payer: Self-pay | Admitting: Internal Medicine

## 2025-01-04 ENCOUNTER — Inpatient Hospital Stay (HOSPITAL_COMMUNITY): Admitting: Anesthesiology

## 2025-01-04 ENCOUNTER — Encounter (HOSPITAL_COMMUNITY)
Admission: EM | Disposition: A | Discharge status: Other Acute Inpatient Hospital | Payer: Self-pay | Source: Home / Self Care | Attending: Internal Medicine

## 2025-01-04 ENCOUNTER — Other Ambulatory Visit: Payer: Self-pay

## 2025-01-04 DIAGNOSIS — K311 Adult hypertrophic pyloric stenosis: Secondary | ICD-10-CM | POA: Diagnosis present

## 2025-01-04 DIAGNOSIS — N179 Acute kidney failure, unspecified: Secondary | ICD-10-CM

## 2025-01-04 DIAGNOSIS — G629 Polyneuropathy, unspecified: Secondary | ICD-10-CM | POA: Diagnosis present

## 2025-01-04 DIAGNOSIS — Z818 Family history of other mental and behavioral disorders: Secondary | ICD-10-CM | POA: Diagnosis not present

## 2025-01-04 DIAGNOSIS — E782 Mixed hyperlipidemia: Secondary | ICD-10-CM

## 2025-01-04 DIAGNOSIS — K315 Obstruction of duodenum: Secondary | ICD-10-CM | POA: Diagnosis present

## 2025-01-04 DIAGNOSIS — Z8249 Family history of ischemic heart disease and other diseases of the circulatory system: Secondary | ICD-10-CM | POA: Diagnosis not present

## 2025-01-04 DIAGNOSIS — Z820 Family history of epilepsy and other diseases of the nervous system: Secondary | ICD-10-CM | POA: Diagnosis not present

## 2025-01-04 DIAGNOSIS — Z832 Family history of diseases of the blood and blood-forming organs and certain disorders involving the immune mechanism: Secondary | ICD-10-CM | POA: Diagnosis not present

## 2025-01-04 DIAGNOSIS — Z79899 Other long term (current) drug therapy: Secondary | ICD-10-CM | POA: Diagnosis not present

## 2025-01-04 DIAGNOSIS — K219 Gastro-esophageal reflux disease without esophagitis: Secondary | ICD-10-CM | POA: Diagnosis present

## 2025-01-04 DIAGNOSIS — C9000 Multiple myeloma not having achieved remission: Secondary | ICD-10-CM | POA: Diagnosis present

## 2025-01-04 DIAGNOSIS — Z8616 Personal history of COVID-19: Secondary | ICD-10-CM | POA: Diagnosis not present

## 2025-01-04 DIAGNOSIS — E860 Dehydration: Secondary | ICD-10-CM | POA: Diagnosis present

## 2025-01-04 DIAGNOSIS — Z833 Family history of diabetes mellitus: Secondary | ICD-10-CM | POA: Diagnosis not present

## 2025-01-04 DIAGNOSIS — Z8546 Personal history of malignant neoplasm of prostate: Secondary | ICD-10-CM | POA: Diagnosis not present

## 2025-01-04 DIAGNOSIS — Z96651 Presence of right artificial knee joint: Secondary | ICD-10-CM | POA: Diagnosis present

## 2025-01-04 DIAGNOSIS — Z8261 Family history of arthritis: Secondary | ICD-10-CM | POA: Diagnosis not present

## 2025-01-04 DIAGNOSIS — R112 Nausea with vomiting, unspecified: Secondary | ICD-10-CM | POA: Diagnosis not present

## 2025-01-04 DIAGNOSIS — R1011 Right upper quadrant pain: Secondary | ICD-10-CM | POA: Diagnosis not present

## 2025-01-04 DIAGNOSIS — Z86711 Personal history of pulmonary embolism: Secondary | ICD-10-CM | POA: Diagnosis not present

## 2025-01-04 DIAGNOSIS — D630 Anemia in neoplastic disease: Secondary | ICD-10-CM | POA: Diagnosis present

## 2025-01-04 DIAGNOSIS — N400 Enlarged prostate without lower urinary tract symptoms: Secondary | ICD-10-CM | POA: Diagnosis present

## 2025-01-04 DIAGNOSIS — Z860100 Personal history of colon polyps, unspecified: Secondary | ICD-10-CM | POA: Diagnosis not present

## 2025-01-04 DIAGNOSIS — E43 Unspecified severe protein-calorie malnutrition: Secondary | ICD-10-CM | POA: Diagnosis present

## 2025-01-04 DIAGNOSIS — Z882 Allergy status to sulfonamides status: Secondary | ICD-10-CM | POA: Diagnosis not present

## 2025-01-04 DIAGNOSIS — Z7401 Bed confinement status: Secondary | ICD-10-CM | POA: Diagnosis not present

## 2025-01-04 HISTORY — PX: ESOPHAGOGASTRODUODENOSCOPY: SHX5428

## 2025-01-04 LAB — URINALYSIS, ROUTINE W REFLEX MICROSCOPIC
Bilirubin Urine: NEGATIVE
Glucose, UA: NEGATIVE mg/dL
Hgb urine dipstick: NEGATIVE
Ketones, ur: 5 mg/dL — AB
Leukocytes,Ua: NEGATIVE
Nitrite: NEGATIVE
Protein, ur: NEGATIVE mg/dL
Specific Gravity, Urine: 1.044 — ABNORMAL HIGH (ref 1.005–1.030)
pH: 5 (ref 5.0–8.0)

## 2025-01-04 LAB — CBC
HCT: 33.9 % — ABNORMAL LOW (ref 39.0–52.0)
Hemoglobin: 11.4 g/dL — ABNORMAL LOW (ref 13.0–17.0)
MCH: 31.9 pg (ref 26.0–34.0)
MCHC: 33.6 g/dL (ref 30.0–36.0)
MCV: 95 fL (ref 80.0–100.0)
Platelets: 175 K/uL (ref 150–400)
RBC: 3.57 MIL/uL — ABNORMAL LOW (ref 4.22–5.81)
RDW: 16.2 % — ABNORMAL HIGH (ref 11.5–15.5)
WBC: 7.2 K/uL (ref 4.0–10.5)
nRBC: 0 % (ref 0.0–0.2)

## 2025-01-04 LAB — COMPREHENSIVE METABOLIC PANEL WITH GFR
ALT: 18 U/L (ref 0–44)
AST: 13 U/L — ABNORMAL LOW (ref 15–41)
Albumin: 3.2 g/dL — ABNORMAL LOW (ref 3.5–5.0)
Alkaline Phosphatase: 58 U/L (ref 38–126)
Anion gap: 13 (ref 5–15)
BUN: 21 mg/dL (ref 8–23)
CO2: 25 mmol/L (ref 22–32)
Calcium: 8.4 mg/dL — ABNORMAL LOW (ref 8.9–10.3)
Chloride: 102 mmol/L (ref 98–111)
Creatinine, Ser: 1.19 mg/dL (ref 0.61–1.24)
GFR, Estimated: >60 mL/min
Glucose, Bld: 118 mg/dL — ABNORMAL HIGH (ref 70–99)
Potassium: 5.1 mmol/L (ref 3.5–5.1)
Sodium: 140 mmol/L (ref 135–145)
Total Bilirubin: 0.3 mg/dL (ref 0.0–1.2)
Total Protein: 5.9 g/dL — ABNORMAL LOW (ref 6.5–8.1)

## 2025-01-04 LAB — MAGNESIUM: Magnesium: 2.3 mg/dL (ref 1.7–2.4)

## 2025-01-04 LAB — PHOSPHORUS: Phosphorus: 3.4 mg/dL (ref 2.5–4.6)

## 2025-01-04 MED ORDER — ONDANSETRON HCL 4 MG/2ML IJ SOLN
INTRAMUSCULAR | Status: DC | PRN
  Administered 2025-01-04: 4 mg via INTRAVENOUS

## 2025-01-04 MED ORDER — ACETAMINOPHEN 325 MG PO TABS: 650.0000 mg | ORAL_TABLET | Freq: Four times a day (QID) | ORAL | Status: DC | PRN

## 2025-01-04 MED ORDER — ACETAMINOPHEN 650 MG RE SUPP: 650.0000 mg | Freq: Four times a day (QID) | RECTAL | Status: DC | PRN

## 2025-01-04 MED ORDER — PROPOFOL 10 MG/ML IV BOLUS
INTRAVENOUS | Status: DC | PRN
  Administered 2025-01-04: 160 mg via INTRAVENOUS
  Administered 2025-01-04: 40 mg via INTRAVENOUS

## 2025-01-04 MED ORDER — LACTATED RINGERS IV SOLN: INTRAVENOUS | Status: DC

## 2025-01-04 MED ORDER — SUCCINYLCHOLINE CHLORIDE 200 MG/10ML IV SOSY
PREFILLED_SYRINGE | INTRAVENOUS | Status: AC
  Filled 2025-01-04: qty 10

## 2025-01-04 MED ORDER — LIDOCAINE 2% (20 MG/ML) 5 ML SYRINGE
INTRAMUSCULAR | Status: DC | PRN
  Administered 2025-01-04: 60 mg via INTRAVENOUS

## 2025-01-04 MED ORDER — PROCHLORPERAZINE EDISYLATE 10 MG/2ML IJ SOLN
10.0000 mg | Freq: Four times a day (QID) | INTRAMUSCULAR | Status: DC | PRN
  Administered 2025-01-04 – 2025-01-06 (×4): 10 mg via INTRAVENOUS
  Filled 2025-01-04 (×6): qty 2

## 2025-01-04 MED ORDER — ONDANSETRON HCL 4 MG/2ML IJ SOLN
4.0000 mg | Freq: Four times a day (QID) | INTRAMUSCULAR | Status: DC | PRN
  Administered 2025-01-04 – 2025-01-05 (×4): 4 mg via INTRAVENOUS
  Filled 2025-01-04 (×4): qty 2

## 2025-01-04 MED ORDER — PANTOPRAZOLE SODIUM 40 MG IV SOLR
40.0000 mg | INTRAVENOUS | Status: DC
  Administered 2025-01-04 – 2025-01-06 (×3): 40 mg via INTRAVENOUS
  Filled 2025-01-04 (×4): qty 10

## 2025-01-04 MED ORDER — ROSUVASTATIN CALCIUM 10 MG PO TABS
5.0000 mg | ORAL_TABLET | Freq: Every day | ORAL | Status: DC
  Administered 2025-01-04 – 2025-01-06 (×3): 5 mg via ORAL
  Filled 2025-01-04 (×3): qty 1

## 2025-01-04 MED ORDER — BOOST / RESOURCE BREEZE PO LIQD CUSTOM
1.0000 | Freq: Three times a day (TID) | ORAL | Status: DC
  Administered 2025-01-05 – 2025-01-06 (×2): 1 via ORAL

## 2025-01-04 MED ORDER — PHENYLEPHRINE 80 MCG/ML (10ML) SYRINGE FOR IV PUSH (FOR BLOOD PRESSURE SUPPORT)
PREFILLED_SYRINGE | INTRAVENOUS | Status: DC | PRN
  Administered 2025-01-04: 160 ug via INTRAVENOUS
  Administered 2025-01-04: 80 ug via INTRAVENOUS
  Administered 2025-01-04: 160 ug via INTRAVENOUS

## 2025-01-04 MED ORDER — SUCCINYLCHOLINE CHLORIDE 200 MG/10ML IV SOSY
PREFILLED_SYRINGE | INTRAVENOUS | Status: DC | PRN
  Administered 2025-01-04: 160 mg via INTRAVENOUS

## 2025-01-04 MED ORDER — PHENYLEPHRINE 80 MCG/ML (10ML) SYRINGE FOR IV PUSH (FOR BLOOD PRESSURE SUPPORT)
PREFILLED_SYRINGE | INTRAVENOUS | Status: AC
  Filled 2025-01-04: qty 10

## 2025-01-04 MED ORDER — ONDANSETRON HCL 4 MG/2ML IJ SOLN
INTRAMUSCULAR | Status: AC
  Filled 2025-01-04: qty 2

## 2025-01-04 MED ORDER — ONDANSETRON HCL 4 MG PO TABS
4.0000 mg | ORAL_TABLET | Freq: Four times a day (QID) | ORAL | Status: DC | PRN
  Administered 2025-01-05: 4 mg via ORAL
  Filled 2025-01-04: qty 1

## 2025-01-04 MED ORDER — LACTATED RINGERS IV SOLN: INTRAVENOUS | Status: AC

## 2025-01-04 MED ORDER — SODIUM CHLORIDE 0.9 % IV SOLN: INTRAVENOUS | Status: DC

## 2025-01-04 NOTE — H&P (Signed)
 " History and Physical    Patient: Luis Stewart DOB: [birth year] DOA: 01/03/2025 DOS: the patient was seen and examined on 01/04/2025 PCP: [name], MD  Patient coming from: Home  Chief Complaint: No chief complaint on file.  HPI: Luis Stewart is a 74 y.o. male with medical history significant of multiple myeloma, hyperlipidemia, prostate cancer, history of hepatic and renal cysts, GERD, hyperlipidemia who presents to the emergency department due to nausea and vomiting which started on Sunday (1/11), this was associated with upper abdominal discomfort, decreased appetite with last good meal being on Saturday (1/10). He was recently admitted here from 12/09/2024 through 12/11/2024 due to retroperitoneal mass.  Oncology (Dr. Davonna) was consulted and referred patient to interventional radiology.  Unfortunately, IR felt that it was not safe to do the procedure based on the location and patient was referred to GI for EUS with biopsy at [facility], since patient is going there for CAR-T.  ED course In the emergency department, patient was tachycardic and other vital signs were within normal range.  Workup in the ED showed normocytic anemia and normal BMP except for blood glucose of 147 and creatinine of 1.37 CT abdomen and pelvis with contrast showed mechanical gastric proximal duodenal obstruction at the junction of the 2nd and 3rd portions of the duodenum. Zofran  was given and IV hydration was provided.  [facility] was asked to admit patient    Review of Systems: As mentioned in the history of present illness. All other systems reviewed and are negative. Past Medical History:  Diagnosis Date   Arthritis    Asbestosis (HCC)    Closed fracture of distal end of fibula with tibia with routine healing 05/28/2020   COVID-19 09/03/2021   Cyst of kidney, acquired    Cystic disease of liver    Enlarged prostate    Headache    History of fracture of leg     Right, childhood   Hx of migraine headaches    Hyperlipidemia    Multiple myeloma (HCC)    Neuropathy    PE (pulmonary thromboembolism) (HCC)    After knee surgery   Prostate cancer (HCC) 2015   Adenocarcinoma by biopsy   Past Surgical History:  Procedure Laterality Date   BIOPSY PROSTATE  2003 and 2005   COLONOSCOPY  11/28/2008   Dr. Rourk:internal hemorrhoids/tortus colon/ascending colon polyps, tubular adenomas   COLONOSCOPY N/A 09/04/2014   Procedure: COLONOSCOPY;  Surgeon: Lamar CHRISTELLA Hollingshead, MD;  [facility];  Service: Endoscopy;  Laterality: N/A;  9:30   COLONOSCOPY N/A 07/17/2017   Procedure: COLONOSCOPY;  Surgeon: Harvey Margo CROME, MD;  [facility];  Service: Endoscopy;  Laterality: N/A;   COLONOSCOPY WITH PROPOFOL  N/A 10/05/2023   Procedure: COLONOSCOPY WITH PROPOFOL ;  Surgeon: Hollingshead Lamar CHRISTELLA, MD;  [facility];  Service: Endoscopy;  Laterality: N/A;  10:00AM, ASA 3   ESOPHAGOGASTRODUODENOSCOPY N/A 07/16/2017   Procedure: ESOPHAGOGASTRODUODENOSCOPY (EGD);  Surgeon: Harvey Margo CROME, MD;  [facility];  Service: Endoscopy;  Laterality: N/A;   GIVENS CAPSULE STUDY  07/16/2017   Procedure: GIVENS CAPSULE STUDY;  Surgeon: Harvey Margo CROME, MD;  [facility];  Service: Endoscopy;;   JOINT REPLACEMENT N/A    Phreesia 01/12/2021   POLYPECTOMY  07/17/2017   Procedure: POLYPECTOMY;  Surgeon: Harvey Margo CROME, MD;  [facility];  Service: Endoscopy;;  cecal   POLYPECTOMY  10/05/2023   Procedure: POLYPECTOMY;  Surgeon: Hollingshead,  [name], MD;  [facility];  Service: Endoscopy;;   PORTACATH PLACEMENT Left 04/18/2022   Procedure: INSERTION PORT-A-CATH;  Surgeon: Mavis Anes, MD;  Location: [facility];  Service: General;  Laterality: Left;   TOTAL KNEE ARTHROPLASTY Right 06/10/2017   TOTAL KNEE ARTHROPLASTY Right 06/10/2017   Procedure: TOTAL KNEE ARTHROPLASTY;  Surgeon: Beverley Toribio FALCON, MD;  Location: [facility] OR;  Service:  Orthopedics;  Laterality: Right;   Social History:  reports that he has never smoked. He has never used smokeless tobacco. He reports that he does not drink alcohol and does not use drugs.  Allergies[1]  Family History  Problem Relation Age of Onset   Diabetes Mother    Hypertension Mother    Obesity Mother    Heart disease Mother    Mental illness Mother    Hypertension Sister    Obesity Sister    Arthritis Sister    Deep vein thrombosis Father    Dementia Father    Heart disease Father        CABG   Colon cancer Neg Hx     Prior to Admission medications  Medication Sig Start Date End Date Taking? Authorizing Provider  acetaminophen  (TYLENOL ) 500 MG tablet Take 1,000 mg by mouth every 6 (six) hours as needed for mild pain or moderate pain.    [provider]  acyclovir  (ZOVIRAX ) 400 MG tablet Take 1 tablet (400 mg total) by mouth 2 (two) times daily. 12/27/24   Davonna Siad, MD  albuterol  (VENTOLIN  HFA) 108 (90 Base) MCG/ACT inhaler Inhale 2 puffs into the lungs every 6 (six) hours as needed for wheezing or shortness of breath. 09/06/24   [name], MD  Cholecalciferol (VITAMIN D ) 2000 units CAPS Take 2,000 Units by mouth every other day.    [provider]  dexamethasone  (DECADRON ) 4 MG tablet Take 20 mg 30 minutes prior to treatment Patient taking differently: Take 20 mg by mouth See admin instructions. Take 20 mg 30 minutes prior to treatment 11/08/24   Davonna Siad, MD  DULoxetine  (CYMBALTA ) 60 MG capsule TAKE 1 CAPSULE BY MOUTH EVERY DAY 11/29/24   Patel, Rutwik K, MD  ferrous sulfate 325 (65 FE) MG EC tablet Take 325 mg by mouth daily.    [provider]  lidocaine -prilocaine  (EMLA ) cream Apply to affected area once 12/05/24   Kandala, Hyndavi, MD  Multiple Vitamin (MULTIVITAMIN WITH MINERALS) TABS tablet Take 1 tablet by mouth daily with breakfast. Centrum Silver for Men    [provider]  nitroGLYCERIN  (NITROSTAT ) 0.4 MG SL  tablet Place 1 tablet (0.4 mg total) under the tongue every 5 (five) minutes x 3 doses as needed for chest pain (If no relief after 3rd dose, call or go to ED.). 11/20/22   Miriam Norris, NP  ondansetron  (ZOFRAN ) 8 MG tablet Take 1 tablet (8 mg total) by mouth every 8 (eight) hours as needed for nausea or vomiting. 12/05/24   Davonna Siad, MD  pantoprazole  (PROTONIX ) 40 MG tablet Take 1 tablet (40 mg total) by mouth daily. 12/11/24   Ricky Fines, MD  pomalidomide  (POMALYST ) 3 MG capsule Take 1 capsule (3 mg total) by mouth daily. 21 days on, 7 days off. 12/02/24   Davonna Siad, MD  potassium chloride  (KLOR-CON ) 10 MEQ tablet Take 1 tablet (10 mEq total) by mouth daily. 09/22/24 12/27/24  Kandala, Hyndavi, MD  prochlorperazine  (COMPAZINE ) 10 MG tablet Take 1 tablet (10 mg total) by mouth every 6 (six) hours as  needed for nausea or vomiting. 12/05/24   Davonna Siad, MD  RESTASIS  0.05 % ophthalmic emulsion Place 1 drop into both eyes 2 (two) times daily. 10/18/23   [provider]  rosuvastatin  (CRESTOR ) 5 MG tablet TAKE 1 TABLET (5 MG TOTAL) BY MOUTH DAILY. 03/30/24 10/05/25  [name], MD    Physical Exam: Vitals:   01/03/25 2339 01/03/25 2345 01/04/25 0000 01/04/25 0015  BP: (!) 143/80 137/78 129/65 123/66  Pulse: 91 91 87 89  Resp: 19 15 (!) 25 (!) 22  Temp:      TempSrc:      SpO2: 98% 97% 97% 98%   General: Awake and alert and oriented x3. Not in any acute distress.  HEENT: NCAT.  PERRLA. EOMI. Sclerae anicteric.  Dry mucosal membranes. Neck: Neck supple without lymphadenopathy. No carotid bruits. No masses palpated.  Cardiovascular: Regular rate with normal S1-S2 sounds. No murmurs, rubs or gallops auscultated. No JVD.  Respiratory: Clear breath sounds.  No accessory muscle use. Abdomen: Soft, tender to palpation of epigastrium without guarding, nondistended.  Skin: No rashes, lesions, or ulcerations.  Dry, warm to touch. Musculoskeletal:  2+ dorsalis  pedis and radial pulses. Good ROM.  No contractures  Psychiatric: Intact judgment and insight.  Mood appropriate to current condition. Neurologic: No focal neurological deficits. Strength is 5/5 x 4.  CN II - XII grossly intact.  Assessment and Plan: Gastric outlet obstruction This was due to patient's history of retroperitoneal mass.  Patient was referred to GI at [facility] for EUS with biopsy.  Gastroenterology will be consult Consider NG tube if patient continues to have recurrent vomiting Continue NPO at this time with plan to advanced diet as tolerated Continue IV hydration Continue IV Protonix  40 mg daily Continue Zofran  p.r.n. for nausea/vomiting  Nausea and vomiting Continue Zofran  as needed  Acute kidney injury Creatinine 1.37 (baseline creatinine 0.9-1.1) Continue IV hydration Renally adjust medications, avoid nephrotoxic agents/dehydration/hypotension  Mixed hyperlipidemia Continue statin  GERD Continue Protonix   Multiple myeloma Prostate cancer Patient will continue outpatient follow-up with oncology    Advance Care Planning: Full code  Consults: Gastroenterology  Family Communication: None at bedside  Severity of Illness: The appropriate patient status for this patient is INPATIENT. Inpatient status is judged to be reasonable and necessary in order to provide the required intensity of service to ensure the patient's safety. The patient's presenting symptoms, physical exam findings, and initial radiographic and laboratory data in the context of their chronic comorbidities is felt to place them at high risk for further clinical deterioration. Furthermore, it is not anticipated that the patient will be medically stable for discharge from the hospital within 2 midnights of admission.   * I certify that at the point of admission it is my clinical judgment that the patient will require inpatient hospital care spanning beyond 2 midnights from the point of admission  due to high intensity of service, high risk for further deterioration and high frequency of surveillance required.*  Author: Elpidia Karn, DO 01/04/2025 12:43 AM  For on call review [email or web address].      [1]  Allergies Allergen Reactions   Sulfa Antibiotics Other (See Comments)    Unknown reaction. Childhood reaction   "

## 2025-01-04 NOTE — Plan of Care (Signed)
  Problem: Education: Goal: Knowledge of General Education information will improve Description: Including pain rating scale, medication(s)/side effects and non-pharmacologic comfort measures Outcome: Progressing   Problem: Clinical Measurements: Goal: Ability to maintain clinical measurements within normal limits will improve Outcome: Progressing Goal: Will remain free from infection Outcome: Progressing Goal: Diagnostic test results will improve Outcome: Progressing Goal: Respiratory complications will improve Outcome: Progressing Goal: Cardiovascular complication will be avoided Outcome: Progressing   Problem: Pain Managment: Goal: General experience of comfort will improve and/or be controlled Outcome: Progressing   Problem: Safety: Goal: Ability to remain free from injury will improve Outcome: Progressing

## 2025-01-04 NOTE — Anesthesia Preprocedure Evaluation (Addendum)
"                                    Anesthesia Evaluation  Patient identified by MRN, date of birth, ID band Patient awake    Reviewed: Allergy & Precautions, H&P , NPO status , Patient's Chart, lab work & pertinent test results, reviewed documented beta blocker date and time   Airway Mallampati: II  TM Distance: >3 FB Neck ROM: full    Dental no notable dental hx.    Pulmonary neg pulmonary ROS   Pulmonary exam normal breath sounds clear to auscultation       Cardiovascular Exercise Tolerance: Good hypertension, negative cardio ROS  Rhythm:regular Rate:Normal     Neuro/Psych  Headaches  Neuromuscular disease negative neurological ROS  negative psych ROS   GI/Hepatic negative GI ROS, Neg liver ROS,GERD  ,,  Endo/Other  negative endocrine ROS    Renal/GU Renal diseasenegative Renal ROS  negative genitourinary   Musculoskeletal   Abdominal   Peds  Hematology negative hematology ROS (+) Blood dyscrasia, anemia   Anesthesia Other Findings   Reproductive/Obstetrics negative OB ROS                              Anesthesia Physical Anesthesia Plan  ASA: 3 and emergent  Anesthesia Plan: General ETT and General   Post-op Pain Management:    Induction:   PONV Risk Score and Plan: Ondansetron   Airway Management Planned:   Additional Equipment:   Intra-op Plan:   Post-operative Plan:   Informed Consent: I have reviewed the patients History and Physical, chart, labs and discussed the procedure including the risks, benefits and alternatives for the proposed anesthesia with the patient or authorized representative who has indicated his/her understanding and acceptance.     Dental Advisory Given  Plan Discussed with: CRNA  Anesthesia Plan Comments:          Anesthesia Quick Evaluation  "

## 2025-01-04 NOTE — Progress Notes (Signed)
 Transition of Care Department [facility]) has reviewed patient and no other TOC needs have been identified at this time. We will continue to monitor patient advancement through interdisciplinary progression rounds. If new patient transition needs arise, please place a TOC consult.     01/04/25 0749  TOC Brief Assessment  Insurance and Status Reviewed  Patient has primary care physician Yes  Home environment has been reviewed Lives with wife.  Prior level of function: Independent.  Prior/Current Home Services No current home services  Social Drivers of Health Review SDOH reviewed no interventions necessary  Readmission risk has been reviewed Yes  Transition of care needs no transition of care needs at this time

## 2025-01-04 NOTE — ED Provider Notes (Signed)
 " [facility] EMERGENCY DEPARTMENT AT [facility] Provider Note  CSN: [id number] Arrival date & time: 01/03/25 8082  Chief Complaint(s) No chief complaint on file.  HPI Luis Stewart is a 74 y.o. male history of multiple myeloma, prostate cancer, recent admission for SBO due to retroperitoneal mass surrounding the duodenum presenting for nausea, vomiting.  Patient reports symptoms began on Sunday, last ate well on Saturday.  Has had progressive nausea, vomiting, upper abdominal discomfort.  Reports decreased appetite.  No fevers or chills.  No diarrhea.  Reports some chronic sore throat, runny nose.   Past Medical History Past Medical History:  Diagnosis Date   Arthritis    Asbestosis (HCC)    Closed fracture of distal end of fibula with tibia with routine healing 05/28/2020   COVID-19 09/03/2021   Cyst of kidney, acquired    Cystic disease of liver    Enlarged prostate    Headache    History of fracture of leg    Right, childhood   Hx of migraine headaches    Hyperlipidemia    Multiple myeloma (HCC)    Neuropathy    PE (pulmonary thromboembolism) (HCC)    After knee surgery   Prostate cancer (HCC) 2015   Adenocarcinoma by biopsy   Patient Active Problem List   Diagnosis Date Noted   Retroperitoneal mass 12/10/2024   Acute bacterial bronchitis 09/06/2024   Iron deficiency anemia 08/25/2024   Encounter for general adult medical examination with abnormal findings 02/19/2024   PSVT (paroxysmal supraventricular tachycardia) 02/19/2024   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   Change in bowel function 08/11/2023   Prediabetes 02/18/2023   Idiopathic peripheral neuropathy 02/18/2023   Autologous donor of stem cells 11/25/2022   H/O urinary retention 04/30/2022   Benign prostatic hyperplasia with urinary frequency 04/30/2022   Multiple myeloma (HCC) 04/09/2022   Hyperproteinemia 02/12/2022   Lumbar radiculopathy  02/28/2021   Closed fracture of left distal fibula 04/18/2020   ED (erectile dysfunction) 05/11/2019   Atypical chest pain 10/25/2018   History of DVT (deep vein thrombosis) 04/16/2018   GERD (gastroesophageal reflux disease) 10/15/2017   Pulmonary nodule 07/19/2017   Constipation 07/15/2017   History of pulmonary embolism 06/18/2017   Primary localized osteoarthritis of right knee 06/10/2017   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 Authorizing Provider  acetaminophen  (TYLENOL ) 500 MG tablet Take 1,000 mg by mouth every 6 (six) hours as needed for mild pain or moderate pain.    [provider]  acyclovir  (ZOVIRAX ) 400 MG tablet Take 1 tablet (400 mg total) by mouth 2 (two) times daily. 12/27/24   Davonna Siad, MD  albuterol  (VENTOLIN  HFA) 108 (90 Base) MCG/ACT inhaler Inhale 2 puffs into the lungs every 6 (six) hours as needed for wheezing or shortness of breath. 09/06/24   [name], MD  Cholecalciferol (VITAMIN D ) 2000 units CAPS Take 2,000 Units by mouth every other day.    [provider]  dexamethasone  (DECADRON ) 4 MG tablet Take 20 mg 30 minutes prior to treatment Patient taking differently: Take 20 mg by mouth See admin instructions. Take 20 mg 30 minutes prior to treatment 11/08/24   Davonna Siad, MD  DULoxetine  (CYMBALTA ) 60 MG capsule TAKE 1 CAPSULE BY MOUTH EVERY DAY 11/29/24   Patel, Rutwik K, MD  ferrous sulfate 325 (65 FE) MG EC tablet Take 325 mg by mouth daily.    [provider]  lidocaine -prilocaine  (EMLA ) cream Apply to affected area once 12/05/24   Kandala, Hyndavi, MD  Multiple Vitamin (MULTIVITAMIN WITH MINERALS) TABS tablet  Take 1 tablet by mouth daily with breakfast. Centrum Silver for Men    [provider]  nitroGLYCERIN  (NITROSTAT ) 0.4 MG SL tablet Place 1 tablet (0.4 mg total) under the tongue every 5 (five) minutes x 3 doses as needed for chest pain (If no relief after 3rd dose, call or go to ED.). 11/20/22   Miriam Norris, NP  ondansetron  (ZOFRAN ) 8 MG tablet Take 1 tablet (8 mg total) by mouth every 8 (eight) hours as needed for nausea or vomiting. 12/05/24   Davonna Siad, MD  pantoprazole  (PROTONIX ) 40 MG tablet Take 1 tablet (40 mg total) by mouth daily. 12/11/24   Ricky Fines, MD  pomalidomide  (POMALYST ) 3 MG capsule Take 1 capsule (3 mg total) by mouth daily. 21 days on, 7 days off. 12/02/24   Davonna Siad, MD  potassium chloride  (KLOR-CON ) 10 MEQ tablet Take 1 tablet (10 mEq total) by mouth daily. 09/22/24 12/27/24  Kandala, Hyndavi, MD  prochlorperazine  (COMPAZINE ) 10 MG tablet Take 1 tablet (10 mg total) by mouth every 6 (six) hours as needed for nausea or vomiting. 12/05/24   Davonna Siad, MD  RESTASIS  0.05 % ophthalmic emulsion Place 1 drop into both eyes 2 (two) times daily. 10/18/23   [provider]  rosuvastatin  (CRESTOR ) 5 MG tablet TAKE 1 TABLET (5 MG TOTAL) BY MOUTH DAILY. 03/30/24 10/05/25  [name], MD                                                                                                                                    Past Surgical History Past Surgical History:  Procedure Laterality Date   BIOPSY PROSTATE  2003 and 2005   COLONOSCOPY  11/28/2008   Dr. Rourk:internal hemorrhoids/tortus colon/ascending colon polyps, tubular adenomas   COLONOSCOPY N/A 09/04/2014   Procedure: COLONOSCOPY;  Surgeon: Lamar CHRISTELLA Hollingshead, MD;  Location: [facility];  Service: Endoscopy;  Laterality: N/A;  9:30   COLONOSCOPY N/A 07/17/2017   Procedure: COLONOSCOPY;  Surgeon: Harvey Margo CROME, MD;  Location: [facility];  Service: Endoscopy;  Laterality: N/A;    COLONOSCOPY WITH PROPOFOL  N/A 10/05/2023   Procedure: COLONOSCOPY WITH PROPOFOL ;  Surgeon: Hollingshead Lamar CHRISTELLA, MD;  Location: [facility];  Service: Endoscopy;  Laterality: N/A;  10:00AM, ASA 3   ESOPHAGOGASTRODUODENOSCOPY N/A 07/16/2017   Procedure: ESOPHAGOGASTRODUODENOSCOPY (EGD);  Surgeon: Harvey Margo CROME, MD;  Location: [facility];  Service: Endoscopy;  Laterality: N/A;   GIVENS CAPSULE STUDY  07/16/2017   Procedure: GIVENS CAPSULE STUDY;  Surgeon: Harvey Margo CROME, MD;  Location: [facility];  Service: Endoscopy;;   JOINT REPLACEMENT N/A    Phreesia 01/12/2021   POLYPECTOMY  07/17/2017   Procedure: POLYPECTOMY;  Surgeon: Harvey Margo CROME, MD;  Location: [facility];  Service: Endoscopy;;  cecal   POLYPECTOMY  10/05/2023   Procedure: POLYPECTOMY;  Surgeon: [name], MD;  Location: [facility];  Service: Endoscopy;;   PORTACATH PLACEMENT Left 04/18/2022   Procedure: INSERTION PORT-A-CATH;  Surgeon: Mavis Anes, MD;  Location: [facility];  Service: General;  Laterality: Left;   TOTAL KNEE ARTHROPLASTY Right 06/10/2017   TOTAL KNEE ARTHROPLASTY Right 06/10/2017   Procedure: TOTAL KNEE ARTHROPLASTY;  Surgeon: Beverley Toribio FALCON, MD;  Location: [facility] OR;  Service: Orthopedics;  Laterality: Right;   Family History Family History  Problem Relation Age of Onset   Diabetes Mother    Hypertension Mother    Obesity Mother    Heart disease Mother    Mental illness Mother    Hypertension Sister    Obesity Sister    Arthritis Sister    Deep vein thrombosis Father    Dementia Father    Heart disease Father        CABG   Colon cancer Neg Hx     Social History Social History[1] Allergies Sulfa antibiotics  Review of Systems Review of Systems  All other systems reviewed and are negative.   Physical Exam Vital Signs  I have reviewed the triage vital signs BP 137/78   Pulse 91   Temp 97.9 F (36.6 C) (Oral)   Resp 15   SpO2 97%  Physical Exam Vitals and nursing note  reviewed.  Constitutional:      General: He is not in acute distress.    Appearance: Normal appearance.  HENT:     Mouth/Throat:     Mouth: Mucous membranes are dry.  Eyes:     Conjunctiva/sclera: Conjunctivae normal.  Cardiovascular:     Rate and Rhythm: Normal rate and regular rhythm.  Pulmonary:     Effort: Pulmonary effort is normal. No respiratory distress.     Breath sounds: Normal breath sounds.  Abdominal:     General: Abdomen is flat.     Palpations: Abdomen is soft.     Tenderness: There is abdominal tenderness (epigastric region).  Musculoskeletal:     Right lower leg: No edema.     Left lower leg: No edema.  Skin:    General: Skin is warm and dry.     Capillary Refill: Capillary refill takes less than 2 seconds.  Neurological:     Mental Status: He is alert and oriented to person, place, and time. Mental status is at baseline.  Psychiatric:        Mood and Affect: Mood normal.        Behavior: Behavior normal.     ED Results and Treatments Labs (all labs ordered are listed, but only abnormal results are displayed) Labs Reviewed  COMPREHENSIVE METABOLIC PANEL WITH GFR - Abnormal; Notable for the following components:      Result Value   Glucose, Bld 157 (*)    Creatinine, Ser 1.37 (*)    GFR, Estimated 54 (*)    Anion gap 18 (*)    All other components within normal limits  CBC - Abnormal; Notable for the following components:   RBC 3.74 (*)    Hemoglobin 11.9 (*)    HCT 36.0 (*)    RDW 16.0 (*)    All other  components within normal limits  LIPASE, BLOOD  URINALYSIS, ROUTINE W REFLEX MICROSCOPIC                                                                                                                          Radiology CT ABDOMEN PELVIS W CONTRAST Result Date: 01/03/2025 EXAM: CT ABDOMEN AND PELVIS WITH CONTRAST 01/03/2025 11:29:28 PM TECHNIQUE: CT of the abdomen and pelvis was performed with the administration of intravenous contrast.  Multiplanar reformatted images are provided for review. Automated exposure control, iterative reconstruction, and/or weight-based adjustment of the mA/kV was utilized to reduce the radiation dose to as low as reasonably achievable. 100 mL of iohexol  (OMNIPAQUE ) 300 MG/ML solution was administered. COMPARISON: 12/09/2024 CLINICAL HISTORY: Bowel obstruction. Nausea, abdominal pain, vomiting. FINDINGS: LOWER CHEST: Bilateral posterior basal consolidation and superimposed atelectasis suggestive of subacute infection or aspiration. LIVER: Innumerable simple cysts are noted throughout the liver. No enhancing intrahepatic mass. No intra or extrahepatic biliary ductal dilation. GALLBLADDER AND BILE DUCTS: Gallbladder is unremarkable. No biliary ductal dilatation. SPLEEN: No acute abnormality. PANCREAS: No acute abnormality. ADRENAL GLANDS: No acute abnormality. KIDNEYS, URETERS AND BLADDER: A simple cortical cyst is present within the right kidney, for which no follow-up imaging is recommended. No stones in the kidneys or ureters. No hydronephrosis. No perinephric or periureteral stranding. The bladder is not distended. GI AND BOWEL: Duodenal malrotation is present. The stomach and proximal duodenum are fluid-filled and mildly dilated, similar to prior examination, with an abrupt transition at the juncture of the second and third portion of the duodenum where this appears encased by an infiltrative process described below. Mechanical obstruction secondary to the infiltrative process is suspected. The stomach, small bowel, and large bowel are otherwise unremarkable. The appendix is normal. PERITONEUM AND RETROPERITONEUM: An infiltrative soft tissue process encasing the right renal vasculature and extending from the interval region of the right kidney to encase the juncture of the second and third portion of the duodenum is again identified and is grossly unchanged, measuring roughly 5.5 x 5.7 cm (volume 89.9 cm3, calculated  as 5.5 x 5.7 x 5.7 x 0.52). Differential considerations should include neoplastic processes such as lymphoma or inflammatory conditions such as IgG4 related disease or sarcoidosis. No ascites. No free air. VASCULATURE: Aorta is normal in caliber. Mild aortoiliac atherosclerotic calcification. No aortic aneurysm. LYMPH NODES: No lymphadenopathy. REPRODUCTIVE ORGANS: The prostate gland is massively enlarged. A 60 mm hypodense nodule within the right peripheral zone is nonspecific; however, a focal neoplasm is not excluded and correlation with serum PSA level is recommended. Prostate MRI examination would also be helpful for further evaluation. 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 Similar changes are noted within the left pubic symphysis. No suspicious lytic or blastic bone lesions are seen. No focal soft tissue abnormality. IMPRESSION: 1. Mechanical gastric and proximal duodenal obstruction at the junction of the second and third portions of the duodenum, related to  the infiltrative process described below. 2. Infiltrative soft tissue encasing the right renal vasculature and extending to the duodenum, grossly unchanged, measuring roughly 5.5 x 5.7 cm, with differential considerations including lymphoma and inflammatory conditions such as IgG4-related disease or sarcoidosis. Correlation with serum laboratory examination including total IgG level may be helpful for further evaluation 3. Bilateral posterior basal consolidation and superimposed atelectasis, suggestive of subacute infection or aspiration. 4. Massive prostatomegaly with a 16 mm hypodense nodule in the right peripheral zone, with focal neoplasm not excluded, with PSA evaluation and prostate MRI suggested for further assessment. 5. Duodenal malrotation. 6. 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 7. 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 Raf score includes aortic atherosclerosis (ICD10-I70.0). Electronically signed by: Dorethia Molt MD 01/03/2025 11:56 PM EST RP Workstation: [id number]    Pertinent labs & imaging results that were available during my care of the patient were reviewed by me and considered in my medical decision making (see MDM for details).  Medications Ordered in ED Medications  sodium chloride  0.9 % bolus 1,000 mL (1,000 mLs Intravenous New Bag/Given 01/03/25 2253)  ondansetron  (ZOFRAN ) injection 4 mg (4 mg Intravenous Given 01/03/25 2253)  iohexol  (OMNIPAQUE ) 300 MG/ML solution 100 mL (100 mLs Intravenous Contrast Given 01/03/25 2316)                                                                                                                                     Procedures Procedures  (including critical care time)  Medical Decision Making / ED Course   MDM:  74 year old presenting to the emergency department pain and discomfort.  Patient overall well-appearing, appears mildly dehydrated, vital signs stable.  Physical examination is with some epigastric tenderness, otherwise unremarkable.  Concern for possible persistent mass, SBO given recent diagnosis.  Will obtain CT scan.  Labs are concerning for dehydration.  Will give IV fluids.  Differentials includes other process such as gastroenteritis, flu or COVID.  Will reassess.    Clinical Course as of 01/04/25 0017  Wed Jan 04, 2025  0016 Signed out to Dr. Bari pending CT scan. [WS]    Clinical Course User Index [WS] Francesca Elsie CROME, MD     Additional history obtained: -Additional history obtained from family -External records from outside source obtained and reviewed including: Chart review including previous notes, labs, imaging, consultation notes including prior admission notes    Lab Tests: -I ordered, reviewed, and interpreted labs.   The pertinent results include:   Labs Reviewed  COMPREHENSIVE METABOLIC PANEL  WITH GFR - Abnormal; Notable for the following components:      Result Value   Glucose, Bld 157 (*)    Creatinine, Ser 1.37 (*)    GFR, Estimated 54 (*)    Anion gap 18 (*)    All other components within normal limits  CBC - Abnormal;  Notable for the following components:   RBC 3.74 (*)    Hemoglobin 11.9 (*)    HCT 36.0 (*)    RDW 16.0 (*)    All other components within normal limits  LIPASE, BLOOD  URINALYSIS, ROUTINE W REFLEX MICROSCOPIC    Notable for aki   Medicines ordered and prescription drug management: Meds ordered this encounter  Medications   sodium chloride  0.9 % bolus 1,000 mL   ondansetron  (ZOFRAN ) injection 4 mg   iohexol  (OMNIPAQUE ) 300 MG/ML solution 100 mL    -I have reviewed the patients home medicines and have made adjustments as needed  Co morbidities that complicate the patient evaluation  Past Medical History:  Diagnosis Date   Arthritis    Asbestosis (HCC)    Closed fracture of distal end of fibula with tibia with routine healing 05/28/2020   COVID-19 09/03/2021   Cyst of kidney, acquired    Cystic disease of liver    Enlarged prostate    Headache    History of fracture of leg    Right, childhood   Hx of migraine headaches    Hyperlipidemia    Multiple myeloma (HCC)    Neuropathy    PE (pulmonary thromboembolism) (HCC)    After knee surgery   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    Final Clinical Impression(s) / ED Diagnoses Final diagnoses:  Gastric outlet obstruction     This chart was dictated using voice recognition software.  Despite best efforts to proofread,  errors can occur which can change the documentation meaning.     [1]  Social History Tobacco Use   Smoking status: Never   Smokeless tobacco: Never  Vaping Use   Vaping status: Never Used  Substance  Use Topics   Alcohol use: No   Drug use: No     Francesca Elsie CROME, MD 01/04/25 0017  "

## 2025-01-04 NOTE — Plan of Care (Signed)

## 2025-01-04 NOTE — Consult Note (Signed)
 "  Gastroenterology Consult   Referring Provider: No ref. provider found Primary Care Physician:  [name], MD Primary Gastroenterologist:  Dr. Shaaron   Patient ID: Luis Stewart; [id number]; [birth year]   Admit date: 01/03/2025  LOS: 0 days   Date of Consultation: 01/04/2025  Reason for Consultation:  gastric outlet/duodenal obstruction  History of Present Illness   Luis Stewart is a 74 y.o. year old male wit history of multiple myeloma, prostate cancer, recent admission for SBO due to retroperitoneal mass surrounding the duodenum presenting for nausea, vomiting. GI consulted for further evaluation   Lipase 15 CMP with creat 1.37/1.19  Hgb 11.9/ 11.4   -CT A/P with contrast:  Mechanical gastric and proximal duodenal obstruction at the junction of the second and third portions of the duodenum, related to the infiltrative process described below. 2. Infiltrative soft tissue encasing the right renal vasculature and extending to the duodenum, grossly unchanged, measuring roughly 5.5 x 5.7 cm, with differential considerations including lymphoma and inflammatory conditions such as IgG4-related disease or sarcoidosis. Correlation with serum laboratory examination including total IgG level may be helpful for further evaluation 3. Bilateral posterior basal consolidation and superimposed atelectasis, suggestive of subacute infection or aspiration. 4. Massive prostatomegaly with a 16 mm hypodense nodule in the right peripheral zone, with focal neoplasm not excluded, with PSA evaluation and prostate MRI suggested for further assessment. 5. Duodenal malrotation. 6. Innumerable simple hepatic cysts in keeping with polycystic liver disease  Consult: Patient states he had PET Scan in June where they saw some changes but were not overly concerned. He states he previously followed with Dr. Rogers.  He reports over the past few weeks he has had more diarrhea,  difficulty keeping food down and having low appetite. Was hospitalized for diarrhea about 3 weeks ago with some improvement prior to christmas and felt things were improving but then he began to start feeling bad again and has not been able to eat or drink anything over the last few days. Has been having some solid bowel movements recently, last was yesterday afternoon. No real pain at this time. States he vomits a little while after he eats or drinks. He is having nausea. Was to have imaging guided biopsy earlier this month but this was not attempted because safe percutaneous window was not identified, they recommended possible endoscopy. He is currently being treated at [facility] for his MM with plans for another CT scan on 1/21 and an upper endoscopy with biopsy on 1/22.  No rectal bleeding or melena. Does not take any blood thinners.   Colonoscopy: 09/2023  - Non-bleeding internal hemorrhoids.                           - One 5 mm polyp at the hepatic flexure, removed                            with a cold snare. Resected and retrieved.                           - The examination was otherwise normal on direct                            and retroflexion views.   Past Medical History:  Diagnosis Date   Arthritis    Asbestosis (  HCC)    Closed fracture of distal end of fibula with tibia with routine healing 05/28/2020   COVID-19 09/03/2021   Cyst of kidney, acquired    Cystic disease of liver    Enlarged prostate    Headache    History of fracture of leg    Right, childhood   Hx of migraine headaches    Hyperlipidemia    Multiple myeloma (HCC)    Neuropathy    PE (pulmonary thromboembolism) (HCC)    After knee surgery   Prostate cancer (HCC) 2015   Adenocarcinoma by biopsy    Past Surgical History:  Procedure Laterality Date   BIOPSY PROSTATE  2003 and 2005   COLONOSCOPY  11/28/2008   Dr. Rourk:internal hemorrhoids/tortus colon/ascending colon polyps, tubular adenomas   COLONOSCOPY  N/A 09/04/2014   Procedure: COLONOSCOPY;  Surgeon: Lamar CHRISTELLA Hollingshead, MD;  [facility];  Service: Endoscopy;  Laterality: N/A;  9:30   COLONOSCOPY N/A 07/17/2017   Procedure: COLONOSCOPY;  Surgeon: Harvey Margo CROME, MD;  [facility];  Service: Endoscopy;  Laterality: N/A;   COLONOSCOPY WITH PROPOFOL  N/A 10/05/2023   Procedure: COLONOSCOPY WITH PROPOFOL ;  Surgeon: Hollingshead Lamar CHRISTELLA, MD;  [facility];  Service: Endoscopy;  Laterality: N/A;  10:00AM, ASA 3   ESOPHAGOGASTRODUODENOSCOPY N/A 07/16/2017   Procedure: ESOPHAGOGASTRODUODENOSCOPY (EGD);  Surgeon: Harvey Margo CROME, MD;  [facility];  Service: Endoscopy;  Laterality: N/A;   GIVENS CAPSULE STUDY  07/16/2017   Procedure: GIVENS CAPSULE STUDY;  Surgeon: Harvey Margo CROME, MD;  [facility];  Service: Endoscopy;;   JOINT REPLACEMENT N/A    Phreesia 01/12/2021   POLYPECTOMY  07/17/2017   Procedure: POLYPECTOMY;  Surgeon: Harvey Margo CROME, MD;  [facility];  Service: Endoscopy;;  cecal   POLYPECTOMY  10/05/2023   Procedure: POLYPECTOMY;  Surgeon: Hollingshead Lamar CHRISTELLA, MD;  [facility];  Service: Endoscopy;;   PORTACATH PLACEMENT Left 04/18/2022   Procedure: INSERTION PORT-A-CATH;  Surgeon: Mavis Anes, MD;  Location: [facility];  Service: General;  Laterality: Left;   TOTAL KNEE ARTHROPLASTY Right 06/10/2017   TOTAL KNEE ARTHROPLASTY Right 06/10/2017   Procedure: TOTAL KNEE ARTHROPLASTY;  Surgeon: Beverley Toribio FALCON, MD;  Location: [facility];  Service: Orthopedics;  Laterality: Right;    Prior to Admission medications  Medication Sig Start Date End Date Taking? Authorizing Provider  acetaminophen  (TYLENOL ) 500 MG tablet Take 1,000 mg by mouth every 6 (six) hours as needed for mild pain or moderate pain.    [provider]  acyclovir  (ZOVIRAX ) 400 MG tablet Take 1 tablet (400 mg total) by mouth 2 (two) times daily. 12/27/24   Davonna Siad, MD  albuterol  (VENTOLIN  HFA) 108 (90  Base) MCG/ACT inhaler Inhale 2 puffs into the lungs every 6 (six) hours as needed for wheezing or shortness of breath. 09/06/24   [name], MD  Cholecalciferol (VITAMIN D ) 2000 units CAPS Take 2,000 Units by mouth every other day.    [provider]  dexamethasone  (DECADRON ) 4 MG tablet Take 20 mg 30 minutes prior to treatment Patient taking differently: Take 20 mg by mouth See admin instructions. Take 20 mg 30 minutes prior to treatment 11/08/24   Davonna Siad, MD  DULoxetine  (CYMBALTA ) 60 MG capsule TAKE 1 CAPSULE BY MOUTH EVERY DAY 11/29/24   Patel, Rutwik K, MD  ferrous sulfate 325 (65 FE) MG EC tablet Take 325 mg by mouth daily.    [provider]  lidocaine -prilocaine  (EMLA ) cream Apply to affected area once 12/05/24   Kandala, Hyndavi, MD  Multiple Vitamin (MULTIVITAMIN WITH MINERALS) TABS tablet Take 1 tablet by mouth daily with breakfast. Centrum Silver for Men    [provider]  nitroGLYCERIN  (NITROSTAT ) 0.4 MG SL tablet Place 1 tablet (0.4 mg total) under the tongue every 5 (five) minutes x 3 doses as needed for chest pain (If no relief after 3rd dose, call or go to ED.). 11/20/22   Miriam Norris, NP  ondansetron  (ZOFRAN ) 8 MG tablet Take 1 tablet (8 mg total) by mouth every 8 (eight) hours as needed for nausea or vomiting. 12/05/24   Davonna Siad, MD  pantoprazole  (PROTONIX ) 40 MG tablet Take 1 tablet (40 mg total) by mouth daily. 12/11/24   Ricky Fines, MD  pomalidomide  (POMALYST ) 3 MG capsule Take 1 capsule (3 mg total) by mouth daily. 21 days on, 7 days off. 12/02/24   Davonna Siad, MD  potassium chloride  (KLOR-CON ) 10 MEQ tablet Take 1 tablet (10 mEq total) by mouth daily. 09/22/24 12/27/24  Kandala, Hyndavi, MD  prochlorperazine  (COMPAZINE ) 10 MG tablet Take 1 tablet (10 mg total) by mouth every 6 (six) hours as needed for nausea or vomiting. 12/05/24   Davonna Siad, MD  RESTASIS  0.05 % ophthalmic emulsion Place 1 drop into both eyes 2  (two) times daily. 10/18/23   [provider]  rosuvastatin  (CRESTOR ) 5 MG tablet TAKE 1 TABLET (5 MG TOTAL) BY MOUTH DAILY. 03/30/24 10/05/25  [name], MD    Current Facility-Administered Medications  Medication Dose Route Frequency Provider Last Rate Last Admin   acetaminophen  (TYLENOL ) tablet 650 mg  650 mg Oral Q6H PRN Adefeso, Oladapo, DO       Or   acetaminophen  (TYLENOL ) suppository 650 mg  650 mg Rectal Q6H PRN Adefeso, Oladapo, DO       lactated ringers  infusion   Intravenous Continuous Maree Bracken D, DO 70 mL/hr at 01/04/25 0841 New Bag at 01/04/25 0841   ondansetron  (ZOFRAN ) tablet 4 mg  4 mg Oral Q6H PRN Adefeso, Oladapo, DO       Or   ondansetron  (ZOFRAN ) injection 4 mg  4 mg Intravenous Q6H PRN Adefeso, Oladapo, DO   4 mg at 01/04/25 0146   pantoprazole  (PROTONIX ) injection 40 mg  40 mg Intravenous Q24H Adefeso, Oladapo, DO   40 mg at 01/04/25 0553   prochlorperazine  (COMPAZINE ) injection 10 mg  10 mg Intravenous Q6H PRN Adefeso, Oladapo, DO   10 mg at 01/04/25 0553   rosuvastatin  (CRESTOR ) tablet 5 mg  5 mg Oral Daily Adefeso, Oladapo, DO   5 mg at 01/04/25 9082   Facility-Administered Medications Ordered in Other Encounters  Medication Dose Route Frequency Provider Last Rate Last Admin   0.9 %  sodium chloride  infusion   Intravenous Continuous Kandala, Hyndavi, MD   Stopped at 12/05/24 1525    Allergies as of 01/03/2025 - Review Complete 01/03/2025  Allergen Reaction Noted   Sulfa antibiotics Other (See Comments) 05/26/2017    Family History  Problem Relation Age of Onset   Diabetes Mother    Hypertension Mother    Obesity Mother    Heart disease Mother    Mental illness Mother    Hypertension Sister    Obesity Sister    Arthritis Sister    Deep vein thrombosis Father    Dementia Father    Heart disease Father        CABG  Colon cancer Neg Hx     Social History   Socioeconomic History   Marital status: Married    Spouse name: Idell caldron    Number of children: Not on file   Years of education: Not on file   Highest education level: Not on file  Occupational History   Occupation: Retired  Tobacco Use   Smoking status: Never   Smokeless tobacco: Never  Vaping Use   Vaping status: Never Used  Substance and Sexual Activity   Alcohol use: No   Drug use: No   Sexual activity: Yes  Other Topics Concern   Not on file  Social History Narrative   Previously worked at [employer]   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   Social Drivers of Health   Tobacco Use: Low Risk (01/03/2025)   Patient History    Smoking Tobacco Use: Never    Smokeless Tobacco Use: Never    Passive Exposure: Not on file  Financial Resource Strain: Low Risk (04/12/2024)   Overall Financial Resource Strain (CARDIA)    Difficulty of Paying Living Expenses: Not hard at all  Food Insecurity: No Food Insecurity (01/04/2025)   Epic    Worried About Programme Researcher, Broadcasting/film/video in the Last Year: Never true    Ran Out of Food in the Last Year: Never true  Transportation Needs: No Transportation Needs (01/04/2025)   Epic    Lack of Transportation (Medical): No    Lack of Transportation (Non-Medical): No  Physical Activity: Sufficiently Active (04/12/2024)   Exercise Vital Sign    Days of Exercise per Week: 7 days    Minutes of Exercise per Session: 30 min  Stress: No Stress Concern Present (04/12/2024)   [employer] of Occupational Health - Occupational Stress Questionnaire    Feeling of Stress : Not at all  Social Connections: Socially Integrated (01/04/2025)   Social Connection and Isolation Panel    Frequency of Communication with Friends and Family: More than three times a week    Frequency of Social Gatherings with Friends and Family: More than three times a week    Attends Religious Services: More than 4 times per year    Active Member of Clubs or Organizations: Yes    Attends Banker Meetings: More than 4  times per year    Marital Status: Married  Catering Manager Violence: Not At Risk (01/04/2025)   Epic    Fear of Current or Ex-Partner: No    Emotionally Abused: No    Physically Abused: No    Sexually Abused: No  Depression (PHQ2-9): Low Risk (12/19/2024)   Depression (PHQ2-9)    PHQ-2 Score: 0  Alcohol Screen: Low Risk (04/12/2024)   Alcohol Screen    Last Alcohol Screening Score (AUDIT): 0  Housing: Low Risk (01/04/2025)   Epic    Unable to Pay for Housing in the Last Year: No    Number of Times Moved in the Last Year: 0    Homeless in the Last Year: No  Utilities: Not At Risk (01/04/2025)   Epic    Threatened with loss of utilities: No  Health Literacy: Adequate Health Literacy (04/12/2024)   B1300 Health Literacy    Frequency of need for help with medical instructions: Never     Review of Systems   Gen: Denies any fever, chills, loss of appetite, change in weight or weight loss CV: Denies chest pain, heart palpitations, syncope, edema  Resp: Denies shortness of breath with rest, cough, wheezing, coughing up blood, and pleurisy. GI: denies melena, hematochezia,diarrhea, constipation, dysphagia, odyonophagia, early satiety or weight loss. +nausea and vomiting  GU : Denies urinary burning, blood in urine, urinary frequency, and urinary incontinence. MS: Denies joint pain, limitation of movement, swelling, cramps, and atrophy.  Derm: Denies rash, itching, dry skin, hives. Psych: Denies depression, anxiety, memory loss, hallucinations, and confusion. Heme: Denies bruising or bleeding Neuro:  Denies any headaches, dizziness, paresthesias, shaking  Physical Exam   Vital Signs in last 24 hours: Temp:  [97.9 F (36.6 C)-98.8 F (37.1 C)] 98.4 F (36.9 C) (01/14 0722) Pulse Rate:  [87-108] 96 (01/14 0722) Resp:  [15-27] 17 (01/14 0722) BP: (122-143)/(65-95) 125/95 (01/14 0722) SpO2:  [94 %-100 %] 94 % (01/14 0722) Weight:  [72.4 kg] 72.4 kg (01/14 0117) Last BM Date :  01/03/25  General:   Alert,  Well-developed, well-nourished, pleasant and cooperative in NAD Head:  Normocephalic and atraumatic. Eyes:  Sclera clear, no icterus.   Conjunctiva pink. Ears:  Normal auditory acuity. Mouth:  No deformity or lesions, dentition normal. Neck:  Supple; no masses Lungs:  Clear throughout to auscultation.   No wheezes, crackles, or rhonchi. No acute distress. Heart:  Regular rate and rhythm; no murmurs, clicks, rubs,  or gallops. Abdomen:  Soft, and nondistended. Mild TTP of upper abdomen. No masses, hepatosplenomegaly or hernias noted. Normal bowel sounds, without guarding, and without rebound.   Msk:  Symmetrical without gross deformities. Normal posture. Extremities:  Without clubbing or edema. Neurologic:  Alert and  oriented x4. Skin:  Intact without significant lesions or rashes. Psych:  Alert and cooperative. Normal mood and affect.  Intake/Output from previous day: 01/13 0701 - 01/14 0700 In: 162.9 [I.V.:162.9] Out: -  Intake/Output this shift: No intake/output data recorded.   Labs/Studies   Recent Labs Recent Labs    01/03/25 2039 01/04/25 0449  WBC 7.6 7.2  HGB 11.9* 11.4*  HCT 36.0* 33.9*  PLT 229 175   BMET Recent Labs    01/03/25 2039 01/04/25 0449  NA 138 140  K 4.6 5.1  CL 99 102  CO2 22 25  GLUCOSE 157* 118*  BUN 21 21  CREATININE 1.37* 1.19  CALCIUM  9.2 8.4*   LFT Recent Labs    01/03/25 2039 01/04/25 0449  PROT 6.7 5.9*  ALBUMIN 3.6 3.2*  AST 15 13*  ALT 20 18  ALKPHOS 64 58  BILITOT 0.4 0.3   PT/INR No results for input(s): LABPROT, INR in the last 72 hours. Hepatitis Panel No results for input(s): HEPBSAG, HCVAB, HEPAIGM, HEPBIGM in the last 72 hours. C-Diff No results for input(s): CDIFFTOX in the last 72 hours.  Radiology/Studies CT ABDOMEN PELVIS W CONTRAST Result Date: 01/03/2025 EXAM: CT ABDOMEN AND PELVIS WITH CONTRAST 01/03/2025 11:29:28 PM TECHNIQUE: CT of the abdomen and pelvis  was performed with the administration of intravenous contrast. Multiplanar reformatted images are provided for review. Automated exposure control, iterative reconstruction, and/or weight-based adjustment of the mA/kV was utilized to reduce the radiation dose to as low as reasonably achievable. 100 mL of iohexol  (OMNIPAQUE ) 300 MG/ML solution was administered. COMPARISON: 12/09/2024 CLINICAL HISTORY: Bowel obstruction. Nausea, abdominal pain, vomiting. FINDINGS: LOWER CHEST: Bilateral posterior basal consolidation and superimposed atelectasis suggestive of subacute infection or aspiration. LIVER: Innumerable simple cysts are noted throughout the liver. No enhancing intrahepatic mass. No intra or extrahepatic biliary ductal dilation. GALLBLADDER AND BILE DUCTS: Gallbladder is unremarkable. No biliary ductal dilatation. SPLEEN:  No acute abnormality. PANCREAS: No acute abnormality. ADRENAL GLANDS: No acute abnormality. KIDNEYS, URETERS AND BLADDER: A simple cortical cyst is present within the right kidney, for which no follow-up imaging is recommended. No stones in the kidneys or ureters. No hydronephrosis. No perinephric or periureteral stranding. The bladder is not distended. GI AND BOWEL: Duodenal malrotation is present. The stomach and proximal duodenum are fluid-filled and mildly dilated, similar to prior examination, with an abrupt transition at the juncture of the second and third portion of the duodenum where this appears encased by an infiltrative process described below. Mechanical obstruction secondary to the infiltrative process is suspected. The stomach, small bowel, and large bowel are otherwise unremarkable. The appendix is normal. PERITONEUM AND RETROPERITONEUM: An infiltrative soft tissue process encasing the right renal vasculature and extending from the interval region of the right kidney to encase the juncture of the second and third portion of the duodenum is again identified and is grossly  unchanged, measuring roughly 5.5 x 5.7 cm (volume 89.9 cm3, calculated as 5.5 x 5.7 x 5.7 x 0.52). Differential considerations should include neoplastic processes such as lymphoma or inflammatory conditions such as IgG4 related disease or sarcoidosis. No ascites. No free air. VASCULATURE: Aorta is normal in caliber. Mild aortoiliac atherosclerotic calcification. No aortic aneurysm. LYMPH NODES: No lymphadenopathy. REPRODUCTIVE ORGANS: The prostate gland is massively enlarged. A 60 mm hypodense nodule within the right peripheral zone is nonspecific; however, a focal neoplasm is not excluded and correlation with serum PSA level is recommended. Prostate MRI examination would also be helpful for further evaluation. 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 Similar changes are noted within the left pubic symphysis. No suspicious lytic or blastic bone lesions are seen. No focal soft tissue abnormality. IMPRESSION: 1. Mechanical gastric and proximal duodenal obstruction at the junction of the second and third portions of the duodenum, related to the infiltrative process described below. 2. Infiltrative soft tissue encasing the right renal vasculature and extending to the duodenum, grossly unchanged, measuring roughly 5.5 x 5.7 cm, with differential considerations including lymphoma and inflammatory conditions such as IgG4-related disease or sarcoidosis. Correlation with serum laboratory examination including total IgG level may be helpful for further evaluation 3. Bilateral posterior basal consolidation and superimposed atelectasis, suggestive of subacute infection or aspiration. 4. Massive prostatomegaly with a 16 mm hypodense nodule in the right peripheral zone, with focal neoplasm not excluded, with PSA evaluation and prostate MRI suggested for further assessment. 5. Duodenal malrotation. 6. 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  7. 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 Raf score includes aortic atherosclerosis (ICD10-I70.0). Electronically signed by: Dorethia Molt MD 01/03/2025 11:56 PM EST RP Workstation: [id number]     Assessment   Luis Stewart is a 74 y.o. year old male with history of multiple myeloma, prostate cancer, recent admission for SBO due to retroperitoneal mass surrounding the duodenum presenting for nausea, vomiting. GI consulted for further evaluation   Gastric and duodenal obstruction: -history of MM -PET scan in June with hypermetabolism corresponding to new right abdominal wall interstitial thickening area -CT guided biopsy attempted earlier this month but unable to perform -notes diarrhea, nausea, vomiting and abdominal pain he was admitted for in December which improved then began having nausea and vomiting again over the last few days -unable to keep down any foods, minimal liquids -having solid stools without bleeding or  melena -denies abdominal pain currently -CT this admission Mechanical gastric and proximal duodenal obstruction at the junction of the second and third portions of the duodenum, related to the infiltrative process (differentials include lymphoma/inflammatory conditions such as IgG4 related disease or sarcoidosis), duodenal malrotation  -planned upper endoscopy on 1/22 for possible biopsy  At this time, recommending EGD this admission for further evaluation and possible biopsy here given intolerance to POs. Indications, risks and benefits of procedure discussed in detail with patient. Patient verbalized understanding and is in agreement to proceed with EGD.    Plan / Recommendations   Remain NPO EGD later today with Dr. Shaaron with biopsy if possible PPI BID Further recommendations to follow Continue supportive measures    01/04/2025, 9:26 AM  Yilia Sacca L. Mariette, MSN, APRN, AGNP-C Adult-Gerontology Nurse  Practitioner [facility] at [facility]

## 2025-01-04 NOTE — Anesthesia Procedure Notes (Signed)
 Procedure Name: Intubation Date/Time: 01/04/2025 2:10 PM  Performed by: Elaine Delon CROME, CRNAPre-anesthesia Checklist: Patient identified, Emergency Drugs available, Suction available and Patient being monitored Patient Re-evaluated:Patient Re-evaluated prior to induction Oxygen  Delivery Method: Circle system utilized Preoxygenation: Pre-oxygenation with 100% oxygen  Induction Type: IV induction Ventilation: Mask ventilation without difficulty Laryngoscope Size: Mac and 4 Grade View: Grade I Tube type: Oral Tube size: 7.0 mm Number of attempts: 1 Airway Equipment and Method: Stylet Placement Confirmation: ETT inserted through vocal cords under direct vision, positive ETCO2 and breath sounds checked- equal and bilateral Secured at: 22 cm Tube secured with: Tape Dental Injury: Teeth and Oropharynx as per pre-operative assessment

## 2025-01-04 NOTE — Progress Notes (Signed)
 Luis Stewart is a 74 y.o. male with medical history significant of multiple myeloma, hyperlipidemia, prostate cancer, history of hepatic and renal cysts, GERD, hyperlipidemia who presents to the emergency department due to nausea and vomiting which started on Sunday (1/11), this was associated with upper abdominal discomfort, decreased appetite with last good meal being on Saturday (1/10). He was recently admitted here from 12/09/2024 through 12/11/2024 due to retroperitoneal mass.  Oncology (Dr. Davonna) was consulted and referred patient to interventional radiology.  Unfortunately, IR felt that it was not safe to do the procedure based on the location and patient was referred to GI for EUS with biopsy at [facility], since patient is going there for [facility].  Patient was admitted after midnight and seen and evaluated at bedside with ongoing trouble with nausea and vomiting.  He was admitted for gastric outlet obstruction in the setting of multiple myeloma with history of retroperitoneal mass.  He was seen by GI and underwent EGD on 1/14 with biopsies taken of the duodenum and noted inflamed areas with strictures in the duodenal region.  He has now been started on a clear liquid diet.  Follow-up a.m. labs and remain on IV fluid.  Appreciate ongoing GI recommendations.  Total care time: 35 minutes.

## 2025-01-04 NOTE — Op Note (Signed)
 [facility] Patient Name: Luis Stewart Procedure Date: 01/04/2025 1:40 PM MRN: [id number] Date of Birth: 08-23-1951 Attending MD: Luis Stewart , MD, [id number] CSN: [id number] Age: 74 Admit Type: Inpatient Procedure:                Upper GI endoscopy Indications:              Nausea with vomiting; CT evidence of obstruction at                            the level of D2/D3 Providers:                Luis Ozell Hollingshead, MD, Devere Lodge, Daphne Mulch                            Technician, Technician Referring MD:              Medicines:                Monitored Anesthesia Care Complications:            No immediate complications. Estimated Blood Loss:     Estimated blood loss was minimal. Procedure:                Pre-Anesthesia Assessment:                           - Prior to the procedure, a History and Physical                            was performed, and patient medications and                            allergies were reviewed. The patient's tolerance of                            previous anesthesia was also reviewed. The risks                            and benefits of the procedure and the sedation                            options and risks were discussed with the patient.                            All questions were answered, and informed consent                            was obtained. Prior Anticoagulants: The patient has                            taken no anticoagulant or antiplatelet agents. ASA                            Grade Assessment: III - A patient with severe  systemic disease. After reviewing the risks and                            benefits, the patient was deemed in satisfactory                            condition to undergo the procedure.                           After obtaining informed consent, the endoscope was                            passed under direct vision. Throughout the                             procedure, the patient's blood pressure, pulse, and                            oxygen  saturations were monitored continuously. The                            [id number] ([id number])Leezm was introduced through the                            mouth, and advanced to the third part of duodenum.                            The upper GI endoscopy was accomplished without                            difficulty. The patient tolerated the procedure                            well. Scope In: 2:13:06 PM Scope Out: 2:39:00 PM Total Procedure Duration: 0 hours 25 minutes 54 seconds  Findings:      The examined esophagus was normal. Gastric cavity with a large amount of       fluid which was fairly easily suctioned out. Gastric mucosa diffusely       injected with intense erythema and friability/maceration. Did not see an       infiltrating process. Pylorus patent. Pylorus patent. Bulb and D2       slightly dilated. I reached the distal segment of D2 and ran out of       scope. I withdrew and up obtained the pediatric colonoscope was able to       advance down into D3 there is approximately 6 to 8 cm segment of       markedly edematous mucosa with apparent extrinsic compression producing       significant compromise of the lumen(see image 6). It was not fixed. I       was able negotiate the scope through this and see what appeared to be       normal duodenum distally. Biopsies of abnormal duodenal segment along       with the gastric mucosa taken for histologic study.. Impression:               -  Normal esophagus.                           -Dilated, inflamed appearing stomachlikely                            secondary to stasis.Status post biopsy                           - Dilated duodenum with marked narrowing                            inflammation/extrinsic compression involving a                            segment of D2/D3status post biopsy                           Findings of duodenal  obstruction may be caused                            more by external component. Biopsies may not be                            diagnostic. Palliative surgical bypass may be                            beneficial. Moderate Sedation:      Moderate (conscious) sedation was personally administered by an       anesthesia professional. The following parameters were monitored: oxygen        saturation, heart rate, blood pressure, respiratory rate, EKG, adequacy       of pulmonary ventilation, and response to care. Recommendation:           - Return patient to hospital ward for ongoing care.                           - Clear liquid diet.                           - Continue present medications. Follow-up on                            pathology. Further recommendations to follow. At                            patient request, I called Luis Stewart at                            [phone]. Reviewed findings and                            recommendations. Her questions were answered. Procedure Code(s):        --- Professional ---                           [phone], Esophagogastroduodenoscopy, flexible,  transoral; diagnostic, including collection of                            specimen(s) by brushing or washing, when performed                            (separate procedure) Diagnosis Code(s):        --- Professional ---                           R11.2, Nausea with vomiting, unspecified CPT copyright 2022 American Medical Association. All rights reserved. The codes documented in this report are preliminary and upon coder review may  be revised to meet current compliance requirements. Luis Stewart. Luis Nawabi, MD Luis Ozell Hollingshead, MD 01/04/2025 3:18:49 PM This report has been signed electronically. Number of Addenda: 0

## 2025-01-04 NOTE — Transfer of Care (Signed)
 Immediate Anesthesia Transfer of Care Note  Patient: Luis Stewart  Procedure(s) Performed: EGD (ESOPHAGOGASTRODUODENOSCOPY)  Patient Location: PACU  Anesthesia Type:General  Level of Consciousness: awake, drowsy, and patient cooperative  Airway & Oxygen  Therapy: Patient Spontanous Breathing and Patient connected to face mask oxygen   Post-op Assessment: Report given to RN, Post -op Vital signs reviewed and stable, and Patient moving all extremities X 4  Post vital signs: Reviewed and stable  Last Vitals:  Vitals Value Taken Time  BP 131/91 1450  Temp 97.1 1450  Pulse 79 1450  Resp 25 1450  SpO2 100 1450    Last Pain:  Vitals:   01/04/25 1406  TempSrc:   PainSc: 0-No pain      Patients Stated Pain Goal: 8 (01/04/25 1158)  Complications: No notable events documented.

## 2025-01-05 ENCOUNTER — Inpatient Hospital Stay: Attending: Hematology | Admitting: Oncology

## 2025-01-05 ENCOUNTER — Encounter (HOSPITAL_COMMUNITY): Payer: Self-pay | Admitting: Internal Medicine

## 2025-01-05 DIAGNOSIS — K311 Adult hypertrophic pyloric stenosis: Secondary | ICD-10-CM | POA: Diagnosis not present

## 2025-01-05 DIAGNOSIS — R112 Nausea with vomiting, unspecified: Secondary | ICD-10-CM

## 2025-01-05 DIAGNOSIS — K315 Obstruction of duodenum: Secondary | ICD-10-CM

## 2025-01-05 LAB — COMPREHENSIVE METABOLIC PANEL WITH GFR
ALT: 13 U/L (ref 0–44)
AST: 11 U/L — ABNORMAL LOW (ref 15–41)
Albumin: 2.9 g/dL — ABNORMAL LOW (ref 3.5–5.0)
Alkaline Phosphatase: 49 U/L (ref 38–126)
Anion gap: 11 (ref 5–15)
BUN: 24 mg/dL — ABNORMAL HIGH (ref 8–23)
CO2: 25 mmol/L (ref 22–32)
Calcium: 8.1 mg/dL — ABNORMAL LOW (ref 8.9–10.3)
Chloride: 104 mmol/L (ref 98–111)
Creatinine, Ser: 1.21 mg/dL (ref 0.61–1.24)
GFR, Estimated: >60 mL/min
Glucose, Bld: 109 mg/dL — ABNORMAL HIGH (ref 70–99)
Potassium: 4.8 mmol/L (ref 3.5–5.1)
Sodium: 141 mmol/L (ref 135–145)
Total Bilirubin: 0.3 mg/dL (ref 0.0–1.2)
Total Protein: 5.4 g/dL — ABNORMAL LOW (ref 6.5–8.1)

## 2025-01-05 LAB — CBC
HCT: 35.4 % — ABNORMAL LOW (ref 39.0–52.0)
Hemoglobin: 11.3 g/dL — ABNORMAL LOW (ref 13.0–17.0)
MCH: 31.3 pg (ref 26.0–34.0)
MCHC: 31.9 g/dL (ref 30.0–36.0)
MCV: 98.1 fL (ref 80.0–100.0)
Platelets: 197 K/uL (ref 150–400)
RBC: 3.61 MIL/uL — ABNORMAL LOW (ref 4.22–5.81)
RDW: 16.2 % — ABNORMAL HIGH (ref 11.5–15.5)
WBC: 5.5 K/uL (ref 4.0–10.5)
nRBC: 0 % (ref 0.0–0.2)

## 2025-01-05 LAB — MAGNESIUM: Magnesium: 2 mg/dL (ref 1.7–2.4)

## 2025-01-05 MED ORDER — ADULT MULTIVITAMIN LIQUID CH
15.0000 mL | Freq: Every day | ORAL | Status: DC
  Administered 2025-01-06: 15 mL via ORAL
  Filled 2025-01-05 (×3): qty 15

## 2025-01-05 MED ORDER — ONDANSETRON 4 MG PO TBDP
4.0000 mg | ORAL_TABLET | Freq: Three times a day (TID) | ORAL | Status: DC | PRN
  Administered 2025-01-05 – 2025-01-06 (×4): 4 mg via ORAL
  Filled 2025-01-05 (×4): qty 1

## 2025-01-05 MED ORDER — CYCLOSPORINE 0.05 % OP EMUL
1.0000 [drp] | Freq: Two times a day (BID) | OPHTHALMIC | Status: DC
  Administered 2025-01-05 – 2025-01-06 (×2): 1 [drp] via OPHTHALMIC
  Filled 2025-01-05 (×2): qty 30

## 2025-01-05 MED ORDER — ACYCLOVIR 800 MG PO TABS
400.0000 mg | ORAL_TABLET | Freq: Two times a day (BID) | ORAL | Status: DC
  Administered 2025-01-06: 400 mg via ORAL
  Filled 2025-01-05: qty 1

## 2025-01-05 MED ORDER — DULOXETINE HCL 60 MG PO CPEP
60.0000 mg | ORAL_CAPSULE | Freq: Every day | ORAL | Status: DC
  Administered 2025-01-06: 60 mg via ORAL
  Filled 2025-01-05: qty 1

## 2025-01-05 NOTE — Progress Notes (Signed)
 " PROGRESS NOTE    Luis Stewart  FMW:[id number] DOB: 1951/01/26 DOA: 01/03/2025 PCP: [name], MD   Brief Narrative:    Luis Stewart is a 74 y.o. male with medical history significant of multiple myeloma, hyperlipidemia, prostate cancer, history of hepatic and renal cysts, GERD, hyperlipidemia who presents to the emergency department due to nausea and vomiting which started on Sunday (1/11), this was associated with upper abdominal discomfort, decreased appetite with last good meal being on Saturday (1/10). He was recently admitted here from 12/09/2024 through 12/11/2024 due to retroperitoneal mass.  Oncology (Dr. Davonna) was consulted and referred patient to interventional radiology.  Unfortunately, IR felt that it was not safe to do the procedure based on the location and patient was referred to GI for EUS with biopsy at [facility], since patient is going there for [facility].  He was admitted for gastric outlet obstruction in the setting of multiple myeloma with history of retroperitoneal mass.  He was seen by GI and underwent EGD on 1/14 with biopsies taken of the duodenum and noted inflamed areas with strictures in the duodenal region.  He has now been started on a clear liquid diet.    Assessment & Plan:   Principal Problem:   Gastric outlet obstruction  Assessment and Plan:   Duodenal obstruction This was due to patient's history of retroperitoneal mass.  Patient was referred to GI at [facility] for EUS with biopsy.  Continue clear liquid diet and advance as tolerated per GI recommendations Status post EGD 1/14 with biopsies taken and obstruction noted in the duodenal region Continue IV Protonix  40 mg daily Continue Zofran  p.r.n. for nausea/vomiting   Nausea and vomiting Continue Zofran  as needed   Acute kidney injury-improving Creatinine 1.37 (baseline creatinine 0.9-1.1) Hold IV hydration unless diet is poorly tolerated and monitor Renally  adjust medications, avoid nephrotoxic agents/dehydration/hypotension   Mixed hyperlipidemia Continue statin   GERD Continue Protonix    Multiple myeloma Prostate cancer Patient will continue outpatient follow-up with oncology    DVT prophylaxis: SCDs Code Status: Full Family Communication: Spouse at bedside 1/15 Disposition Plan:  Status is: Inpatient Remains inpatient appropriate because: Need for IV medications   Consultants:  GI  Procedures:  EGD with biopsy 1/14  Antimicrobials:  None   Subjective: Patient seen and evaluated today and states that he did have some nausea and vomiting after supper yesterday.  He is eager to try breakfast today and appears to be tolerating this a little bit better.  Denies any abdominal pain.  Objective: Vitals:   01/04/25 1515 01/04/25 1556 01/04/25 2033 01/05/25 0613  BP:  133/74 (!) 155/97 126/84  Pulse: 76 77 90 80  Resp: (!) 22 18 20 18   Temp:  (!) 97.4 F (36.3 C) 97.7 F (36.5 C) (!) 97.4 F (36.3 C)  TempSrc:  Oral Oral Oral  SpO2: 100% 97% 98% 99%  Weight:      Height:        Intake/Output Summary (Last 24 hours) at 01/05/2025 1029 Last data filed at 01/05/2025 0500 Gross per 24 hour  Intake 400 ml  Output 600 ml  Net -200 ml   Filed Weights   01/04/25 0117 01/04/25 1158  Weight: 72.4 kg 72.1 kg    Examination:  General exam: Appears calm and comfortable  Respiratory system: Clear to auscultation. Respiratory effort normal. Cardiovascular system: S1 & S2 heard, RRR.  Gastrointestinal system: Abdomen is soft Central nervous system: Alert and awake Extremities: No  edema Skin: No significant lesions noted Psychiatry: Flat affect.    Data Reviewed: I have personally reviewed following labs and imaging studies  CBC: Recent Labs  Lab 01/03/25 2039 01/04/25 0449 01/05/25 0509  WBC 7.6 7.2 5.5  HGB 11.9* 11.4* 11.3*  HCT 36.0* 33.9* 35.4*  MCV 96.3 95.0 98.1  PLT 229 175 197   Basic Metabolic  Panel: Recent Labs  Lab 01/03/25 2039 01/04/25 0449 01/05/25 0509  NA 138 140 141  K 4.6 5.1 4.8  CL 99 102 104  CO2 22 25 25   GLUCOSE 157* 118* 109*  BUN 21 21 24*  CREATININE 1.37* 1.19 1.21  CALCIUM  9.2 8.4* 8.1*  MG  --  2.3 2.0  PHOS  --  3.4  --    GFR: Estimated Creatinine Clearance: 54.6 mL/min (by C-G formula based on SCr of 1.21 mg/dL). Liver Function Tests: Recent Labs  Lab 01/03/25 2039 01/04/25 0449 01/05/25 0509  AST 15 13* 11*  ALT 20 18 13   ALKPHOS 64 58 49  BILITOT 0.4 0.3 0.3  PROT 6.7 5.9* 5.4*  ALBUMIN 3.6 3.2* 2.9*   Recent Labs  Lab 01/03/25 2039  LIPASE 15   No results for input(s): AMMONIA in the last 168 hours. Coagulation Profile: No results for input(s): INR, PROTIME in the last 168 hours. Cardiac Enzymes: No results for input(s): CKTOTAL, CKMB, CKMBINDEX, TROPONINI in the last 168 hours. BNP (last 3 results) No results for input(s): PROBNP in the last 8760 hours. HbA1C: No results for input(s): HGBA1C in the last 72 hours. CBG: No results for input(s): GLUCAP in the last 168 hours. Lipid Profile: No results for input(s): CHOL, HDL, LDLCALC, TRIG, CHOLHDL, LDLDIRECT in the last 72 hours. Thyroid  Function Tests: No results for input(s): TSH, T4TOTAL, FREET4, T3FREE, THYROIDAB in the last 72 hours. Anemia Panel: No results for input(s): VITAMINB12, FOLATE, FERRITIN, TIBC, IRON, RETICCTPCT in the last 72 hours. Sepsis Labs: No results for input(s): PROCALCITON, LATICACIDVEN in the last 168 hours.  No results found for this or any previous visit (from the past 240 hours).       Radiology Studies: CT ABDOMEN PELVIS W CONTRAST Result Date: 01/03/2025 EXAM: CT ABDOMEN AND PELVIS WITH CONTRAST 01/03/2025 11:29:28 PM TECHNIQUE: CT of the abdomen and pelvis was performed with the administration of intravenous contrast. Multiplanar reformatted images are provided for review.  Automated exposure control, iterative reconstruction, and/or weight-based adjustment of the mA/kV was utilized to reduce the radiation dose to as low as reasonably achievable. 100 mL of iohexol  (OMNIPAQUE ) 300 MG/ML solution was administered. COMPARISON: 12/09/2024 CLINICAL HISTORY: Bowel obstruction. Nausea, abdominal pain, vomiting. FINDINGS: LOWER CHEST: Bilateral posterior basal consolidation and superimposed atelectasis suggestive of subacute infection or aspiration. LIVER: Innumerable simple cysts are noted throughout the liver. No enhancing intrahepatic mass. No intra or extrahepatic biliary ductal dilation. GALLBLADDER AND BILE DUCTS: Gallbladder is unremarkable. No biliary ductal dilatation. SPLEEN: No acute abnormality. PANCREAS: No acute abnormality. ADRENAL GLANDS: No acute abnormality. KIDNEYS, URETERS AND BLADDER: A simple cortical cyst is present within the right kidney, for which no follow-up imaging is recommended. No stones in the kidneys or ureters. No hydronephrosis. No perinephric or periureteral stranding. The bladder is not distended. GI AND BOWEL: Duodenal malrotation is present. The stomach and proximal duodenum are fluid-filled and mildly dilated, similar to prior examination, with an abrupt transition at the juncture of the second and third portion of the duodenum where this appears encased by an infiltrative process described below. Mechanical obstruction  secondary to the infiltrative process is suspected. The stomach, small bowel, and large bowel are otherwise unremarkable. The appendix is normal. PERITONEUM AND RETROPERITONEUM: An infiltrative soft tissue process encasing the right renal vasculature and extending from the interval region of the right kidney to encase the juncture of the second and third portion of the duodenum is again identified and is grossly unchanged, measuring roughly 5.5 x 5.7 cm (volume 89.9 cm3, calculated as 5.5 x 5.7 x 5.7 x 0.52). Differential considerations  should include neoplastic processes such as lymphoma or inflammatory conditions such as IgG4 related disease or sarcoidosis. No ascites. No free air. VASCULATURE: Aorta is normal in caliber. Mild aortoiliac atherosclerotic calcification. No aortic aneurysm. LYMPH NODES: No lymphadenopathy. REPRODUCTIVE ORGANS: The prostate gland is massively enlarged. A 60 mm hypodense nodule within the right peripheral zone is nonspecific; however, a focal neoplasm is not excluded and correlation with serum PSA level is recommended. Prostate MRI examination would also be helpful for further evaluation. 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 Similar changes are noted within the left pubic symphysis. No suspicious lytic or blastic bone lesions are seen. No focal soft tissue abnormality. IMPRESSION: 1. Mechanical gastric and proximal duodenal obstruction at the junction of the second and third portions of the duodenum, related to the infiltrative process described below. 2. Infiltrative soft tissue encasing the right renal vasculature and extending to the duodenum, grossly unchanged, measuring roughly 5.5 x 5.7 cm, with differential considerations including lymphoma and inflammatory conditions such as IgG4-related disease or sarcoidosis. Correlation with serum laboratory examination including total IgG level may be helpful for further evaluation 3. Bilateral posterior basal consolidation and superimposed atelectasis, suggestive of subacute infection or aspiration. 4. Massive prostatomegaly with a 16 mm hypodense nodule in the right peripheral zone, with focal neoplasm not excluded, with PSA evaluation and prostate MRI suggested for further assessment. 5. Duodenal malrotation. 6. 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 7. 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 Raf score includes aortic atherosclerosis  (ICD10-I70.0). Electronically signed by: Dorethia Molt MD 01/03/2025 11:56 PM EST RP Workstation: [id number]        Scheduled Meds:  feeding supplement  1 Container Oral TID BM   pantoprazole  (PROTONIX ) IV  40 mg Intravenous Q24H   rosuvastatin   5 mg Oral Daily     LOS: 1 day    Time spent: 55 minutes    Millie Shorb JONETTA Fairly, DO [facility]  If 7PM-7AM, please contact night-coverage [email or web address] 01/05/2025, 10:29 AM   "

## 2025-01-05 NOTE — Progress Notes (Signed)
 Mobility Specialist Progress Note:    01/05/25 1010  Mobility  Activity Ambulated with assistance  Level of Assistance Contact guard assist, steadying assist  Assistive Device None  Distance Ambulated (ft) 480 ft  Range of Motion/Exercises Active;All extremities  Activity Response Tolerated well  Mobility Referral Yes  Mobility visit 1 Mobility  Mobility Specialist Start Time (ACUTE ONLY) 1010  Mobility Specialist Stop Time (ACUTE ONLY) 1030  Mobility Specialist Time Calculation (min) (ACUTE ONLY) 20 min   Pt received in bed, agreeable to mobility. Required CGA to stand and ambulate with no AD. Tolerated well, asx throughout. Returned supine, wife in room. All needs met.  Avacyn Kloosterman Mobility Specialist Please contact via Special Educational Needs Teacher or  Rehab office at [phone]

## 2025-01-05 NOTE — Progress Notes (Signed)
 Has been nauseated intermittently throughout the day even with zofran  and vomited even after compazine .  Dr. Shaaron ordered for zofran  SL to be given before each meal.  Wife has been at bedside all day.

## 2025-01-05 NOTE — Progress Notes (Signed)
 Initial Nutrition Assessment  DOCUMENTATION CODES:  Severe malnutrition in context of acute illness/injury  INTERVENTION:  Continue Boost Breeze po TID, each supplement provides 250 kcal and 9 grams of protein When diet advanced, change supplement to Ensure Plus High Protein po TID for more calories & protein, each supplement provides 350 kcal and 20 grams of protein Liquid MVI with minerals daily  NUTRITION DIAGNOSIS:  Severe Malnutrition related to acute illness (GOO) as evidenced by moderate fat depletion, moderate muscle depletion, energy intake < or equal to 50% for > or equal to 5 days, percent weight loss (16.5% weight loss within 3 months).  GOAL:  Patient will meet greater than or equal to 90% of their needs  MONITOR:  PO intake, Supplement acceptance, Diet advancement  REASON FOR ASSESSMENT:  Malnutrition Screening Tool   ASSESSMENT:  74 yo male admitted with gastric outlet obstruction. PMH includes multiple myeloma, HLD, prostate cancer, hepatic and renal cysts, GERD, neuropathy.  Patient c/o nausea and vomiting since Monday. Prior to that, he had been eating less than normal d/t decreased appetite. He has lost a lot of weight, he thinks most of it has been within the past 3-4 weeks. He was drinking Ensure mixed with ice cream at home 1-2 times daily. He has been unable to tolerate any solid foods for several weeks. He is tolerating Boost Breeze.  1/14: S/P EGD with biopsies taken; duodenal obstruction was noted; may require surgical intervention    Patient meets criteria for severe malnutrition, given moderate depletion of muscle and subcutaneous fat mass, intake meeting < 50% of estimated energy requirement for > 5 days, and 16.5% weight loss within 3 months.  Usual weight: 86.3 kg (10/19/24) Current weight: 72.1 kg (01/04/25)  16.5% weight loss within 3 months is severe  Average Meal Intake: Not recorded, currently on clear liquids  Nutritionally Relevant  Medications: Scheduled Meds:  acyclovir   400 mg Oral BID   cycloSPORINE   1 drop Both Eyes BID   DULoxetine   60 mg Oral Daily   feeding supplement  1 Container Oral TID BM   pantoprazole  (PROTONIX ) IV  40 mg Intravenous Q24H   rosuvastatin   5 mg Oral Daily   Continuous Infusions: PRN Meds:.acetaminophen  **OR** acetaminophen , ondansetron , prochlorperazine   Labs Reviewed.  NUTRITION - FOCUSED PHYSICAL EXAM: Flowsheet Row Most Recent Value  Orbital Region Moderate depletion  Upper Arm Region Unable to assess  Thoracic and Lumbar Region Moderate depletion  Buccal Region Moderate depletion  Temple Region Moderate depletion  Clavicle Bone Region Moderate depletion  Clavicle and Acromion Bone Region Moderate depletion  Scapular Bone Region Moderate depletion  Dorsal Hand Moderate depletion  Patellar Region Unable to assess  Anterior Thigh Region Unable to assess  Posterior Calf Region Moderate depletion  Edema (RD Assessment) None  Hair Reviewed  Eyes Reviewed  Mouth Reviewed  Skin Reviewed  Nails Reviewed   Diet Order:   Diet Order             DIET DYS 2 Room service appropriate? Yes; Fluid consistency: Thin  Diet effective now                   EDUCATION NEEDS:  Not appropriate for education at this time  Skin:  Skin Assessment: Reviewed RN Assessment  Last BM:  1/14  Height:  Ht Readings from Last 1 Encounters:  01/04/25 6' 2 (1.88 m)   Weight:  Wt Readings from Last 1 Encounters:  01/04/25 72.1 kg   Ideal Body  Weight:  86.4 kg  BMI:  Body mass index is 20.41 kg/m.  Estimated Nutritional Needs:  Kcal:  2000-2200 Protein:  100-120 gm Fluid:  2-2.2 L   Suzen HUNT RD, LDN, CNSC Contact via secure chat. If unavailable, use group chat RD Inpatient.

## 2025-01-05 NOTE — Progress Notes (Signed)
 "  Gastroenterology Progress Note   Referring Provider: No ref. provider found Primary Care Physician:  [name], MD Primary Gastroenterologist:  Ozell Hollingshead, MD  Patient ID: Luis Stewart; [id number]; May 12, 1951   Subjective:    Tolerated breakfast this morning, clear liquids. Taking his time. Denies abdominal pain. Last BM Tuesday.   Objective:   Vital signs in last 24 hours: Temp:  [97.1 F (36.2 C)-98 F (36.7 C)] 97.4 F (36.3 C) (01/15 9386) Pulse Rate:  [70-99] 80 (01/15 0613) Resp:  [18-22] 18 (01/15 0613) BP: (126-155)/(74-97) 126/84 (01/15 9386) SpO2:  [97 %-100 %] 99 % (01/15 9386) Weight:  [72.1 kg] 72.1 kg (01/14 1158) Last BM Date : 01/03/25 General:   Alert,  Well-developed, well-nourished, pleasant and cooperative in NAD Head:  Normocephalic and atraumatic. Eyes:  Sclera clear, no icterus.   Abdomen:  Soft, nontender and nondistended.  Normal bowel sounds, without guarding, and without rebound.   Extremities:  Without clubbing, deformity or edema. Neurologic:  Alert and  oriented x4;  grossly normal neurologically. Skin:  Intact without significant lesions or rashes. Psych:  Alert and cooperative. Normal mood and affect.  Intake/Output from previous day: 01/14 0701 - 01/15 0700 In: 400 [I.V.:400] Out: 1200 [Urine:600; Emesis/NG output:600] Intake/Output this shift: No intake/output data recorded.  Lab Results: CBC Recent Labs    01/03/25 2039 01/04/25 0449 01/05/25 0509  WBC 7.6 7.2 5.5  HGB 11.9* 11.4* 11.3*  HCT 36.0* 33.9* 35.4*  MCV 96.3 95.0 98.1  PLT 229 175 197   BMET Recent Labs    01/03/25 2039 01/04/25 0449 01/05/25 0509  NA 138 140 141  K 4.6 5.1 4.8  CL 99 102 104  CO2 22 25 25   GLUCOSE 157* 118* 109*  BUN 21 21 24*  CREATININE 1.37* 1.19 1.21  CALCIUM  9.2 8.4* 8.1*   LFTs Recent Labs    01/03/25 2039 01/04/25 0449 01/05/25 0509  BILITOT 0.4 0.3 0.3  ALKPHOS 64 58 49  AST 15 13* 11*  ALT 20  18 13   PROT 6.7 5.9* 5.4*  ALBUMIN 3.6 3.2* 2.9*   Recent Labs    01/03/25 2039  LIPASE 15   PT/INR No results for input(s): LABPROT, INR in the last 72 hours.       Imaging Studies: CT ABDOMEN PELVIS W CONTRAST Result Date: 01/03/2025 EXAM: CT ABDOMEN AND PELVIS WITH CONTRAST 01/03/2025 11:29:28 PM TECHNIQUE: CT of the abdomen and pelvis was performed with the administration of intravenous contrast. Multiplanar reformatted images are provided for review. Automated exposure control, iterative reconstruction, and/or weight-based adjustment of the mA/kV was utilized to reduce the radiation dose to as low as reasonably achievable. 100 mL of iohexol  (OMNIPAQUE ) 300 MG/ML solution was administered. COMPARISON: 12/09/2024 CLINICAL HISTORY: Bowel obstruction. Nausea, abdominal pain, vomiting. FINDINGS: LOWER CHEST: Bilateral posterior basal consolidation and superimposed atelectasis suggestive of subacute infection or aspiration. LIVER: Innumerable simple cysts are noted throughout the liver. No enhancing intrahepatic mass. No intra or extrahepatic biliary ductal dilation. GALLBLADDER AND BILE DUCTS: Gallbladder is unremarkable. No biliary ductal dilatation. SPLEEN: No acute abnormality. PANCREAS: No acute abnormality. ADRENAL GLANDS: No acute abnormality. KIDNEYS, URETERS AND BLADDER: A simple cortical cyst is present within the right kidney, for which no follow-up imaging is recommended. No stones in the kidneys or ureters. No hydronephrosis. No perinephric or periureteral stranding. The bladder is not distended. GI AND BOWEL: Duodenal malrotation is present. The stomach and proximal duodenum are fluid-filled and mildly dilated, similar to  prior examination, with an abrupt transition at the juncture of the second and third portion of the duodenum where this appears encased by an infiltrative process described below. Mechanical obstruction secondary to the infiltrative process is suspected. The  stomach, small bowel, and large bowel are otherwise unremarkable. The appendix is normal. PERITONEUM AND RETROPERITONEUM: An infiltrative soft tissue process encasing the right renal vasculature and extending from the interval region of the right kidney to encase the juncture of the second and third portion of the duodenum is again identified and is grossly unchanged, measuring roughly 5.5 x 5.7 cm (volume 89.9 cm3, calculated as 5.5 x 5.7 x 5.7 x 0.52). Differential considerations should include neoplastic processes such as lymphoma or inflammatory conditions such as IgG4 related disease or sarcoidosis. No ascites. No free air. VASCULATURE: Aorta is normal in caliber. Mild aortoiliac atherosclerotic calcification. No aortic aneurysm. LYMPH NODES: No lymphadenopathy. REPRODUCTIVE ORGANS: The prostate gland is massively enlarged. A 60 mm hypodense nodule within the right peripheral zone is nonspecific; however, a focal neoplasm is not excluded and correlation with serum PSA level is recommended. Prostate MRI examination would also be helpful for further evaluation. 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 Similar changes are noted within the left pubic symphysis. No suspicious lytic or blastic bone lesions are seen. No focal soft tissue abnormality. IMPRESSION: 1. Mechanical gastric and proximal duodenal obstruction at the junction of the second and third portions of the duodenum, related to the infiltrative process described below. 2. Infiltrative soft tissue encasing the right renal vasculature and extending to the duodenum, grossly unchanged, measuring roughly 5.5 x 5.7 cm, with differential considerations including lymphoma and inflammatory conditions such as IgG4-related disease or sarcoidosis. Correlation with serum laboratory examination including total IgG level may be helpful for further evaluation 3. Bilateral posterior basal consolidation and  superimposed atelectasis, suggestive of subacute infection or aspiration. 4. Massive prostatomegaly with a 16 mm hypodense nodule in the right peripheral zone, with focal neoplasm not excluded, with PSA evaluation and prostate MRI suggested for further assessment. 5. Duodenal malrotation. 6. 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 7. 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 Raf score includes aortic atherosclerosis (ICD10-I70.0). Electronically signed by: Dorethia Molt MD 01/03/2025 11:56 PM EST RP Workstation: [id number]   CT ABDOMEN LIMITED W CONTRAST Result Date: 12/26/2024 CLINICAL DATA:  74 year old with history of myeloma and abnormal soft tissue in the right retroperitoneum. Patient presents for image guided biopsy. EXAM: CT ABDOMEN LIMITED WITH CONTRAST TECHNIQUE: Patient was placed prone on the CT scanner. Non contrast images were obtained of the abdomen. The right retroperitoneal soft tissue was difficult to characterize. Therefore, additional CT images were obtained following administration of intravenous contrast. RADIATION DOSE REDUCTION: This exam was performed according to the departmental dose-optimization program which includes automated exposure control, adjustment of the mA and/or kV according to patient size and/or use of iterative reconstruction technique. CONTRAST:  75mL OMNIPAQUE  IOHEXOL  350 MG/ML SOLN COMPARISON:  12/09/2024 FINDINGS: Lower chest: Patchy densities at the lung bases could be related to atelectasis. Hepatobiliary: Innumerable hepatic cysts. Mild distention of the gallbladder. There may be wall thickening near the base of the gallbladder. Pancreas: Unremarkable. No pancreatic ductal dilatation or surrounding inflammatory changes. Spleen: Normal in size without focal abnormality. Adrenals/Urinary Tract: Adrenal glands are within normal limits. Low-density material in the right retroperitoneum surrounding the  second and third  portions of the duodenum and along the medial aspect the right kidney surrounding the right perihilar structures. This right retroperitoneal material was more dense or enhancing on the exam from 12/09/2024. Multiple right renal cysts. Normal appearance of the left kidney. No hydronephrosis. Stomach/Bowel: Again noted is abnormal soft tissue and stranding surrounding the second and third portions of the duodenum. Normal appearance of stomach without distension. No significant bowel dilatation. Vascular/Lymphatic: Abnormal stranding or soft tissue in the right retroperitoneum between the right kidney and duodenum. No significant lymph node enlargement in the visualized abdomen. Other: Negative for ascites. Musculoskeletal: No acute bone abnormality. IMPRESSION: 1. Persistent abnormal soft tissue or stranding in the right retroperitoneum situated between the duodenum and the right kidney. In addition, there is concern for wall thickening or abnormality at the base of the gallbladder. These findings remain nonspecific but cannot exclude a neoplastic process. 2. Patient was scheduled for image guided biopsy. Biopsy was not attempted because a safe percutaneous window was not identified due to the right perihilar vasculature and the right ureter. Consider GI consultation for endoscopy and EUS. Electronically Signed   By: Juliene Balder M.D.   On: 12/26/2024 15:07   Acute Abdominal Series Result Date: 12/11/2024 EXAM: UPRIGHT AND SUPINE XRAY VIEWS OF THE ABDOMEN AND 4 VIEW(S) OF THE CHEST 12/11/2024 12:42:07 AM COMPARISON: 12/10/2024 CLINICAL HISTORY: Small bowel obstruction (HCC) FINDINGS: LUNGS AND PLEURA: Left basilar opacity. Probable small left pleural effusion. No consolidation or pulmonary edema. No pneumothorax. HEART AND MEDIASTINUM: Left chest port remains in place. No acute abnormality of the cardiac and mediastinal silhouettes. BOWEL: Scattered loops of large and small bowel are noted. No  obstructive changes are noted. PERITONEUM AND SOFT TISSUES: No abnormal calcifications. No free air. BONES: No acute osseous abnormality. IMPRESSION: 1. No radiographic bowel obstruction or free air. 2. Left basilar opacity and probable small left pleural effusion. Electronically signed by: Oneil Devonshire MD 12/11/2024 12:54 AM EST RP Workstation: [id number]   DG Chest Portable 1 View Result Date: 12/10/2024 EXAM: 1 VIEW(S) XRAY OF THE CHEST 12/10/2024 01:59:51 AM COMPARISON: Comparison with 03/22/2024 and CT 09/05/2024. CLINICAL HISTORY: tube placement FINDINGS: LINES, TUBES AND DEVICES: Left chest wall port with the tip in the mid SVC. Subdiaphragmatic enteric tube. LUNGS AND PLEURA: Bibasilar atelectasis versus scarring. The lungs are otherwise clear. No pleural effusion. No pneumothorax. HEART AND MEDIASTINUM: No acute abnormality of the cardiac and mediastinal silhouettes. BONES AND SOFT TISSUES: No acute osseous abnormality. IMPRESSION: 1. Left chest wall port with the tip in the mid SVC and subdiaphragmatic enteric tube. 2. No acute findings. Electronically signed by: Norman Gatlin MD 12/10/2024 02:05 AM EST RP Workstation: [id number]   CT ABDOMEN PELVIS W CONTRAST Result Date: 12/10/2024 CLINICAL DATA:  Left lower quadrant pain EXAM: CT ABDOMEN AND PELVIS WITH CONTRAST TECHNIQUE: Multidetector CT imaging of the abdomen and pelvis was performed using the standard protocol following bolus administration of intravenous contrast. RADIATION DOSE REDUCTION: This exam was performed according to the departmental dose-optimization program which includes automated exposure control, adjustment of the mA and/or kV according to patient size and/or use of iterative reconstruction technique. CONTRAST:  OMNIPAQUE  IOHEXOL  300 MG/ML  SOLN COMPARISON:  PET CT 06/16/2024, CT abdomen pelvis 04/26/2022 FINDINGS: Lower chest: Lung bases demonstrate mild dependent atelectasis. Hepatobiliary: Numerous hepatic cysts and  additional subcentimeter hypodensities too small to further characterize. No calcified gallstones or biliary dilatation. Pancreas: Unremarkable. No pancreatic ductal dilatation or surrounding inflammatory changes. Spleen: Normal in size without focal abnormality.  Adrenals/Urinary Tract: Adrenal glands are normal. Kidneys show no hydronephrosis. Right renal cyst for which no imaging follow-up is recommended. The bladder is unremarkable. Stomach/Bowel: Prominent gastric distension with debris. Dilated duodenal bulb. Abnormal appearance of the second and proximal third portion of duodenum. Abnormal mass or masslike process in the right retroperitoneum, appears associated with the second and proximal third aspect of duodenum which appear abnormally thickened. Mass like area measures about 5.7 x 5.4 cm on series 3, image 39. It extends to the right renal hilar region on series 3, image 35 and appears to surround right renal hilar vessels. Vascular/Lymphatic: Aortic atherosclerosis. No aneurysm. No suspicious lymph nodes Reproductive: Markedly enlarged prostate with mass effect on the bladder Other: No ascites or free air. Musculoskeletal: Heterogenous sclerosis and lucency involving the pelvic bones is grossly stable compared with the prior exam and could reflect Paget's disease. IMPRESSION: 1. Abnormal mass or masslike process in the right retroperitoneum, this appears to involve the second and third portion of duodenum and results in upstream obstruction of the stomach and duodenal bulb. Surgical consultation is recommended. Mass like process extends to the hilar region of the right kidney and appears to encase hilar vessels. Pancreas abuts the masslike process but does not appear to be the origin of the mass. 2. Markedly enlarged prostate with mass effect on the bladder. 3. Heterogenous sclerosis and lucency involving the pelvic bones is grossly stable compared with the prior exam and could reflect Paget's disease. 4.  Aortic atherosclerosis. Aortic Atherosclerosis (ICD10-I70.0). Electronically Signed   By: Luke Bun M.D.   On: 12/10/2024 00:17  [2 weeks]  Assessment:   Luis Stewart is a 74 y.o. year old male with history of multiple myeloma, prostate cancer, recent admission for SBO due to retroperitoneal mass surrounding the duodenum presenting for nausea, vomiting. GI consulted for further evaluation    Gastric and duodenal obstruction: -history of MM -PET scan in June with hypermetabolism corresponding to new right abdominal wall interstitial thickening area -CT guided biopsy attempted earlier this month but unable to perform -CT this admission mechanical gastric and proximal duodenal obstruction at the junction of the second and third portions of the duodenum, related to the infiltrative process (differentials include lymphoma/inflammatory conditions such as IgG4 related disease or sarcoidosis), duodenal malrotation  -EGD this admission: -dilated, inflamed stomach ?stasis s/p bx -dilated duodenum with marked narrowing inflammation/extrinsic compression of D2/D3 s/p bx -findings of duodenal obstruction may be more due to external component, biopsies may not be diagnostic, palliative surgical bypass may be beneficial  Plan:   Continue PPI, increase to BID when transitioning to PO Await biopsy Continue clear liquids due to obstructive process, may be able to try full liquids if tolerates clears. May require surgical intervention due to obstructive process.   LOS: 1 day   Sonny RAMAN. Ezzard RIGGERS [facility] [phone] 1/15/20269:01 AM    "

## 2025-01-06 ENCOUNTER — Inpatient Hospital Stay (HOSPITAL_COMMUNITY)

## 2025-01-06 DIAGNOSIS — K315 Obstruction of duodenum: Secondary | ICD-10-CM | POA: Diagnosis not present

## 2025-01-06 DIAGNOSIS — R1011 Right upper quadrant pain: Secondary | ICD-10-CM | POA: Diagnosis not present

## 2025-01-06 DIAGNOSIS — Z7401 Bed confinement status: Secondary | ICD-10-CM | POA: Diagnosis not present

## 2025-01-06 DIAGNOSIS — K311 Adult hypertrophic pyloric stenosis: Secondary | ICD-10-CM | POA: Diagnosis not present

## 2025-01-06 LAB — COMPREHENSIVE METABOLIC PANEL WITH GFR
ALT: 12 U/L (ref 0–44)
AST: 12 U/L — ABNORMAL LOW (ref 15–41)
Albumin: 3.2 g/dL — ABNORMAL LOW (ref 3.5–5.0)
Alkaline Phosphatase: 51 U/L (ref 38–126)
Anion gap: 13 (ref 5–15)
BUN: 24 mg/dL — ABNORMAL HIGH (ref 8–23)
CO2: 24 mmol/L (ref 22–32)
Calcium: 7.8 mg/dL — ABNORMAL LOW (ref 8.9–10.3)
Chloride: 104 mmol/L (ref 98–111)
Creatinine, Ser: 1.13 mg/dL (ref 0.61–1.24)
GFR, Estimated: >60 mL/min
Glucose, Bld: 113 mg/dL — ABNORMAL HIGH (ref 70–99)
Potassium: 4.1 mmol/L (ref 3.5–5.1)
Sodium: 141 mmol/L (ref 135–145)
Total Bilirubin: 0.4 mg/dL (ref 0.0–1.2)
Total Protein: 5.4 g/dL — ABNORMAL LOW (ref 6.5–8.1)

## 2025-01-06 LAB — CBC
HCT: 34.6 % — ABNORMAL LOW (ref 39.0–52.0)
Hemoglobin: 11.4 g/dL — ABNORMAL LOW (ref 13.0–17.0)
MCH: 31.5 pg (ref 26.0–34.0)
MCHC: 32.9 g/dL (ref 30.0–36.0)
MCV: 95.6 fL (ref 80.0–100.0)
Platelets: 208 K/uL (ref 150–400)
RBC: 3.62 MIL/uL — ABNORMAL LOW (ref 4.22–5.81)
RDW: 16.1 % — ABNORMAL HIGH (ref 11.5–15.5)
WBC: 5.8 K/uL (ref 4.0–10.5)
nRBC: 0 % (ref 0.0–0.2)

## 2025-01-06 LAB — SURGICAL PATHOLOGY

## 2025-01-06 LAB — MAGNESIUM: Magnesium: 2 mg/dL (ref 1.7–2.4)

## 2025-01-06 MED ORDER — LACTATED RINGERS IV SOLN: INTRAVENOUS | Status: DC

## 2025-01-06 MED ORDER — PHENOL 1.4 % MT LIQD
1.0000 | OROMUCOSAL | Status: DC | PRN
  Filled 2025-01-06: qty 177

## 2025-01-06 NOTE — Plan of Care (Signed)
" °  Problem: Education: Goal: Knowledge of General Education information will improve Description: Including pain rating scale, medication(s)/side effects and non-pharmacologic comfort measures Outcome: Progressing   Problem: Clinical Measurements: Goal: Diagnostic test results will improve Outcome: Progressing   Problem: Nutrition: Goal: Adequate nutrition will be maintained Outcome: Progressing   Problem: Activity: Goal: Risk for activity intolerance will decrease Outcome: Progressing   Problem: Coping: Goal: Level of anxiety will decrease Outcome: Progressing   "

## 2025-01-06 NOTE — Progress Notes (Signed)
 Called report to [facility], [phone], spoke to [facility]. All questions and concerns answered. Patient is set to transfer with [facility].

## 2025-01-06 NOTE — Anesthesia Postprocedure Evaluation (Signed)
"   Anesthesia Post Note  Patient: Luis Stewart  Procedure(s) Performed: EGD (ESOPHAGOGASTRODUODENOSCOPY)  Anesthesia Type: General Anesthetic complications: no   No notable events documented.   Last Vitals:  Vitals:   01/06/25 0542 01/06/25 1454  BP: 113/84 (!) 127/105  Pulse: 95 95  Resp: 20 18  Temp: 36.5 C 36.4 C  SpO2: 99% 97%    Last Pain:  Vitals:   01/06/25 1454  TempSrc: Oral  PainSc:                  Yvonna PARAS Jaiona Simien      "

## 2025-01-06 NOTE — Consult Note (Signed)
 I was asked to see the patient in consult for his gastric outlet obstruction secondary to a retroperitoneal mass encasing the duodenum.  I personally reviewed the CT scan images.  I discussed the patient with Dr. Norleen Funk, Oncology at [facility], who last saw the patient on 12/29/2024.  He agrees and accepts patient for transfer to [facility]  Tissue diagnosis of this retroperitoneal mass still needs to be obtained and this could be done at the same time as a possible gastric bypass or gastrostomy tube placement.  Dr. Maree aware.  I did talk to the patient and family, who understand and agree to the transfer.

## 2025-01-06 NOTE — Discharge Summary (Signed)
 Physician Discharge Summary  Luis Stewart FMW:[id number] DOB: 1951/05/08 DOA: 01/03/2025  PCP: [name], MD  Admit date: 01/03/2025  Transfer date: 01/06/2025  Admitted From: Home  Disposition:  [facility]  Recommendations for Outpatient Follow-up:  Follow up with specialists at [facility].  Admitting physician Dr. Darren  Equipment/Devices: NG Tube  Discharge Condition:Stable  CODE STATUS: Full  Diet recommendation: NPO  Brief/Interim Summary: Luis Stewart is a 74 y.o. male with medical history significant of multiple myeloma, hyperlipidemia, prostate cancer, history of hepatic and renal cysts, GERD, hyperlipidemia who presents to the emergency department due to nausea and vomiting which started on Sunday (1/11), this was associated with upper abdominal discomfort, decreased appetite with last good meal being on Saturday (1/10). He was recently admitted here from 12/09/2024 through 12/11/2024 due to retroperitoneal mass.  Oncology (Dr. Davonna) was consulted and referred patient to interventional radiology.  Unfortunately, IR felt that it was not safe to do the procedure based on the location and patient was referred to GI for EUS with biopsy at [facility], since patient is going there for CAR-T.   He was admitted for gastric outlet obstruction in the setting of multiple myeloma with history of retroperitoneal mass.  He was seen by GI and underwent EGD on 1/14 with biopsies taken of the duodenum and noted inflamed areas with strictures in the duodenal region.  He has now been advanced to full liquid diet, but has difficulty tolerating this and therefore general surgery contacted for surgical options on 1/16.  He underwent evaluation and decision was made to call [facility] due to the complexity of this case.  Case was discussed with oncology who accepts patient in transfer for further evaluation and management.  He continues to have significant amounts of nausea and  vomiting for which NG tube has not been placed to low intermittent suction.  Bed is currently available and he is stable for transfer.  Discharge Diagnoses:  Principal Problem:   Gastric outlet obstruction Active Problems:   Duodenal obstruction  Principal discharge diagnosis: Gastric outlet obstruction in the setting of duodenal obstruction.  Discharge Instructions  Discharge Instructions     Increase activity slowly   Complete by: As directed       Allergies as of 01/06/2025       Reactions   Sulfa Antibiotics Other (See Comments)   Unknown reaction. Childhood reaction        Medication List     STOP taking these medications    acetaminophen  500 MG tablet Commonly known as: TYLENOL    acyclovir  400 MG tablet Commonly known as: ZOVIRAX    albuterol  108 (90 Base) MCG/ACT inhaler Commonly known as: VENTOLIN  HFA   Calcium  200 MG Tabs   dexamethasone  4 MG tablet Commonly known as: DECADRON    DULoxetine  60 MG capsule Commonly known as: CYMBALTA    ferrous sulfate 325 (65 FE) MG EC tablet   lidocaine -prilocaine  cream Commonly known as: EMLA    multivitamin with minerals Tabs tablet   nitroGLYCERIN  0.4 MG SL tablet Commonly known as: NITROSTAT    ondansetron  8 MG tablet Commonly known as: Zofran    pantoprazole  40 MG tablet Commonly known as: PROTONIX    pomalidomide  3 MG capsule Commonly known as: POMALYST    potassium chloride  10 MEQ tablet Commonly known as: KLOR-CON    prochlorperazine  10 MG tablet Commonly known as: COMPAZINE    Restasis  0.05 % ophthalmic emulsion Generic drug: cycloSPORINE    rosuvastatin  5 MG tablet Commonly known as: CRESTOR    Vitamin D  50  MCG (2000 UT) Caps        Allergies[1]  Consultations: GI General surgery   Procedures/Studies: CT ABDOMEN PELVIS W CONTRAST Result Date: 01/03/2025 EXAM: CT ABDOMEN AND PELVIS WITH CONTRAST 01/03/2025 11:29:28 PM TECHNIQUE: CT of the abdomen and pelvis was performed with the  administration of intravenous contrast. Multiplanar reformatted images are provided for review. Automated exposure control, iterative reconstruction, and/or weight-based adjustment of the mA/kV was utilized to reduce the radiation dose to as low as reasonably achievable. 100 mL of iohexol  (OMNIPAQUE ) 300 MG/ML solution was administered. COMPARISON: 12/09/2024 CLINICAL HISTORY: Bowel obstruction. Nausea, abdominal pain, vomiting. FINDINGS: LOWER CHEST: Bilateral posterior basal consolidation and superimposed atelectasis suggestive of subacute infection or aspiration. LIVER: Innumerable simple cysts are noted throughout the liver. No enhancing intrahepatic mass. No intra or extrahepatic biliary ductal dilation. GALLBLADDER AND BILE DUCTS: Gallbladder is unremarkable. No biliary ductal dilatation. SPLEEN: No acute abnormality. PANCREAS: No acute abnormality. ADRENAL GLANDS: No acute abnormality. KIDNEYS, URETERS AND BLADDER: A simple cortical cyst is present within the right kidney, for which no follow-up imaging is recommended. No stones in the kidneys or ureters. No hydronephrosis. No perinephric or periureteral stranding. The bladder is not distended. GI AND BOWEL: Duodenal malrotation is present. The stomach and proximal duodenum are fluid-filled and mildly dilated, similar to prior examination, with an abrupt transition at the juncture of the second and third portion of the duodenum where this appears encased by an infiltrative process described below. Mechanical obstruction secondary to the infiltrative process is suspected. The stomach, small bowel, and large bowel are otherwise unremarkable. The appendix is normal. PERITONEUM AND RETROPERITONEUM: An infiltrative soft tissue process encasing the right renal vasculature and extending from the interval region of the right kidney to encase the juncture of the second and third portion of the duodenum is again identified and is grossly unchanged, measuring roughly  5.5 x 5.7 cm (volume 89.9 cm3, calculated as 5.5 x 5.7 x 5.7 x 0.52). Differential considerations should include neoplastic processes such as lymphoma or inflammatory conditions such as IgG4 related disease or sarcoidosis. No ascites. No free air. VASCULATURE: Aorta is normal in caliber. Mild aortoiliac atherosclerotic calcification. No aortic aneurysm. LYMPH NODES: No lymphadenopathy. REPRODUCTIVE ORGANS: The prostate gland is massively enlarged. A 60 mm hypodense nodule within the right peripheral zone is nonspecific; however, a focal neoplasm is not excluded and correlation with serum PSA level is recommended. Prostate MRI examination would also be helpful for further evaluation. 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 Similar changes are noted within the left pubic symphysis. No suspicious lytic or blastic bone lesions are seen. No focal soft tissue abnormality. IMPRESSION: 1. Mechanical gastric and proximal duodenal obstruction at the junction of the second and third portions of the duodenum, related to the infiltrative process described below. 2. Infiltrative soft tissue encasing the right renal vasculature and extending to the duodenum, grossly unchanged, measuring roughly 5.5 x 5.7 cm, with differential considerations including lymphoma and inflammatory conditions such as IgG4-related disease or sarcoidosis. Correlation with serum laboratory examination including total IgG level may be helpful for further evaluation 3. Bilateral posterior basal consolidation and superimposed atelectasis, suggestive of subacute infection or aspiration. 4. Massive prostatomegaly with a 16 mm hypodense nodule in the right peripheral zone, with focal neoplasm not excluded, with PSA evaluation and prostate MRI suggested for further assessment. 5. Duodenal malrotation. 6. 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 7.  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 Raf score includes aortic atherosclerosis (ICD10-I70.0). Electronically signed by: Dorethia Molt MD 01/03/2025 11:56 PM EST RP Workstation: [id number]   CT ABDOMEN LIMITED W CONTRAST Result Date: 12/26/2024 CLINICAL DATA:  74 year old with history of myeloma and abnormal soft tissue in the right retroperitoneum. Patient presents for image guided biopsy. EXAM: CT ABDOMEN LIMITED WITH CONTRAST TECHNIQUE: Patient was placed prone on the CT scanner. Non contrast images were obtained of the abdomen. The right retroperitoneal soft tissue was difficult to characterize. Therefore, additional CT images were obtained following administration of intravenous contrast. RADIATION DOSE REDUCTION: This exam was performed according to the departmental dose-optimization program which includes automated exposure control, adjustment of the mA and/or kV according to patient size and/or use of iterative reconstruction technique. CONTRAST:  75mL OMNIPAQUE  IOHEXOL  350 MG/ML SOLN COMPARISON:  12/09/2024 FINDINGS: Lower chest: Patchy densities at the lung bases could be related to atelectasis. Hepatobiliary: Innumerable hepatic cysts. Mild distention of the gallbladder. There may be wall thickening near the base of the gallbladder. Pancreas: Unremarkable. No pancreatic ductal dilatation or surrounding inflammatory changes. Spleen: Normal in size without focal abnormality. Adrenals/Urinary Tract: Adrenal glands are within normal limits. Low-density material in the right retroperitoneum surrounding the second and third portions of the duodenum and along the medial aspect the right kidney surrounding the right perihilar structures. This right retroperitoneal material was more dense or enhancing on the exam from 12/09/2024. Multiple right renal cysts. Normal appearance of the left kidney. No hydronephrosis. Stomach/Bowel: Again noted is abnormal soft tissue and stranding surrounding the second  and third portions of the duodenum. Normal appearance of stomach without distension. No significant bowel dilatation. Vascular/Lymphatic: Abnormal stranding or soft tissue in the right retroperitoneum between the right kidney and duodenum. No significant lymph node enlargement in the visualized abdomen. Other: Negative for ascites. Musculoskeletal: No acute bone abnormality. IMPRESSION: 1. Persistent abnormal soft tissue or stranding in the right retroperitoneum situated between the duodenum and the right kidney. In addition, there is concern for wall thickening or abnormality at the base of the gallbladder. These findings remain nonspecific but cannot exclude a neoplastic process. 2. Patient was scheduled for image guided biopsy. Biopsy was not attempted because a safe percutaneous window was not identified due to the right perihilar vasculature and the right ureter. Consider GI consultation for endoscopy and EUS. Electronically Signed   By: Juliene Balder M.D.   On: 12/26/2024 15:07   Acute Abdominal Series Result Date: 12/11/2024 EXAM: UPRIGHT AND SUPINE XRAY VIEWS OF THE ABDOMEN AND 4 VIEW(S) OF THE CHEST 12/11/2024 12:42:07 AM COMPARISON: 12/10/2024 CLINICAL HISTORY: Small bowel obstruction (HCC) FINDINGS: LUNGS AND PLEURA: Left basilar opacity. Probable small left pleural effusion. No consolidation or pulmonary edema. No pneumothorax. HEART AND MEDIASTINUM: Left chest port remains in place. No acute abnormality of the cardiac and mediastinal silhouettes. BOWEL: Scattered loops of large and small bowel are noted. No obstructive changes are noted. PERITONEUM AND SOFT TISSUES: No abnormal calcifications. No free air. BONES: No acute osseous abnormality. IMPRESSION: 1. No radiographic bowel obstruction or free air. 2. Left basilar opacity and probable small left pleural effusion. Electronically signed by: Oneil Devonshire MD 12/11/2024 12:54 AM EST RP Workstation: [id number]   DG Chest Portable 1 View Result Date:  12/10/2024 EXAM: 1 VIEW(S) XRAY OF THE CHEST 12/10/2024 01:59:51 AM COMPARISON: Comparison with 03/22/2024 and CT 09/05/2024. CLINICAL HISTORY: tube placement FINDINGS: LINES, TUBES AND DEVICES: Left chest wall port  with the tip in the mid SVC. Subdiaphragmatic enteric tube. LUNGS AND PLEURA: Bibasilar atelectasis versus scarring. The lungs are otherwise clear. No pleural effusion. No pneumothorax. HEART AND MEDIASTINUM: No acute abnormality of the cardiac and mediastinal silhouettes. BONES AND SOFT TISSUES: No acute osseous abnormality. IMPRESSION: 1. Left chest wall port with the tip in the mid SVC and subdiaphragmatic enteric tube. 2. No acute findings. Electronically signed by: Norman Gatlin MD 12/10/2024 02:05 AM EST RP Workstation: [id number]   CT ABDOMEN PELVIS W CONTRAST Result Date: 12/10/2024 CLINICAL DATA:  Left lower quadrant pain EXAM: CT ABDOMEN AND PELVIS WITH CONTRAST TECHNIQUE: Multidetector CT imaging of the abdomen and pelvis was performed using the standard protocol following bolus administration of intravenous contrast. RADIATION DOSE REDUCTION: This exam was performed according to the departmental dose-optimization program which includes automated exposure control, adjustment of the mA and/or kV according to patient size and/or use of iterative reconstruction technique. CONTRAST:  OMNIPAQUE  IOHEXOL  300 MG/ML  SOLN COMPARISON:  PET CT 06/16/2024, CT abdomen pelvis 04/26/2022 FINDINGS: Lower chest: Lung bases demonstrate mild dependent atelectasis. Hepatobiliary: Numerous hepatic cysts and additional subcentimeter hypodensities too small to further characterize. No calcified gallstones or biliary dilatation. Pancreas: Unremarkable. No pancreatic ductal dilatation or surrounding inflammatory changes. Spleen: Normal in size without focal abnormality. Adrenals/Urinary Tract: Adrenal glands are normal. Kidneys show no hydronephrosis. Right renal cyst for which no imaging follow-up is  recommended. The bladder is unremarkable. Stomach/Bowel: Prominent gastric distension with debris. Dilated duodenal bulb. Abnormal appearance of the second and proximal third portion of duodenum. Abnormal mass or masslike process in the right retroperitoneum, appears associated with the second and proximal third aspect of duodenum which appear abnormally thickened. Mass like area measures about 5.7 x 5.4 cm on series 3, image 39. It extends to the right renal hilar region on series 3, image 35 and appears to surround right renal hilar vessels. Vascular/Lymphatic: Aortic atherosclerosis. No aneurysm. No suspicious lymph nodes Reproductive: Markedly enlarged prostate with mass effect on the bladder Other: No ascites or free air. Musculoskeletal: Heterogenous sclerosis and lucency involving the pelvic bones is grossly stable compared with the prior exam and could reflect Paget's disease. IMPRESSION: 1. Abnormal mass or masslike process in the right retroperitoneum, this appears to involve the second and third portion of duodenum and results in upstream obstruction of the stomach and duodenal bulb. Surgical consultation is recommended. Mass like process extends to the hilar region of the right kidney and appears to encase hilar vessels. Pancreas abuts the masslike process but does not appear to be the origin of the mass. 2. Markedly enlarged prostate with mass effect on the bladder. 3. Heterogenous sclerosis and lucency involving the pelvic bones is grossly stable compared with the prior exam and could reflect Paget's disease. 4. Aortic atherosclerosis. Aortic Atherosclerosis (ICD10-I70.0). Electronically Signed   By: Luke Bun M.D.   On: 12/10/2024 00:17     Discharge Exam: Vitals:   01/06/25 0542 01/06/25 1454  BP: 113/84 (!) 127/105  Pulse: 95 95  Resp: 20 18  Temp: 97.7 F (36.5 C) 97.6 F (36.4 C)  SpO2: 99% 97%   Vitals:   01/05/25 1340 01/05/25 2056 01/06/25 0542 01/06/25 1454  BP: (!) 144/91  (!) 142/84 113/84 (!) 127/105  Pulse: 88 97 95 95  Resp: (!) 24 12 20 18   Temp: 97.8 F (36.6 C) (!) 97.5 F (36.4 C) 97.7 F (36.5 C) 97.6 F (36.4 C)  TempSrc: Oral Oral Oral Oral  SpO2:  100% 100% 99% 97%  Weight:      Height:        General: Pt is alert, awake, not in acute distress Cardiovascular: RRR, S1/S2 +, no rubs, no gallops Respiratory: CTA bilaterally, no wheezing, no rhonchi Abdominal: Soft, NT, ND, bowel sounds + Extremities: no edema, no cyanosis    The results of significant diagnostics from this hospitalization (including imaging, microbiology, ancillary and laboratory) are listed below for reference.     Microbiology: No results found for this or any previous visit (from the past 240 hours).   Labs: BNP (last 3 results) No results for input(s): BNP in the last 8760 hours. Basic Metabolic Panel: Recent Labs  Lab 01/03/25 2039 01/04/25 0449 01/05/25 0509 01/06/25 0459  NA 138 140 141 141  K 4.6 5.1 4.8 4.1  CL 99 102 104 104  CO2 22 25 25 24   GLUCOSE 157* 118* 109* 113*  BUN 21 21 24* 24*  CREATININE 1.37* 1.19 1.21 1.13  CALCIUM  9.2 8.4* 8.1* 7.8*  MG  --  2.3 2.0 2.0  PHOS  --  3.4  --   --    Liver Function Tests: Recent Labs  Lab 01/03/25 2039 01/04/25 0449 01/05/25 0509 01/06/25 0459  AST 15 13* 11* 12*  ALT 20 18 13 12   ALKPHOS 64 58 49 51  BILITOT 0.4 0.3 0.3 0.4  PROT 6.7 5.9* 5.4* 5.4*  ALBUMIN 3.6 3.2* 2.9* 3.2*   Recent Labs  Lab 01/03/25 2039  LIPASE 15   No results for input(s): AMMONIA in the last 168 hours. CBC: Recent Labs  Lab 01/03/25 2039 01/04/25 0449 01/05/25 0509 01/06/25 0459  WBC 7.6 7.2 5.5 5.8  HGB 11.9* 11.4* 11.3* 11.4*  HCT 36.0* 33.9* 35.4* 34.6*  MCV 96.3 95.0 98.1 95.6  PLT 229 175 197 208   Cardiac Enzymes: No results for input(s): CKTOTAL, CKMB, CKMBINDEX, TROPONINI in the last 168 hours. BNP: Invalid input(s): POCBNP CBG: No results for input(s): GLUCAP in the last  168 hours. D-Dimer No results for input(s): DDIMER in the last 72 hours. Hgb A1c No results for input(s): HGBA1C in the last 72 hours. Lipid Profile No results for input(s): CHOL, HDL, LDLCALC, TRIG, CHOLHDL, LDLDIRECT in the last 72 hours. Thyroid  function studies No results for input(s): TSH, T4TOTAL, T3FREE, THYROIDAB in the last 72 hours.  Invalid input(s): FREET3 Anemia work up No results for input(s): VITAMINB12, FOLATE, FERRITIN, TIBC, IRON, RETICCTPCT in the last 72 hours. Urinalysis    Component Value Date/Time   COLORURINE YELLOW 01/04/2025 0204   APPEARANCEUR CLEAR 01/04/2025 0204   APPEARANCEUR Clear 04/28/2023 1616   LABSPEC 1.044 (H) 01/04/2025 0204   PHURINE 5.0 01/04/2025 0204   GLUCOSEU NEGATIVE 01/04/2025 0204   HGBUR NEGATIVE 01/04/2025 0204   BILIRUBINUR NEGATIVE 01/04/2025 0204   BILIRUBINUR Negative 04/28/2023 1616   KETONESUR 5 (A) 01/04/2025 0204   PROTEINUR NEGATIVE 01/04/2025 0204   UROBILINOGEN 0.2 03/27/2020 1138   NITRITE NEGATIVE 01/04/2025 0204   LEUKOCYTESUR NEGATIVE 01/04/2025 0204   Sepsis Labs Recent Labs  Lab 01/03/25 2039 01/04/25 0449 01/05/25 0509 01/06/25 0459  WBC 7.6 7.2 5.5 5.8   Microbiology No results found for this or any previous visit (from the past 240 hours).   Time coordinating discharge: 35 minutes  SIGNED:   Adron JONETTA Fairly, DO [facility] 01/06/2025, 5:46 PM  If 7PM-7AM, please contact night-coverage [email or web address]     [1]  Allergies Allergen Reactions   Sulfa Antibiotics Other (See  Comments)    Unknown reaction. Childhood reaction

## 2025-01-06 NOTE — Progress Notes (Signed)
 " PROGRESS NOTE    Luis Stewart  FMW:[id number] DOB: 02-21-1951 DOA: 01/03/2025 PCP: [name], MD   Brief Narrative:    Luis Stewart is a 74 y.o. male with medical history significant of multiple myeloma, hyperlipidemia, prostate cancer, history of hepatic and renal cysts, GERD, hyperlipidemia who presents to the emergency department due to nausea and vomiting which started on Sunday (1/11), this was associated with upper abdominal discomfort, decreased appetite with last good meal being on Saturday (1/10). He was recently admitted here from 12/09/2024 through 12/11/2024 due to retroperitoneal mass.  Oncology (Dr. Davonna) was consulted and referred patient to interventional radiology.  Unfortunately, IR felt that it was not safe to do the procedure based on the location and patient was referred to GI for EUS with biopsy at [facility], since patient is going there for [facility]T.  He was admitted for gastric outlet obstruction in the setting of multiple myeloma with history of retroperitoneal mass.  He was seen by GI and underwent EGD on 1/14 with biopsies taken of the duodenum and noted inflamed areas with strictures in the duodenal region.  He has now been advanced to full liquid diet, but has difficulty tolerating this and therefore general surgery contacted for surgical options.  Assessment & Plan:   Principal Problem:   Gastric outlet obstruction Active Problems:   Duodenal obstruction  Assessment and Plan:   Duodenal obstruction This was due to patient's history of retroperitoneal mass.  Patient was referred to GI at [facility] for EUS with biopsy.  Continue diet as ordered per GI, general surgery consulted for further evaluation Status post EGD 1/14 with biopsies taken and obstruction noted in the duodenal region Continue IV Protonix  40 mg daily Continue Zofran  p.r.n. for nausea/vomiting   Nausea and vomiting Continue Zofran  as needed   Acute  kidney injury-resolved Creatinine 1.37 (baseline creatinine 0.9-1.1) Resumed IV fluid due to poor oral intake Renally adjust medications, avoid nephrotoxic agents/dehydration/hypotension   Mixed hyperlipidemia Continue statin   GERD Continue Protonix    Multiple myeloma Prostate cancer Patient will continue outpatient follow-up with oncology    DVT prophylaxis: SCDs Code Status: Full Family Communication: Spouse at bedside 1/15 Disposition Plan:  Status is: Inpatient Remains inpatient appropriate because: Need for IV medications   Consultants:  GI General Surgery  Procedures:  EGD with biopsy 1/14  Antimicrobials:  None   Subjective: Patient seen and evaluated today and states that he had significant amounts of nausea and vomiting as well as abdominal pain with supper last night.  He was noted to have some further nausea this morning and states he is still having difficulty tolerating his diet.  He is passing flatus with no bowel movement.  Objective: Vitals:   01/05/25 0613 01/05/25 1340 01/05/25 2056 01/06/25 0542  BP: 126/84 (!) 144/91 (!) 142/84 113/84  Pulse: 80 88 97 95  Resp: 18 (!) 24 12 20   Temp: (!) 97.4 F (36.3 C) 97.8 F (36.6 C) (!) 97.5 F (36.4 C) 97.7 F (36.5 C)  TempSrc: Oral Oral Oral Oral  SpO2: 99% 100% 100% 99%  Weight:      Height:        Intake/Output Summary (Last 24 hours) at 01/06/2025 1231 Last data filed at 01/06/2025 0100 Gross per 24 hour  Intake 200 ml  Output 300 ml  Net -100 ml   Filed Weights   01/04/25 0117 01/04/25 1158  Weight: 72.4 kg 72.1 kg    Examination:  General  exam: Appears calm and comfortable  Respiratory system: Clear to auscultation. Respiratory effort normal. Cardiovascular system: S1 & S2 heard, RRR.  Gastrointestinal system: Abdomen is soft Central nervous system: Alert and awake Extremities: No edema Skin: No significant lesions noted Psychiatry: Flat affect.    Data Reviewed: I have  personally reviewed following labs and imaging studies  CBC: Recent Labs  Lab 01/03/25 2039 01/04/25 0449 01/05/25 0509 01/06/25 0459  WBC 7.6 7.2 5.5 5.8  HGB 11.9* 11.4* 11.3* 11.4*  HCT 36.0* 33.9* 35.4* 34.6*  MCV 96.3 95.0 98.1 95.6  PLT 229 175 197 208   Basic Metabolic Panel: Recent Labs  Lab 01/03/25 2039 01/04/25 0449 01/05/25 0509 01/06/25 0459  NA 138 140 141 141  K 4.6 5.1 4.8 4.1  CL 99 102 104 104  CO2 22 25 25 24   GLUCOSE 157* 118* 109* 113*  BUN 21 21 24* 24*  CREATININE 1.37* 1.19 1.21 1.13  CALCIUM  9.2 8.4* 8.1* 7.8*  MG  --  2.3 2.0 2.0  PHOS  --  3.4  --   --    GFR: Estimated Creatinine Clearance: 58.5 mL/min (by C-G formula based on SCr of 1.13 mg/dL). Liver Function Tests: Recent Labs  Lab 01/03/25 2039 01/04/25 0449 01/05/25 0509 01/06/25 0459  AST 15 13* 11* 12*  ALT 20 18 13 12   ALKPHOS 64 58 49 51  BILITOT 0.4 0.3 0.3 0.4  PROT 6.7 5.9* 5.4* 5.4*  ALBUMIN 3.6 3.2* 2.9* 3.2*   Recent Labs  Lab 01/03/25 2039  LIPASE 15   No results for input(s): AMMONIA in the last 168 hours. Coagulation Profile: No results for input(s): INR, PROTIME in the last 168 hours. Cardiac Enzymes: No results for input(s): CKTOTAL, CKMB, CKMBINDEX, TROPONINI in the last 168 hours. BNP (last 3 results) No results for input(s): PROBNP in the last 8760 hours. HbA1C: No results for input(s): HGBA1C in the last 72 hours. CBG: No results for input(s): GLUCAP in the last 168 hours. Lipid Profile: No results for input(s): CHOL, HDL, LDLCALC, TRIG, CHOLHDL, LDLDIRECT in the last 72 hours. Thyroid  Function Tests: No results for input(s): TSH, T4TOTAL, FREET4, T3FREE, THYROIDAB in the last 72 hours. Anemia Panel: No results for input(s): VITAMINB12, FOLATE, FERRITIN, TIBC, IRON, RETICCTPCT in the last 72 hours. Sepsis Labs: No results for input(s): PROCALCITON, LATICACIDVEN in the last 168  hours.  No results found for this or any previous visit (from the past 240 hours).       Radiology Studies: No results found.       Scheduled Meds:  acyclovir   400 mg Oral BID   cycloSPORINE   1 drop Both Eyes BID   DULoxetine   60 mg Oral Daily   feeding supplement  1 Container Oral TID BM   multivitamin  15 mL Oral Daily   pantoprazole  (PROTONIX ) IV  40 mg Intravenous Q24H   rosuvastatin   5 mg Oral Daily     LOS: 2 days    Time spent: 55 minutes    Kiylee Thoreson JONETTA Fairly, DO [facility]  If 7PM-7AM, please contact night-coverage [email or web address] 01/06/2025, 12:31 PM   "

## 2025-01-06 NOTE — H&P (Signed)
 BMT Admission History & Physical  Chief Complaint: Nausea, Vomiting  Review of Systems Pertinent items are noted in HPI.  History of Present Illness: Luis Stewart is a 74 year old male with known history of  of IgG Kappa Multiple Myeloma, metastatic prostate cancer (bone mets, current treatment with ADT), HLD, GERD, and recent diagnosis of retroperitoneal mass with obstruction of stomach and duodenal bulb (pending biopsy) who presents as transfer from OSH for further management of gastric outlet obstruction.   Per chart review, patient presented to OSH ED on 01/04/25 with complaint of nausea and vomiting x 4 days prior to presentation. Lab work notable for creatinine of 1.37. CT ab/pelvis obtained which revealed mechanical gastric proximal duodenal obstruction at the 2nd and 3rd portions of the duodenum. He was given zofran  and IV fluids. He was admitted to OSH for further management of gastric outlet obstruction. While admitted,  GI consulted and he underwent EGD on 01/04/25 which revealed extrinsic compression of duodenum with markedly edematous mucosa. Biopsy of the duodenum and gastric antrum were taken. Post procedure he was started on a clear liquid diet and then a full liquid diet. He had difficulty tolerating advanced diet, so general surgery was consulted. Given patient receives cancer care at [facility], he was transferred for further management of gastric outlet obstruction.   Oncology History  Multiple myeloma (CMD)  04/09/2022 Initial Diagnosis   Multiple myeloma (HCC)   11/28/2022 - 12/02/2022 Chemotherapy   OP ONC Supportive Filgrastim  (ZARXIO ) / Plerixafor  (MOZOBIL ) for mobilization Plan Provider: Norleen Debby Funk, DO Treatment goal: Supportive Line of treatment: Transplant   01/08/2023 - 01/12/2023 Chemotherapy   OP ONC Supportive Filgrastim  (Zarxio ) / Plerixafor  (MOZOBIL ) for mobilization Plan Provider: Norleen Debby Funk, DO Treatment goal: Control Line of treatment:  Transplant   10/10/2024 -  Supportive Treatment   Adult Peripheral IV and Central Venous Access Orders & Alteplase  PRN Plan Provider: Norleen Debby Funk, MD   11/09/2024 - 11/09/2024 Supportive Treatment   BMT Apheresis Un-Mobilized Collection Orders Plan Provider: Norleen Debby Funk, MD   11/24/2024 -  Supportive Treatment   BMT Apheresis Un-Mobilized Collection Orders Plan Provider: Norleen Debby Funk, MD   01/10/2025 -  Oncology Treatment   Protocol TCT - WF Carvykti - Lymphodepleting Fludarabine / Cyclophosphamide for CAR-T Ciltacabtagene Autoleucel (Multiple Myeloma)  Chemotherapy/Immunotherapy Medications ciltacabtagene autoleucel (CARVYKTI) infusion 1 Dose, 1 Dose, intravenous fludarabine (FLUDARA) 63.75 mg in sodium chloride  0.9 % 102.6 mL chemo IVPB, 30 mg/m2 = 63.75 mg, intravenous cycloPHOSphamide (CYTOXAN) 636 mg in sodium chloride  0.9 % 281.8 mL chemo IVPB, 300 mg/m2 = 636 mg, intravenous  [Plan is still active]     Medical History[1]  Surgical History[2]  Family History[3]  Social History   Socioeconomic History   Marital status: Married    Spouse name: Not on file   Number of children: Not on file   Years of education: Not on file   Highest education level: Not on file  Occupational History   Not on file  Tobacco Use   Smoking status: Never   Smokeless tobacco: Never  Substance and Sexual Activity   Alcohol use: Not Currently   Drug use: Never   Sexual activity: Not Currently  Other Topics Concern   Not on file  Social History Narrative   Not on file   Social Drivers of Health   Living Situation: Low Risk (01/06/2025)   Living Situation    What is your living situation today?: I have a steady place to  live    Think about the place you live. Do you have problems with any of the following? Choose all that apply:: None/None on this list  Food Insecurity: Low Risk (01/06/2025)   Food vital sign    Within the past 12 months, you worried  that your food would run out before you got money to buy more: Never true    Within the past 12 months, the food you bought just didn't last and you didn't have money to get more: Never true  Transportation Needs: No Transportation Needs (01/06/2025)   Transportation    In the past 12 months, has lack of reliable transportation kept you from medical appointments, meetings, work or from getting things needed for daily living? : No  Utilities: Low Risk (01/06/2025)   Utilities    In the past 12 months has the electric, gas, oil, or water  company threatened to shut off services in your home? : No  Safety: Low Risk (01/06/2025)   Safety    How often does anyone, including family and friends, physically hurt you?: Never    How often does anyone, including family and friends, insult or talk down to you?: Never    How often does anyone, including family and friends, threaten you with harm?: Never    How often does anyone, including family and friends, scream or curse at you?: Never  Alcohol Screening: Not on file  Tobacco Use: Low Risk (01/04/2025)   Received from [facility]   Patient History    Smoking Tobacco Use: Never    Smokeless Tobacco Use: Never    Passive Exposure: Not on file  Depression: Not At Risk (12/29/2024)   PHQ-2    PHQ-2 Score: 0  Social Connections: Unknown (01/06/2025)   Social Connection and Isolation Panel    Frequency of Communication with Friends and Family: Not on file    Frequency of Social Gatherings with Friends and Family: Not on file    Attends Religious Services: Not on file    Active Member of Clubs or Organizations: Not on file    Attends Banker Meetings: Not on file    Marital Status: Married  Physicist, Medical Strain: Low Risk (01/06/2025)   Overall Financial Resource Strain (CARDIA)    Difficulty of Paying Living Expenses: Not hard at all    Social Drivers of Health   Living Situation: Low Risk (01/06/2025)   Living  Situation    What is your living situation today?: I have a steady place to live    Think about the place you live. Do you have problems with any of the following? Choose all that apply:: None/None on this list  Food Insecurity: Low Risk (01/06/2025)   Food vital sign    Within the past 12 months, you worried that your food would run out before you got money to buy more: Never true    Within the past 12 months, the food you bought just didn't last and you didn't have money to get more: Never true  Transportation Needs: No Transportation Needs (01/06/2025)   Transportation    In the past 12 months, has lack of reliable transportation kept you from medical appointments, meetings, work or from getting things needed for daily living? : No  Utilities: Low Risk (01/06/2025)   Utilities    In the past 12 months has the electric, gas, oil, or water  company threatened to shut off services in your home? : No  Alcohol Screening: Not on  file  Tobacco Use: Low Risk (01/04/2025)   Received from [facility]   Patient History    Smoking Tobacco Use: Never    Smokeless Tobacco Use: Never    Passive Exposure: Not on file  Depression: Not At Risk (12/29/2024)   PHQ-2    PHQ-2 Score: 0    Allergies[4]  Prior to Admission medications  Medication  acetaminophen  (TYLENOL ) 500 mg tablet  acyclovir  (ZOVIRAX ) 400 mg tablet  cholecalciferol (VITAMIN D3) 1,000 unit (25 mcg) tablet  DULoxetine  (CYMBALTA ) 60 mg capsule  ferrous sulfate 325 mg (65 mg iron) EC tablet  lenalidomide  (REVLIMID ) 15 mg cap  multivitamin (Flintstones/Extra C) chewable tablet  nitroglycerin  (NITROSTAT ) 0.4 mg SL tablet  pantoprazole  (PROTONIX ) 40 mg EC tablet  potassium chloride  (KLOR-CON ) 10 mEq ER tablet  prochlorperazine  (COMPAZINE ) 10 mg tablet  Restasis  0.05 % ophthalmic emulsion  rosuvastatin  (CRESTOR ) 5 mg tablet    Objective:  Physical Exam BP 103/78 (BP Location: Left arm, Patient Position: Lying)   Pulse (!)  121   Temp 98.1 F (36.7 C) (Axillary)   Resp 18   Ht 1.88 m (6' 2)   Wt 67.7 kg (149 lb 3.2 oz)   SpO2 99%   BMI 19.16 kg/m  General: Chronically ill appearing, cachetic male in no acute distress.  Accompanied by wife.   Head-Eyes-Ears-Nose-Throat: No scleral icterus or injection. Nares and oropharynx are clear without erythema or exudate.  Mucous membranes dry but clear. NGT in place.    Cardiovascular: Regular rate and regular rhythm without murmurs, rubs or gallops.  Chest/Lungs: Clear to auscultation throughout without wheezes, rhonchi or crackles.  No cough observed.  Abdomen: Abdomen is soft, mild tenderness to deep palpation RUQ but nondistended.  No hepatosplenomegaly appreciated. No rebound, rigidity or guarding. No evidence of an acute abdomen  Extremities: Peripheral pulses intact without edema.  Skin: Warm, dry, intact without rash. Lower extremities are cool to touch  Neuro/Psych: Alert and oriented x 3 and fully ambulatory.  Pleasant appropriate affect and normal thought content.   Venous Access: Port L chest wall.     Labs: Recent Results (from the past 48 hours)  Comprehensive Metabolic Panel   Collection Time: 01/06/25  8:43 PM  Result Value Ref Range   Sodium 136 136 - 145 mmol/L   Potassium 4.8 3.5 - 5.1 mmol/L   Chloride 101 98 - 107 mmol/L   CO2 20 (L) 21 - 31 mmol/L   Anion Gap 15 (H) 6 - 14 mmol/L   Glucose, Random 171 (H) 70 - 99 mg/dL   Blood Urea  Nitrogen (BUN) 27 (H) 7 - 25 mg/dL   Creatinine 8.36 (H) 9.29 - 1.30 mg/dL   eGFR 44 (L) >40 fO/fpw/8.26f7   Albumin 3.5 3.5 - 5.7 g/dL   Total Protein 5.6 (L) 6.4 - 8.9 g/dL   Bilirubin, Total 0.6 0.3 - 1.0 mg/dL   Alkaline Phosphatase (ALP) 50 34 - 104 U/L   Aspartate Aminotransferase (AST) 19 13 - 39 U/L   Alanine Aminotransferase (ALT) 9 7 - 52 U/L   Calcium  7.9 (L) 8.6 - 10.3 mg/dL   BUN/Creatinine Ratio 16.6 10.0 - 20.0  Magnesium    Collection Time: 01/06/25  8:43 PM  Result Value Ref Range    Magnesium  1.9 1.9 - 2.7 mg/dL  Phosphorus   Collection Time: 01/06/25  8:43 PM  Result Value Ref Range   Phosphorus 3.8 2.5 - 5.0 mg/dL  Immunoglobulins, Quantitative, IgA, IgG, IgM   Collection Time: 01/06/25  8:43 PM  Result Value Ref Range   Immunoglobulin A, Quantitative (IgA) 33 (L) 66 - 433 mg/dL   Immunoglobulin G, Quantitative (IgG) 363 (L) 635 - 1,741 mg/dL   Immunoglobulin M, Quantitative (IgM) 21 (L) 45 - 281 mg/dL  Free Kappa And Lambda Light Chains with Ratio, Quantitative   Collection Time: 01/06/25  8:43 PM  Result Value Ref Range   Free Kappa Light Chains, Serum 11.44 3.30 - 19.40 mg/L   Free Lambda Light Chains, Serum 6.01 5.71 - 26.30 mg/L   Kappa/Lambda Ratio, Serum 1.90 (H) 0.26 - 1.65  PSA, Total   Collection Time: 01/06/25  8:43 PM  Result Value Ref Range   PSA, Total 0.26 0.00 - 6.50 ng/mL  Lactate Dehydrogenase (LDH)   Collection Time: 01/06/25  8:43 PM  Result Value Ref Range   Lactate Dehydrogenase (LDH) 393 (H) 140 - 271 U/L  Uric Acid   Collection Time: 01/06/25  8:43 PM  Result Value Ref Range   Uric Acid 7.5 4.4 - 7.6 mg/dL  CBC with Differential   Collection Time: 01/06/25  8:43 PM  Result Value Ref Range   WBC 6.50 4.40 - 11.00 10*3/uL   RBC 4.52 4.50 - 5.90 10*6/uL   Hemoglobin 14.4 14.0 - 17.5 g/dL   Hematocrit 55.1 58.4 - 50.4 %   Mean Corpuscular Volume (MCV) 99.1 (H) 80.0 - 96.0 fL   Mean Corpuscular Hemoglobin (MCH) 31.9 27.5 - 33.2 pg   Mean Corpuscular Hemoglobin Conc (MCHC) 32.2 (L) 33.0 - 37.0 g/dL   Red Cell Distribution Width (RDW) 18.6 (H) 12.3 - 17.0 %   Platelet Count (PLT) 185 150 - 450 10*3/uL   Mean Platelet Volume (MPV) 10.1 6.8 - 10.2 fL   Neutrophils % 88 %   Lymphocytes % 1 %   Monocytes % 10 %   Eosinophils % 0 %   Basophils % 0 %   nRBC % 0 %   Neutrophils Absolute 5.70 1.80 - 7.80 10*3/uL   Lymphocytes # 0.10 (L) 1.00 - 4.80 10*3/uL   Monocytes # 0.70 0.00 - 0.80 10*3/uL   Eosinophils # 0.00 0.00 - 0.50  10*3/uL   Basophils # 0.00 0.00 - 0.20 10*3/uL   nRBC Absolute 0.00 <=0.00 10*3/uL    Radiology Results (last 72 hours)     Procedure Component Value Units Date/Time   CT Abdomen Pelvis W IV Only [[id number]] Resulted: 01/03/25 2356   Order Status: Completed Updated: 01/04/25 1408   Narrative:     EXAM:CT ABDOMEN AND PELVIS WITH CONTRAST01/13/2026 11:29:28 PMTECHNIQUE:CT of the abdomen and pelvis was performed with the administration ofintravenous contrast. Multiplanar reformatted images are provided for review.Automated exposure control, iterative reconstruction, and/or weight-basedadjustment of the mA/kV was utilized to reduce the radiation dose to as low asreasonably achievable. 100 mL of iohexol  (OMNIPAQUE ) 300 MG/ML solution wasadministered.COMPARISON:12/19/2025CLINICAL HISTORY:Bowel obstruction. Nausea, abdominal pain, vomiting.FINDINGS:LOWER CHEST:Bilateral posterior basal consolidation and superimposed atelectasis suggestiveof subacute infection or aspiration.LIVER:Innumerable simple cysts are noted throughout the liver. No enhancingintrahepatic mass. No intra or extrahepatic biliary ductal dilation.GALLBLADDER AND BILE DUCTS:Gallbladder is unremarkable. No biliary ductal dilatation.SPLEEN:No acute abnormality.PANCREAS:No acute abnormality.ADRENAL GLANDS:No acute abnormality.KIDNEYS, URETERS AND BLADDER:A simple cortical cyst is present within the right kidney, for which nofollow-up imaging is recommended. No stones in the kidneys or ureters. Nohydronephrosis. No perinephric or periureteral stranding. The bladder is notdistended.GI AND BOWEL:Duodenal malrotation is present. The stomach and proximal duodenum arefluid-filled and mildly dilated, similar to prior examination, with an abrupttransition at the juncture of the  second and third portion of the duodenumwhere this appears encased by an infiltrative process described below.Mechanical obstruction secondary to the infiltrative process is  suspected. Thestomach, small bowel, and large bowel are otherwise unremarkable. The appendixis normal.PERITONEUM AND RETROPERITONEUM:An infiltrative soft tissue process encasing the right renal vasculature andextending from the interval region of the right kidney to encase the junctureof the second and third portion of the duodenum is again identified and isgrossly unchanged, measuring roughly 5.5 x 5.7 cm (volume 89.9 cm3, calculatedas 5.5 x 5.7 x 5.7 x 0.52). Differential considerations should includeneoplastic processes such as lymphoma or inflammatory conditions such as IgG4related disease or sarcoidosis. No ascites. No free air.VASCULATURE:Aorta is normal in caliber. Mild aortoiliac atherosclerotic calcification. Noaortic aneurysm.LYMPH NODES:No lymphadenopathy.REPRODUCTIVE ORGANS:The prostate gland is massively enlarged. A 60 mm hypodense nodule within theright peripheral zone is nonspecific; however, a focal neoplasm is not excludedand correlation with serum PSA level is recommended. Prostate MRI examinationwould also be helpful for further evaluation.BONES AND SOFT TISSUES:Changes of Paget's disease of the bone are noted involving the sacrum . Similarchanges are noted within the left pubic symphysis. No suspicious lytic orblastic bone lesions are seen. No focal soft tissue abnormality.IMPRESSION:1. Mechanical gastric and proximal duodenal obstruction at the junction of thesecond and third portions of the duodenum, related to the infiltrative processdescribed below.2. Infiltrative soft tissue encasing the right renal vasculature and extendingto the duodenum, grossly unchanged, measuring roughly 5.5 x 5.7 cm, withdifferential considerations including lymphoma and inflammatory conditions suchas IgG4-related disease or sarcoidosis. Correlation with serum laboratoryexamination including total IgG level may be helpful for further evaluation3. Bilateral posterior basal consolidation and superimposed  atelectasis,suggestive of subacute infection or aspiration. 4. Massive prostatomegaly with a 16 mm hypodense nodule in the right peripheralzone, with focal neoplasm not excluded, with PSA evaluation and prostate MRIsuggested for further assessment.5. Duodenal malrotation.6. Innumerable simple hepatic cysts in keeping with polycystic liver disease .7. 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 Raf score includes aortic atherosclerosis (ICD10-I70.0).Electronically signed by: Dorethia Molt MD 01/03/2025 11:56 PM EST RPWorkstation: [id number]       Review of Ancillary Data: I have obtained additional historical information from a family member or outside physician. 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 Retroperitoneal mass initially incidentally found on CT ab/pelvis which was obtained on 12/09/24 for abdominal pain and diarrhea. He was seen by IR at [facility] on 12/26/24 for image guided biopsy however there was no safe percutaneous window identified so biopsy was not attempted. Referral was made at [facility] for EGD/EUS with biopsy which was scheduled for 01/12/25. 01/16 - Admit as transfer from [facility]. Consult surgical oncology for biopsy and gastric outlet obstruction management. NPO. PRN zofran  and compazine  for nausea/vomiting. F/u gastric and duodenal biopsies taken at [facility] on 12/25/24. Send LDH and uric acid.  -Consider consults in AM to both GI and surg-onc for evaluation  IgG Kappa MM current treatment with Kpd as bridging therapy to CAR-T (carvykti) Patient follows locally  at [facility] with Dr. Davonna and was referred to Dr. Fernande for CAR-T evaluation. Last seen 12/29/24 with plans for CAR-T at the end of January 01/16 - Send myeloma serologies including  immunoglobulins, free light chains, SPEP, and IFEX.   Metastatic Prostate Cancer Patient follows with [facility]. Current treatment with Eligard  (ADT).  01/16 - Repeat PSA.   Disease & Chemotherapy Induced Pancytopenia  Monitor counts daily.  Transfuse PRN per protocol.  FEN NS at 50cc/hr while NPO.  Electrolytes per protocol.  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  VTE SCDs while in bed.  GI PPI IV in place of home prilosec.  Chronic Medical Conditions  HLD Continue home statin when able to take PO  Ethics Full code.   I saw and evaluated the patient and reviewed Dr. Gaylen note. I agree with the findings and plan outlined above.    74 year old male patient with multiple myeloma undergoing evaluation for CAR-T cell therapy, who first presented to the ED on 12/09/2024 with left-sided abdominal pain and diarrhea.  CT A/P showed a 5.7 x 5.4 cm mass involving the 2nd/3rd portion of duodenum resulting in upstream obstruction of the stomach and duodenal bulb.  Mass extends to the hilar region of the right kidney.  He was scheduled for biopsy of the mass with IR, but this was not performed as a safe percutaneous window was not identified.  He presented again to the ED on 01/03/2025 with persistent nausea, vomiting, decreased appetite, and significant weight loss.  Repeat CT scans showed that the mass was grossly unchanged, causing mechanical gastric and duodenal obstruction.  He underwent an EGD and biopsies from the mass which came back benign.  He was transferred here for further management.  The etiology of this mass remains unclear.  We will obtain an IgG4 level, since IgG4 disease is in the differential.  We will request surgical oncology to evaluate the patient to obtain a  surgical biopsy as well as management of bowel obstruction.  Will discuss possible TPN with the surgical team, since he has lost significant weight and has minimal p.o. intake due to bowel obstruction at present.      [1] Past Medical History: Diagnosis Date   Arthritis    Cancer    (CMD) 02/26/2022   Multiple Myeloma   GERD (gastroesophageal reflux disease) 10/15/2017   Last Assessment & Plan:  Formatting of this note might be different from the original. On Pantoprazole  40 mg QOD   Multiple myeloma (CMD) 04/09/2022   Neuromuscular disorder (CMD)    Prostate cancer    (CMD) 08/17/2015   Last Assessment & Plan:  Formatting of this note might be different from the original. Has had prostate biopsy Elevated PSA Follows up with Urologist - under PSA surveillance Did not tolerate Finasteride  Referred to Oncology for concern of pelvic mass   Pulmonary nodule 07/19/2017   Vitamin D  deficiency 06/05/2014   Last Assessment & Plan:  Formatting of this note might be different from the original. ERGOCALCIFEROL   X 12 WEEKS  [2] Past Surgical History: Procedure Laterality Date   COLONOSCOPY  10/05/2023   [facility]  03/2022   UPPER GASTROINTESTINAL ENDOSCOPY  2018   2018  [3] No family history on file. [4] Allergies Allergen Reactions   Sulfa (Sulfonamide Antibiotics) Other (See Comments)    Unknown reaction. Childhood reaction  [5] Patient Active Problem List Diagnosis   GERD (gastroesophageal reflux disease)   Prostate cancer    (CMD)   Pulmonary nodule   Multiple myeloma (CMD)   Vitamin D  deficiency   Pre-transplant evaluation for stem cell transplant   Autologous donor of stem cells   Benign  prostatic hyperplasia with urinary frequency   Idiopathic peripheral neuropathy   Encounter for antineoplastic immunotherapy   Exposure to asbestos   History of DVT (deep vein thrombosis)   History of pulmonary embolism   Gastric outlet obstruction (CMD)

## 2025-01-06 NOTE — Care Management Important Message (Signed)
 Important Message  Patient Details  Name: Luis Stewart MRN: [id number] Date of Birth: 10/23/1951   Important Message Given:  Yes - Medicare IM     [name] 01/06/2025, 5:05 PM

## 2025-01-06 NOTE — Progress Notes (Cosign Needed)
 "  Gastroenterology Progress Note   Referring Provider: No ref. provider found Primary Care Physician:  [name], MD Primary Gastroenterologist:  [name].Rourk, MD   Patient ID: Luis Stewart; [id number]; September 09, 1951    Subjective   Doing okay without abdominal pain. + flatus but no BM since Tuesday. Tolerating liquids okay this morning. Tried some grits for breakfast and was fearful but thus far stayed down. Was not comfortable with trying the pureed eggs or sausage from his dysphagia 2 diet. He reports that last evening he had multiple episodes of vomiting.   Wife reports he did not tolerate compazine  well and felt like this set him back last night.   Objective   Vital signs in last 24 hours Temp:  [97.5 F (36.4 C)-97.8 F (36.6 C)] 97.7 F (36.5 C) (01/16 0542) Pulse Rate:  [88-97] 95 (01/16 0542) Resp:  [12-24] 20 (01/16 0542) BP: (113-144)/(84-91) 113/84 (01/16 0542) SpO2:  [99 %-100 %] 99 % (01/16 0542) Last BM Date : 01/04/25  Physical Exam General:   Alert and oriented, pleasant, NAD Head:  Normocephalic and atraumatic. Eyes:  No icterus, sclera clear. Conjuctiva pink.  Abdomen:  Bowel sounds present, soft, non-distended. Mild ttp to epigastrium today. No HSM or hernias noted. No rebound or guarding. No masses appreciated  Extremities:  Without clubbing or edema. Neurologic:  Alert and  oriented x4;  grossly normal neurologically. Skin:  Warm and dry, intact without significant lesions.  Psych:  Alert and cooperative. Normal mood and affect.  Intake/Output from previous day: 01/15 0701 - 01/16 0700 In: 200 [P.O.:200] Out: 300 [Urine:300] Intake/Output this shift: No intake/output data recorded.  Lab Results  Recent Labs    01/04/25 0449 01/05/25 0509 01/06/25 0459  WBC 7.2 5.5 5.8  HGB 11.4* 11.3* 11.4*  HCT 33.9* 35.4* 34.6*  PLT 175 197 208   BMET Recent Labs    01/04/25 0449 01/05/25 0509 01/06/25 0459  NA 140 141 141  K  5.1 4.8 4.1  CL 102 104 104  CO2 25 25 24   GLUCOSE 118* 109* 113*  BUN 21 24* 24*  CREATININE 1.19 1.21 1.13  CALCIUM  8.4* 8.1* 7.8*   LFT Recent Labs    01/04/25 0449 01/05/25 0509 01/06/25 0459  PROT 5.9* 5.4* 5.4*  ALBUMIN 3.2* 2.9* 3.2*  AST 13* 11* 12*  ALT 18 13 12   ALKPHOS 58 49 51  BILITOT 0.3 0.3 0.4   PT/INR No results for input(s): LABPROT, INR in the last 72 hours. Hepatitis Panel No results for input(s): HEPBSAG, HCVAB, HEPAIGM, HEPBIGM in the last 72 hours.  Studies/Results CT ABDOMEN PELVIS W CONTRAST Result Date: 01/03/2025 EXAM: CT ABDOMEN AND PELVIS WITH CONTRAST 01/03/2025 11:29:28 PM TECHNIQUE: CT of the abdomen and pelvis was performed with the administration of intravenous contrast. Multiplanar reformatted images are provided for review. Automated exposure control, iterative reconstruction, and/or weight-based adjustment of the mA/kV was utilized to reduce the radiation dose to as low as reasonably achievable. 100 mL of iohexol  (OMNIPAQUE ) 300 MG/ML solution was administered. COMPARISON: 12/09/2024 CLINICAL HISTORY: Bowel obstruction. Nausea, abdominal pain, vomiting. FINDINGS: LOWER CHEST: Bilateral posterior basal consolidation and superimposed atelectasis suggestive of subacute infection or aspiration. LIVER: Innumerable simple cysts are noted throughout the liver. No enhancing intrahepatic mass. No intra or extrahepatic biliary ductal dilation. GALLBLADDER AND BILE DUCTS: Gallbladder is unremarkable. No biliary ductal dilatation. SPLEEN: No acute abnormality. PANCREAS: No acute abnormality. ADRENAL GLANDS: No acute abnormality. KIDNEYS, URETERS AND BLADDER: A simple cortical  cyst is present within the right kidney, for which no follow-up imaging is recommended. No stones in the kidneys or ureters. No hydronephrosis. No perinephric or periureteral stranding. The bladder is not distended. GI AND BOWEL: Duodenal malrotation is present. The stomach and  proximal duodenum are fluid-filled and mildly dilated, similar to prior examination, with an abrupt transition at the juncture of the second and third portion of the duodenum where this appears encased by an infiltrative process described below. Mechanical obstruction secondary to the infiltrative process is suspected. The stomach, small bowel, and large bowel are otherwise unremarkable. The appendix is normal. PERITONEUM AND RETROPERITONEUM: An infiltrative soft tissue process encasing the right renal vasculature and extending from the interval region of the right kidney to encase the juncture of the second and third portion of the duodenum is again identified and is grossly unchanged, measuring roughly 5.5 x 5.7 cm (volume 89.9 cm3, calculated as 5.5 x 5.7 x 5.7 x 0.52). Differential considerations should include neoplastic processes such as lymphoma or inflammatory conditions such as IgG4 related disease or sarcoidosis. No ascites. No free air. VASCULATURE: Aorta is normal in caliber. Mild aortoiliac atherosclerotic calcification. No aortic aneurysm. LYMPH NODES: No lymphadenopathy. REPRODUCTIVE ORGANS: The prostate gland is massively enlarged. A 60 mm hypodense nodule within the right peripheral zone is nonspecific; however, a focal neoplasm is not excluded and correlation with serum PSA level is recommended. Prostate MRI examination would also be helpful for further evaluation. 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 Similar changes are noted within the left pubic symphysis. No suspicious lytic or blastic bone lesions are seen. No focal soft tissue abnormality. IMPRESSION: 1. Mechanical gastric and proximal duodenal obstruction at the junction of the second and third portions of the duodenum, related to the infiltrative process described below. 2. Infiltrative soft tissue encasing the right renal vasculature and extending to the duodenum, grossly  unchanged, measuring roughly 5.5 x 5.7 cm, with differential considerations including lymphoma and inflammatory conditions such as IgG4-related disease or sarcoidosis. Correlation with serum laboratory examination including total IgG level may be helpful for further evaluation 3. Bilateral posterior basal consolidation and superimposed atelectasis, suggestive of subacute infection or aspiration. 4. Massive prostatomegaly with a 16 mm hypodense nodule in the right peripheral zone, with focal neoplasm not excluded, with PSA evaluation and prostate MRI suggested for further assessment. 5. Duodenal malrotation. 6. 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 7. 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 Raf score includes aortic atherosclerosis (ICD10-I70.0). Electronically signed by: Dorethia Molt MD 01/03/2025 11:56 PM EST RP Workstation: [id number]   CT ABDOMEN LIMITED W CONTRAST Result Date: 12/26/2024 CLINICAL DATA:  74 year old with history of myeloma and abnormal soft tissue in the right retroperitoneum. Patient presents for image guided biopsy. EXAM: CT ABDOMEN LIMITED WITH CONTRAST TECHNIQUE: Patient was placed prone on the CT scanner. Non contrast images were obtained of the abdomen. The right retroperitoneal soft tissue was difficult to characterize. Therefore, additional CT images were obtained following administration of intravenous contrast. RADIATION DOSE REDUCTION: This exam was performed according to the departmental dose-optimization program which includes automated exposure control, adjustment of the mA and/or kV according to patient size and/or use of iterative reconstruction technique. CONTRAST:  75mL OMNIPAQUE  IOHEXOL  350 MG/ML SOLN COMPARISON:  12/09/2024 FINDINGS: Lower chest: Patchy densities at the lung bases could be related to atelectasis. Hepatobiliary: Innumerable hepatic cysts. Mild  distention of the gallbladder. There may be  wall thickening near the base of the gallbladder. Pancreas: Unremarkable. No pancreatic ductal dilatation or surrounding inflammatory changes. Spleen: Normal in size without focal abnormality. Adrenals/Urinary Tract: Adrenal glands are within normal limits. Low-density material in the right retroperitoneum surrounding the second and third portions of the duodenum and along the medial aspect the right kidney surrounding the right perihilar structures. This right retroperitoneal material was more dense or enhancing on the exam from 12/09/2024. Multiple right renal cysts. Normal appearance of the left kidney. No hydronephrosis. Stomach/Bowel: Again noted is abnormal soft tissue and stranding surrounding the second and third portions of the duodenum. Normal appearance of stomach without distension. No significant bowel dilatation. Vascular/Lymphatic: Abnormal stranding or soft tissue in the right retroperitoneum between the right kidney and duodenum. No significant lymph node enlargement in the visualized abdomen. Other: Negative for ascites. Musculoskeletal: No acute bone abnormality. IMPRESSION: 1. Persistent abnormal soft tissue or stranding in the right retroperitoneum situated between the duodenum and the right kidney. In addition, there is concern for wall thickening or abnormality at the base of the gallbladder. These findings remain nonspecific but cannot exclude a neoplastic process. 2. Patient was scheduled for image guided biopsy. Biopsy was not attempted because a safe percutaneous window was not identified due to the right perihilar vasculature and the right ureter. Consider GI consultation for endoscopy and EUS. Electronically Signed   By: Juliene Balder M.D.   On: 12/26/2024 15:07   Acute Abdominal Series Result Date: 12/11/2024 EXAM: UPRIGHT AND SUPINE XRAY VIEWS OF THE ABDOMEN AND 4 VIEW(S) OF THE CHEST 12/11/2024 12:42:07 AM COMPARISON: 12/10/2024 CLINICAL HISTORY: Small bowel obstruction (HCC)  FINDINGS: LUNGS AND PLEURA: Left basilar opacity. Probable small left pleural effusion. No consolidation or pulmonary edema. No pneumothorax. HEART AND MEDIASTINUM: Left chest port remains in place. No acute abnormality of the cardiac and mediastinal silhouettes. BOWEL: Scattered loops of large and small bowel are noted. No obstructive changes are noted. PERITONEUM AND SOFT TISSUES: No abnormal calcifications. No free air. BONES: No acute osseous abnormality. IMPRESSION: 1. No radiographic bowel obstruction or free air. 2. Left basilar opacity and probable small left pleural effusion. Electronically signed by: Oneil Devonshire MD 12/11/2024 12:54 AM EST RP Workstation: [id number]   DG Chest Portable 1 View Result Date: 12/10/2024 EXAM: 1 VIEW(S) XRAY OF THE CHEST 12/10/2024 01:59:51 AM COMPARISON: Comparison with 03/22/2024 and CT 09/05/2024. CLINICAL HISTORY: tube placement FINDINGS: LINES, TUBES AND DEVICES: Left chest wall port with the tip in the mid SVC. Subdiaphragmatic enteric tube. LUNGS AND PLEURA: Bibasilar atelectasis versus scarring. The lungs are otherwise clear. No pleural effusion. No pneumothorax. HEART AND MEDIASTINUM: No acute abnormality of the cardiac and mediastinal silhouettes. BONES AND SOFT TISSUES: No acute osseous abnormality. IMPRESSION: 1. Left chest wall port with the tip in the mid SVC and subdiaphragmatic enteric tube. 2. No acute findings. Electronically signed by: Norman Gatlin MD 12/10/2024 02:05 AM EST RP Workstation: [id number]   CT ABDOMEN PELVIS W CONTRAST Result Date: 12/10/2024 CLINICAL DATA:  Left lower quadrant pain EXAM: CT ABDOMEN AND PELVIS WITH CONTRAST TECHNIQUE: Multidetector CT imaging of the abdomen and pelvis was performed using the standard protocol following bolus administration of intravenous contrast. RADIATION DOSE REDUCTION: This exam was performed according to the departmental dose-optimization program which includes automated exposure control, adjustment  of the mA and/or kV according to patient size and/or use of iterative reconstruction technique. CONTRAST:  OMNIPAQUE  IOHEXOL  300 MG/ML  SOLN COMPARISON:  PET CT 06/16/2024, CT abdomen pelvis 04/26/2022 FINDINGS: Lower chest: Lung bases demonstrate mild dependent atelectasis. Hepatobiliary: Numerous hepatic cysts and additional subcentimeter hypodensities too small to further characterize. No calcified gallstones or biliary dilatation. Pancreas: Unremarkable. No pancreatic ductal dilatation or surrounding inflammatory changes. Spleen: Normal in size without focal abnormality. Adrenals/Urinary Tract: Adrenal glands are normal. Kidneys show no hydronephrosis. Right renal cyst for which no imaging follow-up is recommended. The bladder is unremarkable. Stomach/Bowel: Prominent gastric distension with debris. Dilated duodenal bulb. Abnormal appearance of the second and proximal third portion of duodenum. Abnormal mass or masslike process in the right retroperitoneum, appears associated with the second and proximal third aspect of duodenum which appear abnormally thickened. Mass like area measures about 5.7 x 5.4 cm on series 3, image 39. It extends to the right renal hilar region on series 3, image 35 and appears to surround right renal hilar vessels. Vascular/Lymphatic: Aortic atherosclerosis. No aneurysm. No suspicious lymph nodes Reproductive: Markedly enlarged prostate with mass effect on the bladder Other: No ascites or free air. Musculoskeletal: Heterogenous sclerosis and lucency involving the pelvic bones is grossly stable compared with the prior exam and could reflect Paget's disease. IMPRESSION: 1. Abnormal mass or masslike process in the right retroperitoneum, this appears to involve the second and third portion of duodenum and results in upstream obstruction of the stomach and duodenal bulb. Surgical consultation is recommended. Mass like process extends to the hilar region of the right kidney and appears  to encase hilar vessels. Pancreas abuts the masslike process but does not appear to be the origin of the mass. 2. Markedly enlarged prostate with mass effect on the bladder. 3. Heterogenous sclerosis and lucency involving the pelvic bones is grossly stable compared with the prior exam and could reflect Paget's disease. 4. Aortic atherosclerosis. Aortic Atherosclerosis (ICD10-I70.0). Electronically Signed   By: Luke Bun M.D.   On: 12/10/2024 00:17   Assessment  74 y.o. male with a history of multiple myeloma, prostate cancer, prior admission for SBO secondary to retroperitoneal mass surrounding the duodenum who presented with nausea and vomiting.  GI consulted for further evaluation.  Gastric and duodenal obstruction: - PET scan June 2025 with hypermetabolism corresponding to the right abdominal wall interstitial thickening area - CT guided biopsy attempted earlier this month however unable to perform - CT A/P this admission with mechanical gastric and proximal duodenal obstruction at the junction of the 2nd and 3rd portions of the duodenum, related to infiltrative process (lymphoma/inflammatory condition like IgG4 related disease or sarcoidosis), duodenal malrotation. - hs history of multiple myeloma  EGD 01/04/2025: -Normal esophagus -Dilated and inflamed appearing stomach, possible secondary to stasis s/p biopsy -Dilated duodenum with marked narrowing inflammation/extrinsic compression involving segment of D2-D3 s/p biopsy -Findings of duodenal obstruction likely more secondary to extrinsic compression, biopsies may not be diagnostic - pathology pending  Pathology may ultimately be benign given this could be possible external compression therefore may ultimately need surgical evaluation. Discussed this patient and wife today at the bedside and given the suspicion of external compression they would like to go ahead and have conversation regarding options with surgery in the event that he has  ongoing issues tolerating liquids and p.o intake.   Plan / Recommendations  Continue PPI, can do BID PO on discharge.  Follow up biopsy result Full liquid diet Zofran  TID scheduled prior to meals  Consult general surgery per patient and family request given may ultimately need surgical intervention  Received an update from Dr. Mavis  and hospitalist.  Dr. Mavis spoke with patient's oncologist at [facility] who agreed and accepted the patient for transfer to [facility] to reattempt biopsy of the retroperitoneal mass which could be done at the same time as possible gastric bypass or gastrostomy tube placement.  Given the patient will be transferred they will likely be undergoing surgical evaluation, GI service will sign off at this time.  If he begins not tolerating a diet then we will recommend IV hydration and possible NG tube pending the severity and his ability of p.o. intake.   LOS: 2 days    01/06/2025, 10:56 AM   Charmaine Melia, MSN, FNP-BC, AGACNP-BC [facility]   "

## 2025-01-07 NOTE — Consults (Signed)
 New GI Consult Note  Consult From:Rakhee Vaidya, MD Consult For:  retroperitoneal mass biopsy  History Of Present Illness Luis Stewart is a 74 y.o. male with PMH IgG Kappa Multiple Myeloma, metastatic prostate cancer (bone mets, current treatment with ADT), HLD, GERD, and recent diagnosis of retroperitoneal mass admitted from [facility] on 1/16 with concern for malignant GOO.  Patient with 3 weeks of nausea and vomiting with PO intolerance. He reports vomiting about 1 hour after eating. Also with 10 lb weight loss during this time. Denies significant abdominal pain. He was admitted to OSH 1/14 for these symptoms. CT ab/pelvis obtained showed mechanical gastric and proximal duodenal obstruction at the junction of the second and third portions of the duodenum, related to the infiltrative process described below. Infiltrative soft tissue encasing the right renal vasculature and extending to the duodenum, grossly unchanged, measuring roughly 5.5 x 5.7 cm . He underwent EGD at OSH 1/14 with duodenal compression at D3 s/p biopsies that were negative for malignancy. Post procedure he was started on a clear liquid diet and then a full liquid diet. He had difficulty tolerating advanced diet, so general surgery was consulted. Given patient receives cancer care at [facility], he was transferred for further management of gastric outlet obstruction. Surg onc was consulted for management of GOO and recommended GI consult for biopsy of retroperitoneal mass.   OSH CT A/P 1/13: CT ab/pelvis obtained showed mechanical gastric and proximal duodenal obstruction at the junction of the second and third portions of the duodenum, related to the infiltrative process described below. Infiltrative soft tissue encasing the right renal vasculature and extending to the duodenum, grossly unchanged, measuring roughly 5.5 x 5.7 cm. Bilateral posterior basal consolidation and superimposed atelectasis, suggestive of subacute infection or  aspiration. Massive prostatomegaly with a 16 mm hypodense nodule in the right peripheral zone, with focal neoplasm not excluded, with PSA evaluation and prostate MRI suggested for further assessment. Duodenal malrotation. Innumerable simple hepatic cysts in keeping with polycystic liver disease. 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 Dilated, inflamed appearing stomach likely secondary to stasis. Status post biopsy. Dilated duodenum with marked narrowing inflammation/extrinsic compression involving a segment of D2/D3 status post biopsy. Findings of duodenal obstruction may be caused more by external component. Biopsies may not be  diagnostic. Palliative surgical bypass may be beneficial.   At [facility]: Patient's VSS, T bili 0.6, ALP 45, AST 9, ALT 8.   Patient denies unintentional weight loss, nausea, vomiting, dysphagia, early satiety, abdominal pain, hematemesis, hematochezia, dark tarry stools, diarrhea or constipation.      No NSAIDs or blood thinners.  The patient denies smoking, drinking or drug use.  No family history of GI malignancy or IBD.  No prior EGD or colonoscopy.      Past Medical History He has a past medical history of Arthritis, Cancer    (CMD) (02/26/2022), GERD (gastroesophageal reflux disease) (10/15/2017), Multiple myeloma (CMD) (04/09/2022), Neuromuscular disorder (CMD), Prostate cancer    (CMD) (08/17/2015), Pulmonary nodule (07/19/2017), and Vitamin D  deficiency (06/05/2014).  Surgical History He has a past surgical history that includes Upper gastrointestinal endoscopy (2018); Colonoscopy (10/05/2023); and Permacath (03/2022).   Social History He reports that he has never smoked. He has never used smokeless tobacco. He reports that he does not currently use alcohol. He reports that he does not use drugs.   Allergies Sulfa (sulfonamide antibiotics)  Medications Prescriptions Prior to  Admission[1]  Review of Systems A complete review of systems was otherwise negative, except as noted in the HPI.   Physical Exam Blood pressure 104/67, pulse 103, temperature 97.8 F (36.6 C), temperature source Oral, resp. rate 17, height 1.88 m (6' 2), weight 67.7 kg (149 lb 3.2 oz), SpO2 97%. General: cachectic HEENT: EOMI, oropharynx clear, no LAD Cardiovascular: No edema Pulmonary: Normal work of breathing Abdominal: Nontender, nondistended, soft  Neuro: No focal deficits, ANO x3   Assessment and Plan Briefly, Luis Stewart is a 74 y.o. male with PMH IgG Kappa Multiple Myeloma, metastatic prostate cancer (bone mets, current treatment with ADT), HLD, GERD, and recent diagnosis of retroperitoneal mass admitted from [facility] on 1/16 with concern for malignant GOO. GI consulted for consideration of retroperitoneal mass biopsy and GJ placement.  Patient with about 3 weeks of PO intolerance, nausea, and vomiting.   OSH CT A/P 1/13: CT ab/pelvis obtained showed mechanical gastric and proximal duodenal obstruction at the junction of the second and third portions of the duodenum, related to the infiltrative process described below. Infiltrative soft tissue encasing the right renal vasculature and extending to the duodenum, grossly unchanged, measuring roughly 5.5 x 5.7 cm. Bilateral posterior basal consolidation and superimposed atelectasis, suggestive of subacute infection or aspiration. Massive prostatomegaly with a 16 mm hypodense nodule in the right peripheral zone, with focal neoplasm not excluded, with PSA evaluation and prostate MRI suggested for further assessment. Duodenal malrotation. Innumerable simple hepatic cysts in keeping with polycystic liver disease. 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 Dilated, inflamed appearing stomach likely secondary to stasis. Status post biopsy. Dilated duodenum with  marked narrowing inflammation/extrinsic compression involving a segment of D2/D3 status post biopsy. Findings of duodenal obstruction may be caused more by external component. Biopsies may not be  diagnostic. Palliative surgical bypass may be beneficial.   Post procedure he was started on a clear liquid diet and then a full liquid diet. He had difficulty tolerating advanced diet, so general surgery was consulted. Given patient receives cancer care at [facility], he was transferred for further management of gastric outlet obstruction.  At [facility]: Patient's VSS, T bili 0.6, ALP 45, AST 9, ALT 8.   Retroperitoneal mass measuring roughly 5.5 x 5.7 cm, s/p EGD at OSH 1/14 with bx negative for malignancy  D2/D3 marked narrowing/extrinsic compression -Plan for ERCP/EUS biopsy of RP mass +/- stent 1/20 -Please make patient NPO at midnight 1/19  Patient was seen and discussed with GI attending Dr. Missie  GI will follow  Please page with any questions/concerns  Jovita Ores, MD, MSc Gastroenterology Fellow, PGY-4  I performed the service or was physically present during the critical or key portions of the service furnished by me; and he/she managed the patient. I spoke to the pt and his daughter. The pt is unable to keep PO. On exam he has no abdominal distention. There is concern of an extramucosal duodenal mass with potential extraluminal obstruction/ Will discuss with biliary faculty regarding EUS + FNA and possible enteric stenting.   Electronically signed by: Lonni Missie, MD 01/08/2025 10:50 PM   Lonni Missie, MD 01/08/2025        [1] Medications Prior to Admission  Medication Sig Dispense Refill Last Dose/Taking   acetaminophen  (TYLENOL ) 500 mg tablet Take 1,000 mg by mouth.      acyclovir  (ZOVIRAX ) 400 mg tablet Take 400 mg by mouth.  cholecalciferol (VITAMIN D3) 1,000 unit (25 mcg) tablet Take 1,000 Units by mouth daily.      DULoxetine  (CYMBALTA ) 60 mg capsule Take 60  mg by mouth daily.      ferrous sulfate 325 mg (65 mg iron) EC tablet Take 325 mg by mouth daily.      lenalidomide  (REVLIMID ) 15 mg cap Take 15 mg by mouth. (Patient not taking: Reported on 12/29/2024)      multivitamin (Flintstones/Extra C) chewable tablet Chew.      nitroglycerin  (NITROSTAT ) 0.4 mg SL tablet Place 0.4 mg under the tongue. (Patient not taking: Reported on 12/29/2024)      pantoprazole  (PROTONIX ) 40 mg EC tablet Take 40 mg by mouth.      potassium chloride  (KLOR-CON ) 10 mEq ER tablet Take 10 mEq by mouth daily.      prochlorperazine  (COMPAZINE ) 10 mg tablet Take 10 mg by mouth.      Restasis  0.05 % ophthalmic emulsion Administer 1 drop into both eyes 2 (two) times a day.      rosuvastatin  (CRESTOR ) 5 mg tablet Take 5 mg by mouth daily.

## 2025-01-07 NOTE — Progress Notes (Signed)
 ------------------------------------------------------------------------------- Attestation signed by Liborio Dolin, MD at 01/08/2025 11:15 PM I saw and evaluated the patient and reviewed Dr. Sharalyn note. I agree with the findings and plan outlined above.   See my attestation to admission H&P.  Liborio Dolin, MD 01/08/2025 11:14 PM   -------------------------------------------------------------------------------  BMT Admission History & Physical  Chief Complaint: Nausea, Vomiting  Review of Systems Pertinent items are noted in HPI.  History of Present Illness: Luis Stewart is a 74 year old male with known history of  of IgG Kappa Multiple Myeloma, metastatic prostate cancer (bone mets, current treatment with ADT), HLD, GERD, and recent diagnosis of retroperitoneal mass with obstruction of stomach and duodenal bulb (pending biopsy) who presents as transfer from OSH for further management of gastric outlet obstruction.   Per chart review, patient presented to OSH ED on 01/04/25 with complaint of nausea and vomiting x 4 days prior to presentation. Lab work notable for creatinine of 1.37. CT ab/pelvis obtained which revealed mechanical gastric proximal duodenal obstruction at the 2nd and 3rd portions of the duodenum. He was given zofran  and IV fluids. He was admitted to OSH for further management of gastric outlet obstruction. While admitted,  GI consulted and he underwent EGD on 01/04/25 which revealed extrinsic compression of duodenum with markedly edematous mucosa. Biopsy of the duodenum and gastric antrum were taken. Post procedure he was started on a clear liquid diet and then a full liquid diet. He had difficulty tolerating advanced diet, so general surgery was consulted. Given patient receives cancer care at [facility], he was transferred for further management of gastric outlet obstruction.   Subjective: Seen with family members at bedside. He currently endorses some occasional nausea,  NGT in place. States he was tolerating liquids at OSH but he has lost 10 pds over the course of this week and notes 30pd weight loss for x3 weeks.   Oncology History  Multiple myeloma (CMD)  04/09/2022 Initial Diagnosis   Multiple myeloma (HCC)   11/28/2022 - 12/02/2022 Chemotherapy   OP ONC Supportive Filgrastim  (ZARXIO ) / Plerixafor  (MOZOBIL ) for mobilization Plan Provider: Norleen Debby Funk, DO Treatment goal: Supportive Line of treatment: Transplant   01/08/2023 - 01/12/2023 Chemotherapy   OP ONC Supportive Filgrastim  (Zarxio ) / Plerixafor  (MOZOBIL ) for mobilization Plan Provider: Norleen Debby Funk, DO Treatment goal: Control Line of treatment: Transplant   10/10/2024 -  Supportive Treatment   Adult Peripheral IV and Central Venous Access Orders & Alteplase  PRN Plan Provider: Norleen Debby Funk, MD   11/09/2024 - 11/09/2024 Supportive Treatment   BMT Apheresis Un-Mobilized Collection Orders Plan Provider: Norleen Debby Funk, MD   11/24/2024 -  Supportive Treatment   BMT Apheresis Un-Mobilized Collection Orders Plan Provider: Norleen Debby Funk, MD   01/10/2025 -  Oncology Treatment   Protocol TCT - WF Carvykti - Lymphodepleting Fludarabine / Cyclophosphamide for CAR-T Ciltacabtagene Autoleucel (Multiple Myeloma)  Chemotherapy/Immunotherapy Medications ciltacabtagene autoleucel (CARVYKTI) infusion 1 Dose, 1 Dose, intravenous fludarabine (FLUDARA) 63.75 mg in sodium chloride  0.9 % 102.6 mL chemo IVPB, 30 mg/m2 = 63.75 mg, intravenous cycloPHOSphamide (CYTOXAN) 636 mg in sodium chloride  0.9 % 281.8 mL chemo IVPB, 300 mg/m2 = 636 mg, intravenous  [Plan is still active]     Medical History[1]  Surgical History[2]  Family History[3]  Social History   Socioeconomic History   Marital status: Married    Spouse name: Not on file   Number of children: Not on file   Years of education: Not on file   Highest education level: Not  on file  Occupational History   Not on  file  Tobacco Use   Smoking status: Never   Smokeless tobacco: Never  Substance and Sexual Activity   Alcohol use: Not Currently   Drug use: Never   Sexual activity: Not Currently  Other Topics Concern   Not on file  Social History Narrative   Not on file   Social Drivers of Health   Living Situation: Low Risk (01/06/2025)   Living Situation    What is your living situation today?: I have a steady place to live    Think about the place you live. Do you have problems with any of the following? Choose all that apply:: None/None on this list  Food Insecurity: Low Risk (01/06/2025)   Food vital sign    Within the past 12 months, you worried that your food would run out before you got money to buy more: Never true    Within the past 12 months, the food you bought just didn't last and you didn't have money to get more: Never true  Transportation Needs: No Transportation Needs (01/06/2025)   Transportation    In the past 12 months, has lack of reliable transportation kept you from medical appointments, meetings, work or from getting things needed for daily living? : No  Utilities: Low Risk (01/06/2025)   Utilities    In the past 12 months has the electric, gas, oil, or water  company threatened to shut off services in your home? : No  Safety: Low Risk (01/06/2025)   Safety    How often does anyone, including family and friends, physically hurt you?: Never    How often does anyone, including family and friends, insult or talk down to you?: Never    How often does anyone, including family and friends, threaten you with harm?: Never    How often does anyone, including family and friends, scream or curse at you?: Never  Alcohol Screening: Not on file  Tobacco Use: Low Risk (01/04/2025)   Received from [facility]   Patient History    Smoking Tobacco Use: Never    Smokeless Tobacco Use: Never    Passive Exposure: Not on file  Depression: Not At Risk (12/29/2024)   PHQ-2     PHQ-2 Score: 0  Social Connections: Unknown (01/06/2025)   Social Connection and Isolation Panel    Frequency of Communication with Friends and Family: Not on file    Frequency of Social Gatherings with Friends and Family: Not on file    Attends Religious Services: Not on file    Active Member of Clubs or Organizations: Not on file    Attends Banker Meetings: Not on file    Marital Status: Married  Physicist, Medical Strain: Low Risk (01/06/2025)   Overall Financial Resource Strain (CARDIA)    Difficulty of Paying Living Expenses: Not hard at all    Social Drivers of Health   Living Situation: Low Risk (01/06/2025)   Living Situation    What is your living situation today?: I have a steady place to live    Think about the place you live. Do you have problems with any of the following? Choose all that apply:: None/None on this list  Food Insecurity: Low Risk (01/06/2025)   Food vital sign    Within the past 12 months, you worried that your food would run out before you got money to buy more: Never true    Within the past 12 months, the  food you bought just didn't last and you didn't have money to get more: Never true  Transportation Needs: No Transportation Needs (01/06/2025)   Transportation    In the past 12 months, has lack of reliable transportation kept you from medical appointments, meetings, work or from getting things needed for daily living? : No  Utilities: Low Risk (01/06/2025)   Utilities    In the past 12 months has the electric, gas, oil, or water  company threatened to shut off services in your home? : No  Alcohol Screening: Not on file  Tobacco Use: Low Risk (01/04/2025)   Received from [facility]   Patient History    Smoking Tobacco Use: Never    Smokeless Tobacco Use: Never    Passive Exposure: Not on file  Depression: Not At Risk (12/29/2024)   PHQ-2    PHQ-2 Score: 0    Allergies[4]  Prior to Admission medications  Medication   acetaminophen  (TYLENOL ) 500 mg tablet  acyclovir  (ZOVIRAX ) 400 mg tablet  cholecalciferol (VITAMIN D3) 1,000 unit (25 mcg) tablet  DULoxetine  (CYMBALTA ) 60 mg capsule  ferrous sulfate 325 mg (65 mg iron) EC tablet  lenalidomide  (REVLIMID ) 15 mg cap  multivitamin (Flintstones/Extra C) chewable tablet  nitroglycerin  (NITROSTAT ) 0.4 mg SL tablet  pantoprazole  (PROTONIX ) 40 mg EC tablet  potassium chloride  (KLOR-CON ) 10 mEq ER tablet  prochlorperazine  (COMPAZINE ) 10 mg tablet  Restasis  0.05 % ophthalmic emulsion  rosuvastatin  (CRESTOR ) 5 mg tablet    Objective:  Physical Exam BP 99/71 (BP Location: Left arm, Patient Position: Lying)   Pulse 98   Temp 98 F (36.7 C) (Oral)   Resp 17   Ht 1.88 m (6' 2)   Wt 67.7 kg (149 lb 3.2 oz)   SpO2 97%   BMI 19.16 kg/m  General: Chronically ill appearing, cachetic male in no acute distress.  Accompanied by wife.   Head-Eyes-Ears-Nose-Throat: No scleral icterus or injection. Nares and oropharynx are clear without erythema or exudate.  Mucous membranes dry but clear. NGT in place.    Cardiovascular: Regular rate and regular rhythm without murmurs, rubs or gallops.  Chest/Lungs: Clear to auscultation throughout without wheezes, rhonchi or crackles.  No cough observed.  Abdomen: Abdomen is soft, mild tenderness to deep palpation RUQ but nondistended.  No hepatosplenomegaly appreciated. No rebound, rigidity or guarding. No evidence of an acute abdomen  Extremities: Peripheral pulses intact without edema.  Skin: Warm, dry, intact without rash. Lower extremities are cool to touch  Neuro/Psych: Alert and oriented x 3 and fully ambulatory.  Pleasant appropriate affect and normal thought content.   Venous Access: Port L chest wall.     Labs: Recent Results (from the past 48 hours)  Comprehensive Metabolic Panel   Collection Time: 01/06/25  8:43 PM  Result Value Ref Range   Sodium 136 136 - 145 mmol/L   Potassium 4.8 3.5 - 5.1 mmol/L   Chloride  101 98 - 107 mmol/L   CO2 20 (L) 21 - 31 mmol/L   Anion Gap 15 (H) 6 - 14 mmol/L   Glucose, Random 171 (H) 70 - 99 mg/dL   Blood Urea  Nitrogen (BUN) 27 (H) 7 - 25 mg/dL   Creatinine 8.36 (H) 9.29 - 1.30 mg/dL   eGFR 44 (L) >40 fO/fpw/8.26f7   Albumin 3.5 3.5 - 5.7 g/dL   Total Protein 5.6 (L) 6.4 - 8.9 g/dL   Bilirubin, Total 0.6 0.3 - 1.0 mg/dL   Alkaline Phosphatase (ALP) 50 34 - 104 U/L  Aspartate Aminotransferase (AST) 19 13 - 39 U/L   Alanine Aminotransferase (ALT) 9 7 - 52 U/L   Calcium  7.9 (L) 8.6 - 10.3 mg/dL   BUN/Creatinine Ratio 16.6 10.0 - 20.0  Magnesium    Collection Time: 01/06/25  8:43 PM  Result Value Ref Range   Magnesium  1.9 1.9 - 2.7 mg/dL  Phosphorus   Collection Time: 01/06/25  8:43 PM  Result Value Ref Range   Phosphorus 3.8 2.5 - 5.0 mg/dL  Immunoglobulins, Quantitative, IgA, IgG, IgM   Collection Time: 01/06/25  8:43 PM  Result Value Ref Range   Immunoglobulin A, Quantitative (IgA) 33 (L) 66 - 433 mg/dL   Immunoglobulin G, Quantitative (IgG) 363 (L) 635 - 1,741 mg/dL   Immunoglobulin M, Quantitative (IgM) 21 (L) 45 - 281 mg/dL  Free Kappa And Lambda Light Chains with Ratio, Quantitative   Collection Time: 01/06/25  8:43 PM  Result Value Ref Range   Free Kappa Light Chains, Serum 11.44 3.30 - 19.40 mg/L   Free Lambda Light Chains, Serum 6.01 5.71 - 26.30 mg/L   Kappa/Lambda Ratio, Serum 1.90 (H) 0.26 - 1.65  PSA, Total   Collection Time: 01/06/25  8:43 PM  Result Value Ref Range   PSA, Total 0.26 0.00 - 6.50 ng/mL  Lactate Dehydrogenase (LDH)   Collection Time: 01/06/25  8:43 PM  Result Value Ref Range   Lactate Dehydrogenase (LDH) 393 (H) 140 - 271 U/L  Uric Acid   Collection Time: 01/06/25  8:43 PM  Result Value Ref Range   Uric Acid 7.5 4.4 - 7.6 mg/dL  CBC with Differential   Collection Time: 01/06/25  8:43 PM  Result Value Ref Range   WBC 6.50 4.40 - 11.00 10*3/uL   RBC 4.52 4.50 - 5.90 10*6/uL   Hemoglobin 14.4 14.0 - 17.5 g/dL    Hematocrit 55.1 58.4 - 50.4 %   Mean Corpuscular Volume (MCV) 99.1 (H) 80.0 - 96.0 fL   Mean Corpuscular Hemoglobin (MCH) 31.9 27.5 - 33.2 pg   Mean Corpuscular Hemoglobin Conc (MCHC) 32.2 (L) 33.0 - 37.0 g/dL   Red Cell Distribution Width (RDW) 18.6 (H) 12.3 - 17.0 %   Platelet Count (PLT) 185 150 - 450 10*3/uL   Mean Platelet Volume (MPV) 10.1 6.8 - 10.2 fL   Neutrophils % 88 %   Lymphocytes % 1 %   Monocytes % 10 %   Eosinophils % 0 %   Basophils % 0 %   nRBC % 0 %   Neutrophils Absolute 5.70 1.80 - 7.80 10*3/uL   Lymphocytes # 0.10 (L) 1.00 - 4.80 10*3/uL   Monocytes # 0.70 0.00 - 0.80 10*3/uL   Eosinophils # 0.00 0.00 - 0.50 10*3/uL   Basophils # 0.00 0.00 - 0.20 10*3/uL   nRBC Absolute 0.00 <=0.00 10*3/uL  Comprehensive Metabolic Panel   Collection Time: 01/07/25  5:34 AM  Result Value Ref Range   Sodium 137 136 - 145 mmol/L   Potassium 4.3 3.5 - 5.1 mmol/L   Chloride 102 98 - 107 mmol/L   CO2 20 (L) 21 - 31 mmol/L   Anion Gap 15 (H) 6 - 14 mmol/L   Glucose, Random 216 (H) 70 - 99 mg/dL   Blood Urea  Nitrogen (BUN) 34 (H) 7 - 25 mg/dL   Creatinine 7.28 (H) 9.29 - 1.30 mg/dL   eGFR 24 (L) >40 fO/fpw/8.26f7   Albumin 3.2 (L) 3.5 - 5.7 g/dL   Total Protein 5.0 (L) 6.4 - 8.9 g/dL   Bilirubin,  Total 0.6 0.3 - 1.0 mg/dL   Alkaline Phosphatase (ALP) 45 34 - 104 U/L   Aspartate Aminotransferase (AST) 9 (L) 13 - 39 U/L   Alanine Aminotransferase (ALT) 8 7 - 52 U/L   Calcium  7.2 (L) 8.6 - 10.3 mg/dL   BUN/Creatinine Ratio 12.5 10.0 - 20.0   Corrected Calcium  7.8 mg/dL  Magnesium    Collection Time: 01/07/25  5:34 AM  Result Value Ref Range   Magnesium  1.7 (L) 1.9 - 2.7 mg/dL  Phosphorus   Collection Time: 01/07/25  5:34 AM  Result Value Ref Range   Phosphorus 5.0 2.5 - 5.0 mg/dL  CBC with Differential   Collection Time: 01/07/25  5:34 AM  Result Value Ref Range   WBC 10.80 4.40 - 11.00 10*3/uL   RBC 4.02 (L) 4.50 - 5.90 10*6/uL   Hemoglobin 12.6 (L) 14.0 - 17.5 g/dL    Hematocrit 62.3 (L) 41.5 - 50.4 %   Mean Corpuscular Volume (MCV) 93.5 80.0 - 96.0 fL   Mean Corpuscular Hemoglobin (MCH) 31.4 27.5 - 33.2 pg   Mean Corpuscular Hemoglobin Conc (MCHC) 33.5 33.0 - 37.0 g/dL   Red Cell Distribution Width (RDW) 18.6 (H) 12.3 - 17.0 %   Platelet Count (PLT) 221 150 - 450 10*3/uL   Mean Platelet Volume (MPV) 10.5 (H) 6.8 - 10.2 fL   Neutrophils % 85 %   Lymphocytes % 1 %   Monocytes % 14 %   Eosinophils % 0 %   Basophils % 0 %   nRBC % 0 %   Neutrophils Absolute 9.20 (H) 1.80 - 7.80 10*3/uL   Lymphocytes # 0.10 (L) 1.00 - 4.80 10*3/uL   Monocytes # 1.50 (H) 0.00 - 0.80 10*3/uL   Eosinophils # 0.00 0.00 - 0.50 10*3/uL   Basophils # 0.00 0.00 - 0.20 10*3/uL   nRBC Absolute 0.00 <=0.00 10*3/uL    Radiology Results (last 72 hours)     Procedure Component Value Units Date/Time   CT Abdomen Pelvis W IV Only [[id number]] Resulted: 01/03/25 2356   Order Status: Completed Updated: 01/04/25 1408   Narrative:     EXAM:CT ABDOMEN AND PELVIS WITH CONTRAST01/13/2026 11:29:28 PMTECHNIQUE:CT of the abdomen and pelvis was performed with the administration ofintravenous contrast. Multiplanar reformatted images are provided for review.Automated exposure control, iterative reconstruction, and/or weight-basedadjustment of the mA/kV was utilized to reduce the radiation dose to as low asreasonably achievable. 100 mL of iohexol  (OMNIPAQUE ) 300 MG/ML solution wasadministered.COMPARISON:12/19/2025CLINICAL HISTORY:Bowel obstruction. Nausea, abdominal pain, vomiting.FINDINGS:LOWER CHEST:Bilateral posterior basal consolidation and superimposed atelectasis suggestiveof subacute infection or aspiration.LIVER:Innumerable simple cysts are noted throughout the liver. No enhancingintrahepatic mass. No intra or extrahepatic biliary ductal dilation.GALLBLADDER AND BILE DUCTS:Gallbladder is unremarkable. No biliary ductal dilatation.SPLEEN:No acute abnormality.PANCREAS:No acute abnormality.ADRENAL  GLANDS:No acute abnormality.KIDNEYS, URETERS AND BLADDER:A simple cortical cyst is present within the right kidney, for which nofollow-up imaging is recommended. No stones in the kidneys or ureters. Nohydronephrosis. No perinephric or periureteral stranding. The bladder is notdistended.GI AND BOWEL:Duodenal malrotation is present. The stomach and proximal duodenum arefluid-filled and mildly dilated, similar to prior examination, with an abrupttransition at the juncture of the second and third portion of the duodenumwhere this appears encased by an infiltrative process described below.Mechanical obstruction secondary to the infiltrative process is suspected. Thestomach, small bowel, and large bowel are otherwise unremarkable. The appendixis normal.PERITONEUM AND RETROPERITONEUM:An infiltrative soft tissue process encasing the right renal vasculature andextending from the interval region of the right kidney to encase the junctureof the second and third portion of  the duodenum is again identified and isgrossly unchanged, measuring roughly 5.5 x 5.7 cm (volume 89.9 cm3, calculatedas 5.5 x 5.7 x 5.7 x 0.52). Differential considerations should includeneoplastic processes such as lymphoma or inflammatory conditions such as IgG4related disease or sarcoidosis. No ascites. No free air.VASCULATURE:Aorta is normal in caliber. Mild aortoiliac atherosclerotic calcification. Noaortic aneurysm.LYMPH NODES:No lymphadenopathy.REPRODUCTIVE ORGANS:The prostate gland is massively enlarged. A 60 mm hypodense nodule within theright peripheral zone is nonspecific; however, a focal neoplasm is not excludedand correlation with serum PSA level is recommended. Prostate MRI examinationwould also be helpful for further evaluation.BONES AND SOFT TISSUES:Changes of Paget's disease of the bone are noted involving the sacrum . Similarchanges are noted within the left pubic symphysis. No suspicious lytic orblastic bone lesions are seen. No focal soft  tissue abnormality.IMPRESSION:1. Mechanical gastric and proximal duodenal obstruction at the junction of thesecond and third portions of the duodenum, related to the infiltrative processdescribed below.2. Infiltrative soft tissue encasing the right renal vasculature and extendingto the duodenum, grossly unchanged, measuring roughly 5.5 x 5.7 cm, withdifferential considerations including lymphoma and inflammatory conditions suchas IgG4-related disease or sarcoidosis. Correlation with serum laboratoryexamination including total IgG level may be helpful for further evaluation3. Bilateral posterior basal consolidation and superimposed atelectasis,suggestive of subacute infection or aspiration. 4. Massive prostatomegaly with a 16 mm hypodense nodule in the right peripheralzone, with focal neoplasm not excluded, with PSA evaluation and prostate MRIsuggested for further assessment.5. Duodenal malrotation.6. Innumerable simple hepatic cysts in keeping with polycystic liver disease .7. 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 Raf score includes aortic atherosclerosis (ICD10-I70.0).Electronically signed by: Dorethia Molt MD 01/03/2025 11:56 PM EST RPWorkstation: [id number]       Review of Ancillary Data: I have obtained additional historical information from a family member or outside physician. Important points: abdominal pain and need for additional anti-emetics  Active Problems: Problem List[5]  Present on Admission:   Gastric Outlet Obstruction secondary to retroperitoneal mass  Metastatic Prostate Cancer IgG Kappa Multiple Myeloma   ASSESSMENT and PLAN:  Gastric Outlet obstruction secondary to retroperitoneal mass with duodenal encasement Admitted 01/19 to [facility] in setting of gastric outlet obstruction. Retroperitoneal mass initially incidentally found on CT ab/pelvis which was obtained on 12/09/24 for abdominal pain and diarrhea. He was seen by IR at [facility] on  12/26/24 for image guided biopsy however there was no safe percutaneous window identified so biopsy was not attempted. [facility] with biopsy which was scheduled for 01/12/25. 01/16 - Admit as transfer from [facility] Consult surgical oncology for biopsy and gastric outlet obstruction management. NPO. PRN zofran  and compazine  for nausea/vomiting. F/u gastric and duodenal biopsies taken at [facility] on 12/25/24. Send LDH and uric acid.  01/17:  Surg onc consulted, KUB to confirm NGT placement and if confirmed then can do LIS.  SurgOnc recc advanced GI consulted for consideration of transduodenal biopsy.  I called [place] and asked them to push imaging to [facility]. Follow up IgG 4 class subtypes.  Need to get biopsy results from OSH.   IgG Kappa MM current treatment with Kpd as bridging therapy to CAR-T (carvykti) Patient follows locally at [facility] with Dr. Davonna and was referred to Dr. Fernande for CAR-T evaluation. Last seen 12/29/24 with plans for CAR-T at the end of January 01/16 - Send myeloma serologies including immunoglobulins, free light chains, SPEP, and IFEX.  01/17: IgA 33, IgG 363, IgM 21. Kappa 11.44, Lambda 6.01. f/u IFE, SPEP  Metastatic Prostate Cancer Patient follows with [facility]. Current treatment with Eligard  (ADT).  PSA: 0.26  Disease & Chemotherapy Induced Pancytopenia  Monitor counts daily.  Transfuse PRN per protocol.  FEN NS at 50cc/hr while NPO.  Electrolytes per protocol.  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  VTE SCDs while in bed.  GI PPI IV in place of home prilosec.  Chronic Medical Conditions  HLD Continue home statin when able to take PO  Ethics Full code.   Electronically signed by:  Lynwood Ozell Hoar, MD 01/07/2025 3:29 PM          [1] Past Medical History: Diagnosis Date   Arthritis    Cancer    (CMD) 02/26/2022   Multiple Myeloma   GERD (gastroesophageal reflux  disease) 10/15/2017   Last Assessment & Plan:  Formatting of this note might be different from the original. On Pantoprazole  40 mg QOD   Multiple myeloma (CMD) 04/09/2022   Neuromuscular disorder (CMD)    Prostate cancer    (CMD) 08/17/2015   Last Assessment & Plan:  Formatting of this note might be different from the original. Has had prostate biopsy Elevated PSA Follows up with Urologist - under PSA surveillance Did not tolerate Finasteride  Referred to Oncology for concern of pelvic mass   Pulmonary nodule 07/19/2017   Vitamin D  deficiency 06/05/2014   Last Assessment & Plan:  Formatting of this note might be different from the original. ERGOCALCIFEROL   X 12 WEEKS  [2] Past Surgical History: Procedure Laterality Date   COLONOSCOPY  10/05/2023   [facility]  03/2022   UPPER GASTROINTESTINAL ENDOSCOPY  2018   2018  [3] No family history on file. [4] Allergies Allergen Reactions   Sulfa (Sulfonamide Antibiotics) Other (See Comments)    Unknown reaction. Childhood reaction  [5] Patient Active Problem List Diagnosis   GERD (gastroesophageal reflux disease)   Prostate cancer    (CMD)   Pulmonary nodule   Multiple myeloma (CMD)   Vitamin D  deficiency   Pre-transplant evaluation for stem cell transplant   Autologous donor of stem cells   Benign prostatic hyperplasia with urinary frequency   Idiopathic peripheral neuropathy   Encounter for antineoplastic immunotherapy   Exposure to asbestos   History of DVT (deep vein thrombosis)   History of pulmonary embolism   Gastric outlet obstruction (CMD)

## 2025-01-08 NOTE — Progress Notes (Signed)
 ------------------------------------------------------------------------------- Attestation signed by Liborio Dolin, MD at 01/08/2025 11:20 PM I saw and evaluated the patient and reviewed Dr. Pixie note. I agree with the findings and plan outlined above.   74 year old male patient with IgG kappa multiple myeloma and metastatic prostate cancer, admitted for gastric outlet obstruction due to an ill-defined mass around the 2nd/3rd portion of duodenum.  EGD and biopsies obtained at OSH were negative.  We consulted surgical oncology, who recommended consulting GI for endoscopic gastrojejunostomy and repeat biopsies from the retroperitoneal mass.    Patient feels better with NG tube to suction.  We will consult GI as recommended.  Obtaining IgG4 levels to evaluate for IgG4 disease.  His myeloma labs show a CR.   Liborio Dolin, MD 01/08/2025 11:18 PM   -------------------------------------------------------------------------------  BMT Progress Note  Chief Complaint: Nausea, Vomiting  Review of Systems Pertinent items are noted in HPI.  History of Present Illness: Luis Stewart is a 74 year old male with known history of  of IgG Kappa Multiple Myeloma, metastatic prostate cancer (bone mets, current treatment with ADT), HLD, GERD, and recent diagnosis of retroperitoneal mass with obstruction of stomach and duodenal bulb (pending biopsy) who presents as transfer from OSH for further management of gastric outlet obstruction.   Per chart review, patient presented to OSH ED on 01/04/25 with complaint of nausea and vomiting x 4 days prior to presentation. Lab work notable for creatinine of 1.37. CT ab/pelvis obtained which revealed mechanical gastric proximal duodenal obstruction at the 2nd and 3rd portions of the duodenum. He was given zofran  and IV fluids. He was admitted to OSH for further management of gastric outlet obstruction. While admitted,  GI consulted and he underwent EGD on 01/04/25  which revealed extrinsic compression of duodenum with markedly edematous mucosa. Biopsy of the duodenum and gastric antrum were taken. Post procedure he was started on a clear liquid diet and then a full liquid diet. He had difficulty tolerating advanced diet, so general surgery was consulted. Given patient receives cancer care at [facility], he was transferred for further management of gastric outlet obstruction.   Subjective: Overnight, patient had NG tube placed to suction.  He had no further episodes of emesis since.  He endorsed some pain in his right side when he takes a deep breath.  Oncology History  Multiple myeloma (CMD)  04/09/2022 Initial Diagnosis   Multiple myeloma (HCC)   11/28/2022 - 12/02/2022 Chemotherapy   OP ONC Supportive Filgrastim  (ZARXIO ) / Plerixafor  (MOZOBIL ) for mobilization Plan Provider: Norleen Debby Funk, DO Treatment goal: Supportive Line of treatment: Transplant   01/08/2023 - 01/12/2023 Chemotherapy   OP ONC Supportive Filgrastim  (Zarxio ) / Plerixafor  (MOZOBIL ) for mobilization Plan Provider: Norleen Debby Funk, DO Treatment goal: Control Line of treatment: Transplant   10/10/2024 -  Supportive Treatment   Adult Peripheral IV and Central Venous Access Orders & Alteplase  PRN Plan Provider: Norleen Debby Funk, MD   11/09/2024 - 11/09/2024 Supportive Treatment   BMT Apheresis Un-Mobilized Collection Orders Plan Provider: Norleen Debby Funk, MD   11/24/2024 -  Supportive Treatment   BMT Apheresis Un-Mobilized Collection Orders Plan Provider: Norleen Debby Funk, MD   01/10/2025 -  Oncology Treatment   Protocol TCT - WF Carvykti - Lymphodepleting Fludarabine / Cyclophosphamide for CAR-T Ciltacabtagene Autoleucel (Multiple Myeloma)  Chemotherapy/Immunotherapy Medications ciltacabtagene autoleucel (CARVYKTI) infusion 1 Dose, 1 Dose, intravenous fludarabine (FLUDARA) 63.75 mg in sodium chloride  0.9 % 102.6 mL chemo IVPB, 30 mg/m2 = 63.75 mg,  intravenous cycloPHOSphamide (CYTOXAN) 636 mg in  sodium chloride  0.9 % 281.8 mL chemo IVPB, 300 mg/m2 = 636 mg, intravenous  [Plan is still active]     Medical History[1]  Surgical History[2]  Family History[3]  Social History   Socioeconomic History   Marital status: Married    Spouse name: Not on file   Number of children: Not on file   Years of education: Not on file   Highest education level: Not on file  Occupational History   Not on file  Tobacco Use   Smoking status: Never   Smokeless tobacco: Never  Substance and Sexual Activity   Alcohol use: Not Currently   Drug use: Never   Sexual activity: Not Currently  Other Topics Concern   Not on file  Social History Narrative   Not on file   Social Drivers of Health   Living Situation: Low Risk (01/06/2025)   Living Situation    What is your living situation today?: I have a steady place to live    Think about the place you live. Do you have problems with any of the following? Choose all that apply:: None/None on this list  Food Insecurity: Low Risk (01/06/2025)   Food vital sign    Within the past 12 months, you worried that your food would run out before you got money to buy more: Never true    Within the past 12 months, the food you bought just didn't last and you didn't have money to get more: Never true  Transportation Needs: No Transportation Needs (01/06/2025)   Transportation    In the past 12 months, has lack of reliable transportation kept you from medical appointments, meetings, work or from getting things needed for daily living? : No  Utilities: Low Risk (01/06/2025)   Utilities    In the past 12 months has the electric, gas, oil, or water  company threatened to shut off services in your home? : No  Safety: Low Risk (01/06/2025)   Safety    How often does anyone, including family and friends, physically hurt you?: Never    How often does anyone, including family and friends, insult or  talk down to you?: Never    How often does anyone, including family and friends, threaten you with harm?: Never    How often does anyone, including family and friends, scream or curse at you?: Never  Alcohol Screening: Not on file  Tobacco Use: Low Risk (01/04/2025)   Received from [facility]   Patient History    Smoking Tobacco Use: Never    Smokeless Tobacco Use: Never    Passive Exposure: Not on file  Depression: Not At Risk (12/29/2024)   PHQ-2    PHQ-2 Score: 0  Social Connections: Unknown (01/06/2025)   Social Connection and Isolation Panel    Frequency of Communication with Friends and Family: Not on file    Frequency of Social Gatherings with Friends and Family: Not on file    Attends Religious Services: Not on file    Active Member of Clubs or Organizations: Not on file    Attends Banker Meetings: Not on file    Marital Status: Married  Physicist, Medical Strain: Low Risk (01/06/2025)   Overall Financial Resource Strain (CARDIA)    Difficulty of Paying Living Expenses: Not hard at all    Social Drivers of Health   Living Situation: Low Risk (01/06/2025)   Living Situation    What is your living situation today?: I have a steady place to  live    Think about the place you live. Do you have problems with any of the following? Choose all that apply:: None/None on this list  Food Insecurity: Low Risk (01/06/2025)   Food vital sign    Within the past 12 months, you worried that your food would run out before you got money to buy more: Never true    Within the past 12 months, the food you bought just didn't last and you didn't have money to get more: Never true  Transportation Needs: No Transportation Needs (01/06/2025)   Transportation    In the past 12 months, has lack of reliable transportation kept you from medical appointments, meetings, work or from getting things needed for daily living? : No  Utilities: Low Risk (01/06/2025)   Utilities     In the past 12 months has the electric, gas, oil, or water  company threatened to shut off services in your home? : No  Alcohol Screening: Not on file  Tobacco Use: Low Risk (01/04/2025)   Received from [facility]   Patient History    Smoking Tobacco Use: Never    Smokeless Tobacco Use: Never    Passive Exposure: Not on file  Depression: Not At Risk (12/29/2024)   PHQ-2    PHQ-2 Score: 0    Allergies[4]  Prior to Admission medications  Medication  acetaminophen  (TYLENOL ) 500 mg tablet  acyclovir  (ZOVIRAX ) 400 mg tablet  cholecalciferol (VITAMIN D3) 1,000 unit (25 mcg) tablet  DULoxetine  (CYMBALTA ) 60 mg capsule  ferrous sulfate 325 mg (65 mg iron) EC tablet  lenalidomide  (REVLIMID ) 15 mg cap  multivitamin (Flintstones/Extra C) chewable tablet  nitroglycerin  (NITROSTAT ) 0.4 mg SL tablet  pantoprazole  (PROTONIX ) 40 mg EC tablet  potassium chloride  (KLOR-CON ) 10 mEq ER tablet  prochlorperazine  (COMPAZINE ) 10 mg tablet  Restasis  0.05 % ophthalmic emulsion  rosuvastatin  (CRESTOR ) 5 mg tablet    Objective:  Physical Exam BP (!) 125/96 (BP Location: Left arm, Patient Position: Lying)   Pulse 106   Temp 98 F (36.7 C) (Oral)   Resp 16   Ht 1.88 m (6' 2)   Wt 67.7 kg (149 lb 3.2 oz)   SpO2 98%   BMI 19.16 kg/m  General: Chronically ill appearing, cachetic male in no acute distress.  Accompanied by wife.   Head-Eyes-Ears-Nose-Throat: Atraumatic and normocephalic.  NG tube in place.  Cardiovascular: Regular rate and regular rhythm without murmurs, rubs or gallops.  Chest/Lungs: Clear to auscultation throughout without wheezes, rhonchi or crackles.    Abdomen: Nontender abdomen.  No guarding.  Extremities: No pitting lower extremity edema.  Neuro/Psych: Alert and awake.  Answering questions appropriately.     Labs: Recent Results (from the past 48 hours)  Comprehensive Metabolic Panel   Collection Time: 01/06/25  8:43 PM  Result Value Ref Range   Sodium 136 136 - 145  mmol/L   Potassium 4.8 3.5 - 5.1 mmol/L   Chloride 101 98 - 107 mmol/L   CO2 20 (L) 21 - 31 mmol/L   Anion Gap 15 (H) 6 - 14 mmol/L   Glucose, Random 171 (H) 70 - 99 mg/dL   Blood Urea  Nitrogen (BUN) 27 (H) 7 - 25 mg/dL   Creatinine 8.36 (H) 9.29 - 1.30 mg/dL   eGFR 44 (L) >40 fO/fpw/8.26f7   Albumin 3.5 3.5 - 5.7 g/dL   Total Protein 5.6 (L) 6.4 - 8.9 g/dL   Bilirubin, Total 0.6 0.3 - 1.0 mg/dL   Alkaline Phosphatase (ALP) 50 34 -  104 U/L   Aspartate Aminotransferase (AST) 19 13 - 39 U/L   Alanine Aminotransferase (ALT) 9 7 - 52 U/L   Calcium  7.9 (L) 8.6 - 10.3 mg/dL   BUN/Creatinine Ratio 16.6 10.0 - 20.0  Magnesium    Collection Time: 01/06/25  8:43 PM  Result Value Ref Range   Magnesium  1.9 1.9 - 2.7 mg/dL  Phosphorus   Collection Time: 01/06/25  8:43 PM  Result Value Ref Range   Phosphorus 3.8 2.5 - 5.0 mg/dL  Immunoglobulins, Quantitative, IgA, IgG, IgM   Collection Time: 01/06/25  8:43 PM  Result Value Ref Range   Immunoglobulin A, Quantitative (IgA) 33 (L) 66 - 433 mg/dL   Immunoglobulin G, Quantitative (IgG) 363 (L) 635 - 1,741 mg/dL   Immunoglobulin M, Quantitative (IgM) 21 (L) 45 - 281 mg/dL  Free Kappa And Lambda Light Chains with Ratio, Quantitative   Collection Time: 01/06/25  8:43 PM  Result Value Ref Range   Free Kappa Light Chains, Serum 11.44 3.30 - 19.40 mg/L   Free Lambda Light Chains, Serum 6.01 5.71 - 26.30 mg/L   Kappa/Lambda Ratio, Serum 1.90 (H) 0.26 - 1.65  PSA, Total   Collection Time: 01/06/25  8:43 PM  Result Value Ref Range   PSA, Total 0.26 0.00 - 6.50 ng/mL  Lactate Dehydrogenase (LDH)   Collection Time: 01/06/25  8:43 PM  Result Value Ref Range   Lactate Dehydrogenase (LDH) 393 (H) 140 - 271 U/L  Uric Acid   Collection Time: 01/06/25  8:43 PM  Result Value Ref Range   Uric Acid 7.5 4.4 - 7.6 mg/dL  CBC with Differential   Collection Time: 01/06/25  8:43 PM  Result Value Ref Range   WBC 6.50 4.40 - 11.00 10*3/uL   RBC 4.52 4.50 -  5.90 10*6/uL   Hemoglobin 14.4 14.0 - 17.5 g/dL   Hematocrit 55.1 58.4 - 50.4 %   Mean Corpuscular Volume (MCV) 99.1 (H) 80.0 - 96.0 fL   Mean Corpuscular Hemoglobin (MCH) 31.9 27.5 - 33.2 pg   Mean Corpuscular Hemoglobin Conc (MCHC) 32.2 (L) 33.0 - 37.0 g/dL   Red Cell Distribution Width (RDW) 18.6 (H) 12.3 - 17.0 %   Platelet Count (PLT) 185 150 - 450 10*3/uL   Mean Platelet Volume (MPV) 10.1 6.8 - 10.2 fL   Neutrophils % 88 %   Lymphocytes % 1 %   Monocytes % 10 %   Eosinophils % 0 %   Basophils % 0 %   nRBC % 0 %   Neutrophils Absolute 5.70 1.80 - 7.80 10*3/uL   Lymphocytes # 0.10 (L) 1.00 - 4.80 10*3/uL   Monocytes # 0.70 0.00 - 0.80 10*3/uL   Eosinophils # 0.00 0.00 - 0.50 10*3/uL   Basophils # 0.00 0.00 - 0.20 10*3/uL   nRBC Absolute 0.00 <=0.00 10*3/uL  Comprehensive Metabolic Panel   Collection Time: 01/07/25  5:34 AM  Result Value Ref Range   Sodium 137 136 - 145 mmol/L   Potassium 4.3 3.5 - 5.1 mmol/L   Chloride 102 98 - 107 mmol/L   CO2 20 (L) 21 - 31 mmol/L   Anion Gap 15 (H) 6 - 14 mmol/L   Glucose, Random 216 (H) 70 - 99 mg/dL   Blood Urea  Nitrogen (BUN) 34 (H) 7 - 25 mg/dL   Creatinine 7.28 (H) 9.29 - 1.30 mg/dL   eGFR 24 (L) >40 fO/fpw/8.26f7   Albumin 3.2 (L) 3.5 - 5.7 g/dL   Total Protein 5.0 (L) 6.4 - 8.9  g/dL   Bilirubin, Total 0.6 0.3 - 1.0 mg/dL   Alkaline Phosphatase (ALP) 45 34 - 104 U/L   Aspartate Aminotransferase (AST) 9 (L) 13 - 39 U/L   Alanine Aminotransferase (ALT) 8 7 - 52 U/L   Calcium  7.2 (L) 8.6 - 10.3 mg/dL   BUN/Creatinine Ratio 12.5 10.0 - 20.0   Corrected Calcium  7.8 mg/dL  Magnesium    Collection Time: 01/07/25  5:34 AM  Result Value Ref Range   Magnesium  1.7 (L) 1.9 - 2.7 mg/dL  Phosphorus   Collection Time: 01/07/25  5:34 AM  Result Value Ref Range   Phosphorus 5.0 2.5 - 5.0 mg/dL  CBC with Differential   Collection Time: 01/07/25  5:34 AM  Result Value Ref Range   WBC 10.80 4.40 - 11.00 10*3/uL   RBC 4.02 (L) 4.50 - 5.90  10*6/uL   Hemoglobin 12.6 (L) 14.0 - 17.5 g/dL   Hematocrit 62.3 (L) 58.4 - 50.4 %   Mean Corpuscular Volume (MCV) 93.5 80.0 - 96.0 fL   Mean Corpuscular Hemoglobin (MCH) 31.4 27.5 - 33.2 pg   Mean Corpuscular Hemoglobin Conc (MCHC) 33.5 33.0 - 37.0 g/dL   Red Cell Distribution Width (RDW) 18.6 (H) 12.3 - 17.0 %   Platelet Count (PLT) 221 150 - 450 10*3/uL   Mean Platelet Volume (MPV) 10.5 (H) 6.8 - 10.2 fL   Neutrophils % 85 %   Lymphocytes % 1 %   Monocytes % 14 %   Eosinophils % 0 %   Basophils % 0 %   nRBC % 0 %   Neutrophils Absolute 9.20 (H) 1.80 - 7.80 10*3/uL   Lymphocytes # 0.10 (L) 1.00 - 4.80 10*3/uL   Monocytes # 1.50 (H) 0.00 - 0.80 10*3/uL   Eosinophils # 0.00 0.00 - 0.50 10*3/uL   Basophils # 0.00 0.00 - 0.20 10*3/uL   nRBC Absolute 0.00 <=0.00 10*3/uL  Comprehensive Metabolic Panel   Collection Time: 01/08/25  5:35 AM  Result Value Ref Range   Sodium 137 136 - 145 mmol/L   Potassium 3.8 3.5 - 5.1 mmol/L   Chloride 105 98 - 107 mmol/L   CO2 22 21 - 31 mmol/L   Anion Gap 10 6 - 14 mmol/L   Glucose, Random 163 (H) 70 - 99 mg/dL   Blood Urea  Nitrogen (BUN) 45 (H) 7 - 25 mg/dL   Creatinine 7.13 (H) 9.29 - 1.30 mg/dL   eGFR 22 (L) >40 fO/fpw/8.26f7   Albumin 2.7 (L) 3.5 - 5.7 g/dL   Total Protein 4.5 (L) 6.4 - 8.9 g/dL   Bilirubin, Total 0.4 0.3 - 1.0 mg/dL   Alkaline Phosphatase (ALP) 38 34 - 104 U/L   Aspartate Aminotransferase (AST) 8 (L) 13 - 39 U/L   Alanine Aminotransferase (ALT) 7 7 - 52 U/L   Calcium  6.3 (L) 8.6 - 10.3 mg/dL   BUN/Creatinine Ratio 15.7 10.0 - 20.0   Corrected Calcium  7.3 mg/dL  Magnesium    Collection Time: 01/08/25  5:35 AM  Result Value Ref Range   Magnesium  2.0 1.9 - 2.7 mg/dL  Phosphorus   Collection Time: 01/08/25  5:35 AM  Result Value Ref Range   Phosphorus 3.2 2.5 - 5.0 mg/dL  CBC with Differential   Collection Time: 01/08/25  5:35 AM  Result Value Ref Range   WBC 9.60 4.40 - 11.00 10*3/uL   RBC 3.54 (L) 4.50 - 5.90  10*6/uL   Hemoglobin 11.1 (L) 14.0 - 17.5 g/dL   Hematocrit 66.4 (L) 58.4 - 50.4 %  Mean Corpuscular Volume (MCV) 94.6 80.0 - 96.0 fL   Mean Corpuscular Hemoglobin (MCH) 31.3 27.5 - 33.2 pg   Mean Corpuscular Hemoglobin Conc (MCHC) 33.1 33.0 - 37.0 g/dL   Red Cell Distribution Width (RDW) 18.8 (H) 12.3 - 17.0 %   Platelet Count (PLT) 165 150 - 450 10*3/uL   Mean Platelet Volume (MPV) 10.0 6.8 - 10.2 fL   Neutrophils % 89 %   Lymphocytes % 2 %   Monocytes % 9 %   Eosinophils % 0 %   Basophils % 0 %   nRBC % 0 %   Neutrophils Absolute 8.50 (H) 1.80 - 7.80 10*3/uL   Lymphocytes # 0.20 (L) 1.00 - 4.80 10*3/uL   Monocytes # 0.90 (H) 0.00 - 0.80 10*3/uL   Eosinophils # 0.00 0.00 - 0.50 10*3/uL   Basophils # 0.00 0.00 - 0.20 10*3/uL   nRBC Absolute 0.00 <=0.00 10*3/uL    Radiology Results (last 72 hours)     Procedure Component Value Units Date/Time   CT Abdomen Pelvis W IV Only [[id number]] Resulted: 01/03/25 2356   Order Status: Completed Updated: 01/04/25 1408   Narrative:     EXAM:CT ABDOMEN AND PELVIS WITH CONTRAST01/13/2026 11:29:28 PMTECHNIQUE:CT of the abdomen and pelvis was performed with the administration ofintravenous contrast. Multiplanar reformatted images are provided for review.Automated exposure control, iterative reconstruction, and/or weight-basedadjustment of the mA/kV was utilized to reduce the radiation dose to as low asreasonably achievable. 100 mL of iohexol  (OMNIPAQUE ) 300 MG/ML solution wasadministered.COMPARISON:12/19/2025CLINICAL HISTORY:Bowel obstruction. Nausea, abdominal pain, vomiting.FINDINGS:LOWER CHEST:Bilateral posterior basal consolidation and superimposed atelectasis suggestiveof subacute infection or aspiration.LIVER:Innumerable simple cysts are noted throughout the liver. No enhancingintrahepatic mass. No intra or extrahepatic biliary ductal dilation.GALLBLADDER AND BILE DUCTS:Gallbladder is unremarkable. No biliary ductal dilatation.SPLEEN:No acute  abnormality.PANCREAS:No acute abnormality.ADRENAL GLANDS:No acute abnormality.KIDNEYS, URETERS AND BLADDER:A simple cortical cyst is present within the right kidney, for which nofollow-up imaging is recommended. No stones in the kidneys or ureters. Nohydronephrosis. No perinephric or periureteral stranding. The bladder is notdistended.GI AND BOWEL:Duodenal malrotation is present. The stomach and proximal duodenum arefluid-filled and mildly dilated, similar to prior examination, with an abrupttransition at the juncture of the second and third portion of the duodenumwhere this appears encased by an infiltrative process described below.Mechanical obstruction secondary to the infiltrative process is suspected. Thestomach, small bowel, and large bowel are otherwise unremarkable. The appendixis normal.PERITONEUM AND RETROPERITONEUM:An infiltrative soft tissue process encasing the right renal vasculature andextending from the interval region of the right kidney to encase the junctureof the second and third portion of the duodenum is again identified and isgrossly unchanged, measuring roughly 5.5 x 5.7 cm (volume 89.9 cm3, calculatedas 5.5 x 5.7 x 5.7 x 0.52). Differential considerations should includeneoplastic processes such as lymphoma or inflammatory conditions such as IgG4related disease or sarcoidosis. No ascites. No free air.VASCULATURE:Aorta is normal in caliber. Mild aortoiliac atherosclerotic calcification. Noaortic aneurysm.LYMPH NODES:No lymphadenopathy.REPRODUCTIVE ORGANS:The prostate gland is massively enlarged. A 60 mm hypodense nodule within theright peripheral zone is nonspecific; however, a focal neoplasm is not excludedand correlation with serum PSA level is recommended. Prostate MRI examinationwould also be helpful for further evaluation.BONES AND SOFT TISSUES:Changes of Paget's disease of the bone are noted involving the sacrum . Similarchanges are noted within the left pubic symphysis. No suspicious  lytic orblastic bone lesions are seen. No focal soft tissue abnormality.IMPRESSION:1. Mechanical gastric and proximal duodenal obstruction at the junction of thesecond and third portions of the duodenum, related to the infiltrative processdescribed below.2. Infiltrative soft tissue encasing  the right renal vasculature and extendingto the duodenum, grossly unchanged, measuring roughly 5.5 x 5.7 cm, withdifferential considerations including lymphoma and inflammatory conditions suchas IgG4-related disease or sarcoidosis. Correlation with serum laboratoryexamination including total IgG level may be helpful for further evaluation3. Bilateral posterior basal consolidation and superimposed atelectasis,suggestive of subacute infection or aspiration. 4. Massive prostatomegaly with a 16 mm hypodense nodule in the right peripheralzone, with focal neoplasm not excluded, with PSA evaluation and prostate MRIsuggested for further assessment.5. Duodenal malrotation.6. Innumerable simple hepatic cysts in keeping with polycystic liver disease .7. 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 Raf score includes aortic atherosclerosis (ICD10-I70.0).Electronically signed by: Dorethia Molt MD 01/03/2025 11:56 PM EST RPWorkstation: [id number]       Review of Ancillary Data: I have obtained additional historical information from a family member or outside physician. Important points: abdominal pain and need for additional anti-emetics  Active Problems: Problem List[5]  Present on Admission:   Gastric Outlet Obstruction secondary to retroperitoneal mass  Metastatic Prostate Cancer IgG Kappa Multiple Myeloma   ASSESSMENT and PLAN:  Gastric Outlet obstruction secondary to retroperitoneal mass with duodenal encasement Admitted 01/19 to [facility] in setting of gastric outlet obstruction. Retroperitoneal mass initially incidentally found on CT ab/pelvis which was obtained on 12/09/24 for abdominal  pain and diarrhea. He was seen by IR at [facility] on 12/26/24 for image guided biopsy however there was no safe percutaneous window identified so biopsy was not attempted. Referral was made at [facility] for EGD/EUS with biopsy which was scheduled for 01/12/25. 01/16 - Admit as transfer from [facility]. Consult surgical oncology for biopsy and gastric outlet obstruction management. NPO. PRN zofran  and compazine  for nausea/vomiting. F/u gastric and duodenal biopsies taken at [facility] on 12/25/24. Send LDH and uric acid.  01/17:  Surg onc consulted, KUB to confirm NGT placement and if confirmed then can do LIS.  SurgOnc recc advanced GI consulted for consideration of transduodenal biopsy. Called [place] and asked them to push imaging to [facility]. Follow up IgG 4 class subtypes.   01/18: Ng tube was placed to suction overnight. Gastric and duodenal biopsies from 01/04/2025 were negative for dysplasia or malignancy.  GI planning for EUS/ERCP on Tuesday, 1/20; needs to be NPO after midnight on Monday.  IgG Kappa MM current treatment with Kpd as bridging therapy to CAR-T (carvykti) Patient follows locally at [facility] with Dr. Davonna and was referred to Dr. Fernande for CAR-T evaluation. Last seen 12/29/24 with plans for CAR-T at the end of January 01/16 - Send myeloma serologies including immunoglobulins, free light chains, SPEP, and IFEX.  01/17: IgA 33, IgG 363, IgM 21. Kappa 11.44, Lambda 6.01. f/u IFE, SPEP  Metastatic Prostate Cancer Patient follows with [facility]. Current treatment with Eligard  (ADT).  PSA: 0.26  Right-sided pleuritic pain 01/18-Patient endorsed pleuritic pain on his right side since he was transferred via ambulance.  Ordered chest x-ray which showed a new patchy opacity in the right lower lung which could represent pneumonia.  He is currently afebrile.  If he were to spike fever will start treatment for hospital-acquired pneumonia.  Disease & Chemotherapy Induced Pancytopenia  Monitor  counts daily.  Transfuse PRN per protocol.  FEN D5 half NS at 100cc/hr while NPO.  Electrolytes per protocol.  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  On 01/18, switched to acyclovir  250 mg IV daily due to inability to take p.o. medications at this time (renally dosed).  VTE SCDs while in bed.  GI PPI IV in place of home prilosec.  Chronic Medical Conditions  HLD Continue home statin when able to take PO  Ethics Full code.    Electronically signed by:  Rella Fetters, MD 01/08/2025 2:20 PM          [1] Past Medical History: Diagnosis Date   Arthritis    Cancer    (CMD) 02/26/2022   Multiple Myeloma   GERD (gastroesophageal reflux disease) 10/15/2017   Last Assessment & Plan:  Formatting of this note might be different from the original. On Pantoprazole  40 mg QOD   Multiple myeloma (CMD) 04/09/2022   Neuromuscular disorder (CMD)    Prostate cancer    (CMD) 08/17/2015   Last Assessment & Plan:  Formatting of this note might be different from the original. Has had prostate biopsy Elevated PSA Follows up with Urologist - under PSA surveillance Did not tolerate Finasteride  Referred to Oncology for concern of pelvic mass   Pulmonary nodule 07/19/2017   Vitamin D  deficiency 06/05/2014   Last Assessment & Plan:  Formatting of this note might be different from the original. ERGOCALCIFEROL   X 12 WEEKS  [2] Past Surgical History: Procedure Laterality Date   COLONOSCOPY  10/05/2023   [facility]  03/2022   UPPER GASTROINTESTINAL ENDOSCOPY  2018   2018  [3] No family history on file. [4] Allergies Allergen Reactions   Sulfa (Sulfonamide Antibiotics) Other (See Comments)    Unknown reaction. Childhood reaction  [5] Patient Active Problem List Diagnosis   GERD (gastroesophageal reflux disease)   Prostate cancer    (CMD)   Pulmonary nodule   Multiple myeloma (CMD)   Vitamin D  deficiency   Pre-transplant evaluation for stem cell  transplant   Autologous donor of stem cells   Benign prostatic hyperplasia with urinary frequency   Idiopathic peripheral neuropathy   Encounter for antineoplastic immunotherapy   Exposure to asbestos   History of DVT (deep vein thrombosis)   History of pulmonary embolism   Gastric outlet obstruction (CMD)

## 2025-01-09 ENCOUNTER — Ambulatory Visit: Payer: Self-pay | Admitting: Internal Medicine

## 2025-01-12 ENCOUNTER — Other Ambulatory Visit: Payer: Self-pay | Admitting: Internal Medicine

## 2025-01-13 NOTE — Progress Notes (Signed)
 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 Given the close proximity of the mass to the right kidney, very little of the right kidney could be avoided so attention was paid to minimizing any dose delivery to the left kidney.

## 2025-01-16 NOTE — Telephone Encounter (Signed)
 Patient has been hospitalized for a Gastric Outlet Obstruction and will need a follow up appointment in the oncology clinic.   Please schedule the following 2.5.2026  labs, clinic with either Dr. Fernande or one of the [facility] APP's.   (Cbc with Diff, CMP, Mag)

## 2025-01-16 NOTE — Progress Notes (Signed)
 ------------------------------------------------------------------------------- Attestation signed by Ronal Landry Muscat, MD at 01/16/2025  8:56 PM BMT ATTENDING NOTE: daily visit with Dr. Randol.  I have personally interviewed and evaluated Luis Stewart. I have reviewed the lab and culture data, reviewed the x-rays and available reports, reviewed medications with the pharmacist, reviewed patient data with the bedside RN and discussed the plan for management with the medical team. I agree with findings and plan as per above.  Nausea is better controlled today with scheduled Zofran . Tube feeds continue and he is being cautious with PO intake. Will update rad onc.   Will continue to monitor vital signs, laboratory data, ongoing problems, transfuse, and replace support as appropriate.   Electronically signed by:  Ronal Landry Muscat, MD 01/16/2025 8:55 PM    -------------------------------------------------------------------------------  BMT Progress Note  Chief Complaint: Nausea, Vomiting  Review of Systems Pertinent items are noted in HPI.  History of Present Illness: Luis Stewart is a 74 year old male with known history of  of IgG Kappa Multiple Myeloma, metastatic prostate cancer (bone mets, current treatment with ADT), HLD, GERD, and recent diagnosis of retroperitoneal mass with obstruction of stomach and duodenal bulb (pending biopsy) who presents as transfer from OSH for further management of gastric outlet obstruction.   Per chart review, patient presented to OSH ED on 01/04/25 with complaint of nausea and vomiting x 4 days prior to presentation. Lab work notable for creatinine of 1.37. CT ab/pelvis obtained which revealed mechanical gastric proximal duodenal obstruction at the 2nd and 3rd portions of the duodenum. He was given zofran  and IV fluids. He was admitted to OSH for further management of gastric outlet obstruction. While admitted,  GI consulted and he underwent EGD on  01/04/25 which revealed extrinsic compression of duodenum with markedly edematous mucosa. Biopsy of the duodenum and gastric antrum were taken. Post procedure he was started on a clear liquid diet and then a full liquid diet. He had difficulty tolerating advanced diet, so general surgery was consulted. Given patient receives cancer care at [facility], he was transferred for further management of gastric outlet obstruction.   Subjective: No acute events overnight. Since yesterday, patient has not had nausea or vomiting. He only ate applesauce yesterday. He did not endorse other complaints.  Nursing staff notified me that he had a run of V tach this morning but was asymptomatic.   Oncology History  Multiple myeloma (CMD)  04/09/2022 Initial Diagnosis   Multiple myeloma (HCC)   11/28/2022 - 12/02/2022 Chemotherapy   OP ONC Supportive Filgrastim  (ZARXIO ) / Plerixafor  (MOZOBIL ) for mobilization Plan Provider: Norleen Debby Funk, DO Treatment goal: Supportive Line of treatment: Transplant   01/08/2023 - 01/12/2023 Chemotherapy   OP ONC Supportive Filgrastim  (Zarxio ) / Plerixafor  (MOZOBIL ) for mobilization Plan Provider: Norleen Debby Funk, DO Treatment goal: Control Line of treatment: Transplant   10/10/2024 -  Supportive Treatment   Adult Peripheral IV and Central Venous Access Orders & Alteplase  PRN Plan Provider: Norleen Debby Funk, MD   11/09/2024 - 11/09/2024 Supportive Treatment   BMT Apheresis Un-Mobilized Collection Orders Plan Provider: Norleen Debby Funk, MD   11/24/2024 -  Supportive Treatment   BMT Apheresis Un-Mobilized Collection Orders Plan Provider: Norleen Debby Funk, MD   01/23/2025 -  Oncology Treatment   Protocol TCT - WF Carvykti - Lymphodepleting Fludarabine / Cyclophosphamide for CAR-T Ciltacabtagene Autoleucel (Multiple Myeloma)  Chemotherapy/Immunotherapy Medications ciltacabtagene autoleucel (CARVYKTI) infusion 1 Dose, 1 Dose, intravenous fludarabine (FLUDARA) 63.75 mg in  sodium chloride  0.9 % 102.6 mL chemo IVPB,  30 mg/m2 = 63.75 mg, intravenous cycloPHOSphamide (CYTOXAN) 636 mg in sodium chloride  0.9 % 281.8 mL chemo IVPB, 300 mg/m2 = 636 mg, intravenous  [Plan is still active]     Medical History[1]  Surgical History[2]  Family History[3]  Social History   Socioeconomic History   Marital status: Married    Spouse name: Not on file   Number of children: Not on file   Years of education: Not on file   Highest education level: Not on file  Occupational History   Not on file  Tobacco Use   Smoking status: Never   Smokeless tobacco: Never  Vaping Use   Vaping status: Never Used  Substance and Sexual Activity   Alcohol use: Not Currently   Drug use: Never   Sexual activity: Not Currently  Other Topics Concern   Not on file  Social History Narrative   Not on file   Social Drivers of Health   Living Situation: Low Risk (01/06/2025)   Living Situation    What is your living situation today?: I have a steady place to live    Think about the place you live. Do you have problems with any of the following? Choose all that apply:: None/None on this list  Food Insecurity: Low Risk (01/06/2025)   Food vital sign    Within the past 12 months, you worried that your food would run out before you got money to buy more: Never true    Within the past 12 months, the food you bought just didn't last and you didn't have money to get more: Never true  Transportation Needs: No Transportation Needs (01/06/2025)   Transportation    In the past 12 months, has lack of reliable transportation kept you from medical appointments, meetings, work or from getting things needed for daily living? : No  Utilities: Low Risk (01/06/2025)   Utilities    In the past 12 months has the electric, gas, oil, or water  company threatened to shut off services in your home? : No  Safety: Low Risk (01/06/2025)   Safety    How often does anyone, including family and  friends, physically hurt you?: Never    How often does anyone, including family and friends, insult or talk down to you?: Never    How often does anyone, including family and friends, threaten you with harm?: Never    How often does anyone, including family and friends, scream or curse at you?: Never  Alcohol Screening: Not on file  Tobacco Use: Low Risk (01/10/2025)   Patient History    Smoking Tobacco Use: Never    Smokeless Tobacco Use: Never    Passive Exposure: Not on file  Depression: Not At Risk (12/29/2024)   PHQ-2    PHQ-2 Score: 0  Social Connections: Unknown (01/06/2025)   Social Connection and Isolation Panel    Frequency of Communication with Friends and Family: Not on file    Frequency of Social Gatherings with Friends and Family: Not on file    Attends Religious Services: Not on file    Active Member of Clubs or Organizations: Not on file    Attends Banker Meetings: Not on file    Marital Status: Married  Physicist, Medical Strain: Low Risk (01/06/2025)   Overall Financial Resource Strain (CARDIA)    Difficulty of Paying Living Expenses: Not hard at all    Social Drivers of Health   Living Situation: Low Risk (01/06/2025)   Living Situation  What is your living situation today?: I have a steady place to live    Think about the place you live. Do you have problems with any of the following? Choose all that apply:: None/None on this list  Food Insecurity: Low Risk (01/06/2025)   Food vital sign    Within the past 12 months, you worried that your food would run out before you got money to buy more: Never true    Within the past 12 months, the food you bought just didn't last and you didn't have money to get more: Never true  Transportation Needs: No Transportation Needs (01/06/2025)   Transportation    In the past 12 months, has lack of reliable transportation kept you from medical appointments, meetings, work or from getting things needed  for daily living? : No  Utilities: Low Risk (01/06/2025)   Utilities    In the past 12 months has the electric, gas, oil, or water  company threatened to shut off services in your home? : No  Alcohol Screening: Not on file  Tobacco Use: Low Risk (01/10/2025)   Patient History    Smoking Tobacco Use: Never    Smokeless Tobacco Use: Never    Passive Exposure: Not on file  Depression: Not At Risk (12/29/2024)   PHQ-2    PHQ-2 Score: 0    Allergies[4]  Prior to Admission medications  Medication  acetaminophen  (TYLENOL ) 500 mg tablet  acyclovir  (ZOVIRAX ) 400 mg tablet  cholecalciferol (VITAMIN D3) 1,000 unit (25 mcg) tablet  DULoxetine  (CYMBALTA ) 60 mg capsule  ferrous sulfate 325 mg (65 mg iron) EC tablet  lenalidomide  (REVLIMID ) 15 mg cap  multivitamin (Flintstones/Extra C) chewable tablet  nitroglycerin  (NITROSTAT ) 0.4 mg SL tablet  pantoprazole  (PROTONIX ) 40 mg EC tablet  potassium chloride  (KLOR-CON ) 10 mEq ER tablet  prochlorperazine  (COMPAZINE ) 10 mg tablet  Restasis  0.05 % ophthalmic emulsion  rosuvastatin  (CRESTOR ) 5 mg tablet    Objective:  Physical Exam BP 122/75 (BP Location: Left arm, Patient Position: Sitting)   Pulse 99   Temp 98.8 F (37.1 C) (Oral)   Resp 16   Ht 1.88 m (6' 2)   Wt 71.6 kg (157 lb 12.8 oz)   SpO2 97%   BMI 20.26 kg/m  General: Chronically ill appearing, cachetic male in no acute distress.   Head-Eyes-Ears-Nose-Throat: Atraumatic and normocephalic.  NJ tube in place.  Cardiovascular: Regular rate and regular rhythm without murmurs, rubs or gallops.  Chest/Lungs: Clear to auscultation throughout.   Extremities: No pitting lower extremity edema.  Neuro/Psych: Alert and awake.  Answering questions appropriately.     Labs: Recent Results (from the past 48 hours)  Ionized Calcium , Whole Blood   Collection Time: 01/14/25  1:31 PM  Result Value Ref Range   Ionized Calcium  0.99 (L) 1.15 - 1.33 mmol/L  Basic Metabolic Panel    Collection Time: 01/14/25  1:31 PM  Result Value Ref Range   Sodium 138 136 - 145 mmol/L   Potassium 3.5 3.5 - 5.1 mmol/L   Chloride 107 98 - 107 mmol/L   CO2 24 21 - 31 mmol/L   Anion Gap 7 6 - 14 mmol/L   Glucose, Random 160 (H) 70 - 99 mg/dL   Blood Urea  Nitrogen (BUN) 12 7 - 25 mg/dL   Creatinine 9.03 9.29 - 1.30 mg/dL   eGFR 83 >40 fO/fpw/8.26f7   Calcium  6.4 (L) 8.6 - 10.3 mg/dL   BUN/Creatinine Ratio    Magnesium    Collection Time: 01/14/25  1:31  PM  Result Value Ref Range   Magnesium  3.1 (H) 1.9 - 2.7 mg/dL  Phosphorus   Collection Time: 01/14/25  1:31 PM  Result Value Ref Range   Phosphorus 2.0 (L) 2.5 - 5.0 mg/dL  Ionized Calcium , Whole Blood   Collection Time: 01/14/25  7:37 PM  Result Value Ref Range   Ionized Calcium  1.10 (L) 1.15 - 1.33 mmol/L  Ionized Calcium , Whole Blood   Collection Time: 01/14/25 11:33 PM  Result Value Ref Range   Ionized Calcium  1.01 (L) 1.15 - 1.33 mmol/L  Comprehensive Metabolic Panel   Collection Time: 01/15/25  5:25 AM  Result Value Ref Range   Sodium 141 136 - 145 mmol/L   Potassium 3.9 3.5 - 5.1 mmol/L   Chloride 110 (H) 98 - 107 mmol/L   CO2 24 21 - 31 mmol/L   Anion Gap 7 6 - 14 mmol/L   Glucose, Random 144 (H) 70 - 99 mg/dL   Blood Urea  Nitrogen (BUN) 14 7 - 25 mg/dL   Creatinine 9.08 9.29 - 1.30 mg/dL   eGFR 88 >40 fO/fpw/8.26f7   Albumin 2.5 (L) 3.5 - 5.7 g/dL   Total Protein 4.3 (L) 6.4 - 8.9 g/dL   Bilirubin, Total 0.4 0.3 - 1.0 mg/dL   Alkaline Phosphatase (ALP) 68 34 - 104 U/L   Aspartate Aminotransferase (AST) 14 13 - 39 U/L   Alanine Aminotransferase (ALT) 15 7 - 52 U/L   Calcium  6.8 (L) 8.6 - 10.3 mg/dL   BUN/Creatinine Ratio     Corrected Calcium  8.0 mg/dL  Magnesium    Collection Time: 01/15/25  5:25 AM  Result Value Ref Range   Magnesium  1.7 (L) 1.9 - 2.7 mg/dL  Phosphorus   Collection Time: 01/15/25  5:25 AM  Result Value Ref Range   Phosphorus 3.0 2.5 - 5.0 mg/dL  Immunoglobulins, Quantitative, IgA,  IgG, IgM   Collection Time: 01/15/25  5:25 AM  Result Value Ref Range   Immunoglobulin A, Quantitative (IgA) 39 (L) 66 - 433 mg/dL   Immunoglobulin G, Quantitative (IgG) 294 (L) 635 - 1,741 mg/dL   Immunoglobulin M, Quantitative (IgM) 64 45 - 281 mg/dL  Free Kappa And Lambda Light Chains with Ratio, Quantitative   Collection Time: 01/15/25  5:25 AM  Result Value Ref Range   Free Kappa Light Chains, Serum 12.99 3.30 - 19.40 mg/L   Free Lambda Light Chains, Serum 12.28 5.71 - 26.30 mg/L   Kappa/Lambda Ratio, Serum 1.06 0.26 - 1.65  CBC with Differential   Collection Time: 01/15/25  5:25 AM  Result Value Ref Range   WBC 7.90 4.40 - 11.00 10*3/uL   RBC 2.84 (L) 4.50 - 5.90 10*6/uL   Hemoglobin 9.0 (L) 14.0 - 17.5 g/dL   Hematocrit 73.1 (L) 58.4 - 50.4 %   Mean Corpuscular Volume (MCV) 94.6 80.0 - 96.0 fL   Mean Corpuscular Hemoglobin (MCH) 31.6 27.5 - 33.2 pg   Mean Corpuscular Hemoglobin Conc (MCHC) 33.4 33.0 - 37.0 g/dL   Red Cell Distribution Width (RDW) 19.1 (H) 12.3 - 17.0 %   Platelet Count (PLT) 160 150 - 450 10*3/uL   Mean Platelet Volume (MPV) 9.7 6.8 - 10.2 fL   Neutrophils % 89 %   Lymphocytes % 3 %   Monocytes % 7 %   Eosinophils % 0 %   Basophils % 0 %   nRBC % 0 %   Neutrophils Absolute 7.10 1.80 - 7.80 10*3/uL   Lymphocytes # 0.20 (L) 1.00 - 4.80 10*3/uL  Monocytes # 0.60 0.00 - 0.80 10*3/uL   Eosinophils # 0.00 0.00 - 0.50 10*3/uL   Basophils # 0.00 0.00 - 0.20 10*3/uL   nRBC Absolute 0.00 <=0.00 10*3/uL  Ionized Calcium , Whole Blood   Collection Time: 01/15/25  5:25 AM  Result Value Ref Range   Ionized Calcium  1.05 (L) 1.15 - 1.33 mmol/L  Ionized Calcium , Whole Blood   Collection Time: 01/15/25 10:03 PM  Result Value Ref Range   Ionized Calcium  1.01 (L) 1.15 - 1.33 mmol/L  Comprehensive Metabolic Panel   Collection Time: 01/16/25  5:08 AM  Result Value Ref Range   Sodium 144 136 - 145 mmol/L   Potassium 3.9 3.5 - 5.1 mmol/L   Chloride 110 (H) 98 - 107  mmol/L   CO2 26 21 - 31 mmol/L   Anion Gap 8 6 - 14 mmol/L   Glucose, Random 136 (H) 70 - 99 mg/dL   Blood Urea  Nitrogen (BUN) 15 7 - 25 mg/dL   Creatinine 9.05 9.29 - 1.30 mg/dL   eGFR 85 >40 fO/fpw/8.26f7   Albumin 2.4 (L) 3.5 - 5.7 g/dL   Total Protein 4.2 (L) 6.4 - 8.9 g/dL   Bilirubin, Total 0.3 0.3 - 1.0 mg/dL   Alkaline Phosphatase (ALP) 61 34 - 104 U/L   Aspartate Aminotransferase (AST) 11 (L) 13 - 39 U/L   Alanine Aminotransferase (ALT) 13 7 - 52 U/L   Calcium  6.6 (L) 8.6 - 10.3 mg/dL   BUN/Creatinine Ratio     Corrected Calcium  7.9 mg/dL  Magnesium    Collection Time: 01/16/25  5:08 AM  Result Value Ref Range   Magnesium  1.4 (L) 1.9 - 2.7 mg/dL  Phosphorus   Collection Time: 01/16/25  5:08 AM  Result Value Ref Range   Phosphorus 2.2 (L) 2.5 - 5.0 mg/dL  CBC with Differential   Collection Time: 01/16/25  5:08 AM  Result Value Ref Range   WBC 7.40 4.40 - 11.00 10*3/uL   RBC 2.79 (L) 4.50 - 5.90 10*6/uL   Hemoglobin 8.5 (L) 14.0 - 17.5 g/dL   Hematocrit 73.5 (L) 58.4 - 50.4 %   Mean Corpuscular Volume (MCV) 94.8 80.0 - 96.0 fL   Mean Corpuscular Hemoglobin (MCH) 30.5 27.5 - 33.2 pg   Mean Corpuscular Hemoglobin Conc (MCHC) 32.2 (L) 33.0 - 37.0 g/dL   Red Cell Distribution Width (RDW) 19.3 (H) 12.3 - 17.0 %   Platelet Count (PLT) 137 (L) 150 - 450 10*3/uL   Mean Platelet Volume (MPV) 8.8 6.8 - 10.2 fL   Neutrophils % 87 %   Lymphocytes % 3 %   Monocytes % 9 %   Eosinophils % 0 %   Basophils % 0 %   nRBC % 0 %   Neutrophils Absolute 6.50 1.80 - 7.80 10*3/uL   Lymphocytes # 0.20 (L) 1.00 - 4.80 10*3/uL   Monocytes # 0.70 0.00 - 0.80 10*3/uL   Eosinophils # 0.00 0.00 - 0.50 10*3/uL   Basophils # 0.00 0.00 - 0.20 10*3/uL   nRBC Absolute 0.00 <=0.00 10*3/uL    Radiology Results (last 72 hours)     Procedure Component Value Units Date/Time   CT Abdomen Pelvis W IV Only [[id number]] Resulted: 01/03/25 2356   Order Status: Completed Updated: 01/04/25 1408    Narrative:     EXAM:CT ABDOMEN AND PELVIS WITH CONTRAST01/13/2026 11:29:28 PMTECHNIQUE:CT of the abdomen and pelvis was performed with the administration ofintravenous contrast. Multiplanar reformatted images are provided for review.Automated exposure control, iterative reconstruction, and/or weight-basedadjustment of  the mA/kV was utilized to reduce the radiation dose to as low asreasonably achievable. 100 mL of iohexol  (OMNIPAQUE ) 300 MG/ML solution wasadministered.COMPARISON:12/19/2025CLINICAL HISTORY:Bowel obstruction. Nausea, abdominal pain, vomiting.FINDINGS:LOWER CHEST:Bilateral posterior basal consolidation and superimposed atelectasis suggestiveof subacute infection or aspiration.LIVER:Innumerable simple cysts are noted throughout the liver. No enhancingintrahepatic mass. No intra or extrahepatic biliary ductal dilation.GALLBLADDER AND BILE DUCTS:Gallbladder is unremarkable. No biliary ductal dilatation.SPLEEN:No acute abnormality.PANCREAS:No acute abnormality.ADRENAL GLANDS:No acute abnormality.KIDNEYS, URETERS AND BLADDER:A simple cortical cyst is present within the right kidney, for which nofollow-up imaging is recommended. No stones in the kidneys or ureters. Nohydronephrosis. No perinephric or periureteral stranding. The bladder is notdistended.GI AND BOWEL:Duodenal malrotation is present. The stomach and proximal duodenum arefluid-filled and mildly dilated, similar to prior examination, with an abrupttransition at the juncture of the second and third portion of the duodenumwhere this appears encased by an infiltrative process described below.Mechanical obstruction secondary to the infiltrative process is suspected. Thestomach, small bowel, and large bowel are otherwise unremarkable. The appendixis normal.PERITONEUM AND RETROPERITONEUM:An infiltrative soft tissue process encasing the right renal vasculature andextending from the interval region of the right kidney to encase the junctureof the second  and third portion of the duodenum is again identified and isgrossly unchanged, measuring roughly 5.5 x 5.7 cm (volume 89.9 cm3, calculatedas 5.5 x 5.7 x 5.7 x 0.52). Differential considerations should includeneoplastic processes such as lymphoma or inflammatory conditions such as IgG4related disease or sarcoidosis. No ascites. No free air.VASCULATURE:Aorta is normal in caliber. Mild aortoiliac atherosclerotic calcification. Noaortic aneurysm.LYMPH NODES:No lymphadenopathy.REPRODUCTIVE ORGANS:The prostate gland is massively enlarged. A 60 mm hypodense nodule within theright peripheral zone is nonspecific; however, a focal neoplasm is not excludedand correlation with serum PSA level is recommended. Prostate MRI examinationwould also be helpful for further evaluation.BONES AND SOFT TISSUES:Changes of Paget's disease of the bone are noted involving the sacrum . Similarchanges are noted within the left pubic symphysis. No suspicious lytic orblastic bone lesions are seen. No focal soft tissue abnormality.IMPRESSION:1. Mechanical gastric and proximal duodenal obstruction at the junction of thesecond and third portions of the duodenum, related to the infiltrative processdescribed below.2. Infiltrative soft tissue encasing the right renal vasculature and extendingto the duodenum, grossly unchanged, measuring roughly 5.5 x 5.7 cm, withdifferential considerations including lymphoma and inflammatory conditions suchas IgG4-related disease or sarcoidosis. Correlation with serum laboratoryexamination including total IgG level may be helpful for further evaluation3. Bilateral posterior basal consolidation and superimposed atelectasis,suggestive of subacute infection or aspiration. 4. Massive prostatomegaly with a 16 mm hypodense nodule in the right peripheralzone, with focal neoplasm not excluded, with PSA evaluation and prostate MRIsuggested for further assessment.5. Duodenal malrotation.6. Innumerable simple hepatic cysts in  keeping with polycystic liver disease .7. 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 Raf score includes aortic atherosclerosis (ICD10-I70.0).Electronically signed by: Dorethia Molt MD 01/03/2025 11:56 PM EST RPWorkstation: [id number]       Review of Ancillary Data: I have obtained additional historical information from a family member or outside physician. 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 Retroperitoneal mass initially incidentally found on CT ab/pelvis which was obtained on 12/09/24 for abdominal pain and diarrhea. He was seen by IR at [facility] on 12/26/24  for image guided biopsy however there was no safe percutaneous window identified so biopsy was not attempted. Referral was made at [facility] for EGD/EUS with biopsy which was scheduled for 01/12/25. 01/16 - Admit as transfer from [facility]. Consult surgical oncology for biopsy and gastric outlet obstruction management. NPO. PRN zofran  and compazine  for nausea/vomiting. F/u gastric and duodenal biopsies taken at [facility] on 12/25/24. Send LDH and uric acid.  01/17:  Surg onc consulted, KUB to confirm NGT placement and if confirmed then can do LIS.  SurgOnc recc advanced GI consulted for consideration of transduodenal biopsy. Called [facility] and asked them to push imaging to [facility]. Follow up IgG 4 class subtypes.   01/18: Ng tube was placed to suction overnight. Gastric and duodenal biopsies from 01/04/2025 were negative for dysplasia or malignancy.   01/20: S/p EUS with biopsy of RP mass , no stent placement was needed, GI  placed NJ tube and patient started clear liquid diet 01/21: IGG-4 subclass resulted and negative, consulted nutrition and patient was started on tube feeds. GI recommended advancing diet as tolerated.  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 Rad onc was consulted on 1/22 and plan for palliative radiation to relieve gastric outlet obstruction today, 1/23. On soft diet, will advance as tolerated. 1/24: S/P palliative radiation on 01/13/25. Remains on Osmolite with thin liquids/soft diet and NJ tube in place. 1/25: Reached out to nutrition to see if tube feeds could be adjusted to help relieve nausea. Since he was already at a low rate of 40/hr, he could not have tube feed rate decreased. Scheduled IV zofran  8 mg IV. Also spaced out iCa checks to q12 hours and he is otherwise having electrolytes checked daily now 01/26-Will see how patient is tolerating PO intake today now that IV zofran  is scheduled and reach out to radiation oncology to give them an update on his status  New segmental pulmonary emboli in right lower lobe and right middle lobe Right-sided pleuritic pain 01/18-Patient endorsed pleuritic pain on his right side since he was transferred via ambulance.  Ordered chest x-ray which showed a new patchy opacity in the right lower lung which could represent pneumonia.  He is currently afebrile.  If he were to spike fever will start treatment for hospital-acquired pneumonia. 01/19-Patient is still having pleuritic chest pain but remains afebrile.  CTA chest PE study ordered to further evaluate and rule out PE. 01/20- Due to new PE noted in RLL and RML, started eliquis  load 01/22-For pain, oxycodone  5 mg q6 hours prn and lidocaine  patch  Large volume aspiration in right lower lobe Large volume aspiration in right lower lobe noted on cta chest which could represent aspiration pneumonia vs. Pneumonitis. -Start treatment for aspiration pneumonia with  unasyn if he were to fever -01/21: Swallow evaluation : No overt signs of symptoms of aspiration  IgG Kappa MM current treatment with Kpd as bridging therapy to CAR-T (carvykti) Patient follows locally at [facility] with Dr. Davonna and was referred to Dr. Fernande for CAR-T evaluation. Last seen 12/29/24 with plans for CAR-T at the end of January 01/16 - Send myeloma serologies including immunoglobulins, free light chains, SPEP, and IFEX.  01/17: IgA 33, IgG 363, IgM 21. Kappa 11.44, Lambda 6.01. SPEP with M spike 0.21, IFE: monoclonal IgG kappa 01/25-Repeat IgG is 294, repeat multiple myeloma serology pending  Metastatic Prostate Cancer Patient follows with [facility]. Current treatment with Eligard  (ADT).  PSA: 0.26  Disease & Chemotherapy Induced Pancytopenia  Monitor counts daily.  Transfuse PRN per protocol. 01/21-Ordered anemia workup due to hgb down-trending and anemia profile is consistent with iron deficiency anemia. S/P ferric gluconate x 4 doses.  FEN D5 half NS at 100cc/hr while NPO (discontinued on 01/12/25).  Electrolytes per protocol.  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  On 01/18, switched to acyclovir  250 mg IV daily due to inability to take p.o. medications at this time (renally dosed).   VTE SCDs while in bed.  GI PPI IV in place of home prilosec.  Chronic Medical Conditions  HLD Continue home statin when able to take PO  Ethics Full code.    Electronically signed by:  Rella Fetters, MD 01/16/2025 9:22 AM            [1] Past Medical History: Diagnosis Date   Arthritis    Cancer    (CMD) 02/26/2022   Multiple Myeloma   GERD (gastroesophageal reflux disease) 10/15/2017   Last Assessment & Plan:  Formatting of this note might be different from the original. On Pantoprazole  40 mg QOD   Multiple myeloma (CMD) 04/09/2022   Neuromuscular disorder (CMD)    Prostate cancer    (CMD) 08/17/2015   Last Assessment & Plan:   Formatting of this note might be different from the original. Has had prostate biopsy Elevated PSA Follows up with Urologist - under PSA surveillance Did not tolerate Finasteride  Referred to Oncology for concern of pelvic mass   Pulmonary nodule 07/19/2017   Vitamin D  deficiency 06/05/2014   Last Assessment & Plan:  Formatting of this note might be different from the original. ERGOCALCIFEROL   X 12 WEEKS  [2] Past Surgical History: Procedure Laterality Date   COLONOSCOPY  10/05/2023   [facility]  03/2022   UPPER GASTROINTESTINAL ENDOSCOPY  2018   2018  [3] No family history on file. [4] Allergies Allergen Reactions   Sulfa (Sulfonamide Antibiotics) Other (See Comments)    Unknown reaction. Childhood reaction  [5] Patient Active Problem List Diagnosis   GERD (gastroesophageal reflux disease)   Prostate cancer    (CMD)   Pulmonary nodule   Multiple myeloma (CMD)   Vitamin D  deficiency   Pre-transplant evaluation for stem cell transplant   Autologous donor of stem cells   Benign prostatic hyperplasia with urinary frequency   Idiopathic peripheral neuropathy   Encounter for antineoplastic immunotherapy   Exposure to asbestos   History of DVT (deep vein thrombosis)   History of pulmonary embolism   Gastric outlet obstruction (CMD)   Retroperitoneal mass

## 2025-01-20 NOTE — Telephone Encounter (Signed)
 Patient will discharge from the hospital over the next few days. His treatment plan has had Pegfilgrastim Injection added to it therefore he will need to have a change in his scheduled appointment. (Pegfilgrastim will be due on 2.2.26 to 2.4.2026 per pharmacy)  Please reschedule the 2.5.26 labs and Clinic with Dr. Fernande appointments to the following:  2.3.2026 Labs clinic with Dr. Fernande and tx= Pegfilgrastim.   Josette [phone]

## 2025-01-24 NOTE — Group Note (Signed)
 Inpatient Care Coordination Team Conference Note  01/24/2025   Time:8:39 AM   CSN: [id number]  DOB: 05-06-1951   Room/Bed: C809/A LOS: 18 Payor Info: Payor: [employer] MED[employer]ARE ADV / Plan: [employer] [place] / Product Type: Medicare Advantage /    Admitting Diagnosis: Gastric outlet obstruction (CMD) [K31.1]  Admit Date/Time: 01/06/2025  8:14 PM Admission Comments: No comment available   Primary Diagnosis: Gastric outlet obstruction (CMD) Principal Problem: Gastric outlet obstruction (CMD)  Predictive Model Details        26.7% (Medium)  Factor Value   Calculated 01/24/2025 08:05 13% Number of appointments in last 90 days 22   Readmission Risk Score v2 Model 10% Number of ED visits in last 90 days 0    9% Braden score 15    8% Number of hospitalizations in last year 0    7% Latest hemoglobin in last 72 hrs 8.3 g/dL     Team Members Present: Case Manager, Advanced Practice Provider  Expected Discharge Date: Feb 10, 2025  Sotero DELENA Pilgrim, RN

## 2025-02-02 ENCOUNTER — Inpatient Hospital Stay: Attending: Hematology

## 2025-02-02 ENCOUNTER — Inpatient Hospital Stay: Admitting: Oncology

## 2025-02-09 ENCOUNTER — Ambulatory Visit: Admitting: Internal Medicine

## 2025-02-22 ENCOUNTER — Encounter: Admitting: Internal Medicine

## 2025-03-15 ENCOUNTER — Ambulatory Visit

## 2025-04-10 ENCOUNTER — Encounter: Admitting: Internal Medicine

## 2025-07-05 ENCOUNTER — Ambulatory Visit: Admitting: Urology
